# Patient Record
Sex: Female | Born: 1945
Health system: Southern US, Community
[De-identification: ages and names within clinical notes are randomized; demographics above are authoritative.]

## PROBLEM LIST (undated history)

## (undated) DIAGNOSIS — Z8489 Family history of other specified conditions: Secondary | ICD-10-CM

## (undated) DIAGNOSIS — H1851 Endothelial corneal dystrophy: Secondary | ICD-10-CM

## (undated) DIAGNOSIS — H18519 Endothelial corneal dystrophy, unspecified eye: Secondary | ICD-10-CM

## (undated) DIAGNOSIS — M1990 Unspecified osteoarthritis, unspecified site: Secondary | ICD-10-CM

## (undated) DIAGNOSIS — E785 Hyperlipidemia, unspecified: Secondary | ICD-10-CM

## (undated) DIAGNOSIS — E039 Hypothyroidism, unspecified: Secondary | ICD-10-CM

## (undated) DIAGNOSIS — R0600 Dyspnea, unspecified: Secondary | ICD-10-CM

## (undated) DIAGNOSIS — K219 Gastro-esophageal reflux disease without esophagitis: Secondary | ICD-10-CM

## (undated) DIAGNOSIS — J449 Chronic obstructive pulmonary disease, unspecified: Secondary | ICD-10-CM

## (undated) DIAGNOSIS — I10 Essential (primary) hypertension: Secondary | ICD-10-CM

## (undated) DIAGNOSIS — J189 Pneumonia, unspecified organism: Secondary | ICD-10-CM

## (undated) DIAGNOSIS — N189 Chronic kidney disease, unspecified: Secondary | ICD-10-CM

## (undated) DIAGNOSIS — Z9889 Other specified postprocedural states: Secondary | ICD-10-CM

## (undated) DIAGNOSIS — R112 Nausea with vomiting, unspecified: Secondary | ICD-10-CM

## (undated) HISTORY — PX: APPENDECTOMY: SHX54

## (undated) HISTORY — DX: Chronic kidney disease, unspecified: N18.9

## (undated) HISTORY — PX: EYE SURGERY: SHX253

## (undated) HISTORY — PX: ABDOMINAL HYSTERECTOMY: SHX81

## (undated) HISTORY — PX: FOOT SURGERY: SHX648

## (undated) HISTORY — PX: OTHER SURGICAL HISTORY: SHX169

## (undated) HISTORY — DX: Chronic obstructive pulmonary disease, unspecified: J44.9

## (undated) HISTORY — PX: CHOLECYSTECTOMY: SHX55

## (undated) HISTORY — DX: Essential (primary) hypertension: I10

## (undated) HISTORY — DX: Hyperlipidemia, unspecified: E78.5

## (undated) HISTORY — DX: Endothelial corneal dystrophy: H18.51

## (undated) HISTORY — DX: Endothelial corneal dystrophy, unspecified eye: H18.519

## (undated) HISTORY — PX: TONSILLECTOMY: SUR1361

## (undated) HISTORY — PX: CARDIAC CATHETERIZATION: SHX172

## (undated) HISTORY — PX: NASAL SINUS SURGERY: SHX719

---

## 2006-10-06 ENCOUNTER — Ambulatory Visit: Payer: Self-pay | Admitting: Family Medicine

## 2007-04-02 ENCOUNTER — Other Ambulatory Visit: Payer: Self-pay

## 2007-04-03 ENCOUNTER — Inpatient Hospital Stay: Payer: Self-pay | Admitting: Surgery

## 2008-10-15 ENCOUNTER — Ambulatory Visit: Payer: Self-pay

## 2009-07-09 ENCOUNTER — Ambulatory Visit: Payer: Self-pay | Admitting: Ophthalmology

## 2009-08-06 ENCOUNTER — Ambulatory Visit: Payer: Self-pay | Admitting: Family Medicine

## 2010-12-22 ENCOUNTER — Ambulatory Visit: Payer: Self-pay | Admitting: Family Medicine

## 2011-11-03 ENCOUNTER — Ambulatory Visit: Payer: Self-pay | Admitting: Unknown Physician Specialty

## 2011-11-08 LAB — PATHOLOGY REPORT

## 2012-07-05 ENCOUNTER — Ambulatory Visit: Payer: Self-pay | Admitting: Ophthalmology

## 2012-11-02 DIAGNOSIS — J301 Allergic rhinitis due to pollen: Secondary | ICD-10-CM | POA: Diagnosis not present

## 2012-11-16 DIAGNOSIS — R7989 Other specified abnormal findings of blood chemistry: Secondary | ICD-10-CM | POA: Diagnosis not present

## 2013-03-13 DIAGNOSIS — I10 Essential (primary) hypertension: Secondary | ICD-10-CM | POA: Diagnosis not present

## 2013-03-13 DIAGNOSIS — E7800 Pure hypercholesterolemia, unspecified: Secondary | ICD-10-CM | POA: Diagnosis not present

## 2013-03-13 DIAGNOSIS — M810 Age-related osteoporosis without current pathological fracture: Secondary | ICD-10-CM | POA: Diagnosis not present

## 2013-03-13 DIAGNOSIS — J449 Chronic obstructive pulmonary disease, unspecified: Secondary | ICD-10-CM | POA: Diagnosis not present

## 2013-03-13 DIAGNOSIS — Z23 Encounter for immunization: Secondary | ICD-10-CM | POA: Diagnosis not present

## 2013-03-13 DIAGNOSIS — Z0000 Encounter for general adult medical examination without abnormal findings: Secondary | ICD-10-CM | POA: Diagnosis not present

## 2013-03-19 ENCOUNTER — Ambulatory Visit: Payer: Self-pay | Admitting: Family Medicine

## 2013-03-19 DIAGNOSIS — Z136 Encounter for screening for cardiovascular disorders: Secondary | ICD-10-CM | POA: Diagnosis not present

## 2013-04-25 DIAGNOSIS — E785 Hyperlipidemia, unspecified: Secondary | ICD-10-CM | POA: Diagnosis not present

## 2013-04-25 DIAGNOSIS — E7800 Pure hypercholesterolemia, unspecified: Secondary | ICD-10-CM | POA: Diagnosis not present

## 2013-04-25 DIAGNOSIS — I10 Essential (primary) hypertension: Secondary | ICD-10-CM | POA: Diagnosis not present

## 2013-04-26 DIAGNOSIS — I10 Essential (primary) hypertension: Secondary | ICD-10-CM | POA: Diagnosis not present

## 2013-05-17 DIAGNOSIS — N183 Chronic kidney disease, stage 3 unspecified: Secondary | ICD-10-CM | POA: Diagnosis not present

## 2013-06-04 DIAGNOSIS — E785 Hyperlipidemia, unspecified: Secondary | ICD-10-CM | POA: Diagnosis not present

## 2013-06-04 DIAGNOSIS — E7800 Pure hypercholesterolemia, unspecified: Secondary | ICD-10-CM | POA: Diagnosis not present

## 2013-06-04 DIAGNOSIS — N183 Chronic kidney disease, stage 3 unspecified: Secondary | ICD-10-CM | POA: Diagnosis not present

## 2013-06-04 DIAGNOSIS — I10 Essential (primary) hypertension: Secondary | ICD-10-CM | POA: Diagnosis not present

## 2013-06-28 DIAGNOSIS — I10 Essential (primary) hypertension: Secondary | ICD-10-CM | POA: Diagnosis not present

## 2013-07-04 DIAGNOSIS — J018 Other acute sinusitis: Secondary | ICD-10-CM | POA: Diagnosis not present

## 2013-07-04 DIAGNOSIS — J301 Allergic rhinitis due to pollen: Secondary | ICD-10-CM | POA: Diagnosis not present

## 2013-07-16 DIAGNOSIS — N390 Urinary tract infection, site not specified: Secondary | ICD-10-CM | POA: Diagnosis not present

## 2013-07-16 DIAGNOSIS — D1800 Hemangioma unspecified site: Secondary | ICD-10-CM | POA: Diagnosis not present

## 2013-07-16 DIAGNOSIS — L719 Rosacea, unspecified: Secondary | ICD-10-CM | POA: Diagnosis not present

## 2013-07-16 DIAGNOSIS — L819 Disorder of pigmentation, unspecified: Secondary | ICD-10-CM | POA: Diagnosis not present

## 2013-07-16 DIAGNOSIS — L919 Hypertrophic disorder of the skin, unspecified: Secondary | ICD-10-CM | POA: Diagnosis not present

## 2013-07-16 DIAGNOSIS — L821 Other seborrheic keratosis: Secondary | ICD-10-CM | POA: Diagnosis not present

## 2013-07-16 DIAGNOSIS — B351 Tinea unguium: Secondary | ICD-10-CM | POA: Diagnosis not present

## 2013-07-16 DIAGNOSIS — L909 Atrophic disorder of skin, unspecified: Secondary | ICD-10-CM | POA: Diagnosis not present

## 2013-07-16 DIAGNOSIS — D692 Other nonthrombocytopenic purpura: Secondary | ICD-10-CM | POA: Diagnosis not present

## 2013-08-29 DIAGNOSIS — N952 Postmenopausal atrophic vaginitis: Secondary | ICD-10-CM | POA: Diagnosis not present

## 2013-08-29 DIAGNOSIS — Z01419 Encounter for gynecological examination (general) (routine) without abnormal findings: Secondary | ICD-10-CM | POA: Diagnosis not present

## 2013-08-29 DIAGNOSIS — Z1211 Encounter for screening for malignant neoplasm of colon: Secondary | ICD-10-CM | POA: Diagnosis not present

## 2013-08-29 DIAGNOSIS — R87615 Unsatisfactory cytologic smear of cervix: Secondary | ICD-10-CM | POA: Diagnosis not present

## 2013-08-29 DIAGNOSIS — N951 Menopausal and female climacteric states: Secondary | ICD-10-CM | POA: Diagnosis not present

## 2013-09-03 DIAGNOSIS — Z1231 Encounter for screening mammogram for malignant neoplasm of breast: Secondary | ICD-10-CM | POA: Diagnosis not present

## 2013-09-13 DIAGNOSIS — N183 Chronic kidney disease, stage 3 unspecified: Secondary | ICD-10-CM | POA: Diagnosis not present

## 2013-09-13 DIAGNOSIS — I10 Essential (primary) hypertension: Secondary | ICD-10-CM | POA: Diagnosis not present

## 2013-10-16 DIAGNOSIS — I10 Essential (primary) hypertension: Secondary | ICD-10-CM | POA: Diagnosis not present

## 2013-11-08 DIAGNOSIS — J305 Allergic rhinitis due to food: Secondary | ICD-10-CM | POA: Diagnosis not present

## 2013-11-08 DIAGNOSIS — J301 Allergic rhinitis due to pollen: Secondary | ICD-10-CM | POA: Diagnosis not present

## 2014-01-02 DIAGNOSIS — N183 Chronic kidney disease, stage 3 unspecified: Secondary | ICD-10-CM | POA: Diagnosis not present

## 2014-01-02 DIAGNOSIS — I10 Essential (primary) hypertension: Secondary | ICD-10-CM | POA: Diagnosis not present

## 2014-01-17 DIAGNOSIS — R059 Cough, unspecified: Secondary | ICD-10-CM | POA: Diagnosis not present

## 2014-01-17 DIAGNOSIS — J305 Allergic rhinitis due to food: Secondary | ICD-10-CM | POA: Diagnosis not present

## 2014-01-17 DIAGNOSIS — R05 Cough: Secondary | ICD-10-CM | POA: Diagnosis not present

## 2014-01-17 DIAGNOSIS — J328 Other chronic sinusitis: Secondary | ICD-10-CM | POA: Diagnosis not present

## 2014-02-13 DIAGNOSIS — Z23 Encounter for immunization: Secondary | ICD-10-CM | POA: Diagnosis not present

## 2014-02-21 DIAGNOSIS — I10 Essential (primary) hypertension: Secondary | ICD-10-CM | POA: Diagnosis not present

## 2014-02-28 DIAGNOSIS — Z23 Encounter for immunization: Secondary | ICD-10-CM | POA: Diagnosis not present

## 2014-03-04 DIAGNOSIS — H26492 Other secondary cataract, left eye: Secondary | ICD-10-CM | POA: Diagnosis not present

## 2014-03-21 DIAGNOSIS — I10 Essential (primary) hypertension: Secondary | ICD-10-CM | POA: Diagnosis not present

## 2014-03-21 DIAGNOSIS — N183 Chronic kidney disease, stage 3 (moderate): Secondary | ICD-10-CM | POA: Diagnosis not present

## 2014-03-21 DIAGNOSIS — E039 Hypothyroidism, unspecified: Secondary | ICD-10-CM | POA: Diagnosis not present

## 2014-03-21 DIAGNOSIS — Z0000 Encounter for general adult medical examination without abnormal findings: Secondary | ICD-10-CM | POA: Diagnosis not present

## 2014-03-21 DIAGNOSIS — E785 Hyperlipidemia, unspecified: Secondary | ICD-10-CM | POA: Diagnosis not present

## 2014-03-25 DIAGNOSIS — E2839 Other primary ovarian failure: Secondary | ICD-10-CM | POA: Diagnosis not present

## 2014-03-25 DIAGNOSIS — M8588 Other specified disorders of bone density and structure, other site: Secondary | ICD-10-CM | POA: Diagnosis not present

## 2014-03-25 DIAGNOSIS — N958 Other specified menopausal and perimenopausal disorders: Secondary | ICD-10-CM | POA: Diagnosis not present

## 2014-03-25 DIAGNOSIS — Z13820 Encounter for screening for osteoporosis: Secondary | ICD-10-CM | POA: Diagnosis not present

## 2014-04-01 DIAGNOSIS — I10 Essential (primary) hypertension: Secondary | ICD-10-CM | POA: Diagnosis not present

## 2014-04-01 DIAGNOSIS — M549 Dorsalgia, unspecified: Secondary | ICD-10-CM | POA: Diagnosis not present

## 2014-04-09 DIAGNOSIS — J0101 Acute recurrent maxillary sinusitis: Secondary | ICD-10-CM | POA: Diagnosis not present

## 2014-04-24 DIAGNOSIS — E785 Hyperlipidemia, unspecified: Secondary | ICD-10-CM | POA: Diagnosis not present

## 2014-04-24 DIAGNOSIS — I10 Essential (primary) hypertension: Secondary | ICD-10-CM | POA: Diagnosis not present

## 2014-04-24 DIAGNOSIS — N183 Chronic kidney disease, stage 3 (moderate): Secondary | ICD-10-CM | POA: Diagnosis not present

## 2014-04-26 DIAGNOSIS — B379 Candidiasis, unspecified: Secondary | ICD-10-CM | POA: Diagnosis not present

## 2014-04-29 DIAGNOSIS — M545 Low back pain: Secondary | ICD-10-CM | POA: Diagnosis not present

## 2014-05-02 DIAGNOSIS — M1000 Idiopathic gout, unspecified site: Secondary | ICD-10-CM | POA: Diagnosis not present

## 2014-06-10 DIAGNOSIS — H26492 Other secondary cataract, left eye: Secondary | ICD-10-CM | POA: Diagnosis not present

## 2014-06-24 DIAGNOSIS — Z1211 Encounter for screening for malignant neoplasm of colon: Secondary | ICD-10-CM | POA: Diagnosis not present

## 2014-06-26 DIAGNOSIS — N183 Chronic kidney disease, stage 3 (moderate): Secondary | ICD-10-CM | POA: Diagnosis not present

## 2014-06-26 DIAGNOSIS — M545 Low back pain: Secondary | ICD-10-CM | POA: Diagnosis not present

## 2014-06-26 DIAGNOSIS — I10 Essential (primary) hypertension: Secondary | ICD-10-CM | POA: Diagnosis not present

## 2014-07-22 DIAGNOSIS — I789 Disease of capillaries, unspecified: Secondary | ICD-10-CM | POA: Diagnosis not present

## 2014-07-22 DIAGNOSIS — L219 Seborrheic dermatitis, unspecified: Secondary | ICD-10-CM | POA: Diagnosis not present

## 2014-07-22 DIAGNOSIS — L821 Other seborrheic keratosis: Secondary | ICD-10-CM | POA: Diagnosis not present

## 2014-07-22 DIAGNOSIS — L859 Epidermal thickening, unspecified: Secondary | ICD-10-CM | POA: Diagnosis not present

## 2014-07-22 DIAGNOSIS — L820 Inflamed seborrheic keratosis: Secondary | ICD-10-CM | POA: Diagnosis not present

## 2014-07-22 DIAGNOSIS — L718 Other rosacea: Secondary | ICD-10-CM | POA: Diagnosis not present

## 2014-07-22 DIAGNOSIS — Z1283 Encounter for screening for malignant neoplasm of skin: Secondary | ICD-10-CM | POA: Diagnosis not present

## 2014-07-22 DIAGNOSIS — D1800 Hemangioma unspecified site: Secondary | ICD-10-CM | POA: Diagnosis not present

## 2014-07-22 DIAGNOSIS — B351 Tinea unguium: Secondary | ICD-10-CM | POA: Diagnosis not present

## 2014-09-04 DIAGNOSIS — Z01419 Encounter for gynecological examination (general) (routine) without abnormal findings: Secondary | ICD-10-CM | POA: Diagnosis not present

## 2014-09-04 DIAGNOSIS — N951 Menopausal and female climacteric states: Secondary | ICD-10-CM | POA: Diagnosis not present

## 2014-09-04 DIAGNOSIS — M810 Age-related osteoporosis without current pathological fracture: Secondary | ICD-10-CM | POA: Diagnosis not present

## 2014-09-04 DIAGNOSIS — N952 Postmenopausal atrophic vaginitis: Secondary | ICD-10-CM | POA: Diagnosis not present

## 2014-09-06 ENCOUNTER — Other Ambulatory Visit: Payer: Self-pay | Admitting: Obstetrics and Gynecology

## 2014-09-06 DIAGNOSIS — J301 Allergic rhinitis due to pollen: Secondary | ICD-10-CM | POA: Diagnosis not present

## 2014-09-06 DIAGNOSIS — R490 Dysphonia: Secondary | ICD-10-CM | POA: Diagnosis not present

## 2014-09-06 DIAGNOSIS — J060 Acute laryngopharyngitis: Secondary | ICD-10-CM | POA: Diagnosis not present

## 2014-09-06 DIAGNOSIS — Z01419 Encounter for gynecological examination (general) (routine) without abnormal findings: Secondary | ICD-10-CM

## 2014-09-10 DIAGNOSIS — Z1231 Encounter for screening mammogram for malignant neoplasm of breast: Secondary | ICD-10-CM | POA: Diagnosis not present

## 2014-09-12 DIAGNOSIS — I10 Essential (primary) hypertension: Secondary | ICD-10-CM | POA: Diagnosis not present

## 2014-09-12 DIAGNOSIS — M545 Low back pain: Secondary | ICD-10-CM | POA: Diagnosis not present

## 2014-09-12 DIAGNOSIS — E039 Hypothyroidism, unspecified: Secondary | ICD-10-CM | POA: Diagnosis not present

## 2014-09-12 DIAGNOSIS — E785 Hyperlipidemia, unspecified: Secondary | ICD-10-CM | POA: Diagnosis not present

## 2014-09-12 DIAGNOSIS — N183 Chronic kidney disease, stage 3 (moderate): Secondary | ICD-10-CM | POA: Diagnosis not present

## 2014-10-09 DIAGNOSIS — M79672 Pain in left foot: Secondary | ICD-10-CM | POA: Diagnosis not present

## 2014-10-09 DIAGNOSIS — M216X2 Other acquired deformities of left foot: Secondary | ICD-10-CM | POA: Diagnosis not present

## 2014-10-09 DIAGNOSIS — M2012 Hallux valgus (acquired), left foot: Secondary | ICD-10-CM | POA: Diagnosis not present

## 2014-10-23 DIAGNOSIS — I10 Essential (primary) hypertension: Secondary | ICD-10-CM | POA: Diagnosis not present

## 2014-10-23 DIAGNOSIS — N183 Chronic kidney disease, stage 3 (moderate): Secondary | ICD-10-CM | POA: Diagnosis not present

## 2014-10-23 DIAGNOSIS — E780 Pure hypercholesterolemia: Secondary | ICD-10-CM | POA: Diagnosis not present

## 2014-11-05 ENCOUNTER — Telehealth: Payer: Self-pay | Admitting: Family Medicine

## 2014-11-05 DIAGNOSIS — N390 Urinary tract infection, site not specified: Secondary | ICD-10-CM

## 2014-11-05 MED ORDER — CIPROFLOXACIN HCL 250 MG PO TABS: 250.0000 mg | ORAL_TABLET | Freq: Two times a day (BID) | ORAL | Status: DC

## 2014-11-05 NOTE — Telephone Encounter (Signed)
rx sent in 

## 2014-11-05 NOTE — Telephone Encounter (Signed)
PT CALLED IN THINKS SHE HAS UTI AND WOULD LIKE A CALL BACK

## 2014-11-06 ENCOUNTER — Telehealth: Payer: Self-pay | Admitting: Family Medicine

## 2014-11-06 MED ORDER — FLUCONAZOLE 150 MG PO TABS: 150.0000 mg | ORAL_TABLET | Freq: Once | ORAL | Status: DC

## 2014-11-06 NOTE — Telephone Encounter (Signed)
Add on diflucan to rx

## 2014-11-06 NOTE — Telephone Encounter (Signed)
Pt called would like a call back from Dr. Jeananne Rama. Please call her @ [phone]. Thanks.

## 2014-11-19 ENCOUNTER — Telehealth: Payer: Self-pay

## 2014-11-19 NOTE — Telephone Encounter (Signed)
[facility] requesting Levothyroxine 61mcg

## 2014-11-20 MED ORDER — LEVOTHYROXINE SODIUM 75 MCG PO TABS: 75.0000 ug | ORAL_TABLET | Freq: Every day | ORAL | Status: DC

## 2014-11-26 DIAGNOSIS — N183 Chronic kidney disease, stage 3 unspecified: Secondary | ICD-10-CM | POA: Insufficient documentation

## 2014-11-26 DIAGNOSIS — I10 Essential (primary) hypertension: Secondary | ICD-10-CM | POA: Insufficient documentation

## 2014-11-26 DIAGNOSIS — H1851 Endothelial corneal dystrophy: Secondary | ICD-10-CM

## 2014-11-26 DIAGNOSIS — M545 Low back pain, unspecified: Secondary | ICD-10-CM | POA: Insufficient documentation

## 2014-11-26 DIAGNOSIS — E785 Hyperlipidemia, unspecified: Secondary | ICD-10-CM | POA: Insufficient documentation

## 2014-11-26 DIAGNOSIS — E039 Hypothyroidism, unspecified: Secondary | ICD-10-CM | POA: Insufficient documentation

## 2014-11-26 DIAGNOSIS — H18519 Endothelial corneal dystrophy, unspecified eye: Secondary | ICD-10-CM

## 2014-11-26 DIAGNOSIS — J449 Chronic obstructive pulmonary disease, unspecified: Secondary | ICD-10-CM | POA: Insufficient documentation

## 2014-11-26 DIAGNOSIS — I129 Hypertensive chronic kidney disease with stage 1 through stage 4 chronic kidney disease, or unspecified chronic kidney disease: Secondary | ICD-10-CM | POA: Insufficient documentation

## 2014-11-26 DIAGNOSIS — M810 Age-related osteoporosis without current pathological fracture: Secondary | ICD-10-CM | POA: Insufficient documentation

## 2014-11-27 ENCOUNTER — Encounter: Payer: Self-pay | Admitting: Unknown Physician Specialty

## 2014-11-27 ENCOUNTER — Ambulatory Visit (INDEPENDENT_AMBULATORY_CARE_PROVIDER_SITE_OTHER): Payer: Medicare Other | Admitting: Unknown Physician Specialty

## 2014-11-27 VITALS — BP 136/80 | HR 63 | Temp 98.2°F | Ht 64.3 in | Wt 153.6 lb

## 2014-11-27 DIAGNOSIS — M5430 Sciatica, unspecified side: Secondary | ICD-10-CM | POA: Insufficient documentation

## 2014-11-27 DIAGNOSIS — N390 Urinary tract infection, site not specified: Secondary | ICD-10-CM

## 2014-11-27 DIAGNOSIS — R300 Dysuria: Secondary | ICD-10-CM | POA: Diagnosis not present

## 2014-11-27 DIAGNOSIS — M65872 Other synovitis and tenosynovitis, left ankle and foot: Secondary | ICD-10-CM | POA: Diagnosis not present

## 2014-11-27 DIAGNOSIS — M2012 Hallux valgus (acquired), left foot: Secondary | ICD-10-CM | POA: Diagnosis not present

## 2014-11-27 DIAGNOSIS — M79672 Pain in left foot: Secondary | ICD-10-CM | POA: Diagnosis not present

## 2014-11-27 LAB — UA/M W/RFLX CULTURE, ROUTINE
Bilirubin, UA: NEGATIVE
Glucose, UA: NEGATIVE
Ketones, UA: NEGATIVE
Leukocytes, UA: NEGATIVE
Nitrite, UA: NEGATIVE
Protein, UA: NEGATIVE
RBC, UA: NEGATIVE
Specific Gravity, UA: 1.015 (ref 1.005–1.030)
Urobilinogen, Ur: 0.2 mg/dL (ref 0.2–1.0)
pH, UA: 6 (ref 5.0–7.5)

## 2014-11-27 MED ORDER — FLUCONAZOLE 150 MG PO TABS: 150.0000 mg | ORAL_TABLET | Freq: Once | ORAL | Status: DC

## 2014-11-27 NOTE — Progress Notes (Addendum)
BP 136/80 mmHg  Pulse 63  Temp(Src) 98.2 F (36.8 C)  Ht 5' 4.3" (1.633 m)  Wt 153 lb 9.6 oz (69.673 kg)  BMI 26.13 kg/m2  SpO2 98%  LMP  (LMP Unknown)   Subjective:    Patient ID: Julia Deleon, female    DOB: [birth year], 69 y.o.   MRN: [id number]  HPI: Dewanda C [place] is a 69 y.o. female  Chief Complaint  Patient presents with  . Urinary Tract Infection    pt states Dr. Jeananne Rama gave her something for a UTI a few weeks ago but pt states "I just don't feel right"    Pt was going to the beach and Dr. Jeananne Rama gave her 1 week of Cipro.  In addition, she was given Cefuroxime 250 mg twice a day for 10 days.    She states her urinary tract doesn't "seem to be right."  She feels pressure that comes and goes.  She states she has a short bladder stem and her bladder doesn't completely empty.  She wonders why she is having all these problems and wonders if she is drinking enough.    Relevant past medical, surgical, family and social history reviewed and updated as indicated. Interim medical history since our last visit reviewed. Allergies and medications reviewed and updated.  Review of Systems  Per HPI unless specifically indicated above     Objective:    BP 136/80 mmHg  Pulse 63  Temp(Src) 98.2 F (36.8 C)  Ht 5' 4.3" (1.633 m)  Wt 153 lb 9.6 oz (69.673 kg)  BMI 26.13 kg/m2  SpO2 98%  LMP  (LMP Unknown)  Wt Readings from Last 3 Encounters:  11/27/14 153 lb 9.6 oz (69.673 kg)  09/12/14 154 lb (69.854 kg)    Physical Exam  Constitutional: She is oriented to person, place, and time. She appears well-developed and well-nourished. No distress.  HENT:  Head: Normocephalic and atraumatic.  Eyes: Conjunctivae and lids are normal. Right eye exhibits no discharge. Left eye exhibits no discharge. No scleral icterus.  Cardiovascular: Normal rate and regular rhythm.   Pulmonary/Chest: Effort normal and breath sounds normal. No respiratory distress.  Abdominal: Soft. Normal appearance  and bowel sounds are normal. She exhibits no distension. There is no splenomegaly or hepatomegaly. There is no tenderness.  Genitourinary:  States she refuses a pelvic exam as it always gives her an infection.    Musculoskeletal: Normal range of motion.  Neurological: She is alert and oriented to person, place, and time.  Skin: Skin is intact. No rash noted. No pallor.  Psychiatric: She has a normal mood and affect. Her behavior is normal. Judgment and thought content normal.    Assessment & Plan:   Problem List Items Addressed This Visit    None    Visit Diagnoses    Urinary tract infection without hematuria, site unspecified    -  Primary    Relevant Medications    cefUROXime (CEFTIN) 250 MG tablet    Other Relevant Orders    UA/M w/rflx Culture, Routine (Completed)    Dysuria        Discussed with pt that she will drink more water.  If no improvment, refer to urology.  Consider pelvic US.  Refusing both at this time.          Follow up plan: Return in about 4 weeks (around 12/25/2014) for to recheck urine and assess symptoms.   Would like a rx for Diflucan and also wants  it to be known she still has sciatica but not requesting treatment at this time.  Problem list has been updated.

## 2014-11-27 NOTE — Addendum Note (Signed)
Addended by: Kathrine Haddock on: 11/27/2014 11:46 AM   Modules accepted: Orders, SmartSet

## 2014-12-25 ENCOUNTER — Encounter: Payer: Self-pay | Admitting: Unknown Physician Specialty

## 2014-12-25 ENCOUNTER — Ambulatory Visit (INDEPENDENT_AMBULATORY_CARE_PROVIDER_SITE_OTHER): Payer: Medicare Other | Admitting: Unknown Physician Specialty

## 2014-12-25 VITALS — BP 145/79 | HR 60 | Temp 97.6°F | Ht 63.7 in | Wt 153.0 lb

## 2014-12-25 DIAGNOSIS — R300 Dysuria: Secondary | ICD-10-CM

## 2014-12-25 DIAGNOSIS — M545 Low back pain: Secondary | ICD-10-CM

## 2014-12-25 DIAGNOSIS — N183 Chronic kidney disease, stage 3 unspecified: Secondary | ICD-10-CM

## 2014-12-25 DIAGNOSIS — I10 Essential (primary) hypertension: Secondary | ICD-10-CM | POA: Diagnosis not present

## 2014-12-25 LAB — MICROALBUMIN, URINE WAIVED
Creatinine, Urine Waived: 100 mg/dL (ref 10–300)
Microalb, Ur Waived: 10 mg/L (ref 0–19)
Microalb/Creat Ratio: <30 mg/g (ref <30)

## 2014-12-25 NOTE — Assessment & Plan Note (Signed)
Partially resolved but seems to be a chronic problem

## 2014-12-25 NOTE — Assessment & Plan Note (Signed)
Discussed yoga, increasing exercise, muscle relaxant at bedtime.

## 2014-12-25 NOTE — Progress Notes (Signed)
   BP 145/79 mmHg  Pulse 60  Temp(Src) 97.6 F (36.4 C)  Ht 5' 3.7" (1.618 m)  Wt 153 lb (69.4 kg)  BMI 26.51 kg/m2  SpO2 98%  LMP  (LMP Unknown)   Subjective:    Patient ID: Julia Deleon, female    DOB: [birth year], 69 y.o.   MRN: [id number]  HPI: Julia Deleon is a 69 y.o. female  Pt here with several concerns:  Frequent UTIs Saw her last  Visit and checking if urinary symptoms improved once she was off medications for her sinus infection.  Her urine was clean and she states that now she feels much better.    Back pain Chronic back pain that seems to be terrible by mid day.  Sees a Restaurant manager, fast food.  Tries to go to the gym twice a week.  Has been to massage therapy.  Brought in x-rays from chiropractor showing some DDD.    CKD Attributes this to inflammation and high blood pressure due to stress.  She wants a blood test to see if her kidney function is better.  Wonders if her supplements of Tumeric and B2 are safe.    Relevant past medical, surgical, family and social history reviewed and updated as indicated. Interim medical history since our last visit reviewed. Allergies and medications reviewed and updated.  Review of Systems  All other systems reviewed and are negative.   Per HPI unless specifically indicated above     Objective:    BP 145/79 mmHg  Pulse 60  Temp(Src) 97.6 F (36.4 C)  Ht 5' 3.7" (1.618 m)  Wt 153 lb (69.4 kg)  BMI 26.51 kg/m2  SpO2 98%  LMP  (LMP Unknown)  Wt Readings from Last 3 Encounters:  12/25/14 153 lb (69.4 kg)  11/27/14 153 lb 9.6 oz (69.673 kg)  09/12/14 154 lb (69.854 kg)    Physical Exam  Constitutional: She is oriented to person, place, and time. She appears well-developed and well-nourished. No distress.  HENT:  Head: Normocephalic and atraumatic.  Eyes: Conjunctivae and lids are normal. Right eye exhibits no discharge. Left eye exhibits no discharge. No scleral icterus.  Cardiovascular: Normal rate, regular rhythm and normal  heart sounds.   Pulmonary/Chest: Effort normal and breath sounds normal. No respiratory distress.  Abdominal: Normal appearance. There is no splenomegaly or hepatomegaly.  Musculoskeletal: Normal range of motion.  Neurological: She is alert and oriented to person, place, and time.  Skin: Skin is intact. No rash noted. No pallor.  Psychiatric: She has a normal mood and affect. Her behavior is normal. Judgment and thought content normal.       Assessment & Plan:   Problem List Items Addressed This Visit      Unprioritized   Hypertension - Primary   Relevant Orders   Comprehensive metabolic panel   Microalbumin, Urine Waived   CKD (chronic kidney disease), stage III   Relevant Orders   Comprehensive metabolic panel   Microalbumin, Urine Waived   Low back pain    Discussed yoga, increasing exercise, muscle relaxant at bedtime.        Dysuria    Partially resolved but seems to be a chronic problem          Follow up plan: No Follow-up on file.

## 2014-12-26 ENCOUNTER — Encounter: Payer: Self-pay | Admitting: Unknown Physician Specialty

## 2014-12-26 LAB — COMPREHENSIVE METABOLIC PANEL
ALT: 21 IU/L (ref 0–32)
AST: 16 IU/L (ref 0–40)
Albumin/Globulin Ratio: 2 (ref 1.1–2.5)
Albumin: 4.2 g/dL (ref 3.6–4.8)
Alkaline Phosphatase: 52 IU/L (ref 39–117)
BUN/Creatinine Ratio: 17 (ref 11–26)
BUN: 19 mg/dL (ref 8–27)
Bilirubin Total: 0.6 mg/dL (ref 0.0–1.2)
CO2: 25 mmol/L (ref 18–29)
Calcium: 9.2 mg/dL (ref 8.7–10.3)
Chloride: 99 mmol/L (ref 97–108)
Creatinine, Ser: 1.14 mg/dL — ABNORMAL HIGH (ref 0.57–1.00)
GFR calc Af Amer: 57 mL/min/{1.73_m2} — ABNORMAL LOW (ref >59)
GFR calc non Af Amer: 50 mL/min/{1.73_m2} — ABNORMAL LOW (ref >59)
Globulin, Total: 2.1 g/dL (ref 1.5–4.5)
Glucose: 75 mg/dL (ref 65–99)
Potassium: 4.2 mmol/L (ref 3.5–5.2)
Sodium: 139 mmol/L (ref 134–144)
Total Protein: 6.3 g/dL (ref 6.0–8.5)

## 2014-12-30 ENCOUNTER — Telehealth: Payer: Self-pay

## 2014-12-30 MED ORDER — CHLORZOXAZONE 500 MG PO TABS: 250.0000 mg | ORAL_TABLET | Freq: Two times a day (BID) | ORAL | Status: DC | PRN

## 2014-12-30 NOTE — Telephone Encounter (Signed)
Requesting Chlorzoxazone 500mg  Tab Take 1/2 - 1 Tab twic daily as needed

## 2015-01-01 DIAGNOSIS — M2012 Hallux valgus (acquired), left foot: Secondary | ICD-10-CM | POA: Diagnosis not present

## 2015-01-01 DIAGNOSIS — M65872 Other synovitis and tenosynovitis, left ankle and foot: Secondary | ICD-10-CM | POA: Diagnosis not present

## 2015-01-02 ENCOUNTER — Other Ambulatory Visit: Payer: Self-pay | Admitting: Family Medicine

## 2015-01-02 MED ORDER — CYCLOBENZAPRINE HCL 5 MG PO TABS: 5.0000 mg | ORAL_TABLET | Freq: Three times a day (TID) | ORAL | Status: DC | PRN

## 2015-02-06 ENCOUNTER — Telehealth: Payer: Self-pay | Admitting: Family Medicine

## 2015-02-06 ENCOUNTER — Other Ambulatory Visit: Payer: Self-pay | Admitting: Family Medicine

## 2015-02-06 MED ORDER — METOPROLOL SUCCINATE ER 50 MG PO TB24: 50.0000 mg | ORAL_TABLET | Freq: Every day | ORAL | Status: DC

## 2015-02-06 NOTE — Telephone Encounter (Signed)
Pt called stated she believes her BP is back up again. Would like a call back from [place]. Thanks.

## 2015-02-26 ENCOUNTER — Encounter: Payer: Self-pay | Admitting: Family Medicine

## 2015-02-26 ENCOUNTER — Ambulatory Visit (INDEPENDENT_AMBULATORY_CARE_PROVIDER_SITE_OTHER): Payer: Medicare Other | Admitting: Family Medicine

## 2015-02-26 VITALS — BP 105/62 | HR 53 | Temp 98.0°F | Ht 64.0 in | Wt 151.0 lb

## 2015-02-26 DIAGNOSIS — J3089 Other allergic rhinitis: Secondary | ICD-10-CM | POA: Insufficient documentation

## 2015-02-26 DIAGNOSIS — M7551 Bursitis of right shoulder: Secondary | ICD-10-CM | POA: Insufficient documentation

## 2015-02-26 DIAGNOSIS — I10 Essential (primary) hypertension: Secondary | ICD-10-CM | POA: Diagnosis not present
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
## 2015-02-26 NOTE — Assessment & Plan Note (Signed)
Discussed care and treatment of right shoulder avoiding nonsteroidal agents will do home physical therapy if unsuccessful will refer to physical therapy.

## 2015-02-26 NOTE — Assessment & Plan Note (Signed)
The current medical regimen is effective;  continue present plan and medications.  

## 2015-03-13 DIAGNOSIS — Z961 Presence of intraocular lens: Secondary | ICD-10-CM | POA: Diagnosis not present

## 2015-03-25 ENCOUNTER — Ambulatory Visit (INDEPENDENT_AMBULATORY_CARE_PROVIDER_SITE_OTHER): Payer: Medicare Other | Admitting: Family Medicine

## 2015-03-25 ENCOUNTER — Encounter: Payer: Self-pay | Admitting: Family Medicine

## 2015-03-25 VITALS — BP 136/63 | HR 53 | Temp 97.8°F | Ht 64.2 in | Wt 152.0 lb

## 2015-03-25 DIAGNOSIS — Z0000 Encounter for general adult medical examination without abnormal findings: Secondary | ICD-10-CM

## 2015-03-25 DIAGNOSIS — Z23 Encounter for immunization: Secondary | ICD-10-CM | POA: Diagnosis not present

## 2015-03-25 DIAGNOSIS — E038 Other specified hypothyroidism: Secondary | ICD-10-CM

## 2015-03-25 DIAGNOSIS — I10 Essential (primary) hypertension: Secondary | ICD-10-CM | POA: Diagnosis not present

## 2015-03-25 DIAGNOSIS — E785 Hyperlipidemia, unspecified: Secondary | ICD-10-CM

## 2015-03-25 LAB — URINALYSIS, ROUTINE W REFLEX MICROSCOPIC
Bilirubin, UA: NEGATIVE
Glucose, UA: NEGATIVE
Ketones, UA: NEGATIVE
Leukocytes, UA: NEGATIVE
Nitrite, UA: NEGATIVE
Protein, UA: NEGATIVE
RBC, UA: NEGATIVE
Specific Gravity, UA: 1.015 (ref 1.005–1.030)
Urobilinogen, Ur: 0.2 mg/dL (ref 0.2–1.0)
pH, UA: 6.5 (ref 5.0–7.5)

## 2015-03-25 MED ORDER — CYCLOBENZAPRINE HCL 5 MG PO TABS: 5.0000 mg | ORAL_TABLET | Freq: Three times a day (TID) | ORAL | Status: DC | PRN

## 2015-03-25 MED ORDER — LEVOTHYROXINE SODIUM 75 MCG PO TABS: 75.0000 ug | ORAL_TABLET | Freq: Every day | ORAL | Status: DC

## 2015-03-25 MED ORDER — LOSARTAN POTASSIUM 100 MG PO TABS: 100.0000 mg | ORAL_TABLET | Freq: Every day | ORAL | Status: DC

## 2015-03-25 MED ORDER — HYDROCHLOROTHIAZIDE 25 MG PO TABS: 25.0000 mg | ORAL_TABLET | Freq: Every day | ORAL | Status: DC

## 2015-03-25 MED ORDER — AMLODIPINE BESYLATE 5 MG PO TABS: 5.0000 mg | ORAL_TABLET | Freq: Every day | ORAL | Status: DC

## 2015-03-25 MED ORDER — OXYBUTYNIN CHLORIDE ER 15 MG PO TB24: 15.0000 mg | ORAL_TABLET | Freq: Every day | ORAL | Status: DC

## 2015-03-25 NOTE — Progress Notes (Signed)
BP 136/63 mmHg  Pulse 53  Temp(Src) 97.8 F (36.6 C)  Ht 5' 4.2" (1.631 m)  Wt 152 lb (68.947 kg)  BMI 25.92 kg/m2  SpO2 97%  LMP  (LMP Unknown)   Subjective:    Patient ID: Julia Deleon, female    DOB: 12-06-1945, 69 y.o.   MRN: [id number]  HPI: Julia Deleon is a 69 y.o. female  Chief Complaint  Patient presents with  . Annual Exam   patient recheck for hypertension blood pressure doing well but over these last week has developed marked fatigue with metoprolol Shoulder not a lot better does not wake at night Discussed last visit going to physical therapy patient not ready as still too busy. Patient's renal disease stable using supplements moderately.   Relevant past medical, surgical, family and social history reviewed and updated as indicated. Interim medical history since our last visit reviewed. Allergies and medications reviewed and updated.  Review of Systems  Constitutional: Negative.   HENT: Negative.   Eyes: Negative.   Respiratory: Negative.   Cardiovascular: Negative.   Gastrointestinal: Negative.   Endocrine: Negative.   Genitourinary: Negative.   Musculoskeletal: Negative.   Skin: Negative.   Allergic/Immunologic: Negative.   Neurological: Negative.   Hematological: Negative.   Psychiatric/Behavioral: Negative.     Per HPI unless specifically indicated above     Objective:    BP 136/63 mmHg  Pulse 53  Temp(Src) 97.8 F (36.6 C)  Ht 5' 4.2" (1.631 m)  Wt 152 lb (68.947 kg)  BMI 25.92 kg/m2  SpO2 97%  LMP  (LMP Unknown)  Wt Readings from Last 3 Encounters:  03/25/15 152 lb (68.947 kg)  02/26/15 151 lb (68.493 kg)  12/25/14 153 lb (69.4 kg)    Physical Exam  Constitutional: She is oriented to person, place, and time. She appears well-developed and well-nourished.  HENT:  Head: Normocephalic and atraumatic.  Right Ear: External ear normal.  Left Ear: External ear normal.  Nose: Nose normal.  Mouth/Throat: Oropharynx is clear and moist.   Eyes: Conjunctivae and EOM are normal. Pupils are equal, round, and reactive to light.  Neck: Normal range of motion. Neck supple. Carotid bruit is not present.  Cardiovascular: Normal rate, regular rhythm and normal heart sounds.   No murmur heard. Pulmonary/Chest: Effort normal and breath sounds normal. She exhibits no mass. Right breast exhibits no mass, no skin change and no tenderness. Left breast exhibits no mass, no skin change and no tenderness. Breasts are symmetrical.  Abdominal: Soft. Bowel sounds are normal. There is no hepatosplenomegaly.  Musculoskeletal: Normal range of motion.  Neurological: She is alert and oriented to person, place, and time.  Skin: No rash noted.  Psychiatric: She has a normal mood and affect. Her behavior is normal. Judgment and thought content normal.    Results for orders placed or performed in visit on 12/25/14  Comprehensive metabolic panel  Result Value Ref Range   Glucose 75 65 - 99 mg/dL   BUN 19 8 - 27 mg/dL   Creatinine, Ser 1.14 (H) 0.57 - 1.00 mg/dL   GFR calc non Af Amer 50 (L) >59 mL/min/1.73   GFR calc Af Amer 57 (L) >59 mL/min/1.73   BUN/Creatinine Ratio 17 11 - 26   Sodium 139 134 - 144 mmol/L   Potassium 4.2 3.5 - 5.2 mmol/L   Chloride 99 97 - 108 mmol/L   CO2 25 18 - 29 mmol/L   Calcium 9.2 8.7 - 10.3 mg/dL  Total Protein 6.3 6.0 - 8.5 g/dL   Albumin 4.2 3.6 - 4.8 g/dL   Globulin, Total 2.1 1.5 - 4.5 g/dL   Albumin/Globulin Ratio 2.0 1.1 - 2.5   Bilirubin Total 0.6 0.0 - 1.2 mg/dL   Alkaline Phosphatase 52 39 - 117 IU/L   AST 16 0 - 40 IU/L   ALT 21 0 - 32 IU/L  Microalbumin, Urine Waived  Result Value Ref Range   Microalb, Ur Waived 10 0 - 19 mg/L   Creatinine, Urine Waived 100 10 - 300 mg/dL   Microalb/Creat Ratio <30 <30 mg/g      Assessment & Plan:   Problem List Items Addressed This Visit      Cardiovascular and Mediastinum   Hypertension    Discuss hypertension care and treatment With side effects to  metoprolol will discontinue Start hydrochlorothiazide Recheck one month with BMP to assess potassium status and blood pressure      Relevant Medications   amLODipine (NORVASC) 5 MG tablet   losartan (COZAAR) 100 MG tablet   hydrochlorothiazide (HYDRODIURIL) 25 MG tablet   Other Relevant Orders   Comprehensive metabolic panel   Lipid panel   CBC with Differential/Platelet   Urinalysis, Routine w reflex microscopic (not at [facility])   TSH     Endocrine   Hypothyroidism    The current medical regimen is effective;  continue present plan and medications.       Relevant Medications   levothyroxine (SYNTHROID, LEVOTHROID) 75 MCG tablet   Other Relevant Orders   Comprehensive metabolic panel   Lipid panel   CBC with Differential/Platelet   Urinalysis, Routine w reflex microscopic (not at [facility])   TSH     Other   Hyperlipidemia    The current medical regimen is effective;  continue present plan and medications.       Relevant Medications   amLODipine (NORVASC) 5 MG tablet   losartan (COZAAR) 100 MG tablet   hydrochlorothiazide (HYDRODIURIL) 25 MG tablet   Other Relevant Orders   Comprehensive metabolic panel   Lipid panel   CBC with Differential/Platelet   Urinalysis, Routine w reflex microscopic (not at [facility])   TSH    Other Visit Diagnoses    Immunization due    -  Primary    Relevant Orders    Flu Vaccine QUAD 36+ mos PF IM (Fluarix & Fluzone Quad PF) (Completed)    PE (physical exam), annual            Follow up plan: Return in about 4 weeks (around 04/22/2015) for Blood pressure check and BMP.

## 2015-03-25 NOTE — Assessment & Plan Note (Signed)
Discuss hypertension care and treatment With side effects to metoprolol will discontinue Start hydrochlorothiazide Recheck one month with BMP to assess potassium status and blood pressure

## 2015-03-25 NOTE — Assessment & Plan Note (Signed)
The current medical regimen is effective;  continue present plan and medications.  

## 2015-03-26 LAB — LIPID PANEL
Chol/HDL Ratio: 5 ratio units — ABNORMAL HIGH (ref 0.0–4.4)
Cholesterol, Total: 198 mg/dL (ref 100–199)
HDL: 40 mg/dL (ref >39)
LDL Calculated: 137 mg/dL — ABNORMAL HIGH (ref 0–99)
Triglycerides: 107 mg/dL (ref 0–149)
VLDL Cholesterol Cal: 21 mg/dL (ref 5–40)

## 2015-03-26 LAB — CBC WITH DIFFERENTIAL/PLATELET
Basophils Absolute: 0 10*3/uL (ref 0.0–0.2)
Basos: 1 %
EOS (ABSOLUTE): 0.1 10*3/uL (ref 0.0–0.4)
Eos: 2 %
Hematocrit: 39.7 % (ref 34.0–46.6)
Hemoglobin: 13 g/dL (ref 11.1–15.9)
Immature Grans (Abs): 0 10*3/uL (ref 0.0–0.1)
Immature Granulocytes: 1 %
Lymphocytes Absolute: 2.6 10*3/uL (ref 0.7–3.1)
Lymphs: 37 %
MCH: 29.9 pg (ref 26.6–33.0)
MCHC: 32.7 g/dL (ref 31.5–35.7)
MCV: 91 fL (ref 79–97)
Monocytes Absolute: 0.6 10*3/uL (ref 0.1–0.9)
Monocytes: 9 %
Neutrophils Absolute: 3.6 10*3/uL (ref 1.4–7.0)
Neutrophils: 50 %
Platelets: 183 10*3/uL (ref 150–379)
RBC: 4.35 x10E6/uL (ref 3.77–5.28)
RDW: 14.2 % (ref 12.3–15.4)
WBC: 7 10*3/uL (ref 3.4–10.8)

## 2015-03-26 LAB — COMPREHENSIVE METABOLIC PANEL
ALT: 23 IU/L (ref 0–32)
AST: 22 IU/L (ref 0–40)
Albumin/Globulin Ratio: 2 (ref 1.1–2.5)
Albumin: 4.2 g/dL (ref 3.6–4.8)
Alkaline Phosphatase: 51 IU/L (ref 39–117)
BUN/Creatinine Ratio: 16 (ref 11–26)
BUN: 17 mg/dL (ref 8–27)
Bilirubin Total: 0.7 mg/dL (ref 0.0–1.2)
CO2: 25 mmol/L (ref 18–29)
Calcium: 9.6 mg/dL (ref 8.7–10.3)
Chloride: 98 mmol/L (ref 97–106)
Creatinine, Ser: 1.04 mg/dL — ABNORMAL HIGH (ref 0.57–1.00)
GFR calc Af Amer: 64 mL/min/{1.73_m2} (ref >59)
GFR calc non Af Amer: 56 mL/min/{1.73_m2} — ABNORMAL LOW (ref >59)
Globulin, Total: 2.1 g/dL (ref 1.5–4.5)
Glucose: 98 mg/dL (ref 65–99)
Potassium: 4.2 mmol/L (ref 3.5–5.2)
Sodium: 141 mmol/L (ref 136–144)
Total Protein: 6.3 g/dL (ref 6.0–8.5)

## 2015-03-26 LAB — TSH: TSH: 3.1 u[IU]/mL (ref 0.450–4.500)

## 2015-03-31 DIAGNOSIS — M722 Plantar fascial fibromatosis: Secondary | ICD-10-CM | POA: Diagnosis not present

## 2015-04-07 ENCOUNTER — Ambulatory Visit (INDEPENDENT_AMBULATORY_CARE_PROVIDER_SITE_OTHER): Payer: Medicare Other | Admitting: Family Medicine

## 2015-04-07 ENCOUNTER — Encounter: Payer: Self-pay | Admitting: Family Medicine

## 2015-04-07 VITALS — BP 134/75 | HR 74 | Temp 97.8°F | Ht 63.3 in | Wt 154.0 lb

## 2015-04-07 DIAGNOSIS — I10 Essential (primary) hypertension: Secondary | ICD-10-CM | POA: Diagnosis not present

## 2015-04-07 DIAGNOSIS — B351 Tinea unguium: Secondary | ICD-10-CM | POA: Diagnosis not present

## 2015-04-07 NOTE — Assessment & Plan Note (Signed)
Discuss toenail care and treatment patient will also discuss with podiatry

## 2015-04-07 NOTE — Assessment & Plan Note (Signed)
The current medical regimen is effective;  continue present plan and medications.  

## 2015-04-07 NOTE — Progress Notes (Signed)
BP 134/75 mmHg  Pulse 74  Temp(Src) 97.8 F (36.6 C)  Ht 5' 3.3" (1.608 m)  Wt 154 lb (69.854 kg)  BMI 27.02 kg/m2  SpO2 99%  LMP  (LMP Unknown)   Subjective:    Patient ID: Julia Deleon, female    DOB: 02/21/1946, 69 y.o.   MRN: [id number]  HPI: Julia Deleon is a 69 y.o. female  Chief Complaint  Patient presents with  . Toe Pain    right great toe   patient with onychomycosis changes both great toenails with marked pain discomfort medial aspect midway down her great toenail Patient has orthotics and is done a lot of walking over the weekend and holiday's. Patient's had some inflammation in this area and slight redness which has resolved.  Hypertension has been good patient was unable to take hydrochlorothiazide secondary to constipation issues Patient stopped the hydrochlorothiazide her blood pressures remained good constipation eventually resolved.  Relevant past medical, surgical, family and social history reviewed and updated as indicated. Interim medical history since our last visit reviewed. Allergies and medications reviewed and updated.  Review of Systems  Constitutional: Negative.   Respiratory: Negative.   Cardiovascular: Negative.     Per HPI unless specifically indicated above     Objective:    BP 134/75 mmHg  Pulse 74  Temp(Src) 97.8 F (36.6 C)  Ht 5' 3.3" (1.608 m)  Wt 154 lb (69.854 kg)  BMI 27.02 kg/m2  SpO2 99%  LMP  (LMP Unknown)  Wt Readings from Last 3 Encounters:  04/07/15 154 lb (69.854 kg)  03/25/15 152 lb (68.947 kg)  02/26/15 151 lb (68.493 kg)    Physical Exam  Constitutional: She is oriented to person, place, and time. She appears well-developed and well-nourished. No distress.  HENT:  Head: Normocephalic and atraumatic.  Right Ear: Hearing normal.  Left Ear: Hearing normal.  Nose: Nose normal.  Eyes: Conjunctivae and lids are normal. Right eye exhibits no discharge. Left eye exhibits no discharge. No scleral icterus.   Cardiovascular: Normal rate, regular rhythm and normal heart sounds.   Pulmonary/Chest: Effort normal and breath sounds normal. No respiratory distress.  Musculoskeletal: Normal range of motion.  Neurological: She is alert and oriented to person, place, and time.  Skin: Skin is intact. No rash noted.  Toenail with mild sawtooth changes in area of redness and discomfort  Psychiatric: She has a normal mood and affect. Her speech is normal and behavior is normal. Judgment and thought content normal. Cognition and memory are normal.    Results for orders placed or performed in visit on 03/25/15  Comprehensive metabolic panel  Result Value Ref Range   Glucose 98 65 - 99 mg/dL   BUN 17 8 - 27 mg/dL   Creatinine, Ser 1.04 (H) 0.57 - 1.00 mg/dL   GFR calc non Af Amer 56 (L) >59 mL/min/1.73   GFR calc Af Amer 64 >59 mL/min/1.73   BUN/Creatinine Ratio 16 11 - 26   Sodium 141 136 - 144 mmol/L   Potassium 4.2 3.5 - 5.2 mmol/L   Chloride 98 97 - 106 mmol/L   CO2 25 18 - 29 mmol/L   Calcium 9.6 8.7 - 10.3 mg/dL   Total Protein 6.3 6.0 - 8.5 g/dL   Albumin 4.2 3.6 - 4.8 g/dL   Globulin, Total 2.1 1.5 - 4.5 g/dL   Albumin/Globulin Ratio 2.0 1.1 - 2.5   Bilirubin Total 0.7 0.0 - 1.2 mg/dL   Alkaline Phosphatase 51 39 -  117 IU/L   AST 22 0 - 40 IU/L   ALT 23 0 - 32 IU/L  Lipid panel  Result Value Ref Range   Cholesterol, Total 198 100 - 199 mg/dL   Triglycerides 107 0 - 149 mg/dL   HDL 40 >39 mg/dL   VLDL Cholesterol Cal 21 5 - 40 mg/dL   LDL Calculated 137 (H) 0 - 99 mg/dL   Chol/HDL Ratio 5.0 (H) 0.0 - 4.4 ratio units  CBC with Differential/Platelet  Result Value Ref Range   WBC 7.0 3.4 - 10.8 x10E3/uL   RBC 4.35 3.77 - 5.28 x10E6/uL   Hemoglobin 13.0 11.1 - 15.9 g/dL   Hematocrit 39.7 34.0 - 46.6 %   MCV 91 79 - 97 fL   MCH 29.9 26.6 - 33.0 pg   MCHC 32.7 31.5 - 35.7 g/dL   RDW 14.2 12.3 - 15.4 %   Platelets 183 150 - 379 x10E3/uL   Neutrophils 50 %   Lymphs 37 %   Monocytes 9  %   Eos 2 %   Basos 1 %   Neutrophils Absolute 3.6 1.4 - 7.0 x10E3/uL   Lymphocytes Absolute 2.6 0.7 - 3.1 x10E3/uL   Monocytes Absolute 0.6 0.1 - 0.9 x10E3/uL   EOS (ABSOLUTE) 0.1 0.0 - 0.4 x10E3/uL   Basophils Absolute 0.0 0.0 - 0.2 x10E3/uL   Immature Granulocytes 1 %   Immature Grans (Abs) 0.0 0.0 - 0.1 x10E3/uL  Urinalysis, Routine w reflex microscopic (not at [facility])  Result Value Ref Range   Specific Gravity, UA 1.015 1.005 - 1.030   pH, UA 6.5 5.0 - 7.5   Color, UA Yellow Yellow   Appearance Ur Clear Clear   Leukocytes, UA Negative Negative   Protein, UA Negative Negative/Trace   Glucose, UA Negative Negative   Ketones, UA Negative Negative   RBC, UA Negative Negative   Bilirubin, UA Negative Negative   Urobilinogen, Ur 0.2 0.2 - 1.0 mg/dL   Nitrite, UA Negative Negative  TSH  Result Value Ref Range   TSH 3.100 0.450 - 4.500 uIU/mL      Assessment & Plan:   Problem List Items Addressed This Visit      Cardiovascular and Mediastinum   Hypertension    The current medical regimen is effective;  continue present plan and medications.         Musculoskeletal and Integument   Onychomycosis due to dermatophyte - Primary    Discuss toenail care and treatment patient will also discuss with podiatry          Follow up plan: Return for As scheduled.

## 2015-04-14 DIAGNOSIS — E7800 Pure hypercholesterolemia, unspecified: Secondary | ICD-10-CM | POA: Diagnosis not present

## 2015-04-14 DIAGNOSIS — I10 Essential (primary) hypertension: Secondary | ICD-10-CM | POA: Diagnosis not present

## 2015-04-14 DIAGNOSIS — N183 Chronic kidney disease, stage 3 (moderate): Secondary | ICD-10-CM | POA: Diagnosis not present

## 2015-04-23 DIAGNOSIS — M659 Synovitis and tenosynovitis, unspecified: Secondary | ICD-10-CM | POA: Diagnosis not present

## 2015-04-23 DIAGNOSIS — M216X2 Other acquired deformities of left foot: Secondary | ICD-10-CM | POA: Diagnosis not present

## 2015-06-09 ENCOUNTER — Other Ambulatory Visit: Payer: Self-pay | Admitting: Family Medicine

## 2015-06-09 ENCOUNTER — Other Ambulatory Visit: Payer: Self-pay

## 2015-06-09 ENCOUNTER — Other Ambulatory Visit: Payer: Medicare Other

## 2015-06-09 ENCOUNTER — Telehealth: Payer: Self-pay | Admitting: Family Medicine

## 2015-06-09 DIAGNOSIS — N390 Urinary tract infection, site not specified: Secondary | ICD-10-CM | POA: Diagnosis not present

## 2015-06-09 DIAGNOSIS — R300 Dysuria: Secondary | ICD-10-CM

## 2015-06-09 NOTE — Telephone Encounter (Signed)
Pt states she will need Diflucan with the antibiotic if UTI is positive.

## 2015-06-10 LAB — URINALYSIS, ROUTINE W REFLEX MICROSCOPIC
Bilirubin, UA: NEGATIVE
Glucose, UA: NEGATIVE
Ketones, UA: NEGATIVE
Nitrite, UA: POSITIVE — AB
Protein, UA: NEGATIVE
Specific Gravity, UA: 1.01 (ref 1.005–1.030)
Urobilinogen, Ur: 0.2 mg/dL (ref 0.2–1.0)
pH, UA: 6 (ref 5.0–7.5)

## 2015-06-10 LAB — MICROSCOPIC EXAMINATION

## 2015-06-11 LAB — URINE CULTURE

## 2015-06-11 LAB — PLEASE NOTE

## 2015-06-12 ENCOUNTER — Encounter: Payer: Self-pay | Admitting: Family Medicine

## 2015-06-12 ENCOUNTER — Ambulatory Visit (INDEPENDENT_AMBULATORY_CARE_PROVIDER_SITE_OTHER): Payer: Medicare Other | Admitting: Family Medicine

## 2015-06-12 VITALS — BP 122/64 | HR 66 | Temp 97.5°F | Ht 63.8 in | Wt 153.0 lb

## 2015-06-12 DIAGNOSIS — N390 Urinary tract infection, site not specified: Secondary | ICD-10-CM

## 2015-06-12 DIAGNOSIS — E785 Hyperlipidemia, unspecified: Secondary | ICD-10-CM

## 2015-06-12 DIAGNOSIS — I10 Essential (primary) hypertension: Secondary | ICD-10-CM | POA: Diagnosis not present

## 2015-06-12 DIAGNOSIS — E038 Other specified hypothyroidism: Secondary | ICD-10-CM | POA: Diagnosis not present

## 2015-06-12 DIAGNOSIS — G5790 Unspecified mononeuropathy of unspecified lower limb: Secondary | ICD-10-CM

## 2015-06-12 DIAGNOSIS — N183 Chronic kidney disease, stage 3 (moderate): Secondary | ICD-10-CM | POA: Diagnosis not present

## 2015-06-12 DIAGNOSIS — H1851 Endothelial corneal dystrophy: Secondary | ICD-10-CM | POA: Diagnosis not present

## 2015-06-12 MED ORDER — CIPROFLOXACIN HCL 250 MG PO TABS: 250.0000 mg | ORAL_TABLET | Freq: Two times a day (BID) | ORAL | Status: DC

## 2015-06-12 MED ORDER — FLUCONAZOLE 150 MG PO TABS: 150.0000 mg | ORAL_TABLET | Freq: Once | ORAL | Status: DC

## 2015-06-12 NOTE — Assessment & Plan Note (Signed)
Discuss again with patient recurrent UTI care and treatment prevention fluids Cipro prophylactically after sexual relations Discuss use of Diflucan If not successful refer to urology

## 2015-06-12 NOTE — Assessment & Plan Note (Signed)
The current medical regimen is effective;  continue present plan and medications.  

## 2015-06-12 NOTE — Addendum Note (Signed)
Addended by: Wynn Maudlin on: 06/12/2015 04:33 PM   Modules accepted: Orders

## 2015-06-12 NOTE — Progress Notes (Signed)
BP 122/64 mmHg  Pulse 66  Temp(Src) 97.5 F (36.4 C)  Ht 5' 3.8" (1.621 m)  Wt 153 lb (69.4 kg)  BMI 26.41 kg/m2  SpO2 99%  LMP  (LMP Unknown)   Subjective:    Patient ID: Julia Deleon, female    DOB: December 24, 1945, 70 y.o.   MRN: [id number]  HPI: Julia Deleon is a 70 y.o. female  Chief Complaint  Patient presents with  . bloodwork  . URI  . Urinary Tract Infection    patient with recurrent urinary  Tract infection especially after sexual relations Reviewed care and treatment culture and sensitivity shows sensitive to Cipro which patient has responded nicely to  Patient needs Diflucn   Patient also concerned about some genetic deficiency a sister has the results and high folic acid levels. Her folic acid levels drawn  Reviewed diagnoses and other ways to have blood work rawn and paid for by insurance. There are no options and ABN was printed Relevant past medical, surgical, family and social history reviewed and updated as indicated. Interim medical history since our last visit reviewed. Allergies and medications reviewed and updated.  Review of Systems  Constitutional: Negative.   Respiratory: Negative.   Cardiovascular: Negative.     Per HPI unless specifically indicated above     Objective:    BP 122/64 mmHg  Pulse 66  Temp(Src) 97.5 F (36.4 C)  Ht 5' 3.8" (1.621 m)  Wt 153 lb (69.4 kg)  BMI 26.41 kg/m2  SpO2 99%  LMP  (LMP Unknown)  Wt Readings from Last 3 Encounters:  06/12/15 153 lb (69.4 kg)  04/07/15 154 lb (69.854 kg)  03/25/15 152 lb (68.947 kg)    Physical Exam  Constitutional: She is oriented to person, place, and time. She appears well-developed and well-nourished. No distress.  HENT:  Head: Normocephalic and atraumatic.  Right Ear: Hearing normal.  Left Ear: Hearing normal.  Nose: Nose normal.  Eyes: Conjunctivae and lids are normal. Right eye exhibits no discharge. Left eye exhibits no discharge. No scleral icterus.  Cardiovascular: Normal  rate, regular rhythm and normal heart sounds.   Pulmonary/Chest: Effort normal and breath sounds normal. No respiratory distress.  Musculoskeletal: Normal range of motion.  Neurological: She is alert and oriented to person, place, and time.  Skin: Skin is intact. No rash noted.  Psychiatric: She has a normal mood and affect. Her speech is normal and behavior is normal. Judgment and thought content normal. Cognition and memory are normal.    Results for orders placed or performed in visit on 06/09/15  Urine culture  Result Value Ref Range   Urine Culture, Routine Final report (A)    Urine Culture result 1 Escherichia coli (A)    RESULT 2 Lactobacillus species    ANTIMICROBIAL SUSCEPTIBILITY Comment   Please Note  Result Value Ref Range   Please note Comment       Assessment & Plan:   Problem List Items Addressed This Visit      Cardiovascular and Mediastinum   Essential hypertension - Primary    The current medical regimen is effective;  continue present plan and medications.         Endocrine   Hypothyroidism    The current medical regimen is effective;  continue present plan and medications.         Genitourinary   UTI (lower urinary tract infection)      Discuss again with patient recurrent UTI care and treatment  prevention fluids Cipro prophylactically after sexual relations Discuss use of Diflucan If not successful refer to urology       Relevant Medications   fluconazole (DIFLUCAN) 150 MG tablet     Other   Hyperlipidemia    The current medical regimen is effective;  continue present plan and medications.        Other Visit Diagnoses    Neuropathy of foot, unspecified laterality            Follow up plan: Return for As scheduled.

## 2015-06-13 ENCOUNTER — Telehealth: Payer: Self-pay | Admitting: Family Medicine

## 2015-06-13 LAB — FOLATE: Folate: >20 ng/mL (ref >3.0)

## 2015-06-13 NOTE — Telephone Encounter (Signed)
Pt called and stated that the message that wasn't listed was chlorzoxazone 500 mg.

## 2015-06-16 ENCOUNTER — Encounter: Payer: Self-pay | Admitting: Family Medicine

## 2015-06-16 NOTE — Telephone Encounter (Signed)
Med added

## 2015-07-14 ENCOUNTER — Telehealth: Payer: Self-pay

## 2015-07-14 DIAGNOSIS — R5382 Chronic fatigue, unspecified: Secondary | ICD-10-CM

## 2015-07-14 MED ORDER — SULFAMETHOXAZOLE-TRIMETHOPRIM 800-160 MG PO TABS: 1.0000 | ORAL_TABLET | Freq: Once | ORAL | Status: DC

## 2015-07-14 NOTE — Telephone Encounter (Signed)
Patient requesting another ABX than Cipro, it "tears her system up"

## 2015-07-14 NOTE — Telephone Encounter (Signed)
Phone call Discussed with patient continued ongoing fatigue because of ongoing symptoms will check TSH and T4, free T3 Patient unable to tolerate Cipro for suppression after sexual activity causes of GI upset will change to trimethoprim sulfamethoxazole

## 2015-07-17 ENCOUNTER — Other Ambulatory Visit: Payer: Medicare Other

## 2015-07-17 DIAGNOSIS — R5382 Chronic fatigue, unspecified: Secondary | ICD-10-CM

## 2015-07-18 LAB — TSH: TSH: 4.85 u[IU]/mL — ABNORMAL HIGH (ref 0.450–4.500)

## 2015-07-18 LAB — T3, FREE: T3, Free: 2.8 pg/mL (ref 2.0–4.4)

## 2015-07-18 LAB — T4: T4, Total: 8 ug/dL (ref 4.5–12.0)

## 2015-07-21 ENCOUNTER — Telehealth: Payer: Self-pay | Admitting: Family Medicine

## 2015-07-21 DIAGNOSIS — R7989 Other specified abnormal findings of blood chemistry: Secondary | ICD-10-CM

## 2015-07-21 NOTE — Telephone Encounter (Signed)
-----   Message from Wynn Maudlin, [place] sent at 07/21/2015  5:07 PM EDT ----- labs

## 2015-07-21 NOTE — Telephone Encounter (Signed)
-----   Message from Wynn Maudlin, [place] sent at 07/21/2015  5:07 PM EDT ----- labs

## 2015-07-21 NOTE — Telephone Encounter (Signed)
Phone call Discussed with patient slight elevation TSH patient with fatigue will recheck TSH 2 months

## 2015-07-28 DIAGNOSIS — D1800 Hemangioma unspecified site: Secondary | ICD-10-CM | POA: Diagnosis not present

## 2015-07-28 DIAGNOSIS — L918 Other hypertrophic disorders of the skin: Secondary | ICD-10-CM | POA: Diagnosis not present

## 2015-07-28 DIAGNOSIS — L814 Other melanin hyperpigmentation: Secondary | ICD-10-CM | POA: Diagnosis not present

## 2015-07-28 DIAGNOSIS — Z1283 Encounter for screening for malignant neoplasm of skin: Secondary | ICD-10-CM | POA: Diagnosis not present

## 2015-07-28 DIAGNOSIS — J0101 Acute recurrent maxillary sinusitis: Secondary | ICD-10-CM | POA: Diagnosis not present

## 2015-07-28 DIAGNOSIS — L718 Other rosacea: Secondary | ICD-10-CM | POA: Diagnosis not present

## 2015-07-28 DIAGNOSIS — B078 Other viral warts: Secondary | ICD-10-CM | POA: Diagnosis not present

## 2015-07-28 DIAGNOSIS — L821 Other seborrheic keratosis: Secondary | ICD-10-CM | POA: Diagnosis not present

## 2015-07-28 DIAGNOSIS — B351 Tinea unguium: Secondary | ICD-10-CM | POA: Diagnosis not present

## 2015-09-10 ENCOUNTER — Ambulatory Visit (INDEPENDENT_AMBULATORY_CARE_PROVIDER_SITE_OTHER): Payer: Medicare Other | Admitting: Obstetrics and Gynecology

## 2015-09-10 ENCOUNTER — Telehealth: Payer: Self-pay | Admitting: Family Medicine

## 2015-09-10 ENCOUNTER — Encounter: Payer: Self-pay | Admitting: Obstetrics and Gynecology

## 2015-09-10 VITALS — BP 125/72 | HR 65 | Ht 64.0 in | Wt 155.4 lb

## 2015-09-10 DIAGNOSIS — N951 Menopausal and female climacteric states: Secondary | ICD-10-CM | POA: Diagnosis not present

## 2015-09-10 DIAGNOSIS — Z01419 Encounter for gynecological examination (general) (routine) without abnormal findings: Secondary | ICD-10-CM | POA: Diagnosis not present

## 2015-09-10 DIAGNOSIS — N952 Postmenopausal atrophic vaginitis: Secondary | ICD-10-CM | POA: Diagnosis not present

## 2015-09-10 DIAGNOSIS — Z1211 Encounter for screening for malignant neoplasm of colon: Secondary | ICD-10-CM

## 2015-09-10 DIAGNOSIS — Z124 Encounter for screening for malignant neoplasm of cervix: Secondary | ICD-10-CM

## 2015-09-10 DIAGNOSIS — Z780 Asymptomatic menopausal state: Secondary | ICD-10-CM | POA: Diagnosis not present

## 2015-09-10 DIAGNOSIS — Z1239 Encounter for other screening for malignant neoplasm of breast: Secondary | ICD-10-CM

## 2015-09-10 NOTE — Patient Instructions (Signed)
1. Pap smear is done. 2. Mammogram is ordered. 3. Stool guaiac cards are given. 4. Continue with healthy eating and exercise. 5. Continue with bioidentical progesterone cream every morning and evening as directed 6. Continue with vaginal estrogen cream twice a week 7. Continue with calcium and vitamin D supplementation 8. Return in 1 year for physical

## 2015-09-10 NOTE — Telephone Encounter (Signed)
Pt called and would like to know if she did a stool sample when she came in for cpe.

## 2015-09-10 NOTE — Telephone Encounter (Signed)
No we did not, you can call and let her know

## 2015-09-10 NOTE — Progress Notes (Signed)
Patient ID: Julia Deleon, female   DOB: 1945/08/06, 70 y.o.   MRN: [id number] ANNUAL PREVENTATIVE CARE GYN  ENCOUNTER NOTE  Subjective:       Julia Deleon is a 70 y.o. No obstetric history on file. female here for a routine annual gynecologic exam.  Current complaints: 1.  Check hormone levels-Patient stopped using hormone therapy except for progesterone. She is having early morning Flashes. She is using minimal progesterone because when using the standard dose she notes extreme increase in appetite. Review of patient's salivary hormone levels reveals low serum progesterone, normal DHEA slightly elevated testosterone. Estradiol level is borderline elevated.  Significant psychosocial issues include settling In laws estate which has compromised lifestyle.   Gynecologic History No LMP recorded (lmp unknown). Patient is postmenopausal. Contraception: post menopausal status Last Pap: 2015 neg. Results were: normal Last mammogram: 09/2014. Results were: normal  Obstetric History OB History  No data available    Past Medical History  Diagnosis Date  . COPD (chronic obstructive pulmonary disease) ([place])   . Fuch's endothelial dystrophy   . Hyperlipidemia   . Hypertension     Past Surgical History  Procedure Laterality Date  . Eye surgery    . Cholecystectomy    . Appendectomy    . Foot surgery Right   . Nasal sinus surgery    . Laser vein surgery    . Tonsillectomy      Current Outpatient Prescriptions on File Prior to Visit  Medication Sig Dispense Refill  . amLODipine (NORVASC) 5 MG tablet Take 1 tablet (5 mg total) by mouth daily. 90 tablet 4  . Azelastine-Fluticasone (DYMISTA) 137-50 MCG/ACT SUSP Place 1 spray into the nose 2 (two) times daily.    . chlorzoxazone (PARAFON) 500 MG tablet Take 500 mg by mouth 4 (four) times daily as needed for muscle spasms.    . fexofenadine-pseudoephedrine (ALLEGRA-D) 60-120 MG per tablet Take 1 tablet by mouth 2 (two) times daily.    Marland Kitchen  levothyroxine (SYNTHROID, LEVOTHROID) 75 MCG tablet Take 1 tablet (75 mcg total) by mouth daily. 90 tablet 4  . losartan (COZAAR) 100 MG tablet Take 1 tablet (100 mg total) by mouth daily. 90 tablet 4  . omega-3 acid ethyl esters (LOVAZA) 1 G capsule Take 2 g by mouth 2 (two) times daily.    Marland Kitchen oxybutynin (DITROPAN XL) 15 MG 24 hr tablet Take 1 tablet (15 mg total) by mouth at bedtime. 30 tablet 1   No current facility-administered medications on file prior to visit.    Allergies  Allergen Reactions  . Amoxicillin-Pot Clavulanate Nausea Only  . Levofloxacin Hives  . Simvastatin Itching    Social History   Social History  . Marital Status: Married    Spouse Name: N/A  . Number of Children: N/A  . Years of Education: N/A   Occupational History  . Not on file.   Social History Main Topics  . Smoking status: Never Smoker   . Smokeless tobacco: Never Used  . Alcohol Use: No  . Drug Use: No  . Sexual Activity: Yes    Birth Control/ Protection: Post-menopausal   Other Topics Concern  . Not on file   Social History Narrative    Family History  Problem Relation Age of Onset  . Cancer Mother     lung  . CAD Mother   . Stroke Father   . Cancer Sister     breast  . Varicose Veins Son   . Heart  disease Maternal Grandmother   . Cancer Paternal Grandmother     throat  . Emphysema Paternal Grandfather   . Fibromyalgia Sister     The following portions of the patient's history were reviewed and updated as appropriate: allergies, current medications, past family history, past medical history, past social history, past surgical history and problem list.  Review of Systems ROS Review of Systems - General ROS: negative for - chills, fatigue, fever, hot flashes, night sweats, weight gain or weight loss Psychological ROS: negative for - anxiety, decreased libido, depression, mood swings, physical abuse or sexual abuse Ophthalmic ROS: negative for - blurry vision, eye pain or  loss of vision ENT ROS: negative for - headaches, hearing change, visual changes or vocal changes Allergy and Immunology ROS: negative for - hives, itchy/watery eyes or seasonal allergies Hematological and Lymphatic ROS: negative for - bleeding problems, bruising, swollen lymph nodes or weight loss Endocrine ROS: negative for - galactorrhea, hair pattern changes, hot flashes, malaise/lethargy, mood swings, palpitations, polydipsia/polyuria, skin changes, temperature intolerance or unexpected weight changes Breast ROS: negative for - new or changing breast lumps or nipple discharge Respiratory ROS: negative for - cough or shortness of breath Cardiovascular ROS: negative for - chest pain, irregular heartbeat, palpitations or shortness of breath Gastrointestinal ROS: no abdominal pain, change in bowel habits, or black or bloody stools Genito-Urinary ROS: no dysuria, trouble voiding, or hematuria Musculoskeletal ROS: negative for - joint pain or joint stiffness Neurological ROS: negative for - bowel and bladder control changes Dermatological ROS: negative for rash and skin lesion changes   Objective:   BP 125/72 mmHg  Pulse 65  Ht 5\' 4"  (1.626 m)  Wt 155 lb 6.4 oz (70.489 kg)  BMI 26.66 kg/m2  LMP  (LMP Unknown) CONSTITUTIONAL: Well-developed, well-nourished female in no acute distress.  PSYCHIATRIC: Normal mood and affect. Normal behavior. Normal judgment and thought content. [place]: Alert and oriented to person, place, and time. Normal muscle tone coordination. No cranial nerve deficit noted. HENT:  Normocephalic, atraumatic, External right and left ear normal. Oropharynx is clear and moist EYES: Conjunctivae and EOM are normal.  No scleral icterus.  NECK: Normal range of motion, supple, no masses.  Normal thyroid.  SKIN: Skin is warm and dry. No rash noted. Not diaphoretic. No erythema. No pallor. CARDIOVASCULAR: Normal heart rate noted, regular rhythm, no murmur. RESPIRATORY: Clear  to auscultation bilaterally. Effort and breath sounds normal, no problems with respiration noted. BREASTS: Symmetric in size. No masses, skin changes, nipple drainage, or lymphadenopathy. ABDOMEN: Soft, normal bowel sounds, no distention noted.  No tenderness, rebound or guarding.  BLADDER: Normal PELVIC:  External Genitalia: Normal  BUS: Urethral caruncle  Vagina: Moderate atrophic changes  Cervix: Normal; no lesions  Uterus: Normal; midplane, normal size and shape, mobile, nontender  Adnexa: Normal  RV: Decreased sphincter tone, External Exam NormaI and No Rectal Masses  MUSCULOSKELETAL: Normal range of motion. No tenderness.  No cyanosis, clubbing, or edema.  2+ distal pulses. LYMPHATIC: No Axillary, Supraclavicular, or Inguinal Adenopathy.    Assessment:   Annual gynecologic examination 70 y.o. Contraception: post menopausal status [id number] Menopausal state, symptomatic, on bioidentical hormone therapy  Plan:  Pap: Pap, Reflex if ASCUS Mammogram: Ordered Stool Guaiac Testing:  Ordered Labs: thru pcp Routine preventative health maintenance measures emphasized: Exercise/Diet/Weight control, Tobacco Warnings and Alcohol/Substance use risks Recommend bioidentical hormone therapy as follows: Progesterone cream topically twice a day Estradiol vaginal cream twice a week The bioidentical hormone therapy may help with hot flashes,  vaginal dryness, and possible minimizing UTIs associated with sex with the potential beneficial impact on the urothelium or so. Return to [place], [place]  Brayton Mars, MD  Note: This dictation was prepared with Dragon dictation along with smaller phrase technology. Any transcriptional errors that result from this process are unintentional.

## 2015-09-11 ENCOUNTER — Telehealth: Payer: Self-pay | Admitting: Family Medicine

## 2015-09-11 NOTE — Telephone Encounter (Signed)
Phone call Patient's leg hip still hurting describes IT band syndrome will refer to physical therapy to further evaluate.

## 2015-09-11 NOTE — Telephone Encounter (Signed)
Pt would like MAC to give her a call back regarding her back pain.

## 2015-09-13 LAB — PAP IG W/ RFLX HPV ASCU: PAP Smear Comment: 0

## 2015-09-16 DIAGNOSIS — Z1231 Encounter for screening mammogram for malignant neoplasm of breast: Secondary | ICD-10-CM | POA: Diagnosis not present

## 2015-09-23 DIAGNOSIS — M7632 Iliotibial band syndrome, left leg: Secondary | ICD-10-CM | POA: Diagnosis not present

## 2015-09-23 DIAGNOSIS — M25652 Stiffness of left hip, not elsewhere classified: Secondary | ICD-10-CM | POA: Diagnosis not present

## 2015-09-23 DIAGNOSIS — M25552 Pain in left hip: Secondary | ICD-10-CM | POA: Diagnosis not present

## 2015-09-23 DIAGNOSIS — M461 Sacroiliitis, not elsewhere classified: Secondary | ICD-10-CM | POA: Diagnosis not present

## 2015-09-23 DIAGNOSIS — M545 Low back pain: Secondary | ICD-10-CM | POA: Diagnosis not present

## 2015-09-25 DIAGNOSIS — M25552 Pain in left hip: Secondary | ICD-10-CM | POA: Diagnosis not present

## 2015-09-25 DIAGNOSIS — M7632 Iliotibial band syndrome, left leg: Secondary | ICD-10-CM | POA: Diagnosis not present

## 2015-09-25 DIAGNOSIS — M25652 Stiffness of left hip, not elsewhere classified: Secondary | ICD-10-CM | POA: Diagnosis not present

## 2015-09-25 DIAGNOSIS — M461 Sacroiliitis, not elsewhere classified: Secondary | ICD-10-CM | POA: Diagnosis not present

## 2015-09-25 DIAGNOSIS — M545 Low back pain: Secondary | ICD-10-CM | POA: Diagnosis not present

## 2015-09-26 DIAGNOSIS — J0101 Acute recurrent maxillary sinusitis: Secondary | ICD-10-CM | POA: Diagnosis not present

## 2015-10-01 DIAGNOSIS — M25552 Pain in left hip: Secondary | ICD-10-CM | POA: Diagnosis not present

## 2015-10-01 DIAGNOSIS — M25652 Stiffness of left hip, not elsewhere classified: Secondary | ICD-10-CM | POA: Diagnosis not present

## 2015-10-01 DIAGNOSIS — M545 Low back pain: Secondary | ICD-10-CM | POA: Diagnosis not present

## 2015-10-01 DIAGNOSIS — M461 Sacroiliitis, not elsewhere classified: Secondary | ICD-10-CM | POA: Diagnosis not present

## 2015-10-01 DIAGNOSIS — M7632 Iliotibial band syndrome, left leg: Secondary | ICD-10-CM | POA: Diagnosis not present

## 2015-10-03 DIAGNOSIS — M461 Sacroiliitis, not elsewhere classified: Secondary | ICD-10-CM | POA: Diagnosis not present

## 2015-10-03 DIAGNOSIS — M25652 Stiffness of left hip, not elsewhere classified: Secondary | ICD-10-CM | POA: Diagnosis not present

## 2015-10-03 DIAGNOSIS — M25552 Pain in left hip: Secondary | ICD-10-CM | POA: Diagnosis not present

## 2015-10-03 DIAGNOSIS — M545 Low back pain: Secondary | ICD-10-CM | POA: Diagnosis not present

## 2015-10-03 DIAGNOSIS — M7632 Iliotibial band syndrome, left leg: Secondary | ICD-10-CM | POA: Diagnosis not present

## 2015-10-09 DIAGNOSIS — M461 Sacroiliitis, not elsewhere classified: Secondary | ICD-10-CM | POA: Diagnosis not present

## 2015-10-09 DIAGNOSIS — M545 Low back pain: Secondary | ICD-10-CM | POA: Diagnosis not present

## 2015-10-09 DIAGNOSIS — M25652 Stiffness of left hip, not elsewhere classified: Secondary | ICD-10-CM | POA: Diagnosis not present

## 2015-10-09 DIAGNOSIS — M25552 Pain in left hip: Secondary | ICD-10-CM | POA: Diagnosis not present

## 2015-10-09 DIAGNOSIS — M7632 Iliotibial band syndrome, left leg: Secondary | ICD-10-CM | POA: Diagnosis not present

## 2015-10-10 DIAGNOSIS — M7632 Iliotibial band syndrome, left leg: Secondary | ICD-10-CM | POA: Diagnosis not present

## 2015-10-10 DIAGNOSIS — M545 Low back pain: Secondary | ICD-10-CM | POA: Diagnosis not present

## 2015-10-10 DIAGNOSIS — M25652 Stiffness of left hip, not elsewhere classified: Secondary | ICD-10-CM | POA: Diagnosis not present

## 2015-10-10 DIAGNOSIS — M461 Sacroiliitis, not elsewhere classified: Secondary | ICD-10-CM | POA: Diagnosis not present

## 2015-10-10 DIAGNOSIS — M25552 Pain in left hip: Secondary | ICD-10-CM | POA: Diagnosis not present

## 2015-10-13 DIAGNOSIS — M25552 Pain in left hip: Secondary | ICD-10-CM | POA: Diagnosis not present

## 2015-10-13 DIAGNOSIS — M7632 Iliotibial band syndrome, left leg: Secondary | ICD-10-CM | POA: Diagnosis not present

## 2015-10-13 DIAGNOSIS — M25652 Stiffness of left hip, not elsewhere classified: Secondary | ICD-10-CM | POA: Diagnosis not present

## 2015-10-13 DIAGNOSIS — M461 Sacroiliitis, not elsewhere classified: Secondary | ICD-10-CM | POA: Diagnosis not present

## 2015-10-13 DIAGNOSIS — M545 Low back pain: Secondary | ICD-10-CM | POA: Diagnosis not present

## 2015-10-15 ENCOUNTER — Other Ambulatory Visit: Payer: Self-pay | Admitting: Family Medicine

## 2015-10-15 DIAGNOSIS — M7632 Iliotibial band syndrome, left leg: Secondary | ICD-10-CM | POA: Diagnosis not present

## 2015-10-15 DIAGNOSIS — M25552 Pain in left hip: Secondary | ICD-10-CM | POA: Diagnosis not present

## 2015-10-15 DIAGNOSIS — M461 Sacroiliitis, not elsewhere classified: Secondary | ICD-10-CM | POA: Diagnosis not present

## 2015-10-15 DIAGNOSIS — M25652 Stiffness of left hip, not elsewhere classified: Secondary | ICD-10-CM | POA: Diagnosis not present

## 2015-10-15 DIAGNOSIS — M545 Low back pain: Secondary | ICD-10-CM | POA: Diagnosis not present

## 2015-10-20 DIAGNOSIS — M25652 Stiffness of left hip, not elsewhere classified: Secondary | ICD-10-CM | POA: Diagnosis not present

## 2015-10-20 DIAGNOSIS — M25552 Pain in left hip: Secondary | ICD-10-CM | POA: Diagnosis not present

## 2015-10-20 DIAGNOSIS — M7632 Iliotibial band syndrome, left leg: Secondary | ICD-10-CM | POA: Diagnosis not present

## 2015-10-20 DIAGNOSIS — M545 Low back pain: Secondary | ICD-10-CM | POA: Diagnosis not present

## 2015-10-20 DIAGNOSIS — M461 Sacroiliitis, not elsewhere classified: Secondary | ICD-10-CM | POA: Diagnosis not present

## 2015-10-22 DIAGNOSIS — M25552 Pain in left hip: Secondary | ICD-10-CM | POA: Diagnosis not present

## 2015-10-22 DIAGNOSIS — M545 Low back pain: Secondary | ICD-10-CM | POA: Diagnosis not present

## 2015-10-22 DIAGNOSIS — M7632 Iliotibial band syndrome, left leg: Secondary | ICD-10-CM | POA: Diagnosis not present

## 2015-10-22 DIAGNOSIS — M461 Sacroiliitis, not elsewhere classified: Secondary | ICD-10-CM | POA: Diagnosis not present

## 2015-10-22 DIAGNOSIS — M25652 Stiffness of left hip, not elsewhere classified: Secondary | ICD-10-CM | POA: Diagnosis not present

## 2015-10-27 DIAGNOSIS — M25652 Stiffness of left hip, not elsewhere classified: Secondary | ICD-10-CM | POA: Diagnosis not present

## 2015-10-27 DIAGNOSIS — M545 Low back pain: Secondary | ICD-10-CM | POA: Diagnosis not present

## 2015-10-27 DIAGNOSIS — M25552 Pain in left hip: Secondary | ICD-10-CM | POA: Diagnosis not present

## 2015-10-27 DIAGNOSIS — M7632 Iliotibial band syndrome, left leg: Secondary | ICD-10-CM | POA: Diagnosis not present

## 2015-10-27 DIAGNOSIS — M461 Sacroiliitis, not elsewhere classified: Secondary | ICD-10-CM | POA: Diagnosis not present

## 2015-10-28 ENCOUNTER — Telehealth: Payer: Self-pay

## 2015-10-28 DIAGNOSIS — R600 Localized edema: Secondary | ICD-10-CM | POA: Diagnosis not present

## 2015-10-28 DIAGNOSIS — R601 Generalized edema: Secondary | ICD-10-CM | POA: Diagnosis not present

## 2015-10-28 DIAGNOSIS — I10 Essential (primary) hypertension: Secondary | ICD-10-CM | POA: Diagnosis not present

## 2015-10-28 DIAGNOSIS — N182 Chronic kidney disease, stage 2 (mild): Secondary | ICD-10-CM | POA: Diagnosis not present

## 2015-10-28 MED ORDER — NITROFURANTOIN MONOHYD MACRO 100 MG PO CAPS: 100.0000 mg | ORAL_CAPSULE | Freq: Two times a day (BID) | ORAL | Status: DC

## 2015-10-28 NOTE — Telephone Encounter (Signed)
Wants to discuss ABX for UTI's

## 2015-10-29 DIAGNOSIS — M25652 Stiffness of left hip, not elsewhere classified: Secondary | ICD-10-CM | POA: Diagnosis not present

## 2015-10-29 DIAGNOSIS — M461 Sacroiliitis, not elsewhere classified: Secondary | ICD-10-CM | POA: Diagnosis not present

## 2015-10-29 DIAGNOSIS — M7632 Iliotibial band syndrome, left leg: Secondary | ICD-10-CM | POA: Diagnosis not present

## 2015-10-29 DIAGNOSIS — M25552 Pain in left hip: Secondary | ICD-10-CM | POA: Diagnosis not present

## 2015-10-29 DIAGNOSIS — M545 Low back pain: Secondary | ICD-10-CM | POA: Diagnosis not present

## 2015-10-31 DIAGNOSIS — I788 Other diseases of capillaries: Secondary | ICD-10-CM | POA: Diagnosis not present

## 2015-10-31 DIAGNOSIS — L720 Epidermal cyst: Secondary | ICD-10-CM | POA: Diagnosis not present

## 2015-11-03 DIAGNOSIS — M461 Sacroiliitis, not elsewhere classified: Secondary | ICD-10-CM | POA: Diagnosis not present

## 2015-11-03 DIAGNOSIS — M25652 Stiffness of left hip, not elsewhere classified: Secondary | ICD-10-CM | POA: Diagnosis not present

## 2015-11-03 DIAGNOSIS — M545 Low back pain: Secondary | ICD-10-CM | POA: Diagnosis not present

## 2015-11-03 DIAGNOSIS — M7632 Iliotibial band syndrome, left leg: Secondary | ICD-10-CM | POA: Diagnosis not present

## 2015-11-03 DIAGNOSIS — M25552 Pain in left hip: Secondary | ICD-10-CM | POA: Diagnosis not present

## 2015-11-04 ENCOUNTER — Telehealth: Payer: Self-pay

## 2015-11-04 NOTE — Telephone Encounter (Signed)
Order signed and faxed.

## 2015-11-04 NOTE — Telephone Encounter (Signed)
Patient called inquiring about referral to PT Caren Griffins w/ [facility]) .  Patient states she texted Dr. Jeananne Rama on Friday 10/31/15 and he was kind enough to call her back.  They discussed her right shoulder pain and the fact that she is currently seeing PT for her hip and that she would love to be able to work on her shoulder pain.  He agreed and called Caren Griffins at [facility] to give verbal order.  Per her office visit on 02/26/2015 and again at her CPE on 03/25/2015 where they discussed her should pain and need for PT when she had time.    Can you order PT for patient's right shoulder to evaluate and treat for should impingement

## 2015-11-05 DIAGNOSIS — M545 Low back pain: Secondary | ICD-10-CM | POA: Diagnosis not present

## 2015-11-05 DIAGNOSIS — M25652 Stiffness of left hip, not elsewhere classified: Secondary | ICD-10-CM | POA: Diagnosis not present

## 2015-11-05 DIAGNOSIS — M7632 Iliotibial band syndrome, left leg: Secondary | ICD-10-CM | POA: Diagnosis not present

## 2015-11-05 DIAGNOSIS — M461 Sacroiliitis, not elsewhere classified: Secondary | ICD-10-CM | POA: Diagnosis not present

## 2015-11-05 DIAGNOSIS — M25552 Pain in left hip: Secondary | ICD-10-CM | POA: Diagnosis not present

## 2015-11-17 ENCOUNTER — Encounter: Payer: Self-pay | Admitting: Unknown Physician Specialty

## 2015-11-17 ENCOUNTER — Ambulatory Visit (INDEPENDENT_AMBULATORY_CARE_PROVIDER_SITE_OTHER): Payer: Medicare Other | Admitting: Unknown Physician Specialty

## 2015-11-17 VITALS — BP 152/70 | HR 73 | Temp 97.6°F | Ht 64.3 in | Wt 155.6 lb

## 2015-11-17 DIAGNOSIS — M25552 Pain in left hip: Secondary | ICD-10-CM | POA: Diagnosis not present

## 2015-11-17 DIAGNOSIS — M25511 Pain in right shoulder: Secondary | ICD-10-CM | POA: Diagnosis not present

## 2015-11-17 DIAGNOSIS — M545 Low back pain: Secondary | ICD-10-CM | POA: Diagnosis not present

## 2015-11-17 DIAGNOSIS — R300 Dysuria: Secondary | ICD-10-CM

## 2015-11-17 DIAGNOSIS — M7541 Impingement syndrome of right shoulder: Secondary | ICD-10-CM | POA: Diagnosis not present

## 2015-11-17 DIAGNOSIS — M461 Sacroiliitis, not elsewhere classified: Secondary | ICD-10-CM | POA: Diagnosis not present

## 2015-11-17 DIAGNOSIS — N3090 Cystitis, unspecified without hematuria: Secondary | ICD-10-CM | POA: Diagnosis not present

## 2015-11-17 DIAGNOSIS — M25652 Stiffness of left hip, not elsewhere classified: Secondary | ICD-10-CM | POA: Diagnosis not present

## 2015-11-17 DIAGNOSIS — M7632 Iliotibial band syndrome, left leg: Secondary | ICD-10-CM | POA: Diagnosis not present

## 2015-11-17 LAB — UA/M W/RFLX CULTURE, ROUTINE
Bilirubin, UA: NEGATIVE
Glucose, UA: NEGATIVE
Ketones, UA: NEGATIVE
Leukocytes, UA: NEGATIVE
Nitrite, UA: NEGATIVE
Protein, UA: NEGATIVE
RBC, UA: NEGATIVE
Specific Gravity, UA: 1.01 (ref 1.005–1.030)
Urobilinogen, Ur: 0.2 mg/dL (ref 0.2–1.0)
pH, UA: 7 (ref 5.0–7.5)

## 2015-11-17 MED ORDER — NITROFURANTOIN MONOHYD MACRO 100 MG PO CAPS: 100.0000 mg | ORAL_CAPSULE | Freq: Two times a day (BID) | ORAL | Status: DC

## 2015-11-17 MED ORDER — FLUCONAZOLE 150 MG PO TABS: 150.0000 mg | ORAL_TABLET | Freq: Once | ORAL | Status: DC

## 2015-11-17 NOTE — Progress Notes (Signed)
   BP 152/70 mmHg  Pulse 73  Temp(Src) 97.6 F (36.4 C)  Ht 5' 4.3" (1.633 m)  Wt 155 lb 9.6 oz (70.58 kg)  BMI 26.47 kg/m2  SpO2 98%  LMP  (LMP Unknown)   Subjective:    Patient ID: Julia Deleon, female    DOB: 1945/08/16, 70 y.o.   MRN: [id number]  HPI: Julia Deleon is a 70 y.o. female  Chief Complaint  Patient presents with  . Urinary Tract Infection    pt states she has had some burning with urination that started Friday    UTI Pt started Macrobid on Friday due to having the above symptoms.  She is better today.  She is using post coital Macrobid and now taking it once a day.    She feels her BP is up because she was rushing.  She does not check her BP at home but OK with Dr. Jabier Mutton.    Relevant past medical, surgical, family and social history reviewed and updated as indicated. Interim medical history since our last visit reviewed. Allergies and medications reviewed and updated.  Review of Systems  Per HPI unless specifically indicated above     Objective:    BP 152/70 mmHg  Pulse 73  Temp(Src) 97.6 F (36.4 C)  Ht 5' 4.3" (1.633 m)  Wt 155 lb 9.6 oz (70.58 kg)  BMI 26.47 kg/m2  SpO2 98%  LMP  (LMP Unknown)  Wt Readings from Last 3 Encounters:  11/17/15 155 lb 9.6 oz (70.58 kg)  09/10/15 155 lb 6.4 oz (70.489 kg)  06/12/15 153 lb (69.4 kg)    Physical Exam  Constitutional: She is oriented to person, place, and time. She appears well-developed and well-nourished. No distress.  HENT:  Head: Normocephalic and atraumatic.  Eyes: Conjunctivae and lids are normal. Right eye exhibits no discharge. Left eye exhibits no discharge. No scleral icterus.  Cardiovascular: Normal rate.   Pulmonary/Chest: Effort normal.  Abdominal: Normal appearance. There is no splenomegaly or hepatomegaly.  Musculoskeletal: Normal range of motion.  Neurological: She is alert and oriented to person, place, and time.  Skin: Skin is intact. No rash noted. No pallor.  Psychiatric: She has  a normal mood and affect. Her behavior is normal. Judgment and thought content normal.   Urine is negative     Assessment & Plan:   Problem List Items Addressed This Visit    None    Visit Diagnoses    Burning with urination    -  Primary    Relevant Orders    UA/M w/rflx Culture, Routine    Cystitis        Urine negative but using Macrobid.          Follow up plan: No Follow-up on file.

## 2015-11-19 DIAGNOSIS — M7632 Iliotibial band syndrome, left leg: Secondary | ICD-10-CM | POA: Diagnosis not present

## 2015-11-19 DIAGNOSIS — M25552 Pain in left hip: Secondary | ICD-10-CM | POA: Diagnosis not present

## 2015-11-19 DIAGNOSIS — M25652 Stiffness of left hip, not elsewhere classified: Secondary | ICD-10-CM | POA: Diagnosis not present

## 2015-11-19 DIAGNOSIS — M545 Low back pain: Secondary | ICD-10-CM | POA: Diagnosis not present

## 2015-11-19 DIAGNOSIS — M7541 Impingement syndrome of right shoulder: Secondary | ICD-10-CM | POA: Diagnosis not present

## 2015-11-19 DIAGNOSIS — M461 Sacroiliitis, not elsewhere classified: Secondary | ICD-10-CM | POA: Diagnosis not present

## 2015-11-25 DIAGNOSIS — M461 Sacroiliitis, not elsewhere classified: Secondary | ICD-10-CM | POA: Diagnosis not present

## 2015-11-25 DIAGNOSIS — M25552 Pain in left hip: Secondary | ICD-10-CM | POA: Diagnosis not present

## 2015-11-25 DIAGNOSIS — M7541 Impingement syndrome of right shoulder: Secondary | ICD-10-CM | POA: Diagnosis not present

## 2015-11-25 DIAGNOSIS — M7632 Iliotibial band syndrome, left leg: Secondary | ICD-10-CM | POA: Diagnosis not present

## 2015-11-25 DIAGNOSIS — M545 Low back pain: Secondary | ICD-10-CM | POA: Diagnosis not present

## 2015-11-25 DIAGNOSIS — M25652 Stiffness of left hip, not elsewhere classified: Secondary | ICD-10-CM | POA: Diagnosis not present

## 2015-11-27 DIAGNOSIS — M461 Sacroiliitis, not elsewhere classified: Secondary | ICD-10-CM | POA: Diagnosis not present

## 2015-11-27 DIAGNOSIS — M25552 Pain in left hip: Secondary | ICD-10-CM | POA: Diagnosis not present

## 2015-11-27 DIAGNOSIS — M25652 Stiffness of left hip, not elsewhere classified: Secondary | ICD-10-CM | POA: Diagnosis not present

## 2015-11-27 DIAGNOSIS — M545 Low back pain: Secondary | ICD-10-CM | POA: Diagnosis not present

## 2015-11-27 DIAGNOSIS — M7541 Impingement syndrome of right shoulder: Secondary | ICD-10-CM | POA: Diagnosis not present

## 2015-11-27 DIAGNOSIS — M7632 Iliotibial band syndrome, left leg: Secondary | ICD-10-CM | POA: Diagnosis not present

## 2015-11-28 ENCOUNTER — Telehealth: Payer: Self-pay | Admitting: Unknown Physician Specialty

## 2015-11-28 NOTE — Telephone Encounter (Signed)
Called and scheduled patient an appointment with Malachy Mood for 12/01/15.

## 2015-11-28 NOTE — Telephone Encounter (Signed)
Unfortunately, I would need to see her to refer and document.  Or, if Dr. Jeananne Rama has seen her for the problem he might be able to refer.

## 2015-11-28 NOTE — Telephone Encounter (Signed)
Pt states her physical therapist suggested she see ortho for her knee.  Pt would like to talk to [place] about this.  Please call pt.

## 2015-11-28 NOTE — Telephone Encounter (Signed)
Routing to Cheryl

## 2015-12-01 ENCOUNTER — Encounter: Payer: Self-pay | Admitting: Unknown Physician Specialty

## 2015-12-01 ENCOUNTER — Ambulatory Visit (INDEPENDENT_AMBULATORY_CARE_PROVIDER_SITE_OTHER): Payer: Medicare Other | Admitting: Unknown Physician Specialty

## 2015-12-01 VITALS — BP 130/68 | HR 67 | Temp 97.5°F | Ht 64.3 in | Wt 156.0 lb

## 2015-12-01 DIAGNOSIS — M25562 Pain in left knee: Secondary | ICD-10-CM

## 2015-12-01 DIAGNOSIS — M25552 Pain in left hip: Secondary | ICD-10-CM | POA: Diagnosis not present

## 2015-12-01 DIAGNOSIS — M461 Sacroiliitis, not elsewhere classified: Secondary | ICD-10-CM | POA: Diagnosis not present

## 2015-12-01 DIAGNOSIS — M7541 Impingement syndrome of right shoulder: Secondary | ICD-10-CM | POA: Diagnosis not present

## 2015-12-01 DIAGNOSIS — M545 Low back pain: Secondary | ICD-10-CM | POA: Diagnosis not present

## 2015-12-01 DIAGNOSIS — M25652 Stiffness of left hip, not elsewhere classified: Secondary | ICD-10-CM | POA: Diagnosis not present

## 2015-12-01 DIAGNOSIS — M7632 Iliotibial band syndrome, left leg: Secondary | ICD-10-CM | POA: Diagnosis not present

## 2015-12-01 NOTE — Progress Notes (Signed)
BP 130/68   Pulse 67   Temp 97.5 F (36.4 C)   Ht 5' 4.3" (1.633 m)   Wt 156 lb (70.8 kg)   LMP  (LMP Unknown)   SpO2 100%   BMI 26.53 kg/m    Subjective:    Patient ID: Julia Deleon, female    DOB: 05/02/1946, 70 y.o.   MRN: [id number]  HPI: Julia Deleon is a 70 y.o. female  Chief Complaint  Patient presents with  . Knee Pain    left, wants referral to ortho  . Hip Pain    left   Pt is here for left knee and hip pain.  She has been having pain in left leg for about 1 year.  She is having left lateral pain.  She was told that it is probably her IT band.  It is keeping her awake.  She is doing exercises given by PT.  The PT feels that she needs an assessment by Orthopedics as she is not doing as well as she should be.  She is taking no medications.  Exercise helps, sleeping at night make it worse.  She takes an Advil in the afternoon but not at night.    Relevant past medical, surgical, family and social history reviewed and updated as indicated. Interim medical history since our last visit reviewed. Allergies and medications reviewed and updated.  Review of Systems  Per HPI unless specifically indicated above     Objective:    BP 130/68   Pulse 67   Temp 97.5 F (36.4 C)   Ht 5' 4.3" (1.633 m)   Wt 156 lb (70.8 kg)   LMP  (LMP Unknown)   SpO2 100%   BMI 26.53 kg/m   Wt Readings from Last 3 Encounters:  12/01/15 156 lb (70.8 kg)  11/17/15 155 lb 9.6 oz (70.6 kg)  09/10/15 155 lb 6.4 oz (70.5 kg)    Physical Exam  Constitutional: She is oriented to person, place, and time. She appears well-developed and well-nourished. No distress.  HENT:  Head: Normocephalic and atraumatic.  Eyes: Conjunctivae and lids are normal. Right eye exhibits no discharge. Left eye exhibits no discharge. No scleral icterus.  Cardiovascular: Normal rate.   Pulmonary/Chest: Effort normal.  Abdominal: Normal appearance. There is no splenomegaly or hepatomegaly.  Musculoskeletal:   Left hip: She exhibits decreased range of motion. She exhibits normal strength, no tenderness and no bony tenderness.  Neurological: She is alert and oriented to person, place, and time.  Skin: Skin is intact. No rash noted. No pallor.  Psychiatric: She has a normal mood and affect. Her behavior is normal. Judgment and thought content normal.    Results for orders placed or performed in visit on 11/17/15  UA/M w/rflx Culture, Routine  Result Value Ref Range   Specific Gravity, UA 1.010 1.005 - 1.030   pH, UA 7.0 5.0 - 7.5   Color, UA Yellow Yellow   Appearance Ur Clear Clear   Leukocytes, UA Negative Negative   Protein, UA Negative Negative/Trace   Glucose, UA Negative Negative   Ketones, UA Negative Negative   RBC, UA Negative Negative   Bilirubin, UA Negative Negative   Urobilinogen, Ur 0.2 0.2 - 1.0 mg/dL   Nitrite, UA Negative Negative      Assessment & Plan:   Problem List Items Addressed This Visit    None    Visit Diagnoses    Left knee pain    -  Primary  Failed PT   Relevant Orders   Ambulatory referral to Orthopedics       Follow up plan: Return if symptoms worsen or fail to improve.

## 2015-12-04 ENCOUNTER — Ambulatory Visit (INDEPENDENT_AMBULATORY_CARE_PROVIDER_SITE_OTHER): Payer: Medicare Other | Admitting: Family Medicine

## 2015-12-04 ENCOUNTER — Telehealth: Payer: Self-pay | Admitting: Family Medicine

## 2015-12-04 ENCOUNTER — Encounter: Payer: Self-pay | Admitting: Family Medicine

## 2015-12-04 VITALS — BP 136/71 | HR 68 | Temp 97.7°F | Wt 155.0 lb

## 2015-12-04 DIAGNOSIS — M25652 Stiffness of left hip, not elsewhere classified: Secondary | ICD-10-CM | POA: Diagnosis not present

## 2015-12-04 DIAGNOSIS — M7541 Impingement syndrome of right shoulder: Secondary | ICD-10-CM | POA: Diagnosis not present

## 2015-12-04 DIAGNOSIS — M25552 Pain in left hip: Secondary | ICD-10-CM | POA: Diagnosis not present

## 2015-12-04 DIAGNOSIS — N898 Other specified noninflammatory disorders of vagina: Secondary | ICD-10-CM

## 2015-12-04 DIAGNOSIS — M7632 Iliotibial band syndrome, left leg: Secondary | ICD-10-CM | POA: Diagnosis not present

## 2015-12-04 DIAGNOSIS — M461 Sacroiliitis, not elsewhere classified: Secondary | ICD-10-CM | POA: Diagnosis not present

## 2015-12-04 DIAGNOSIS — M545 Low back pain: Secondary | ICD-10-CM | POA: Diagnosis not present

## 2015-12-04 MED ORDER — CLOBETASOL PROPIONATE 0.05 % EX OINT: TOPICAL_OINTMENT | Freq: Every day | CUTANEOUS | Status: DC

## 2015-12-04 NOTE — Telephone Encounter (Signed)
Pt called stated she would like a call back from [place] regarding a little tiny blister in her vaginal area. Stated she has never had that before in her life. Would like to know what to do for it. Pt informed she would likely needed to be seen. Thanks.

## 2015-12-04 NOTE — Patient Instructions (Signed)
Follow up in 2 weeks for recheck

## 2015-12-04 NOTE — Telephone Encounter (Signed)
Patient to see RL at 3:30 today

## 2015-12-04 NOTE — Progress Notes (Signed)
BP 136/71   Pulse 68   Temp 97.7 F (36.5 C)   Wt 155 lb (70.3 kg)   LMP  (LMP Unknown)   SpO2 100%   BMI 26.36 kg/m    Subjective:    Patient ID: Julia Deleon, female    DOB: 04-10-1946, 70 y.o.   MRN: [id number]  HPI: Julia Deleon is a 70 y.o. female  Chief Complaint  Patient presents with  . Other    bump in the vaginal area, noticed Tues or Wed. She does wear compression hose. Not painful, but feels irritated. Some irritation with urination. No itching or burning.   Patient presents with a sore place on her left labia that she first noticed a day or so ago. Denies any itching or burning, abnormal discharge, or urinary symptoms. Wears full set of compression hose daily, so frequently gets yeast infections and irritation during hot months. Has not been using any creams since she first noticed this spot. Tries to control yeast issues with a pH balanced diet.   Relevant past medical, surgical, family and social history reviewed and updated as indicated. Interim medical history since our last visit reviewed. Allergies and medications reviewed and updated.  Review of Systems  Constitutional: Negative for chills and fever.  Respiratory: Negative.   Cardiovascular: Negative.   Genitourinary: Positive for genital sores and vaginal pain. Negative for dysuria, frequency, hematuria, pelvic pain and urgency.  Musculoskeletal: Negative.   Neurological: Negative.   Psychiatric/Behavioral: Negative.     Per HPI unless specifically indicated above     Objective:    BP 136/71   Pulse 68   Temp 97.7 F (36.5 C)   Wt 155 lb (70.3 kg)   LMP  (LMP Unknown)   SpO2 100%   BMI 26.36 kg/m   Wt Readings from Last 3 Encounters:  12/04/15 155 lb (70.3 kg)  12/01/15 156 lb (70.8 kg)  11/17/15 155 lb 9.6 oz (70.6 kg)    Physical Exam  Constitutional: She is oriented to person, place, and time. She appears well-developed and well-nourished. No distress.  HENT:  Head: Atraumatic.  Eyes:  Conjunctivae are normal. No scleral icterus.  Neck: Normal range of motion. Neck supple.  Cardiovascular: Normal rate and normal heart sounds.   Pulmonary/Chest: No respiratory distress.  Genitourinary: Vaginal discharge (thin white discharge present) found.  Genitourinary Comments: Small, well demarcated white lesion with mild surrounding erythema on left internal labial fold  Musculoskeletal: Normal range of motion.  Neurological: She is alert and oriented to person, place, and time.  Skin: Skin is warm and dry.  Psychiatric: She has a normal mood and affect. Her behavior is normal.  Nursing note and vitals reviewed.   Results for orders placed or performed in visit on 11/17/15  UA/M w/rflx Culture, Routine  Result Value Ref Range   Specific Gravity, UA 1.010 1.005 - 1.030   pH, UA 7.0 5.0 - 7.5   Color, UA Yellow Yellow   Appearance Ur Clear Clear   Leukocytes, UA Negative Negative   Protein, UA Negative Negative/Trace   Glucose, UA Negative Negative   Ketones, UA Negative Negative   RBC, UA Negative Negative   Bilirubin, UA Negative Negative   Urobilinogen, Ur 0.2 0.2 - 1.0 mg/dL   Nitrite, UA Negative Negative      Assessment & Plan:   Problem List Items Addressed This Visit    None    Visit Diagnoses    Vaginal lesion    -  Primary   Relevant Medications   clobetasol ointment (TEMOVATE) 0.05 % (Start on 12/04/2015 10:00 PM)   Other Relevant Orders   WET PREP FOR TRICH, YEAST, CLUE     Wet prep negative, area looks inflammatory rather than infectious. Will do a trial of clobetasol ointment to affected area once daily for 2 weeks, then recheck area for progress.    Follow up plan: Return in about 2 weeks (around 12/18/2015) for Re-check.

## 2015-12-05 LAB — WET PREP FOR TRICH, YEAST, CLUE
Clue Cell Exam: NEGATIVE
Trichomonas Exam: NEGATIVE
Yeast Exam: NEGATIVE

## 2015-12-08 DIAGNOSIS — M25552 Pain in left hip: Secondary | ICD-10-CM | POA: Diagnosis not present

## 2015-12-08 DIAGNOSIS — M461 Sacroiliitis, not elsewhere classified: Secondary | ICD-10-CM | POA: Diagnosis not present

## 2015-12-08 DIAGNOSIS — M545 Low back pain: Secondary | ICD-10-CM | POA: Diagnosis not present

## 2015-12-08 DIAGNOSIS — M7632 Iliotibial band syndrome, left leg: Secondary | ICD-10-CM | POA: Diagnosis not present

## 2015-12-08 DIAGNOSIS — M7541 Impingement syndrome of right shoulder: Secondary | ICD-10-CM | POA: Diagnosis not present

## 2015-12-08 DIAGNOSIS — M25652 Stiffness of left hip, not elsewhere classified: Secondary | ICD-10-CM | POA: Diagnosis not present

## 2015-12-09 ENCOUNTER — Telehealth: Payer: Self-pay | Admitting: Family Medicine

## 2015-12-09 NOTE — Telephone Encounter (Signed)
Pt called stated she is having a burning sensation with the ointment that was prescribed to her. Please call pt back as soon as possible. Thanks.

## 2015-12-10 DIAGNOSIS — M25652 Stiffness of left hip, not elsewhere classified: Secondary | ICD-10-CM | POA: Diagnosis not present

## 2015-12-10 DIAGNOSIS — M7541 Impingement syndrome of right shoulder: Secondary | ICD-10-CM | POA: Diagnosis not present

## 2015-12-10 DIAGNOSIS — M545 Low back pain: Secondary | ICD-10-CM | POA: Diagnosis not present

## 2015-12-10 DIAGNOSIS — M25552 Pain in left hip: Secondary | ICD-10-CM | POA: Diagnosis not present

## 2015-12-10 DIAGNOSIS — M7632 Iliotibial band syndrome, left leg: Secondary | ICD-10-CM | POA: Diagnosis not present

## 2015-12-10 DIAGNOSIS — M25511 Pain in right shoulder: Secondary | ICD-10-CM | POA: Diagnosis not present

## 2015-12-10 DIAGNOSIS — M461 Sacroiliitis, not elsewhere classified: Secondary | ICD-10-CM | POA: Diagnosis not present

## 2015-12-10 NOTE — Telephone Encounter (Signed)
Routing to provider  

## 2015-12-10 NOTE — Telephone Encounter (Signed)
Patient states it's doing much better. She thinks it might have been a blood blister, it seemed like it "popped" and it bleed some. Advised patient to d/c the ointment for now per Apolonio Schneiders and call us back and let us know if it comes back or worsens. Patient understood and agreed.

## 2015-12-18 ENCOUNTER — Encounter: Payer: Self-pay | Admitting: Family Medicine

## 2015-12-18 ENCOUNTER — Ambulatory Visit (INDEPENDENT_AMBULATORY_CARE_PROVIDER_SITE_OTHER): Payer: Medicare Other | Admitting: Family Medicine

## 2015-12-18 VITALS — BP 130/71 | HR 64 | Temp 98.7°F | Wt 155.0 lb

## 2015-12-18 DIAGNOSIS — N898 Other specified noninflammatory disorders of vagina: Secondary | ICD-10-CM | POA: Diagnosis not present

## 2015-12-18 NOTE — Patient Instructions (Signed)
Follow up as needed

## 2015-12-18 NOTE — Progress Notes (Signed)
   BP 130/71   Pulse 64   Temp 98.7 F (37.1 C)   Wt 155 lb (70.3 kg)   LMP  (LMP Unknown)   SpO2 99%   BMI 26.36 kg/m    Subjective:    Patient ID: Julia Deleon, female    DOB: 1945/06/15, 70 y.o.   MRN: [id number]  HPI: Julia Deleon is a 70 y.o. female  Chief Complaint  Patient presents with  . Follow-up    recheck of vaginal lesion, she states it's much better. She thinks it was a blood blister that popped. She has stopped the cream due to side effects.    Patient presents for 2 week f/u of vaginal lesion. Used the steroid cream for about a week but d/c'd it after that due to irritation. States the lesion has now gone away and she is not having any further symptoms.   Relevant past medical, surgical, family and social history reviewed and updated as indicated. Interim medical history since our last visit reviewed. Allergies and medications reviewed and updated.  Review of Systems  Constitutional: Negative.   Respiratory: Negative.   Cardiovascular: Negative.   Gastrointestinal: Negative.   Genitourinary: Negative.   Psychiatric/Behavioral: Negative.     Per HPI unless specifically indicated above     Objective:    BP 130/71   Pulse 64   Temp 98.7 F (37.1 C)   Wt 155 lb (70.3 kg)   LMP  (LMP Unknown)   SpO2 99%   BMI 26.36 kg/m   Wt Readings from Last 3 Encounters:  12/18/15 155 lb (70.3 kg)  12/04/15 155 lb (70.3 kg)  12/01/15 156 lb (70.8 kg)    Physical Exam  Constitutional: She is oriented to person, place, and time. She appears well-developed and well-nourished.  HENT:  Head: Atraumatic.  Eyes: Conjunctivae are normal. No scleral icterus.  Cardiovascular: Normal rate.   Pulmonary/Chest: Effort normal. No respiratory distress.  Genitourinary: Vagina normal.  Musculoskeletal: Normal range of motion.  Neurological: She is alert and oriented to person, place, and time.  Skin: Skin is warm and dry.  Psychiatric: She has a normal mood and affect. Her  behavior is normal.  Nursing note and vitals reviewed.       Assessment & Plan:   Problem List Items Addressed This Visit    None    Visit Diagnoses    Vaginal lesion    -  Primary   Resolved. Patient to follow up if any evidence of recurrence in the future.        Follow up plan: Return if symptoms worsen or fail to improve.

## 2015-12-25 ENCOUNTER — Other Ambulatory Visit: Payer: Self-pay

## 2015-12-25 MED ORDER — ESTRADIOL 0.1 MG/GM VA CREA: 0.5000 | TOPICAL_CREAM | VAGINAL | 12 refills | Status: DC

## 2016-01-23 DIAGNOSIS — M7632 Iliotibial band syndrome, left leg: Secondary | ICD-10-CM | POA: Diagnosis not present

## 2016-01-23 DIAGNOSIS — M1712 Unilateral primary osteoarthritis, left knee: Secondary | ICD-10-CM | POA: Diagnosis not present

## 2016-01-23 DIAGNOSIS — M5440 Lumbago with sciatica, unspecified side: Secondary | ICD-10-CM | POA: Diagnosis not present

## 2016-01-23 DIAGNOSIS — M25562 Pain in left knee: Secondary | ICD-10-CM | POA: Diagnosis not present

## 2016-01-23 DIAGNOSIS — M7501 Adhesive capsulitis of right shoulder: Secondary | ICD-10-CM | POA: Diagnosis not present

## 2016-02-04 DIAGNOSIS — M545 Low back pain: Secondary | ICD-10-CM | POA: Diagnosis not present

## 2016-02-04 DIAGNOSIS — M25611 Stiffness of right shoulder, not elsewhere classified: Secondary | ICD-10-CM | POA: Diagnosis not present

## 2016-02-04 DIAGNOSIS — M4807 Spinal stenosis, lumbosacral region: Secondary | ICD-10-CM | POA: Diagnosis not present

## 2016-02-04 DIAGNOSIS — M25511 Pain in right shoulder: Secondary | ICD-10-CM | POA: Diagnosis not present

## 2016-02-04 DIAGNOSIS — M7501 Adhesive capsulitis of right shoulder: Secondary | ICD-10-CM | POA: Diagnosis not present

## 2016-02-11 ENCOUNTER — Ambulatory Visit (INDEPENDENT_AMBULATORY_CARE_PROVIDER_SITE_OTHER): Payer: Medicare Other | Admitting: Obstetrics and Gynecology

## 2016-02-11 VITALS — BP 122/64 | HR 68 | Ht 64.0 in | Wt 154.5 lb

## 2016-02-11 DIAGNOSIS — N762 Acute vulvitis: Secondary | ICD-10-CM

## 2016-02-11 DIAGNOSIS — M25611 Stiffness of right shoulder, not elsewhere classified: Secondary | ICD-10-CM | POA: Diagnosis not present

## 2016-02-11 DIAGNOSIS — M4807 Spinal stenosis, lumbosacral region: Secondary | ICD-10-CM | POA: Diagnosis not present

## 2016-02-11 DIAGNOSIS — M25511 Pain in right shoulder: Secondary | ICD-10-CM | POA: Diagnosis not present

## 2016-02-11 DIAGNOSIS — M545 Low back pain: Secondary | ICD-10-CM | POA: Diagnosis not present

## 2016-02-11 DIAGNOSIS — M7501 Adhesive capsulitis of right shoulder: Secondary | ICD-10-CM | POA: Diagnosis not present

## 2016-02-11 MED ORDER — NYSTATIN-TRIAMCINOLONE 100000-0.1 UNIT/GM-% EX OINT: 1.0000 "application " | TOPICAL_OINTMENT | Freq: Two times a day (BID) | CUTANEOUS | 0 refills | Status: DC

## 2016-02-11 NOTE — Progress Notes (Signed)
GYN ENCOUNTER NOTE  Subjective:       Julia Deleon is a 70 y.o. No obstetric history on file. female is here for gynecologic evaluation of the following issues:  1. Vulvovaginitis  December the patient has noted problems with vulvar irritation without significant vaginal discharge. She is using Estrace cream intravaginal for vaginal atrophy. She recently saw her primary care be a steroid cream to be applied topically externally without any definitive diagnosis. Patient does wear surgical support hose and she notes that it has been very difficult the summer with temperatures exceeding 22F.Marland Kitchen     Gynecologic History No LMP recorded (lmp unknown). Patient is postmenopausal.  Obstetric History OB History  No data available    Past Medical History:  Diagnosis Date  . COPD (chronic obstructive pulmonary disease) ([place])   . Fuch's endothelial dystrophy   . Hyperlipidemia   . Hypertension     Past Surgical History:  Procedure Laterality Date  . APPENDECTOMY    . CHOLECYSTECTOMY    . EYE SURGERY    . FOOT SURGERY Right   . laser vein surgery    . NASAL SINUS SURGERY    . TONSILLECTOMY      Current Outpatient Prescriptions on File Prior to Visit  Medication Sig Dispense Refill  . amLODipine (NORVASC) 5 MG tablet Take 1 tablet (5 mg total) by mouth daily. 90 tablet 4  . azelastine (ASTELIN) 0.1 % nasal spray     . Azelastine-Fluticasone (DYMISTA) 137-50 MCG/ACT SUSP Place 1 spray into the nose 2 (two) times daily.    . chlorzoxazone (PARAFON) 500 MG tablet Take 500 mg by mouth 4 (four) times daily as needed for muscle spasms.    Marland Kitchen estradiol (ESTRACE VAGINAL) 0.1 MG/GM vaginal cream Place 0.5 Applicatorfuls vaginally 3 (three) times a week. 42.5 g 12  . fexofenadine-pseudoephedrine (ALLEGRA-D) 60-120 MG per tablet Take 1 tablet by mouth daily.     Marland Kitchen levothyroxine (SYNTHROID, LEVOTHROID) 75 MCG tablet Take 1 tablet (75 mcg total) by mouth daily. 90 tablet 4  . losartan (COZAAR) 100 MG  tablet Take 1 tablet (100 mg total) by mouth daily. 90 tablet 4  . omega-3 acid ethyl esters (LOVAZA) 1 g capsule TAKE 4 CAPSULES BY MOUTH DAILY 120 capsule 12  . oxybutynin (DITROPAN XL) 15 MG 24 hr tablet Take 1 tablet (15 mg total) by mouth at bedtime. 30 tablet 1  . PROGESTERONE MICRONIZED PO Apply 4 % topically.     No current facility-administered medications on file prior to visit.     Allergies  Allergen Reactions  . Amoxicillin-Pot Clavulanate Nausea Only  . Levofloxacin Hives  . Simvastatin Itching    Social History   Social History  . Marital status: Married    Spouse name: N/A  . Number of children: N/A  . Years of education: N/A   Occupational History  . Not on file.   Social History Main Topics  . Smoking status: Never Smoker  . Smokeless tobacco: Never Used  . Alcohol use No  . Drug use: No  . Sexual activity: Yes    Birth control/ protection: Post-menopausal   Other Topics Concern  . Not on file   Social History Narrative  . No narrative on file    Family History  Problem Relation Age of Onset  . Cancer Mother     lung  . CAD Mother   . Stroke Father   . Cancer Sister     breast  .  Varicose Veins Son   . Heart disease Maternal Grandmother   . Cancer Paternal Grandmother     throat  . Emphysema Paternal Grandfather   . Fibromyalgia Sister     The following portions of the patient's history were reviewed and updated as appropriate: allergies, current medications, past family history, past medical history, past social history, past surgical history and problem list.  Review of Systems Review of Systems -Per history of present illness  Objective:   BP 122/64   Pulse 68   Ht 5\' 4"  (1.626 m)   Wt 154 lb 8 oz (70.1 kg)   LMP  (LMP Unknown)   BMI 26.52 kg/m  CONSTITUTIONAL: Well-developed, well-nourished female in no acute distress.  HENT:  Normocephalic, atraumatic.  NECK: Not examined SKIN: Skin is warm and dry. No rash noted. Not  diaphoretic. No erythema. No pallor. [place]: Alert and oriented to person, place, and time. PSYCHIATRIC: Normal mood and affect. Normal behavior. Normal judgment and thought content. CARDIOVASCULAR:Not Examined RESPIRATORY: Not Examined BREASTS: Not Examined ABDOMEN: Soft, non distended; Non tender.  No Organomegaly. PELVIC:  External Genitalia: Vulvar hyperemia; chronic inflammatory changes of posterior fourchette and perianal region; no satellite lesions; no ulcerations  BUS: Small urethral caruncle   Vagina: Minimal white secretions and vaginal vault; mild atrophy  Cervix: Normal  Uterus: Not examined  Adnexa: Not examined  RV: Mild symmetric external hyperemic skin change  Bladder: Nontender MUSCULOSKELETAL: Normal range of motion. No tenderness.  No cyanosis, clubbing, or edema.  PROCEDURE: Wet prep KOH-no hyphae Normal saline-few white blood cells; normal epithelial cells; no clue cells; no Trichomonas   Assessment:   1. Acute vulvitis; may be related to hot weather and surgical support hose use; no obvious abnormalities on wet prep  2. Weight gain; multiple questions regarding HRT addressed     Plan:   1. Wet prep-negative 2. Nu Swab 3. Nystatin/triamcinolone cream twice a day for 14 days 4. Return in 2 weeks for follow-up; is still symptomatic, we'll consider vulvar biopsy  A total of 25 minutes were spent face-to-face with the patient during this encounter and over half of that time involved counseling and coordination of care.

## 2016-02-11 NOTE — Addendum Note (Signed)
Addended by: Elouise Munroe on: 02/11/2016 12:15 PM   Modules accepted: Orders

## 2016-02-11 NOTE — Patient Instructions (Signed)
1. Return in 2 weeks for follow-up 2. Use nystatin/triamcinolone cream twice a day for 2 weeks applied topically to the vulva

## 2016-02-13 DIAGNOSIS — M4807 Spinal stenosis, lumbosacral region: Secondary | ICD-10-CM | POA: Diagnosis not present

## 2016-02-13 DIAGNOSIS — M25611 Stiffness of right shoulder, not elsewhere classified: Secondary | ICD-10-CM | POA: Diagnosis not present

## 2016-02-13 DIAGNOSIS — M545 Low back pain: Secondary | ICD-10-CM | POA: Diagnosis not present

## 2016-02-13 DIAGNOSIS — M25511 Pain in right shoulder: Secondary | ICD-10-CM | POA: Diagnosis not present

## 2016-02-13 DIAGNOSIS — M7501 Adhesive capsulitis of right shoulder: Secondary | ICD-10-CM | POA: Diagnosis not present

## 2016-02-16 DIAGNOSIS — M545 Low back pain: Secondary | ICD-10-CM | POA: Diagnosis not present

## 2016-02-16 DIAGNOSIS — M7501 Adhesive capsulitis of right shoulder: Secondary | ICD-10-CM | POA: Diagnosis not present

## 2016-02-16 DIAGNOSIS — M4807 Spinal stenosis, lumbosacral region: Secondary | ICD-10-CM | POA: Diagnosis not present

## 2016-02-16 DIAGNOSIS — M25611 Stiffness of right shoulder, not elsewhere classified: Secondary | ICD-10-CM | POA: Diagnosis not present

## 2016-02-16 DIAGNOSIS — M25511 Pain in right shoulder: Secondary | ICD-10-CM | POA: Diagnosis not present

## 2016-02-18 LAB — NUSWAB BV AND CANDIDA, NAA
Candida albicans, NAA: NEGATIVE
Candida glabrata, NAA: NEGATIVE

## 2016-02-19 ENCOUNTER — Telehealth: Payer: Self-pay | Admitting: Obstetrics and Gynecology

## 2016-02-19 DIAGNOSIS — M25511 Pain in right shoulder: Secondary | ICD-10-CM | POA: Diagnosis not present

## 2016-02-19 DIAGNOSIS — M7501 Adhesive capsulitis of right shoulder: Secondary | ICD-10-CM | POA: Diagnosis not present

## 2016-02-19 DIAGNOSIS — M4807 Spinal stenosis, lumbosacral region: Secondary | ICD-10-CM | POA: Diagnosis not present

## 2016-02-19 DIAGNOSIS — M25611 Stiffness of right shoulder, not elsewhere classified: Secondary | ICD-10-CM | POA: Diagnosis not present

## 2016-02-19 DIAGNOSIS — M545 Low back pain: Secondary | ICD-10-CM | POA: Diagnosis not present

## 2016-02-19 NOTE — Telephone Encounter (Signed)
Returning your call. °

## 2016-02-20 NOTE — Telephone Encounter (Signed)
Pt aware of neg nuswab.

## 2016-03-02 ENCOUNTER — Ambulatory Visit (INDEPENDENT_AMBULATORY_CARE_PROVIDER_SITE_OTHER): Payer: Medicare Other | Admitting: Obstetrics and Gynecology

## 2016-03-02 ENCOUNTER — Encounter: Payer: Self-pay | Admitting: Obstetrics and Gynecology

## 2016-03-02 VITALS — BP 153/66 | HR 73 | Ht 64.0 in | Wt 155.3 lb

## 2016-03-02 DIAGNOSIS — N762 Acute vulvitis: Secondary | ICD-10-CM | POA: Diagnosis not present

## 2016-03-02 DIAGNOSIS — M545 Low back pain: Secondary | ICD-10-CM | POA: Diagnosis not present

## 2016-03-02 DIAGNOSIS — M25511 Pain in right shoulder: Secondary | ICD-10-CM | POA: Diagnosis not present

## 2016-03-02 DIAGNOSIS — M4807 Spinal stenosis, lumbosacral region: Secondary | ICD-10-CM | POA: Diagnosis not present

## 2016-03-02 DIAGNOSIS — M7501 Adhesive capsulitis of right shoulder: Secondary | ICD-10-CM | POA: Diagnosis not present

## 2016-03-02 DIAGNOSIS — M25611 Stiffness of right shoulder, not elsewhere classified: Secondary | ICD-10-CM | POA: Diagnosis not present

## 2016-03-02 NOTE — Patient Instructions (Signed)
1. Return as scheduled or for any new gynecologic issues

## 2016-03-02 NOTE — Progress Notes (Signed)
Chief complaint: 1. Follow-up on vaginitis  Patient presents for two-week follow-up after acute treatment for vulvitis. Patient uses nystatin/triamcinolone cream topically to the vulva with resolution of symptoms over the past 2 weeks. Previous evaluation was notable for a new swab which came back negative for yeast.  Past medical history, past surgical history, problem list, medications, and allergies are reviewed  OBJECTIVE: BP (!) 153/66   Pulse 73   Ht 5\' 4"  (1.626 m)   Wt 155 lb 4.8 oz (70.4 kg)   LMP  (LMP Unknown)   BMI 26.66 kg/m  Pleasant well-appearing female in no acute distress Pelvic exam: External genitalia-normal without evidence of inflammation or epithelial skin breakdown or rash BUS-all urethral caruncle Vagina-mild atrophic changes  ASSESSMENT: 1. Vulvitis, resolved 2. Nu swab negative for monilia and BV  PLAN: 1. Continue with healthy diet 2. Use surgical support hose with care to minimize vulvar inflammation 3. Return as needed for any gynecologic issues  A total of 15 minutes were spent face-to-face with the patient during this encounter and over half of that time dealt with counseling and coordination of care.  Brayton Mars, MD  Note: This dictation was prepared with Dragon dictation along with smaller phrase technology. Any transcriptional errors that result from this process are unintentional.

## 2016-03-18 DIAGNOSIS — H1851 Endothelial corneal dystrophy: Secondary | ICD-10-CM | POA: Diagnosis not present

## 2016-03-25 ENCOUNTER — Ambulatory Visit (INDEPENDENT_AMBULATORY_CARE_PROVIDER_SITE_OTHER): Payer: Medicare Other | Admitting: Family Medicine

## 2016-03-25 ENCOUNTER — Encounter: Payer: Self-pay | Admitting: Family Medicine

## 2016-03-25 VITALS — BP 105/56 | HR 69 | Temp 97.6°F | Ht 64.2 in | Wt 153.2 lb

## 2016-03-25 DIAGNOSIS — E038 Other specified hypothyroidism: Secondary | ICD-10-CM

## 2016-03-25 DIAGNOSIS — R946 Abnormal results of thyroid function studies: Secondary | ICD-10-CM | POA: Diagnosis not present

## 2016-03-25 DIAGNOSIS — N183 Chronic kidney disease, stage 3 unspecified: Secondary | ICD-10-CM

## 2016-03-25 DIAGNOSIS — I10 Essential (primary) hypertension: Secondary | ICD-10-CM

## 2016-03-25 DIAGNOSIS — Z0000 Encounter for general adult medical examination without abnormal findings: Secondary | ICD-10-CM

## 2016-03-25 DIAGNOSIS — Z23 Encounter for immunization: Secondary | ICD-10-CM

## 2016-03-25 DIAGNOSIS — E785 Hyperlipidemia, unspecified: Secondary | ICD-10-CM

## 2016-03-25 DIAGNOSIS — Z1159 Encounter for screening for other viral diseases: Secondary | ICD-10-CM

## 2016-03-25 DIAGNOSIS — M5430 Sciatica, unspecified side: Secondary | ICD-10-CM | POA: Diagnosis not present

## 2016-03-25 DIAGNOSIS — R7989 Other specified abnormal findings of blood chemistry: Secondary | ICD-10-CM

## 2016-03-25 DIAGNOSIS — J449 Chronic obstructive pulmonary disease, unspecified: Secondary | ICD-10-CM

## 2016-03-25 LAB — UA/M W/RFLX CULTURE, ROUTINE
Bilirubin, UA: NEGATIVE
Glucose, UA: NEGATIVE
Ketones, UA: NEGATIVE
Leukocytes, UA: NEGATIVE
Nitrite, UA: NEGATIVE
Protein, UA: NEGATIVE
RBC, UA: NEGATIVE
Specific Gravity, UA: 1.015 (ref 1.005–1.030)
Urobilinogen, Ur: 0.2 mg/dL (ref 0.2–1.0)
pH, UA: 7 (ref 5.0–7.5)

## 2016-03-25 MED ORDER — LEVOTHYROXINE SODIUM 75 MCG PO TABS: 75.0000 ug | ORAL_TABLET | Freq: Every day | ORAL | 4 refills | Status: DC

## 2016-03-25 MED ORDER — LOSARTAN POTASSIUM 100 MG PO TABS: 100.0000 mg | ORAL_TABLET | Freq: Every day | ORAL | 4 refills | Status: DC

## 2016-03-25 MED ORDER — AMLODIPINE BESYLATE 5 MG PO TABS: 5.0000 mg | ORAL_TABLET | Freq: Every day | ORAL | 4 refills | Status: DC

## 2016-03-25 NOTE — Patient Instructions (Signed)

## 2016-03-25 NOTE — Assessment & Plan Note (Signed)
The current medical regimen is effective;  continue present plan and medications.  

## 2016-03-25 NOTE — Progress Notes (Signed)
 BP (!) 105/56 (BP Location: Left Arm, Patient Position: Sitting, Cuff Size: Normal)   Pulse 69   Temp 97.6 F (36.4 C)   Ht 5' 4.2" (1.631 m)   Wt 153 lb 3.2 oz (69.5 kg)   LMP  (LMP Unknown)   SpO2 98%   BMI 26.13 kg/m    Subjective:    Patient ID: Julia Deleon, female    DOB: 08/29/1945, 70 y.o.   MRN: [id number]  HPI: Julia Deleon is a 70 y.o. female  Chief Complaint  Patient presents with  . Medicare Wellness  AWV metrics met  Patient follow-up doing well with medications no issues concerns taking medicines faithfully without problems. Still doing some supplements following up with nephrology without problems. Orthopedics for knee pains felt to be more sciatica related and all in all though doing okay. Had breast exam and GYN with Dr. Defrancesco. Relevant past medical, surgical, family and social history reviewed and updated as indicated. Interim medical history since our last visit reviewed. Allergies and medications reviewed and updated.  Review of Systems  Constitutional: Negative.   HENT: Negative.   Eyes: Negative.   Respiratory: Negative.   Cardiovascular: Negative.   Gastrointestinal: Negative.   Endocrine: Negative.   Genitourinary: Negative.   Musculoskeletal: Negative.   Skin: Negative.   Allergic/Immunologic: Negative.   Neurological: Negative.   Hematological: Negative.   Psychiatric/Behavioral: Negative.     Per HPI unless specifically indicated above     Objective:    BP (!) 105/56 (BP Location: Left Arm, Patient Position: Sitting, Cuff Size: Normal)   Pulse 69   Temp 97.6 F (36.4 C)   Ht 5' 4.2" (1.631 m)   Wt 153 lb 3.2 oz (69.5 kg)   LMP  (LMP Unknown)   SpO2 98%   BMI 26.13 kg/m   Wt Readings from Last 3 Encounters:  03/25/16 153 lb 3.2 oz (69.5 kg)  03/02/16 155 lb 4.8 oz (70.4 kg)  02/11/16 154 lb 8 oz (70.1 kg)    Physical Exam  Constitutional: She is oriented to person, place, and time. She appears well-developed and  well-nourished.  HENT:  Head: Normocephalic and atraumatic.  Right Ear: External ear normal.  Left Ear: External ear normal.  Nose: Nose normal.  Mouth/Throat: Oropharynx is clear and moist.  Eyes: Conjunctivae and EOM are normal. Pupils are equal, round, and reactive to light.  Neck: Normal range of motion. Neck supple. Carotid bruit is not present.  Cardiovascular: Normal rate, regular rhythm and normal heart sounds.   No murmur heard. Pulmonary/Chest: Effort normal and breath sounds normal. She exhibits no mass. Right breast exhibits no mass, no skin change and no tenderness. Left breast exhibits no mass, no skin change and no tenderness. Breasts are symmetrical.  Abdominal: Soft. Bowel sounds are normal. There is no hepatosplenomegaly.  Musculoskeletal: Normal range of motion.  Neurological: She is alert and oriented to person, place, and time.  Skin: No rash noted.  Psychiatric: She has a normal mood and affect. Her behavior is normal. Judgment and thought content normal.    Results for orders placed or performed in visit on 02/11/16  NuSwab BV and Candida, NAA  Result Value Ref Range   Atopobium vaginae Low - 0 Score   BVAB 2 Low - 0 Score   Megasphaera 1 Low - 0 Score   Candida albicans, NAA Negative Negative   Candida glabrata, NAA Negative Negative      Assessment & Plan:     Problem List Items Addressed This Visit      Cardiovascular and Mediastinum   Essential hypertension - Primary    The current medical regimen is effective;  continue present plan and medications.       Relevant Medications   amLODipine (NORVASC) 5 MG tablet   losartan (COZAAR) 100 MG tablet   Other Relevant Orders   Comprehensive metabolic panel     Respiratory   COPD (chronic obstructive pulmonary disease) ([place])    The current medical regimen is effective;  continue present plan and medications.       Relevant Medications   triamcinolone (NASACORT ALLERGY 24HR) 55 MCG/ACT AERO nasal  inhaler     Endocrine   Hypothyroidism    The current medical regimen is effective;  continue present plan and medications.       Relevant Medications   levothyroxine (SYNTHROID, LEVOTHROID) 75 MCG tablet   Other Relevant Orders   Comprehensive metabolic panel     Nervous and Auditory   Sciatica    The current medical regimen is effective;  continue present plan and medications.         Genitourinary   CKD (chronic kidney disease), stage III    The current medical regimen is effective;  continue present plan and medications.       Relevant Orders   CBC with Differential/Platelet   Comprehensive metabolic panel   UA/M w/rflx Culture, Routine     Other   Hyperlipidemia    The current medical regimen is effective;  continue present plan and medications.       Relevant Medications   amLODipine (NORVASC) 5 MG tablet   losartan (COZAAR) 100 MG tablet   Other Relevant Orders   Comprehensive metabolic panel   Lipid panel    Other Visit Diagnoses    Need for influenza vaccination       Relevant Orders   Flu vaccine HIGH DOSE PF   Elevated TSH       PE (physical exam), annual           Follow up plan: Return for BMP,  Lipids, ALT, AST.

## 2016-03-26 LAB — COMPREHENSIVE METABOLIC PANEL
ALT: 21 IU/L (ref 0–32)
AST: 21 IU/L (ref 0–40)
Albumin/Globulin Ratio: 1.8 (ref 1.2–2.2)
Albumin: 4.2 g/dL (ref 3.6–4.8)
Alkaline Phosphatase: 52 IU/L (ref 39–117)
BUN/Creatinine Ratio: 25 (ref 12–28)
BUN: 27 mg/dL (ref 8–27)
Bilirubin Total: 0.8 mg/dL (ref 0.0–1.2)
CO2: 26 mmol/L (ref 18–29)
Calcium: 9.3 mg/dL (ref 8.7–10.3)
Chloride: 99 mmol/L (ref 96–106)
Creatinine, Ser: 1.1 mg/dL — ABNORMAL HIGH (ref 0.57–1.00)
GFR calc Af Amer: 60 mL/min/{1.73_m2} (ref >59)
GFR calc non Af Amer: 52 mL/min/{1.73_m2} — ABNORMAL LOW (ref >59)
Globulin, Total: 2.4 g/dL (ref 1.5–4.5)
Glucose: 85 mg/dL (ref 65–99)
Potassium: 4.3 mmol/L (ref 3.5–5.2)
Sodium: 138 mmol/L (ref 134–144)
Total Protein: 6.6 g/dL (ref 6.0–8.5)

## 2016-03-26 LAB — CBC WITH DIFFERENTIAL/PLATELET
Basophils Absolute: 0 10*3/uL (ref 0.0–0.2)
Basos: 0 %
EOS (ABSOLUTE): 0.2 10*3/uL (ref 0.0–0.4)
Eos: 3 %
Hematocrit: 38.3 % (ref 34.0–46.6)
Hemoglobin: 12.7 g/dL (ref 11.1–15.9)
Immature Grans (Abs): 0 10*3/uL (ref 0.0–0.1)
Immature Granulocytes: 1 %
Lymphocytes Absolute: 2.3 10*3/uL (ref 0.7–3.1)
Lymphs: 33 %
MCH: 30 pg (ref 26.6–33.0)
MCHC: 33.2 g/dL (ref 31.5–35.7)
MCV: 91 fL (ref 79–97)
Monocytes Absolute: 0.6 10*3/uL (ref 0.1–0.9)
Monocytes: 8 %
Neutrophils Absolute: 4 10*3/uL (ref 1.4–7.0)
Neutrophils: 55 %
Platelets: 216 10*3/uL (ref 150–379)
RBC: 4.23 x10E6/uL (ref 3.77–5.28)
RDW: 13.6 % (ref 12.3–15.4)
WBC: 7.1 10*3/uL (ref 3.4–10.8)

## 2016-03-26 LAB — LIPID PANEL
Chol/HDL Ratio: 4.9 ratio units — ABNORMAL HIGH (ref 0.0–4.4)
Cholesterol, Total: 204 mg/dL — ABNORMAL HIGH (ref 100–199)
HDL: 42 mg/dL (ref >39)
LDL Calculated: 146 mg/dL — ABNORMAL HIGH (ref 0–99)
Triglycerides: 79 mg/dL (ref 0–149)
VLDL Cholesterol Cal: 16 mg/dL (ref 5–40)

## 2016-03-26 LAB — TSH: TSH: 3.24 u[IU]/mL (ref 0.450–4.500)

## 2016-03-26 LAB — HEPATITIS C ANTIBODY: Hep C Virus Ab: <0.1 s/co ratio (ref 0.0–0.9)

## 2016-03-29 ENCOUNTER — Encounter: Payer: Self-pay | Admitting: Family Medicine

## 2016-04-22 ENCOUNTER — Ambulatory Visit (INDEPENDENT_AMBULATORY_CARE_PROVIDER_SITE_OTHER): Payer: Medicare Other | Admitting: Family Medicine

## 2016-04-22 ENCOUNTER — Encounter: Payer: Self-pay | Admitting: Family Medicine

## 2016-04-22 VITALS — BP 156/66 | HR 72 | Temp 97.4°F | Wt 158.0 lb

## 2016-04-22 DIAGNOSIS — N390 Urinary tract infection, site not specified: Secondary | ICD-10-CM | POA: Diagnosis not present

## 2016-04-22 DIAGNOSIS — R309 Painful micturition, unspecified: Secondary | ICD-10-CM | POA: Diagnosis not present

## 2016-04-22 MED ORDER — SULFAMETHOXAZOLE-TRIMETHOPRIM 800-160 MG PO TABS: 1.0000 | ORAL_TABLET | Freq: Two times a day (BID) | ORAL | 0 refills | Status: DC

## 2016-04-22 NOTE — Patient Instructions (Signed)
Follow up as needed

## 2016-04-22 NOTE — Progress Notes (Signed)
   BP (!) 156/66 (BP Location: Left Arm, Patient Position: Sitting, Cuff Size: Normal)   Pulse 72   Temp 97.4 F (36.3 Deleon)   Wt 158 lb (71.7 kg)   LMP  (LMP Unknown)   SpO2 97%   BMI 26.95 kg/m    Subjective:    Patient ID: Julia Deleon Batch, female    DOB: 11-22-1945, 70 y.o.   MRN: [id number]  HPI: Julia Deleon is a 70 y.o. female  Chief Complaint  Patient presents with  . Urinary Tract Infection    pt states she has been having back pain and pain with urination. States symptoms started Tuesday.    Traveled over the past weekend and now since Tuesday having generalized low back pain and dysuria. Denies fever, chills, N/V, hematuria. Trying to drink lots of water, but has not taken anything OTC, but does take a cranberry chew and probiotic daily. States her system is very sensitive to changes in water or holding her urine so she thinks her recent road trip caused these symptoms to come on.   Relevant past medical, surgical, family and social history reviewed and updated as indicated. Interim medical history since our last visit reviewed. Allergies and medications reviewed and updated.  Review of Systems  Constitutional: Negative.   HENT: Negative.   Respiratory: Negative.   Gastrointestinal: Negative.   Endocrine: Negative.   Genitourinary: Positive for dysuria.  Musculoskeletal: Positive for back pain.  Skin: Negative.   Neurological: Negative.   Psychiatric/Behavioral: Negative.     Per HPI unless specifically indicated above     Objective:    BP (!) 156/66 (BP Location: Left Arm, Patient Position: Sitting, Cuff Size: Normal)   Pulse 72   Temp 97.4 F (36.3 Deleon)   Wt 158 lb (71.7 kg)   LMP  (LMP Unknown)   SpO2 97%   BMI 26.95 kg/m   Wt Readings from Last 3 Encounters:  04/22/16 158 lb (71.7 kg)  03/25/16 153 lb 3.2 oz (69.5 kg)  03/02/16 155 lb 4.8 oz (70.4 kg)    Physical Exam  Constitutional: She is oriented to person, place, and time. She appears well-developed  and well-nourished.  HENT:  Head: Atraumatic.  Eyes: Conjunctivae are normal. Pupils are equal, round, and reactive to light. No scleral icterus.  Neck: Normal range of motion. Neck supple.  Cardiovascular: Normal rate.   Pulmonary/Chest: Effort normal and breath sounds normal. No respiratory distress.  Abdominal: Soft. Bowel sounds are normal. There is no tenderness.  Musculoskeletal: Normal range of motion.  No CVA tenderness  Neurological: She is alert and oriented to person, place, and time.  Skin: Skin is warm and dry.  Psychiatric: She has a normal mood and affect. Her behavior is normal.  Nursing note and vitals reviewed.     Assessment & Plan:   Problem List Items Addressed This Visit    None    Visit Diagnoses    Acute lower UTI    -  Primary   Trace leuks on U/A, await culture. Will treat with bactrim. Continue probiotic and cranberry regimen as well as staying well hydrated. F/u if no improvement   Relevant Medications   sulfamethoxazole-trimethoprim (BACTRIM DS,SEPTRA DS) 800-160 MG tablet   Other Relevant Orders   UA/M w/rflx Culture, Routine       Follow up plan: Return if symptoms worsen or fail to improve.

## 2016-04-23 ENCOUNTER — Ambulatory Visit: Payer: PRIVATE HEALTH INSURANCE | Admitting: Family Medicine

## 2016-04-26 ENCOUNTER — Telehealth: Payer: Self-pay | Admitting: Family Medicine

## 2016-04-26 LAB — UA/M W/RFLX CULTURE, ROUTINE
Bilirubin, UA: NEGATIVE
Glucose, UA: NEGATIVE
Ketones, UA: NEGATIVE
Nitrite, UA: NEGATIVE
Protein, UA: NEGATIVE
RBC, UA: NEGATIVE
Specific Gravity, UA: 1.01 (ref 1.005–1.030)
Urobilinogen, Ur: 0.2 mg/dL (ref 0.2–1.0)
pH, UA: 7 (ref 5.0–7.5)

## 2016-04-26 LAB — MICROSCOPIC EXAMINATION: RBC, UA: NONE SEEN /hpf (ref 0–?)

## 2016-04-26 LAB — URINE CULTURE, REFLEX

## 2016-04-26 NOTE — Telephone Encounter (Signed)
Patient notified of results. Feeling better. Will let us know if symptoms return and drop off a repeat U/A.

## 2016-04-26 NOTE — Telephone Encounter (Signed)
Please call pt and let her know that her urine culture grew E. Coli and it shows that it is susceptible to the bactrim we put her on. If she is still symptomatic, she can drop by a repeat sample to test. Thanks

## 2016-06-03 ENCOUNTER — Telehealth: Payer: Self-pay | Admitting: Family Medicine

## 2016-06-03 DIAGNOSIS — R600 Localized edema: Secondary | ICD-10-CM | POA: Diagnosis not present

## 2016-06-03 DIAGNOSIS — R601 Generalized edema: Secondary | ICD-10-CM | POA: Diagnosis not present

## 2016-06-03 DIAGNOSIS — I10 Essential (primary) hypertension: Secondary | ICD-10-CM | POA: Diagnosis not present

## 2016-06-03 DIAGNOSIS — N182 Chronic kidney disease, stage 2 (mild): Secondary | ICD-10-CM | POA: Diagnosis not present

## 2016-06-03 NOTE — Telephone Encounter (Signed)
Julia Deleon this is a patient of Dr Rance Muir but she asked that I have you give her a call sometime tomorrow. She did not leave any details.  Thanks   [phone]

## 2016-06-25 ENCOUNTER — Emergency Department
Admission: EM | Admit: 2016-06-25 | Discharge: 2016-06-25 | Disposition: A | Discharge status: Home / Self Care | Payer: Medicare Other | Attending: Student in an Organized Health Care Education/Training Program | Admitting: Student in an Organized Health Care Education/Training Program

## 2016-06-25 ENCOUNTER — Encounter: Payer: Self-pay | Admitting: Emergency Medicine

## 2016-06-25 DIAGNOSIS — Y999 Unspecified external cause status: Secondary | ICD-10-CM | POA: Insufficient documentation

## 2016-06-25 DIAGNOSIS — S6992XA Unspecified injury of left wrist, hand and finger(s), initial encounter: Secondary | ICD-10-CM | POA: Diagnosis present

## 2016-06-25 DIAGNOSIS — Y939 Activity, unspecified: Secondary | ICD-10-CM | POA: Insufficient documentation

## 2016-06-25 DIAGNOSIS — S61002A Unspecified open wound of left thumb without damage to nail, initial encounter: Secondary | ICD-10-CM

## 2016-06-25 DIAGNOSIS — S61102A Unspecified open wound of left thumb with damage to nail, initial encounter: Secondary | ICD-10-CM | POA: Diagnosis not present

## 2016-06-25 DIAGNOSIS — Y929 Unspecified place or not applicable: Secondary | ICD-10-CM | POA: Insufficient documentation

## 2016-06-25 DIAGNOSIS — W268XXA Contact with other sharp object(s), not elsewhere classified, initial encounter: Secondary | ICD-10-CM | POA: Insufficient documentation

## 2016-06-25 DIAGNOSIS — I129 Hypertensive chronic kidney disease with stage 1 through stage 4 chronic kidney disease, or unspecified chronic kidney disease: Secondary | ICD-10-CM | POA: Diagnosis not present

## 2016-06-25 DIAGNOSIS — J449 Chronic obstructive pulmonary disease, unspecified: Secondary | ICD-10-CM | POA: Insufficient documentation

## 2016-06-25 DIAGNOSIS — Z79899 Other long term (current) drug therapy: Secondary | ICD-10-CM | POA: Insufficient documentation

## 2016-06-25 DIAGNOSIS — N183 Chronic kidney disease, stage 3 (moderate): Secondary | ICD-10-CM | POA: Diagnosis not present

## 2016-06-25 DIAGNOSIS — E039 Hypothyroidism, unspecified: Secondary | ICD-10-CM | POA: Insufficient documentation

## 2016-06-25 MED ORDER — OXYCODONE HCL 5 MG PO TABS
5.0000 mg | ORAL_TABLET | Freq: Once | ORAL | Status: AC
  Administered 2016-06-25: 5 mg via ORAL
  Filled 2016-06-25: qty 1

## 2016-06-25 MED ORDER — LIDOCAINE-EPINEPHRINE-TETRACAINE (LET) SOLUTION
3.0000 mL | Freq: Once | NASAL | Status: AC
  Administered 2016-06-25: 3 mL via TOPICAL
  Filled 2016-06-25: qty 3

## 2016-06-25 MED ORDER — OXYCODONE-ACETAMINOPHEN 5-325 MG PO TABS: 1.0000 | ORAL_TABLET | Freq: Four times a day (QID) | ORAL | 0 refills | Status: DC | PRN

## 2016-06-25 NOTE — Discharge Instructions (Signed)
Wear the splint until a firm scab has formed and the pain has gone away. Follow up with Dr. Jeananne Rama in 2 weeks for a recheck, or sooner for any signs or concern for infection. Return to the ER for any symptoms of concern if you are unable to schedule an appointment.

## 2016-06-25 NOTE — ED Notes (Signed)
Reviewed d/c instructions, follow-up care, wound care, prescription, and signs of infection with pt. Pt verbalized understanding

## 2016-06-25 NOTE — ED Provider Notes (Signed)
[facility] Emergency Department Provider Note  ____________________________________________  Time seen: Approximately 5:56 PM  I have reviewed the triage vital signs and the nursing notes.   HISTORY  Chief Complaint Laceration   HPI Julia Deleon is a 71 y.o. female who presents to the emergency department for evaluation of left thumb injury. While using a paper slicer, she sliced the tip of her left thumb off. Tetanus is up to date.   Past Medical History:  Diagnosis Date  . COPD (chronic obstructive pulmonary disease) ([place])   . Fuch's endothelial dystrophy   . Hyperlipidemia     Patient Active Problem List   Diagnosis Date Noted  . Vaginal atrophy 09/10/2015  . Perimenopausal vasomotor symptoms 09/10/2015  . Menopause 09/10/2015  . UTI (lower urinary tract infection) 06/12/2015  . Onychomycosis due to dermatophyte 04/07/2015  . Bursitis of right shoulder 02/26/2015  . Environmental and seasonal allergies 02/26/2015  . Sciatica 11/27/2014  . Osteoporosis 11/26/2014  . COPD (chronic obstructive pulmonary disease) ([place]) 11/26/2014  . Fuch's endothelial dystrophy 11/26/2014  . Essential hypertension 11/26/2014  . Hyperlipidemia 11/26/2014  . CKD (chronic kidney disease), stage III 11/26/2014  . Hypothyroidism 11/26/2014  . Low back pain 11/26/2014    Past Surgical History:  Procedure Laterality Date  . APPENDECTOMY    . CHOLECYSTECTOMY    . EYE SURGERY    . FOOT SURGERY Right   . laser vein surgery    . NASAL SINUS SURGERY    . TONSILLECTOMY      Prior to Admission medications   Medication Sig Start Date End Date Taking? Authorizing Provider  amLODipine (NORVASC) 5 MG tablet Take 1 tablet (5 mg total) by mouth daily. 03/25/16   Guadalupe Maple, MD  Azelastine-Fluticasone (DYMISTA) 137-50 MCG/ACT SUSP Place 1 spray into the nose 2 (two) times daily.    Historical Provider, MD  chlorzoxazone (PARAFON) 500 MG tablet Take 500 mg by mouth 4  (four) times daily as needed for muscle spasms.    Historical Provider, MD  estradiol (ESTRACE VAGINAL) 0.1 MG/GM vaginal cream Place 0.5 Applicatorfuls vaginally 3 (three) times a week. 12/26/15   Alanda Slim Defrancesco, MD  fexofenadine-pseudoephedrine (ALLEGRA-D) 60-120 MG per tablet Take 1 tablet by mouth daily.     Historical Provider, MD  levothyroxine (SYNTHROID, LEVOTHROID) 75 MCG tablet Take 1 tablet (75 mcg total) by mouth daily. 03/25/16   Guadalupe Maple, MD  losartan (COZAAR) 100 MG tablet Take 1 tablet (100 mg total) by mouth daily. 03/25/16   Guadalupe Maple, MD  nystatin-triamcinolone ointment (MYCOLOG) Apply 1 application topically 2 (two) times daily. Patient not taking: Reported on 04/22/2016 02/11/16   Alanda Slim Defrancesco, MD  omega-3 acid ethyl esters (LOVAZA) 1 g capsule TAKE 4 CAPSULES BY MOUTH DAILY 10/15/15   Guadalupe Maple, MD  oxyCODONE-acetaminophen (ROXICET) 5-325 MG tablet Take 1 tablet by mouth every 6 (six) hours as needed. 06/25/16 06/25/17  Victorino Dike, FNP  PROGESTERONE MICRONIZED PO Apply 4 % topically.    Historical Provider, MD  sulfamethoxazole-trimethoprim (BACTRIM DS,SEPTRA DS) 800-160 MG tablet Take 1 tablet by mouth 2 (two) times daily. 04/22/16   Volney American, PA-C  triamcinolone (NASACORT ALLERGY 24HR) 55 MCG/ACT AERO nasal inhaler Place 2 sprays into the nose daily.    Historical Provider, MD    Allergies Amoxicillin-pot clavulanate; Levofloxacin; and Simvastatin  Family History  Problem Relation Age of Onset  . Cancer Mother     lung  .  CAD Mother   . Stroke Father   . Cancer Sister     breast  . Varicose Veins Son   . Heart disease Maternal Grandmother   . Cancer Paternal Grandmother     throat  . Emphysema Paternal Grandfather   . Fibromyalgia Sister     Social History Social History  Substance Use Topics  . Smoking status: Never Smoker  . Smokeless tobacco: Never Used  . Alcohol use No    Review of  Systems  Constitutional: Negative for fever/chills Respiratory: Negative for shortness of breath. Musculoskeletal: Positive for pain. Skin: Positive for skin avulsion Neurological: Negative for headaches, focal weakness or numbness. ____________________________________________   PHYSICAL EXAM:  VITAL SIGNS: ED Triage Vitals  Enc Vitals Group     BP 06/25/16 1707 (!) 110/59     Pulse Rate 06/25/16 1707 69     Resp 06/25/16 1707 18     Temp 06/25/16 1707 97.8 F (36.6 C)     Temp Source 06/25/16 1707 Oral     SpO2 06/25/16 1707 100 %     Weight 06/25/16 1714 158 lb (71.7 kg)     Height --      Head Circumference --      Peak Flow --      Pain Score 06/25/16 1711 10     Pain Loc --      Pain Edu? --      Excl. in [place]? --      Constitutional: Alert and oriented. Well appearing and in no acute distress. Eyes: Conjunctivae are normal. EOMI. Nose: No congestion/rhinnorhea. Mouth/Throat: Mucous membranes are moist.   Neck: No stridor. Cardiovascular: Good peripheral circulation. Respiratory: Normal respiratory effort.  No retractions. Musculoskeletal: FROM throughout, specifically the left thumb. Neurologic:  Normal speech and language. No gross focal neurologic deficits are appreciated. Skin:  Deep diagonal avulsion of the left thumb that extends through the nail. No bone exposure. Actively bleeding on exam.  ____________________________________________   LABS (all labs ordered are listed, but only abnormal results are displayed)  Labs Reviewed - No data to display ____________________________________________  EKG   ____________________________________________  RADIOLOGY  Not indicated. ____________________________________________   PROCEDURES  Procedure(s) performed:   Tourniquet applied above the MCP of the left thumb. Blood exanguinated from the tip of the thumb, creating a dry wound bed. Dermabond was applied and allowed to dry. Tourniquet removed and  thumb was again pink and warm with good sensation and movement. No bleeding returned after monitoring for about 20 minutes. Bulky dressing applied under a aluminum foam splint.  ____________________________________________   INITIAL IMPRESSION / ASSESSMENT AND PLAN / ED COURSE     Pertinent labs & imaging results that were available during my care of the patient were reviewed by me and considered in my medical decision making (see chart for details).  71 year old female presenting to the emergency department for evaluation and treatment of thumb avulsion injury. No active bleeding through the bulky bandage prior to discharge. Wound care was given. Patient is to follow-up with her primary care provider for wound check. She was advised to return to the emergency department for any symptom of concern if she is unable to see her primary care provider. ____________________________________________   FINAL CLINICAL IMPRESSION(S) / ED DIAGNOSES  Final diagnoses:  Avulsion of skin of thumb, left, initial encounter    Discharge Medication List as of 06/25/2016  7:08 PM    START taking these medications   Details  oxyCODONE-acetaminophen (ROXICET)  5-325 MG tablet Take 1 tablet by mouth every 6 (six) hours as needed., Starting Fri 06/25/2016, Until Sat 06/25/2017, Print        Note:  This document was prepared using Dragon voice recognition software and may include unintentional dictation errors.    Victorino Dike, FNP 06/29/16 2022    Merlyn Lot, MD 06/30/16 [phone]

## 2016-06-25 NOTE — ED Triage Notes (Signed)
Pt sliced the tip of her left thumb off with a paper cutter prior to arrival.  Pt could not find the tip of her thumb that she accidentally cut off.

## 2016-07-07 ENCOUNTER — Ambulatory Visit (INDEPENDENT_AMBULATORY_CARE_PROVIDER_SITE_OTHER): Payer: Medicare Other | Admitting: Family Medicine

## 2016-07-07 ENCOUNTER — Encounter: Payer: Self-pay | Admitting: Family Medicine

## 2016-07-07 VITALS — BP 165/71 | HR 62 | Temp 98.5°F | Wt 155.0 lb

## 2016-07-07 DIAGNOSIS — S61112D Laceration without foreign body of left thumb with damage to nail, subsequent encounter: Secondary | ICD-10-CM | POA: Diagnosis not present

## 2016-07-07 NOTE — Progress Notes (Signed)
   BP (!) 165/71   Pulse 62   Temp 98.5 F (36.9 C)   Wt 155 lb (70.3 kg)   LMP  (LMP Unknown)   SpO2 100%   BMI 26.44 kg/m    Subjective:    Patient ID: Julia Deleon, female    DOB: [birth year], 71 y.o.   MRN: [id number]  HPI: Julia Deleon is a 71 y.o. female  Chief Complaint  Patient presents with  . Finger Avulsion    recheck from ED, it has improved.   Patient presents for ER follow up after cutting fingertip of left thumb Friday. Taking advil prn with fairly good pain control, only took the percocet a couple of times over the weekend but does not like the sedation from it. Wound care has been gentle soap and water, and dressing with vasoline and keeping it covered with gauze. Denies fever, chills, streaking, thick discharge.   Relevant past medical, surgical, family and social history reviewed and updated as indicated. Interim medical history since our last visit reviewed. Allergies and medications reviewed and updated.  Review of Systems  Constitutional: Negative.   HENT: Negative.   Respiratory: Negative.   Cardiovascular: Negative.   Gastrointestinal: Negative.   Genitourinary: Negative.   Musculoskeletal: Negative.   Skin: Positive for wound.  Neurological: Negative.   Psychiatric/Behavioral: Negative.     Per HPI unless specifically indicated above     Objective:    BP (!) 165/71   Pulse 62   Temp 98.5 F (36.9 C)   Wt 155 lb (70.3 kg)   LMP  (LMP Unknown)   SpO2 100%   BMI 26.44 kg/m   Wt Readings from Last 3 Encounters:  07/07/16 155 lb (70.3 kg)  06/25/16 158 lb (71.7 kg)  04/22/16 158 lb (71.7 kg)    Physical Exam  Constitutional: She is oriented to person, place, and time. She appears well-developed and well-nourished. No distress.  HENT:  Head: Atraumatic.  Eyes: Conjunctivae are normal. Pupils are equal, round, and reactive to light.  Neck: Normal range of motion. Neck supple.  Cardiovascular: Normal rate and normal heart sounds.     Pulmonary/Chest: Effort normal and breath sounds normal. No respiratory distress.  Musculoskeletal: Normal range of motion.  Neurological: She is alert and oriented to person, place, and time.  Skin: Skin is warm and dry.  Left thumb wound healing well, no evidence of infection  Psychiatric: She has a normal mood and affect. Her behavior is normal.  Nursing note and vitals reviewed.     Assessment & Plan:   Problem List Items Addressed This Visit    None    Visit Diagnoses    Laceration of left thumb without foreign body with damage to nail, subsequent encounter    -  Primary   Healing well, wound care discussed. Wound was dressed and nonstick gauze pads given for home care. Follow up precautions given       Follow up plan: Return if symptoms worsen or fail to improve.

## 2016-07-07 NOTE — Patient Instructions (Signed)
Follow up as needed

## 2016-08-03 DIAGNOSIS — L820 Inflamed seborrheic keratosis: Secondary | ICD-10-CM | POA: Diagnosis not present

## 2016-08-03 DIAGNOSIS — D485 Neoplasm of uncertain behavior of skin: Secondary | ICD-10-CM | POA: Diagnosis not present

## 2016-08-03 DIAGNOSIS — L718 Other rosacea: Secondary | ICD-10-CM | POA: Diagnosis not present

## 2016-08-03 DIAGNOSIS — Z1283 Encounter for screening for malignant neoplasm of skin: Secondary | ICD-10-CM | POA: Diagnosis not present

## 2016-08-03 DIAGNOSIS — I8393 Asymptomatic varicose veins of bilateral lower extremities: Secondary | ICD-10-CM | POA: Diagnosis not present

## 2016-08-03 DIAGNOSIS — D1800 Hemangioma unspecified site: Secondary | ICD-10-CM | POA: Diagnosis not present

## 2016-08-03 DIAGNOSIS — L918 Other hypertrophic disorders of the skin: Secondary | ICD-10-CM | POA: Diagnosis not present

## 2016-08-03 DIAGNOSIS — L821 Other seborrheic keratosis: Secondary | ICD-10-CM | POA: Diagnosis not present

## 2016-09-09 NOTE — Progress Notes (Signed)
Patient ID: Julia Deleon, female   DOB: [birth year], 71 y.o.   MRN: [id number] ANNUAL PREVENTATIVE CARE GYN  ENCOUNTER NOTE  Subjective:       Julia Deleon is a 71 y.o. G2P2 female here for a routine annual gynecologic exam.  Current complaints: 1.  Rash on bottom;Resolving with treatment-nystatin/triamcinolone. 2. Hot flashes-intermittent and tolerable; not interested in other therapies at this time for management of vasomotor symptoms.    Gynecologic History No LMP recorded (lmp unknown). Patient is postmenopausal. Contraception: post menopausal status Last Pap: 2017 neg. Results were: normal Last mammogram: 09/2014. Results were: normal  Obstetric History OB History  No data available    Past Medical History:  Diagnosis Date  . COPD (chronic obstructive pulmonary disease) ([place])   . Fuch's endothelial dystrophy   . Hyperlipidemia     Past Surgical History:  Procedure Laterality Date  . APPENDECTOMY    . CHOLECYSTECTOMY    . EYE SURGERY    . FOOT SURGERY Right   . laser vein surgery    . NASAL SINUS SURGERY    . TONSILLECTOMY      Current Outpatient Prescriptions on File Prior to Visit  Medication Sig Dispense Refill  . amLODipine (NORVASC) 5 MG tablet Take 1 tablet (5 mg total) by mouth daily. 90 tablet 4  . Azelastine-Fluticasone (DYMISTA) 137-50 MCG/ACT SUSP Place 1 spray into the nose 2 (two) times daily.    . chlorzoxazone (PARAFON) 500 MG tablet Take 500 mg by mouth 4 (four) times daily as needed for muscle spasms.    Marland Kitchen estradiol (ESTRACE VAGINAL) 0.1 MG/GM vaginal cream Place 0.5 Applicatorfuls vaginally 3 (three) times a week. 42.5 g 12  . fexofenadine-pseudoephedrine (ALLEGRA-D) 60-120 MG per tablet Take 1 tablet by mouth daily.     Marland Kitchen levothyroxine (SYNTHROID, LEVOTHROID) 75 MCG tablet Take 1 tablet (75 mcg total) by mouth daily. 90 tablet 4  . losartan (COZAAR) 100 MG tablet Take 1 tablet (100 mg total) by mouth daily. 90 tablet 4  . nystatin-triamcinolone ointment  (MYCOLOG) Apply 1 application topically 2 (two) times daily. 30 g 0  . omega-3 acid ethyl esters (LOVAZA) 1 g capsule TAKE 4 CAPSULES BY MOUTH DAILY 120 capsule 12  . PROGESTERONE MICRONIZED PO Apply 4 % topically.    . triamcinolone (NASACORT ALLERGY 24HR) 55 MCG/ACT AERO nasal inhaler Place 2 sprays into the nose daily.     No current facility-administered medications on file prior to visit.     Allergies  Allergen Reactions  . Amoxicillin-Pot Clavulanate Nausea Only  . Levofloxacin Hives  . Simvastatin Itching    Social History   Social History  . Marital status: Married    Spouse name: N/A  . Number of children: N/A  . Years of education: N/A   Occupational History  . Not on file.   Social History Main Topics  . Smoking status: Never Smoker  . Smokeless tobacco: Never Used  . Alcohol use No  . Drug use: No  . Sexual activity: Yes    Birth control/ protection: Post-menopausal   Other Topics Concern  . Not on file   Social History Narrative  . No narrative on file    Family History  Problem Relation Age of Onset  . Cancer Mother     lung  . CAD Mother   . Stroke Father   . Cancer Sister     breast  . Varicose Veins Son   . Heart disease Maternal Grandmother   .  Cancer Paternal Grandmother     throat  . Emphysema Paternal Grandfather   . Fibromyalgia Sister     The following portions of the patient's history were reviewed and updated as appropriate: allergies, current medications, past family history, past medical history, past social history, past surgical history and problem list.  Review of Systems Review of Systems  Constitutional: Positive for fever. Negative for chills, diaphoresis, malaise/fatigue and weight loss.       Hot flashes, weight gain  HENT: Negative.   Eyes: Negative.   Respiratory: Negative.   Cardiovascular: Positive for leg swelling. Negative for chest pain, palpitations, orthopnea and claudication.  Gastrointestinal: Positive  for constipation. Negative for abdominal pain, blood in stool, diarrhea, heartburn, melena, nausea and vomiting.  Genitourinary: Negative.   Musculoskeletal: Negative.   Skin: Positive for rash. Negative for itching.  Neurological: Negative.  Negative for weakness.     Objective:   BP 132/72   Pulse 81   Ht 5\' 4"  (1.626 m)   Wt 154 lb 3.2 oz (69.9 kg)   LMP  (LMP Unknown)   BMI 26.47 kg/m  CONSTITUTIONAL: Well-developed, well-nourished female in no acute distress.  PSYCHIATRIC: Normal mood and affect. Normal behavior. Normal judgment and thought content. [place]: Alert and oriented to person, place, and time. Normal muscle tone coordination. No cranial nerve deficit noted. HENT:  Normocephalic, atraumatic, External right and left ear normal. Oropharynx is clear and moist EYES: Conjunctivae and EOM are normal.  No scleral icterus.  NECK: Normal range of motion, supple, no masses.  Normal thyroid.  SKIN: Skin is warm and dry. Not diaphoretic. No erythema. No pallor. Mild rash on buttocks, resolving CARDIOVASCULAR: Normal heart rate noted, regular rhythm, no murmur. RESPIRATORY: Clear to auscultation bilaterally. Effort and breath sounds normal, no problems with respiration noted. BREASTS: Symmetric in size. No masses, skin changes, nipple drainage, or lymphadenopathy. ABDOMEN: Soft, normal bowel sounds, no distention noted.  No tenderness, rebound or guarding.  BLADDER: Normal PELVIC:  External Genitalia: Normal  BUS: Urethral caruncle  Vagina: Moderate atrophic changes, non-friable polyp on left vaginal side wall   Cervix: Normal; no lesions; No cervical motion tenderness  Uterus: Normal; midplane, normal size and shape, mobile, nontender  Adnexa: Normal; nonpalpable and nontender  RV: Decreased sphincter tone, External Exam NormaI and No Rectal Masses  MUSCULOSKELETAL: Normal range of motion. No tenderness.  No cyanosis, clubbing, or edema.  2+ distal pulses. LYMPHATIC: No  Axillary, Supraclavicular, or Inguinal Adenopathy.    Assessment:   Annual gynecologic examination 71 y.o. Contraception: post menopausal status [id number] Menopausal state, symptomatic, on bioidentical hormone therapy; Does not desire other intervention at this time  Plan:  Pap: not needed Mammogram: Ordered Stool Guaiac Testing:  Ordered Labs: thru pcp Routine preventative health maintenance measures emphasized: Exercise/Diet/Weight control, Tobacco Warnings and Alcohol/Substance use risks Recommend bioidentical hormone therapy as follows (to be continued): Progesterone cream topically twice a day Estradiol vaginal cream twice a week The bioidentical hormone therapy may help with hot flashes, vaginal dryness, and possible minimizing UTIs associated with sex with the potential beneficial impact on the urothelium. Discuss non-hormonal therapies for vasomotor symptoms such as Aldomet or Clonidine patch. Return to [place], [place]  Brayton Mars, MD   Note: This dictation was prepared with Dragon dictation along with smaller phrase technology. Any transcriptional errors that result from this process are unintentional.

## 2016-09-15 ENCOUNTER — Encounter: Payer: Self-pay | Admitting: Obstetrics and Gynecology

## 2016-09-15 ENCOUNTER — Ambulatory Visit (INDEPENDENT_AMBULATORY_CARE_PROVIDER_SITE_OTHER): Payer: Medicare Other | Admitting: Obstetrics and Gynecology

## 2016-09-15 VITALS — BP 132/72 | HR 81 | Ht 64.0 in | Wt 154.2 lb

## 2016-09-15 DIAGNOSIS — N952 Postmenopausal atrophic vaginitis: Secondary | ICD-10-CM | POA: Diagnosis not present

## 2016-09-15 DIAGNOSIS — N842 Polyp of vagina: Secondary | ICD-10-CM

## 2016-09-15 DIAGNOSIS — Z1231 Encounter for screening mammogram for malignant neoplasm of breast: Secondary | ICD-10-CM | POA: Diagnosis not present

## 2016-09-15 DIAGNOSIS — Z01419 Encounter for gynecological examination (general) (routine) without abnormal findings: Secondary | ICD-10-CM

## 2016-09-15 DIAGNOSIS — Z1211 Encounter for screening for malignant neoplasm of colon: Secondary | ICD-10-CM

## 2016-09-15 DIAGNOSIS — Z780 Asymptomatic menopausal state: Secondary | ICD-10-CM | POA: Diagnosis not present

## 2016-09-15 DIAGNOSIS — Z1239 Encounter for other screening for malignant neoplasm of breast: Secondary | ICD-10-CM

## 2016-09-15 NOTE — Patient Instructions (Signed)
1. No Pap smear is done 2. Mammogram is ordered 3. Stool guaiac cards are given for colon cancer screening 4. Screening labs are to be done through primary care 5. Recommend consideration of clonidine patch for vasomotor symptom management (blood pressure medication) 6. Continue with bioidentical hormone therapy 7. Continue with calcium and vitamin D supplementation 8. Continue with healthy eating and exercise. 9. Return in 1 year for annual exam   Health Maintenance for Postmenopausal Women Menopause is a normal process in which your reproductive ability comes to an end. This process happens gradually over a span of months to years, usually between the ages of 86 and 53. Menopause is complete when you have missed 12 consecutive menstrual periods. It is important to talk with your health care provider about some of the most common conditions that affect postmenopausal women, such as heart disease, cancer, and bone loss (osteoporosis). Adopting a healthy lifestyle and getting preventive care can help to promote your health and wellness. Those actions can also lower your chances of developing some of these common conditions. What should I know about menopause? During menopause, you may experience a number of symptoms, such as:  Moderate-to-severe hot flashes.  Night sweats.  Decrease in sex drive.  Mood swings.  Headaches.  Tiredness.  Irritability.  Memory problems.  Insomnia. Choosing to treat or not to treat menopausal changes is an individual decision that you make with your health care provider. What should I know about hormone replacement therapy and supplements? Hormone therapy products are effective for treating symptoms that are associated with menopause, such as hot flashes and night sweats. Hormone replacement carries certain risks, especially as you become older. If you are thinking about using estrogen or estrogen with progestin treatments, discuss the benefits and  risks with your health care provider. What should I know about heart disease and stroke? Heart disease, heart attack, and stroke become more likely as you age. This may be due, in part, to the hormonal changes that your body experiences during menopause. These can affect how your body processes dietary fats, triglycerides, and cholesterol. Heart attack and stroke are both medical emergencies. There are many things that you can do to help prevent heart disease and stroke:  Have your blood pressure checked at least every 1-2 years. High blood pressure causes heart disease and increases the risk of stroke.  If you are 19-73 years old, ask your health care provider if you should take aspirin to prevent a heart attack or a stroke.  Do not use any tobacco products, including cigarettes, chewing tobacco, or electronic cigarettes. If you need help quitting, ask your health care provider.  It is important to eat a healthy diet and maintain a healthy weight.  Be sure to include plenty of vegetables, fruits, low-fat dairy products, and lean protein.  Avoid eating foods that are high in solid fats, added sugars, or salt (sodium).  Get regular exercise. This is one of the most important things that you can do for your health.  Try to exercise for at least 150 minutes each week. The type of exercise that you do should increase your heart rate and make you sweat. This is known as moderate-intensity exercise.  Try to do strengthening exercises at least twice each week. Do these in addition to the moderate-intensity exercise.  Know your numbers.Ask your health care provider to check your cholesterol and your blood glucose. Continue to have your blood tested as directed by your health care provider. What should  I know about cancer screening? There are several types of cancer. Take the following steps to reduce your risk and to catch any cancer development as early as possible. Breast Cancer  Practice  breast self-awareness.  This means understanding how your breasts normally appear and feel.  It also means doing regular breast self-exams. Let your health care provider know about any changes, no matter how small.  If you are 40 or older, have a clinician do a breast exam (clinical breast exam or CBE) every year. Depending on your age, family history, and medical history, it may be recommended that you also have a yearly breast X-ray (mammogram).  If you have a family history of breast cancer, talk with your health care provider about genetic screening.  If you are at high risk for breast cancer, talk with your health care provider about having an MRI and a mammogram every year.  Breast cancer (BRCA) gene test is recommended for women who have family members with BRCA-related cancers. Results of the assessment will determine the need for genetic counseling and BRCA1 and for BRCA2 testing. BRCA-related cancers include these types:  Breast. This occurs in males or females.  Ovarian.  Tubal. This may also be called fallopian tube cancer.  Cancer of the abdominal or pelvic lining (peritoneal cancer).  Prostate.  Pancreatic. Cervical, Uterine, and Ovarian Cancer  Your health care provider may recommend that you be screened regularly for cancer of the pelvic organs. These include your ovaries, uterus, and vagina. This screening involves a pelvic exam, which includes checking for microscopic changes to the surface of your cervix (Pap test).  For women ages 21-65, health care providers may recommend a pelvic exam and a Pap test every three years. For women ages 64-65, they may recommend the Pap test and pelvic exam, combined with testing for human papilloma virus (HPV), every five years. Some types of HPV increase your risk of cervical cancer. Testing for HPV may also be done on women of any age who have unclear Pap test results.  Other health care providers may not recommend any screening for  nonpregnant women who are considered low risk for pelvic cancer and have no symptoms. Ask your health care provider if a screening pelvic exam is right for you.  If you have had past treatment for cervical cancer or a condition that could lead to cancer, you need Pap tests and screening for cancer for at least 20 years after your treatment. If Pap tests have been discontinued for you, your risk factors (such as having a new sexual partner) need to be reassessed to determine if you should start having screenings again. Some women have medical problems that increase the chance of getting cervical cancer. In these cases, your health care provider may recommend that you have screening and Pap tests more often.  If you have a family history of uterine cancer or ovarian cancer, talk with your health care provider about genetic screening.  If you have vaginal bleeding after reaching menopause, tell your health care provider.  There are currently no reliable tests available to screen for ovarian cancer. Lung Cancer  Lung cancer screening is recommended for adults 21-6 years old who are at high risk for lung cancer because of a history of smoking. A yearly low-dose CT scan of the lungs is recommended if you:  Currently smoke.  Have a history of at least 30 pack-years of smoking and you currently smoke or have quit within the past 15 years.  A pack-year is smoking an average of one pack of cigarettes per day for one year. Yearly screening should:  Continue until it has been 15 years since you quit.  Stop if you develop a health problem that would prevent you from having lung cancer treatment. Colorectal Cancer  This type of cancer can be detected and can often be prevented.  Routine colorectal cancer screening usually begins at age 18 and continues through age 38.  If you have risk factors for colon cancer, your health care provider may recommend that you be screened at an earlier age.  If you have  a family history of colorectal cancer, talk with your health care provider about genetic screening.  Your health care provider may also recommend using home test kits to check for hidden blood in your stool.  A small camera at the end of a tube can be used to examine your colon directly (sigmoidoscopy or colonoscopy). This is done to check for the earliest forms of colorectal cancer.  Direct examination of the colon should be repeated every 5-10 years until age 31. However, if early forms of precancerous polyps or small growths are found or if you have a family history or genetic risk for colorectal cancer, you may need to be screened more often. Skin Cancer  Check your skin from head to toe regularly.  Monitor any moles. Be sure to tell your health care provider:  About any new moles or changes in moles, especially if there is a change in a mole's shape or color.  If you have a mole that is larger than the size of a pencil eraser.  If any of your family members has a history of skin cancer, especially at a young age, talk with your health care provider about genetic screening.  Always use sunscreen. Apply sunscreen liberally and repeatedly throughout the day.  Whenever you are outside, protect yourself by wearing long sleeves, pants, a wide-brimmed hat, and sunglasses. What should I know about osteoporosis? Osteoporosis is a condition in which bone destruction happens more quickly than new bone creation. After menopause, you may be at an increased risk for osteoporosis. To help prevent osteoporosis or the bone fractures that can happen because of osteoporosis, the following is recommended:  If you are 56-58 years old, get at least 1,000 mg of calcium and at least 600 mg of vitamin D per day.  If you are older than age 52 but younger than age 58, get at least 1,200 mg of calcium and at least 600 mg of vitamin D per day.  If you are older than age 85, get at least 1,200 mg of calcium and  at least 800 mg of vitamin D per day. Smoking and excessive alcohol intake increase the risk of osteoporosis. Eat foods that are rich in calcium and vitamin D, and do weight-bearing exercises several times each week as directed by your health care provider. What should I know about how menopause affects my mental health? Depression may occur at any age, but it is more common as you become older. Common symptoms of depression include:  Low or sad mood.  Changes in sleep patterns.  Changes in appetite or eating patterns.  Feeling an overall lack of motivation or enjoyment of activities that you previously enjoyed.  Frequent crying spells. Talk with your health care provider if you think that you are experiencing depression. What should I know about immunizations? It is important that you get and maintain your immunizations. These  include:  Tetanus, diphtheria, and pertussis (Tdap) booster vaccine.  Influenza every year before the flu season begins.  Pneumonia vaccine.  Shingles vaccine. Your health care provider may also recommend other immunizations. This information is not intended to replace advice given to you by your health care provider. Make sure you discuss any questions you have with your health care provider. Document Released: 06/18/2005 Document Revised: 11/14/2015 Document Reviewed: 01/28/2015 Elsevier Interactive Patient Education  2017 [employer].

## 2016-09-22 ENCOUNTER — Ambulatory Visit
Admission: RE | Admit: 2016-09-22 | Discharge: 2016-09-22 | Disposition: A | Discharge status: Home / Self Care | Payer: Medicare Other | Source: Ambulatory Visit | Attending: Obstetrics and Gynecology | Admitting: Obstetrics and Gynecology

## 2016-09-22 ENCOUNTER — Ambulatory Visit: Payer: Medicare Other | Admitting: Family Medicine

## 2016-09-22 DIAGNOSIS — Z1231 Encounter for screening mammogram for malignant neoplasm of breast: Secondary | ICD-10-CM | POA: Diagnosis not present

## 2016-09-22 DIAGNOSIS — Z1239 Encounter for other screening for malignant neoplasm of breast: Secondary | ICD-10-CM

## 2016-09-30 ENCOUNTER — Ambulatory Visit (INDEPENDENT_AMBULATORY_CARE_PROVIDER_SITE_OTHER): Payer: Medicare Other | Admitting: Family Medicine

## 2016-09-30 ENCOUNTER — Telehealth: Payer: Self-pay

## 2016-09-30 ENCOUNTER — Encounter: Payer: Self-pay | Admitting: Family Medicine

## 2016-09-30 VITALS — BP 145/75 | HR 63 | Wt 155.2 lb

## 2016-09-30 DIAGNOSIS — I10 Essential (primary) hypertension: Secondary | ICD-10-CM

## 2016-09-30 DIAGNOSIS — E785 Hyperlipidemia, unspecified: Secondary | ICD-10-CM

## 2016-09-30 LAB — LP+ALT+AST PICCOLO, WAIVED
ALT (SGPT) Piccolo, Waived: 24 U/L (ref 10–47)
AST (SGOT) Piccolo, Waived: 25 U/L (ref 11–38)
Chol/HDL Ratio Piccolo,Waive: 4.9 mg/dL
Cholesterol Piccolo, Waived: 211 mg/dL — ABNORMAL HIGH (ref <200)
HDL Chol Piccolo, Waived: 43 mg/dL — ABNORMAL LOW (ref >59)
LDL Chol Calc Piccolo Waived: 153 mg/dL — ABNORMAL HIGH (ref <100)
Triglycerides Piccolo,Waived: 76 mg/dL (ref <150)
VLDL Chol Calc Piccolo,Waive: 15 mg/dL (ref <30)

## 2016-09-30 MED ORDER — COLESEVELAM HCL 625 MG PO TABS: 1875.0000 mg | ORAL_TABLET | Freq: Two times a day (BID) | ORAL | 6 refills | Status: DC

## 2016-09-30 MED ORDER — CHLORZOXAZONE 500 MG PO TABS: 500.0000 mg | ORAL_TABLET | Freq: Four times a day (QID) | ORAL | 2 refills | Status: DC | PRN

## 2016-09-30 NOTE — Assessment & Plan Note (Signed)
Discussed with patient elevated cholesterol and worsening. Discuss use of statins patient very adamant about not using statin medications. Discussed alternatives and will use WelChol

## 2016-09-30 NOTE — Telephone Encounter (Signed)
P.A. Velta Addison for Chlorzoxazone 500 mg via [email or web address]

## 2016-09-30 NOTE — Progress Notes (Signed)
BP (!) 145/75   Pulse 63   Wt 155 lb 3.2 oz (70.4 kg)   LMP  (LMP Unknown)   SpO2 99%   BMI 26.64 kg/m    Subjective:    Patient ID: Julia Deleon, female    DOB: 10/16/1945, 71 y.o.   MRN: [id number]  HPI: Julia Deleon is a 71 y.o. female  Chief Complaint  Patient presents with  . Follow-up  . Hypertension  Follow-up hypertension blood pressure elevated here with continued quite coated hypertension. On review though patient not checking blood pressure at home and has stressful lifestyle concern blood pressure may be elevated at home with stress. Takes medication without side effects or problems. 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 Reviewed lifting process and pointing process with patient. Discussed use of Advil and Aleve which patient's already taking. Doing well with cholesterol medication no problems. Relevant past medical, surgical, family and social history reviewed and updated as indicated. Interim medical history since our last visit reviewed. Allergies and medications reviewed and updated.  Review of Systems  Constitutional: Negative.   Respiratory: Negative.   Cardiovascular: Negative.     Per HPI unless specifically indicated above     Objective:    BP (!) 145/75   Pulse 63   Wt 155 lb 3.2 oz (70.4 kg)   LMP  (LMP Unknown)   SpO2 99%   BMI 26.64 kg/m   Wt Readings from Last 3 Encounters:  09/30/16 155 lb 3.2 oz (70.4 kg)  09/15/16 154 lb 3.2 oz (69.9 kg)  07/07/16 155 lb (70.3 kg)    Physical Exam  Constitutional: She is oriented to person, place, and time. She appears well-developed and well-nourished.  HENT:  Head: Normocephalic and atraumatic.  Eyes: Conjunctivae and EOM are normal.  Neck: Normal range of motion.  Cardiovascular:  Normal rate, regular rhythm and normal heart sounds.   Pulmonary/Chest: Effort normal and breath sounds normal.  Musculoskeletal: Normal range of motion.  Neurological: She is alert and oriented to person, place, and time.  Skin: No erythema.  Psychiatric: She has a normal mood and affect. Her behavior is normal. Judgment and thought content normal.    Results for orders placed or performed in visit on 04/22/16  Microscopic Examination  Result Value Ref Range   WBC, UA 0-5 0 - 5 /hpf   RBC, UA None seen 0 - 2 /hpf   Epithelial Cells (non renal) 0-10 0 - 10 /hpf   Bacteria, UA Moderate (A) None seen/Few  UA/M w/rflx Culture, Routine  Result Value Ref Range   Specific Gravity, UA 1.010 1.005 - 1.030   pH, UA 7.0 5.0 - 7.5   Color, UA Yellow Yellow   Appearance Ur Clear Clear   Leukocytes, UA Trace (A) Negative   Protein, UA Negative Negative/Trace   Glucose, UA Negative Negative   Ketones, UA Negative Negative   RBC, UA Negative Negative   Bilirubin, UA Negative Negative   Urobilinogen, Ur 0.2 0.2 - 1.0 mg/dL   Nitrite, UA Negative Negative   Microscopic Examination See below:    Urinalysis Reflex Comment   Urine Culture, Routine  Result Value Ref Range   Urine Culture, Routine Final report (A)    Urine Culture result 1 Escherichia coli (A)  ANTIMICROBIAL SUSCEPTIBILITY Comment       Assessment & Plan:   Problem List Items Addressed This Visit      Cardiovascular and Mediastinum   Essential hypertension - Primary   Relevant Medications   colesevelam (WELCHOL) 625 MG tablet   Other Relevant Orders   LP+ALT+AST Piccolo, Waived   Basic metabolic panel     Other   Hyperlipidemia    Discussed with patient elevated cholesterol and worsening. Discuss use of statins patient very adamant about not using statin medications. Discussed alternatives and will use WelChol      Relevant Medications   colesevelam (WELCHOL) 625 MG tablet   Other Relevant Orders   LP+ALT+AST  Piccolo, Waived   Basic metabolic panel       Follow up plan: Return in about 6 months (around 04/02/2017) for Physical Exam.  Also recheck 1-2 months for lipid panel OV

## 2016-10-01 LAB — BASIC METABOLIC PANEL
BUN/Creatinine Ratio: 24 (ref 12–28)
BUN: 28 mg/dL — ABNORMAL HIGH (ref 8–27)
CO2: 24 mmol/L (ref 18–29)
Calcium: 9.3 mg/dL (ref 8.7–10.3)
Chloride: 102 mmol/L (ref 96–106)
Creatinine, Ser: 1.19 mg/dL — ABNORMAL HIGH (ref 0.57–1.00)
GFR calc Af Amer: 54 mL/min/{1.73_m2} — ABNORMAL LOW (ref >59)
GFR calc non Af Amer: 47 mL/min/{1.73_m2} — ABNORMAL LOW (ref >59)
Glucose: 88 mg/dL (ref 65–99)
Potassium: 4.6 mmol/L (ref 3.5–5.2)
Sodium: 140 mmol/L (ref 134–144)

## 2016-10-05 ENCOUNTER — Telehealth: Payer: Self-pay | Admitting: Family Medicine

## 2016-10-05 DIAGNOSIS — N183 Chronic kidney disease, stage 3 unspecified: Secondary | ICD-10-CM

## 2016-10-05 NOTE — Telephone Encounter (Signed)
Phone call Discussed with patient developing renal failure again. Patient is back on her supplements. Reviewed previously renal toxicity with her supplements stop the supplements and her renal toxicity stopped.  Patient really wants to take supplements discussed Will take only one she feels she's got to have and will recheck renal function 1 month with BMP.

## 2016-10-11 ENCOUNTER — Encounter: Payer: Self-pay | Admitting: Unknown Physician Specialty

## 2016-10-11 ENCOUNTER — Ambulatory Visit (INDEPENDENT_AMBULATORY_CARE_PROVIDER_SITE_OTHER): Payer: Medicare Other | Admitting: Unknown Physician Specialty

## 2016-10-11 VITALS — BP 135/77 | HR 67 | Temp 98.5°F | Wt 152.4 lb

## 2016-10-11 DIAGNOSIS — R109 Unspecified abdominal pain: Secondary | ICD-10-CM | POA: Diagnosis not present

## 2016-10-11 DIAGNOSIS — R110 Nausea: Secondary | ICD-10-CM

## 2016-10-11 MED ORDER — ONDANSETRON 4 MG PO TBDP: 4.0000 mg | ORAL_TABLET | Freq: Three times a day (TID) | ORAL | 0 refills | Status: DC | PRN

## 2016-10-11 NOTE — Progress Notes (Signed)
BP 135/77   Pulse 67   Temp 98.5 F (36.9 C)   Wt 152 lb 6.4 oz (69.1 kg)   LMP  (LMP Unknown)   SpO2 98%   BMI 26.16 kg/m    Subjective:    Patient ID: Julia Deleon, female    DOB: [birth year], 71 y.o.   MRN: [id number]  +   HPI: Julia Deleon is a 71 y.o. female  Chief Complaint  Patient presents with  . Nausea    pt states she started having stomach pain Friday night and has been feeling nauseous since then. States she was started on welchol recently and wonders if it could be from that   Stomach pain Stomach pain for 3 days with nausea the day after starting Welchol.  No vomiting, fever, or diarrhea.  She stopped the Welchol the following day and still has nausea but her stomach pain is better.    Past Surgical History:  Procedure Laterality Date  . APPENDECTOMY    . CHOLECYSTECTOMY    . EYE SURGERY    . FOOT SURGERY Right   . laser vein surgery    . NASAL SINUS SURGERY    . TONSILLECTOMY       Relevant past medical, surgical, family and social history reviewed and updated as indicated. Interim medical history since our last visit reviewed. Allergies and medications reviewed and updated.  Review of Systems  Per HPI unless specifically indicated above     Objective:    BP 135/77   Pulse 67   Temp 98.5 F (36.9 C)   Wt 152 lb 6.4 oz (69.1 kg)   LMP  (LMP Unknown)   SpO2 98%   BMI 26.16 kg/m   Wt Readings from Last 3 Encounters:  10/11/16 152 lb 6.4 oz (69.1 kg)  09/30/16 155 lb 3.2 oz (70.4 kg)  09/15/16 154 lb 3.2 oz (69.9 kg)    Physical Exam  Constitutional: She is oriented to person, place, and time. She appears well-developed and well-nourished. No distress.  HENT:  Head: Normocephalic and atraumatic.  Eyes: Conjunctivae and lids are normal. Right eye exhibits no discharge. Left eye exhibits no discharge. No scleral icterus.  Neck: Normal range of motion. Neck supple. No JVD present. Carotid bruit is not present.  Cardiovascular: Normal rate,  regular rhythm and normal heart sounds.   Pulmonary/Chest: Effort normal and breath sounds normal.  Abdominal: Soft. Normal appearance. She exhibits no distension and no mass. There is no splenomegaly or hepatomegaly. There is no tenderness. There is no rebound and no guarding.  Musculoskeletal: Normal range of motion.  Neurological: She is alert and oriented to person, place, and time.  Skin: Skin is warm, dry and intact. No rash noted. No pallor.  Psychiatric: She has a normal mood and affect. Her behavior is normal. Judgment and thought content normal.    Results for orders placed or performed in visit on 09/30/16  LP+ALT+AST Piccolo, [employer]  Result Value Ref Range   ALT (SGPT) Piccolo, Waived 24 10 - 47 U/L   AST (SGOT) Piccolo, Waived 25 11 - 38 U/L   Cholesterol Piccolo, Waived 211 (H) <200 mg/dL   HDL Chol Piccolo, Waived 43 (L) >59 mg/dL   Triglycerides Piccolo,Waived 76 <150 mg/dL   Chol/HDL Ratio Piccolo,Waive 4.9 mg/dL   LDL Chol Calc Piccolo Waived 153 (H) <100 mg/dL   VLDL Chol Calc Piccolo,Waive 15 <30 mg/dL  Basic metabolic panel  Result Value Ref Range  Glucose 88 65 - 99 mg/dL   BUN 28 (H) 8 - 27 mg/dL   Creatinine, Ser 1.19 (H) 0.57 - 1.00 mg/dL   GFR calc non Af Amer 47 (L) >59 mL/min/1.73   GFR calc Af Amer 54 (L) >59 mL/min/1.73   BUN/Creatinine Ratio 24 12 - 28   Sodium 140 134 - 144 mmol/L   Potassium 4.6 3.5 - 5.2 mmol/L   Chloride 102 96 - 106 mmol/L   CO2 24 18 - 29 mmol/L   Calcium 9.3 8.7 - 10.3 mg/dL      Assessment & Plan:   Problem List Items Addressed This Visit    None    Visit Diagnoses    Stomach pain    -  Primary   Suspect a side effect to welchol.  She is better.  Graduated diet and Zofran for nausea   Relevant Orders   UA/M w/rflx Culture, Routine   Nausea       Start zofran 4mg  q 6 hours prn       Follow up plan: Return if symptoms worsen or fail to improve.

## 2016-10-11 NOTE — Addendum Note (Signed)
Addended by: Moss Mc on: 10/11/2016 10:18 AM   Modules accepted: Orders

## 2016-10-15 ENCOUNTER — Other Ambulatory Visit: Payer: Self-pay | Admitting: Family Medicine

## 2016-11-04 ENCOUNTER — Other Ambulatory Visit: Payer: Medicare Other

## 2016-11-04 DIAGNOSIS — R399 Unspecified symptoms and signs involving the genitourinary system: Secondary | ICD-10-CM | POA: Diagnosis not present

## 2016-11-04 DIAGNOSIS — N19 Unspecified kidney failure: Secondary | ICD-10-CM

## 2016-11-04 LAB — URINALYSIS, ROUTINE W REFLEX MICROSCOPIC
Bilirubin, UA: NEGATIVE
Glucose, UA: NEGATIVE
Ketones, UA: NEGATIVE
Leukocytes, UA: NEGATIVE
Nitrite, UA: NEGATIVE
Protein, UA: NEGATIVE
RBC, UA: NEGATIVE
Specific Gravity, UA: 1.015 (ref 1.005–1.030)
Urobilinogen, Ur: 0.2 mg/dL (ref 0.2–1.0)
pH, UA: 7 (ref 5.0–7.5)

## 2016-11-05 LAB — BASIC METABOLIC PANEL
BUN/Creatinine Ratio: 17 (ref 12–28)
BUN: 20 mg/dL (ref 8–27)
CO2: 23 mmol/L (ref 20–29)
Calcium: 9.4 mg/dL (ref 8.7–10.3)
Chloride: 100 mmol/L (ref 96–106)
Creatinine, Ser: 1.15 mg/dL — ABNORMAL HIGH (ref 0.57–1.00)
GFR calc Af Amer: 56 mL/min/{1.73_m2} — ABNORMAL LOW (ref >59)
GFR calc non Af Amer: 49 mL/min/{1.73_m2} — ABNORMAL LOW (ref >59)
Glucose: 95 mg/dL (ref 65–99)
Potassium: 4.3 mmol/L (ref 3.5–5.2)
Sodium: 139 mmol/L (ref 134–144)

## 2016-11-08 ENCOUNTER — Encounter: Payer: Self-pay | Admitting: Family Medicine

## 2016-11-17 ENCOUNTER — Telehealth: Payer: Self-pay | Admitting: Family Medicine

## 2016-11-17 NOTE — Telephone Encounter (Signed)
Patient would like for Dr Jeananne Rama to call her back to go over the labs she had done. She said she received them but really needed to speak to him to discuss.  She is hoping he can call her this afternoon before he leaves.  Thanks  [phone]

## 2016-11-24 ENCOUNTER — Telehealth: Payer: Self-pay | Admitting: Family Medicine

## 2016-11-24 NOTE — Telephone Encounter (Signed)
Pt was rescheduled for the 2nd.

## 2016-11-24 NOTE — Telephone Encounter (Signed)
Patient canceled appointment for 11/29/2016. Patient would like to know if she needs to come in still for an appointment soon and more information on why she may need to.  Please Advise.  Thank you

## 2016-11-24 NOTE — Telephone Encounter (Signed)
Also recheck 1-2 months for lipid panel OV

## 2016-11-29 ENCOUNTER — Ambulatory Visit: Payer: Medicare Other | Admitting: Family Medicine

## 2016-12-02 DIAGNOSIS — R600 Localized edema: Secondary | ICD-10-CM | POA: Diagnosis not present

## 2016-12-02 DIAGNOSIS — I10 Essential (primary) hypertension: Secondary | ICD-10-CM | POA: Diagnosis not present

## 2016-12-02 DIAGNOSIS — R601 Generalized edema: Secondary | ICD-10-CM | POA: Diagnosis not present

## 2016-12-02 DIAGNOSIS — N183 Chronic kidney disease, stage 3 (moderate): Secondary | ICD-10-CM | POA: Diagnosis not present

## 2016-12-02 DIAGNOSIS — N182 Chronic kidney disease, stage 2 (mild): Secondary | ICD-10-CM | POA: Diagnosis not present

## 2016-12-09 ENCOUNTER — Encounter: Payer: Self-pay | Admitting: Family Medicine

## 2016-12-09 ENCOUNTER — Telehealth: Payer: Self-pay

## 2016-12-09 ENCOUNTER — Ambulatory Visit (INDEPENDENT_AMBULATORY_CARE_PROVIDER_SITE_OTHER): Payer: Medicare Other | Admitting: Family Medicine

## 2016-12-09 VITALS — BP 128/70 | HR 62 | Wt 154.0 lb

## 2016-12-09 DIAGNOSIS — M1990 Unspecified osteoarthritis, unspecified site: Secondary | ICD-10-CM

## 2016-12-09 DIAGNOSIS — E785 Hyperlipidemia, unspecified: Secondary | ICD-10-CM

## 2016-12-09 DIAGNOSIS — Z23 Encounter for immunization: Secondary | ICD-10-CM | POA: Diagnosis not present

## 2016-12-09 DIAGNOSIS — I10 Essential (primary) hypertension: Secondary | ICD-10-CM | POA: Diagnosis not present

## 2016-12-09 MED ORDER — CICLOPIROX 8 % EX SOLN: Freq: Every day | CUTANEOUS | 12 refills | Status: DC

## 2016-12-09 NOTE — Addendum Note (Signed)
Addended by: Golden Pop A on: 12/09/2016 12:00 PM   Modules accepted: Orders

## 2016-12-09 NOTE — Assessment & Plan Note (Signed)
The current medical regimen is effective;  continue present plan and medications.  

## 2016-12-09 NOTE — Assessment & Plan Note (Signed)
Patient with generalized arthralgias been treated with polypharmacy 20+ supplements. With these being toxic to her kidneys these of been stopped and arthralgias have recurred. Will refer to rheumatology to further evaluate arthralgias to look for other potential causes. Our working diagnosis for arthralgias in the past is been stress.

## 2016-12-09 NOTE — Telephone Encounter (Signed)
Pt states that she forgot to get an RX for a topical cream for foot fungus. Please advise.

## 2016-12-09 NOTE — Assessment & Plan Note (Signed)
Discuss hypercholesterol and multiple drug intolerance will treat with diet exercise nutrition for now not use medications.

## 2016-12-09 NOTE — Progress Notes (Signed)
BP 128/70   Pulse 62   Wt 154 lb (69.9 kg)   LMP  (LMP Unknown)   SpO2 98%   BMI 26.43 kg/m    Subjective:    Patient ID: Julia Deleon, female    DOB: Apr 22, 1946, 71 y.o.   MRN: [id number]  HPI: Julia Deleon is a 71 y.o. female  Chief Complaint  Patient presents with  . Follow-up  . Hyperlipidemia  . Hypertension   Patient unable to take WelChol due to nausea and generalized malaise. Has stopped that and doing better off medications and on. Patient's chronic kidney disease has gotten worse again with back on supplements just saw nephrology last week patient taking 20+ supplements for inflammation and generalized achiness and joint aches. These aches and achiness are  back now that she is off her supplements. Blood pressure doing well. Review of medications taking chlorzoxazone occasionally.   Relevant past medical, surgical, family and social history reviewed and updated as indicated. Interim medical history since our last visit reviewed. Allergies and medications reviewed and updated.  Review of Systems  Constitutional: Negative.   Respiratory: Negative.   Cardiovascular: Negative.     Per HPI unless specifically indicated above     Objective:    BP 128/70   Pulse 62   Wt 154 lb (69.9 kg)   LMP  (LMP Unknown)   SpO2 98%   BMI 26.43 kg/m   Wt Readings from Last 3 Encounters:  12/09/16 154 lb (69.9 kg)  10/11/16 152 lb 6.4 oz (69.1 kg)  09/30/16 155 lb 3.2 oz (70.4 kg)    Physical Exam  Constitutional: She is oriented to person, place, and time. She appears well-developed and well-nourished.  HENT:  Head: Normocephalic and atraumatic.  Eyes: Conjunctivae and EOM are normal.  Neck: Normal range of motion.  Cardiovascular: Normal rate, regular rhythm and normal heart sounds.   Pulmonary/Chest: Effort normal and breath sounds normal.  Musculoskeletal: Normal range of motion.  Neurological: She is alert and oriented to person, place, and time.  Skin: No  erythema.  Psychiatric: She has a normal mood and affect. Her behavior is normal. Judgment and thought content normal.    Results for orders placed or performed in visit on 00/86/76  Basic metabolic panel  Result Value Ref Range   Glucose 95 65 - 99 mg/dL   BUN 20 8 - 27 mg/dL   Creatinine, Ser 1.15 (H) 0.57 - 1.00 mg/dL   GFR calc non Af Amer 49 (L) >59 mL/min/1.73   GFR calc Af Amer 56 (L) >59 mL/min/1.73   BUN/Creatinine Ratio 17 12 - 28   Sodium 139 134 - 144 mmol/L   Potassium 4.3 3.5 - 5.2 mmol/L   Chloride 100 96 - 106 mmol/L   CO2 23 20 - 29 mmol/L   Calcium 9.4 8.7 - 10.3 mg/dL  Urinalysis, Routine w reflex microscopic  Result Value Ref Range   Specific Gravity, UA 1.015 1.005 - 1.030   pH, UA 7.0 5.0 - 7.5   Color, UA Yellow Yellow   Appearance Ur Clear Clear   Leukocytes, UA Negative Negative   Protein, UA Negative Negative/Trace   Glucose, UA Negative Negative   Ketones, UA Negative Negative   RBC, UA Negative Negative   Bilirubin, UA Negative Negative   Urobilinogen, Ur 0.2 0.2 - 1.0 mg/dL   Nitrite, UA Negative Negative      Assessment & Plan:   Problem List Items Addressed This Visit  Cardiovascular and Mediastinum   Essential hypertension - Primary    The current medical regimen is effective;  continue present plan and medications.       Relevant Orders   LP+ALT+AST Piccolo, Waived     Musculoskeletal and Integument   Arthritis    Patient with generalized arthralgias been treated with polypharmacy 20+ supplements. With these being toxic to her kidneys these of been stopped and arthralgias have recurred. Will refer to rheumatology to further evaluate arthralgias to look for other potential causes. Our working diagnosis for arthralgias in the past is been stress.      Relevant Orders   Ambulatory referral to Rheumatology     Other   Hyperlipidemia    Discuss hypercholesterol and multiple drug intolerance will treat with diet exercise  nutrition for now not use medications.      Relevant Orders   LP+ALT+AST Piccolo, [place]    Other Visit Diagnoses    Need for pneumococcal vaccination       Relevant Orders   Pneumococcal polysaccharide vaccine 23-valent greater than or equal to 2yo subcutaneous/IM (Completed)       Follow up plan: Return in about 3 months (around 03/11/2017) for Physical Exam.

## 2016-12-13 LAB — LP+ALT+AST PICCOLO, WAIVED
ALT (SGPT) Piccolo, Waived: 18 U/L (ref 10–47)
AST (SGOT) Piccolo, Waived: 28 U/L (ref 11–38)
Chol/HDL Ratio Piccolo,Waive: 4.8 mg/dL
Cholesterol Piccolo, Waived: 206 mg/dL — ABNORMAL HIGH (ref <200)
HDL Chol Piccolo, Waived: 43 mg/dL — ABNORMAL LOW (ref >59)
LDL Chol Calc Piccolo Waived: 146 mg/dL — ABNORMAL HIGH (ref <100)
Triglycerides Piccolo,Waived: 84 mg/dL (ref <150)
VLDL Chol Calc Piccolo,Waive: 17 mg/dL (ref <30)

## 2016-12-30 DIAGNOSIS — G8929 Other chronic pain: Secondary | ICD-10-CM | POA: Diagnosis not present

## 2016-12-30 DIAGNOSIS — M79642 Pain in left hand: Secondary | ICD-10-CM | POA: Diagnosis not present

## 2016-12-30 DIAGNOSIS — M150 Primary generalized (osteo)arthritis: Secondary | ICD-10-CM | POA: Diagnosis not present

## 2016-12-30 DIAGNOSIS — M545 Low back pain: Secondary | ICD-10-CM | POA: Diagnosis not present

## 2016-12-30 DIAGNOSIS — N179 Acute kidney failure, unspecified: Secondary | ICD-10-CM | POA: Diagnosis not present

## 2016-12-30 DIAGNOSIS — N183 Chronic kidney disease, stage 3 (moderate): Secondary | ICD-10-CM | POA: Diagnosis not present

## 2016-12-30 DIAGNOSIS — M79672 Pain in left foot: Secondary | ICD-10-CM | POA: Diagnosis not present

## 2016-12-30 DIAGNOSIS — M79671 Pain in right foot: Secondary | ICD-10-CM | POA: Diagnosis not present

## 2016-12-30 DIAGNOSIS — N049 Nephrotic syndrome with unspecified morphologic changes: Secondary | ICD-10-CM | POA: Diagnosis not present

## 2016-12-30 DIAGNOSIS — M79641 Pain in right hand: Secondary | ICD-10-CM | POA: Diagnosis not present

## 2017-01-04 DIAGNOSIS — I83893 Varicose veins of bilateral lower extremities with other complications: Secondary | ICD-10-CM | POA: Insufficient documentation

## 2017-02-02 ENCOUNTER — Telehealth: Payer: Self-pay | Admitting: Obstetrics and Gynecology

## 2017-02-02 DIAGNOSIS — N183 Chronic kidney disease, stage 3 (moderate): Secondary | ICD-10-CM | POA: Diagnosis not present

## 2017-02-02 DIAGNOSIS — I10 Essential (primary) hypertension: Secondary | ICD-10-CM | POA: Diagnosis not present

## 2017-02-02 DIAGNOSIS — R600 Localized edema: Secondary | ICD-10-CM | POA: Diagnosis not present

## 2017-02-02 DIAGNOSIS — Z23 Encounter for immunization: Secondary | ICD-10-CM | POA: Diagnosis not present

## 2017-02-02 DIAGNOSIS — E785 Hyperlipidemia, unspecified: Secondary | ICD-10-CM | POA: Diagnosis not present

## 2017-02-02 DIAGNOSIS — E7800 Pure hypercholesterolemia, unspecified: Secondary | ICD-10-CM | POA: Diagnosis not present

## 2017-02-02 NOTE — Telephone Encounter (Signed)
Patient needs refill on the vaginal cream - she uses [facility] for this since it is compounded - she doen't remember the name of the cream

## 2017-02-02 NOTE — Telephone Encounter (Signed)
Med phoned into [facility](Teresa). Pt aware.

## 2017-03-17 DIAGNOSIS — I83893 Varicose veins of bilateral lower extremities with other complications: Secondary | ICD-10-CM | POA: Diagnosis not present

## 2017-03-28 ENCOUNTER — Ambulatory Visit (INDEPENDENT_AMBULATORY_CARE_PROVIDER_SITE_OTHER): Payer: Medicare Other | Admitting: Family Medicine

## 2017-03-28 ENCOUNTER — Encounter: Payer: Self-pay | Admitting: Family Medicine

## 2017-03-28 ENCOUNTER — Ambulatory Visit (INDEPENDENT_AMBULATORY_CARE_PROVIDER_SITE_OTHER): Payer: Medicare Other

## 2017-03-28 VITALS — BP 132/68 | HR 74 | Ht 64.57 in | Wt 155.0 lb

## 2017-03-28 VITALS — BP 132/68 | HR 74 | Temp 97.5°F | Ht 64.0 in | Wt 155.0 lb

## 2017-03-28 DIAGNOSIS — J3089 Other allergic rhinitis: Secondary | ICD-10-CM

## 2017-03-28 DIAGNOSIS — Z0000 Encounter for general adult medical examination without abnormal findings: Secondary | ICD-10-CM

## 2017-03-28 DIAGNOSIS — Z7189 Other specified counseling: Secondary | ICD-10-CM | POA: Diagnosis not present

## 2017-03-28 DIAGNOSIS — I10 Essential (primary) hypertension: Secondary | ICD-10-CM

## 2017-03-28 DIAGNOSIS — M545 Low back pain: Secondary | ICD-10-CM | POA: Diagnosis not present

## 2017-03-28 DIAGNOSIS — N183 Chronic kidney disease, stage 3 unspecified: Secondary | ICD-10-CM

## 2017-03-28 DIAGNOSIS — E038 Other specified hypothyroidism: Secondary | ICD-10-CM

## 2017-03-28 DIAGNOSIS — G8929 Other chronic pain: Secondary | ICD-10-CM

## 2017-03-28 DIAGNOSIS — E785 Hyperlipidemia, unspecified: Secondary | ICD-10-CM | POA: Diagnosis not present

## 2017-03-28 MED ORDER — AMLODIPINE BESYLATE 5 MG PO TABS: 5.0000 mg | ORAL_TABLET | Freq: Every day | ORAL | 4 refills | Status: DC

## 2017-03-28 MED ORDER — TRIAMCINOLONE ACETONIDE 55 MCG/ACT NA AERO: 2.0000 | INHALATION_SPRAY | Freq: Every day | NASAL | 12 refills | Status: DC

## 2017-03-28 MED ORDER — CHLORZOXAZONE 500 MG PO TABS: 500.0000 mg | ORAL_TABLET | Freq: Four times a day (QID) | ORAL | 2 refills | Status: DC | PRN

## 2017-03-28 MED ORDER — CICLOPIROX 8 % EX SOLN: Freq: Every day | CUTANEOUS | 12 refills | Status: DC

## 2017-03-28 MED ORDER — AZELASTINE-FLUTICASONE 137-50 MCG/ACT NA SUSP: 1.0000 | Freq: Two times a day (BID) | NASAL | 12 refills | Status: DC

## 2017-03-28 MED ORDER — OMEGA-3-ACID ETHYL ESTERS 1 G PO CAPS: 4.0000 | ORAL_CAPSULE | Freq: Every day | ORAL | 12 refills | Status: DC

## 2017-03-28 MED ORDER — LOSARTAN POTASSIUM 100 MG PO TABS: 100.0000 mg | ORAL_TABLET | Freq: Every day | ORAL | 4 refills | Status: DC

## 2017-03-28 MED ORDER — LEVOTHYROXINE SODIUM 75 MCG PO TABS: 75.0000 ug | ORAL_TABLET | Freq: Every day | ORAL | 4 refills | Status: DC

## 2017-03-28 NOTE — Patient Instructions (Signed)
[name] , Thank you for taking time to come for your Medicare Wellness Visit. I appreciate your ongoing commitment to your health goals. Please review the following plan we discussed and let me know if I can assist you in the future.   Screening recommendations/referrals: Colonoscopy: completed 11/10/2011 Mammogram: completed 09/22/2016 Bone Density: completed 03/25/2014 Recommended yearly ophthalmology/optometry visit for glaucoma screening and checkup Recommended yearly dental visit for hygiene and checkup  Vaccinations: Influenza vaccine: completed 02/06/2017 Pneumococcal vaccine: up to date Tdap vaccine: up to date Shingles vaccine: up to date  Advanced directives: Advance directive discussed with you today. I have provided a copy for you to complete at home and have notarized. Once this is complete please bring a copy in to our office so we can scan it into your chart.  Conditions/risks identified: recommend drinking at least 5-6 glasses of water a day   Next appointment: Follow up in one year for your annual wellness exam.     Preventive Care 65 Years and Older, Female Preventive care refers to lifestyle choices and visits with your health care provider that can promote health and wellness. What does preventive care include?  A yearly physical exam. This is also called an annual well check.  Dental exams once or twice a year.  Routine eye exams. Ask your health care provider how often you should have your eyes checked.  Personal lifestyle choices, including:  Daily care of your teeth and gums.  Regular physical activity.  Eating a healthy diet.  Avoiding tobacco and drug use.  Limiting alcohol use.  Practicing safe sex.  Taking low-dose aspirin every day.  Taking vitamin and mineral supplements as recommended by your health care provider. What happens during an annual well check? The services and screenings done by your health care provider during your annual  well check will depend on your age, overall health, lifestyle risk factors, and family history of disease. Counseling  Your health care provider may ask you questions about your:  Alcohol use.  Tobacco use.  Drug use.  Emotional well-being.  Home and relationship well-being.  Sexual activity.  Eating habits.  History of falls.  Memory and ability to understand (cognition).  Work and work Statistician.  Reproductive health. Screening  You may have the following tests or measurements:  Height, weight, and BMI.  Blood pressure.  Lipid and cholesterol levels. These may be checked every 5 years, or more frequently if you are over 90 years old.  Skin check.  Lung cancer screening. You may have this screening every year starting at age 83 if you have a 30-pack-year history of smoking and currently smoke or have quit within the past 15 years.  Fecal occult blood test (FOBT) of the stool. You may have this test every year starting at age 14.  Flexible sigmoidoscopy or colonoscopy. You may have a sigmoidoscopy every 5 years or a colonoscopy every 10 years starting at age 66.  Hepatitis C blood test.  Hepatitis B blood test.  Sexually transmitted disease (STD) testing.  Diabetes screening. This is done by checking your blood sugar (glucose) after you have not eaten for a while (fasting). You may have this done every 1-3 years.  Bone density scan. This is done to screen for osteoporosis. You may have this done starting at age 43.  Mammogram. This may be done every 1-2 years. Talk to your health care provider about how often you should have regular mammograms. Talk with your health care provider about  your test results, treatment options, and if necessary, the need for more tests. Vaccines  Your health care provider may recommend certain vaccines, such as:  Influenza vaccine. This is recommended every year.  Tetanus, diphtheria, and acellular pertussis (Tdap, Td) vaccine.  You may need a Td booster every 10 years.  Zoster vaccine. You may need this after age 66.  Pneumococcal 13-valent conjugate (PCV13) vaccine. One dose is recommended after age 52.  Pneumococcal polysaccharide (PPSV23) vaccine. One dose is recommended after age 85. Talk to your health care provider about which screenings and vaccines you need and how often you need them. This information is not intended to replace advice given to you by your health care provider. Make sure you discuss any questions you have with your health care provider. Document Released: 05/23/2015 Document Revised: 01/14/2016 Document Reviewed: 02/25/2015 Elsevier Interactive Patient Education  2017 [employer] Prevention in the Home Falls can cause injuries. They can happen to people of all ages. There are many things you can do to make your home safe and to help prevent falls. What can I do on the outside of my home?  Regularly fix the edges of walkways and driveways and fix any cracks.  Remove anything that might make you trip as you walk through a door, such as a raised step or threshold.  Trim any bushes or trees on the path to your home.  Use bright outdoor lighting.  Clear any walking paths of anything that might make someone trip, such as rocks or tools.  Regularly check to see if handrails are loose or broken. Make sure that both sides of any steps have handrails.  Any raised decks and porches should have guardrails on the edges.  Have any leaves, snow, or ice cleared regularly.  Use sand or salt on walking paths during winter.  Clean up any spills in your garage right away. This includes oil or grease spills. What can I do in the bathroom?  Use night lights.  Install grab bars by the toilet and in the tub and shower. Do not use towel bars as grab bars.  Use non-skid mats or decals in the tub or shower.  If you need to sit down in the shower, use a plastic, non-slip stool.  Keep the  floor dry. Clean up any water that spills on the floor as soon as it happens.  Remove soap buildup in the tub or shower regularly.  Attach bath mats securely with double-sided non-slip rug tape.  Do not have throw rugs and other things on the floor that can make you trip. What can I do in the bedroom?  Use night lights.  Make sure that you have a light by your bed that is easy to reach.  Do not use any sheets or blankets that are too big for your bed. They should not hang down onto the floor.  Have a firm chair that has side arms. You can use this for support while you get dressed.  Do not have throw rugs and other things on the floor that can make you trip. What can I do in the kitchen?  Clean up any spills right away.  Avoid walking on wet floors.  Keep items that you use a lot in easy-to-reach places.  If you need to reach something above you, use a strong step stool that has a grab bar.  Keep electrical cords out of the way.  Do not use floor polish or wax  that makes floors slippery. If you must use wax, use non-skid floor wax.  Do not have throw rugs and other things on the floor that can make you trip. What can I do with my stairs?  Do not leave any items on the stairs.  Make sure that there are handrails on both sides of the stairs and use them. Fix handrails that are broken or loose. Make sure that handrails are as long as the stairways.  Check any carpeting to make sure that it is firmly attached to the stairs. Fix any carpet that is loose or worn.  Avoid having throw rugs at the top or bottom of the stairs. If you do have throw rugs, attach them to the floor with carpet tape.  Make sure that you have a light switch at the top of the stairs and the bottom of the stairs. If you do not have them, ask someone to add them for you. What else can I do to help prevent falls?  Wear shoes that:  Do not have high heels.  Have rubber bottoms.  Are comfortable and fit  you well.  Are closed at the toe. Do not wear sandals.  If you use a stepladder:  Make sure that it is fully opened. Do not climb a closed stepladder.  Make sure that both sides of the stepladder are locked into place.  Ask someone to hold it for you, if possible.  Clearly mark and make sure that you can see:  Any grab bars or handrails.  First and last steps.  Where the edge of each step is.  Use tools that help you move around (mobility aids) if they are needed. These include:  Canes.  Walkers.  Scooters.  Crutches.  Turn on the lights when you go into a dark area. Replace any light bulbs as soon as they burn out.  Set up your furniture so you have a clear path. Avoid moving your furniture around.  If any of your floors are uneven, fix them.  If there are any pets around you, be aware of where they are.  Review your medicines with your doctor. Some medicines can make you feel dizzy. This can increase your chance of falling. Ask your doctor what other things that you can do to help prevent falls. This information is not intended to replace advice given to you by your health care provider. Make sure you discuss any questions you have with your health care provider. Document Released: 02/20/2009 Document Revised: 10/02/2015 Document Reviewed: 05/31/2014 Elsevier Interactive Patient Education  2017 [employer].

## 2017-03-28 NOTE — Assessment & Plan Note (Signed)
The current medical regimen is effective;  continue present plan and medications.  

## 2017-03-28 NOTE — Progress Notes (Addendum)
BP 132/68   Pulse 74   Ht 5' 4.57" (1.64 m)   Wt 155 lb (70.3 kg)   LMP  (LMP Unknown)   SpO2 97%   BMI 26.14 kg/m    Subjective:    Patient ID: Julia Deleon, female    DOB: [birth year], 71 y.o.   MRN: [id number]  HPI: Julia Deleon is a 71 y.o. female  Chief Complaint  Patient presents with  . Annual Exam  Patient all in all doing well still a lot of stress close in up estate issues down [place] in [place]. Maybe will all be wrapped up this winter. Patient has been traveling to [facility] on a regular basis for 30+ years. Other medical  Problems also stable allergies seem to be stable hypertension hypercholesterolalso stable. Reviewed patient with no real cardiovascular signs or symptoms but is concerned with her ongoing marked fatigue. Interested in coronary calcium scoring which we will order.  Relevant past medical, surgical, family and social history reviewed and updated as indicated. Interim medical history since our last visit reviewed. Allergies and medications reviewed and updated.  Review of Systems  Constitutional: Negative.   HENT: Negative.   Eyes: Negative.   Respiratory: Negative.   Cardiovascular: Negative.   Gastrointestinal: Negative.   Endocrine: Negative.   Genitourinary: Negative.   Musculoskeletal: Negative.   Skin: Negative.   Allergic/Immunologic: Negative.   Neurological: Negative.   Hematological: Negative.   Psychiatric/Behavioral: Negative.     Per HPI unless specifically indicated above     Objective:    BP 132/68   Pulse 74   Ht 5' 4.57" (1.64 m)   Wt 155 lb (70.3 kg)   LMP  (LMP Unknown)   SpO2 97%   BMI 26.14 kg/m   Wt Readings from Last 3 Encounters:  03/28/17 155 lb (70.3 kg)  03/28/17 155 lb (70.3 kg)  12/09/16 154 lb (69.9 kg)    Physical Exam  Constitutional: She is oriented to person, place, and time. She appears well-developed and well-nourished.  HENT:  Head: Normocephalic and atraumatic.  Right Ear:  External ear normal.  Left Ear: External ear normal.  Nose: Nose normal.  Mouth/Throat: Oropharynx is clear and moist.  Eyes: Conjunctivae and EOM are normal. Pupils are equal, round, and reactive to light.  Neck: Normal range of motion. Neck supple. Carotid bruit is not present.  Cardiovascular: Normal rate, regular rhythm and normal heart sounds.  No murmur heard. Pulmonary/Chest: Effort normal and breath sounds normal.  Abdominal: Soft. Bowel sounds are normal. There is no hepatosplenomegaly.  Musculoskeletal: Normal range of motion.  Neurological: She is alert and oriented to person, place, and time.  Skin: No rash noted.  Psychiatric: She has a normal mood and affect. Her behavior is normal. Judgment and thought content normal.    Results for orders placed or performed in visit on 12/09/16  LP+ALT+AST Piccolo, [employer]  Result Value Ref Range   ALT (SGPT) Piccolo, Waived 18 10 - 47 U/L   AST (SGOT) Piccolo, Waived 28 11 - 38 U/L   Cholesterol Piccolo, Waived 206 (H) <200 mg/dL   HDL Chol Piccolo, Waived 43 (L) >59 mg/dL   Triglycerides Piccolo,Waived 84 <150 mg/dL   Chol/HDL Ratio Piccolo,Waive 4.8 mg/dL   LDL Chol Calc Piccolo Waived 146 (H) <100 mg/dL   VLDL Chol Calc Piccolo,Waive 17 <30 mg/dL      Assessment & Plan:   Problem List Items Addressed This Visit  Cardiovascular and Mediastinum   Essential hypertension    The current medical regimen is effective;  continue present plan and medications.       Relevant Medications   amLODipine (NORVASC) 5 MG tablet   losartan (COZAAR) 100 MG tablet   omega-3 acid ethyl esters (LOVAZA) 1 g capsule   Other Relevant Orders   Comprehensive metabolic panel   Lipid panel   CBC with Differential/Platelet   CT HEART W/O CONTRAST MEDIA - QUANT EVAL CORONARY CALCIUM     Endocrine   Hypothyroidism    The current medical regimen is effective;  continue present plan and medications.       Relevant Medications    levothyroxine (SYNTHROID, LEVOTHROID) 75 MCG tablet   Other Relevant Orders   TSH     Genitourinary   CKD (chronic kidney disease), stage III (HCC)    The current medical regimen is effective;  continue present plan and medications.       Relevant Orders   Comprehensive metabolic panel   Lipid panel   CBC with Differential/Platelet   Magnesium   CT HEART W/O CONTRAST MEDIA - QUANT EVAL CORONARY CALCIUM     Other   Hyperlipidemia    The current medical regimen is effective;  continue present plan and medications.       Relevant Medications   amLODipine (NORVASC) 5 MG tablet   losartan (COZAAR) 100 MG tablet   omega-3 acid ethyl esters (LOVAZA) 1 g capsule   Other Relevant Orders   Lipid panel   CT HEART W/O CONTRAST MEDIA - QUANT EVAL CORONARY CALCIUM   Low back pain    Ongoing  Chronic back pain with limitations due to intense work schedule and travel.      Relevant Medications   chlorzoxazone (PARAFON) 500 MG tablet   Environmental and seasonal allergies    The current medical regimen is effective;  continue present plan and medications.       Relevant Medications   Azelastine-Fluticasone (DYMISTA) 137-50 MCG/ACT SUSP   triamcinolone (NASACORT ALLERGY 24HR) 55 MCG/ACT AERO nasal inhaler   Advanced care planning/counseling discussion    A voluntary discussion about advance care planning including the explanation and discussion of advance directives was extensively discussed  with the patient.  Explanation about the health care proxy and Living will was reviewed and packet with forms with explanation of how to fill them out was given.         Other Visit Diagnoses    PE (physical exam), annual    -  Primary       Follow up plan: Return for BMP,  Lipids, ALT, AST.

## 2017-03-28 NOTE — Assessment & Plan Note (Signed)
A voluntary discussion about advance care planning including the explanation and discussion of advance directives was extensively discussed  with the patient.  Explanation about the health care proxy and Living will was reviewed and packet with forms with explanation of how to fill them out was given.    

## 2017-03-28 NOTE — Progress Notes (Signed)
Subjective:   Julia Deleon Strength is a 71 y.o. female who presents for Medicare Annual (Subsequent) preventive examination.  Review of Systems:   Cardiac Risk Factors include: dyslipidemia;hypertension;advanced age (>63men, >23 women)     Objective:     Vitals: BP 132/68 (BP Location: Left Arm, Patient Position: Sitting)   Pulse 74   Temp (!) 97.5 F (36.4 Deleon) (Oral)   Ht 5\' 4"  (1.626 m)   Wt 155 lb (70.3 kg)   LMP  (LMP Unknown)   BMI 26.61 kg/m   Body mass index is 26.61 kg/m.   Tobacco Social History   Tobacco Use  Smoking Status Never Smoker  Smokeless Tobacco Never Used     Counseling given: Not Answered   Past Medical History:  Diagnosis Date  . COPD (chronic obstructive pulmonary disease) ([place])   . Fuch's endothelial dystrophy   . Hyperlipidemia    Past Surgical History:  Procedure Laterality Date  . APPENDECTOMY    . CHOLECYSTECTOMY    . EYE SURGERY    . FOOT SURGERY Right   . laser vein surgery    . NASAL SINUS SURGERY    . TONSILLECTOMY     Family History  Problem Relation Age of Onset  . Cancer Mother        lung  . CAD Mother   . Stroke Father   . Cancer Sister        breast  . Breast cancer Sister 74  . Varicose Veins Son   . Heart disease Maternal Grandmother   . Cancer Paternal Grandmother        throat  . Emphysema Paternal Grandfather   . Fibromyalgia Sister   . Breast cancer Paternal Aunt    Social History   Substance and Sexual Activity  Sexual Activity Yes  . Birth control/protection: Post-menopausal    Outpatient Encounter Medications as of 03/28/2017  Medication Sig  . amLODipine (NORVASC) 5 MG tablet Take 1 tablet (5 mg total) daily by mouth.  . Azelastine-Fluticasone (DYMISTA) 137-50 MCG/ACT SUSP Place 1 spray 2 (two) times daily into the nose.  . chlorzoxazone (PARAFON) 500 MG tablet Take 1 tablet (500 mg total) 4 (four) times daily as needed by mouth for muscle spasms.  . ciclopirox (PENLAC) 8 % solution Apply at  bedtime topically. Apply  nail &surrounding skin. Apply daily over previous coat. After7 days, may remove with alcohol and repeat  . estradiol (ESTRACE VAGINAL) 0.1 MG/GM vaginal cream Place 0.5 Applicatorfuls vaginally 3 (three) times a week.  . fexofenadine-pseudoephedrine (ALLEGRA-D) 60-120 MG per tablet Take 1 tablet by mouth daily.   Marland Kitchen levothyroxine (SYNTHROID, LEVOTHROID) 75 MCG tablet Take 1 tablet (75 mcg total) daily by mouth.  . losartan (COZAAR) 100 MG tablet Take 1 tablet (100 mg total) daily by mouth.  . omega-3 acid ethyl esters (LOVAZA) 1 g capsule Take 4 capsules (4 g total) daily by mouth.  Marland Kitchen PROGESTERONE MICRONIZED PO Apply 4 % topically.  . triamcinolone (NASACORT ALLERGY 24HR) 55 MCG/ACT AERO nasal inhaler Place 2 sprays daily into the nose.  . [DISCONTINUED] amLODipine (NORVASC) 5 MG tablet Take 1 tablet (5 mg total) by mouth daily.  . [DISCONTINUED] Azelastine-Fluticasone (DYMISTA) 137-50 MCG/ACT SUSP Place 1 spray into the nose 2 (two) times daily.  . [DISCONTINUED] chlorzoxazone (PARAFON) 500 MG tablet Take 1 tablet (500 mg total) by mouth 4 (four) times daily as needed for muscle spasms.  . [DISCONTINUED] ciclopirox (PENLAC) 8 % solution Apply topically at bedtime.  Apply  nail &surrounding skin. Apply daily over previous coat. After7 days, may remove with alcohol and repeat  . [DISCONTINUED] levothyroxine (SYNTHROID, LEVOTHROID) 75 MCG tablet Take 1 tablet (75 mcg total) by mouth daily.  . [DISCONTINUED] losartan (COZAAR) 100 MG tablet Take 1 tablet (100 mg total) by mouth daily.  . [DISCONTINUED] omega-3 acid ethyl esters (LOVAZA) 1 g capsule TAKE 4 CAPSULES DAILY  . [DISCONTINUED] triamcinolone (NASACORT ALLERGY 24HR) 55 MCG/ACT AERO nasal inhaler Place 2 sprays into the nose daily.   No facility-administered encounter medications on file as of 03/28/2017.     Activities of Daily Living In your present state of health, do you have any difficulty performing the  following activities: 03/28/2017  Hearing? N  Vision? N  Difficulty concentrating or making decisions? N  Walking or climbing stairs? N  Dressing or bathing? N  Doing errands, shopping? N  Preparing Food and eating ? N  Using the Toilet? N  In the past six months, have you accidently leaked urine? N  Do you have problems with loss of bowel control? N  Managing your Medications? N  Managing your Finances? N  Housekeeping or managing your Housekeeping? N  Some recent data might be hidden    Patient Care Team: Guadalupe Maple, MD as PCP - General (Family Medicine)    Assessment:     Exercise Activities and Dietary recommendations Current Exercise Habits: Home exercise routine, Type of exercise: strength training/weights;walking, Time (Minutes): 60, Frequency (Times/Week): 2, Weekly Exercise (Minutes/Week): 120, Intensity: Mild, Exercise limited by: None identified  Goals    . DIET - INCREASE WATER INTAKE     Recommend drinking at least 5-6 glasses of water a day       Fall Risk Fall Risk  03/28/2017 03/28/2017 12/09/2016 09/30/2016 03/25/2016  Falls in the past year? No No No No No   Depression Screen PHQ 2/9 Scores 03/28/2017 03/28/2017 03/28/2017 12/09/2016  PHQ - 2 Score 1 1 1  0  PHQ- 9 Score 3 2 - -     Cognitive Function     6CIT Screen 03/28/2017  What Year? 0 points  What month? 0 points  What time? 0 points  Count back from 20 0 points  Months in reverse 0 points  Repeat phrase 0 points  Total Score 0    Immunization History  Administered Date(s) Administered  . Influenza, High Dose Seasonal PF 03/25/2016  . Influenza,inj,Quad PF,6+ Mos 03/25/2015  . Pneumococcal Conjugate-13 02/13/2014  . Pneumococcal Polysaccharide-23 12/09/2016  . Tdap 07/21/2010  . Zoster 05/07/2009   Screening Tests Health Maintenance  Topic Date Due  . MAMMOGRAM  09/23/2018  . TETANUS/TDAP  07/20/2020  . COLONOSCOPY  11/09/2021  . INFLUENZA VACCINE  Completed  . DEXA SCAN   Completed  . Hepatitis Deleon Screening  Completed  . PNA vac Low Risk Adult  Completed      Plan:     I have personally reviewed and addressed the Medicare Annual Wellness questionnaire and have noted the following in the patient's chart:  A. Medical and social history B. Use of alcohol, tobacco or illicit drugs  Deleon. Current medications and supplements D. Functional ability and status E.  Nutritional status F.  Physical activity G. Advance directives H. List of other physicians I.  Hospitalizations, surgeries, and ER visits in previous 12 months J.  [place] such as hearing and vision if needed, cognitive and depression L. Referrals and appointments   In addition, I have  reviewed and discussed with patient certain preventive protocols, quality metrics, and best practice recommendations. A written personalized care plan for preventive services as well as general preventive health recommendations were provided to patient.   Signed,  Tyler Aas, LPN Nurse Health Advisor   Nurse Notes: none

## 2017-03-28 NOTE — Assessment & Plan Note (Signed)
Ongoing  Chronic back pain with limitations due to intense work schedule and travel.

## 2017-03-29 ENCOUNTER — Encounter: Payer: Self-pay | Admitting: Family Medicine

## 2017-03-29 ENCOUNTER — Telehealth: Payer: Self-pay | Admitting: Family Medicine

## 2017-03-29 DIAGNOSIS — E038 Other specified hypothyroidism: Secondary | ICD-10-CM

## 2017-03-29 LAB — CBC WITH DIFFERENTIAL/PLATELET
Basophils Absolute: 0.1 10*3/uL (ref 0.0–0.2)
Basos: 1 %
EOS (ABSOLUTE): 0.2 10*3/uL (ref 0.0–0.4)
Eos: 3 %
Hematocrit: 38.3 % (ref 34.0–46.6)
Hemoglobin: 13.1 g/dL (ref 11.1–15.9)
Immature Grans (Abs): 0.1 10*3/uL (ref 0.0–0.1)
Immature Granulocytes: 1 %
Lymphocytes Absolute: 2.4 10*3/uL (ref 0.7–3.1)
Lymphs: 33 %
MCH: 30.8 pg (ref 26.6–33.0)
MCHC: 34.2 g/dL (ref 31.5–35.7)
MCV: 90 fL (ref 79–97)
Monocytes Absolute: 0.4 10*3/uL (ref 0.1–0.9)
Monocytes: 6 %
Neutrophils Absolute: 4.1 10*3/uL (ref 1.4–7.0)
Neutrophils: 56 %
Platelets: 210 10*3/uL (ref 150–379)
RBC: 4.25 x10E6/uL (ref 3.77–5.28)
RDW: 13.5 % (ref 12.3–15.4)
WBC: 7.2 10*3/uL (ref 3.4–10.8)

## 2017-03-29 LAB — COMPREHENSIVE METABOLIC PANEL
ALT: 16 IU/L (ref 0–32)
AST: 18 IU/L (ref 0–40)
Albumin/Globulin Ratio: 2 (ref 1.2–2.2)
Albumin: 4.3 g/dL (ref 3.6–4.8)
Alkaline Phosphatase: 51 IU/L (ref 39–117)
BUN/Creatinine Ratio: 18 (ref 12–28)
BUN: 19 mg/dL (ref 8–27)
Bilirubin Total: 0.6 mg/dL (ref 0.0–1.2)
CO2: 24 mmol/L (ref 20–29)
Calcium: 9.4 mg/dL (ref 8.7–10.3)
Chloride: 101 mmol/L (ref 96–106)
Creatinine, Ser: 1.03 mg/dL — ABNORMAL HIGH (ref 0.57–1.00)
GFR calc Af Amer: 64 mL/min/{1.73_m2} (ref >59)
GFR calc non Af Amer: 56 mL/min/{1.73_m2} — ABNORMAL LOW (ref >59)
Globulin, Total: 2.1 g/dL (ref 1.5–4.5)
Glucose: 84 mg/dL (ref 65–99)
Potassium: 4.5 mmol/L (ref 3.5–5.2)
Sodium: 140 mmol/L (ref 134–144)
Total Protein: 6.4 g/dL (ref 6.0–8.5)

## 2017-03-29 LAB — MAGNESIUM: Magnesium: 2.4 mg/dL — ABNORMAL HIGH (ref 1.6–2.3)

## 2017-03-29 LAB — LIPID PANEL
Chol/HDL Ratio: 4.9 ratio — ABNORMAL HIGH (ref 0.0–4.4)
Cholesterol, Total: 190 mg/dL (ref 100–199)
HDL: 39 mg/dL — ABNORMAL LOW (ref >39)
LDL Calculated: 133 mg/dL — ABNORMAL HIGH (ref 0–99)
Triglycerides: 92 mg/dL (ref 0–149)
VLDL Cholesterol Cal: 18 mg/dL (ref 5–40)

## 2017-03-29 LAB — TSH: TSH: 4.83 u[IU]/mL — ABNORMAL HIGH (ref 0.450–4.500)

## 2017-03-29 MED ORDER — LEVOTHYROXINE SODIUM 88 MCG PO TABS: 88.0000 ug | ORAL_TABLET | Freq: Every day | ORAL | 3 refills | Status: DC

## 2017-03-29 NOTE — Telephone Encounter (Signed)
Phone call Discussed with patient TSH slightly elevated we'll go ahead and increase from 75 g of levothyroxine to 88 g recheck TSH in 2 months

## 2017-03-30 DIAGNOSIS — H43813 Vitreous degeneration, bilateral: Secondary | ICD-10-CM | POA: Diagnosis not present

## 2017-04-20 DIAGNOSIS — I83893 Varicose veins of bilateral lower extremities with other complications: Secondary | ICD-10-CM | POA: Diagnosis not present

## 2017-04-25 ENCOUNTER — Other Ambulatory Visit: Payer: Self-pay | Admitting: Family Medicine

## 2017-04-25 DIAGNOSIS — I10 Essential (primary) hypertension: Secondary | ICD-10-CM

## 2017-04-26 DIAGNOSIS — I10 Essential (primary) hypertension: Secondary | ICD-10-CM | POA: Diagnosis not present

## 2017-04-26 DIAGNOSIS — E785 Hyperlipidemia, unspecified: Secondary | ICD-10-CM | POA: Diagnosis not present

## 2017-04-26 DIAGNOSIS — N183 Chronic kidney disease, stage 3 (moderate): Secondary | ICD-10-CM | POA: Diagnosis not present

## 2017-06-06 ENCOUNTER — Ambulatory Visit (INDEPENDENT_AMBULATORY_CARE_PROVIDER_SITE_OTHER): Payer: Medicare Other | Admitting: Family Medicine

## 2017-06-06 ENCOUNTER — Encounter: Payer: Self-pay | Admitting: Family Medicine

## 2017-06-06 VITALS — BP 122/67 | HR 75 | Temp 98.4°F | Wt 157.0 lb

## 2017-06-06 DIAGNOSIS — J019 Acute sinusitis, unspecified: Secondary | ICD-10-CM | POA: Diagnosis not present

## 2017-06-06 MED ORDER — CEFUROXIME AXETIL 250 MG PO TABS
250.0000 mg | ORAL_TABLET | Freq: Two times a day (BID) | ORAL | 0 refills | Status: DC
Start: 2017-06-06 — End: 2017-09-09

## 2017-06-06 MED ORDER — FLUCONAZOLE 150 MG PO TABS: 150.0000 mg | ORAL_TABLET | Freq: Once | ORAL | 2 refills | Status: AC

## 2017-06-06 NOTE — Progress Notes (Signed)
BP 122/67   Pulse 75   Temp 98.4 F (36.9 C) (Oral)   Wt 157 lb (71.2 kg)   LMP  (LMP Unknown)   SpO2 98%   BMI 26.95 kg/m    Subjective:    Patient ID: Julia Deleon, female    DOB: [birth year], 72 y.o.   MRN: [id number]  HPI: Julia Deleon is a 72 y.o. female  Chief Complaint  Patient presents with  . Headache  . Facial Pain  Patient with needs of splitting sinus headache sloshing type sensation of pressure in her head is been ongoing for 3 days L Mucinex and other over-the-counter medications for help. Mucinex has helped some. Patient with systemic symptoms of fever chills generalized achiness the seem to come on gradually over the last several days and over this weekend.  Has some mild cough  Relevant past medical, surgical, family and social history reviewed and updated as indicated. Interim medical history since our last visit reviewed. Allergies and medications reviewed and updated.  Review of Systems  Constitutional: Positive for chills, diaphoresis and fatigue.  HENT: Positive for congestion, rhinorrhea, sinus pressure, sinus pain, sneezing and sore throat.   Respiratory: Negative.   Cardiovascular: Negative.     Per HPI unless specifically indicated above     Objective:    BP 122/67   Pulse 75   Temp 98.4 F (36.9 C) (Oral)   Wt 157 lb (71.2 kg)   LMP  (LMP Unknown)   SpO2 98%   BMI 26.95 kg/m   Wt Readings from Last 3 Encounters:  06/06/17 157 lb (71.2 kg)  03/28/17 155 lb (70.3 kg)  03/28/17 155 lb (70.3 kg)    Physical Exam  Constitutional: She is oriented to person, place, and time. She appears well-developed and well-nourished.  HENT:  Head: Normocephalic and atraumatic.  Right Ear: External ear normal.  Left Ear: External ear normal.  Mouth/Throat: Oropharyngeal exudate present.  Eyes: Conjunctivae and EOM are normal.  Neck: Normal range of motion.  Cardiovascular: Normal rate, regular rhythm and normal heart sounds.  Pulmonary/Chest: Effort  normal and breath sounds normal.  Musculoskeletal: Normal range of motion.  Neurological: She is alert and oriented to person, place, and time.  Skin: No erythema.  Psychiatric: She has a normal mood and affect. Her behavior is normal. Judgment and thought content normal.    Results for orders placed or performed in visit on 03/28/17  Comprehensive metabolic panel  Result Value Ref Range   Glucose 84 65 - 99 mg/dL   BUN 19 8 - 27 mg/dL   Creatinine, Ser 1.03 (H) 0.57 - 1.00 mg/dL   GFR calc non Af Amer 56 (L) >59 mL/min/1.73   GFR calc Af Amer 64 >59 mL/min/1.73   BUN/Creatinine Ratio 18 12 - 28   Sodium 140 134 - 144 mmol/L   Potassium 4.5 3.5 - 5.2 mmol/L   Chloride 101 96 - 106 mmol/L   CO2 24 20 - 29 mmol/L   Calcium 9.4 8.7 - 10.3 mg/dL   Total Protein 6.4 6.0 - 8.5 g/dL   Albumin 4.3 3.6 - 4.8 g/dL   Globulin, Total 2.1 1.5 - 4.5 g/dL   Albumin/Globulin Ratio 2.0 1.2 - 2.2   Bilirubin Total 0.6 0.0 - 1.2 mg/dL   Alkaline Phosphatase 51 39 - 117 IU/L   AST 18 0 - 40 IU/L   ALT 16 0 - 32 IU/L  Lipid panel  Result Value Ref Range  Cholesterol, Total 190 100 - 199 mg/dL   Triglycerides 92 0 - 149 mg/dL   HDL 39 (L) >39 mg/dL   VLDL Cholesterol Cal 18 5 - 40 mg/dL   LDL Calculated 133 (H) 0 - 99 mg/dL   Chol/HDL Ratio 4.9 (H) 0.0 - 4.4 ratio  CBC with Differential/Platelet  Result Value Ref Range   WBC 7.2 3.4 - 10.8 x10E3/uL   RBC 4.25 3.77 - 5.28 x10E6/uL   Hemoglobin 13.1 11.1 - 15.9 g/dL   Hematocrit 38.3 34.0 - 46.6 %   MCV 90 79 - 97 fL   MCH 30.8 26.6 - 33.0 pg   MCHC 34.2 31.5 - 35.7 g/dL   RDW 13.5 12.3 - 15.4 %   Platelets 210 150 - 379 x10E3/uL   Neutrophils 56 Not Estab. %   Lymphs 33 Not Estab. %   Monocytes 6 Not Estab. %   Eos 3 Not Estab. %   Basos 1 Not Estab. %   Neutrophils Absolute 4.1 1.4 - 7.0 x10E3/uL   Lymphocytes Absolute 2.4 0.7 - 3.1 x10E3/uL   Monocytes Absolute 0.4 0.1 - 0.9 x10E3/uL   EOS (ABSOLUTE) 0.2 0.0 - 0.4 x10E3/uL    Basophils Absolute 0.1 0.0 - 0.2 x10E3/uL   Immature Granulocytes 1 Not Estab. %   Immature Grans (Abs) 0.1 0.0 - 0.1 x10E3/uL  TSH  Result Value Ref Range   TSH 4.830 (H) 0.450 - 4.500 uIU/mL  Magnesium  Result Value Ref Range   Magnesium 2.4 (H) 1.6 - 2.3 mg/dL      Assessment & Plan:   Problem List Items Addressed This Visit    None    Visit Diagnoses    Acute sinusitis, recurrence not specified, unspecified location    -  Primary   Relevant Medications   cefUROXime (CEFTIN) 250 MG tablet   fluconazole (DIFLUCAN) 150 MG tablet    Discussed sinusitis care and treatment use of antibiotics use of over-the-counter medications continue Mucinex Mucinex D, mention nasal rinse which patient is not going to use.  Follow up plan: Return for As scheduled.

## 2017-06-09 ENCOUNTER — Telehealth: Payer: Self-pay | Admitting: Family Medicine

## 2017-06-09 NOTE — Telephone Encounter (Signed)
Copied from [place] [id number]. Topic: General - Other >> Jun 09, 2017 10:53 AM Patrice Paradise wrote: Reason for CRM: Patient would like for Dr. Jeananne Rama to give her a call @  [phone], she would not elaborate on the reason for the call. >> Jun 09, 2017  4:45 PM Amada Kingfisher, CMA wrote: Patient was transferred to provider for telephone conversation.

## 2017-06-22 ENCOUNTER — Telehealth: Payer: Self-pay

## 2017-06-22 NOTE — Telephone Encounter (Signed)
Message relayed to patient. Verbalized understanding and denied questions.   

## 2017-06-22 NOTE — Telephone Encounter (Signed)
Patient's [employer] Was denied by insurance. Stated that patient does not have a medical necessity for drug. Please advise.

## 2017-06-22 NOTE — Telephone Encounter (Signed)
Call pt Buy over-the-counter fish oil

## 2017-07-05 ENCOUNTER — Encounter: Payer: Self-pay | Admitting: Family Medicine

## 2017-08-08 DIAGNOSIS — L814 Other melanin hyperpigmentation: Secondary | ICD-10-CM | POA: Diagnosis not present

## 2017-08-08 DIAGNOSIS — L821 Other seborrheic keratosis: Secondary | ICD-10-CM | POA: Diagnosis not present

## 2017-08-08 DIAGNOSIS — I781 Nevus, non-neoplastic: Secondary | ICD-10-CM | POA: Diagnosis not present

## 2017-08-08 DIAGNOSIS — L718 Other rosacea: Secondary | ICD-10-CM | POA: Diagnosis not present

## 2017-08-08 DIAGNOSIS — Z1283 Encounter for screening for malignant neoplasm of skin: Secondary | ICD-10-CM | POA: Diagnosis not present

## 2017-08-08 DIAGNOSIS — I8393 Asymptomatic varicose veins of bilateral lower extremities: Secondary | ICD-10-CM | POA: Diagnosis not present

## 2017-08-08 DIAGNOSIS — L918 Other hypertrophic disorders of the skin: Secondary | ICD-10-CM | POA: Diagnosis not present

## 2017-08-08 DIAGNOSIS — B351 Tinea unguium: Secondary | ICD-10-CM | POA: Diagnosis not present

## 2017-08-08 DIAGNOSIS — D1800 Hemangioma unspecified site: Secondary | ICD-10-CM | POA: Diagnosis not present

## 2017-08-08 DIAGNOSIS — B078 Other viral warts: Secondary | ICD-10-CM | POA: Diagnosis not present

## 2017-08-08 DIAGNOSIS — L729 Follicular cyst of the skin and subcutaneous tissue, unspecified: Secondary | ICD-10-CM | POA: Diagnosis not present

## 2017-08-23 DIAGNOSIS — T84213A Breakdown (mechanical) of internal fixation device of bones of foot and toes, initial encounter: Secondary | ICD-10-CM | POA: Diagnosis not present

## 2017-08-23 DIAGNOSIS — M79671 Pain in right foot: Secondary | ICD-10-CM | POA: Diagnosis not present

## 2017-08-23 DIAGNOSIS — B351 Tinea unguium: Secondary | ICD-10-CM | POA: Diagnosis not present

## 2017-08-24 ENCOUNTER — Telehealth: Payer: Self-pay | Admitting: Obstetrics and Gynecology

## 2017-08-24 NOTE — Telephone Encounter (Signed)
Progesterone and estradiol phoned in to [facility].  Spoke with randy.

## 2017-08-24 NOTE — Telephone Encounter (Signed)
The patients pharmacy [facility]) called and stated that the patients medication needs to be sent to them in [place] because ([facility]) is now closed. The two medications disclosed were estradiol (ESTRACE VAGINAL) 0.1 MG/GM vaginal cream and PROGESTERONE MICRONIZED PO, No other information was disclosed. Please advise.

## 2017-09-01 DIAGNOSIS — T84213A Breakdown (mechanical) of internal fixation device of bones of foot and toes, initial encounter: Secondary | ICD-10-CM | POA: Diagnosis not present

## 2017-09-01 DIAGNOSIS — M79671 Pain in right foot: Secondary | ICD-10-CM | POA: Diagnosis not present

## 2017-09-09 ENCOUNTER — Encounter: Payer: Self-pay | Admitting: Family Medicine

## 2017-09-09 ENCOUNTER — Ambulatory Visit (INDEPENDENT_AMBULATORY_CARE_PROVIDER_SITE_OTHER): Payer: Medicare Other | Admitting: Family Medicine

## 2017-09-09 VITALS — BP 131/67 | HR 60 | Temp 97.4°F | Ht 64.0 in | Wt 160.1 lb

## 2017-09-09 DIAGNOSIS — B370 Candidal stomatitis: Secondary | ICD-10-CM

## 2017-09-09 MED ORDER — NYSTATIN 100000 UNIT/ML MT SUSP: 5.0000 mL | Freq: Four times a day (QID) | OROMUCOSAL | 0 refills | Status: DC

## 2017-09-09 MED ORDER — FLUCONAZOLE 150 MG PO TABS: ORAL_TABLET | ORAL | 0 refills | Status: DC

## 2017-09-09 MED ORDER — FLUCONAZOLE 150 MG PO TABS: 150.0000 mg | ORAL_TABLET | Freq: Once | ORAL | 2 refills | Status: DC

## 2017-09-09 NOTE — Patient Instructions (Signed)
Follow up as scheduled.  

## 2017-09-09 NOTE — Progress Notes (Signed)
BP 131/67   Pulse 60   Temp (!) 97.4 F (36.3 C) (Oral)   Ht 5\' 4"  (1.626 m)   Wt 160 lb 1.6 oz (72.6 kg)   LMP  (LMP Unknown)   SpO2 98%   BMI 27.48 kg/m    Subjective:    Patient ID: Julia Deleon, female    DOB: [birth year], 72 y.o.   MRN: [id number]  HPI: Julia Deleon is a 72 y.o. female  Chief Complaint  Patient presents with  . Oral Swelling    pt states she has had an on and off coated thraot since finishing ceftin in January. States she usually has to take 3 diflucan with antibiotics but only took 1 after the antibiotic so she thinks it may be yeast in her throat   Pt here today with coated, rough feeling tongue and throat intermittently since taking ceftin for a sinus infection several months ago. Also having vaginal discharge and itching since. Took one diflucan soon after onset, but states it usually takes at least 3 doses for this to clear up for her. Denies dysphagia, fevers, anorexia.   Relevant past medical, surgical, family and social history reviewed and updated as indicated. Interim medical history since our last visit reviewed. Allergies and medications reviewed and updated.  Review of Systems  Per HPI unless specifically indicated above     Objective:    BP 131/67   Pulse 60   Temp (!) 97.4 F (36.3 C) (Oral)   Ht 5\' 4"  (1.626 m)   Wt 160 lb 1.6 oz (72.6 kg)   LMP  (LMP Unknown)   SpO2 98%   BMI 27.48 kg/m   Wt Readings from Last 3 Encounters:  09/09/17 160 lb 1.6 oz (72.6 kg)  06/06/17 157 lb (71.2 kg)  03/28/17 155 lb (70.3 kg)    Physical Exam  Constitutional: She is oriented to person, place, and time. She appears well-developed and well-nourished. No distress.  HENT:  Head: Atraumatic.  Right Ear: External ear normal.  Left Ear: External ear normal.  Nose: Nose normal.  Patches of white coating on oropharynx and tongue, mildly erythematous  Eyes: Pupils are equal, round, and reactive to light. Conjunctivae and EOM are normal.  Neck:  Normal range of motion. Neck supple.  Cardiovascular: Normal rate and regular rhythm.  Pulmonary/Chest: Effort normal and breath sounds normal.  Musculoskeletal: Normal range of motion.  Neurological: She is alert and oriented to person, place, and time.  Skin: Skin is warm and dry.  Psychiatric: She has a normal mood and affect. Her behavior is normal.  Nursing note and vitals reviewed.   Results for orders placed or performed in visit on 03/28/17  Comprehensive metabolic panel  Result Value Ref Range   Glucose 84 65 - 99 mg/dL   BUN 19 8 - 27 mg/dL   Creatinine, Ser 1.03 (H) 0.57 - 1.00 mg/dL   GFR calc non Af Amer 56 (L) >59 mL/min/1.73   GFR calc Af Amer 64 >59 mL/min/1.73   BUN/Creatinine Ratio 18 12 - 28   Sodium 140 134 - 144 mmol/L   Potassium 4.5 3.5 - 5.2 mmol/L   Chloride 101 96 - 106 mmol/L   CO2 24 20 - 29 mmol/L   Calcium 9.4 8.7 - 10.3 mg/dL   Total Protein 6.4 6.0 - 8.5 g/dL   Albumin 4.3 3.6 - 4.8 g/dL   Globulin, Total 2.1 1.5 - 4.5 g/dL   Albumin/Globulin Ratio 2.0 1.2 -  2.2   Bilirubin Total 0.6 0.0 - 1.2 mg/dL   Alkaline Phosphatase 51 39 - 117 IU/L   AST 18 0 - 40 IU/L   ALT 16 0 - 32 IU/L  Lipid panel  Result Value Ref Range   Cholesterol, Total 190 100 - 199 mg/dL   Triglycerides 92 0 - 149 mg/dL   HDL 39 (L) >39 mg/dL   VLDL Cholesterol Cal 18 5 - 40 mg/dL   LDL Calculated 133 (H) 0 - 99 mg/dL   Chol/HDL Ratio 4.9 (H) 0.0 - 4.4 ratio  CBC with Differential/Platelet  Result Value Ref Range   WBC 7.2 3.4 - 10.8 x10E3/uL   RBC 4.25 3.77 - 5.28 x10E6/uL   Hemoglobin 13.1 11.1 - 15.9 g/dL   Hematocrit 38.3 34.0 - 46.6 %   MCV 90 79 - 97 fL   MCH 30.8 26.6 - 33.0 pg   MCHC 34.2 31.5 - 35.7 g/dL   RDW 13.5 12.3 - 15.4 %   Platelets 210 150 - 379 x10E3/uL   Neutrophils 56 Not Estab. %   Lymphs 33 Not Estab. %   Monocytes 6 Not Estab. %   Eos 3 Not Estab. %   Basos 1 Not Estab. %   Neutrophils Absolute 4.1 1.4 - 7.0 x10E3/uL   Lymphocytes  Absolute 2.4 0.7 - 3.1 x10E3/uL   Monocytes Absolute 0.4 0.1 - 0.9 x10E3/uL   EOS (ABSOLUTE) 0.2 0.0 - 0.4 x10E3/uL   Basophils Absolute 0.1 0.0 - 0.2 x10E3/uL   Immature Granulocytes 1 Not Estab. %   Immature Grans (Abs) 0.1 0.0 - 0.1 x10E3/uL  TSH  Result Value Ref Range   TSH 4.830 (H) 0.450 - 4.500 uIU/mL  Magnesium  Result Value Ref Range   Magnesium 2.4 (H) 1.6 - 2.3 mg/dL      Assessment & Plan:   Problem List Items Addressed This Visit    None    Visit Diagnoses    Oral thrush    -  Primary   Will send diflucan tablets and nystatin mouthwash. Probiotics recommended. F/u if no improvement   Relevant Medications   nystatin (MYCOSTATIN) 100000 UNIT/ML suspension   fluconazole (DIFLUCAN) 150 MG tablet       Follow up plan: Return for as scheduled.

## 2017-09-12 ENCOUNTER — Telehealth: Payer: Self-pay | Admitting: Family Medicine

## 2017-09-12 MED ORDER — CLOTRIMAZOLE 1 % VA CREA: 1.0000 | TOPICAL_CREAM | Freq: Every day | VAGINAL | 0 refills | Status: DC

## 2017-09-12 NOTE — Telephone Encounter (Signed)
Copied from [place] [id number]. Topic: Quick Communication - See Telephone Encounter >> Sep 12, 2017  9:25 AM Vernona Rieger wrote: CRM for notification. See Telephone encounter for: 09/12/17.  Patient said she was in on Friday 5/3 for a yeast infection. She said that Apolonio Schneiders did not give her a vaginal cream. She is wanting to know if Dr Jeananne Rama would give her one Patient is out of town. She would like it to be sent to the [facility] @ [phone] (at [facility])

## 2017-09-19 NOTE — Progress Notes (Signed)
Patient ID: Julia Deleon, female   DOB: 18-Feb-1946, 72 y.o.   MRN: [id number] ANNUAL PREVENTATIVE CARE GYN  ENCOUNTER NOTE  Subjective:       Julia Deleon is a 72 y.o. G2P2 female here for a routine annual gynecologic exam.  Current complaints: 1. None  Julia Deleon is doing bioidentical compounded hormone therapy; vasomotor symptoms are minimal, most commonly bothersome in the spring.  She is wearing surgical support hose for lower extremity varicosities and this does contribute to the exacerbation of vasomotor symptoms which are reasonably tolerated.  She is not interested in any change in HRT at this time. Bowel function and bladder are stable.  No major new interval health issues have been identified.    Gynecologic History No LMP recorded (lmp unknown). Patient is postmenopausal. Contraception: post menopausal status Last Pap: 2017 neg. Results were: normal Last mammogram: 09/2016 birad 1 Results were: normal  Obstetric History OB History  No data available    Past Medical History:  Diagnosis Date  . COPD (chronic obstructive pulmonary disease) ([place])   . Fuch's endothelial dystrophy   . Hyperlipidemia     Past Surgical History:  Procedure Laterality Date  . APPENDECTOMY    . CHOLECYSTECTOMY    . EYE SURGERY    . FOOT SURGERY Right   . laser vein surgery    . NASAL SINUS SURGERY    . TONSILLECTOMY      Current Outpatient Medications on File Prior to Visit  Medication Sig Dispense Refill  . amLODipine (NORVASC) 5 MG tablet Take 1 tablet (5 mg total) daily by mouth. 90 tablet 4  . Azelastine-Fluticasone (DYMISTA) 137-50 MCG/ACT SUSP Place 1 spray 2 (two) times daily into the nose. 1 Bottle 12  . chlorzoxazone (PARAFON) 500 MG tablet Take 1 tablet (500 mg total) 4 (four) times daily as needed by mouth for muscle spasms. 30 tablet 2  . ciclopirox (PENLAC) 8 % solution Apply at bedtime topically. Apply  nail &surrounding skin. Apply daily over previous coat. After7 days, may remove  with alcohol and repeat (Patient not taking: Reported on 09/09/2017) 6.6 mL 12  . clotrimazole (GYNE-LOTRIMIN) 1 % vaginal cream Place 1 Applicatorful vaginally at bedtime. 45 g 0  . estradiol (ESTRACE VAGINAL) 0.1 MG/GM vaginal cream Place 0.5 Applicatorfuls vaginally 3 (three) times a week. 42.5 g 12  . fexofenadine-pseudoephedrine (ALLEGRA-D) 60-120 MG per tablet Take 1 tablet by mouth daily.     . fluconazole (DIFLUCAN) 150 MG tablet Take 1 tablet every 4 days 3 tablet 0  . levothyroxine (SYNTHROID, LEVOTHROID) 88 MCG tablet Take 1 tablet (88 mcg total) by mouth daily. 90 tablet 3  . losartan (COZAAR) 100 MG tablet Take 1 tablet (100 mg total) daily by mouth. 90 tablet 4  . nystatin (MYCOSTATIN) 100000 UNIT/ML suspension Take 5 mLs (500,000 Units total) by mouth 4 (four) times daily. 60 mL 0  . omega-3 acid ethyl esters (LOVAZA) 1 g capsule Take 4 capsules (4 g total) daily by mouth. 120 capsule 12  . PROGESTERONE MICRONIZED PO Apply 4 % topically.    . triamcinolone (NASACORT ALLERGY 24HR) 55 MCG/ACT AERO nasal inhaler Place 2 sprays daily into the nose. 1 Inhaler 12   No current facility-administered medications on file prior to visit.     Allergies  Allergen Reactions  . Amoxicillin-Pot Clavulanate Nausea Only  . Levofloxacin Hives  . Simvastatin Itching  . Welchol [Colesevelam Hcl] Nausea Only    Social History   Socioeconomic History  .  Marital status: Married    Spouse name: Not on file  . Number of children: Not on file  . Years of education: 4  . Highest education level: Bachelor's degree (e.g., BA, AB, BS)  Occupational History  . Not on file  Social Needs  . Financial resource strain: Not hard at all  . Food insecurity:    Worry: Never true    Inability: Never true  . Transportation needs:    Medical: No    Non-medical: No  Tobacco Use  . Smoking status: Never Smoker  . Smokeless tobacco: Never Used  Substance and Sexual Activity  . Alcohol use: No     Alcohol/week: 0.0 oz  . Drug use: No  . Sexual activity: Yes    Birth control/protection: Post-menopausal  Lifestyle  . Physical activity:    Days per week: 2 days    Minutes per session: 60 min  . Stress: Only a little  Relationships  . Social connections:    Talks on phone: More than three times a week    Gets together: Once a week    Attends religious service: More than 4 times per year    Active member of club or organization: No    Attends meetings of clubs or organizations: Never    Relationship status: Married  . Intimate partner violence:    Fear of current or ex partner: No    Emotionally abused: No    Physically abused: No    Forced sexual activity: No  Other Topics Concern  . Not on file  Social History Narrative  . Not on file    Family History  Problem Relation Age of Onset  . Cancer Mother        lung  . CAD Mother   . Stroke Father   . Cancer Sister        breast  . Breast cancer Sister 23  . Varicose Veins Son   . Heart disease Maternal Grandmother   . Cancer Paternal Grandmother        throat  . Emphysema Paternal Grandfather   . Fibromyalgia Sister   . Breast cancer Paternal Aunt     The following portions of the patient's history were reviewed and updated as appropriate: allergies, current medications, past family history, past medical history, past social history, past surgical history and problem list.  Review of Systems Review of Systems  Constitutional:       Occasional hot flashes and night sweats  HENT: Negative.   Respiratory: Negative.   Cardiovascular: Negative.   Gastrointestinal: Negative.   Genitourinary: Negative.   Musculoskeletal: Negative.   Skin: Negative.   Neurological: Negative.   Endo/Heme/Allergies: Negative.   Psychiatric/Behavioral: Negative.       Objective:   BP 131/67   Pulse 65   Ht 5\' 4"  (1.626 m)   Wt 155 lb 6.4 oz (70.5 kg)   LMP  (LMP Unknown)   BMI 26.67 kg/m  CONSTITUTIONAL: Well-developed,  well-nourished female in no acute distress.  PSYCHIATRIC: Normal mood and affect. Normal behavior. Normal judgment and thought content. [place]: Alert and oriented to person, place, and time. Normal muscle tone coordination. No cranial nerve deficit noted. HENT:  Normocephalic, atraumatic EYES: Conjunctivae and EOM are normal.  No scleral icterus.  NECK: Normal range of motion, supple, no masses.  Normal thyroid.  SKIN: Skin is warm and dry. Not diaphoretic. No erythema. No pallor.  CARDIOVASCULAR: Normal heart rate noted, regular rhythm, no murmur. RESPIRATORY:  Clear to auscultation bilaterally. Effort and breath sounds normal, no problems with respiration noted. BREASTS: Symmetric in size. No masses, skin changes, nipple drainage, or lymphadenopathy. ABDOMEN: Soft, normal bowel sounds, no distention noted.  No tenderness, rebound or guarding.  BLADDER: Normal PELVIC:  External Genitalia: Normal  BUS: Urethral caruncle 7 mm, nonfriable  Vagina: Moderate atrophic changes, non-friable polyp on left vaginal side wall, unchanged  Cervix: Normal; no lesions; No cervical motion tenderness  Uterus: Normal; midplane, normal size and shape, mobile, nontender  Adnexa: Normal; nonpalpable and nontender  RV: Decreased sphincter tone, External Exam NormaI and No Rectal Masses  MUSCULOSKELETAL: Normal range of motion. No tenderness.  No cyanosis, clubbing, or edema.  2+ distal pulses. LYMPHATIC: No Axillary, Supraclavicular, or Inguinal Adenopathy.    Assessment:   Annual gynecologic examination 72 y.o. Contraception: post menopausal status [id number] Menopausal state, minimally symptomatic, on bioidentical hormone therapy; Does not desire other intervention at this time.  Plan:  Pap: not needed Mammogram: Ordered Stool Guaiac Testing:  Ordered Labs: thru pcp Routine preventative health maintenance measures emphasized: Exercise/Diet/Weight control, Tobacco Warnings and Alcohol/Substance use  risks Recommend bioidentical hormone therapy as follows (to be continued): Progesterone cream topically twice a day Estradiol vaginal cream twice a week Return to [place], CMA  Brayton Mars, MD  Note: This dictation was prepared with Dragon dictation along with smaller phrase technology. Any transcriptional errors that result from this process are unintentional.

## 2017-09-20 DIAGNOSIS — I10 Essential (primary) hypertension: Secondary | ICD-10-CM | POA: Diagnosis not present

## 2017-09-20 DIAGNOSIS — R600 Localized edema: Secondary | ICD-10-CM | POA: Diagnosis not present

## 2017-09-20 DIAGNOSIS — N183 Chronic kidney disease, stage 3 (moderate): Secondary | ICD-10-CM | POA: Diagnosis not present

## 2017-09-21 ENCOUNTER — Ambulatory Visit (INDEPENDENT_AMBULATORY_CARE_PROVIDER_SITE_OTHER): Payer: Medicare Other | Admitting: Obstetrics and Gynecology

## 2017-09-21 ENCOUNTER — Encounter: Payer: Self-pay | Admitting: Obstetrics and Gynecology

## 2017-09-21 VITALS — BP 131/67 | HR 65 | Ht 64.0 in | Wt 155.4 lb

## 2017-09-21 DIAGNOSIS — Z780 Asymptomatic menopausal state: Secondary | ICD-10-CM

## 2017-09-21 DIAGNOSIS — Z01419 Encounter for gynecological examination (general) (routine) without abnormal findings: Secondary | ICD-10-CM | POA: Diagnosis not present

## 2017-09-21 DIAGNOSIS — Z1231 Encounter for screening mammogram for malignant neoplasm of breast: Secondary | ICD-10-CM | POA: Diagnosis not present

## 2017-09-21 DIAGNOSIS — N952 Postmenopausal atrophic vaginitis: Secondary | ICD-10-CM

## 2017-09-21 DIAGNOSIS — Z1211 Encounter for screening for malignant neoplasm of colon: Secondary | ICD-10-CM | POA: Diagnosis not present

## 2017-09-21 DIAGNOSIS — Z1239 Encounter for other screening for malignant neoplasm of breast: Secondary | ICD-10-CM

## 2017-09-21 MED ORDER — ESTRADIOL 0.1 MG/GM VA CREA: TOPICAL_CREAM | VAGINAL | 12 refills | Status: DC

## 2017-09-21 MED ORDER — PROGESTERONE MICRONIZED POWD: 6 refills | Status: DC

## 2017-09-21 NOTE — Patient Instructions (Signed)
Health Maintenance for Postmenopausal Women Menopause is a normal process in which your reproductive ability comes to an end. This process happens gradually over a span of months to years, usually between the ages of 22 and 9. Menopause is complete when you have missed 12 consecutive menstrual periods. It is important to talk with your health care provider about some of the most common conditions that affect postmenopausal women, such as heart disease, cancer, and bone loss (osteoporosis). Adopting a healthy lifestyle and getting preventive care can help to promote your health and wellness. Those actions can also lower your chances of developing some of these common conditions. What should I know about menopause? During menopause, you may experience a number of symptoms, such as:  Moderate-to-severe hot flashes.  Night sweats.  Decrease in sex drive.  Mood swings.  Headaches.  Tiredness.  Irritability.  Memory problems.  Insomnia.  Choosing to treat or not to treat menopausal changes is an individual decision that you make with your health care provider. What should I know about hormone replacement therapy and supplements? Hormone therapy products are effective for treating symptoms that are associated with menopause, such as hot flashes and night sweats. Hormone replacement carries certain risks, especially as you become older. If you are thinking about using estrogen or estrogen with progestin treatments, discuss the benefits and risks with your health care provider. What should I know about heart disease and stroke? Heart disease, heart attack, and stroke become more likely as you age. This may be due, in part, to the hormonal changes that your body experiences during menopause. These can affect how your body processes dietary fats, triglycerides, and cholesterol. Heart attack and stroke are both medical emergencies. There are many things that you can do to help prevent heart disease  and stroke:  Have your blood pressure checked at least every 1-2 years. High blood pressure causes heart disease and increases the risk of stroke.  If you are 53-22 years old, ask your health care provider if you should take aspirin to prevent a heart attack or a stroke.  Do not use any tobacco products, including cigarettes, chewing tobacco, or electronic cigarettes. If you need help quitting, ask your health care provider.  It is important to eat a healthy diet and maintain a healthy weight. ? Be sure to include plenty of vegetables, fruits, low-fat dairy products, and lean protein. ? Avoid eating foods that are high in solid fats, added sugars, or salt (sodium).  Get regular exercise. This is one of the most important things that you can do for your health. ? Try to exercise for at least 150 minutes each week. The type of exercise that you do should increase your heart rate and make you sweat. This is known as moderate-intensity exercise. ? Try to do strengthening exercises at least twice each week. Do these in addition to the moderate-intensity exercise.  Know your numbers.Ask your health care provider to check your cholesterol and your blood glucose. Continue to have your blood tested as directed by your health care provider.  What should I know about cancer screening? There are several types of cancer. Take the following steps to reduce your risk and to catch any cancer development as early as possible. Breast Cancer  Practice breast self-awareness. ? This means understanding how your breasts normally appear and feel. ? It also means doing regular breast self-exams. Let your health care provider know about any changes, no matter how small.  If you are 40  or older, have a clinician do a breast exam (clinical breast exam or CBE) every year. Depending on your age, family history, and medical history, it may be recommended that you also have a yearly breast X-ray (mammogram).  If you  have a family history of breast cancer, talk with your health care provider about genetic screening.  If you are at high risk for breast cancer, talk with your health care provider about having an MRI and a mammogram every year.  Breast cancer (BRCA) gene test is recommended for women who have family members with BRCA-related cancers. Results of the assessment will determine the need for genetic counseling and BRCA1 and for BRCA2 testing. BRCA-related cancers include these types: ? Breast. This occurs in males or females. ? Ovarian. ? Tubal. This may also be called fallopian tube cancer. ? Cancer of the abdominal or pelvic lining (peritoneal cancer). ? Prostate. ? Pancreatic.  Cervical, Uterine, and Ovarian Cancer Your health care provider may recommend that you be screened regularly for cancer of the pelvic organs. These include your ovaries, uterus, and vagina. This screening involves a pelvic exam, which includes checking for microscopic changes to the surface of your cervix (Pap test).  For women ages 21-65, health care providers may recommend a pelvic exam and a Pap test every three years. For women ages 79-65, they may recommend the Pap test and pelvic exam, combined with testing for human papilloma virus (HPV), every five years. Some types of HPV increase your risk of cervical cancer. Testing for HPV may also be done on women of any age who have unclear Pap test results.  Other health care providers may not recommend any screening for nonpregnant women who are considered low risk for pelvic cancer and have no symptoms. Ask your health care provider if a screening pelvic exam is right for you.  If you have had past treatment for cervical cancer or a condition that could lead to cancer, you need Pap tests and screening for cancer for at least 20 years after your treatment. If Pap tests have been discontinued for you, your risk factors (such as having a new sexual partner) need to be  reassessed to determine if you should start having screenings again. Some women have medical problems that increase the chance of getting cervical cancer. In these cases, your health care provider may recommend that you have screening and Pap tests more often.  If you have a family history of uterine cancer or ovarian cancer, talk with your health care provider about genetic screening.  If you have vaginal bleeding after reaching menopause, tell your health care provider.  There are currently no reliable tests available to screen for ovarian cancer.  Lung Cancer Lung cancer screening is recommended for adults 69-62 years old who are at high risk for lung cancer because of a history of smoking. A yearly low-dose CT scan of the lungs is recommended if you:  Currently smoke.  Have a history of at least 30 pack-years of smoking and you currently smoke or have quit within the past 15 years. A pack-year is smoking an average of one pack of cigarettes per day for one year.  Yearly screening should:  Continue until it has been 15 years since you quit.  Stop if you develop a health problem that would prevent you from having lung cancer treatment.  Colorectal Cancer  This type of cancer can be detected and can often be prevented.  Routine colorectal cancer screening usually begins at  age 42 and continues through age 45.  If you have risk factors for colon cancer, your health care provider may recommend that you be screened at an earlier age.  If you have a family history of colorectal cancer, talk with your health care provider about genetic screening.  Your health care provider may also recommend using home test kits to check for hidden blood in your stool.  A small camera at the end of a tube can be used to examine your colon directly (sigmoidoscopy or colonoscopy). This is done to check for the earliest forms of colorectal cancer.  Direct examination of the colon should be repeated every  5-10 years until age 71. However, if early forms of precancerous polyps or small growths are found or if you have a family history or genetic risk for colorectal cancer, you may need to be screened more often.  Skin Cancer  Check your skin from head to toe regularly.  Monitor any moles. Be sure to tell your health care provider: ? About any new moles or changes in moles, especially if there is a change in a mole's shape or color. ? If you have a mole that is larger than the size of a pencil eraser.  If any of your family members has a history of skin cancer, especially at a young age, talk with your health care provider about genetic screening.  Always use sunscreen. Apply sunscreen liberally and repeatedly throughout the day.  Whenever you are outside, protect yourself by wearing long sleeves, pants, a wide-brimmed hat, and sunglasses.  What should I know about osteoporosis? Osteoporosis is a condition in which bone destruction happens more quickly than new bone creation. After menopause, you may be at an increased risk for osteoporosis. To help prevent osteoporosis or the bone fractures that can happen because of osteoporosis, the following is recommended:  If you are 46-71 years old, get at least 1,000 mg of calcium and at least 600 mg of vitamin D per day.  If you are older than age 55 but younger than age 65, get at least 1,200 mg of calcium and at least 600 mg of vitamin D per day.  If you are older than age 54, get at least 1,200 mg of calcium and at least 800 mg of vitamin D per day.  Smoking and excessive alcohol intake increase the risk of osteoporosis. Eat foods that are rich in calcium and vitamin D, and do weight-bearing exercises several times each week as directed by your health care provider. What should I know about how menopause affects my mental health? Depression may occur at any age, but it is more common as you become older. Common symptoms of depression  include:  Low or sad mood.  Changes in sleep patterns.  Changes in appetite or eating patterns.  Feeling an overall lack of motivation or enjoyment of activities that you previously enjoyed.  Frequent crying spells.  Talk with your health care provider if you think that you are experiencing depression. What should I know about immunizations? It is important that you get and maintain your immunizations. These include:  Tetanus, diphtheria, and pertussis (Tdap) booster vaccine.  Influenza every year before the flu season begins.  Pneumonia vaccine.  Shingles vaccine.  Your health care provider may also recommend other immunizations. This information is not intended to replace advice given to you by your health care provider. Make sure you discuss any questions you have with your health care provider. Document Released: 06/18/2005  Document Revised: 11/14/2015 Document Reviewed: 01/28/2015 Elsevier Interactive Patient Education  2018 [employer] Inc[employer]  

## 2017-09-22 DIAGNOSIS — E785 Hyperlipidemia, unspecified: Secondary | ICD-10-CM | POA: Diagnosis not present

## 2017-09-22 DIAGNOSIS — N183 Chronic kidney disease, stage 3 (moderate): Secondary | ICD-10-CM | POA: Diagnosis not present

## 2017-09-22 DIAGNOSIS — I10 Essential (primary) hypertension: Secondary | ICD-10-CM | POA: Diagnosis not present

## 2017-09-23 DIAGNOSIS — J0101 Acute recurrent maxillary sinusitis: Secondary | ICD-10-CM | POA: Diagnosis not present

## 2017-09-23 DIAGNOSIS — J301 Allergic rhinitis due to pollen: Secondary | ICD-10-CM | POA: Diagnosis not present

## 2017-09-26 ENCOUNTER — Ambulatory Visit: Payer: Medicare Other | Admitting: Family Medicine

## 2017-09-27 ENCOUNTER — Ambulatory Visit
Admission: RE | Admit: 2017-09-27 | Discharge: 2017-09-27 | Disposition: A | Discharge status: Home / Self Care | Payer: Medicare Other | Source: Ambulatory Visit | Attending: Obstetrics and Gynecology | Admitting: Obstetrics and Gynecology

## 2017-09-27 DIAGNOSIS — Z1239 Encounter for other screening for malignant neoplasm of breast: Secondary | ICD-10-CM

## 2017-09-27 DIAGNOSIS — Z1231 Encounter for screening mammogram for malignant neoplasm of breast: Secondary | ICD-10-CM | POA: Diagnosis not present

## 2017-10-17 ENCOUNTER — Ambulatory Visit (INDEPENDENT_AMBULATORY_CARE_PROVIDER_SITE_OTHER): Payer: Medicare Other | Admitting: Family Medicine

## 2017-10-17 ENCOUNTER — Encounter: Payer: Self-pay | Admitting: Family Medicine

## 2017-10-17 VITALS — BP 132/70 | HR 71 | Ht 65.0 in | Wt 155.0 lb

## 2017-10-17 DIAGNOSIS — I10 Essential (primary) hypertension: Secondary | ICD-10-CM | POA: Diagnosis not present

## 2017-10-17 DIAGNOSIS — R0602 Shortness of breath: Secondary | ICD-10-CM | POA: Diagnosis not present

## 2017-10-17 DIAGNOSIS — E782 Mixed hyperlipidemia: Secondary | ICD-10-CM | POA: Diagnosis not present

## 2017-10-17 DIAGNOSIS — E038 Other specified hypothyroidism: Secondary | ICD-10-CM | POA: Diagnosis not present

## 2017-10-17 LAB — LP+ALT+AST PICCOLO, WAIVED
ALT (SGPT) Piccolo, Waived: 26 U/L (ref 10–47)
AST (SGOT) Piccolo, Waived: 25 U/L (ref 11–38)
Chol/HDL Ratio Piccolo,Waive: 4.9 mg/dL
Cholesterol Piccolo, Waived: 213 mg/dL — ABNORMAL HIGH (ref <200)
HDL Chol Piccolo, Waived: 44 mg/dL — ABNORMAL LOW (ref >59)
LDL Chol Calc Piccolo Waived: 147 mg/dL — ABNORMAL HIGH (ref <100)
Triglycerides Piccolo,Waived: 110 mg/dL (ref <150)
VLDL Chol Calc Piccolo,Waive: 22 mg/dL (ref <30)

## 2017-10-17 MED ORDER — CHLORZOXAZONE 500 MG PO TABS: 500.0000 mg | ORAL_TABLET | Freq: Four times a day (QID) | ORAL | 2 refills | Status: DC | PRN

## 2017-10-17 NOTE — Assessment & Plan Note (Signed)
The current medical regimen is effective;  continue present plan and medications.  

## 2017-10-17 NOTE — Assessment & Plan Note (Signed)
Because of shortness of breath with exertion will refer to cardiology to further evaluate

## 2017-10-17 NOTE — Progress Notes (Signed)
BP 132/70 (BP Location: Left Arm)   Pulse 71   Ht 5\' 5"  (1.651 m)   Wt 155 lb (70.3 kg)   LMP  (LMP Unknown)   SpO2 98%   BMI 25.79 kg/m    Subjective:    Patient ID: Julia Deleon, female    DOB: 06-30-1945, 72 y.o.   MRN: [id number]  HPI: Julia Deleon is a 72 y.o. female  Chief Complaint  Patient presents with  . Follow-up  . Hypertension  Patient with multiple concerns.  Blood pressure which is doing good on recheck. Patient is tried to start walking and has noticed his chest heaviness shortness of breath exertion.  Unable to walk and keep up with her husband anymore. This is been ongoing and worsening over this last year.  Reviewed patient's chronic stress over the last 6 years with fatigue. Discussed depression and sleep apnea which patient denies both will at least observe for sleep apnea by her husband. Good reports from nephrology.  Relevant past medical, surgical, family and social history reviewed and updated as indicated. Interim medical history since our last visit reviewed. Allergies and medications reviewed and updated.  Review of Systems  Constitutional: Negative.   Respiratory: Negative.   Cardiovascular: Negative.     Per HPI unless specifically indicated above     Objective:    BP 132/70 (BP Location: Left Arm)   Pulse 71   Ht 5\' 5"  (1.651 m)   Wt 155 lb (70.3 kg)   LMP  (LMP Unknown)   SpO2 98%   BMI 25.79 kg/m   Wt Readings from Last 3 Encounters:  10/17/17 155 lb (70.3 kg)  09/21/17 155 lb 6.4 oz (70.5 kg)  09/09/17 160 lb 1.6 oz (72.6 kg)    Physical Exam  Constitutional: She is oriented to person, place, and time. She appears well-developed and well-nourished.  HENT:  Head: Normocephalic and atraumatic.  Eyes: Conjunctivae and EOM are normal.  Neck: Normal range of motion.  Cardiovascular: Normal rate, regular rhythm and normal heart sounds.  Pulmonary/Chest: Effort normal and breath sounds normal.  Musculoskeletal: Normal range of  motion.  Neurological: She is alert and oriented to person, place, and time.  Skin: No erythema.  Psychiatric: She has a normal mood and affect. Her behavior is normal. Judgment and thought content normal.    Results for orders placed or performed in visit on 03/28/17  Comprehensive metabolic panel  Result Value Ref Range   Glucose 84 65 - 99 mg/dL   BUN 19 8 - 27 mg/dL   Creatinine, Ser 1.03 (H) 0.57 - 1.00 mg/dL   GFR calc non Af Amer 56 (L) >59 mL/min/1.73   GFR calc Af Amer 64 >59 mL/min/1.73   BUN/Creatinine Ratio 18 12 - 28   Sodium 140 134 - 144 mmol/L   Potassium 4.5 3.5 - 5.2 mmol/L   Chloride 101 96 - 106 mmol/L   CO2 24 20 - 29 mmol/L   Calcium 9.4 8.7 - 10.3 mg/dL   Total Protein 6.4 6.0 - 8.5 g/dL   Albumin 4.3 3.6 - 4.8 g/dL   Globulin, Total 2.1 1.5 - 4.5 g/dL   Albumin/Globulin Ratio 2.0 1.2 - 2.2   Bilirubin Total 0.6 0.0 - 1.2 mg/dL   Alkaline Phosphatase 51 39 - 117 IU/L   AST 18 0 - 40 IU/L   ALT 16 0 - 32 IU/L  Lipid panel  Result Value Ref Range   Cholesterol, Total 190 100 -  199 mg/dL   Triglycerides 92 0 - 149 mg/dL   HDL 39 (L) >39 mg/dL   VLDL Cholesterol Cal 18 5 - 40 mg/dL   LDL Calculated 133 (H) 0 - 99 mg/dL   Chol/HDL Ratio 4.9 (H) 0.0 - 4.4 ratio  CBC with Differential/Platelet  Result Value Ref Range   WBC 7.2 3.4 - 10.8 x10E3/uL   RBC 4.25 3.77 - 5.28 x10E6/uL   Hemoglobin 13.1 11.1 - 15.9 g/dL   Hematocrit 38.3 34.0 - 46.6 %   MCV 90 79 - 97 fL   MCH 30.8 26.6 - 33.0 pg   MCHC 34.2 31.5 - 35.7 g/dL   RDW 13.5 12.3 - 15.4 %   Platelets 210 150 - 379 x10E3/uL   Neutrophils 56 Not Estab. %   Lymphs 33 Not Estab. %   Monocytes 6 Not Estab. %   Eos 3 Not Estab. %   Basos 1 Not Estab. %   Neutrophils Absolute 4.1 1.4 - 7.0 x10E3/uL   Lymphocytes Absolute 2.4 0.7 - 3.1 x10E3/uL   Monocytes Absolute 0.4 0.1 - 0.9 x10E3/uL   EOS (ABSOLUTE) 0.2 0.0 - 0.4 x10E3/uL   Basophils Absolute 0.1 0.0 - 0.2 x10E3/uL   Immature Granulocytes 1 Not  Estab. %   Immature Grans (Abs) 0.1 0.0 - 0.1 x10E3/uL  TSH  Result Value Ref Range   TSH 4.830 (H) 0.450 - 4.500 uIU/mL  Magnesium  Result Value Ref Range   Magnesium 2.4 (H) 1.6 - 2.3 mg/dL      Assessment & Plan:   Problem List Items Addressed This Visit      Cardiovascular and Mediastinum   Essential (primary) hypertension - Primary    The current medical regimen is effective;  continue present plan and medications.         Endocrine   Hypothyroidism   Relevant Orders   TSH     Other   Familial multiple lipoprotein-type hyperlipidemia   Short of breath on exertion    Because of shortness of breath with exertion will refer to cardiology to further evaluate      Relevant Orders   EKG 12-Lead (Completed)   Ambulatory referral to Cardiology   CBC with Differential/Platelet   TSH       Follow up plan: Return in about 6 months (around 04/18/2018) for Physical Exam.

## 2017-10-18 ENCOUNTER — Encounter: Payer: Self-pay | Admitting: Family Medicine

## 2017-10-18 ENCOUNTER — Telehealth: Payer: Self-pay

## 2017-10-18 LAB — CBC WITH DIFFERENTIAL/PLATELET
Basophils Absolute: 0 10*3/uL (ref 0.0–0.2)
Basos: 1 %
EOS (ABSOLUTE): 0.2 10*3/uL (ref 0.0–0.4)
Eos: 2 %
Hematocrit: 38.6 % (ref 34.0–46.6)
Hemoglobin: 13.2 g/dL (ref 11.1–15.9)
Immature Grans (Abs): 0 10*3/uL (ref 0.0–0.1)
Immature Granulocytes: 1 %
Lymphocytes Absolute: 2.1 10*3/uL (ref 0.7–3.1)
Lymphs: 32 %
MCH: 30.5 pg (ref 26.6–33.0)
MCHC: 34.2 g/dL (ref 31.5–35.7)
MCV: 89 fL (ref 79–97)
Monocytes Absolute: 0.4 10*3/uL (ref 0.1–0.9)
Monocytes: 6 %
Neutrophils Absolute: 3.8 10*3/uL (ref 1.4–7.0)
Neutrophils: 58 %
Platelets: 188 10*3/uL (ref 150–450)
RBC: 4.33 x10E6/uL (ref 3.77–5.28)
RDW: 14 % (ref 12.3–15.4)
WBC: 6.6 10*3/uL (ref 3.4–10.8)

## 2017-10-18 LAB — TSH: TSH: 3.21 u[IU]/mL (ref 0.450–4.500)

## 2017-10-20 DIAGNOSIS — I83893 Varicose veins of bilateral lower extremities with other complications: Secondary | ICD-10-CM | POA: Diagnosis not present

## 2017-10-25 DIAGNOSIS — H43813 Vitreous degeneration, bilateral: Secondary | ICD-10-CM | POA: Diagnosis not present

## 2017-10-25 NOTE — Telephone Encounter (Signed)
Phone call Discussed with patient labs are normal Patient still has not heard from cardiology referral will make sure cardiology referral happens.

## 2017-11-01 DIAGNOSIS — J439 Emphysema, unspecified: Secondary | ICD-10-CM | POA: Diagnosis not present

## 2017-11-01 DIAGNOSIS — E039 Hypothyroidism, unspecified: Secondary | ICD-10-CM | POA: Diagnosis not present

## 2017-11-01 DIAGNOSIS — I83893 Varicose veins of bilateral lower extremities with other complications: Secondary | ICD-10-CM | POA: Diagnosis not present

## 2017-11-01 DIAGNOSIS — R0602 Shortness of breath: Secondary | ICD-10-CM | POA: Diagnosis not present

## 2017-11-01 DIAGNOSIS — R600 Localized edema: Secondary | ICD-10-CM | POA: Diagnosis not present

## 2017-11-01 DIAGNOSIS — R0609 Other forms of dyspnea: Secondary | ICD-10-CM | POA: Diagnosis not present

## 2017-11-01 DIAGNOSIS — I10 Essential (primary) hypertension: Secondary | ICD-10-CM | POA: Diagnosis not present

## 2017-11-01 DIAGNOSIS — N183 Chronic kidney disease, stage 3 (moderate): Secondary | ICD-10-CM | POA: Diagnosis not present

## 2017-11-01 DIAGNOSIS — E782 Mixed hyperlipidemia: Secondary | ICD-10-CM | POA: Diagnosis not present

## 2017-11-01 DIAGNOSIS — I208 Other forms of angina pectoris: Secondary | ICD-10-CM | POA: Diagnosis not present

## 2017-11-04 DIAGNOSIS — J449 Chronic obstructive pulmonary disease, unspecified: Secondary | ICD-10-CM | POA: Diagnosis not present

## 2017-11-04 DIAGNOSIS — R0609 Other forms of dyspnea: Secondary | ICD-10-CM | POA: Diagnosis not present

## 2017-11-04 DIAGNOSIS — J439 Emphysema, unspecified: Secondary | ICD-10-CM | POA: Diagnosis not present

## 2017-11-04 DIAGNOSIS — R0600 Dyspnea, unspecified: Secondary | ICD-10-CM | POA: Diagnosis not present

## 2017-11-14 DIAGNOSIS — R0609 Other forms of dyspnea: Secondary | ICD-10-CM | POA: Diagnosis not present

## 2017-11-14 DIAGNOSIS — I208 Other forms of angina pectoris: Secondary | ICD-10-CM | POA: Diagnosis not present

## 2017-11-14 DIAGNOSIS — R0602 Shortness of breath: Secondary | ICD-10-CM | POA: Diagnosis not present

## 2017-11-15 DIAGNOSIS — I208 Other forms of angina pectoris: Secondary | ICD-10-CM | POA: Diagnosis not present

## 2017-11-15 DIAGNOSIS — R0602 Shortness of breath: Secondary | ICD-10-CM | POA: Diagnosis not present

## 2017-11-15 DIAGNOSIS — R0609 Other forms of dyspnea: Secondary | ICD-10-CM | POA: Diagnosis not present

## 2017-11-17 ENCOUNTER — Other Ambulatory Visit: Payer: Self-pay | Admitting: Family Medicine

## 2017-11-17 DIAGNOSIS — E785 Hyperlipidemia, unspecified: Secondary | ICD-10-CM

## 2017-11-18 DIAGNOSIS — R0602 Shortness of breath: Secondary | ICD-10-CM | POA: Diagnosis not present

## 2017-11-18 DIAGNOSIS — I10 Essential (primary) hypertension: Secondary | ICD-10-CM | POA: Diagnosis not present

## 2017-11-18 DIAGNOSIS — I208 Other forms of angina pectoris: Secondary | ICD-10-CM | POA: Diagnosis not present

## 2017-11-18 DIAGNOSIS — R600 Localized edema: Secondary | ICD-10-CM | POA: Diagnosis not present

## 2017-11-18 DIAGNOSIS — E039 Hypothyroidism, unspecified: Secondary | ICD-10-CM | POA: Diagnosis not present

## 2017-11-18 DIAGNOSIS — J439 Emphysema, unspecified: Secondary | ICD-10-CM | POA: Diagnosis not present

## 2017-11-18 DIAGNOSIS — I83893 Varicose veins of bilateral lower extremities with other complications: Secondary | ICD-10-CM | POA: Diagnosis not present

## 2017-11-18 DIAGNOSIS — N183 Chronic kidney disease, stage 3 (moderate): Secondary | ICD-10-CM | POA: Diagnosis not present

## 2017-11-18 DIAGNOSIS — E782 Mixed hyperlipidemia: Secondary | ICD-10-CM | POA: Diagnosis not present

## 2017-11-23 ENCOUNTER — Ambulatory Visit (INDEPENDENT_AMBULATORY_CARE_PROVIDER_SITE_OTHER): Payer: Medicare Other | Admitting: Family Medicine

## 2017-11-23 ENCOUNTER — Encounter: Payer: Self-pay | Admitting: Family Medicine

## 2017-11-23 DIAGNOSIS — R0602 Shortness of breath: Secondary | ICD-10-CM

## 2017-11-23 NOTE — Assessment & Plan Note (Signed)
Reviewed notes and further discussion patient remains very concerned about possibility of coronary artery disease and further evaluation.  Patient has decided to go ahead with cardiac catheterization as scheduled.  Reviewed possible outcomes with patient.

## 2017-11-23 NOTE — Progress Notes (Signed)
BP (!) 153/71   Pulse 71   Wt 155 lb (70.3 kg)   LMP  (LMP Unknown)   SpO2 98%   BMI 25.79 kg/m    Subjective:    Patient ID: Julia Deleon, female    DOB: Mar 27, 1946, 72 y.o.   MRN: [id number]  HPI: Julia Deleon is a 72 y.o. female  Chief Complaint  Patient presents with  . Follow-up  Patient accompanied by her husband, who assists with history Patient question review of cardiology tests and notes along with pulmonary reviewing notes.  Cardiologist ultimately has recommended cardiac catheterization to review and answer patient's questions about shortness of breath.  Patient's had essentially negative pulmonary and cardiac work-up but still has profound shortness of breath symptoms. History complicated by a great deal of stress going on patient's life currently. Reviewed cardiology notes pulmonary notes along with cardiology tests.  Relevant past medical, surgical, family and social history reviewed and updated as indicated. Interim medical history since our last visit reviewed. Allergies and medications reviewed and updated.  Review of Systems  Constitutional: Negative.   Respiratory: Negative.   Cardiovascular: Negative.     Per HPI unless specifically indicated above     Objective:    BP (!) 153/71   Pulse 71   Wt 155 lb (70.3 kg)   LMP  (LMP Unknown)   SpO2 98%   BMI 25.79 kg/m   Wt Readings from Last 3 Encounters:  11/23/17 155 lb (70.3 kg)  10/17/17 155 lb (70.3 kg)  09/21/17 155 lb 6.4 oz (70.5 kg)    Physical Exam  Constitutional: She is oriented to person, place, and time. She appears well-developed and well-nourished.  HENT:  Head: Normocephalic and atraumatic.  Eyes: Conjunctivae and EOM are normal.  Neck: Normal range of motion.  Pulmonary/Chest: Effort normal.  Musculoskeletal: Normal range of motion.  Neurological: She is alert and oriented to person, place, and time.  Skin: No erythema.  Psychiatric: She has a normal mood and affect. Her  behavior is normal. Judgment and thought content normal.    Results for orders placed or performed in visit on 10/17/17  LP+ALT+AST Piccolo, [employer]  Result Value Ref Range   ALT (SGPT) Piccolo, Waived 26 10 - 47 U/L   AST (SGOT) Piccolo, Waived 25 11 - 38 U/L   Cholesterol Piccolo, Waived 213 (H) <200 mg/dL   HDL Chol Piccolo, Waived 44 (L) >59 mg/dL   Triglycerides Piccolo,Waived 110 <150 mg/dL   Chol/HDL Ratio Piccolo,Waive 4.9 mg/dL   LDL Chol Calc Piccolo Waived 147 (H) <100 mg/dL   VLDL Chol Calc Piccolo,Waive 22 <30 mg/dL  CBC with Differential/Platelet  Result Value Ref Range   WBC 6.6 3.4 - 10.8 x10E3/uL   RBC 4.33 3.77 - 5.28 x10E6/uL   Hemoglobin 13.2 11.1 - 15.9 g/dL   Hematocrit 38.6 34.0 - 46.6 %   MCV 89 79 - 97 fL   MCH 30.5 26.6 - 33.0 pg   MCHC 34.2 31.5 - 35.7 g/dL   RDW 14.0 12.3 - 15.4 %   Platelets 188 150 - 450 x10E3/uL   Neutrophils 58 Not Estab. %   Lymphs 32 Not Estab. %   Monocytes 6 Not Estab. %   Eos 2 Not Estab. %   Basos 1 Not Estab. %   Neutrophils Absolute 3.8 1.4 - 7.0 x10E3/uL   Lymphocytes Absolute 2.1 0.7 - 3.1 x10E3/uL   Monocytes Absolute 0.4 0.1 - 0.9 x10E3/uL   EOS (ABSOLUTE)  0.2 0.0 - 0.4 x10E3/uL   Basophils Absolute 0.0 0.0 - 0.2 x10E3/uL   Immature Granulocytes 1 Not Estab. %   Immature Grans (Abs) 0.0 0.0 - 0.1 x10E3/uL  TSH  Result Value Ref Range   TSH 3.210 0.450 - 4.500 uIU/mL      Assessment & Plan:   Problem List Items Addressed This Visit      Other   Short of breath on exertion    Reviewed notes and further discussion patient remains very concerned about possibility of coronary artery disease and further evaluation.  Patient has decided to go ahead with cardiac catheterization as scheduled.  Reviewed possible outcomes with patient.          Follow up plan: Return if symptoms worsen or fail to improve.

## 2017-12-06 ENCOUNTER — Ambulatory Visit
Admission: RE | Admit: 2017-12-06 | Discharge: 2017-12-06 | Disposition: A | Discharge status: Home / Self Care | Payer: Medicare Other | Source: Ambulatory Visit | Attending: Internal Medicine | Admitting: Internal Medicine

## 2017-12-06 ENCOUNTER — Encounter: Payer: Self-pay | Admitting: *Deleted

## 2017-12-06 ENCOUNTER — Encounter
Admission: RE | Disposition: A | Discharge status: Home / Self Care | Payer: Self-pay | Source: Ambulatory Visit | Attending: Internal Medicine

## 2017-12-06 DIAGNOSIS — Z880 Allergy status to penicillin: Secondary | ICD-10-CM | POA: Insufficient documentation

## 2017-12-06 DIAGNOSIS — Z8349 Family history of other endocrine, nutritional and metabolic diseases: Secondary | ICD-10-CM | POA: Insufficient documentation

## 2017-12-06 DIAGNOSIS — I129 Hypertensive chronic kidney disease with stage 1 through stage 4 chronic kidney disease, or unspecified chronic kidney disease: Secondary | ICD-10-CM | POA: Insufficient documentation

## 2017-12-06 DIAGNOSIS — Z841 Family history of disorders of kidney and ureter: Secondary | ICD-10-CM | POA: Insufficient documentation

## 2017-12-06 DIAGNOSIS — Z8249 Family history of ischemic heart disease and other diseases of the circulatory system: Secondary | ICD-10-CM | POA: Diagnosis not present

## 2017-12-06 DIAGNOSIS — Z823 Family history of stroke: Secondary | ICD-10-CM | POA: Insufficient documentation

## 2017-12-06 DIAGNOSIS — Z8261 Family history of arthritis: Secondary | ICD-10-CM | POA: Insufficient documentation

## 2017-12-06 DIAGNOSIS — E039 Hypothyroidism, unspecified: Secondary | ICD-10-CM | POA: Insufficient documentation

## 2017-12-06 DIAGNOSIS — Z9049 Acquired absence of other specified parts of digestive tract: Secondary | ICD-10-CM | POA: Insufficient documentation

## 2017-12-06 DIAGNOSIS — J439 Emphysema, unspecified: Secondary | ICD-10-CM | POA: Diagnosis not present

## 2017-12-06 DIAGNOSIS — R0602 Shortness of breath: Secondary | ICD-10-CM | POA: Diagnosis not present

## 2017-12-06 DIAGNOSIS — Z79890 Hormone replacement therapy (postmenopausal): Secondary | ICD-10-CM | POA: Diagnosis not present

## 2017-12-06 DIAGNOSIS — Z79899 Other long term (current) drug therapy: Secondary | ICD-10-CM | POA: Insufficient documentation

## 2017-12-06 DIAGNOSIS — M1990 Unspecified osteoarthritis, unspecified site: Secondary | ICD-10-CM | POA: Diagnosis not present

## 2017-12-06 DIAGNOSIS — Z881 Allergy status to other antibiotic agents status: Secondary | ICD-10-CM | POA: Diagnosis not present

## 2017-12-06 DIAGNOSIS — E785 Hyperlipidemia, unspecified: Secondary | ICD-10-CM | POA: Insufficient documentation

## 2017-12-06 DIAGNOSIS — R600 Localized edema: Secondary | ICD-10-CM | POA: Diagnosis not present

## 2017-12-06 DIAGNOSIS — Z888 Allergy status to other drugs, medicaments and biological substances status: Secondary | ICD-10-CM | POA: Diagnosis not present

## 2017-12-06 DIAGNOSIS — Z9849 Cataract extraction status, unspecified eye: Secondary | ICD-10-CM | POA: Insufficient documentation

## 2017-12-06 DIAGNOSIS — Z9889 Other specified postprocedural states: Secondary | ICD-10-CM | POA: Diagnosis not present

## 2017-12-06 DIAGNOSIS — I25119 Atherosclerotic heart disease of native coronary artery with unspecified angina pectoris: Secondary | ICD-10-CM | POA: Diagnosis not present

## 2017-12-06 DIAGNOSIS — I8393 Asymptomatic varicose veins of bilateral lower extremities: Secondary | ICD-10-CM | POA: Diagnosis not present

## 2017-12-06 DIAGNOSIS — N183 Chronic kidney disease, stage 3 (moderate): Secondary | ICD-10-CM | POA: Insufficient documentation

## 2017-12-06 HISTORY — PX: RIGHT/LEFT HEART CATH AND CORONARY ANGIOGRAPHY: CATH118266

## 2017-12-06 SURGERY — RIGHT/LEFT HEART CATH AND CORONARY ANGIOGRAPHY
Anesthesia: Moderate Sedation

## 2017-12-06 MED ORDER — SODIUM CHLORIDE 0.9% FLUSH: 3.0000 mL | INTRAVENOUS | Status: DC | PRN

## 2017-12-06 MED ORDER — FENTANYL CITRATE (PF) 100 MCG/2ML IJ SOLN
INTRAMUSCULAR | Status: AC
  Filled 2017-12-06: qty 2

## 2017-12-06 MED ORDER — MIDAZOLAM HCL 2 MG/2ML IJ SOLN
INTRAMUSCULAR | Status: DC | PRN
  Administered 2017-12-06: 1 mg via INTRAVENOUS

## 2017-12-06 MED ORDER — SODIUM CHLORIDE 0.9 % WEIGHT BASED INFUSION
3.0000 mL/kg/h | INTRAVENOUS | Status: AC
  Administered 2017-12-06: 3 mL/kg/h via INTRAVENOUS

## 2017-12-06 MED ORDER — SODIUM CHLORIDE 0.9% FLUSH: 3.0000 mL | Freq: Two times a day (BID) | INTRAVENOUS | Status: DC

## 2017-12-06 MED ORDER — FENTANYL CITRATE (PF) 100 MCG/2ML IJ SOLN
INTRAMUSCULAR | Status: DC | PRN
  Administered 2017-12-06: 25 ug via INTRAVENOUS

## 2017-12-06 MED ORDER — SODIUM CHLORIDE 0.9 % WEIGHT BASED INFUSION: 1.0000 mL/kg/h | INTRAVENOUS | Status: DC

## 2017-12-06 MED ORDER — ACETAMINOPHEN 325 MG PO TABS: 650.0000 mg | ORAL_TABLET | ORAL | Status: DC | PRN

## 2017-12-06 MED ORDER — ASPIRIN 81 MG PO CHEW
CHEWABLE_TABLET | ORAL | Status: AC
  Filled 2017-12-06: qty 1

## 2017-12-06 MED ORDER — ASPIRIN 81 MG PO CHEW
81.0000 mg | CHEWABLE_TABLET | ORAL | Status: AC
  Administered 2017-12-06: 81 mg via ORAL

## 2017-12-06 MED ORDER — MIDAZOLAM HCL 2 MG/2ML IJ SOLN
INTRAMUSCULAR | Status: AC
  Filled 2017-12-06: qty 2

## 2017-12-06 MED ORDER — ONDANSETRON HCL 4 MG/2ML IJ SOLN: 4.0000 mg | Freq: Four times a day (QID) | INTRAMUSCULAR | Status: DC | PRN

## 2017-12-06 MED ORDER — LIDOCAINE HCL (PF) 1 % IJ SOLN
INTRAMUSCULAR | Status: AC
  Filled 2017-12-06: qty 30

## 2017-12-06 MED ORDER — SODIUM CHLORIDE 0.9 % IV SOLN: 250.0000 mL | INTRAVENOUS | Status: DC | PRN

## 2017-12-06 SURGICAL SUPPLY — 12 items
CATH INFINITI 5FR ANG PIGTAIL (CATHETERS) ×3 IMPLANT
CATH INFINITI 5FR JL4 (CATHETERS) ×3 IMPLANT
CATH INFINITI JR4 5F (CATHETERS) ×3 IMPLANT
CATH SWANZ 7F THERMO (CATHETERS) ×3 IMPLANT
KIT MANI 3VAL PERCEP (MISCELLANEOUS) ×3 IMPLANT
KIT RIGHT HEART (MISCELLANEOUS) ×3 IMPLANT
NEEDLE PERC 18GX7CM (NEEDLE) ×3 IMPLANT
PACK CARDIAC CATH (CUSTOM PROCEDURE TRAY) ×3 IMPLANT
SHEATH AVANTI 5FR X 11CM (SHEATH) ×3 IMPLANT
SHEATH AVANTI 7FRX11 (SHEATH) ×3 IMPLANT
WIRE EMERALD 3MM-J .035X260CM (WIRE) ×3 IMPLANT
WIRE GUIDERIGHT .035X150 (WIRE) ×3 IMPLANT

## 2017-12-06 NOTE — Discharge Instructions (Signed)
° ° ° ° ° ° ° °  Per Dr Clayborn Bigness,  pease leave your compression leggings off x 24 hours to prevent straining/pressure on your surgical site.   Dr Clayborn Bigness will plan to discuss options based on your procedure results at your follow up appointment.

## 2017-12-07 ENCOUNTER — Encounter: Payer: Self-pay | Admitting: Internal Medicine

## 2017-12-12 DIAGNOSIS — E782 Mixed hyperlipidemia: Secondary | ICD-10-CM | POA: Diagnosis not present

## 2017-12-12 DIAGNOSIS — N183 Chronic kidney disease, stage 3 (moderate): Secondary | ICD-10-CM | POA: Diagnosis not present

## 2017-12-12 DIAGNOSIS — R600 Localized edema: Secondary | ICD-10-CM | POA: Diagnosis not present

## 2017-12-12 DIAGNOSIS — I83893 Varicose veins of bilateral lower extremities with other complications: Secondary | ICD-10-CM | POA: Diagnosis not present

## 2017-12-12 DIAGNOSIS — R0602 Shortness of breath: Secondary | ICD-10-CM | POA: Diagnosis not present

## 2017-12-12 DIAGNOSIS — I10 Essential (primary) hypertension: Secondary | ICD-10-CM | POA: Diagnosis not present

## 2017-12-12 DIAGNOSIS — J439 Emphysema, unspecified: Secondary | ICD-10-CM | POA: Diagnosis not present

## 2017-12-12 DIAGNOSIS — E039 Hypothyroidism, unspecified: Secondary | ICD-10-CM | POA: Diagnosis not present

## 2017-12-12 DIAGNOSIS — I208 Other forms of angina pectoris: Secondary | ICD-10-CM | POA: Diagnosis not present

## 2017-12-12 DIAGNOSIS — Z9889 Other specified postprocedural states: Secondary | ICD-10-CM | POA: Diagnosis not present

## 2017-12-12 DIAGNOSIS — R0609 Other forms of dyspnea: Secondary | ICD-10-CM | POA: Diagnosis not present

## 2017-12-15 ENCOUNTER — Other Ambulatory Visit: Payer: Self-pay | Admitting: Family Medicine

## 2017-12-15 MED ORDER — ROSUVASTATIN CALCIUM 5 MG PO TABS: 5.0000 mg | ORAL_TABLET | Freq: Every day | ORAL | 1 refills | Status: DC

## 2017-12-15 NOTE — Progress Notes (Signed)
[place]

## 2017-12-19 DIAGNOSIS — H35371 Puckering of macula, right eye: Secondary | ICD-10-CM | POA: Diagnosis not present

## 2017-12-19 DIAGNOSIS — H439 Unspecified disorder of vitreous body: Secondary | ICD-10-CM | POA: Diagnosis not present

## 2017-12-28 DIAGNOSIS — J449 Chronic obstructive pulmonary disease, unspecified: Secondary | ICD-10-CM | POA: Diagnosis not present

## 2017-12-28 DIAGNOSIS — M810 Age-related osteoporosis without current pathological fracture: Secondary | ICD-10-CM | POA: Diagnosis not present

## 2017-12-28 DIAGNOSIS — E039 Hypothyroidism, unspecified: Secondary | ICD-10-CM | POA: Diagnosis not present

## 2018-01-03 DIAGNOSIS — E039 Hypothyroidism, unspecified: Secondary | ICD-10-CM | POA: Diagnosis not present

## 2018-01-03 DIAGNOSIS — M810 Age-related osteoporosis without current pathological fracture: Secondary | ICD-10-CM | POA: Diagnosis not present

## 2018-01-11 DIAGNOSIS — M8588 Other specified disorders of bone density and structure, other site: Secondary | ICD-10-CM | POA: Diagnosis not present

## 2018-01-18 ENCOUNTER — Ambulatory Visit: Payer: Self-pay | Admitting: Family Medicine

## 2018-01-18 NOTE — Telephone Encounter (Signed)
Possible drug interaction- patient has been having some SOB- she believes she has interactions between her BP medications and her thyroid medication. Patient has been on these medications for years- she has had changes in her thyroid dosage over time- but the BP medications have been stable. Patient states she had the pharmacist print out the side effects of her medications and fatigue and SOB are listed. Patient states that her dosage of synthroid is 50 mcg now and she has had a decrease in the SOB she has. It is not as severe.  Patient wants to cut her dosage of amlodipine in half. Advised patient against that at this time as she has not been checking her BP and the symptoms she is having could very well be from elevated BP. Patient agrees to check her BP at various times and bring her readings in- she has requested an appointment with C Wicker. Appointment scheduled- 30 minutes given due to patient's concerns.  Reason for Disposition . Caller has NON-URGENT medication question about med that PCP prescribed and triager unable to answer question  Answer Assessment - Initial Assessment Questions 1. SYMPTOMS: "Do you have any symptoms?"     Chronic SOB 2. SEVERITY: If symptoms are present, ask "Are they mild, moderate or severe?"     Symptoms are better with decrease in dosage of thyroid medication  Protocols used: MEDICATION QUESTION CALL-A-AH

## 2018-01-20 ENCOUNTER — Encounter: Payer: Self-pay | Admitting: Unknown Physician Specialty

## 2018-01-20 ENCOUNTER — Ambulatory Visit (INDEPENDENT_AMBULATORY_CARE_PROVIDER_SITE_OTHER): Payer: Medicare Other | Admitting: Unknown Physician Specialty

## 2018-01-20 DIAGNOSIS — R5382 Chronic fatigue, unspecified: Secondary | ICD-10-CM

## 2018-01-20 DIAGNOSIS — E7849 Other hyperlipidemia: Secondary | ICD-10-CM

## 2018-01-20 DIAGNOSIS — I10 Essential (primary) hypertension: Secondary | ICD-10-CM | POA: Diagnosis not present

## 2018-01-20 DIAGNOSIS — R5383 Other fatigue: Secondary | ICD-10-CM | POA: Insufficient documentation

## 2018-01-20 NOTE — Progress Notes (Signed)
BP 134/74 (BP Location: Left Arm, Cuff Size: Normal)   Pulse 71   Temp (!) 97.4 F (36.3 C) (Oral)   Ht 5\' 4"  (1.626 m)   Wt 157 lb 6.4 oz (71.4 kg)   LMP  (LMP Unknown)   SpO2 98%   BMI 27.02 kg/m    Subjective:    Patient ID: Julia Deleon, female    DOB: 1946-01-04, 72 y.o.   MRN: [id number]  HPI: Julia Deleon is a 72 y.o. female  Chief Complaint  Patient presents with  . Medication Problem    see telephone encounter in chart, pt states she thinks her BP medication or thyroid medication is causing a little SOB   SOB Pt states she is struggling with SOB and extreme fatigue.  She suspected it was due to stress but notes it is getting worse, and particularly since February.  She had a heart cath, lungs have been checked out, and now with an endocrinologist.  States that one day she did not take her medications and felt so much better that day.  She worries that her Levothyroxine, Amlodipine, or Losartan is causing her symptoms.    She stopped her thyroid medication at the beginning of the month.  Sister has had adrenal fatigue and had mercury poisoning.  Her Endocrinologist has restarted her.  She wonders if her BP meds can be decreased.    Stopped Crestor  Relevant past medical, surgical, family and social history reviewed and updated as indicated. Interim medical history since our last visit reviewed. Allergies and medications reviewed and updated.  Review of Systems  Per HPI unless specifically indicated above     Objective:    BP 134/74 (BP Location: Left Arm, Cuff Size: Normal)   Pulse 71   Temp (!) 97.4 F (36.3 C) (Oral)   Ht 5\' 4"  (1.626 m)   Wt 157 lb 6.4 oz (71.4 kg)   LMP  (LMP Unknown)   SpO2 98%   BMI 27.02 kg/m   Wt Readings from Last 3 Encounters:  01/20/18 157 lb 6.4 oz (71.4 kg)  12/06/17 155 lb (70.3 kg)  11/23/17 155 lb (70.3 kg)    Physical Exam  Constitutional: She is oriented to person, place, and time. She appears well-developed and  well-nourished. No distress.  HENT:  Head: Normocephalic and atraumatic.  Eyes: Conjunctivae and lids are normal. Right eye exhibits no discharge. Left eye exhibits no discharge. No scleral icterus.  Neck: Normal range of motion. Neck supple. No JVD present. Carotid bruit is not present.  Cardiovascular: Normal rate, regular rhythm and normal heart sounds.  Pulmonary/Chest: Effort normal and breath sounds normal.  Abdominal: Normal appearance. There is no splenomegaly or hepatomegaly.  Musculoskeletal: Normal range of motion.  Neurological: She is alert and oriented to person, place, and time.  Skin: Skin is warm, dry and intact. No rash noted. No pallor.  Psychiatric: She has a normal mood and affect. Her behavior is normal. Judgment and thought content normal.    Results for orders placed or performed in visit on 10/17/17  LP+ALT+AST Piccolo, [employer]  Result Value Ref Range   ALT (SGPT) Piccolo, Waived 26 10 - 47 U/L   AST (SGOT) Piccolo, Waived 25 11 - 38 U/L   Cholesterol Piccolo, Waived 213 (H) <200 mg/dL   HDL Chol Piccolo, Waived 44 (L) >59 mg/dL   Triglycerides Piccolo,Waived 110 <150 mg/dL   Chol/HDL Ratio Piccolo,Waive 4.9 mg/dL   LDL Chol Calc [employer]  147 (H) <100 mg/dL   VLDL Chol Calc Piccolo,Waive 22 <30 mg/dL  CBC with Differential/Platelet  Result Value Ref Range   WBC 6.6 3.4 - 10.8 x10E3/uL   RBC 4.33 3.77 - 5.28 x10E6/uL   Hemoglobin 13.2 11.1 - 15.9 g/dL   Hematocrit 38.6 34.0 - 46.6 %   MCV 89 79 - 97 fL   MCH 30.5 26.6 - 33.0 pg   MCHC 34.2 31.5 - 35.7 g/dL   RDW 14.0 12.3 - 15.4 %   Platelets 188 150 - 450 x10E3/uL   Neutrophils 58 Not Estab. %   Lymphs 32 Not Estab. %   Monocytes 6 Not Estab. %   Eos 2 Not Estab. %   Basos 1 Not Estab. %   Neutrophils Absolute 3.8 1.4 - 7.0 x10E3/uL   Lymphocytes Absolute 2.1 0.7 - 3.1 x10E3/uL   Monocytes Absolute 0.4 0.1 - 0.9 x10E3/uL   EOS (ABSOLUTE) 0.2 0.0 - 0.4 x10E3/uL   Basophils Absolute 0.0 0.0 -  0.2 x10E3/uL   Immature Granulocytes 1 Not Estab. %   Immature Grans (Abs) 0.0 0.0 - 0.1 x10E3/uL  TSH  Result Value Ref Range   TSH 3.210 0.450 - 4.500 uIU/mL      Assessment & Plan:   Problem List Items Addressed This Visit      Unprioritized   Essential (primary) hypertension    Will monitor BP and restart if it is greater than 140/90      Familial multiple lipoprotein-type hyperlipidemia    Statin intolerant.  Not taking Crestor.  Will DC.  Taking multiple supplements.  No evidence for fish oil benefits and will DC.  Other polypharmacy and recommended stopping Ca and MVI.  Start B12 supplement      Fatigue    Persistent.  Wonders if her medication is a problem.  Discussed trial of stopping Amlodipine.  If BP is above 140/90 consistently will restart.  Continue Losartan for now.           Pt with questions about adrenal fatigue.  Will talk to Endocrinology and talk to sister who has it.    Follow up plan: F/U with Dr. Jeananne Rama mid-October

## 2018-01-20 NOTE — Assessment & Plan Note (Signed)
Persistent.  Wonders if her medication is a problem.  Discussed trial of stopping Amlodipine.  If BP is above 140/90 consistently will restart.  Continue Losartan for now.

## 2018-01-20 NOTE — Assessment & Plan Note (Signed)
Will monitor BP and restart if it is greater than 140/90

## 2018-01-20 NOTE — Assessment & Plan Note (Addendum)
Statin intolerant.  Not taking Crestor.  Will DC.  Taking multiple supplements.  No evidence for fish oil benefits and will DC.  Other polypharmacy and recommended stopping Ca and MVI.  Start B12 supplement

## 2018-01-30 ENCOUNTER — Telehealth: Payer: Self-pay | Admitting: Unknown Physician Specialty

## 2018-01-30 NOTE — Telephone Encounter (Signed)
R/e salivary Cortisol testing she dropped off.  High results.  Recommended a Cortisol Stim test through her Endocrinologist.

## 2018-02-05 NOTE — Telephone Encounter (Signed)
Call pt 

## 2018-02-06 NOTE — Telephone Encounter (Signed)
Patient was transferred to provider for telephone conversation.   

## 2018-02-15 DIAGNOSIS — R5382 Chronic fatigue, unspecified: Secondary | ICD-10-CM | POA: Diagnosis not present

## 2018-02-15 DIAGNOSIS — N951 Menopausal and female climacteric states: Secondary | ICD-10-CM | POA: Diagnosis not present

## 2018-02-15 DIAGNOSIS — E039 Hypothyroidism, unspecified: Secondary | ICD-10-CM | POA: Diagnosis not present

## 2018-02-15 DIAGNOSIS — I10 Essential (primary) hypertension: Secondary | ICD-10-CM | POA: Diagnosis not present

## 2018-02-15 DIAGNOSIS — R0602 Shortness of breath: Secondary | ICD-10-CM | POA: Diagnosis not present

## 2018-02-15 DIAGNOSIS — Z6826 Body mass index (BMI) 26.0-26.9, adult: Secondary | ICD-10-CM | POA: Diagnosis not present

## 2018-02-15 DIAGNOSIS — J309 Allergic rhinitis, unspecified: Secondary | ICD-10-CM | POA: Diagnosis not present

## 2018-02-23 ENCOUNTER — Encounter: Payer: Self-pay | Admitting: Family Medicine

## 2018-02-23 ENCOUNTER — Ambulatory Visit (INDEPENDENT_AMBULATORY_CARE_PROVIDER_SITE_OTHER): Payer: Medicare Other | Admitting: Family Medicine

## 2018-02-23 ENCOUNTER — Encounter

## 2018-02-23 DIAGNOSIS — I2583 Coronary atherosclerosis due to lipid rich plaque: Secondary | ICD-10-CM

## 2018-02-23 DIAGNOSIS — I10 Essential (primary) hypertension: Secondary | ICD-10-CM | POA: Diagnosis not present

## 2018-02-23 DIAGNOSIS — E038 Other specified hypothyroidism: Secondary | ICD-10-CM

## 2018-02-23 DIAGNOSIS — I2510 Atherosclerotic heart disease of native coronary artery without angina pectoris: Secondary | ICD-10-CM | POA: Diagnosis not present

## 2018-02-23 DIAGNOSIS — E785 Hyperlipidemia, unspecified: Secondary | ICD-10-CM | POA: Diagnosis not present

## 2018-02-23 DIAGNOSIS — J449 Chronic obstructive pulmonary disease, unspecified: Secondary | ICD-10-CM

## 2018-02-23 DIAGNOSIS — R5382 Chronic fatigue, unspecified: Secondary | ICD-10-CM | POA: Diagnosis not present

## 2018-02-23 NOTE — Assessment & Plan Note (Signed)
Discussed extensively fatigue with patient and possibility of depression with replacement therapy of medications such as Prozac.  Patient reluctant to consider other medications at this point.

## 2018-02-23 NOTE — Assessment & Plan Note (Signed)
No symptoms patient currently refusing statins discussed alternative treatments patient will consider.

## 2018-02-23 NOTE — Assessment & Plan Note (Signed)
No inhalers and stable.  Pulmonary is not recommending any treatment at this time.

## 2018-02-23 NOTE — Progress Notes (Signed)
BP (!) 156/73 (BP Location: Left Arm, Patient Position: Sitting, Cuff Size: Normal)   Pulse 75   Temp (!) 97.2 F (36.2 C)   Wt 156 lb (70.8 kg)   LMP  (LMP Unknown)   SpO2 100%   BMI 26.78 kg/m    Subjective:    Patient ID: Julia Deleon, female    DOB: 04-14-1946, 72 y.o.   MRN: [id number]  HPI: Julia Deleon is a 72 y.o. female  Patient follow-up fatigue. Patient with continued fatigue is had extensive work-ups both through cardiology which is negative but does show 50% blockage of coronary artery. Patient also has seen endocrinology and has been reassured about taking her thyroid medications.  And has a follow-up appointment in a few weeks. Patient still bothered by tremendous fatigue and is concerned about possibility of drug interactions causing this fatigue. Patient still under a great deal of stress with multiple family issues going on but has resolved some of her stress by buying out her sister is in the estate of her parents.  Relevant past medical, surgical, family and social history reviewed and updated as indicated. Interim medical history since our last visit reviewed. Allergies and medications reviewed and updated.  Review of Systems  Constitutional: Negative.   Respiratory: Negative.   Cardiovascular: Negative.     Per HPI unless specifically indicated above     Objective:    BP (!) 156/73 (BP Location: Left Arm, Patient Position: Sitting, Cuff Size: Normal)   Pulse 75   Temp (!) 97.2 F (36.2 C)   Wt 156 lb (70.8 kg)   LMP  (LMP Unknown)   SpO2 100%   BMI 26.78 kg/m   Wt Readings from Last 3 Encounters:  02/23/18 156 lb (70.8 kg)  01/20/18 157 lb 6.4 oz (71.4 kg)  12/06/17 155 lb (70.3 kg)    Physical Exam  Constitutional: She is oriented to person, place, and time. She appears well-developed and well-nourished.  HENT:  Head: Normocephalic and atraumatic.  Eyes: Conjunctivae and EOM are normal.  Neck: Normal range of motion.  Cardiovascular:  Normal rate, regular rhythm and normal heart sounds.  Pulmonary/Chest: Effort normal and breath sounds normal.  Musculoskeletal: Normal range of motion.  Neurological: She is alert and oriented to person, place, and time.  Skin: No erythema.  Psychiatric: She has a normal mood and affect. Her behavior is normal. Judgment and thought content normal.    Results for orders placed or performed in visit on 10/17/17  LP+ALT+AST Piccolo, [employer]  Result Value Ref Range   ALT (SGPT) Piccolo, Waived 26 10 - 47 U/L   AST (SGOT) Piccolo, Waived 25 11 - 38 U/L   Cholesterol Piccolo, Waived 213 (H) <200 mg/dL   HDL Chol Piccolo, Waived 44 (L) >59 mg/dL   Triglycerides Piccolo,Waived 110 <150 mg/dL   Chol/HDL Ratio Piccolo,Waive 4.9 mg/dL   LDL Chol Calc Piccolo Waived 147 (H) <100 mg/dL   VLDL Chol Calc Piccolo,Waive 22 <30 mg/dL  CBC with Differential/Platelet  Result Value Ref Range   WBC 6.6 3.4 - 10.8 x10E3/uL   RBC 4.33 3.77 - 5.28 x10E6/uL   Hemoglobin 13.2 11.1 - 15.9 g/dL   Hematocrit 38.6 34.0 - 46.6 %   MCV 89 79 - 97 fL   MCH 30.5 26.6 - 33.0 pg   MCHC 34.2 31.5 - 35.7 g/dL   RDW 14.0 12.3 - 15.4 %   Platelets 188 150 - 450 x10E3/uL   Neutrophils 58 Not  Estab. %   Lymphs 32 Not Estab. %   Monocytes 6 Not Estab. %   Eos 2 Not Estab. %   Basos 1 Not Estab. %   Neutrophils Absolute 3.8 1.4 - 7.0 x10E3/uL   Lymphocytes Absolute 2.1 0.7 - 3.1 x10E3/uL   Monocytes Absolute 0.4 0.1 - 0.9 x10E3/uL   EOS (ABSOLUTE) 0.2 0.0 - 0.4 x10E3/uL   Basophils Absolute 0.0 0.0 - 0.2 x10E3/uL   Immature Granulocytes 1 Not Estab. %   Immature Grans (Abs) 0.0 0.0 - 0.1 x10E3/uL  TSH  Result Value Ref Range   TSH 3.210 0.450 - 4.500 uIU/mL      Assessment & Plan:   Problem List Items Addressed This Visit      Cardiovascular and Mediastinum   Essential (primary) hypertension    Discussed need for better control patient partially convinced that amlodipine is 1 of her culprit medications is  causing fatigue patient's been off amlodipine for 2 to 3 weeks has not really noticed that much difference has not done a rechallenge discuss re-challenging with amlodipine and observing symptoms.      CAD (coronary artery disease)    No symptoms patient currently refusing statins discussed alternative treatments patient will consider.        Respiratory   COPD (chronic obstructive pulmonary disease) (HCC)    No inhalers and stable.  Pulmonary is not recommending any treatment at this time.        Endocrine   Hypothyroidism    Is back on levothyroxine 50 mcg has follow-up appointment with endocrinology in a few weeks.        Other   Hyperlipidemia    Discussed elevated cholesterol with LDL of 147 back this summer and coronary artery disease and progression.  Discussed need to get LDL less than 70.  Discussed use of statins which patient is very adamant about not using even at micro doses.  Discussed use of niacin as was mentioned by cardiologist patient very reluctant.      Fatigue    Discussed extensively fatigue with patient and possibility of depression with replacement therapy of medications such as Prozac.  Patient reluctant to consider other medications at this point.          Follow up plan: Return in about 4 weeks (around 03/23/2018) for Physical Exam.

## 2018-02-23 NOTE — Assessment & Plan Note (Signed)
Is back on levothyroxine 50 mcg has follow-up appointment with endocrinology in a few weeks.

## 2018-02-23 NOTE — Assessment & Plan Note (Signed)
Discussed need for better control patient partially convinced that amlodipine is 1 of her culprit medications is causing fatigue patient's been off amlodipine for 2 to 3 weeks has not really noticed that much difference has not done a rechallenge discuss re-challenging with amlodipine and observing symptoms.

## 2018-02-23 NOTE — Assessment & Plan Note (Signed)
Discussed elevated cholesterol with LDL of 147 back this summer and coronary artery disease and progression.  Discussed need to get LDL less than 70.  Discussed use of statins which patient is very adamant about not using even at micro doses.  Discussed use of niacin as was mentioned by cardiologist patient very reluctant.

## 2018-03-09 DIAGNOSIS — M8589 Other specified disorders of bone density and structure, multiple sites: Secondary | ICD-10-CM | POA: Diagnosis not present

## 2018-03-09 DIAGNOSIS — E039 Hypothyroidism, unspecified: Secondary | ICD-10-CM | POA: Diagnosis not present

## 2018-03-09 DIAGNOSIS — R5383 Other fatigue: Secondary | ICD-10-CM | POA: Diagnosis not present

## 2018-03-10 DIAGNOSIS — E039 Hypothyroidism, unspecified: Secondary | ICD-10-CM | POA: Diagnosis not present

## 2018-03-10 DIAGNOSIS — R5383 Other fatigue: Secondary | ICD-10-CM | POA: Diagnosis not present

## 2018-03-13 DIAGNOSIS — I10 Essential (primary) hypertension: Secondary | ICD-10-CM | POA: Diagnosis not present

## 2018-03-13 DIAGNOSIS — N183 Chronic kidney disease, stage 3 (moderate): Secondary | ICD-10-CM | POA: Diagnosis not present

## 2018-03-13 DIAGNOSIS — R600 Localized edema: Secondary | ICD-10-CM | POA: Diagnosis not present

## 2018-03-13 LAB — BASIC METABOLIC PANEL
BUN: 26 — AB (ref 4–21)
Creatinine: 1.1 (ref 0.5–1.1)
Glucose: 89
Potassium: 4.5 (ref 3.4–5.3)
Sodium: 137 (ref 137–147)

## 2018-03-13 LAB — CBC AND DIFFERENTIAL
HCT: 41 (ref 36–46)
Hemoglobin: 13.8 (ref 12.0–16.0)
Platelets: 224 (ref 150–399)
WBC: 6.8

## 2018-03-13 LAB — HEPATIC FUNCTION PANEL
ALT: 19 (ref 7–35)
AST: 17 (ref 13–35)
Alkaline Phosphatase: 50 (ref 25–125)
Bilirubin, Total: 0.6

## 2018-03-29 DIAGNOSIS — J302 Other seasonal allergic rhinitis: Secondary | ICD-10-CM | POA: Diagnosis not present

## 2018-03-29 DIAGNOSIS — R519 Headache, unspecified: Secondary | ICD-10-CM | POA: Insufficient documentation

## 2018-03-29 DIAGNOSIS — G4489 Other headache syndrome: Secondary | ICD-10-CM | POA: Insufficient documentation

## 2018-03-30 ENCOUNTER — Ambulatory Visit (INDEPENDENT_AMBULATORY_CARE_PROVIDER_SITE_OTHER): Payer: Medicare Other | Admitting: Family Medicine

## 2018-03-30 ENCOUNTER — Encounter: Payer: Self-pay | Admitting: Family Medicine

## 2018-03-30 ENCOUNTER — Ambulatory Visit (INDEPENDENT_AMBULATORY_CARE_PROVIDER_SITE_OTHER): Payer: Medicare Other

## 2018-03-30 VITALS — BP 146/70 | HR 60 | Temp 96.7°F | Ht 64.0 in | Wt 158.0 lb

## 2018-03-30 VITALS — BP 146/76 | HR 60 | Temp 96.7°F | Ht 63.78 in | Wt 158.1 lb

## 2018-03-30 DIAGNOSIS — Z7189 Other specified counseling: Secondary | ICD-10-CM | POA: Diagnosis not present

## 2018-03-30 DIAGNOSIS — E038 Other specified hypothyroidism: Secondary | ICD-10-CM | POA: Diagnosis not present

## 2018-03-30 DIAGNOSIS — Z0000 Encounter for general adult medical examination without abnormal findings: Secondary | ICD-10-CM | POA: Diagnosis not present

## 2018-03-30 DIAGNOSIS — E785 Hyperlipidemia, unspecified: Secondary | ICD-10-CM | POA: Diagnosis not present

## 2018-03-30 DIAGNOSIS — I83893 Varicose veins of bilateral lower extremities with other complications: Secondary | ICD-10-CM | POA: Diagnosis not present

## 2018-03-30 DIAGNOSIS — R0602 Shortness of breath: Secondary | ICD-10-CM | POA: Diagnosis not present

## 2018-03-30 DIAGNOSIS — I10 Essential (primary) hypertension: Secondary | ICD-10-CM

## 2018-03-30 DIAGNOSIS — I2510 Atherosclerotic heart disease of native coronary artery without angina pectoris: Secondary | ICD-10-CM | POA: Diagnosis not present

## 2018-03-30 DIAGNOSIS — I2583 Coronary atherosclerosis due to lipid rich plaque: Secondary | ICD-10-CM

## 2018-03-30 MED ORDER — ZOSTER VAC RECOMB ADJUVANTED 50 MCG/0.5ML IM SUSR: 0.5000 mL | Freq: Once | INTRAMUSCULAR | 1 refills | Status: AC

## 2018-03-30 NOTE — Assessment & Plan Note (Signed)
Continues to use compression

## 2018-03-30 NOTE — Assessment & Plan Note (Signed)
A voluntary discussion about advanced care planning including explanation and discussion of advanced directives was extentively discussed with the patient.  Explained about the healthcare proxy and living will was reviewed and packet with forms with expiration of how to fill them out was given.  Time spent: Encounter 16+ min individuals present: Patient 

## 2018-03-30 NOTE — Assessment & Plan Note (Signed)
Discuss hypertension blood pressure good at other doctor's offices will observe for now

## 2018-03-30 NOTE — Progress Notes (Addendum)
Subjective:   Julia Deleon is a 72 y.o. female who presents for Medicare Annual (Subsequent) preventive examination.     Objective:     Vitals: BP (!) 146/70 (BP Location: Left Arm, Patient Position: Sitting)   Pulse 60   Temp (!) 96.7 F (35.9 C) (Oral)   Ht 5\' 4"  (1.626 m)   Wt 158 lb (71.7 kg)   LMP  (LMP Unknown)   BMI 27.12 kg/m   Body mass index is 27.12 kg/m.  Advanced Directives 03/30/2018 12/06/2017 03/28/2017  Does Patient Have a Medical Advance Directive? No No Yes  Does patient want to make changes to medical advance directive? - - Yes (MAU/Ambulatory/Procedural Areas - Information given)  Would patient like information on creating a medical advance directive? Yes (MAU/Ambulatory/Procedural Areas - Information given) No - Patient declined -    Tobacco Social History   Tobacco Use  Smoking Status Never Smoker  Smokeless Tobacco Never Used     Counseling given: Not Answered   Clinical Intake:  Pre-visit preparation completed: No  Pain : No/denies pain     Diabetes: No  How often do you need to have someone help you when you read instructions, pamphlets, or other written materials from your doctor or pharmacy?: 1 - Never What is the last grade level you completed in school?: Bachelors  Interpreter Needed?: No  Information entered by :: Tyson Dense, RN  Past Medical History:  Diagnosis Date  . COPD (chronic obstructive pulmonary disease) ([place])   . Fuch's endothelial dystrophy   . Hyperlipidemia    Past Surgical History:  Procedure Laterality Date  . APPENDECTOMY    . CHOLECYSTECTOMY    . EYE SURGERY    . FOOT SURGERY Right    pin removed left  . laser vein surgery    . NASAL SINUS SURGERY    . RIGHT/LEFT HEART CATH AND CORONARY ANGIOGRAPHY N/A 12/06/2017   Procedure: RIGHT/LEFT HEART CATH AND CORONARY ANGIOGRAPHY;  Surgeon: Yolonda Kida, MD;  Location: [facility];  Service: Cardiovascular;  Laterality: N/A;  .  TONSILLECTOMY     Family History  Problem Relation Age of Onset  . Cancer Mother        lung  . CAD Mother   . Stroke Father   . Cancer Sister        breast  . Breast cancer Sister 47  . Varicose Veins Son   . Heart disease Maternal Grandmother   . Cancer Paternal Grandmother        throat  . Emphysema Paternal Grandfather   . Fibromyalgia Sister   . Breast cancer Paternal Aunt    Social History   Socioeconomic History  . Marital status: Married    Spouse name: Not on file  . Number of children: Not on file  . Years of education: 4  . Highest education level: Bachelor's degree (e.g., BA, AB, BS)  Occupational History  . Not on file  Social Needs  . Financial resource strain: Not hard at all  . Food insecurity:    Worry: Never true    Inability: Never true  . Transportation needs:    Medical: No    Non-medical: No  Tobacco Use  . Smoking status: Never Smoker  . Smokeless tobacco: Never Used  Substance and Sexual Activity  . Alcohol use: No    Alcohol/week: 0.0 standard drinks  . Drug use: No  . Sexual activity: Yes    Birth control/protection: Post-menopausal  Lifestyle  . Physical activity:    Days per week: 2 days    Minutes per session: 60 min  . Stress: Only a little  Relationships  . Social connections:    Talks on phone: More than three times a week    Gets together: Once a week    Attends religious service: More than 4 times per year    Active member of club or organization: No    Attends meetings of clubs or organizations: Never    Relationship status: Married  Other Topics Concern  . Not on file  Social History Narrative  . Not on file    Outpatient Encounter Medications as of 03/30/2018  Medication Sig  . acetaminophen (TYLENOL) 650 MG CR tablet Take 650 mg by mouth 2 (two) times daily.  . Azelastine-Fluticasone (DYMISTA) 137-50 MCG/ACT SUSP Place 1 spray 2 (two) times daily into the nose. (Patient taking differently: Place 1 spray into the  nose daily. )  . Calcium-Vitamin D-Vitamin K (VIACTIV PO) Take 1 tablet by mouth 2 (two) times daily.  . chlorzoxazone (PARAFON) 500 MG tablet Take 1 tablet (500 mg total) by mouth 4 (four) times daily as needed for muscle spasms.  . Cholecalciferol (VITAMIN D3) 2000 units TABS Take 2,000 Units by mouth daily.  . clotrimazole (GYNE-LOTRIMIN) 1 % vaginal cream Place 1 Applicatorful vaginally at bedtime.  Marland Kitchen estradiol (ESTRACE VAGINAL) 0.1 MG/GM vaginal cream 0.5 twice weekly --COMPOUND PLS (Patient taking differently: Place 1 Applicatorful vaginally 3 (three) times a week. 0.5 twice weekly --COMPOUND PLS)  . Glucosamine-Chondroitin (OSTEO BI-FLEX REGULAR STRENGTH PO) Take 1 tablet by mouth 2 (two) times daily.  Marland Kitchen ibuprofen (ADVIL,MOTRIN) 200 MG tablet Take 200 mg by mouth every 6 (six) hours as needed.  Marland Kitchen levothyroxine (SYNTHROID, LEVOTHROID) 50 MCG tablet Take 75 mcg by mouth daily.   Marland Kitchen losartan (COZAAR) 100 MG tablet Take 1 tablet (100 mg total) daily by mouth.  . Progesterone Micronized (PROGESTERONE, BULK,) POWD 4% compound- topically  . triamcinolone (NASACORT ALLERGY 24HR) 55 MCG/ACT AERO nasal inhaler Place 2 sprays daily into the nose. (Patient taking differently: Place 2 sprays into the nose daily as needed. )  . vitamin E 400 UNIT capsule Take 400 Units by mouth daily.  Marland Kitchen Zoster Vaccine Adjuvanted [facility]) injection Inject 0.5 mLs into the muscle once for 1 dose.  . [DISCONTINUED] Zoster Vaccine Adjuvanted [facility]) injection Inject 0.5 mLs into the muscle once.   No facility-administered encounter medications on file as of 03/30/2018.     Activities of Daily Living In your present state of health, do you have any difficulty performing the following activities: 03/30/2018 03/30/2018  Hearing? N N  Vision? N N  Difficulty concentrating or making decisions? N N  Walking or climbing stairs? N Y  Dressing or bathing? N N  Doing errands, shopping? N N  Preparing Food and eating ? N -   Using the Toilet? N -  In the past six months, have you accidently leaked urine? Y -  Do you have problems with loss of bowel control? N -  Managing your Medications? N -  Managing your Finances? N -  Housekeeping or managing your Housekeeping? N -  Some recent data might be hidden    Patient Care Team: Guadalupe Maple, MD as PCP - General (Family Medicine)    Assessment:   This is a routine wellness examination for Mauriana.  Exercise Activities and Dietary recommendations Current Exercise Habits: Home exercise routine, Type of  exercise: strength training/weights, Time (Minutes): 60, Frequency (Times/Week): 2, Weekly Exercise (Minutes/Week): 120, Intensity: Mild, Exercise limited by: None identified  Goals    . DIET - INCREASE WATER INTAKE     Recommend drinking at least 5-6 glasses of water a day        Fall Risk Fall Risk  03/30/2018 11/23/2017 10/17/2017 06/06/2017 03/28/2017  Falls in the past year? 1 Yes Yes No No  Number falls in past yr: 1 1 1  - -  Injury with Fall? 0 No No - -  Follow up - - Falls evaluation completed - -   Is the patient's home free of loose throw rugs in walkways, pet beds, electrical cords, etc?   yes      Grab bars in the bathroom? yes      Handrails on the stairs?   yes      Adequate lighting?   yes  Depression Screen PHQ 2/9 Scores 03/30/2018 06/06/2017 03/28/2017 03/28/2017  PHQ - 2 Score 0 1 1 1   PHQ- 9 Score - - 3 2     Cognitive Function     6CIT Screen 03/30/2018 03/28/2017  What Year? 0 points 0 points  What month? 0 points 0 points  What time? 0 points 0 points  Count back from 20 0 points 0 points  Months in reverse 0 points 0 points  Repeat phrase 0 points 0 points  Total Score 0 0    Immunization History  Administered Date(s) Administered  . Influenza, High Dose Seasonal PF 03/25/2016  . Influenza,inj,Quad PF,6+ Mos 03/25/2015  . Pneumococcal Conjugate-13 02/13/2014  . Pneumococcal Polysaccharide-23 12/09/2016  . Tdap  07/21/2010  . Zoster 05/07/2009    Qualifies for Shingles Vaccine?due, ordered to pharmacy  Screening Tests Health Maintenance  Topic Date Due  . INFLUENZA VACCINE  04/13/2018 (Originally 12/08/2017)  . MAMMOGRAM  09/28/2019  . TETANUS/TDAP  07/20/2020  . COLONOSCOPY  11/09/2021  . DEXA SCAN  Completed  . Hepatitis C Screening  Completed  . PNA vac Low Risk Adult  Completed    Cancer Screenings: Lung: Low Dose CT Chest recommended if Age 53-80 years, 30 pack-year currently smoking OR have quit w/in 15years. Patient does not qualify. Breast:  Up to date on Mammogram? Yes   Up to date of Bone Density/Dexa? Yes Colorectal: up to date  Additional Screenings:  Hepatitis C Screening: declined Flu shot due: wants to wait until after thanksgiving    Plan:    I have personally reviewed and addressed the Medicare Annual Wellness questionnaire and have noted the following in the patient's chart:  A. Medical and social history B. Use of alcohol, tobacco or illicit drugs  C. Current medications and supplements D. Functional ability and status E.  Nutritional status F.  Physical activity G. Advance directives H. List of other physicians I.  Hospitalizations, surgeries, and ER visits in previous 12 months J.  [place] to include hearing, vision, cognitive, depression L. Referrals and appointments - none  In addition, I have reviewed and discussed with patient certain preventive protocols, quality metrics, and best practice recommendations. A written personalized care plan for preventive services as well as general preventive health recommendations were provided to patient.  See attached scanned questionnaire for additional information.   Signed,   Tyson Dense, RN Nurse Health Advisor  Patient Concerns: None

## 2018-03-30 NOTE — Progress Notes (Addendum)
BP (!) 146/76 (BP Location: Left Arm, Patient Position: Sitting, Cuff Size: Normal)   Pulse 60   Temp (!) 96.7 F (35.9 C)   Ht 5' 3.78" (1.62 m)   Wt 158 lb 2 oz (71.7 kg)   LMP  (LMP Unknown)   SpO2 100%   BMI 27.33 kg/m    Subjective:    Patient ID: Julia Deleon, female    DOB: 1946-01-25, 72 y.o.   MRN: [id number]  HPI: Julia Deleon is a 72 y.o. female  Chief Complaint  Patient presents with  . Annual Exam  Patient all in all still has chronic fatigue type symptoms but remains under a great deal of stress closing up her dad's estate. Patient's been to endocrinology and allergy this last month and reports were reviewed. Endocrinology increased thyroid medication as patient was undermedicated which the patient has done. Allergy stopped the D products changed antihistamines which the patient has done. Patient is also been to endocrinology with good blood pressure readings and renal function with slight decline with GFR down to 51. Labs reviewed and all labs have been done except for lipid panel. We will do selenium because of patient's ongoing fatigue symptoms. Reviewed nephrology notes and talks about 1 cause of fatigue would be deconditioning and discuss exercise programs.   Relevant past medical, surgical, family and social history reviewed and updated as indicated. Interim medical history since our last visit reviewed. Allergies and medications reviewed and updated.  Review of Systems  Constitutional: Negative.   HENT: Negative.   Eyes: Negative.   Respiratory: Positive for shortness of breath.   Cardiovascular: Negative.   Gastrointestinal: Negative.   Endocrine: Negative.   Genitourinary: Negative.   Musculoskeletal: Negative.   Skin: Negative.   Allergic/Immunologic: Negative.   Neurological: Negative.   Hematological: Negative.   Psychiatric/Behavioral: Negative.     Per HPI unless specifically indicated above     Objective:    BP (!) 146/76 (BP  Location: Left Arm, Patient Position: Sitting, Cuff Size: Normal)   Pulse 60   Temp (!) 96.7 F (35.9 C)   Ht 5' 3.78" (1.62 m)   Wt 158 lb 2 oz (71.7 kg)   LMP  (LMP Unknown)   SpO2 100%   BMI 27.33 kg/m   Wt Readings from Last 3 Encounters:  03/30/18 158 lb (71.7 kg)  03/30/18 158 lb 2 oz (71.7 kg)  02/23/18 156 lb (70.8 kg)    Physical Exam  Constitutional: She is oriented to person, place, and time. She appears well-developed and well-nourished.  HENT:  Head: Normocephalic and atraumatic.  Right Ear: External ear normal.  Left Ear: External ear normal.  Nose: Nose normal.  Mouth/Throat: Oropharynx is clear and moist.  Eyes: Pupils are equal, round, and reactive to light. Conjunctivae and EOM are normal.  Neck: Normal range of motion. Neck supple. Carotid bruit is not present.  Cardiovascular: Normal rate, regular rhythm and normal heart sounds.  No murmur heard. Pulmonary/Chest: Effort normal and breath sounds normal.  Abdominal: Soft. Bowel sounds are normal. There is no hepatosplenomegaly.  Musculoskeletal: Normal range of motion.  Neurological: She is alert and oriented to person, place, and time.  Skin: No rash noted.  Psychiatric: She has a normal mood and affect. Her behavior is normal. Judgment and thought content normal.    Results for orders placed or performed in visit on 03/29/18  CBC and differential  Result Value Ref Range   Hemoglobin 13.8 12.0 - 16.0  HCT 41 36 - 46   Platelets 224 150 - 399   WBC 6.8   Basic metabolic panel  Result Value Ref Range   Glucose 89    BUN 26 (A) 4 - 21   Creatinine 1.1 0.5 - 1.1   Potassium 4.5 3.4 - 5.3   Sodium 137 137 - 147  Hepatic function panel  Result Value Ref Range   Alkaline Phosphatase 50 25 - 125   ALT 19 7 - 35   AST 17 13 - 35   Bilirubin, Total 0.6       Assessment & Plan:   Problem List Items Addressed This Visit      Cardiovascular and Mediastinum   Essential (primary) hypertension     Discuss hypertension blood pressure good at other doctor's offices will observe for now      Varicose veins of both lower extremities with complications    Continues to use compression        Endocrine   Hypothyroidism - Primary    Just had dosage change.  Checking thyroid through endocrinology      Relevant Orders   Selenium serum     Other   Hyperlipidemia    The current medical regimen is effective;  continue present plan and medications.       Relevant Orders   Lipid panel   Advanced care planning/counseling discussion    A voluntary discussion about advanced care planning including explanation and discussion of advanced directives was extentively discussed with the patient.  Explained about the healthcare proxy and living will was reviewed and packet with forms with expiration of how to fill them out was given.  Time spent: Encounter 16+ min individuals present: Patient      Short of breath on exertion    Discussed shortness of breath and fatigue and discussed walking with patient as other doctors have also.          Follow up plan: Return in about 6 months (around 09/28/2018).

## 2018-03-30 NOTE — Assessment & Plan Note (Signed)
The current medical regimen is effective;  continue present plan and medications.  

## 2018-03-30 NOTE — Patient Instructions (Addendum)
Julia Deleon , Thank you for taking time to come for your Medicare Wellness Visit. I appreciate your ongoing commitment to your health goals. Please review the following plan we discussed and let me know if I can assist you in the future.   Screening recommendations/referrals: Colonoscopy up to date, due 11/09/2021 Mammogram up to date, due 09/28/2019 Bone Density up to date Recommended yearly ophthalmology/optometry visit for glaucoma screening and checkup Recommended yearly dental visit for hygiene and checkup  Vaccinations: Influenza vaccine due, please get as soon as you can Pneumococcal vaccine up to date, completed Tdap vaccine up to date, due 07/20/2020 Shingles vaccine due, ordered to pharmacy    Advanced directives: Please bring [place] a copy once this is completed  Conditions/risks identified: none  Next appointment: Medicare Wellness 04/02/2019 @ 9am   Preventive Care 11 Years and Older, Female Preventive care refers to lifestyle choices and visits with your health care provider that can promote health and wellness. What does preventive care include?  A yearly physical exam. This is also called an annual well check.  Dental exams once or twice a year.  Routine eye exams. Ask your health care provider how often you should have your eyes checked.  Personal lifestyle choices, including:  Daily care of your teeth and gums.  Regular physical activity.  Eating a healthy diet.  Avoiding tobacco and drug use.  Limiting alcohol use.  Practicing safe sex.  Taking low-dose aspirin every day.  Taking vitamin and mineral supplements as recommended by your health care provider. What happens during an annual well check? The services and screenings done by your health care provider during your annual well check will depend on your age, overall health, lifestyle risk factors, and family history of disease. Counseling  Your health care provider may ask you questions about  your:  Alcohol use.  Tobacco use.  Drug use.  Emotional well-being.  Home and relationship well-being.  Sexual activity.  Eating habits.  History of falls.  Memory and ability to understand (cognition).  Work and work Statistician.  Reproductive health. Screening  You may have the following tests or measurements:  Height, weight, and BMI.  Blood pressure.  Lipid and cholesterol levels. These may be checked every 5 years, or more frequently if you are over 84 years old.  Skin check.  Lung cancer screening. You may have this screening every year starting at age 40 if you have a 30-pack-year history of smoking and currently smoke or have quit within the past 15 years.  Fecal occult blood test (FOBT) of the stool. You may have this test every year starting at age 96.  Flexible sigmoidoscopy or colonoscopy. You may have a sigmoidoscopy every 5 years or a colonoscopy every 10 years starting at age 57.  Hepatitis C blood test.  Hepatitis B blood test.  Sexually transmitted disease (STD) testing.  Diabetes screening. This is done by checking your blood sugar (glucose) after you have not eaten for a while (fasting). You may have this done every 1-3 years.  Bone density scan. This is done to screen for osteoporosis. You may have this done starting at age 67.  Mammogram. This may be done every 1-2 years. Talk to your health care provider about how often you should have regular mammograms. Talk with your health care provider about your test results, treatment options, and if necessary, the need for more tests. Vaccines  Your health care provider may recommend certain vaccines, such as:  Influenza vaccine. This  is recommended every year.  Tetanus, diphtheria, and acellular pertussis (Tdap, Td) vaccine. You may need a Td booster every 10 years.  Zoster vaccine. You may need this after age 79.  Pneumococcal 13-valent conjugate (PCV13) vaccine. One dose is recommended  after age 27.  Pneumococcal polysaccharide (PPSV23) vaccine. One dose is recommended after age 67. Talk to your health care provider about which screenings and vaccines you need and how often you need them. This information is not intended to replace advice given to you by your health care provider. Make sure you discuss any questions you have with your health care provider. Document Released: 05/23/2015 Document Revised: 01/14/2016 Document Reviewed: 02/25/2015 Elsevier Interactive Patient Education  2017 [place] Prevention in the Home Falls can cause injuries. They can happen to people of all ages. There are many things you can do to make your home safe and to help prevent falls. What can I do on the outside of my home?  Regularly fix the edges of walkways and driveways and fix any cracks.  Remove anything that might make you trip as you walk through a door, such as a raised step or threshold.  Trim any bushes or trees on the path to your home.  Use bright outdoor lighting.  Clear any walking paths of anything that might make someone trip, such as rocks or tools.  Regularly check to see if handrails are loose or broken. Make sure that both sides of any steps have handrails.  Any raised decks and porches should have guardrails on the edges.  Have any leaves, snow, or ice cleared regularly.  Use sand or salt on walking paths during winter.  Clean up any spills in your garage right away. This includes oil or grease spills. What can I do in the bathroom?  Use night lights.  Install grab bars by the toilet and in the tub and shower. Do not use towel bars as grab bars.  Use non-skid mats or decals in the tub or shower.  If you need to sit down in the shower, use a plastic, non-slip stool.  Keep the floor dry. Clean up any water that spills on the floor as soon as it happens.  Remove soap buildup in the tub or shower regularly.  Attach bath mats securely with  double-sided non-slip rug tape.  Do not have throw rugs and other things on the floor that can make you trip. What can I do in the bedroom?  Use night lights.  Make sure that you have a light by your bed that is easy to reach.  Do not use any sheets or blankets that are too big for your bed. They should not hang down onto the floor.  Have a firm chair that has side arms. You can use this for support while you get dressed.  Do not have throw rugs and other things on the floor that can make you trip. What can I do in the kitchen?  Clean up any spills right away.  Avoid walking on wet floors.  Keep items that you use a lot in easy-to-reach places.  If you need to reach something above you, use a strong step stool that has a grab bar.  Keep electrical cords out of the way.  Do not use floor polish or wax that makes floors slippery. If you must use wax, use non-skid floor wax.  Do not have throw rugs and other things on the floor that can make you  trip. What can I do with my stairs?  Do not leave any items on the stairs.  Make sure that there are handrails on both sides of the stairs and use them. Fix handrails that are broken or loose. Make sure that handrails are as long as the stairways.  Check any carpeting to make sure that it is firmly attached to the stairs. Fix any carpet that is loose or worn.  Avoid having throw rugs at the top or bottom of the stairs. If you do have throw rugs, attach them to the floor with carpet tape.  Make sure that you have a light switch at the top of the stairs and the bottom of the stairs. If you do not have them, ask someone to add them for you. What else can I do to help prevent falls?  Wear shoes that:  Do not have high heels.  Have rubber bottoms.  Are comfortable and fit you well.  Are closed at the toe. Do not wear sandals.  If you use a stepladder:  Make sure that it is fully opened. Do not climb a closed stepladder.  Make  sure that both sides of the stepladder are locked into place.  Ask someone to hold it for you, if possible.  Clearly mark and make sure that you can see:  Any grab bars or handrails.  First and last steps.  Where the edge of each step is.  Use tools that help you move around (mobility aids) if they are needed. These include:  Canes.  Walkers.  Scooters.  Crutches.  Turn on the lights when you go into a dark area. Replace any light bulbs as soon as they burn out.  Set up your furniture so you have a clear path. Avoid moving your furniture around.  If any of your floors are uneven, fix them.  If there are any pets around you, be aware of where they are.  Review your medicines with your doctor. Some medicines can make you feel dizzy. This can increase your chance of falling. Ask your doctor what other things that you can do to help prevent falls. This information is not intended to replace advice given to you by your health care provider. Make sure you discuss any questions you have with your health care provider. Document Released: 02/20/2009 Document Revised: 10/02/2015 Document Reviewed: 05/31/2014 Elsevier Interactive Patient Education  2017 [employer].

## 2018-03-30 NOTE — Assessment & Plan Note (Signed)
Just had dosage change.  Checking thyroid through endocrinology

## 2018-03-30 NOTE — Assessment & Plan Note (Signed)
Discussed shortness of breath and fatigue and discussed walking with patient as other doctors have also.

## 2018-03-31 LAB — LIPID PANEL
Chol/HDL Ratio: 5.2 ratio — ABNORMAL HIGH (ref 0.0–4.4)
Cholesterol, Total: 201 mg/dL — ABNORMAL HIGH (ref 100–199)
HDL: 39 mg/dL — ABNORMAL LOW (ref >39)
LDL Calculated: 133 mg/dL — ABNORMAL HIGH (ref 0–99)
Triglycerides: 144 mg/dL (ref 0–149)
VLDL Cholesterol Cal: 29 mg/dL (ref 5–40)

## 2018-03-31 LAB — SELENIUM SERUM: Selenium, S/P: 151 ug/L (ref 91–198)

## 2018-04-03 ENCOUNTER — Encounter: Payer: Self-pay | Admitting: Family Medicine

## 2018-04-03 DIAGNOSIS — H35372 Puckering of macula, left eye: Secondary | ICD-10-CM | POA: Diagnosis not present

## 2018-04-17 ENCOUNTER — Telehealth: Payer: Self-pay | Admitting: Obstetrics and Gynecology

## 2018-04-17 DIAGNOSIS — E039 Hypothyroidism, unspecified: Secondary | ICD-10-CM | POA: Diagnosis not present

## 2018-04-17 NOTE — Telephone Encounter (Signed)
The patient called and stated that she would like to speak with her nurse in regards to her needing two prescriptions renewed prior to Dr. Tennis Must leaving. Please advise.

## 2018-04-17 NOTE — Telephone Encounter (Signed)
I spoke with patient and she has enough medication to last until May 2020. Patient doe snot know who she is going to go to at this point. She will call back after the first of the year to schedule.

## 2018-04-18 DIAGNOSIS — L600 Ingrowing nail: Secondary | ICD-10-CM | POA: Diagnosis not present

## 2018-04-19 DIAGNOSIS — E039 Hypothyroidism, unspecified: Secondary | ICD-10-CM | POA: Diagnosis not present

## 2018-04-19 DIAGNOSIS — R0602 Shortness of breath: Secondary | ICD-10-CM | POA: Diagnosis not present

## 2018-04-19 DIAGNOSIS — J309 Allergic rhinitis, unspecified: Secondary | ICD-10-CM | POA: Diagnosis not present

## 2018-04-26 DIAGNOSIS — I10 Essential (primary) hypertension: Secondary | ICD-10-CM | POA: Diagnosis not present

## 2018-04-26 DIAGNOSIS — E039 Hypothyroidism, unspecified: Secondary | ICD-10-CM | POA: Diagnosis not present

## 2018-05-22 DIAGNOSIS — L600 Ingrowing nail: Secondary | ICD-10-CM | POA: Diagnosis not present

## 2018-05-22 DIAGNOSIS — B351 Tinea unguium: Secondary | ICD-10-CM | POA: Diagnosis not present

## 2018-05-22 DIAGNOSIS — L602 Onychogryphosis: Secondary | ICD-10-CM | POA: Diagnosis not present

## 2018-05-23 DIAGNOSIS — R5382 Chronic fatigue, unspecified: Secondary | ICD-10-CM | POA: Diagnosis not present

## 2018-05-30 DIAGNOSIS — R0602 Shortness of breath: Secondary | ICD-10-CM | POA: Diagnosis not present

## 2018-05-30 DIAGNOSIS — Z13220 Encounter for screening for lipoid disorders: Secondary | ICD-10-CM | POA: Diagnosis not present

## 2018-05-30 DIAGNOSIS — I10 Essential (primary) hypertension: Secondary | ICD-10-CM | POA: Diagnosis not present

## 2018-05-30 DIAGNOSIS — J309 Allergic rhinitis, unspecified: Secondary | ICD-10-CM | POA: Diagnosis not present

## 2018-06-02 DIAGNOSIS — Z23 Encounter for immunization: Secondary | ICD-10-CM | POA: Diagnosis not present

## 2018-06-05 ENCOUNTER — Other Ambulatory Visit: Payer: Medicare Other

## 2018-06-05 ENCOUNTER — Other Ambulatory Visit: Payer: Self-pay | Admitting: Family Medicine

## 2018-06-05 DIAGNOSIS — R5383 Other fatigue: Secondary | ICD-10-CM | POA: Diagnosis not present

## 2018-06-05 NOTE — Progress Notes (Signed)
Phone call Discussed with patient marked fatigue. Patient concerned about thyroid is taking her thyroid medications every day. Reviewed recent labs thyroid done last month and was normal. Will check CBC CMP and sedimentation rate. Reviewed patient not taking cholesterol medication with 50% blockage of 1 coronary artery again encouraged strongly to take cholesterol medications. Patient also having what sounds like spinal stenosis symptoms working with chiropractor. Further evaluate pending labs.

## 2018-06-06 ENCOUNTER — Other Ambulatory Visit: Payer: Self-pay | Admitting: Family Medicine

## 2018-06-06 DIAGNOSIS — I2510 Atherosclerotic heart disease of native coronary artery without angina pectoris: Secondary | ICD-10-CM

## 2018-06-06 DIAGNOSIS — I2583 Coronary atherosclerosis due to lipid rich plaque: Principal | ICD-10-CM

## 2018-06-06 LAB — COMPREHENSIVE METABOLIC PANEL
ALT: 20 IU/L (ref 0–32)
AST: 14 IU/L (ref 0–40)
Albumin/Globulin Ratio: 2 (ref 1.2–2.2)
Albumin: 4.4 g/dL (ref 3.8–4.8)
Alkaline Phosphatase: 54 IU/L (ref 39–117)
BUN/Creatinine Ratio: 22 (ref 12–28)
BUN: 24 mg/dL (ref 8–27)
Bilirubin Total: 0.2 mg/dL (ref 0.0–1.2)
CO2: 22 mmol/L (ref 20–29)
Calcium: 9.6 mg/dL (ref 8.7–10.3)
Chloride: 102 mmol/L (ref 96–106)
Creatinine, Ser: 1.11 mg/dL — ABNORMAL HIGH (ref 0.57–1.00)
GFR calc Af Amer: 58 mL/min/{1.73_m2} — ABNORMAL LOW (ref >59)
GFR calc non Af Amer: 50 mL/min/{1.73_m2} — ABNORMAL LOW (ref >59)
Globulin, Total: 2.2 g/dL (ref 1.5–4.5)
Glucose: 88 mg/dL (ref 65–99)
Potassium: 4.3 mmol/L (ref 3.5–5.2)
Sodium: 138 mmol/L (ref 134–144)
Total Protein: 6.6 g/dL (ref 6.0–8.5)

## 2018-06-06 LAB — CBC WITH DIFFERENTIAL/PLATELET
Basophils Absolute: 0 10*3/uL (ref 0.0–0.2)
Basos: 1 %
EOS (ABSOLUTE): 0.2 10*3/uL (ref 0.0–0.4)
Eos: 2 %
Hematocrit: 38.1 % (ref 34.0–46.6)
Hemoglobin: 13 g/dL (ref 11.1–15.9)
Immature Grans (Abs): 0.1 10*3/uL (ref 0.0–0.1)
Immature Granulocytes: 1 %
Lymphocytes Absolute: 2.2 10*3/uL (ref 0.7–3.1)
Lymphs: 29 %
MCH: 31.5 pg (ref 26.6–33.0)
MCHC: 34.1 g/dL (ref 31.5–35.7)
MCV: 92 fL (ref 79–97)
Monocytes Absolute: 0.6 10*3/uL (ref 0.1–0.9)
Monocytes: 7 %
Neutrophils Absolute: 4.7 10*3/uL (ref 1.4–7.0)
Neutrophils: 60 %
Platelets: 214 10*3/uL (ref 150–450)
RBC: 4.13 x10E6/uL (ref 3.77–5.28)
RDW: 12.5 % (ref 11.7–15.4)
WBC: 7.7 10*3/uL (ref 3.4–10.8)

## 2018-06-06 LAB — SEDIMENTATION RATE: Sed Rate: 6 mm/hr (ref 0–40)

## 2018-06-06 NOTE — Progress Notes (Signed)
Phone call Discussed with patient absolutely feels terrible. Reviewed labs with normal CBC and CMP and normal sed rate. Patient has not taken any cholesterol agents as recommended for her known 50% lesion in her coronary artery. Will refer back to cardiology before other further work-up.

## 2018-06-08 ENCOUNTER — Other Ambulatory Visit: Payer: Self-pay | Admitting: Family Medicine

## 2018-06-08 DIAGNOSIS — E039 Hypothyroidism, unspecified: Secondary | ICD-10-CM | POA: Diagnosis not present

## 2018-06-08 DIAGNOSIS — Z9889 Other specified postprocedural states: Secondary | ICD-10-CM | POA: Diagnosis not present

## 2018-06-08 DIAGNOSIS — E782 Mixed hyperlipidemia: Secondary | ICD-10-CM | POA: Diagnosis not present

## 2018-06-08 DIAGNOSIS — I10 Essential (primary) hypertension: Secondary | ICD-10-CM | POA: Diagnosis not present

## 2018-06-08 DIAGNOSIS — R600 Localized edema: Secondary | ICD-10-CM | POA: Diagnosis not present

## 2018-06-08 DIAGNOSIS — N183 Chronic kidney disease, stage 3 (moderate): Secondary | ICD-10-CM | POA: Diagnosis not present

## 2018-06-08 DIAGNOSIS — R0602 Shortness of breath: Secondary | ICD-10-CM | POA: Diagnosis not present

## 2018-06-08 DIAGNOSIS — R0609 Other forms of dyspnea: Secondary | ICD-10-CM | POA: Diagnosis not present

## 2018-06-08 DIAGNOSIS — R5383 Other fatigue: Secondary | ICD-10-CM | POA: Diagnosis not present

## 2018-06-08 DIAGNOSIS — I83893 Varicose veins of bilateral lower extremities with other complications: Secondary | ICD-10-CM | POA: Diagnosis not present

## 2018-06-08 DIAGNOSIS — I208 Other forms of angina pectoris: Secondary | ICD-10-CM | POA: Diagnosis not present

## 2018-06-08 DIAGNOSIS — J439 Emphysema, unspecified: Secondary | ICD-10-CM | POA: Diagnosis not present

## 2018-06-09 ENCOUNTER — Other Ambulatory Visit: Payer: Self-pay | Admitting: Family Medicine

## 2018-06-09 DIAGNOSIS — I10 Essential (primary) hypertension: Secondary | ICD-10-CM

## 2018-06-26 DIAGNOSIS — N183 Chronic kidney disease, stage 3 (moderate): Secondary | ICD-10-CM | POA: Diagnosis not present

## 2018-06-26 DIAGNOSIS — R600 Localized edema: Secondary | ICD-10-CM | POA: Diagnosis not present

## 2018-06-26 DIAGNOSIS — I10 Essential (primary) hypertension: Secondary | ICD-10-CM | POA: Diagnosis not present

## 2018-06-26 DIAGNOSIS — J329 Chronic sinusitis, unspecified: Secondary | ICD-10-CM | POA: Diagnosis not present

## 2018-06-26 DIAGNOSIS — J019 Acute sinusitis, unspecified: Secondary | ICD-10-CM | POA: Diagnosis not present

## 2018-06-28 DIAGNOSIS — J309 Allergic rhinitis, unspecified: Secondary | ICD-10-CM | POA: Diagnosis not present

## 2018-07-04 DIAGNOSIS — J449 Chronic obstructive pulmonary disease, unspecified: Secondary | ICD-10-CM | POA: Diagnosis not present

## 2018-07-04 DIAGNOSIS — J310 Chronic rhinitis: Secondary | ICD-10-CM | POA: Diagnosis not present

## 2018-07-19 DIAGNOSIS — R638 Other symptoms and signs concerning food and fluid intake: Secondary | ICD-10-CM | POA: Diagnosis not present

## 2018-07-19 DIAGNOSIS — R0602 Shortness of breath: Secondary | ICD-10-CM | POA: Diagnosis not present

## 2018-07-19 DIAGNOSIS — J309 Allergic rhinitis, unspecified: Secondary | ICD-10-CM | POA: Diagnosis not present

## 2018-08-31 ENCOUNTER — Encounter: Payer: Self-pay | Admitting: Family Medicine

## 2018-08-31 ENCOUNTER — Ambulatory Visit (INDEPENDENT_AMBULATORY_CARE_PROVIDER_SITE_OTHER): Payer: Medicare Other | Admitting: Family Medicine

## 2018-08-31 ENCOUNTER — Other Ambulatory Visit: Payer: Self-pay

## 2018-08-31 DIAGNOSIS — I10 Essential (primary) hypertension: Secondary | ICD-10-CM | POA: Diagnosis not present

## 2018-08-31 DIAGNOSIS — I2510 Atherosclerotic heart disease of native coronary artery without angina pectoris: Secondary | ICD-10-CM

## 2018-08-31 DIAGNOSIS — J3089 Other allergic rhinitis: Secondary | ICD-10-CM

## 2018-08-31 DIAGNOSIS — G8929 Other chronic pain: Secondary | ICD-10-CM | POA: Diagnosis not present

## 2018-08-31 DIAGNOSIS — I2583 Coronary atherosclerosis due to lipid rich plaque: Secondary | ICD-10-CM | POA: Diagnosis not present

## 2018-08-31 DIAGNOSIS — I700 Atherosclerosis of aorta: Secondary | ICD-10-CM | POA: Diagnosis not present

## 2018-08-31 DIAGNOSIS — M5442 Lumbago with sciatica, left side: Secondary | ICD-10-CM | POA: Diagnosis not present

## 2018-08-31 MED ORDER — LOSARTAN POTASSIUM 100 MG PO TABS: 100.0000 mg | ORAL_TABLET | Freq: Every day | ORAL | 2 refills | Status: DC

## 2018-08-31 NOTE — Progress Notes (Signed)
LMP  (LMP Unknown)    Subjective:    Patient ID: Julia Deleon, female    DOB: 09/22/1945, 73 y.o.   MRN: [id number]  HPI: Julia Deleon is a 73 y.o. female  Med check Telemedicine using audio/video telecommunications for a synchronous communication visit. Today's visit due to COVID-19 isolation precautions I connected with and verified that I am speaking with the correct person using two identifiers.   I discussed the limitations, risks, security and privacy concerns of performing an evaluation and management service by telecommunication and the availability of in person appointments. I also discussed with the patient that there may be a patient responsible charge related to this service. The patient expressed understanding and agreed to proceed. The patient's location is home. I am at home.   Relevant past medical, surgical, family and social history reviewed and updated as indicated. Interim medical history since our last visit reviewed. Allergies and medications reviewed and updated.  Review of Systems  Constitutional: Negative.   Respiratory: Negative.   Cardiovascular: Negative.     Per HPI unless specifically indicated above     Objective:    LMP  (LMP Unknown)   Wt Readings from Last 3 Encounters:  03/30/18 158 lb (71.7 kg)  03/30/18 158 lb 2 oz (71.7 kg)  02/23/18 156 lb (70.8 kg)    Physical Exam  Results for orders placed or performed in visit on 06/05/18  Sed Rate (ESR)  Result Value Ref Range   Sed Rate 6 0 - 40 mm/hr  Comprehensive metabolic panel  Result Value Ref Range   Glucose 88 65 - 99 mg/dL   BUN 24 8 - 27 mg/dL   Creatinine, Ser 1.11 (H) 0.57 - 1.00 mg/dL   GFR calc non Af Amer 50 (L) >59 mL/min/1.73   GFR calc Af Amer 58 (L) >59 mL/min/1.73   BUN/Creatinine Ratio 22 12 - 28   Sodium 138 134 - 144 mmol/L   Potassium 4.3 3.5 - 5.2 mmol/L   Chloride 102 96 - 106 mmol/L   CO2 22 20 - 29 mmol/L   Calcium 9.6 8.7 - 10.3 mg/dL   Total Protein 6.6  6.0 - 8.5 g/dL   Albumin 4.4 3.8 - 4.8 g/dL   Globulin, Total 2.2 1.5 - 4.5 g/dL   Albumin/Globulin Ratio 2.0 1.2 - 2.2   Bilirubin Total 0.2 0.0 - 1.2 mg/dL   Alkaline Phosphatase 54 39 - 117 IU/L   AST 14 0 - 40 IU/L   ALT 20 0 - 32 IU/L  CBC with Differential/Platelet  Result Value Ref Range   WBC 7.7 3.4 - 10.8 x10E3/uL   RBC 4.13 3.77 - 5.28 x10E6/uL   Hemoglobin 13.0 11.1 - 15.9 g/dL   Hematocrit 38.1 34.0 - 46.6 %   MCV 92 79 - 97 fL   MCH 31.5 26.6 - 33.0 pg   MCHC 34.1 31.5 - 35.7 g/dL   RDW 12.5 11.7 - 15.4 %   Platelets 214 150 - 450 x10E3/uL   Neutrophils 60 Not Estab. %   Lymphs 29 Not Estab. %   Monocytes 7 Not Estab. %   Eos 2 Not Estab. %   Basos 1 Not Estab. %   Neutrophils Absolute 4.7 1.4 - 7.0 x10E3/uL   Lymphocytes Absolute 2.2 0.7 - 3.1 x10E3/uL   Monocytes Absolute 0.6 0.1 - 0.9 x10E3/uL   EOS (ABSOLUTE) 0.2 0.0 - 0.4 x10E3/uL   Basophils Absolute 0.0 0.0 - 0.2 x10E3/uL  Immature Granulocytes 1 Not Estab. %   Immature Grans (Abs) 0.1 0.0 - 0.1 x10E3/uL      Assessment & Plan:   Problem List Items Addressed This Visit      Cardiovascular and Mediastinum   Aortic atherosclerosis (HCC)    stable      Relevant Medications   losartan (COZAAR) 100 MG tablet     Respiratory   Other allergic rhinitis    Discussed with patient allergy testing which is pending at [facility]        Other   Low back pain    Spinal stenosis sx and worsening leg radiation sx.      Relevant Orders   Ambulatory referral to Neurosurgery    Other Visit Diagnoses    Essential hypertension       Relevant Medications   losartan (COZAAR) 100 MG tablet      I discussed the assessment and treatment plan with the patient. The patient was provided an opportunity to ask questions and all were answered. The patient agreed with the plan and demonstrated an understanding of the instructions.   The patient was advised to call back or seek an in-person  evaluation if the symptoms worsen or if the condition fails to improve as anticipated.   I provided 21+ minutes of time during this encounter. Follow up plan: Return in about 3 months (around 11/30/2018).

## 2018-08-31 NOTE — Assessment & Plan Note (Signed)
Discussed with patient allergy testing which is pending at [facility] nose [facility]

## 2018-08-31 NOTE — Assessment & Plan Note (Signed)
stable °

## 2018-08-31 NOTE — Assessment & Plan Note (Signed)
Spinal stenosis sx and worsening leg radiation sx.

## 2018-09-07 ENCOUNTER — Other Ambulatory Visit: Payer: Self-pay | Admitting: Family Medicine

## 2018-09-07 DIAGNOSIS — M4808 Spinal stenosis, sacral and sacrococcygeal region: Secondary | ICD-10-CM

## 2018-09-07 MED ORDER — CHLORZOXAZONE 500 MG PO TABS: 500.0000 mg | ORAL_TABLET | Freq: Four times a day (QID) | ORAL | 2 refills | Status: DC | PRN

## 2018-09-07 NOTE — Progress Notes (Signed)
Discussed with patient worsening radicular symptoms and spinal stenosis symptoms. Neurosurgery wants MRI done prior to appointment.  We will go ahead and attempt to schedule and do battle with insurance company.

## 2018-09-12 ENCOUNTER — Ambulatory Visit
Admission: RE | Admit: 2018-09-12 | Discharge: 2018-09-12 | Disposition: A | Discharge status: Home / Self Care | Payer: Medicare Other | Source: Ambulatory Visit | Attending: Family Medicine | Admitting: Family Medicine

## 2018-09-12 ENCOUNTER — Other Ambulatory Visit: Payer: Self-pay

## 2018-09-12 DIAGNOSIS — M545 Low back pain: Secondary | ICD-10-CM | POA: Diagnosis not present

## 2018-09-12 DIAGNOSIS — M4808 Spinal stenosis, sacral and sacrococcygeal region: Secondary | ICD-10-CM | POA: Insufficient documentation

## 2018-09-15 DIAGNOSIS — J301 Allergic rhinitis due to pollen: Secondary | ICD-10-CM | POA: Diagnosis not present

## 2018-09-19 DIAGNOSIS — M5416 Radiculopathy, lumbar region: Secondary | ICD-10-CM | POA: Diagnosis not present

## 2018-09-19 DIAGNOSIS — M545 Low back pain: Secondary | ICD-10-CM | POA: Diagnosis not present

## 2018-09-19 DIAGNOSIS — M48062 Spinal stenosis, lumbar region with neurogenic claudication: Secondary | ICD-10-CM | POA: Diagnosis not present

## 2018-09-19 DIAGNOSIS — M4316 Spondylolisthesis, lumbar region: Secondary | ICD-10-CM | POA: Diagnosis not present

## 2018-09-20 ENCOUNTER — Other Ambulatory Visit: Payer: Self-pay | Admitting: Neurosurgery

## 2018-09-21 NOTE — Pre-Procedure Instructions (Signed)
Julia Deleon  09/21/2018      [facility] [place], [place] [address] Phone: [phone] Fax: [phone]  [place], [address] [address] Phone: [phone] Fax: [phone]  [place], [place] [address] [address] Phone: [phone] Fax: [phone] [place], [place] [address] Phone: [phone] Fax: [phone]    Your procedure is scheduled on May 19  Report to [facility] Entrance A at 5:30 A.M.  Call this number if you have problems the morning of surgery:  [phone]   Remember:  Do not eat or drink after midnight.      Take these medicines the morning of surgery with A SIP OF WATER :              Tylenol if needed              Nasal spray             Fexofenadine (allegra)             Levothyroxine (synthroid)             Montelukast (singular)            7 days prior to surgery STOP taking any Aspirin (unless otherwise instructed by your surgeon), Aleve, Naproxen, Ibuprofen, Motrin, Advil, Goody's, BC's, all herbal medications, fish oil, and all vitamins.    Do not wear jewelry, make-up or nail polish.  Do not wear lotions, powders, or perfumes, or deodorant.  Do not shave 48 hours prior to surgery.  Men may shave face and neck.  Do not bring valuables to the hospital.  [facility] is not responsible for any belongings or valuables.  Contacts, dentures or bridgework may not be worn into surgery.  Leave your suitcase in the car.  After surgery it may be brought to your room.  For patients admitted to the hospital, discharge time will be determined by your treatment team.  Patients discharged the day of surgery will not be allowed to drive home.    Special instructions:  [facility]- Preparing For Surgery  Before  surgery, you can play an important role. Because skin is not sterile, your skin needs to be as free of germs as possible. You can reduce the number of germs on your skin by washing with CHG (chlorahexidine gluconate) Soap before surgery.  CHG is an antiseptic cleaner which kills germs and bonds with the skin to continue killing germs even after washing.    Oral Hygiene is also important to reduce your risk of infection.  Remember - BRUSH YOUR TEETH THE MORNING OF SURGERY WITH YOUR REGULAR TOOTHPASTE  Please do not use if you have an allergy to CHG or antibacterial soaps. If your skin becomes reddened/irritated stop using the CHG.  Do not shave (including legs and underarms) for at least 48 hours prior to first CHG shower. It is OK to shave your face.  Please follow these instructions carefully.   1. Shower the NIGHT BEFORE SURGERY and the MORNING OF SURGERY with CHG.   2. If you chose to wash your hair, wash your hair first as usual with your normal shampoo.  3. After you shampoo, rinse your hair and body thoroughly  to remove the shampoo.  4. Use CHG as you would any other liquid soap. You can apply CHG directly to the skin and wash gently with a scrungie or a clean washcloth.   5. Apply the CHG Soap to your body ONLY FROM THE NECK DOWN.  Do not use on open wounds or open sores. Avoid contact with your eyes, ears, mouth and genitals (private parts). Wash Face and genitals (private parts)  with your normal soap.  6. Wash thoroughly, paying special attention to the area where your surgery will be performed.  7. Thoroughly rinse your body with warm water from the neck down.  8. DO NOT shower/wash with your normal soap after using and rinsing off the CHG Soap.  9. Pat yourself dry with a CLEAN TOWEL.  10. Wear CLEAN PAJAMAS to bed the night before surgery, wear comfortable clothes the morning of surgery  11. Place CLEAN SHEETS on your bed the night of your first shower and DO NOT SLEEP WITH  PETS.    Day of Surgery:  Do not apply any deodorants/lotions.  Please wear clean clothes to the hospital/surgery center.   Remember to brush your teeth WITH YOUR REGULAR TOOTHPASTE.    Please read over the following fact sheets that you were given. Coughing and Deep Breathing and Surgical Site Infection Prevention

## 2018-09-22 ENCOUNTER — Encounter (HOSPITAL_COMMUNITY)
Admission: RE | Admit: 2018-09-22 | Discharge: 2018-09-22 | Disposition: A | Discharge status: Home / Self Care | Payer: Medicare Other | Source: Ambulatory Visit | Attending: Neurosurgery | Admitting: Neurosurgery

## 2018-09-22 ENCOUNTER — Encounter (HOSPITAL_COMMUNITY): Payer: Self-pay

## 2018-09-22 ENCOUNTER — Other Ambulatory Visit: Payer: Self-pay

## 2018-09-22 ENCOUNTER — Other Ambulatory Visit (HOSPITAL_COMMUNITY)
Admission: RE | Admit: 2018-09-22 | Discharge: 2018-09-22 | Disposition: A | Discharge status: Home / Self Care | Payer: Medicare Other | Source: Ambulatory Visit | Attending: Neurosurgery | Admitting: Neurosurgery

## 2018-09-22 DIAGNOSIS — Z1159 Encounter for screening for other viral diseases: Secondary | ICD-10-CM | POA: Diagnosis not present

## 2018-09-22 DIAGNOSIS — Z01812 Encounter for preprocedural laboratory examination: Secondary | ICD-10-CM | POA: Diagnosis not present

## 2018-09-22 HISTORY — DX: Nausea with vomiting, unspecified: R11.2

## 2018-09-22 HISTORY — DX: Hypothyroidism, unspecified: E03.9

## 2018-09-22 HISTORY — DX: Other specified postprocedural states: Z98.890

## 2018-09-22 HISTORY — DX: Unspecified osteoarthritis, unspecified site: M19.90

## 2018-09-22 HISTORY — DX: Family history of other specified conditions: Z84.89

## 2018-09-22 LAB — CBC
HCT: 40.5 % (ref 36.0–46.0)
Hemoglobin: 13 g/dL (ref 12.0–15.0)
MCH: 31 pg (ref 26.0–34.0)
MCHC: 32.1 g/dL (ref 30.0–36.0)
MCV: 96.7 fL (ref 80.0–100.0)
Platelets: 187 10*3/uL (ref 150–400)
RBC: 4.19 MIL/uL (ref 3.87–5.11)
RDW: 13.2 % (ref 11.5–15.5)
WBC: 7 10*3/uL (ref 4.0–10.5)
nRBC: 0 % (ref 0.0–0.2)

## 2018-09-22 LAB — BASIC METABOLIC PANEL
Anion gap: 8 (ref 5–15)
BUN: 20 mg/dL (ref 8–23)
CO2: 25 mmol/L (ref 22–32)
Calcium: 9 mg/dL (ref 8.9–10.3)
Chloride: 105 mmol/L (ref 98–111)
Creatinine, Ser: 1.28 mg/dL — ABNORMAL HIGH (ref 0.44–1.00)
GFR calc Af Amer: 49 mL/min — ABNORMAL LOW (ref >60)
GFR calc non Af Amer: 42 mL/min — ABNORMAL LOW (ref >60)
Glucose, Bld: 152 mg/dL — ABNORMAL HIGH (ref 70–99)
Potassium: 4.1 mmol/L (ref 3.5–5.1)
Sodium: 138 mmol/L (ref 135–145)

## 2018-09-22 LAB — TYPE AND SCREEN
ABO/RH(D): O POS
Antibody Screen: NEGATIVE

## 2018-09-22 LAB — ABO/RH: ABO/RH(D): O POS

## 2018-09-22 LAB — SURGICAL PCR SCREEN
MRSA, PCR: NEGATIVE
Staphylococcus aureus: NEGATIVE

## 2018-09-22 NOTE — Progress Notes (Signed)
PCP - Dr. Golden Pop Cardiologist - Dr. Clayborn Bigness Pulm: Dr. Raul Del Endocrinology: Dr. Pasty Arch and Dr. Gabriel Carina   Chest x-ray - N/A EKG - 10/17/17 Stress Test - 11/16/17 ECHO - 11/16/17 Cardiac Cath - 12/06/17  Sleep Study - denies  Blood Thinner Instructions:N/A Aspirin Instructions:N/A  Anesthesia review: Yes; EKG review and heart history.   Pt confirmed that she has an appointment to be tested for CoVID-19 at the drive-thru screening today after PAT appointment.   Patient denies shortness of breath, fever, cough and chest pain at PAT appointment   Patient verbalized understanding of instructions that were given to them at the PAT appointment. Patient was also instructed that they will need to review over the PAT instructions again at home before surgery.    Coronavirus Screening  Have you experienced the following symptoms:  Cough yes/no: No Fever (>100.31F)  yes/no: No Runny nose yes/no: No Sore throat yes/no: No Difficulty breathing/shortness of breath  yes/no: No  Have you or a family member traveled in the last 14 days and where? yes/no: No   If the patient indicates "YES" to the above questions, their PAT will be rescheduled to limit the exposure to others and, the surgeon will be notified. THE PATIENT WILL NEED TO BE ASYMPTOMATIC FOR 14 DAYS.   If the patient is not experiencing any of these symptoms, the PAT nurse will instruct them to NOT bring anyone with them to their appointment since they may have these symptoms or traveled as well.   Please remind your patients and families that hospital visitation restrictions are in effect and the importance of the restrictions.

## 2018-09-23 LAB — NOVEL CORONAVIRUS, NAA (HOSP ORDER, SEND-OUT TO REF LAB; TAT 18-24 HRS): SARS-CoV-2, NAA: NOT DETECTED

## 2018-09-25 ENCOUNTER — Other Ambulatory Visit: Payer: Self-pay | Admitting: Neurosurgery

## 2018-09-25 NOTE — Anesthesia Preprocedure Evaluation (Addendum)
Anesthesia Evaluation  Patient identified by MRN, date of birth, ID band Patient awake    Reviewed: Allergy & Precautions, NPO status , Patient's Chart, lab work & pertinent test results  Airway Mallampati: II  TM Distance: >3 FB Neck ROM: Full    Dental  (+) Teeth Intact, Dental Advisory Given   Pulmonary    breath sounds clear to auscultation       Cardiovascular  Rhythm:Regular Rate:Normal     Neuro/Psych    GI/Hepatic   Endo/Other    Renal/GU      Musculoskeletal   Abdominal   Peds  Hematology   Anesthesia Other Findings   Reproductive/Obstetrics                             Anesthesia Physical Anesthesia Plan  ASA: II  Anesthesia Plan: General   Post-op Pain Management:    Induction: Intravenous  PONV Risk Score and Plan: Ondansetron and Dexamethasone  Airway Management Planned: Oral ETT  Additional Equipment:   Intra-op Plan:   Post-operative Plan: Extubation in OR  Informed Consent: I have reviewed the patients History and Physical, chart, labs and discussed the procedure including the risks, benefits and alternatives for the proposed anesthesia with the patient or authorized representative who has indicated his/her understanding and acceptance.     Dental advisory given  Plan Discussed with: CRNA and Anesthesiologist  Anesthesia Plan Comments: (Pt underwent cardiovascular eval Jun 2019 for fatigue and dyspnea. She had a nuclear stress showing no ischemia, echo with normal LVEF and no significant valvular abnormalities, and R/LHC showing moderate CAD but no intervention recommended. Continues to follow with cardiology at [facility]. Also follows with pulmonology at [facility] for mild-mod COPD, uses albuterol PRN.   -Cath 12/06/2017 Right heart cath appeared to be relatively normal with normal pressures Left heart cath with normal left ventricular function of 55% Left coronary  artery with moderate disease in mid to proximal LAD as well as involving the first diagonal with moderate disease not amenable to intervention probably nonischemic Right coronary artery also essentially free of disease Recommendation conservative cardiac therapy no intervention indicated. No indication for antiplatelet therapy at this time  -TTE 11/16/17 (care everywhere): NORMAL LEFT VENTRICULAR SYSTOLIC FUNCTION NORMAL RIGHT VENTRICULAR SYSTOLIC FUNCTION MILD VALVULAR REGURGITATION NO VALVULAR STENOSIS MILD AR, TR, PR TRIVIAL MR EF 55-60%  -Nuclear stress 11/16/17 (care everywhere): Normal myocardial perfusion scan no evidence of stress-induced  myocardial-ischemia ejection fraction of 69% conclusion negative scan)      Anesthesia Quick Evaluation

## 2018-09-26 ENCOUNTER — Inpatient Hospital Stay (HOSPITAL_COMMUNITY): Payer: Medicare Other

## 2018-09-26 ENCOUNTER — Encounter (HOSPITAL_COMMUNITY)
Admission: RE | Disposition: A | Discharge status: Home / Self Care | Payer: Self-pay | Source: Home / Self Care | Attending: Neurosurgery

## 2018-09-26 ENCOUNTER — Inpatient Hospital Stay (HOSPITAL_COMMUNITY): Payer: Medicare Other | Admitting: Vascular Surgery

## 2018-09-26 ENCOUNTER — Other Ambulatory Visit: Payer: Self-pay

## 2018-09-26 ENCOUNTER — Observation Stay (HOSPITAL_COMMUNITY)
Admission: RE | Admit: 2018-09-26 | Discharge: 2018-09-27 | Disposition: A | Discharge status: Home / Self Care | Payer: Medicare Other | Attending: Neurosurgery | Admitting: Neurosurgery

## 2018-09-26 ENCOUNTER — Encounter: Payer: Medicare Other | Admitting: Obstetrics and Gynecology

## 2018-09-26 ENCOUNTER — Ambulatory Visit: Payer: Self-pay | Admitting: Obstetrics & Gynecology

## 2018-09-26 ENCOUNTER — Inpatient Hospital Stay (HOSPITAL_COMMUNITY): Payer: Medicare Other | Admitting: Certified Registered Nurse Anesthetist

## 2018-09-26 ENCOUNTER — Encounter (HOSPITAL_COMMUNITY): Payer: Self-pay

## 2018-09-26 DIAGNOSIS — Z79899 Other long term (current) drug therapy: Secondary | ICD-10-CM | POA: Insufficient documentation

## 2018-09-26 DIAGNOSIS — Z791 Long term (current) use of non-steroidal anti-inflammatories (NSAID): Secondary | ICD-10-CM | POA: Insufficient documentation

## 2018-09-26 DIAGNOSIS — Z79890 Hormone replacement therapy (postmenopausal): Secondary | ICD-10-CM | POA: Insufficient documentation

## 2018-09-26 DIAGNOSIS — I10 Essential (primary) hypertension: Secondary | ICD-10-CM | POA: Diagnosis not present

## 2018-09-26 DIAGNOSIS — M5416 Radiculopathy, lumbar region: Secondary | ICD-10-CM | POA: Insufficient documentation

## 2018-09-26 DIAGNOSIS — Z981 Arthrodesis status: Secondary | ICD-10-CM | POA: Diagnosis not present

## 2018-09-26 DIAGNOSIS — M4316 Spondylolisthesis, lumbar region: Secondary | ICD-10-CM | POA: Diagnosis not present

## 2018-09-26 DIAGNOSIS — Z419 Encounter for procedure for purposes other than remedying health state, unspecified: Secondary | ICD-10-CM

## 2018-09-26 DIAGNOSIS — J449 Chronic obstructive pulmonary disease, unspecified: Secondary | ICD-10-CM | POA: Diagnosis not present

## 2018-09-26 DIAGNOSIS — E039 Hypothyroidism, unspecified: Secondary | ICD-10-CM | POA: Insufficient documentation

## 2018-09-26 DIAGNOSIS — M48062 Spinal stenosis, lumbar region with neurogenic claudication: Secondary | ICD-10-CM | POA: Insufficient documentation

## 2018-09-26 DIAGNOSIS — M549 Dorsalgia, unspecified: Secondary | ICD-10-CM | POA: Diagnosis not present

## 2018-09-26 DIAGNOSIS — M5136 Other intervertebral disc degeneration, lumbar region: Secondary | ICD-10-CM | POA: Diagnosis not present

## 2018-09-26 HISTORY — PX: BACK SURGERY: SHX140

## 2018-09-26 SURGERY — POSTERIOR LUMBAR FUSION 1 LEVEL
Anesthesia: General

## 2018-09-26 MED ORDER — LACTATED RINGERS IV SOLN
INTRAVENOUS | Status: DC
  Administered 2018-09-26 (×2): via INTRAVENOUS

## 2018-09-26 MED ORDER — BACITRACIN ZINC 500 UNIT/GM EX OINT
TOPICAL_OINTMENT | CUTANEOUS | Status: AC
  Filled 2018-09-26: qty 28.35

## 2018-09-26 MED ORDER — SODIUM CHLORIDE 0.9% FLUSH
3.0000 mL | Freq: Two times a day (BID) | INTRAVENOUS | Status: DC
  Administered 2018-09-26: 20:00:00 3 mL via INTRAVENOUS

## 2018-09-26 MED ORDER — SUGAMMADEX SODIUM 200 MG/2ML IV SOLN
INTRAVENOUS | Status: DC | PRN
  Administered 2018-09-26: 144.4 mg via INTRAVENOUS

## 2018-09-26 MED ORDER — LEVOTHYROXINE SODIUM 75 MCG PO TABS
75.0000 ug | ORAL_TABLET | Freq: Every day | ORAL | Status: DC
  Administered 2018-09-27: 75 ug via ORAL
  Filled 2018-09-26: qty 1

## 2018-09-26 MED ORDER — DEXAMETHASONE SODIUM PHOSPHATE 10 MG/ML IJ SOLN
INTRAMUSCULAR | Status: DC | PRN
  Administered 2018-09-26: 5 mg via INTRAVENOUS

## 2018-09-26 MED ORDER — LOSARTAN POTASSIUM 50 MG PO TABS
100.0000 mg | ORAL_TABLET | Freq: Every day | ORAL | Status: DC
  Administered 2018-09-27: 09:00:00 100 mg via ORAL
  Filled 2018-09-26: qty 2

## 2018-09-26 MED ORDER — ROCURONIUM BROMIDE 50 MG/5ML IV SOSY
PREFILLED_SYRINGE | INTRAVENOUS | Status: DC | PRN
  Administered 2018-09-26: 20 mg via INTRAVENOUS
  Administered 2018-09-26: 10 mg via INTRAVENOUS
  Administered 2018-09-26: 40 mg via INTRAVENOUS

## 2018-09-26 MED ORDER — OXYCODONE HCL 5 MG PO TABS
10.0000 mg | ORAL_TABLET | ORAL | Status: DC | PRN
  Administered 2018-09-26 – 2018-09-27 (×3): 10 mg via ORAL
  Filled 2018-09-26 (×3): qty 2

## 2018-09-26 MED ORDER — VANCOMYCIN HCL 1 G IV SOLR
INTRAVENOUS | Status: DC | PRN
  Administered 2018-09-26: 1000 mg via TOPICAL

## 2018-09-26 MED ORDER — AZELASTINE HCL 0.1 % NA SOLN
1.0000 | Freq: Every day | NASAL | Status: DC
  Administered 2018-09-27: 09:00:00 1 via NASAL
  Filled 2018-09-26: qty 30

## 2018-09-26 MED ORDER — LORATADINE 10 MG PO TABS
10.0000 mg | ORAL_TABLET | Freq: Every day | ORAL | Status: DC
  Administered 2018-09-27: 09:00:00 10 mg via ORAL
  Filled 2018-09-26: qty 1

## 2018-09-26 MED ORDER — FENTANYL CITRATE (PF) 100 MCG/2ML IJ SOLN
INTRAMUSCULAR | Status: AC
  Filled 2018-09-26: qty 2

## 2018-09-26 MED ORDER — FENTANYL CITRATE (PF) 250 MCG/5ML IJ SOLN
INTRAMUSCULAR | Status: AC
  Filled 2018-09-26: qty 5

## 2018-09-26 MED ORDER — ONDANSETRON HCL 4 MG/2ML IJ SOLN
INTRAMUSCULAR | Status: DC | PRN
  Administered 2018-09-26: 4 mg via INTRAVENOUS

## 2018-09-26 MED ORDER — BUPIVACAINE LIPOSOME 1.3 % IJ SUSP
INTRAMUSCULAR | Status: DC | PRN
  Administered 2018-09-26: 20 mL

## 2018-09-26 MED ORDER — VANCOMYCIN HCL IN DEXTROSE 1-5 GM/200ML-% IV SOLN
INTRAVENOUS | Status: AC
  Administered 2018-09-26: 07:00:00 1000 mg via INTRAVENOUS
  Filled 2018-09-26: qty 200

## 2018-09-26 MED ORDER — ONDANSETRON HCL 4 MG PO TABS: 4.0000 mg | ORAL_TABLET | Freq: Four times a day (QID) | ORAL | Status: DC | PRN

## 2018-09-26 MED ORDER — SODIUM CHLORIDE 0.9 % IV SOLN
INTRAVENOUS | Status: DC | PRN
  Administered 2018-09-26: 25 ug/min via INTRAVENOUS

## 2018-09-26 MED ORDER — MENTHOL 3 MG MT LOZG: 1.0000 | LOZENGE | OROMUCOSAL | Status: DC | PRN

## 2018-09-26 MED ORDER — SUCCINYLCHOLINE CHLORIDE 200 MG/10ML IV SOSY
PREFILLED_SYRINGE | INTRAVENOUS | Status: DC | PRN
  Administered 2018-09-26: 120 mg via INTRAVENOUS

## 2018-09-26 MED ORDER — MIDAZOLAM HCL 2 MG/2ML IJ SOLN
INTRAMUSCULAR | Status: AC
  Filled 2018-09-26: qty 2

## 2018-09-26 MED ORDER — PHENYLEPHRINE HCL (PRESSORS) 10 MG/ML IV SOLN
INTRAVENOUS | Status: DC | PRN
  Administered 2018-09-26: 80 ug via INTRAVENOUS
  Administered 2018-09-26: 40 ug via INTRAVENOUS

## 2018-09-26 MED ORDER — PROPOFOL 10 MG/ML IV BOLUS
INTRAVENOUS | Status: DC | PRN
  Administered 2018-09-26: 140 mg via INTRAVENOUS

## 2018-09-26 MED ORDER — BUPIVACAINE LIPOSOME 1.3 % IJ SUSP
20.0000 mL | INTRAMUSCULAR | Status: DC
  Filled 2018-09-26: qty 20

## 2018-09-26 MED ORDER — PHENOL 1.4 % MT LIQD: 1.0000 | OROMUCOSAL | Status: DC | PRN

## 2018-09-26 MED ORDER — SCOPOLAMINE 1 MG/3DAYS TD PT72
MEDICATED_PATCH | TRANSDERMAL | Status: DC | PRN
  Administered 2018-09-26: 1 via TRANSDERMAL

## 2018-09-26 MED ORDER — OXYCODONE HCL 5 MG PO TABS
5.0000 mg | ORAL_TABLET | ORAL | Status: DC | PRN
  Administered 2018-09-26 – 2018-09-27 (×2): 5 mg via ORAL
  Filled 2018-09-26: qty 1

## 2018-09-26 MED ORDER — COQ-10 100 MG PO CAPS: 100.0000 mg | ORAL_CAPSULE | Freq: Every day | ORAL | Status: DC

## 2018-09-26 MED ORDER — EPHEDRINE SULFATE 50 MG/ML IJ SOLN
INTRAMUSCULAR | Status: DC | PRN
  Administered 2018-09-26 (×2): 5 mg via INTRAVENOUS
  Administered 2018-09-26: 10 mg via INTRAVENOUS
  Administered 2018-09-26: 5 mg via INTRAVENOUS
  Administered 2018-09-26: 10 mg via INTRAVENOUS

## 2018-09-26 MED ORDER — CHLORHEXIDINE GLUCONATE CLOTH 2 % EX PADS: 6.0000 | MEDICATED_PAD | Freq: Once | CUTANEOUS | Status: DC

## 2018-09-26 MED ORDER — DOCUSATE SODIUM 100 MG PO CAPS
100.0000 mg | ORAL_CAPSULE | Freq: Two times a day (BID) | ORAL | Status: DC
  Administered 2018-09-26 – 2018-09-27 (×2): 100 mg via ORAL
  Filled 2018-09-26 (×2): qty 1

## 2018-09-26 MED ORDER — SODIUM CHLORIDE 0.9 % IV SOLN: 250.0000 mL | INTRAVENOUS | Status: DC

## 2018-09-26 MED ORDER — OXYCODONE HCL 5 MG PO TABS
ORAL_TABLET | ORAL | Status: AC
  Filled 2018-09-26: qty 2

## 2018-09-26 MED ORDER — VANCOMYCIN HCL 1000 MG IV SOLR
INTRAVENOUS | Status: AC
  Filled 2018-09-26: qty 1000

## 2018-09-26 MED ORDER — ZOLPIDEM TARTRATE 5 MG PO TABS: 5.0000 mg | ORAL_TABLET | Freq: Every evening | ORAL | Status: DC | PRN

## 2018-09-26 MED ORDER — THROMBIN 5000 UNITS EX SOLR
CUTANEOUS | Status: AC
  Filled 2018-09-26: qty 5000

## 2018-09-26 MED ORDER — LIDOCAINE 2% (20 MG/ML) 5 ML SYRINGE
INTRAMUSCULAR | Status: DC | PRN
  Administered 2018-09-26: 60 mg via INTRAVENOUS

## 2018-09-26 MED ORDER — ACETAMINOPHEN 500 MG PO TABS
1000.0000 mg | ORAL_TABLET | Freq: Four times a day (QID) | ORAL | Status: DC
  Administered 2018-09-26 – 2018-09-27 (×3): 1000 mg via ORAL
  Filled 2018-09-26 (×4): qty 2

## 2018-09-26 MED ORDER — BISACODYL 10 MG RE SUPP: 10.0000 mg | Freq: Every day | RECTAL | Status: DC | PRN

## 2018-09-26 MED ORDER — ONDANSETRON HCL 4 MG/2ML IJ SOLN: 4.0000 mg | Freq: Once | INTRAMUSCULAR | Status: DC | PRN

## 2018-09-26 MED ORDER — TRIAMCINOLONE ACETONIDE 55 MCG/ACT NA AERO
1.0000 | INHALATION_SPRAY | Freq: Every day | NASAL | Status: DC
  Administered 2018-09-27: 1 via NASAL
  Filled 2018-09-26: qty 10.8

## 2018-09-26 MED ORDER — CEFAZOLIN SODIUM-DEXTROSE 2-4 GM/100ML-% IV SOLN
2.0000 g | Freq: Three times a day (TID) | INTRAVENOUS | Status: AC
  Administered 2018-09-26 (×2): 2 g via INTRAVENOUS
  Filled 2018-09-26 (×2): qty 100

## 2018-09-26 MED ORDER — FENTANYL CITRATE (PF) 250 MCG/5ML IJ SOLN
INTRAMUSCULAR | Status: DC | PRN
  Administered 2018-09-26 (×3): 50 ug via INTRAVENOUS

## 2018-09-26 MED ORDER — BUPIVACAINE-EPINEPHRINE (PF) 0.5% -1:200000 IJ SOLN
INTRAMUSCULAR | Status: DC | PRN
  Administered 2018-09-26: 10 mL via PERINEURAL

## 2018-09-26 MED ORDER — 0.9 % SODIUM CHLORIDE (POUR BTL) OPTIME
TOPICAL | Status: DC | PRN
  Administered 2018-09-26: 1000 mL

## 2018-09-26 MED ORDER — CYCLOBENZAPRINE HCL 10 MG PO TABS
10.0000 mg | ORAL_TABLET | Freq: Three times a day (TID) | ORAL | Status: DC | PRN
  Administered 2018-09-26: 11:00:00 10 mg via ORAL
  Filled 2018-09-26: qty 1

## 2018-09-26 MED ORDER — THROMBIN 5000 UNITS EX SOLR
OROMUCOSAL | Status: DC | PRN
  Administered 2018-09-26: 08:00:00 via TOPICAL

## 2018-09-26 MED ORDER — CYCLOBENZAPRINE HCL 10 MG PO TABS
ORAL_TABLET | ORAL | Status: AC
  Filled 2018-09-26: qty 1

## 2018-09-26 MED ORDER — OXYCODONE HCL 5 MG/5ML PO SOLN: 5.0000 mg | Freq: Once | ORAL | Status: AC | PRN

## 2018-09-26 MED ORDER — MONTELUKAST SODIUM 10 MG PO TABS
10.0000 mg | ORAL_TABLET | Freq: Every day | ORAL | Status: DC
  Administered 2018-09-27: 09:00:00 10 mg via ORAL
  Filled 2018-09-26: qty 1

## 2018-09-26 MED ORDER — VANCOMYCIN HCL IN DEXTROSE 1-5 GM/200ML-% IV SOLN
1000.0000 mg | INTRAVENOUS | Status: AC
  Administered 2018-09-26: 07:00:00 1000 mg via INTRAVENOUS

## 2018-09-26 MED ORDER — SODIUM CHLORIDE 0.9 % IV SOLN
INTRAVENOUS | Status: DC | PRN
  Administered 2018-09-26: 08:00:00

## 2018-09-26 MED ORDER — SODIUM CHLORIDE 0.9% FLUSH: 3.0000 mL | INTRAVENOUS | Status: DC | PRN

## 2018-09-26 MED ORDER — FENTANYL CITRATE (PF) 100 MCG/2ML IJ SOLN
25.0000 ug | INTRAMUSCULAR | Status: DC | PRN
  Administered 2018-09-26 (×3): 50 ug via INTRAVENOUS

## 2018-09-26 MED ORDER — ONDANSETRON HCL 4 MG/2ML IJ SOLN: 4.0000 mg | Freq: Four times a day (QID) | INTRAMUSCULAR | Status: DC | PRN

## 2018-09-26 MED ORDER — MORPHINE SULFATE (PF) 4 MG/ML IV SOLN
4.0000 mg | INTRAVENOUS | Status: DC | PRN
  Administered 2018-09-26 (×2): 4 mg via INTRAVENOUS
  Filled 2018-09-26: qty 1

## 2018-09-26 MED ORDER — VITAMIN D 25 MCG (1000 UNIT) PO TABS
2000.0000 [IU] | ORAL_TABLET | Freq: Two times a day (BID) | ORAL | Status: DC
  Administered 2018-09-26 – 2018-09-27 (×2): 2000 [IU] via ORAL
  Filled 2018-09-26 (×2): qty 2

## 2018-09-26 MED ORDER — BUPIVACAINE-EPINEPHRINE (PF) 0.5% -1:200000 IJ SOLN
INTRAMUSCULAR | Status: AC
  Filled 2018-09-26: qty 30

## 2018-09-26 MED ORDER — ACETAMINOPHEN 325 MG PO TABS: 650.0000 mg | ORAL_TABLET | ORAL | Status: DC | PRN

## 2018-09-26 MED ORDER — OXYCODONE HCL 5 MG PO TABS
5.0000 mg | ORAL_TABLET | Freq: Once | ORAL | Status: AC | PRN
  Administered 2018-09-26: 11:00:00 5 mg via ORAL

## 2018-09-26 MED ORDER — PROPOFOL 10 MG/ML IV BOLUS
INTRAVENOUS | Status: AC
  Filled 2018-09-26: qty 40

## 2018-09-26 MED ORDER — BACITRACIN ZINC 500 UNIT/GM EX OINT
TOPICAL_OINTMENT | CUTANEOUS | Status: DC | PRN
  Administered 2018-09-26: 1 via TOPICAL

## 2018-09-26 MED ORDER — ACETAMINOPHEN 650 MG RE SUPP: 650.0000 mg | RECTAL | Status: DC | PRN

## 2018-09-26 SURGICAL SUPPLY — 64 items
BAG DECANTER FOR FLEXI CONT (MISCELLANEOUS) ×3 IMPLANT
BENZOIN TINCTURE PRP APPL 2/3 (GAUZE/BANDAGES/DRESSINGS) ×3 IMPLANT
BLADE CLIPPER SURG (BLADE) IMPLANT
BUR MATCHSTICK NEURO 3.0 LAGG (BURR) ×3 IMPLANT
BUR PRECISION FLUTE 6.0 (BURR) ×3 IMPLANT
CAGE ALTERA 10X31X9-13 15D (Cage) ×1 IMPLANT
CAGE ALTERA 9-13-15-31MM (Cage) ×1 IMPLANT
CANISTER SUCT 3000ML PPV (MISCELLANEOUS) ×3 IMPLANT
CAP REVERE LOCKING (Cap) ×8 IMPLANT
CARTRIDGE OIL MAESTRO DRILL (MISCELLANEOUS) ×1 IMPLANT
CLOSURE STERI-STRIP 1/4X4 (GAUZE/BANDAGES/DRESSINGS) ×2 IMPLANT
CLOSURE WOUND 1/2 X4 (GAUZE/BANDAGES/DRESSINGS) ×1
CONT SPEC 4OZ CLIKSEAL STRL BL (MISCELLANEOUS) ×3 IMPLANT
COVER BACK TABLE 60X90IN (DRAPES) ×3 IMPLANT
COVER WAND RF STERILE (DRAPES) ×3 IMPLANT
DECANTER SPIKE VIAL GLASS SM (MISCELLANEOUS) ×3 IMPLANT
DIFFUSER DRILL AIR PNEUMATIC (MISCELLANEOUS) ×3 IMPLANT
DRAPE C-ARM 42X72 X-RAY (DRAPES) ×6 IMPLANT
DRAPE HALF SHEET 40X57 (DRAPES) ×3 IMPLANT
DRAPE LAPAROTOMY 100X72X124 (DRAPES) ×3 IMPLANT
DRAPE SURG 17X23 STRL (DRAPES) ×12 IMPLANT
DRSG OPSITE POSTOP 4X6 (GAUZE/BANDAGES/DRESSINGS) ×2 IMPLANT
ELECT BLADE 4.0 EZ CLEAN MEGAD (MISCELLANEOUS) ×3
ELECT REM PT RETURN 9FT ADLT (ELECTROSURGICAL) ×3
ELECTRODE BLDE 4.0 EZ CLN MEGD (MISCELLANEOUS) ×1 IMPLANT
ELECTRODE REM PT RTRN 9FT ADLT (ELECTROSURGICAL) ×1 IMPLANT
EVACUATOR 1/8 PVC DRAIN (DRAIN) IMPLANT
GAUZE 4X4 16PLY RFD (DISPOSABLE) ×3 IMPLANT
GAUZE SPONGE 4X4 12PLY STRL (GAUZE/BANDAGES/DRESSINGS) ×3 IMPLANT
GLOVE BIO SURGEON STRL SZ8 (GLOVE) ×6 IMPLANT
GLOVE BIO SURGEON STRL SZ8.5 (GLOVE) ×6 IMPLANT
GLOVE EXAM NITRILE XL STR (GLOVE) IMPLANT
GOWN STRL REUS W/ TWL LRG LVL3 (GOWN DISPOSABLE) IMPLANT
GOWN STRL REUS W/ TWL XL LVL3 (GOWN DISPOSABLE) ×2 IMPLANT
GOWN STRL REUS W/TWL 2XL LVL3 (GOWN DISPOSABLE) IMPLANT
GOWN STRL REUS W/TWL LRG LVL3 (GOWN DISPOSABLE)
GOWN STRL REUS W/TWL XL LVL3 (GOWN DISPOSABLE) ×4
HEMOSTAT POWDER KIT SURGIFOAM (HEMOSTASIS) ×3 IMPLANT
KIT BASIN OR (CUSTOM PROCEDURE TRAY) ×3 IMPLANT
KIT TURNOVER KIT B (KITS) ×3 IMPLANT
MILL MEDIUM DISP (BLADE) ×3 IMPLANT
NDL HYPO 21X1.5 SAFETY (NEEDLE) IMPLANT
NEEDLE HYPO 21X1.5 SAFETY (NEEDLE) IMPLANT
NEEDLE HYPO 22GX1.5 SAFETY (NEEDLE) ×3 IMPLANT
NS IRRIG 1000ML POUR BTL (IV SOLUTION) ×3 IMPLANT
OIL CARTRIDGE MAESTRO DRILL (MISCELLANEOUS) ×3
PACK LAMINECTOMY NEURO (CUSTOM PROCEDURE TRAY) ×3 IMPLANT
PAD ARMBOARD 7.5X6 YLW CONV (MISCELLANEOUS) ×9 IMPLANT
PATTIES SURGICAL .5 X1 (DISPOSABLE) IMPLANT
PUTTY DBM 5CC CALC GRAN ×4 IMPLANT
ROD STRAIGHT 6.35X40MM (Rod) ×4 IMPLANT
SCREW REVERE 6.5X50MM (Screw) ×8 IMPLANT
SPONGE LAP 4X18 RFD (DISPOSABLE) IMPLANT
SPONGE NEURO XRAY DETECT 1X3 (DISPOSABLE) IMPLANT
SPONGE SURGIFOAM ABS GEL 100 (HEMOSTASIS) IMPLANT
STRIP CLOSURE SKIN 1/2X4 (GAUZE/BANDAGES/DRESSINGS) ×2 IMPLANT
SUT VIC AB 1 CT1 18XBRD ANBCTR (SUTURE) ×2 IMPLANT
SUT VIC AB 1 CT1 8-18 (SUTURE) ×2
SUT VIC AB 2-0 CP2 18 (SUTURE) ×6 IMPLANT
SYR 20CC LL (SYRINGE) IMPLANT
TOWEL GREEN STERILE (TOWEL DISPOSABLE) ×3 IMPLANT
TOWEL GREEN STERILE FF (TOWEL DISPOSABLE) ×3 IMPLANT
TRAY FOLEY MTR SLVR 16FR STAT (SET/KITS/TRAYS/PACK) ×3 IMPLANT
WATER STERILE IRR 1000ML POUR (IV SOLUTION) ×3 IMPLANT

## 2018-09-26 NOTE — Op Note (Signed)
Brief history: The patient is a 73 year old white female who is complained of back and bilateral buttock and leg pain consistent with neurogenic claudication.  She has failed medical management.  She was worked up with lumbar x-rays and lumbar MRI which demonstrated an L4-5 spondylolisthesis and spinal stenosis.  I discussed the various treatment options with the patient.  She has decided to proceed with surgery after weighing the risks, benefits and alternatives.  Preoperative diagnosis: L4-5 spondylolisthesis, degenerative disc disease, spinal stenosis compressing both the L4 and the L5 nerve roots; lumbago; lumbar radiculopathy; neurogenic claudication  Postoperative diagnosis: The same  Procedure: Bilateral L4-5 laminotomy/foraminotomies/medial facetectomy to decompress the bilateral L4 and L5 nerve roots(the work required to do this was in addition to the work required to do the posterior lumbar interbody fusion because of the patient's spinal stenosis, facet arthropathy. Etc. requiring a wide decompression of the nerve roots.);  L4-5 transforaminal lumbar interbody fusion with local morselized autograft bone and Zimmer DBM; insertion of interbody prosthesis at L4-5 (globus peek expandable interbody prosthesis); posterior nonsegmental instrumentation from L4 to L5 with globus titanium pedicle screws and rods; posterior lateral arthrodesis at L4-5 with local morselized autograft bone and Zimmer DBM.  Surgeon: Dr. Earle Gell  Asst.: Arnetha Massy nurse practitioner  Anesthesia: Gen. endotracheal  Estimated blood loss: 150 cc  Drains: None  Complications: None  Description of procedure: The patient was brought to the operating room by the anesthesia team. General endotracheal anesthesia was induced. The patient was turned to the prone position on the Wilson frame. The patient's lumbosacral region was then prepared with Betadine scrub and Betadine solution. Sterile drapes were applied.  I  then injected the area to be incised with Marcaine with epinephrine solution. I then used the scalpel to make a linear midline incision over the L4-5  interspace. I then used electrocautery to perform a bilateral subperiosteal dissection exposing the spinous process and lamina of L4 and L5. We then obtained intraoperative radiograph to confirm our location. We then inserted the Verstrac retractor to provide exposure.  I began the decompression by using the high speed drill to perform laminotomies at L4-5 bilaterally. We then used the Kerrison punches to widen the laminotomy and removed the ligamentum flavum at L4-5 bilaterally. We used the Kerrison punches to remove the medial facets at L4-5 bilaterally. We performed wide foraminotomies about the bilateral L4 and L5 nerve roots completing the decompression.  We now turned our attention to the posterior lumbar interbody fusion. I used a scalpel to incise the intervertebral disc at L4-5 bilaterally. I then performed a partial intervertebral discectomy at L4-5 bilaterally using the pituitary forceps. We prepared the vertebral endplates at L4 and L5 for the fusion by removing the soft tissues with the curettes. We then used the trial spacers to pick the appropriate sized interbody prosthesis. We prefilled his prosthesis with a combination of local morselized autograft bone that we obtained during the decompression as well as Zimmer DBM. We inserted the prefilled prosthesis into the interspace at L4-5 from the left, we then turned and expanded the prosthesis. There was a good snug fit of the prosthesis in the interspace. We then filled and the remainder of the intervertebral disc space with local morselized autograft bone and Zimmer DBM. This completed the posterior lumbar interbody arthrodesis.  We now turned attention to the instrumentation. Under fluoroscopic guidance we cannulated the bilateral L4 and L5 pedicles with the bone probe. We then removed the bone  probe. We then tapped  the pedicle with a 5.5 millimeter tap. We then removed the tap. We probed inside the tapped pedicle with a ball probe to rule out cortical breaches. We then inserted a 6.5 x 50 millimeter pedicle screw into the L4 and L5 pedicles bilaterally under fluoroscopic guidance. We then palpated along the medial aspect of the pedicles to rule out cortical breaches. There were none. The nerve roots were not injured. We then connected the unilateral pedicle screws with a lordotic rod. We compressed the construct and secured the rod in place with the caps. We then tightened the caps appropriately. This completed the instrumentation from L4-5 bilaterally.  We now turned our attention to the posterior lateral arthrodesis at L4-5 bilaterally. We used the high-speed drill to decorticate the remainder of the facets, pars, transverse process at L4-5 bilaterally. We then applied a combination of local morselized autograft bone and Zimmer DBM over these decorticated posterior lateral structures. This completed the posterior lateral arthrodesis.  We then obtained hemostasis using bipolar electrocautery. We irrigated the wound out with bacitracin solution. We inspected the thecal sac and nerve roots and noted they were well decompressed. We then removed the retractor. We placed vancomycin powder in the wound.  We injected Exparel . We reapproximated patient's thoracolumbar fascia with interrupted #1 Vicryl suture. We reapproximated patient's subcutaneous tissue with interrupted 2-0 Vicryl suture. The reapproximated patient's skin with Steri-Strips and benzoin. The wound was then coated with bacitracin ointment. A sterile dressing was applied. The drapes were removed. The patient was subsequently returned to the supine position where they were extubated by the anesthesia team. He was then transported to the post anesthesia care unit in stable condition. All sponge instrument and needle counts were reportedly  correct at the end of this case.

## 2018-09-26 NOTE — Progress Notes (Signed)
Orthopedic Tech Progress Note Patient Details:  Julia Deleon 08-Aug-1945 [id number] Patient was a pre fit Patient ID: Julia Deleon, female   DOB: [birth year], 73 y.o.   MRN: [id number]   [name] 09/26/2018, 4:12 PM

## 2018-09-26 NOTE — H&P (Signed)
Subjective: The patient is a 73 year old white female who has complained of back and left great and right leg pain consistent with neurogenic claudication.  She has failed medical management.  She was worked up with a lumbar MRI and lumbar x-rays which demonstrated a L4-5 spinal listhesis, facet arthropathy and spinal stenosis.  I discussed the various treatment options with her.  She has decided to proceed with surgery.  Past Medical History:  Diagnosis Date  . Arthritis   . COPD (chronic obstructive pulmonary disease) ([place])   . Family history of adverse reaction to anesthesia    father was slow to wake up  . Fuch's endothelial dystrophy   . Hyperlipidemia   . Hypothyroidism   . PONV (postoperative nausea and vomiting)    slow to wake up and PONV    Past Surgical History:  Procedure Laterality Date  . APPENDECTOMY    . CARDIAC CATHETERIZATION    . CHOLECYSTECTOMY    . EYE SURGERY    . FOOT SURGERY Right    pin removed left  . laser vein surgery    . NASAL SINUS SURGERY    . RIGHT/LEFT HEART CATH AND CORONARY ANGIOGRAPHY N/A 12/06/2017   Procedure: RIGHT/LEFT HEART CATH AND CORONARY ANGIOGRAPHY;  Surgeon: Yolonda Kida, MD;  Location: [facility];  Service: Cardiovascular;  Laterality: N/A;  . TONSILLECTOMY      Allergies  Allergen Reactions  . Levofloxacin Hives  . Methylisothiazolinone Other (See Comments)    Positive on allergy test  . Thimerosal Other (See Comments)    Positive on allergy test  . Amoxicillin-Pot Clavulanate Nausea Only    Did it involve swelling of the face/tongue/throat, SOB, or low BP? No Did it involve sudden or severe rash/hives, skin peeling, or any reaction on the inside of your mouth or nose? No Did you need to seek medical attention at a hospital or doctor's office? Unknown When did it last happen?unknown If all above answers are "NO", may proceed with cephalosporin use.   . Colesevelam Hcl Nausea Only    WELCHOL  . Latex  Rash    Family history of latex allergy  . Simvastatin Itching    Social History   Tobacco Use  . Smoking status: Never Smoker  . Smokeless tobacco: Never Used  Substance Use Topics  . Alcohol use: No    Alcohol/week: 0.0 standard drinks    Family History  Problem Relation Age of Onset  . Cancer Mother        lung  . CAD Mother   . Stroke Father   . Cancer Sister        breast  . Breast cancer Sister 52  . Varicose Veins Son   . Heart disease Maternal Grandmother   . Cancer Paternal Grandmother        throat  . Emphysema Paternal Grandfather   . Fibromyalgia Sister   . Breast cancer Paternal Aunt    Prior to Admission medications   Medication Sig Start Date End Date Taking? Authorizing Provider  acetaminophen (TYLENOL) 650 MG CR tablet Take 650-1,300 mg by mouth See admin instructions. Take 1300 mg in the morning, 650 mg at lunch, and 650 in the evening   Yes [provider]  Alpha-Lipoic Acid 100 MG TABS Take 100 mg by mouth daily with lunch.   Yes [provider]  azelastine (ASTELIN) 0.1 % nasal spray Place 1 spray into both nostrils daily. Use in each nostril as directed  Yes [provider]  chlorzoxazone (PARAFON) 500 MG tablet Take 1 tablet (500 mg total) by mouth 4 (four) times daily as needed for muscle spasms. Patient taking differently: Take 500 mg by mouth at bedtime as needed for muscle spasms.  09/07/18  Yes Crissman, Jeannette How, MD  Cholecalciferol (VITAMIN D3) 2000 units TABS Take 2,000 Units by mouth 2 (two) times a day.    Yes [provider]  Coenzyme Q10 (COQ-10) 100 MG CAPS Take 100 mg by mouth daily.   Yes [provider]  CRANBERRY PO Take 1 capsule by mouth daily.   Yes [provider]  Cyanocobalamin (B-12-SL) 1000 MCG SUBL Place 500 mcg under the tongue 2 (two) times a day.   Yes [provider]  DHEA 10 MG CAPS Take 10 mg by mouth daily.   Yes [provider]  estradiol (ESTRACE  VAGINAL) 0.1 MG/GM vaginal cream 0.5 twice weekly --COMPOUND PLS Patient taking differently: Place 1 Applicatorful vaginally 3 (three) times a week. 0.5 twice weekly --COMPOUND PLS 09/21/17  Yes Defrancesco, Alanda Slim, MD  fexofenadine (ALLEGRA) 60 MG tablet Take 60 mg by mouth daily.   Yes [provider]  Glucosamine-Chondroitin (GLUCOSAMINE CHONDR COMPLEX PO) Take 1 tablet by mouth 2 (two) times a day.   Yes [provider]  ibuprofen (ADVIL,MOTRIN) 200 MG tablet Take 200 mg by mouth daily with lunch.    Yes [provider]  Levomefolate Glucosamine (METHYLFOLATE) 400 MCG CAPS Take 400 mcg by mouth daily with lunch.   Yes [provider]  levothyroxine (SYNTHROID) 75 MCG tablet Take 75 mcg by mouth daily before breakfast.   Yes [provider]  losartan (COZAAR) 100 MG tablet Take 1 tablet (100 mg total) by mouth daily. 08/31/18  Yes Crissman, Jeannette How, MD  Multiple Vitamin (MULTIVITAMIN WITH MINERALS) TABS tablet Take 1 tablet by mouth every evening.   Yes [provider]  Multiple Vitamins-Minerals (LUTEIN-ZEAXANTHIN PO) Take 1 tablet by mouth at bedtime.   Yes [provider]  Probiotic CAPS Take 1 capsule by mouth at bedtime.   Yes [provider]  Progesterone Micronized (PROGESTERONE, BULK,) POWD 4% compound- topically Patient taking differently: Apply 1 application topically See admin instructions. 4% compound- topically, apply topically daily except Sundays 09/21/17  Yes Defrancesco, Alanda Slim, MD  triamcinolone (NASACORT ALLERGY 24HR) 55 MCG/ACT AERO nasal inhaler Place 2 sprays daily into the nose. Patient taking differently: Place 1 spray into the nose daily as needed.  03/28/17  Yes Crissman, Jeannette How, MD  TURMERIC PO Take 1 tablet by mouth 2 (two) times a week.   Yes [provider]  montelukast (SINGULAIR) 10 MG tablet Take 10 mg by mouth daily.    [provider]     Review of Systems  Positive  ROS: As above  All other systems have been reviewed and were otherwise negative with the exception of those mentioned in the HPI and as above.  Objective: Vital signs in last 24 hours: Temp:  [97.9 F (36.6 C)] 97.9 F (36.6 C) (05/19 0600) Pulse Rate:  [60] 60 (05/19 0600) Resp:  [18] 18 (05/19 0600) BP: (152)/(52) 152/52 (05/19 0600) SpO2:  [97 %] 97 % (05/19 0600) Weight:  [72.2 kg] 72.2 kg (05/19 0618) Estimated body mass index is 27.34 kg/m as calculated from the following:   Height as of this encounter: 5\' 4"  (1.626 m).   Weight as of this encounter: 72.2 kg.   Physical exam:  General: An alert and pleasant 73 year old white female in no apparent distress  HEENT: Normocephalic, atraumatic, extraocular muscles intact  Neck: Unremarkable  Thorax: Symmetric  Abdomen: Soft  Extremities: Unremarkable  Neurologic exam the patient is alert and oriented x3.  Her strength is grossly normal in her lower extremities.   Data Review Lab Results  Component Value Date   WBC 7.0 09/22/2018   HGB 13.0 09/22/2018   HCT 40.5 09/22/2018   MCV 96.7 09/22/2018   PLT 187 09/22/2018   Lab Results  Component Value Date   NA 138 09/22/2018   K 4.1 09/22/2018   CL 105 09/22/2018   CO2 25 09/22/2018   BUN 20 09/22/2018   CREATININE 1.28 (H) 09/22/2018   GLUCOSE 152 (H) 09/22/2018   No results found for: INR, PROTIME  Assessment/Plan: L4-5 spondylolisthesis, spinal stenosis, facet arthropathy, lumbago, lumbar radiculopathy, neurogenic claudication: I have discussed the situation with the patient.  I have reviewed her imaging studies with her and pointed out the abnormalities.  We have discussed the various treatment options including surgery.  I have described the surgical treatment option of an L4-5 decompression, instrumentation, and fusion.  I have shown her surgical models.  I have given her a surgical pamphlet.  We have discussed the risks, benefits, alternatives, expected  postoperative course, and likelihood of achieving our goals with surgery.  I have answered all her questions.  She has decided to proceed with surgery.   [name] 09/26/2018 7:20 AM

## 2018-09-26 NOTE — Progress Notes (Signed)
Has been on the phone constantly since last dose of pain meds

## 2018-09-26 NOTE — Transfer of Care (Signed)
Immediate Anesthesia Transfer of Care Note  Patient: Julia Deleon  Procedure(s) Performed: POSTERIOR LUMBAR INTERBODY FUSION LUMBAR FOUR- LUMBAR FIVE, POSTERIOR INSTRUMENTATION (N/A )  Patient Location: PACU  Anesthesia Type:General  Level of Consciousness: awake, alert , patient cooperative and responds to stimulation  Airway & Oxygen Therapy: Patient Spontanous Breathing and Patient connected to face mask oxygen  Post-op Assessment: Report given to RN, Post -op Vital signs reviewed and stable and Patient moving all extremities X 4  Post vital signs: Reviewed and stable  Last Vitals:  Vitals Value Taken Time  BP    Temp    Pulse 72 09/26/2018 10:43 AM  Resp 13 09/26/2018 10:43 AM  SpO2 99 % 09/26/2018 10:43 AM  Vitals shown include unvalidated device data.  Last Pain:  Vitals:   09/26/18 0618  TempSrc:   PainSc: 3       Patients Stated Pain Goal: 3 (92/49/32 4199)  Complications: No apparent anesthesia complications

## 2018-09-26 NOTE — Anesthesia Postprocedure Evaluation (Signed)
Anesthesia Post Note  Patient: Danecia C Choy  Procedure(s) Performed: POSTERIOR LUMBAR INTERBODY FUSION LUMBAR FOUR- LUMBAR FIVE, POSTERIOR INSTRUMENTATION (N/A )     Patient location during evaluation: PACU Anesthesia Type: General Level of consciousness: awake and alert Pain management: pain level controlled Vital Signs Assessment: post-procedure vital signs reviewed and stable Respiratory status: spontaneous breathing, nonlabored ventilation, respiratory function stable and patient connected to nasal cannula oxygen Cardiovascular status: blood pressure returned to baseline and stable Postop Assessment: no apparent nausea or vomiting Anesthetic complications: no    Last Vitals:  Vitals:   09/26/18 1300 09/26/18 1500  BP:  (!) 141/50  Pulse: 72 72  Resp: 18   Temp:  36.5 C  SpO2: 98% 99%    Last Pain:  Vitals:   09/26/18 1500  TempSrc: Oral  PainSc:                  Nollan Muldrow COKER

## 2018-09-26 NOTE — Anesthesia Procedure Notes (Signed)
Procedure Name: Intubation Date/Time: 09/26/2018 7:36 AM Performed by: Glynda Jaeger, CRNA Pre-anesthesia Checklist: Patient identified, Patient being monitored, Timeout performed, Emergency Drugs available and Suction available Patient Re-evaluated:Patient Re-evaluated prior to induction Oxygen Delivery Method: Circle System Utilized Preoxygenation: Pre-oxygenation with 100% oxygen Induction Type: IV induction Laryngoscope Size: Mac and 3 Grade View: Grade I Tube type: Oral Nasal Tubes: Right Tube size: 7.0 mm Number of attempts: 1 Airway Equipment and Method: Stylet Placement Confirmation: ETT inserted through vocal cords under direct vision,  positive ETCO2 and breath sounds checked- equal and bilateral Secured at: 21 cm Tube secured with: Tape Dental Injury: Teeth and Oropharynx as per pre-operative assessment  Comments: RSI

## 2018-09-26 NOTE — Progress Notes (Signed)
PACU : Ate 955 of meal /walked 450 feet with standby assist and tolerated well/ placed in bed in room 16, given po pain meds and call bell within reach. Currently OFF all monitors x pulse ox.Rheumatoid Arthritis sat97 / hr 80 / drsg cdi

## 2018-09-26 NOTE — Progress Notes (Signed)
Subjective: The patient is alert and pleasant.  She looks well.  Objective: Vital signs in last 24 hours: Temp:  [97.9 F (36.6 C)] 97.9 F (36.6 C) (05/19 0600) Pulse Rate:  [60] 60 (05/19 0600) Resp:  [18] 18 (05/19 0600) BP: (152)/(52) 152/52 (05/19 0600) SpO2:  [97 %] 97 % (05/19 0600) Weight:  [72.2 kg] 72.2 kg (05/19 0618) Estimated body mass index is 27.34 kg/m as calculated from the following:   Height as of this encounter: 5\' 4"  (1.626 m).   Weight as of this encounter: 72.2 kg.   Intake/Output from previous day: No intake/output data recorded. Intake/Output this shift: Total I/O In: 800 [I.V.:800] Out: 325 [Urine:300; Blood:25]  Physical exam the patient is alert and pleasant.  She is moving her lower extremities well.  Lab Results: No results for input(s): WBC, HGB, HCT, PLT in the last 72 hours. BMET No results for input(s): NA, K, CL, CO2, GLUCOSE, BUN, CREATININE, CALCIUM in the last 72 hours.  Studies/Results: No results found.  Assessment/Plan: The patient is doing well.  I spoke with her husband.  LOS: 0 days     [name] 09/26/2018, 10:49 AM

## 2018-09-27 ENCOUNTER — Ambulatory Visit: Payer: Self-pay | Admitting: Obstetrics & Gynecology

## 2018-09-27 ENCOUNTER — Encounter: Payer: Medicare Other | Admitting: Obstetrics and Gynecology

## 2018-09-27 DIAGNOSIS — J449 Chronic obstructive pulmonary disease, unspecified: Secondary | ICD-10-CM | POA: Diagnosis not present

## 2018-09-27 DIAGNOSIS — M5416 Radiculopathy, lumbar region: Secondary | ICD-10-CM | POA: Diagnosis not present

## 2018-09-27 DIAGNOSIS — M48062 Spinal stenosis, lumbar region with neurogenic claudication: Secondary | ICD-10-CM | POA: Diagnosis not present

## 2018-09-27 DIAGNOSIS — M4316 Spondylolisthesis, lumbar region: Secondary | ICD-10-CM | POA: Diagnosis not present

## 2018-09-27 DIAGNOSIS — E039 Hypothyroidism, unspecified: Secondary | ICD-10-CM | POA: Diagnosis not present

## 2018-09-27 DIAGNOSIS — Z79899 Other long term (current) drug therapy: Secondary | ICD-10-CM | POA: Diagnosis not present

## 2018-09-27 LAB — CBC
HCT: 32.3 % — ABNORMAL LOW (ref 36.0–46.0)
Hemoglobin: 10.6 g/dL — ABNORMAL LOW (ref 12.0–15.0)
MCH: 31 pg (ref 26.0–34.0)
MCHC: 32.8 g/dL (ref 30.0–36.0)
MCV: 94.4 fL (ref 80.0–100.0)
Platelets: 169 K/uL (ref 150–400)
RBC: 3.42 MIL/uL — ABNORMAL LOW (ref 3.87–5.11)
RDW: 13.5 % (ref 11.5–15.5)
WBC: 9 K/uL (ref 4.0–10.5)
nRBC: 0 % (ref 0.0–0.2)

## 2018-09-27 LAB — BASIC METABOLIC PANEL
Anion gap: 9 (ref 5–15)
BUN: 27 mg/dL — ABNORMAL HIGH (ref 8–23)
CO2: 23 mmol/L (ref 22–32)
Calcium: 8.6 mg/dL — ABNORMAL LOW (ref 8.9–10.3)
Chloride: 106 mmol/L (ref 98–111)
Creatinine, Ser: 1.23 mg/dL — ABNORMAL HIGH (ref 0.44–1.00)
GFR calc Af Amer: 51 mL/min — ABNORMAL LOW (ref >60)
GFR calc non Af Amer: 44 mL/min — ABNORMAL LOW (ref >60)
Glucose, Bld: 123 mg/dL — ABNORMAL HIGH (ref 70–99)
Potassium: 4.2 mmol/L (ref 3.5–5.1)
Sodium: 138 mmol/L (ref 135–145)

## 2018-09-27 MED ORDER — DOCUSATE SODIUM 100 MG PO CAPS: 100.0000 mg | ORAL_CAPSULE | Freq: Two times a day (BID) | ORAL | 0 refills | Status: DC

## 2018-09-27 MED ORDER — OXYCODONE HCL 5 MG PO TABS: 5.0000 mg | ORAL_TABLET | ORAL | 0 refills | Status: DC | PRN

## 2018-09-27 NOTE — Care Management CC44 (Signed)
Condition Code 44 Documentation Completed  Patient Details  Name: Julia Deleon MRN: [id number] Date of Birth: 1945/06/26   Condition Code 44 given:  Yes Patient signature on Condition Code 44 notice:  Yes Documentation of 2 MD's agreement:  Yes Code 44 added to claim:  Yes    Pollie Friar, RN 09/27/2018, 12:08 PM

## 2018-09-27 NOTE — Evaluation (Signed)
Physical Therapy Evaluation Patient Details Name: Julia Deleon MRN: [id number] DOB: 01-31-1946 Today's Date: 09/27/2018   History of Present Illness  Patient is a 73 y/o female s/p PLIF L4-5 on 09/26/2018. PMH significant of COPD, hyperlipidemia, hypothyroidism.  Clinical Impression  Patient admitted s/p above listed procedure. Patient requiring Min guard for all mobility with cueing for back precautions and log rolling with good tolerance. Ambulating in hallway and up/down 10 steps with mild instability - required use of RW for stability. Will follow acutely to maximize safe and independent functional mobility prior to return home.     Follow Up Recommendations Supervision for mobility/OOB;No PT follow up    Equipment Recommendations  Rolling walker with 5" wheels    Recommendations for Other Services       Precautions / Restrictions Precautions Precautions: Back Precaution Comments: reviewed back precautions; handout given Required Braces or Orthoses: Spinal Brace Spinal Brace: Lumbar corset;Applied in sitting position Restrictions Weight Bearing Restrictions: No      Mobility  Bed Mobility Overal bed mobility: Needs Assistance Bed Mobility: Rolling;Sidelying to Sit Rolling: Min guard Sidelying to sit: Min guard       General bed mobility comments: cueing on log rolling technique to maintain back precautions  Transfers Overall transfer level: Needs assistance Equipment used: Rolling walker (2 wheeled) Transfers: Sit to/from Stand Sit to Stand: Min guard         General transfer comment: for safety; cueing for hand placement  Ambulation/Gait Ambulation/Gait assistance: Min guard Gait Distance (Feet): 300 Feet Assistive device: Rolling walker (2 wheeled) Gait Pattern/deviations: Step-through pattern;Decreased stride length Gait velocity: decreased   General Gait Details: required use of RW for stability; reports mild back pain with mobility  Stairs Stairs:  Yes Stairs assistance: Min guard Stair Management: One rail Right;Step to pattern;Forwards Number of Stairs: 10    Wheelchair Mobility    Modified Rankin (Stroke Patients Only)       Balance Overall balance assessment: Needs assistance Sitting-balance support: No upper extremity supported;Feet supported Sitting balance-Leahy Scale: Fair     Standing balance support: Bilateral upper extremity supported;During functional activity Standing balance-Leahy Scale: Fair                               Pertinent Vitals/Pain Pain Assessment: Faces Faces Pain Scale: Hurts little more Pain Location: back Pain Descriptors / Indicators: Aching;Discomfort;Guarding Pain Intervention(s): Limited activity within patient's tolerance;Monitored during session;Repositioned    Home Living Family/patient expects to be discharged to:: Private residence Living Arrangements: Spouse/significant other Available Help at Discharge: Family;Available 24 hours/day Type of Home: House Home Access: Stairs to enter   [employer] of Steps: 1 Home Layout: Two level Home Equipment: Cane - single point      Prior Function Level of Independence: Independent               Hand Dominance        Extremity/Trunk Assessment   Upper Extremity Assessment Upper Extremity Assessment: Defer to OT evaluation    Lower Extremity Assessment Lower Extremity Assessment: Generalized weakness    Cervical / Trunk Assessment Cervical / Trunk Assessment: Normal  Communication   Communication: No difficulties  Cognition Arousal/Alertness: Awake/alert Behavior During Therapy: WFL for tasks assessed/performed Overall Cognitive Status: Within Functional Limits for tasks assessed  General Comments      Exercises     Assessment/Plan    PT Assessment Patient needs continued PT services  PT Problem List Decreased  strength;Decreased activity tolerance;Decreased balance;Decreased mobility;Decreased knowledge of use of DME;Decreased safety awareness       PT Treatment Interventions DME instruction;Gait training;Functional mobility training;Stair training;Therapeutic activities;Therapeutic exercise;Balance training;Patient/family education    PT Goals (Current goals can be found in the Care Plan section)  Acute Rehab PT Goals Patient Stated Goal: return home soon PT Goal Formulation: With patient Time For Goal Achievement: 10/11/18 Potential to Achieve Goals: Good    Frequency Min 5X/week   Barriers to discharge        Co-evaluation               AM-PAC PT "6 Clicks" Mobility  Outcome Measure Help needed turning from your back to your side while in a flat bed without using bedrails?: A Little Help needed moving from lying on your back to sitting on the side of a flat bed without using bedrails?: A Little Help needed moving to and from a bed to a chair (including a wheelchair)?: A Little Help needed standing up from a chair using your arms (e.g., wheelchair or bedside chair)?: A Little Help needed to walk in hospital room?: A Little Help needed climbing 3-5 steps with a railing? : A Little 6 Click Score: 18    End of Session Equipment Utilized During Treatment: Gait belt;Back brace Activity Tolerance: Patient tolerated treatment well Patient left: in chair;with call bell/phone within reach Nurse Communication: Mobility status PT Visit Diagnosis: Unsteadiness on feet (R26.81);Other abnormalities of gait and mobility (R26.89);Muscle weakness (generalized) (M62.81)    Time: 7078-6754 PT Time Calculation (min) (ACUTE ONLY): 32 min   Charges:   PT Evaluation $PT Eval Moderate Complexity: 1 Mod PT Treatments $Gait Training: 8-22 mins        Lanney Gins, PT, DPT Supplemental Physical Therapist 09/27/18 9:44 AM Pager: [phone] Office: [phone]

## 2018-09-27 NOTE — Discharge Summary (Signed)
Physician Discharge Summary  Patient ID: Julia Deleon MRN: [id number] DOB/AGE: 05-14-1945 73 y.o.  Admit date: 09/26/2018 Discharge date: 09/27/2018  Admission Diagnoses: Lumbar spondylolisthesis, lumbar spinal stenosis, lumbar facet arthropathy, lumbago, lumbar radiculopathy, neurogenic location  Discharge Diagnoses: The same Active Problems:   Spondylolisthesis, lumbar region   Discharged Condition: good  Hospital Course: I performed an L4-5 decompression, instrumentation and fusion on patient on 09/26/2018.  The surgery went well.  The patient's postoperative course was unremarkable.  On postoperative day #1 she requested discharge to home.  She was given written and oral discharge instructions.  All her questions were answered.  Consults: Physical therapy, occupational therapy, care management Significant Diagnostic Studies: None Treatments: L4-5 decompression, instrumentation and fusion. Discharge Exam: Blood pressure (!) 105/46, pulse 65, temperature 98.9 F (37.2 C), temperature source Oral, resp. rate 14, height 5\' 4"  (1.626 m), weight 72.2 kg, SpO2 96 %. The patient is alert and pleasant.  She looks well.  She is moving her lower extremities well.  Disposition: Home  Discharge Instructions    Call MD for:  difficulty breathing, headache or visual disturbances   Complete by:  As directed    Call MD for:  extreme fatigue   Complete by:  As directed    Call MD for:  hives   Complete by:  As directed    Call MD for:  persistant dizziness or light-headedness   Complete by:  As directed    Call MD for:  persistant nausea and vomiting   Complete by:  As directed    Call MD for:  redness, tenderness, or signs of infection (pain, swelling, redness, odor or green/yellow discharge around incision site)   Complete by:  As directed    Call MD for:  severe uncontrolled pain   Complete by:  As directed    Call MD for:  temperature >100.4   Complete by:  As directed    Diet - low  sodium heart healthy   Complete by:  As directed    Discharge instructions   Complete by:  As directed    Call [phone] for a followup appointment. Take a stool softener while you are using pain medications.   Driving Restrictions   Complete by:  As directed    Do not drive for 2 weeks.   Increase activity slowly   Complete by:  As directed    Lifting restrictions   Complete by:  As directed    Do not lift more than 5 pounds. No excessive bending or twisting.   May shower / Bathe   Complete by:  As directed    Remove the dressing for 3 days after surgery.  You may shower, but leave the incision alone.   Remove dressing in 48 hours   Complete by:  As directed    Your stitches are under the scan and will dissolve by themselves. The Steri-Strips will fall off after you take a few showers. Do not rub back or pick at the wound, Leave the wound alone.     Allergies as of 09/27/2018      Reactions   Levofloxacin Hives   Methylisothiazolinone Other (See Comments)   Positive on allergy test   Thimerosal Other (See Comments)   Positive on allergy test   Amoxicillin-pot Clavulanate Nausea Only   Did it involve swelling of the face/tongue/throat, SOB, or low BP? No Did it involve sudden or severe rash/hives, skin peeling, or any reaction on the inside of your  mouth or nose? No Did you need to seek medical attention at a hospital or doctor's office? Unknown When did it last happen?unknown If all above answers are "NO", may proceed with cephalosporin use.   Colesevelam Hcl Nausea Only   WELCHOL   Latex Rash   Family history of latex allergy   Simvastatin Itching      Medication List    STOP taking these medications   ibuprofen 200 MG tablet Commonly known as:  ADVIL     TAKE these medications   acetaminophen 650 MG CR tablet Commonly known as:  TYLENOL Take 650-1,300 mg by mouth See admin instructions. Take 1300 mg in the morning, 650 mg at lunch, and 650 in the  evening   Alpha-Lipoic Acid 100 MG Tabs Take 100 mg by mouth daily with lunch.   azelastine 0.1 % nasal spray Commonly known as:  ASTELIN Place 1 spray into both nostrils daily. Use in each nostril as directed   B-12-SL 1000 MCG Subl Generic drug:  Cyanocobalamin Place 500 mcg under the tongue 2 (two) times a day.   chlorzoxazone 500 MG tablet Commonly known as:  PARAFON Take 1 tablet (500 mg total) by mouth 4 (four) times daily as needed for muscle spasms. What changed:  when to take this   CoQ-10 100 MG Caps Take 100 mg by mouth daily.   CRANBERRY PO Take 1 capsule by mouth daily.   DHEA 10 MG Caps Take 10 mg by mouth daily.   docusate sodium 100 MG capsule Commonly known as:  COLACE Take 1 capsule (100 mg total) by mouth 2 (two) times daily.   estradiol 0.1 MG/GM vaginal cream Commonly known as:  ESTRACE VAGINAL 0.5 twice weekly --COMPOUND PLS What changed:    how much to take  how to take this  when to take this   fexofenadine 60 MG tablet Commonly known as:  ALLEGRA Take 60 mg by mouth daily.   GLUCOSAMINE CHONDR COMPLEX PO Take 1 tablet by mouth 2 (two) times a day.   levothyroxine 75 MCG tablet Commonly known as:  SYNTHROID Take 75 mcg by mouth daily before breakfast.   losartan 100 MG tablet Commonly known as:  COZAAR Take 1 tablet (100 mg total) by mouth daily.   LUTEIN-ZEAXANTHIN PO Take 1 tablet by mouth at bedtime.   MethylFolate 400 MCG Caps Generic drug:  Levomefolate Glucosamine Take 400 mcg by mouth daily with lunch.   montelukast 10 MG tablet Commonly known as:  SINGULAIR Take 10 mg by mouth daily.   multivitamin with minerals Tabs tablet Take 1 tablet by mouth every evening.   oxyCODONE 5 MG immediate release tablet Commonly known as:  Oxy IR/ROXICODONE Take 1 tablet (5 mg total) by mouth every 4 (four) hours as needed for moderate pain ((score 4 to 6)).   Probiotic Caps Take 1 capsule by mouth at bedtime.   progesterone  (bulk) Powd 4% compound- topically What changed:    how much to take  how to take this  when to take this  additional instructions   triamcinolone 55 MCG/ACT Aero nasal inhaler Commonly known as:  Nasacort Allergy 24HR Place 2 sprays daily into the nose. What changed:    how much to take  when to take this  reasons to take this   TURMERIC PO Take 1 tablet by mouth 2 (two) times a week.   Vitamin D3 50 MCG (2000 UT) Tabs Take 2,000 Units by mouth 2 (two) times a  day.        Signed: [name] 09/27/2018, 9:52 AM

## 2018-09-27 NOTE — TOC Initial Note (Signed)
Transition of Care [facility]) - Initial/Assessment Note    Patient Details  Name: Julia Deleon MRN: [id number] Date of Birth: Jun 06, 1945  [facility]) CM/SW Contact:    Pollie Friar, RN Phone Number: 09/27/2018, 12:25 PM  Clinical Narrative:                   Expected Discharge Plan: Home/Self Care Barriers to Discharge: Barriers Resolved   Patient Goals and CMS Choice     Choice offered to / list presented to : Patient  Expected Discharge Plan and Services Expected Discharge Plan: Home/Self Care   Discharge Planning Services: CM Consult Post Acute Care Choice: Durable Medical Equipment   Expected Discharge Date: 09/27/18               DME Arranged: Gilford Rile rolling DME Agency: [employer] Date DME Agency Contacted: 09/27/18 Time DME Agency Contacted: 89 Representative spoke with at DME Agency: [place]            Prior Living Arrangements/Services   Lives with:: Spouse Patient language and need for interpreter reviewed:: No Do you feel safe going back to the place where you live?: Yes      Need for Family Participation in Patient Care: Yes (Comment) Care giver support system in place?: Yes (comment)(Spouse able to provide supervision)   Criminal Activity/Legal Involvement Pertinent to Current Situation/Hospitalization: No - Comment as needed  Activities of Daily Living Home Assistive Devices/Equipment: None ADL Screening (condition at time of admission) Patient's cognitive ability adequate to safely complete daily activities?: Yes Is the patient deaf or have difficulty hearing?: No Does the patient have difficulty seeing, even when wearing glasses/contacts?: No Does the patient have difficulty concentrating, remembering, or making decisions?: No Patient able to express need for assistance with ADLs?: Yes Does the patient have difficulty dressing or bathing?: No Independently performs ADLs?: Yes (appropriate for developmental age) Does the patient have  difficulty walking or climbing stairs?: No Weakness of Legs: Both Weakness of Arms/Hands: None  Permission Sought/Granted                  Emotional Assessment Appearance:: Appears stated age Attitude/Demeanor/Rapport: Engaged Affect (typically observed): Accepting, Appropriate, Pleasant Orientation: : Oriented to Self, Oriented to Place, Oriented to  Time, Oriented to Situation   Psych Involvement: No (comment)  Admission diagnosis:  SPONDYLOLISTHESIS, LUMBAR REGION Patient Active Problem List   Diagnosis Date Noted  . Spondylolisthesis, lumbar region 09/26/2018  . Aortic atherosclerosis ([place]) 08/31/2018  . CAD (coronary artery disease) 02/23/2018  . Fatigue 01/20/2018  . Short of breath on exertion 10/17/2017  . Advanced care planning/counseling discussion 03/28/2017  . Varicose veins of both lower extremities with complications 43/32/9518  . Arthritis 12/09/2016  . Vaginal atrophy 09/10/2015  . Perimenopausal vasomotor symptoms 09/10/2015  . Menopause 09/10/2015  . Onychomycosis due to dermatophyte 04/07/2015  . Bursitis of right shoulder 02/26/2015  . Environmental and seasonal allergies 02/26/2015  . Other allergic rhinitis 02/26/2015  . Dysuria 12/25/2014  . Sciatica 11/27/2014  . Osteoporosis 11/26/2014  . COPD (chronic obstructive pulmonary disease) ([place]) 11/26/2014  . Fuchs' corneal dystrophy 11/26/2014  . Essential (primary) hypertension 11/26/2014  . Hyperlipidemia 11/26/2014  . Chronic kidney disease, stage III (moderate) ([place]) 11/26/2014  . Hypothyroidism 11/26/2014  . Low back pain 11/26/2014  . Endothelial corneal dystrophy 11/26/2014   PCP:  Guadalupe Maple, MD Pharmacy:   [facility], [place]  [place] [address] Phone: [phone] Fax: [facility] [facility], [place] [address] [place] [address] Phone: [phone] Fax:  [place], [place] [address] [place] [address] Phone: [phone] Fax: [phone]  [place], [place] - [address] [place] [address] Phone: [phone] Fax: [phone]     Social Determinants of Health (SDOH) Interventions    Readmission Risk Interventions No flowsheet data found.

## 2018-09-27 NOTE — TOC Transition Note (Signed)
Transition of Care [facility]) - CM/SW Discharge Note   Patient Details  Name: Julia Deleon MRN: [id number] Date of Birth: March 13, 1946  [facility]) CM/SW Contact:  Pollie Friar, RN Phone Number: 09/27/2018, 12:25 PM   Clinical Narrative:    Pt discharging home. Pt has transportation. Denies issues with home meds. Has hospital f/u.    Final next level of care: Home/Self Care Barriers to Discharge: Barriers Resolved   Patient Goals and CMS Choice     Choice offered to / list presented to : Patient  Discharge Placement                       Discharge Plan and Services   Discharge Planning Services: CM Consult Post Acute Care Choice: Durable Medical Equipment          DME Arranged: [place] rolling DME Agency: [employer] Date DME Agency Contacted: 09/27/18 Time DME Agency Contacted: 72 Representative spoke with at DME Agency: [place] ([place]) Interventions     Readmission Risk Interventions No flowsheet data found.

## 2018-09-27 NOTE — Care Management Obs Status (Signed)
[place] NOTIFICATION   Patient Details  Name: Marcia C Pryce MRN: [id number] Date of Birth: 1945/09/03   Medicare Observation Status Notification Given:  Yes    Pollie Friar, RN 09/27/2018, 12:08 PM

## 2018-09-27 NOTE — Progress Notes (Signed)
Occupational Therapy Treatment Patient Details Name: Julia Deleon MRN: [id number] DOB: Nov 21, 1945 Today's Date: 09/27/2018    History of present illness Patient is a 73 y/o female s/p PLIF L4-5 on 09/26/2018. PMH significant of COPD, hyperlipidemia, hypothyroidism.   OT comments  Returned to see for tub transfer training.  Educated on reverse step with min guard assist over threshold, cueing for technique and provided written instructions for home. Requires setup for UB ADLs and min assist for LB ADLs due to increased fatigue and back pain.  Agreeable to have support at dc with transfers and mobility.  Will follow.    Follow Up Recommendations  No OT follow up;Supervision - Intermittent    Equipment Recommendations  None recommended by OT(plans to purchase toilet riser)    Recommendations for Other Services      Precautions / Restrictions Precautions Precautions: Back Precaution Booklet Issued: Yes (comment) Precaution Comments: reviewed back precautions; handout given; able to recall with independence, but cueing to adhere to functionally Required Braces or Orthoses: Spinal Brace Spinal Brace: Lumbar corset;Applied in sitting position Restrictions Weight Bearing Restrictions: No       Mobility Bed Mobility Overal bed mobility: Needs Assistance Bed Mobility: Rolling;Sidelying to Sit Rolling: Min guard Sidelying to sit: Min guard       General bed mobility comments: OOB in recliner upon entry  Transfers Overall transfer level: Needs assistance Equipment used: Rolling walker (2 wheeled) Transfers: Sit to/from Stand Sit to Stand: Min guard         General transfer comment: for safety; cueing for hand placement    Balance Overall balance assessment: Needs assistance Sitting-balance support: No upper extremity supported;Feet supported Sitting balance-Leahy Scale: Fair     Standing balance support: Bilateral upper extremity supported;During functional  activity Standing balance-Leahy Scale: Fair                             ADL either performed or assessed with clinical judgement   ADL Overall ADL's : Needs assistance/impaired     Grooming: Standing;Min guard   Upper Body Bathing: Set up;Sitting   Lower Body Bathing: Sit to/from stand;Min guard Lower Body Bathing Details (indicate cue type and reason): min guard in standing, figure 4 technique to wash (simulated) B feet Upper Body Dressing : Set up;Sitting   Lower Body Dressing: Minimal assistance;Sit to/from stand Lower Body Dressing Details (indicate cue type and reason): fatigued, min assist to don socks; able to don shoes and skirt with increased time; min guard in standing  Toilet Transfer: Min guard;Ambulation;RW Toilet Transfer Details (indicate cue type and reason): simulated to recliner  Toileting- Clothing Manipulation and Hygiene: Min guard;Sit to/from stand   Tub/ Shower Transfer: Min guard;Tub transfer;Ambulation;Shower Technical sales engineer Details (indicate cue type and reason): simulated in room, reviewed reverse step into tub using RW (discussed back precautions and reasoning to step over threshold before sitting down); reports she will have support at dc to complete Functional mobility during ADLs: Min guard;Rolling walker General ADL Comments: limited by pain, balance and precaution adherance     Vision       Perception     Praxis      Cognition Arousal/Alertness: Awake/alert Behavior During Therapy: WFL for tasks assessed/performed Overall Cognitive Status: Within Functional Limits for tasks assessed  Exercises     Shoulder Instructions       General Comments      Pertinent Vitals/ Pain       Pain Assessment: Faces Faces Pain Scale: Hurts little more Pain Location: back Pain Descriptors / Indicators: Aching;Discomfort;Guarding Pain Intervention(s):  Monitored during session;Repositioned  Home Living Family/patient expects to be discharged to:: Private residence Living Arrangements: Spouse/significant other Available Help at Discharge: Family;Available 24 hours/day Type of Home: House Home Access: Stairs to enter [employer] of Steps: 1   Home Layout: Two level Alternate Level Stairs-Number of Steps: 14 Alternate Level Stairs-Rails: Right Bathroom Shower/Tub: Teacher, early years/pre: Standard     Home Equipment: Cane - single point;Shower seat          Prior Functioning/Environment Level of Independence: Independent            Frequency  Min 2X/week        Progress Toward Goals  OT Goals(current goals can now be found in the care plan section)  Progress towards OT goals: Progressing toward goals  Acute Rehab OT Goals Patient Stated Goal: return home soon OT Goal Formulation: With patient Time For Goal Achievement: 10/11/18 Potential to Achieve Goals: Good ADL Goals Pt Will Perform Lower Body Bathing: with modified independence;sit to/from stand Pt Will Perform Lower Body Dressing: with modified independence;sit to/from stand Pt Will Transfer to Toilet: with modified independence;ambulating;regular height toilet Pt Will Perform Tub/Shower Transfer: Tub transfer;with modified independence;ambulating;shower seat;rolling walker  Plan Discharge plan remains appropriate;Frequency remains appropriate    Co-evaluation                 AM-PAC OT "6 Clicks" Daily Activity     Outcome Measure   Help from another person eating meals?: None Help from another person taking care of personal grooming?: None Help from another person toileting, which includes using toliet, bedpan, or urinal?: None Help from another person bathing (including washing, rinsing, drying)?: None Help from another person to put on and taking off regular upper body clothing?: None Help from another person to put on  and taking off regular lower body clothing?: A Little 6 Click Score: 23    End of Session Equipment Utilized During Treatment: Rolling walker;Back brace  OT Visit Diagnosis: Other abnormalities of gait and mobility (R26.89);Pain Pain - part of body: (back, incisional)   Activity Tolerance Patient tolerated treatment well   Patient Left in chair;with call bell/phone within reach   Nurse Communication Mobility status        Time: 1040-1106 OT Time Calculation (min): 26 min  Charges: OT General Charges $OT Visit: 1 Visit OT Evaluation $OT Eval Low Complexity: 1 Low OT Treatments $Self Care/Home Management : 23-37 mins  Delight Stare, OT Acute Rehabilitation Services Pager [phone] Office [phone]    Delight Stare 09/27/2018, 11:12 AM

## 2018-09-27 NOTE — Evaluation (Signed)
Occupational Therapy Evaluation Patient Details Name: Julia Deleon MRN: [id number] DOB: [birth year] Today's Date: 09/27/2018    History of Present Illness Patient is a 73 y/o female s/p PLIF L4-5 on 09/26/2018. PMH significant of COPD, hyperlipidemia, hypothyroidism.   Clinical Impression   PTA pt independent. Admitted for above and limited by problem list below, including impaired balance, back pain, decreased activity tolerance and precautions.  Educated on back precautions, brace mgmt and wear schedule, ADL compensatory techniques, DME, mobility, recommendations and safety.  Patient currently requires min guard for LB ADLs (using figure 4 technique seated- reports wearing compression stockings at home, which her spouse will assist with), UB ADLs with setup assist, toilet transfers with min guard to min assist (with plans to get toilet riser), and grooming with min guard standing at sink.  Good recall of back precautions, but requires min cueing to adhere to functionally. Patient will benefit from continued OT services while admitted to maximize independence and safety with ADLs but anticipate no further needs after dc.     Follow Up Recommendations  No OT follow up;Supervision - Intermittent    Equipment Recommendations  None recommended by OT(plan to purchase toilet riser)    Recommendations for Other Services       Precautions / Restrictions Precautions Precautions: Back Precaution Booklet Issued: Yes (comment) Precaution Comments: reviewed back precautions; handout given; able to recall with independence, but cueing to adhere to functionally Required Braces or Orthoses: Spinal Brace Spinal Brace: Lumbar corset;Applied in sitting position Restrictions Weight Bearing Restrictions: No      Mobility Bed Mobility Overal bed mobility: Needs Assistance Bed Mobility: Rolling;Sidelying to Sit Rolling: Min guard Sidelying to sit: Min guard       General bed mobility comments: OOB in  recliner upon entry  Transfers Overall transfer level: Needs assistance Equipment used: Rolling walker (2 wheeled) Transfers: Sit to/from Stand Sit to Stand: Min guard;Min assist         General transfer comment: for safety; cueing for hand placement; min assist from standard commode without UE support    Balance Overall balance assessment: Needs assistance Sitting-balance support: No upper extremity supported;Feet supported Sitting balance-Leahy Scale: Fair     Standing balance support: Bilateral upper extremity supported;During functional activity Standing balance-Leahy Scale: Fair                             ADL either performed or assessed with clinical judgement   ADL Overall ADL's : Needs assistance/impaired     Grooming: Standing;Min guard   Upper Body Bathing: Set up;Sitting   Lower Body Bathing: Sit to/from stand;Min guard   Upper Body Dressing : Supervision/safety;Set up;Sitting   Lower Body Dressing: Min guard;Sit to/from stand Lower Body Dressing Details (indicate cue type and reason): able to complete figure 4 technique with increased time, don/doffs socks with supervision; sit <>stand min guard Toilet Transfer: Minimal assistance;Ambulation;RW;Regular Toilet;Grab bars Toilet Transfer Details (indicate cue type and reason): pt requires min assist to ascend from standard commode without UE support, min guard using rail Toileting- Clothing Manipulation and Hygiene: Min guard;Sit to/from stand       Functional mobility during ADLs: Min guard;Rolling walker General ADL Comments: limited by pain, balance, back precautions; educated on compensatory techniques for ADLs; reports she will get spouse to assist with compression stockings at [place]  Pertinent Vitals/Pain Pain Assessment: Faces Faces Pain Scale: Hurts little more Pain Location: back Pain Descriptors / Indicators:  Aching;Discomfort;Guarding Pain Intervention(s): Monitored during session;Premedicated before session;Repositioned     Hand Dominance     Extremity/Trunk Assessment Upper Extremity Assessment Upper Extremity Assessment: Overall WFL for tasks assessed   Lower Extremity Assessment Lower Extremity Assessment: Defer to PT evaluation   Cervical / Trunk Assessment Cervical / Trunk Assessment: Normal   Communication Communication Communication: No difficulties   Cognition Arousal/Alertness: Awake/alert Behavior During Therapy: WFL for tasks assessed/performed Overall Cognitive Status: Within Functional Limits for tasks assessed                                     General Comments       Exercises     Shoulder Instructions      Home Living Family/patient expects to be discharged to:: Private residence Living Arrangements: Spouse/significant other Available Help at Discharge: Family;Available 24 hours/day Type of Home: House Home Access: Stairs to enter [employer] of Steps: 1   Home Layout: Two level Alternate Level Stairs-Number of Steps: 14 Alternate Level Stairs-Rails: Right Bathroom Shower/Tub: Teacher, early years/pre: Standard     Home Equipment: Cane - single point;Shower seat          Prior Functioning/Environment Level of Independence: Independent                 OT Problem List: Decreased strength;Decreased activity tolerance;Impaired balance (sitting and/or standing);Decreased safety awareness;Decreased knowledge of use of DME or AE;Decreased knowledge of precautions;Pain      OT Treatment/Interventions: Self-care/ADL training;Energy conservation;DME and/or AE instruction;Therapeutic activities;Balance training;Patient/family education    OT Goals(Current goals can be found in the care plan section) Acute Rehab OT Goals Patient Stated Goal: return home soon OT Goal Formulation: With patient Time For Goal  Achievement: 10/11/18 Potential to Achieve Goals: Good  OT Frequency: Min 2X/week   Barriers to D/C:            Co-evaluation              AM-PAC OT "6 Clicks" Daily Activity     Outcome Measure Help from another person eating meals?: None Help from another person taking care of personal grooming?: None Help from another person toileting, which includes using toliet, bedpan, or urinal?: None Help from another person bathing (including washing, rinsing, drying)?: None Help from another person to put on and taking off regular upper body clothing?: None Help from another person to put on and taking off regular lower body clothing?: A Little 6 Click Score: 23   End of Session Equipment Utilized During Treatment: Rolling walker;Back brace Nurse Communication: Mobility status  Activity Tolerance: Patient tolerated treatment well Patient left: in chair;with call bell/phone within reach  OT Visit Diagnosis: Other abnormalities of gait and mobility (R26.89);Pain Pain - part of body: (back, incisional)                Time: 8657-8469 OT Time Calculation (min): 22 min Charges:  OT General Charges $OT Visit: 1 Visit OT Evaluation $OT Eval Low Complexity: [place], OT Acute Rehabilitation Services Pager [phone] Office [phone]    Delight Stare 09/27/2018, 10:35 AM

## 2018-09-28 MED FILL — Heparin Sodium (Porcine) Inj 1000 Unit/ML: INTRAMUSCULAR | Qty: 30 | Status: AC

## 2018-09-28 MED FILL — Sodium Chloride IV Soln 0.9%: INTRAVENOUS | Qty: 1000 | Status: AC

## 2018-09-30 ENCOUNTER — Encounter (HOSPITAL_COMMUNITY): Payer: Self-pay | Admitting: Emergency Medicine

## 2018-09-30 ENCOUNTER — Inpatient Hospital Stay (HOSPITAL_COMMUNITY)
Admission: EM | Admit: 2018-09-30 | Discharge: 2018-10-03 | DRG: 700 | Disposition: A | Discharge status: Home / Self Care | Payer: Medicare Other | Attending: Neurosurgery | Admitting: Neurosurgery

## 2018-09-30 ENCOUNTER — Other Ambulatory Visit: Payer: Self-pay

## 2018-09-30 DIAGNOSIS — E039 Hypothyroidism, unspecified: Secondary | ICD-10-CM | POA: Diagnosis not present

## 2018-09-30 DIAGNOSIS — J449 Chronic obstructive pulmonary disease, unspecified: Secondary | ICD-10-CM | POA: Diagnosis not present

## 2018-09-30 DIAGNOSIS — E785 Hyperlipidemia, unspecified: Secondary | ICD-10-CM | POA: Diagnosis present

## 2018-09-30 DIAGNOSIS — G8918 Other acute postprocedural pain: Secondary | ICD-10-CM | POA: Diagnosis not present

## 2018-09-30 DIAGNOSIS — N9989 Other postprocedural complications and disorders of genitourinary system: Principal | ICD-10-CM | POA: Diagnosis present

## 2018-09-30 DIAGNOSIS — Z87440 Personal history of urinary (tract) infections: Secondary | ICD-10-CM

## 2018-09-30 DIAGNOSIS — M545 Low back pain, unspecified: Secondary | ICD-10-CM

## 2018-09-30 DIAGNOSIS — Z79890 Hormone replacement therapy (postmenopausal): Secondary | ICD-10-CM

## 2018-09-30 DIAGNOSIS — R339 Retention of urine, unspecified: Secondary | ICD-10-CM | POA: Diagnosis not present

## 2018-09-30 DIAGNOSIS — Z1159 Encounter for screening for other viral diseases: Secondary | ICD-10-CM

## 2018-09-30 DIAGNOSIS — R338 Other retention of urine: Secondary | ICD-10-CM | POA: Diagnosis not present

## 2018-09-30 DIAGNOSIS — I2510 Atherosclerotic heart disease of native coronary artery without angina pectoris: Secondary | ICD-10-CM | POA: Diagnosis present

## 2018-09-30 DIAGNOSIS — Z79899 Other long term (current) drug therapy: Secondary | ICD-10-CM

## 2018-09-30 DIAGNOSIS — Z03818 Encounter for observation for suspected exposure to other biological agents ruled out: Secondary | ICD-10-CM | POA: Diagnosis not present

## 2018-09-30 LAB — SARS CORONAVIRUS 2 BY RT PCR (HOSPITAL ORDER, PERFORMED IN ~~LOC~~ HOSPITAL LAB): SARS Coronavirus 2: NEGATIVE

## 2018-09-30 LAB — URINALYSIS, ROUTINE W REFLEX MICROSCOPIC
Bilirubin Urine: NEGATIVE
Glucose, UA: 50 mg/dL — AB
Hgb urine dipstick: NEGATIVE
Ketones, ur: NEGATIVE mg/dL
Leukocytes,Ua: NEGATIVE
Nitrite: NEGATIVE
Protein, ur: NEGATIVE mg/dL
Specific Gravity, Urine: 1.02 (ref 1.005–1.030)
pH: 5 (ref 5.0–8.0)

## 2018-09-30 LAB — CBC WITH DIFFERENTIAL/PLATELET
Abs Immature Granulocytes: 0.12 10*3/uL — ABNORMAL HIGH (ref 0.00–0.07)
Basophils Absolute: 0 10*3/uL (ref 0.0–0.1)
Basophils Relative: 0 %
Eosinophils Absolute: 0.2 10*3/uL (ref 0.0–0.5)
Eosinophils Relative: 2 %
HCT: 34.4 % — ABNORMAL LOW (ref 36.0–46.0)
Hemoglobin: 11.1 g/dL — ABNORMAL LOW (ref 12.0–15.0)
Immature Granulocytes: 2 %
Lymphocytes Relative: 18 %
Lymphs Abs: 1.3 10*3/uL (ref 0.7–4.0)
MCH: 30.8 pg (ref 26.0–34.0)
MCHC: 32.3 g/dL (ref 30.0–36.0)
MCV: 95.6 fL (ref 80.0–100.0)
Monocytes Absolute: 0.6 10*3/uL (ref 0.1–1.0)
Monocytes Relative: 8 %
Neutro Abs: 5.1 10*3/uL (ref 1.7–7.7)
Neutrophils Relative %: 70 %
Platelets: 187 10*3/uL (ref 150–400)
RBC: 3.6 MIL/uL — ABNORMAL LOW (ref 3.87–5.11)
RDW: 13.1 % (ref 11.5–15.5)
WBC: 7.2 10*3/uL (ref 4.0–10.5)
nRBC: 0 % (ref 0.0–0.2)

## 2018-09-30 LAB — BASIC METABOLIC PANEL
Anion gap: 9 (ref 5–15)
BUN: 17 mg/dL (ref 8–23)
CO2: 25 mmol/L (ref 22–32)
Calcium: 8.9 mg/dL (ref 8.9–10.3)
Chloride: 101 mmol/L (ref 98–111)
Creatinine, Ser: 0.93 mg/dL (ref 0.44–1.00)
GFR calc Af Amer: >60 mL/min (ref >60)
GFR calc non Af Amer: >60 mL/min (ref >60)
Glucose, Bld: 150 mg/dL — ABNORMAL HIGH (ref 70–99)
Potassium: 3.6 mmol/L (ref 3.5–5.1)
Sodium: 135 mmol/L (ref 135–145)

## 2018-09-30 MED ORDER — RISAQUAD PO CAPS
1.0000 | ORAL_CAPSULE | Freq: Every day | ORAL | Status: DC
  Administered 2018-09-30 – 2018-10-02 (×3): 1 via ORAL
  Filled 2018-09-30 (×6): qty 1

## 2018-09-30 MED ORDER — SODIUM CHLORIDE 0.9 % IV SOLN: 250.0000 mL | INTRAVENOUS | Status: DC

## 2018-09-30 MED ORDER — ADULT MULTIVITAMIN W/MINERALS CH
1.0000 | ORAL_TABLET | Freq: Every evening | ORAL | Status: DC
  Administered 2018-09-30 – 2018-10-02 (×3): 1 via ORAL
  Filled 2018-09-30 (×3): qty 1

## 2018-09-30 MED ORDER — POLYETHYLENE GLYCOL 3350 17 G PO PACK
17.0000 g | PACK | Freq: Every day | ORAL | Status: DC | PRN
  Administered 2018-10-02: 22:00:00 17 g via ORAL
  Filled 2018-09-30: qty 1

## 2018-09-30 MED ORDER — ACETAMINOPHEN 325 MG PO TABS: 650.0000 mg | ORAL_TABLET | ORAL | Status: DC | PRN

## 2018-09-30 MED ORDER — SODIUM CHLORIDE 0.9% FLUSH: 3.0000 mL | Freq: Two times a day (BID) | INTRAVENOUS | Status: DC

## 2018-09-30 MED ORDER — ONDANSETRON HCL 4 MG/2ML IJ SOLN: 4.0000 mg | Freq: Four times a day (QID) | INTRAMUSCULAR | Status: DC | PRN

## 2018-09-30 MED ORDER — HYDROMORPHONE HCL 1 MG/ML IJ SOLN
0.5000 mg | Freq: Once | INTRAMUSCULAR | Status: AC
  Administered 2018-09-30: 10:00:00 0.5 mg via INTRAVENOUS
  Filled 2018-09-30: qty 1

## 2018-09-30 MED ORDER — DIAZEPAM 5 MG PO TABS
5.0000 mg | ORAL_TABLET | Freq: Four times a day (QID) | ORAL | Status: DC | PRN
  Administered 2018-09-30 – 2018-10-02 (×6): 10 mg via ORAL
  Filled 2018-09-30 (×6): qty 2

## 2018-09-30 MED ORDER — ACETAMINOPHEN 650 MG RE SUPP: 650.0000 mg | RECTAL | Status: DC | PRN

## 2018-09-30 MED ORDER — SODIUM CHLORIDE 0.9% FLUSH: 3.0000 mL | INTRAVENOUS | Status: DC | PRN

## 2018-09-30 MED ORDER — HYDROMORPHONE HCL 1 MG/ML IJ SOLN
0.5000 mg | INTRAMUSCULAR | Status: DC | PRN
  Administered 2018-09-30 – 2018-10-01 (×3): 0.5 mg via INTRAVENOUS
  Filled 2018-09-30 (×4): qty 1

## 2018-09-30 MED ORDER — VITAMIN D 25 MCG (1000 UNIT) PO TABS
2000.0000 [IU] | ORAL_TABLET | Freq: Every day | ORAL | Status: DC
  Administered 2018-09-30 – 2018-10-03 (×4): 2000 [IU] via ORAL
  Filled 2018-09-30 (×4): qty 2

## 2018-09-30 MED ORDER — VITAMIN B-12 1000 MCG PO TABS
500.0000 ug | ORAL_TABLET | Freq: Two times a day (BID) | ORAL | Status: DC
  Administered 2018-09-30 – 2018-10-03 (×6): 500 ug via ORAL
  Filled 2018-09-30 (×8): qty 1

## 2018-09-30 MED ORDER — LOSARTAN POTASSIUM 50 MG PO TABS
100.0000 mg | ORAL_TABLET | Freq: Every day | ORAL | Status: DC
  Administered 2018-09-30 – 2018-10-03 (×4): 100 mg via ORAL
  Filled 2018-09-30 (×4): qty 2

## 2018-09-30 MED ORDER — ONDANSETRON HCL 4 MG PO TABS: 4.0000 mg | ORAL_TABLET | Freq: Four times a day (QID) | ORAL | Status: DC | PRN

## 2018-09-30 MED ORDER — AZELASTINE HCL 0.1 % NA SOLN
1.0000 | Freq: Every day | NASAL | Status: DC
  Administered 2018-10-01 – 2018-10-03 (×3): 1 via NASAL
  Filled 2018-09-30: qty 30

## 2018-09-30 MED ORDER — OXYCODONE HCL 5 MG PO TABS
5.0000 mg | ORAL_TABLET | ORAL | Status: DC | PRN
  Administered 2018-09-30: 5 mg via ORAL
  Administered 2018-09-30 – 2018-10-03 (×11): 10 mg via ORAL
  Filled 2018-09-30 (×4): qty 2
  Filled 2018-09-30: qty 1
  Filled 2018-09-30 (×7): qty 2

## 2018-09-30 MED ORDER — FLEET ENEMA 7-19 GM/118ML RE ENEM: 1.0000 | ENEMA | Freq: Once | RECTAL | Status: DC | PRN

## 2018-09-30 MED ORDER — BISACODYL 10 MG RE SUPP: 10.0000 mg | Freq: Every day | RECTAL | Status: DC | PRN

## 2018-09-30 MED ORDER — LORATADINE 10 MG PO TABS
10.0000 mg | ORAL_TABLET | Freq: Every day | ORAL | Status: DC
  Administered 2018-09-30 – 2018-10-01 (×2): 10 mg via ORAL
  Filled 2018-09-30 (×4): qty 1

## 2018-09-30 MED ORDER — LEVOTHYROXINE SODIUM 75 MCG PO TABS
75.0000 ug | ORAL_TABLET | Freq: Every day | ORAL | Status: DC
  Administered 2018-10-01 – 2018-10-03 (×3): 75 ug via ORAL
  Filled 2018-09-30 (×3): qty 1

## 2018-09-30 MED ORDER — LEVOMEFOLATE GLUCOSAMINE 400 MCG PO CAPS: 400.0000 ug | ORAL_CAPSULE | Freq: Every day | ORAL | Status: DC

## 2018-09-30 NOTE — ED Triage Notes (Addendum)
Pt in with c/o low back pain - had recent L4, L5 and S1 fusion on 5/19. Arrives with Aspen low back brace, denies any numbness/tingling in LE's but having some trouble emptying bladder post-surgery. Surgery done by Dr. Arnoldo Morale

## 2018-09-30 NOTE — H&P (Addendum)
Julia Deleon is an 73 y.o. female.   Chief Complaint: Urinary Retention HPI: The patient is a 73 year old white female with a history of back and bilateral buttock and leg pain consistent with neurogenic claudication.  She failed medical management. She was worked up with lumbar x-rays and lumbar MRI which demonstrated an L4-5 spondylolisthesis and spinal stenosis. She underwent an L4-5 decompression, instrumentation, and fusion on 09/26/2018 by Dr. Arnoldo Morale. Her post op course was unremarkable and she was discharged home. Her back pain became increasingly worse and on 09/29/2018, she noticed she was unable to fully empty her bladder. She presented to the ED the morning of 09/30/2018, where over a liter of urine was drained from her bladder once an indwelling Foley catheter was placed. She reports a history of urinary tract infections and bladder dysfunction following surgery. She reports her back pain is still present, but much improved since the indwelling catheter was placed.  Past Medical History:  Diagnosis Date  . Arthritis   . COPD (chronic obstructive pulmonary disease) ([place])   . Family history of adverse reaction to anesthesia    father was slow to wake up  . Fuch's endothelial dystrophy   . Hyperlipidemia   . Hypothyroidism   . PONV (postoperative nausea and vomiting)    slow to wake up and PONV    Past Surgical History:  Procedure Laterality Date  . APPENDECTOMY    . BACK SURGERY  09/26/2018   L4-5 PLIF by Dr. Arnoldo Morale  . CARDIAC CATHETERIZATION    . CHOLECYSTECTOMY    . EYE SURGERY    . FOOT SURGERY Right    pin removed left  . laser vein surgery    . NASAL SINUS SURGERY    . RIGHT/LEFT HEART CATH AND CORONARY ANGIOGRAPHY N/A 12/06/2017   Procedure: RIGHT/LEFT HEART CATH AND CORONARY ANGIOGRAPHY;  Surgeon: Yolonda Kida, MD;  Location: [facility];  Service: Cardiovascular;  Laterality: N/A;  . TONSILLECTOMY      Family History  Problem Relation Age of Onset  .  Cancer Mother        lung  . CAD Mother   . Stroke Father   . Cancer Sister        breast  . Breast cancer Sister 36  . Varicose Veins Son   . Heart disease Maternal Grandmother   . Cancer Paternal Grandmother        throat  . Emphysema Paternal Grandfather   . Fibromyalgia Sister   . Breast cancer Paternal Aunt    Social History:  reports that she has never smoked. She has never used smokeless tobacco. She reports that she does not drink alcohol or use drugs.  Allergies:  Allergies  Allergen Reactions  . Levofloxacin Hives  . Methylisothiazolinone Other (See Comments)    Positive on allergy test  . Thimerosal Other (See Comments)    Positive on allergy test  . Amoxicillin-Pot Clavulanate Nausea Only    Did it involve swelling of the face/tongue/throat, SOB, or low BP? No Did it involve sudden or severe rash/hives, skin peeling, or any reaction on the inside of your mouth or nose? No Did you need to seek medical attention at a hospital or doctor's office? Unknown When did it last happen?unknown If all above answers are "NO", may proceed with cephalosporin use.   . Colesevelam Hcl Nausea Only    WELCHOL  . Latex Rash    Family history of latex allergy  . Simvastatin Itching    (  Not in a hospital admission)   Results for orders placed or performed during the hospital encounter of 09/30/18 (from the past 48 hour(s))  Urinalysis, Routine w reflex microscopic     Status: Abnormal   Collection Time: 09/30/18  9:27 AM  Result Value Ref Range   Color, Urine YELLOW YELLOW   APPearance HAZY (A) CLEAR   Specific Gravity, Urine 1.020 1.005 - 1.030   pH 5.0 5.0 - 8.0   Glucose, UA 50 (A) NEGATIVE mg/dL   Hgb urine dipstick NEGATIVE NEGATIVE   Bilirubin Urine NEGATIVE NEGATIVE   Ketones, ur NEGATIVE NEGATIVE mg/dL   Protein, ur NEGATIVE NEGATIVE mg/dL   Nitrite NEGATIVE NEGATIVE   Leukocytes,Ua NEGATIVE NEGATIVE    Comment: Performed at [facility] [address] [place], [place] [address]  CBC with Differential     Status: Abnormal   Collection Time: 09/30/18  9:36 AM  Result Value Ref Range   WBC 7.2 4.0 - 10.5 K/uL   RBC 3.60 (L) 3.87 - 5.11 MIL/uL   Hemoglobin 11.1 (L) 12.0 - 15.0 g/dL   HCT 34.4 (L) 36.0 - 46.0 %   MCV 95.6 80.0 - 100.0 fL   MCH 30.8 26.0 - 34.0 pg   MCHC 32.3 30.0 - 36.0 g/dL   RDW 13.1 11.5 - 15.5 %   Platelets 187 150 - 400 K/uL   nRBC 0.0 0.0 - 0.2 %   Neutrophils Relative % 70 %   Neutro Abs 5.1 1.7 - 7.7 K/uL   Lymphocytes Relative 18 %   Lymphs Abs 1.3 0.7 - 4.0 K/uL   Monocytes Relative 8 %   Monocytes Absolute 0.6 0.1 - 1.0 K/uL   Eosinophils Relative 2 %   Eosinophils Absolute 0.2 0.0 - 0.5 K/uL   Basophils Relative 0 %   Basophils Absolute 0.0 0.0 - 0.1 K/uL   Immature Granulocytes 2 %   Abs Immature Granulocytes 0.12 (H) 0.00 - 0.07 K/uL    Comment: Performed at [facility] [address] [place], [place] [address]  Basic metabolic panel     Status: Abnormal   Collection Time: 09/30/18  9:36 AM  Result Value Ref Range   Sodium 135 135 - 145 mmol/L   Potassium 3.6 3.5 - 5.1 mmol/L   Chloride 101 98 - 111 mmol/L   CO2 25 22 - 32 mmol/L   Glucose, Bld 150 (H) 70 - 99 mg/dL   BUN 17 8 - 23 mg/dL   Creatinine, Ser 0.93 0.44 - 1.00 mg/dL   Calcium 8.9 8.9 - 10.3 mg/dL   GFR calc non Af Amer >60 >60 mL/min   GFR calc Af Amer >60 >60 mL/min   Anion gap 9 5 - 15    Comment: Performed at [facility] [address] [place], [place] [address]   No results found.  Review of Systems  Constitutional: Negative for chills, diaphoresis, fever, malaise/fatigue and weight loss.  HENT: Negative.   Eyes: Negative.   Respiratory: Negative.   Cardiovascular: Negative.   Gastrointestinal: Negative.   Genitourinary: Positive for flank pain and frequency.  Musculoskeletal: Positive for back pain. Negative for falls, joint pain, myalgias and neck pain.  Skin: Negative for itching and rash.   Neurological: Negative.   Endo/Heme/Allergies: Negative.   Psychiatric/Behavioral: Negative.     Blood pressure (!) 163/61, pulse 81, temperature 98 F (36.7 C), resp. rate 16, weight 72.3 kg, SpO2 100 %. Physical Exam  Constitutional:  She is oriented to person, place, and time. She appears well-developed and well-nourished.  HENT:  Head: Normocephalic and atraumatic.  Eyes: Pupils are equal, round, and reactive to light. Conjunctivae are normal.  Neck: Normal range of motion. Neck supple.  Cardiovascular: Normal rate and regular rhythm.  Respiratory: Effort normal and breath sounds normal.  GI: Soft. Bowel sounds are normal.  Musculoskeletal: Normal range of motion.        General: Tenderness present.     Comments: Low back pain  Neurological: She is alert and oriented to person, place, and time.  Skin: Skin is warm and dry.     Psychiatric: She has a normal mood and affect. Her behavior is normal. Thought content normal.     Assessment/Plan Patient is four days s/p L4-5 decompression, instrumentation, and fusion by Dr. Arnoldo Morale. She developed increasing low back pain that was not responsive to pain medication and muscle relaxers. She noted she was unable to fully empty her bladder and was advised to go to the ED. ED placed indwelling catheter and over a liter of urine was returned. Patient feels her pain is improving since the catheter was placed.   -She is agreeable to being admitted for observation for urinary retention and pain management. -UA negative -We will trial the removal of the foley catheter tomorrow morning.  Patricia Nettle, NP 09/30/2018, 10:56 AM

## 2018-09-30 NOTE — ED Notes (Signed)
Pts post void bladder scan volume is >968ml

## 2018-09-30 NOTE — ED Provider Notes (Addendum)
[facility] EMERGENCY DEPARTMENT Provider Note   CSN: [id number] Arrival date & time: 09/30/18  0109    History   Chief Complaint Chief Complaint  Patient presents with  . Back Pain  . Recent Surgery    HPI Julia Deleon is a 73 y.o. female presenting for evaluation of back pain and difficulty urinating.  Patient states she had surgery 4 days ago with Dr. Arnoldo Morale with [facility].  She had an L4-L5 fusion.  Immediately after the surgery, she was able to urinate without difficulty.  She was feeling well upon discharge the following day.  Since then, she has had increasing difficulty urinating.  She states she feels like me she needs to urinate, but is unable to do so.  She is able to squeeze a little bit out, denying dysuria or blood in her urine.  Additionally, patient has had increasing issues with back pain.  She is called her neurosurgeon multiple times, they changed her muscle relaxer and increased her pain medication without improvement of symptoms.  Additionally, patient has not had a bowel movement in 4 days.  Patient denies fevers, chills, sore throat, cough, chest pain, shortness breath, nausea, vomiting, anterior abdominal pain.  Patient states she is history of frequent UTIs, especially following Foley catheter due to a short bladder scan.  Additionally, patient states that since giving birth, she has had difficulty completely emptying her bladder. Back pain is constant, worse with palpation and movement.  She denies numbness or tingling.  She denies loss of bowel bladder control.  Additional history obtained per chart review, patient with a history of arthritis, COPD, hyperlipidemia, hypothyroidism, cad.     HPI  Past Medical History:  Diagnosis Date  . Arthritis   . COPD (chronic obstructive pulmonary disease) ([place])   . Family history of adverse reaction to anesthesia    father was slow to wake up  . Fuch's endothelial dystrophy   .  Hyperlipidemia   . Hypothyroidism   . PONV (postoperative nausea and vomiting)    slow to wake up and PONV    Patient Active Problem List   Diagnosis Date Noted  . Spondylolisthesis, lumbar region 09/26/2018  . Aortic atherosclerosis ([place]) 08/31/2018  . CAD (coronary artery disease) 02/23/2018  . Fatigue 01/20/2018  . Short of breath on exertion 10/17/2017  . Advanced care planning/counseling discussion 03/28/2017  . Varicose veins of both lower extremities with complications 32/35/5732  . Arthritis 12/09/2016  . Vaginal atrophy 09/10/2015  . Perimenopausal vasomotor symptoms 09/10/2015  . Menopause 09/10/2015  . Onychomycosis due to dermatophyte 04/07/2015  . Bursitis of right shoulder 02/26/2015  . Environmental and seasonal allergies 02/26/2015  . Other allergic rhinitis 02/26/2015  . Dysuria 12/25/2014  . Sciatica 11/27/2014  . Osteoporosis 11/26/2014  . COPD (chronic obstructive pulmonary disease) ([place]) 11/26/2014  . Fuchs' corneal dystrophy 11/26/2014  . Essential (primary) hypertension 11/26/2014  . Hyperlipidemia 11/26/2014  . Chronic kidney disease, stage III (moderate) ([place]) 11/26/2014  . Hypothyroidism 11/26/2014  . Low back pain 11/26/2014  . Endothelial corneal dystrophy 11/26/2014    Past Surgical History:  Procedure Laterality Date  . APPENDECTOMY    . BACK SURGERY  09/26/2018   L4-5 PLIF by Dr. Arnoldo Morale  . CARDIAC CATHETERIZATION    . CHOLECYSTECTOMY    . EYE SURGERY    . FOOT SURGERY Right    pin removed left  . laser vein surgery    . NASAL SINUS SURGERY    .  RIGHT/LEFT HEART CATH AND CORONARY ANGIOGRAPHY N/A 12/06/2017   Procedure: RIGHT/LEFT HEART CATH AND CORONARY ANGIOGRAPHY;  Surgeon: Yolonda Kida, MD;  Location: [facility];  Service: Cardiovascular;  Laterality: N/A;  . TONSILLECTOMY       OB History   No obstetric history on file.      Home Medications    Prior to Admission medications   Medication Sig Start Date End  Date Taking? Authorizing Provider  acetaminophen (TYLENOL) 650 MG CR tablet Take 650-1,300 mg by mouth every 8 (eight) hours as needed for pain.    Yes [provider]  Alpha-Lipoic Acid 100 MG TABS Take 100 mg by mouth daily with lunch.   Yes [provider]  azelastine (ASTELIN) 0.1 % nasal spray Place 1 spray into both nostrils daily. Use in each nostril as directed   Yes [provider]  Cholecalciferol (VITAMIN D3) 2000 units TABS Take 2,000 Units by mouth daily.    Yes [provider]  Coenzyme Q10 (COQ-10) 100 MG CAPS Take 100 mg by mouth daily.   Yes [provider]  CRANBERRY PO Take 1 capsule by mouth daily.   Yes [provider]  Cyanocobalamin (B-12-SL) 1000 MCG SUBL Place 500 mcg under the tongue 2 (two) times a day.   Yes [provider]  DHEA 10 MG CAPS Take 10 mg by mouth daily.   Yes [provider]  diazepam (VALIUM) 5 MG tablet Take 5 mg by mouth every 6 (six) hours as needed. 09/29/18  Yes [provider]  docusate sodium (COLACE) 100 MG capsule Take 1 capsule (100 mg total) by mouth 2 (two) times daily. 09/27/18  Yes Newman Pies, MD  estradiol (ESTRACE VAGINAL) 0.1 MG/GM vaginal cream 0.5 twice weekly --COMPOUND PLS Patient taking differently: Place 1 Applicatorful vaginally 3 (three) times a week. 0.5 twice weekly --COMPOUND PLS 09/21/17  Yes Defrancesco, Alanda Slim, MD  fexofenadine (ALLEGRA) 60 MG tablet Take 60 mg by mouth daily.   Yes [provider]  Glucosamine-Chondroitin (GLUCOSAMINE CHONDR COMPLEX PO) Take 1 tablet by mouth 2 (two) times a day.   Yes [provider]  Levomefolate Glucosamine (METHYLFOLATE) 400 MCG CAPS Take 400 mcg by mouth daily with lunch.   Yes [provider]  levothyroxine (SYNTHROID) 75 MCG tablet Take 75 mcg by mouth daily before breakfast.   Yes [provider]  losartan (COZAAR) 100 MG tablet Take 1 tablet (100 mg total) by  mouth daily. 08/31/18  Yes Crissman, Jeannette How, MD  Multiple Vitamin (MULTIVITAMIN WITH MINERALS) TABS tablet Take 1 tablet by mouth every evening.   Yes [provider]  Multiple Vitamins-Minerals (LUTEIN-ZEAXANTHIN PO) Take 1 tablet by mouth at bedtime.   Yes [provider]  oxyCODONE (OXY IR/ROXICODONE) 5 MG immediate release tablet Take 1 tablet (5 mg total) by mouth every 4 (four) hours as needed for moderate pain ((score 4 to 6)). 09/27/18  Yes Newman Pies, MD  Probiotic CAPS Take 1 capsule by mouth at bedtime.   Yes [provider]  Progesterone Micronized (PROGESTERONE, BULK,) POWD 4% compound- topically Patient taking differently: Apply 1 application topically See admin instructions. 4% compound- topically, apply topically daily except Sundays 09/21/17  Yes Defrancesco, Alanda Slim, MD  triamcinolone (NASACORT ALLERGY 24HR) 55 MCG/ACT AERO nasal inhaler Place 2 sprays daily into the nose. Patient taking differently: Place 1 spray into the nose daily as needed.  03/28/17  Yes Guadalupe Maple, MD  TURMERIC PO  Take 1 tablet by mouth 2 (two) times a week.   Yes [provider]  chlorzoxazone (PARAFON) 500 MG tablet Take 1 tablet (500 mg total) by mouth 4 (four) times daily as needed for muscle spasms. Patient not taking: Reported on 09/30/2018 09/07/18   Guadalupe Maple, MD    Family History Family History  Problem Relation Age of Onset  . Cancer Mother        lung  . CAD Mother   . Stroke Father   . Cancer Sister        breast  . Breast cancer Sister 78  . Varicose Veins Son   . Heart disease Maternal Grandmother   . Cancer Paternal Grandmother        throat  . Emphysema Paternal Grandfather   . Fibromyalgia Sister   . Breast cancer Paternal Aunt     Social History Social History   Tobacco Use  . Smoking status: Never Smoker  . Smokeless tobacco: Never Used  Substance Use Topics  . Alcohol use: No    Alcohol/week: 0.0 standard drinks  .  Drug use: No     Allergies   Levofloxacin; Methylisothiazolinone; Thimerosal; Amoxicillin-pot clavulanate; Colesevelam hcl; Latex; and Simvastatin   Review of Systems Review of Systems  Genitourinary: Positive for difficulty urinating.  Musculoskeletal: Positive for back pain.  All other systems reviewed and are negative.    Physical Exam Updated Vital Signs BP (!) 163/61 (BP Location: Right Arm)   Pulse 81   Temp 98 F (36.7 C)   Resp 16   Wt 72.3 kg   LMP  (LMP Unknown)   SpO2 100%   BMI 27.36 kg/m   Physical Exam Vitals signs and nursing note reviewed. Exam conducted with a chaperone present.  Constitutional:      General: She is not in acute distress.    Appearance: She is well-developed.     Comments: Elderly female who appears uncomfortable due to pain, but nontoxic in appearance.  HENT:     Head: Normocephalic and atraumatic.  Eyes:     Conjunctiva/sclera: Conjunctivae normal.     Pupils: Pupils are equal, round, and reactive to light.  Neck:     Musculoskeletal: Normal range of motion and neck supple.  Cardiovascular:     Rate and Rhythm: Normal rate and regular rhythm.     Pulses: Normal pulses.  Pulmonary:     Effort: Pulmonary effort is normal. No respiratory distress.     Breath sounds: Normal breath sounds. No wheezing.  Abdominal:     General: There is no distension.     Palpations: Abdomen is soft. There is no mass.     Tenderness: There is abdominal tenderness. There is no guarding or rebound.     Comments: Mild discomfort with tenderness palpation of the suprapubic abdomen/bladder.  No rigidity, guarding, distention.  Abdomen soft.  Genitourinary:    Rectum: External hemorrhoid present. Normal anal tone.     Comments: Normal anal tone. Musculoskeletal: Normal range of motion.     Comments: Patient able to stand for transfer.  Strength intact of lower extremities bilaterally.  Sensation intact bilaterally.  Good pedal pulses.  No saddle  paresthesias. Incision site noted over midline spine with minimal blood seen through the dressing. No surrounding erythema or warmth. No ttp of the low back musculature.   Skin:    General: Skin is warm and dry.     Capillary Refill: Capillary refill takes less than 2  seconds.  Neurological:     Mental Status: She is alert and oriented to person, place, and time.      ED Treatments / Results  Labs (all labs ordered are listed, but only abnormal results are displayed) Labs Reviewed  CBC WITH DIFFERENTIAL/PLATELET - Abnormal; Notable for the following components:      Result Value   RBC 3.60 (*)    Hemoglobin 11.1 (*)    HCT 34.4 (*)    Abs Immature Granulocytes 0.12 (*)    All other components within normal limits  BASIC METABOLIC PANEL - Abnormal; Notable for the following components:   Glucose, Bld 150 (*)    All other components within normal limits  URINALYSIS, ROUTINE W REFLEX MICROSCOPIC - Abnormal; Notable for the following components:   APPearance HAZY (*)    Glucose, UA 50 (*)    All other components within normal limits  URINE CULTURE  SARS CORONAVIRUS 2 (HOSPITAL ORDER, [facility])    EKG None  Radiology No results found.  Procedures Procedures (including critical care time)  Medications Ordered in ED Medications  HYDROmorphone (DILAUDID) injection 0.5 mg (0.5 mg Intravenous Given 09/30/18 0936)     Initial Impression / Assessment and Plan / ED Course  I have reviewed the triage vital signs and the nursing notes.  Pertinent labs & imaging results that were available during my care of the patient were reviewed by me and considered in my medical decision making (see chart for details).        Patient presenting for evaluation of urinary retention and back pain.  Physical exam shows patient appears uncomfortable, but nontoxic in appearance.  She is afebrile not tachycardic.  Examine the incision shows normal blood in the  dressing, but no significant signs of infection.  Tenderness palpation.  Contusion/blood pooling in the chest over the incision site, but sensation intact.  Normal rectal tone.  No saddle paresthesias.  Doubt spinal cord compression, neuropathy, cauda equina syndrome.  Will obtain labs, urine urine, postvoid bladder scan, and treat pain. Case discussed with attending, Dr. Jeanell Sparrow agrees to plan.   Postvoid bladder scan shows greater than 999 mL.  Foley placed.  Will consult with neurosurgery.  Labs reassuring, no leukocytosis.  Creatinine stable.  UA without infection.  On reassessment, patient reports pain is improved with pain medication and Foley.  Discussed with Arnetha Massy from [place] neuro[facility], who will evaluate the patient.  Per Gayla Medicus, NP, patient to be admitted to neurosurgery for observation.  Covid test ordered  Final Clinical Impressions(s) / ED Diagnoses   Final diagnoses:  Urinary retention  Post-op pain  Acute midline low back pain without sciatica    ED Discharge Orders    None       Franchot Heidelberg, PA-C 09/30/18 [place], Kendahl Bumgardner, PA-C 09/30/18 1122    Pattricia Boss, MD 10/05/18 [phone]

## 2018-10-01 LAB — URINE CULTURE: Culture: <10000 — AB

## 2018-10-01 MED ORDER — TAMSULOSIN HCL 0.4 MG PO CAPS: 0.4000 mg | ORAL_CAPSULE | Freq: Every day | ORAL | 0 refills | Status: DC

## 2018-10-01 MED ORDER — TAMSULOSIN HCL 0.4 MG PO CAPS
0.4000 mg | ORAL_CAPSULE | Freq: Every day | ORAL | Status: DC
  Administered 2018-10-01 – 2018-10-03 (×3): 0.4 mg via ORAL
  Filled 2018-10-01 (×3): qty 1

## 2018-10-01 NOTE — Progress Notes (Signed)
   10/01/18 1626  Urine Characteristics  Bladder Scan Volume (mL) 575 mL    Patient still has not voided. Nurse has gotten patient up to bathroom with no success. Bladder scan showed 575cc. Upon assessment patient denied any pelvic pressure or discomfort. She requests to not have an in-an-out cath at the moment. She would like to wait and more time to try and urinate.

## 2018-10-01 NOTE — Care Management Obs Status (Signed)
[facility] NOTIFICATION   Patient Details  Name: Julia Deleon MRN: [id number] Date of Birth: 20-Aug-1945   Medicare Observation Status Notification Given:  Yes    Oretha Milch, LCSW 10/01/2018, 9:12 AM

## 2018-10-01 NOTE — Progress Notes (Addendum)
Neurosurgery Service Progress Note  Subjective: No acute events overnight, foley d/c'd this morning, has not voided yet   Objective: Vitals:   09/30/18 2300 10/01/18 0400 10/01/18 0756 10/01/18 0806  BP:  (!) 124/49 123/64 (!) 104/55  Pulse:  64  67  Resp:   18 20  Temp:  97.7 F (36.5 C) 98.2 F (36.8 C) 98.2 F (36.8 C)  TempSrc:  Oral Oral Oral  SpO2:  100% 98% 99%  Weight: 72.3 kg     Height: 5' 4.02" (1.626 m)      Temp (24hrs), Avg:98.1 F (36.7 C), Min:97.7 F (36.5 C), Max:98.5 F (36.9 C)  CBC Latest Ref Rng & Units 09/30/2018 09/27/2018 09/22/2018  WBC 4.0 - 10.5 K/uL 7.2 9.0 7.0  Hemoglobin 12.0 - 15.0 g/dL 11.1(L) 10.6(L) 13.0  Hematocrit 36.0 - 46.0 % 34.4(L) 32.3(L) 40.5  Platelets 150 - 400 K/uL 187 169 187   BMP Latest Ref Rng & Units 09/30/2018 09/27/2018 09/22/2018  Glucose 70 - 99 mg/dL 150(H) 123(H) 152(H)  BUN 8 - 23 mg/dL 17 27(H) 20  Creatinine 0.44 - 1.00 mg/dL 0.93 1.23(H) 1.28(H)  BUN/Creat Ratio 12 - 28 - - -  Sodium 135 - 145 mmol/L 135 138 138  Potassium 3.5 - 5.1 mmol/L 3.6 4.2 4.1  Chloride 98 - 111 mmol/L 101 106 105  CO2 22 - 32 mmol/L 25 23 25   Calcium 8.9 - 10.3 mg/dL 8.9 8.6(L) 9.0    Intake/Output Summary (Last 24 hours) at 10/01/2018 1610 Last data filed at 10/01/2018 9604 Gross per 24 hour  Intake -  Output 3300 ml  Net -3300 ml    Current Facility-Administered Medications:  .  acetaminophen (TYLENOL) tablet 650 mg, 650 mg, Oral, Q4H PRN **OR** acetaminophen (TYLENOL) suppository 650 mg, 650 mg, Rectal, Q4H PRN, Bergman, Meghan D, NP .  acidophilus (RISAQUAD) capsule 1 capsule, 1 capsule, Oral, QHS, Bergman, Meghan D, NP, 1 capsule at 09/30/18 2131 .  azelastine (ASTELIN) 0.1 % nasal spray 1 spray, 1 spray, Each Nare, Daily, Bergman, Meghan D, NP .  bisacodyl (DULCOLAX) suppository 10 mg, 10 mg, Rectal, Daily PRN, Reinaldo Meeker, Meghan D, NP .  cholecalciferol (VITAMIN D3) tablet 2,000 Units, 2,000 Units, Oral, Daily, Bergman, Meghan D,  NP, 2,000 Units at 09/30/18 1442 .  diazepam (VALIUM) tablet 5-10 mg, 5-10 mg, Oral, Q6H PRN, Bergman, Meghan D, NP, 10 mg at 10/01/18 0600 .  HYDROmorphone (DILAUDID) injection 0.5 mg, 0.5 mg, Intravenous, Q2H PRN, Bergman, Meghan D, NP, 0.5 mg at 09/30/18 1814 .  levothyroxine (SYNTHROID) tablet 75 mcg, 75 mcg, Oral, QAC breakfast, Bergman, Meghan D, NP, 75 mcg at 10/01/18 0559 .  loratadine (CLARITIN) tablet 10 mg, 10 mg, Oral, Daily, Bergman, Meghan D, NP, 10 mg at 09/30/18 1443 .  losartan (COZAAR) tablet 100 mg, 100 mg, Oral, Daily, Bergman, Meghan D, NP, 100 mg at 09/30/18 1443 .  multivitamin with minerals tablet 1 tablet, 1 tablet, Oral, QPM, Bergman, Meghan D, NP, 1 tablet at 09/30/18 1806 .  ondansetron (ZOFRAN) tablet 4 mg, 4 mg, Oral, Q6H PRN **OR** ondansetron (ZOFRAN) injection 4 mg, 4 mg, Intravenous, Q6H PRN, Bergman, Meghan D, NP .  oxyCODONE (Oxy IR/ROXICODONE) immediate release tablet 5-10 mg, 5-10 mg, Oral, Q4H PRN, Bergman, Meghan D, NP, 10 mg at 10/01/18 0600 .  polyethylene glycol (MIRALAX / GLYCOLAX) packet 17 g, 17 g, Oral, Daily PRN, Bergman, Meghan D, NP .  sodium phosphate (FLEET) 7-19 GM/118ML enema 1 enema, 1 enema, Rectal, Once  PRN, Viona Gilmore D, NP .  vitamin B-12 (CYANOCOBALAMIN) tablet 500 mcg, 500 mcg, Oral, BID, Bergman, Meghan D, NP, 500 mcg at 09/30/18 2131   Physical Exam: Awake/alert, FCx4, MAEx4 with symmetric strength  Assessment & Plan: 73 y.o. woman s/p L4-5 decompression and instrumented fusion, admitted for post-op urinary retention.  -foley d/c'd, will observe to see if she's able to urinate on her own -started tamsulosin -if able to void on her own and pain is well controlled, can be discharged this afternoon, otherwise will need to remain in house overnight and re-evaluate in the morning -will check PVR after voiding before discharge  Judith Part  10/01/18 9:26 AM

## 2018-10-01 NOTE — Discharge Summary (Addendum)
Discharge Summary  Date of Admission: 09/30/2018  Date of Discharge: 10/01/18  Attending Physician: Newman Pies, MD  Hospital Course: Patient presented with post-operative urinary retention after a prior lumbar decompression and fusion earlier this week. A foley was placed with over a liter of output. She was admitted and started on tamsulosin with improved pain controlled. A urinalysis showed no evidence of UTI. Her foley was discontinued and she was able to void spontaneously. Her hospital course was otherwise uncomplicated and the patient was discharged home on 10/01/2018. She will follow up in clinic with Dr. Arnoldo Morale in 3 weeks.  Neurologic exam at discharge:  MAEx4 symmetrically with intact sensation  Discharge diagnosis: Post-operative urinary retention  Judith Part, MD 10/01/18 9:32 AM

## 2018-10-01 NOTE — Progress Notes (Signed)
   10/01/18 1255  Urine Characteristics  Bladder Scan Volume (mL) 459 mL    It had been over 6hrs since foley was D/C and patient did not void. Patient did not complain of any discomfort but upon bladder scan, 459cc was being retained. Nurse notified MD who instructed to keep monitoring and not in-and-out cath until patient complains of discomfort. Will continue to monitor.

## 2018-10-01 NOTE — Discharge Instructions (Signed)
Discharge Instructions  No restriction in activities, slowly increase your activity back to normal.   If your incision becomes itchy or causes discomfort, apply some antibiotic ointment like bacitracin or neosporin.   Okay to shower on the day of discharge. Be gentle when cleaning your incision. Use regular soap and water. If that is uncomfortable, try using baby shampoo. Do not submerge the wound under water for 2 weeks after surgery.  Follow up with Dr. Arnoldo Morale in 2-3 weeks after discharge. If you do not already have a discharge appointment, please call his office at [phone] to schedule a follow up appointment. If you have any concerns or questions, please call the office and let us know.

## 2018-10-02 DIAGNOSIS — I2510 Atherosclerotic heart disease of native coronary artery without angina pectoris: Secondary | ICD-10-CM | POA: Diagnosis present

## 2018-10-02 DIAGNOSIS — E039 Hypothyroidism, unspecified: Secondary | ICD-10-CM | POA: Diagnosis present

## 2018-10-02 DIAGNOSIS — Z79890 Hormone replacement therapy (postmenopausal): Secondary | ICD-10-CM | POA: Diagnosis not present

## 2018-10-02 DIAGNOSIS — E785 Hyperlipidemia, unspecified: Secondary | ICD-10-CM | POA: Diagnosis present

## 2018-10-02 DIAGNOSIS — R339 Retention of urine, unspecified: Secondary | ICD-10-CM | POA: Diagnosis not present

## 2018-10-02 DIAGNOSIS — Z1159 Encounter for screening for other viral diseases: Secondary | ICD-10-CM | POA: Diagnosis not present

## 2018-10-02 DIAGNOSIS — N9989 Other postprocedural complications and disorders of genitourinary system: Secondary | ICD-10-CM | POA: Diagnosis present

## 2018-10-02 DIAGNOSIS — Z87440 Personal history of urinary (tract) infections: Secondary | ICD-10-CM | POA: Diagnosis not present

## 2018-10-02 DIAGNOSIS — Z79899 Other long term (current) drug therapy: Secondary | ICD-10-CM | POA: Diagnosis not present

## 2018-10-02 DIAGNOSIS — J449 Chronic obstructive pulmonary disease, unspecified: Secondary | ICD-10-CM | POA: Diagnosis present

## 2018-10-02 DIAGNOSIS — R338 Other retention of urine: Secondary | ICD-10-CM | POA: Diagnosis present

## 2018-10-02 NOTE — Progress Notes (Signed)
Neurosurgery Service Progress Note  Subjective: No acute events overnight, in & out x1 overnight for bladder scan of ~750 and unable to void, has not urinated this morning yet  Objective: Vitals:   10/01/18 2305 10/02/18 0329 10/02/18 0802 10/02/18 1232  BP: (!) 123/49 (!) 127/52 (!) 119/52 (!) 145/72  Pulse: 72 87 68 75  Resp: 16 16 20 20   Temp: 98.2 F (36.8 C) 98.4 F (36.9 C) 98.1 F (36.7 C) 97.6 F (36.4 C)  TempSrc: Oral Oral Oral Oral  SpO2: 97% 95% 98% 100%  Weight:      Height:       Temp (24hrs), Avg:98.1 F (36.7 C), Min:97.6 F (36.4 C), Max:98.4 F (36.9 C)  CBC Latest Ref Rng & Units 09/30/2018 09/27/2018 09/22/2018  WBC 4.0 - 10.5 K/uL 7.2 9.0 7.0  Hemoglobin 12.0 - 15.0 g/dL 11.1(L) 10.6(L) 13.0  Hematocrit 36.0 - 46.0 % 34.4(L) 32.3(L) 40.5  Platelets 150 - 400 K/uL 187 169 187   BMP Latest Ref Rng & Units 09/30/2018 09/27/2018 09/22/2018  Glucose 70 - 99 mg/dL 150(H) 123(H) 152(H)  BUN 8 - 23 mg/dL 17 27(H) 20  Creatinine 0.44 - 1.00 mg/dL 0.93 1.23(H) 1.28(H)  BUN/Creat Ratio 12 - 28 - - -  Sodium 135 - 145 mmol/L 135 138 138  Potassium 3.5 - 5.1 mmol/L 3.6 4.2 4.1  Chloride 98 - 111 mmol/L 101 106 105  CO2 22 - 32 mmol/L 25 23 25   Calcium 8.9 - 10.3 mg/dL 8.9 8.6(L) 9.0    Intake/Output Summary (Last 24 hours) at 10/02/2018 1412 Last data filed at 10/02/2018 1000 Gross per 24 hour  Intake 840 ml  Output 2501 ml  Net -1661 ml    Current Facility-Administered Medications:  .  acetaminophen (TYLENOL) tablet 650 mg, 650 mg, Oral, Q4H PRN **OR** acetaminophen (TYLENOL) suppository 650 mg, 650 mg, Rectal, Q4H PRN, Bergman, Meghan D, NP .  acidophilus (RISAQUAD) capsule 1 capsule, 1 capsule, Oral, QHS, Bergman, Meghan D, NP, 1 capsule at 10/01/18 2110 .  azelastine (ASTELIN) 0.1 % nasal spray 1 spray, 1 spray, Each Nare, Daily, Bergman, Meghan D, NP, 1 spray at 10/02/18 0908 .  bisacodyl (DULCOLAX) suppository 10 mg, 10 mg, Rectal, Daily PRN, Reinaldo Meeker,  Meghan D, NP .  cholecalciferol (VITAMIN D3) tablet 2,000 Units, 2,000 Units, Oral, Daily, Bergman, Meghan D, NP, 2,000 Units at 10/02/18 0908 .  diazepam (VALIUM) tablet 5-10 mg, 5-10 mg, Oral, Q6H PRN, Reinaldo Meeker, Meghan D, NP, 10 mg at 10/02/18 1331 .  HYDROmorphone (DILAUDID) injection 0.5 mg, 0.5 mg, Intravenous, Q2H PRN, Bergman, Meghan D, NP, 0.5 mg at 10/01/18 2240 .  levothyroxine (SYNTHROID) tablet 75 mcg, 75 mcg, Oral, QAC breakfast, Bergman, Meghan D, NP, 75 mcg at 10/02/18 0601 .  loratadine (CLARITIN) tablet 10 mg, 10 mg, Oral, Daily, Bergman, Meghan D, NP, 10 mg at 10/01/18 0936 .  losartan (COZAAR) tablet 100 mg, 100 mg, Oral, Daily, Bergman, Meghan D, NP, 100 mg at 10/02/18 0908 .  multivitamin with minerals tablet 1 tablet, 1 tablet, Oral, QPM, Bergman, Meghan D, NP, 1 tablet at 10/01/18 1816 .  ondansetron (ZOFRAN) tablet 4 mg, 4 mg, Oral, Q6H PRN **OR** ondansetron (ZOFRAN) injection 4 mg, 4 mg, Intravenous, Q6H PRN, Bergman, Meghan D, NP .  oxyCODONE (Oxy IR/ROXICODONE) immediate release tablet 5-10 mg, 5-10 mg, Oral, Q4H PRN, Bergman, Meghan D, NP, 10 mg at 10/02/18 1331 .  polyethylene glycol (MIRALAX / GLYCOLAX) packet 17 g, 17 g, Oral, Daily PRN,  Viona Gilmore D, NP .  sodium phosphate (FLEET) 7-19 GM/118ML enema 1 enema, 1 enema, Rectal, Once PRN, Bergman, Meghan D, NP .  tamsulosin (FLOMAX) capsule 0.4 mg, 0.4 mg, Oral, Daily, [place] A, MD, 0.4 mg at 10/02/18 0908 .  vitamin B-12 (CYANOCOBALAMIN) tablet 500 mcg, 500 mcg, Oral, BID, Bergman, Meghan D, NP, 500 mcg at 10/02/18 0908   Physical Exam: Awake/alert, FCx4, MAEx4 with symmetric strength  Assessment & Plan: 73 y.o. woman s/p L4-5 decompression and instrumented fusion, admitted for post-op urinary retention.  -continue tamsulosin, will likely have to stay overnight until she can void on her own  Judith Part  10/02/18 2:12 PM

## 2018-10-02 NOTE — Plan of Care (Signed)

## 2018-10-02 NOTE — Progress Notes (Signed)
1945: attempted to void on toliet. No results. Bladder scan 938. I/o cath 850.  [name]

## 2018-10-03 NOTE — Progress Notes (Signed)
Pt with d.c order. Discharge instructions reviewed with patient and all questions answered. RN switched foley bag to foley leg bag. Instructed pt on how to empty and care for catheter. Answered all questions. Pt escorted out via wheelchair with all belongings.

## 2018-10-03 NOTE — Progress Notes (Signed)
4:30 am: unable void. Foley catheter inserted as per order. 1250cc clear yellow urine collected. Client resting quietly in bed.  [name]

## 2018-10-03 NOTE — Progress Notes (Signed)
Subjective:    The patient is alert and pleasant.  She is in no apparent distress.  She wants to go home.  She tells me she has had chronic bladder issues  Including urinary retention and recurrent urinary tract infections.   She sees Dr. Mare Ferrari, Urology in [place] for this.  Objective: Vital signs in last 24 hours: Temp:  [97.6 F (36.4 C)-98.2 F (36.8 C)] 97.7 F (36.5 C) (05/26 0731) Pulse Rate:  [66-91] 76 (05/26 0731) Resp:  [20] 20 (05/26 0731) BP: (106-145)/(43-78) 121/55 (05/26 0731) SpO2:  [96 %-100 %] 99 % (05/26 0731) Estimated body mass index is 27.35 kg/m as calculated from the following:   Height as of this encounter: 5' 4.02" (1.626 m).   Weight as of this encounter: 72.3 kg.   Intake/Output from previous day: 05/25 0701 - 05/26 0700 In: 480 [P.O.:480] Out: 3003 [Urine:3000; Stool:3] Intake/Output this shift: No intake/output data recorded.  Physical exam  The patient is alert and pleasant.  Her lower extremity strength is normal.  There is no perineal numbness.  Lab Results: Recent Labs   09/30/18 0936 WBC 7.2 HGB 11.1* HCT 34.4* PLT 187  BMET Recent Labs   09/30/18 0936 NA 135 K 3.6 CL 101 CO2 25 GLUCOSE 150* BUN 17 CREATININE 0.93 CALCIUM 8.9   Studies/Results: No results found.  Assessment/Plan:   Urinary retention: The patient's urinalysis is negative.  She has had chronic bladder issues as above.  We will plan to discharge her later on today with a urinary catheter /leg bag and have her follow-up with Dr. Rogers Blocker as an outpatient.  LOS: 1 day     [name] 10/03/2018, 8:04 AM

## 2018-10-03 NOTE — Discharge Summary (Signed)
Physician Discharge Summary  Patient ID: Julia Deleon MRN: [id number] DOB/AGE: 14-Apr-1946 73 y.o.  Admit date: 09/30/2018 Discharge date: 10/03/2018  Admission Diagnoses:Urinary retention  Discharge Diagnoses:  The same Active Problems:   Postoperative urinary retention   Discharged Condition: good  Hospital Course:  I performed a L4-5 decompression, instrumentation and fusion on the patient on 09/26/2018.  The patient was subsequently discharged.  She was readmitted with urinary retention.   A urinalysis and urine culture were negative. She was in and out catheterized several times and eventually a Foley catheter was reinserted.  She has a history of chronic bladder problems and sees Dr. Eliberto Ivory in [place] for this.  On 10/03/2018 she requested discharge to home.  She was instructed on Foley catheter care and arrangements were made for her to follow-up with Dr. Eliberto Ivory on 10/09/2018.  She was given written and oral discharge instructions.  All her questions were answered.  Consults:  None a Significant Diagnostic Studies:  Urinalysis and cultures Treatments:  Foley catheter Discharge Exam: Blood pressure (!) 121/55, pulse 76, temperature 97.7 F (36.5 C), temperature source Oral, resp. rate 20, height 5' 4.02" (1.626 m), weight 72.3 kg, SpO2 99 %.  the patient is alert and pleasant.  Her wound is healing well.  Her lower extremity strength is normal.  There is no perineal numbness.  Disposition: Home with Foley catheter.  She is to follow-up with her urologist, Dr. Eliberto Ivory , on 10/09/2018.  Discharge Instructions    Call MD for:  difficulty breathing, headache or visual disturbances   Complete by:  As directed    Call MD for:  extreme fatigue   Complete by:  As directed    Call MD for:  hives   Complete by:  As directed    Call MD for:  persistant dizziness or light-headedness   Complete by:  As directed    Call MD for:  persistant nausea and vomiting   Complete by:  As directed     Call MD for:  redness, tenderness, or signs of infection (pain, swelling, redness, odor or green/yellow discharge around incision site)   Complete by:  As directed    Call MD for:  severe uncontrolled pain   Complete by:  As directed    Call MD for:  temperature >100.4   Complete by:  As directed    Diet - low sodium heart healthy   Complete by:  As directed    Discharge instructions   Complete by:  As directed    Call [phone] for a followup appointment. Take a stool softener while you are using pain medications.Follow-up with your urologist, Dr. Rogers Blocker on 10/09/2018   Driving Restrictions   Complete by:  As directed    Do not drive for 2 weeks.   Increase activity slowly   Complete by:  As directed    Lifting restrictions   Complete by:  As directed    Do not lift more than 5 pounds. No excessive bending or twisting.   May shower / Bathe   Complete by:  As directed    Remove the dressing for 3 days after surgery.  You may shower, but leave the incision alone.   No dressing needed   Complete by:  As directed      Allergies as of 10/03/2018      Reactions   Levofloxacin Hives   Methylisothiazolinone Other (See Comments)   Positive on allergy test   Thimerosal Other (See Comments)  Positive on allergy test   Amoxicillin-pot Clavulanate Nausea Only   Did it involve swelling of the face/tongue/throat, SOB, or low BP? No Did it involve sudden or severe rash/hives, skin peeling, or any reaction on the inside of your mouth or nose? No Did you need to seek medical attention at a hospital or doctor's office? Unknown When did it last happen?unknown If all above answers are "NO", may proceed with cephalosporin use.   Colesevelam Hcl Nausea Only   WELCHOL   Latex Rash   Family history of latex allergy   Simvastatin Itching      Medication List    TAKE these medications   acetaminophen 650 MG CR tablet Commonly known as:  TYLENOL Take 650-1,300 mg by mouth every 8  (eight) hours as needed for pain.   Alpha-Lipoic Acid 100 MG Tabs Take 100 mg by mouth daily with lunch.   azelastine 0.1 % nasal spray Commonly known as:  ASTELIN Place 1 spray into both nostrils daily. Use in each nostril as directed   B-12-SL 1000 MCG Subl Generic drug:  Cyanocobalamin Place 500 mcg under the tongue 2 (two) times a day.   chlorzoxazone 500 MG tablet Commonly known as:  PARAFON Take 1 tablet (500 mg total) by mouth 4 (four) times daily as needed for muscle spasms.   CoQ-10 100 MG Caps Take 100 mg by mouth daily.   CRANBERRY PO Take 1 capsule by mouth daily.   DHEA 10 MG Caps Take 10 mg by mouth daily.   diazepam 5 MG tablet Commonly known as:  VALIUM Take 5 mg by mouth every 6 (six) hours as needed.   docusate sodium 100 MG capsule Commonly known as:  COLACE Take 1 capsule (100 mg total) by mouth 2 (two) times daily.   estradiol 0.1 MG/GM vaginal cream Commonly known as:  ESTRACE VAGINAL 0.5 twice weekly --COMPOUND PLS What changed:    how much to take  how to take this  when to take this   fexofenadine 60 MG tablet Commonly known as:  ALLEGRA Take 60 mg by mouth daily.   GLUCOSAMINE CHONDR COMPLEX PO Take 1 tablet by mouth 2 (two) times a day.   levothyroxine 75 MCG tablet Commonly known as:  SYNTHROID Take 75 mcg by mouth daily before breakfast.   losartan 100 MG tablet Commonly known as:  COZAAR Take 1 tablet (100 mg total) by mouth daily.   LUTEIN-ZEAXANTHIN PO Take 1 tablet by mouth at bedtime.   MethylFolate 400 MCG Caps Generic drug:  Levomefolate Glucosamine Take 400 mcg by mouth daily with lunch.   multivitamin with minerals Tabs tablet Take 1 tablet by mouth every evening.   oxyCODONE 5 MG immediate release tablet Commonly known as:  Oxy IR/ROXICODONE Take 1 tablet (5 mg total) by mouth every 4 (four) hours as needed for moderate pain ((score 4 to 6)).   Probiotic Caps Take 1 capsule by mouth at bedtime.    progesterone (bulk) Powd 4% compound- topically What changed:    how much to take  how to take this  when to take this  additional instructions   tamsulosin 0.4 MG Caps capsule Commonly known as:  FLOMAX Take 1 capsule (0.4 mg total) by mouth daily.   triamcinolone 55 MCG/ACT Aero nasal inhaler Commonly known as:  Nasacort Allergy 24HR Place 2 sprays daily into the nose. What changed:    how much to take  when to take this  reasons to take this  TURMERIC PO Take 1 tablet by mouth 2 (two) times a week.   Vitamin D3 50 MCG (2000 UT) Tabs Take 2,000 Units by mouth daily.        Signed: [name] 10/03/2018, 9:01 AM

## 2018-10-09 DIAGNOSIS — R338 Other retention of urine: Secondary | ICD-10-CM | POA: Diagnosis not present

## 2018-10-09 DIAGNOSIS — R3914 Feeling of incomplete bladder emptying: Secondary | ICD-10-CM | POA: Diagnosis not present

## 2018-10-10 DIAGNOSIS — R300 Dysuria: Secondary | ICD-10-CM | POA: Diagnosis not present

## 2018-10-10 DIAGNOSIS — N390 Urinary tract infection, site not specified: Secondary | ICD-10-CM | POA: Diagnosis not present

## 2018-10-11 ENCOUNTER — Ambulatory Visit (INDEPENDENT_AMBULATORY_CARE_PROVIDER_SITE_OTHER): Payer: Medicare Other | Admitting: Family Medicine

## 2018-10-11 ENCOUNTER — Other Ambulatory Visit: Payer: Self-pay

## 2018-10-11 ENCOUNTER — Encounter: Payer: Self-pay | Admitting: Family Medicine

## 2018-10-11 DIAGNOSIS — N183 Chronic kidney disease, stage 3 unspecified: Secondary | ICD-10-CM

## 2018-10-11 DIAGNOSIS — I83893 Varicose veins of bilateral lower extremities with other complications: Secondary | ICD-10-CM

## 2018-10-11 DIAGNOSIS — I2583 Coronary atherosclerosis due to lipid rich plaque: Secondary | ICD-10-CM | POA: Diagnosis not present

## 2018-10-11 DIAGNOSIS — I2510 Atherosclerotic heart disease of native coronary artery without angina pectoris: Secondary | ICD-10-CM | POA: Diagnosis not present

## 2018-10-11 MED ORDER — LEVOTHYROXINE SODIUM 75 MCG PO TABS: 75.0000 ug | ORAL_TABLET | Freq: Every day | ORAL | 3 refills | Status: DC

## 2018-10-11 NOTE — Progress Notes (Signed)
LMP  (LMP Unknown)    Subjective:    Patient ID: Julia Deleon, female    DOB: [birth year], 73 y.o.   MRN: [id number]  HPI: Julia Deleon is a 73 y.o. female  F/u surgery  Telemedicine using audio/video telecommunications for a synchronous communication visit. Today's visit due to COVID-19 isolation precautions I connected with and verified that I am speaking with the correct person using two identifiers.   I discussed the limitations, risks, security and privacy concerns of performing an evaluation and management service by telecommunication and the availability of in person appointments. I also discussed with the patient that there may be a patient responsible charge related to this service. The patient expressed understanding and agreed to proceed. The patient's location is home. I am at home.  Reviewed with patient medications and care.  Patient is followed up with urology and has culture pending is on Septra for now. Patient's other problem is leg edema as she is not been able to wear compression hose.  This is because she has had a catheter. Transition of Care Hospital Follow up.   Hospital/Facility:[facility] D/C Physician: Arnoldo Morale D/C Date: 10-03-18  Records Requested: na Records Received: na Records Reviewed: done  Diagnoses on Discharge: UTI  Date of interactive Contact within 48 hours of discharge: done Contact was through: phone  Date of 7 day or 14 day face-to-face visit:    10-11-18  Outpatient Encounter Medications as of 10/11/2018  Medication Sig  . acetaminophen (TYLENOL) 650 MG CR tablet Take 650-1,300 mg by mouth every 8 (eight) hours as needed for pain.   . Alpha-Lipoic Acid 100 MG TABS Take 100 mg by mouth daily with lunch.  Marland Kitchen azelastine (ASTELIN) 0.1 % nasal spray Place 1 spray into both nostrils daily. Use in each nostril as directed  . chlorzoxazone (PARAFON) 500 MG tablet Take 1 tablet (500 mg total) by mouth 4 (four) times daily as needed for muscle spasms.  (Patient not taking: Reported on 09/30/2018)  . Cholecalciferol (VITAMIN D3) 2000 units TABS Take 2,000 Units by mouth daily.   . Coenzyme Q10 (COQ-10) 100 MG CAPS Take 100 mg by mouth daily.  Marland Kitchen CRANBERRY PO Take 1 capsule by mouth daily.  . Cyanocobalamin (B-12-SL) 1000 MCG SUBL Place 500 mcg under the tongue 2 (two) times a day.  Marland Kitchen DHEA 10 MG CAPS Take 10 mg by mouth daily.  . diazepam (VALIUM) 5 MG tablet Take 5 mg by mouth every 6 (six) hours as needed.  . docusate sodium (COLACE) 100 MG capsule Take 1 capsule (100 mg total) by mouth 2 (two) times daily.  Marland Kitchen estradiol (ESTRACE VAGINAL) 0.1 MG/GM vaginal cream 0.5 twice weekly --COMPOUND PLS (Patient taking differently: Place 1 Applicatorful vaginally 3 (three) times a week. 0.5 twice weekly --COMPOUND PLS)  . fexofenadine (ALLEGRA) 60 MG tablet Take 60 mg by mouth daily.  . Glucosamine-Chondroitin (GLUCOSAMINE CHONDR COMPLEX PO) Take 1 tablet by mouth 2 (two) times a day.  . Levomefolate Glucosamine (METHYLFOLATE) 400 MCG CAPS Take 400 mcg by mouth daily with lunch.  . levothyroxine (SYNTHROID) 75 MCG tablet Take 1 tablet (75 mcg total) by mouth daily before breakfast.  . losartan (COZAAR) 100 MG tablet Take 1 tablet (100 mg total) by mouth daily.  . Multiple Vitamin (MULTIVITAMIN WITH MINERALS) TABS tablet Take 1 tablet by mouth every evening.  . Multiple Vitamins-Minerals (LUTEIN-ZEAXANTHIN PO) Take 1 tablet by mouth at bedtime.  . Probiotic CAPS Take 1 capsule by mouth at  bedtime.  . Progesterone Micronized (PROGESTERONE, BULK,) POWD 4% compound- topically (Patient taking differently: Apply 1 application topically See admin instructions. 4% compound- topically, apply topically daily except Sundays)  . tamsulosin (FLOMAX) 0.4 MG CAPS capsule Take 1 capsule (0.4 mg total) by mouth daily.  Marland Kitchen triamcinolone (NASACORT ALLERGY 24HR) 55 MCG/ACT AERO nasal inhaler Place 2 sprays daily into the nose. (Patient taking differently: Place 1 spray into the  nose daily as needed. )  . TURMERIC PO Take 1 tablet by mouth 2 (two) times a week.  . [DISCONTINUED] levothyroxine (SYNTHROID) 75 MCG tablet Take 75 mcg by mouth daily before breakfast.  . [DISCONTINUED] oxyCODONE (OXY IR/ROXICODONE) 5 MG immediate release tablet Take 1 tablet (5 mg total) by mouth every 4 (four) hours as needed for moderate pain ((score 4 to 6)).   No facility-administered encounter medications on file as of 10/11/2018.     Diagnostic Tests Reviewed/Disposition: done  Consults:na  Discharge Instructionsreviewed  Disease/illness Education:done  Home Health/Community Services Discussions/Referrals:na  Establishment or re-establishment of referral orders for community resources:na  Discussion with other health care providers: na  Assessment and Support of treatment regimen adherence:na  Appointments Coordinated with: na  Education for self-management, independent living, and ADLs: na Relevant past medical, surgical, family and social history reviewed and updated as indicated. Interim medical history since our last visit reviewed. Allergies and medications reviewed and updated.  Review of Systems  Constitutional: Negative.   Respiratory: Negative.   Cardiovascular: Negative.     Per HPI unless specifically indicated above     Objective:    LMP  (LMP Unknown)   Wt Readings from Last 3 Encounters:  09/30/18 159 lb 6.3 oz (72.3 kg)  09/26/18 159 lb 4 oz (72.2 kg)  09/22/18 159 lb 4 oz (72.2 kg)    Physical Exam  Results for orders placed or performed during the hospital encounter of 09/30/18  Urine culture  Result Value Ref Range   Specimen Description URINE, CLEAN CATCH    Special Requests NONE    Culture (A)     <10,000 COLONIES/mL INSIGNIFICANT GROWTH Performed at [facility], [place] [address] [place], [place] [address]    Report Status 10/01/2018 FINAL   SARS Coronavirus 2 (CEPHEID - Performed in [place] hospital lab), [facility] Order   Result Value Ref Range   SARS Coronavirus 2 NEGATIVE NEGATIVE  CBC with Differential  Result Value Ref Range   WBC 7.2 4.0 - 10.5 K/uL   RBC 3.60 (L) 3.87 - 5.11 MIL/uL   Hemoglobin 11.1 (L) 12.0 - 15.0 g/dL   HCT 34.4 (L) 36.0 - 46.0 %   MCV 95.6 80.0 - 100.0 fL   MCH 30.8 26.0 - 34.0 pg   MCHC 32.3 30.0 - 36.0 g/dL   RDW 13.1 11.5 - 15.5 %   Platelets 187 150 - 400 K/uL   nRBC 0.0 0.0 - 0.2 %   Neutrophils Relative % 70 %   Neutro Abs 5.1 1.7 - 7.7 K/uL   Lymphocytes Relative 18 %   Lymphs Abs 1.3 0.7 - 4.0 K/uL   Monocytes Relative 8 %   Monocytes Absolute 0.6 0.1 - 1.0 K/uL   Eosinophils Relative 2 %   Eosinophils Absolute 0.2 0.0 - 0.5 K/uL   Basophils Relative 0 %   Basophils Absolute 0.0 0.0 - 0.1 K/uL   Immature Granulocytes 2 %   Abs Immature Granulocytes 0.12 (H) 0.00 - 0.07 K/uL  Basic metabolic panel  Result Value  Ref Range   Sodium 135 135 - 145 mmol/L   Potassium 3.6 3.5 - 5.1 mmol/L   Chloride 101 98 - 111 mmol/L   CO2 25 22 - 32 mmol/L   Glucose, Bld 150 (H) 70 - 99 mg/dL   BUN 17 8 - 23 mg/dL   Creatinine, Ser 0.93 0.44 - 1.00 mg/dL   Calcium 8.9 8.9 - 10.3 mg/dL   GFR calc non Af Amer >60 >60 mL/min   GFR calc Af Amer >60 >60 mL/min   Anion gap 9 5 - 15  Urinalysis, Routine w reflex microscopic  Result Value Ref Range   Color, Urine YELLOW YELLOW   APPearance HAZY (A) CLEAR   Specific Gravity, Urine 1.020 1.005 - 1.030   pH 5.0 5.0 - 8.0   Glucose, UA 50 (A) NEGATIVE mg/dL   Hgb urine dipstick NEGATIVE NEGATIVE   Bilirubin Urine NEGATIVE NEGATIVE   Ketones, ur NEGATIVE NEGATIVE mg/dL   Protein, ur NEGATIVE NEGATIVE mg/dL   Nitrite NEGATIVE NEGATIVE   Leukocytes,Ua NEGATIVE NEGATIVE      Assessment & Plan:   Problem List Items Addressed This Visit      Cardiovascular and Mediastinum   Varicose veins of both lower extremities with complications    Discussed compression hose care and treatment elevation and use of her hose.  If problems with  continued fluid will consider Lasix        Genitourinary   Chronic kidney disease, stage III (moderate) (HCC)    Discussed and reviewed polypharmacy and try to limit during this stage of healing           I discussed the assessment and treatment plan with the patient. The patient was provided an opportunity to ask questions and all were answered. The patient agreed with the plan and demonstrated an understanding of the instructions.   The patient was advised to call back or seek an in-person evaluation if the symptoms worsen or if the condition fails to improve as anticipated.   I provided 21+ minutes of time during this encounter. Follow up plan: Return in about 2 weeks (around 10/25/2018).

## 2018-10-11 NOTE — Assessment & Plan Note (Signed)
Discussed and reviewed polypharmacy and try to limit during this stage of healing

## 2018-10-11 NOTE — Assessment & Plan Note (Signed)
Discussed compression hose care and treatment elevation and use of her hose.  If problems with continued fluid will consider Lasix

## 2018-10-12 ENCOUNTER — Ambulatory Visit: Payer: Medicare Other | Admitting: Family Medicine

## 2018-10-16 DIAGNOSIS — R350 Frequency of micturition: Secondary | ICD-10-CM | POA: Diagnosis not present

## 2018-10-16 DIAGNOSIS — R338 Other retention of urine: Secondary | ICD-10-CM | POA: Diagnosis not present

## 2018-10-16 DIAGNOSIS — N390 Urinary tract infection, site not specified: Secondary | ICD-10-CM | POA: Diagnosis not present

## 2018-10-17 DIAGNOSIS — R350 Frequency of micturition: Secondary | ICD-10-CM | POA: Diagnosis not present

## 2018-10-17 DIAGNOSIS — N390 Urinary tract infection, site not specified: Secondary | ICD-10-CM | POA: Diagnosis not present

## 2018-10-17 DIAGNOSIS — R338 Other retention of urine: Secondary | ICD-10-CM | POA: Diagnosis not present

## 2018-10-17 DIAGNOSIS — R3914 Feeling of incomplete bladder emptying: Secondary | ICD-10-CM | POA: Diagnosis not present

## 2018-10-18 DIAGNOSIS — R338 Other retention of urine: Secondary | ICD-10-CM | POA: Diagnosis not present

## 2018-10-18 DIAGNOSIS — R300 Dysuria: Secondary | ICD-10-CM | POA: Diagnosis not present

## 2018-10-20 DIAGNOSIS — M545 Low back pain: Secondary | ICD-10-CM | POA: Diagnosis not present

## 2018-10-20 DIAGNOSIS — M4316 Spondylolisthesis, lumbar region: Secondary | ICD-10-CM | POA: Diagnosis not present

## 2018-10-23 DIAGNOSIS — R600 Localized edema: Secondary | ICD-10-CM | POA: Diagnosis not present

## 2018-10-23 DIAGNOSIS — N183 Chronic kidney disease, stage 3 (moderate): Secondary | ICD-10-CM | POA: Diagnosis not present

## 2018-10-23 DIAGNOSIS — I10 Essential (primary) hypertension: Secondary | ICD-10-CM | POA: Diagnosis not present

## 2018-10-26 DIAGNOSIS — I83893 Varicose veins of bilateral lower extremities with other complications: Secondary | ICD-10-CM | POA: Diagnosis not present

## 2018-10-30 DIAGNOSIS — H43813 Vitreous degeneration, bilateral: Secondary | ICD-10-CM | POA: Diagnosis not present

## 2018-10-31 DIAGNOSIS — L578 Other skin changes due to chronic exposure to nonionizing radiation: Secondary | ICD-10-CM | POA: Diagnosis not present

## 2018-10-31 DIAGNOSIS — L718 Other rosacea: Secondary | ICD-10-CM | POA: Diagnosis not present

## 2018-10-31 DIAGNOSIS — L821 Other seborrheic keratosis: Secondary | ICD-10-CM | POA: Diagnosis not present

## 2018-10-31 DIAGNOSIS — Z1283 Encounter for screening for malignant neoplasm of skin: Secondary | ICD-10-CM | POA: Diagnosis not present

## 2018-10-31 DIAGNOSIS — L812 Freckles: Secondary | ICD-10-CM | POA: Diagnosis not present

## 2018-11-01 DIAGNOSIS — R3914 Feeling of incomplete bladder emptying: Secondary | ICD-10-CM | POA: Diagnosis not present

## 2018-11-01 DIAGNOSIS — R338 Other retention of urine: Secondary | ICD-10-CM | POA: Diagnosis not present

## 2018-11-06 ENCOUNTER — Other Ambulatory Visit: Payer: Self-pay

## 2018-11-06 ENCOUNTER — Ambulatory Visit (INDEPENDENT_AMBULATORY_CARE_PROVIDER_SITE_OTHER): Payer: Medicare Other | Admitting: Family Medicine

## 2018-11-06 ENCOUNTER — Encounter: Payer: Self-pay | Admitting: Family Medicine

## 2018-11-06 DIAGNOSIS — I2510 Atherosclerotic heart disease of native coronary artery without angina pectoris: Secondary | ICD-10-CM | POA: Diagnosis not present

## 2018-11-06 DIAGNOSIS — M5442 Lumbago with sciatica, left side: Secondary | ICD-10-CM | POA: Diagnosis not present

## 2018-11-06 DIAGNOSIS — I2583 Coronary atherosclerosis due to lipid rich plaque: Secondary | ICD-10-CM

## 2018-11-06 DIAGNOSIS — G8929 Other chronic pain: Secondary | ICD-10-CM

## 2018-11-06 DIAGNOSIS — N3000 Acute cystitis without hematuria: Secondary | ICD-10-CM | POA: Diagnosis not present

## 2018-11-06 MED ORDER — NITROFURANTOIN MONOHYD MACRO 100 MG PO CAPS: 100.0000 mg | ORAL_CAPSULE | Freq: Two times a day (BID) | ORAL | 0 refills | Status: DC

## 2018-11-06 NOTE — Assessment & Plan Note (Signed)
S/p surgery to back and has also PT scheduled for bladder.

## 2018-11-06 NOTE — Progress Notes (Signed)
LMP  (LMP Unknown)    Subjective:    Patient ID: Julia Deleon, female    DOB: 07-11-1945, 73 y.o.   MRN: [id number]  HPI: Julia Deleon is a 73 y.o. female  Med check Recurrent UTI  Has self catheter 3 times a day.  Also has mult muscle leg spasms. Recommended PT Also has recommended bladder PT.   Relevant past medical, surgical, family and social history reviewed and updated as indicated. Interim medical history since our last visit reviewed. Allergies and medications reviewed and updated.  Review of Systems  Constitutional: Negative.   Respiratory: Negative.   Cardiovascular: Negative.     Per HPI unless specifically indicated above     Objective:    LMP  (LMP Unknown)   Wt Readings from Last 3 Encounters:  09/30/18 159 lb 6.3 oz (72.3 kg)  09/26/18 159 lb 4 oz (72.2 kg)  09/22/18 159 lb 4 oz (72.2 kg)    Physical Exam  Results for orders placed or performed during the hospital encounter of 09/30/18  Urine culture   Specimen: Urine, Clean Catch  Result Value Ref Range   Specimen Description URINE, CLEAN CATCH    Special Requests NONE    Culture (A)     <10,000 COLONIES/mL INSIGNIFICANT GROWTH Performed at [facility], [place] [address] [place], [place] [address]    Report Status 10/01/2018 FINAL   SARS Coronavirus 2 (CEPHEID - Performed in [place] hospital lab), [facility] Order   Specimen: Nasopharyngeal Swab  Result Value Ref Range   SARS Coronavirus 2 NEGATIVE NEGATIVE  CBC with Differential  Result Value Ref Range   WBC 7.2 4.0 - 10.5 K/uL   RBC 3.60 (L) 3.87 - 5.11 MIL/uL   Hemoglobin 11.1 (L) 12.0 - 15.0 g/dL   HCT 34.4 (L) 36.0 - 46.0 %   MCV 95.6 80.0 - 100.0 fL   MCH 30.8 26.0 - 34.0 pg   MCHC 32.3 30.0 - 36.0 g/dL   RDW 13.1 11.5 - 15.5 %   Platelets 187 150 - 400 K/uL   nRBC 0.0 0.0 - 0.2 %   Neutrophils Relative % 70 %   Neutro Abs 5.1 1.7 - 7.7 K/uL   Lymphocytes Relative 18 %   Lymphs Abs 1.3 0.7 - 4.0 K/uL   Monocytes Relative 8  %   Monocytes Absolute 0.6 0.1 - 1.0 K/uL   Eosinophils Relative 2 %   Eosinophils Absolute 0.2 0.0 - 0.5 K/uL   Basophils Relative 0 %   Basophils Absolute 0.0 0.0 - 0.1 K/uL   Immature Granulocytes 2 %   Abs Immature Granulocytes 0.12 (H) 0.00 - 0.07 K/uL  Basic metabolic panel  Result Value Ref Range   Sodium 135 135 - 145 mmol/L   Potassium 3.6 3.5 - 5.1 mmol/L   Chloride 101 98 - 111 mmol/L   CO2 25 22 - 32 mmol/L   Glucose, Bld 150 (H) 70 - 99 mg/dL   BUN 17 8 - 23 mg/dL   Creatinine, Ser 0.93 0.44 - 1.00 mg/dL   Calcium 8.9 8.9 - 10.3 mg/dL   GFR calc non Af Amer >60 >60 mL/min   GFR calc Af Amer >60 >60 mL/min   Anion gap 9 5 - 15  Urinalysis, Routine w reflex microscopic  Result Value Ref Range   Color, Urine YELLOW YELLOW   APPearance HAZY (A) CLEAR   Specific Gravity, Urine 1.020 1.005 - 1.030   pH 5.0 5.0 -  8.0   Glucose, UA 50 (A) NEGATIVE mg/dL   Hgb urine dipstick NEGATIVE NEGATIVE   Bilirubin Urine NEGATIVE NEGATIVE   Ketones, ur NEGATIVE NEGATIVE mg/dL   Protein, ur NEGATIVE NEGATIVE mg/dL   Nitrite NEGATIVE NEGATIVE   Leukocytes,Ua NEGATIVE NEGATIVE      Assessment & Plan:   Problem List Items Addressed This Visit      Other   Low back pain    S/p surgery to back and has also PT scheduled for bladder.       Other Visit Diagnoses    Acute cystitis without hematuria    -  Primary     Telemedicine using audio/video telecommunications for a synchronous communication visit. Today's visit due to COVID-19 isolation precautions I connected with and verified that I am speaking with the correct person using two identifiers.   I discussed the limitations, risks, security and privacy concerns of performing an evaluation and management service by telecommunication and the availability of in person appointments. I also discussed with the patient that there may be a patient responsible charge related to this service. The patient expressed understanding and  agreed to proceed. The patient's location is I am at home.   I discussed the assessment and treatment plan with the patient. The patient was provided an opportunity to ask questions and all were answered. The patient agreed with the plan and demonstrated an understanding of the instructions.   The patient was advised to call back or seek an in-person evaluation if the symptoms worsen or if the condition fails to improve as anticipated.   I provided 21+ minutes of time during this encounter.  Follow up plan: Return in about 4 weeks (around 12/04/2018).

## 2018-11-13 ENCOUNTER — Encounter: Payer: Self-pay | Admitting: Obstetrics & Gynecology

## 2018-11-13 ENCOUNTER — Other Ambulatory Visit (HOSPITAL_COMMUNITY)
Admission: RE | Admit: 2018-11-13 | Discharge: 2018-11-13 | Disposition: A | Discharge status: Home / Self Care | Payer: Medicare Other | Source: Ambulatory Visit | Attending: Obstetrics & Gynecology | Admitting: Obstetrics & Gynecology

## 2018-11-13 ENCOUNTER — Ambulatory Visit (INDEPENDENT_AMBULATORY_CARE_PROVIDER_SITE_OTHER): Payer: Medicare Other | Admitting: Obstetrics & Gynecology

## 2018-11-13 ENCOUNTER — Other Ambulatory Visit: Payer: Self-pay

## 2018-11-13 VITALS — BP 130/80 | Ht 64.0 in | Wt 160.0 lb

## 2018-11-13 DIAGNOSIS — Z1231 Encounter for screening mammogram for malignant neoplasm of breast: Secondary | ICD-10-CM

## 2018-11-13 DIAGNOSIS — Z124 Encounter for screening for malignant neoplasm of cervix: Secondary | ICD-10-CM | POA: Diagnosis not present

## 2018-11-13 DIAGNOSIS — Z01419 Encounter for gynecological examination (general) (routine) without abnormal findings: Secondary | ICD-10-CM

## 2018-11-13 DIAGNOSIS — N952 Postmenopausal atrophic vaginitis: Secondary | ICD-10-CM

## 2018-11-13 DIAGNOSIS — N951 Menopausal and female climacteric states: Secondary | ICD-10-CM

## 2018-11-13 MED ORDER — ESTRADIOL 0.1 MG/GM VA CREA: 1.0000 | TOPICAL_CREAM | VAGINAL | 11 refills | Status: DC

## 2018-11-13 MED ORDER — PROGESTERONE MICRONIZED POWD: 1.0000 "application " | 11 refills | Status: DC

## 2018-11-13 NOTE — Patient Instructions (Signed)
Atrophic Vaginitis  Atrophic vaginitis is a condition in which the tissues that line the vagina become dry and thin. This condition is most common in women who have stopped having regular menstrual periods (are in menopause). This usually starts when a woman is 48-71 years old. That is the time when a woman's estrogen levels begin to drop (decrease). Estrogen is a female hormone. It helps to keep the tissues of the vagina moist. It stimulates the vagina to produce a clear fluid that lubricates the vagina for sexual intercourse. This fluid also protects the vagina from infection. Lack of estrogen can cause the lining of the vagina to get thinner and dryer. The vagina may also shrink in size. It may become less elastic. Atrophic vaginitis tends to get worse over time as a woman's estrogen level drops. What are the causes? This condition is caused by the normal drop in estrogen that happens around the time of menopause. What increases the risk? Certain conditions or situations may lower a woman's estrogen level, leading to a higher risk for atrophic vaginitis. You are more likely to develop this condition if:  You are taking medicines that block estrogen.  You have had your ovaries removed.  You are being treated for cancer with X-ray (radiation) or medicines (chemotherapy).  You have given birth or are breastfeeding.  You are older than age 74.  You smoke. What are the signs or symptoms? Symptoms of this condition include:  Pain, soreness, or bleeding during sexual intercourse (dyspareunia).  Vaginal burning, irritation, or itching.  Pain or bleeding when a speculum is used in a vaginal exam (pelvic exam).  Having burning pain when passing urine.  Vaginal discharge that is brown or yellow. In some cases, there are no symptoms. How is this diagnosed? This condition is diagnosed by taking a medical history and doing a physical exam. This will include a pelvic exam that checks the  vaginal tissues. Though rare, you may also have other tests, including:  A urine test.  A test that checks the acid balance in your vagina (acid balance test). How is this treated? Treatment for this condition depends on how severe your symptoms are. Treatment may include:  Using an over-the-counter vaginal lubricant before sex.  Using a long-acting vaginal moisturizer. - REPLENS 2-3 times weekly  Using low-dose vaginal estrogen for moderate to severe symptoms that do not respond to other treatments. Options include creams, tablets, and inserts (vaginal rings). Before you use a vaginal estrogen, tell your health care provider if you have a history of: ? Breast cancer. ? Endometrial cancer. ? Blood clots. If you are not sexually active and your symptoms are very mild, you may not need treatment. Follow these instructions at home: Medicines  Take over-the-counter and prescription medicines only as told by your health care provider. Do not use herbal or alternative medicines unless your health care provider says that you can.  Use over-the-counter creams, lubricants, or moisturizers for dryness only as directed by your health care provider. General instructions  If your atrophic vaginitis is caused by menopause, discuss all of your menopause symptoms and treatment options with your health care provider.  Do not douche.  Do not use products that can make your vagina dry. These include: ? Scented feminine sprays. ? Scented tampons. ? Scented soaps.  Vaginal intercourse can help to improve blood flow and elasticity of vaginal tissue. If it hurts to have sex, try using a lubricant or moisturizer just before having intercourse. Contact a  health care provider if:  Your discharge looks different than normal.  Your vagina has an unusual smell.  You have new symptoms.  Your symptoms do not improve with treatment.  Your symptoms get worse. Summary  Atrophic vaginitis is a condition  in which the tissues that line the vagina become dry and thin. It is most common in women who have stopped having regular menstrual periods (are in menopause).  Treatment options include using vaginal lubricants and low-dose vaginal estrogen.  Contact a health care provider if your vagina has an unusual smell, or if your symptoms get worse or do not improve after treatment. This information is not intended to replace advice given to you by your health care provider. Make sure you discuss any questions you have with your health care provider. Document Released: 09/10/2014 Document Revised: 04/08/2017 Document Reviewed: 01/20/2017 Elsevier Patient Education  2020 [employer].

## 2018-11-13 NOTE — Progress Notes (Signed)
HPI:      [name] is a 73 y.o. [id number] who LMP was in the past, she presents today for her annual examination.  The patient has no complaints today. The patient is not currently sexually active, as just had back surgery and husband had surgery as well; although in past she has been w some dyspareunia related to dryness. Herlast pap: approximate date 2017 and was normal and last mammogram: approximate date 2019 and was normal.  The patient does perform self breast exams.  There is notable family history of breast or ovarian cancer in her family. The patient is taking bioidentical hormone replacement therapy, for many years, still notes hot flashes when tries to come off.  Patient denies post-menopausal vaginal bleeding.   The patient has regular exercise: yes. The patient denies current symptoms of depression.    Recent back surgery, recovering well. Sees PT.    Has had worsening urinary retention since surgery.  Sees Dr Julia Deleon.  Intermittent self cathaterization.  GYN Hx: Last Colonoscopy:5 years ago. Normal.  Last DEXA: never ago.    PMHx: Past Medical History:  Diagnosis Date   Arthritis    COPD (chronic obstructive pulmonary disease) ([place])    Family history of adverse reaction to anesthesia    father was slow to wake up   Fuch's endothelial dystrophy    Hyperlipidemia    Hypothyroidism    PONV (postoperative nausea and vomiting)    slow to wake up and PONV   Past Surgical History:  Procedure Laterality Date   APPENDECTOMY     BACK SURGERY  09/26/2018   L4-5 PLIF by Dr. Arnoldo Deleon   CARDIAC CATHETERIZATION     CHOLECYSTECTOMY     EYE SURGERY     FOOT SURGERY Right    pin removed left   laser vein surgery     NASAL SINUS SURGERY     RIGHT/LEFT HEART CATH AND CORONARY ANGIOGRAPHY N/A 12/06/2017   Procedure: RIGHT/LEFT HEART CATH AND CORONARY ANGIOGRAPHY;  Surgeon: Julia Kida, MD;  Location: [facility];  Service: Cardiovascular;  Laterality:  N/A;   TONSILLECTOMY     Family History  Problem Relation Age of Onset   Cancer Mother        lung   CAD Mother    Stroke Father    Cancer Sister        breast   Breast cancer Sister 89   Varicose Veins Son    Heart disease Maternal Grandmother    Cancer Paternal Grandmother        throat   Emphysema Paternal Grandfather    Fibromyalgia Sister    Breast cancer Paternal Aunt    Social History   Tobacco Use   Smoking status: Never Smoker   Smokeless tobacco: Never Used  Substance Use Topics   Alcohol use: No    Alcohol/week: 0.0 standard drinks   Drug use: No    Current Outpatient Medications:    acetaminophen (TYLENOL) 650 MG CR tablet, Take 650-1,300 mg by mouth every 8 (eight) hours as needed for pain. , Disp: , Rfl:    Alpha-Lipoic Acid 100 MG TABS, Take 100 mg by mouth daily with lunch., Disp: , Rfl:    azelastine (ASTELIN) 0.1 % nasal spray, Place 1 spray into both nostrils daily. Use in each nostril as directed, Disp: , Rfl:    chlorzoxazone (PARAFON) 500 MG tablet, Take 1 tablet (500 mg total) by mouth 4 (four) times daily  as needed for muscle spasms., Disp: 30 tablet, Rfl: 2   Cholecalciferol (VITAMIN D3) 2000 units TABS, Take 2,000 Units by mouth daily. , Disp: , Rfl:    Coenzyme Q10 (COQ-10) 100 MG CAPS, Take 100 mg by mouth daily., Disp: , Rfl:    CRANBERRY PO, Take 1 capsule by mouth daily., Disp: , Rfl:    Cyanocobalamin (B-12-SL) 1000 MCG SUBL, Place 500 mcg under the tongue 2 (two) times a day., Disp: , Rfl:    DHEA 10 MG CAPS, Take 10 mg by mouth daily., Disp: , Rfl:    diazepam (VALIUM) 5 MG tablet, Take 5 mg by mouth every 6 (six) hours as needed., Disp: , Rfl:    docusate sodium (COLACE) 100 MG capsule, Take 1 capsule (100 mg total) by mouth 2 (two) times daily., Disp: 60 capsule, Rfl: 0   estradiol (ESTRACE VAGINAL) 0.1 MG/GM vaginal cream, Place 1 Applicatorful vaginally 3 (three) times a week. 0.5 twice weekly --COMPOUND PLS,  Disp: 42.5 g, Rfl: 11   fexofenadine (ALLEGRA) 60 MG tablet, Take 60 mg by mouth daily., Disp: , Rfl:    Glucosamine-Chondroitin (GLUCOSAMINE CHONDR COMPLEX PO), Take 1 tablet by mouth 2 (two) times a day., Disp: , Rfl:    Levomefolate Glucosamine (METHYLFOLATE) 400 MCG CAPS, Take 400 mcg by mouth daily with lunch., Disp: , Rfl:    levothyroxine (SYNTHROID) 75 MCG tablet, Take 1 tablet (75 mcg total) by mouth daily before breakfast., Disp: 90 tablet, Rfl: 3   losartan (COZAAR) 100 MG tablet, Take 1 tablet (100 mg total) by mouth daily., Disp: 90 tablet, Rfl: 2   Multiple Vitamin (MULTIVITAMIN WITH MINERALS) TABS tablet, Take 1 tablet by mouth every evening., Disp: , Rfl:    Multiple Vitamins-Minerals (LUTEIN-ZEAXANTHIN PO), Take 1 tablet by mouth at bedtime., Disp: , Rfl:    nitrofurantoin, macrocrystal-monohydrate, (MACROBID) 100 MG capsule, Take 1 capsule (100 mg total) by mouth 2 (two) times daily., Disp: 20 capsule, Rfl: 0   Probiotic CAPS, Take 1 capsule by mouth at bedtime., Disp: , Rfl:    Progesterone Micronized (PROGESTERONE, BULK,) POWD, Apply 1 application topically See admin instructions. 4% compound- topically, Disp: 100 g, Rfl: 11   tamsulosin (FLOMAX) 0.4 MG CAPS capsule, Take 1 capsule (0.4 mg total) by mouth daily., Disp: 30 capsule, Rfl: 0   triamcinolone (NASACORT ALLERGY 24HR) 55 MCG/ACT AERO nasal inhaler, Place 2 sprays daily into the nose. (Patient taking differently: Place 1 spray into the nose daily as needed. ), Disp: 1 Inhaler, Rfl: 12   TURMERIC PO, Take 1 tablet by mouth 2 (two) times a week., Disp: , Rfl:  Allergies: Levofloxacin, Methylisothiazolinone, Thimerosal, Amoxicillin-pot clavulanate, Colesevelam hcl, Latex, and Simvastatin  Review of Systems  Constitutional: Negative for chills, fever and malaise/fatigue.  HENT: Negative for congestion, sinus pain and sore throat.   Eyes: Negative for blurred vision and pain.  Respiratory: Negative for cough  and wheezing.   Cardiovascular: Negative for chest pain and leg swelling.  Gastrointestinal: Negative for abdominal pain, constipation, diarrhea, heartburn, nausea and vomiting.  Genitourinary: Positive for frequency. Negative for dysuria, hematuria and urgency.  Musculoskeletal: Negative for back pain, joint pain, myalgias and neck pain.  Skin: Negative for itching and rash.  Neurological: Negative for dizziness, tremors and weakness.  Endo/Heme/Allergies: Does not bruise/bleed easily.  Psychiatric/Behavioral: Negative for depression. The patient is not nervous/anxious and does not have insomnia.     Objective: BP 130/80    Ht 5\' 4"  (1.626 m)  Wt 160 lb (72.6 kg)    LMP  (LMP Unknown)    BMI 27.46 kg/m   Filed Weights   11/13/18 0852  Weight: 160 lb (72.6 kg)   Body mass index is 27.46 kg/m. Physical Exam Constitutional:      General: She is not in acute distress.    Appearance: She is well-developed.  Genitourinary:     Pelvic exam was performed with patient supine.     Vagina, uterus and rectum normal.     No lesions in the vagina.     No vaginal bleeding.     No cervical motion tenderness, friability, lesion or polyp.     Uterus is mobile.     Uterus is not enlarged.     No uterine mass detected.    Uterus is midaxial.     No right or left adnexal mass present.     Right adnexa not tender.     Left adnexa not tender.  HENT:     Head: Normocephalic and atraumatic. No laceration.     Right Ear: Hearing normal.     Left Ear: Hearing normal.     Mouth/Throat:     Pharynx: Uvula midline.  Eyes:     Pupils: Pupils are equal, round, and reactive to light.  Neck:     Musculoskeletal: Normal range of motion and neck supple.     Thyroid: No thyromegaly.  Cardiovascular:     Rate and Rhythm: Normal rate and regular rhythm.     Heart sounds: No murmur. No friction rub. No gallop.   Pulmonary:     Effort: Pulmonary effort is normal. No respiratory distress.     Breath  sounds: Normal breath sounds. No wheezing.  Chest:     Breasts:        Right: No mass, skin change or tenderness.        Left: No mass, skin change or tenderness.  Abdominal:     General: Bowel sounds are normal. There is no distension.     Palpations: Abdomen is soft.     Tenderness: There is no abdominal tenderness. There is no rebound.  Musculoskeletal: Normal range of motion.  Neurological:     Mental Status: She is alert and oriented to person, place, and time.     Cranial Nerves: No cranial nerve deficit.  Skin:    General: Skin is warm and dry.  Psychiatric:        Judgment: Judgment normal.  Vitals signs reviewed.     Assessment: Annual Exam 1. Women's annual routine gynecological examination   2. Screening for cervical cancer   3. Menopausal hot flushes   4. Vaginal atrophy   5. Encounter for mammogram to establish baseline mammogram     Plan:            1.  Cervical Screening-  Pap smear done today  2. Breast screening- Exam annually and mammogram scheduled  3. Colonoscopy every 10 years, Hemoccult testing after age 26  4. Labs managed by PCP  5. Counseling for hormonal therapy: no change in therapy today Refills at [facility] for estradiol, progesterone              6. FRAX - FRAX score for assessing the 10 year probability for fracture calculated and discussed today.  Based on age and score today, DEXA is not currently scheduled.  7. Vaginal Atrophy Options discussed.  Replens.     F/U  Return in about 1  year (around 11/13/2019) for Annual.  Barnett Applebaum, MD, [facility], [facility] 11/13/2018  9:47 AM

## 2018-11-14 ENCOUNTER — Ambulatory Visit: Payer: Medicare Other | Attending: Urology

## 2018-11-14 ENCOUNTER — Other Ambulatory Visit: Payer: Self-pay

## 2018-11-14 DIAGNOSIS — R293 Abnormal posture: Secondary | ICD-10-CM | POA: Insufficient documentation

## 2018-11-14 DIAGNOSIS — M62838 Other muscle spasm: Secondary | ICD-10-CM | POA: Insufficient documentation

## 2018-11-14 DIAGNOSIS — J301 Allergic rhinitis due to pollen: Secondary | ICD-10-CM | POA: Diagnosis not present

## 2018-11-14 NOTE — Therapy (Signed)
[place] MAIN [facility] SERVICES [address] [place], [place], [address] Phone: [phone]   Fax:  [phone]  Physical Therapy Evaluation  The patient has been informed of current processes in place at Outpatient Rehab to protect patients from Covid-19 exposure including social distancing, schedule modifications, and new cleaning procedures. After discussing their particular risk with a therapist based on the patient's personal risk factors, the patient has decided to proceed with in-person therapy.   Patient Details  Name: Julia Deleon MRN: [id number] Date of Birth: 08/09/1945 Referring Provider (PT): Dr. Maryan Puls   Encounter Date: 11/14/2018  PT End of Session - 11/15/18 0813    Visit Number  1    Number of Visits  10    Date for PT Re-Evaluation  01/23/19    Authorization Type  Medicare    Authorization Time Period  through    PT Start Time  1638    PT Stop Time  1536    PT Time Calculation (min)  60 min    Activity Tolerance  Patient tolerated treatment well;No increased pain    Behavior During Therapy  WFL for tasks assessed/performed       Past Medical History:  Diagnosis Date  . Arthritis   . COPD (chronic obstructive pulmonary disease) ([place])   . Family history of adverse reaction to anesthesia    father was slow to wake up  . Fuch's endothelial dystrophy   . Hyperlipidemia   . Hypothyroidism   . PONV (postoperative nausea and vomiting)    slow to wake up and PONV    Past Surgical History:  Procedure Laterality Date  . APPENDECTOMY    . BACK SURGERY  09/26/2018   L4-5 PLIF by Dr. Arnoldo Morale  . CARDIAC CATHETERIZATION    . CHOLECYSTECTOMY    . EYE SURGERY    . FOOT SURGERY Right    pin removed left  . laser vein surgery    . NASAL SINUS SURGERY    . RIGHT/LEFT HEART CATH AND CORONARY ANGIOGRAPHY N/A 12/06/2017   Procedure: RIGHT/LEFT HEART CATH AND CORONARY ANGIOGRAPHY;  Surgeon: Yolonda Kida, MD;  Location:  [place] [facility];  Service: Cardiovascular;  Laterality: N/A;  . TONSILLECTOMY      There were no vitals filed for this visit.    Pelvic Floor Physical Therapy Evaluation and Assessment  SCREENING  Falls in last 6 mo: no  Patient's communication preference:   Red Flags:  Have you had any night sweats? no Unexplained weight loss? no Saddle anesthesia? no Unexplained changes in bowel or bladder habits? no  SUBJECTIVE  Patient reports: Thinks that SOB is due to allergies more than COPD. Had L4-5-S1 fusion due to spondylolisthesis. Was having pain at night that woke her, is now still having radiating pain but not at night, worse with long sitting. Was going to the gym regularly before her back "went out". Has been having a UTI almost constantly since she has had to catheterize    Precautions:  Vaginal atrophy, sciatica, osteoporosis   Social/Family/Vocational History:   retired  Recent Procedures/Tests/Findings:  Had L4-5-S1 fusion, x-ray taken to confirm improvement following.  Obstetrical History: 2 Vaginal deliveries, "thinks her tailbone was "cracked" along with everything else"  Gynecological History: Vaginal atrophy  Urinary History: Has had some retention since first delivery, much worse since surgery.   Gastrointestinal History: Able to have regular BM's without straining  Sexual activity/pain: Yes, attributed it to vaginal dryness, started  in her 64's gave up on intercourse ~ 2 years ago  Location of pain: low back and BLE Current pain:  2/10  Max pain:  5/10 Least pain:  0/10 Nature of pain: varies from achy to sharp  Patient Goals: Improve bladder function, decrease back pain and increase strength and balance.   OBJECTIVE  Posture/Observations:  Sitting: Posterior pelvic tilt Standing: R shoulder low, L foot ER, posterior pelvic tilt, L knee bent> R, weight on toes. L facing sacrum and pelvis R anterior/ L posterior  rotation.  Palpation/Segmental Motion/Joint Play:  Special tests:   Leg-length: (pt. Reports her leg-length is equal)  Scoliosis: L thoracic, R lumbar curve.   Range of Motion/Flexibilty:  Spine: B SB: WNL but pain/stretch on L side. L Rot: discomfort on L with rotation. ~ 1 foot from floor with forward bend, tightness, no pain. Hips:   Strength/MMT: Deferred  LE MMT  LE MMT Left Right  Hip flex:  (L2) /5 /5  Hip ext: /5 /5  Hip abd: /5 /5  Hip add: /5 /5  Hip IR /5 /5  Hip ER /5 /5     Abdominal:  Palpation: TTP to B Iliacus and Psoas Diastasis: not assessed  Pelvic Floor External Exam: Deferred until deemed necessary due to Covid-19 precautions. Introitus Appears:  Skin integrity:  Palpation: Cough: Prolapse visible?: Scar mobility:  Internal Vaginal Exam: Strength (PERF):  Symmetry: Palpation: Prolapse:   Internal Rectal Exam: Strength (PERF): Symmetry: Palpation: Prolapse:   Gait Analysis: L knee remains flexed through gait cycle, excessive posterior pelvic tilt, decreased hip EXT B.   Pelvic Floor Outcome Measures: not assessed  INTERVENTIONS THIS SESSION: Self-care: Educated on the structure and function of the pelvic floor in relation to their symptoms as well as the POC, and initial HEP in order to set patient expectations and understanding from which we will build on in the future sessions. Educated on the importance of learning diaphragmatic breathing to allow for future session success, given handout and video resources to practice.   Total time: 60 min.      [facility] PT Assessment - 11/15/18 0001      Assessment   Medical Diagnosis  Urinary Retention    Referring Provider (PT)  Dr. Maryan Puls      Balance Screen   Has the patient fallen in the past 6 months  No      [place] residence    Living Arrangements  Spouse/significant other      Prior Function   Level of Independence  Independent                 Objective measurements completed on examination: See above findings.                PT Short Term Goals - 11/15/18 0932      PT SHORT TERM GOAL #1   Title  Patient will demonstrate improved pelvic alignment and balance of musculature surrounding the pelvis to facilitate decreased PFM spasms and decrease pelvic pain.    Baseline  Pt. demonstrates severe posterior pelvic tilt, L thoracic/R lumbar scoliosis, and R anterior/L posterior pelvic obliquity.    Time  5    Period  Weeks    Status  New    Target Date  12/20/18      PT SHORT TERM GOAL #2   Title  Patient will demonstrate a coordinated contraction, relaxation, and bulge of the pelvic floor muscles  to demonstrate functional recruitment and motion and allow for bladder emptying.    Baseline  Pt. is unable to fully empty bladder and describes pain with catheterization, demonstrating high tone of PFM.    Time  5    Period  Weeks    Status  New    Target Date  12/20/18      PT SHORT TERM GOAL #3   Title  Patient will demonstrate improved sitting and standing posture to demonstrate learning and decrease stress on the pelvic floor with functional activity.    Baseline  Pt. demonstrates severe posterior pelvic tilt, L thoracic/R lumbar scoliosis, and R anterior/L posterior pelvic obliquity.    Time  5    Period  Weeks    Status  New    Target Date  12/20/18        PT Long Term Goals - 11/15/18 0852      PT LONG TERM GOAL #1   Title  Patient will describe pain no greater than 3/10 during Sitting for 2 hours to demonstrate improved functional ability.    Baseline  Max 5/10, worst with prolonged sitting.    Time  10    Period  Weeks    Status  New    Target Date  01/23/19      PT LONG TERM GOAL #2   Title  Patient will score less than or equal to 20% on the Female NIH-CPSI to demonstrate a reduction in pain, urinary symptoms, and an improved quality of life.    Baseline  To be given at 2nd  appt.    Time  10    Period  Weeks    Status  New    Target Date  01/23/19      PT LONG TERM GOAL #3   Title  Pt. Will be able to cath without pain and will be cleared by physiscian to d/c self-cath due to decreased residual volume with bladder emptying.    Baseline  required to cath 3 times per day, painful each time.    Time  10    Period  Weeks    Status  New    Target Date  01/24/19             Plan - 11/15/18 0817    Clinical Impression Statement  Pt. is a 73 y/o female who presents today with cheif c/o urinary retention and LBP that radiates to BLE. Her PMH is significant for Scoliosis, Osteoporosis, recent L4-5-S1 fusion due to spondylolisthesis, Shortness of Breath, and OA. Her clinical exam revealed significant Ranterior/L posterior rotation of innominates with L facing sacrum and pelvis and decreased spinal flexion ROM as well as spasms surrounding and within the pelvis. She will benefit from skilled pelvic PT to addressnoted defecits and to continue to assess for and address other potential causes of urinary retention and pain/weakness.    Personal Factors and Comorbidities  Age;Comorbidity 3+    Comorbidities  Osteoporosis, scoliosis, Arthritis, COPD, recent lumbosacral fusion for spondylolisthesis.    Examination-Activity Limitations  Toileting;Sit;Stand;Bend;Lift;Carry    Examination-Participation Restrictions  Interpersonal Relationship;Yard Work;Cleaning;Laundry    Stability/Clinical Decision Making  Unstable/Unpredictable    Clinical Decision Making  High    Rehab Potential  Good    PT Frequency  1x / week    PT Duration  Other (comment)   10 weeks   PT Treatment/Interventions  ADLs/Self Care Home Management;Aquatic Therapy;Moist Heat;Electrical Stimulation;Traction;Therapeutic activities;Functional mobility training;Stair training;Gait training;Therapeutic exercise;Balance training;Neuromuscular re-education;Patient/family education;Manual  techniques;Dry  needling;Passive range of motion;Scar mobilization;Taping    PT Next Visit Plan  Perform TP release to muscles surrounding pelvis/ TPDN? mobs to sacrum, MET correction etc.    PT Home Exercise Plan  diaphragmatic breathing    Consulted and Agree with Plan of Care  Patient       Patient will benefit from skilled therapeutic intervention in order to improve the following deficits and impairments:  Abnormal gait, Decreased balance, Increased muscle spasms, Decreased range of motion, Decreased scar mobility, Improper body mechanics, Decreased coordination, Decreased strength, Increased fascial restricitons, Impaired flexibility, Postural dysfunction, Pain  Visit Diagnosis: 1. Abnormal posture   2. Other muscle spasm        Problem List Patient Active Problem List   Diagnosis Date Noted  . Postoperative urinary retention 09/30/2018  . Spondylolisthesis, lumbar region 09/26/2018  . Aortic atherosclerosis ([place]) 08/31/2018  . CAD (coronary artery disease) 02/23/2018  . Fatigue 01/20/2018  . Short of breath on exertion 10/17/2017  . Advanced care planning/counseling discussion 03/28/2017  . Varicose veins of both lower extremities with complications 03/26/3566  . Arthritis 12/09/2016  . Vaginal atrophy 09/10/2015  . Menopausal hot flushes 09/10/2015  . Menopause 09/10/2015  . Onychomycosis due to dermatophyte 04/07/2015  . Bursitis of right shoulder 02/26/2015  . Environmental and seasonal allergies 02/26/2015  . Other allergic rhinitis 02/26/2015  . Dysuria 12/25/2014  . Sciatica 11/27/2014  . Osteoporosis 11/26/2014  . COPD (chronic obstructive pulmonary disease) ([place]) 11/26/2014  . Fuchs' corneal dystrophy 11/26/2014  . Essential (primary) hypertension 11/26/2014  . Hyperlipidemia 11/26/2014  . Chronic kidney disease, stage III (moderate) ([place]) 11/26/2014  . Hypothyroidism 11/26/2014  . Low back pain 11/26/2014  . Endothelial corneal dystrophy 11/26/2014   Willa Rough  DPT, ATC Willa Rough 11/15/2018, 8:57 AM  [place] MAIN [facility] SERVICES [address] [place], [place], [address] Phone: [phone]   Fax:  [phone]  Name: Julia Deleon MRN: [id number] Date of Birth: 11-Mar-1946

## 2018-11-14 NOTE — Patient Instructions (Signed)
Stabilization: Diaphragmatic Breathing    Lie with knees bent, feet flat. Place one hand on stomach, other on chest. Breathe deeply through nose, lifting belly hand without any motion of hand on chest.

## 2018-11-15 DIAGNOSIS — N952 Postmenopausal atrophic vaginitis: Secondary | ICD-10-CM | POA: Diagnosis not present

## 2018-11-15 DIAGNOSIS — R300 Dysuria: Secondary | ICD-10-CM | POA: Diagnosis not present

## 2018-11-15 DIAGNOSIS — N3021 Other chronic cystitis with hematuria: Secondary | ICD-10-CM | POA: Diagnosis not present

## 2018-11-15 DIAGNOSIS — R338 Other retention of urine: Secondary | ICD-10-CM | POA: Diagnosis not present

## 2018-11-17 DIAGNOSIS — R300 Dysuria: Secondary | ICD-10-CM | POA: Diagnosis not present

## 2018-11-17 DIAGNOSIS — J301 Allergic rhinitis due to pollen: Secondary | ICD-10-CM | POA: Diagnosis not present

## 2018-11-17 DIAGNOSIS — R338 Other retention of urine: Secondary | ICD-10-CM | POA: Diagnosis not present

## 2018-11-17 LAB — CYTOLOGY - PAP: Diagnosis: UNDETERMINED — AB

## 2018-11-20 ENCOUNTER — Encounter: Payer: Self-pay | Admitting: Obstetrics & Gynecology

## 2018-11-20 DIAGNOSIS — J301 Allergic rhinitis due to pollen: Secondary | ICD-10-CM | POA: Diagnosis not present

## 2018-11-20 NOTE — Progress Notes (Signed)
PAP- ASCUS No prior h/o abnormal PAP Plan repeat next year Letter sent  Barnett Applebaum, MD, [facility], [facility] 11/20/2018  7:36 AM

## 2018-11-21 ENCOUNTER — Other Ambulatory Visit: Payer: Self-pay

## 2018-11-21 ENCOUNTER — Ambulatory Visit: Payer: Medicare Other

## 2018-11-21 DIAGNOSIS — M62838 Other muscle spasm: Secondary | ICD-10-CM | POA: Diagnosis not present

## 2018-11-21 DIAGNOSIS — R293 Abnormal posture: Secondary | ICD-10-CM

## 2018-11-21 NOTE — Therapy (Signed)
[place] MAIN [facility] SERVICES [address] [place], [place], [address] Phone: [phone]   Fax:  [phone]  Physical Therapy Treatment  The patient has been informed of current processes in place at Outpatient Rehab to protect patients from Covid-19 exposure including social distancing, schedule modifications, and new cleaning procedures. After discussing their particular risk with a therapist based on the patient's personal risk factors, the patient has decided to proceed with in-person therapy.   Patient Details  Name: Julia Deleon MRN: [id number] Date of Birth: 03/29/1946 Referring Provider (PT): Dr. Maryan Puls   Encounter Date: 11/21/2018  PT End of Session - 11/22/18 1018    Visit Number  2    Number of Visits  10    Date for PT Re-Evaluation  01/23/19    Authorization Type  Medicare    Authorization Time Period  through    Authorization - Visit Number  2    Authorization - Number of Visits  10    PT Start Time  6294    PT Stop Time  1630    PT Time Calculation (min)  60 min    Activity Tolerance  Patient tolerated treatment well;No increased pain    Behavior During Therapy  WFL for tasks assessed/performed       Past Medical History:  Diagnosis Date  . Arthritis   . COPD (chronic obstructive pulmonary disease) ([place])   . Family history of adverse reaction to anesthesia    father was slow to wake up  . Fuch's endothelial dystrophy   . Hyperlipidemia   . Hypothyroidism   . PONV (postoperative nausea and vomiting)    slow to wake up and PONV    Past Surgical History:  Procedure Laterality Date  . APPENDECTOMY    . BACK SURGERY  09/26/2018   L4-5 PLIF by Dr. Arnoldo Morale  . CARDIAC CATHETERIZATION    . CHOLECYSTECTOMY    . EYE SURGERY    . FOOT SURGERY Right    pin removed left  . laser vein surgery    . NASAL SINUS SURGERY    . RIGHT/LEFT HEART CATH AND CORONARY ANGIOGRAPHY N/A 12/06/2017   Procedure: RIGHT/LEFT HEART  CATH AND CORONARY ANGIOGRAPHY;  Surgeon: Yolonda Kida, MD;  Location: [place] [facility];  Service: Cardiovascular;  Laterality: N/A;  . TONSILLECTOMY      There were no vitals filed for this visit.    Pelvic Floor Physical Therapy Treatment Note  SCREENING  Changes in medications, allergies, or medical history?: just re-started allergy shots.  SUBJECTIVE  Patient reports: She has not had any change, has been trying to do breathing but not sure if she is doing it right.  Precautions:  Vaginal atrophy, sciatica, osteoporosis   Pain update:  Average pain over prior week is 9/10, Pt. Has a hard time with pain scale, prefers not to dwell on the number.  Patient Goals: Improve bladder function, decrease back pain and increase strength and balance.   OBJECTIVE  Changes in: Posture/Observations:  Swayback posture/hyperkyphotic with posterior pelvic tilt.  Range of Motion/Flexibilty:  Unable to achieve fully upright posture due to tightness of hip-flexors pre-treatment. Able to attain post-treatment with less "tightness".   Minimal ROM with B SB, greater ROM with R>L (more restriction in L side)  Palpation: TTP to L QL, B Psoas and Iliacus.  Gait Analysis: Forward leaning, short strides, posterior pelvic tilt.  INTERVENTIONS THIS SESSION: Manual: Performed TP release and STM to  L QL, B Psoas and Iliacus to decrease spasm and pain and allow for improved balance of musculature for improved function and decreased symptoms. Dry-needle: Performed TPDN with a .30x30m needle as described below with standard approach to decrease spasm and pain and allow for improved balance of musculature for improved function and decreased symptoms. Therex: Educated on and preformed QL stretch and hip-flexor stretch at EOB To maintain and improve muscle length and allow for improved balance of musculature for long-term symptom relief. NM re-ed: educated on walking and standing with  improved posture (will need review) to help prevent recurrence of spasm and pain.  Total time: 60 min.                            PT Short Term Goals - 11/15/18 02947     PT SHORT TERM GOAL #1   Title  Patient will demonstrate improved pelvic alignment and balance of musculature surrounding the pelvis to facilitate decreased PFM spasms and decrease pelvic pain.    Baseline  Pt. demonstrates severe posterior pelvic tilt, L thoracic/R lumbar scoliosis, and R anterior/L posterior pelvic obliquity.    Time  5    Period  Weeks    Status  New    Target Date  12/20/18      PT SHORT TERM GOAL #2   Title  Patient will demonstrate a coordinated contraction, relaxation, and bulge of the pelvic floor muscles to demonstrate functional recruitment and motion and allow for bladder emptying.    Baseline  Pt. is unable to fully empty bladder and describes pain with catheterization, demonstrating high tone of PFM.    Time  5    Period  Weeks    Status  New    Target Date  12/20/18      PT SHORT TERM GOAL #3   Title  Patient will demonstrate improved sitting and standing posture to demonstrate learning and decrease stress on the pelvic floor with functional activity.    Baseline  Pt. demonstrates severe posterior pelvic tilt, L thoracic/R lumbar scoliosis, and R anterior/L posterior pelvic obliquity.    Time  5    Period  Weeks    Status  New    Target Date  12/20/18        PT Long Term Goals - 11/22/18 1012      PT LONG TERM GOAL #1   Title  Patient will describe pain no greater than 3/10 during Sitting for 2 hours to demonstrate improved functional ability.    Baseline  Max 5/10, worst with prolonged sitting.    Time  10    Period  Weeks    Status  On-going    Target Date  01/23/19      PT LONG TERM GOAL #2   Title  Patient will score less than or equal to 20% on the Female NIH-CPSI to demonstrate a reduction in pain, urinary symptoms, and an improved quality of  life.    Baseline  To be given at 2nd appt.    Time  10    Period  Weeks    Status  On-going    Target Date  01/23/19      PT LONG TERM GOAL #3   Title  Pt. Will be able to cath without pain and will be cleared by physiscian to d/c self-cath due to decreased residual volume with bladder emptying.    Baseline  required to  cath 3 times per day, painful each time.    Time  10    Period  Weeks    Status  On-going    Target Date  01/23/19      PT LONG TERM GOAL #4   Title  Patient will score less than or equal to 40% on the Female NIH-CPSI and 30% on the [facility] to demonstrate a reduction in pain, urinary symptoms, and an improved quality of life.    Baseline  Female NIH-CPSI: 38/43 (88%) , PDI: 45/75 (60%)    Time  10    Period  Weeks    Status  New    Target Date  01/23/19            Plan - 11/22/18 1018    Clinical Impression Statement  Pt.    Personal Factors and Comorbidities  Age;Comorbidity 3+    Comorbidities  Osteoporosis, scoliosis, Arthritis, COPD, recent lumbosacral fusion for spondylolisthesis.    Examination-Activity Limitations  Toileting;Sit;Stand;Bend;Lift;Carry    Examination-Participation Restrictions  Interpersonal Relationship;Yard Work;Cleaning;Laundry    Stability/Clinical Decision Making  Unstable/Unpredictable    Rehab Potential  Good    PT Frequency  1x / week    PT Duration  Other (comment)   10 weeks   PT Treatment/Interventions  ADLs/Self Care Home Management;Aquatic Therapy;Moist Heat;Electrical Stimulation;Traction;Therapeutic activities;Functional mobility training;Stair training;Gait training;Therapeutic exercise;Balance training;Neuromuscular re-education;Patient/family education;Manual techniques;Dry needling;Passive range of motion;Scar mobilization;Taping    PT Next Visit Plan  TPDN? to R Psoas, teach pelvic tilts, thoraCIC MOBILITY TOWEL? mobs to sacrum, MET correction etc.    PT Home Exercise Plan  diaphragmatic breathing, HIP-FLEXOR STRETCH,  SIDE-STRETCH.    Consulted and Agree with Plan of Care  Patient       Patient will benefit from skilled therapeutic intervention in order to improve the following deficits and impairments:  Abnormal gait, Decreased balance, Increased muscle spasms, Decreased range of motion, Decreased scar mobility, Improper body mechanics, Decreased coordination, Decreased strength, Increased fascial restricitons, Impaired flexibility, Postural dysfunction, Pain  Visit Diagnosis: 1. Abnormal posture   2. Other muscle spasm        Problem List Patient Active Problem List   Diagnosis Date Noted  . Postoperative urinary retention 09/30/2018  . Spondylolisthesis, lumbar region 09/26/2018  . Aortic atherosclerosis ([place]) 08/31/2018  . CAD (coronary artery disease) 02/23/2018  . Fatigue 01/20/2018  . Short of breath on exertion 10/17/2017  . Advanced care planning/counseling discussion 03/28/2017  . Varicose veins of both lower extremities with complications 15/17/6160  . Arthritis 12/09/2016  . Vaginal atrophy 09/10/2015  . Menopausal hot flushes 09/10/2015  . Menopause 09/10/2015  . Onychomycosis due to dermatophyte 04/07/2015  . Bursitis of right shoulder 02/26/2015  . Environmental and seasonal allergies 02/26/2015  . Other allergic rhinitis 02/26/2015  . Dysuria 12/25/2014  . Sciatica 11/27/2014  . Osteoporosis 11/26/2014  . COPD (chronic obstructive pulmonary disease) ([place]) 11/26/2014  . Fuchs' corneal dystrophy 11/26/2014  . Essential (primary) hypertension 11/26/2014  . Hyperlipidemia 11/26/2014  . Chronic kidney disease, stage III (moderate) ([place]) 11/26/2014  . Hypothyroidism 11/26/2014  . Low back pain 11/26/2014  . Endothelial corneal dystrophy 11/26/2014   Willa Rough DPT, ATC Willa Rough 11/22/2018, 10:31 AM  [place] MAIN [facility] SERVICES [address] [place], [place], [address] Phone: [phone]   Fax:  [phone]  Name:  Julia Deleon MRN: [id number] Date of Birth: 1945-08-17

## 2018-11-21 NOTE — Patient Instructions (Addendum)
  Your "usual posture" looks like the sway back picture above, we want it to look more like the first picture labeled "normal" Where the earlobe, tip of the shoulder, hip, and knee are all in a straight line.  Make sure that your knees are un-locked but not bent.  Draw up as if an imaginary string were tied to the crown of your head stretching you up tall. Gently pull shoulders back and down without "pushing" chest out. Pull chin in slightly as if you were a turtle pulling into your shell until ears line up with tips of shoulders.    Sit your bottom at the edge of the bed, pull your knee into your chest and keep it close as you lie back, feel the stretch down the front of the leg that is down. Hold for 5 deep breaths, repease 2-3 times for each side 1-2 times per day. You may use a towel to help hold the knee to your chest if needed.   You can do this on the ground, on a chair, or standing with a hand on a counter.  Hold for 30 seconds (5 deep breaths) and repeat 2-3 times on each side once a day

## 2018-11-23 DIAGNOSIS — J301 Allergic rhinitis due to pollen: Secondary | ICD-10-CM | POA: Diagnosis not present

## 2018-11-27 DIAGNOSIS — J301 Allergic rhinitis due to pollen: Secondary | ICD-10-CM | POA: Diagnosis not present

## 2018-11-28 ENCOUNTER — Encounter: Payer: Self-pay | Admitting: Obstetrics & Gynecology

## 2018-11-28 ENCOUNTER — Ambulatory Visit: Payer: Medicare Other

## 2018-11-28 ENCOUNTER — Other Ambulatory Visit: Payer: Self-pay

## 2018-11-28 ENCOUNTER — Ambulatory Visit
Admission: RE | Admit: 2018-11-28 | Discharge: 2018-11-28 | Disposition: A | Discharge status: Home / Self Care | Payer: Medicare Other | Source: Ambulatory Visit | Attending: Obstetrics & Gynecology | Admitting: Obstetrics & Gynecology

## 2018-11-28 DIAGNOSIS — R293 Abnormal posture: Secondary | ICD-10-CM | POA: Diagnosis not present

## 2018-11-28 DIAGNOSIS — Z1231 Encounter for screening mammogram for malignant neoplasm of breast: Secondary | ICD-10-CM | POA: Diagnosis not present

## 2018-11-28 DIAGNOSIS — M62838 Other muscle spasm: Secondary | ICD-10-CM

## 2018-11-28 NOTE — Patient Instructions (Signed)
   Do 2 sets of 15 tilts per day. Breathe in when you tilt forward (A) and out when you tuck under (B).  

## 2018-11-28 NOTE — Therapy (Signed)
Physical Therapy Treatment  The patient has been informed of current processes in place at [facility] to protect patients from Covid-19 exposure including social distancing, schedule modifications, and new cleaning procedures. After discussing their particular risk with a therapist based on the patient's personal risk factors, the patient has decided to proceed with in-person therapy.   Patient Details  Name: Julia Deleon MRN: [id number] Date of Birth: 1945/08/14 Referring Provider (PT): Dr. Maryan Puls   Encounter Date: 11/28/2018  PT End of Session - 11/28/18 1654    Visit Number  3    Number of Visits  10    Date for PT Re-Evaluation  01/23/19    Authorization Type  Medicare    Authorization Time Period  through    Authorization - Visit Number  3    Authorization - Number of Visits  10    PT Start Time  1607    PT Stop Time  1630    PT Time Calculation (min)  60 min    Activity Tolerance  Patient tolerated treatment well;No increased pain    Behavior During Therapy  WFL for tasks assessed/performed       Past Medical History:  Diagnosis Date  . Arthritis   . COPD (chronic obstructive pulmonary disease) ([place])   . Family history of adverse reaction to anesthesia    father was slow to wake up  . Fuch's endothelial dystrophy   . Hyperlipidemia   . Hypothyroidism   . PONV (postoperative nausea and vomiting)    slow to wake up and PONV    Past Surgical History:  Procedure Laterality Date  . APPENDECTOMY    . BACK SURGERY  09/26/2018   L4-5 PLIF by Dr. Arnoldo Morale  . CARDIAC CATHETERIZATION    . CHOLECYSTECTOMY    . EYE SURGERY    . FOOT SURGERY Right    pin removed left  . laser vein surgery    . NASAL SINUS SURGERY    . RIGHT/LEFT HEART CATH AND CORONARY ANGIOGRAPHY N/A 12/06/2017   Procedure: RIGHT/LEFT HEART CATH AND CORONARY ANGIOGRAPHY;  Surgeon: Yolonda Kida, MD;  Location: [facility];  Service: Cardiovascular;  Laterality: N/A;  .  TONSILLECTOMY      There were no vitals filed for this visit.    Pelvic Floor Physical Therapy Treatment Note  SCREENING  Changes in medications, allergies, or medical history?: none SUBJECTIVE  Patient reports: She felt better for a little bit but feels like she needs to come to PT more frequently because she lost what she had gained by the time she came back 1 week later.  Precautions:  Vaginal atrophy, sciatica, osteoporosis   Pain update: Average pain of 8/10  Patient Goals: Improve bladder function, decrease back pain and increase strength and balance.   OBJECTIVE  Changes in: Posture/Observations:  Swayback posture/hyperkyphotic with posterior pelvic tilt.  Palpation: TTP to B Glute Min. Iliacus, R lumbar multifidus ~ L2-3 and L lumbar paraspinals   Gait Analysis: Forward leaning, short strides, posterior pelvic tilt.  INTERVENTIONS THIS SESSION: Manual: Performed TP release and STM to  L lumbar paraspinals and B Glute min. to decrease spasm and pain and allow for improved balance of musculature for improved function and decreased symptoms. Dry-needle: Performed TPDN with a .30x9m needle as described below with standard approach to decrease spasm and pain and allow for improved balance of musculature for improved function and decreased symptoms. Therex: Educated on and practiced seated pelvic tiltsTo maintain  and improve muscle length and allow for improved balance of musculature for long-term symptom relief.   Total time: 60 min.                     Trigger Point Dry Needling - 11/28/18 0001    Consent Given?  Yes    Education Handout Provided  No    Muscles Treated Back/Hip  Gluteus minimus;Erector spinae;Lumbar multifidi    Erector spinae Response  Twitch response elicited;Palpable increased muscle length    Lumbar multifidi Response  Twitch response elicited;Palpable increased muscle length    Iliopsoas Response  Twitch response  elicited;Palpable increased muscle length             PT Short Term Goals - 11/15/18 0837      PT SHORT TERM GOAL #1   Title  Patient will demonstrate improved pelvic alignment and balance of musculature surrounding the pelvis to facilitate decreased PFM spasms and decrease pelvic pain.    Baseline  Pt. demonstrates severe posterior pelvic tilt, L thoracic/R lumbar scoliosis, and R anterior/L posterior pelvic obliquity.    Time  5    Period  Weeks    Status  New    Target Date  12/20/18      PT SHORT TERM GOAL #2   Title  Patient will demonstrate a coordinated contraction, relaxation, and bulge of the pelvic floor muscles to demonstrate functional recruitment and motion and allow for bladder emptying.    Baseline  Pt. is unable to fully empty bladder and describes pain with catheterization, demonstrating high tone of PFM.    Time  5    Period  Weeks    Status  New    Target Date  12/20/18      PT SHORT TERM GOAL #3   Title  Patient will demonstrate improved sitting and standing posture to demonstrate learning and decrease stress on the pelvic floor with functional activity.    Baseline  Pt. demonstrates severe posterior pelvic tilt, L thoracic/R lumbar scoliosis, and R anterior/L posterior pelvic obliquity.    Time  5    Period  Weeks    Status  New    Target Date  12/20/18        PT Long Term Goals - 11/22/18 1012      PT LONG TERM GOAL #1   Title  Patient will describe pain no greater than 3/10 during Sitting for 2 hours to demonstrate improved functional ability.    Baseline  Max 5/10, worst with prolonged sitting.    Time  10    Period  Weeks    Status  On-going    Target Date  01/23/19      PT LONG TERM GOAL #2   Title  Patient will score less than or equal to 20% on the Female NIH-CPSI to demonstrate a reduction in pain, urinary symptoms, and an improved quality of life.    Baseline  To be given at 2nd appt.    Time  10    Period  Weeks    Status  On-going     Target Date  01/23/19      PT LONG TERM GOAL #3   Title  Pt. Will be able to cath without pain and will be cleared by physiscian to d/c self-cath due to decreased residual volume with bladder emptying.    Baseline  required to cath 3 times per day, painful each time.    Time  10    Period  Weeks    Status  On-going    Target Date  01/23/19      PT LONG TERM GOAL #4   Title  Patient will score less than or equal to 40% on the Female NIH-CPSI and 30% on the [employer] to demonstrate a reduction in pain, urinary symptoms, and an improved quality of life.    Baseline  Female NIH-CPSI: 38/43 (88%) , PDI: 45/75 (60%)    Time  10    Period  Weeks    Status  New    Target Date  01/23/19            Plan - 11/28/18 1653    Clinical Impression Statement  Pt. Responded well to all interventions today, demonstrating decreased TTP and spasm through B low back and posterior hip as well as understanding and correct performance of all education and exercises provided today. Due to the complicated nature of her condition, Pt. Demonstrated return of hip-flexor spasms that had reduced at prior visit. She will benefit from an increased frequency of treatment to build upon improvements made in each session and not allow for regression to slow progress over time     Personal Factors and Comorbidities  Age;Comorbidity 3+    Comorbidities  Osteoporosis, scoliosis, Arthritis, COPD, recent lumbosacral fusion for spondylolisthesis.    Examination-Activity Limitations  Toileting;Sit;Stand;Bend;Lift;Carry    Examination-Participation Restrictions  Interpersonal Relationship;Yard Work;Cleaning;Laundry    Stability/Clinical Decision Making  Unstable/Unpredictable    Rehab Potential  Good    PT Frequency  2x / week    PT Duration  Other (comment)   10 weeks   PT Treatment/Interventions  ADLs/Self Care Home Management;Aquatic Therapy;Moist Heat;Electrical Stimulation;Traction;Therapeutic activities;Functional  mobility training;Stair training;Gait training;Therapeutic exercise;Balance training;Neuromuscular re-education;Patient/family education;Manual techniques;Dry needling;Passive range of motion;Scar mobilization;Taping    PT Next Visit Plan  TPDN to R Psoas and iliacus, review pelvic tilts, thoraCIC MOBILITY TOWEL? mobs to sacrum, MET correction etc.    PT Home Exercise Plan  diaphragmatic breathing, HIP-FLEXOR STRETCH, SIDE-STRETCH, pelvic tilts in seated.    Consulted and Agree with Plan of Care  Patient       Patient will benefit from skilled therapeutic intervention in order to improve the following deficits and impairments:  Abnormal gait, Decreased balance, Increased muscle spasms, Decreased range of motion, Decreased scar mobility, Improper body mechanics, Decreased coordination, Decreased strength, Increased fascial restricitons, Impaired flexibility, Postural dysfunction, Pain  Visit Diagnosis: 1. Abnormal posture   2. Other muscle spasm        Problem List Patient Active Problem List   Diagnosis Date Noted  . Postoperative urinary retention 09/30/2018  . Spondylolisthesis, lumbar region 09/26/2018  . Aortic atherosclerosis ([place]) 08/31/2018  . CAD (coronary artery disease) 02/23/2018  . Fatigue 01/20/2018  . Short of breath on exertion 10/17/2017  . Advanced care planning/counseling discussion 03/28/2017  . Varicose veins of both lower extremities with complications 50/53/9767  . Arthritis 12/09/2016  . Vaginal atrophy 09/10/2015  . Menopausal hot flushes 09/10/2015  . Menopause 09/10/2015  . Onychomycosis due to dermatophyte 04/07/2015  . Bursitis of right shoulder 02/26/2015  . Environmental and seasonal allergies 02/26/2015  . Other allergic rhinitis 02/26/2015  . Dysuria 12/25/2014  . Sciatica 11/27/2014  . Osteoporosis 11/26/2014  . COPD (chronic obstructive pulmonary disease) ([place]) 11/26/2014  . Fuchs' corneal dystrophy 11/26/2014  . Essential (primary)  hypertension 11/26/2014  . Hyperlipidemia 11/26/2014  . Chronic kidney disease, stage III (moderate) ([place]) 11/26/2014  . Hypothyroidism  11/26/2014  . Low back pain 11/26/2014  . Endothelial corneal dystrophy 11/26/2014   Willa Rough DPT, ATC Willa Rough 11/28/2018, 4:55 PM  [place] [facility] [address] [place], [place], [address] Phone: [phone]   Fax:  [phone]  Name: Adelina C Pontillo MRN: [id number] Date of Birth: 1945-12-10

## 2018-11-29 DIAGNOSIS — J439 Emphysema, unspecified: Secondary | ICD-10-CM | POA: Diagnosis not present

## 2018-11-29 DIAGNOSIS — R0600 Dyspnea, unspecified: Secondary | ICD-10-CM | POA: Diagnosis not present

## 2018-11-29 DIAGNOSIS — R600 Localized edema: Secondary | ICD-10-CM | POA: Diagnosis not present

## 2018-11-29 DIAGNOSIS — R5383 Other fatigue: Secondary | ICD-10-CM | POA: Diagnosis not present

## 2018-11-29 DIAGNOSIS — N183 Chronic kidney disease, stage 3 (moderate): Secondary | ICD-10-CM | POA: Diagnosis not present

## 2018-11-29 DIAGNOSIS — E039 Hypothyroidism, unspecified: Secondary | ICD-10-CM | POA: Diagnosis not present

## 2018-11-29 DIAGNOSIS — I208 Other forms of angina pectoris: Secondary | ICD-10-CM | POA: Diagnosis not present

## 2018-11-29 DIAGNOSIS — R0602 Shortness of breath: Secondary | ICD-10-CM | POA: Diagnosis not present

## 2018-11-29 DIAGNOSIS — E782 Mixed hyperlipidemia: Secondary | ICD-10-CM | POA: Diagnosis not present

## 2018-11-29 DIAGNOSIS — Z9889 Other specified postprocedural states: Secondary | ICD-10-CM | POA: Diagnosis not present

## 2018-11-29 DIAGNOSIS — I83893 Varicose veins of bilateral lower extremities with other complications: Secondary | ICD-10-CM | POA: Diagnosis not present

## 2018-11-29 DIAGNOSIS — I10 Essential (primary) hypertension: Secondary | ICD-10-CM | POA: Diagnosis not present

## 2018-11-30 DIAGNOSIS — J301 Allergic rhinitis due to pollen: Secondary | ICD-10-CM | POA: Diagnosis not present

## 2018-12-01 DIAGNOSIS — J301 Allergic rhinitis due to pollen: Secondary | ICD-10-CM | POA: Diagnosis not present

## 2018-12-04 DIAGNOSIS — N390 Urinary tract infection, site not specified: Secondary | ICD-10-CM | POA: Diagnosis not present

## 2018-12-04 DIAGNOSIS — J301 Allergic rhinitis due to pollen: Secondary | ICD-10-CM | POA: Diagnosis not present

## 2018-12-04 DIAGNOSIS — R309 Painful micturition, unspecified: Secondary | ICD-10-CM | POA: Diagnosis not present

## 2018-12-04 DIAGNOSIS — N952 Postmenopausal atrophic vaginitis: Secondary | ICD-10-CM | POA: Diagnosis not present

## 2018-12-04 DIAGNOSIS — R3914 Feeling of incomplete bladder emptying: Secondary | ICD-10-CM | POA: Diagnosis not present

## 2018-12-04 DIAGNOSIS — R300 Dysuria: Secondary | ICD-10-CM | POA: Diagnosis not present

## 2018-12-05 ENCOUNTER — Other Ambulatory Visit: Payer: Self-pay

## 2018-12-05 ENCOUNTER — Ambulatory Visit: Payer: Medicare Other

## 2018-12-05 DIAGNOSIS — R293 Abnormal posture: Secondary | ICD-10-CM

## 2018-12-05 DIAGNOSIS — M62838 Other muscle spasm: Secondary | ICD-10-CM | POA: Diagnosis not present

## 2018-12-05 NOTE — Patient Instructions (Signed)
Self External Trigger Point Relief    1) Wash your hands and prop yourself up in a way where you can easily reach the vagina. You may wish to have a small hand-held mirror near by.  2) Use the 2 middle fingers to put gentle pressure on the three external pelvic floor muscles and hold pressure and take deep breaths as you allow the tension to release and discomfort to dissipate   3) Repeat the process for any trigger points you find spending between 3-10 minutes on this every 1-2 days until you do not find any more trigger points or you are told otherwise by your therapist.   Self Posterior Fourchette Stretching/Mobilization    1) Wash your hands and prop your body up so you can easily reach the vagina, bring hand-held mirror if desired.  2) Apply lubricant to the thumb and vaginal opening  3) Place thumb ~ 1/2 an inch into the vagina with the pad of the thumb pointed down and apply gentle pressure to the posterior fourchette.  4) Gently sweep the thumb side to side and in/out while maintaining pressure down toward the anus. Make sure the pressure is not so great that your muscles tighten up and guard, just enough to create slight discomfort.  Do this for ~ 3 min. Per night to decrease tightness and tenderness at the vaginal opening.   Your Vagina is Not Cussing! One of the most fascinating things Ive learned as a pelvic floor physical therapist is that women really have a variety of ways that they wash their crotch. Should that be crotches? Can you make that plural? If not, why not? Tell me that. But, cleaning the crotch - its important. We clean our face, our armpits and our feet. The crotch has got a lot going on so it should be cleaned too, right? Women clean themselves differently, but thats not necessarily okay. There are some basic facts that are important to know when it comes to keeping your machine well-oiled and running, regardless of whether shes a [place] or a  2015 [employer]; cuz either way shes a beauty, right? So what is the right way for a woman to clean her vulvovaginal area in order to ensure cleanliness, odor reduction and avoidance of infection? Lets start with what I hear from patients: 1. I usually douche because thats what my mother did. 2. I use a lavender scented soap all over my body and I get a wash rag and scrub my vulva. 3. I spread my labias and get soap on them and then I put soap inside my vagina. Im very clean. 4. Im careful, so I go front to back with the soap. I start at the vulva and soap it real good, then I reach over to my anus and get that soapy. 5. I use a loofa on my vulva and then after I shower I spray a little perfume down there. You never know whats going to happen that day. Friends, Romans, Countrywomen - lend me your ear! All these people are WRONG! (and thats probably one reason why theyre seeing me in the first place) If you want my advice, Im going to be succinct, clear and direct. You can wash your vulvovaginal area any way youd like as long as you are in the shower, eliminate all soap and let warm water run over the area and only use your hands. Just call me the Lorene Dy of the vaginaor is that weird?  Heres what I want you to do: 1. Wash your hair. 2. Wash your body with soap. 3. Rinse everything off. 4. Let warm water rinse over your labias. Yes, you can spread your labias. 5. Let warm water rinse over your anus. 6. Get out of the shower.** 7. Gently and lovingly pat the vulva dry and put on white, cotton underwear. **You can wash your hands before getting out. So why am I so restricting? Heres why: 1. The vagina is self-cleansing. There is no need to douche or soap inside the vagina. Its already got a good bacteria called lactobacilli that has several important functions. Lactobacilli eats up bad bacteria that can cause infection, it keeps the vagina acidic in order to reduce  the likelihood of infections and its even postulated that lactobacilli can prompt the immune system. This helps reduce odor, infection and keeps the natural flora healthy. Oh, and get this - estrogen helps to feed lactobacilli. So if youre low on estrogen, it makes sense that you might be prone to more infections. Please, just dont use soap on the vulva or in the vagina. Trust me, your vagina is not cussing. (Ironically enough, the inside of the mouth is made up of the same durable tissue as the inside of the vagina.) 2. The vulva wasnt meant to be scrubbed - its not a potato. The vulva is sensitive like your fingertips, the skin around your eyes and your lips. Its meant to detect fine detail (for pleasure), so being forceful with it is going to make it more sensitive in a negative way - hypersensitive (for pain). Scrubbing can remove a fine layer of the vulvovaginal tissue which can create an anti-histamine response - much like scrubbing your arm would make your arm red. This creates an inflammatory cascade of events. Many people will heal from this quite quickly and may not notice any discomfort, but others may start to notice some irritation after some time. This is when you might start noticing sensitivity to things that never bothered you before like tight clothes, colorful underwear, lubricants or laundry detergent. 3. Scented items (or items with chemicals) like perfume (on the vulva), soap, bubble bath or even flavored or hot/cold/tingly/prickly/naughty sexual lubricant/condoms should be avoided as well because they could irritate the opening of the vagina (the vulvar vestibule) or the vagina itself. The vulvar vestibule is made of up different tissue than the vagina (but the same tissue as the urethra and bladder), so its possible that using chemical products here can cause pain and the symptoms of a urinary tract infection (UTI). 4. The vagina needs to breathe. Wearing tight, conforming clothing  all the time or daily pantyliners can be suffocating to your vulvovaginal area and irritating to the skin. Give it a break sometimes and wear looser clothing and or no underwear at all (like at night). 5. If you have a sensitive vulva or are prone to a lot of symptoms of infections, consider wearing white, cotton underwear instead of the fancy stuff. Over time, its possible to develop new allergies and unfortunately, some women develop allergies to synthetic materials and dyes in their underwear. This also means its best to wash your underwear with a detergent that is made for sensitive skin and is free of chemicals. ** Note - we will expand this area in the near future (with Saras blessings) to include other options for under wear or safe liners. Stay tuned!  And get this: Discharge doesnt mean you are dirty. Discharge  is natural and comes from a variety of places, most of which are not the vagina itself. What you see on your underwear is a mix of oil and gland secretions from the vulva and its also secretions from the uterus and the fallopian tubes. Discharge changes during different parts of the menstrual cycle because it serves different purposes. For example, when you are ovulating, the discharge is a different consistency so that sperm can pass through it more easily. Its all normal and healthy. However, if it starts to change colors or smell really funky - this indicates a possible issue with an area that is potentially apart from the vagina. Soaping and scrubbing to high Gillie Manners is not going to fix this - you really need to get checked out by a doctor in this situation. Taking care of the vulvovaginal tissue is easier than we want it to be. Less is more. So much more. Good, simple vulvovaginal hygiene means better flora (not fauna), reduced odor, less itching and less discomfort. So cheers to you and your polite vagina. That little number was raised right! -Bing Neighbors. Sauder PT, DPT

## 2018-12-05 NOTE — Therapy (Signed)
[place] MAIN [facility] SERVICES [address] [place], [place], [address] Phone: [phone]   Fax:  [phone]  Physical Therapy Treatment  The patient has been informed of current processes in place at Outpatient Rehab to protect patients from Covid-19 exposure including social distancing, schedule modifications, and new cleaning procedures. After discussing their particular risk with a therapist based on the patient's personal risk factors, the patient has decided to proceed with in-person therapy.   Patient Details  Name: Julia Deleon MRN: [id number] Date of Birth: [birth year] Referring Provider (PT): Dr. Maryan Puls   Encounter Date: 12/05/2018  PT End of Session - 12/06/18 1121    Visit Number  4    Number of Visits  23    Date for PT Re-Evaluation  02/09/19    Authorization Type  Medicare    Authorization Time Period  through    Authorization - Visit Number  1    Authorization - Number of Visits  20    PT Start Time  0867    PT Stop Time  6195    PT Time Calculation (min)  78 min    Activity Tolerance  Patient tolerated treatment well;No increased pain    Behavior During Therapy  WFL for tasks assessed/performed       Past Medical History:  Diagnosis Date  . Arthritis   . COPD (chronic obstructive pulmonary disease) ([place])   . Family history of adverse reaction to anesthesia    father was slow to wake up  . Fuch's endothelial dystrophy   . Hyperlipidemia   . Hypothyroidism   . PONV (postoperative nausea and vomiting)    slow to wake up and PONV    Past Surgical History:  Procedure Laterality Date  . APPENDECTOMY    . BACK SURGERY  09/26/2018   L4-5 PLIF by Dr. Arnoldo Morale  . CARDIAC CATHETERIZATION    . CHOLECYSTECTOMY    . EYE SURGERY    . FOOT SURGERY Right    pin removed left  . laser vein surgery    . NASAL SINUS SURGERY    . RIGHT/LEFT HEART CATH AND CORONARY ANGIOGRAPHY N/A 12/06/2017   Procedure: RIGHT/LEFT HEART  CATH AND CORONARY ANGIOGRAPHY;  Surgeon: Yolonda Kida, MD;  Location: [place] [facility];  Service: Cardiovascular;  Laterality: N/A;  . TONSILLECTOMY      There were no vitals filed for this visit.      Pelvic Floor Physical Therapy Treatment Note  SCREENING  Changes in medications, allergies, or medical history?:  On another antibiotic for UTI   SUBJECTIVE  Patient reports: Felt better for ~ 2 days and is back "where she was" now  Precautions:  Vaginal atrophy, sciatica, osteoporosis  Pain update:  Location of pain: back Current pain:  8/10  Max pain:  8/10 Least pain:  5/10   Patient Goals: Improve bladder function, decrease back pain and increase strength and balance.   OBJECTIVE  Changes in: Posture/Observations:  Decreased but present forward lean, hyperkyphosis, and posterior pelvic tilt  Pelvic Floor External Exam: Introitus Appears: gaping Skin integrity: erythema, dry Palpation: TTP through B bulbo Cough: parodoxiacal Prolapse visible?: cervix at ~ 1/2 cm above introitus. Scar mobility: not assessed  Internal Vaginal Exam: Strength (PERF): 2/5, 3 sec, 1 time Symmetry: TTP through R>L except for coccygeus which is worse on L Palpation: TTP to all PFM internally  Prolapse: cervix at ~ 1/2 cm above introitus.  Abdominal:  Decreased TA  activation.   Palpation: TTP to B adductors  INTERVENTIONS THIS SESSION: Manual: Assessed PFM and  performed TP release to L OI and Coccygeus, externally to Bulbo on R to decrease spasm and pain and allow for improved balance of musculature for improved function and decreased symptoms.. Self-care: Educated on Hygeine to decrease PH imbalance and redness for decreased vulvar irritation, vaginal suppositories to improve vaginal moisture and tissue health, self posterior fourchette massag and, external TP release to decrease spasm and pain and allow for improved balance of musculature for improved function  and decreased symptoms and using coconut oil/Vit. E oil externally to improve vulvar tissue health and decrease irritation when using self-catheter.  Total time: 78 min.                          PT Short Term Goals - 11/15/18 1884      PT SHORT TERM GOAL #1   Title  Patient will demonstrate improved pelvic alignment and balance of musculature surrounding the pelvis to facilitate decreased PFM spasms and decrease pelvic pain.    Baseline  Pt. demonstrates severe posterior pelvic tilt, L thoracic/R lumbar scoliosis, and R anterior/L posterior pelvic obliquity.    Time  5    Period  Weeks    Status  New    Target Date  12/20/18      PT SHORT TERM GOAL #2   Title  Patient will demonstrate a coordinated contraction, relaxation, and bulge of the pelvic floor muscles to demonstrate functional recruitment and motion and allow for bladder emptying.    Baseline  Pt. is unable to fully empty bladder and describes pain with catheterization, demonstrating high tone of PFM.    Time  5    Period  Weeks    Status  New    Target Date  12/20/18      PT SHORT TERM GOAL #3   Title  Patient will demonstrate improved sitting and standing posture to demonstrate learning and decrease stress on the pelvic floor with functional activity.    Baseline  Pt. demonstrates severe posterior pelvic tilt, L thoracic/R lumbar scoliosis, and R anterior/L posterior pelvic obliquity.    Time  5    Period  Weeks    Status  New    Target Date  12/20/18        PT Long Term Goals - 11/28/18 1709      PT LONG TERM GOAL #1   Title  Patient will describe pain no greater than 3/10 during Sitting for 2 hours to demonstrate improved functional ability.    Baseline  Max 5/10, worst with prolonged sitting.    Time  10    Period  Weeks    Status  On-going    Target Date  02/09/19      PT LONG TERM GOAL #2   Title  Patient will score less than or equal to 20% on the Female NIH-CPSI to demonstrate a  reduction in pain, urinary symptoms, and an improved quality of life.    Baseline  To be given at 2nd appt.    Time  10    Period  Weeks    Status  On-going    Target Date  02/09/19      PT LONG TERM GOAL #3   Title  Pt. Will be able to cath without pain and will be cleared by physiscian to d/c self-cath due to decreased residual volume with bladder  emptying.    Baseline  required to cath 3 times per day, painful each time.    Time  10    Period  Weeks    Status  On-going    Target Date  02/09/19      PT LONG TERM GOAL #4   Title  Patient will score less than or equal to 40% on the Female NIH-CPSI and 30% on the [facility] to demonstrate a reduction in pain, urinary symptoms, and an improved quality of life.    Baseline  Female NIH-CPSI: 38/43 (88%) , PDI: 45/75 (60%)    Time  10    Period  Weeks    Status  New    Target Date  02/09/19            Plan - 12/06/18 1123    Clinical Impression Statement  Pt. Responded well to all interventions today, though she states that the improvement from last visit only lasted ~ 2-3 days. Following treatment she is demonstrating some decreased PFM spasms as well as understanding and correct performance of all education and exercises provided today. She will continue to benefit from skilled physical therapy to work toward remaining goals and maximize function as well as decrease likelihood of symptom increase or recurrence.     Personal Factors and Comorbidities  Age;Comorbidity 3+    Comorbidities  Osteoporosis, scoliosis, Arthritis, COPD, recent lumbosacral fusion for spondylolisthesis.    Examination-Activity Limitations  Toileting;Sit;Stand;Bend;Lift;Carry    Examination-Participation Restrictions  Interpersonal Relationship;Yard Work;Cleaning;Laundry    Stability/Clinical Decision Making  Unstable/Unpredictable    Rehab Potential  Good    PT Frequency  2x / week    PT Duration  Other (comment)   10 weeks   PT Treatment/Interventions   ADLs/Self Care Home Management;Aquatic Therapy;Moist Heat;Electrical Stimulation;Traction;Therapeutic activities;Functional mobility training;Stair training;Gait training;Therapeutic exercise;Balance training;Neuromuscular re-education;Patient/family education;Manual techniques;Dry needling;Passive range of motion;Scar mobilization;Taping    PT Next Visit Plan  TPDN? to R Psoas and iliacus, review pelvic tilts, thoraCIC MOBILITY TOWEL? mobs to sacrum, MET correction etc.    PT Home Exercise Plan  diaphragmatic breathing, HIP-FLEXOR STRETCH, SIDE-STRETCH, pelvic tilts in seated.    Consulted and Agree with Plan of Care  Patient       Patient will benefit from skilled therapeutic intervention in order to improve the following deficits and impairments:  Abnormal gait, Decreased balance, Increased muscle spasms, Decreased range of motion, Decreased scar mobility, Improper body mechanics, Decreased coordination, Decreased strength, Increased fascial restricitons, Impaired flexibility, Postural dysfunction, Pain  Visit Diagnosis: 1. Abnormal posture   2. Other muscle spasm        Problem List Patient Active Problem List   Diagnosis Date Noted  . Chronic UTI 12/06/2018  . Status post lumbar surgery 12/06/2018  . Spondylolisthesis, lumbar region 09/26/2018  . Aortic atherosclerosis ([place]) 08/31/2018  . CAD (coronary artery disease) 02/23/2018  . Fatigue 01/20/2018  . Short of breath on exertion 10/17/2017  . Advanced care planning/counseling discussion 03/28/2017  . Varicose veins of both lower extremities with complications 14/43/1540  . Arthritis 12/09/2016  . Vaginal atrophy 09/10/2015  . Menopausal hot flushes 09/10/2015  . Menopause 09/10/2015  . Onychomycosis due to dermatophyte 04/07/2015  . Bursitis of right shoulder 02/26/2015  . Environmental and seasonal allergies 02/26/2015  . Other allergic rhinitis 02/26/2015  . Osteoporosis 11/26/2014  . COPD (chronic obstructive pulmonary  disease) ([place]) 11/26/2014  . Fuchs' corneal dystrophy 11/26/2014  . Essential (primary) hypertension 11/26/2014  . Hyperlipidemia 11/26/2014  . Chronic  kidney disease, stage III (moderate) ([place]) 11/26/2014  . Hypothyroidism 11/26/2014  . Low back pain 11/26/2014  . Endothelial corneal dystrophy 11/26/2014   Willa Rough DPT, ATC Willa Rough 12/06/2018, 11:25 AM  [place] MAIN [facility] SERVICES [address] [place], [place], [address] Phone: [phone]   Fax:  [phone]  Name: Julia Deleon MRN: [id number] Date of Birth: 12-30-1945

## 2018-12-06 ENCOUNTER — Other Ambulatory Visit: Payer: Self-pay

## 2018-12-06 ENCOUNTER — Ambulatory Visit (INDEPENDENT_AMBULATORY_CARE_PROVIDER_SITE_OTHER): Payer: Medicare Other | Admitting: Family Medicine

## 2018-12-06 ENCOUNTER — Encounter: Payer: Self-pay | Admitting: Family Medicine

## 2018-12-06 DIAGNOSIS — I2583 Coronary atherosclerosis due to lipid rich plaque: Secondary | ICD-10-CM | POA: Diagnosis not present

## 2018-12-06 DIAGNOSIS — Z9889 Other specified postprocedural states: Secondary | ICD-10-CM | POA: Insufficient documentation

## 2018-12-06 DIAGNOSIS — I2510 Atherosclerotic heart disease of native coronary artery without angina pectoris: Secondary | ICD-10-CM

## 2018-12-06 DIAGNOSIS — N390 Urinary tract infection, site not specified: Secondary | ICD-10-CM

## 2018-12-06 NOTE — Assessment & Plan Note (Signed)
Discussed care and tx by urology. Encouraged to use methenamine as rx by urology. Discussed use.

## 2018-12-06 NOTE — Progress Notes (Signed)
   LMP  (LMP Unknown)    Subjective:    Patient ID: Julia Deleon, female    DOB: Feb 17, 1946, 73 y.o.   MRN: [id number]  HPI: Julia Deleon is a 73 y.o. female  Med check Discussed with patient all in all doing well except for chronic UTIs urology has prescribed methenamine which was discussed in detail with patient and encouraged her to start taking this medication.  She has been instructed to take vitamin C along with this medication after her Cipro is finished. Back pain from recovery from surgery is doing okay is taking Parafon at nighttime which seems to help with self catheterizations. Is going to physical therapy for urinary bladder and back pain strengthening exercises which is seeming to help this started twice a week. Patient with multiple other medical problems which were touched on and reviewed with stable blood pressure heart disease kidney disease.  Relevant past medical, surgical, family and social history reviewed and updated as indicated. Interim medical history since our last visit reviewed. Allergies and medications reviewed and updated.  Review of Systems  Constitutional: Negative.   Respiratory: Negative.   Cardiovascular: Negative.     Per HPI unless specifically indicated above     Objective:    LMP  (LMP Unknown)   Wt Readings from Last 3 Encounters:  11/13/18 160 lb (72.6 kg)  09/30/18 159 lb 6.3 oz (72.3 kg)  09/26/18 159 lb 4 oz (72.2 kg)    Physical Exam  Results for orders placed or performed in visit on 11/13/18  Cytology - PAP  Result Value Ref Range   Adequacy (A)     Satisfactory for evaluation  endocervical/transformation zone component PRESENT.   Diagnosis (A)     ATYPICAL SQUAMOUS CELLS OF UNDETERMINED SIGNIFICANCE (ASC-US).   Material Submitted CervicoVaginal Pap [ThinPrep Imaged] (A)       Assessment & Plan:   Problem List Items Addressed This Visit      Genitourinary   Chronic UTI    Discussed care and tx by urology. Encouraged to  use methenamine as rx by urology. Discussed use.         Other   Status post lumbar surgery    Doing better with PT        Telemedicine using audio/video telecommunications for a synchronous communication visit. Today's visit due to COVID-19 isolation precautions I connected with and verified that I am speaking with the correct person using two identifiers.   I discussed the limitations, risks, security and privacy concerns of performing an evaluation and management service by telecommunication and the availability of in person appointments. I also discussed with the patient that there may be a patient responsible charge related to this service. The patient expressed understanding and agreed to proceed. The patient's location is home. I am at home.   I discussed the assessment and treatment plan with the patient. The patient was provided an opportunity to ask questions and all were answered. The patient agreed with the plan and demonstrated an understanding of the instructions.   The patient was advised to call back or seek an in-person evaluation if the symptoms worsen or if the condition fails to improve as anticipated.   I provided 21+ minutes of time during this encounter.  Follow up plan: Return in about 4 weeks (around 01/03/2019).

## 2018-12-06 NOTE — Assessment & Plan Note (Signed)
Doing better with PT

## 2018-12-07 DIAGNOSIS — J301 Allergic rhinitis due to pollen: Secondary | ICD-10-CM | POA: Diagnosis not present

## 2018-12-08 ENCOUNTER — Other Ambulatory Visit: Payer: Self-pay

## 2018-12-08 ENCOUNTER — Ambulatory Visit: Payer: Medicare Other

## 2018-12-08 DIAGNOSIS — R293 Abnormal posture: Secondary | ICD-10-CM

## 2018-12-08 DIAGNOSIS — M62838 Other muscle spasm: Secondary | ICD-10-CM | POA: Diagnosis not present

## 2018-12-08 NOTE — Therapy (Signed)
[place] MAIN [facility] SERVICES [address] [place], [place], [address] Phone: [phone]   Fax:  [phone]  Physical Therapy Treatment  The patient has been informed of current processes in place at Outpatient Rehab to protect patients from Covid-19 exposure including social distancing, schedule modifications, and new cleaning procedures. After discussing their particular risk with a therapist based on the patient's personal risk factors, the patient has decided to proceed with in-person therapy.   Patient Details  Name: Julia Deleon MRN: [id number] Date of Birth: 1945/11/09 Referring Provider (PT): Dr. Maryan Puls   Encounter Date: 12/08/2018  PT End of Session - 12/08/18 1133    Visit Number  5    Number of Visits  23    Date for PT Re-Evaluation  02/09/19    Authorization Type  Medicare    Authorization Time Period  through    Authorization - Visit Number  2    Authorization - Number of Visits  20    PT Start Time  0900    PT Stop Time  1015    PT Time Calculation (min)  75 min    Activity Tolerance  Patient tolerated treatment well;No increased pain    Behavior During Therapy  WFL for tasks assessed/performed       Past Medical History:  Diagnosis Date  . Arthritis   . COPD (chronic obstructive pulmonary disease) ([place])   . Family history of adverse reaction to anesthesia    father was slow to wake up  . Fuch's endothelial dystrophy   . Hyperlipidemia   . Hypothyroidism   . PONV (postoperative nausea and vomiting)    slow to wake up and PONV    Past Surgical History:  Procedure Laterality Date  . APPENDECTOMY    . BACK SURGERY  09/26/2018   L4-5 PLIF by Dr. Arnoldo Morale  . CARDIAC CATHETERIZATION    . CHOLECYSTECTOMY    . EYE SURGERY    . FOOT SURGERY Right    pin removed left  . laser vein surgery    . NASAL SINUS SURGERY    . RIGHT/LEFT HEART CATH AND CORONARY ANGIOGRAPHY N/A 12/06/2017   Procedure: RIGHT/LEFT HEART  CATH AND CORONARY ANGIOGRAPHY;  Surgeon: Yolonda Kida, MD;  Location: [place] [facility];  Service: Cardiovascular;  Laterality: N/A;  . TONSILLECTOMY      There were no vitals filed for this visit.      Pelvic Floor Physical Therapy Treatment Note  SCREENING  Changes in medications, allergies, or medical history?:  none  SUBJECTIVE  Patient reports: Had significant back pain yesterday, must have slept funny, is feeling better today.  Precautions:  Vaginal atrophy, sciatica, osteoporosis  Pain update:  Location of pain: back Current pain:  6/10  Max pain:  9/10 Least pain:  5/10   Patient Goals: Improve bladder function, decrease back pain and increase strength and balance.   OBJECTIVE  Changes in: Posture/Observations:  Decreased but present forward lean, hyperkyphosis, and posterior pelvic tilt  Abdominal:  Decreased TA activation/coordination. Best able to achieve TA in quad/mod-quad position and with laugh. Used cues for pursed lips or loud exhale with little success. Pt. paradoxical with laugh/cough.   Palpation: TTP to L Iliacus, Psoas, Glute Min. And Lumbar multifidus.  INTERVENTIONS THIS SESSION: Manual: Performed TP release to L Iliacus, Psoas, Glute Min. And Lumbar multifidus to decrease spasm and pain and allow for improved balance of musculature for improved function and decreased symptoms.Marland Kitchen  Therex: Educated on and practiced diaphragmatic breathing and TA in mod-quad to improve coordination and strength of the deep-core musculature. Pt. Requires maximal cues and repetition.   Total time: 78 min.                Trigger Point Dry Needling - 12/08/18 0001    Consent Given?  Yes    Education Handout Provided  No    Muscles Treated Back/Hip  Gluteus minimus;Iliacus;Lumbar multifidi;Iliopsoas    Gluteus Minimus Response  Twitch response elicited;Palpable increased muscle length    Iliacus Response  Twitch response elicited     Lumbar multifidi Response  Twitch response elicited;Palpable increased muscle length    Iliopsoas Response  Twitch response elicited;Palpable increased muscle length             PT Short Term Goals - 11/15/18 6203      PT SHORT TERM GOAL #1   Title  Patient will demonstrate improved pelvic alignment and balance of musculature surrounding the pelvis to facilitate decreased PFM spasms and decrease pelvic pain.    Baseline  Pt. demonstrates severe posterior pelvic tilt, L thoracic/R lumbar scoliosis, and R anterior/L posterior pelvic obliquity.    Time  5    Period  Weeks    Status  New    Target Date  12/20/18      PT SHORT TERM GOAL #2   Title  Patient will demonstrate a coordinated contraction, relaxation, and bulge of the pelvic floor muscles to demonstrate functional recruitment and motion and allow for bladder emptying.    Baseline  Pt. is unable to fully empty bladder and describes pain with catheterization, demonstrating high tone of PFM.    Time  5    Period  Weeks    Status  New    Target Date  12/20/18      PT SHORT TERM GOAL #3   Title  Patient will demonstrate improved sitting and standing posture to demonstrate learning and decrease stress on the pelvic floor with functional activity.    Baseline  Pt. demonstrates severe posterior pelvic tilt, L thoracic/R lumbar scoliosis, and R anterior/L posterior pelvic obliquity.    Time  5    Period  Weeks    Status  New    Target Date  12/20/18        PT Long Term Goals - 11/28/18 1709      PT LONG TERM GOAL #1   Title  Patient will describe pain no greater than 3/10 during Sitting for 2 hours to demonstrate improved functional ability.    Baseline  Max 5/10, worst with prolonged sitting.    Time  10    Period  Weeks    Status  On-going    Target Date  02/09/19      PT LONG TERM GOAL #2   Title  Patient will score less than or equal to 20% on the Female NIH-CPSI to demonstrate a reduction in pain, urinary symptoms,  and an improved quality of life.    Baseline  To be given at 2nd appt.    Time  10    Period  Weeks    Status  On-going    Target Date  02/09/19      PT LONG TERM GOAL #3   Title  Pt. Will be able to cath without pain and will be cleared by physiscian to d/c self-cath due to decreased residual volume with bladder emptying.    Baseline  required  to cath 3 times per day, painful each time.    Time  10    Period  Weeks    Status  On-going    Target Date  02/09/19      PT LONG TERM GOAL #4   Title  Patient will score less than or equal to 40% on the Female NIH-CPSI and 30% on the [facility] to demonstrate a reduction in pain, urinary symptoms, and an improved quality of life.    Baseline  Female NIH-CPSI: 38/43 (88%) , PDI: 45/75 (60%)    Time  10    Period  Weeks    Status  New    Target Date  02/09/19            Plan - 12/08/18 1134    Clinical Impression Statement  Pt. Responded well to all interventions today, demonstrating improved ability to recruit and coordinate TA with breathing, decreased spasms, as well as understanding and correct performance of all education and exercises provided today. They will continue to benefit from skilled physical therapy to work toward remaining goals and maximize function as well as decrease likelihood of symptom increase or recurrence.     Personal Factors and Comorbidities  Age;Comorbidity 3+    Comorbidities  Osteoporosis, scoliosis, Arthritis, COPD, recent lumbosacral fusion for spondylolisthesis.    Examination-Activity Limitations  Toileting;Sit;Stand;Bend;Lift;Carry    Examination-Participation Restrictions  Interpersonal Relationship;Yard Work;Cleaning;Laundry    Stability/Clinical Decision Making  Unstable/Unpredictable    Rehab Potential  Good    PT Frequency  2x / week    PT Duration  Other (comment)   10 weeks   PT Treatment/Interventions  ADLs/Self Care Home Management;Aquatic Therapy;Moist Heat;Electrical  Stimulation;Traction;Therapeutic activities;Functional mobility training;Stair training;Gait training;Therapeutic exercise;Balance training;Neuromuscular re-education;Patient/family education;Manual techniques;Dry needling;Passive range of motion;Scar mobilization;Taping    PT Next Visit Plan  TPDN? to R Psoas and iliacus, review pelvic tilts, thoracic mobility/towel? mobs to sacrum, MET correction etc.    PT Home Exercise Plan  diaphragmatic breathing, hip-flexor stretch, side-stretch, pelvic tilts in seated, TA in mod-quad, diaphragmatic breathing.    Consulted and Agree with Plan of Care  Patient       Patient will benefit from skilled therapeutic intervention in order to improve the following deficits and impairments:  Abnormal gait, Decreased balance, Increased muscle spasms, Decreased range of motion, Decreased scar mobility, Improper body mechanics, Decreased coordination, Decreased strength, Increased fascial restricitons, Impaired flexibility, Postural dysfunction, Pain  Visit Diagnosis: 1. Abnormal posture   2. Other muscle spasm        Problem List Patient Active Problem List   Diagnosis Date Noted  . Chronic UTI 12/06/2018  . Status post lumbar surgery 12/06/2018  . Spondylolisthesis, lumbar region 09/26/2018  . Aortic atherosclerosis ([place]) 08/31/2018  . CAD (coronary artery disease) 02/23/2018  . Fatigue 01/20/2018  . Short of breath on exertion 10/17/2017  . Advanced care planning/counseling discussion 03/28/2017  . Varicose veins of both lower extremities with complications 44/05/270  . Arthritis 12/09/2016  . Vaginal atrophy 09/10/2015  . Menopausal hot flushes 09/10/2015  . Menopause 09/10/2015  . Onychomycosis due to dermatophyte 04/07/2015  . Bursitis of right shoulder 02/26/2015  . Environmental and seasonal allergies 02/26/2015  . Other allergic rhinitis 02/26/2015  . Osteoporosis 11/26/2014  . COPD (chronic obstructive pulmonary disease) ([place]) 11/26/2014   . Fuchs' corneal dystrophy 11/26/2014  . Essential (primary) hypertension 11/26/2014  . Hyperlipidemia 11/26/2014  . Chronic kidney disease, stage III (moderate) ([place]) 11/26/2014  . Hypothyroidism 11/26/2014  . Low  back pain 11/26/2014  . Endothelial corneal dystrophy 11/26/2014   Willa Rough DPT, ATC Willa Rough 12/08/2018, 11:41 AM  [place] MAIN [facility] SERVICES [address] [place], [place], [address] Phone: [phone]   Fax:  [phone]  Name: Julia Deleon MRN: [id number] Date of Birth: 05/09/1946

## 2018-12-08 NOTE — Patient Instructions (Signed)
Stabilization: Diaphragmatic Breathing    Lie with knees bent, feet flat. Place one hand on stomach, other on chest. Breathe deeply through nose, lifting belly hand without any motion of hand on chest. Place a heavy book on your abdomen to practice.   Do this for at least 5 min at night before going to sleep and as much as you can remember throughout the day in sitting or standing.     Breathe in, let belly relax down toward the floor and then breathe out HARD, pulling the lower belly in toward the backbone.   Repeat this _15x2__ times _1-3__ times per day

## 2018-12-11 DIAGNOSIS — J301 Allergic rhinitis due to pollen: Secondary | ICD-10-CM | POA: Diagnosis not present

## 2018-12-12 ENCOUNTER — Ambulatory Visit: Payer: Medicare Other | Attending: Urology

## 2018-12-12 ENCOUNTER — Other Ambulatory Visit: Payer: Self-pay

## 2018-12-12 DIAGNOSIS — M62838 Other muscle spasm: Secondary | ICD-10-CM | POA: Insufficient documentation

## 2018-12-12 DIAGNOSIS — R293 Abnormal posture: Secondary | ICD-10-CM | POA: Diagnosis not present

## 2018-12-12 NOTE — Therapy (Signed)
[place] MAIN [facility] SERVICES [address] [place], [place], [address] Phone: [phone]   Fax:  [phone]  Physical Therapy Treatment  The patient has been informed of current processes in place at Outpatient Rehab to protect patients from Covid-19 exposure including social distancing, schedule modifications, and new cleaning procedures. After discussing their particular risk with a therapist based on the patient's personal risk factors, the patient has decided to proceed with in-person therapy.   Patient Details  Name: Julia Deleon MRN: [id number] Date of Birth: 1945-05-12 Referring Provider (PT): Dr. Maryan Puls   Encounter Date: 12/12/2018  PT End of Session - 12/12/18 1700    Visit Number  6    Number of Visits  23    Date for PT Re-Evaluation  02/09/19    Authorization Type  Medicare    Authorization Time Period  through    Authorization - Visit Number  3    Authorization - Number of Visits  20    PT Start Time  8828    PT Stop Time  0034    PT Time Calculation (min)  60 min    Activity Tolerance  Patient tolerated treatment well;No increased pain    Behavior During Therapy  WFL for tasks assessed/performed       Past Medical History:  Diagnosis Date  . Arthritis   . COPD (chronic obstructive pulmonary disease) ([place])   . Family history of adverse reaction to anesthesia    father was slow to wake up  . Fuch's endothelial dystrophy   . Hyperlipidemia   . Hypothyroidism   . PONV (postoperative nausea and vomiting)    slow to wake up and PONV    Past Surgical History:  Procedure Laterality Date  . APPENDECTOMY    . BACK SURGERY  09/26/2018   L4-5 PLIF by Dr. Arnoldo Morale  . CARDIAC CATHETERIZATION    . CHOLECYSTECTOMY    . EYE SURGERY    . FOOT SURGERY Right    pin removed left  . laser vein surgery    . NASAL SINUS SURGERY    . RIGHT/LEFT HEART CATH AND CORONARY ANGIOGRAPHY N/A 12/06/2017   Procedure: RIGHT/LEFT HEART  CATH AND CORONARY ANGIOGRAPHY;  Surgeon: Yolonda Kida, MD;  Location: [facility] [facility];  Service: Cardiovascular;  Laterality: N/A;  . TONSILLECTOMY      There were no vitals filed for this visit.     Pelvic Floor Physical Therapy Treatment Note  SCREENING  Changes in medications, allergies, or medical history?:  none  SUBJECTIVE  Patient reports: Had a little bit more energy yesterday but is feeling more painful again today. Has been having low energy for ~ 1.5 years now and cannot figure out why she does not have energy. Is feeling stressed about the health issues she has.   Has been able to empty a little bit at lunchtime before catheterizing which she could not do before.  Precautions:  Vaginal atrophy, sciatica, osteoporosis  Pain update:  Location of pain: back Current pain:  8/10  Max pain:  9/10 Least pain:  5/10  *following treatment pain was 3/10   Patient Goals: Improve bladder function, decrease back pain and increase strength and balance.   OBJECTIVE  Changes in: Posture/Observations:  Decreased but present forward lean, hyperkyphosis, and posterior pelvic tilt  Pelvic Floor:  No TTP to R bulbo, mild to B STP and L IC, gone following treatment.  Internally TTP through all muscles  on R. Attained decreased spasms through all major areas with no TTP to R anterior PR following treatment.   Palpation: TTP to L Glute Min. and OI  INTERVENTIONS THIS SESSION: Manual: Performed TP release to all muscles internally on the R and through STP B and L IC externally of the PFM and to the L glute min and OI to decrease spasm and pain and allow for improved balance of musculature for improved function and decreased symptoms. Educated on how to perform self TP release using a tennis ball to continue to decrease spasm and pain and allow for improved balance of musculature for improved function and decreased symptoms. Performed up-slip correction to LLE to  improve alignment and decrease pressure on nerve roots for decreased pain. Therex: Educated on and practiced side-stretch To maintain and improve muscle length and allow for improved balance of musculature for long-term symptom relief.   Total time: 60 min.                            PT Short Term Goals - 11/15/18 3536      PT SHORT TERM GOAL #1   Title  Patient will demonstrate improved pelvic alignment and balance of musculature surrounding the pelvis to facilitate decreased PFM spasms and decrease pelvic pain.    Baseline  Pt. demonstrates severe posterior pelvic tilt, L thoracic/R lumbar scoliosis, and R anterior/L posterior pelvic obliquity.    Time  5    Period  Weeks    Status  New    Target Date  12/20/18      PT SHORT TERM GOAL #2   Title  Patient will demonstrate a coordinated contraction, relaxation, and bulge of the pelvic floor muscles to demonstrate functional recruitment and motion and allow for bladder emptying.    Baseline  Pt. is unable to fully empty bladder and describes pain with catheterization, demonstrating high tone of PFM.    Time  5    Period  Weeks    Status  New    Target Date  12/20/18      PT SHORT TERM GOAL #3   Title  Patient will demonstrate improved sitting and standing posture to demonstrate learning and decrease stress on the pelvic floor with functional activity.    Baseline  Pt. demonstrates severe posterior pelvic tilt, L thoracic/R lumbar scoliosis, and R anterior/L posterior pelvic obliquity.    Time  5    Period  Weeks    Status  New    Target Date  12/20/18        PT Long Term Goals - 11/28/18 1709      PT LONG TERM GOAL #1   Title  Patient will describe pain no greater than 3/10 during Sitting for 2 hours to demonstrate improved functional ability.    Baseline  Max 5/10, worst with prolonged sitting.    Time  10    Period  Weeks    Status  On-going    Target Date  02/09/19      PT LONG TERM GOAL #2    Title  Patient will score less than or equal to 20% on the Female NIH-CPSI to demonstrate a reduction in pain, urinary symptoms, and an improved quality of life.    Baseline  To be given at 2nd appt.    Time  10    Period  Weeks    Status  On-going    Target Date  02/09/19      PT LONG TERM GOAL #3   Title  Pt. Will be able to cath without pain and will be cleared by physiscian to d/c self-cath due to decreased residual volume with bladder emptying.    Baseline  required to cath 3 times per day, painful each time.    Time  10    Period  Weeks    Status  On-going    Target Date  02/09/19      PT LONG TERM GOAL #4   Title  Patient will score less than or equal to 40% on the Female NIH-CPSI and 30% on the [facility] to demonstrate a reduction in pain, urinary symptoms, and an improved quality of life.    Baseline  Female NIH-CPSI: 38/43 (88%) , PDI: 45/75 (60%)    Time  10    Period  Weeks    Status  New    Target Date  02/09/19            Plan - 12/12/18 1701    Clinical Impression Statement  Pt. Responded well to all interventions today, demonstrating decreased spasm and pain reduction from 8/10 to 3/10 as well as understanding and correct performance of all education and exercises provided today. They will continue to benefit from skilled physical therapy to work toward remaining goals and maximize function as well as decrease likelihood of symptom increase or recurrence.    Personal Factors and Comorbidities  Age;Comorbidity 3+    Comorbidities  Osteoporosis, scoliosis, Arthritis, COPD, recent lumbosacral fusion for spondylolisthesis.    Examination-Activity Limitations  Toileting;Sit;Stand;Bend;Lift;Carry    Examination-Participation Restrictions  Interpersonal Relationship;Yard Work;Cleaning;Laundry    Stability/Clinical Decision Making  Unstable/Unpredictable    Rehab Potential  Good    PT Frequency  2x / week    PT Duration  Other (comment)   10 weeks   PT  Treatment/Interventions  ADLs/Self Care Home Management;Aquatic Therapy;Moist Heat;Electrical Stimulation;Traction;Therapeutic activities;Functional mobility training;Stair training;Gait training;Therapeutic exercise;Balance training;Neuromuscular re-education;Patient/family education;Manual techniques;Dry needling;Passive range of motion;Scar mobilization;Taping    PT Next Visit Plan  TPDN? to R Psoas and iliacus, review pelvic tilts, thoracic mobility/towel? mobs to sacrum, MET correction etc.    PT Home Exercise Plan  diaphragmatic breathing, hip-flexor stretch, side-stretch, pelvic tilts in seated, TA in mod-quad, diaphragmatic breathing.    Consulted and Agree with Plan of Care  Patient       Patient will benefit from skilled therapeutic intervention in order to improve the following deficits and impairments:  Abnormal gait, Decreased balance, Increased muscle spasms, Decreased range of motion, Decreased scar mobility, Improper body mechanics, Decreased coordination, Decreased strength, Increased fascial restricitons, Impaired flexibility, Postural dysfunction, Pain  Visit Diagnosis: 1. Abnormal posture   2. Other muscle spasm        Problem List Patient Active Problem List   Diagnosis Date Noted  . Chronic UTI 12/06/2018  . Status post lumbar surgery 12/06/2018  . Spondylolisthesis, lumbar region 09/26/2018  . Aortic atherosclerosis ([place]) 08/31/2018  . CAD (coronary artery disease) 02/23/2018  . Fatigue 01/20/2018  . Short of breath on exertion 10/17/2017  . Advanced care planning/counseling discussion 03/28/2017  . Varicose veins of both lower extremities with complications 12/21/4816  . Arthritis 12/09/2016  . Vaginal atrophy 09/10/2015  . Menopausal hot flushes 09/10/2015  . Menopause 09/10/2015  . Onychomycosis due to dermatophyte 04/07/2015  . Bursitis of right shoulder 02/26/2015  . Environmental and seasonal allergies 02/26/2015  . Other allergic rhinitis 02/26/2015   . Osteoporosis  11/26/2014  . COPD (chronic obstructive pulmonary disease) ([place]) 11/26/2014  . Fuchs' corneal dystrophy 11/26/2014  . Essential (primary) hypertension 11/26/2014  . Hyperlipidemia 11/26/2014  . Chronic kidney disease, stage III (moderate) ([place]) 11/26/2014  . Hypothyroidism 11/26/2014  . Low back pain 11/26/2014  . Endothelial corneal dystrophy 11/26/2014   Willa Rough DPT, ATC Willa Rough 12/12/2018, 5:08 PM  [place] MAIN [facility] SERVICES [address] [place], [place], [address] Phone: [phone]   Fax:  [phone]  Name: Julia Deleon MRN: [id number] Date of Birth: Oct 28, 1945

## 2018-12-12 NOTE — Patient Instructions (Signed)
    Sit up with a tall spine and cross one leg over the other knee. Hinge from the hip and lean until you can feel a stretch through your bottom hold for 5 deep breaths and then switch sides. Repeat 2-3 times on each side.      Put enough pressure on the tender spot that it is uncomfortable and take deep belly breaths until the pain is reduced by ~ 50% or more.

## 2018-12-14 ENCOUNTER — Ambulatory Visit: Payer: Medicare Other

## 2018-12-14 ENCOUNTER — Other Ambulatory Visit: Payer: Self-pay

## 2018-12-14 DIAGNOSIS — R293 Abnormal posture: Secondary | ICD-10-CM | POA: Diagnosis not present

## 2018-12-14 DIAGNOSIS — M62838 Other muscle spasm: Secondary | ICD-10-CM

## 2018-12-14 DIAGNOSIS — J301 Allergic rhinitis due to pollen: Secondary | ICD-10-CM | POA: Diagnosis not present

## 2018-12-14 NOTE — Therapy (Signed)
[place] MAIN [facility] SERVICES [address] [place], [place], [address] Phone: [phone]   Fax:  [phone]  Physical Therapy Treatment  The patient has been informed of current processes in place at Outpatient Rehab to protect patients from Covid-19 exposure including social distancing, schedule modifications, and new cleaning procedures. After discussing their particular risk with a therapist based on the patient's personal risk factors, the patient has decided to proceed with in-person therapy.   Patient Details  Name: Julia Deleon MRN: [id number] Date of Birth: 1946/05/06 Referring Provider (PT): Dr. Maryan Puls   Encounter Date: 12/14/2018  PT End of Session - 12/14/18 1512    Visit Number  7    Number of Visits  23    Date for PT Re-Evaluation  02/09/19    Authorization Type  Medicare    Authorization Time Period  through    Authorization - Visit Number  4    Authorization - Number of Visits  20    PT Start Time  4665    PT Stop Time  1505    PT Time Calculation (min)  60 min    Activity Tolerance  Patient tolerated treatment well;No increased pain    Behavior During Therapy  WFL for tasks assessed/performed       Past Medical History:  Diagnosis Date  . Arthritis   . COPD (chronic obstructive pulmonary disease) ([place])   . Family history of adverse reaction to anesthesia    father was slow to wake up  . Fuch's endothelial dystrophy   . Hyperlipidemia   . Hypothyroidism   . PONV (postoperative nausea and vomiting)    slow to wake up and PONV    Past Surgical History:  Procedure Laterality Date  . APPENDECTOMY    . BACK SURGERY  09/26/2018   L4-5 PLIF by Dr. Arnoldo Morale  . CARDIAC CATHETERIZATION    . CHOLECYSTECTOMY    . EYE SURGERY    . FOOT SURGERY Right    pin removed left  . laser vein surgery    . NASAL SINUS SURGERY    . RIGHT/LEFT HEART CATH AND CORONARY ANGIOGRAPHY N/A 12/06/2017   Procedure: RIGHT/LEFT HEART  CATH AND CORONARY ANGIOGRAPHY;  Surgeon: Yolonda Kida, MD;  Location: [place] [facility];  Service: Cardiovascular;  Laterality: N/A;  . TONSILLECTOMY      There were no vitals filed for this visit.    Pelvic Floor Physical Therapy Treatment Note  SCREENING  Changes in medications, allergies, or medical history?: none  SUBJECTIVE  Patient reports: Doing fair, about the same.  Precautions:  Vaginal atrophy, sciatica, osteoporosis  Pain update:  Location of pain: back, LLE Current pain:  6/10  Max pain:  9/10 Least pain:  5/10     Patient Goals: Improve bladder function, decrease back pain and increase strength and balance.   OBJECTIVE  Changes in: Posture/Observations:  Decreased but present forward lean, hyperkyphosis, and posterior pelvic tilt   Palpation: TTP to L Glute Min. and OI  INTERVENTIONS THIS SESSION: Manual: Performed TP release to B adductors to decrease spasm and pain and allow for improved balance of musculature for improved function and decreased symptoms.  Therex: Educated on and practiced supine adductor stretch To maintain and improve muscle length and allow for improved balance of musculature for long-term symptom relief.   Total time: 60 min.  Trigger Point Dry Needling - 12/14/18 0001    Consent Given?  Yes    Education Handout Provided  No    Muscles Treated Lower Quadrant  Adductor longus/brevis/magnus    Adductor Response  Twitch response elicited;Palpable increased muscle length             PT Short Term Goals - 11/15/18 0837      PT SHORT TERM GOAL #1   Title  Patient will demonstrate improved pelvic alignment and balance of musculature surrounding the pelvis to facilitate decreased PFM spasms and decrease pelvic pain.    Baseline  Pt. demonstrates severe posterior pelvic tilt, L thoracic/R lumbar scoliosis, and R anterior/L posterior pelvic obliquity.    Time  5    Period   Weeks    Status  New    Target Date  12/20/18      PT SHORT TERM GOAL #2   Title  Patient will demonstrate a coordinated contraction, relaxation, and bulge of the pelvic floor muscles to demonstrate functional recruitment and motion and allow for bladder emptying.    Baseline  Pt. is unable to fully empty bladder and describes pain with catheterization, demonstrating high tone of PFM.    Time  5    Period  Weeks    Status  New    Target Date  12/20/18      PT SHORT TERM GOAL #3   Title  Patient will demonstrate improved sitting and standing posture to demonstrate learning and decrease stress on the pelvic floor with functional activity.    Baseline  Pt. demonstrates severe posterior pelvic tilt, L thoracic/R lumbar scoliosis, and R anterior/L posterior pelvic obliquity.    Time  5    Period  Weeks    Status  New    Target Date  12/20/18        PT Long Term Goals - 11/28/18 1709      PT LONG TERM GOAL #1   Title  Patient will describe pain no greater than 3/10 during Sitting for 2 hours to demonstrate improved functional ability.    Baseline  Max 5/10, worst with prolonged sitting.    Time  10    Period  Weeks    Status  On-going    Target Date  02/09/19      PT LONG TERM GOAL #2   Title  Patient will score less than or equal to 20% on the Female NIH-CPSI to demonstrate a reduction in pain, urinary symptoms, and an improved quality of life.    Baseline  To be given at 2nd appt.    Time  10    Period  Weeks    Status  On-going    Target Date  02/09/19      PT LONG TERM GOAL #3   Title  Pt. Will be able to cath without pain and will be cleared by physiscian to d/c self-cath due to decreased residual volume with bladder emptying.    Baseline  required to cath 3 times per day, painful each time.    Time  10    Period  Weeks    Status  On-going    Target Date  02/09/19      PT LONG TERM GOAL #4   Title  Patient will score less than or equal to 40% on the Female NIH-CPSI  and 30% on the [facility] to demonstrate a reduction in pain, urinary symptoms, and an improved quality of life.  Baseline  Female NIH-CPSI: 38/43 (88%) , PDI: 45/75 (60%)    Time  10    Period  Weeks    Status  New    Target Date  02/09/19            Plan - 12/14/18 1512    Clinical Impression Statement  Pt. Responded well to all interventions today, demonstrating improved hip ABD/ER ROM and decreased spasms as well as understanding and correct performance of all education and exercises provided today. They will continue to benefit from skilled physical therapy to work toward remaining goals and maximize function as well as decrease likelihood of symptom increase or recurrence.    Personal Factors and Comorbidities  Age;Comorbidity 3+    Comorbidities  Osteoporosis, scoliosis, Arthritis, COPD, recent lumbosacral fusion for spondylolisthesis.    Examination-Activity Limitations  Toileting;Sit;Stand;Bend;Lift;Carry    Examination-Participation Restrictions  Interpersonal Relationship;Yard Work;Cleaning;Laundry    Stability/Clinical Decision Making  Unstable/Unpredictable    Rehab Potential  Good    PT Frequency  2x / week    PT Duration  Other (comment)   10 weeks   PT Treatment/Interventions  ADLs/Self Care Home Management;Aquatic Therapy;Moist Heat;Electrical Stimulation;Traction;Therapeutic activities;Functional mobility training;Stair training;Gait training;Therapeutic exercise;Balance training;Neuromuscular re-education;Patient/family education;Manual techniques;Dry needling;Passive range of motion;Scar mobilization;Taping    PT Next Visit Plan  TPDN? Internal TP release on L, review pelvic tilts, thoracic mobility/towel? mobs to sacrum, MET correction etc.    PT Home Exercise Plan  diaphragmatic breathing, hip-flexor stretch, side-stretch, pelvic tilts in seated, TA in mod-quad, diaphragmatic breathing, spine butterfly.    Consulted and Agree with Plan of Care  Patient       Patient  will benefit from skilled therapeutic intervention in order to improve the following deficits and impairments:  Abnormal gait, Decreased balance, Increased muscle spasms, Decreased range of motion, Decreased scar mobility, Improper body mechanics, Decreased coordination, Decreased strength, Increased fascial restricitons, Impaired flexibility, Postural dysfunction, Pain  Visit Diagnosis: 1. Abnormal posture   2. Other muscle spasm        Problem List Patient Active Problem List   Diagnosis Date Noted  . Chronic UTI 12/06/2018  . Status post lumbar surgery 12/06/2018  . Spondylolisthesis, lumbar region 09/26/2018  . Aortic atherosclerosis ([place]) 08/31/2018  . CAD (coronary artery disease) 02/23/2018  . Fatigue 01/20/2018  . Short of breath on exertion 10/17/2017  . Advanced care planning/counseling discussion 03/28/2017  . Varicose veins of both lower extremities with complications 08/81/1031  . Arthritis 12/09/2016  . Vaginal atrophy 09/10/2015  . Menopausal hot flushes 09/10/2015  . Menopause 09/10/2015  . Onychomycosis due to dermatophyte 04/07/2015  . Bursitis of right shoulder 02/26/2015  . Environmental and seasonal allergies 02/26/2015  . Other allergic rhinitis 02/26/2015  . Osteoporosis 11/26/2014  . COPD (chronic obstructive pulmonary disease) ([place]) 11/26/2014  . Fuchs' corneal dystrophy 11/26/2014  . Essential (primary) hypertension 11/26/2014  . Hyperlipidemia 11/26/2014  . Chronic kidney disease, stage III (moderate) ([place]) 11/26/2014  . Hypothyroidism 11/26/2014  . Low back pain 11/26/2014  . Endothelial corneal dystrophy 11/26/2014   Willa Rough DPT, ATC Willa Rough 12/14/2018, 3:23 PM  [place] MAIN [facility] SERVICES [address] [place], [place], [address] Phone: [phone]   Fax:  [phone]  Name: Brielyn C Porath MRN: [id number] Date of Birth: 22-Jul-1945

## 2018-12-14 NOTE — Patient Instructions (Signed)
  Let knees drop to the sides, with feet together. Take 5 deep belly breaths and then repeat 2 more times. Do this 1-3 times per day.  *If your muscles are not relaxing, try propping the outer hip with a pillow to allow for a more gentle stretch,

## 2018-12-18 DIAGNOSIS — J301 Allergic rhinitis due to pollen: Secondary | ICD-10-CM | POA: Diagnosis not present

## 2018-12-19 ENCOUNTER — Other Ambulatory Visit: Payer: Self-pay

## 2018-12-19 ENCOUNTER — Ambulatory Visit: Payer: Medicare Other

## 2018-12-19 DIAGNOSIS — M62838 Other muscle spasm: Secondary | ICD-10-CM | POA: Diagnosis not present

## 2018-12-19 DIAGNOSIS — R293 Abnormal posture: Secondary | ICD-10-CM | POA: Diagnosis not present

## 2018-12-19 NOTE — Patient Instructions (Addendum)
   Let the top arm rest on your side, and slide along the torso as you rotate (different from pictured). Breathe in as you come forward, out as you open up, leading with the shoulder (NOT NECK!!). Do 2x15 on each side.  *let top knee rest on bed, slide arm forward and back on your tummy but feel the rotation through your spine.    Use this position instead of the seated piriformis stretch (figure 4). Hold for 5 deep breaths, repeat 2-3 times on each side, 1-3 times per day.  Self Posterior Fourchette Stretching/Mobilization    1) Wash your hands and prop your body up so you can easily reach the vagina, bring hand-held mirror if desired.  2) Apply lubricant to the thumb and vaginal opening  3) Place thumb ~ 1/2 an inch into the vagina with the pad of the thumb pointed down and apply gentle pressure to the posterior fourchette.  4) Gently sweep the thumb side to side and in/out while maintaining pressure down toward the anus. Make sure the pressure is not so great that your muscles tighten up and guard, just enough to create slight discomfort.  Do this for ~ 3 min. Per night to decrease tightness and tenderness at the vaginal opening.

## 2018-12-19 NOTE — Therapy (Signed)
[place] MAIN [facility] SERVICES [address] [place], [place], [address] Phone: [phone]   Fax:  [phone]  Physical Therapy Treatment  The patient has been informed of current processes in place at Outpatient Rehab to protect patients from Covid-19 exposure including social distancing, schedule modifications, and new cleaning procedures. After discussing their particular risk with a therapist based on the patient's personal risk factors, the patient has decided to proceed with in-person therapy.   Patient Details  Name: Julia Deleon MRN: [id number] Date of Birth: [birth year] Referring Provider (PT): Dr. Maryan Puls   Encounter Date: 12/19/2018  PT End of Session - 12/19/18 1644    Visit Number  8    Number of Visits  23    Date for PT Re-Evaluation  02/09/19    Authorization Type  Medicare    Authorization Time Period  through    Authorization - Visit Number  5    Authorization - Number of Visits  20    PT Start Time  1950    PT Stop Time  1630    PT Time Calculation (min)  55 min    Activity Tolerance  Patient tolerated treatment well;No increased pain    Behavior During Therapy  WFL for tasks assessed/performed       Past Medical History:  Diagnosis Date  . Arthritis   . COPD (chronic obstructive pulmonary disease) ([place])   . Family history of adverse reaction to anesthesia    father was slow to wake up  . Fuch's endothelial dystrophy   . Hyperlipidemia   . Hypothyroidism   . PONV (postoperative nausea and vomiting)    slow to wake up and PONV    Past Surgical History:  Procedure Laterality Date  . APPENDECTOMY    . BACK SURGERY  09/26/2018   L4-5 PLIF by Dr. Arnoldo Morale  . CARDIAC CATHETERIZATION    . CHOLECYSTECTOMY    . EYE SURGERY    . FOOT SURGERY Right    pin removed left  . laser vein surgery    . NASAL SINUS SURGERY    . RIGHT/LEFT HEART CATH AND CORONARY ANGIOGRAPHY N/A 12/06/2017   Procedure: RIGHT/LEFT HEART  CATH AND CORONARY ANGIOGRAPHY;  Surgeon: Yolonda Kida, MD;  Location: [facility];  Service: Cardiovascular;  Laterality: N/A;  . TONSILLECTOMY      There were no vitals filed for this visit.    Pelvic Floor Physical Therapy Treatment Note  SCREENING  Changes in medications, allergies, or medical history?: none  SUBJECTIVE  Patient reports: She only is measuring on Tues, Wed, Thurs. Still having trouble at lunch time. Urinates 2-3 times in the morning with decreasing amounts each time. Believes the muscle relaxers are helping with this a lot but she is not able to use them throughout the day.  Precautions:  Vaginal atrophy, sciatica, osteoporosis  Pain update:  Location of pain: back, LLE Current pain:  7/10  Max pain:  9/10 Least pain:  3/10     Patient Goals: Improve bladder function, decrease back pain and increase strength and balance.   OBJECTIVE  Changes in: Posture/Observations:  Decreased but present forward lean, hyperkyphosis, and posterior pelvic tilt  ROM: Decreased thoracic rotation B, greater restriction to R.  Pelvic floor: TTP to L anterior and posterior PR/PC, OI, and at posterior fourchette.  INTERVENTIONS THIS SESSION: Therex: Modified piriformis stretch To maintain and improve muscle length and allow for improved balance of musculature for  long-term symptom relief. Gave bow-and-arrow to improve mobility of joint and surrounding connective tissue and decrease pressure on nerve roots for improved conductivity and function of down-stream tissues.  Performed TP release internally to L anterior PR, OI, posterior PR/PC, and posterior fourchette and educated on how to perform self posterior fourchette massage/release to decrease spasm and pain and allow for improved balance of musculature for improved function and decreased symptoms.  Total time: 55 min.                           PT Short Term Goals - 11/15/18  4239      PT SHORT TERM GOAL #1   Title  Patient will demonstrate improved pelvic alignment and balance of musculature surrounding the pelvis to facilitate decreased PFM spasms and decrease pelvic pain.    Baseline  Pt. demonstrates severe posterior pelvic tilt, L thoracic/R lumbar scoliosis, and R anterior/L posterior pelvic obliquity.    Time  5    Period  Weeks    Status  New    Target Date  12/20/18      PT SHORT TERM GOAL #2   Title  Patient will demonstrate a coordinated contraction, relaxation, and bulge of the pelvic floor muscles to demonstrate functional recruitment and motion and allow for bladder emptying.    Baseline  Pt. is unable to fully empty bladder and describes pain with catheterization, demonstrating high tone of PFM.    Time  5    Period  Weeks    Status  New    Target Date  12/20/18      PT SHORT TERM GOAL #3   Title  Patient will demonstrate improved sitting and standing posture to demonstrate learning and decrease stress on the pelvic floor with functional activity.    Baseline  Pt. demonstrates severe posterior pelvic tilt, L thoracic/R lumbar scoliosis, and R anterior/L posterior pelvic obliquity.    Time  5    Period  Weeks    Status  New    Target Date  12/20/18        PT Long Term Goals - 11/28/18 1709      PT LONG TERM GOAL #1   Title  Patient will describe pain no greater than 3/10 during Sitting for 2 hours to demonstrate improved functional ability.    Baseline  Max 5/10, worst with prolonged sitting.    Time  10    Period  Weeks    Status  On-going    Target Date  02/09/19      PT LONG TERM GOAL #2   Title  Patient will score less than or equal to 20% on the Female NIH-CPSI to demonstrate a reduction in pain, urinary symptoms, and an improved quality of life.    Baseline  To be given at 2nd appt.    Time  10    Period  Weeks    Status  On-going    Target Date  02/09/19      PT LONG TERM GOAL #3   Title  Pt. Will be able to cath  without pain and will be cleared by physiscian to d/c self-cath due to decreased residual volume with bladder emptying.    Baseline  required to cath 3 times per day, painful each time.    Time  10    Period  Weeks    Status  On-going    Target Date  02/09/19  PT LONG TERM GOAL #4   Title  Patient will score less than or equal to 40% on the Female NIH-CPSI and 30% on the [facility] to demonstrate a reduction in pain, urinary symptoms, and an improved quality of life.    Baseline  Female NIH-CPSI: 38/43 (88%) , PDI: 45/75 (60%)    Time  10    Period  Weeks    Status  New    Target Date  02/09/19            Plan - 12/20/18 1147    Clinical Impression Statement  Pt. Responded well to all interventions today, demonstrating improved ROM, decreased spasm and tenderness, as well as understanding and correct performance of all education and exercises provided today. They will continue to benefit from skilled physical therapy to work toward remaining goals and maximize function as well as decrease likelihood of symptom increase or recurrence.     Personal Factors and Comorbidities  Age;Comorbidity 3+    Comorbidities  Osteoporosis, scoliosis, Arthritis, COPD, recent lumbosacral fusion for spondylolisthesis.    Examination-Activity Limitations  Toileting;Sit;Stand;Bend;Lift;Carry    Examination-Participation Restrictions  Interpersonal Relationship;Yard Work;Cleaning;Laundry    Stability/Clinical Decision Making  Unstable/Unpredictable    Rehab Potential  Good    PT Frequency  2x / week    PT Duration  Other (comment)   10 weeks   PT Treatment/Interventions  ADLs/Self Care Home Management;Aquatic Therapy;Moist Heat;Electrical Stimulation;Traction;Therapeutic activities;Functional mobility training;Stair training;Gait training;Therapeutic exercise;Balance training;Neuromuscular re-education;Patient/family education;Manual techniques;Dry needling;Passive range of motion;Scar mobilization;Taping     PT Next Visit Plan  TPDN?, review pelvic tilts and TA in mod-quad, thoracic mobility/towel? mobs to sacrum, MET correction etc.  Internal TP release at L coccygeus.    PT Home Exercise Plan  diaphragmatic breathing, hip-flexor stretch, side-stretch, pelvic tilts in seated, TA in mod-quad, diaphragmatic breathing, supine butterfly, bow-and-arrow, posterior fourchette massage.    Consulted and Agree with Plan of Care  Patient       Patient will benefit from skilled therapeutic intervention in order to improve the following deficits and impairments:  Abnormal gait, Decreased balance, Increased muscle spasms, Decreased range of motion, Decreased scar mobility, Improper body mechanics, Decreased coordination, Decreased strength, Increased fascial restricitons, Impaired flexibility, Postural dysfunction, Pain  Visit Diagnosis: 1. Abnormal posture   2. Other muscle spasm        Problem List Patient Active Problem List   Diagnosis Date Noted  . Chronic UTI 12/06/2018  . Status post lumbar surgery 12/06/2018  . Spondylolisthesis, lumbar region 09/26/2018  . Aortic atherosclerosis ([place]) 08/31/2018  . CAD (coronary artery disease) 02/23/2018  . Fatigue 01/20/2018  . Short of breath on exertion 10/17/2017  . Advanced care planning/counseling discussion 03/28/2017  . Varicose veins of both lower extremities with complications 88/03/314  . Arthritis 12/09/2016  . Vaginal atrophy 09/10/2015  . Menopausal hot flushes 09/10/2015  . Menopause 09/10/2015  . Onychomycosis due to dermatophyte 04/07/2015  . Bursitis of right shoulder 02/26/2015  . Environmental and seasonal allergies 02/26/2015  . Other allergic rhinitis 02/26/2015  . Osteoporosis 11/26/2014  . COPD (chronic obstructive pulmonary disease) ([place]) 11/26/2014  . Fuchs' corneal dystrophy 11/26/2014  . Essential (primary) hypertension 11/26/2014  . Hyperlipidemia 11/26/2014  . Chronic kidney disease, stage III (moderate) ([place])  11/26/2014  . Hypothyroidism 11/26/2014  . Low back pain 11/26/2014  . Endothelial corneal dystrophy 11/26/2014   Willa Rough DPT, ATC Willa Rough 12/20/2018, 11:56 AM  [place] [facility]  [place], [place], [address] Phone: [phone]   Fax:  [phone]  Name: Julia Deleon MRN: [id number] Date of Birth: [birth year]

## 2018-12-21 DIAGNOSIS — J301 Allergic rhinitis due to pollen: Secondary | ICD-10-CM | POA: Diagnosis not present

## 2018-12-22 ENCOUNTER — Ambulatory Visit: Payer: Medicare Other

## 2018-12-22 ENCOUNTER — Other Ambulatory Visit: Payer: Self-pay

## 2018-12-22 DIAGNOSIS — R293 Abnormal posture: Secondary | ICD-10-CM

## 2018-12-22 DIAGNOSIS — M62838 Other muscle spasm: Secondary | ICD-10-CM

## 2018-12-22 NOTE — Therapy (Signed)
[place] MAIN [facility] SERVICES [address] [place], [place], [address] Phone: [phone]   Fax:  [phone]  Physical Therapy Treatment  The patient has been informed of current processes in place at Outpatient Rehab to protect patients from Covid-19 exposure including social distancing, schedule modifications, and new cleaning procedures. After discussing their particular risk with a therapist based on the patient's personal risk factors, the patient has decided to proceed with in-person therapy.   Patient Details  Name: Julia Deleon MRN: [id number] Date of Birth: 1946-04-27 Referring Provider (PT): Dr. Maryan Puls   Encounter Date: 12/22/2018  PT End of Session - 12/22/18 1149    Visit Number  9    Number of Visits  23    Date for PT Re-Evaluation  02/09/19    Authorization Type  Medicare    Authorization Time Period  through    Authorization - Visit Number  6    Authorization - Number of Visits  20    PT Start Time  0827    PT Stop Time  0904    PT Time Calculation (min)  37 min    Activity Tolerance  Patient tolerated treatment well;No increased pain    Behavior During Therapy  WFL for tasks assessed/performed       Past Medical History:  Diagnosis Date  . Arthritis   . COPD (chronic obstructive pulmonary disease) ([place])   . Family history of adverse reaction to anesthesia    father was slow to wake up  . Fuch's endothelial dystrophy   . Hyperlipidemia   . Hypothyroidism   . PONV (postoperative nausea and vomiting)    slow to wake up and PONV    Past Surgical History:  Procedure Laterality Date  . APPENDECTOMY    . BACK SURGERY  09/26/2018   L4-5 PLIF by Dr. Arnoldo Morale  . CARDIAC CATHETERIZATION    . CHOLECYSTECTOMY    . EYE SURGERY    . FOOT SURGERY Right    pin removed left  . laser vein surgery    . NASAL SINUS SURGERY    . RIGHT/LEFT HEART CATH AND CORONARY ANGIOGRAPHY N/A 12/06/2017   Procedure: RIGHT/LEFT HEART  CATH AND CORONARY ANGIOGRAPHY;  Surgeon: Yolonda Kida, MD;  Location: [facility];  Service: Cardiovascular;  Laterality: N/A;  . TONSILLECTOMY      There were no vitals filed for this visit.     Pelvic Floor Physical Therapy Treatment Note  SCREENING  Changes in medications, allergies, or medical history?: none  SUBJECTIVE  Patient reports: She had a really good day on Wednesday, pain as low as a 3/10 all day until she went for a walk in the afternoon.  Precautions:  Vaginal atrophy, sciatica, osteoporosis  Pain update:  Location of pain: back, LLE Current pain:  7/10  Max pain:  9/10 Least pain:  3/10     Patient Goals: Improve bladder function, decrease back pain and increase strength and balance.   OBJECTIVE  Changes in:  Mobility/ROM: Decreased sacral mobility and pain on R>L sacral border and more near apex than base.  Has maintained R rot. ROM gained at last session but not gained.   INTERVENTIONS THIS SESSION: Therex: Reviewed bow-and-arrow rotation mobs and applied overpressure with deep diaphragmatic breathing to allow for improved relaxation and ROM for decreased pressure on nerve roots and improved neural transmission. Manual: Performed grade 3-4 PA mobs through B sacral borders to decrease spasm and pain  and allow for improved balance of musculature for improved function and decreased symptoms.   Total time: 37 min.                              PT Short Term Goals - 11/15/18 4166      PT SHORT TERM GOAL #1   Title  Patient will demonstrate improved pelvic alignment and balance of musculature surrounding the pelvis to facilitate decreased PFM spasms and decrease pelvic pain.    Baseline  Pt. demonstrates severe posterior pelvic tilt, L thoracic/R lumbar scoliosis, and R anterior/L posterior pelvic obliquity.    Time  5    Period  Weeks    Status  New    Target Date  12/20/18      PT SHORT TERM GOAL  #2   Title  Patient will demonstrate a coordinated contraction, relaxation, and bulge of the pelvic floor muscles to demonstrate functional recruitment and motion and allow for bladder emptying.    Baseline  Pt. is unable to fully empty bladder and describes pain with catheterization, demonstrating high tone of PFM.    Time  5    Period  Weeks    Status  New    Target Date  12/20/18      PT SHORT TERM GOAL #3   Title  Patient will demonstrate improved sitting and standing posture to demonstrate learning and decrease stress on the pelvic floor with functional activity.    Baseline  Pt. demonstrates severe posterior pelvic tilt, L thoracic/R lumbar scoliosis, and R anterior/L posterior pelvic obliquity.    Time  5    Period  Weeks    Status  New    Target Date  12/20/18        PT Long Term Goals - 11/28/18 1709      PT LONG TERM GOAL #1   Title  Patient will describe pain no greater than 3/10 during Sitting for 2 hours to demonstrate improved functional ability.    Baseline  Max 5/10, worst with prolonged sitting.    Time  10    Period  Weeks    Status  On-going    Target Date  02/09/19      PT LONG TERM GOAL #2   Title  Patient will score less than or equal to 20% on the Female NIH-CPSI to demonstrate a reduction in pain, urinary symptoms, and an improved quality of life.    Baseline  To be given at 2nd appt.    Time  10    Period  Weeks    Status  On-going    Target Date  02/09/19      PT LONG TERM GOAL #3   Title  Pt. Will be able to cath without pain and will be cleared by physiscian to d/c self-cath due to decreased residual volume with bladder emptying.    Baseline  required to cath 3 times per day, painful each time.    Time  10    Period  Weeks    Status  On-going    Target Date  02/09/19      PT LONG TERM GOAL #4   Title  Patient will score less than or equal to 40% on the Female NIH-CPSI and 30% on the [facility] to demonstrate a reduction in pain, urinary symptoms, and  an improved quality of life.    Baseline  Female NIH-CPSI: 38/43 (88%) ,  [place]: 45/75 (60%)    Time  10    Period  Weeks    Status  New    Target Date  02/09/19            Plan - 12/22/18 1150    Clinical Impression Statement  Pt. Was late for her appointment today but reports having a really good day following last treatment session. Responded well to all interventions today, demonstrating improved sacral mobility and thoracic rotation ROM as well as understanding and correct performance of all education and exercises provided today. They will continue to benefit from skilled physical therapy to work toward remaining goals and maximize function as well as decrease likelihood of symptom increase or recurrence.     Personal Factors and Comorbidities  Age;Comorbidity 3+    Comorbidities  Osteoporosis, scoliosis, Arthritis, COPD, recent lumbosacral fusion for spondylolisthesis.    Examination-Activity Limitations  Toileting;Sit;Stand;Bend;Lift;Carry    Examination-Participation Restrictions  Interpersonal Relationship;Yard Work;Cleaning;Laundry    Stability/Clinical Decision Making  Unstable/Unpredictable    Rehab Potential  Good    PT Frequency  2x / week    PT Duration  Other (comment)   10 weeks   PT Treatment/Interventions  ADLs/Self Care Home Management;Aquatic Therapy;Moist Heat;Electrical Stimulation;Traction;Therapeutic activities;Functional mobility training;Stair training;Gait training;Therapeutic exercise;Balance training;Neuromuscular re-education;Patient/family education;Manual techniques;Dry needling;Passive range of motion;Scar mobilization;Taping    PT Next Visit Plan  TPDN?, review pelvic tilts and TA in mod-quad, thoracic mobility/towel? mobs to sacrum, MET correction etc.  Internal TP release at L coccygeus. Educate on where to buy internal tool    PT Home Exercise Plan  diaphragmatic breathing, hip-flexor stretch, side-stretch, pelvic tilts in seated, TA in mod-quad,  diaphragmatic breathing, supine butterfly, bow-and-arrow, posterior fourchette massage.    Consulted and Agree with Plan of Care  Patient       Patient will benefit from skilled therapeutic intervention in order to improve the following deficits and impairments:  Abnormal gait, Decreased balance, Increased muscle spasms, Decreased range of motion, Decreased scar mobility, Improper body mechanics, Decreased coordination, Decreased strength, Increased fascial restricitons, Impaired flexibility, Postural dysfunction, Pain  Visit Diagnosis: 1. Abnormal posture   2. Other muscle spasm        Problem List Patient Active Problem List   Diagnosis Date Noted  . Chronic UTI 12/06/2018  . Status post lumbar surgery 12/06/2018  . Spondylolisthesis, lumbar region 09/26/2018  . Aortic atherosclerosis ([place]) 08/31/2018  . CAD (coronary artery disease) 02/23/2018  . Fatigue 01/20/2018  . Short of breath on exertion 10/17/2017  . Advanced care planning/counseling discussion 03/28/2017  . Varicose veins of both lower extremities with complications 42/35/3614  . Arthritis 12/09/2016  . Vaginal atrophy 09/10/2015  . Menopausal hot flushes 09/10/2015  . Menopause 09/10/2015  . Onychomycosis due to dermatophyte 04/07/2015  . Bursitis of right shoulder 02/26/2015  . Environmental and seasonal allergies 02/26/2015  . Other allergic rhinitis 02/26/2015  . Osteoporosis 11/26/2014  . COPD (chronic obstructive pulmonary disease) ([place]) 11/26/2014  . Fuchs' corneal dystrophy 11/26/2014  . Essential (primary) hypertension 11/26/2014  . Hyperlipidemia 11/26/2014  . Chronic kidney disease, stage III (moderate) ([place]) 11/26/2014  . Hypothyroidism 11/26/2014  . Low back pain 11/26/2014  . Endothelial corneal dystrophy 11/26/2014   Willa Rough DPT, ATC Willa Rough 12/22/2018, 11:56 AM  [place] MAIN [facility] SERVICES [address] [place], [place],  [address] Phone: [phone]   Fax:  [phone]  Name: Cherylanne C Mcfarlane MRN: [id number] Date of Birth: [birth year]

## 2018-12-26 ENCOUNTER — Ambulatory Visit: Payer: Medicare Other

## 2018-12-26 ENCOUNTER — Other Ambulatory Visit: Payer: Self-pay

## 2018-12-26 DIAGNOSIS — M62838 Other muscle spasm: Secondary | ICD-10-CM

## 2018-12-26 DIAGNOSIS — R293 Abnormal posture: Secondary | ICD-10-CM

## 2018-12-26 NOTE — Therapy (Signed)
[place] MAIN [facility] SERVICES [address] [place], [place], [address] Phone: [phone]   Fax:  [phone]  Physical Therapy Treatment  The patient has been informed of current processes in place at Outpatient Rehab to protect patients from Covid-19 exposure including social distancing, schedule modifications, and new cleaning procedures. After discussing their particular risk with a therapist based on the patient's personal risk factors, the patient has decided to proceed with in-person therapy.  Patient Details  Name: Julia Deleon MRN: [id number] Date of Birth: 05/31/1945 Referring Provider (PT): Dr. Maryan Puls   Encounter Date: 12/26/2018  PT End of Session - 12/26/18 1601    Visit Number  10    Number of Visits  23    Date for PT Re-Evaluation  02/09/19    Authorization Type  Medicare    Authorization Time Period  through    Authorization - Visit Number  7    Authorization - Number of Visits  20    PT Start Time  3762    PT Stop Time  1532    PT Time Calculation (min)  70 min    Activity Tolerance  Patient tolerated treatment well;No increased pain    Behavior During Therapy  WFL for tasks assessed/performed       Past Medical History:  Diagnosis Date  . Arthritis   . COPD (chronic obstructive pulmonary disease) ([place])   . Family history of adverse reaction to anesthesia    father was slow to wake up  . Fuch's endothelial dystrophy   . Hyperlipidemia   . Hypothyroidism   . PONV (postoperative nausea and vomiting)    slow to wake up and PONV    Past Surgical History:  Procedure Laterality Date  . APPENDECTOMY    . BACK SURGERY  09/26/2018   L4-5 PLIF by Dr. Arnoldo Morale  . CARDIAC CATHETERIZATION    . CHOLECYSTECTOMY    . EYE SURGERY    . FOOT SURGERY Right    pin removed left  . laser vein surgery    . NASAL SINUS SURGERY    . RIGHT/LEFT HEART CATH AND CORONARY ANGIOGRAPHY N/A 12/06/2017   Procedure: RIGHT/LEFT HEART  CATH AND CORONARY ANGIOGRAPHY;  Surgeon: Yolonda Kida, MD;  Location: [place] [facility];  Service: Cardiovascular;  Laterality: N/A;  . TONSILLECTOMY      There were no vitals filed for this visit.   Pelvic Floor Physical Therapy Treatment Note  SCREENING  Changes in medications, allergies, or medical history?: none  SUBJECTIVE  Patient reports: Is doing a little bit better. Went to the chiropractor on Friday. Felt better overall but picked up too many groceries   Precautions:  Vaginal atrophy, sciatica, osteoporosis  Pain update:  Location of pain: back, LLE Current pain:  6/10  Max pain:  9/10 Least pain:  3/10     Patient Goals: Improve bladder function, decrease back pain and increase strength and balance.   OBJECTIVE  Changes in:  Mobility/ROM: Pt. Has improved in R Rot. ROM but only maintained with L rot ROM.   Range of Motion/Flexibilty:  Spine: Hips:  Pelvic Floor TTP to all major areas B, with greatest TTP at B coccygeus. All treated muscles resolved but insufficient time to treat R anterior PR, OI, and posterior fourchette.   INTERVENTIONS THIS SESSION: Manual: Performed TP release internally to all PFM on L and Posterior PR and coccygeus on R to decrease spasm and pain and allow for  improved balance of musculature for improved function and decreased symptoms. Reviewed and practiced thoracic rotation and applied overpressure with rotation to improve tissue length and joint mobility to decrease scoliosis and pressure on nerve roots.  Self-care: educated on how to use and where to buy a tool for self internal release to continue to decrease pain and difficulty with urination.  Total time: 70 min.                             PT Short Term Goals - 11/15/18 2426      PT SHORT TERM GOAL #1   Title  Patient will demonstrate improved pelvic alignment and balance of musculature surrounding the pelvis to facilitate  decreased PFM spasms and decrease pelvic pain.    Baseline  Pt. demonstrates severe posterior pelvic tilt, L thoracic/R lumbar scoliosis, and R anterior/L posterior pelvic obliquity.    Time  5    Period  Weeks    Status  New    Target Date  12/20/18      PT SHORT TERM GOAL #2   Title  Patient will demonstrate a coordinated contraction, relaxation, and bulge of the pelvic floor muscles to demonstrate functional recruitment and motion and allow for bladder emptying.    Baseline  Pt. is unable to fully empty bladder and describes pain with catheterization, demonstrating high tone of PFM.    Time  5    Period  Weeks    Status  New    Target Date  12/20/18      PT SHORT TERM GOAL #3   Title  Patient will demonstrate improved sitting and standing posture to demonstrate learning and decrease stress on the pelvic floor with functional activity.    Baseline  Pt. demonstrates severe posterior pelvic tilt, L thoracic/R lumbar scoliosis, and R anterior/L posterior pelvic obliquity.    Time  5    Period  Weeks    Status  New    Target Date  12/20/18        PT Long Term Goals - 11/28/18 1709      PT LONG TERM GOAL #1   Title  Patient will describe pain no greater than 3/10 during Sitting for 2 hours to demonstrate improved functional ability.    Baseline  Max 5/10, worst with prolonged sitting.    Time  10    Period  Weeks    Status  On-going    Target Date  02/09/19      PT LONG TERM GOAL #2   Title  Patient will score less than or equal to 20% on the Female NIH-CPSI to demonstrate a reduction in pain, urinary symptoms, and an improved quality of life.    Baseline  To be given at 2nd appt.    Time  10    Period  Weeks    Status  On-going    Target Date  02/09/19      PT LONG TERM GOAL #3   Title  Pt. Will be able to cath without pain and will be cleared by physiscian to d/c self-cath due to decreased residual volume with bladder emptying.    Baseline  required to cath 3 times per  day, painful each time.    Time  10    Period  Weeks    Status  On-going    Target Date  02/09/19      PT LONG TERM GOAL #4  Title  Patient will score less than or equal to 40% on the Female NIH-CPSI and 30% on the [facility] to demonstrate a reduction in pain, urinary symptoms, and an improved quality of life.    Baseline  Female NIH-CPSI: 38/43 (88%) , PDI: 45/75 (60%)    Time  10    Period  Weeks    Status  New    Target Date  02/09/19            Plan - 12/26/18 1602    Clinical Impression Statement  Pt. Responded well to all interventions today, demonstrating improved thoracolumbar mobility and decreased spasms/improved body awareness as well as understanding and correct performance of all education and exercises provided today. They will continue to benefit from skilled physical therapy to work toward remaining goals and maximize function as well as decrease likelihood of symptom increase or recurrence.     Personal Factors and Comorbidities  Age;Comorbidity 3+    Comorbidities  Osteoporosis, scoliosis, Arthritis, COPD, recent lumbosacral fusion for spondylolisthesis.    Examination-Activity Limitations  Toileting;Sit;Stand;Bend;Lift;Carry    Examination-Participation Restrictions  Interpersonal Relationship;Yard Work;Cleaning;Laundry    Stability/Clinical Decision Making  Unstable/Unpredictable    Rehab Potential  Good    PT Frequency  2x / week    PT Duration  Other (comment)   10 weeks   PT Treatment/Interventions  ADLs/Self Care Home Management;Aquatic Therapy;Moist Heat;Electrical Stimulation;Traction;Therapeutic activities;Functional mobility training;Stair training;Gait training;Therapeutic exercise;Balance training;Neuromuscular re-education;Patient/family education;Manual techniques;Dry needling;Passive range of motion;Scar mobilization;Taping    PT Next Visit Plan  TPDN?, review pelvic tilts and TA in mod-quad, thoracic mobility/towel? mobs to sacrum, MET correction etc.   Internal TP release at R anterior PR, OI, posterior fourchette.    PT Home Exercise Plan  diaphragmatic breathing, hip-flexor stretch, side-stretch, pelvic tilts in seated, TA in mod-quad, diaphragmatic breathing, supine butterfly, bow-and-arrow, posterior fourchette massage.    Consulted and Agree with Plan of Care  Patient       Patient will benefit from skilled therapeutic intervention in order to improve the following deficits and impairments:  Abnormal gait, Decreased balance, Increased muscle spasms, Decreased range of motion, Decreased scar mobility, Improper body mechanics, Decreased coordination, Decreased strength, Increased fascial restricitons, Impaired flexibility, Postural dysfunction, Pain  Visit Diagnosis: 1. Abnormal posture   2. Other muscle spasm        Problem List Patient Active Problem List   Diagnosis Date Noted  . Chronic UTI 12/06/2018  . Status post lumbar surgery 12/06/2018  . Spondylolisthesis, lumbar region 09/26/2018  . Aortic atherosclerosis ([place]) 08/31/2018  . CAD (coronary artery disease) 02/23/2018  . Fatigue 01/20/2018  . Short of breath on exertion 10/17/2017  . Advanced care planning/counseling discussion 03/28/2017  . Varicose veins of both lower extremities with complications 59/93/5701  . Arthritis 12/09/2016  . Vaginal atrophy 09/10/2015  . Menopausal hot flushes 09/10/2015  . Menopause 09/10/2015  . Onychomycosis due to dermatophyte 04/07/2015  . Bursitis of right shoulder 02/26/2015  . Environmental and seasonal allergies 02/26/2015  . Other allergic rhinitis 02/26/2015  . Osteoporosis 11/26/2014  . COPD (chronic obstructive pulmonary disease) ([place]) 11/26/2014  . Fuchs' corneal dystrophy 11/26/2014  . Essential (primary) hypertension 11/26/2014  . Hyperlipidemia 11/26/2014  . Chronic kidney disease, stage III (moderate) ([place]) 11/26/2014  . Hypothyroidism 11/26/2014  . Low back pain 11/26/2014  . Endothelial corneal dystrophy  11/26/2014   Willa Rough DPT, ATC Willa Rough 12/26/2018, 4:05 PM  [place] MAIN REH[facility] SERVICES [phone]  [place], [place], [address] Phone: [phone]   Fax:  [phone]  Name: Julia Deleon MRN: [id number] Date of Birth: Jan 19, 1946

## 2018-12-26 NOTE — Patient Instructions (Signed)
Intimate Rose internal trigger point release tool [phone]  Dr. Kathline Magic Premium Prostate Massager   [phone]

## 2018-12-29 ENCOUNTER — Other Ambulatory Visit: Payer: Self-pay

## 2018-12-29 ENCOUNTER — Ambulatory Visit: Payer: Medicare Other

## 2018-12-29 DIAGNOSIS — R293 Abnormal posture: Secondary | ICD-10-CM | POA: Diagnosis not present

## 2018-12-29 DIAGNOSIS — M62838 Other muscle spasm: Secondary | ICD-10-CM | POA: Diagnosis not present

## 2018-12-29 NOTE — Patient Instructions (Signed)
Urge supression technique:  1) Take a deep breath to convince yourself that you are in control and calm the nervous system.  2) Do 5 "quick-flick" kegels (pelvic floor muscle contractions) and re-assess the urge. Repeat another set if urge is still present. 3) Once the urge has decreased, start walking calmly to the the bathroom. Stop and repeat steps 1 and 2 as many times as needed until you can successfully get to the toilet. 4) Only once seated, take a deep breath and allow the pelvic floor muscles to relax and allow for the urine to flow.    Do not be discouraged if you are not successful the first couple times, this is normal and it will take practice but remember that YOU are in control. Start by practicing this at home where you do not have to worry as much if there were to be an accident. Allowing yourself to get rushed or nervous puts the bladder back in control and will not allow the technique to work.    When walking: draw up tall through the crown of your head, gently tuck the pelvis under, keep shoulder blades pulled back and down gently. As you step, keep the knee soft and land on your heel, rolling through to your toe. Keep your feet ~ 2-3 inches apart so you are not walking on a tightrope.   Take short, slow walks with purpose.   Swing the leg like you are going to kick a soccer ball with the inside of the foot then out to the point where the motion stops to help keep the tailbone mobile. Do ~ 15 or just enough to mobilize but not so much that your pain starts to increase.

## 2018-12-29 NOTE — Therapy (Signed)
[place] MAIN [facility] SERVICES [address] [place], [place], [address] Phone: [phone]   Fax:  [phone]  Physical Therapy Treatment  The patient has been informed of current processes in place at Outpatient Rehab to protect patients from Covid-19 exposure including social distancing, schedule modifications, and new cleaning procedures. After discussing their particular risk with a therapist based on the patient's personal risk factors, the patient has decided to proceed with in-person therapy.   Patient Details  Name: Julia Deleon MRN: [id number] Date of Birth: [birth year] Referring Provider (PT): Dr. Maryan Puls   Encounter Date: 12/29/2018  PT End of Session - 01/01/19 0902    Visit Number  11    Number of Visits  23    Date for PT Re-Evaluation  02/09/19    Authorization Type  Medicare    Authorization Time Period  through    Authorization - Visit Number  8    Authorization - Number of Visits  20    PT Start Time  0900    PT Stop Time  1000    PT Time Calculation (min)  60 min    Activity Tolerance  Patient tolerated treatment well;No increased pain    Behavior During Therapy  WFL for tasks assessed/performed       Past Medical History:  Diagnosis Date  . Arthritis   . COPD (chronic obstructive pulmonary disease) ([place])   . Family history of adverse reaction to anesthesia    father was slow to wake up  . Fuch's endothelial dystrophy   . Hyperlipidemia   . Hypothyroidism   . PONV (postoperative nausea and vomiting)    slow to wake up and PONV    Past Surgical History:  Procedure Laterality Date  . APPENDECTOMY    . BACK SURGERY  09/26/2018   L4-5 PLIF by Dr. Arnoldo Morale  . CARDIAC CATHETERIZATION    . CHOLECYSTECTOMY    . EYE SURGERY    . FOOT SURGERY Right    pin removed left  . laser vein surgery    . NASAL SINUS SURGERY    . RIGHT/LEFT HEART CATH AND CORONARY ANGIOGRAPHY N/A 12/06/2017   Procedure: RIGHT/LEFT HEART  CATH AND CORONARY ANGIOGRAPHY;  Surgeon: Yolonda Kida, MD;  Location: [place] [facility];  Service: Cardiovascular;  Laterality: N/A;  . TONSILLECTOMY      There were no vitals filed for this visit.      Pelvic Floor Physical Therapy Treatment Note  SCREENING  Changes in medications, allergies, or medical history?: none  SUBJECTIVE  Patient reports: Was doing well on Tuesday, drinking more water. Wed. And Thurs. Were not as good, was active in the morning and not drinking water.   Precautions:  Vaginal atrophy, sciatica, osteoporosis  Pain update:  Location of pain: back, LLE Current pain:  8/10  Max pain:  8/10 Least pain:  3/10  ** Pain 6/10 following treatment  Patient Goals: Improve bladder function, decrease back pain and increase strength and balance.   OBJECTIVE  Changes in:  Mobility/ROM: Decreased mobility through B sacral borders  Palpation: TTP to L Piriformis    INTERVENTIONS THIS SESSION: Manual: Performed TP release to R piriformis and grade 3-4 mobs to B sacral borders to decrease spasm and pain and allow for improved balance of musculature for improved function and decreased symptoms and to improve mobility of joint and surrounding connective tissue and decrease pressure on nerve roots for improved conductivity and function of  down-stream tissues.  Self-care: Educated on use of squatty-potty to improve emptying of bowel and bladder and urge-suppression technique to decrease feeling of urgency with BM Gait-training: Educated Pt. On how to use improved posture and gait mechanics when walking to decrease likelihood of pain and spasm returning. Emphasized and cued for engaging deep core, wider BOS, heel-toe pattern.  Total time: 60 min.                            PT Short Term Goals - 11/15/18 5170      PT SHORT TERM GOAL #1   Title  Patient will demonstrate improved pelvic alignment and balance of musculature  surrounding the pelvis to facilitate decreased PFM spasms and decrease pelvic pain.    Baseline  Pt. demonstrates severe posterior pelvic tilt, L thoracic/R lumbar scoliosis, and R anterior/L posterior pelvic obliquity.    Time  5    Period  Weeks    Status  New    Target Date  12/20/18      PT SHORT TERM GOAL #2   Title  Patient will demonstrate a coordinated contraction, relaxation, and bulge of the pelvic floor muscles to demonstrate functional recruitment and motion and allow for bladder emptying.    Baseline  Pt. is unable to fully empty bladder and describes pain with catheterization, demonstrating high tone of PFM.    Time  5    Period  Weeks    Status  New    Target Date  12/20/18      PT SHORT TERM GOAL #3   Title  Patient will demonstrate improved sitting and standing posture to demonstrate learning and decrease stress on the pelvic floor with functional activity.    Baseline  Pt. demonstrates severe posterior pelvic tilt, L thoracic/R lumbar scoliosis, and R anterior/L posterior pelvic obliquity.    Time  5    Period  Weeks    Status  New    Target Date  12/20/18        PT Long Term Goals - 11/28/18 1709      PT LONG TERM GOAL #1   Title  Patient will describe pain no greater than 3/10 during Sitting for 2 hours to demonstrate improved functional ability.    Baseline  Max 5/10, worst with prolonged sitting.    Time  10    Period  Weeks    Status  On-going    Target Date  02/09/19      PT LONG TERM GOAL #2   Title  Patient will score less than or equal to 20% on the Female NIH-CPSI to demonstrate a reduction in pain, urinary symptoms, and an improved quality of life.    Baseline  To be given at 2nd appt.    Time  10    Period  Weeks    Status  On-going    Target Date  02/09/19      PT LONG TERM GOAL #3   Title  Pt. Will be able to cath without pain and will be cleared by physiscian to d/c self-cath due to decreased residual volume with bladder emptying.     Baseline  required to cath 3 times per day, painful each time.    Time  10    Period  Weeks    Status  On-going    Target Date  02/09/19      PT LONG TERM GOAL #4   Title  Patient will score less than or equal to 40% on the Female NIH-CPSI and 30% on the [facility] to demonstrate a reduction in pain, urinary symptoms, and an improved quality of life.    Baseline  Female NIH-CPSI: 38/43 (88%) , PDI: 45/75 (60%)    Time  10    Period  Weeks    Status  New    Target Date  02/09/19            Plan - 01/01/19 0903    Clinical Impression Statement  Pt. Responded well to all interventions today, demonstrating improved gait mechanics, decreased pain and improved sacral mobility as well as understanding and correct performance of all education and exercises provided today. They will continue to benefit from skilled physical therapy to work toward remaining goals and maximize function as well as decrease likelihood of symptom increase or recurrence.     Personal Factors and Comorbidities  Age;Comorbidity 3+    Comorbidities  Osteoporosis, scoliosis, Arthritis, COPD, recent lumbosacral fusion for spondylolisthesis.    Examination-Activity Limitations  Toileting;Sit;Stand;Bend;Lift;Carry    Examination-Participation Restrictions  Interpersonal Relationship;Yard Work;Cleaning;Laundry    Stability/Clinical Decision Making  Unstable/Unpredictable    Rehab Potential  Good    PT Frequency  2x / week    PT Duration  Other (comment)   10 weeks   PT Treatment/Interventions  ADLs/Self Care Home Management;Aquatic Therapy;Moist Heat;Electrical Stimulation;Traction;Therapeutic activities;Functional mobility training;Stair training;Gait training;Therapeutic exercise;Balance training;Neuromuscular re-education;Patient/family education;Manual techniques;Dry needling;Passive range of motion;Scar mobilization;Taping    PT Next Visit Plan  GIVE TOOL INFO!! TPDN?, review pelvic tilts and TA in mod-quad, thoracic  mobility/towel? mobs to sacrum, MET correction etc.  Internal TP release at R anterior PR, OI, posterior fourchette.    PT Home Exercise Plan  diaphragmatic breathing, hip-flexor stretch, side-stretch, pelvic tilts in seated, TA in mod-quad, diaphragmatic breathing, supine butterfly, bow-and-arrow, posterior fourchette massage, urge-suppression, squatty potty    Consulted and Agree with Plan of Care  Patient       Patient will benefit from skilled therapeutic intervention in order to improve the following deficits and impairments:  Abnormal gait, Decreased balance, Increased muscle spasms, Decreased range of motion, Decreased scar mobility, Improper body mechanics, Decreased coordination, Decreased strength, Increased fascial restricitons, Impaired flexibility, Postural dysfunction, Pain  Visit Diagnosis: Abnormal posture  Other muscle spasm     Problem List Patient Active Problem List   Diagnosis Date Noted  . Chronic UTI 12/06/2018  . Status post lumbar surgery 12/06/2018  . Spondylolisthesis, lumbar region 09/26/2018  . Aortic atherosclerosis ([place]) 08/31/2018  . CAD (coronary artery disease) 02/23/2018  . Fatigue 01/20/2018  . Short of breath on exertion 10/17/2017  . Advanced care planning/counseling discussion 03/28/2017  . Varicose veins of both lower extremities with complications 38/25/0539  . Arthritis 12/09/2016  . Vaginal atrophy 09/10/2015  . Menopausal hot flushes 09/10/2015  . Menopause 09/10/2015  . Onychomycosis due to dermatophyte 04/07/2015  . Bursitis of right shoulder 02/26/2015  . Environmental and seasonal allergies 02/26/2015  . Other allergic rhinitis 02/26/2015  . Osteoporosis 11/26/2014  . COPD (chronic obstructive pulmonary disease) ([place]) 11/26/2014  . Fuchs' corneal dystrophy 11/26/2014  . Essential (primary) hypertension 11/26/2014  . Hyperlipidemia 11/26/2014  . Chronic kidney disease, stage III (moderate) ([place]) 11/26/2014  . Hypothyroidism  11/26/2014  . Low back pain 11/26/2014  . Endothelial corneal dystrophy 11/26/2014   Willa Rough DPT, ATC Willa Rough 01/01/2019, 9:06 AM  [place] MAIN [facility] SERVICES [address]  [place], [place], [address] Phone: [phone]   Fax:  [phone]  Name: Julia Deleon MRN: [id number] Date of Birth: Feb 08, 1946

## 2019-01-01 ENCOUNTER — Other Ambulatory Visit: Payer: Self-pay | Admitting: Family Medicine

## 2019-01-01 DIAGNOSIS — J301 Allergic rhinitis due to pollen: Secondary | ICD-10-CM | POA: Diagnosis not present

## 2019-01-01 NOTE — Telephone Encounter (Signed)
Requested medication (s) are due for refill today: yes  Requested medication (s) are on the active medication list: yes  Last refill: 09/07/2018  Future visit scheduled: yes  Notes to clinic:  This refill cannot be delegated   Requested Prescriptions  Pending Prescriptions Disp Refills   chlorzoxazone (PARAFON) 500 MG tablet [Pharmacy Med Name: CHLORZOXAZONE 500 MG TAB] 30 tablet 2    Sig: TAKE ONE TABLET FOUR TIMES DAILY AS NEEDED FOR MUSCLE SPASM     Not Delegated - Analgesics:  Muscle Relaxants Failed - 01/01/2019 10:22 AM      Failed - This refill cannot be delegated      Passed - Valid encounter within last 6 months    Recent Outpatient Visits          3 weeks ago Chronic UTI   [facility] Guadalupe Maple, MD   1 month ago Acute cystitis without hematuria   [facility] [place], Jeannette How, MD   2 months ago Varicose veins of both lower extremities with complications   [place], Jeannette How, MD   4 months ago Aortic atherosclerosis [facility])   [facility] Cr[place], Jeannette How, MD   9 months ago Other specified hypothyroidism   [place], Jeannette How, MD      Future Appointments            In 3 months [place], [place]

## 2019-01-02 ENCOUNTER — Other Ambulatory Visit: Payer: Self-pay

## 2019-01-02 ENCOUNTER — Ambulatory Visit: Payer: Medicare Other

## 2019-01-02 DIAGNOSIS — R293 Abnormal posture: Secondary | ICD-10-CM

## 2019-01-02 DIAGNOSIS — M62838 Other muscle spasm: Secondary | ICD-10-CM

## 2019-01-02 NOTE — Patient Instructions (Addendum)
 **  Try to sleep on your Right side as much as possible to help keep the muscles on your Left side longer and decrease pain.     This is The QL muscle  To perform release on this muscle, start by getting into this position by bridging the hips up and then slowly lowering your back, then your butt down to lengthen the low back then put the ball under you where you feel the tender spot and roll to the same side slightly to add pressure as needed. Hold still and take deep breaths until the pain is at least 50% less or, ideally, just pressure.   This is your piriformis    To release this muscle start in this position with your ankle crossed over the opposite knee. Place the tennis ball under your buttock where the tender spot is and then slightly roll your weight to the same side to put just enough pressure that it is uncomfortable. Hold and take deep breaths until the pain is at least 50% less or, ideally ,just pressure.   Int[facility] Rose internal trigger point release tool [phone]  Dr. Kathline Magic Premium Prostate Massager   [phone]

## 2019-01-02 NOTE — Therapy (Signed)
[place] MAIN [facility] SERVICES [address] [place], [place], [address] Phone: [phone]   Fax:  [phone]  Physical Therapy Treatment  The patient has been informed of current processes in place at Outpatient Rehab to protect patients from Covid-19 exposure including social distancing, schedule modifications, and new cleaning procedures. After discussing their particular risk with a therapist based on the patient's personal risk factors, the patient has decided to proceed with in-person therapy.   Patient Details  Name: Julia Deleon MRN: [id number] Date of Birth: 1945/07/11 Referring Provider (PT): Dr. Maryan Puls   Encounter Date: 01/02/2019  PT End of Session - 01/02/19 1655    Visit Number  12    Number of Visits  23    Date for PT Re-Evaluation  02/09/19    Authorization Type  Medicare    Authorization Time Period  through    Authorization - Visit Number  9    Authorization - Number of Visits  20    PT Start Time  7654    PT Stop Time  1640    PT Time Calculation (min)  60 min    Activity Tolerance  Patient tolerated treatment well;No increased pain    Behavior During Therapy  WFL for tasks assessed/performed       Past Medical History:  Diagnosis Date  . Arthritis   . COPD (chronic obstructive pulmonary disease) ([place])   . Family history of adverse reaction to anesthesia    father was slow to wake up  . Fuch's endothelial dystrophy   . Hyperlipidemia   . Hypothyroidism   . PONV (postoperative nausea and vomiting)    slow to wake up and PONV    Past Surgical History:  Procedure Laterality Date  . APPENDECTOMY    . BACK SURGERY  09/26/2018   L4-5 PLIF by Dr. Arnoldo Morale  . CARDIAC CATHETERIZATION    . CHOLECYSTECTOMY    . EYE SURGERY    . FOOT SURGERY Right    pin removed left  . laser vein surgery    . NASAL SINUS SURGERY    . RIGHT/LEFT HEART CATH AND CORONARY ANGIOGRAPHY N/A 12/06/2017   Procedure: RIGHT/LEFT HEART  CATH AND CORONARY ANGIOGRAPHY;  Surgeon: Yolonda Kida, MD;  Location: [place] [facility];  Service: Cardiovascular;  Laterality: N/A;  . TONSILLECTOMY      There were no vitals filed for this visit.   Pelvic Floor Physical Therapy Treatment Note  SCREENING  Changes in medications, allergies, or medical history?: none  SUBJECTIVE  Patient reports: She is urinating more often, is seeing some urine output during lunch now, is drinking more water during that time. Decreased pain lasted because she did not go for a walk. When she went for a walk yesterday she went ~ 1/3 of the distance and noticed that she felt tired earlier. Is stepping back and building back up correctly. Felt ok after walk, only hurt worse upon waking today.  Sleeps on L side most often  Precautions:  Vaginal atrophy, sciatica, osteoporosis  Pain update:  Location of pain: back, LLE Current pain:  6/10  Max pain:  8/10 Least pain:  3/10  ** Pain 3/10 following treatment  Patient Goals: Improve bladder function, decrease back pain and increase strength and balance.   OBJECTIVE  Changes in:  Palpation: TTP to L QL and Piriformis.  Pelvic floor: TTP to to R anterior PR, OI, and coccygeus  INTERVENTIONS THIS SESSION: Manual: Performed  TP release to R anterior PR, OI, and coccygeus internally  to decrease spasm and pain and allow for improved balance of musculature for improved function. Performed scar mobilization through ~ 7 o'clock region of pereneum to decrease tension on nerve bundle and allow for improved PFM relaxation for improved bladder emptying. Theract: Educated on how to find self internal TP release tool and how to use it and expanded on doing self scar release as part of "thumb work" Educated on and practiced how to do self TP release with tennis ball to QL and piriformis for spasm reduction between session. Educated Pt. On sleeping on right side as much as possible to help prevent L QL  from shortening and causing increased pain.   Total time: 60 min.                              PT Short Term Goals - 11/15/18 5409      PT SHORT TERM GOAL #1   Title  Patient will demonstrate improved pelvic alignment and balance of musculature surrounding the pelvis to facilitate decreased PFM spasms and decrease pelvic pain.    Baseline  Pt. demonstrates severe posterior pelvic tilt, L thoracic/R lumbar scoliosis, and R anterior/L posterior pelvic obliquity.    Time  5    Period  Weeks    Status  New    Target Date  12/20/18      PT SHORT TERM GOAL #2   Title  Patient will demonstrate a coordinated contraction, relaxation, and bulge of the pelvic floor muscles to demonstrate functional recruitment and motion and allow for bladder emptying.    Baseline  Pt. is unable to fully empty bladder and describes pain with catheterization, demonstrating high tone of PFM.    Time  5    Period  Weeks    Status  New    Target Date  12/20/18      PT SHORT TERM GOAL #3   Title  Patient will demonstrate improved sitting and standing posture to demonstrate learning and decrease stress on the pelvic floor with functional activity.    Baseline  Pt. demonstrates severe posterior pelvic tilt, L thoracic/R lumbar scoliosis, and R anterior/L posterior pelvic obliquity.    Time  5    Period  Weeks    Status  New    Target Date  12/20/18        PT Long Term Goals - 11/28/18 1709      PT LONG TERM GOAL #1   Title  Patient will describe pain no greater than 3/10 during Sitting for 2 hours to demonstrate improved functional ability.    Baseline  Max 5/10, worst with prolonged sitting.    Time  10    Period  Weeks    Status  On-going    Target Date  02/09/19      PT LONG TERM GOAL #2   Title  Patient will score less than or equal to 20% on the Female NIH-CPSI to demonstrate a reduction in pain, urinary symptoms, and an improved quality of life.    Baseline  To be given  at 2nd appt.    Time  10    Period  Weeks    Status  On-going    Target Date  02/09/19      PT LONG TERM GOAL #3   Title  Pt. Will be able to cath without pain and will  be cleared by physiscian to d/c self-cath due to decreased residual volume with bladder emptying.    Baseline  required to cath 3 times per day, painful each time.    Time  10    Period  Weeks    Status  On-going    Target Date  02/09/19      PT LONG TERM GOAL #4   Title  Patient will score less than or equal to 40% on the Female NIH-CPSI and 30% on the [facility] to demonstrate a reduction in pain, urinary symptoms, and an improved quality of life.    Baseline  Female NIH-CPSI: 38/43 (88%) , PDI: 45/75 (60%)    Time  10    Period  Weeks    Status  New    Target Date  02/09/19            Plan - 01/02/19 1656    Clinical Impression Statement  Pt. Responded well to all interventions today, demonstrating improved scar mobility and decreased spasm internally as well as understanding and correct performance of all education and exercises provided today. They will continue to benefit from skilled physical therapy to work toward remaining goals and maximize function as well as decrease likelihood of symptom increase or recurrence.     Personal Factors and Comorbidities  Age;Comorbidity 3+    Comorbidities  Osteoporosis, scoliosis, Arthritis, COPD, recent lumbosacral fusion for spondylolisthesis.    Examination-Activity Limitations  Toileting;Sit;Stand;Bend;Lift;Carry    Examination-Participation Restrictions  Interpersonal Relationship;Yard Work;Cleaning;Laundry    Stability/Clinical Decision Making  Unstable/Unpredictable    Rehab Potential  Good    PT Frequency  2x / week    PT Duration  Other (comment)   10 weeks   PT Treatment/Interventions  ADLs/Self Care Home Management;Aquatic Therapy;Moist Heat;Electrical Stimulation;Traction;Therapeutic activities;Functional mobility training;Stair training;Gait  training;Therapeutic exercise;Balance training;Neuromuscular re-education;Patient/family education;Manual techniques;Dry needling;Passive range of motion;Scar mobilization;Taping    PT Next Visit Plan  TPDN?, review pelvic tilts and TA in mod-quad, thoracic mobility/towel? MET correction etc.  Internal TP release PRN, scar mobility at 7 o'clock.    PT Home Exercise Plan  diaphragmatic breathing, hip-flexor stretch, side-stretch, pelvic tilts in seated, TA in mod-quad, diaphragmatic breathing, supine butterfly, bow-and-arrow, posterior fourchette massage, urge-suppression, squatty potty    Consulted and Agree with Plan of Care  Patient       Patient will benefit from skilled therapeutic intervention in order to improve the following deficits and impairments:  Abnormal gait, Decreased balance, Increased muscle spasms, Decreased range of motion, Decreased scar mobility, Improper body mechanics, Decreased coordination, Decreased strength, Increased fascial restricitons, Impaired flexibility, Postural dysfunction, Pain  Visit Diagnosis: Abnormal posture  Other muscle spasm     Problem List Patient Active Problem List   Diagnosis Date Noted  . Chronic UTI 12/06/2018  . Status post lumbar surgery 12/06/2018  . Spondylolisthesis, lumbar region 09/26/2018  . Aortic atherosclerosis ([place]) 08/31/2018  . CAD (coronary artery disease) 02/23/2018  . Fatigue 01/20/2018  . Short of breath on exertion 10/17/2017  . Advanced care planning/counseling discussion 03/28/2017  . Varicose veins of both lower extremities with complications 17/49/4496  . Arthritis 12/09/2016  . Vaginal atrophy 09/10/2015  . Menopausal hot flushes 09/10/2015  . Menopause 09/10/2015  . Onychomycosis due to dermatophyte 04/07/2015  . Bursitis of right shoulder 02/26/2015  . Environmental and seasonal allergies 02/26/2015  . Other allergic rhinitis 02/26/2015  . Osteoporosis 11/26/2014  . COPD (chronic obstructive pulmonary  disease) ([place]) 11/26/2014  . Fuchs' corneal dystrophy 11/26/2014  .  Essential (primary) hypertension 11/26/2014  . Hyperlipidemia 11/26/2014  . Chronic kidney disease, stage III (moderate) ([place]) 11/26/2014  . Hypothyroidism 11/26/2014  . Low back pain 11/26/2014  . Endothelial corneal dystrophy 11/26/2014   Willa Rough DPT, ATC Willa Rough 01/02/2019, 4:59 PM  [place] MAIN [facility] SERVICES [address] [place], [place], [address] Phone: [phone]   Fax:  [phone]  Name: Julia Deleon MRN: [id number] Date of Birth: 03-25-1946

## 2019-01-04 ENCOUNTER — Other Ambulatory Visit: Payer: Self-pay

## 2019-01-04 ENCOUNTER — Ambulatory Visit: Payer: Medicare Other

## 2019-01-04 DIAGNOSIS — M62838 Other muscle spasm: Secondary | ICD-10-CM | POA: Diagnosis not present

## 2019-01-04 DIAGNOSIS — R293 Abnormal posture: Secondary | ICD-10-CM

## 2019-01-04 NOTE — Therapy (Signed)
[place] MAIN [facility] SERVICES [address] [place], [place], [address] Phone: [phone]   Fax:  [phone]  Physical Therapy Progress Note  Dates of reporting period  11/14/2018   to   01/04/2019   The patient has been informed of current processes in place at Outpatient Rehab to protect patients from Covid-19 exposure including social distancing, schedule modifications, and new cleaning procedures. After discussing their particular risk with a therapist based on the patient's personal risk factors, the patient has decided to proceed with in-person therapy.     Patient Details  Name: Julia Deleon [place] MRN: [id number] Date of Birth: 1945-09-10 Referring Provider (PT): Dr. Maryan Puls   Encounter Date: 01/04/2019  PT End of Session - 01/04/19 1455    Visit Number  13    Number of Visits  23    Date for PT Re-Evaluation  02/09/19    Authorization Type  Medicare    Authorization Time Period  through 02/09/19    Authorization - Visit Number  10    Authorization - Number of Visits  20    PT Start Time  1300    PT Stop Time  1410    PT Time Calculation (min)  70 min    Activity Tolerance  Patient tolerated treatment well;No increased pain    Behavior During Therapy  WFL for tasks assessed/performed       Past Medical History:  Diagnosis Date  . Arthritis   . COPD (chronic obstructive pulmonary disease) ([place])   . Family history of adverse reaction to anesthesia    father was slow to wake up  . Fuch's endothelial dystrophy   . Hyperlipidemia   . Hypothyroidism   . PONV (postoperative nausea and vomiting)    slow to wake up and PONV    Past Surgical History:  Procedure Laterality Date  . APPENDECTOMY    . BACK SURGERY  09/26/2018   L4-5 PLIF by Dr. Arnoldo Morale  . CARDIAC CATHETERIZATION    . CHOLECYSTECTOMY    . EYE SURGERY    . FOOT SURGERY Right    pin removed left  . laser vein surgery    . NASAL SINUS SURGERY    . RIGHT/LEFT HEART  CATH AND CORONARY ANGIOGRAPHY N/A 12/06/2017   Procedure: RIGHT/LEFT HEART CATH AND CORONARY ANGIOGRAPHY;  Surgeon: Yolonda Kida, MD;  Location: [place] [facility];  Service: Cardiovascular;  Laterality: N/A;  . TONSILLECTOMY      There were no vitals filed for this visit.    Pelvic Floor Physical Therapy Treatment and Progress Note  SCREENING  Changes in medications, allergies, or medical history?: none  SUBJECTIVE  Patient reports: She feels that things are slowly improving, she has had a little bit more urine output at lunch time before catheterizing. She does not walk to the point of increased pain any more but feels tired after walking. Her pain "was up" this morning when she got up and got moving this morning so she took an Advil. Slept on her R side for ~ 3/4 of the night before she rolled over the last 2 nights. Went and got the self internal release tool at [employer] and has tried with her thumb but is not finding the right spot.  Precautions:  Vaginal atrophy, sciatica, osteoporosis  Pain update:  Location of pain: back, LLE Current pain:  6/10  Max pain:  8/10 Least pain:  3/10  ** Pain 4-5/10 following treatment  Patient  Goals: Improve bladder function, decrease back pain and increase strength and balance.   OBJECTIVE  Changes in:  Observation/posture: Pt. Is walking with ~ 80% improvement in upright posture from forward lean that she presented with. Her L innominate is rotated forward slightly but to a lesser degree than previously observed.   ROM/Mobility: Pt. Demonstrates improved thoracolumbar rotation B by ~ 30%  L knee is ~ 10 degrees from full extension actively, 5 degrees with overpressure (painful).  R knee ~ 5 degrees from full extension.  Strength: Pt demonstrates difficulty recruiting L quads for terminal extension, contributing to abnormality in gait pattern and imbalance surrounding pelvis.  Pelvic Floor: Pt. Continues to  demonstrate some internal PFM spasms with less tenderness and shorter duration to release. Has scar tissue at 7 o'clock angle that recreates sensation she has had in bladder from when she first had any difficulty emptying following the delivery of her first child.  Palpation: TTP to L semitendinosus, semimembranosus, and vastus lateralis   INTERVENTIONS THIS SESSION: Manual: Performed TP release to L semitendinosus, semimembranosus, and vastus lateralis to decrease spasm and pain and allow for improved balance of musculature for improved function and decreased symptoms. Dry-needle: Performed TPDN with a .30x80m needle to L semitendinosus, semimembranosus, and vastus lateralis as described below to decrease spasm and pain and allow for improved balance of musculature for improved function and decreased symptoms. Therex: Educated on and practiced supine extension over towel-roll for gentle thoracic mobility into decreased kyphosis and L quad sets to allow for improved gait mechanics and decrease imbalance around pelvis to decrease pain and spasm and allow for bladder emptying without catheter.   Total time: 70 min.                      Trigger Point Dry Needling - 01/04/19 0001    Consent Given?  Yes    Education Handout Provided  No    Muscles Treated Lower Quadrant  Vastus lateralis;Hamstring    Dry Needling Comments  Left    Vastus lateralis Response  Twitch response elicited;Palpable increased muscle length    Hamstring Response  Twitch response elicited;Palpable increased muscle length             PT Short Term Goals - 01/04/19 1522      PT SHORT TERM GOAL #1   Title  Patient will demonstrate improved pelvic alignment and balance of musculature surrounding the pelvis to facilitate decreased PFM spasms and decrease pelvic pain.    Baseline  Pt. demonstrates severe posterior pelvic tilt, L thoracic/R lumbar scoliosis, and R anterior/L posterior pelvic  obliquity.    Time  5    Period  Weeks    Status  Partially Met    Target Date  12/20/18      PT SHORT TERM GOAL #2   Title  Patient will demonstrate a coordinated contraction, relaxation, and bulge of the pelvic floor muscles to demonstrate functional recruitment and motion and allow for bladder emptying.    Baseline  Pt. is unable to fully empty bladder and describes pain with catheterization, demonstrating high tone of PFM.    Time  5    Period  Weeks    Status  Achieved    Target Date  12/20/18      PT SHORT TERM GOAL #3   Title  Patient will demonstrate improved sitting and standing posture to demonstrate learning and decrease stress on the pelvic floor with functional activity.  Baseline  Pt. demonstrates severe posterior pelvic tilt, L thoracic/R lumbar scoliosis, and R anterior/L posterior pelvic obliquity.    Time  5    Period  Weeks    Status  Partially Met    Target Date  12/20/18        PT Long Term Goals - 01/04/19 1523      PT LONG TERM GOAL #1   Title  Patient will describe pain no greater than 3/10 during Sitting for 2 hours to demonstrate improved functional ability.    Baseline  Max 9/10, worst with prolonged sitting. (Pt. ignoring pain at initial eval)    Time  10    Period  Weeks    Status  On-going    Target Date  02/09/19      PT LONG TERM GOAL #2   Title  Pt. will report walking for 30 min. without an increase in pain >2/10 to allow for return to exercise and demonstrate improved function.    Baseline  walking ~ 10 min with pain ~ 6/10    Time  10    Period  Weeks    Status  On-going    Target Date  02/09/19      PT LONG TERM GOAL #3   Title  Pt. Will be able to cath without pain and will be cleared by physiscian to d/Deleon self-cath due to decreased residual volume with bladder emptying.    Baseline  required to cath 3 times per day, painful each time.    Time  10    Period  Weeks    Status  On-going    Target Date  02/09/19      PT LONG TERM  GOAL #4   Title  Patient will score less than or equal to 40% on the Female NIH-CPSI and 30% on the [facility] to demonstrate a reduction in pain, urinary symptoms, and an improved quality of life.    Baseline  Female NIH-CPSI: 38/43 (88%) , PDI: 45/75 (60%)    Time  10    Period  Weeks    Status  New    Target Date  02/09/19            Plan - 01/04/19 1517    Clinical Impression Statement  Pt. Is progressing slowly but surely and making strides toward her goals of decreased pain and improved ability to empty the bladder. She is now emptying more frequently and slightly larger amounts, though she continues to need to self catheterize 3 times per day. Her pain decreases with intervention but increases after sleeping or walking long distances due to complexity of muscular imbalance and scoliosis. Her posture is ~ 80% more upright than at initial eval and Pt. is taking more time to focus on it with AMB, leading to her being able to walk short distances for exercise without increased pain. Continue per POC.    Personal Factors and Comorbidities  Age;Comorbidity 3+    Comorbidities  Osteoporosis, scoliosis, Arthritis, COPD, recent lumbosacral fusion for spondylolisthesis.    Examination-Activity Limitations  Toileting;Sit;Stand;Bend;Lift;Carry    Examination-Participation Restrictions  Interpersonal Relationship;Yard Work;Cleaning;Laundry    Stability/Clinical Decision Making  Unstable/Unpredictable    Clinical Decision Making  High    Rehab Potential  Good    PT Frequency  2x / week    PT Duration  Other (comment)   10 weeks   PT Treatment/Interventions  ADLs/Self Care Home Management;Aquatic Therapy;Moist Heat;Electrical Stimulation;Traction;Therapeutic activities;Functional mobility training;Stair training;Gait training;Therapeutic exercise;Balance  training;Neuromuscular re-education;Patient/family education;Manual techniques;Dry needling;Passive range of motion;Scar mobilization;Taping    PT Next  Visit Plan  TPDN?, review pelvic tilts and TA in mod-quad, MET correction etc.  Internal TP release PRN, scar mobility at 7 o'clock.    PT Home Exercise Plan  diaphragmatic breathing, hip-flexor stretch, side-stretch, pelvic tilts in seated, TA in mod-quad, diaphragmatic breathing, supine butterfly, bow-and-arrow, posterior fourchette massage, urge-suppression, squatty potty    Consulted and Agree with Plan of Care  Patient       Patient will benefit from skilled therapeutic intervention in order to improve the following deficits and impairments:  Abnormal gait, Decreased balance, Increased muscle spasms, Decreased range of motion, Decreased scar mobility, Improper body mechanics, Decreased coordination, Decreased strength, Increased fascial restricitons, Impaired flexibility, Postural dysfunction, Pain  Visit Diagnosis: Abnormal posture  Other muscle spasm     Problem List Patient Active Problem List   Diagnosis Date Noted  . Chronic UTI 12/06/2018  . Status post lumbar surgery 12/06/2018  . Spondylolisthesis, lumbar region 09/26/2018  . Aortic atherosclerosis ([place]) 08/31/2018  . CAD (coronary artery disease) 02/23/2018  . Fatigue 01/20/2018  . Short of breath on exertion 10/17/2017  . Advanced care planning/counseling discussion 03/28/2017  . Varicose veins of both lower extremities with complications 44/81/8563  . Arthritis 12/09/2016  . Vaginal atrophy 09/10/2015  . Menopausal hot flushes 09/10/2015  . Menopause 09/10/2015  . Onychomycosis due to dermatophyte 04/07/2015  . Bursitis of right shoulder 02/26/2015  . Environmental and seasonal allergies 02/26/2015  . Other allergic rhinitis 02/26/2015  . Osteoporosis 11/26/2014  . COPD (chronic obstructive pulmonary disease) ([place]) 11/26/2014  . Fuchs' corneal dystrophy 11/26/2014  . Essential (primary) hypertension 11/26/2014  . Hyperlipidemia 11/26/2014  . Chronic kidney disease, stage III (moderate) ([place]) 11/26/2014  .  Hypothyroidism 11/26/2014  . Low back pain 11/26/2014  . Endothelial corneal dystrophy 11/26/2014   Willa Rough DPT, ATC Willa Rough 01/04/2019, 3:28 PM  [place] MAIN [facility] SERVICES [address] [place], [place], [address] Phone: [phone]   Fax:  [phone]  Name: Maranatha Deleon Slavick MRN: [id number] Date of Birth: 04-03-1946

## 2019-01-04 NOTE — Patient Instructions (Addendum)
Self Internal Trigger Point Relief    1) Wash your hands and prop yourself up in a way where you can easily reach the vagina. You may wish to have a small hand-held mirror near by.  2) lubricate the tool and vaginal opening using a hypoallergenic lubricant such as "slippery-stuff".   3) Slowly and gently insert the tool into the vagina using deep breaths to allow relaxation of the muscles around the tool.  4) Avoiding the "12 o-clock" region near the urethra, gently use the handle of the tool like a lever to press the angled tip of the tool onto the wall of the pelvic floor.   5) Move the tool to different areas of the pelvic floor and feel for areas that are tender called "trigger points". When you find one hold the tool still, applying just enough pressure to elicit mild discomfort and take deep belly breaths until the discomfort subsides or decreases by at least 50%.   6) Repeat the process for any trigger points you find spending between 3-10 minutes on this per night until you do not find any more trigger points or you are told otherwise by your therapist..   Press the knee into a towel and hold for 5 seconds. Release, repeat 10x2.   Make a towel roll and place it under your upper back (the part that curves out.) laying on a firm surface. Take deep breaths for ~ 1 min, then move it to a spot a little higher or lower and repeat in each spot (~3). If this becomes easy (no discomfort) start slowly moving arms straight up to the ears and back to the hips to change the pressure.

## 2019-01-08 ENCOUNTER — Other Ambulatory Visit: Payer: Self-pay | Admitting: Family Medicine

## 2019-01-08 NOTE — Telephone Encounter (Signed)
Routing to provider  

## 2019-01-08 NOTE — Telephone Encounter (Signed)
Requested medication (s) are due for refill today: yes  Requested medication (s) are on the active medication list:yes  Last refill:  11/06/2018  Future visit scheduled:no  Notes to clinic: DUE TO [place], WE WILL [place]. THANKS    Requested Prescriptions  Pending Prescriptions Disp Refills   levothyroxine (SYNTHROID) 75 MCG tablet [Pharmacy Med Name: LEVOTHYROXINE SODIUM 75 MCG TAB] 90 tablet 3    Sig: TAKE ONE TABLET EACH MORNING BEFORE BREAKFAST     Endocrinology:  Hypothyroid Agents Failed - 01/08/2019  3:27 PM      Failed - TSH needs to be rechecked within 3 months after an abnormal result. Refill until TSH is due.      Failed - TSH in normal range and within 360 days    TSH  Date Value Ref Range Status  10/17/2017 3.210 0.450 - 4.500 uIU/mL Final         Passed - Valid encounter within last 12 months    Recent Outpatient Visits          1 month ago Chronic UTI   [facility] [place], Jeannette How, MD   2 months ago Acute cystitis without hematuria   [facility] Guadalupe Maple, MD   2 months ago Varicose veins of both lower extremities with complications   [place], Jeannette How, MD   4 months ago Aortic atherosclerosis [facility])   [facility] [place], Jeannette How, MD   9 months ago Other specified hypothyroidism   [facility] [place], Jeannette How, MD      Future Appointments            In 2 months [place], [place]

## 2019-01-09 ENCOUNTER — Ambulatory Visit: Payer: Medicare Other | Attending: Urology

## 2019-01-09 DIAGNOSIS — M62838 Other muscle spasm: Secondary | ICD-10-CM | POA: Insufficient documentation

## 2019-01-09 DIAGNOSIS — R293 Abnormal posture: Secondary | ICD-10-CM | POA: Diagnosis not present

## 2019-01-09 NOTE — Patient Instructions (Signed)
CHEST: Doorway, Bilateral - Standing   Both arms if narrow door                         Standing in doorway, place hands and elbows on wall with elbows bent at shoulder height, shoulders down away from ears. Lean forward. Hold for_5_ breaths, repeat 2-3 times, 1-2 times per day.    Unilateral option with head turn Standing in doorway, place hands on wall with elbows bent at shoulder height. Lean forward. Feel stretch through the chest and neck.  Hold for_5_ breaths, repeat 2-3 times, 1-2 times per day.

## 2019-01-09 NOTE — Therapy (Signed)
[place] MAIN [facility] SERVICES [address] [place], [place], [address] Phone: [phone]   Fax:  [phone]  Physical Therapy Treatment  The patient has been informed of current processes in place at Outpatient Rehab to protect patients from Covid-19 exposure including social distancing, schedule modifications, and new cleaning procedures. After discussing their particular risk with a therapist based on the patient's personal risk factors, the patient has decided to proceed with in-person therapy.   Patient Details  Name: Julia Deleon MRN: [id number] Date of Birth: [birth year] Referring Provider (PT): Dr. Maryan Puls   Encounter Date: 01/09/2019  PT End of Session - 01/09/19 1656    Visit Number  14    Number of Visits  23    Date for PT Re-Evaluation  02/09/19    Authorization Type  Medicare    Authorization Time Period  through 02/09/19    Authorization - Visit Number  11    Authorization - Number of Visits  20    PT Start Time  3299    PT Stop Time  1630    PT Time Calculation (min)  60 min    Activity Tolerance  Patient tolerated treatment well;No increased pain    Behavior During Therapy  WFL for tasks assessed/performed       Past Medical History:  Diagnosis Date  . Arthritis   . COPD (chronic obstructive pulmonary disease) ([place])   . Family history of adverse reaction to anesthesia    father was slow to wake up  . Fuch's endothelial dystrophy   . Hyperlipidemia   . Hypothyroidism   . PONV (postoperative nausea and vomiting)    slow to wake up and PONV    Past Surgical History:  Procedure Laterality Date  . APPENDECTOMY    . BACK SURGERY  09/26/2018   L4-5 PLIF by Dr. Arnoldo Morale  . CARDIAC CATHETERIZATION    . CHOLECYSTECTOMY    . EYE SURGERY    . FOOT SURGERY Right    pin removed left  . laser vein surgery    . NASAL SINUS SURGERY    . RIGHT/LEFT HEART CATH AND CORONARY ANGIOGRAPHY N/A 12/06/2017   Procedure:  RIGHT/LEFT HEART CATH AND CORONARY ANGIOGRAPHY;  Surgeon: Yolonda Kida, MD;  Location: [place] [facility];  Service: Cardiovascular;  Laterality: N/A;  . TONSILLECTOMY      There were no vitals filed for this visit.     Pelvic Floor Physical Therapy Treatment and Progress Note  SCREENING  Changes in medications, allergies, or medical history?: none  SUBJECTIVE  Patient reports: She feels that whatever we did last time helped the pain in the L thigh/knee a lot and it stuck. She has upped her muscle relaxer to 1/2 a tablet and urinated better at lunch, still not as much as she gets in the early morning or at night.    Precautions:  Vaginal atrophy, sciatica, osteoporosis  Pain update:  Location of pain: back, LLE Current pain:  6/10  Max pain:  8/10 Least pain:  3/10  ** Pain 4-5/10 following treatment  Patient Goals: Improve bladder function, decrease back pain and increase strength and balance.   OBJECTIVE  Changes in:  Observation/posture: Pt. Is wincing less today than at previous sessions when standing from sitting.  ROM/Mobility: Pt. Is limited in rotation exercise by tightness in R shoulder (Hx. Of frozen shoulder).   Pelvic Floor: TTP to Anterior and posterior PR and coccygeus B  INTERVENTIONS  THIS SESSION: Manual: Performed TP release to L semitendinosus, semimembranosus, and vastus lateralis to decrease spasm and pain and allow for improved balance of musculature for improved function and decreased symptoms. Dry-needle: Performed TPDN with a .30x76m needle to L semitendinosus, semimembranosus, and vastus lateralis as described below to decrease spasm and pain and allow for improved balance of musculature for improved function and decreased symptoms. Therex: Educated on and practiced supine extension over towel-roll for gentle thoracic mobility into decreased kyphosis and L quad sets to allow for improved gait mechanics and decrease imbalance around  pelvis to decrease pain and spasm and allow for bladder emptying without catheter.   Total time: 60 min.                                PT Short Term Goals - 01/04/19 1522      PT SHORT TERM GOAL #1   Title  Patient will demonstrate improved pelvic alignment and balance of musculature surrounding the pelvis to facilitate decreased PFM spasms and decrease pelvic pain.    Baseline  Pt. demonstrates severe posterior pelvic tilt, L thoracic/R lumbar scoliosis, and R anterior/L posterior pelvic obliquity.    Time  5    Period  Weeks    Status  Partially Met    Target Date  12/20/18      PT SHORT TERM GOAL #2   Title  Patient will demonstrate a coordinated contraction, relaxation, and bulge of the pelvic floor muscles to demonstrate functional recruitment and motion and allow for bladder emptying.    Baseline  Pt. is unable to fully empty bladder and describes pain with catheterization, demonstrating high tone of PFM.    Time  5    Period  Weeks    Status  Achieved    Target Date  12/20/18      PT SHORT TERM GOAL #3   Title  Patient will demonstrate improved sitting and standing posture to demonstrate learning and decrease stress on the pelvic floor with functional activity.    Baseline  Pt. demonstrates severe posterior pelvic tilt, L thoracic/R lumbar scoliosis, and R anterior/L posterior pelvic obliquity.    Time  5    Period  Weeks    Status  Partially Met    Target Date  12/20/18        PT Long Term Goals - 01/04/19 1523      PT LONG TERM GOAL #1   Title  Patient will describe pain no greater than 3/10 during Sitting for 2 hours to demonstrate improved functional ability.    Baseline  Max 9/10, worst with prolonged sitting. (Pt. ignoring pain at initial eval)    Time  10    Period  Weeks    Status  On-going    Target Date  02/09/19      PT LONG TERM GOAL #2   Title  Pt. will report walking for 30 min. without an increase in pain >2/10 to allow  for return to exercise and demonstrate improved function.    Baseline  walking ~ 10 min with pain ~ 6/10    Time  10    Period  Weeks    Status  On-going    Target Date  02/09/19      PT LONG TERM GOAL #3   Title  Pt. Will be able to cath without pain and will be cleared by physiscian to d/c self-cath  due to decreased residual volume with bladder emptying.    Baseline  required to cath 3 times per day, painful each time.    Time  10    Period  Weeks    Status  On-going    Target Date  02/09/19      PT LONG TERM GOAL #4   Title  Patient will score less than or equal to 40% on the Female NIH-CPSI and 30% on the [facility] to demonstrate a reduction in pain, urinary symptoms, and an improved quality of life.    Baseline  Female NIH-CPSI: 38/43 (88%) , PDI: 45/75 (60%)    Time  10    Period  Weeks    Status  New    Target Date  02/09/19            Plan - 01/09/19 1656    Clinical Impression Statement  Pt. Responded well to all interventions today, demonstrating improved pain between sessions and decreased spasms within session as well as understanding and correct performance of all education and exercises provided today. They will continue to benefit from skilled physical therapy to work toward remaining goals and maximize function as well as decrease likelihood of symptom increase or recurrence.     Personal Factors and Comorbidities  Age;Comorbidity 3+    Comorbidities  Osteoporosis, scoliosis, Arthritis, COPD, recent lumbosacral fusion for spondylolisthesis.    Examination-Activity Limitations  Toileting;Sit;Stand;Bend;Lift;Carry    Examination-Participation Restrictions  Interpersonal Relationship;Yard Work;Cleaning;Laundry    Stability/Clinical Decision Making  Unstable/Unpredictable    Rehab Potential  Good    PT Frequency  2x / week    PT Duration  Other (comment)   10 weeks   PT Treatment/Interventions  ADLs/Self Care Home Management;Aquatic Therapy;Moist Heat;Electrical  Stimulation;Traction;Therapeutic activities;Functional mobility training;Stair training;Gait training;Therapeutic exercise;Balance training;Neuromuscular re-education;Patient/family education;Manual techniques;Dry needling;Passive range of motion;Scar mobilization;Taping    PT Next Visit Plan  TPDN to L hamstrings (prone near membranosis) hip flexor PRN, back, review pelvic tilts, teapot,  and TA in mod-quad, MET correction etc.  Internal TP release PRN, scar mobility at 7 o'clock.    PT Home Exercise Plan  diaphragmatic breathing, hip-flexor stretch, side-stretch, pelvic tilts in seated, TA in mod-quad, diaphragmatic breathing, supine butterfly, bow-and-arrow, posterior fourchette massage, urge-suppression, squatty potty, doorway chest-stretch    Consulted and Agree with Plan of Care  Patient       Patient will benefit from skilled therapeutic intervention in order to improve the following deficits and impairments:  Abnormal gait, Decreased balance, Increased muscle spasms, Decreased range of motion, Decreased scar mobility, Improper body mechanics, Decreased coordination, Decreased strength, Increased fascial restricitons, Impaired flexibility, Postural dysfunction, Pain  Visit Diagnosis: Abnormal posture  Other muscle spasm     Problem List Patient Active Problem List   Diagnosis Date Noted  . Chronic UTI 12/06/2018  . Status post lumbar surgery 12/06/2018  . Spondylolisthesis, lumbar region 09/26/2018  . Aortic atherosclerosis ([place]) 08/31/2018  . CAD (coronary artery disease) 02/23/2018  . Fatigue 01/20/2018  . Short of breath on exertion 10/17/2017  . Advanced care planning/counseling discussion 03/28/2017  . Varicose veins of both lower extremities with complications 97/35/3299  . Arthritis 12/09/2016  . Vaginal atrophy 09/10/2015  . Menopausal hot flushes 09/10/2015  . Menopause 09/10/2015  . Onychomycosis due to dermatophyte 04/07/2015  . Bursitis of right shoulder  02/26/2015  . Environmental and seasonal allergies 02/26/2015  . Other allergic rhinitis 02/26/2015  . Osteoporosis 11/26/2014  . COPD (chronic obstructive pulmonary disease) ([place]) 11/26/2014  .  Fuchs' corneal dystrophy 11/26/2014  . Essential (primary) hypertension 11/26/2014  . Hyperlipidemia 11/26/2014  . Chronic kidney disease, stage III (moderate) ([place]) 11/26/2014  . Hypothyroidism 11/26/2014  . Low back pain 11/26/2014  . Endothelial corneal dystrophy 11/26/2014   Willa Rough DPT, ATC Willa Rough 01/09/2019, 5:04 PM  [place] MAIN [facility] SERVICES [address] [place], [place], [address] Phone: [phone]   Fax:  [phone]  Name: Julia Deleon MRN: [id number] Date of Birth: 1945-07-25

## 2019-01-11 DIAGNOSIS — J301 Allergic rhinitis due to pollen: Secondary | ICD-10-CM | POA: Diagnosis not present

## 2019-01-12 ENCOUNTER — Ambulatory Visit: Payer: Medicare Other

## 2019-01-12 ENCOUNTER — Other Ambulatory Visit: Payer: Self-pay

## 2019-01-12 DIAGNOSIS — R293 Abnormal posture: Secondary | ICD-10-CM | POA: Diagnosis not present

## 2019-01-12 DIAGNOSIS — M62838 Other muscle spasm: Secondary | ICD-10-CM | POA: Diagnosis not present

## 2019-01-12 NOTE — Patient Instructions (Signed)
   Exhale and pull your chin in the pull the shoulder blades together and extend the upper back and try to lift the legs up, feeling the buttocks squeeze. Hold for ~5 seconds and then relax, repeat 12 times. (~ 1 min total)

## 2019-01-12 NOTE — Therapy (Signed)
[place] MAIN [facility] SERVICES [address] [place], [place], [address] Phone: [phone]   Fax:  [phone]  Physical Therapy Treatment  The patient has been informed of current processes in place at Outpatient Rehab to protect patients from Covid-19 exposure including social distancing, schedule modifications, and new cleaning procedures. After discussing their particular risk with a therapist based on the patient's personal risk factors, the patient has decided to proceed with in-person therapy.   Patient Details  Name: Julia Deleon MRN: [id number] Date of Birth: May 02, 1946 Referring Provider (PT): Dr. Maryan Puls   Encounter Date: 01/12/2019  PT End of Session - 01/12/19 1018    Visit Number  15    Number of Visits  23    Date for PT Re-Evaluation  02/09/19    Authorization Type  Medicare    Authorization Time Period  through 02/09/19    Authorization - Visit Number  12    Authorization - Number of Visits  20    PT Start Time  0908    PT Stop Time  0737    PT Time Calculation (min)  60 min    Activity Tolerance  Patient tolerated treatment well;No increased pain    Behavior During Therapy  WFL for tasks assessed/performed       Past Medical History:  Diagnosis Date  . Arthritis   . COPD (chronic obstructive pulmonary disease) ([place])   . Family history of adverse reaction to anesthesia    father was slow to wake up  . Fuch's endothelial dystrophy   . Hyperlipidemia   . Hypothyroidism   . PONV (postoperative nausea and vomiting)    slow to wake up and PONV    Past Surgical History:  Procedure Laterality Date  . APPENDECTOMY    . BACK SURGERY  09/26/2018   L4-5 PLIF by Dr. Arnoldo Morale  . CARDIAC CATHETERIZATION    . CHOLECYSTECTOMY    . EYE SURGERY    . FOOT SURGERY Right    pin removed left  . laser vein surgery    . NASAL SINUS SURGERY    . RIGHT/LEFT HEART CATH AND CORONARY ANGIOGRAPHY N/A 12/06/2017   Procedure:  RIGHT/LEFT HEART CATH AND CORONARY ANGIOGRAPHY;  Surgeon: Yolonda Kida, MD;  Location: [place] [facility];  Service: Cardiovascular;  Laterality: N/A;  . TONSILLECTOMY      There were no vitals filed for this visit.   Pelvic Floor Physical Therapy Treatment and Progress Note  SCREENING  Changes in medications, allergies, or medical history?: none  SUBJECTIVE  Patient reports: Trending toward lower overall pain. She only emptied 40m yesterday at 1:30 Still taking 1/2 muscle relaxer by 2:45 she was able to empty 2312m Precautions:  Vaginal atrophy, sciatica, osteoporosis  Pain update:  Location of pain: B hips lateral Current pain:  6/10  Max pain:  7/10 Least pain:  4/10   Patient Goals: Improve bladder function, decrease back pain and increase strength and balance.   OBJECTIVE  Changes in:  Observation/posture: Continued decreased knee TKE on L>R with tightness now in Biceps femoris.  ROM/Mobility: Pt. Is limited in rotation exercise by tightness in R shoulder (Hx. Of frozen shoulder).   Unable to lift thighs off of the table with super-man due to anterior tightness but getting appropriate glute recruitment with attempts.   INTERVENTIONS THIS SESSION: Manual: Performed TP release to  L semitendinosus, R Glute Med and min. to decrease spasm and pain and allow  for improved balance of musculature for improved function and decreased symptoms. Dry-needle: Performed TPDN with a .30x38m needle to L semitendinosus, R Glute Med and min. as described below to decrease spasm and pain and allow for improved balance of musculature for improved function and decreased symptoms. Therex: Educated on and practiced modified prone super-man's for gentle thoracic mobility and posterior chain strengthening into decreased kyphosis and improve posture.    Total time: 60 min.                    Trigger Point Dry Needling - 01/12/19 0001    Consent Given?   Yes    Education Handout Provided  No    Muscles Treated Lower Quadrant  Hamstring    Dry Needling Comments  semitendonosis     Hamstring Response  Twitch response elicited;Palpable increased muscle length             PT Short Term Goals - 01/04/19 1522      PT SHORT TERM GOAL #1   Title  Patient will demonstrate improved pelvic alignment and balance of musculature surrounding the pelvis to facilitate decreased PFM spasms and decrease pelvic pain.    Baseline  Pt. demonstrates severe posterior pelvic tilt, L thoracic/R lumbar scoliosis, and R anterior/L posterior pelvic obliquity.    Time  5    Period  Weeks    Status  Partially Met    Target Date  12/20/18      PT SHORT TERM GOAL #2   Title  Patient will demonstrate a coordinated contraction, relaxation, and bulge of the pelvic floor muscles to demonstrate functional recruitment and motion and allow for bladder emptying.    Baseline  Pt. is unable to fully empty bladder and describes pain with catheterization, demonstrating high tone of PFM.    Time  5    Period  Weeks    Status  Achieved    Target Date  12/20/18      PT SHORT TERM GOAL #3   Title  Patient will demonstrate improved sitting and standing posture to demonstrate learning and decrease stress on the pelvic floor with functional activity.    Baseline  Pt. demonstrates severe posterior pelvic tilt, L thoracic/R lumbar scoliosis, and R anterior/L posterior pelvic obliquity.    Time  5    Period  Weeks    Status  Partially Met    Target Date  12/20/18        PT Long Term Goals - 01/04/19 1523      PT LONG TERM GOAL #1   Title  Patient will describe pain no greater than 3/10 during Sitting for 2 hours to demonstrate improved functional ability.    Baseline  Max 9/10, worst with prolonged sitting. (Pt. ignoring pain at initial eval)    Time  10    Period  Weeks    Status  On-going    Target Date  02/09/19      PT LONG TERM GOAL #2   Title  Pt. will report  walking for 30 min. without an increase in pain >2/10 to allow for return to exercise and demonstrate improved function.    Baseline  walking ~ 10 min with pain ~ 6/10    Time  10    Period  Weeks    Status  On-going    Target Date  02/09/19      PT LONG TERM GOAL #3   Title  Pt. Will be able  to cath without pain and will be cleared by physiscian to d/c self-cath due to decreased residual volume with bladder emptying.    Baseline  required to cath 3 times per day, painful each time.    Time  10    Period  Weeks    Status  On-going    Target Date  02/09/19      PT LONG TERM GOAL #4   Title  Patient will score less than or equal to 40% on the Female NIH-CPSI and 30% on the [facility] to demonstrate a reduction in pain, urinary symptoms, and an improved quality of life.    Baseline  Female NIH-CPSI: 38/43 (88%) , PDI: 45/75 (60%)    Time  10    Period  Weeks    Status  New    Target Date  02/09/19            Plan - 01/12/19 1019    Clinical Impression Statement  Pt. Responded well to all interventions today, demonstrating decreased spasms and tenderness and increased L knee EXT with some restriction remaining, as well as understanding and correct performance of all education and exercises provided today. They will continue to benefit from skilled physical therapy to work toward remaining goals and maximize function as well as decrease likelihood of symptom increase or recurrence.     Personal Factors and Comorbidities  Age;Comorbidity 3+    Comorbidities  Osteoporosis, scoliosis, Arthritis, COPD, recent lumbosacral fusion for spondylolisthesis.    Examination-Activity Limitations  Toileting;Sit;Stand;Bend;Lift;Carry    Examination-Participation Restrictions  Interpersonal Relationship;Yard Work;Cleaning;Laundry    Stability/Clinical Decision Making  Unstable/Unpredictable    Rehab Potential  Good    PT Frequency  2x / week    PT Duration  Other (comment)   10 weeks   PT  Treatment/Interventions  ADLs/Self Care Home Management;Aquatic Therapy;Moist Heat;Electrical Stimulation;Traction;Therapeutic activities;Functional mobility training;Stair training;Gait training;Therapeutic exercise;Balance training;Neuromuscular re-education;Patient/family education;Manual techniques;Dry needling;Passive range of motion;Scar mobilization;Taping    PT Next Visit Plan  TPDN to L hamstrings (prone near biceps femoris) hip flexor PRN, back, review pelvic tilts, teapot,  and TA in mod-quad, MET correction etc.  Internal TP release to R PRN, scar mobility at 7 o'clock?.    PT Home Exercise Plan  diaphragmatic breathing, hip-flexor stretch, side-stretch, pelvic tilts in seated, TA in mod-quad, diaphragmatic breathing, supine butterfly, bow-and-arrow, posterior fourchette massage, urge-suppression, squatty potty, doorway chest-stretch    Consulted and Agree with Plan of Care  Patient       Patient will benefit from skilled therapeutic intervention in order to improve the following deficits and impairments:  Abnormal gait, Decreased balance, Increased muscle spasms, Decreased range of motion, Decreased scar mobility, Improper body mechanics, Decreased coordination, Decreased strength, Increased fascial restricitons, Impaired flexibility, Postural dysfunction, Pain  Visit Diagnosis: Abnormal posture  Other muscle spasm     Problem List Patient Active Problem List   Diagnosis Date Noted  . Chronic UTI 12/06/2018  . Status post lumbar surgery 12/06/2018  . Spondylolisthesis, lumbar region 09/26/2018  . Aortic atherosclerosis ([place]) 08/31/2018  . CAD (coronary artery disease) 02/23/2018  . Fatigue 01/20/2018  . Short of breath on exertion 10/17/2017  . Advanced care planning/counseling discussion 03/28/2017  . Varicose veins of both lower extremities with complications 09/62/8366  . Arthritis 12/09/2016  . Vaginal atrophy 09/10/2015  . Menopausal hot flushes 09/10/2015  .  Menopause 09/10/2015  . Onychomycosis due to dermatophyte 04/07/2015  . Bursitis of right shoulder 02/26/2015  . Environmental and seasonal allergies  02/26/2015  . Other allergic rhinitis 02/26/2015  . Osteoporosis 11/26/2014  . COPD (chronic obstructive pulmonary disease) ([place]) 11/26/2014  . Fuchs' corneal dystrophy 11/26/2014  . Essential (primary) hypertension 11/26/2014  . Hyperlipidemia 11/26/2014  . Chronic kidney disease, stage III (moderate) ([place]) 11/26/2014  . Hypothyroidism 11/26/2014  . Low back pain 11/26/2014  . Endothelial corneal dystrophy 11/26/2014   Willa Rough DPT, ATC Willa Rough 01/12/2019, 10:39 AM  [place] MAIN [facility] SERVICES [address] [place], [place], [address] Phone: [phone]   Fax:  [phone]  Name: Varetta C Ram MRN: [id number] Date of Birth: [birth year]

## 2019-01-16 ENCOUNTER — Ambulatory Visit: Payer: Medicare Other

## 2019-01-16 ENCOUNTER — Other Ambulatory Visit: Payer: Self-pay

## 2019-01-16 DIAGNOSIS — R293 Abnormal posture: Secondary | ICD-10-CM

## 2019-01-16 DIAGNOSIS — M62838 Other muscle spasm: Secondary | ICD-10-CM

## 2019-01-16 NOTE — Patient Instructions (Signed)
   Side of hip stretch:  Reclined twist for hips and side of the hips/ legs  Lay on your back, knees bent Scoot hips to the L , leave shoulders in place Drop knees to the R side resting onto pillows to keep leg at the same width of hips Pillow under R thigh to minimize too much strain   *hold for 5 deep breaths, repeat 2-3 times.     Right-side only keep the shoulder blade down away from the ear.  Lean your armpit toward the door frame, allowing the hand to slide up the wall and feel the stretch through the shoulder and upper back. Do 2x10

## 2019-01-16 NOTE — Therapy (Signed)
[place] [facility] SERVICES [address] [place], [place], [address] Phone: [phone]   Fax:  [phone]  Physical Therapy Treatment  The patient has been informed of current processes in place at Outpatient Rehab to protect patients from Covid-19 exposure including social distancing, schedule modifications, and new cleaning procedures. After discussing their particular risk with a therapist based on the patient's personal risk factors, the patient has decided to proceed with in-person therapy.   Patient Details  Name: Julia Deleon MRN: [id number] Date of Birth: 1946/02/15 Referring Provider (PT): Dr. Maryan Puls   Encounter Date: 01/16/2019  PT End of Session - 01/16/19 1540    Visit Number  16    Number of Visits  23    Date for PT Re-Evaluation  02/09/19    Authorization Type  Medicare    Authorization Time Period  through 02/09/19    Authorization - Visit Number  13    Authorization - Number of Visits  20    PT Start Time  1400    PT Stop Time  1520    PT Time Calculation (min)  80 min    Activity Tolerance  Patient tolerated treatment well;No increased pain    Behavior During Therapy  WFL for tasks assessed/performed       Past Medical History:  Diagnosis Date  . Arthritis   . COPD (chronic obstructive pulmonary disease) ([place])   . Family history of adverse reaction to anesthesia    father was slow to wake up  . Fuch's endothelial dystrophy   . Hyperlipidemia   . Hypothyroidism   . PONV (postoperative nausea and vomiting)    slow to wake up and PONV    Past Surgical History:  Procedure Laterality Date  . APPENDECTOMY    . BACK SURGERY  09/26/2018   L4-5 PLIF by Dr. Arnoldo Morale  . CARDIAC CATHETERIZATION    . CHOLECYSTECTOMY    . EYE SURGERY    . FOOT SURGERY Right    pin removed left  . laser vein surgery    . NASAL SINUS SURGERY    . RIGHT/LEFT HEART CATH AND CORONARY ANGIOGRAPHY N/A 12/06/2017   Procedure:  RIGHT/LEFT HEART CATH AND CORONARY ANGIOGRAPHY;  Surgeon: Yolonda Kida, MD;  Location: [facility];  Service: Cardiovascular;  Laterality: N/A;  . TONSILLECTOMY      There were no vitals filed for this visit.    Pelvic Floor Physical Therapy Treatment and Progress Note  SCREENING  Changes in medications, allergies, or medical history?: none  SUBJECTIVE  Patient reports: Pain has been 7-8 on average, rode in the car for 4 hours and went shopping on Sunday. No change in urinary symptoms. Was not able to go on Sunday or Monday but went 60cc today. Was outside spraying weeds this morning for the first time since her surgery.   Precautions:  Vaginal atrophy, sciatica, osteoporosis  Pain update:  Location of pain: B hips lateral Current pain:  6/10  Max pain:  8/10 Least pain:  4/10  **pain 3/10 following treatment  Patient Goals: Improve bladder function, decrease back pain and increase strength and balance.   OBJECTIVE  Changes in:  Observation/posture: Continued decreased knee TKE on L>R with tightness now in Biceps femoris.  ROM/Mobility: Pt. Continues to be limited in rotation exercise by tightness in R shoulder (Hx. Of frozen shoulder).   Palpation: TTP to R pectoralis major and minor, L QL, Vastus lateralis, and biceps  femoris.  Pelvic floor: TTP to L PR both anteriorly and posteriorly, OI and less to coccygeus. Decreased mobility and pain remains at 7 o'clock.  Decreased tenderness and improved scar length by at least 50% following treatment.   INTERVENTIONS THIS SESSION: Manual: Performed TP release to L QL and vastus lateralis externally and L PR both anteriorly and posteriorly, OI and coccygeus internally to decrease spasm and pain and allow for improved balance of musculature for improved function and decreased symptoms. Dry-needle: Performed TPDN with a .30x68m needle to L QL as described below to decrease spasm and pain and allow for  improved balance of musculature for improved function and decreased symptoms. Therex: Educated on and practiced standing shoulder ABD with thoracic lengthening and hook-lying biased spinal twist to improve spinal mobility and begin process of decreasing impact of scoliosis. Created a list of appropriate and inappropriate exercises for Pt. To work on strengthening with her pPhysiological scientistwithout causing harm.    Total time: 80 min.                            PT Short Term Goals - 01/04/19 1522      PT SHORT TERM GOAL #1   Title  Patient will demonstrate improved pelvic alignment and balance of musculature surrounding the pelvis to facilitate decreased PFM spasms and decrease pelvic pain.    Baseline  Pt. demonstrates severe posterior pelvic tilt, L thoracic/R lumbar scoliosis, and R anterior/L posterior pelvic obliquity.    Time  5    Period  Weeks    Status  Partially Met    Target Date  12/20/18      PT SHORT TERM GOAL #2   Title  Patient will demonstrate a coordinated contraction, relaxation, and bulge of the pelvic floor muscles to demonstrate functional recruitment and motion and allow for bladder emptying.    Baseline  Pt. is unable to fully empty bladder and describes pain with catheterization, demonstrating high tone of PFM.    Time  5    Period  Weeks    Status  Achieved    Target Date  12/20/18      PT SHORT TERM GOAL #3   Title  Patient will demonstrate improved sitting and standing posture to demonstrate learning and decrease stress on the pelvic floor with functional activity.    Baseline  Pt. demonstrates severe posterior pelvic tilt, L thoracic/R lumbar scoliosis, and R anterior/L posterior pelvic obliquity.    Time  5    Period  Weeks    Status  Partially Met    Target Date  12/20/18        PT Long Term Goals - 01/04/19 1523      PT LONG TERM GOAL #1   Title  Patient will describe pain no greater than 3/10 during Sitting for 2 hours to  demonstrate improved functional ability.    Baseline  Max 9/10, worst with prolonged sitting. (Pt. ignoring pain at initial eval)    Time  10    Period  Weeks    Status  On-going    Target Date  02/09/19      PT LONG TERM GOAL #2   Title  Pt. will report walking for 30 min. without an increase in pain >2/10 to allow for return to exercise and demonstrate improved function.    Baseline  walking ~ 10 min with pain ~ 6/10    Time  10    Period  Weeks    Status  On-going    Target Date  02/09/19      PT LONG TERM GOAL #3   Title  Pt. Will be able to cath without pain and will be cleared by physiscian to d/c self-cath due to decreased residual volume with bladder emptying.    Baseline  required to cath 3 times per day, painful each time.    Time  10    Period  Weeks    Status  On-going    Target Date  02/09/19      PT LONG TERM GOAL #4   Title  Patient will score less than or equal to 40% on the Female NIH-CPSI and 30% on the [facility] to demonstrate a reduction in pain, urinary symptoms, and an improved quality of life.    Baseline  Female NIH-CPSI: 38/43 (88%) , PDI: 45/75 (60%)    Time  10    Period  Weeks    Status  New    Target Date  02/09/19            Plan - 01/16/19 1541    Clinical Impression Statement  Pt. Responded well to all interventions today, demonstrating improved pain from 6/10 to 3/10 and decreased spasms as well as understanding and correct performance of all education and exercises provided today. They will continue to benefit from skilled physical therapy to work toward remaining goals and maximize function as well as decrease likelihood of symptom increase or recurrence.     Personal Factors and Comorbidities  Age;Comorbidity 3+    Comorbidities  Osteoporosis, scoliosis, Arthritis, COPD, recent lumbosacral fusion for spondylolisthesis.    Examination-Activity Limitations  Toileting;Sit;Stand;Bend;Lift;Carry    Examination-Participation Restrictions   Interpersonal Relationship;Yard Work;Cleaning;Laundry    Stability/Clinical Decision Making  Unstable/Unpredictable    Rehab Potential  Good    PT Frequency  2x / week    PT Duration  Other (comment)   10 weeks   PT Treatment/Interventions  ADLs/Self Care Home Management;Aquatic Therapy;Moist Heat;Electrical Stimulation;Traction;Therapeutic activities;Functional mobility training;Stair training;Gait training;Therapeutic exercise;Balance training;Neuromuscular re-education;Patient/family education;Manual techniques;Dry needling;Passive range of motion;Scar mobilization;Taping    PT Next Visit Plan  manual work around R shoulder TPDN to L hamstrings (prone near biceps femoris) hip flexor PRN, back, review pelvic tilts, teapot,  and TA in mod-quad, MET correction etc.  Internal TP release to R PRN, scar mobility at 7 o'clock?.    PT Home Exercise Plan  diaphragmatic breathing, hip-flexor stretch, side-stretch, pelvic tilts in seated, TA in mod-quad, diaphragmatic breathing, supine butterfly, bow-and-arrow, posterior fourchette massage, urge-suppression, squatty potty, doorway chest-stretch    Consulted and Agree with Plan of Care  Patient       Patient will benefit from skilled therapeutic intervention in order to improve the following deficits and impairments:  Abnormal gait, Decreased balance, Increased muscle spasms, Decreased range of motion, Decreased scar mobility, Improper body mechanics, Decreased coordination, Decreased strength, Increased fascial restricitons, Impaired flexibility, Postural dysfunction, Pain  Visit Diagnosis: Abnormal posture  Other muscle spasm     Problem List Patient Active Problem List   Diagnosis Date Noted  . Chronic UTI 12/06/2018  . Status post lumbar surgery 12/06/2018  . Spondylolisthesis, lumbar region 09/26/2018  . Aortic atherosclerosis ([place]) 08/31/2018  . CAD (coronary artery disease) 02/23/2018  . Fatigue 01/20/2018  . Short of breath on exertion  10/17/2017  . Advanced care planning/counseling discussion 03/28/2017  . Varicose veins of both lower extremities with complications 95/18/8416  .  Arthritis 12/09/2016  . Vaginal atrophy 09/10/2015  . Menopausal hot flushes 09/10/2015  . Menopause 09/10/2015  . Onychomycosis due to dermatophyte 04/07/2015  . Bursitis of right shoulder 02/26/2015  . Environmental and seasonal allergies 02/26/2015  . Other allergic rhinitis 02/26/2015  . Osteoporosis 11/26/2014  . COPD (chronic obstructive pulmonary disease) ([place]) 11/26/2014  . Fuchs' corneal dystrophy 11/26/2014  . Essential (primary) hypertension 11/26/2014  . Hyperlipidemia 11/26/2014  . Chronic kidney disease, stage III (moderate) ([place]) 11/26/2014  . Hypothyroidism 11/26/2014  . Low back pain 11/26/2014  . Endothelial corneal dystrophy 11/26/2014   Willa Rough DPT, ATC Willa Rough 01/16/2019, 4:15 PM  [place] MAIN [facility] SERVICES [address] [place], [place], [address] Phone: [phone]   Fax:  [phone]  Name: Julia Deleon MRN: [id number] Date of Birth: May 24, 1945

## 2019-01-17 DIAGNOSIS — N390 Urinary tract infection, site not specified: Secondary | ICD-10-CM | POA: Diagnosis not present

## 2019-01-17 DIAGNOSIS — R339 Retention of urine, unspecified: Secondary | ICD-10-CM | POA: Diagnosis not present

## 2019-01-18 ENCOUNTER — Other Ambulatory Visit: Payer: Self-pay

## 2019-01-18 ENCOUNTER — Ambulatory Visit: Payer: Medicare Other

## 2019-01-18 ENCOUNTER — Encounter: Payer: Medicare Other | Admitting: Physical Therapy

## 2019-01-18 DIAGNOSIS — R338 Other retention of urine: Secondary | ICD-10-CM | POA: Diagnosis not present

## 2019-01-18 DIAGNOSIS — M62838 Other muscle spasm: Secondary | ICD-10-CM | POA: Diagnosis not present

## 2019-01-18 DIAGNOSIS — R293 Abnormal posture: Secondary | ICD-10-CM

## 2019-01-18 DIAGNOSIS — N310 Uninhibited neuropathic bladder, not elsewhere classified: Secondary | ICD-10-CM | POA: Diagnosis not present

## 2019-01-18 DIAGNOSIS — J301 Allergic rhinitis due to pollen: Secondary | ICD-10-CM | POA: Diagnosis not present

## 2019-01-18 NOTE — Therapy (Signed)
[place] [facility] REHAB SERVICES [address] [place], [place], [address] Phone: [phone]   Fax:  [phone]  Physical Therapy Treatment  The patient has been informed of current processes in place at Outpatient Rehab to protect patients from Covid-19 exposure including social distancing, schedule modifications, and new cleaning procedures. After discussing their particular risk with a therapist based on the patient's personal risk factors, the patient has decided to proceed with in-person therapy.   Patient Details  Name: Julia Deleon MRN: [id number] Date of Birth: 11/28/1945 Referring Provider (PT): Dr. Michael Wolff   Encounter Date: 01/18/2019  PT End of Session - 01/18/19 1039    Visit Number  17    Number of Visits  23    Date for PT Re-Evaluation  02/09/19    Authorization Type  Medicare    Authorization Time Period  through 02/09/19    Authorization - Visit Number  14    Authorization - Number of Visits  20    PT Start Time  0905    PT Stop Time  1005    PT Time Calculation (min)  60 min    Activity Tolerance  Patient tolerated treatment well;No increased pain    Behavior During Therapy  WFL for tasks assessed/performed       Past Medical History:  Diagnosis Date  . Arthritis   . COPD (chronic obstructive pulmonary disease) ([facility])   . Family history of adverse reaction to anesthesia    father was slow to wake up  . Fuch's endothelial dystrophy   . Hyperlipidemia   . Hypothyroidism   . PONV (postoperative nausea and vomiting)    slow to wake up and PONV    Past Surgical History:  Procedure Laterality Date  . APPENDECTOMY    . BACK SURGERY  09/26/2018   L4-5 PLIF by Dr. Jenkins  . CARDIAC CATHETERIZATION    . CHOLECYSTECTOMY    . EYE SURGERY    . FOOT SURGERY Right    pin removed left  . laser vein surgery    . NASAL SINUS SURGERY    . RIGHT/LEFT HEART CATH AND CORONARY ANGIOGRAPHY N/A 12/06/2017   Procedure:  RIGHT/LEFT HEART CATH AND CORONARY ANGIOGRAPHY;  Surgeon: Callwood, Dwayne D, MD;  Location: [facility];  Service: Cardiovascular;  Laterality: N/A;  . TONSILLECTOMY      There were no vitals filed for this visit.    Pelvic Floor Physical Therapy Treatment and Progress Note  SCREENING  Changes in medications, allergies, or medical history?: none  SUBJECTIVE  Patient reports: Drove to [employer] and back and thinks that it may be why her L HS was tighter. Was not using her back brace. Is sore and tired this morning because it is earlier in the day.  Precautions:  Vaginal atrophy, sciatica, osteoporosis  Pain update:  Location of pain: B hips lateral Current pain:  7/10  Max pain:  8/10 Least pain:  4/10   Patient Goals: Improve bladder function, decrease back pain and increase strength and balance.   OBJECTIVE  Changes in:  Observation/posture: Posterior pelvic tilt.  ROM/Mobility: Following release to Pec. Maj. Pt. Able to feel twist lower in spine (R rot). Increased Sx. Into LLE with R rotation.  Palpation: TTP to L semitendinosus, Right Erector spinae and multifidus at ~ L3-L4 and ~L5 and R Pec. Major    INTERVENTIONS THIS SESSION: Manual: Performed TP release to L semitendinosus, Right Erector spinae at ~   L3-L4 and ~L5 and R Pec. Major to decrease spasm and pain and allow for improved balance of musculature for improved function and decreased symptoms. Performed MWM into R lumbo-thoracic rotation and grade 3-4 R to L spinal mobs at L2-3 to improve mobility of joint and surrounding connective tissue and decrease pressure on nerve roots for improved conductivity and function of down-stream tissues.  Dry-needle: Performed TPDN with a .30x63m needle to L QL as described below to decrease spasm and pain and allow for improved balance of musculature for improved function and decreased symptoms.     Total time: 60  min.                   Trigger Point Dry Needling - 01/18/19 0001    Consent Given?  Yes    Education Handout Provided  No    Muscles Treated Lower Quadrant  Hamstring    Muscles Treated Back/Hip  Erector spinae;Lumbar multifidi    Hamstring Response  Twitch response elicited;Palpable increased muscle length    Erector spinae Response  Twitch response elicited;Palpable increased muscle length    Lumbar multifidi Response  Twitch response elicited;Palpable increased muscle length             PT Short Term Goals - 01/04/19 1522      PT SHORT TERM GOAL #1   Title  Patient will demonstrate improved pelvic alignment and balance of musculature surrounding the pelvis to facilitate decreased PFM spasms and decrease pelvic pain.    Baseline  Pt. demonstrates severe posterior pelvic tilt, L thoracic/R lumbar scoliosis, and R anterior/L posterior pelvic obliquity.    Time  5    Period  Weeks    Status  Partially Met    Target Date  12/20/18      PT SHORT TERM GOAL #2   Title  Patient will demonstrate a coordinated contraction, relaxation, and bulge of the pelvic floor muscles to demonstrate functional recruitment and motion and allow for bladder emptying.    Baseline  Pt. is unable to fully empty bladder and describes pain with catheterization, demonstrating high tone of PFM.    Time  5    Period  Weeks    Status  Achieved    Target Date  12/20/18      PT SHORT TERM GOAL #3   Title  Patient will demonstrate improved sitting and standing posture to demonstrate learning and decrease stress on the pelvic floor with functional activity.    Baseline  Pt. demonstrates severe posterior pelvic tilt, L thoracic/R lumbar scoliosis, and R anterior/L posterior pelvic obliquity.    Time  5    Period  Weeks    Status  Partially Met    Target Date  12/20/18        PT Long Term Goals - 01/04/19 1523      PT LONG TERM GOAL #1   Title  Patient will describe pain no greater than  3/10 during Sitting for 2 hours to demonstrate improved functional ability.    Baseline  Max 9/10, worst with prolonged sitting. (Pt. ignoring pain at initial eval)    Time  10    Period  Weeks    Status  On-going    Target Date  02/09/19      PT LONG TERM GOAL #2   Title  Pt. will report walking for 30 min. without an increase in pain >2/10 to allow for return to exercise and demonstrate improved function.  Baseline  walking ~ 10 min with pain ~ 6/10    Time  10    Period  Weeks    Status  On-going    Target Date  02/09/19      PT LONG TERM GOAL #3   Title  Pt. Will be able to cath without pain and will be cleared by physiscian to d/c self-cath due to decreased residual volume with bladder emptying.    Baseline  required to cath 3 times per day, painful each time.    Time  10    Period  Weeks    Status  On-going    Target Date  02/09/19      PT LONG TERM GOAL #4   Title  Patient will score less than or equal to 40% on the Female NIH-CPSI and 30% on the PDI to demonstrate a reduction in pain, urinary symptoms, and an improved quality of life.    Baseline  Female NIH-CPSI: 38/43 (88%) , PDI: 45/75 (60%)    Time  10    Period  Weeks    Status  New    Target Date  02/09/19            Plan - 01/18/19 1039    Clinical Impression Statement  Pt. Responded well to all interventions today, demonstrating deceased spasm and improved TKE ROM on L as well as improved R shoulder ROM to allow for appropriate performance of bow-and-arrow exercise performance with appropriate impact as well as understanding and correct performance of all education and exercises provided today. They will continue to benefit from skilled physical therapy to work toward remaining goals and maximize function as well as decrease likelihood of symptom increase or recurrence.     Personal Factors and Comorbidities  Age;Comorbidity 3+    Comorbidities  Osteoporosis, scoliosis, Arthritis, COPD, recent lumbosacral  fusion for spondylolisthesis.    Examination-Activity Limitations  Toileting;Sit;Stand;Bend;Lift;Carry    Examination-Participation Restrictions  Interpersonal Relationship;Yard Work;Cleaning;Laundry    Stability/Clinical Decision Making  Unstable/Unpredictable    Rehab Potential  Good    PT Frequency  2x / week    PT Duration  Other (comment)   10 weeks   PT Treatment/Interventions  ADLs/Self Care Home Management;Aquatic Therapy;Moist Heat;Electrical Stimulation;Traction;Therapeutic activities;Functional mobility training;Stair training;Gait training;Therapeutic exercise;Balance training;Neuromuscular re-education;Patient/family education;Manual techniques;Dry needling;Passive range of motion;Scar mobilization;Taping    PT Next Visit Plan  manual work around R shoulder TPDN to L hamstrings (prone near biceps femoris) hip flexor PRN, back, review pelvic tilts, teapot,  and TA in mod-quad, MET correction etc.  Internal TP release to R PRN, scar mobility at 7 o'clock?.    PT Home Exercise Plan  diaphragmatic breathing, hip-flexor stretch, side-stretch, pelvic tilts in seated, TA in mod-quad, diaphragmatic breathing, supine butterfly, bow-and-arrow, posterior fourchette massage, urge-suppression, squatty potty, doorway chest-stretch, arm ABD slides, biased lumbar twist stretch    Consulted and Agree with Plan of Care  Patient       Patient will benefit from skilled therapeutic intervention in order to improve the following deficits and impairments:  Abnormal gait, Decreased balance, Increased muscle spasms, Decreased range of motion, Decreased scar mobility, Improper body mechanics, Decreased coordination, Decreased strength, Increased fascial restricitons, Impaired flexibility, Postural dysfunction, Pain  Visit Diagnosis: Abnormal posture  Other muscle spasm     Problem List Patient Active Problem List   Diagnosis Date Noted  . Chronic UTI 12/06/2018  . Status post lumbar surgery  12/06/2018  . Spondylolisthesis, lumbar region 09/26/2018  . Aortic   atherosclerosis ([place]) 08/31/2018  . CAD (coronary artery disease) 02/23/2018  . Fatigue 01/20/2018  . Short of breath on exertion 10/17/2017  . Advanced care planning/counseling discussion 03/28/2017  . Varicose veins of both lower extremities with complications 12/75/1700  . Arthritis 12/09/2016  . Vaginal atrophy 09/10/2015  . Menopausal hot flushes 09/10/2015  . Menopause 09/10/2015  . Onychomycosis due to dermatophyte 04/07/2015  . Bursitis of right shoulder 02/26/2015  . Environmental and seasonal allergies 02/26/2015  . Other allergic rhinitis 02/26/2015  . Osteoporosis 11/26/2014  . COPD (chronic obstructive pulmonary disease) ([place]) 11/26/2014  . Fuchs' corneal dystrophy 11/26/2014  . Essential (primary) hypertension 11/26/2014  . Hyperlipidemia 11/26/2014  . Chronic kidney disease, stage III (moderate) ([place]) 11/26/2014  . Hypothyroidism 11/26/2014  . Low back pain 11/26/2014  . Endothelial corneal dystrophy 11/26/2014   Willa Rough DPT, ATC Willa Rough 01/18/2019, 10:57 AM  [place] MAIN [facility] SERVICES [address] [place], [place], [address] Phone: [phone]   Fax:  [phone]  Name: Aevah C Fakhouri MRN: [id number] Date of Birth: Jun 12, 1945

## 2019-01-19 DIAGNOSIS — M4316 Spondylolisthesis, lumbar region: Secondary | ICD-10-CM | POA: Diagnosis not present

## 2019-01-23 ENCOUNTER — Other Ambulatory Visit: Payer: Self-pay

## 2019-01-23 ENCOUNTER — Ambulatory Visit: Payer: Medicare Other

## 2019-01-23 DIAGNOSIS — M62838 Other muscle spasm: Secondary | ICD-10-CM | POA: Diagnosis not present

## 2019-01-23 DIAGNOSIS — R293 Abnormal posture: Secondary | ICD-10-CM

## 2019-01-23 NOTE — Therapy (Signed)
[place] MAIN [facility] SERVICES [address] [place], [place], [address] Phone: [phone]   Fax:  [phone]  Physical Therapy Treatment  The patient has been informed of current processes in place at Outpatient Rehab to protect patients from Covid-19 exposure including social distancing, schedule modifications, and new cleaning procedures. After discussing their particular risk with a therapist based on the patient's personal risk factors, the patient has decided to proceed with in-person therapy.   Patient Details  Name: Julia Deleon MRN: [id number] Date of Birth: [birth year] Referring Provider (PT): Dr. Maryan Puls   Encounter Date: 01/23/2019  PT End of Session - 01/23/19 1636    Visit Number  18    Number of Visits  23    Date for PT Re-Evaluation  02/09/19    Authorization Type  Medicare    Authorization Time Period  through 02/09/19    Authorization - Visit Number  15    Authorization - Number of Visits  20    PT Start Time  0626    PT Stop Time  1640    PT Time Calculation (min)  70 min    Activity Tolerance  Patient tolerated treatment well;No increased pain    Behavior During Therapy  WFL for tasks assessed/performed       Past Medical History:  Diagnosis Date  . Arthritis   . COPD (chronic obstructive pulmonary disease) ([place])   . Family history of adverse reaction to anesthesia    father was slow to wake up  . Fuch's endothelial dystrophy   . Hyperlipidemia   . Hypothyroidism   . PONV (postoperative nausea and vomiting)    slow to wake up and PONV    Past Surgical History:  Procedure Laterality Date  . APPENDECTOMY    . BACK SURGERY  09/26/2018   L4-5 PLIF by Dr. Arnoldo Morale  . CARDIAC CATHETERIZATION    . CHOLECYSTECTOMY    . EYE SURGERY    . FOOT SURGERY Right    pin removed left  . laser vein surgery    . NASAL SINUS SURGERY    . RIGHT/LEFT HEART CATH AND CORONARY ANGIOGRAPHY N/A 12/06/2017   Procedure:  RIGHT/LEFT HEART CATH AND CORONARY ANGIOGRAPHY;  Surgeon: Yolonda Kida, MD;  Location: [facility];  Service: Cardiovascular;  Laterality: N/A;  . TONSILLECTOMY      There were no vitals filed for this visit.   Pelvic Floor Physical Therapy Treatment and Progress Note  SCREENING  Changes in medications, allergies, or medical history?: none  SUBJECTIVE  Patient reports: Is having only a little bit of urination pre-cath at lunch time still. Pain increased on Saturday while in the car for a long time.   Precautions:  Vaginal atrophy, sciatica, osteoporosis  Pain update:  Location of pain: B hips lateral Current pain:  6/10  Max pain:  8/10 Least pain:  4/10   Patient Goals: Improve bladder function, decrease back pain and increase strength and balance.   OBJECTIVE  Changes in:  Observation/posture: Posterior pelvic tilt.  ROM/Mobility: Following release to Pec. Maj. Pt. Able to feel twist lower in spine (R rot). Increased Sx. Into LLE with R rotation.  Palpation: TTP to L semitendinosus, Right Erector spinae and multifidus at ~ L3-L4 and ~L5 and R Pec. Major    INTERVENTIONS THIS SESSION: Manual: Performed TP release and STM to L Adductor magnus, Vastus intermedius, and ~ L5 multifidus as well as overpressure and mobilization into  L lumbar rotation with long stretch to improve mobility of joint and surrounding connective tissue and decrease pressure on nerve roots for improved conductivity, decreased pain, and improved function of down-stream tissues.  Performed Dry-needle: Performed TPDN with a .30x69m needle as described below to decrease spasm and pain and allow for improved balance of musculature for improved function and decreased symptoms.  Therex: Educated on and practiced SLR in [place]for L VMO activation/strengthening to imporve balance surrounding the L knee and hip for decreased pain and improved posture.     Total time: 60  min.                     Trigger Point Dry Needling - 01/23/19 0001    Consent Given?  Yes    Education Handout Provided  No    Muscles Treated Lower Quadrant  Adductor longus/brevis/magnus;Vastus medialis    Muscles Treated Back/Hip  Lumbar multifidi   L5   Adductor Response  Twitch response elicited;Palpable increased muscle length    Vastus medialis Response  Twitch response elicited;Palpable increased muscle length    Lumbar multifidi Response  Twitch response elicited;Palpable increased muscle length             PT Short Term Goals - 01/04/19 1522      PT SHORT TERM GOAL #1   Title  Patient will demonstrate improved pelvic alignment and balance of musculature surrounding the pelvis to facilitate decreased PFM spasms and decrease pelvic pain.    Baseline  Pt. demonstrates severe posterior pelvic tilt, L thoracic/R lumbar scoliosis, and R anterior/L posterior pelvic obliquity.    Time  5    Period  Weeks    Status  Partially Met    Target Date  12/20/18      PT SHORT TERM GOAL #2   Title  Patient will demonstrate a coordinated contraction, relaxation, and bulge of the pelvic floor muscles to demonstrate functional recruitment and motion and allow for bladder emptying.    Baseline  Pt. is unable to fully empty bladder and describes pain with catheterization, demonstrating high tone of PFM.    Time  5    Period  Weeks    Status  Achieved    Target Date  12/20/18      PT SHORT TERM GOAL #3   Title  Patient will demonstrate improved sitting and standing posture to demonstrate learning and decrease stress on the pelvic floor with functional activity.    Baseline  Pt. demonstrates severe posterior pelvic tilt, L thoracic/R lumbar scoliosis, and R anterior/L posterior pelvic obliquity.    Time  5    Period  Weeks    Status  Partially Met    Target Date  12/20/18        PT Long Term Goals - 01/04/19 1523      PT LONG TERM GOAL #1   Title  Patient will  describe pain no greater than 3/10 during Sitting for 2 hours to demonstrate improved functional ability.    Baseline  Max 9/10, worst with prolonged sitting. (Pt. ignoring pain at initial eval)    Time  10    Period  Weeks    Status  On-going    Target Date  02/09/19      PT LONG TERM GOAL #2   Title  Pt. will report walking for 30 min. without an increase in pain >2/10 to allow for return to exercise and demonstrate improved function.  Baseline  walking ~ 10 min with pain ~ 6/10    Time  10    Period  Weeks    Status  On-going    Target Date  02/09/19      PT LONG TERM GOAL #3   Title  Pt. Will be able to cath without pain and will be cleared by physiscian to d/c self-cath due to decreased residual volume with bladder emptying.    Baseline  required to cath 3 times per day, painful each time.    Time  10    Period  Weeks    Status  On-going    Target Date  02/09/19      PT LONG TERM GOAL #4   Title  Patient will score less than or equal to 40% on the Female NIH-CPSI and 30% on the [facility] to demonstrate a reduction in pain, urinary symptoms, and an improved quality of life.    Baseline  Female NIH-CPSI: 38/43 (88%) , PDI: 45/75 (60%)    Time  10    Period  Weeks    Status  New    Target Date  02/09/19            Plan - 01/23/19 1637    Clinical Impression Statement  Pt. Responded well to all interventions today, demonstrating improved lumbar mobility and VMO recruitment/contractility as well as understanding and correct performance of all education and exercises provided today. They will continue to benefit from skilled physical therapy to work toward remaining goals and maximize function as well as decrease likelihood of symptom increase or recurrence.     Personal Factors and Comorbidities  Age;Comorbidity 3+    Comorbidities  Osteoporosis, scoliosis, Arthritis, COPD, recent lumbosacral fusion for spondylolisthesis.    Examination-Activity Limitations   Toileting;Sit;Stand;Bend;Lift;Carry    Examination-Participation Restrictions  Interpersonal Relationship;Yard Work;Cleaning;Laundry    Stability/Clinical Decision Making  Unstable/Unpredictable    Rehab Potential  Good    PT Frequency  2x / week    PT Duration  Other (comment)   10 weeks   PT Treatment/Interventions  ADLs/Self Care Home Management;Aquatic Therapy;Moist Heat;Electrical Stimulation;Traction;Therapeutic activities;Functional mobility training;Stair training;Gait training;Therapeutic exercise;Balance training;Neuromuscular re-education;Patient/family education;Manual techniques;Dry needling;Passive range of motion;Scar mobilization;Taping    PT Next Visit Plan  Scoliosis management! manual work around R shoulder TPDN to L hamstrings (prone near biceps femoris) hip flexor PRN, back, review pelvic tilts, teapot,  and TA in mod-quad, MET correction etc.  Internal TP release to R PRN, scar mobility at 7 o'clock?.    PT Home Exercise Plan  diaphragmatic breathing, hip-flexor stretch, side-stretch, pelvic tilts in seated, TA in mod-quad, diaphragmatic breathing, supine butterfly, bow-and-arrow, posterior fourchette massage, urge-suppression, squatty potty, doorway chest-stretch, arm ABD slides, biased lumbar twist stretch    Consulted and Agree with Plan of Care  Patient       Patient will benefit from skilled therapeutic intervention in order to improve the following deficits and impairments:  Abnormal gait, Decreased balance, Increased muscle spasms, Decreased range of motion, Decreased scar mobility, Improper body mechanics, Decreased coordination, Decreased strength, Increased fascial restricitons, Impaired flexibility, Postural dysfunction, Pain  Visit Diagnosis: Abnormal posture  Other muscle spasm     Problem List Patient Active Problem List   Diagnosis Date Noted  . Chronic UTI 12/06/2018  . Status post lumbar surgery 12/06/2018  . Spondylolisthesis, lumbar region  09/26/2018  . Aortic atherosclerosis ([place]) 08/31/2018  . CAD (coronary artery disease) 02/23/2018  . Fatigue 01/20/2018  . Short of breath  on exertion 10/17/2017  . Advanced care planning/counseling discussion 03/28/2017  . Varicose veins of both lower extremities with complications 94/01/501  . Arthritis 12/09/2016  . Vaginal atrophy 09/10/2015  . Menopausal hot flushes 09/10/2015  . Menopause 09/10/2015  . Onychomycosis due to dermatophyte 04/07/2015  . Bursitis of right shoulder 02/26/2015  . Environmental and seasonal allergies 02/26/2015  . Other allergic rhinitis 02/26/2015  . Osteoporosis 11/26/2014  . COPD (chronic obstructive pulmonary disease) ([place]) 11/26/2014  . Fuchs' corneal dystrophy 11/26/2014  . Essential (primary) hypertension 11/26/2014  . Hyperlipidemia 11/26/2014  . Chronic kidney disease, stage III (moderate) ([place]) 11/26/2014  . Hypothyroidism 11/26/2014  . Low back pain 11/26/2014  . Endothelial corneal dystrophy 11/26/2014   Willa Rough DPT, ATC Willa Rough 01/23/2019, 5:06 PM  [place] MAIN [facility] SERVICES [address] [place], [place], [address] Phone: [phone]   Fax:  [phone]  Name: Brynnlee C Balderston MRN: [id number] Date of Birth: March 29, 1946

## 2019-01-23 NOTE — Patient Instructions (Signed)
  First, push the knee down into the towel, with the toe pointing out at an angle, then lift the leg, trying to keep it straight the whole time.  Do 2x15. 1 time per day.

## 2019-01-24 DIAGNOSIS — R339 Retention of urine, unspecified: Secondary | ICD-10-CM | POA: Diagnosis not present

## 2019-01-24 DIAGNOSIS — R3914 Feeling of incomplete bladder emptying: Secondary | ICD-10-CM | POA: Diagnosis not present

## 2019-01-24 DIAGNOSIS — Z87440 Personal history of urinary (tract) infections: Secondary | ICD-10-CM | POA: Diagnosis not present

## 2019-01-25 ENCOUNTER — Ambulatory Visit: Payer: Medicare Other

## 2019-01-25 ENCOUNTER — Other Ambulatory Visit: Payer: Self-pay | Admitting: Family Medicine

## 2019-01-25 ENCOUNTER — Other Ambulatory Visit: Payer: Self-pay

## 2019-01-25 DIAGNOSIS — M62838 Other muscle spasm: Secondary | ICD-10-CM

## 2019-01-25 DIAGNOSIS — J301 Allergic rhinitis due to pollen: Secondary | ICD-10-CM | POA: Diagnosis not present

## 2019-01-25 DIAGNOSIS — R293 Abnormal posture: Secondary | ICD-10-CM

## 2019-01-25 MED ORDER — CHLORZOXAZONE 500 MG PO TABS: 250.0000 mg | ORAL_TABLET | Freq: Three times a day (TID) | ORAL | 2 refills | Status: DC

## 2019-01-25 NOTE — Therapy (Signed)
[place] MAIN [facility] SERVICES [address] [place], [place], [address] Phone: [phone]   Fax:  [phone]  Physical Therapy Treatment  The patient has been informed of current processes in place at Outpatient Rehab to protect patients from Covid-19 exposure including social distancing, schedule modifications, and new cleaning procedures. After discussing their particular risk with a therapist based on the patient's personal risk factors, the patient has decided to proceed with in-person therapy.   Patient Details  Name: Julia Deleon MRN: [id number] Date of Birth: [birth year] Referring Provider (PT): Dr. Maryan Puls   Encounter Date: 01/25/2019  PT End of Session - 01/25/19 1404    Visit Number  19    Number of Visits  23    Date for PT Re-Evaluation  02/09/19    Authorization Type  Medicare    Authorization Time Period  through 02/09/19    Authorization - Visit Number  16    Authorization - Number of Visits  20    PT Start Time  1031    PT Stop Time  1406    PT Time Calculation (min)  60 min    Activity Tolerance  Patient tolerated treatment well;No increased pain    Behavior During Therapy  WFL for tasks assessed/performed       Past Medical History:  Diagnosis Date  . Arthritis   . COPD (chronic obstructive pulmonary disease) ([place])   . Family history of adverse reaction to anesthesia    father was slow to wake up  . Fuch's endothelial dystrophy   . Hyperlipidemia   . Hypothyroidism   . PONV (postoperative nausea and vomiting)    slow to wake up and PONV    Past Surgical History:  Procedure Laterality Date  . APPENDECTOMY    . BACK SURGERY  09/26/2018   L4-5 PLIF by Dr. Arnoldo Morale  . CARDIAC CATHETERIZATION    . CHOLECYSTECTOMY    . EYE SURGERY    . FOOT SURGERY Right    pin removed left  . laser vein surgery    . NASAL SINUS SURGERY    . RIGHT/LEFT HEART CATH AND CORONARY ANGIOGRAPHY N/A 12/06/2017   Procedure:  RIGHT/LEFT HEART CATH AND CORONARY ANGIOGRAPHY;  Surgeon: Yolonda Kida, MD;  Location: [facility];  Service: Cardiovascular;  Laterality: N/A;  . TONSILLECTOMY      There were no vitals filed for this visit.   Pelvic Floor Physical Therapy Treatment and Progress Note  SCREENING  Changes in medications, allergies, or medical history?: had another urine study at [facility].  SUBJECTIVE  Patient reports: Her pain is high this morning, the weather has made it worse. Had another urodynamics study and it was at 11, was not able to empty at all. Feels that this is partially due to being nervous about it being "important".   Precautions:  Vaginal atrophy, sciatica, osteoporosis  Pain update:  Location of pain: B hips lateral Current pain:  7/10  Max pain:  8/10 Least pain:  4/10  *7/10 following treatment.  Patient Goals: Improve bladder function, decrease back pain and increase strength and balance.   OBJECTIVE  Changes in:  Observation/posture: L1 vertebrae spinous process is prominent and L deviated >T12 and L2,L3.   ROM/Mobility: Decreased mobility through T-L junction  Palpation: TTP to R T12 erector spinae, L L1 multifidus and L 4   INTERVENTIONS THIS SESSION: Manual: Performed TP release and STM to R  T12 erector spinae, L  L1 multifidus and L 4 Erector spinae as well as R UPA and PA grade 3-4 mobs at T12 and L1 to improve mobility of joint and surrounding connective tissue and decrease pressure on nerve roots for improved conductivity and function of down-stream tissues.  Dry-needle: Performed TPDN with a .30x70m needle as described below to decrease spasm and pain and allow for improved balance of musculature for improved function and decreased symptoms.     Total time: 60 min.                     Trigger Point Dry Needling - 01/25/19 0001    Consent Given?  Yes    Education Handout Provided  No    Muscles Treated Back/Hip   Erector spinae;Lumbar multifidi    Dry Needling Comments  R Erector at ~T12, Multifidus at L1    Erector spinae Response  Twitch response elicited;Palpable increased muscle length    Lumbar multifidi Response  Twitch response elicited;Palpable increased muscle length             PT Short Term Goals - 01/04/19 1522      PT SHORT TERM GOAL #1   Title  Patient will demonstrate improved pelvic alignment and balance of musculature surrounding the pelvis to facilitate decreased PFM spasms and decrease pelvic pain.    Baseline  Pt. demonstrates severe posterior pelvic tilt, L thoracic/R lumbar scoliosis, and R anterior/L posterior pelvic obliquity.    Time  5    Period  Weeks    Status  Partially Met    Target Date  12/20/18      PT SHORT TERM GOAL #2   Title  Patient will demonstrate a coordinated contraction, relaxation, and bulge of the pelvic floor muscles to demonstrate functional recruitment and motion and allow for bladder emptying.    Baseline  Pt. is unable to fully empty bladder and describes pain with catheterization, demonstrating high tone of PFM.    Time  5    Period  Weeks    Status  Achieved    Target Date  12/20/18      PT SHORT TERM GOAL #3   Title  Patient will demonstrate improved sitting and standing posture to demonstrate learning and decrease stress on the pelvic floor with functional activity.    Baseline  Pt. demonstrates severe posterior pelvic tilt, L thoracic/R lumbar scoliosis, and R anterior/L posterior pelvic obliquity.    Time  5    Period  Weeks    Status  Partially Met    Target Date  12/20/18        PT Long Term Goals - 01/04/19 1523      PT LONG TERM GOAL #1   Title  Patient will describe pain no greater than 3/10 during Sitting for 2 hours to demonstrate improved functional ability.    Baseline  Max 9/10, worst with prolonged sitting. (Pt. ignoring pain at initial eval)    Time  10    Period  Weeks    Status  On-going    Target Date   02/09/19      PT LONG TERM GOAL #2   Title  Pt. will report walking for 30 min. without an increase in pain >2/10 to allow for return to exercise and demonstrate improved function.    Baseline  walking ~ 10 min with pain ~ 6/10    Time  10    Period  Weeks    Status  On-going    Target Date  02/09/19      PT LONG TERM GOAL #3   Title  Pt. Will be able to cath without pain and will be cleared by physiscian to d/c self-cath due to decreased residual volume with bladder emptying.    Baseline  required to cath 3 times per day, painful each time.    Time  10    Period  Weeks    Status  On-going    Target Date  02/09/19      PT LONG TERM GOAL #4   Title  Patient will score less than or equal to 40% on the Female NIH-CPSI and 30% on the [facility] to demonstrate a reduction in pain, urinary symptoms, and an improved quality of life.    Baseline  Female NIH-CPSI: 38/43 (88%) , PDI: 45/75 (60%)    Time  10    Period  Weeks    Status  New    Target Date  02/09/19            Plan - 01/25/19 1406    Clinical Impression Statement  Pt. Responded well to all interventions today, demonstrating improved spinal alignment and decreased spasm as well as understanding and correct performance of all education and exercises provided today. They will continue to benefit from skilled physical therapy to work toward remaining goals and maximize function as well as decrease likelihood of symptom increase or recurrence.     Personal Factors and Comorbidities  Age;Comorbidity 3+    Comorbidities  Osteoporosis, scoliosis, Arthritis, COPD, recent lumbosacral fusion for spondylolisthesis.    Examination-Activity Limitations  Toileting;Sit;Stand;Bend;Lift;Carry    Examination-Participation Restrictions  Interpersonal Relationship;Yard Work;Cleaning;Laundry    Stability/Clinical Decision Making  Unstable/Unpredictable    Rehab Potential  Good    PT Frequency  2x / week    PT Duration  Other (comment)   10 weeks    PT Treatment/Interventions  ADLs/Self Care Home Management;Aquatic Therapy;Moist Heat;Electrical Stimulation;Traction;Therapeutic activities;Functional mobility training;Stair training;Gait training;Therapeutic exercise;Balance training;Neuromuscular re-education;Patient/family education;Manual techniques;Dry needling;Passive range of motion;Scar mobilization;Taping    PT Next Visit Plan  Scoliosis management! (L1 seems to be key) manual work around R shoulder TPDN to L hamstrings (prone near biceps femoris) hip flexor PRN, back, review pelvic tilts, teapot,  and TA in mod-quad, MET correction etc.  Internal TP release to R PRN, scar mobility at 7 o'clock?.    PT Home Exercise Plan  diaphragmatic breathing, hip-flexor stretch, side-stretch, pelvic tilts in seated, TA in mod-quad, diaphragmatic breathing, supine butterfly, bow-and-arrow, posterior fourchette massage, urge-suppression, squatty potty, doorway chest-stretch, arm ABD slides, biased lumbar twist stretch    Consulted and Agree with Plan of Care  Patient       Patient will benefit from skilled therapeutic intervention in order to improve the following deficits and impairments:  Abnormal gait, Decreased balance, Increased muscle spasms, Decreased range of motion, Decreased scar mobility, Improper body mechanics, Decreased coordination, Decreased strength, Increased fascial restricitons, Impaired flexibility, Postural dysfunction, Pain  Visit Diagnosis: Abnormal posture  Other muscle spasm     Problem List Patient Active Problem List   Diagnosis Date Noted  . Chronic UTI 12/06/2018  . Status post lumbar surgery 12/06/2018  . Spondylolisthesis, lumbar region 09/26/2018  . Aortic atherosclerosis ([place]) 08/31/2018  . CAD (coronary artery disease) 02/23/2018  . Fatigue 01/20/2018  . Short of breath on exertion 10/17/2017  . Advanced care planning/counseling discussion 03/28/2017  . Varicose veins of both lower extremities with  complications 17/49/4496  .  Arthritis 12/09/2016  . Vaginal atrophy 09/10/2015  . Menopausal hot flushes 09/10/2015  . Menopause 09/10/2015  . Onychomycosis due to dermatophyte 04/07/2015  . Bursitis of right shoulder 02/26/2015  . Environmental and seasonal allergies 02/26/2015  . Other allergic rhinitis 02/26/2015  . Osteoporosis 11/26/2014  . COPD (chronic obstructive pulmonary disease) ([place]) 11/26/2014  . Fuchs' corneal dystrophy 11/26/2014  . Essential (primary) hypertension 11/26/2014  . Hyperlipidemia 11/26/2014  . Chronic kidney disease, stage III (moderate) ([place]) 11/26/2014  . Hypothyroidism 11/26/2014  . Low back pain 11/26/2014  . Endothelial corneal dystrophy 11/26/2014   Willa Rough DPT, ATC Willa Rough 01/25/2019, 2:31 PM  [place] MAIN [facility] SERVICES [address] [place], [place], [address] Phone: [phone]   Fax:  [phone]  Name: Holly C Orosz MRN: [id number] Date of Birth: 1946-03-16

## 2019-01-25 NOTE — Patient Instructions (Signed)
     Bring both knees up to your chest and then hold the one farthest from the edge of the table/bed and let the other relax toward the floor until stretch is felt through the front of the hip.   Hold-relax: Gently lift the knee of the down leg up ~ 1/2 and inch and hold for 5 seconds, then let it relax all the way for a second and repeat 4 more times to decrease resting tension in the muscle.   Stretch: Let the leg relax and feel a stretch across the front of the hip/thigh as you take belly breaths. Hold for __5__ deep belly breaths. Relax. Repeat __2-3__ times per side.    Do this __1-2__ times per day.  Focus on bow-and arrow rotations.   Use TENS unit like pictured (in your phone) at a level that creates a "strong tingle" but not pain or muscle contraction for 30 min at least 2 times per day. (you can use it more if you feel it is helping) Try emptying your bladder while you have it on and note if increased output.

## 2019-01-30 ENCOUNTER — Other Ambulatory Visit: Payer: Self-pay

## 2019-01-30 ENCOUNTER — Ambulatory Visit: Payer: Medicare Other

## 2019-01-30 DIAGNOSIS — R293 Abnormal posture: Secondary | ICD-10-CM | POA: Diagnosis not present

## 2019-01-30 DIAGNOSIS — M62838 Other muscle spasm: Secondary | ICD-10-CM

## 2019-01-30 NOTE — Therapy (Signed)
[place] MAIN [facility] SERVICES [address] [place], [place], [address] Phone: [phone]   Fax:  [phone]  Physical Therapy Treatment  The patient has been informed of current processes in place at Outpatient Rehab to protect patients from Covid-19 exposure including social distancing, schedule modifications, and new cleaning procedures. After discussing their particular risk with a therapist based on the patient's personal risk factors, the patient has decided to proceed with in-person therapy.   Patient Details  Name: Julia Deleon MRN: [id number] Date of Birth: 1945-12-17 Referring Provider (PT): Dr. Maryan Puls   Encounter Date: 01/30/2019  PT End of Session - 01/30/19 1610    Visit Number  20    Number of Visits  23    Date for PT Re-Evaluation  02/09/19    Authorization Type  Medicare    Authorization Time Period  through 02/09/19    Authorization - Visit Number  17    Authorization - Number of Visits  20    PT Start Time  4818    PT Stop Time  1640    PT Time Calculation (min)  60 min    Activity Tolerance  Patient tolerated treatment well;No increased pain    Behavior During Therapy  WFL for tasks assessed/performed       Past Medical History:  Diagnosis Date  . Arthritis   . COPD (chronic obstructive pulmonary disease) ([place])   . Family history of adverse reaction to anesthesia    father was slow to wake up  . Fuch's endothelial dystrophy   . Hyperlipidemia   . Hypothyroidism   . PONV (postoperative nausea and vomiting)    slow to wake up and PONV    Past Surgical History:  Procedure Laterality Date  . APPENDECTOMY    . BACK SURGERY  09/26/2018   L4-5 PLIF by Dr. Arnoldo Morale  . CARDIAC CATHETERIZATION    . CHOLECYSTECTOMY    . EYE SURGERY    . FOOT SURGERY Right    pin removed left  . laser vein surgery    . NASAL SINUS SURGERY    . RIGHT/LEFT HEART CATH AND CORONARY ANGIOGRAPHY N/A 12/06/2017   Procedure:  RIGHT/LEFT HEART CATH AND CORONARY ANGIOGRAPHY;  Surgeon: Yolonda Kida, MD;  Location: [place] [facility];  Service: Cardiovascular;  Laterality: N/A;  . TONSILLECTOMY      There were no vitals filed for this visit.    Pelvic Floor Physical Therapy Treatment and Progress Note  SCREENING  Changes in medications, allergies, or medical history?: none  SUBJECTIVE  Patient reports: Used TENS unit 2 times and both times it increased her pain and leg cramps to the point that she could not sleep. She now remembers that when she was getting therapy before that when the E-stim was put on the upper leg it also increased her pain but she is able to have it done to her back and hips without pain.  Precautions:  Vaginal atrophy, sciatica, osteoporosis  Pain update:  Location of pain: B hips lateral Current pain:  7/10  Max pain:  8/10 Least pain:  4/10  *7/10 following treatment with some pain into the knee that was not present before.  Patient Goals: Improve bladder function, decrease back pain and increase strength and balance.   OBJECTIVE  Changes in:  Observation/posture: L1 vertebrae spinous process is prominent and L deviated >T12 and L2,L3.   ROM/Mobility: Decreased mobility through T-L junction, L rotation through trunk  only ~ 10 deg, R~15 degrees (not progressing as expected with bow-and-arrow)  Palpation: TTP to  L L1, L2 multifidus L Semitendinosus     INTERVENTIONS THIS SESSION: Manual: Performed TP release and STM to  L L1, L2 multifidus L Semitendinosus to decrease spasm and pain and allow for improved balance of musculature for improved function and decreased symptoms and MWM at L1-2 in side-lying as well as L to R grade 3-4 mobs to improve mobility of joint and surrounding connective tissue and decrease pressure on nerve roots for improved conductivity and function of down-stream tissues.  Dry-needle: Performed TPDN with a .30x49m needle as described below  to decrease spasm and pain and allow for improved balance of musculature for improved function and decreased symptoms.  Therex: Educated on and practiced doing a long duration stretch following bow-and-arrow rotations to allow for more tissue length and ability to get to the joint from true mobilization.   Total time: 60 min.                          Trigger Point Dry Needling - 01/30/19 0001    Consent Given?  Yes    Education Handout Provided  No    Muscles Treated Lower Quadrant  Hamstring    Muscles Treated Back/Hip  Erector spinae;Lumbar multifidi    Dry Needling Comments  L Semitendonosis, L L1 and L2    Erector spinae Response  Twitch response elicited;Palpable increased muscle length    Lumbar multifidi Response  Twitch response elicited;Palpable increased muscle length             PT Short Term Goals - 01/04/19 1522      PT SHORT TERM GOAL #1   Title  Patient will demonstrate improved pelvic alignment and balance of musculature surrounding the pelvis to facilitate decreased PFM spasms and decrease pelvic pain.    Baseline  Pt. demonstrates severe posterior pelvic tilt, L thoracic/R lumbar scoliosis, and R anterior/L posterior pelvic obliquity.    Time  5    Period  Weeks    Status  Partially Met    Target Date  12/20/18      PT SHORT TERM GOAL #2   Title  Patient will demonstrate a coordinated contraction, relaxation, and bulge of the pelvic floor muscles to demonstrate functional recruitment and motion and allow for bladder emptying.    Baseline  Pt. is unable to fully empty bladder and describes pain with catheterization, demonstrating high tone of PFM.    Time  5    Period  Weeks    Status  Achieved    Target Date  12/20/18      PT SHORT TERM GOAL #3   Title  Patient will demonstrate improved sitting and standing posture to demonstrate learning and decrease stress on the pelvic floor with functional activity.    Baseline  Pt. demonstrates  severe posterior pelvic tilt, L thoracic/R lumbar scoliosis, and R anterior/L posterior pelvic obliquity.    Time  5    Period  Weeks    Status  Partially Met    Target Date  12/20/18        PT Long Term Goals - 01/04/19 1523      PT LONG TERM GOAL #1   Title  Patient will describe pain no greater than 3/10 during Sitting for 2 hours to demonstrate improved functional ability.    Baseline  Max 9/10, worst with prolonged sitting. (Pt.  ignoring pain at initial eval)    Time  10    Period  Weeks    Status  On-going    Target Date  02/09/19      PT LONG TERM GOAL #2   Title  Pt. will report walking for 30 min. without an increase in pain >2/10 to allow for return to exercise and demonstrate improved function.    Baseline  walking ~ 10 min with pain ~ 6/10    Time  10    Period  Weeks    Status  On-going    Target Date  02/09/19      PT LONG TERM GOAL #3   Title  Pt. Will be able to cath without pain and will be cleared by physiscian to d/c self-cath due to decreased residual volume with bladder emptying.    Baseline  required to cath 3 times per day, painful each time.    Time  10    Period  Weeks    Status  On-going    Target Date  02/09/19      PT LONG TERM GOAL #4   Title  Patient will score less than or equal to 40% on the Female NIH-CPSI and 30% on the [facility] to demonstrate a reduction in pain, urinary symptoms, and an improved quality of life.    Baseline  Female NIH-CPSI: 38/43 (88%) , PDI: 45/75 (60%)    Time  10    Period  Weeks    Status  New    Target Date  02/09/19            Plan - 01/30/19 1611    Clinical Impression Statement  Pt. Responded well to all interventions today, demonstrating slightly improved mobility and decreased spasms at L lumbo-thoracic junction as well as understanding and correct performance of all education and exercises provided today. They will continue to benefit from skilled physical therapy to work toward remaining goals and maximize  function as well as decrease likelihood of symptom increase or recurrence.     Personal Factors and Comorbidities  Age;Comorbidity 3+    Comorbidities  Osteoporosis, scoliosis, Arthritis, COPD, recent lumbosacral fusion for spondylolisthesis.    Examination-Activity Limitations  Toileting;Sit;Stand;Bend;Lift;Carry    Examination-Participation Restrictions  Interpersonal Relationship;Yard Work;Cleaning;Laundry    Stability/Clinical Decision Making  Unstable/Unpredictable    Rehab Potential  Good    PT Frequency  2x / week    PT Duration  Other (comment)   10 weeks   PT Treatment/Interventions  ADLs/Self Care Home Management;Aquatic Therapy;Moist Heat;Electrical Stimulation;Traction;Therapeutic activities;Functional mobility training;Stair training;Gait training;Therapeutic exercise;Balance training;Neuromuscular re-education;Patient/family education;Manual techniques;Dry needling;Passive range of motion;Scar mobilization;Taping    PT Next Visit Plan  Scoliosis management! (L1 seems to be key) manual work around R shoulder TPDN to L hamstrings (prone near biceps femoris) hip flexor PRN, back, review pelvic tilts, teapot,  and TA in mod-quad, MET correction etc.  Internal TP release to R PRN, scar mobility at 7 o'clock?.    PT Home Exercise Plan  diaphragmatic breathing, hip-flexor stretch, side-stretch, pelvic tilts in seated, TA in mod-quad, diaphragmatic breathing, supine butterfly, bow-and-arrow, posterior fourchette massage, urge-suppression, squatty potty, doorway chest-stretch, arm ABD slides, biased lumbar twist stretch    Consulted and Agree with Plan of Care  Patient       Patient will benefit from skilled therapeutic intervention in order to improve the following deficits and impairments:  Abnormal gait, Decreased balance, Increased muscle spasms, Decreased range of motion, Decreased scar mobility, Improper  body mechanics, Decreased coordination, Decreased strength, Increased fascial  restricitons, Impaired flexibility, Postural dysfunction, Pain  Visit Diagnosis: Abnormal posture  Other muscle spasm     Problem List Patient Active Problem List   Diagnosis Date Noted  . Chronic UTI 12/06/2018  . Status post lumbar surgery 12/06/2018  . Spondylolisthesis, lumbar region 09/26/2018  . Aortic atherosclerosis ([place]) 08/31/2018  . CAD (coronary artery disease) 02/23/2018  . Fatigue 01/20/2018  . Short of breath on exertion 10/17/2017  . Advanced care planning/counseling discussion 03/28/2017  . Varicose veins of both lower extremities with complications 97/06/6376  . Arthritis 12/09/2016  . Vaginal atrophy 09/10/2015  . Menopausal hot flushes 09/10/2015  . Menopause 09/10/2015  . Onychomycosis due to dermatophyte 04/07/2015  . Bursitis of right shoulder 02/26/2015  . Environmental and seasonal allergies 02/26/2015  . Other allergic rhinitis 02/26/2015  . Osteoporosis 11/26/2014  . COPD (chronic obstructive pulmonary disease) ([place]) 11/26/2014  . Fuchs' corneal dystrophy 11/26/2014  . Essential (primary) hypertension 11/26/2014  . Hyperlipidemia 11/26/2014  . Chronic kidney disease, stage III (moderate) ([place]) 11/26/2014  . Hypothyroidism 11/26/2014  . Low back pain 11/26/2014  . Endothelial corneal dystrophy 11/26/2014   Willa Rough DPT, ATC Willa Rough 01/30/2019, 5:03 PM  [place] MAIN [facility] SERVICES [address] [place], [place], [address] Phone: [phone]   Fax:  [phone]  Name: Julia Deleon MRN: [id number] Date of Birth: 04/03/1946

## 2019-01-30 NOTE — Patient Instructions (Signed)
   When you finish 15 rotations, hold in this rotated position and breathe for 5 deep breaths.

## 2019-02-01 DIAGNOSIS — J301 Allergic rhinitis due to pollen: Secondary | ICD-10-CM | POA: Diagnosis not present

## 2019-02-02 ENCOUNTER — Ambulatory Visit: Payer: Medicare Other

## 2019-02-02 ENCOUNTER — Other Ambulatory Visit: Payer: Self-pay

## 2019-02-02 DIAGNOSIS — R293 Abnormal posture: Secondary | ICD-10-CM

## 2019-02-02 DIAGNOSIS — M62838 Other muscle spasm: Secondary | ICD-10-CM | POA: Diagnosis not present

## 2019-02-02 NOTE — Patient Instructions (Signed)
   Talk to Dr. Gillis Santa (Pain Medicine)   I will write my note with recommendation, you can ask your GP to send a referral in for you if he deems appropriate.

## 2019-02-02 NOTE — Therapy (Addendum)
[place] MAIN [facility] SERVICES [address] [place], [place], [address] Phone: [phone]   Fax:  [phone]  Physical Therapy Treatment  The patient has been informed of current processes in place at Outpatient Rehab to protect patients from Covid-19 exposure including social distancing, schedule modifications, and new cleaning procedures. After discussing their particular risk with a therapist based on the patient's personal risk factors, the patient has decided to proceed with in-person therapy.   Patient Details  Name: Julia Deleon MRN: [id number] Date of Birth: [birth year] Referring Provider (PT): Dr. Maryan Puls   Encounter Date: 02/02/2019  PT End of Session - 02/05/19 0814    Visit Number  21    Number of Visits  23    Date for PT Re-Evaluation  02/09/19    Authorization Type  Medicare    Authorization Time Period  through 02/09/19    Authorization - Visit Number  18    Authorization - Number of Visits  20    PT Start Time  0900    PT Stop Time  1000    PT Time Calculation (min)  60 min    Activity Tolerance  Patient tolerated treatment well;No increased pain    Behavior During Therapy  WFL for tasks assessed/performed       Past Medical History:  Diagnosis Date  . Arthritis   . COPD (chronic obstructive pulmonary disease) ([place])   . Family history of adverse reaction to anesthesia    father was slow to wake up  . Fuch's endothelial dystrophy   . Hyperlipidemia   . Hypothyroidism   . PONV (postoperative nausea and vomiting)    slow to wake up and PONV    Past Surgical History:  Procedure Laterality Date  . APPENDECTOMY    . BACK SURGERY  09/26/2018   L4-5 PLIF by Dr. Arnoldo Morale  . CARDIAC CATHETERIZATION    . CHOLECYSTECTOMY    . EYE SURGERY    . FOOT SURGERY Right    pin removed left  . laser vein surgery    . NASAL SINUS SURGERY    . RIGHT/LEFT HEART CATH AND CORONARY ANGIOGRAPHY N/A 12/06/2017   Procedure:  RIGHT/LEFT HEART CATH AND CORONARY ANGIOGRAPHY;  Surgeon: Yolonda Kida, MD;  Location: [place] [facility];  Service: Cardiovascular;  Laterality: N/A;  . TONSILLECTOMY      There were no vitals filed for this visit.   Pelvic Floor Physical Therapy Treatment and Progress Note  SCREENING  Changes in medications, allergies, or medical history?: none  SUBJECTIVE  Patient reports: Spoke with her chiropractor and he suggested that she try acupuncture. She went and was able to void afterward. It helped her pain too. She remembers falling when she was in her 40's and fell on the L leg. Had a herniated disk in 2001 and did not have surgery until 2020. The L leg seemed to start bothering her in 2015-16.    Precautions:  Vaginal atrophy, sciatica, osteoporosis  Pain update:  Location of pain: B hips lateral Current pain:  6/10  Max pain:  7/10 Least pain:  4/10  *3/10 following treatment  Patient Goals: Improve bladder function, decrease back pain and increase strength and balance.   OBJECTIVE  Changes in:  Observation/posture: L posterior/R anterior rotation   Palpation: TTP to B deep hip flexors.   INTERVENTIONS THIS SESSION: Manual: Performed MET correction x1, MWM, and rotational mobs to B ilium in opposite directions to correct  L posterior/R anterior rotation and improve pelvic alignment for decreased imbalance of pressure on nerves and spasms causing pain. Self-care: Discussed limited progress and potential peripheral nerve injury in the LLE evidenced by her increased pain with application of TENS distal to the hip. Discussed Acupuncture and referral to a pain specialist to see what procedures she may qualify for.  Total time: 60 min.                             PT Short Term Goals - 01/04/19 1522      PT SHORT TERM GOAL #1   Title  Patient will demonstrate improved pelvic alignment and balance of musculature surrounding the pelvis  to facilitate decreased PFM spasms and decrease pelvic pain.    Baseline  Pt. demonstrates severe posterior pelvic tilt, L thoracic/R lumbar scoliosis, and R anterior/L posterior pelvic obliquity.    Time  5    Period  Weeks    Status  Partially Met    Target Date  12/20/18      PT SHORT TERM GOAL #2   Title  Patient will demonstrate a coordinated contraction, relaxation, and bulge of the pelvic floor muscles to demonstrate functional recruitment and motion and allow for bladder emptying.    Baseline  Pt. is unable to fully empty bladder and describes pain with catheterization, demonstrating high tone of PFM.    Time  5    Period  Weeks    Status  Achieved    Target Date  12/20/18      PT SHORT TERM GOAL #3   Title  Patient will demonstrate improved sitting and standing posture to demonstrate learning and decrease stress on the pelvic floor with functional activity.    Baseline  Pt. demonstrates severe posterior pelvic tilt, L thoracic/R lumbar scoliosis, and R anterior/L posterior pelvic obliquity.    Time  5    Period  Weeks    Status  Partially Met    Target Date  12/20/18        PT Long Term Goals - 01/04/19 1523      PT LONG TERM GOAL #1   Title  Patient will describe pain no greater than 3/10 during Sitting for 2 hours to demonstrate improved functional ability.    Baseline  Max 9/10, worst with prolonged sitting. (Pt. ignoring pain at initial eval)    Time  10    Period  Weeks    Status  On-going    Target Date  02/09/19      PT LONG TERM GOAL #2   Title  Pt. will report walking for 30 min. without an increase in pain >2/10 to allow for return to exercise and demonstrate improved function.    Baseline  walking ~ 10 min with pain ~ 6/10    Time  10    Period  Weeks    Status  On-going    Target Date  02/09/19      PT LONG TERM GOAL #3   Title  Pt. Will be able to cath without pain and will be cleared by physiscian to d/c self-cath due to decreased residual volume  with bladder emptying.    Baseline  required to cath 3 times per day, painful each time.    Time  10    Period  Weeks    Status  On-going    Target Date  02/09/19  PT LONG TERM GOAL #4   Title  Patient will score less than or equal to 40% on the Female NIH-CPSI and 30% on the [facility] to demonstrate a reduction in pain, urinary symptoms, and an improved quality of life.    Baseline  Female NIH-CPSI: 38/43 (88%) , PDI: 45/75 (60%)    Time  10    Period  Weeks    Status  New    Target Date  02/09/19            Plan - 02/05/19 0815    Clinical Impression Statement  Pt. Responded well to all interventions today, demonstrating improved Pain from 7/10 to 3/10 following re-alignment of the pelvis as well as understanding and correct performance of all education and exercises provided today. She has made some progress to date but continues to have increased pain return when she goes to sleep. Pt. And PT agree that she needs a multi-disciplinary approach to achieve maximal improvement. They will continue to benefit from skilled physical therapy to work toward remaining goals and maximize function as well as decrease likelihood of symptom increase or recurrence.     Personal Factors and Comorbidities  Age;Comorbidity 3+    Comorbidities  Osteoporosis, scoliosis, Arthritis, COPD, recent lumbosacral fusion for spondylolisthesis.    Examination-Activity Limitations  Toileting;Sit;Stand;Bend;Lift;Carry    Examination-Participation Restrictions  Interpersonal Relationship;Yard Work;Cleaning;Laundry    Stability/Clinical Decision Making  Unstable/Unpredictable    Rehab Potential  Good    PT Frequency  2x / week    PT Duration  Other (comment)   10 weeks   PT Treatment/Interventions  ADLs/Self Care Home Management;Aquatic Therapy;Moist Heat;Electrical Stimulation;Traction;Therapeutic activities;Functional mobility training;Stair training;Gait training;Therapeutic exercise;Balance  training;Neuromuscular re-education;Patient/family education;Manual techniques;Dry needling;Passive range of motion;Scar mobilization;Taping    PT Next Visit Plan  Re-try TPDN to L hamstrings (prone near biceps femoris) hip flexor PRN following pelvic alignment,  review pelvic tilts, teapot,  and TA in mod-quad, Internal TP release to R PRN, scar mobility at 7 o'clock?.    PT Home Exercise Plan  diaphragmatic breathing, hip-flexor stretch, side-stretch, pelvic tilts in seated, TA in mod-quad, diaphragmatic breathing, supine butterfly, bow-and-arrow, posterior fourchette massage, urge-suppression, squatty potty, doorway chest-stretch, arm ABD slides, biased lumbar twist stretch    Consulted and Agree with Plan of Care  Patient       Patient will benefit from skilled therapeutic intervention in order to improve the following deficits and impairments:  Abnormal gait, Decreased balance, Increased muscle spasms, Decreased range of motion, Decreased scar mobility, Improper body mechanics, Decreased coordination, Decreased strength, Increased fascial restricitons, Impaired flexibility, Postural dysfunction, Pain  Visit Diagnosis: Abnormal posture  Other muscle spasm     Problem List Patient Active Problem List   Diagnosis Date Noted  . Chronic UTI 12/06/2018  . Status post lumbar surgery 12/06/2018  . Spondylolisthesis, lumbar region 09/26/2018  . Aortic atherosclerosis ([place]) 08/31/2018  . CAD (coronary artery disease) 02/23/2018  . Fatigue 01/20/2018  . Short of breath on exertion 10/17/2017  . Advanced care planning/counseling discussion 03/28/2017  . Varicose veins of both lower extremities with complications 92/42/6834  . Arthritis 12/09/2016  . Vaginal atrophy 09/10/2015  . Menopausal hot flushes 09/10/2015  . Menopause 09/10/2015  . Onychomycosis due to dermatophyte 04/07/2015  . Bursitis of right shoulder 02/26/2015  . Environmental and seasonal allergies 02/26/2015  . Other  allergic rhinitis 02/26/2015  . Osteoporosis 11/26/2014  . COPD (chronic obstructive pulmonary disease) ([place]) 11/26/2014  . Fuchs' corneal dystrophy 11/26/2014  .  Essential (primary) hypertension 11/26/2014  . Hyperlipidemia 11/26/2014  . Chronic kidney disease, stage III (moderate) ([place]) 11/26/2014  . Hypothyroidism 11/26/2014  . Low back pain 11/26/2014  . Endothelial corneal dystrophy 11/26/2014   Willa Rough DPT, ATC Willa Rough 02/05/2019, 8:27 AM  [place] MAIN [facility] SERVICES [address] [place], [place], [address] Phone: [phone]   Fax:  [phone]  Name: Julia Deleon MRN: [id number] Date of Birth: [birth year]

## 2019-02-06 ENCOUNTER — Other Ambulatory Visit: Payer: Self-pay

## 2019-02-06 ENCOUNTER — Ambulatory Visit: Payer: Medicare Other

## 2019-02-06 DIAGNOSIS — M62838 Other muscle spasm: Secondary | ICD-10-CM

## 2019-02-06 DIAGNOSIS — R293 Abnormal posture: Secondary | ICD-10-CM | POA: Diagnosis not present

## 2019-02-06 NOTE — Therapy (Addendum)
[place] MAIN [facility] SERVICES [address] [place], [place], [address] Phone: [phone]   Fax:  [phone]  Physical Therapy Treatment  The patient has been informed of current processes in place at Outpatient Rehab to protect patients from Covid-19 exposure including social distancing, schedule modifications, and new cleaning procedures. After discussing their particular risk with a therapist based on the patient's personal risk factors, the patient has decided to proceed with in-person therapy.   Patient Details  Name: Julia Deleon MRN: [id number] Date of Birth: 1945/08/27 Referring Provider (PT): Dr. Maryan Puls   Encounter Date: 02/06/2019  PT End of Session - 02/08/19 1757    Visit Number  22    Number of Visits  23    Date for PT Re-Evaluation  02/09/19    Authorization Type  Medicare    Authorization Time Period  through 02/09/19    Authorization - Visit Number  19    Authorization - Number of Visits  20    PT Start Time  3354    PT Stop Time  1630    PT Time Calculation (min)  60 min    Activity Tolerance  Patient tolerated treatment well;No increased pain    Behavior During Therapy  WFL for tasks assessed/performed       Past Medical History:  Diagnosis Date  . Arthritis   . COPD (chronic obstructive pulmonary disease) ([place])   . Family history of adverse reaction to anesthesia    father was slow to wake up  . Fuch's endothelial dystrophy   . Hyperlipidemia   . Hypothyroidism   . PONV (postoperative nausea and vomiting)    slow to wake up and PONV    Past Surgical History:  Procedure Laterality Date  . APPENDECTOMY    . BACK SURGERY  09/26/2018   L4-5 PLIF by Dr. Arnoldo Morale  . CARDIAC CATHETERIZATION    . CHOLECYSTECTOMY    . EYE SURGERY    . FOOT SURGERY Right    pin removed left  . laser vein surgery    . NASAL SINUS SURGERY    . RIGHT/LEFT HEART CATH AND CORONARY ANGIOGRAPHY N/A 12/06/2017   Procedure:  RIGHT/LEFT HEART CATH AND CORONARY ANGIOGRAPHY;  Surgeon: Yolonda Kida, MD;  Location: [place] [facility];  Service: Cardiovascular;  Laterality: N/A;  . TONSILLECTOMY      There were no vitals filed for this visit.     Pelvic Floor Physical Therapy Treatment and Progress Note  SCREENING  Changes in medications, allergies, or medical history?: none  SUBJECTIVE  Patient reports: Does not feel like she got as good of results from the most recent acupuncture as she did from the prior two sessions. Was very active yesterday (Monday). Pain was only down until she went back to bed after the last visit. Was not able to urinate at lunch time today, after acupuncture.  Precautions:  Vaginal atrophy, sciatica, osteoporosis  Pain update:  Location of pain: B hips lateral Current pain:  6/10  Max pain:  7/10 Least pain:  3/10  *Pt. Describes "feeling better" following treatment though her pain number reported was 6/10  Patient Goals: Improve bladder function, decrease back pain and increase strength and balance.   OBJECTIVE  Changes in:  Observation/posture: L posterior/R anterior rotation   Palpation: TTP to B deep hip flexors.   INTERVENTIONS THIS SESSION: Manual: Performed MET correction x1, MWM, and rotational mobs to B ilium in opposite directions to  correct L posterior/R anterior rotation and improve pelvic alignment for decreased imbalance of pressure on nerves and spasms causing pain. Self-care: Discussed limited progress and potential peripheral nerve injury in the LLE evidenced by her increased pain with application of TENS distal to the hip. Discussed Acupuncture and referral to a pain specialist to see what procedures she may qualify for.  Total time: 60 min.                    Trigger Point Dry Needling - 02/08/19 0001    Consent Given?  Yes    Education Handout Provided  No    Muscles Treated Lower Quadrant  Vastus lateralis     Muscles Treated Back/Hip  Iliacus;Quadratus lumborum    Dry Needling Comments  and  L TFL              PT Short Term Goals - 01/04/19 1522      PT SHORT TERM GOAL #1   Title  Patient will demonstrate improved pelvic alignment and balance of musculature surrounding the pelvis to facilitate decreased PFM spasms and decrease pelvic pain.    Baseline  Pt. demonstrates severe posterior pelvic tilt, L thoracic/R lumbar scoliosis, and R anterior/L posterior pelvic obliquity.    Time  5    Period  Weeks    Status  Partially Met    Target Date  12/20/18      PT SHORT TERM GOAL #2   Title  Patient will demonstrate a coordinated contraction, relaxation, and bulge of the pelvic floor muscles to demonstrate functional recruitment and motion and allow for bladder emptying.    Baseline  Pt. is unable to fully empty bladder and describes pain with catheterization, demonstrating high tone of PFM.    Time  5    Period  Weeks    Status  Achieved    Target Date  12/20/18      PT SHORT TERM GOAL #3   Title  Patient will demonstrate improved sitting and standing posture to demonstrate learning and decrease stress on the pelvic floor with functional activity.    Baseline  Pt. demonstrates severe posterior pelvic tilt, L thoracic/R lumbar scoliosis, and R anterior/L posterior pelvic obliquity.    Time  5    Period  Weeks    Status  Partially Met    Target Date  12/20/18        PT Long Term Goals - 01/04/19 1523      PT LONG TERM GOAL #1   Title  Patient will describe pain no greater than 3/10 during Sitting for 2 hours to demonstrate improved functional ability.    Baseline  Max 9/10, worst with prolonged sitting. (Pt. ignoring pain at initial eval)    Time  10    Period  Weeks    Status  On-going    Target Date  02/09/19      PT LONG TERM GOAL #2   Title  Pt. will report walking for 30 min. without an increase in pain >2/10 to allow for return to exercise and demonstrate improved  function.    Baseline  walking ~ 10 min with pain ~ 6/10    Time  10    Period  Weeks    Status  On-going    Target Date  02/09/19      PT LONG TERM GOAL #3   Title  Pt. Will be able to cath without pain and will be cleared by  physiscian to d/c self-cath due to decreased residual volume with bladder emptying.    Baseline  required to cath 3 times per day, painful each time.    Time  10    Period  Weeks    Status  On-going    Target Date  02/09/19      PT LONG TERM GOAL #4   Title  Patient will score less than or equal to 40% on the Female NIH-CPSI and 30% on the [facility] to demonstrate a reduction in pain, urinary symptoms, and an improved quality of life.    Baseline  Female NIH-CPSI: 38/43 (88%) , PDI: 45/75 (60%)    Time  10    Period  Weeks    Status  New    Target Date  02/09/19            Plan - 02/08/19 1757    Clinical Impression Statement  Pt. Responded well to all interventions today, demonstrating decreased spasms and improved pelvic alignment as well as understanding and correct performance of all education and exercises provided today. They will continue to benefit from skilled physical therapy to work toward remaining goals and maximize function as well as decrease likelihood of symptom increase or recurrence.     Personal Factors and Comorbidities  Age;Comorbidity 3+    Comorbidities  Osteoporosis, scoliosis, Arthritis, COPD, recent lumbosacral fusion for spondylolisthesis.    Examination-Activity Limitations  Toileting;Sit;Stand;Bend;Lift;Carry    Examination-Participation Restrictions  Interpersonal Relationship;Yard Work;Cleaning;Laundry    Stability/Clinical Decision Making  Unstable/Unpredictable    Rehab Potential  Good    PT Frequency  2x / week    PT Duration  Other (comment)   10 weeks   PT Treatment/Interventions  ADLs/Self Care Home Management;Aquatic Therapy;Moist Heat;Electrical Stimulation;Traction;Therapeutic activities;Functional mobility  training;Stair training;Gait training;Therapeutic exercise;Balance training;Neuromuscular re-education;Patient/family education;Manual techniques;Dry needling;Passive range of motion;Scar mobilization;Taping    PT Next Visit Plan  Re-try TPDN to L hamstrings (prone near biceps femoris) hip flexor PRN following pelvic alignment,  review pelvic tilts, teapot,  and TA in mod-quad, Internal TP release to R PRN, scar mobility at 7 o'clock?.    PT Home Exercise Plan  diaphragmatic breathing, hip-flexor stretch, side-stretch, pelvic tilts in seated, TA in mod-quad, diaphragmatic breathing, supine butterfly, bow-and-arrow, posterior fourchette massage, urge-suppression, squatty potty, doorway chest-stretch, arm ABD slides, biased lumbar twist stretch    Consulted and Agree with Plan of Care  Patient       Patient will benefit from skilled therapeutic intervention in order to improve the following deficits and impairments:  Abnormal gait, Decreased balance, Increased muscle spasms, Decreased range of motion, Decreased scar mobility, Improper body mechanics, Decreased coordination, Decreased strength, Increased fascial restricitons, Impaired flexibility, Postural dysfunction, Pain  Visit Diagnosis: Abnormal posture  Other muscle spasm     Problem List Patient Active Problem List   Diagnosis Date Noted  . Chronic UTI 12/06/2018  . Status post lumbar surgery 12/06/2018  . Spondylolisthesis, lumbar region 09/26/2018  . Aortic atherosclerosis ([place]) 08/31/2018  . CAD (coronary artery disease) 02/23/2018  . Fatigue 01/20/2018  . Short of breath on exertion 10/17/2017  . Advanced care planning/counseling discussion 03/28/2017  . Varicose veins of both lower extremities with complications 38/46/6599  . Arthritis 12/09/2016  . Vaginal atrophy 09/10/2015  . Menopausal hot flushes 09/10/2015  . Menopause 09/10/2015  . Onychomycosis due to dermatophyte 04/07/2015  . Bursitis of right shoulder 02/26/2015   . Environmental and seasonal allergies 02/26/2015  . Other allergic rhinitis 02/26/2015  .  Osteoporosis 11/26/2014  . COPD (chronic obstructive pulmonary disease) ([place]) 11/26/2014  . Fuchs' corneal dystrophy 11/26/2014  . Essential (primary) hypertension 11/26/2014  . Hyperlipidemia 11/26/2014  . Chronic kidney disease, stage III (moderate) ([place]) 11/26/2014  . Hypothyroidism 11/26/2014  . Low back pain 11/26/2014  . Endothelial corneal dystrophy 11/26/2014   Julia Deleon DPT, ATC Julia Deleon 02/08/2019, 5:58 PM  [place] MAIN [facility] SERVICES [address] [place], [place], [address] Phone: [phone]   Fax:  [phone]  Name: Julia Deleon MRN: [id number] Date of Birth: 1945-10-19

## 2019-02-07 DIAGNOSIS — J439 Emphysema, unspecified: Secondary | ICD-10-CM | POA: Diagnosis not present

## 2019-02-07 DIAGNOSIS — J310 Chronic rhinitis: Secondary | ICD-10-CM | POA: Diagnosis not present

## 2019-02-08 ENCOUNTER — Ambulatory Visit: Payer: Medicare Other | Attending: Urology

## 2019-02-08 ENCOUNTER — Other Ambulatory Visit: Payer: Self-pay

## 2019-02-08 DIAGNOSIS — R293 Abnormal posture: Secondary | ICD-10-CM

## 2019-02-08 DIAGNOSIS — R2689 Other abnormalities of gait and mobility: Secondary | ICD-10-CM | POA: Insufficient documentation

## 2019-02-08 DIAGNOSIS — M62838 Other muscle spasm: Secondary | ICD-10-CM | POA: Insufficient documentation

## 2019-02-08 DIAGNOSIS — J301 Allergic rhinitis due to pollen: Secondary | ICD-10-CM | POA: Diagnosis not present

## 2019-02-08 NOTE — Patient Instructions (Addendum)
   1) with leg-lift, make sure you relax all the way before you start to press the knee down again and then lift while trying to keep the leg straight.   2) When walking think "core-tight, stand up right" and let your arms swing, only grab the hand-rails if you have to and take long steps.

## 2019-02-08 NOTE — Therapy (Signed)
[place] MAIN [facility] SERVICES [address] [place], [place], [address] Phone: [phone]   Fax:  [phone]  Physical Therapy Treatment  The patient has been informed of current processes in place at Outpatient Rehab to protect patients from Covid-19 exposure including social distancing, schedule modifications, and new cleaning procedures. After discussing their particular risk with a therapist based on the patient's personal risk factors, the patient has decided to proceed with in-person therapy.  Patient Details  Name: Julia Deleon MRN: [id number] Date of Birth: 04/28/1946 Referring Provider (PT): Dr. Maryan Puls   Encounter Date: 02/08/2019  PT End of Session - 02/08/19 1802    Visit Number  23    Number of Visits  23    Date for PT Re-Evaluation  02/09/19    Authorization Type  Medicare    Authorization Time Period  through 02/09/19    Authorization - Visit Number  20    Authorization - Number of Visits  20    PT Start Time  1640    PT Stop Time  1884    PT Time Calculation (min)  60 min    Activity Tolerance  Patient tolerated treatment well;No increased pain    Behavior During Therapy  WFL for tasks assessed/performed       Past Medical History:  Diagnosis Date  . Arthritis   . COPD (chronic obstructive pulmonary disease) ([place])   . Family history of adverse reaction to anesthesia    father was slow to wake up  . Fuch's endothelial dystrophy   . Hyperlipidemia   . Hypothyroidism   . PONV (postoperative nausea and vomiting)    slow to wake up and PONV    Past Surgical History:  Procedure Laterality Date  . APPENDECTOMY    . BACK SURGERY  09/26/2018   L4-5 PLIF by Dr. Arnoldo Morale  . CARDIAC CATHETERIZATION    . CHOLECYSTECTOMY    . EYE SURGERY    . FOOT SURGERY Right    pin removed left  . laser vein surgery    . NASAL SINUS SURGERY    . RIGHT/LEFT HEART CATH AND CORONARY ANGIOGRAPHY N/A 12/06/2017   Procedure: RIGHT/LEFT  HEART CATH AND CORONARY ANGIOGRAPHY;  Surgeon: Yolonda Kida, MD;  Location: [place] [facility];  Service: Cardiovascular;  Laterality: N/A;  . TONSILLECTOMY      There were no vitals filed for this visit.    Pelvic Floor Physical Therapy Treatment and Progress Note  SCREENING  Changes in medications, allergies, or medical history?: none  SUBJECTIVE  Patient reports: Pt went to acupuncturist today, and states she is feeling "a little better". Feels likes its a "slow go". Pt states she was able to go to the bathroom around noon today, which is new. Pt reports went to the gym M/W, walked on the treadmill (12-15 minutes) and light weights (machine and free weights).   Precautions:  Vaginal atrophy, sciatica, osteoporosis  Pain update:  Location of pain: B hips lateral Current pain:  5-6/10  Max pain:  7/10 Least pain:  3-4/10 Description: The B hip pain feels like "shooting pain down both legs", L>R.   *Following treatment Pt. Described less pain   Patient Goals: Improve bladder function, decrease back pain and increase strength and balance.   OBJECTIVE  Changes in:  Observation/posture: L posterior/R anterior rotation   Palpation: TTP to B deep hip flexors.   INTERVENTIONS THIS SESSION: Manual: Performed MET correction x1, MWM, and  rotational mobs to B ilium in opposite directions to correct L posterior/R anterior rotation and improve pelvic alignment for decreased imbalance of pressure on nerves and spasms causing pain. Self-care: Discussed limited progress and potential peripheral nerve injury in the LLE evidenced by her increased pain with application of TENS distal to the hip. Discussed Acupuncture and referral to a pain specialist to see what procedures she may qualify for.  Total time: 60 min.                            PT Short Term Goals - 02/09/19 1010      PT SHORT TERM GOAL #1   Title  Patient will demonstrate improved  pelvic alignment and balance of musculature surrounding the pelvis to facilitate decreased PFM spasms and decrease pelvic pain.    Baseline  Pt. demonstrates severe posterior pelvic tilt, L thoracic/R lumbar scoliosis, and R anterior/L posterior pelvic obliquity. Pt. has demonstrated improved pelvic alignment and posture but has not been able to sustain long-term due to continued lack of postural strength.    Time  5    Period  Weeks    Status  Partially Met    Target Date  12/20/18      PT SHORT TERM GOAL #2   Title  Patient will demonstrate a coordinated contraction, relaxation, and bulge of the pelvic floor muscles to demonstrate functional recruitment and motion and allow for bladder emptying.    Baseline  Pt. is unable to fully empty bladder and describes pain with catheterization, demonstrating high tone of PFM.    Time  5    Period  Weeks    Status  Achieved    Target Date  12/20/18      PT SHORT TERM GOAL #3   Title  Patient will demonstrate improved sitting and standing posture to demonstrate learning and decrease stress on the pelvic floor with functional activity.    Baseline  Pt. demonstrates severe posterior pelvic tilt, L thoracic/R lumbar scoliosis, and R anterior/L posterior pelvic obliquity. Pt is able to attain improved posture by ~ 85% but with fatigue reverts to poor posture.    Time  5    Period  Weeks    Status  Partially Met    Target Date  12/20/18        PT Long Term Goals - 02/09/19 0819      PT LONG TERM GOAL #1   Title  Patient will describe pain no greater than 3/10 during Sitting for 2 hours to demonstrate improved functional ability.    Baseline  Max 9/10, worst with prolonged sitting. (Pt. ignoring pain at initial eval)    Time  10    Period  Weeks    Status  On-going    Target Date  04/20/19      PT LONG TERM GOAL #2   Title  Pt. will report walking for 30 min. without an increase in pain >2/10 to allow for return to exercise and demonstrate  improved function.    Baseline  walking ~ 10 min with pain ~ 6/10    Time  10    Period  Weeks    Status  On-going    Target Date  04/20/19      PT LONG TERM GOAL #3   Title  Pt. Will be able to cath without pain and will be cleared by physiscian to d/c self-cath due to decreased residual  volume with bladder emptying.    Baseline  required to cath 3 times per day, painful each time.    Time  10    Period  Weeks    Status  On-going    Target Date  04/20/19      PT LONG TERM GOAL #4   Title  Patient will score less than or equal to 40% on the Female NIH-CPSI and 30% on the [facility] to demonstrate a reduction in pain, urinary symptoms, and an improved quality of life.    Baseline  Female NIH-CPSI: 38/43 (88%) , PDI: 45/75 (60%); PDI 44/75 (59%) and Female NIH-CPSI: 27/43 (64%) on 02-08-19    Time  10    Period  Weeks    Status  Partially Met    Target Date  04/20/19      PT LONG TERM GOAL #5   Title  Patient will demonstrate increased step length with reciprical arm swing, and demonstrate no scissoring steps.    Baseline  Pt currently demonstrated scissoring steppage on R LE, increased trunk rotation and deep core engagement with reciprical walking poles.    Time  10    Period  Weeks    Status  On-going    Target Date  04/20/19            Plan - 02/08/19 1803    Clinical Impression Statement  Pt. has made progress toward her goals but continues to have significant pain and dysfunction. Her pain is somewhat decreased from a high of 9/10 to 7/10 and her posture and walking mechanics are improving. She has been able to get back to regular exercise with guidance on what is and is not appropriate for her without increased pain and she is no longer having pain in the lower part of the left leg. She demonstrates difficulty assessing her pain and has decreased body awareness as she has used avoidance and dissociation to cope with her pain for many years. We will be moving away from a manual  approach to a more exercise based approach to improve core stability and decrease risk of fall and will also employ a more bio-psycho-social model to address the chronic nature of the patients pain and attempt to build an interdisciplinary team to address her defecits. She has begun acupuncture over the last week and this does seem to be improving her ability to empty her bladder at mid-day some. We will add a new goal to address the patient's new emphasis on fear of falling as a concern. She responded well today to gait training using principles of external cueing, visual cues, and tactile cueing, we will build on this success and continue with functional strengthening. We will continue for 10 more weeks at 2x per week.    Personal Factors and Comorbidities  Age;Comorbidity 3+    Comorbidities  Osteoporosis, scoliosis, Arthritis, COPD, recent lumbosacral fusion for spondylolisthesis.    Examination-Activity Limitations  Toileting;Sit;Stand;Bend;Lift;Carry    Examination-Participation Restrictions  Interpersonal Relationship;Yard Work;Cleaning;Laundry    Stability/Clinical Decision Making  Unstable/Unpredictable    Rehab Potential  Good    PT Frequency  2x / week    PT Duration  Other (comment)   10 weeks   PT Treatment/Interventions  ADLs/Self Care Home Management;Aquatic Therapy;Moist Heat;Electrical Stimulation;Traction;Therapeutic activities;Functional mobility training;Stair training;Gait training;Therapeutic exercise;Balance training;Neuromuscular re-education;Patient/family education;Manual techniques;Dry needling;Passive range of motion;Scar mobilization;Taping    PT Next Visit Plan  Re-try TPDN to L hamstrings (prone near biceps femoris) hip flexor PRN following pelvic alignment,  review pelvic tilts, teapot,  and TA in mod-quad, Internal TP release to R PRN, scar mobility at 7 o'clock?.    PT Home Exercise Plan  diaphragmatic breathing, hip-flexor stretch, side-stretch, pelvic tilts in seated,  TA in mod-quad, diaphragmatic breathing, supine butterfly, bow-and-arrow, posterior fourchette massage, urge-suppression, squatty potty, doorway chest-stretch, arm ABD slides, biased lumbar twist stretch    Consulted and Agree with Plan of Care  Patient       Patient will benefit from skilled therapeutic intervention in order to improve the following deficits and impairments:  Abnormal gait, Decreased balance, Increased muscle spasms, Decreased range of motion, Decreased scar mobility, Improper body mechanics, Decreased coordination, Decreased strength, Increased fascial restricitons, Impaired flexibility, Postural dysfunction, Pain  Visit Diagnosis: Abnormal posture  Other muscle spasm     Problem List Patient Active Problem List   Diagnosis Date Noted  . Chronic UTI 12/06/2018  . Status post lumbar surgery 12/06/2018  . Spondylolisthesis, lumbar region 09/26/2018  . Aortic atherosclerosis ([place]) 08/31/2018  . CAD (coronary artery disease) 02/23/2018  . Fatigue 01/20/2018  . Short of breath on exertion 10/17/2017  . Advanced care planning/counseling discussion 03/28/2017  . Varicose veins of both lower extremities with complications 45/85/9292  . Arthritis 12/09/2016  . Vaginal atrophy 09/10/2015  . Menopausal hot flushes 09/10/2015  . Menopause 09/10/2015  . Onychomycosis due to dermatophyte 04/07/2015  . Bursitis of right shoulder 02/26/2015  . Environmental and seasonal allergies 02/26/2015  . Other allergic rhinitis 02/26/2015  . Osteoporosis 11/26/2014  . COPD (chronic obstructive pulmonary disease) ([place]) 11/26/2014  . Fuchs' corneal dystrophy 11/26/2014  . Essential (primary) hypertension 11/26/2014  . Hyperlipidemia 11/26/2014  . Chronic kidney disease, stage III (moderate) ([place]) 11/26/2014  . Hypothyroidism 11/26/2014  . Low back pain 11/26/2014  . Endothelial corneal dystrophy 11/26/2014   Willa Rough DPT, ATC Willa Rough 02/09/2019, 10:17 AM  [place] MAIN [facility] SERVICES [address] [place], [place], [address] Phone: [phone]   Fax:  [phone]  Name: Tanaysha C Obenchain MRN: [id number] Date of Birth: 08/11/1945

## 2019-02-09 ENCOUNTER — Ambulatory Visit: Payer: Medicare Other

## 2019-02-13 ENCOUNTER — Other Ambulatory Visit: Payer: Self-pay

## 2019-02-13 ENCOUNTER — Ambulatory Visit: Payer: Medicare Other

## 2019-02-13 DIAGNOSIS — R293 Abnormal posture: Secondary | ICD-10-CM

## 2019-02-13 DIAGNOSIS — M62838 Other muscle spasm: Secondary | ICD-10-CM

## 2019-02-13 DIAGNOSIS — R2689 Other abnormalities of gait and mobility: Secondary | ICD-10-CM | POA: Diagnosis not present

## 2019-02-13 NOTE — Therapy (Signed)
[place] MAIN [facility] SERVICES [address] [place], [place], [address] Phone: [phone]   Fax:  [phone]  Physical Therapy Treatment The patient has been informed of current processes in place at Outpatient Rehab to protect patients from Covid-19 exposure including social distancing, schedule modifications, and new cleaning procedures. After discussing their particular risk with a therapist based on the patient's personal risk factors, the patient has decided to proceed with in-person therapy.  Patient Details  Name: Julia Deleon MRN: [id number] Date of Birth: 1945/05/19 Referring Provider (PT): Dr. Maryan Puls   Encounter Date: 02/13/2019  PT End of Session - 02/13/19 1450    Visit Number  24    Number of Visits  33    Date for PT Re-Evaluation  04/19/19    Authorization Type  Medicare    Authorization Time Period  through 04/19/19    Authorization - Visit Number  1    Authorization - Number of Visits  20    PT Start Time  1000    PT Stop Time  1100    PT Time Calculation (min)  60 min    Activity Tolerance  Patient tolerated treatment well;No increased pain    Behavior During Therapy  WFL for tasks assessed/performed       Past Medical History:  Diagnosis Date  . Arthritis   . COPD (chronic obstructive pulmonary disease) ([place])   . Family history of adverse reaction to anesthesia    father was slow to wake up  . Fuch's endothelial dystrophy   . Hyperlipidemia   . Hypothyroidism   . PONV (postoperative nausea and vomiting)    slow to wake up and PONV    Past Surgical History:  Procedure Laterality Date  . APPENDECTOMY    . BACK SURGERY  09/26/2018   L4-5 PLIF by Dr. Arnoldo Morale  . CARDIAC CATHETERIZATION    . CHOLECYSTECTOMY    . EYE SURGERY    . FOOT SURGERY Right    pin removed left  . laser vein surgery    . NASAL SINUS SURGERY    . RIGHT/LEFT HEART CATH AND CORONARY ANGIOGRAPHY N/A 12/06/2017   Procedure: RIGHT/LEFT  HEART CATH AND CORONARY ANGIOGRAPHY;  Surgeon: Yolonda Kida, MD;  Location: [place] [facility];  Service: Cardiovascular;  Laterality: N/A;  . TONSILLECTOMY      There were no vitals filed for this visit.    SUBJECTIVE  Patient reports: Pt went to acupuncturist yesterday, and has a bruise on the behind her R knee. Patient reports tried to hold onto the treadmill with only one hand, she alternated UE support (Monday). Patient reports personal trainer increased aggressiveness of workout yesterday. Patient reports that the PCP said to work on sitting and work Office manager, and to work on Production manager. Patient states "part of the problem is that 2 years prior to back surgery she felt her energy disappear" and "no one has been able to figure it out".   Pt education about pain management performed again this session, patient states PCP said to continue acupuncturist and that he would not send referral to Pain Management.    Precautions:  Vaginal atrophy, sciatica, osteoporosis  Pain update:  Location of pain: B hips lateral Current pain: 4/10  Max pain: 7-8/10 Least pain: 3-4/10 Description: The B hip pain feels like "shooting pain down both legs", L>R.   *Following treatment Pt. Described less pain   Patient Goals: Improve bladder function, decrease back  pain and increase strength and balance.   OBJECTIVE  Changes in:  Observation/posture: L posterior/R anterior rotation   Palpation: TTP to B deep hip flexors.  Objective measure:  Fall outcome measure: ABC Scale: 64%    INTERVENTIONS THIS SESSION: Self-care: Pt education performed on chronic pain and fatigue relationship- including Pain education video. Patient verbalized understanding, however shares she is not convinced she will ever not have pain. (10 minutes)  There Ex:  -Standing marches B x15  -Standing glute med B x10 with green tband  -Standing hip ABD B x10 without green tband   -Thomas test hip flexor stretch B x5 deep breaths  -Figure 4 stretch B x5 deep breaths  -Seated hamstring stretch B x5 deep breaths  -"Tummy time"-  2 minutes in modified cobra- with L LE having pillow under ankle.  Thereex performed to address glute med deficits and to support active functional movements. Patient required core and glute support to perform exercises- not appropriate for HEP at this time. (35 minutes)  Gait Training:  Backwards steps B x 10 with green tband  Backwards walking B x10 with green tband  Cues for thoracic rotation required. Verbal cues for larger steps. Gait training performed to address fear of falling and balance training- glute med and thoracic rotation engagement. Patient reported that she did not have increase in pain during session. (15 minutes)   Total time: 60 min.                           PT Short Term Goals - 02/13/19 1454      PT SHORT TERM GOAL #1   Title  Patient will demonstrate improved pelvic alignment and balance of musculature surrounding the pelvis to facilitate decreased PFM spasms and decrease pelvic pain.    Baseline  Pt. demonstrates severe posterior pelvic tilt, L thoracic/R lumbar scoliosis, and R anterior/L posterior pelvic obliquity. Pt. has demonstrated improved pelvic alignment and posture but has not been able to sustain long-term due to continued lack of postural strength.    Time  5    Period  Weeks    Status  Partially Met    Target Date  03/06/19      PT SHORT TERM GOAL #2   Title  Patient will demonstrate a coordinated contraction, relaxation, and bulge of the pelvic floor muscles to demonstrate functional recruitment and motion and allow for bladder emptying.    Baseline  Pt. is unable to fully empty bladder and describes pain with catheterization, demonstrating high tone of PFM.    Time  5    Period  Weeks    Status  Achieved    Target Date  03/06/19      PT SHORT TERM GOAL #3   Title   Patient will demonstrate improved sitting and standing posture to demonstrate learning and decrease stress on the pelvic floor with functional activity.    Baseline  Pt. demonstrates severe posterior pelvic tilt, L thoracic/R lumbar scoliosis, and R anterior/L posterior pelvic obliquity. Pt is able to attain improved posture by ~ 85% but with fatigue reverts to poor posture.    Time  5    Period  Weeks    Status  Partially Met    Target Date  03/06/19        PT Long Term Goals - 02/09/19 0819      PT LONG TERM GOAL #1   Title  Patient will describe pain  no greater than 3/10 during Sitting for 2 hours to demonstrate improved functional ability.    Baseline  Max 9/10, worst with prolonged sitting. (Pt. ignoring pain at initial eval)    Time  10    Period  Weeks    Status  On-going    Target Date  04/20/19      PT LONG TERM GOAL #2   Title  Pt. will report walking for 30 min. without an increase in pain >2/10 to allow for return to exercise and demonstrate improved function.    Baseline  walking ~ 10 min with pain ~ 6/10    Time  10    Period  Weeks    Status  On-going    Target Date  04/20/19      PT LONG TERM GOAL #3   Title  Pt. Will be able to cath without pain and will be cleared by physiscian to d/c self-cath due to decreased residual volume with bladder emptying.    Baseline  required to cath 3 times per day, painful each time.    Time  10    Period  Weeks    Status  On-going    Target Date  04/20/19      PT LONG TERM GOAL #4   Title  Patient will score less than or equal to 40% on the Female NIH-CPSI and 30% on the [facility] to demonstrate a reduction in pain, urinary symptoms, and an improved quality of life.    Baseline  Female NIH-CPSI: 38/43 (88%) , PDI: 45/75 (60%); PDI 44/75 (59%) and Female NIH-CPSI: 27/43 (64%) on 02-08-19    Time  10    Period  Weeks    Status  Partially Met    Target Date  04/20/19      PT LONG TERM GOAL #5   Title  Patient will demonstrate  increased step length with reciprical arm swing, and demonstrate no scissoring steps.    Baseline  Pt currently demonstrated scissoring steppage on R LE, increased trunk rotation and deep core engagement with reciprical walking poles.    Time  10    Period  Weeks    Status  On-going    Target Date  04/20/19            Plan - 02/13/19 1453    Clinical Impression Statement  Pt. Responded well to all interventions today, demonstrating improvement in glute med recruitment (improved task performance during tactile cues) during functional tasks as well as understanding and correct performance of all education and exercises provided today. Patient benefited from gross motor tasks, rather than fine motor or isolating movements. They will continue to benefit from skilled physical therapy to work toward remaining goals and maximize function as well as decrease likelihood of symptom increase or recurrence.     Personal Factors and Comorbidities  Age;Comorbidity 3+    Comorbidities  Osteoporosis, scoliosis, Arthritis, COPD, recent lumbosacral fusion for spondylolisthesis.    Examination-Activity Limitations  Toileting;Sit;Stand;Bend;Lift;Carry    Examination-Participation Restrictions  Interpersonal Relationship;Yard Work;Cleaning;Laundry    Stability/Clinical Decision Making  Unstable/Unpredictable    Rehab Potential  Good    PT Frequency  2x / week    PT Duration  Other (comment)   10 weeks   PT Treatment/Interventions  ADLs/Self Care Home Management;Aquatic Therapy;Moist Heat;Electrical Stimulation;Traction;Therapeutic activities;Functional mobility training;Stair training;Gait training;Therapeutic exercise;Balance training;Neuromuscular re-education;Patient/family education;Manual techniques;Dry needling;Passive range of motion;Scar mobilization;Taping    PT Next Visit Plan  Re-try TPDN to L hamstrings (prone  near biceps femoris) hip flexor PRN following pelvic alignment,  review pelvic tilts,  teapot,  and TA in mod-quad, Internal TP release to R PRN, scar mobility at 7 o'clock?.    PT Home Exercise Plan  diaphragmatic breathing, hip-flexor stretch, side-stretch, pelvic tilts in seated, TA in mod-quad, diaphragmatic breathing, supine butterfly, bow-and-arrow, posterior fourchette massage, urge-suppression, squatty potty, doorway chest-stretch, arm ABD slides, biased lumbar twist stretch. green tband backwards walking exercises, seated hasmtring stretch    Consulted and Agree with Plan of Care  Patient       Patient will benefit from skilled therapeutic intervention in order to improve the following deficits and impairments:  Abnormal gait, Decreased balance, Increased muscle spasms, Decreased range of motion, Decreased scar mobility, Improper body mechanics, Decreased coordination, Decreased strength, Increased fascial restricitons, Impaired flexibility, Postural dysfunction, Pain  Visit Diagnosis: Abnormal posture  Other muscle spasm     Problem List Patient Active Problem List   Diagnosis Date Noted  . Chronic UTI 12/06/2018  . Status post lumbar surgery 12/06/2018  . Spondylolisthesis, lumbar region 09/26/2018  . Aortic atherosclerosis ([place]) 08/31/2018  . CAD (coronary artery disease) 02/23/2018  . Fatigue 01/20/2018  . Short of breath on exertion 10/17/2017  . Advanced care planning/counseling discussion 03/28/2017  . Varicose veins of both lower extremities with complications 76/28/3151  . Arthritis 12/09/2016  . Vaginal atrophy 09/10/2015  . Menopausal hot flushes 09/10/2015  . Menopause 09/10/2015  . Onychomycosis due to dermatophyte 04/07/2015  . Bursitis of right shoulder 02/26/2015  . Environmental and seasonal allergies 02/26/2015  . Other allergic rhinitis 02/26/2015  . Osteoporosis 11/26/2014  . COPD (chronic obstructive pulmonary disease) ([place]) 11/26/2014  . Fuchs' corneal dystrophy 11/26/2014  . Essential (primary) hypertension 11/26/2014  .  Hyperlipidemia 11/26/2014  . Chronic kidney disease, stage III (moderate) ([place]) 11/26/2014  . Hypothyroidism 11/26/2014  . Low back pain 11/26/2014  . Endothelial corneal dystrophy 11/26/2014   Willa Rough DPT, ATC Willa Rough 02/13/2019, 3:01 PM  [place] MAIN [facility] SERVICES [address] [place], [place], [address] Phone: [phone]   Fax:  [phone]  Name: Salsabeel C Cush MRN: [id number] Date of Birth: 01-18-1946

## 2019-02-13 NOTE — Patient Instructions (Signed)
Diary info:  Activity: Pain average: Action: If you took an action what did you do to decrease the pain?  Wins: What DID you do that went WELL? Acknowledgments (if there are any): good, bad, or neutral   New:  1)  seated hamstring stretch Hold for 5 deep breaths, repeat 2-3 times on each side.  2) "walk backward with band" Place band around door knob in the direction the door closes. Take both bands and walk backwards moving hands and feet in the direction you are moving backwards. Take one step back with the left, then take the left step forward. Then repeat with the right. Repeat 10 times.   3) Prop up on elbows laying on stomach for about 5 min. (based on your body's response)   "A" day: Chest stretch (doorway), tea-pot (sliding arm up door and pulling armpit into the door), bow-and-arrow (rotations on your side), "use bands to walk backward", side-stretch (bring one arm over-head and leaning to the side).  "B" day: TA in Mod-quad (breathing out and pulling the tummy in) hip-flexor stretch (off the side of the bed), butterfly stretch (on your back with knees out to the side), biased lumbar twist stretch (shift hips to the side and twist. Pelvic tilts is sitting (arch low back and then relax to neutral), seated hamstring stretch (pictured above).  Daily: diaphragmatic breathing (lay, sit, or stand and take deep belly breaths), pain diary, lay on your stomach "propped up" ~ 5 min.

## 2019-02-15 DIAGNOSIS — J301 Allergic rhinitis due to pollen: Secondary | ICD-10-CM | POA: Diagnosis not present

## 2019-02-16 ENCOUNTER — Other Ambulatory Visit: Payer: Self-pay

## 2019-02-16 ENCOUNTER — Ambulatory Visit: Payer: Medicare Other

## 2019-02-16 DIAGNOSIS — M62838 Other muscle spasm: Secondary | ICD-10-CM | POA: Diagnosis not present

## 2019-02-16 DIAGNOSIS — R293 Abnormal posture: Secondary | ICD-10-CM | POA: Diagnosis not present

## 2019-02-16 DIAGNOSIS — R2689 Other abnormalities of gait and mobility: Secondary | ICD-10-CM | POA: Diagnosis not present

## 2019-02-16 NOTE — Therapy (Signed)
[place] MAIN [facility] SERVICES [address] [place], [place], [address] Phone: [phone]   Fax:  [phone]  Physical Therapy Treatment  The patient has been informed of current processes in place at Outpatient Rehab to protect patients from Covid-19 exposure including social distancing, schedule modifications, and new cleaning procedures. After discussing their particular risk with a therapist based on the patient's personal risk factors, the patient has decided to proceed with in-person therapy.   Patient Details  Name: Julia Deleon MRN: [id number] Date of Birth: [birth year] Referring Provider (PT): Dr. Maryan Puls   Encounter Date: 02/16/2019  PT End of Session - 02/16/19 1027    Visit Number  25    Number of Visits  33    Date for PT Re-Evaluation  04/19/19    Authorization Type  Medicare    Authorization Time Period  through 04/19/19    Authorization - Visit Number  2    Authorization - Number of Visits  20    PT Start Time  0910    PT Stop Time  1010    PT Time Calculation (min)  60 min    Activity Tolerance  Patient tolerated treatment well;No increased pain    Behavior During Therapy  WFL for tasks assessed/performed       Past Medical History:  Diagnosis Date  . Arthritis   . COPD (chronic obstructive pulmonary disease) ([place])   . Family history of adverse reaction to anesthesia    father was slow to wake up  . Fuch's endothelial dystrophy   . Hyperlipidemia   . Hypothyroidism   . PONV (postoperative nausea and vomiting)    slow to wake up and PONV    Past Surgical History:  Procedure Laterality Date  . APPENDECTOMY    . BACK SURGERY  09/26/2018   L4-5 PLIF by Dr. Arnoldo Morale  . CARDIAC CATHETERIZATION    . CHOLECYSTECTOMY    . EYE SURGERY    . FOOT SURGERY Right    pin removed left  . laser vein surgery    . NASAL SINUS SURGERY    . RIGHT/LEFT HEART CATH AND CORONARY ANGIOGRAPHY N/A 12/06/2017   Procedure:  RIGHT/LEFT HEART CATH AND CORONARY ANGIOGRAPHY;  Surgeon: Yolonda Kida, MD;  Location: [place] [facility];  Service: Cardiovascular;  Laterality: N/A;  . TONSILLECTOMY      There were no vitals filed for this visit.    SUBJECTIVE  Patient reports: Patient reports she started her pain diary. Patient reports she has a few questions on her HEP and pain levels altering. Patient shares she had L LE pain during walking- and pt education performed that the posture endurance is currently low. Patient's L LE is "sore sore sore" today. She is getting ~ 150 when catheterizing but last night was 225.   Precautions:  Vaginal atrophy, sciatica, osteoporosis  Pain update:  Location of pain: B hips lateral (L>R)  Current pain: 4/10  Max pain: 7-8/10 Least pain: 3-4/10 Description: The B hip pain feels like "shooting pain down both legs", L>R.   *Following treatment Pt. Described less pain- 3/10 on NPS   Patient Goals: Improve bladder function, decrease back pain and increase strength and balance.   OBJECTIVE  Changes in:  Observation/posture: L posterior/R anterior rotation, L knee nearly fully extended in seated position during neural flossing but continues to lack full Ext in standing and supine (hip-flexor spasm)   Palpation: TTP to B deep hip flexors.  TTP to L dorsiflexors  Objective measure:  Fall outcome measure: ABC Scale: 64% (from previous session)    INTERVENTIONS THIS SESSION: Manual: (32 minutes)  L gastroc/soleus TTP release, and L pectineus/brevis TTP release and MFR along femoral nerve to decrease spasm and pain and allow for improved balance of musculature for improved function and decreased symptoms.   There Ex: (20 minutes)  Hip flexor stretch 1 rep on R, 2 reps on L 10 deep breaths, followed by contract, relax lengthening in order to increase tissue length and decrease pain to L LLE.  Seated hamstring stretch 2 reps on R, 3 reps on L 10  deep breaths in order to improve hamstring tissue length and decrease pain in L LLE.  Seated knee extension (LAQ) for neural flossing B 1x10 Standing thoracic rotation with improved TA activation in order to increase thoracic mobility and lengthening spine.  SLS hip raises for glute Med activation and improved balance, cues for TA and low back extensor engagement.   Gait Training: (8 minutes)  Forward gait training with B walking sticks approx 500 feet to increase thoracic rotation and increase step length. Gait training performed to address fear of falling and balance training- glute med and thoracic rotation engagement. Patient reported that she did not have increase in pain during session.    Total time: 60 min. (9:10-10:10)                              PT Short Term Goals - 02/13/19 1454      PT SHORT TERM GOAL #1   Title  Patient will demonstrate improved pelvic alignment and balance of musculature surrounding the pelvis to facilitate decreased PFM spasms and decrease pelvic pain.    Baseline  Pt. demonstrates severe posterior pelvic tilt, L thoracic/R lumbar scoliosis, and R anterior/L posterior pelvic obliquity. Pt. has demonstrated improved pelvic alignment and posture but has not been able to sustain long-term due to continued lack of postural strength.    Time  5    Period  Weeks    Status  Partially Met    Target Date  03/06/19      PT SHORT TERM GOAL #2   Title  Patient will demonstrate a coordinated contraction, relaxation, and bulge of the pelvic floor muscles to demonstrate functional recruitment and motion and allow for bladder emptying.    Baseline  Pt. is unable to fully empty bladder and describes pain with catheterization, demonstrating high tone of PFM.    Time  5    Period  Weeks    Status  Achieved    Target Date  03/06/19      PT SHORT TERM GOAL #3   Title  Patient will demonstrate improved sitting and standing posture to demonstrate  learning and decrease stress on the pelvic floor with functional activity.    Baseline  Pt. demonstrates severe posterior pelvic tilt, L thoracic/R lumbar scoliosis, and R anterior/L posterior pelvic obliquity. Pt is able to attain improved posture by ~ 85% but with fatigue reverts to poor posture.    Time  5    Period  Weeks    Status  Partially Met    Target Date  03/06/19        PT Long Term Goals - 02/09/19 0819      PT LONG TERM GOAL #1   Title  Patient will describe pain no greater than 3/10 during Sitting for  2 hours to demonstrate improved functional ability.    Baseline  Max 9/10, worst with prolonged sitting. (Pt. ignoring pain at initial eval)    Time  10    Period  Weeks    Status  On-going    Target Date  04/20/19      PT LONG TERM GOAL #2   Title  Pt. will report walking for 30 min. without an increase in pain >2/10 to allow for return to exercise and demonstrate improved function.    Baseline  walking ~ 10 min with pain ~ 6/10    Time  10    Period  Weeks    Status  On-going    Target Date  04/20/19      PT LONG TERM GOAL #3   Title  Pt. Will be able to cath without pain and will be cleared by physiscian to d/c self-cath due to decreased residual volume with bladder emptying.    Baseline  required to cath 3 times per day, painful each time.    Time  10    Period  Weeks    Status  On-going    Target Date  04/20/19      PT LONG TERM GOAL #4   Title  Patient will score less than or equal to 40% on the Female NIH-CPSI and 30% on the [facility] to demonstrate a reduction in pain, urinary symptoms, and an improved quality of life.    Baseline  Female NIH-CPSI: 38/43 (88%) , PDI: 45/75 (60%); PDI 44/75 (59%) and Female NIH-CPSI: 27/43 (64%) on 02-08-19    Time  10    Period  Weeks    Status  Partially Met    Target Date  04/20/19      PT LONG TERM GOAL #5   Title  Patient will demonstrate increased step length with reciprical arm swing, and demonstrate no scissoring  steps.    Baseline  Pt currently demonstrated scissoring steppage on R LE, increased trunk rotation and deep core engagement with reciprical walking poles.    Time  10    Period  Weeks    Status  On-going    Target Date  04/20/19            Plan - 02/16/19 1027    Clinical Impression Statement  Pt. Responded well to all interventions today, demonstrating improved L knee extension and decreased pain in L LLE as well as understanding and correct performance of all education and exercises provided. today. They will continue to benefit from skilled physical therapy to work toward remaining goals and maximize function as well as decrease likelihood of symptom increase or recurrence.    Personal Factors and Comorbidities  Age;Comorbidity 3+    Comorbidities  Osteoporosis, scoliosis, Arthritis, COPD, recent lumbosacral fusion for spondylolisthesis.    Examination-Activity Limitations  Toileting;Sit;Stand;Bend;Lift;Carry    Examination-Participation Restrictions  Interpersonal Relationship;Yard Work;Cleaning;Laundry    Stability/Clinical Decision Making  Unstable/Unpredictable    Rehab Potential  Good    PT Frequency  2x / week    PT Duration  Other (comment)   10 weeks   PT Treatment/Interventions  ADLs/Self Care Home Management;Aquatic Therapy;Moist Heat;Electrical Stimulation;Traction;Therapeutic activities;Functional mobility training;Stair training;Gait training;Therapeutic exercise;Balance training;Neuromuscular re-education;Patient/family education;Manual techniques;Dry needling;Passive range of motion;Scar mobilization;Taping    PT Next Visit Plan  Review HEP with demonstration for A and B days; TPDN to L gastroc/soleus, adductors and illipsoas. Gait training with walking sticks going backwards.    PT Home Exercise Plan  diaphragmatic breathing, hip-flexor stretch, side-stretch, pelvic tilts in seated, TA in mod-quad, diaphragmatic breathing, supine butterfly, bow-and-arrow, posterior  fourchette massage, urge-suppression, squatty potty, doorway chest-stretch, arm ABD slides, biased lumbar twist stretch. Plan adjusted for 2 split days. Added green tband backwards walking exercises, seated hasmtring stretch    Consulted and Agree with Plan of Care  Patient       Patient will benefit from skilled therapeutic intervention in order to improve the following deficits and impairments:  Abnormal gait, Decreased balance, Increased muscle spasms, Decreased range of motion, Decreased scar mobility, Improper body mechanics, Decreased coordination, Decreased strength, Increased fascial restricitons, Impaired flexibility, Postural dysfunction, Pain  Visit Diagnosis: Abnormal posture  Other muscle spasm     Problem List Patient Active Problem List   Diagnosis Date Noted  . Chronic UTI 12/06/2018  . Status post lumbar surgery 12/06/2018  . Spondylolisthesis, lumbar region 09/26/2018  . Aortic atherosclerosis ([place]) 08/31/2018  . CAD (coronary artery disease) 02/23/2018  . Fatigue 01/20/2018  . Short of breath on exertion 10/17/2017  . Advanced care planning/counseling discussion 03/28/2017  . Varicose veins of both lower extremities with complications 44/31/5400  . Arthritis 12/09/2016  . Vaginal atrophy 09/10/2015  . Menopausal hot flushes 09/10/2015  . Menopause 09/10/2015  . Onychomycosis due to dermatophyte 04/07/2015  . Bursitis of right shoulder 02/26/2015  . Environmental and seasonal allergies 02/26/2015  . Other allergic rhinitis 02/26/2015  . Osteoporosis 11/26/2014  . COPD (chronic obstructive pulmonary disease) ([place]) 11/26/2014  . Fuchs' corneal dystrophy 11/26/2014  . Essential (primary) hypertension 11/26/2014  . Hyperlipidemia 11/26/2014  . Chronic kidney disease, stage III (moderate) ([place]) 11/26/2014  . Hypothyroidism 11/26/2014  . Low back pain 11/26/2014  . Endothelial corneal dystrophy 11/26/2014   Julia Deleon DPT, ATC Julia Deleon 02/16/2019,  10:32 AM  [place] MAIN [facility] SERVICES [address] [place], [place], [address] Phone: [phone]   Fax:  [phone]  Name: Osha C Cones MRN: [id number] Date of Birth: May 26, 1945

## 2019-02-16 NOTE — Patient Instructions (Signed)
  Bring the leg out to the side, feel the stretch in the inner thigh and hold for 10 breaths, 2 times for the right, 3 times for the Left. (lean to the same side as leg outstretched if you do not feel the stretch to get a deeper stretch)  Do this TODAY 10/9 2-3 times throughout the day (when you get out of the car or are feeling tight)

## 2019-02-20 ENCOUNTER — Other Ambulatory Visit: Payer: Self-pay

## 2019-02-20 ENCOUNTER — Ambulatory Visit: Payer: Medicare Other | Admitting: Physical Therapy

## 2019-02-20 DIAGNOSIS — R293 Abnormal posture: Secondary | ICD-10-CM | POA: Diagnosis not present

## 2019-02-20 DIAGNOSIS — M62838 Other muscle spasm: Secondary | ICD-10-CM | POA: Diagnosis not present

## 2019-02-20 DIAGNOSIS — R2689 Other abnormalities of gait and mobility: Secondary | ICD-10-CM | POA: Diagnosis not present

## 2019-02-20 NOTE — Patient Instructions (Addendum)
Wall lean for scoliosis   Forearm slide against wall as you stand perpendicular to wall, band under feet, feet hip width apart,  Opposite elbow by side, , imagine holding pencil under armpit as you lean toward wall, lowering forearm against the wall and opposite hand pulls band without letting elbow move away from side body  And slide back up , straightening body    Make sure the upper trapezius muscle does not hike up to ear as band is being pulled as you lean towards wall    band  10 x 2    Leaning _ only to open the _ flank area   ______  Standing at [employer] counter Modified side plank  Hand placed in line with shoulder, Lean towards counter Bent elbow towards ribs  Shoulders down  10 x 2 reps

## 2019-02-21 DIAGNOSIS — J301 Allergic rhinitis due to pollen: Secondary | ICD-10-CM | POA: Diagnosis not present

## 2019-02-21 NOTE — Therapy (Signed)
[place] MAIN [facility] SERVICES [address] [place], [place], [address] Phone: [phone]   Fax:  [phone]  Physical Therapy Treatment  Patient Details  Name: Julia Deleon MRN: [id number] Date of Birth: [birth year] Referring Provider (PT): Dr. Maryan Puls   Encounter Date: 02/20/2019  PT End of Session - 02/21/19 1212    Visit Number  27    Number of Visits  33    Date for PT Re-Evaluation  04/19/19    Authorization Type  Medicare    Authorization Time Period  through 04/19/19    Authorization - Visit Number  3    Authorization - Number of Visits  20    PT Start Time  1115    PT Stop Time  5208    PT Time Calculation (min)  45 min    Activity Tolerance  Patient tolerated treatment well;No increased pain    Behavior During Therapy  WFL for tasks assessed/performed       Past Medical History:  Diagnosis Date  . Arthritis   . COPD (chronic obstructive pulmonary disease) ([place])   . Family history of adverse reaction to anesthesia    father was slow to wake up  . Fuch's endothelial dystrophy   . Hyperlipidemia   . Hypothyroidism   . PONV (postoperative nausea and vomiting)    slow to wake up and PONV    Past Surgical History:  Procedure Laterality Date  . APPENDECTOMY    . BACK SURGERY  09/26/2018   L4-5 PLIF by Dr. Arnoldo Morale  . CARDIAC CATHETERIZATION    . CHOLECYSTECTOMY    . EYE SURGERY    . FOOT SURGERY Right    pin removed left  . laser vein surgery    . NASAL SINUS SURGERY    . RIGHT/LEFT HEART CATH AND CORONARY ANGIOGRAPHY N/A 12/06/2017   Procedure: RIGHT/LEFT HEART CATH AND CORONARY ANGIOGRAPHY;  Surgeon: Yolonda Kida, MD;  Location: [place] [facility];  Service: Cardiovascular;  Laterality: N/A;  . TONSILLECTOMY      There were no vitals filed for this visit.       Subjective Patient reports that following the previous session, she had little increase in pain during her long car ride later that day.  Patient had decreased symptoms until the return trip home at the end of the weekend. Patient shares that she was unable to integrate given stretches between car ride stops, however she states compliance with other HEP during a regular day. Patient states she was surprised by the decrease in symptoms following the previous session.  Objective ? Gait speed: 0.60ms at beginning of session ? Scoliotic treatment (there act 23 minutes) ? R side plank modified for decreased L thoracic scoliotic curve ? L side stretch against wall with red t-band for decreased L thoracic scoliotic curve ? Manual treatment (manual therapy 32 minutes) ? R scapula mobilizations for increased scapular retraction and depression for improved posture to decrease pain and improve symptoms. ? Trigger point release/MFR at R medial border in order to improve scapular retraction and depressions and improved coordination of musculature. ? Passive- hot pack in supine for decreased pain and improved pain management.   Assessment Patient demonstrated improvements in coordination of L thoracic lateral flexion, decreased shoulder flexion, and decreased muscle spasms surrounding R scapula and paraspinals levels T 3-8. Patient demonstrated understanding of exercises and benefit to performance in relationship to symptomology. Patient required tactile and verbal cues to decrease scoliotic  curve involvement in symptomology. Patient was re-educated on pain science relationship to POC, and the importance of keeping treatment below pain threshold.  Plan Patient will continue to benefit from skilled pelvic health PT to address listed impairment and specific goals. Next session: Review HEP- adjust as needed for compliance; assess trigger points in thoracic spine and facilitate scapulae movement for improved posture. Target thoracic rotation and posture with gait and balance.                    PT  Short Term Goals - 02/13/19 1454      PT SHORT TERM GOAL #1   Title  Patient will demonstrate improved pelvic alignment and balance of musculature surrounding the pelvis to facilitate decreased PFM spasms and decrease pelvic pain.    Baseline  Pt. demonstrates severe posterior pelvic tilt, L thoracic/R lumbar scoliosis, and R anterior/L posterior pelvic obliquity. Pt. has demonstrated improved pelvic alignment and posture but has not been able to sustain long-term due to continued lack of postural strength.    Time  5    Period  Weeks    Status  Partially Met    Target Date  03/06/19      PT SHORT TERM GOAL #2   Title  Patient will demonstrate a coordinated contraction, relaxation, and bulge of the pelvic floor muscles to demonstrate functional recruitment and motion and allow for bladder emptying.    Baseline  Pt. is unable to fully empty bladder and describes pain with catheterization, demonstrating high tone of PFM.    Time  5    Period  Weeks    Status  Achieved    Target Date  03/06/19      PT SHORT TERM GOAL #3   Title  Patient will demonstrate improved sitting and standing posture to demonstrate learning and decrease stress on the pelvic floor with functional activity.    Baseline  Pt. demonstrates severe posterior pelvic tilt, L thoracic/R lumbar scoliosis, and R anterior/L posterior pelvic obliquity. Pt is able to attain improved posture by ~ 85% but with fatigue reverts to poor posture.    Time  5    Period  Weeks    Status  Partially Met    Target Date  03/06/19        PT Long Term Goals - 02/09/19 0819      PT LONG TERM GOAL #1   Title  Patient will describe pain no greater than 3/10 during Sitting for 2 hours to demonstrate improved functional ability.    Baseline  Max 9/10, worst with prolonged sitting. (Pt. ignoring pain at initial eval)    Time  10    Period  Weeks    Status  On-going    Target Date  04/20/19      PT LONG TERM GOAL #2   Title  Pt. will report  walking for 30 min. without an increase in pain >2/10 to allow for return to exercise and demonstrate improved function.    Baseline  walking ~ 10 min with pain ~ 6/10    Time  10    Period  Weeks    Status  On-going    Target Date  04/20/19      PT LONG TERM GOAL #3   Title  Pt. Will be able to cath without pain and will be cleared by physiscian to d/c self-cath due to decreased residual volume with bladder emptying.    Baseline  required to  cath 3 times per day, painful each time.    Time  10    Period  Weeks    Status  On-going    Target Date  04/20/19      PT LONG TERM GOAL #4   Title  Patient will score less than or equal to 40% on the Female NIH-CPSI and 30% on the [employer] to demonstrate a reduction in pain, urinary symptoms, and an improved quality of life.    Baseline  Female NIH-CPSI: 38/43 (88%) , PDI: 45/75 (60%); PDI 44/75 (59%) and Female NIH-CPSI: 27/43 (64%) on 02-08-19    Time  10    Period  Weeks    Status  Partially Met    Target Date  04/20/19      PT LONG TERM GOAL #5   Title  Patient will demonstrate increased step length with reciprical arm swing, and demonstrate no scissoring steps.    Baseline  Pt currently demonstrated scissoring steppage on R LE, increased trunk rotation and deep core engagement with reciprical walking poles.    Time  10    Period  Weeks    Status  On-going    Target Date  04/20/19           Personal Factors and Comorbidities  Age;Comorbidity 3+    Comorbidities  Osteoporosis, scoliosis, Arthritis, COPD, recent lumbosacral fusion for spondylolisthesis.    Examination-Activity Limitations  Toileting;Sit;Stand;Bend;Lift;Carry    Examination-Participation Restrictions  Interpersonal Relationship;Yard Work;Cleaning;Laundry    Stability/Clinical Decision Making  Unstable/Unpredictable    Rehab Potential  Good    PT Frequency  2x / week    PT Duration  Other (comment)    PT Treatment/Interventions  ADLs/Self Care Home Management;Aquatic  Therapy;Moist Heat;Electrical Stimulation;Traction;Therapeutic activities;Functional mobility training;Stair training;Gait training;Therapeutic exercise;Balance training;Neuromuscular re-education;Patient/family education;Manual techniques;Dry needling;Passive range of motion;Scar mobilization;Taping    PT Next Visit Plan  Review HEP with demonstration for A and B days; TPDN to L gastroc/soleus, adductors and illipsoas. Gait training with walking sticks going backwards.    PT Home Exercise Plan  diaphragmatic breathing, hip-flexor stretch, side-stretch, pelvic tilts in seated, TA in mod-quad, diaphragmatic breathing, supine butterfly, bow-and-arrow, posterior fourchette massage, urge-suppression, squatty potty, doorway chest-stretch, arm ABD slides, biased lumbar twist stretch. Plan adjusted for 2 split days. Added green tband backwards walking exercises, seated hasmtring stretch    Consulted and Agree with Plan of Care  Patient       Patient will benefit from skilled therapeutic intervention in order to improve the following deficits and impairments:  Abnormal gait, Decreased balance, Increased muscle spasms, Decreased range of motion, Decreased scar mobility, Improper body mechanics, Decreased coordination, Decreased strength, Increased fascial restricitons, Impaired flexibility, Postural dysfunction, Pain  Visit Diagnosis: Abnormal posture  Other muscle spasm     Problem List Patient Active Problem List   Diagnosis Date Noted  . Chronic UTI 12/06/2018  . Status post lumbar surgery 12/06/2018  . Spondylolisthesis, lumbar region 09/26/2018  . Aortic atherosclerosis ([place]) 08/31/2018  . CAD (coronary artery disease) 02/23/2018  . Fatigue 01/20/2018  . Short of breath on exertion 10/17/2017  . Advanced care planning/counseling discussion 03/28/2017  . Varicose veins of both lower extremities with complications 32/35/5732  . Arthritis 12/09/2016  . Vaginal atrophy 09/10/2015  . Menopausal  hot flushes 09/10/2015  . Menopause 09/10/2015  . Onychomycosis due to dermatophyte 04/07/2015  . Bursitis of right shoulder 02/26/2015  . Environmental and seasonal allergies 02/26/2015  . Other allergic rhinitis 02/26/2015  . Osteoporosis 11/26/2014  .  COPD (chronic obstructive pulmonary disease) ([place]) 11/26/2014  . Fuchs' corneal dystrophy 11/26/2014  . Essential (primary) hypertension 11/26/2014  . Hyperlipidemia 11/26/2014  . Chronic kidney disease, stage III (moderate) ([place]) 11/26/2014  . Hypothyroidism 11/26/2014  . Low back pain 11/26/2014  . Endothelial corneal dystrophy 11/26/2014      Tomasita Morrow, SPT   Jerl Mina ,PT, DPT, E-RYT  02/21/2019, 12:15 PM  [place] MAIN [facility] SERVICES [address] [place], [place], [address] Phone: [phone]   Fax:  [phone]  Name: Julia Deleon MRN: [id number] Date of Birth: Sep 12, 1945

## 2019-02-22 DIAGNOSIS — J301 Allergic rhinitis due to pollen: Secondary | ICD-10-CM | POA: Diagnosis not present

## 2019-02-23 ENCOUNTER — Ambulatory Visit: Payer: Medicare Other | Admitting: Physical Therapy

## 2019-02-23 ENCOUNTER — Other Ambulatory Visit: Payer: Self-pay

## 2019-02-23 DIAGNOSIS — R2689 Other abnormalities of gait and mobility: Secondary | ICD-10-CM | POA: Diagnosis not present

## 2019-02-23 DIAGNOSIS — M62838 Other muscle spasm: Secondary | ICD-10-CM | POA: Diagnosis not present

## 2019-02-23 DIAGNOSIS — R293 Abnormal posture: Secondary | ICD-10-CM

## 2019-02-23 NOTE — Therapy (Cosign Needed)
[place] MAIN [facility] SERVICES [address] [place], [place], [address] Phone: [phone]   Fax:  [phone]  Physical Therapy Treatment Patient Details  Name: Julia Deleon MRN: [id number] Date of Birth: [birth year] Referring Provider (PT): Dr. Maryan Puls   Encounter Date: 02/23/2019  PT End of Session - 02/23/19 1053    Visit Number  27    Number of Visits  33    Date for PT Re-Evaluation  04/19/19    Authorization Type  Medicare    Authorization Time Period  through 04/19/19    Authorization - Visit Number  3    Authorization - Number of Visits  20    PT Start Time  0900    PT Stop Time  1000    PT Time Calculation (min)  60 min    Activity Tolerance  Patient tolerated treatment well;No increased pain    Behavior During Therapy  WFL for tasks assessed/performed       Past Medical History:  Diagnosis Date  . Arthritis   . COPD (chronic obstructive pulmonary disease) ([place])   . Family history of adverse reaction to anesthesia    father was slow to wake up  . Fuch's endothelial dystrophy   . Hyperlipidemia   . Hypothyroidism   . PONV (postoperative nausea and vomiting)    slow to wake up and PONV    Past Surgical History:  Procedure Laterality Date  . APPENDECTOMY    . BACK SURGERY  09/26/2018   L4-5 PLIF by Dr. Arnoldo Morale  . CARDIAC CATHETERIZATION    . CHOLECYSTECTOMY    . EYE SURGERY    . FOOT SURGERY Right    pin removed left  . laser vein surgery    . NASAL SINUS SURGERY    . RIGHT/LEFT HEART CATH AND CORONARY ANGIOGRAPHY N/A 12/06/2017   Procedure: RIGHT/LEFT HEART CATH AND CORONARY ANGIOGRAPHY;  Surgeon: Yolonda Kida, MD;  Location: [facility];  Service: Cardiovascular;  Laterality: N/A;  . TONSILLECTOMY      There were no vitals filed for this visit.  Subjective Assessment - 02/23/19 1043    Subjective  Patient reports having decreased symptoms this week and that her chiropractor even stated her  walking has been looking a lot better, improving even in the last few weeks. Patient reports she always feels better after coming to PT, but is nervous about when the pain will flare up again.    Limitations  Sitting;Walking;Standing    Currently in Pain?  Yes    Pain Orientation  Left;Right    Pain Descriptors / Indicators  Burning;Tightness    Pain Type  Chronic pain    Pain Radiating Towards  B hips, laterally towards the knee.    Pain Frequency  Constant         [facility] PT Assessment - 02/23/19 0916      Observation/Other Assessments   Observations  diaphragmatic excursion 1 cm ( measured at anterior 12th rib)  ( post Tx: 2 cm increased diaphragmatic excursion)       Coordination   Gross Motor Movements are Fluid and Coordinated  --   firing sequence, glut, hamstring, multifidis B    Fine Motor Movements are Fluid and Coordinated  --   limited diaphragm excursion/ excessiveabdominal expansion     Strength   Overall Strength Comments  hip flexion 4+/5B, knee flexion 5/5 B, knee flexion R 4-/5, L 5/5,  toe ext B 5/5 ,  DF 5/5 B,  standing PF with UE 10reps R 3/5, 7 reps L 2+/5,  R hip abd 2+/5, L hip 3/5   hip ext L 3/5, R 3/5   posterior sling: difficult getting into position     Palpation   Spinal mobility  limited intercostal expansion laterally.   B rotation with shooting pain to lateral knee and knee level ( post Tx: no pain)      Palpation comment  increased tightness L mm at L5/S and thoracic mm ( decreased post Tx)           Interventions Performed This Session:   NM Re-Edu  -- Patient performed open book exercise (MWM) for neuromuscular reeducation of thoracolumbar mobility, assisting with scapular retraction and depression, and assisting with increased intercostal/rib excursion. Education on diaphragmatic breathing, leading from ribs and how these abdominal pressures relate to current symptomology.   Manual Therapy  -- PA Grades II/III along thoracic and lumbar spine  to increased mobility, and increased coordination of thoracic rotation.  -- QL distraction and paraspinal distraction (STM) to centralized symptomology down B LE. Pain was reduce up to the hip (from low back to calf pain prior to session on L; and centralized to thigh on R).  --B intercostal mobility with diaphragmatic breathing, increased excursion post treatment.  -- External PFM assessment- increased tautness R > L, minimal contraction with exhalation, slight bulge with inhalation, decreased taught-ness, bilaterally following today's treatment.   ThereAct:  --Bed mobility to reduce increased abdominal pressure, that is causing increased bulging down onto PFM, performed and practiced log rolling and breathing coordination. Patient education on using heating pack when patient returns home to minimize risk of soreness after hands on work. Patient education on hip and knee alignment when side sleeping, and using small pillow between her knees to reduce symptoms.           [place] Adult PT Treatment/Exercise - 02/23/19 0001      Bed Mobility   Bed Mobility  Rolling Right;Rolling Left;Right Sidelying to Sit   Edu on log rolling this session; decreased abd pressure   Rolling Right  Supervision/verbal cueing    Rolling Left  Supervision/Verbal cueing    Right Sidelying to Sit  Supervision/Verbal cueing      Manual Therapy   Manual Therapy  Joint mobilization;Soft tissue mobilization    Manual therapy comments  Hypomobility at thoracic and lumbar spine (T3-12 and L1-5), rotation bilaterally increases symptomology down the legs. Hypomobility of intercostals and rib mobility. Increaed thoracic/lumbar mobility following treatment. 1cm excursion with inhalation pre session, 2 cm excursion at ribs following session. Decreased symptoms down B LE     Joint Mobilization  grade II/III PA mobility at T 3-12 in sidelying with MWM (open book); intercostal mobility MWM during diaphramatic breathing     Soft  tissue mobilization  B QL distraction and paraspinal distractions at approx L1-5 decreasing radiating symptoms, centralizing symptoms bilaterally, L > R.          PT Short Term Goals - 02/13/19 1454      PT SHORT TERM GOAL #1   Title  Patient will demonstrate improved pelvic alignment and balance of musculature surrounding the pelvis to facilitate decreased PFM spasms and decrease pelvic pain.    Baseline  Pt. demonstrates severe posterior pelvic tilt, L thoracic/R lumbar scoliosis, and R anterior/L posterior pelvic obliquity. Pt. has demonstrated improved pelvic alignment and posture but has not been able to sustain long-term due to continued lack  of postural strength.    Time  5    Period  Weeks    Status  Partially Met    Target Date  03/06/19      PT SHORT TERM GOAL #2   Title  Patient will demonstrate a coordinated contraction, relaxation, and bulge of the pelvic floor muscles to demonstrate functional recruitment and motion and allow for bladder emptying.    Baseline  Pt. is unable to fully empty bladder and describes pain with catheterization, demonstrating high tone of PFM.    Time  5    Period  Weeks    Status  Achieved    Target Date  03/06/19      PT SHORT TERM GOAL #3   Title  Patient will demonstrate improved sitting and standing posture to demonstrate learning and decrease stress on the pelvic floor with functional activity.    Baseline  Pt. demonstrates severe posterior pelvic tilt, L thoracic/R lumbar scoliosis, and R anterior/L posterior pelvic obliquity. Pt is able to attain improved posture by ~ 85% but with fatigue reverts to poor posture.    Time  5    Period  Weeks    Status  Partially Met    Target Date  03/06/19        PT Long Term Goals - 02/09/19 0819      PT LONG TERM GOAL #1   Title  Patient will describe pain no greater than 3/10 during Sitting for 2 hours to demonstrate improved functional ability.    Baseline  Max 9/10, worst with prolonged  sitting. (Pt. ignoring pain at initial eval)    Time  10    Period  Weeks    Status  On-going    Target Date  04/20/19      PT LONG TERM GOAL #2   Title  Pt. will report walking for 30 min. without an increase in pain >2/10 to allow for return to exercise and demonstrate improved function.    Baseline  walking ~ 10 min with pain ~ 6/10    Time  10    Period  Weeks    Status  On-going    Target Date  04/20/19      PT LONG TERM GOAL #3   Title  Pt. Will be able to cath without pain and will be cleared by physiscian to d/c self-cath due to decreased residual volume with bladder emptying.    Baseline  required to cath 3 times per day, painful each time.    Time  10    Period  Weeks    Status  On-going    Target Date  04/20/19      PT LONG TERM GOAL #4   Title  Patient will score less than or equal to 40% on the Female NIH-CPSI and 30% on the [facility] to demonstrate a reduction in pain, urinary symptoms, and an improved quality of life.    Baseline  Female NIH-CPSI: 38/43 (88%) , PDI: 45/75 (60%); PDI 44/75 (59%) and Female NIH-CPSI: 27/43 (64%) on 02-08-19    Time  10    Period  Weeks    Status  Partially Met    Target Date  04/20/19      PT LONG TERM GOAL #5   Title  Patient will demonstrate increased step length with reciprical arm swing, and demonstrate no scissoring steps.    Baseline  Pt currently demonstrated scissoring steppage on R LE, increased trunk rotation and deep core  engagement with reciprical walking poles.    Time  10    Period  Weeks    Status  On-going    Target Date  04/20/19            Plan - 02/23/19 1053    Clinical Impression Statement  Pt demonstrated decreased rib mobility at start of session, patient was educated on diaphragmatic breathing and the importance of mobilizing the ribs, in order to have PFM mobility (preparing for future PFM strengthening and coordination). Patient required verbal and tactile cues for coordination of breathing, that improved  throughout the session. Patient demonstrated a whole 1cm improvement in rib excursion following manual treatment this session.   Patient continues to have posterior pelvic tilt with lateral lean to the R, although lateral lean has improved, good carry over, in standing since Tuesday. Patient had increased elasticity in PFM (external) following treatment this session. Patient will continue to benefit from skilled PT to address posture and its relationship to her PFM dysfunction.    Personal Factors and Comorbidities  Age;Comorbidity 3+    Comorbidities  Osteoporosis, scoliosis, Arthritis, COPD, recent lumbosacral fusion for spondylolisthesis.    Examination-Activity Limitations  Toileting;Sit;Stand;Bend;Lift;Carry    Examination-Participation Restrictions  Interpersonal Relationship;Yard Work;Cleaning;Laundry    Stability/Clinical Decision Making  Unstable/Unpredictable    Rehab Potential  Good    PT Frequency  2x / week    PT Duration  Other (comment)    PT Treatment/Interventions  ADLs/Self Care Home Management;Aquatic Therapy;Moist Heat;Electrical Stimulation;Traction;Therapeutic activities;Functional mobility training;Stair training;Gait training;Therapeutic exercise;Balance training;Neuromuscular re-education;Patient/family education;Manual techniques;Dry needling;Passive range of motion;Scar mobilization;Taping    PT Next Visit Plan  Review HEP with demonstration for A and B days; TPDN to L gastroc/soleus, adductors and illipsoas. Gait training with walking sticks going backwards.    PT Home Exercise Plan  diaphragmatic breathing, hip-flexor stretch, side-stretch, pelvic tilts in seated, TA in mod-quad, diaphragmatic breathing, supine butterfly, bow-and-arrow, posterior fourchette massage, urge-suppression, squatty potty, doorway chest-stretch, arm ABD slides, biased lumbar twist stretch. Plan adjusted for 2 split days. Added green tband backwards walking exercises, seated hasmtring stretch     Consulted and Agree with Plan of Care  Patient       Patient will benefit from skilled therapeutic intervention in order to improve the following deficits and impairments:  Abnormal gait, Decreased balance, Increased muscle spasms, Decreased range of motion, Decreased scar mobility, Improper body mechanics, Decreased coordination, Decreased strength, Increased fascial restricitons, Impaired flexibility, Postural dysfunction, Pain  Visit Diagnosis: Abnormal posture  Other muscle spasm     Problem List Patient Active Problem List   Diagnosis Date Noted  . Chronic UTI 12/06/2018  . Status post lumbar surgery 12/06/2018  . Spondylolisthesis, lumbar region 09/26/2018  . Aortic atherosclerosis ([place]) 08/31/2018  . CAD (coronary artery disease) 02/23/2018  . Fatigue 01/20/2018  . Short of breath on exertion 10/17/2017  . Advanced care planning/counseling discussion 03/28/2017  . Varicose veins of both lower extremities with complications 94/85/4627  . Arthritis 12/09/2016  . Vaginal atrophy 09/10/2015  . Menopausal hot flushes 09/10/2015  . Menopause 09/10/2015  . Onychomycosis due to dermatophyte 04/07/2015  . Bursitis of right shoulder 02/26/2015  . Environmental and seasonal allergies 02/26/2015  . Other allergic rhinitis 02/26/2015  . Osteoporosis 11/26/2014  . COPD (chronic obstructive pulmonary disease) ([place]) 11/26/2014  . Fuchs' corneal dystrophy 11/26/2014  . Essential (primary) hypertension 11/26/2014  . Hyperlipidemia 11/26/2014  . Chronic kidney disease, stage III (moderate) ([place]) 11/26/2014  . Hypothyroidism 11/26/2014  .  Low back pain 11/26/2014  . Endothelial corneal dystrophy 11/26/2014    Barbarita Hutmacher , SPT 02/23/2019, 11:01 AM    Jerl Mina, PT, DPT, E-RYT    [place] MAIN [facility] SERVICES [address] [place], [place], [address] Phone: [phone]   Fax:  [phone]  Name: Julia Deleon MRN:  [id number] Date of Birth: 1946-04-01

## 2019-02-23 NOTE — Patient Instructions (Signed)
Remember when rolling, keep head and elbow on the bed, and small scootches.   Remember to exercises we gave you to stretch out that Right side from Tuesday.   Remember pillow or tied towel and keep it between your knees while you sleep. The knees and hip should be level with one another.   You're doing great. Keep it up!

## 2019-02-26 ENCOUNTER — Ambulatory Visit: Payer: Medicare Other | Admitting: Physical Therapy

## 2019-02-26 ENCOUNTER — Other Ambulatory Visit: Payer: Self-pay

## 2019-02-26 DIAGNOSIS — D631 Anemia in chronic kidney disease: Secondary | ICD-10-CM | POA: Diagnosis not present

## 2019-02-26 DIAGNOSIS — I10 Essential (primary) hypertension: Secondary | ICD-10-CM | POA: Diagnosis not present

## 2019-02-26 DIAGNOSIS — R2689 Other abnormalities of gait and mobility: Secondary | ICD-10-CM | POA: Diagnosis not present

## 2019-02-26 DIAGNOSIS — M62838 Other muscle spasm: Secondary | ICD-10-CM | POA: Diagnosis not present

## 2019-02-26 DIAGNOSIS — R293 Abnormal posture: Secondary | ICD-10-CM

## 2019-02-26 DIAGNOSIS — R600 Localized edema: Secondary | ICD-10-CM | POA: Diagnosis not present

## 2019-02-26 DIAGNOSIS — N1831 Chronic kidney disease, stage 3a: Secondary | ICD-10-CM | POA: Diagnosis not present

## 2019-02-26 NOTE — Therapy (Cosign Needed)
[place] MAIN [facility] SERVICES [address] [place], [place], [address] Phone: [phone]   Fax:  [phone]  Physical Therapy Treatment  Patient Details  Name: Julia Deleon MRN: [id number] Date of Birth: [birth year] Referring Provider (PT): Dr. Maryan Puls   Encounter Date: 02/26/2019  PT End of Session - 02/26/19 1303    Visit Number  28    Number of Visits  33    Date for PT Re-Evaluation  04/19/19    Authorization Type  Medicare    Authorization Time Period  through 04/19/19    Authorization - Visit Number  4    Authorization - Number of Visits  20    PT Start Time  1300    PT Stop Time  1400    PT Time Calculation (min)  60 min    Activity Tolerance  Patient tolerated treatment well;No increased pain    Behavior During Therapy  WFL for tasks assessed/performed       Past Medical History:  Diagnosis Date  . Arthritis   . COPD (chronic obstructive pulmonary disease) ([place])   . Family history of adverse reaction to anesthesia    father was slow to wake up  . Fuch's endothelial dystrophy   . Hyperlipidemia   . Hypothyroidism   . PONV (postoperative nausea and vomiting)    slow to wake up and PONV    Past Surgical History:  Procedure Laterality Date  . APPENDECTOMY    . BACK SURGERY  09/26/2018   L4-5 PLIF by Dr. Arnoldo Morale  . CARDIAC CATHETERIZATION    . CHOLECYSTECTOMY    . EYE SURGERY    . FOOT SURGERY Right    pin removed left  . laser vein surgery    . NASAL SINUS SURGERY    . RIGHT/LEFT HEART CATH AND CORONARY ANGIOGRAPHY N/A 12/06/2017   Procedure: RIGHT/LEFT HEART CATH AND CORONARY ANGIOGRAPHY;  Surgeon: Yolonda Kida, MD;  Location: [facility];  Service: Cardiovascular;  Laterality: N/A;  . TONSILLECTOMY      There were no vitals filed for this visit.  Subjective Assessment - 02/26/19 1303    Subjective  Patient went the gym and accupuncturist this morning. After Friday's session- just at the R  hip (points to greater trochanter)- pain reduction until Friday even. More than 10 minutes of walking, bilateral leg pain. Pt reports she did HEP to reduce pain flare up on Friday.    Limitations  Sitting;Walking;Standing         [facility] PT Assessment - 02/26/19 0001      Observation/Other Assessments   Scoliosis  L Lumbar convex, decreased in stance this sesion       Posture/Postural Control   Posture Comments  increased upright posture, increased scapular retraction and depression at baseline, improved neutral pelvis, stilll preference for posterior pelvic tilt       Palpation   SI assessment   hypomobility with nutation, increased mobility following treatment        Interventions Performed This Session:  Manual Treatment (32 minutes) - fibular head mobility (AP);  - decreased sacral movement in nutation- with hip extension and - ER and ABD at SIJ improved. - improved with sacral mobs MWM. - QL traction on L  - L hip ABD, fascial release along quad/IT line; IT/hamstring line.   Neuromuscular Re-ed (15 minutes)  - Deep Core Level 1- cues for increased rib excursion and decreased belly breathing. "soft breaths".  -  pelvic tilts in hook-lying, sitting and supine, improved proprioception. Improved anterior pelvic tilt in all 3 positions.   ThereAct: (12 minutes)  - edu on pain management- seated hamstring stretch with alternating DF/PF- L>R; piriformis seated figure 4 stretch L>R, pt performed - given as HEP>                  [facility] Adult PT Treatment/Exercise - 02/26/19 0001      Ambulation/Gait   Gait Comments  1.01 m/s gait speed post test; no R lateral lean; no forward shoulder flexion.       Manual Therapy   Manual Therapy  Joint mobilization;Soft tissue mobilization    Manual therapy comments  fibular head AP with DF/PF, decreased tenderness following treatment     Joint Mobilization  hypomobility with sacral nutation (L hip extension and ER/ABD); PA grades II/II  sacral mobs and superor glide, increased mobility post treatment. Increased pelvic propriorception post treatment     Soft tissue mobilization  L QL distraction; L lateral LE; L lateral lower leg soft tissue release with DF/PF                PT Short Term Goals - 02/13/19 1454      PT SHORT TERM GOAL #1   Title  Patient will demonstrate improved pelvic alignment and balance of musculature surrounding the pelvis to facilitate decreased PFM spasms and decrease pelvic pain.    Baseline  Pt. demonstrates severe posterior pelvic tilt, L thoracic/R lumbar scoliosis, and R anterior/L posterior pelvic obliquity. Pt. has demonstrated improved pelvic alignment and posture but has not been able to sustain long-term due to continued lack of postural strength.    Time  5    Period  Weeks    Status  Partially Met    Target Date  03/06/19      PT SHORT TERM GOAL #2   Title  Patient will demonstrate a coordinated contraction, relaxation, and bulge of the pelvic floor muscles to demonstrate functional recruitment and motion and allow for bladder emptying.    Baseline  Pt. is unable to fully empty bladder and describes pain with catheterization, demonstrating high tone of PFM.    Time  5    Period  Weeks    Status  Achieved    Target Date  03/06/19      PT SHORT TERM GOAL #3   Title  Patient will demonstrate improved sitting and standing posture to demonstrate learning and decrease stress on the pelvic floor with functional activity.    Baseline  Pt. demonstrates severe posterior pelvic tilt, L thoracic/R lumbar scoliosis, and R anterior/L posterior pelvic obliquity. Pt is able to attain improved posture by ~ 85% but with fatigue reverts to poor posture.    Time  5    Period  Weeks    Status  Partially Met    Target Date  03/06/19        PT Long Term Goals - 02/09/19 0819      PT LONG TERM GOAL #1   Title  Patient will describe pain no greater than 3/10 during Sitting for 2 hours to  demonstrate improved functional ability.    Baseline  Max 9/10, worst with prolonged sitting. (Pt. ignoring pain at initial eval)    Time  10    Period  Weeks    Status  On-going    Target Date  04/20/19      PT LONG TERM GOAL #2  Title  Pt. will report walking for 30 min. without an increase in pain >2/10 to allow for return to exercise and demonstrate improved function.    Baseline  walking ~ 10 min with pain ~ 6/10    Time  10    Period  Weeks    Status  On-going    Target Date  04/20/19      PT LONG TERM GOAL #3   Title  Pt. Will be able to cath without pain and will be cleared by physiscian to d/c self-cath due to decreased residual volume with bladder emptying.    Baseline  required to cath 3 times per day, painful each time.    Time  10    Period  Weeks    Status  On-going    Target Date  04/20/19      PT LONG TERM GOAL #4   Title  Patient will score less than or equal to 40% on the Female NIH-CPSI and 30% on the [facility] to demonstrate a reduction in pain, urinary symptoms, and an improved quality of life.    Baseline  Female NIH-CPSI: 38/43 (88%) , PDI: 45/75 (60%); PDI 44/75 (59%) and Female NIH-CPSI: 27/43 (64%) on 02-08-19    Time  10    Period  Weeks    Status  Partially Met    Target Date  04/20/19      PT LONG TERM GOAL #5   Title  Patient will demonstrate increased step length with reciprical arm swing, and demonstrate no scissoring steps.    Baseline  Pt currently demonstrated scissoring steppage on R LE, increased trunk rotation and deep core engagement with reciprical walking poles.    Time  10    Period  Weeks    Status  On-going    Target Date  04/20/19            Plan - 02/26/19 1340    Clinical Impression Statement  Clinical Impression: Patient arrived to PT with good carry over of no radicular symptoms in side-lying this session. Patient described she did pain management strategies after being in the car this weekend.   Patient had reduced L LE  tenderness following hip abduction manual treatment as well as MWM at fibular head and lateral compartment (extensor longus; digitorum longus; anterior tib).   Patient had improved mobility in sacrum with nutation, with improved proprioception of pelvis in hook-lying, sitting and standing.   Neuromuscular re-education of improved pelvic alignment in various positions, as well as diaphragmatic breathing, with emphasis on rib excursion. Patient was able to achieve improved awareness of body mechanics and performed deep core level 1 (given for HEP). This improvement allows the patient to have improved sequencing and coordination of PFM at future sessions.   Patient had good carry over for gait speed as well, improving from 0.9 EOS last session to 1.89ms at the end of this session. Patient demonstrated decreased gait abnormalities, decreased lateral lean to R, decreased posterior pelvic tilt, increased truncal rotation. This good carry over for improved gait speed could imply reduction of fall risk and improved coordination of gross musculature.    Patient will continue to benefit from skilled pelvic health PT to address patients goals, decrease chronic pain and improve patient's urinary incontinence.         Personal Factors and Comorbidities  Age;Comorbidity 3+    Comorbidities  Osteoporosis, scoliosis, Arthritis, COPD, recent lumbosacral fusion for spondylolisthesis.    Examination-Activity Limitations  Toileting;Sit;Stand;Bend;Lift;Carry  Examination-Participation Restrictions  Interpersonal Relationship;Yard Work;Cleaning;Laundry    Stability/Clinical Decision Making  Unstable/Unpredictable    Rehab Potential  Good    PT Frequency  2x / week    PT Duration  Other (comment)    PT Treatment/Interventions  ADLs/Self Care Home Management;Aquatic Therapy;Moist Heat;Electrical Stimulation;Traction;Therapeutic activities;Functional mobility training;Stair training;Gait training;Therapeutic  exercise;Balance training;Neuromuscular re-education;Patient/family education;Manual techniques;Dry needling;Passive range of motion;Scar mobilization;Taping    PT Next Visit Plan  Review HEP with demonstration for A and B days; TPDN to L gastroc/soleus, adductors and illipsoas. Gait training with walking sticks going backwards.    PT Home Exercise Plan  Deep core level 1; diaphragmatic breathing, hip-flexor stretch, side-stretch, pelvic tilts in seated, TA in mod-quad, diaphragmatic breathing, supine butterfly, bow-and-arrow, posterior fourchette massage, urge-suppression, squatty potty, doorway chest-stretch, arm ABD slides, biased lumbar twist stretch. Plan adjusted for 2 split days. Added green tband backwards walking exercises, seated hasmtring stretch    Consulted and Agree with Plan of Care  Patient       Patient will benefit from skilled therapeutic intervention in order to improve the following deficits and impairments:  Abnormal gait, Decreased balance, Increased muscle spasms, Decreased range of motion, Decreased scar mobility, Improper body mechanics, Decreased coordination, Decreased strength, Increased fascial restricitons, Impaired flexibility, Postural dysfunction, Pain  Visit Diagnosis: Abnormal posture  Other muscle spasm     Problem List Patient Active Problem List   Diagnosis Date Noted  . Chronic UTI 12/06/2018  . Status post lumbar surgery 12/06/2018  . Spondylolisthesis, lumbar region 09/26/2018  . Aortic atherosclerosis ([place]) 08/31/2018  . CAD (coronary artery disease) 02/23/2018  . Fatigue 01/20/2018  . Short of breath on exertion 10/17/2017  . Advanced care planning/counseling discussion 03/28/2017  . Varicose veins of both lower extremities with complications 43/83/7793  . Arthritis 12/09/2016  . Vaginal atrophy 09/10/2015  . Menopausal hot flushes 09/10/2015  . Menopause 09/10/2015  . Onychomycosis due to dermatophyte 04/07/2015  . Bursitis of right  shoulder 02/26/2015  . Environmental and seasonal allergies 02/26/2015  . Other allergic rhinitis 02/26/2015  . Osteoporosis 11/26/2014  . COPD (chronic obstructive pulmonary disease) ([place]) 11/26/2014  . Fuchs' corneal dystrophy 11/26/2014  . Essential (primary) hypertension 11/26/2014  . Hyperlipidemia 11/26/2014  . Chronic kidney disease, stage III (moderate) ([place]) 11/26/2014  . Hypothyroidism 11/26/2014  . Low back pain 11/26/2014  . Endothelial corneal dystrophy 11/26/2014    Tomasita Morrow, SPT  02/26/2019, 3:03 PM  [place] MAIN [facility] SERVICES [address] [place], [place], [address] Phone: [phone]   Fax:  [phone]  Name: Maryalice C Rude MRN: [id number] Date of Birth: 01-Nov-1945

## 2019-02-26 NOTE — Patient Instructions (Addendum)
Pelvic tilts in sitting or laying down with knees bent  With feet firmly planted on the ground, and sits bones firmly on the chair.  Remember: "softer breathing; less movement"   Cues from your hands- put one hand on your ribs and one hand on your belly. When you inhale ribs should lift out and as you exhale the belly muscles should gently draw up.   Hamstring Stretch in seated.  With feet firmly planted on the ground, and sits bones firmly on the chair, put left leg out straight, lean forward from your hips till you feel a stretch in the back of your legs.    Deep Core level 1   Figure 4 Stretch   Pelvic tilts in Standing.

## 2019-02-28 DIAGNOSIS — Z23 Encounter for immunization: Secondary | ICD-10-CM | POA: Diagnosis not present

## 2019-03-01 ENCOUNTER — Ambulatory Visit: Payer: Medicare Other

## 2019-03-01 ENCOUNTER — Other Ambulatory Visit: Payer: Self-pay

## 2019-03-01 DIAGNOSIS — M62838 Other muscle spasm: Secondary | ICD-10-CM | POA: Diagnosis not present

## 2019-03-01 DIAGNOSIS — J301 Allergic rhinitis due to pollen: Secondary | ICD-10-CM | POA: Diagnosis not present

## 2019-03-01 DIAGNOSIS — R2689 Other abnormalities of gait and mobility: Secondary | ICD-10-CM | POA: Diagnosis not present

## 2019-03-01 DIAGNOSIS — R293 Abnormal posture: Secondary | ICD-10-CM

## 2019-03-01 NOTE — Therapy (Signed)
[place] MAIN [facility] SERVICES [address] [place], [place], [address] Phone: [phone]   Fax:  [phone]  Physical Therapy Treatment The patient has been informed of current processes in place at Outpatient Rehab to protect patients from Covid-19 exposure including social distancing, schedule modifications, and new cleaning procedures. After discussing their particular risk with a therapist based on the patient's personal risk factors, the patient has decided to proceed with in-person therapy.  Patient Details  Name: Julia Deleon MRN: [id number] Date of Birth: 11/04/1945 Referring Provider (PT): Dr. Maryan Puls   Encounter Date: 03/01/2019  PT End of Session - 03/01/19 1645    Visit Number  29    Number of Visits  33    Date for PT Re-Evaluation  04/19/19    Authorization Type  Medicare    Authorization Time Period  through 04/19/19    Authorization - Visit Number  5    Authorization - Number of Visits  20    PT Start Time  6599    PT Stop Time  1630    PT Time Calculation (min)  60 min    Activity Tolerance  Patient tolerated treatment well;No increased pain    Behavior During Therapy  WFL for tasks assessed/performed       Past Medical History:  Diagnosis Date  . Arthritis   . COPD (chronic obstructive pulmonary disease) ([place])   . Family history of adverse reaction to anesthesia    father was slow to wake up  . Fuch's endothelial dystrophy   . Hyperlipidemia   . Hypothyroidism   . PONV (postoperative nausea and vomiting)    slow to wake up and PONV    Past Surgical History:  Procedure Laterality Date  . APPENDECTOMY    . BACK SURGERY  09/26/2018   L4-5 PLIF by Dr. Arnoldo Morale  . CARDIAC CATHETERIZATION    . CHOLECYSTECTOMY    . EYE SURGERY    . FOOT SURGERY Right    pin removed left  . laser vein surgery    . NASAL SINUS SURGERY    . RIGHT/LEFT HEART CATH AND CORONARY ANGIOGRAPHY N/A 12/06/2017   Procedure: RIGHT/LEFT  HEART CATH AND CORONARY ANGIOGRAPHY;  Surgeon: Yolonda Kida, MD;  Location: [facility];  Service: Cardiovascular;  Laterality: N/A;  . TONSILLECTOMY      There were no vitals filed for this visit.   SUBJECTIVE  Patient reports: Patient reports she was completely exhausted on Tuesday after having all her appointments, including PT on Monday. She went to the gym yesterday and Chiropractor. Patient says she felt a little better yesterday and she feels "tired" today. R hip pain is still present, but less severe.   Is having a hard time utilizing a pillow between her knees while she sleeps. And she woke up every 2 hours last night.   Patient shares her pain is the most aggravating to herself, and incontinence is the "least of her worries".   Precautions:  Vaginal atrophy, sciatica, osteoporosis  Pain update:  Location of pain: R hip pain (greater trochanter); Still has some L hip pain (less severe).  Current pain: 4/10  Max pain: 7-8/10 Least pain: 3-4/10 Description: B hip achy; shooting pain on L (had previously resolved)   3/10 on NPS with both after session   Patient Goals: Improve bladder function, decrease back pain and increase strength and balance.   OBJECTIVE  Changes in:  Observation/posture: Increased neutral pelvic  tilt in neutral; less posterior pelvic tilt/less sway back.   More hip ER on L vs R.   Palpation: TTP at B OI, piriformis, L glute med TTP at B pectineus    Objective measure:  NA    INTERVENTIONS THIS SESSION: Manual: (40  minutes)  Manual trigger point release at B pectineus, B OI in prone, B piriformis and L glute med to decrease spasm and pain and allow for improved balance of musculature for improved function and decreased symptoms. Patient did not have improved hip ER following manual treatment, however experienced a decrease in pain level.    There Ex: (20 minutes)  Hook-lying pelvic tilts with tactile  cues at sacrum and breathing sequencing (less chest breathing this session). Progressed to sitting pelvic tilts, with self tactile cues for correct positioning. With standing pelvic tilts, tactile feedback for wear BOS in foot assisted with coordination.  Patient education on HEP review and answering questions of HEP exercises. Patient reported concerns for decreased R hip ROM compared to L with figure 4 stretch and answered questions on deep core progressions.     Total time: 60 min. 3:30-4:30                                        PT Short Term Goals - 02/13/19 1454      PT SHORT TERM GOAL #1   Title  Patient will demonstrate improved pelvic alignment and balance of musculature surrounding the pelvis to facilitate decreased PFM spasms and decrease pelvic pain.    Baseline  Pt. demonstrates severe posterior pelvic tilt, L thoracic/R lumbar scoliosis, and R anterior/L posterior pelvic obliquity. Pt. has demonstrated improved pelvic alignment and posture but has not been able to sustain long-term due to continued lack of postural strength.    Time  5    Period  Weeks    Status  Partially Met    Target Date  03/06/19      PT SHORT TERM GOAL #2   Title  Patient will demonstrate a coordinated contraction, relaxation, and bulge of the pelvic floor muscles to demonstrate functional recruitment and motion and allow for bladder emptying.    Baseline  Pt. is unable to fully empty bladder and describes pain with catheterization, demonstrating high tone of PFM.    Time  5    Period  Weeks    Status  Achieved    Target Date  03/06/19      PT SHORT TERM GOAL #3   Title  Patient will demonstrate improved sitting and standing posture to demonstrate learning and decrease stress on the pelvic floor with functional activity.    Baseline  Pt. demonstrates severe posterior pelvic tilt, L thoracic/R lumbar scoliosis, and R anterior/L posterior pelvic obliquity. Pt is able  to attain improved posture by ~ 85% but with fatigue reverts to poor posture.    Time  5    Period  Weeks    Status  Partially Met    Target Date  03/06/19        PT Long Term Goals - 02/09/19 0819      PT LONG TERM GOAL #1   Title  Patient will describe pain no greater than 3/10 during Sitting for 2 hours to demonstrate improved functional ability.    Baseline  Max 9/10, worst with prolonged sitting. (Pt. ignoring pain at initial eval)  Time  10    Period  Weeks    Status  On-going    Target Date  04/20/19      PT LONG TERM GOAL #2   Title  Pt. will report walking for 30 min. without an increase in pain >2/10 to allow for return to exercise and demonstrate improved function.    Baseline  walking ~ 10 min with pain ~ 6/10    Time  10    Period  Weeks    Status  On-going    Target Date  04/20/19      PT LONG TERM GOAL #3   Title  Pt. Will be able to cath without pain and will be cleared by physiscian to d/c self-cath due to decreased residual volume with bladder emptying.    Baseline  required to cath 3 times per day, painful each time.    Time  10    Period  Weeks    Status  On-going    Target Date  04/20/19      PT LONG TERM GOAL #4   Title  Patient will score less than or equal to 40% on the Female NIH-CPSI and 30% on the [facility] to demonstrate a reduction in pain, urinary symptoms, and an improved quality of life.    Baseline  Female NIH-CPSI: 38/43 (88%) , PDI: 45/75 (60%); PDI 44/75 (59%) and Female NIH-CPSI: 27/43 (64%) on 02-08-19    Time  10    Period  Weeks    Status  Partially Met    Target Date  04/20/19      PT LONG TERM GOAL #5   Title  Patient will demonstrate increased step length with reciprical arm swing, and demonstrate no scissoring steps.    Baseline  Pt currently demonstrated scissoring steppage on R LE, increased trunk rotation and deep core engagement with reciprical walking poles.    Time  10    Period  Weeks    Status  On-going    Target Date   04/20/19            Plan - 03/01/19 1645    Clinical Impression Statement  Pt. Responded well to all interventions today, demonstrating decreased pain level and improved self correction of pelvic tilts in supine and standing, as well as understanding and correct performance of all education and exercises provided today.   They will continue to benefit from skilled physical therapy to work toward remaining goals and maximize function as well as decrease likelihood of symptom increase or recurrence.    Personal Factors and Comorbidities  Age;Comorbidity 3+    Comorbidities  Osteoporosis, scoliosis, Arthritis, COPD, recent lumbosacral fusion for spondylolisthesis.    Examination-Activity Limitations  Toileting;Sit;Stand;Bend;Lift;Carry    Examination-Participation Restrictions  Interpersonal Relationship;Yard Work;Cleaning;Laundry    Stability/Clinical Decision Making  Unstable/Unpredictable    Rehab Potential  Good    PT Frequency  2x / week    PT Duration  Other (comment)    PT Treatment/Interventions  ADLs/Self Care Home Management;Aquatic Therapy;Moist Heat;Electrical Stimulation;Traction;Therapeutic activities;Functional mobility training;Stair training;Gait training;Therapeutic exercise;Balance training;Neuromuscular re-education;Patient/family education;Manual techniques;Dry needling;Passive range of motion;Scar mobilization;Taping    PT Next Visit Plan  Review HEP with demonstration for A and B days; TPDN to L gastroc/soleus, adductors and illipsoas. Gait training with walking sticks going backwards; ** 10-22 improving hip ER (R>L)    PT Home Exercise Plan  Deep core level 1; diaphragmatic breathing, hip-flexor stretch, side-stretch, pelvic tilts in seated, TA in mod-quad, diaphragmatic breathing,  supine butterfly, bow-and-arrow, posterior fourchette massage, urge-suppression, squatty potty, doorway chest-stretch, arm ABD slides, biased lumbar twist stretch. Plan adjusted for 2 split  days. Added green tband backwards walking exercises, seated hasmtring stretch    Consulted and Agree with Plan of Care  Patient       Patient will benefit from skilled therapeutic intervention in order to improve the following deficits and impairments:  Abnormal gait, Decreased balance, Increased muscle spasms, Decreased range of motion, Decreased scar mobility, Improper body mechanics, Decreased coordination, Decreased strength, Increased fascial restricitons, Impaired flexibility, Postural dysfunction, Pain  Visit Diagnosis: Abnormal posture  Other muscle spasm     Problem List Patient Active Problem List   Diagnosis Date Noted  . Chronic UTI 12/06/2018  . Status post lumbar surgery 12/06/2018  . Spondylolisthesis, lumbar region 09/26/2018  . Aortic atherosclerosis ([place]) 08/31/2018  . CAD (coronary artery disease) 02/23/2018  . Fatigue 01/20/2018  . Short of breath on exertion 10/17/2017  . Advanced care planning/counseling discussion 03/28/2017  . Varicose veins of both lower extremities with complications 62/56/3893  . Arthritis 12/09/2016  . Vaginal atrophy 09/10/2015  . Menopausal hot flushes 09/10/2015  . Menopause 09/10/2015  . Onychomycosis due to dermatophyte 04/07/2015  . Bursitis of right shoulder 02/26/2015  . Environmental and seasonal allergies 02/26/2015  . Other allergic rhinitis 02/26/2015  . Osteoporosis 11/26/2014  . COPD (chronic obstructive pulmonary disease) ([place]) 11/26/2014  . Fuchs' corneal dystrophy 11/26/2014  . Essential (primary) hypertension 11/26/2014  . Hyperlipidemia 11/26/2014  . Chronic kidney disease, stage III (moderate) ([place]) 11/26/2014  . Hypothyroidism 11/26/2014  . Low back pain 11/26/2014  . Endothelial corneal dystrophy 11/26/2014    Riggin Cuttino, SPT  03/01/2019, 4:47 PM   This entire session was performed under direct supervision and direction of a licensed therapist/therapist assistant . I have personally read, edited and  approve of the note as written.  Willa Rough DPT, [place] MAIN [facility] SERVICES [address] [place], [place], [address] Phone: [phone]   Fax:  [phone]  Name: Julia Deleon MRN: [id number] Date of Birth: [birth year]

## 2019-03-06 ENCOUNTER — Ambulatory Visit: Payer: Medicare Other | Admitting: Physical Therapy

## 2019-03-06 ENCOUNTER — Other Ambulatory Visit: Payer: Self-pay | Admitting: Family Medicine

## 2019-03-06 ENCOUNTER — Other Ambulatory Visit: Payer: Self-pay

## 2019-03-06 DIAGNOSIS — R2689 Other abnormalities of gait and mobility: Secondary | ICD-10-CM | POA: Diagnosis not present

## 2019-03-06 DIAGNOSIS — M62838 Other muscle spasm: Secondary | ICD-10-CM

## 2019-03-06 DIAGNOSIS — R293 Abnormal posture: Secondary | ICD-10-CM | POA: Diagnosis not present

## 2019-03-06 DIAGNOSIS — I10 Essential (primary) hypertension: Secondary | ICD-10-CM

## 2019-03-06 NOTE — Patient Instructions (Signed)
Deep core level 1 and deep core level 2  2 x day

## 2019-03-07 NOTE — Therapy (Signed)
[place] MAIN [facility] SERVICES [address] [place], [place], [address] Phone: [phone]   Fax:  [phone]  Physical Therapy Treatment  Patient Details  Name: Julia Deleon MRN: [id number] Date of Birth: [birth year] Referring Provider (PT): Dr. Maryan Puls   Encounter Date: 03/06/2019  PT End of Session - 03/07/19 1201    Visit Number  30    Number of Visits  33    Date for PT Re-Evaluation  04/19/19    Authorization Type  Medicare    Authorization Time Period  through 04/19/19    Authorization - Visit Number  6    Authorization - Number of Visits  20    PT Start Time  1300    PT Stop Time  1400    PT Time Calculation (min)  60 min    Activity Tolerance  Patient tolerated treatment well;No increased pain    Behavior During Therapy  WFL for tasks assessed/performed       Past Medical History:  Diagnosis Date  . Arthritis   . COPD (chronic obstructive pulmonary disease) ([place])   . Family history of adverse reaction to anesthesia    father was slow to wake up  . Fuch's endothelial dystrophy   . Hyperlipidemia   . Hypothyroidism   . PONV (postoperative nausea and vomiting)    slow to wake up and PONV    Past Surgical History:  Procedure Laterality Date  . APPENDECTOMY    . BACK SURGERY  09/26/2018   L4-5 PLIF by Dr. Arnoldo Morale  . CARDIAC CATHETERIZATION    . CHOLECYSTECTOMY    . EYE SURGERY    . FOOT SURGERY Right    pin removed left  . laser vein surgery    . NASAL SINUS SURGERY    . RIGHT/LEFT HEART CATH AND CORONARY ANGIOGRAPHY N/A 12/06/2017   Procedure: RIGHT/LEFT HEART CATH AND CORONARY ANGIOGRAPHY;  Surgeon: Yolonda Kida, MD;  Location: [place];  Service: Cardiovascular;  Laterality: N/A;  . TONSILLECTOMY      There were no vitals filed for this visit.  Subjective Assessment - 03/06/19 1305    Subjective  The radiating pain mostly the L , sometimes the R  is better some days. Pt usually wakes up  with it every morning. It lasts until she gets up and move. Her urination is back to where it was prior to surgery. The acupuncture has really helped it.    Limitations  Sitting;Walking;Standing         [facility] PT Assessment - 03/07/19 1159      Observation/Other Assessments   Observations  posterior pelvic tilt       Coordination   Gross Motor Movements are Fluid and Coordinated  --   dyscoordinaiton of deep core, poor co-act of TrA      Palpation   Palpation comment  Increased tightness along R interspinals/ posterior/ lateral intercostals R , medial scap R  ( decreased post Tx)       Ambulation/Gait   Gait Comments  less rotation of spine, less side flexion                    [facility] Adult PT Treatment/Exercise - 03/07/19 1158      Neuro Re-ed    Neuro Re-ed Details   cued for deep core coordination  level 1 and 2        Manual Therapy   Manual therapy comments  STM/MWM/ rocking at posterior/ medial aspect of R scapula, posterior intercostals ribs T10-12 on R                PT Short Term Goals - 02/13/19 1454      PT SHORT TERM GOAL #1   Title  Patient will demonstrate improved pelvic alignment and balance of musculature surrounding the pelvis to facilitate decreased PFM spasms and decrease pelvic pain.    Baseline  Pt. demonstrates severe posterior pelvic tilt, L thoracic/R lumbar scoliosis, and R anterior/L posterior pelvic obliquity. Pt. has demonstrated improved pelvic alignment and posture but has not been able to sustain long-term due to continued lack of postural strength.    Time  5    Period  Weeks    Status  Partially Met    Target Date  03/06/19      PT SHORT TERM GOAL #2   Title  Patient will demonstrate a coordinated contraction, relaxation, and bulge of the pelvic floor muscles to demonstrate functional recruitment and motion and allow for bladder emptying.    Baseline  Pt. is unable to fully empty bladder and describes pain with  catheterization, demonstrating high tone of PFM.    Time  5    Period  Weeks    Status  Achieved    Target Date  03/06/19      PT SHORT TERM GOAL #3   Title  Patient will demonstrate improved sitting and standing posture to demonstrate learning and decrease stress on the pelvic floor with functional activity.    Baseline  Pt. demonstrates severe posterior pelvic tilt, L thoracic/R lumbar scoliosis, and R anterior/L posterior pelvic obliquity. Pt is able to attain improved posture by ~ 85% but with fatigue reverts to poor posture.    Time  5    Period  Weeks    Status  Partially Met    Target Date  03/06/19        PT Long Term Goals - 02/09/19 0819      PT LONG TERM GOAL #1   Title  Patient will describe pain no greater than 3/10 during Sitting for 2 hours to demonstrate improved functional ability.    Baseline  Max 9/10, worst with prolonged sitting. (Pt. ignoring pain at initial eval)    Time  10    Period  Weeks    Status  On-going    Target Date  04/20/19      PT LONG TERM GOAL #2   Title  Pt. will report walking for 30 min. without an increase in pain >2/10 to allow for return to exercise and demonstrate improved function.    Baseline  walking ~ 10 min with pain ~ 6/10    Time  10    Period  Weeks    Status  On-going    Target Date  04/20/19      PT LONG TERM GOAL #3   Title  Pt. Will be able to cath without pain and will be cleared by physiscian to d/c self-cath due to decreased residual volume with bladder emptying.    Baseline  required to cath 3 times per day, painful each time.    Time  10    Period  Weeks    Status  On-going    Target Date  04/20/19      PT LONG TERM GOAL #4   Title  Patient will score less than or equal to 40% on the Female NIH-CPSI and 30%  on the [facility] to demonstrate a reduction in pain, urinary symptoms, and an improved quality of life.    Baseline  Female NIH-CPSI: 38/43 (88%) , PDI: 45/75 (60%); PDI 44/75 (59%) and Female NIH-CPSI: 27/43  (64%) on 02-08-19    Time  10    Period  Weeks    Status  Partially Met    Target Date  04/20/19      PT LONG TERM GOAL #5   Title  Patient will demonstrate increased step length with reciprical arm swing, and demonstrate no scissoring steps.    Baseline  Pt currently demonstrated scissoring steppage on R LE, increased trunk rotation and deep core engagement with reciprical walking poles.    Time  10    Period  Weeks    Status  On-going    Target Date  04/20/19            Plan - 03/07/19 1201    Clinical Impression Statement  Addressed pt's rotational component to scoliosis to achieve more lengthening to R flank and shoulder abduction/ scapular retraction/ depression on R. Progressed to deep core coordination and strengthening to day which will help pt achieve more anterior tilt of pelvis and provide postural stability to spine to minimize bilateral radiating pain. Pt reported centralizing of radiating pain B post Tx. Pt continues to benefit from skilled PT.      Personal Factors and Comorbidities  Age;Comorbidity 3+    Comorbidities  Osteoporosis, scoliosis, Arthritis, COPD, recent lumbosacral fusion for spondylolisthesis.    Examination-Activity Limitations  Toileting;Sit;Stand;Bend;Lift;Carry    Examination-Participation Restrictions  Interpersonal Relationship;Yard Work;Cleaning;Laundry    Stability/Clinical Decision Making  Unstable/Unpredictable    Rehab Potential  Good    PT Frequency  2x / week    PT Duration  Other (comment)    PT Treatment/Interventions  ADLs/Self Care Home Management;Aquatic Therapy;Moist Heat;Electrical Stimulation;Traction;Therapeutic activities;Functional mobility training;Stair training;Gait training;Therapeutic exercise;Balance training;Neuromuscular re-education;Patient/family education;Manual techniques;Dry needling;Passive range of motion;Scar mobilization;Taping    PT Next Visit Plan  Review HEP with demonstration for A and B days; TPDN to L  gastroc/soleus, adductors and illipsoas. Gait training with walking sticks going backwards; ** 10-22 improving hip ER (R>L)    PT Home Exercise Plan  Deep core level 1; diaphragmatic breathing, hip-flexor stretch, side-stretch, pelvic tilts in seated, TA in mod-quad, diaphragmatic breathing, supine butterfly, bow-and-arrow, posterior fourchette massage, urge-suppression, squatty potty, doorway chest-stretch, arm ABD slides, biased lumbar twist stretch. Plan adjusted for 2 split days. Added green tband backwards walking exercises, seated hasmtring stretch    Consulted and Agree with Plan of Care  Patient       Patient will benefit from skilled therapeutic intervention in order to improve the following deficits and impairments:  Abnormal gait, Decreased balance, Increased muscle spasms, Decreased range of motion, Decreased scar mobility, Improper body mechanics, Decreased coordination, Decreased strength, Increased fascial restricitons, Impaired flexibility, Postural dysfunction, Pain  Visit Diagnosis: Abnormal posture  Other muscle spasm     Problem List Patient Active Problem List   Diagnosis Date Noted  . Chronic UTI 12/06/2018  . Status post lumbar surgery 12/06/2018  . Spondylolisthesis, lumbar region 09/26/2018  . Aortic atherosclerosis ([place]) 08/31/2018  . CAD (coronary artery disease) 02/23/2018  . Fatigue 01/20/2018  . Short of breath on exertion 10/17/2017  . Advanced care planning/counseling discussion 03/28/2017  . Varicose veins of both lower extremities with complications 57/32/2025  . Arthritis 12/09/2016  . Vaginal atrophy 09/10/2015  . Menopausal hot flushes 09/10/2015  . Menopause  09/10/2015  . Onychomycosis due to dermatophyte 04/07/2015  . Bursitis of right shoulder 02/26/2015  . Environmental and seasonal allergies 02/26/2015  . Other allergic rhinitis 02/26/2015  . Osteoporosis 11/26/2014  . COPD (chronic obstructive pulmonary disease) ([place]) 11/26/2014  .  Fuchs' corneal dystrophy 11/26/2014  . Essential (primary) hypertension 11/26/2014  . Hyperlipidemia 11/26/2014  . Chronic kidney disease, stage III (moderate) ([place]) 11/26/2014  . Hypothyroidism 11/26/2014  . Low back pain 11/26/2014  . Endothelial corneal dystrophy 11/26/2014    Jerl Mina ,PT, DPT, E-RYT  03/07/2019, 12:02 PM  [place] MAIN [facility] SERVICES [address] [place], [place], [address] Phone: [phone]   Fax:  [phone]  Name: Julia Deleon MRN: [id number] Date of Birth: Lanayah 26, 1947

## 2019-03-08 DIAGNOSIS — J301 Allergic rhinitis due to pollen: Secondary | ICD-10-CM | POA: Diagnosis not present

## 2019-03-09 ENCOUNTER — Ambulatory Visit: Payer: Medicare Other | Admitting: Physical Therapy

## 2019-03-09 ENCOUNTER — Other Ambulatory Visit: Payer: Self-pay

## 2019-03-09 DIAGNOSIS — M62838 Other muscle spasm: Secondary | ICD-10-CM | POA: Diagnosis not present

## 2019-03-09 DIAGNOSIS — R2689 Other abnormalities of gait and mobility: Secondary | ICD-10-CM | POA: Diagnosis not present

## 2019-03-09 DIAGNOSIS — R293 Abnormal posture: Secondary | ICD-10-CM | POA: Diagnosis not present

## 2019-03-09 NOTE — Therapy (Signed)
[place] MAIN [facility] SERVICES [address] [place], [place], [address] Phone: [phone]   Fax:  [phone]  Physical Therapy Treatment  Patient Details  Name: Julia Deleon MRN: [id number] Date of Birth: 1945/09/17 Referring Provider (PT): Dr. Maryan Puls   Encounter Date: 03/09/2019  PT End of Session - 03/09/19 1142    Visit Number  31    Number of Visits  33    Date for PT Re-Evaluation  04/19/19    Authorization Type  Medicare    Authorization Time Period  through 04/19/19    Authorization - Visit Number  7    Authorization - Number of Visits  20    PT Start Time  1005    PT Stop Time  1115    PT Time Calculation (min)  70 min    Activity Tolerance  Patient tolerated treatment well;No increased pain    Behavior During Therapy  WFL for tasks assessed/performed       Past Medical History:  Diagnosis Date  . Arthritis   . COPD (chronic obstructive pulmonary disease) ([place])   . Family history of adverse reaction to anesthesia    father was slow to wake up  . Fuch's endothelial dystrophy   . Hyperlipidemia   . Hypothyroidism   . PONV (postoperative nausea and vomiting)    slow to wake up and PONV    Past Surgical History:  Procedure Laterality Date  . APPENDECTOMY    . BACK SURGERY  09/26/2018   L4-5 PLIF by Dr. Arnoldo Morale  . CARDIAC CATHETERIZATION    . CHOLECYSTECTOMY    . EYE SURGERY    . FOOT SURGERY Right    pin removed left  . laser vein surgery    . NASAL SINUS SURGERY    . RIGHT/LEFT HEART CATH AND CORONARY ANGIOGRAPHY N/A 12/06/2017   Procedure: RIGHT/LEFT HEART CATH AND CORONARY ANGIOGRAPHY;  Surgeon: Yolonda Kida, MD;  Location: [facility];  Service: Cardiovascular;  Laterality: N/A;  . TONSILLECTOMY      There were no vitals filed for this visit.  Subjective Assessment - 03/09/19 1050    Subjective  Pt drove to [facility]  to visit her sister and did stretches during the drive and feels tired  today. Radiating leg pain still occurs upon waking but it is less in intensity.  Pt felt better after last session. Pt felt sore in the R ribs .         [facility] PT Assessment - 03/09/19 1149      Observation/Other Assessments   Observations  less R shoudler IR, less thoracic kyphosis  ( post Tx: better cervical alignment)       Palpation   Palpation comment  upper neck muscles, medial scap mm B.  abdomen softer and co-activation of TrA present with deep core level 2                  Pelvic Floor Special Questions - 03/09/19 1152    External Perineal Exam  through clothingL L obturator internus tight ( decreased post Tx with report of less radiating pain of LLE)         [facility] Adult PT Treatment/Exercise - 03/09/19 1142      Neuro Re-ed    Neuro Re-ed Details   reviewed deep core 2      Modalities   Modalities  Moist Heat      Moist Heat Therapy   Number  Minutes Moist Heat  10 Minutes    Moist Heat Location  --   neck/ upper back during HEP review      Manual Therapy   Manual therapy comments  STM/MWM upper neck muscles, medial scap mm B                PT Short Term Goals - 02/13/19 1454      PT SHORT TERM GOAL #1   Title  Patient will demonstrate improved pelvic alignment and balance of musculature surrounding the pelvis to facilitate decreased PFM spasms and decrease pelvic pain.    Baseline  Pt. demonstrates severe posterior pelvic tilt, L thoracic/R lumbar scoliosis, and R anterior/L posterior pelvic obliquity. Pt. has demonstrated improved pelvic alignment and posture but has not been able to sustain long-term due to continued lack of postural strength.    Time  5    Period  Weeks    Status  Partially Met    Target Date  03/06/19      PT SHORT TERM GOAL #2   Title  Patient will demonstrate a coordinated contraction, relaxation, and bulge of the pelvic floor muscles to demonstrate functional recruitment and motion and allow for bladder emptying.     Baseline  Pt. is unable to fully empty bladder and describes pain with catheterization, demonstrating high tone of PFM.    Time  5    Period  Weeks    Status  Achieved    Target Date  03/06/19      PT SHORT TERM GOAL #3   Title  Patient will demonstrate improved sitting and standing posture to demonstrate learning and decrease stress on the pelvic floor with functional activity.    Baseline  Pt. demonstrates severe posterior pelvic tilt, L thoracic/R lumbar scoliosis, and R anterior/L posterior pelvic obliquity. Pt is able to attain improved posture by ~ 85% but with fatigue reverts to poor posture.    Time  5    Period  Weeks    Status  Partially Met    Target Date  03/06/19        PT Long Term Goals - 02/09/19 0819      PT LONG TERM GOAL #1   Title  Patient will describe pain no greater than 3/10 during Sitting for 2 hours to demonstrate improved functional ability.    Baseline  Max 9/10, worst with prolonged sitting. (Pt. ignoring pain at initial eval)    Time  10    Period  Weeks    Status  On-going    Target Date  04/20/19      PT LONG TERM GOAL #2   Title  Pt. will report walking for 30 min. without an increase in pain >2/10 to allow for return to exercise and demonstrate improved function.    Baseline  walking ~ 10 min with pain ~ 6/10    Time  10    Period  Weeks    Status  On-going    Target Date  04/20/19      PT LONG TERM GOAL #3   Title  Pt. Will be able to cath without pain and will be cleared by physiscian to d/c self-cath due to decreased residual volume with bladder emptying.    Baseline  required to cath 3 times per day, painful each time.    Time  10    Period  Weeks    Status  On-going    Target Date  04/20/19  PT LONG TERM GOAL #4   Title  Patient will score less than or equal to 40% on the Female NIH-CPSI and 30% on the [facility] to demonstrate a reduction in pain, urinary symptoms, and an improved quality of life.    Baseline  Female NIH-CPSI: 38/43  (88%) , PDI: 45/75 (60%); PDI 44/75 (59%) and Female NIH-CPSI: 27/43 (64%) on 02-08-19    Time  10    Period  Weeks    Status  Partially Met    Target Date  04/20/19      PT LONG TERM GOAL #5   Title  Patient will demonstrate increased step length with reciprical arm swing, and demonstrate no scissoring steps.    Baseline  Pt currently demonstrated scissoring steppage on R LE, increased trunk rotation and deep core engagement with reciprical walking poles.    Time  10    Period  Weeks    Status  On-going    Target Date  04/20/19            Plan - 03/09/19 1151    Clinical Impression Statement  Pt showed less R trunk sideflexion today after last session's manual Tx that was focused on lengthening concave side of scoliotic curve. Pt reports her radiating pain down L LE has been less in intensity upon waking.  Focused on her upper cross syndrome to improve cervical alignment, increase cervical extension endurance mm, scapular depression/ retraction for a better stacked posture. Today's manual Tx which pt tolerated addressed the rotational component to her spine. Pt was able to demo shoulder abduction with increased ROM with better scapular mobility/ depression/ retraction B.   Obturator internus on L mm release and stretches decreased L radicular pain which allowed for pt to continue performing deep core level 2. Anticipate deep core level 1 and 2 will help promote postural stability. Plan to address L SIJ and pelvic girdle as pt continues to demo posterior position of pelvis. Pt continues to benefit from skilled PT.    Personal Factors and Comorbidities  Age;Comorbidity 3+    Comorbidities  Osteoporosis, scoliosis, Arthritis, COPD, recent lumbosacral fusion for spondylolisthesis.    Examination-Activity Limitations  Toileting;Sit;Stand;Bend;Lift;Carry    Examination-Participation Restrictions  Interpersonal Relationship;Yard Work;Cleaning;Laundry    Stability/Clinical Decision Making   Unstable/Unpredictable    Rehab Potential  Good    PT Frequency  2x / week    PT Duration  Other (comment)    PT Treatment/Interventions  ADLs/Self Care Home Management;Aquatic Therapy;Moist Heat;Electrical Stimulation;Traction;Therapeutic activities;Functional mobility training;Stair training;Gait training;Therapeutic exercise;Balance training;Neuromuscular re-education;Patient/family education;Manual techniques;Dry needling;Passive range of motion;Scar mobilization;Taping    PT Next Visit Plan  Review HEP with demonstration for A and B days; TPDN to L gastroc/soleus, adductors and illipsoas. Gait training with walking sticks going backwards; ** 10-22 improving hip ER (R>L)    PT Home Exercise Plan  Deep core level 1; diaphragmatic breathing, hip-flexor stretch, side-stretch, pelvic tilts in seated, TA in mod-quad, diaphragmatic breathing, supine butterfly, bow-and-arrow, posterior fourchette massage, urge-suppression, squatty potty, doorway chest-stretch, arm ABD slides, biased lumbar twist stretch. Plan adjusted for 2 split days. Added green tband backwards walking exercises, seated hasmtring stretch    Consulted and Agree with Plan of Care  Patient       Patient will benefit from skilled therapeutic intervention in order to improve the following deficits and impairments:  Abnormal gait, Decreased balance, Increased muscle spasms, Decreased range of motion, Decreased scar mobility, Improper body mechanics, Decreased coordination, Decreased strength, Increased fascial restricitons, Impaired  flexibility, Postural dysfunction, Pain  Visit Diagnosis: Abnormal posture  Other muscle spasm     Problem List Patient Active Problem List   Diagnosis Date Noted  . Chronic UTI 12/06/2018  . Status post lumbar surgery 12/06/2018  . Spondylolisthesis, lumbar region 09/26/2018  . Aortic atherosclerosis ([place]) 08/31/2018  . CAD (coronary artery disease) 02/23/2018  . Fatigue 01/20/2018  . Short of  breath on exertion 10/17/2017  . Advanced care planning/counseling discussion 03/28/2017  . Varicose veins of both lower extremities with complications 42/70/6237  . Arthritis 12/09/2016  . Vaginal atrophy 09/10/2015  . Menopausal hot flushes 09/10/2015  . Menopause 09/10/2015  . Onychomycosis due to dermatophyte 04/07/2015  . Bursitis of right shoulder 02/26/2015  . Environmental and seasonal allergies 02/26/2015  . Other allergic rhinitis 02/26/2015  . Osteoporosis 11/26/2014  . COPD (chronic obstructive pulmonary disease) ([place]) 11/26/2014  . Fuchs' corneal dystrophy 11/26/2014  . Essential (primary) hypertension 11/26/2014  . Hyperlipidemia 11/26/2014  . Chronic kidney disease, stage III (moderate) ([place]) 11/26/2014  . Hypothyroidism 11/26/2014  . Low back pain 11/26/2014  . Endothelial corneal dystrophy 11/26/2014    Jerl Mina ,PT, DPT, E-RYT  03/09/2019, 11:55 AM  [place] MAIN [facility] SERVICES [address] [place], [place], [address] Phone: [phone]   Fax:  [phone]  Name: Julia Deleon MRN: [id number] Date of Birth: 1945/06/25

## 2019-03-09 NOTE — Patient Instructions (Signed)
Wake up rountine  Without pillow, neck does not lift up _neck range of motion 6 direction Chin up and down Ear to shoulder Rotation 5 reps each   _angel wings/ bat wings   Inhale armsraise arms like making snow angel , keep arm dragging on the bed Exhale bend exhale, lower elbow down first, shoulders down  10reps    _ Stretch for pelvic floor   Knees bent  "v heels slide away and then back toward buttocks and then rock knee to slight ,  slide heel along at 11 o clock away from buttocks   10 reps    _____  Back stretch, hands on bed, pulling butt back, knees slightly bent   ____   During day,  chin tucks , shoulders down, arms by hips to sit upright and correct turtle head posture

## 2019-03-13 ENCOUNTER — Ambulatory Visit: Payer: Medicare Other | Attending: Urology | Admitting: Physical Therapy

## 2019-03-13 ENCOUNTER — Other Ambulatory Visit: Payer: Self-pay

## 2019-03-13 DIAGNOSIS — M2170 Unequal limb length (acquired), unspecified site: Secondary | ICD-10-CM | POA: Diagnosis not present

## 2019-03-13 DIAGNOSIS — R293 Abnormal posture: Secondary | ICD-10-CM

## 2019-03-13 DIAGNOSIS — M62838 Other muscle spasm: Secondary | ICD-10-CM | POA: Diagnosis not present

## 2019-03-13 DIAGNOSIS — R2689 Other abnormalities of gait and mobility: Secondary | ICD-10-CM | POA: Insufficient documentation

## 2019-03-13 NOTE — Patient Instructions (Addendum)
Pelvic Floor Stretch V slides are done with knee bend   Forward, back ,  bend then slide    ____  Pt can only walk on the treadmill on levelled ground for 10 min and then notices the radiating pain goes to the knee.   1.5  mph  0:00 -2:00  1.7 mph 2:01 -4:00 2.0 mph 4:01 -6:00  1.7 mph  6:01 -8:00 1.5  mph  8:01 -10:00   ___  Pelvic floor strengthening This week,  adding quick pelvic squeeze on exhale with  Deep core level 1 breathing  ( 10 reps)   No need to do squeezes with Deep core level 2 ( moving knee)  ____    To alleviate the L radiating pain Repeated movements of sidelean to the R  10 x 3 reps

## 2019-03-14 NOTE — Therapy (Addendum)
[place] MAIN [facility] SERVICES [address] [place], [place], [address] Phone: [phone]   Fax:  [phone]  Physical Therapy Treatment  Patient Details  Name: Julia Deleon MRN: [id number] Date of Birth: [birth year] Referring Provider (PT): Dr. Maryan Puls   Encounter Date: 03/13/2019  PT End of Session - 03/13/19 1313    Visit Number  32    Number of Visits  33    Date for PT Re-Evaluation  04/19/19    Authorization Type  Medicare    Authorization Time Period  through 04/19/19    Authorization - Visit Number  8    Authorization - Number of Visits  20    PT Start Time  9528    PT Stop Time  1400    PT Time Calculation (min)  58 min    Activity Tolerance  Patient tolerated treatment well;No increased pain    Behavior During Therapy  WFL for tasks assessed/performed       Past Medical History:  Diagnosis Date  . Arthritis   . COPD (chronic obstructive pulmonary disease) ([place])   . Family history of adverse reaction to anesthesia    father was slow to wake up  . Fuch's endothelial dystrophy   . Hyperlipidemia   . Hypothyroidism   . PONV (postoperative nausea and vomiting)    slow to wake up and PONV    Past Surgical History:  Procedure Laterality Date  . APPENDECTOMY    . BACK SURGERY  09/26/2018   L4-5 PLIF by Dr. Arnoldo Morale  . CARDIAC CATHETERIZATION    . CHOLECYSTECTOMY    . EYE SURGERY    . FOOT SURGERY Right    pin removed left  . laser vein surgery    . NASAL SINUS SURGERY    . RIGHT/LEFT HEART CATH AND CORONARY ANGIOGRAPHY N/A 12/06/2017   Procedure: RIGHT/LEFT HEART CATH AND CORONARY ANGIOGRAPHY;  Surgeon: Yolonda Kida, MD;  Location: [place] [facility];  Service: Cardiovascular;  Laterality: N/A;  . TONSILLECTOMY      There were no vitals filed for this visit.  Subjective Assessment - 03/13/19 1309    Subjective  Pt reports her radiating pain was better after last session but it returned after she went to  bed. Pt noticed when she was doing the laying on her back and bringing her leg out and she feels it gets tight.  Pt got acupuncture yesterday and did the same routine at the gym earlier. Pt woke up this morning with more pain in her back and the radiating pain down the L leg was at 8/10 instead 7/10.  The pain most of the time upon waking is at the level of the knee. Sometimes it does go below the knee. Pt can only walk on the treadmill on levelled ground for 10 min and then notices the radiating pain goes to the knee. She starts at 1.5 m -2.0 mph and after 1 min, she progresses  2.3 mph.         [facility] PT Assessment - 03/14/19 2043      Coordination   Fine Motor Movements are Fluid and Coordinated  --   pelvic tilt propioception no difficulty.     Sit to Stand   Comments  pain with standing, demo'd posterior tilt, increased lumbar lordosis/ posterior R rotation of thorax       Palpation   Spinal mobility   R paraspinal more posterior, R shoulder mroe IR  Ambulation/Gait   Gait Comments  more upright posture, less thoracic kyphosis, less R trunk lean, posterior tilt of pelvis                  Pelvic Floor Special Questions - 03/14/19 2009    External Palpation  noted increased obt int tightness L         [facility] Adult PT Treatment/Exercise - 03/14/19 2047      Therapeutic Activites    Other Therapeutic Activities  discussed proper warm and dool down with treadmill walking to minimize radiating pain/ tightness . Explained progression with HEP for scoliosis.        Neuro Re-ed    Neuro Re-ed Details   cued for proper lengthening of pelvic floor and coordination of deep core      Manual Therapy   Manual therapy comments  MWM at L pelvic floor ( obt int) , R QL  STM                PT Short Term Goals - 02/13/19 1454      PT SHORT TERM GOAL #1   Title  Patient will demonstrate improved pelvic alignment and balance of musculature surrounding the pelvis to  facilitate decreased PFM spasms and decrease pelvic pain.    Baseline  Pt. demonstrates severe posterior pelvic tilt, L thoracic/R lumbar scoliosis, and R anterior/L posterior pelvic obliquity. Pt. has demonstrated improved pelvic alignment and posture but has not been able to sustain long-term due to continued lack of postural strength.    Time  5    Period  Weeks    Status  Partially Met    Target Date  03/06/19      PT SHORT TERM GOAL #2   Title  Patient will demonstrate a coordinated contraction, relaxation, and bulge of the pelvic floor muscles to demonstrate functional recruitment and motion and allow for bladder emptying.    Baseline  Pt. is unable to fully empty bladder and describes pain with catheterization, demonstrating high tone of PFM.    Time  5    Period  Weeks    Status  Achieved    Target Date  03/06/19      PT SHORT TERM GOAL #3   Title  Patient will demonstrate improved sitting and standing posture to demonstrate learning and decrease stress on the pelvic floor with functional activity.    Baseline  Pt. demonstrates severe posterior pelvic tilt, L thoracic/R lumbar scoliosis, and R anterior/L posterior pelvic obliquity. Pt is able to attain improved posture by ~ 85% but with fatigue reverts to poor posture.    Time  5    Period  Weeks    Status  Partially Met    Target Date  03/06/19        PT Long Term Goals - 03/13/19 1400      PT LONG TERM GOAL #1   Title  Patient will describe pain no greater than 3/10 during Sitting for 2 hours to demonstrate improved functional ability.    Baseline  Max 9/10, worst with prolonged sitting. (Pt. ignoring pain at initial eval)    Time  10    Period  Weeks    Status  On-going      PT LONG TERM GOAL #2   Title  Pt. will report walking for 30 min. without an increase in pain >2/10 to allow for return to exercise and demonstrate improved function.    Baseline  walking ~  10 min with pain ~ 6/10    Time  10    Period  Weeks     Status  On-going      PT LONG TERM GOAL #3   Title  Pt. Will be able to cath without pain and will be cleared by physiscian to d/c self-cath due to decreased residual volume with bladder emptying.    Baseline  required to cath 3 times per day, painful each time.    Time  10    Period  Weeks    Status  Achieved      PT LONG TERM GOAL #4   Title  Patient will score less than or equal to 40% on the Female NIH-CPSI and 30% on the [facility] to demonstrate a reduction in pain, urinary symptoms, and an improved quality of life.    Baseline  Female NIH-CPSI: 38/43 (88%) , PDI: 45/75 (60%); PDI 44/75 (59%) and Female NIH-CPSI: 27/43 (64%) on 02-08-19    Time  10    Period  Weeks    Status  Partially Met      PT LONG TERM GOAL #5   Title  Patient will demonstrate increased step length with reciprical arm swing, and demonstrate no scissoring steps.    Baseline  Pt currently demonstrated scissoring steppage on R LE, increased trunk rotation and deep core engagement with reciprical walking poles.    Time  10    Period  Weeks    Status  Achieved      Additional Long Term Goals   Additional Long Term Goals  Yes      PT LONG TERM GOAL #6   Title  Pt will report no radiating pain down LLE after walking treadmill for 10 min in order to progress with aerobic exercises    Time  8    Period  Weeks    Status  New    Target Date  05/08/19            Plan - 03/13/19 1355    Clinical Impression Statement  Pt's back pain decreased from 8/10 to 7/10 after Tx today. Applied pelvic floor mm release to promote more pelvic floor coordination with deep core system. Manual Tx was applied independent from other interventions.  Applied repeated movements in R side flexion which decreased pain. L trunk rotation caused more radiating pain indicating side flexion movement relieved nerve impingement. Pt shows good carry over with less forward head, less thoracic kyphosis which will yield less spinal stenosis and minimize  disc herniation / spondylolisthesis conditions.     Worked on screening/ minimizing multiple factors to her radiating pain: _ Discussed with pt to ask acupuncturist if they changed their Tx because pt experienced increased pain this morning after receiving Tx yesterday. Pt plans to decrease her acupuncture visits because it has helped her to eliminate urine middle of the day and she no longer has problems with urination.   _Further inquired pt's treadmill routine as pt states she gets radiating pain after walking 10 min. Educated pt to modify treadmill walking routine with warm up at slower speed and not advance so quickly to faster speed so quickly in order to minimize radiating leg pain.   Plan to minimize worsening of pt's spinal deficits with more inquire on her fitness routine with fitness trainer, continue to progress with deep core/ thoracolumbar systems, and assess lower kinetic chain at next sessions.   MRI notes: L3-4:  Small broad-based disc bulge with a  small central subligamentous disc protrusion. Hypertrophy of the ligamentum flavum and facet joints create minimal spinal stenosis  L4-5: Moderate bilateral facet arthritis. 3 mm spondylolisthesis. Diffuse small broad-based bulge of the disc.  L5-S1: Severe bilateral facet arthritis. 2 mm spondylolisthesis. Minimal broad-based disc bulge slightly asymmetric into the left neural foramen. However, the L5 nerves appear to exit without impingement. No compression of the thecal sac.  Pt continues to benefit from skilled PT    Personal Factors and Comorbidities  Age;Comorbidity 3+    Comorbidities  Osteoporosis, scoliosis, Arthritis, COPD, recent lumbosacral fusion for spondylolisthesis.    Examination-Activity Limitations  Toileting;Sit;Stand;Bend;Lift;Carry    Examination-Participation Restrictions  Interpersonal Relationship;Yard Work;Cleaning;Laundry    Stability/Clinical Decision Making  Unstable/Unpredictable    Rehab Potential   Good    PT Frequency  2x / week    PT Duration  Other (comment)    PT Treatment/Interventions  ADLs/Self Care Home Management;Aquatic Therapy;Moist Heat;Electrical Stimulation;Traction;Therapeutic activities;Functional mobility training;Stair training;Gait training;Therapeutic exercise;Balance training;Neuromuscular re-education;Patient/family education;Manual techniques;Dry needling;Passive range of motion;Scar mobilization;Taping    PT Next Visit Plan  Review HEP with demonstration for A and B days; TPDN to L gastroc/soleus, adductors and illipsoas. Gait training with walking sticks going backwards; ** 10-22 improving hip ER (R>L)    PT Home Exercise Plan  Deep core level 1; diaphragmatic breathing, hip-flexor stretch, side-stretch, pelvic tilts in seated, TA in mod-quad, diaphragmatic breathing, supine butterfly, bow-and-arrow, posterior fourchette massage, urge-suppression, squatty potty, doorway chest-stretch, arm ABD slides, biased lumbar twist stretch. Plan adjusted for 2 split days. Added green tband backwards walking exercises, seated hasmtring stretch    Consulted and Agree with Plan of Care  Patient       Patient will benefit from skilled therapeutic intervention in order to improve the following deficits and impairments:  Abnormal gait, Decreased balance, Increased muscle spasms, Decreased range of motion, Decreased scar mobility, Improper body mechanics, Decreased coordination, Decreased strength, Increased fascial restricitons, Impaired flexibility, Postural dysfunction, Pain  Visit Diagnosis: Abnormal posture  Other muscle spasm     Problem List Patient Active Problem List   Diagnosis Date Noted  . Chronic UTI 12/06/2018  . Status post lumbar surgery 12/06/2018  . Spondylolisthesis, lumbar region 09/26/2018  . Aortic atherosclerosis ([place]) 08/31/2018  . CAD (coronary artery disease) 02/23/2018  . Fatigue 01/20/2018  . Short of breath on exertion 10/17/2017  . Advanced care  planning/counseling discussion 03/28/2017  . Varicose veins of both lower extremities with complications 91/66/0600  . Arthritis 12/09/2016  . Vaginal atrophy 09/10/2015  . Menopausal hot flushes 09/10/2015  . Menopause 09/10/2015  . Onychomycosis due to dermatophyte 04/07/2015  . Bursitis of right shoulder 02/26/2015  . Environmental and seasonal allergies 02/26/2015  . Other allergic rhinitis 02/26/2015  . Osteoporosis 11/26/2014  . COPD (chronic obstructive pulmonary disease) ([place]) 11/26/2014  . Fuchs' corneal dystrophy 11/26/2014  . Essential (primary) hypertension 11/26/2014  . Hyperlipidemia 11/26/2014  . Chronic kidney disease, stage III (moderate) ([place]) 11/26/2014  . Hypothyroidism 11/26/2014  . Low back pain 11/26/2014  . Endothelial corneal dystrophy 11/26/2014    Jerl Mina ,PT, DPT, E-RYT  03/14/2019, 8:57 PM  [place] MAIN [facility] SERVICES [address] [place], [place], [address] Phone: [phone]   Fax:  [phone]  Name: Raizy C Knowles MRN: [id number] Date of Birth: 03-Aug-1945

## 2019-03-15 DIAGNOSIS — J301 Allergic rhinitis due to pollen: Secondary | ICD-10-CM | POA: Diagnosis not present

## 2019-03-16 ENCOUNTER — Other Ambulatory Visit: Payer: Self-pay

## 2019-03-16 ENCOUNTER — Ambulatory Visit: Payer: Medicare Other | Admitting: Physical Therapy

## 2019-03-16 DIAGNOSIS — M62838 Other muscle spasm: Secondary | ICD-10-CM

## 2019-03-16 DIAGNOSIS — R293 Abnormal posture: Secondary | ICD-10-CM

## 2019-03-16 DIAGNOSIS — I10 Essential (primary) hypertension: Secondary | ICD-10-CM | POA: Diagnosis not present

## 2019-03-16 DIAGNOSIS — R2689 Other abnormalities of gait and mobility: Secondary | ICD-10-CM | POA: Diagnosis not present

## 2019-03-16 DIAGNOSIS — E039 Hypothyroidism, unspecified: Secondary | ICD-10-CM | POA: Diagnosis not present

## 2019-03-16 DIAGNOSIS — M2170 Unequal limb length (acquired), unspecified site: Secondary | ICD-10-CM | POA: Diagnosis not present

## 2019-03-16 NOTE — Therapy (Addendum)
[place] MAIN [facility] SERVICES [address] [place], [place], [address] Phone: [phone]   Fax:  [phone]  Physical Therapy Treatment  Patient Details  Name: Julia Deleon MRN: [id number] Date of Birth: 10-10-1945 Referring Provider (PT): Dr. Maryan Puls   Encounter Date: 03/16/2019  PT End of Session - 03/20/19 1128    Date for PT Re-Evaluation  04/19/19    Authorization Type  Medicare    Authorization Time Period  through 04/19/19    Authorization - Visit Number  9    Authorization - Number of Visits  20    Activity Tolerance  Patient tolerated treatment well;No increased pain    Behavior During Therapy  WFL for tasks assessed/performed       Past Medical History:  Diagnosis Date  . Arthritis   . COPD (chronic obstructive pulmonary disease) ([place])   . Family history of adverse reaction to anesthesia    father was slow to wake up  . Fuch's endothelial dystrophy   . Hyperlipidemia   . Hypothyroidism   . PONV (postoperative nausea and vomiting)    slow to wake up and PONV    Past Surgical History:  Procedure Laterality Date  . APPENDECTOMY    . BACK SURGERY  09/26/2018   L4-5 PLIF by Dr. Arnoldo Morale  . CARDIAC CATHETERIZATION    . CHOLECYSTECTOMY    . EYE SURGERY    . FOOT SURGERY Right    pin removed left  . laser vein surgery    . NASAL SINUS SURGERY    . RIGHT/LEFT HEART CATH AND CORONARY ANGIOGRAPHY N/A 12/06/2017   Procedure: RIGHT/LEFT HEART CATH AND CORONARY ANGIOGRAPHY;  Surgeon: Yolonda Kida, MD;  Location: [place] [facility];  Service: Cardiovascular;  Laterality: N/A;  . TONSILLECTOMY      There were no vitals filed for this visit.  Subjective Assessment - 03/20/19 1126    Subjective  Pt reports no increased pain after last session.  Pt drove 4 hours round trip. Pt did not have noticable sciatic on the way there but noticed it during the last hour of the return trip. Pt wore the back brace.  Pt feels  radaiting pain 6-7/10 today in LLE.         [facility] PT Assessment - 03/20/19 1127      Palpation   Palpation comment  IT band proximal. distal attachments and mm belly on L       Ambulation/Gait   Gait Comments  R trunk lean, less throacic kyphosis,                    [facility] Adult PT Treatment/Exercise - 03/20/19 1127      Neuro Re-ed    Neuro Re-ed Details   cued for proper lengthening of pelvic floor and coordination of deep core. modifications to car seat explanation and demonstration with cues for more thoracic extension and cervical retraction               PT Short Term Goals - 03/16/19 1146      PT SHORT TERM GOAL #1   Title  Patient will demonstrate improved pelvic alignment and balance of musculature surrounding the pelvis to facilitate decreased PFM spasms and decrease pelvic pain.    Baseline  Pt. demonstrates severe posterior pelvic tilt, L thoracic/R lumbar scoliosis, and R anterior/L posterior pelvic obliquity. Pt. has demonstrated improved pelvic alignment and posture but has not been able to sustain  long-term due to continued lack of postural strength.    Time  5    Period  Weeks    Status  Achieved    Target Date  03/06/19      PT SHORT TERM GOAL #2   Title  Patient will demonstrate a coordinated contraction, relaxation, and bulge of the pelvic floor muscles to demonstrate functional recruitment and motion and allow for bladder emptying.    Baseline  Pt. is unable to fully empty bladder and describes pain with catheterization, demonstrating high tone of PFM.    Time  5    Period  Weeks    Status  Achieved    Target Date  03/06/19      PT SHORT TERM GOAL #3   Title  Patient will demonstrate improved sitting and standing posture to demonstrate learning and decrease stress on the pelvic floor with functional activity.    Baseline  Pt. demonstrates severe posterior pelvic tilt, L thoracic/R lumbar scoliosis, and R anterior/L posterior pelvic  obliquity. Pt is able to attain improved posture in upper spine, less forward head, less thoracic kyphosis but posterior tilt is still present    Time  5    Period  Weeks    Status  Partially Met    Target Date  05/15/19        PT Long Term Goals - 03/13/19 1400      PT LONG TERM GOAL #1   Title  Patient will describe pain no greater than 3/10 during Sitting for 2 hours to demonstrate improved functional ability.    Baseline  Max 9/10, worst with prolonged sitting. (Pt. ignoring pain at initial eval)    Time  10    Period  Weeks    Status  On-going      PT LONG TERM GOAL #2   Title  Pt. will report walking for 30 min. without an increase in pain >2/10 to allow for return to exercise and demonstrate improved function.    Baseline  walking ~ 10 min with pain ~ 6/10    Time  10    Period  Weeks    Status  On-going      PT LONG TERM GOAL #3   Title  Pt. Will be able to cath without pain and will be cleared by physiscian to d/c self-cath due to decreased residual volume with bladder emptying.    Baseline  required to cath 3 times per day, painful each time.    Time  10    Period  Weeks    Status  Achieved      PT LONG TERM GOAL #4   Title  Patient will score less than or equal to 40% on the Female NIH-CPSI and 30% on the [facility] to demonstrate a reduction in pain, urinary symptoms, and an improved quality of life.    Baseline  Female NIH-CPSI: 38/43 (88%) , PDI: 45/75 (60%); PDI 44/75 (59%) and Female NIH-CPSI: 27/43 (64%) on 02-08-19    Time  10    Period  Weeks    Status  Partially Met      PT LONG TERM GOAL #5   Title  Patient will demonstrate increased step length with reciprical arm swing, and demonstrate no scissoring steps.    Baseline  Pt currently demonstrated scissoring steppage on R LE, increased trunk rotation and deep core engagement with reciprical walking poles.    Time  10    Period  Weeks  Status  Achieved      Additional Long Term Goals   Additional Long Term  Goals  Yes      PT LONG TERM GOAL #6   Title  Pt will report no radiating pain down LLE after walking treadmill for 10 min in order to progress with aerobic exercises    Time  8    Period  Weeks    Status  New    Target Date  05/08/19            Plan - 03/16/19 1128    Clinical Impression Statement  Pt showed good improvement with being able to drive a 4 hours round trip with only increased pain on the last 1hour on on her return  trip. Focused on decreasing L radiating pain to today which decreased from 6-7/10 to 2-3/10. Applied gentle manual techniques that pt tolerated better than trigger point release which can caused flinching pain over L IT band. Manual Tx was applied independently from other interventions. Plan to apply regional interdependent approach to address L lateral thigh radiating pain and assess feet at next session to minimize return of tightness on IT band. Pt continues to benefit from skilled PT.    Personal Factors and Comorbidities  Age;Comorbidity 3+    Comorbidities  Osteoporosis, scoliosis, Arthritis, COPD, recent lumbosacral fusion for spondylolisthesis.    Examination-Activity Limitations  Toileting;Sit;Stand;Bend;Lift;Carry    Examination-Participation Restrictions  Interpersonal Relationship;Yard Work;Cleaning;Laundry    Stability/Clinical Decision Making  Unstable/Unpredictable    Rehab Potential  Good    PT Frequency  2x / week    PT Duration  Other (comment)    PT Treatment/Interventions  ADLs/Self Care Home Management;Aquatic Therapy;Moist Heat;Electrical Stimulation;Traction;Therapeutic activities;Functional mobility training;Stair training;Gait training;Therapeutic exercise;Balance training;Neuromuscular re-education;Patient/family education;Manual techniques;Dry needling;Passive range of motion;Scar mobilization;Taping    PT Next Visit Plan  Review HEP with demonstration for A and B days; TPDN to L gastroc/soleus, adductors and illipsoas. Gait training  with walking sticks going backwards; ** 10-22 improving hip ER (R>L)    PT Home Exercise Plan  Deep core level 1; diaphragmatic breathing, hip-flexor stretch, side-stretch, pelvic tilts in seated, TA in mod-quad, diaphragmatic breathing, supine butterfly, bow-and-arrow, posterior fourchette massage, urge-suppression, squatty potty, doorway chest-stretch, arm ABD slides, biased lumbar twist stretch. Plan adjusted for 2 split days. Added green tband backwards walking exercises, seated hasmtring stretch    Consulted and Agree with Plan of Care  Patient       Patient will benefit from skilled therapeutic intervention in order to improve the following deficits and impairments:  Abnormal gait, Decreased balance, Increased muscle spasms, Decreased range of motion, Decreased scar mobility, Improper body mechanics, Decreased coordination, Decreased strength, Increased fascial restricitons, Impaired flexibility, Postural dysfunction, Pain  Visit Diagnosis: Abnormal posture  Other muscle spasm     Problem List Patient Active Problem List   Diagnosis Date Noted  . Chronic UTI 12/06/2018  . Status post lumbar surgery 12/06/2018  . Spondylolisthesis, lumbar region 09/26/2018  . Aortic atherosclerosis ([facility]) 08/31/2018  . CAD (coronary artery disease) 02/23/2018  . Fatigue 01/20/2018  . Short of breath on exertion 10/17/2017  . Advanced care planning/counseling discussion 03/28/2017  . Varicose veins of both lower extremities with complications 01/04/2017  . Arthritis 12/09/2016  . Vaginal atrophy 09/10/2015  . Menopausal hot flushes 09/10/2015  . Menopause 09/10/2015  . Onychomycosis due to dermatophyte 04/07/2015  . Bursitis of right shoulder 02/26/2015  . Environmental and seasonal allergies 02/26/2015  . Other allergic rhinitis 02/26/2015  .   Osteoporosis 11/26/2014  . COPD (chronic obstructive pulmonary disease) ([place]) 11/26/2014  . Fuchs' corneal dystrophy 11/26/2014  . Essential (primary)  hypertension 11/26/2014  . Hyperlipidemia 11/26/2014  . Chronic kidney disease, stage III (moderate) ([place]) 11/26/2014  . Hypothyroidism 11/26/2014  . Low back pain 11/26/2014  . Endothelial corneal dystrophy 11/26/2014    Jerl Mina ,PT, DPT, E-RYT  03/20/2019, 11:48 AM  [place] MAIN [facility] SERVICES [address] [place], [place], [address] Phone: [phone]   Fax:  [phone]  Name: Julia Deleon MRN: [id number] Date of Birth: 1946/03/24

## 2019-03-16 NOTE — Patient Instructions (Addendum)
Car seat modification  Continue with exercises

## 2019-03-19 ENCOUNTER — Ambulatory Visit: Payer: Medicare Other | Admitting: Physical Therapy

## 2019-03-19 ENCOUNTER — Other Ambulatory Visit: Payer: Self-pay

## 2019-03-19 DIAGNOSIS — R2689 Other abnormalities of gait and mobility: Secondary | ICD-10-CM | POA: Diagnosis not present

## 2019-03-19 DIAGNOSIS — M62838 Other muscle spasm: Secondary | ICD-10-CM

## 2019-03-19 DIAGNOSIS — R293 Abnormal posture: Secondary | ICD-10-CM | POA: Diagnosis not present

## 2019-03-19 DIAGNOSIS — M2170 Unequal limb length (acquired), unspecified site: Secondary | ICD-10-CM | POA: Diagnosis not present

## 2019-03-19 NOTE — Patient Instructions (Addendum)
Shoe lift in R shoe   Keep with treadmill walking 7 min

## 2019-03-20 ENCOUNTER — Ambulatory Visit: Payer: Medicare Other | Admitting: Physical Therapy

## 2019-03-20 NOTE — Therapy (Signed)
[place] MAIN [facility] SERVICES [address] [place], [place], [address] Phone: [phone]   Fax:  [phone]  Physical Therapy Treatment / Progress Note  reporting from 02/13/19 - 03/19/19   Patient Details  Name: Julia Deleon MRN: [id number] Date of Birth: May 18, 1945 Referring Provider (PT): Dr. Maryan Puls   Encounter Date: 03/19/2019  PT End of Session - 03/20/19 1138    Visit Number  [id number]    Date for PT Re-Evaluation  04/19/19    Authorization Type  Medicare    Authorization Time Period  through 04/19/19    Authorization - Visit Number  10    Authorization - Number of Visits  20    PT Start Time  1696    PT Stop Time  1109    PT Time Calculation (min)  46 min    Activity Tolerance  Patient tolerated treatment well;No increased pain    Behavior During Therapy  WFL for tasks assessed/performed       Past Medical History:  Diagnosis Date  . Arthritis   . COPD (chronic obstructive pulmonary disease) ([place])   . Family history of adverse reaction to anesthesia    father was slow to wake up  . Fuch's endothelial dystrophy   . Hyperlipidemia   . Hypothyroidism   . PONV (postoperative nausea and vomiting)    slow to wake up and PONV    Past Surgical History:  Procedure Laterality Date  . APPENDECTOMY    . BACK SURGERY  09/26/2018   L4-5 PLIF by Dr. Arnoldo Morale  . CARDIAC CATHETERIZATION    . CHOLECYSTECTOMY    . EYE SURGERY    . FOOT SURGERY Right    pin removed left  . laser vein surgery    . NASAL SINUS SURGERY    . RIGHT/LEFT HEART CATH AND CORONARY ANGIOGRAPHY N/A 12/06/2017   Procedure: RIGHT/LEFT HEART CATH AND CORONARY ANGIOGRAPHY;  Surgeon: Yolonda Kida, MD;  Location: [facility];  Service: Cardiovascular;  Laterality: N/A;  . TONSILLECTOMY      There were no vitals filed for this visit.  Subjective Assessment - 03/20/19 1145    Subjective  Pt reported after last session, she felt better, she slept better  and woke up feeling better less radiating pain to a 5-6/10 when typically it is 7-8/10. That next day, pt went to the gym, walked 7 min at the level as recommended, and there was tightness but no worse pain like it typically occurs when she pushes past 10 min. The next morning on Sunday, pt woke up with the same level of pain again.         [facility] PT Assessment - 03/20/19 1150      Observation/Other Assessments   Observations  L hallus valgus       Strength   Overall Strength Comments  DF/EV R 3+/5, L 4/5       Palpation   Spinal mobility  no more R paraspinal mm tightness     Palpation comment  tightness at L leg, tightness at IT band , hypomobility at tib/fib joint ( post Tx improved mobility)        Bed Mobility   Bed Mobility  --   cramping in L leg with rolling to sit        Gait Comments  less R trunk lean with shoe lift in R shoe,  decreased stance on R LE without shoe lift,  [place] [facility] PT Treatment/Exercise - 03/20/19 1150               Manual Therapy   Manual therapy comments  facsial release over ITBand L, AP/PA mob at tib/fib , mm release at lateral leg         Therapeutic Activities:  Shoe lift in R shoe , assessed gait and explained with anatomy pictures on relationship between L hallus valgus to tight IT band. Explained how deep core strength and scoliosis in spine and pelvic alignment have been addressed and plan to progress to lower kinetic chain to minimize L lateral thigh pain and to correct pelvic tilt.        PT Short Term Goals - 03/20/19 1146      PT SHORT TERM GOAL #1   Title  Patient will demonstrate improved pelvic alignment and balance of musculature surrounding the pelvis to facilitate decreased PFM spasms and decrease pelvic pain.    Baseline  Pt. demonstrates severe posterior pelvic tilt, L thoracic/R lumbar scoliosis, and R anterior/L posterior pelvic obliquity. Pt. has demonstrated improved pelvic alignment and  posture but has not been able to sustain long-term due to continued lack of postural strength.    Time  5    Period  Weeks    Status  Achieved    Target Date  03/06/19      PT SHORT TERM GOAL #2   Title  Patient will demonstrate a coordinated contraction, relaxation, and bulge of the pelvic floor muscles to demonstrate functional recruitment and motion and allow for bladder emptying.    Baseline  Pt. is unable to fully empty bladder and describes pain with catheterization, demonstrating high tone of PFM.    Time  5    Period  Weeks    Status  Achieved    Target Date  03/06/19      PT SHORT TERM GOAL #3   Title  Patient will demonstrate improved sitting and standing posture to demonstrate learning and decrease stress on the pelvic floor with functional activity.    Baseline  Pt. demonstrates severe posterior pelvic tilt, L thoracic/R lumbar scoliosis, and R anterior/L posterior pelvic obliquity. Pt is able to attain improved posture in upper spine, less forward head, less thoracic kyphosis but posterior tilt is still present    Time  5    Period  Weeks    Status  Partially Met    Target Date  05/15/19        PT Long Term Goals - 03/13/19 1400      PT LONG TERM GOAL #1   Title  Patient will describe pain no greater than 3/10 during Sitting for 2 hours to demonstrate improved functional ability.    Baseline  Max 9/10, worst with prolonged sitting. (Pt. ignoring pain at initial eval)    Time  10    Period  Weeks    Status  On-going      PT LONG TERM GOAL #2   Title  Pt. will report walking for 30 min. without an increase in pain >2/10 to allow for return to exercise and demonstrate improved function.    Baseline  walking ~ 10 min with pain ~ 6/10    Time  10    Period  Weeks    Status  On-going      PT LONG TERM GOAL #3   Title  Pt. Will be able to cath without pain and will be cleared by physiscian to  d/c self-cath due to decreased residual volume with bladder emptying.     Baseline  required to cath 3 times per day, painful each time.    Time  10    Period  Weeks    Status  Achieved      PT LONG TERM GOAL #4   Title  Patient will score less than or equal to 40% on the Female NIH-CPSI and 30% on the [facility] to demonstrate a reduction in pain, urinary symptoms, and an improved quality of life.    Baseline  Female NIH-CPSI: 38/43 (88%) , PDI: 45/75 (60%); PDI 44/75 (59%) and Female NIH-CPSI: 27/43 (64%) on 02-08-19    Time  10    Period  Weeks    Status  Partially Met      PT LONG TERM GOAL #5   Title  Patient will demonstrate increased step length with reciprical arm swing, and demonstrate no scissoring steps.    Baseline  Pt currently demonstrated scissoring steppage on R LE, increased trunk rotation and deep core engagement with reciprical walking poles.    Time  10    Period  Weeks    Status  Achieved      Additional Long Term Goals   Additional Long Term Goals  Yes      PT LONG TERM GOAL #6   Title  Pt will report no radiating pain down LLE after walking treadmill for 10 min in order to progress with aerobic exercises    Time  8    Period  Weeks    Status  New    Target Date  05/08/19            Plan - 03/20/19 1149    Clinical Impression Statement  Pt has achieved 2/3 STG, 2/6 LTG and is progressing well with remaining goals. Pt's improvements across the past 10 visits include: _ urination without delay during midday   _scoliosis-related improvements:    _Less R posterior trunk rotation with less tightness of R    paraspinals    _more upright posture with less forward head posture/        thoracic kyphosis with increased posterior mm strength     and deep core strength to maintain upright posture    _increased pelvic girdle and hip mobility    _pelvic floor coordination performed with proper       technique and no more abdominal mm strain  _L radiating pain improvements: _waking up for the first time one one morning less          radiating pain 5-6/10 from 7-8/10  _walking on treadmill at 7 min with less tightness at L     lateral thigh _Pain has centralized from heel to above knee  With the improvements above, regional interdependent approach can be applied to further assess and treat lower kinetic chain to make further progress on decreasing B ( L > R)  radiating pain given pt's Hx of plantar fasciitis, hallus valgus, and vascular laser surgery. Past 2 sessions addressed tight IT band mm on L with manual Tx which helped to decrease intensity of L radiating pain to knee. Today, applied further manual Tx on L IT band and leg. Manual Tx was applied independent of other interventions. Pt will need further Tx on L leg as pt reported cramping there after Tx during rolling to sitting and planning to address hallus valgus and weak DF/EV strength on L. Anticipate these areas will help minimize return  of IT band tightness on L. Due to pt's gait deviations ( decreased R stance phase, slight R trunk lean),   R shoe lift was provided and  pt reported feeling better with L radiating pain. This leg height correction will also help minimize IT band tightness. Addressing lower kinetic chain will facilitate less posterior tilt of pelvis for better pelvic function.  Plan to also address fitness exercises that pt performs with trainer and modify to ensure more well-rounded routine to minimize overuse of certain mm groups and ensure customization in order to minimize worsening of pt's scoliosis, to correct posterior tilt of pelvis, and lower kinetic chain deficits.   Pt continues to benefit from skilled PT to achieve remaining goals.       _   Personal Factors and Comorbidities  Age;Comorbidity 3+    Comorbidities  Osteoporosis, scoliosis, Arthritis, COPD, recent lumbosacral fusion for spondylolisthesis.    Examination-Activity Limitations  Toileting;Sit;Stand;Bend;Lift;Carry    Examination-Participation Restrictions  Interpersonal  Relationship;Yard Work;Cleaning;Laundry    Stability/Clinical Decision Making  Unstable/Unpredictable    Rehab Potential  Good    PT Frequency  2x / week    PT Duration  Other (comment)    PT Treatment/Interventions  ADLs/Self Care Home Management;Aquatic Therapy;Moist Heat;Electrical Stimulation;Traction;Therapeutic activities;Functional mobility training;Stair training;Gait training;Therapeutic exercise;Balance training;Neuromuscular re-education;Patient/family education;Manual techniques;Dry needling;Passive range of motion;Scar mobilization;Taping    PT Next Visit Plan  Review HEP with demonstration for A and B days; TPDN to L gastroc/soleus, adductors and illipsoas. Gait training with walking sticks going backwards; ** 10-22 improving hip ER (R>L)    PT Home Exercise Plan  Deep core level 1; diaphragmatic breathing, hip-flexor stretch, side-stretch, pelvic tilts in seated, TA in mod-quad, diaphragmatic breathing, supine butterfly, bow-and-arrow, posterior fourchette massage, urge-suppression, squatty potty, doorway chest-stretch, arm ABD slides, biased lumbar twist stretch. Plan adjusted for 2 split days. Added green tband backwards walking exercises, seated hasmtring stretch    Consulted and Agree with Plan of Care  Patient       Patient will benefit from skilled therapeutic intervention in order to improve the following deficits and impairments:  Abnormal gait, Decreased balance, Increased muscle spasms, Decreased range of motion, Decreased scar mobility, Improper body mechanics, Decreased coordination, Decreased strength, Increased fascial restricitons, Impaired flexibility, Postural dysfunction, Pain  Visit Diagnosis: Abnormal posture  Other muscle spasm     Problem List Patient Active Problem List   Diagnosis Date Noted  . Chronic UTI 12/06/2018  . Status post lumbar surgery 12/06/2018  . Spondylolisthesis, lumbar region 09/26/2018  . Aortic atherosclerosis ([place]) 08/31/2018  .  CAD (coronary artery disease) 02/23/2018  . Fatigue 01/20/2018  . Short of breath on exertion 10/17/2017  . Advanced care planning/counseling discussion 03/28/2017  . Varicose veins of both lower extremities with complications 88/50/2774  . Arthritis 12/09/2016  . Vaginal atrophy 09/10/2015  . Menopausal hot flushes 09/10/2015  . Menopause 09/10/2015  . Onychomycosis due to dermatophyte 04/07/2015  . Bursitis of right shoulder 02/26/2015  . Environmental and seasonal allergies 02/26/2015  . Other allergic rhinitis 02/26/2015  . Osteoporosis 11/26/2014  . COPD (chronic obstructive pulmonary disease) ([place]) 11/26/2014  . Fuchs' corneal dystrophy 11/26/2014  . Essential (primary) hypertension 11/26/2014  . Hyperlipidemia 11/26/2014  . Chronic kidney disease, stage III (moderate) ([place]) 11/26/2014  . Hypothyroidism 11/26/2014  . Low back pain 11/26/2014  . Endothelial corneal dystrophy 11/26/2014    Jerl Mina ,PT, DPT, E-RYT  03/20/2019, 12:01 PM  [place] MAIN [facility] SERVICES  [place], [place], [address] Phone: [phone]   Fax:  [phone]  Name: Julia Deleon MRN: [id number] Date of Birth: [birth year]

## 2019-03-22 DIAGNOSIS — J301 Allergic rhinitis due to pollen: Secondary | ICD-10-CM | POA: Diagnosis not present

## 2019-03-23 ENCOUNTER — Ambulatory Visit: Payer: Medicare Other | Admitting: Physical Therapy

## 2019-03-23 ENCOUNTER — Other Ambulatory Visit: Payer: Self-pay

## 2019-03-23 ENCOUNTER — Encounter: Payer: Medicare Other | Admitting: Physical Therapy

## 2019-03-23 DIAGNOSIS — M62838 Other muscle spasm: Secondary | ICD-10-CM

## 2019-03-23 DIAGNOSIS — R2689 Other abnormalities of gait and mobility: Secondary | ICD-10-CM | POA: Diagnosis not present

## 2019-03-23 DIAGNOSIS — M2170 Unequal limb length (acquired), unspecified site: Secondary | ICD-10-CM | POA: Diagnosis not present

## 2019-03-23 DIAGNOSIS — R293 Abnormal posture: Secondary | ICD-10-CM

## 2019-03-23 NOTE — Patient Instructions (Addendum)
Seated heel raises, one at a time, barefoot for 3 mins  Spread the ballmounds, and toes       " Pulling a lawnmover"  PUT R FOOT STRAIGHT FORWARD Band is under feet, hold band in L hand, across body by R pocket  Press downward into the ballmounds and heels of the feet, thigh muscle active, Keep knees and hips squared the front and do not move them throughout the activity,  LEAN FORWARD Exhale to pull band from R pocket,  across the face to the R pocket, rotating only your ribcage to L as if "drawing a sword"   DONT TURN HIP 10 reps x 2 x day

## 2019-03-23 NOTE — Therapy (Signed)
[place] MAIN [facility] SERVICES [address] [place], [place], [address] Phone: [phone]   Fax:  [phone]  Physical Therapy Treatment   Patient Details  Name: Julia Deleon MRN: [id number] Date of Birth: 09-25-1945 Referring Provider (PT): Dr. Maryan Puls   Encounter Date: 03/23/2019  PT End of Session - 03/23/19 1106    Visit Number  35    Date for PT Re-Evaluation  05/27/18    Authorization Type  Medicare    Authorization Time Period  through 04/19/19    Authorization - Visit Number  11   last progress note submitted 03/19/19   Authorization - Number of Visits  20    PT Start Time  0900    PT Stop Time  1000    PT Time Calculation (min)  60 min    Activity Tolerance  Patient tolerated treatment well;No increased pain    Behavior During Therapy  WFL for tasks assessed/performed       Past Medical History:  Diagnosis Date  . Arthritis   . COPD (chronic obstructive pulmonary disease) ([place])   . Family history of adverse reaction to anesthesia    father was slow to wake up  . Fuch's endothelial dystrophy   . Hyperlipidemia   . Hypothyroidism   . PONV (postoperative nausea and vomiting)    slow to wake up and PONV    Past Surgical History:  Procedure Laterality Date  . APPENDECTOMY    . BACK SURGERY  09/26/2018   L4-5 PLIF by Dr. Arnoldo Morale  . CARDIAC CATHETERIZATION    . CHOLECYSTECTOMY    . EYE SURGERY    . FOOT SURGERY Right    pin removed left  . laser vein surgery    . NASAL SINUS SURGERY    . RIGHT/LEFT HEART CATH AND CORONARY ANGIOGRAPHY N/A 12/06/2017   Procedure: RIGHT/LEFT HEART CATH AND CORONARY ANGIOGRAPHY;  Surgeon: Yolonda Kida, MD;  Location: [facility];  Service: Cardiovascular;  Laterality: N/A;  . TONSILLECTOMY      There were no vitals filed for this visit.  Subjective Assessment - 03/23/19 0904    Subjective  After last session, pt has had a week full of cramps. Pt has been wearing her  shoe lift and thinks it should come out. The cramping was on and off all day when she drove out of town.         [facility] PT Assessment - 03/23/19 0907      Observation/Other Assessments   Observations  no more thoracic kyphosis, less posterior tilt/ lumbar lordosis       Palpation   Palpation comment  tightness at B quad, distal attachment of R ITband, L lateral leg   Prone: R SIJ more posterior        Ambulation/Gait   Gait velocity  1.1 m/s ( post Tx with mroe L anterior rotation of pelvis)     Gait Comments  less R trunk lean, R pelvic rotation more anterior than L ,                      [facility] Adult PT Treatment/Exercise - 03/23/19 0907      Neuro Re-ed    Neuro Re-ed Details   cued for dissassociation of trunk/ BLE in PNF band exercise, tried reverse warrior twist by wall but pt expressed difficulty remembering it so deferred to PNF exericise. Cued for seated heel raises to strengtehning transverse  arches      Modalities   Modalities  --   5 over L quad/ leg     Manual Therapy   Manual therapy comments  STM/traction/ sustained stretch (IR)  along B quad, lateral tib/fib hypomobile, distal attachment of ITBAND R, L calf / externsor diggitorium/ peroneal longus/ brevis L    Prone: Pa mob Grade III on R SIJ                PT Short Term Goals - 03/23/19 1221      PT SHORT TERM GOAL #1   Title  Patient will demonstrate improved pelvic alignment and balance of musculature surrounding the pelvis to facilitate decreased PFM spasms and decrease pelvic pain.    Baseline  Pt. demonstrates severe posterior pelvic tilt, L thoracic/R lumbar scoliosis, and R anterior/L posterior pelvic obliquity. Pt. has demonstrated improved pelvic alignment and posture but has not been able to sustain long-term due to continued lack of postural strength.    Time  5    Period  Weeks    Status  Achieved    Target Date  03/06/19      PT SHORT TERM GOAL #2   Title  Patient will  demonstrate a coordinated contraction, relaxation, and bulge of the pelvic floor muscles to demonstrate functional recruitment and motion and allow for bladder emptying.    Baseline  Pt. is unable to fully empty bladder and describes pain with catheterization, demonstrating high tone of PFM.    Time  5    Period  Weeks    Status  Achieved    Target Date  03/06/19      PT SHORT TERM GOAL #3   Title  Patient will demonstrate improved sitting and standing posture to demonstrate learning and decrease stress on the pelvic floor with functional activity.    Baseline  Pt. demonstrates severe posterior pelvic tilt, L thoracic/R lumbar scoliosis, and R anterior/L posterior pelvic obliquity. Pt is able to attain improved posture in upper spine, less forward head, less thoracic kyphosis but posterior tilt is still present    Time  5    Period  Weeks    Status  Achieved    Target Date  05/15/19        PT Long Term Goals - 03/23/19 1012      PT LONG TERM GOAL #1   Title  Patient will describe pain no greater than 3/10 during Sitting for 2 hours to demonstrate improved functional ability.    Baseline  Max 9/10, worst with prolonged sitting. (Pt. ignoring pain at initial eval)    Time  10    Period  Weeks    Status  On-going      PT LONG TERM GOAL #2   Title  Pt. will report walking for 30 min. without an increase in pain >2/10 to allow for return to exercise and demonstrate improved function.    Baseline  walking ~ 10 min with pain ~ 6/10    Time  10    Period  Weeks    Status  Partially Met      PT LONG TERM GOAL #3   Title  Pt. Will be able to cath without pain and will be cleared by physiscian to d/c self-cath due to decreased residual volume with bladder emptying.    Baseline  required to cath 3 times per day, painful each time.    Time  10    Period  Weeks  Status  Achieved      PT LONG TERM GOAL #4   Title  Patient will score less than or equal to 40% on the Female NIH-CPSI and  30% on the [facility] to demonstrate a reduction in pain, urinary symptoms, and an improved quality of life.    Baseline  Female NIH-CPSI: 38/43 (88%) , PDI: 45/75 (60%); PDI 44/75 (59%) and Female NIH-CPSI: 27/43 (64%) on 02-08-19    Time  10    Period  Weeks    Status  Partially Met      PT LONG TERM GOAL #5   Title  Patient will demonstrate increased step length with reciprical arm swing, and demonstrate no scissoring steps.    Baseline  Pt currently demonstrated scissoring steppage on R LE, increased trunk rotation and deep core engagement with reciprical walking poles.    Time  10    Period  Weeks    Status  Achieved      PT LONG TERM GOAL #6   Title  Pt will report no radiating pain down LLE after walking treadmill for 10 min in order to progress with aerobic exercises    Time  8    Period  Weeks    Status  New      PT LONG TERM GOAL #7   Title  Pt will report walking up > 2 mornings in a row with pain level < 5-6/10 in order to demo maintanence of Tx benefits and more balance of mm tensions achieved with HEP, shoe lift to improve QOL    Baseline  7-8/10 level and benefits from Tx lasted only one morning.    Time  10    Period  Weeks    Status  Partially Met            Plan - 03/23/19 1126    Clinical Impression Statement  Pt has achieved all her STG today. Pt demo'd more neutral position of pelvis.  The past weeks' treatments focused on decreasing leg tightness.  Prior sessions's focused on helping pt to gain deep core strength and stronger R trunk muscles with less R trunk lean, and decreasing thoracic kyphosis.  Pt stands with more upright posture and anticipate with more lower kinetic chain treatments and proper strengthening, pt will be able to maintain more neutral alignment of pelvis which will also help with radiating pain and L lateral thigh tightness complaints. Shoe lift has been removed from [place] as it caused more cramping. Focused on correcting rotational component of  thoracolumbar scoliosis today with manual Tx on L SIJ, B qudas and legs. Pt demo'd increased anterior rotation of L hip in gait post Tx. Provided neuromuscular reeducation on disassociating pelvis from BLE and strengthening/ lengthening R flank.   Plan to continue with manual Tx to facilitate reciporcal movement of pelvis and trunk to minimize L ITband / BLE, R SIJ hypomobility.  Pt continues to benefit from skilled PT.      Personal Factors and Comorbidities  Age;Comorbidity 3+    Comorbidities  Osteoporosis, scoliosis, Arthritis, COPD, recent lumbosacral fusion for spondylolisthesis.    Examination-Activity Limitations  Toileting;Sit;Stand;Bend;Lift;Carry    Examination-Participation Restrictions  Interpersonal Relationship;Yard Work;Cleaning;Laundry    Stability/Clinical Decision Making  Unstable/Unpredictable    Rehab Potential  Good    PT Frequency  2x / week    PT Duration  Other (comment)    PT Treatment/Interventions  ADLs/Self Care Home Management;Aquatic Therapy;Moist Heat;Electrical Stimulation;Traction;Therapeutic activities;Functional mobility training;Stair training;Gait training;Therapeutic exercise;Balance training;Neuromuscular  re-education;Patient/family education;Manual techniques;Dry needling;Passive range of motion;Scar mobilization;Taping    PT Next Visit Plan  Review HEP with demonstration for A and B days; TPDN to L gastroc/soleus, adductors and illipsoas. Gait training with walking sticks going backwards; ** 10-22 improving hip ER (R>L)    PT Home Exercise Plan  Deep core level 1; diaphragmatic breathing, hip-flexor stretch, side-stretch, pelvic tilts in seated, TA in mod-quad, diaphragmatic breathing, supine butterfly, bow-and-arrow, posterior fourchette massage, urge-suppression, squatty potty, doorway chest-stretch, arm ABD slides, biased lumbar twist stretch. Plan adjusted for 2 split days. Added green tband backwards walking exercises, seated hasmtring stretch     Consulted and Agree with Plan of Care  Patient       Patient will benefit from skilled therapeutic intervention in order to improve the following deficits and impairments:  Abnormal gait, Decreased balance, Increased muscle spasms, Decreased range of motion, Decreased scar mobility, Improper body mechanics, Decreased coordination, Decreased strength, Increased fascial restricitons, Impaired flexibility, Postural dysfunction, Pain  Visit Diagnosis: Abnormal posture  Other muscle spasm     Problem List Patient Active Problem List   Diagnosis Date Noted  . Chronic UTI 12/06/2018  . Status post lumbar surgery 12/06/2018  . Spondylolisthesis, lumbar region 09/26/2018  . Aortic atherosclerosis ([place]) 08/31/2018  . CAD (coronary artery disease) 02/23/2018  . Fatigue 01/20/2018  . Short of breath on exertion 10/17/2017  . Advanced care planning/counseling discussion 03/28/2017  . Varicose veins of both lower extremities with complications 83/35/8251  . Arthritis 12/09/2016  . Vaginal atrophy 09/10/2015  . Menopausal hot flushes 09/10/2015  . Menopause 09/10/2015  . Onychomycosis due to dermatophyte 04/07/2015  . Bursitis of right shoulder 02/26/2015  . Environmental and seasonal allergies 02/26/2015  . Other allergic rhinitis 02/26/2015  . Osteoporosis 11/26/2014  . COPD (chronic obstructive pulmonary disease) ([place]) 11/26/2014  . Fuchs' corneal dystrophy 11/26/2014  . Essential (primary) hypertension 11/26/2014  . Hyperlipidemia 11/26/2014  . Chronic kidney disease, stage III (moderate) ([place]) 11/26/2014  . Hypothyroidism 11/26/2014  . Low back pain 11/26/2014  . Endothelial corneal dystrophy 11/26/2014    Jerl Mina ,PT, DPT, E-RYT  03/23/2019, 12:25 PM  [place] MAIN [facility] SERVICES [address] [place], [place], [address] Phone: [phone]   Fax:  [phone]  Name: Lashane C Longoria MRN: [id number] Date of Birth: 13-Dec-1945

## 2019-03-26 ENCOUNTER — Ambulatory Visit: Payer: Medicare Other | Admitting: Physical Therapy

## 2019-03-26 ENCOUNTER — Other Ambulatory Visit: Payer: Self-pay

## 2019-03-26 DIAGNOSIS — R293 Abnormal posture: Secondary | ICD-10-CM | POA: Diagnosis not present

## 2019-03-26 DIAGNOSIS — M62838 Other muscle spasm: Secondary | ICD-10-CM

## 2019-03-26 DIAGNOSIS — M2170 Unequal limb length (acquired), unspecified site: Secondary | ICD-10-CM | POA: Diagnosis not present

## 2019-03-26 DIAGNOSIS — R2689 Other abnormalities of gait and mobility: Secondary | ICD-10-CM | POA: Diagnosis not present

## 2019-03-26 NOTE — Patient Instructions (Addendum)
Discontinue the [employer]  Instead  Stand with your back turned to the door, in ski tracks, R foot back  Pull band over R shoulder Push into legs strongly, front knee slightly pointing L more across 2-3 rd toes , push into the L [place] of foot   10 reps   ____  Discontinue seated heel raises  Progress to standing   Single heel raises  10 reps  Hand on the wall    _________  Wear pedicure spacer 30 min to spread the toes without socks and shoes, reading resting seated heel raises 2 min with spacers

## 2019-03-27 NOTE — Therapy (Signed)
[place] MAIN [facility] SERVICES [address] [place], [place], [address] Phone: [phone]   Fax:  [phone]  Physical Therapy Treatment / Progress Note                                                                                                                              Reporting between 03/19/19 to 03/26/19  Patient Details  Name: Julia Deleon MRN: [id number] Date of Birth: 11/11/1945 Referring Provider (PT): Dr. Maryan Puls   Encounter Date: 03/26/2019  PT End of Session - 03/27/19 0834    Visit Number  4    Date for PT Re-Evaluation  06/05/2019     Authorization Type  Medicare    Authorization Time Period   06/05/2019    Authorization - Visit Number  1 last progress note submitted 03/26/19   Authorization - Number of Visits  20    PT Start Time  1000    PT Stop Time  1100    PT Time Calculation (min)  60 min    Activity Tolerance  Patient tolerated treatment well;No increased pain    Behavior During Therapy  WFL for tasks assessed/performed       Past Medical History:  Diagnosis Date  . Arthritis   . COPD (chronic obstructive pulmonary disease) ([place])   . Family history of adverse reaction to anesthesia    father was slow to wake up  . Fuch's endothelial dystrophy   . Hyperlipidemia   . Hypothyroidism   . PONV (postoperative nausea and vomiting)    slow to wake up and PONV    Past Surgical History:  Procedure Laterality Date  . APPENDECTOMY    . BACK SURGERY  09/26/2018   L4-5 PLIF by Dr. Arnoldo Morale  . CARDIAC CATHETERIZATION    . CHOLECYSTECTOMY    . EYE SURGERY    . FOOT SURGERY Right    pin removed left  . laser vein surgery    . NASAL SINUS SURGERY    . RIGHT/LEFT HEART CATH AND CORONARY ANGIOGRAPHY N/A 12/06/2017   Procedure: RIGHT/LEFT HEART CATH AND CORONARY ANGIOGRAPHY;  Surgeon: Yolonda Kida, MD;  Location: [place] [facility];  Service: Cardiovascular;  Laterality: N/A;  . TONSILLECTOMY       There were no vitals filed for this visit.  Subjective Assessment - 03/26/19 1020    Subjective  Pt felt exhausted after last session. Pt felt better upon waking the morning after with 6/10. And the following morning, pain was back at 7/10. This is the second time pt woke up feeling better after the sessions. Pt reports the tighness in ehr L thigh occuring with the wake from the parking lot to the hospital         [facility] PT Assessment - 03/27/19 0841      Strength   Overall Strength Comments  single UE support, PF B, L 7 reps  2/5, 10  reps R 3/5      Palpation   Palpation comment   adductor hallucis , tightness and tenderness between digit II-III, plantarfascia tight , midfoot limited in DF/EV    no more tightness along leg and IT band                  [facility] Adult PT Treatment/Exercise - 03/27/19 6203      Neuro Re-ed    Neuro Re-ed Details   cued for widening transverse arch B in seated exercise and SLS heel raises with UE support       Manual Therapy   Manual therapy comments  STM/MWM at adductor hallucis , tightness and tenderness between digit II-III, plantarfascia tight , midfoot limited in DF/EV                PT Short Term Goals - 03/23/19 1221      PT SHORT TERM GOAL #1   Title  Patient will demonstrate improved pelvic alignment and balance of musculature surrounding the pelvis to facilitate decreased PFM spasms and decrease pelvic pain.    Baseline  Pt. demonstrates severe posterior pelvic tilt, L thoracic/R lumbar scoliosis, and R anterior/L posterior pelvic obliquity. Pt. has demonstrated improved pelvic alignment and posture but has not been able to sustain long-term due to continued lack of postural strength.    Time  5    Period  Weeks    Status  Achieved    Target Date  03/06/19      PT SHORT TERM GOAL #2   Title  Patient will demonstrate a coordinated contraction, relaxation, and bulge of the pelvic floor muscles to demonstrate  functional recruitment and motion and allow for bladder emptying.    Baseline  Pt. is unable to fully empty bladder and describes pain with catheterization, demonstrating high tone of PFM.    Time  5    Period  Weeks    Status  Achieved    Target Date  03/06/19      PT SHORT TERM GOAL #3   Title  Patient will demonstrate improved sitting and standing posture to demonstrate learning and decrease stress on the pelvic floor with functional activity.    Baseline  Pt. demonstrates severe posterior pelvic tilt, L thoracic/R lumbar scoliosis, and R anterior/L posterior pelvic obliquity. Pt is able to attain improved posture in upper spine, less forward head, less thoracic kyphosis but posterior tilt is still present    Time  5    Period  Weeks    Status  Achieved    Target Date  05/15/19        PT Long Term Goals - 03/26/19 1023      PT LONG TERM GOAL #1   Title  Patient will be able sit for 1 hour and apply loosening HEP and minimize pain that occurs by 50% in order to continue with teaching on a zoom call  or driving long distance to visit sister    Baseline  sit for 1 hour and pain occurs at level 6/10    Time  10    Period  Weeks    Status  Revised      PT LONG TERM GOAL #2   Title  Pt will report walking from parking lot to Rehab waiting room with decreased tightness of L thigh  by 50% in order to walk in the community    Baseline  Pt will report walking from parking lot to Rehab waiting  room with  tightness of L thigh    Time  10    Period  Weeks    Status  Revised      PT LONG TERM GOAL #3   Title  Pt. Will be able to cath without pain and will be cleared by physiscian to d/c self-cath due to decreased residual volume with bladder emptying.    Baseline  required to cath 3 times per day, painful each time.    Time  10    Period  Weeks    Status  Achieved      PT LONG TERM GOAL #4   Title  Patient will score less than or equal to 40% on the Female NIH-CPSI and 30% on the [facility]  to demonstrate a reduction in pain, urinary symptoms, and an improved quality of life.    Baseline  Female NIH-CPSI: 38/43 (88%) , PDI: 45/75 (60%); PDI 44/75 (59%) and Female NIH-CPSI: 27/43 (64%) on 02-08-19 , Female NIH-CPSI: 17% on 03-26-19    Time  10    Period  Weeks    Status  Achieved      PT LONG TERM GOAL #5   Title  Patient will demonstrate increased step length with reciprical arm swing, and demonstrate no scissoring steps.    Baseline  Pt currently demonstrated scissoring steppage on R LE, increased trunk rotation and deep core engagement with reciprical walking poles.    Time  10    Period  Weeks    Status  Achieved      PT LONG TERM GOAL #6   Title  Pt will report no radiating pain down LLE after walking treadmill for 10 min in order to progress with aerobic exercises    Baseline  after 7 min    Time  8    Period  Weeks    Status  On-going    Target Date  06/04/19      PT LONG TERM GOAL #7   Title  Pt will report walking up > 2 mornings in a row with pain level < 5-6/10 in order to demo maintanence of Tx benefits and more balance of mm tensions achieved with HEP, shoe lift to improve QOL    Baseline  7-8/10 level and benefits from Tx lasted only one morning  (03-19-19 and 03-23-19)    Time  10    Period  Weeks    Status  Partially Met      PT LONG TERM GOAL #8   Title  Pt will be able to demo increased PF strength with unilateral UE support on wall from B 3/5 15 reps in order to achieve stronger push off in gait and walk longer distance    Baseline  B, L 7 reps  2/5, 10 reps R 3/5    Time  10    Period  Weeks    Status  New    Target Date  06/04/19            Plan - 03/26/19 1025    Clinical Impression Statement Pt achieved all STG. Pt has achieved 3/8 LTGs. 2 goals were revised to make them more functional, and 1 goal was added to address ankle/ feet weakness. Anticipate regional interdependent approach will continue to yield long term outcomes. Pt's urinary  issues have improved ( able to urinate midday) and currently working on her orthopedic issues which in turn, will further improve her urinary issues.   Pt is making a  milestone improvement with report of waking up with decreased L thigh pain out 2 mornings out of 7 days. These mornings occurred after each PT session which included manual Tx to release IT band and leg tightness. Today , pt showed no more tightness in these areas. Focused on pt's ankle/ feet which has had Hx of R foot surgery and L foot with digit II overlapping with digit I. Adductor hallucis mm ( transverse head) was tight and tender  And DF/EV was limited due to midfoot hypomobility. Manual Tx improved these areas. Pt showed weakness of plantarflexion strength and strengthening exercise was added to her HEP.   Pt  continues to show significant improvements with customized scoliosis HEP, deep core core strengthening, pt's upper body is better aligned with less R trunk lean, no more thoracic kyphosis, increased pelvic neutral position.  Anticipate focusing on lower kinetic chain will help pt have decreased pain when walking and achieve remaining goals. Plan to assess treadmill walking and modify fitness exercises she performs with fitness trainer at upcoming session.  Pt continues to benefit from skilled PT.        Personal Factors and Comorbidities  Age;Comorbidity 3+    Comorbidities  Osteoporosis, scoliosis, Arthritis, COPD, recent lumbosacral fusion for spondylolisthesis.    Examination-Activity Limitations  Toileting;Sit;Stand;Bend;Lift;Carry    Examination-Participation Restrictions  Interpersonal Relationship;Yard Work;Cleaning;Laundry    Stability/Clinical Decision Making  Unstable/Unpredictable    Rehab Potential  Good    PT Frequency  2x / week    PT Duration  Other (comment)    PT Treatment/Interventions  ADLs/Self Care Home Management;Aquatic Therapy;Moist Heat;Electrical Stimulation;Traction;Therapeutic  activities;Functional mobility training;Stair training;Gait training;Therapeutic exercise;Balance training;Neuromuscular re-education;Patient/family education;Manual techniques;Dry needling;Passive range of motion;Scar mobilization;Taping    PT Next Visit Plan  Review HEP with demonstration for A and B days; TPDN to L gastroc/soleus, adductors and illipsoas. Gait training with walking sticks going backwards; ** 10-22 improving hip ER (R>L)    PT Home Exercise Plan    Consulted and Agree with Plan of Care  Patient       Patient will benefit from skilled therapeutic intervention in order to improve the following deficits and impairments:  Abnormal gait, Decreased balance, Increased muscle spasms, Decreased range of motion, Decreased scar mobility, Improper body mechanics, Decreased coordination, Decreased strength, Increased fascial restricitons, Impaired flexibility, Postural dysfunction, Pain  Visit Diagnosis: Abnormal posture  Other muscle spasm     Problem List Patient Active Problem List   Diagnosis Date Noted  . Chronic UTI 12/06/2018  . Status post lumbar surgery 12/06/2018  . Spondylolisthesis, lumbar region 09/26/2018  . Aortic atherosclerosis ([place]) 08/31/2018  . CAD (coronary artery disease) 02/23/2018  . Fatigue 01/20/2018  . Short of breath on exertion 10/17/2017  . Advanced care planning/counseling discussion 03/28/2017  . Varicose veins of both lower extremities with complications 28/31/5176  . Arthritis 12/09/2016  . Vaginal atrophy 09/10/2015  . Menopausal hot flushes 09/10/2015  . Menopause 09/10/2015  . Onychomycosis due to dermatophyte 04/07/2015  . Bursitis of right shoulder 02/26/2015  . Environmental and seasonal allergies 02/26/2015  . Other allergic rhinitis 02/26/2015  . Osteoporosis 11/26/2014  . COPD (chronic obstructive pulmonary disease) ([place]) 11/26/2014  . Fuchs' corneal dystrophy 11/26/2014  . Essential (primary) hypertension 11/26/2014  .  Hyperlipidemia 11/26/2014  . Chronic kidney disease, stage III (moderate) ([place]) 11/26/2014  . Hypothyroidism 11/26/2014  . Low back pain 11/26/2014  . Endothelial corneal dystrophy 11/26/2014    Jerl Mina ,PT, DPT, E-RYT  03/27/2019, 9:01 AM  [place] MAIN [facility] SERVICES [address] [place], [place], [address] Phone: [phone]   Fax:  [phone]  Name: Julia Deleon MRN: [id number] Date of Birth: 09-Feb-1946

## 2019-03-28 ENCOUNTER — Other Ambulatory Visit: Payer: Self-pay

## 2019-03-28 ENCOUNTER — Ambulatory Visit: Payer: Medicare Other | Admitting: Physical Therapy

## 2019-03-28 DIAGNOSIS — R293 Abnormal posture: Secondary | ICD-10-CM

## 2019-03-28 DIAGNOSIS — M62838 Other muscle spasm: Secondary | ICD-10-CM

## 2019-03-28 DIAGNOSIS — M2170 Unequal limb length (acquired), unspecified site: Secondary | ICD-10-CM | POA: Diagnosis not present

## 2019-03-28 DIAGNOSIS — R2689 Other abnormalities of gait and mobility: Secondary | ICD-10-CM | POA: Diagnosis not present

## 2019-03-28 NOTE — Therapy (Addendum)
[place] MAIN [facility] SERVICES [address] [place], [place], [address] Phone: [phone]   Fax:  [phone]  Physical Therapy Treatment  Patient Details  Name: Julia Deleon MRN: [id number] Date of Birth: May 15, 1945 Referring Provider (PT): Dr. Maryan Puls   Encounter Date: 03/28/2019  PT End of Session - 03/28/19 1139    Visit Number  37    Number of Visits  34    Date for PT Re-Evaluation  05/15/19    Authorization Type  Medicare    Authorization Time Period  05/15/2019    Authorization - Visit Number  1   last progress note submitted 03/19/19   Authorization - Number of Visits  20    PT Start Time  1107    PT Stop Time  1200    PT Time Calculation (min)  53 min    Activity Tolerance  Patient tolerated treatment well;No increased pain    Behavior During Therapy  WFL for tasks assessed/performed       Past Medical History:  Diagnosis Date  . Arthritis   . COPD (chronic obstructive pulmonary disease) ([place])   . Family history of adverse reaction to anesthesia    father was slow to wake up  . Fuch's endothelial dystrophy   . Hyperlipidemia   . Hypothyroidism   . PONV (postoperative nausea and vomiting)    slow to wake up and PONV    Past Surgical History:  Procedure Laterality Date  . APPENDECTOMY    . BACK SURGERY  09/26/2018   L4-5 PLIF by Dr. Arnoldo Morale  . CARDIAC CATHETERIZATION    . CHOLECYSTECTOMY    . EYE SURGERY    . FOOT SURGERY Right    pin removed left  . laser vein surgery    . NASAL SINUS SURGERY    . RIGHT/LEFT HEART CATH AND CORONARY ANGIOGRAPHY N/A 12/06/2017   Procedure: RIGHT/LEFT HEART CATH AND CORONARY ANGIOGRAPHY;  Surgeon: Yolonda Kida, MD;  Location: [facility] [place] [facility];  Service: Cardiovascular;  Laterality: N/A;  . TONSILLECTOMY      There were no vitals filed for this visit.  Subjective Assessment - 03/28/19 1111    Subjective  Pt reported she woke up again feeling less tightness in L  thigh in the morning after last session. It did come back the next morning         [facility] PT Assessment - 03/28/19 1116      Observation/Other Assessments   Observations  stance: L knee with slight flexion       Strength   Overall Strength Comments  DF/EV OKC R 4/5, L 3/5        Palpation   SI assessment   R iliac crest higher standing, less posterior thoracic rotation     Palpation comment  adductor hallucis less tight .  L medial knee with tightness/ tenderness and slight flexion,. (post Tx: increased knee ext)        Bed Mobility   Rolling Right  --   no co/o leg cramping nor radiating pain to thigh      Ambulation/Gait   Gait Comments  L medial whip at ankle pre swing  ( pre Tx, ) no medial whip ( post Tx)                     [facility] Adult PT Treatment/Exercise - 03/28/19 1116      Neuro Re-ed    Neuro Re-ed  Details   excessive cues for feet/ knee propioception and metatarsal of 1st toe on L to activate .  Cued for increased knee / hip flexion in gait. Applied resistance at waist to co-activate deep core and lower kinetic chain.       Manual Therapy   Manual therapy comments  medial glide at tibial plateau to promote knee ext                 PT Short Term Goals - 03/23/19 1221      PT SHORT TERM GOAL #1   Title  Patient will demonstrate improved pelvic alignment and balance of musculature surrounding the pelvis to facilitate decreased PFM spasms and decrease pelvic pain.    Baseline  Pt. demonstrates severe posterior pelvic tilt, L thoracic/R lumbar scoliosis, and R anterior/L posterior pelvic obliquity. Pt. has demonstrated improved pelvic alignment and posture but has not been able to sustain long-term due to continued lack of postural strength.    Time  5    Period  Weeks    Status  Achieved    Target Date  03/06/19      PT SHORT TERM GOAL #2   Title  Patient will demonstrate a coordinated contraction, relaxation, and bulge of the pelvic floor  muscles to demonstrate functional recruitment and motion and allow for bladder emptying.    Baseline  Pt. is unable to fully empty bladder and describes pain with catheterization, demonstrating high tone of PFM.    Time  5    Period  Weeks    Status  Achieved    Target Date  03/06/19      PT SHORT TERM GOAL #3   Title  Patient will demonstrate improved sitting and standing posture to demonstrate learning and decrease stress on the pelvic floor with functional activity.    Baseline  Pt. demonstrates severe posterior pelvic tilt, L thoracic/R lumbar scoliosis, and R anterior/L posterior pelvic obliquity. 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 Will be able to cath without pain and will be cleared by physiscian to d/c self-cath due to decreased residual volume with bladder emptying.  Baseline  required to cath 3 times per day, painful each time.    Time  10    Period  Weeks    Status  Achieved      PT LONG TERM GOAL #4   Title  Patient will score less than or equal to 40% on the Female  NIH-CPSI and 30% on the [facility] to demonstrate a reduction in pain, urinary symptoms, and an improved quality of life.    Baseline  Female NIH-CPSI: 38/43 (88%) , PDI: 45/75 (60%); PDI 44/75 (59%) and Female NIH-CPSI: 27/43 (64%) on 02-08-19 , Female NIH-CPSI: 17% on 03-26-19    Time  10    Period  Weeks    Status  Achieved      PT LONG TERM GOAL #5   Title  Patient will demonstrate increased step length with reciprical arm swing, and demonstrate no scissoring steps.    Baseline  Pt currently demonstrated scissoring steppage on R LE, increased trunk rotation and deep core engagement with reciprical walking poles.    Time  10    Period  Weeks    Status  Achieved      PT LONG TERM GOAL #6   Title  Pt will report no radiating pain down LLE after walking treadmill for 10 min in order to progress with aerobic exercises    Baseline  after 7 min    Time  8    Period  Weeks    Status  On-going    Target Date  06/04/19      PT LONG TERM GOAL #7   Title  Pt will report walking up > 2 mornings in a row with pain level < 5-6/10 in order to demo maintanence of Tx benefits and more balance of mm tensions achieved with HEP, shoe lift to improve QOL    Baseline  7-8/10 level and benefits from Tx lasted only one morning  (03-19-19 and 03-23-19)    Time  10    Period  Weeks    Status  Partially Met      PT LONG TERM GOAL #8   Title  Pt will be able to demo increased PF strength with unilateral UE support on wall from B 3/5 15 reps in order to achieve stronger push off in gait and walk longer distance    Baseline  B, L 7 reps  2/5, 10 reps R 3/5    Time  10    Period  Weeks    Status  New    Target Date  06/04/19            Plan - 03/28/19 1159    Clinical Impression Statement Regional interdepedent approaches continue to have positive effect on pt's L radiating pain as pt reports decreased pain upon waking in the morning following each of the past 3 sessionds.   Pt showed improvement with log  rolling without c/o cramping nor tightness L thigh during today's session and also showed good carry over from last session with signficantly decreased tightness on L IT band,leg, and intrinsic foot muscles.    Today, pt requred excessive feet and knee propioception and manual Tx to minimize L knee flexion in standing.  Suspect pt's knee flexion in stance on L LE is due to scoliosis and poor weightbearing on 1st metatarsal in gait. Post Tx, medial whip on L ankle was no longer present. L iliac crest levelled with less knee flexion in stance after manual Tx focused on  promoting more knee extension.  Pt reported L thigh pain decreased to 5/10 pain while walking out of office.   Plan to continue strengthening feet muscles as suspecting pt's avoided weight bearing across transverse arch 2/2 overlaping 2nd-3rd toes. Also apply training with co-activating deep core and lower kinetic chain in gait.   Pt continues to benefit from skilled PT      Personal Factors and Comorbidities  Age;Comorbidity 3+    Comorbidities  Osteoporosis, scoliosis, Arthritis, COPD, recent lumbosacral fusion for spondylolisthesis.    Examination-Activity Limitations  Toileting;Sit;Stand;Bend;Lift;Carry    Examination-Participation Restrictions  Interpersonal Relationship;Yard Work;Cleaning;Laundry    Stability/Clinical Decision Making  Unstable/Unpredictable    Rehab Potential  Good    PT Frequency  2x / week    PT Duration  Other (comment)   20   PT Treatment/Interventions  ADLs/Self Care Home Management;Aquatic Therapy;Moist Heat;Electrical Stimulation;Traction;Therapeutic activities;Functional mobility training;Stair training;Gait training;Therapeutic exercise;Balance training;Neuromuscular re-education;Patient/family education;Manual techniques;Dry needling;Passive range of motion;Scar mobilization;Taping    Consulted and Agree with Plan of Care  Patient       Patient will benefit from skilled therapeutic intervention in  order to improve the following deficits and impairments:  Abnormal gait, Decreased balance, Increased muscle spasms, Decreased range of motion, Decreased scar mobility, Improper body mechanics, Decreased coordination, Decreased strength, Increased fascial restricitons, Impaired flexibility, Postural dysfunction, Pain  Visit Diagnosis: Abnormal posture  Other muscle spasm     Problem List Patient Active Problem List   Diagnosis Date Noted  . Chronic UTI 12/06/2018  . Status post lumbar surgery 12/06/2018  . Spondylolisthesis, lumbar region 09/26/2018  . Aortic atherosclerosis ([place]) 08/31/2018  . CAD (coronary artery disease) 02/23/2018  . Fatigue 01/20/2018  . Short of breath on exertion 10/17/2017  . Advanced care planning/counseling discussion 03/28/2017  . Varicose veins of both lower extremities with complications 04/59/9774  . Arthritis 12/09/2016  . Vaginal atrophy 09/10/2015  . Menopausal hot flushes 09/10/2015  . Menopause 09/10/2015  . Onychomycosis due to dermatophyte 04/07/2015  . Bursitis of right shoulder 02/26/2015  . Environmental and seasonal allergies 02/26/2015  . Other allergic rhinitis 02/26/2015  . Osteoporosis 11/26/2014  . COPD (chronic obstructive pulmonary disease) ([place]) 11/26/2014  . Fuchs' corneal dystrophy 11/26/2014  . Essential (primary) hypertension 11/26/2014  . Hyperlipidemia 11/26/2014  . Chronic kidney disease, stage III (moderate) ([place]) 11/26/2014  . Hypothyroidism 11/26/2014  . Low back pain 11/26/2014  . Endothelial corneal dystrophy 11/26/2014    Jerl Mina ,PT, DPT, E-RYT  03/28/2019, 12:02 PM  [place] MAIN [facility] SERVICES [address] [place], [place], [address] Phone: [phone]   Fax:  [phone]  Name: Anjali C Nangle MRN: [id number] Date of Birth: 09-17-1945

## 2019-03-28 NOTE — Patient Instructions (Addendum)
Ankle strengthening on L with band band wrapped around outer L side of foot Heel on the ground, foot up slanted  ballmound of L foot pressing onto band , R foot is placed on top of band hip width apart, with the ballmound ,  R hand holds the band Do not move the knees 30 reps swinging L pinky toe out to the L  X 2x day   ___  Stop  Deep Core level 2  Progress to this  Inhale, exhale, toes up, ballmounds down, slide foot forward and back   6 min

## 2019-03-29 DIAGNOSIS — R5383 Other fatigue: Secondary | ICD-10-CM | POA: Diagnosis not present

## 2019-03-29 DIAGNOSIS — J439 Emphysema, unspecified: Secondary | ICD-10-CM | POA: Diagnosis not present

## 2019-03-29 DIAGNOSIS — I10 Essential (primary) hypertension: Secondary | ICD-10-CM | POA: Diagnosis not present

## 2019-03-29 DIAGNOSIS — R079 Chest pain, unspecified: Secondary | ICD-10-CM | POA: Diagnosis not present

## 2019-03-29 DIAGNOSIS — E782 Mixed hyperlipidemia: Secondary | ICD-10-CM | POA: Diagnosis not present

## 2019-03-29 DIAGNOSIS — I208 Other forms of angina pectoris: Secondary | ICD-10-CM | POA: Diagnosis not present

## 2019-03-29 DIAGNOSIS — N183 Chronic kidney disease, stage 3 unspecified: Secondary | ICD-10-CM | POA: Diagnosis not present

## 2019-03-29 DIAGNOSIS — R600 Localized edema: Secondary | ICD-10-CM | POA: Diagnosis not present

## 2019-03-29 DIAGNOSIS — R0602 Shortness of breath: Secondary | ICD-10-CM | POA: Diagnosis not present

## 2019-03-29 DIAGNOSIS — I83893 Varicose veins of bilateral lower extremities with other complications: Secondary | ICD-10-CM | POA: Diagnosis not present

## 2019-03-29 DIAGNOSIS — J301 Allergic rhinitis due to pollen: Secondary | ICD-10-CM | POA: Diagnosis not present

## 2019-04-02 ENCOUNTER — Ambulatory Visit: Payer: PRIVATE HEALTH INSURANCE

## 2019-04-03 ENCOUNTER — Encounter: Payer: Self-pay | Admitting: Family Medicine

## 2019-04-03 ENCOUNTER — Ambulatory Visit (INDEPENDENT_AMBULATORY_CARE_PROVIDER_SITE_OTHER): Payer: Medicare Other | Admitting: Family Medicine

## 2019-04-03 ENCOUNTER — Other Ambulatory Visit: Payer: Self-pay

## 2019-04-03 VITALS — BP 129/72 | HR 53 | Temp 97.7°F | Ht 63.86 in | Wt 158.0 lb

## 2019-04-03 DIAGNOSIS — I700 Atherosclerosis of aorta: Secondary | ICD-10-CM

## 2019-04-03 DIAGNOSIS — Z0000 Encounter for general adult medical examination without abnormal findings: Secondary | ICD-10-CM | POA: Diagnosis not present

## 2019-04-03 DIAGNOSIS — J449 Chronic obstructive pulmonary disease, unspecified: Secondary | ICD-10-CM | POA: Diagnosis not present

## 2019-04-03 DIAGNOSIS — M810 Age-related osteoporosis without current pathological fracture: Secondary | ICD-10-CM

## 2019-04-03 DIAGNOSIS — E038 Other specified hypothyroidism: Secondary | ICD-10-CM | POA: Diagnosis not present

## 2019-04-03 DIAGNOSIS — N183 Chronic kidney disease, stage 3 unspecified: Secondary | ICD-10-CM

## 2019-04-03 DIAGNOSIS — I2583 Coronary atherosclerosis due to lipid rich plaque: Secondary | ICD-10-CM

## 2019-04-03 DIAGNOSIS — I2510 Atherosclerotic heart disease of native coronary artery without angina pectoris: Secondary | ICD-10-CM | POA: Diagnosis not present

## 2019-04-03 DIAGNOSIS — R8271 Bacteriuria: Secondary | ICD-10-CM | POA: Diagnosis not present

## 2019-04-03 DIAGNOSIS — I129 Hypertensive chronic kidney disease with stage 1 through stage 4 chronic kidney disease, or unspecified chronic kidney disease: Secondary | ICD-10-CM | POA: Diagnosis not present

## 2019-04-03 DIAGNOSIS — E785 Hyperlipidemia, unspecified: Secondary | ICD-10-CM

## 2019-04-03 NOTE — Assessment & Plan Note (Signed)
Checking labs today. Await results. Continue to monitor.  

## 2019-04-03 NOTE — Progress Notes (Signed)
BP 129/72   Pulse (!) 53   Temp 97.7 F (36.5 C)   Ht 5' 3.86" (1.622 m)   Wt 158 lb (71.7 kg)   LMP  (LMP Unknown)   SpO2 99%   BMI 27.24 kg/m    Subjective:    Patient ID: Julia Deleon, female    DOB: 08-29-1945, 73 y.o.   MRN: [id number]  HPI: Julia Deleon is a 73 y.o. female presenting on 04/03/2019 for comprehensive medical examination. Current medical complaints include:  HYPERTENSION / HYPERLIPIDEMIA Satisfied with current treatment? no Duration of hypertension: chronic BP monitoring frequency: not checking BP medication side effects: no Past BP meds: losartan Duration of hyperlipidemia: chronic Cholesterol medication side effects: no Cholesterol supplements: none Past cholesterol medications: none currently Medication compliance: excellent compliance Aspirin: yes Recent stressors: no Recurrent headaches: no Visual changes: no Palpitations: no Dyspnea: yes Chest pain: yes Lower extremity edema: no Dizzy/lightheaded: no  HYPOTHYROIDISM Thyroid control status:controlled Satisfied with current treatment? yes Medication side effects: no Medication compliance: excellent compliance Etiology of hypothyroidism:  Recent dose adjustment:no Fatigue: yes Cold intolerance: no Heat intolerance: no Weight gain: no Weight loss: no Constipation: no Diarrhea/loose stools: no Palpitations: no Lower extremity edema: no Anxiety/depressed mood: no  She currently lives with: husband Menopausal Symptoms: no  Functional Status Survey: Is the patient deaf or have difficulty hearing?: No Does the patient have difficulty seeing, even when wearing glasses/contacts?: No Does the patient have difficulty concentrating, remembering, or making decisions?: No Does the patient have difficulty walking or climbing stairs?: Yes Does the patient have difficulty dressing or bathing?: No Does the patient have difficulty doing errands alone such as visiting a doctor's office or  shopping?: No  Fall Risk  04/03/2019 03/30/2018 11/23/2017 10/17/2017 06/06/2017  Falls in the past year? 0 1 Yes Yes No  Number falls in past yr: 0 _0 -  Injury with Fall? 0 0 No No -  Follow up - - - Falls evaluation completed -    Depression Screen Depression screen [facility] 2/9 04/03/2019 03/30/2018 06/06/2017 03/28/2017 03/28/2017  Decreased Interest 0 0 0 0 0  Down, Depressed, Hopeless 0 0 _1 PHQ - 2 Score 0 0 _2 Altered sleeping 1 - - 1 1  Tired, decreased energy 2 - - 1 0  Change in appetite 0 - - 0 0  Feeling bad or failure about yourself  0 - - 0 0  Trouble concentrating 0 - - 0 0  Moving slowly or fidgety/restless 0 - - 0 0  Suicidal thoughts 0 - - 0 0  PHQ-9 Score 3 - - 3 2  Difficult doing work/chores Not difficult at all - - - -   Advanced Directives Does patient have a HCPOA?    no Does patient have a living will or MOST form?  no  Past Medical History:  Past Medical History:  Diagnosis Date  . Arthritis   . COPD (chronic obstructive pulmonary disease) ([place])   . Family history of adverse reaction to anesthesia    father was slow to wake up  . Fuch's endothelial dystrophy   . Hyperlipidemia   . Hypothyroidism   . PONV (postoperative nausea and vomiting)    slow to wake up and PONV    Surgical History:  Past Surgical History:  Procedure Laterality Date  . APPENDECTOMY    . BACK SURGERY  09/26/2018   L4-5 PLIF by Dr. Arnoldo Morale  .  CARDIAC CATHETERIZATION    . CHOLECYSTECTOMY    . EYE SURGERY    . FOOT SURGERY Right    pin removed left  . laser vein surgery    . NASAL SINUS SURGERY    . RIGHT/LEFT HEART CATH AND CORONARY ANGIOGRAPHY N/A 12/06/2017   Procedure: RIGHT/LEFT HEART CATH AND CORONARY ANGIOGRAPHY;  Surgeon: Yolonda Kida, MD;  Location: [facility];  Service: Cardiovascular;  Laterality: N/A;  . TONSILLECTOMY      Medications:  Current Outpatient Medications on File Prior to Visit  Medication Sig  . EPINEPHrine 0.3 mg/0.3  mL IJ SOAJ injection   . acetaminophen (TYLENOL) 650 MG CR tablet Take 650-1,300 mg by mouth every 8 (eight) hours as needed for pain.   . Alpha-Lipoic Acid 100 MG TABS Take 100 mg by mouth daily with lunch.  Marland Kitchen azelastine (ASTELIN) 0.1 % nasal spray Place 1 spray into both nostrils daily. Use in each nostril as directed  . chlorzoxazone (PARAFON) 500 MG tablet Take 0.5 tablets (250 mg total) by mouth 3 (three) times daily.  . Cholecalciferol (VITAMIN D3) 2000 units TABS Take 2,000 Units by mouth daily.   . Coenzyme Q10 (COQ-10) 100 MG CAPS Take 100 mg by mouth daily.  Marland Kitchen CRANBERRY PO Take 1 capsule by mouth daily.  . Cyanocobalamin (B-12-SL) 1000 MCG SUBL Place 500 mcg under the tongue 2 (two) times a day.  Marland Kitchen DHEA 10 MG CAPS Take 10 mg by mouth daily.  Marland Kitchen estradiol (ESTRACE VAGINAL) 0.1 MG/GM vaginal cream Place 1 Applicatorful vaginally 3 (three) times a week. 0.5 twice weekly --COMPOUND PLS  . fexofenadine (ALLEGRA) 60 MG tablet Take 60 mg by mouth daily.  . Glucosamine-Chondroitin (GLUCOSAMINE CHONDR COMPLEX PO) Take 1 tablet by mouth 2 (two) times a day.  . Levomefolate Glucosamine (METHYLFOLATE) 400 MCG CAPS Take 400 mcg by mouth daily with lunch.  . levothyroxine (SYNTHROID) 75 MCG tablet TAKE ONE TABLET EACH MORNING BEFORE BREAKFAST  . losartan (COZAAR) 100 MG tablet TAKE ONE TABLET BY MOUTH EVERY DAY  . Multiple Vitamin (MULTIVITAMIN WITH MINERALS) TABS tablet Take 1 tablet by mouth every evening.  . Multiple Vitamins-Minerals (LUTEIN-ZEAXANTHIN PO) Take 1 tablet by mouth at bedtime.  . Probiotic CAPS Take 1 capsule by mouth at bedtime.  . Progesterone Micronized (PROGESTERONE, BULK,) POWD Apply 1 application topically See admin instructions. 4% compound- topically  . triamcinolone (NASACORT ALLERGY 24HR) 55 MCG/ACT AERO nasal inhaler Place 2 sprays daily into the nose.  . TURMERIC PO Take 1 tablet by mouth 2 (two) times a week.   No current facility-administered medications on file  prior to visit.     Allergies:  Allergies  Allergen Reactions  . Colesevelam Nausea And Vomiting, Nausea Only and Other (See Comments)    [facility] WELCHOL  . Levofloxacin Hives  . Benazepril Other (See Comments)  . Methylisothiazolinone Other (See Comments)    Positive on allergy test  . Thimerosal Other (See Comments)    Positive on allergy test  . Amoxicillin-Pot Clavulanate Nausea Only    Did it involve swelling of the face/tongue/throat, SOB, or low BP? No Did it involve sudden or severe rash/hives, skin peeling, or any reaction on the inside of your mouth or nose? No Did you need to seek medical attention at a hospital or doctor's office? Unknown When did it last happen?unknown If all above answers are "NO", may proceed with cephalosporin use.   . Colesevelam Hcl Nausea Only  Primera  . Latex Rash    Family history of latex allergy  . Simvastatin Itching    Social History:  Social History   Socioeconomic History  . Marital status: Married    Spouse name: Not on file  . Number of children: Not on file  . Years of education: 4  . Highest education level: Bachelor's degree (e.g., BA, AB, BS)  Occupational History  . Not on file  Social Needs  . Financial resource strain: Not hard at all  . Food insecurity    Worry: Never true    Inability: Never true  . Transportation needs    Medical: No    Non-medical: No  Tobacco Use  . Smoking status: Never Smoker  . Smokeless tobacco: Never Used  Substance and Sexual Activity  . Alcohol use: No    Alcohol/week: 0.0 standard drinks  . Drug use: No  . Sexual activity: Yes    Birth control/protection: Post-menopausal  Lifestyle  . Physical activity    Days per week: 2 days    Minutes per session: 60 min  . Stress: Only a little  Relationships  . Social connections    Talks on phone: More than three times a week    Gets together: Once a week    Attends religious service: More than 4 times  per year    Active member of club or organization: No    Attends meetings of clubs or organizations: Never    Relationship status: Married  . Intimate partner violence    Fear of current or ex partner: No    Emotionally abused: No    Physically abused: No    Forced sexual activity: No  Other Topics Concern  . Not on file  Social History Narrative  . Not on file   Social History   Tobacco Use  Smoking Status Never Smoker  Smokeless Tobacco Never Used   Social History   Substance and Sexual Activity  Alcohol Use No  . Alcohol/week: 0.0 standard drinks    Family History:  Family History  Problem Relation Age of Onset  . Cancer Mother        lung  . CAD Mother   . Stroke Father   . Cancer Sister        breast  . Breast cancer Sister 72  . Varicose Veins Son   . Heart disease Maternal Grandmother   . Cancer Paternal Grandmother        throat  . Emphysema Paternal Grandfather   . Fibromyalgia Sister   . Brain cancer Sister   . Breast cancer Paternal Aunt     Past medical history, surgical history, medications, allergies, family history and social history reviewed with patient today and changes made to appropriate areas of the chart.   Review of Systems  Constitutional: Negative.   HENT: Negative.   Eyes: Negative.   Respiratory: Positive for shortness of breath. Negative for cough, hemoptysis, sputum production and wheezing.   Cardiovascular: Positive for chest pain. Negative for palpitations, orthopnea, claudication, leg swelling and PND.  Gastrointestinal: Negative.   Genitourinary: Negative.        Not having to self-cath any more   Musculoskeletal: Positive for back pain and myalgias. Negative for falls, joint pain and neck pain.  Skin: Negative.   Neurological: Negative.   Endo/Heme/Allergies: Positive for environmental allergies. Negative for polydipsia. Does not bruise/bleed easily.  Psychiatric/Behavioral: Negative.     All other ROS negative except  what  is listed above and in the HPI.      Objective:    BP 129/72   Pulse (!) 53   Temp 97.7 F (36.5 C)   Ht 5' 3.86" (1.622 m)   Wt 158 lb (71.7 kg)   LMP  (LMP Unknown)   SpO2 99%   BMI 27.24 kg/m   Wt Readings from Last 3 Encounters:  04/03/19 158 lb (71.7 kg)  11/13/18 160 lb (72.6 kg)  09/30/18 159 lb 6.3 oz (72.3 kg)    Physical Exam Vitals signs and nursing note reviewed.  Constitutional:      General: She is not in acute distress.    Appearance: Normal appearance. She is not ill-appearing, toxic-appearing or diaphoretic.  HENT:     Head: Normocephalic and atraumatic.     Right Ear: Tympanic membrane, ear canal and external ear normal. There is no impacted cerumen.     Left Ear: Tympanic membrane, ear canal and external ear normal. There is no impacted cerumen.     Nose: Nose normal. No congestion or rhinorrhea.     Mouth/Throat:     Mouth: Mucous membranes are moist.     Pharynx: Oropharynx is clear. No oropharyngeal exudate or posterior oropharyngeal erythema.  Eyes:     General: No scleral icterus.       Right eye: No discharge.        Left eye: No discharge.     Extraocular Movements: Extraocular movements intact.     Conjunctiva/sclera: Conjunctivae normal.     Pupils: Pupils are equal, round, and reactive to light.  Neck:     Musculoskeletal: Normal range of motion and neck supple. No neck rigidity or muscular tenderness.     Vascular: No carotid bruit.  Cardiovascular:     Rate and Rhythm: Normal rate and regular rhythm.     Pulses: Normal pulses.     Heart sounds: No murmur. No friction rub. No gallop.   Pulmonary:     Effort: Pulmonary effort is normal. No respiratory distress.     Breath sounds: Normal breath sounds. No stridor. No wheezing, rhonchi or rales.  Chest:     Chest wall: No tenderness.  Abdominal:     General: Abdomen is flat. Bowel sounds are normal. There is no distension.     Palpations: Abdomen is soft. There is no mass.      Tenderness: There is no abdominal tenderness. There is no right CVA tenderness, left CVA tenderness, guarding or rebound.     Hernia: No hernia is present.  Genitourinary:    Comments: Breast and pelvic exams deferred with shared decision making Musculoskeletal:        General: No swelling, tenderness, deformity or signs of injury.     Right lower leg: No edema.     Left lower leg: No edema.  Lymphadenopathy:     Cervical: No cervical adenopathy.  Skin:    General: Skin is warm and dry.     Capillary Refill: Capillary refill takes less than 2 seconds.     Coloration: Skin is not jaundiced or pale.     Findings: No bruising, erythema, lesion or rash.  Neurological:     General: No focal deficit present.     Mental Status: She is alert and oriented to person, place, and time. Mental status is at baseline.     Cranial Nerves: No cranial nerve deficit.     Sensory: No sensory deficit.     Motor: No  weakness.     Coordination: Coordination normal.     Gait: Gait normal.     Deep Tendon Reflexes: Reflexes normal.  Psychiatric:        Mood and Affect: Mood normal.        Behavior: Behavior normal.        Thought Content: Thought content normal.        Judgment: Judgment normal.     6CIT Screen 04/03/2019 03/30/2018 03/28/2017  What Year? 0 points 0 points 0 points  What month? 0 points 0 points 0 points  What time? 0 points 0 points 0 points  Count back from 20 0 points 0 points 0 points  Months in reverse 0 points 0 points 0 points  Repeat phrase 0 points 0 points 0 points  Total Score 0 0 0    Results for orders placed or performed in visit on 11/13/18  Cytology - PAP  Result Value Ref Range   Adequacy (A)     Satisfactory for evaluation  endocervical/transformation zone component PRESENT.   Diagnosis (A)     ATYPICAL SQUAMOUS CELLS OF UNDETERMINED SIGNIFICANCE (ASC-US).   Material Submitted CervicoVaginal Pap [ThinPrep Imaged] (A)       Assessment & Plan:   Problem  List Items Addressed This Visit      Cardiovascular and Mediastinum   CAD (coronary artery disease)    Continue to follow with cardiology. Will keep BP and cholesterol under good control. Continue to monitor.      Relevant Medications   EPINEPHrine 0.3 mg/0.3 mL IJ SOAJ injection   Other Relevant Orders   CBC with Differential OUT   Comp Met (CMET)   UA/M w/rflx Culture, Routine   Aortic atherosclerosis ([place])    Continue to follow with cardiology. Will keep BP and cholesterol under good control. Continue to monitor.      Relevant Medications   EPINEPHrine 0.3 mg/0.3 mL IJ SOAJ injection   Other Relevant Orders   CBC with Differential OUT   Comp Met (CMET)   UA/M w/rflx Culture, Routine     Respiratory   COPD (chronic obstructive pulmonary disease) (HCC)    Under good control on current regimen. Continue current regimen. Continue to monitor. Call with any concerns. Refills given.        Relevant Orders   CBC with Differential OUT   Comp Met (CMET)   UA/M w/rflx Culture, Routine     Endocrine   Hypothyroidism    Rechecking labs today. Await results. Call with any concerns. Continue to monitor.       Relevant Orders   CBC with Differential OUT   Comp Met (CMET)   TSH   UA/M w/rflx Culture, Routine     Musculoskeletal and Integument   Osteoporosis    Checking labs. Call with any concerns. Continue to monitor.       Relevant Orders   CBC with Differential OUT   Comp Met (CMET)   UA/M w/rflx Culture, Routine   Vit D  25 hydroxy (rtn osteoporosis monitoring)     Genitourinary   Benign hypertensive renal disease    Under good control on current regimen. Continue current regimen. Continue to monitor. Call with any concerns. Refills given. Labs drawn today.       Relevant Orders   CBC with Differential OUT   Comp Met (CMET)   Microalbumin, Urine Waived   UA/M w/rflx Culture, Routine   Chronic kidney disease, stage III (moderate) (HCC)  Checking labs today.  Await results. Continue to monitor.       Relevant Orders   CBC with Differential OUT   Comp Met (CMET)   Microalbumin, Urine Waived   UA/M w/rflx Culture, Routine     Other   Hyperlipidemia    Checking labs. Await results. Treat as needed.       Relevant Medications   EPINEPHrine 0.3 mg/0.3 mL IJ SOAJ injection   Other Relevant Orders   CBC with Differential OUT   Comp Met (CMET)   Lipid Panel w/o Chol/HDL Ratio OUT   UA/M w/rflx Culture, Routine    Other Visit Diagnoses    Medicare annual wellness visit, subsequent    -  Primary   Preventative care discussed today. Continue diet and exercise. Pap, mammo and colonoscopy up to date.        Preventative Services:  Health Risk Assessment and Personalized Prevention Plan: up to date Bone Mass Measurements: Up to date Breast Cancer Screening: Up to date CVD Screening: Done today Cervical Cancer Screening: Up to date Colon Cancer Screening: Up to date Depression Screening: Done today Diabetes Screening: Done today Glaucoma Screening: See your eye doctor Hepatitis B vaccine: N/A Hepatitis C screening: Up to date HIV Screening: Up to date Flu Vaccine: Up to date Lung cancer Screening: N/A Obesity Screening: Done today Pneumonia Vaccines (2): Up to date STI Screening: N/A  Follow up plan: Return in about 6 months (around 10/01/2019).   LABORATORY TESTING:  - Pap smear: not applicable  IMMUNIZATIONS:   - Tdap: Tetanus vaccination status reviewed: last tetanus booster within 10 years. - Influenza: Up to date - Pneumovax: Up to date - Prevnar: Up to date - Zostavax vaccine: Up to date  SCREENING: -Mammogram: Up to date  - Colonoscopy: Up to date  - Bone Density: Up to date   PATIENT COUNSELING:   Advised to take 1 mg of folate supplement per day if capable of pregnancy.   Sexuality: Discussed sexually transmitted diseases, partner selection, use of condoms, avoidance of unintended pregnancy  and contraceptive  alternatives.   Advised to avoid cigarette smoking.  I discussed with the patient that most people either abstain from alcohol or drink within safe limits (<=14/week and <=4 drinks/occasion for males, <=7/weeks and <= 3 drinks/occasion for females) and that the risk for alcohol disorders and other health effects rises proportionally with the number of drinks per week and how often a drinker exceeds daily limits.  Discussed cessation/primary prevention of drug use and availability of treatment for abuse.   Diet: Encouraged to adjust caloric intake to maintain  or achieve ideal body weight, to reduce intake of dietary saturated fat and total fat, to limit sodium intake by avoiding high sodium foods and not adding table salt, and to maintain adequate dietary potassium and calcium preferably from fresh fruits, vegetables, and low-fat dairy products.    stressed the importance of regular exercise  Injury prevention: Discussed safety belts, safety helmets, smoke detector, smoking near bedding or upholstery.   Dental health: Discussed importance of regular tooth brushing, flossing, and dental visits.    NEXT PREVENTATIVE PHYSICAL DUE IN 1 YEAR. Return in about 6 months (around 10/01/2019).

## 2019-04-03 NOTE — Patient Instructions (Addendum)
Preventative Services:  Health Risk Assessment and Personalized Prevention Plan: up to date Bone Mass Measurements: Up to date Breast Cancer Screening: Up to date CVD Screening: Done today Cervical Cancer Screening: Up to date Colon Cancer Screening: Up to date Depression Screening: Done today Diabetes Screening: Done today Glaucoma Screening: See your eye doctor Hepatitis B vaccine: N/A Hepatitis C screening: Up to date HIV Screening: Up to date Flu Vaccine: Up to date Lung cancer Screening: N/A Obesity Screening: Done today Pneumonia Vaccines (2): Up to date STI Screening: N/A   Health Maintenance After Age 91 After age 50, you are at a higher risk for certain long-term diseases and infections as well as injuries from falls. Falls are a major cause of broken bones and head injuries in people who are older than age 65. Getting regular preventive care can help to keep you healthy and well. Preventive care includes getting regular testing and making lifestyle changes as recommended by your health care provider. Talk with your health care provider about:  Which screenings and tests you should have. A screening is a test that checks for a disease when you have no symptoms.  A diet and exercise plan that is right for you. What should I know about screenings and tests to prevent falls? Screening and testing are the best ways to find a health problem early. Early diagnosis and treatment give you the best chance of managing medical conditions that are common after age 3. Certain conditions and lifestyle choices may make you more likely to have a fall. Your health care provider may recommend:  Regular vision checks. Poor vision and conditions such as cataracts can make you more likely to have a fall. If you wear glasses, make sure to get your prescription updated if your vision changes.  Medicine review. Work with your health care provider to regularly review all of the medicines you are  taking, including over-the-counter medicines. Ask your health care provider about any side effects that may make you more likely to have a fall. Tell your health care provider if any medicines that you take make you feel dizzy or sleepy.  Osteoporosis screening. Osteoporosis is a condition that causes the bones to get weaker. This can make the bones weak and cause them to break more easily.  Blood pressure screening. Blood pressure changes and medicines to control blood pressure can make you feel dizzy.  Strength and balance checks. Your health care provider may recommend certain tests to check your strength and balance while standing, walking, or changing positions.  Foot health exam. Foot pain and numbness, as well as not wearing proper footwear, can make you more likely to have a fall.  Depression screening. You may be more likely to have a fall if you have a fear of falling, feel emotionally low, or feel unable to do activities that you used to do.  Alcohol use screening. Using too much alcohol can affect your balance and may make you more likely to have a fall. What actions can I take to lower my risk of falls? General instructions  Talk with your health care provider about your risks for falling. Tell your health care provider if: ? You fall. Be sure to tell your health care provider about all falls, even ones that seem minor. ? You feel dizzy, sleepy, or off-balance.  Take over-the-counter and prescription medicines only as told by your health care provider. These include any supplements.  Eat a healthy diet and maintain a healthy weight.  A healthy diet includes low-fat dairy products, low-fat (lean) meats, and fiber from whole grains, beans, and lots of fruits and vegetables. Home safety  Remove any tripping hazards, such as rugs, cords, and clutter.  Install safety equipment such as grab bars in bathrooms and safety rails on stairs.  Keep rooms and walkways  well-lit. Activity   Follow a regular exercise program to stay fit. This will help you maintain your balance. Ask your health care provider what types of exercise are appropriate for you.  If you need a cane or walker, use it as recommended by your health care provider.  Wear supportive shoes that have nonskid soles. Lifestyle  Do not drink alcohol if your health care provider tells you not to drink.  If you drink alcohol, limit how much you have: ? 0-1 drink a day for women. ? 0-2 drinks a day for men.  Be aware of how much alcohol is in your drink. In the [place] one drink equals one typical bottle of beer (12 oz), one-half glass of wine (5 oz), or one shot of hard liquor (1 oz).  Do not use any products that contain nicotine or tobacco, such as cigarettes and e-cigarettes. If you need help quitting, ask your health care provider. Summary  Having a healthy lifestyle and getting preventive care can help to protect your health and wellness after age 90.  Screening and testing are the best way to find a health problem early and help you avoid having a fall. Early diagnosis and treatment give you the best chance for managing medical conditions that are more common for people who are older than age 40.  Falls are a major cause of broken bones and head injuries in people who are older than age 8. Take precautions to prevent a fall at home.  Work with your health care provider to learn what changes you can make to improve your health and wellness and to prevent falls. This information is not intended to replace advice given to you by your health care provider. Make sure you discuss any questions you have with your health care provider. Document Released: 03/09/2017 Document Revised: 08/17/2018 Document Reviewed: 03/09/2017 Elsevier Patient Education  2020 [employer].

## 2019-04-03 NOTE — Assessment & Plan Note (Signed)
Checking labs. Call with any concerns. Continue to monitor.

## 2019-04-03 NOTE — Assessment & Plan Note (Signed)
Continue to follow with cardiology. Will keep BP and cholesterol under good control. Continue to monitor.

## 2019-04-03 NOTE — Assessment & Plan Note (Signed)
Rechecking labs today. Await results. Call with any concerns. Continue to monitor.

## 2019-04-03 NOTE — Assessment & Plan Note (Signed)
Checking labs. Await results. Treat as needed.

## 2019-04-03 NOTE — Assessment & Plan Note (Signed)
Under good control on current regimen. Continue current regimen. Continue to monitor. Call with any concerns. Refills given. Labs drawn today.   

## 2019-04-03 NOTE — Assessment & Plan Note (Signed)
Under good control on current regimen. Continue current regimen. Continue to monitor. Call with any concerns. Refills given.   

## 2019-04-04 ENCOUNTER — Encounter: Payer: Self-pay | Admitting: Family Medicine

## 2019-04-04 LAB — COMPREHENSIVE METABOLIC PANEL
ALT: 18 IU/L (ref 0–32)
AST: 17 IU/L (ref 0–40)
Albumin/Globulin Ratio: 2 (ref 1.2–2.2)
Albumin: 4.5 g/dL (ref 3.7–4.7)
Alkaline Phosphatase: 55 IU/L (ref 39–117)
BUN/Creatinine Ratio: 21 (ref 12–28)
BUN: 23 mg/dL (ref 8–27)
Bilirubin Total: 0.6 mg/dL (ref 0.0–1.2)
CO2: 22 mmol/L (ref 20–29)
Calcium: 9.2 mg/dL (ref 8.7–10.3)
Chloride: 102 mmol/L (ref 96–106)
Creatinine, Ser: 1.07 mg/dL — ABNORMAL HIGH (ref 0.57–1.00)
GFR calc Af Amer: 60 mL/min/{1.73_m2} (ref >59)
GFR calc non Af Amer: 52 mL/min/{1.73_m2} — ABNORMAL LOW (ref >59)
Globulin, Total: 2.3 g/dL (ref 1.5–4.5)
Glucose: 88 mg/dL (ref 65–99)
Potassium: 4.5 mmol/L (ref 3.5–5.2)
Sodium: 140 mmol/L (ref 134–144)
Total Protein: 6.8 g/dL (ref 6.0–8.5)

## 2019-04-04 LAB — CBC WITH DIFFERENTIAL/PLATELET
Basophils Absolute: 0.1 10*3/uL (ref 0.0–0.2)
Basos: 1 %
EOS (ABSOLUTE): 0.2 10*3/uL (ref 0.0–0.4)
Eos: 3 %
Hematocrit: 41.4 % (ref 34.0–46.6)
Hemoglobin: 13.3 g/dL (ref 11.1–15.9)
Immature Grans (Abs): 0.1 10*3/uL (ref 0.0–0.1)
Immature Granulocytes: 1 %
Lymphocytes Absolute: 2 10*3/uL (ref 0.7–3.1)
Lymphs: 30 %
MCH: 30.9 pg (ref 26.6–33.0)
MCHC: 32.1 g/dL (ref 31.5–35.7)
MCV: 96 fL (ref 79–97)
Monocytes Absolute: 0.5 10*3/uL (ref 0.1–0.9)
Monocytes: 8 %
Neutrophils Absolute: 3.9 10*3/uL (ref 1.4–7.0)
Neutrophils: 57 %
Platelets: 209 10*3/uL (ref 150–450)
RBC: 4.31 x10E6/uL (ref 3.77–5.28)
RDW: 12.8 % (ref 11.7–15.4)
WBC: 6.8 10*3/uL (ref 3.4–10.8)

## 2019-04-04 LAB — LIPID PANEL W/O CHOL/HDL RATIO
Cholesterol, Total: 223 mg/dL — ABNORMAL HIGH (ref 100–199)
HDL: 41 mg/dL (ref >39)
LDL Chol Calc (NIH): 155 mg/dL — ABNORMAL HIGH (ref 0–99)
Triglycerides: 150 mg/dL — ABNORMAL HIGH (ref 0–149)
VLDL Cholesterol Cal: 27 mg/dL (ref 5–40)

## 2019-04-04 LAB — TSH: TSH: 3.26 u[IU]/mL (ref 0.450–4.500)

## 2019-04-04 LAB — VITAMIN D 25 HYDROXY (VIT D DEFICIENCY, FRACTURES): Vit D, 25-Hydroxy: 47.6 ng/mL (ref 30.0–100.0)

## 2019-04-07 LAB — MICROALBUMIN, URINE WAIVED
Creatinine, Urine Waived: 100 mg/dL (ref 10–300)
Microalb, Ur Waived: 10 mg/L (ref 0–19)
Microalb/Creat Ratio: <30 mg/g (ref <30)

## 2019-04-07 LAB — MICROSCOPIC EXAMINATION: RBC, Urine: NONE SEEN /hpf (ref 0–2)

## 2019-04-07 LAB — UA/M W/RFLX CULTURE, ROUTINE
Bilirubin, UA: NEGATIVE
Glucose, UA: NEGATIVE
Ketones, UA: NEGATIVE
Nitrite, UA: POSITIVE — AB
Protein,UA: NEGATIVE
RBC, UA: NEGATIVE
Specific Gravity, UA: 1.02 (ref 1.005–1.030)
Urobilinogen, Ur: 0.2 mg/dL (ref 0.2–1.0)
pH, UA: 5.5 (ref 5.0–7.5)

## 2019-04-07 LAB — URINE CULTURE, REFLEX

## 2019-04-09 ENCOUNTER — Other Ambulatory Visit: Payer: Self-pay

## 2019-04-09 ENCOUNTER — Ambulatory Visit: Payer: Medicare Other | Admitting: Physical Therapy

## 2019-04-09 ENCOUNTER — Telehealth: Payer: Self-pay | Admitting: Family Medicine

## 2019-04-09 DIAGNOSIS — M62838 Other muscle spasm: Secondary | ICD-10-CM

## 2019-04-09 DIAGNOSIS — R293 Abnormal posture: Secondary | ICD-10-CM

## 2019-04-09 DIAGNOSIS — M2170 Unequal limb length (acquired), unspecified site: Secondary | ICD-10-CM | POA: Diagnosis not present

## 2019-04-09 DIAGNOSIS — R2689 Other abnormalities of gait and mobility: Secondary | ICD-10-CM | POA: Diagnosis not present

## 2019-04-09 MED ORDER — NITROFURANTOIN MONOHYD MACRO 100 MG PO CAPS: 100.0000 mg | ORAL_CAPSULE | Freq: Two times a day (BID) | ORAL | 0 refills | Status: DC

## 2019-04-09 NOTE — Patient Instructions (Signed)
   Letter T, seeasaw on one leg with band band under L foot, wrap by big toe then, outer knee/ thigh, L hand pulling ( elbow by side)  Plant [place], toes spread, thigh out against band,, tracking knee in line with 2-3rd toe line Dipping forward, R foot lifts slight ( whole body like a see saw) off floor or  Press back foot against wall 10 x 2  Both

## 2019-04-09 NOTE — Therapy (Signed)
[place] MAIN [facility] SERVICES [address] [place], [place], [address] Phone: [phone]   Fax:  [phone]  Physical Therapy Treatment  Patient Details  Name: Julia Deleon MRN: [id number] Date of Birth: Mar 13, 1946 Referring Provider (PT): Dr. Maryan Puls   Encounter Date: 04/09/2019  PT End of Session - 04/09/19 1926    Visit Number  38    Number of Visits  56    Date for PT Re-Evaluation  05/15/19    Authorization Type  Medicare    Authorization Time Period  05/15/2019    Authorization - Visit Number  2   last progress note submitted 03/19/19   Authorization - Number of Visits  20    PT Start Time  9476    PT Stop Time  1712    PT Time Calculation (min)  62 min    Activity Tolerance  Patient tolerated treatment well;No increased pain    Behavior During Therapy  WFL for tasks assessed/performed       Past Medical History:  Diagnosis Date  . Arthritis   . COPD (chronic obstructive pulmonary disease) ([place])   . Family history of adverse reaction to anesthesia    father was slow to wake up  . Fuch's endothelial dystrophy   . Hyperlipidemia   . Hypothyroidism   . PONV (postoperative nausea and vomiting)    slow to wake up and PONV    Past Surgical History:  Procedure Laterality Date  . APPENDECTOMY    . BACK SURGERY  09/26/2018   L4-5 PLIF by Dr. Arnoldo Morale  . CARDIAC CATHETERIZATION    . CHOLECYSTECTOMY    . EYE SURGERY    . FOOT SURGERY Right    pin removed left  . laser vein surgery    . NASAL SINUS SURGERY    . RIGHT/LEFT HEART CATH AND CORONARY ANGIOGRAPHY N/A 12/06/2017   Procedure: RIGHT/LEFT HEART CATH AND CORONARY ANGIOGRAPHY;  Surgeon: Yolonda Kida, MD;  Location: [facility];  Service: Cardiovascular;  Laterality: N/A;  . TONSILLECTOMY      There were no vitals filed for this visit.  Subjective Assessment - 04/09/19 1626    Subjective  Pt reported she needs to schedule PT sessions on a different  day than her gym day. with trainer because she gets tired that night and gets leg cramps.         [facility] PT Assessment - 04/09/19 1621      Observation/Other Assessments   Observations  L ankle higher, 2/2 to L knee flexion, toe abduction improved compared past visit        Strength   Overall Strength Comments  L PF 7 reps, R 10 reps       Palpation   Spinal mobility  no more prominence of mm tightness on R QL, less posterior trunk rotation 2/2 scoliosis.     SI assessment   R iliac crest higher, L knee flexion     Palpation comment  no tightness along ITband, leg on L. Tightenss with radiating pain with palpation at obt int/ coccygeus L and at attachments of ischial tuberosity       Ambulation/Gait   Gait Comments  more L anterior rotation of pelvic girdle, L kneee flexion persists, less R trunk lean. Improved mechanics with use of hiking poles and shoe lift in L shoe. Gait assessments ,   90 ft pre Tx with report of tightness along L thigh not pain,  90 ft with shoe lift in L shoe, hiking poles, tightness present.                       [facility] Adult PT Treatment/Exercise - 04/09/19 1621      Therapeutic Activites    Other Therapeutic Activities  Gait assessments.         Neuro Re-ed    Neuro Re-ed Details   cued for new HEP to address L lower kinetich chain deficits with resistance band ( tactile and visual cue)    Provided body scan/ relaxation practice after manual Tx as pt reported feeling fatigued      Modalities   Modalities  Moist Heat      Moist Heat Therapy   Number Minutes Moist Heat  5 Minutes    Moist Heat Location  --   L glut      Manual Therapy   Manual therapy comments  STM/MWM at obt int./ coccygeus, ischial tuberosity attachments.   AP mob at medial L knee to promote knee ext                PT Short Term Goals - 03/23/19 1221      PT SHORT TERM GOAL #1   Title  Patient will demonstrate improved pelvic alignment and balance of  musculature surrounding the pelvis to facilitate decreased PFM spasms and decrease pelvic pain.    Baseline  Pt. demonstrates severe posterior pelvic tilt, L thoracic/R lumbar scoliosis, and R anterior/L posterior pelvic obliquity. Pt. has demonstrated improved pelvic alignment and posture but has not been able to sustain long-term due to continued lack of postural strength.    Time  5    Period  Weeks    Status  Achieved    Target Date  03/06/19      PT SHORT TERM GOAL #2   Title  Patient will demonstrate a coordinated contraction, relaxation, and bulge of the pelvic floor muscles to demonstrate functional recruitment and motion and allow for bladder emptying.    Baseline  Pt. is unable to fully empty bladder and describes pain with catheterization, demonstrating high tone of PFM.    Time  5    Period  Weeks    Status  Achieved    Target Date  03/06/19      PT SHORT TERM GOAL #3   Title  Patient will demonstrate improved sitting and standing posture to demonstrate learning and decrease stress on the pelvic floor with functional activity.    Baseline  Pt. demonstrates severe posterior pelvic tilt, L thoracic/R lumbar scoliosis, and R anterior/L posterior pelvic obliquity. 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 Will be able to cath without pain and will be cleared by physiscian to d/c self-cath due to decreased residual volume with bladder emptying.    Baseline  required to cath 3 times per day, painful each time.    Time  10    Period  Weeks    Status  Achieved      PT LONG TERM GOAL #4   Title  Patient will score less than or equal to 40% on the Female NIH-CPSI and 30% on the [facility] to demonstrate a reduction in pain, urinary symptoms, and an improved quality of life.    Baseline  Female NIH-CPSI: 38/43 (88%) , PDI: 45/75 (60%); PDI 44/75 (59%) and Female NIH-CPSI: 27/43 (64%) on 02-08-19 , Female NIH-CPSI: 17% on 03-26-19    Time  10    Period  Weeks    Status  Achieved      PT LONG TERM GOAL #5   Title  Patient will demonstrate increased step length with reciprical arm swing, and demonstrate no scissoring steps.    Baseline  Pt currently demonstrated scissoring steppage on R LE, increased trunk rotation and deep core engagement with reciprical walking poles.    Time  10    Period  Weeks    Status  Achieved      PT LONG TERM GOAL #6   Title  Pt will report no radiating pain down LLE after walking treadmill for 10 min in order to progress with aerobic exercises    Baseline  after 7 min    Time  8    Period  Weeks    Status  On-going    Target Date  06/04/19      PT LONG TERM GOAL #7   Title  Pt will report walking up > 2 mornings in a row with pain level < 5-6/10 in order to demo maintanence of Tx benefits and more balance of mm tensions achieved with HEP, shoe lift to improve QOL    Baseline  7-8/10 level and benefits from Tx lasted only one morning  (03-19-19 and 03-23-19)    Time  10    Period  Weeks    Status  Partially Met      PT LONG TERM GOAL #8   Title  Pt  will be able to demo increased PF strength with unilateral UE support on wall from B 3/5 15 reps in order to achieve stronger push off in gait and walk longer distance    Baseline  B, L 7 reps  2/5, 10 reps R 3/5    Time  10    Period  Weeks    Status  New    Target Date  06/04/19            Plan - 04/09/19 1927    Clinical Impression Statement  Pt continues to show more upright posture, less posterior tilt of pelvis. Rotational component to scoliosis is less prominent  which was last week's focus, indicating HEP and manual Tx have helped this issue. IT band and leg tightness on L is not present despite pt's report of tightness  and not pain. Focused on pelvic floor assessment today which showed significant tightness at obturator internus, coccygeus, and attachments at ischial tuberosity on L with report of concurrent sign of radiating pain from L lateral thigh to medial aspect of knee with palpation in this area.  Following today's Tx, pt showed decreased tightness in this area and less pain with palpation.  Also addressed medial rotation of femur, knee flexion on L with manual Tx. Pt's toe abduction is improving but plantaflexion strength has not improved yet.   Shoe lift was provided to L shoe today which eilicited more levelled iliac crest. Pt had no increased complaints and demo'd less R trunk lean and hip IR/ medial rotation of R knee.  Added propioception and L lower kinetic chain strengthening exercise to HEP to promote less medial rotation of L knee, decrease L posterior pelvic floor tightness, optimize proper glut and foot arch activation.    Plan to focus and assess hamstring to help increased knee extension. Continue with plantarflexion and intrinsic feet strengthening. Regional interdependent approach continues to yield effective results for pt's spinal deviations. Pt continues to benefit from skilled PT.     Personal Factors and Comorbidities  Age;Comorbidity 3+    Comorbidities   Osteoporosis, scoliosis, Arthritis, COPD, recent lumbosacral fusion for spondylolisthesis.    Examination-Activity Limitations  Toileting;Sit;Stand;Bend;Lift;Carry    Examination-Participation Restrictions  Interpersonal Relationship;Yard Work;Cleaning;Laundry    Stability/Clinical Decision Making  Unstable/Unpredictable    Rehab Potential  Good    PT Frequency  2x / week    PT Duration  Other (comment)   20   PT Treatment/Interventions  ADLs/Self Care Home Management;Aquatic Therapy;Moist Heat;Electrical Stimulation;Traction;Therapeutic activities;Functional mobility training;Stair training;Gait training;Therapeutic exercise;Balance training;Neuromuscular re-education;Patient/family education;Manual techniques;Dry needling;Passive range of motion;Scar mobilization;Taping    Consulted and Agree with Plan of Care  Patient       Patient will benefit from skilled therapeutic intervention in order to improve the following deficits and impairments:  Abnormal gait, Decreased balance, Increased muscle spasms, Decreased range of motion, Decreased scar mobility, Improper body mechanics, Decreased coordination, Decreased strength, Increased fascial restricitons, Impaired flexibility, Postural dysfunction, Pain  Visit Diagnosis: Abnormal posture  Other muscle spasm     Problem List Patient Active Problem List   Diagnosis Date Noted  . Chronic UTI 12/06/2018  . Status post lumbar surgery 12/06/2018  . Spondylolisthesis, lumbar region 09/26/2018  . Aortic atherosclerosis ([place]) 08/31/2018  . CAD (coronary artery disease) 02/23/2018  . Advanced care planning/counseling discussion 03/28/2017  . Varicose veins of both lower extremities with complications 85/92/9244  . Arthritis 12/09/2016  . Vaginal atrophy 09/10/2015  . Menopausal hot flushes 09/10/2015  . Onychomycosis due to dermatophyte 04/07/2015  . Bursitis of right shoulder 02/26/2015  . Environmental and seasonal allergies 02/26/2015  .  Other allergic rhinitis 02/26/2015  . Osteoporosis 11/26/2014  . COPD (chronic obstructive pulmonary disease) ([place]) 11/26/2014  . Fuchs' corneal dystrophy 11/26/2014  . Benign hypertensive renal disease 11/26/2014  . Hyperlipidemia 11/26/2014  . Chronic kidney disease, stage III (moderate) ([place]) 11/26/2014  . Hypothyroidism 11/26/2014  . Endothelial corneal dystrophy 11/26/2014    Jerl Mina ,PT, DPT, E-RYT  04/09/2019, 7:27 PM  [place] MAIN [facility] SERVICES [address] [place], [place], [address] Phone: [phone]   Fax:  [phone]  Name: Clemmie C  Mcallister MRN: [id number] Date of Birth: [birth year]

## 2019-04-09 NOTE — Telephone Encounter (Signed)
Please let her know that she had a UTI so I've sent a medicine to her pharmacy. Thanks!

## 2019-04-09 NOTE — Telephone Encounter (Signed)
Patient notified

## 2019-04-12 ENCOUNTER — Other Ambulatory Visit: Payer: Self-pay

## 2019-04-12 ENCOUNTER — Ambulatory Visit: Payer: Medicare Other | Attending: Urology | Admitting: Physical Therapy

## 2019-04-12 DIAGNOSIS — R293 Abnormal posture: Secondary | ICD-10-CM | POA: Diagnosis present

## 2019-04-12 DIAGNOSIS — R2689 Other abnormalities of gait and mobility: Secondary | ICD-10-CM | POA: Diagnosis not present

## 2019-04-12 DIAGNOSIS — M62838 Other muscle spasm: Secondary | ICD-10-CM | POA: Insufficient documentation

## 2019-04-12 DIAGNOSIS — J301 Allergic rhinitis due to pollen: Secondary | ICD-10-CM | POA: Diagnosis not present

## 2019-04-12 NOTE — Therapy (Signed)
[place] MAIN [facility] SERVICES [address] [place], [place], [address] Phone: [phone]   Fax:  [phone]  Physical Therapy Treatment  Patient Details  Name: Julia Deleon MRN: [id number] Date of Birth: Aug 10, 1945 Referring Provider (PT): Dr. Maryan Puls   Encounter Date: 04/12/2019  PT End of Session - 04/12/19 1503    Visit Number  39    Number of Visits  79    Date for PT Re-Evaluation  05/15/19    Authorization Type  Medicare    Authorization Time Period  05/15/2019    Authorization - Visit Number  3   last progress note submitted 03/19/19   Authorization - Number of Visits  20    PT Start Time  1937    PT Stop Time  1510    PT Time Calculation (min)  65 min    Activity Tolerance  Patient tolerated treatment well;No increased pain    Behavior During Therapy  WFL for tasks assessed/performed       Past Medical History:  Diagnosis Date  . Arthritis   . COPD (chronic obstructive pulmonary disease) ([place])   . Family history of adverse reaction to anesthesia    father was slow to wake up  . Fuch's endothelial dystrophy   . Hyperlipidemia   . Hypothyroidism   . PONV (postoperative nausea and vomiting)    slow to wake up and PONV    Past Surgical History:  Procedure Laterality Date  . APPENDECTOMY    . BACK SURGERY  09/26/2018   L4-5 PLIF by Dr. Arnoldo Morale  . CARDIAC CATHETERIZATION    . CHOLECYSTECTOMY    . EYE SURGERY    . FOOT SURGERY Right    pin removed left  . laser vein surgery    . NASAL SINUS SURGERY    . RIGHT/LEFT HEART CATH AND CORONARY ANGIOGRAPHY N/A 12/06/2017   Procedure: RIGHT/LEFT HEART CATH AND CORONARY ANGIOGRAPHY;  Surgeon: Yolonda Kida, MD;  Location: [place] [facility];  Service: Cardiovascular;  Laterality: N/A;  . TONSILLECTOMY      There were no vitals filed for this visit.  Subjective Assessment - 04/12/19 1410    Subjective  Pt reported she felt a little better after leaving clinic. Pt  rode in the car for 4 hours the day after and the following day after tha,t pt sat for 4 hours for CEU course.  Pt felt the shoe lift causing increased tightness. Pt woke up this morning and the pain was increased and could hardly walk. It travelled below her knee. 8-9/10. Pt went to the chiropractor and the Tx brought down her pain slightly   Limitations  Sitting;Walking;Standing         [facility] PT Assessment - 04/12/19 1413      Palpation   Spinal mobility  increased tightenss at L medial scapula, paraspinal L, medial knee L     SI assessment   iliac crest levelled in barefeet in standing                   [facility] Adult PT Treatment/Exercise - 04/12/19 1506      Neuro Re-ed    Neuro Re-ed Details   guided relaxation, removed shoe lift , reviewed Past HEP exercise       Manual Therapy   Manual therapy comments  gentle rocking at probem areas, STM / MWM at medial scap L  PT Short Term Goals - 03/23/19 1221      PT SHORT TERM GOAL #1   Title  Patient will demonstrate improved pelvic alignment and balance of musculature surrounding the pelvis to facilitate decreased PFM spasms and decrease pelvic pain.    Baseline  Pt. demonstrates severe posterior pelvic tilt, L thoracic/R lumbar scoliosis, and R anterior/L posterior pelvic obliquity. Pt. has demonstrated improved pelvic alignment and posture but has not been able to sustain long-term due to continued lack of postural strength.    Time  5    Period  Weeks    Status  Achieved    Target Date  03/06/19      PT SHORT TERM GOAL #2   Title  Patient will demonstrate a coordinated contraction, relaxation, and bulge of the pelvic floor muscles to demonstrate functional recruitment and motion and allow for bladder emptying.    Baseline  Pt. is unable to fully empty bladder and describes pain with catheterization, demonstrating high tone of PFM.    Time  5    Period  Weeks    Status  Achieved    Target Date   03/06/19      PT SHORT TERM GOAL #3   Title  Patient will demonstrate improved sitting and standing posture to demonstrate learning and decrease stress on the pelvic floor with functional activity.    Baseline  Pt. demonstrates severe posterior pelvic tilt, L thoracic/R lumbar scoliosis, and R anterior/L posterior pelvic obliquity. 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 Will be able to cath without pain and will be cleared by physiscian to d/c self-cath due to decreased residual volume with bladder emptying.    Baseline  required to cath 3 times per day, painful each time.    Time  10    Period  Weeks    Status  Achieved      PT LONG TERM GOAL #4   Title  Patient will score less than or equal to 40% on the Female NIH-CPSI and 30% on the [facility] to demonstrate a reduction in pain, urinary symptoms, and an improved quality of life.  Baseline  Female NIH-CPSI: 38/43 (88%) , PDI: 45/75 (60%); PDI 44/75 (59%) and Female NIH-CPSI: 27/43 (64%) on 02-08-19 , Female NIH-CPSI: 17% on 03-26-19    Time  10     Period  Weeks    Status  Achieved      PT LONG TERM GOAL #5   Title  Patient will demonstrate increased step length with reciprical arm swing, and demonstrate no scissoring steps.    Baseline  Pt currently demonstrated scissoring steppage on R LE, increased trunk rotation and deep core engagement with reciprical walking poles.    Time  10    Period  Weeks    Status  Achieved      PT LONG TERM GOAL #6   Title  Pt will report no radiating pain down LLE after walking treadmill for 10 min in order to progress with aerobic exercises    Baseline  after 7 min    Time  8    Period  Weeks    Status  On-going    Target Date  06/04/19      PT LONG TERM GOAL #7   Title  Pt will report walking up > 2 mornings in a row with pain level < 5-6/10 in order to demo maintanence of Tx benefits and more balance of mm tensions achieved with HEP, shoe lift to improve QOL    Baseline  7-8/10 level and benefits from Tx lasted only one morning  (03-19-19 and 03-23-19)    Time  10    Period  Weeks    Status  Partially Met      PT LONG TERM GOAL #8   Title  Pt will be able to demo increased PF strength with unilateral UE support on wall from B 3/5 15 reps in order to achieve stronger push off in gait and walk longer distance    Baseline  B, L 7 reps  2/5, 10 reps R 3/5    Time  10    Period  Weeks    Status  New    Target Date  06/04/19            Plan - 04/12/19 1505    Clinical Impression Statement  Pt demo'd good carry over with decreased obturator internus mm tightness on L side which showed last Tx session's positive outcome. Pt's iliac crest also showed equal height today whereas last session, the R iliac crest was higher. Pt's rotation component to scoliosis also showed improvement and not as prominent.  Removed L heel lift and recommended a thinner flat insole for pt to try.   Reviewed single deadlift exercise which pt required excessive tactile and verbal cues for pelvic propioception.    Manual Tx today was to decrease pain and decrease mm tightness at medial scapula L, paraspinal L, and medial knee L. IT band has not shown tightness for the past visits. R knee flexion is still present in standing.   Plan to continue treating lower kinetic chain. Pt benefits from skilled PT.     Personal Factors and Comorbidities  Age;Comorbidity 3+    Comorbidities  Osteoporosis, scoliosis, Arthritis, COPD, recent lumbosacral fusion for spondylolisthesis.    Examination-Activity Limitations  Toileting;Sit;Stand;Bend;Lift;Carry    Examination-Participation Restrictions  Interpersonal Relationship;Yard Work;Cleaning;Laundry    Stability/Clinical Decision Making  Unstable/Unpredictable    Rehab Potential  Good    PT Frequency  2x / week    PT Duration  Other (comment)   20   PT  Treatment/Interventions  ADLs/Self Care Home Management;Aquatic Therapy;Moist Heat;Electrical Stimulation;Traction;Therapeutic activities;Functional mobility training;Stair training;Gait training;Therapeutic exercise;Balance training;Neuromuscular re-education;Patient/family education;Manual techniques;Dry needling;Passive range of motion;Scar mobilization;Taping    Consulted and Agree with Plan of Care  Patient       Patient will benefit from skilled therapeutic intervention in order to improve the following deficits and impairments:  Abnormal gait, Decreased balance, Increased muscle spasms, Decreased range of motion, Decreased scar mobility, Improper body mechanics, Decreased coordination, Decreased strength, Increased fascial restricitons, Impaired flexibility, Postural dysfunction, Pain  Visit Diagnosis: Abnormal posture  Other muscle spasm     Problem List Patient Active Problem List   Diagnosis Date Noted  . Chronic UTI 12/06/2018  . Status post lumbar surgery 12/06/2018  . Spondylolisthesis, lumbar region 09/26/2018  . Aortic atherosclerosis ([place]) 08/31/2018  . CAD (coronary artery disease)  02/23/2018  . Advanced care planning/counseling discussion 03/28/2017  . Varicose veins of both lower extremities with complications 55/16/1443  . Arthritis 12/09/2016  . Vaginal atrophy 09/10/2015  . Menopausal hot flushes 09/10/2015  . Onychomycosis due to dermatophyte 04/07/2015  . Bursitis of right shoulder 02/26/2015  . Environmental and seasonal allergies 02/26/2015  . Other allergic rhinitis 02/26/2015  . Osteoporosis 11/26/2014  . COPD (chronic obstructive pulmonary disease) ([place]) 11/26/2014  . Fuchs' corneal dystrophy 11/26/2014  . Benign hypertensive renal disease 11/26/2014  . Hyperlipidemia 11/26/2014  . Chronic kidney disease, stage III (moderate) ([place]) 11/26/2014  . Hypothyroidism 11/26/2014  . Endothelial corneal dystrophy 11/26/2014    Jerl Mina ,PT, DPT, E-RYT  04/12/2019, 3:53 PM  [place] MAIN [facility] SERVICES [address] [place], [place], [address] Phone: [phone]   Fax:  [phone]  Name: Julia Deleon MRN: [id number] Date of Birth: 02-13-1946

## 2019-04-12 NOTE — Patient Instructions (Signed)
Remove the shoe lift and stop wearing

## 2019-04-16 ENCOUNTER — Ambulatory Visit: Payer: Medicare Other | Admitting: Physical Therapy

## 2019-04-16 ENCOUNTER — Other Ambulatory Visit: Payer: Self-pay

## 2019-04-16 DIAGNOSIS — R293 Abnormal posture: Secondary | ICD-10-CM

## 2019-04-16 DIAGNOSIS — M62838 Other muscle spasm: Secondary | ICD-10-CM

## 2019-04-16 NOTE — Patient Instructions (Signed)
Knee to chest stretch on both sides 5 reps    ___   Letter T, [place] on one leg with band Hand on doorframe Plant [place], toes spread, thigh out against band,, tracking knee in line with 2-3rd toe line  Dipping forward, tap [place] of R foot back   10 x 2  Both    _____   When you are doing leg lift with trainer:  dont lift foot as high because you want to maintain the front of the hips pointing forward, not twist in spine  Go slow   When lifting weights from seated position,  press feet into the ground ,use opp hand pressing down on chair  Exhale to lift,    Do not lift SIDE LATERAL as high, only 30 deg from hips

## 2019-04-16 NOTE — Therapy (Addendum)
[place] MAIN [facility] SERVICES [address] [place], [place], [address] Phone: [phone]   Fax:  [phone]  Physical Therapy Treatment  Patient Details  Name: Julia Deleon MRN: [id number] Date of Birth: 1946/02/29 Referring Provider (PT): Dr. Maryan Puls   Encounter Date: 04/16/2019  PT End of Session - 04/16/19 1611    Visit Number  40    Number of Visits  34    Date for PT Re-Evaluation  05/15/19    Authorization Type  Medicare    Authorization Time Period  05/15/2019    Authorization - Visit Number  4   last progress note submitted 03/19/19, next one #44   Authorization - Number of Visits  20    PT Start Time  1310    PT Stop Time  1410    PT Time Calculation (min)  60 min    Activity Tolerance  Patient tolerated treatment well;No increased pain    Behavior During Therapy  WFL for tasks assessed/performed       Past Medical History:  Diagnosis Date  . Arthritis   . COPD (chronic obstructive pulmonary disease) ([place])   . Family history of adverse reaction to anesthesia    father was slow to wake up  . Fuch's endothelial dystrophy   . Hyperlipidemia   . Hypothyroidism   . PONV (postoperative nausea and vomiting)    slow to wake up and PONV    Past Surgical History:  Procedure Laterality Date  . APPENDECTOMY    . BACK SURGERY  09/26/2018   L4-5 PLIF by Dr. Arnoldo Morale  . CARDIAC CATHETERIZATION    . CHOLECYSTECTOMY    . EYE SURGERY    . FOOT SURGERY Right    pin removed left  . laser vein surgery    . NASAL SINUS SURGERY    . RIGHT/LEFT HEART CATH AND CORONARY ANGIOGRAPHY N/A 12/06/2017   Procedure: RIGHT/LEFT HEART CATH AND CORONARY ANGIOGRAPHY;  Surgeon: Yolonda Kida, MD;  Location: [place] [facility];  Service: Cardiovascular;  Laterality: N/A;  . TONSILLECTOMY      There were no vitals filed for this visit.  Subjective Assessment - 04/16/19 1312    Subjective  Pt reported she felt better after last session  till she went to bed. The pain went down to an 8/10. The following day, the pain was a little less. Today the pain is came up upon waking and doing errands. She feels she can hardly walk on it. Pt had not done the single deadlift at all after the session on Thurs and  pt's  trainer has told her to stop doing the single leg deadlift because the leg was hurting her badly. On Sat she worked with her trainer and the next morning pain felt like it did the day before.    Limitations  Sitting;Walking;Standing         [facility] PT Assessment - 04/16/19 1650      Observation/Other Assessments   Observations  upright posture.       Other:   Other/ Comments  poor scapular retraction/depression in hip ext propped on forearms, lifting dumbbells in bicep curls with breathholding. shoulder abduction 5 lb weight seated with poor scapular retraction/ depression       PROM   Overall PROM Comments  L knee 30 deg flexion       Palpation   SI assessment   iliac crest levelled in barefeet in standing, supine, L ASIS /  malleoli higher ( post Tx: supine, ASIS / malleoli more levelled)     Palpation comment  tightness at medial ischial tuberosity L       Bed Mobility   Bed Mobility  --   head crunch                  [facility] Adult PT Treatment/Exercise - 04/16/19 1650      Therapeutic Activites    Other Therapeutic Activities  assessed pt performing the exercises she does with fitness trainer   provided explanation on coorect alignment     Neuro Re-ed    Neuro Re-ed Details   cued for alignment of knee, hips, feet in SLS, single leg deadlift, explained the drderole of tightness of IT band with poor alignment and technique        Manual Therapy   Manual therapy comments  STM/MWM at medial ischial tuberosity, distraction in hip abd/ER direction L , AP mob Grade II on  tib plateau to promote ext ,                 PT Short Term Goals - 03/23/19 1221      PT SHORT TERM GOAL #1   Title   Patient will demonstrate improved pelvic alignment and balance of musculature surrounding the pelvis to facilitate decreased PFM spasms and decrease pelvic pain.    Baseline  Pt. demonstrates severe posterior pelvic tilt, L thoracic/R lumbar scoliosis, and R anterior/L posterior pelvic obliquity. Pt. has demonstrated improved pelvic alignment and posture but has not been able to sustain long-term due to continued lack of postural strength.    Time  5    Period  Weeks    Status  Achieved    Target Date  03/06/19      PT SHORT TERM GOAL #2   Title  Patient will demonstrate a coordinated contraction, relaxation, and bulge of the pelvic floor muscles to demonstrate functional recruitment and motion and allow for bladder emptying.    Baseline  Pt. is unable to fully empty bladder and describes pain with catheterization, demonstrating high tone of PFM.    Time  5    Period  Weeks    Status  Achieved    Target Date  03/06/19      PT SHORT TERM GOAL #3   Title  Patient will demonstrate improved sitting and standing posture to demonstrate learning and decrease stress on the pelvic floor with functional activity.    Baseline  Pt. demonstrates severe posterior pelvic tilt, L thoracic/R lumbar scoliosis, and R anterior/L posterior pelvic obliquity. 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 Will be able to cath without pain and will be cleared by physiscian to d/c self-cath due to decreased residual volume with bladder emptying.    Baseline  required to cath 3 times per day, painful each time.    Time  10    Period  Weeks    Status  Achieved      PT LONG TERM GOAL #4   Title  Patient will score less than or equal to 40% on the Female NIH-CPSI and 30% on the [facility] to demonstrate a reduction in pain, urinary symptoms, and an improved quality of life.    Baseline  Female NIH-CPSI: 38/43 (88%) , PDI: 45/75 (60%); PDI 44/75 (59%) and Female NIH-CPSI: 27/43 (64%) on 02-08-19 , Female NIH-CPSI: 17% on 03-26-19    Time  10    Period  Weeks    Status  Achieved      PT LONG TERM GOAL #5   Title  Patient will demonstrate increased step length with reciprical arm swing, and demonstrate no scissoring steps.    Baseline  Pt currently demonstrated scissoring steppage on R LE, increased trunk rotation and deep core engagement with reciprical walking poles.    Time  10    Period  Weeks    Status  Achieved      PT LONG TERM GOAL #6   Title  Pt will report no radiating pain down LLE after walking treadmill for 10 min in order to progress with aerobic exercises    Baseline  after 7 min    Time  8    Period  Weeks    Status  On-going    Target Date  06/04/19      PT LONG TERM GOAL #7   Title  Pt will report walking up > 2 mornings in a row with pain level < 5-6/10 in order to demo maintanence of Tx benefits and more balance of mm tensions achieved with HEP, shoe lift to improve QOL    Baseline  7-8/10 level and benefits from Tx lasted only one morning  (03-19-19 and 03-23-19)    Time  10    Period  Weeks     Status  Partially Met      PT LONG TERM GOAL #8   Title  Pt will be able to demo increased PF strength with unilateral UE support on wall from B 3/5 15 reps in order to achieve stronger push off in gait and walk longer distance    Baseline  B, L 7 reps  2/5, 10 reps R 3/5    Time  10    Period  Weeks    Status  New    Target Date  06/04/19            Plan - 04/16/19 1621    Clinical Impression Statement Pt felt better after last session but the relief did not last beyond the morning. Pt showed equal iliac crest alignment in standing without  shoe lift but showed higher iliac crest and malleoli on L when in supine. Knee flexion on L in standing and supine is being addressed with manual Tx today. Poor technique in fitness exercises she performs with fitness trainer is also contributing to her c/o L IT band tightness and medial knee pain. Demonstrated better propioception and technique post Tx. Pt explained knee flexion was not present prior to sciatic issues that persisted for the past 5 years. Pt's toe abduction and intrinsic feet mm are improving and anticipate lower kinetic chain will help improve her L thigh/ knee issues.  Scoliosis/ trunk rotation are significantly less prominent.   Pt will not be going to her fitness training sessions this week which will help to eliminate variables to the effects of PT sessions. Plan to continue to apply manual Tx to address posterior leg mm and increase knee extension. Plan to educate and implement flexibility program at next session to yield longer lasting changes. Suspect that this is a missing component that will yield longer lasting effects because  pt did not have a proper flexibility program when she worked with fitness trainer   Pt continues to benefit from skilled PT.     Personal Factors and Comorbidities  Age;Comorbidity 3+    Comorbidities  Osteoporosis, scoliosis, Arthritis, COPD, recent lumbosacral fusion for spondylolisthesis.     Examination-Activity Limitations  Toileting;Sit;Stand;Bend;Lift;Carry    Examination-Participation Restrictions  Interpersonal Relationship;Yard Work;Cleaning;Laundry    Stability/Clinical Decision Making  Unstable/Unpredictable    Rehab Potential  Good    PT Frequency  2x / week    PT Duration  Other (comment)   20   PT Treatment/Interventions  ADLs/Self Care Home Management;Aquatic Therapy;Moist Heat;Electrical Stimulation;Traction;Therapeutic activities;Functional mobility training;Stair training;Gait training;Therapeutic exercise;Balance training;Neuromuscular re-education;Patient/family education;Manual techniques;Dry needling;Passive range of motion;Scar mobilization;Taping    Consulted and Agree with Plan of Care  Patient       Patient will benefit from skilled therapeutic intervention in order to improve the following deficits and impairments:  Abnormal gait, Decreased balance, Increased muscle spasms, Decreased range of motion, Decreased scar mobility, Improper body mechanics, Decreased coordination, Decreased strength, Increased fascial restricitons, Impaired flexibility, Postural dysfunction, Pain  Visit Diagnosis: Abnormal posture  Other muscle spasm     Problem List Patient Active Problem List   Diagnosis Date Noted  . Chronic UTI 12/06/2018  . Status post lumbar surgery 12/06/2018  . Spondylolisthesis, lumbar region 09/26/2018  . Aortic atherosclerosis ([place]) 08/31/2018  . CAD (coronary artery disease) 02/23/2018  . Advanced care planning/counseling discussion 03/28/2017  . Varicose veins of both lower extremities with complications 69/48/5462  . Arthritis 12/09/2016  . Vaginal atrophy 09/10/2015  . Menopausal hot flushes 09/10/2015  . Onychomycosis due to dermatophyte 04/07/2015  . Bursitis of right shoulder 02/26/2015  . Environmental and seasonal allergies 02/26/2015  . Other allergic rhinitis 02/26/2015  . Osteoporosis 11/26/2014  . COPD (chronic obstructive  pulmonary disease) ([place]) 11/26/2014  . Fuchs' corneal dystrophy 11/26/2014  . Benign hypertensive renal disease 11/26/2014  . Hyperlipidemia 11/26/2014  . Chronic kidney disease, stage III (moderate) ([place]) 11/26/2014  . Hypothyroidism 11/26/2014  . Endothelial corneal dystrophy 11/26/2014    Jerl Mina ,PT, DPT, E-RYT  04/16/2019, 5:01 PM  [place] MAIN [facility] SERVICES [address] [place], [place], [address] Phone: [phone]   Fax:  [phone]  Name: Daneesha C Lahey MRN: [id number] Date of Birth: 30-May-1945

## 2019-04-18 ENCOUNTER — Ambulatory Visit: Payer: Medicare Other | Admitting: Physical Therapy

## 2019-04-18 ENCOUNTER — Other Ambulatory Visit: Payer: Self-pay

## 2019-04-18 DIAGNOSIS — R293 Abnormal posture: Secondary | ICD-10-CM

## 2019-04-18 DIAGNOSIS — M62838 Other muscle spasm: Secondary | ICD-10-CM

## 2019-04-18 NOTE — Patient Instructions (Addendum)
Remove the teeter totter exercise   To loose your tailbone and strengthen your hip abductor muscles to decrease tightness on ITBAND     1) Frog stretch: laying on belly , shoulders and head down flat, knees bent, inhale do nothing, exhale let ankles fall apart  (30 rep)     2) Clam Shell 45 Degrees   Lying with hips and knees bent 45, one pillow between knees and ankles. Lift knee with exhale. Be sure pelvis does not roll backward. Do not arch back. Do 20 reps     Complimentary stretch: Figure-4    ___  Rest breaks between errands,. Let your muscles relax

## 2019-04-18 NOTE — Therapy (Signed)
[place] MAIN [facility] SERVICES [address] [place], [place], [address] Phone: [phone]   Fax:  [phone]  Physical Therapy Treatment  Patient Details  Name: Julia Deleon MRN: [id number] Date of Birth: 01-17-1946 Referring Provider (PT): Dr. Maryan Puls   Encounter Date: 04/18/2019  PT End of Session - 04/18/19 1314    Visit Number  41    Number of Visits  49    Date for PT Re-Evaluation  05/15/19    Authorization Type  Medicare    Authorization Time Period  05/15/2019    Authorization - Visit Number  5   last progress note submitted 03/19/19, next one #44   Authorization - Number of Visits  20    PT Start Time  1205    PT Stop Time  1310    PT Time Calculation (min)  65 min    Activity Tolerance  Patient tolerated treatment well;No increased pain    Behavior During Therapy  WFL for tasks assessed/performed       Past Medical History:  Diagnosis Date  . Arthritis   . COPD (chronic obstructive pulmonary disease) ([place])   . Family history of adverse reaction to anesthesia    father was slow to wake up  . Fuch's endothelial dystrophy   . Hyperlipidemia   . Hypothyroidism   . PONV (postoperative nausea and vomiting)    slow to wake up and PONV    Past Surgical History:  Procedure Laterality Date  . APPENDECTOMY    . BACK SURGERY  09/26/2018   L4-5 PLIF by Dr. Arnoldo Morale  . CARDIAC CATHETERIZATION    . CHOLECYSTECTOMY    . EYE SURGERY    . FOOT SURGERY Right    pin removed left  . laser vein surgery    . NASAL SINUS SURGERY    . RIGHT/LEFT HEART CATH AND CORONARY ANGIOGRAPHY N/A 12/06/2017   Procedure: RIGHT/LEFT HEART CATH AND CORONARY ANGIOGRAPHY;  Surgeon: Yolonda Kida, MD;  Location: [facility];  Service: Cardiovascular;  Laterality: N/A;  . TONSILLECTOMY      There were no vitals filed for this visit.  Subjective Assessment - 04/18/19 1212    Subjective  Pt reported she felt better after last session  with the pain going back above the knee. The pain went back down the knee the morning after and that is where it is today.  The [employer] exercise causes lots of pain and pt stopped doing them.    Limitations  Sitting;Walking;Standing         [facility] PT Assessment - 04/18/19 1218      AROM   Overall AROM Comments  hip ext standing: 12 deg L hip, 25 deg R ( pre Tx),  hip ext L 25 deg ( post Tx)       PROM   Overall PROM Comments         Palpation   SI assessment   iliac crest levelled in barefeet in standing, supine,     Palpation comment  tightness at glut med/minimus L , distal QL L, coccygeus/ sacrococcygeus ligament L        Bed Mobility   Rolling Right  --   no c/o cramping side thigh L, but pain lateral/medial knee               Pelvic Floor Special Questions - 04/18/19 0001    External Palpation  no tightness at obt int L  [facility] PT Treatment/Exercise - 04/18/19 1218      Modalities   Modalities  Moist Heat      Moist Heat Therapy   Number Minutes Moist Heat  5 Minutes    Moist Heat Location  --   sacrum      Manual Therapy   Manual therapy comments  STM/MWM  glut med/ minimus L , distal QL L, sacrococcygeus ligament/coccygeus L, superior glide of sacrum and coccyx                 PT Short Term Goals - 03/23/19 1221      PT SHORT TERM GOAL #1   Title  Patient will demonstrate improved pelvic alignment and balance of musculature surrounding the pelvis to facilitate decreased PFM spasms and decrease pelvic pain.    Baseline  Pt. demonstrates severe posterior pelvic tilt, L thoracic/R lumbar scoliosis, and R anterior/L posterior pelvic obliquity. Pt. has demonstrated improved pelvic alignment and posture but has not been able to sustain long-term due to continued lack of postural strength.    Time  5    Period  Weeks    Status  Achieved    Target Date  03/06/19      PT SHORT TERM GOAL #2   Title  Patient will demonstrate a  coordinated contraction, relaxation, and bulge of the pelvic floor muscles to demonstrate functional recruitment and motion and allow for bladder emptying.    Baseline  Pt. is unable to fully empty bladder and describes pain with catheterization, demonstrating high tone of PFM.    Time  5    Period  Weeks    Status  Achieved    Target Date  03/06/19      PT SHORT TERM GOAL #3   Title  Patient will demonstrate improved sitting and standing posture to demonstrate learning and decrease stress on the pelvic floor with functional activity.    Baseline  Pt. demonstrates severe posterior pelvic tilt, L thoracic/R lumbar scoliosis, and R anterior/L posterior pelvic obliquity. 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 Will be able to cath without pain and will  be cleared by physiscian to d/c self-cath due to decreased residual volume with bladder emptying.    Baseline  required to cath 3 times per day, painful each time.    Time  10    Period  Weeks    Status  Achieved      PT LONG TERM GOAL #4   Title   Patient will score less than or equal to 40% on the Female NIH-CPSI and 30% on the [facility] to demonstrate a reduction in pain, urinary symptoms, and an improved quality of life.    Baseline  Female NIH-CPSI: 38/43 (88%) , PDI: 45/75 (60%); PDI 44/75 (59%) and Female NIH-CPSI: 27/43 (64%) on 02-08-19 , Female NIH-CPSI: 17% on 03-26-19    Time  10    Period  Weeks    Status  Achieved      PT LONG TERM GOAL #5   Title  Patient will demonstrate increased step length with reciprical arm swing, and demonstrate no scissoring steps.    Baseline  Pt currently demonstrated scissoring steppage on R LE, increased trunk rotation and deep core engagement with reciprical walking poles.    Time  10    Period  Weeks    Status  Achieved      PT LONG TERM GOAL #6   Title  Pt will report no radiating pain down LLE after walking treadmill for 10 min in order to progress with aerobic exercises    Baseline  after 7 min    Time  8    Period  Weeks    Status  On-going    Target Date  06/04/19      PT LONG TERM GOAL #7   Title  Pt will report walking up > 2 mornings in a row with pain level < 5-6/10 in order to demo maintanence of Tx benefits and more balance of mm tensions achieved with HEP, shoe lift to improve QOL    Baseline  7-8/10 level and benefits from Tx lasted only one morning  (03-19-19 and 03-23-19)    Time  10    Period  Weeks    Status  Partially Met      PT LONG TERM GOAL #8   Title  Pt will be able to demo increased PF strength with unilateral UE support on wall from B 3/5 15 reps in order to achieve stronger push off in gait and walk longer distance    Baseline  B, L 7 reps  2/5, 10 reps R 3/5    Time  10    Period  Weeks    Status  New    Target Date  06/04/19            Plan - 04/18/19 1314    Clinical Impression Statement  Pt demonstrates good carry over with equal alignment of iliac crest and no obliquities in supine and standing across past 2 sessions along with no more tightness  long lateral thigh and leg and more toe abduction in B feet. Her gait has more reciprocal rotation of pelvis but still shows slight R trunk lean which is related to her scoliosis, limited L knee ext, past L foot issues.   Focused on increasing hip ext on L today which increased from 12 deg to 25 deg post Tx that worked on decreasing tightness at coccygeus and sacrococcygeus ligment, glut med/ mini L. Pt reported L radiating pain changed from below knee to above knee post Tx.  Added hip  abductior strengthening today B as pt demo'd weakness.   Plan to add multifidis strengthening to next session given pt's Hx of lumbar surgery and anticipate this will help stabilize pelvic girdle along with the strengtehning of hip abductors.   Pt continues to benefit from skilled PT.     Personal Factors and Comorbidities  Age;Comorbidity 3+    Comorbidities  Osteoporosis, scoliosis, Arthritis, COPD, recent lumbosacral fusion for spondylolisthesis.    Examination-Activity Limitations  Toileting;Sit;Stand;Bend;Lift;Carry    Examination-Participation Restrictions  Interpersonal Relationship;Yard Work;Cleaning;Laundry    Stability/Clinical Decision Making  Unstable/Unpredictable    Rehab Potential  Good    PT Frequency  2x / week    PT Duration  Other (comment)   20   PT Treatment/Interventions  ADLs/Self Care Home Management;Aquatic Therapy;Moist Heat;Electrical Stimulation;Traction;Therapeutic activities;Functional mobility training;Stair training;Gait training;Therapeutic exercise;Balance training;Neuromuscular re-education;Patient/family education;Manual techniques;Dry needling;Passive range of motion;Scar mobilization;Taping    Consulted and Agree with Plan of Care  Patient       Patient will benefit from skilled therapeutic intervention in order to improve the following deficits and impairments:  Abnormal gait, Decreased balance, Increased muscle spasms, Decreased range of motion, Decreased scar mobility,  Improper body mechanics, Decreased coordination, Decreased strength, Increased fascial restricitons, Impaired flexibility, Postural dysfunction, Pain  Visit Diagnosis: Abnormal posture  Other muscle spasm     Problem List Patient Active Problem List   Diagnosis Date Noted  . Chronic UTI 12/06/2018  . Status post lumbar surgery 12/06/2018  . Spondylolisthesis, lumbar region 09/26/2018  . Aortic atherosclerosis ([place]) 08/31/2018  . CAD (coronary artery disease) 02/23/2018  . Advanced care planning/counseling discussion 03/28/2017  . Varicose veins of both lower extremities with complications 60/63/0160  . Arthritis 12/09/2016  . Vaginal atrophy 09/10/2015  . Menopausal hot flushes 09/10/2015  . Onychomycosis due to dermatophyte 04/07/2015  . Bursitis of right shoulder 02/26/2015  . Environmental and seasonal allergies 02/26/2015  . Other allergic rhinitis 02/26/2015  . Osteoporosis 11/26/2014  . COPD (chronic obstructive pulmonary disease) ([place]) 11/26/2014  . Fuchs' corneal dystrophy 11/26/2014  . Benign hypertensive renal disease 11/26/2014  . Hyperlipidemia 11/26/2014  . Chronic kidney disease, stage III (moderate) ([place]) 11/26/2014  . Hypothyroidism 11/26/2014  . Endothelial corneal dystrophy 11/26/2014    Jerl Mina  ,PT, DPT, E-RYT  04/18/2019, 1:16 PM  [place] MAIN [facility] SERVICES [address] [place], [place], [address] Phone: [phone]   Fax:  [phone]  Name: Julia Deleon MRN: [id number] Date of Birth: 11/03/1945

## 2019-04-19 DIAGNOSIS — J301 Allergic rhinitis due to pollen: Secondary | ICD-10-CM | POA: Diagnosis not present

## 2019-04-23 ENCOUNTER — Other Ambulatory Visit: Payer: Self-pay

## 2019-04-23 ENCOUNTER — Ambulatory Visit (INDEPENDENT_AMBULATORY_CARE_PROVIDER_SITE_OTHER): Payer: Medicare Other | Admitting: Family Medicine

## 2019-04-23 ENCOUNTER — Telehealth: Payer: Self-pay

## 2019-04-23 ENCOUNTER — Ambulatory Visit: Payer: Medicare Other | Admitting: Physical Therapy

## 2019-04-23 ENCOUNTER — Encounter: Payer: Self-pay | Admitting: Family Medicine

## 2019-04-23 VITALS — BP 125/70 | HR 60 | Temp 98.1°F

## 2019-04-23 DIAGNOSIS — I2583 Coronary atherosclerosis due to lipid rich plaque: Secondary | ICD-10-CM

## 2019-04-23 DIAGNOSIS — I2510 Atherosclerotic heart disease of native coronary artery without angina pectoris: Secondary | ICD-10-CM

## 2019-04-23 DIAGNOSIS — M62838 Other muscle spasm: Secondary | ICD-10-CM

## 2019-04-23 DIAGNOSIS — R293 Abnormal posture: Secondary | ICD-10-CM

## 2019-04-23 DIAGNOSIS — R300 Dysuria: Secondary | ICD-10-CM

## 2019-04-23 DIAGNOSIS — N390 Urinary tract infection, site not specified: Secondary | ICD-10-CM

## 2019-04-23 MED ORDER — NITROFURANTOIN MONOHYD MACRO 100 MG PO CAPS: 100.0000 mg | ORAL_CAPSULE | Freq: Two times a day (BID) | ORAL | 0 refills | Status: DC

## 2019-04-23 NOTE — Patient Instructions (Addendum)
Stop clams and frog stretch   _______  Feet slides :  Points of contact at sitting bones  Four points of contact of foot, Heel up, ankle not twist out Lower heel Four points of contact of foot, Slide foot back  Repeated with other foot    ______ Standing with pedicure spacers,  Push through ballmounds, spread toes, to straighten L knee  20 reps

## 2019-04-23 NOTE — Telephone Encounter (Signed)
Patient was seen in office.  Copied from [place] [phone]. Topic: General - Inquiry >> Apr 23, 2019  9:12 AM Julia Deleon, NT wrote: Reason for CRM: Patient called in stating she would like a call back from Dr.Johnson to discuss the urine sample for possible UTI. Please advise.

## 2019-04-23 NOTE — Progress Notes (Signed)
BP 125/70   Pulse 60   Temp 98.1 F (36.7 C)   LMP  (LMP Unknown)   SpO2 98%    Subjective:    Patient ID: Julia Deleon, female    DOB: 04-Dec-1945, 73 y.o.   MRN: [id number]  HPI: Julia Deleon is a 73 y.o. female  Chief Complaint  Patient presents with  . Urinary Tract Infection   URINARY SYMPTOMS Duration: 1 day Dysuria: yes Urinary frequency: yes Urgency: yes Small volume voids: yes Symptom severity: moderate Urinary incontinence: no Foul odor: yes Hematuria: no Abdominal pain: no Back pain: yes Suprapubic pain/pressure: yes Flank pain: no Fever:  no Vomiting: no Relief with cranberry juice: N/A Relief with pyridium: N/A Status: worse Previous urinary tract infection: yes Recurrent urinary tract infection: no Vaginal discharge: no Treatments attempted: pyridium, cranberry and increasing fluids    Relevant past medical, surgical, family and social history reviewed and updated as indicated. Interim medical history since our last visit reviewed. Allergies and medications reviewed and updated.  Review of Systems  Constitutional: Negative.   Respiratory: Negative.   Cardiovascular: Negative.   Genitourinary: Positive for dysuria, frequency and urgency. Negative for decreased urine volume, difficulty urinating, dyspareunia, enuresis, flank pain, genital sores, hematuria, menstrual problem, pelvic pain, vaginal bleeding, vaginal discharge and vaginal pain.  Musculoskeletal: Negative.   Skin: Negative.   Neurological: Negative.   Psychiatric/Behavioral: Negative.     Per HPI unless specifically indicated above     Objective:    BP 125/70   Pulse 60   Temp 98.1 F (36.7 C)   LMP  (LMP Unknown)   SpO2 98%   Wt Readings from Last 3 Encounters:  04/03/19 158 lb (71.7 kg)  11/13/18 160 lb (72.6 kg)  09/30/18 159 lb 6.3 oz (72.3 kg)    Physical Exam Vitals and nursing note reviewed.  Constitutional:      General: She is not in acute distress.  Appearance: Normal appearance. She is not ill-appearing, toxic-appearing or diaphoretic.  HENT:     Head: Normocephalic and atraumatic.     Right Ear: External ear normal.     Left Ear: External ear normal.     Nose: Nose normal.     Mouth/Throat:     Mouth: Mucous membranes are moist.     Pharynx: Oropharynx is clear.  Eyes:     General: No scleral icterus.       Right eye: No discharge.        Left eye: No discharge.     Extraocular Movements: Extraocular movements intact.     Conjunctiva/sclera: Conjunctivae normal.     Pupils: Pupils are equal, round, and reactive to light.  Cardiovascular:     Rate and Rhythm: Normal rate and regular rhythm.     Pulses: Normal pulses.     Heart sounds: Normal heart sounds. No murmur. No friction rub. No gallop.   Pulmonary:     Effort: Pulmonary effort is normal. No respiratory distress.     Breath sounds: Normal breath sounds. No stridor. No wheezing, rhonchi or rales.  Chest:     Chest wall: No tenderness.  Musculoskeletal:        General: Normal range of motion.     Cervical back: Normal range of motion and neck supple.  Skin:    General: Skin is warm and dry.     Capillary Refill: Capillary refill takes less than 2 seconds.     Coloration: Skin is not jaundiced  or pale.     Findings: No bruising, erythema, lesion or rash.  Neurological:     General: No focal deficit present.     Mental Status: She is alert and oriented to person, place, and time. Mental status is at baseline.  Psychiatric:        Mood and Affect: Mood normal.        Behavior: Behavior normal.        Thought Content: Thought content normal.        Judgment: Judgment normal.     Results for orders placed or performed in visit on 04/03/19  Microscopic Examination   URINE  Result Value Ref Range   WBC, UA 6-10 (A) 0 - 5 /hpf   RBC None seen 0 - 2 /hpf   Epithelial Cells (non renal) 0-10 0 - 10 /hpf   Bacteria, UA Many (A) None seen/Few  Urine Culture, Reflex     URINE  Result Value Ref Range   Urine Culture, Routine Final report (A)    Organism ID, Bacteria Escherichia coli (A)    Antimicrobial Susceptibility Comment   CBC with Differential OUT  Result Value Ref Range   WBC 6.8 3.4 - 10.8 x10E3/uL   RBC 4.31 3.77 - 5.28 x10E6/uL   Hemoglobin 13.3 11.1 - 15.9 g/dL   Hematocrit 41.4 34.0 - 46.6 %   MCV 96 79 - 97 fL   MCH 30.9 26.6 - 33.0 pg   MCHC 32.1 31.5 - 35.7 g/dL   RDW 12.8 11.7 - 15.4 %   Platelets 209 150 - 450 x10E3/uL   Neutrophils 57 Not Estab. %   Lymphs 30 Not Estab. %   Monocytes 8 Not Estab. %   Eos 3 Not Estab. %   Basos 1 Not Estab. %   Neutrophils Absolute 3.9 1.4 - 7.0 x10E3/uL   Lymphocytes Absolute 2.0 0.7 - 3.1 x10E3/uL   Monocytes Absolute 0.5 0.1 - 0.9 x10E3/uL   EOS (ABSOLUTE) 0.2 0.0 - 0.4 x10E3/uL   Basophils Absolute 0.1 0.0 - 0.2 x10E3/uL   Immature Granulocytes 1 Not Estab. %   Immature Grans (Abs) 0.1 0.0 - 0.1 x10E3/uL  Comp Met (CMET)  Result Value Ref Range   Glucose 88 65 - 99 mg/dL   BUN 23 8 - 27 mg/dL   Creatinine, Ser 1.07 (H) 0.57 - 1.00 mg/dL   GFR calc non Af Amer 52 (L) >59 mL/min/1.73   GFR calc Af Amer 60 >59 mL/min/1.73   BUN/Creatinine Ratio 21 12 - 28   Sodium 140 134 - 144 mmol/L   Potassium 4.5 3.5 - 5.2 mmol/L   Chloride 102 96 - 106 mmol/L   CO2 22 20 - 29 mmol/L   Calcium 9.2 8.7 - 10.3 mg/dL   Total Protein 6.8 6.0 - 8.5 g/dL   Albumin 4.5 3.7 - 4.7 g/dL   Globulin, Total 2.3 1.5 - 4.5 g/dL   Albumin/Globulin Ratio 2.0 1.2 - 2.2   Bilirubin Total 0.6 0.0 - 1.2 mg/dL   Alkaline Phosphatase 55 39 - 117 IU/L   AST 17 0 - 40 IU/L   ALT 18 0 - 32 IU/L  Lipid Panel w/o Chol/HDL Ratio OUT  Result Value Ref Range   Cholesterol, Total 223 (H) 100 - 199 mg/dL   Triglycerides 150 (H) 0 - 149 mg/dL   HDL 41 >39 mg/dL   VLDL Cholesterol Cal 27 5 - 40 mg/dL   LDL Chol Calc (NIH) 155 (H) 0 -  99 mg/dL  Microalbumin, Urine Waived  Result Value Ref Range   Microalb, Ur Waived 10  0 - 19 mg/L   Creatinine, Urine Waived 100 10 - 300 mg/dL   Microalb/Creat Ratio <30 <30 mg/g  TSH  Result Value Ref Range   TSH 3.260 0.450 - 4.500 uIU/mL  UA/M w/rflx Culture, Routine   Specimen: Urine   URINE  Result Value Ref Range   Specific Gravity, UA 1.020 1.005 - 1.030   pH, UA 5.5 5.0 - 7.5   Color, UA Yellow Yellow   Appearance Ur Hazy (A) Clear   Leukocytes,UA Trace (A) Negative   Protein,UA Negative Negative/Trace   Glucose, UA Negative Negative   Ketones, UA Negative Negative   RBC, UA Negative Negative   Bilirubin, UA Negative Negative   Urobilinogen, Ur 0.2 0.2 - 1.0 mg/dL   Nitrite, UA Positive (A) Negative   Microscopic Examination See below:    Urinalysis Reflex Comment   Vit D  25 hydroxy (rtn osteoporosis monitoring)  Result Value Ref Range   Vit D, 25-Hydroxy 47.6 30.0 - 100.0 ng/mL      Assessment & Plan:   Problem List Items Addressed This Visit    None    Visit Diagnoses    Recurrent UTI    -  Primary   Will treat with nitrofurantoin. Await culture. Call with any concerns. Would like to go back to urology. Referral generated today.    Relevant Medications   nitrofurantoin, macrocrystal-monohydrate, (MACROBID) 100 MG capsule   Other Relevant Orders   Ambulatory referral to Urology   Dysuria       2+ leuks and nitrates   Relevant Orders   UA/M w/rflx Culture, Routine-H       Follow up plan: Return if symptoms worsen or fail to improve.

## 2019-04-23 NOTE — Therapy (Signed)
[place] MAIN [facility] SERVICES [address] [place], [place], [address] Phone: [phone]   Fax:  [phone]  Physical Therapy Treatment  Patient Details  Name: Julia Deleon MRN: [id number] Date of Birth: 01/24/1946 Referring Provider (PT): Dr. Maryan Puls   Encounter Date: 04/23/2019  PT End of Session - 04/23/19 1421    Visit Number  43    Number of Visits  61    Date for PT Re-Evaluation  05/15/19    Authorization Type  Medicare    Authorization Time Period  05/15/2019    Authorization - Visit Number  7   last progress note submitted 03/19/19, next one #44   Authorization - Number of Visits  20    PT Start Time  1300    PT Stop Time  1403    PT Time Calculation (min)  63 min    Activity Tolerance  Patient tolerated treatment well;No increased pain    Behavior During Therapy  WFL for tasks assessed/performed       Past Medical History:  Diagnosis Date  . Arthritis   . COPD (chronic obstructive pulmonary disease) ([place])   . Family history of adverse reaction to anesthesia    father was slow to wake up  . Fuch's endothelial dystrophy   . Hyperlipidemia   . Hypothyroidism   . PONV (postoperative nausea and vomiting)    slow to wake up and PONV    Past Surgical History:  Procedure Laterality Date  . APPENDECTOMY    . BACK SURGERY  09/26/2018   L4-5 PLIF by Dr. Arnoldo Morale  . CARDIAC CATHETERIZATION    . CHOLECYSTECTOMY    . EYE SURGERY    . FOOT SURGERY Right    pin removed left  . laser vein surgery    . NASAL SINUS SURGERY    . RIGHT/LEFT HEART CATH AND CORONARY ANGIOGRAPHY N/A 12/06/2017   Procedure: RIGHT/LEFT HEART CATH AND CORONARY ANGIOGRAPHY;  Surgeon: Yolonda Kida, MD;  Location: [facility];  Service: Cardiovascular;  Laterality: N/A;  . TONSILLECTOMY      There were no vitals filed for this visit.  Subjective Assessment - 04/23/19 1303    Subjective  Pt went to the chiropractic after leaving last  session and he adjusted her lateral meniscus and it felt better. The intense pain in her knee is now alot better and has been that way since last Wednesday.    Limitations  Sitting;Walking;Standing         [facility] PT Assessment - 04/23/19 1423      Observation/Other Assessments   Observations  able to ext knee R to 0 deg with cue to co-activate transverse arch L:       AROM   Overall AROM Comments  hip ext standing: 15 deg L , 20 deg R       Palpation   SI assessment   iliac crest on R higher than L     Palpation comment  increased tightness along adductor hallucis, plantar fascia, DAB/ PAD B, hypomobility cuneiform, navicular B       Ambulation/Gait   Gait Comments  more hip ext, more anterior rotation  on L pelvic girdle                    [facility] Adult PT Treatment/Exercise - 04/23/19 1423      Therapeutic Activites    Other Therapeutic Activities  reassessed gait, pelvic alignment, knee ext  with transverse arch co-activation      Neuro Re-ed    Neuro Re-ed Details   cued for less medial collapse, pronation, less genu valgus       Manual Therapy   Joint Mobilization  lateral glide at cuneiform,navicular      Soft tissue mobilization  STM/ MWM adductor hallucis, palnat fascia, DAB/ PAD B, hypomobility cuneiform B                PT Short Term Goals - 03/23/19 1221      PT SHORT TERM GOAL #1   Title  Patient will demonstrate improved pelvic alignment and balance of musculature surrounding the pelvis to facilitate decreased PFM spasms and decrease pelvic pain.    Baseline  Pt. demonstrates severe posterior pelvic tilt, L thoracic/R lumbar scoliosis, and R anterior/L posterior pelvic obliquity. Pt. has demonstrated improved pelvic alignment and posture but has not been able to sustain long-term due to continued lack of postural strength.    Time  5    Period  Weeks    Status  Achieved    Target Date  03/06/19      PT SHORT TERM GOAL #2   Title  Patient  will demonstrate a coordinated contraction, relaxation, and bulge of the pelvic floor muscles to demonstrate functional recruitment and motion and allow for bladder emptying.    Baseline  Pt. is unable to fully empty bladder and describes pain with catheterization, demonstrating high tone of PFM.    Time  5    Period  Weeks    Status  Achieved    Target Date  03/06/19      PT SHORT TERM GOAL #3   Title  Patient will demonstrate improved sitting and standing posture to demonstrate learning and decrease stress on the pelvic floor with functional activity.    Baseline  Pt. demonstrates severe posterior pelvic tilt, L thoracic/R lumbar scoliosis, and R anterior/L posterior pelvic obliquity. 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 Will be able to cath without pain and will be cleared by physiscian to d/c self-cath due to decreased residual volume with bladder  emptying.    Baseline  required to cath 3 times per day, painful each time.    Time  10    Period  Weeks    Status  Achieved      PT LONG  TERM GOAL #4   Title  Patient will score less than or equal to 40% on the Female NIH-CPSI and 30% on the [facility] to demonstrate a reduction in pain, urinary symptoms, and an improved quality of life.    Baseline  Female NIH-CPSI: 38/43 (88%) , PDI: 45/75 (60%); PDI 44/75 (59%) and Female NIH-CPSI: 27/43 (64%) on 02-08-19 , Female NIH-CPSI: 17% on 03-26-19    Time  10    Period  Weeks    Status  Achieved      PT LONG TERM GOAL #5   Title  Patient will demonstrate increased step length with reciprical arm swing, and demonstrate no scissoring steps.    Baseline  Pt currently demonstrated scissoring steppage on R LE, increased trunk rotation and deep core engagement with reciprical walking poles.    Time  10    Period  Weeks    Status  Achieved      PT LONG TERM GOAL #6   Title  Pt will report no radiating pain down LLE after walking treadmill for 10 min in order to progress with aerobic exercises    Baseline  after 7 min    Time  8    Period  Weeks    Status  On-going    Target Date  06/04/19      PT LONG TERM GOAL #7   Title  Pt will report walking up > 2 mornings in a row with pain level < 5-6/10 in order to demo maintanence of Tx benefits and more balance of mm tensions achieved with HEP, shoe lift to improve QOL    Baseline  7-8/10 level and benefits from Tx lasted only one morning  (03-19-19 and 03-23-19)    Time  10    Period  Weeks    Status  Partially Met      PT LONG TERM GOAL #8   Title  Pt will be able to demo increased PF strength with unilateral UE support on wall from B 3/5 15 reps in order to achieve stronger push off in gait and walk longer distance    Baseline  B, L 7 reps  2/5, 10 reps R 3/5    Time  10    Period  Weeks    Status  New    Target Date  06/04/19            Plan - 04/23/19 1401    Clinical Impression Statement Pt returned to session today with report of no more radiating pain below her knee and much knee pain relief with chiropractic Tx the afternoon  after last session.   Pt experienced no increased pain at knee nor lateral thigh after today's session. Pt showed good carry over with increased L hip extension from last session but it is not quite equal to R. This has facilitated more anterior rotation of pelvic girdle.  This limitation is related to pt's lower kinetic chain with digit II overlapping digit I. Toe abduction is improving gradually. Today, further manual Tx was applied to promote further abduction and neuromuscular re-education was applied to minimize medial collapse of longitudinal arch / genu valgus. Following manual Tx, pt demo'd increased ability to achieve knee ext with co-activation of transverse  arch which will help with pt's c/o tightness at IT band and lowered iliac crest crest on L. Plan to further strengthen instrinsic feet mm / plantar arch strength. Pt continues to benefit from skilled PT  Withheld clam shells and frog stretch due to pt' s c/o pain.    Personal Factors and Comorbidities  Age;Comorbidity 3+    Comorbidities  Osteoporosis, scoliosis, Arthritis, COPD, recent lumbosacral fusion for spondylolisthesis.    Examination-Activity Limitations  Toileting;Sit;Stand;Bend;Lift;Carry    Examination-Participation Restrictions  Interpersonal Relationship;Yard Work;Cleaning;Laundry    Stability/Clinical Decision Making  Unstable/Unpredictable    Rehab Potential  Good    PT Frequency  2x / week    PT Duration  Other (comment)   20   PT Treatment/Interventions  ADLs/Self Care Home Management;Aquatic Therapy;Moist Heat;Electrical Stimulation;Traction;Therapeutic activities;Functional mobility training;Stair training;Gait training;Therapeutic exercise;Balance training;Neuromuscular re-education;Patient/family education;Manual techniques;Dry needling;Passive range of motion;Scar mobilization;Taping    Consulted and Agree with Plan of Care  Patient       Patient will benefit from skilled therapeutic intervention in order to  improve the following deficits and impairments:  Abnormal gait, Decreased balance, Increased muscle spasms, Decreased range of motion, Decreased scar mobility, Improper body mechanics, Decreased coordination, Decreased strength, Increased fascial restricitons, Impaired flexibility, Postural dysfunction, Pain  Visit Diagnosis: Abnormal posture  Other muscle spasm     Problem List Patient Active Problem List   Diagnosis Date Noted  . Chronic UTI 12/06/2018  . Status post lumbar surgery 12/06/2018  . Spondylolisthesis, lumbar region 09/26/2018  . Aortic atherosclerosis ([place]) 08/31/2018  . CAD (coronary artery disease) 02/23/2018  . Advanced care planning/counseling discussion 03/28/2017  . Varicose veins of both lower extremities with complications 01/48/4039  . Arthritis 12/09/2016  . Vaginal atrophy 09/10/2015  . Menopausal hot flushes 09/10/2015  . Onychomycosis due to dermatophyte 04/07/2015  . Bursitis of right shoulder 02/26/2015  . Environmental and seasonal allergies 02/26/2015  . Other allergic rhinitis 02/26/2015  . Osteoporosis 11/26/2014  . COPD (chronic obstructive pulmonary disease) ([place]) 11/26/2014  . Fuchs' corneal dystrophy 11/26/2014  . Benign hypertensive renal disease 11/26/2014  . Hyperlipidemia 11/26/2014  . Chronic kidney disease, stage III (moderate) ([place]) 11/26/2014  . Hypothyroidism 11/26/2014  . Endothelial corneal dystrophy 11/26/2014    Jerl Mina ,PT, DPT, E-RYT  04/23/2019, 2:35 PM  [place] MAIN [facility] SERVICES [address] [place], [place], [address] Phone: [phone]   Fax:  [phone]  Name: Julia Deleon MRN: [id number] Date of Birth: 09-16-1945

## 2019-04-25 LAB — UA/M W/RFLX CULTURE, ROUTINE
Bilirubin, UA: NEGATIVE
Glucose, UA: NEGATIVE
Ketones, UA: NEGATIVE
Nitrite, UA: POSITIVE — AB
Protein,UA: NEGATIVE
RBC, UA: NEGATIVE
Specific Gravity, UA: 1.02 (ref 1.005–1.030)
Urobilinogen, Ur: 0.2 mg/dL (ref 0.2–1.0)
pH, UA: 5.5 (ref 5.0–7.5)

## 2019-04-25 LAB — MICROSCOPIC EXAMINATION
RBC, Urine: NONE SEEN /HPF (ref 0–2)
WBC, UA: >30 /HPF — AB (ref 0–5)

## 2019-04-25 LAB — URINE CULTURE, REFLEX

## 2019-04-26 ENCOUNTER — Other Ambulatory Visit: Payer: Self-pay

## 2019-04-26 ENCOUNTER — Ambulatory Visit: Payer: Medicare Other | Admitting: Physical Therapy

## 2019-04-26 DIAGNOSIS — R293 Abnormal posture: Secondary | ICD-10-CM

## 2019-04-26 DIAGNOSIS — M62838 Other muscle spasm: Secondary | ICD-10-CM

## 2019-04-27 NOTE — Therapy (Signed)
[place] MAIN [facility] SERVICES [address] [place], [place], [address] Phone: [phone]   Fax:  [phone]  Physical Therapy Treatment / Progress Note  Reporting from 03/19/19- 04/26/19    Patient Details  Name: Julia Deleon MRN: [id number] Date of Birth: 05-Apr-1946 Referring Provider (PT): Dr. Maryan Puls   Encounter Date: 04/26/2019  PT End of Session - 04/26/19 1306    Visit Number  44    Number of Visits  83    Date for PT Re-Evaluation 07/06/2019       Authorization Type  Medicare    Authorization Time Period  05/15/2019    Authorization - Visit Number  8   last progress note submitted 04/26/19, next one #54   Authorization - Number of Visits  requesting 20 ( 2 x week for 10 weeks)    PT Start Time  1300    PT Stop Time  1400    PT Time Calculation (min)  60 min    Activity Tolerance  Patient tolerated treatment well;No increased pain    Behavior During Therapy  WFL for tasks assessed/performed       Past Medical History:  Diagnosis Date  . Arthritis   . COPD (chronic obstructive pulmonary disease) ([place])   . Family history of adverse reaction to anesthesia    father was slow to wake up  . Fuch's endothelial dystrophy   . Hyperlipidemia   . Hypothyroidism   . PONV (postoperative nausea and vomiting)    slow to wake up and PONV    Past Surgical History:  Procedure Laterality Date  . APPENDECTOMY    . BACK SURGERY  09/26/2018   L4-5 PLIF by Dr. Arnoldo Morale  . CARDIAC CATHETERIZATION    . CHOLECYSTECTOMY    . EYE SURGERY    . FOOT SURGERY Right    pin removed left  . laser vein surgery    . NASAL SINUS SURGERY    . RIGHT/LEFT HEART CATH AND CORONARY ANGIOGRAPHY N/A 12/06/2017   Procedure: RIGHT/LEFT HEART CATH AND CORONARY ANGIOGRAPHY;  Surgeon: Yolonda Kida, MD;  Location: [facility];  Service: Cardiovascular;  Laterality: N/A;  . TONSILLECTOMY      There were no vitals filed for this  visit.  Subjective Assessment - 04/26/19 1312    Subjective  Pt reports no increased pain after last session. Pt felt exhausted.  Pt would like to review last exercise.    Limitations  Sitting;Walking;Standing         [facility] PT Assessment - 04/27/19 1233      Observation/Other Assessments   Observations  toe abduction between digit I-II more present       Posture/Postural Control   Posture Comments  standing, knee ext achieved in L with cues to co-activate more transverse arch es       Palpation   Palpation comment  tightness at peroneal longus/ brevis, semitendinosus L.  Adductor hallucis mm bulk increased B                    [facility] Adult PT Treatment/Exercise - 04/27/19 1133      Neuro Re-ed    Neuro Re-ed Details   cued for knee/ ankle to minimize pronation/ genu valgus B       Manual Therapy   Soft tissue mobilization  STM, MWM at peroneal longus/ brevis, semitendinosus L  PT Short Term Goals - 04/26/19 1307      PT SHORT TERM GOAL #1   Title  Patient will demonstrate improved pelvic alignment and balance of musculature surrounding the pelvis to facilitate decreased PFM spasms and decrease pelvic pain.    Baseline  Pt. demonstrates severe posterior pelvic tilt, L thoracic/R lumbar scoliosis, and R anterior/L posterior pelvic obliquity. Pt. has demonstrated improved pelvic alignment and posture but has not been able to sustain long-term due to continued lack of postural strength.    Time  5    Period  Weeks    Status  Achieved    Target Date  03/06/19      PT SHORT TERM GOAL #2   Title  Patient will demonstrate a coordinated contraction, relaxation, and bulge of the pelvic floor muscles to demonstrate functional recruitment and motion and allow for bladder emptying.    Baseline  Pt. is unable to fully empty bladder and describes pain with catheterization, demonstrating high tone of PFM.    Time  5    Period  Weeks    Status  Achieved     Target Date  03/06/19      PT SHORT TERM GOAL #3   Title  Patient will demonstrate improved sitting and standing posture to demonstrate learning and decrease stress on the pelvic floor with functional activity.    Baseline  Pt. demonstrates severe posterior pelvic tilt, L thoracic/R lumbar scoliosis, and R anterior/L posterior pelvic obliquity. 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 Will be able to cath without pain and will be cleared by physiscian to d/c self-cath due to decreased residual volume with bladder emptying.    Baseline  required to cath 3 times per day, painful each time.    Time  10    Period  Weeks    Status  Achieved      PT LONG TERM GOAL #4   Title  Patient will score less than or equal to 40% on the Female NIH-CPSI and 30% on the [facility] to demonstrate a reduction in pain, urinary symptoms, and an improved quality of life.  Baseline  Female NIH-CPSI: 38/43 (88%) , PDI: 45/75 (60%); PDI 44/75 (59%) and Female NIH-CPSI: 27/43 (64%) on 02-08-19 , Female NIH-CPSI: 17% on  03-26-19    Time  10    Period  Weeks    Status  Achieved      PT LONG TERM GOAL #5   Title  Patient will demonstrate increased step length with reciprical arm swing, and demonstrate no scissoring steps.    Baseline  Pt currently demonstrated scissoring steppage on R LE, increased trunk rotation and deep core engagement with reciprical walking poles.    Time  10    Period  Weeks    Status  Achieved      PT LONG TERM GOAL #6   Title  Pt will report no radiating pain down LLE after walking treadmill for 10 min in order to progress with aerobic exercises    Baseline  after 7 min    Time  8    Period  Weeks    Status  On-going      PT LONG TERM GOAL #7   Title  Pt will report walking up > 2 mornings in a row with pain level < 5-6/10 in order to demo maintanence of Tx benefits and more balance of mm tensions achieved with HEP, shoe lift to improve QOL    Baseline  7-8/10 level and benefits from Tx lasted only one morning  (03-19-19 and 03-23-19)    Time  10    Period  Weeks    Status  Partially Met      PT LONG TERM GOAL #8   Title  Pt will be able to demo increased PF strength with unilateral UE support on wall from B 3/5 15 reps in order to achieve stronger push off in gait and walk longer distance    Baseline  B, L 7 reps  2/5, 10 reps R 3/5  ( 12/17: 15 reps R , 13 reps L)    Time  10    Period  Weeks    Status  Partially Met            Plan - 04/26/19 1307    Clinical Impression Statement All STM goals have been achieved. 50% of LTG have been achieved and remaining goals are partially met.  Pt continues to have improved urination. Pt's L thigh pain remains primary focus. Scoliosis imbalances, forward head, thoracic kyphosis have all improved. Pt had a relapse with L hip/ thigh pain which was triggered by shoe lift that has since been removed as pt has equal length. Pt's remaining gait deviations ( limited hip ext, knee ext in L, slight R trunk lean) are now no longer  influenced by scoliosis curves which has been addressed with customized strengthening/ lengthening exercises. Her gait deviations and L thigh/ hip pain are influenced by her long standing L foot issues ( digit II overlapping digit I) leading to decreased weight bearing with flexed knee position/ limited hip ext as a consequence. The past session addressed increasing hip ext on L and today's session focused on minimizing hamstring/ peroneal longus/ brevis tightness which in combination with neuromuscular re-education. Pt was able to co-activate toe abduction/ transverse to gain more knee ext and pelvic anterior rotation in standing. Pt required excessive cues to minimize genu valgus and medial collapse of arches in seated position which will render pt more ready to progress to standing exercises to realign lower kinetic chain. Withheld deadlift SLS exercise last week  for this reason as pt reported increased pain because pt had not yet achieved knee ext and proper alignment of knee.  IT band is no longer tight, medial knee pain has improved significantly after her DC Tx earlier this week. Plantar/ transverse arches are getting stronger which will continue to help pt with walking with less pain and waking up with less pain. Plan to also modify fitness exercises with trainer.   Pt continues to require skilled PT to achieve remaining goals.     Personal Factors and Comorbidities  Age;Comorbidity 3+    Comorbidities  Osteoporosis, scoliosis, Arthritis, COPD, recent lumbosacral fusion for spondylolisthesis.    Examination-Activity Limitations  Toileting;Sit;Stand;Bend;Lift;Carry    Examination-Participation Restrictions  Interpersonal Relationship;Yard Work;Cleaning;Laundry    Stability/Clinical Decision Making  Unstable/Unpredictable    Rehab Potential  Good    PT Frequency  2x / week    PT Duration  Other (comment)   10   PT Treatment/Interventions  ADLs/Self Care Home Management;Aquatic Therapy;Moist  Heat;Electrical Stimulation;Traction;Therapeutic activities;Functional mobility training;Stair training;Gait training;Therapeutic exercise;Balance training;Neuromuscular re-education;Patient/family education;Manual techniques;Dry needling;Passive range of motion;Scar mobilization;Taping    Consulted and Agree with Plan of Care  Patient       Patient will benefit from skilled therapeutic intervention in order to improve the following deficits and impairments:  Abnormal gait, Decreased balance, Increased muscle spasms, Decreased range of motion, Decreased scar mobility, Improper body mechanics, Decreased coordination, Decreased strength, Increased fascial restricitons, Impaired flexibility, Postural dysfunction, Pain  Visit Diagnosis: Abnormal posture  Other muscle spasm     Problem List Patient Active Problem List   Diagnosis Date Noted  . Chronic UTI 12/06/2018  . Status post lumbar surgery 12/06/2018  . Spondylolisthesis, lumbar region 09/26/2018  . Aortic atherosclerosis ([place]) 08/31/2018  . CAD (coronary artery disease) 02/23/2018  . Advanced care planning/counseling discussion 03/28/2017  . Varicose veins of both lower extremities with complications 64/38/3779  . Arthritis 12/09/2016  . Vaginal atrophy 09/10/2015  . Menopausal hot flushes 09/10/2015  . Onychomycosis due to dermatophyte 04/07/2015  . Bursitis of right shoulder 02/26/2015  . Environmental and seasonal allergies 02/26/2015  . Other allergic rhinitis 02/26/2015  . Osteoporosis 11/26/2014  . COPD (chronic obstructive pulmonary disease) ([place]) 11/26/2014  . Fuchs' corneal dystrophy 11/26/2014  . Benign hypertensive renal disease 11/26/2014  . Hyperlipidemia 11/26/2014  . Chronic kidney disease, stage III (moderate) ([place]) 11/26/2014  . Hypothyroidism 11/26/2014  . Endothelial corneal dystrophy 11/26/2014    Jerl Mina ,PT, DPT, E-RYT  04/27/2019, 12:37 PM  [place]  MAIN [facility] SERVICES [address] [place], [place], [address] Phone: [phone]   Fax:  [phone]  Name: Earnestine C Bosket MRN: [id number] Date of Birth: [birth year]

## 2019-04-30 ENCOUNTER — Ambulatory Visit: Payer: Medicare Other | Admitting: Physical Therapy

## 2019-04-30 ENCOUNTER — Other Ambulatory Visit: Payer: Self-pay

## 2019-04-30 ENCOUNTER — Ambulatory Visit: Payer: Self-pay | Admitting: *Deleted

## 2019-04-30 DIAGNOSIS — M62838 Other muscle spasm: Secondary | ICD-10-CM

## 2019-04-30 DIAGNOSIS — R293 Abnormal posture: Secondary | ICD-10-CM

## 2019-04-30 NOTE — Patient Instructions (Signed)
Lunge position, R foot forward,  l back, bend knee, straighten knee,   heel up   10 reps  stretch

## 2019-04-30 NOTE — Telephone Encounter (Signed)
  I returned her call.    The recycling bin lid came down on her right middle finger this morning and now it has some blood under the fingernail and was bleeding around the cuticle but she got that stopped with a Band-Aid.  She can move and use it normally.    "I just wanted to call in and make sure there wasn't anything to be worried about".    I went over the home care advice with her.   She was agreeable to this and will call back if she needs to after the care advice given.   Reason for Disposition . Bruised fingernail  Answer Assessment - Initial Assessment Questions 1. MECHANISM: "How did the injury happen?"      The lid on the recycling bin slipped and hit it.    I'm moving it fine.  It started bleeding around the cuticle.   There's blood under the fingernail. 2. ONSET: "When did the injury happen?" (Minutes or hours ago)      This morning 3. LOCATION: "What part of the finger is injured?" "Is the nail damaged?"      Just around the fingernail.   It throbs. 4. APPEARANCE of the INJURY: "What does the injury look like?"      Blood under the fingernail. 5. SEVERITY: "Can you use the hand normally?"  "Can you bend your fingers into a ball and then fully open them?"     Yes 6. SIZE: For cuts, bruises, or swelling, ask: "How large is it?" (e.g., inches or centimeters;  entire finger)      bruised 7. PAIN: "Is there pain?" If so, ask: "How bad is the pain?"    (e.g., Scale 1-10; or mild, moderate, severe)     Throbs but I took something for it. 8. TETANUS: For any breaks in the skin, ask: "When was the last tetanus booster?"     Not asked 9. OTHER SYMPTOMS: "Do you have any other symptoms?"     No  10. PREGNANCY: "Is there any chance you are pregnant?" "When was your last menstrual period?"       N/A  Protocols used: FINGER INJURY-A-AH

## 2019-04-30 NOTE — Therapy (Addendum)
[place] MAIN [facility] SERVICES [address] [place], [place], [address] Phone: [phone]   Fax:  [phone]  Physical Therapy Treatment  Patient Details  Name: Julia Deleon MRN: [id number] Date of Birth: [birth year] Referring Provider (PT): Dr. Maryan Puls   Encounter Date: 04/30/2019  PT End of Session - 04/30/19 1313    Visit Number  45    Number of Visits  32    Date for PT Re-Evaluation  07/06/19    Authorization Type  Medicare    Authorization Time Period  05/15/2019    Authorization - Visit Number  9   last progress note submitted 04/26/19, next one #54   Authorization - Number of Visits  30   from last recert before Visit 57   PT Start Time  1305    PT Stop Time  1400    PT Time Calculation (min)  55 min    Activity Tolerance  Patient tolerated treatment well;No increased pain    Behavior During Therapy  WFL for tasks assessed/performed       Past Medical History:  Diagnosis Date  . Arthritis   . COPD (chronic obstructive pulmonary disease) ([place])   . Family history of adverse reaction to anesthesia    father was slow to wake up  . Fuch's endothelial dystrophy   . Hyperlipidemia   . Hypothyroidism   . PONV (postoperative nausea and vomiting)    slow to wake up and PONV    Past Surgical History:  Procedure Laterality Date  . APPENDECTOMY    . BACK SURGERY  09/26/2018   L4-5 PLIF by Dr. Arnoldo Morale  . CARDIAC CATHETERIZATION    . CHOLECYSTECTOMY    . EYE SURGERY    . FOOT SURGERY Right    pin removed left  . laser vein surgery    . NASAL SINUS SURGERY    . RIGHT/LEFT HEART CATH AND CORONARY ANGIOGRAPHY N/A 12/06/2017   Procedure: RIGHT/LEFT HEART CATH AND CORONARY ANGIOGRAPHY;  Surgeon: Yolonda Kida, MD;  Location: [place] [facility];  Service: Cardiovascular;  Laterality: N/A;  . TONSILLECTOMY      There were no vitals filed for this visit.  Subjective Assessment - 04/30/19 1313    Subjective  Pt felt  better with L thigh and knee pain after last session    Limitations  Sitting;Walking;Standing         [facility] PT Assessment - 04/30/19 1314      PROM   Overall PROM Comments  L knee 20 flexion in supine, R 10 flexion       Strength   Overall Strength Comments  L hip flex 3+/5, R 4-/5, knee ext/flex  L 3+/5, R 4-/5,      hip ext R 4+/5, L 3-/5, hip abd L 4-/5, R 3+/5      DF/EV 4-/5 B      Palpation   Palpation comment  tightness at interreosus ,  peroneal longus/ brevis L , hypomobile fib tib ( post Tx: improved)       Ambulation/Gait   Gait Comments  less trunk R lean, more reciporcal rotation of pelvic with hip ext L                    [facility] Adult PT Treatment/Exercise - 04/30/19 1314      Manual Therapy   Soft tissue mobilization  STM, MWM at peroneal longus/ brevis, interoesus. superior mob at [employer]  PT Short Term Goals - 04/26/19 1307      PT SHORT TERM GOAL #1   Title  Patient will demonstrate improved pelvic alignment and balance of musculature surrounding the pelvis to facilitate decreased PFM spasms and decrease pelvic pain.    Baseline  Pt. demonstrates severe posterior pelvic tilt, L thoracic/R lumbar scoliosis, and R anterior/L posterior pelvic obliquity. Pt. has demonstrated improved pelvic alignment and posture but has not been able to sustain long-term due to continued lack of postural strength.    Time  5    Period  Weeks    Status  Achieved    Target Date  03/06/19      PT SHORT TERM GOAL #2   Title  Patient will demonstrate a coordinated contraction, relaxation, and bulge of the pelvic floor muscles to demonstrate functional recruitment and motion and allow for bladder emptying.    Baseline  Pt. is unable to fully empty bladder and describes pain with catheterization, demonstrating high tone of PFM.    Time  5    Period  Weeks    Status  Achieved    Target Date  03/06/19      PT SHORT TERM GOAL #3   Title  Patient  will demonstrate improved sitting and standing posture to demonstrate learning and decrease stress on the pelvic floor with functional activity.    Baseline  Pt. demonstrates severe posterior pelvic tilt, L thoracic/R lumbar scoliosis, and R anterior/L posterior pelvic obliquity. 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 Will be able to cath without pain and will be cleared by physiscian to d/c self-cath due to decreased residual volume with bladder emptying.    Baseline  required to cath 3 times per day, painful each time.    Time  10    Period  Weeks    Status  Achieved      PT LONG TERM GOAL #4   Title  Patient will score less than or equal to 40% on the Female NIH-CPSI and 30% on the [facility] to demonstrate a reduction in pain, urinary symptoms, and an improved quality of life.  Baseline  Female NIH-CPSI: 38/43 (88%) , PDI: 45/75 (60%); PDI 44/75 (59%) and Female NIH-CPSI: 27/43 (64%) on 02-08-19 , Female NIH-CPSI: 17% on 03-26-19    Time  10    Period  Weeks    Status  Achieved      PT LONG  TERM GOAL #5   Title  Patient will demonstrate increased step length with reciprical arm swing, and demonstrate no scissoring steps.    Baseline  Pt currently demonstrated scissoring steppage on R LE, increased trunk rotation and deep core engagement with reciprical walking poles.    Time  10    Period  Weeks    Status  Achieved      PT LONG TERM GOAL #6   Title  Pt will report no radiating pain down LLE after walking treadmill for 10 min in order to progress with aerobic exercises    Baseline  after 7 min    Time  8    Period  Weeks    Status  On-going      PT LONG TERM GOAL #7   Title  Pt will report walking up > 2 mornings in a row with pain level < 5-6/10 in order to demo maintanence of Tx benefits and more balance of mm tensions achieved with HEP, shoe lift to improve QOL    Baseline  7-8/10 level and benefits from Tx lasted only one morning  (03-19-19 and 03-23-19)    Time  10    Period  Weeks    Status  Partially Met      PT LONG TERM GOAL #8   Title  Pt will be able to demo increased PF strength with unilateral UE support on wall from B 3/5 15 reps in order to achieve stronger push off in gait and walk longer distance    Baseline  B, L 7 reps  2/5, 10 reps R 3/5  ( 12/17: 15 reps R , 13 reps L)    Time  10    Period  Weeks    Status  Partially Met            Plan - 04/30/19 1446    Clinical Impression Statement  Pt demo'd increased knee ext from 30 deg flexn to 20 deg on L  since last session. Pt no longer showed tightness at ITBand, semitendinosus L. Focused on decreasing peronal longus//brevis, interreosus and mobilization of tib/fib to promote more DF and knee ext. Pt demo'd decreased tightness after Tx. Plan to modify fitness ex next session with recumbent biking and withhold treadmill walking because pt showed asymmetrical weakness of BLE.  Pt needs to increase R hip flexion and over LE strength before returning treadmill walking. Pt continues to benefit from skilled  PT.     Personal Factors and Comorbidities  Age;Comorbidity 3+    Comorbidities  Osteoporosis, scoliosis, Arthritis, COPD, recent lumbosacral fusion for spondylolisthesis.    Examination-Activity Limitations  Toileting;Sit;Stand;Bend;Lift;Carry    Examination-Participation Restrictions  Interpersonal Relationship;Yard Work;Cleaning;Laundry    Stability/Clinical Decision Making  Unstable/Unpredictable    Rehab Potential  Good    PT Frequency  2x / week    PT Duration  Other (comment)   20   PT Treatment/Interventions  ADLs/Self Care Home Management;Aquatic Therapy;Moist Heat;Electrical Stimulation;Traction;Therapeutic activities;Functional mobility training;Stair training;Gait training;Therapeutic exercise;Balance training;Neuromuscular re-education;Patient/family education;Manual techniques;Dry needling;Passive range of motion;Scar mobilization;Taping    Consulted and Agree with Plan of Care  Patient       Patient will benefit from  skilled therapeutic intervention in order to improve the following deficits and impairments:  Abnormal gait, Decreased balance, Increased muscle spasms, Decreased range of motion, Decreased scar mobility, Improper body mechanics, Decreased coordination, Decreased strength, Increased fascial restricitons, Impaired flexibility, Postural dysfunction, Pain  Visit Diagnosis: Abnormal posture  Other muscle spasm     Problem List Patient Active Problem List   Diagnosis Date Noted  . Chronic UTI 12/06/2018  . Status post lumbar surgery 12/06/2018  . Spondylolisthesis, lumbar region 09/26/2018  . Aortic atherosclerosis ([place]) 08/31/2018  . CAD (coronary artery disease) 02/23/2018  . Advanced care planning/counseling discussion 03/28/2017  . Varicose veins of both lower extremities with complications 01/56/1537  . Arthritis 12/09/2016  . Vaginal atrophy 09/10/2015  . Menopausal hot flushes 09/10/2015  . Onychomycosis due to dermatophyte 04/07/2015  . Bursitis  of right shoulder 02/26/2015  . Environmental and seasonal allergies 02/26/2015  . Other allergic rhinitis 02/26/2015  . Osteoporosis 11/26/2014  . COPD (chronic obstructive pulmonary disease) ([place]) 11/26/2014  . Fuchs' corneal dystrophy 11/26/2014  . Benign hypertensive renal disease 11/26/2014  . Hyperlipidemia 11/26/2014  . Chronic kidney disease, stage III (moderate) ([place]) 11/26/2014  . Hypothyroidism 11/26/2014  . Endothelial corneal dystrophy 11/26/2014    Jerl Mina ,PT, DPT, E-RYT  04/30/2019, 2:51 PM  [place] MAIN [facility] SERVICES [address] [place], [place], [address] Phone: [phone]   Fax:  [phone]  Name: Maribella C Liddy MRN: [id number] Date of Birth: [birth year]

## 2019-05-02 ENCOUNTER — Other Ambulatory Visit: Payer: Self-pay

## 2019-05-02 ENCOUNTER — Ambulatory Visit: Payer: Medicare Other | Admitting: Physical Therapy

## 2019-05-02 DIAGNOSIS — R293 Abnormal posture: Secondary | ICD-10-CM | POA: Diagnosis not present

## 2019-05-02 DIAGNOSIS — M62838 Other muscle spasm: Secondary | ICD-10-CM

## 2019-05-02 NOTE — Patient Instructions (Addendum)
Stretches : (Cuing provided for proper alignment)  Stretches for your legs: LAYING on Back Use upper arms and elbows for stability when pulling strap Opposite knee bent and foot firm in align with hip   Strap on [place]: _strap, L knee bent, R [place] against strap and spread toes, rolling foot 15 deg out and in across midline.  10 reps each side   _knee bends  10 reps  With knee pointing straight     10 reps with knee pointing out towards armpit     ___  Put socks and shoes with figure 4 position , body turned 45 deg on chair with bent knee/ leg on chair to promote more hip mobility  And decrease slumped posture to minimize risk for thoracic fracture

## 2019-05-02 NOTE — Therapy (Addendum)
[place] MAIN [facility] SERVICES [address] [place], [place], [address] Phone: [phone]   Fax:  [phone]  Physical Therapy Treatment  Patient Details  Name: Julia Deleon MRN: [id number] Date of Birth: Apr 01, 1946 Referring Provider (PT): Dr. Maryan Puls   Encounter Date: 05/02/2019  PT End of Session - 05/02/19 1020    Visit Number  50    Number of Visits  30    Date for PT Re-Evaluation  07/06/19    Authorization Type  Medicare    Authorization Time Period  05/15/2019    Authorization - Visit Number  10   last progress note submitted 04/26/19, next one #54   Authorization - Number of Visits  --   from last recert before Visit 44   PT Start Time  0903    PT Stop Time  1010    PT Time Calculation (min)  67 min    Activity Tolerance  Patient tolerated treatment well;No increased pain    Behavior During Therapy  WFL for tasks assessed/performed       Past Medical History:  Diagnosis Date  . Arthritis   . COPD (chronic obstructive pulmonary disease) ([place])   . Family history of adverse reaction to anesthesia    father was slow to wake up  . Fuch's endothelial dystrophy   . Hyperlipidemia   . Hypothyroidism   . PONV (postoperative nausea and vomiting)    slow to wake up and PONV    Past Surgical History:  Procedure Laterality Date  . APPENDECTOMY    . BACK SURGERY  09/26/2018   L4-5 PLIF by Dr. Arnoldo Morale  . CARDIAC CATHETERIZATION    . CHOLECYSTECTOMY    . EYE SURGERY    . FOOT SURGERY Right    pin removed left  . laser vein surgery    . NASAL SINUS SURGERY    . RIGHT/LEFT HEART CATH AND CORONARY ANGIOGRAPHY N/A 12/06/2017   Procedure: RIGHT/LEFT HEART CATH AND CORONARY ANGIOGRAPHY;  Surgeon: Yolonda Kida, MD;  Location: [place] [facility];  Service: Cardiovascular;  Laterality: N/A;  . TONSILLECTOMY      There were no vitals filed for this visit.  Subjective Assessment - 05/02/19 0912    Subjective  Pt report  she felt alot better after the session until 4am in bed, her whole leg hurt and she had to get up an walk.    Limitations  Sitting;Walking;Standing         [facility] PT Assessment - 05/02/19 1627      Palpation   SI assessment   equal ASIS     Palpation comment  increased tightness along extensor digitorium L / tib ant ( decreased post Tx)      Ambulation/Gait   Gait Comments  more weightbearing across metatarsals, toe extension in push off, knee extension on L in push off and stance                   [facility] Adult PT Treatment/Exercise - 05/02/19 1627      Exercises   Exercises  --   hamstring stretches/ hip mobility stretches w/ strap      Moist Heat Therapy   Number Minutes Moist Heat  5 Minutes    Moist Heat Location  --   leg      Manual Therapy   Soft tissue mobilization  STM, MWM at  tib ant, ext digitorium L  PT Short Term Goals - 04/26/19 1307      PT SHORT TERM GOAL #1   Title  Patient will demonstrate improved pelvic alignment and balance of musculature surrounding the pelvis to facilitate decreased PFM spasms and decrease pelvic pain.    Baseline  Pt. demonstrates severe posterior pelvic tilt, L thoracic/R lumbar scoliosis, and R anterior/L posterior pelvic obliquity. Pt. has demonstrated improved pelvic alignment and posture but has not been able to sustain long-term due to continued lack of postural strength.    Time  5    Period  Weeks    Status  Achieved    Target Date  03/06/19      PT SHORT TERM GOAL #2   Title  Patient will demonstrate a coordinated contraction, relaxation, and bulge of the pelvic floor muscles to demonstrate functional recruitment and motion and allow for bladder emptying.    Baseline  Pt. is unable to fully empty bladder and describes pain with catheterization, demonstrating high tone of PFM.    Time  5    Period  Weeks    Status  Achieved    Target Date  03/06/19      PT SHORT TERM GOAL #3   Title   Patient will demonstrate improved sitting and standing posture to demonstrate learning and decrease stress on the pelvic floor with functional activity.    Baseline  Pt. demonstrates severe posterior pelvic tilt, L thoracic/R lumbar scoliosis, and R anterior/L posterior pelvic obliquity. 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 Will be able to cath without pain and will be cleared by physiscian to d/c self-cath due to decreased residual volume with bladder emptying.    Baseline  required to cath 3 times per day, painful each time.    Time  10    Period  Weeks    Status  Achieved      PT LONG TERM GOAL #4   Title  Patient will score less than or equal to 40% on the Female NIH-CPSI and 30% on the [facility] to demonstrate a reduction in pain, urinary symptoms, and an improved quality of life.  Baseline  Female NIH-CPSI: 38/43 (88%) , PDI: 45/75 (60%); PDI 44/75 (59%) and Female NIH-CPSI: 27/43 (64%) on 02-08-19 , Female NIH-CPSI: 17% on 03-26-19    Time  10    Period  Weeks    Status  Achieved       PT LONG TERM GOAL #5   Title  Patient will demonstrate increased step length with reciprical arm swing, and demonstrate no scissoring steps.    Baseline  Pt currently demonstrated scissoring steppage on R LE, increased trunk rotation and deep core engagement with reciprical walking poles.    Time  10    Period  Weeks    Status  Achieved      PT LONG TERM GOAL #6   Title  Pt will report no radiating pain down LLE after walking treadmill for 10 min in order to progress with aerobic exercises    Baseline  after 7 min    Time  8    Period  Weeks    Status  On-going      PT LONG TERM GOAL #7   Title  Pt will report walking up > 2 mornings in a row with pain level < 5-6/10 in order to demo maintanence of Tx benefits and more balance of mm tensions achieved with HEP, shoe lift to improve QOL    Baseline  7-8/10 level and benefits from Tx lasted only one morning  (03-19-19 and 03-23-19)    Time  10    Period  Weeks    Status  Partially Met      PT LONG TERM GOAL #8   Title  Pt will be able to demo increased PF strength with unilateral UE support on wall from B 3/5 15 reps in order to achieve stronger push off in gait and walk longer distance    Baseline  B, L 7 reps  2/5, 10 reps R 3/5  ( 12/17: 15 reps R , 13 reps L)    Time  10    Period  Weeks    Status  Partially Met            Plan - 05/02/19 1020    Clinical Impression Statement Pt shows more weightbearing across metatarsals, L toe extension in push off, L knee extension in push off and stance today which shows continued improvements. Pt showed equal ASIS alignment which is also showing improvement as pt's knee extension is more present. Pt only required manual Tx to decrease anterior tibialis and extensor digitorium mm today. Plan to have pt replace treadmill walking with seated cycling with portable cycler for a period of time and then advance to treadmill walking as pt needs to regain bilateral strength. Asked pt to withhold  treadmill walking for now. Pt continues to benefit from skilled PT.     Personal Factors and Comorbidities  Age;Comorbidity 3+    Comorbidities  Osteoporosis, scoliosis, Arthritis, COPD, recent lumbosacral fusion for spondylolisthesis.    Examination-Activity Limitations  Toileting;Sit;Stand;Bend;Lift;Carry    Examination-Participation Restrictions  Interpersonal Relationship;Yard Work;Cleaning;Laundry    Stability/Clinical Decision Making  Unstable/Unpredictable    Rehab Potential  Good    PT Frequency  2x / week    PT Duration  Other (comment)   20   PT Treatment/Interventions  ADLs/Self Care Home Management;Aquatic Therapy;Moist Heat;Electrical Stimulation;Traction;Therapeutic activities;Functional mobility training;Stair training;Gait training;Therapeutic exercise;Balance training;Neuromuscular re-education;Patient/family education;Manual techniques;Dry needling;Passive range of motion;Scar mobilization;Taping    Consulted and Agree with Plan of Care  Patient  Patient will benefit from skilled therapeutic intervention in order to improve the following deficits and impairments:  Abnormal gait, Decreased balance, Increased muscle spasms, Decreased range of motion, Decreased scar mobility, Improper body mechanics, Decreased coordination, Decreased strength, Increased fascial restricitons, Impaired flexibility, Postural dysfunction, Pain  Visit Diagnosis: Other muscle spasm  Abnormal posture     Problem List Patient Active Problem List   Diagnosis Date Noted  . Chronic UTI 12/06/2018  . Status post lumbar surgery 12/06/2018  . Spondylolisthesis, lumbar region 09/26/2018  . Aortic atherosclerosis ([place]) 08/31/2018  . CAD (coronary artery disease) 02/23/2018  . Advanced care planning/counseling discussion 03/28/2017  . Varicose veins of both lower extremities with complications 80/08/4713  . Arthritis 12/09/2016  . Vaginal atrophy 09/10/2015  . Menopausal hot flushes 09/10/2015   . Onychomycosis due to dermatophyte 04/07/2015  . Bursitis of right shoulder 02/26/2015  . Environmental and seasonal allergies 02/26/2015  . Other allergic rhinitis 02/26/2015  . Osteoporosis 11/26/2014  . COPD (chronic obstructive pulmonary disease) ([place]) 11/26/2014  . Fuchs' corneal dystrophy 11/26/2014  . Benign hypertensive renal disease 11/26/2014  . Hyperlipidemia 11/26/2014  . Chronic kidney disease, stage III (moderate) ([place]) 11/26/2014  . Hypothyroidism 11/26/2014  . Endothelial corneal dystrophy 11/26/2014    Jerl Mina ,PT, DPT, E-RYT  05/02/2019, 4:33 PM  [place] MAIN [facility] SERVICES [address] [place], [place], [address] Phone: [phone]   Fax:  [phone]  Name: Julia Deleon MRN: [id number] Date of Birth: 13-Dec-1945

## 2019-05-03 ENCOUNTER — Telehealth: Payer: Self-pay

## 2019-05-03 NOTE — Telephone Encounter (Signed)
Patient notified that antibiotic will treat UTI. Copied from [place] [phone]. Topic: General - Other >> May 03, 2019 11:41 AM Yvette Rack wrote: Reason for CRM: Pt stated she has not heard back from anyone in regards to whether the antibiotic that she was prescribed is the correct antibiotic. Pt requests call back. Cb# [phone]

## 2019-05-07 ENCOUNTER — Ambulatory Visit (INDEPENDENT_AMBULATORY_CARE_PROVIDER_SITE_OTHER): Payer: Medicare Other | Admitting: Family Medicine

## 2019-05-07 ENCOUNTER — Encounter: Payer: Self-pay | Admitting: Family Medicine

## 2019-05-07 ENCOUNTER — Other Ambulatory Visit: Payer: Self-pay

## 2019-05-07 ENCOUNTER — Encounter: Payer: Medicare Other | Admitting: Physical Therapy

## 2019-05-07 VITALS — BP 131/66 | HR 63 | Temp 97.6°F | Ht 64.0 in | Wt 161.0 lb

## 2019-05-07 DIAGNOSIS — I2510 Atherosclerotic heart disease of native coronary artery without angina pectoris: Secondary | ICD-10-CM | POA: Diagnosis not present

## 2019-05-07 DIAGNOSIS — N390 Urinary tract infection, site not specified: Secondary | ICD-10-CM

## 2019-05-07 DIAGNOSIS — I2583 Coronary atherosclerosis due to lipid rich plaque: Secondary | ICD-10-CM | POA: Diagnosis not present

## 2019-05-07 DIAGNOSIS — K644 Residual hemorrhoidal skin tags: Secondary | ICD-10-CM

## 2019-05-07 LAB — UA/M W/RFLX CULTURE, ROUTINE
Bilirubin, UA: NEGATIVE
Glucose, UA: NEGATIVE
Ketones, UA: NEGATIVE
Leukocytes,UA: NEGATIVE
Nitrite, UA: NEGATIVE
Protein,UA: NEGATIVE
RBC, UA: NEGATIVE
Specific Gravity, UA: 1.01 (ref 1.005–1.030)
Urobilinogen, Ur: 0.2 mg/dL (ref 0.2–1.0)
pH, UA: 6 (ref 5.0–7.5)

## 2019-05-07 MED ORDER — NITROFURANTOIN MONOHYD MACRO 100 MG PO CAPS: 100.0000 mg | ORAL_CAPSULE | Freq: Two times a day (BID) | ORAL | 0 refills | Status: DC

## 2019-05-07 NOTE — Progress Notes (Signed)
BP 131/66   Pulse 63   Temp 97.6 F (36.4 C) (Oral)   Ht 5\' 4"  (1.626 m)   Wt 161 lb (73 kg)   LMP  (LMP Unknown)   SpO2 98%   BMI 27.64 kg/m    Subjective:    Patient ID: Julia Deleon, female    DOB: 03-19-1946, 73 y.o.   MRN: [id number]  HPI: Julia Deleon is a 73 y.o. female  Chief Complaint  Patient presents with  . Back Pain    symptoms started over the weekend  . Dysuria   Was seen for UTI about 2 weeks ago and treated with macrobid. Sxs started back up about 4 days ago. Found some macrobid leftover at home from a previous infection which she started taking to get her through the holiday weekend. Has taken about 4 days worth with good improvement. Since back surgery early summer has had issues with recurrent UTIs. Was having to self catheterize at home for several months following surgery. Awaiting est visit with Urology next month for further evaluation into this issue.   Feels part of her issue with recurrent UTIs is her external hemorrhoids. Has tried prep H, witch hazel, but nothing seems to be helping with this. Sometimes bleeding, typically not itching or hurting badly. Ready to see a specialist to have them removed.   Relevant past medical, surgical, family and social history reviewed and updated as indicated. Interim medical history since our last visit reviewed. Allergies and medications reviewed and updated.  Review of Systems  Per HPI unless specifically indicated above     Objective:    BP 131/66   Pulse 63   Temp 97.6 F (36.4 C) (Oral)   Ht 5\' 4"  (1.626 m)   Wt 161 lb (73 kg)   LMP  (LMP Unknown)   SpO2 98%   BMI 27.64 kg/m   Wt Readings from Last 3 Encounters:  05/07/19 161 lb (73 kg)  04/03/19 158 lb (71.7 kg)  11/13/18 160 lb (72.6 kg)    Physical Exam Vitals and nursing note reviewed.  Constitutional:      Appearance: Normal appearance. She is not ill-appearing.  HENT:     Head: Atraumatic.  Eyes:     Extraocular Movements: Extraocular  movements intact.     Conjunctiva/sclera: Conjunctivae normal.  Cardiovascular:     Rate and Rhythm: Normal rate and regular rhythm.     Heart sounds: Normal heart sounds.  Pulmonary:     Effort: Pulmonary effort is normal.     Breath sounds: Normal breath sounds.  Abdominal:     General: Bowel sounds are normal.     Palpations: Abdomen is soft.     Tenderness: There is no abdominal tenderness. There is no right CVA tenderness, left CVA tenderness or guarding.  Genitourinary:    Comments: Anorectal exam declined today Musculoskeletal:        General: Normal range of motion.     Cervical back: Normal range of motion and neck supple.  Skin:    General: Skin is warm and dry.  Neurological:     Mental Status: She is alert and oriented to person, place, and time.  Psychiatric:        Mood and Affect: Mood normal.        Thought Content: Thought content normal.        Judgment: Judgment normal.     Results for orders placed or performed in visit on 05/07/19  UA/M w/rflx Culture, Routine   Specimen: Urine   URINE  Result Value Ref Range   Specific Gravity, UA 1.010 1.005 - 1.030   pH, UA 6.0 5.0 - 7.5   Color, UA Yellow Yellow   Appearance Ur Clear Clear   Leukocytes,UA Negative Negative   Protein,UA Negative Negative/Trace   Glucose, UA Negative Negative   Ketones, UA Negative Negative   RBC, UA Negative Negative   Bilirubin, UA Negative Negative   Urobilinogen, Ur 0.2 0.2 - 1.0 mg/dL   Nitrite, UA Negative Negative      Assessment & Plan:   Problem List Items Addressed This Visit    None    Visit Diagnoses    Acute lower UTI    -  Primary   WIll continue macrobid, await cx results. To establish with local Urology next month for further management   Relevant Medications   nitrofurantoin, macrocrystal-monohydrate, (MACROBID) 100 MG capsule   Other Relevant Orders   UA/M w/rflx Culture, Routine (Completed)   External hemorrhoid       Pt will call with which Gen  Surgery provider she would like a referral to, requesting removal of hemorrhoids      25 minutes spent in direct care and counseling with patient.   Follow up plan: Return if symptoms worsen or fail to improve.

## 2019-05-08 ENCOUNTER — Ambulatory Visit: Payer: Medicare Other | Admitting: Physical Therapy

## 2019-05-08 DIAGNOSIS — R293 Abnormal posture: Secondary | ICD-10-CM | POA: Diagnosis not present

## 2019-05-08 DIAGNOSIS — M62838 Other muscle spasm: Secondary | ICD-10-CM

## 2019-05-08 NOTE — Patient Instructions (Signed)
Hands  behind back   1) Pressing dwn with the hands, shoulders squeeze back   Exhale  Push into floor  Straighten knees by pressing into ballmounds, toes spread and lifted  10 reps  X 2  2) Heels together,  pressing into ballmounds, toes spread and lifted  Exhale  Push into floor  Straighten knees by pressing into ballmounds, toes spread and lifted 10 reps  X 2

## 2019-05-08 NOTE — Therapy (Addendum)
[place] MAIN [facility] SERVICES [address] [place], [place], [address] Phone: [phone]   Fax:  [phone]  Physical Therapy Treatment  Patient Details  Name: Julia Deleon MRN: [id number] Date of Birth: 1946-01-11 Referring Provider (PT): Dr. Maryan Puls   Encounter Date: 05/08/2019  PT End of Session - 05/08/19 1559    Visit Number  70    Number of Visits  67    Date for PT Re-Evaluation  2/26//21    Authorization Type  Medicare    Authorization Time Period  05/15/2019    Authorization - Visit Number  --   last progress note submitted 04/26/19, next one #54   Authorization - Number of Visits  --   from last recert before Visit 44   PT Start Time  1503    PT Stop Time  1600    PT Time Calculation (min)  57 min    Activity Tolerance  Patient tolerated treatment well;No increased pain    Behavior During Therapy  WFL for tasks assessed/performed       Past Medical History:  Diagnosis Date  . Arthritis   . COPD (chronic obstructive pulmonary disease) ([place])   . Family history of adverse reaction to anesthesia    father was slow to wake up  . Fuch's endothelial dystrophy   . Hyperlipidemia   . Hypothyroidism   . PONV (postoperative nausea and vomiting)    slow to wake up and PONV    Past Surgical History:  Procedure Laterality Date  . APPENDECTOMY    . BACK SURGERY  09/26/2018   L4-5 PLIF by Dr. Arnoldo Morale  . CARDIAC CATHETERIZATION    . CHOLECYSTECTOMY    . EYE SURGERY    . FOOT SURGERY Right    pin removed left  . laser vein surgery    . NASAL SINUS SURGERY    . RIGHT/LEFT HEART CATH AND CORONARY ANGIOGRAPHY N/A 12/06/2017   Procedure: RIGHT/LEFT HEART CATH AND CORONARY ANGIOGRAPHY;  Surgeon: Yolonda Kida, MD;  Location: [place] [facility];  Service: Cardiovascular;  Laterality: N/A;  . TONSILLECTOMY      There were no vitals filed for this visit.  Subjective Assessment - 05/08/19 1634    Subjective  Pt reported  no radiating thigh tightness today after walking into the clinic but she feels tired    Limitations  Sitting;Walking;Standing         [facility] PT Assessment - 05/08/19 1602      Observation/Other Assessments   Observations  pre Tx: hip IR L, post Tx: less hip IR . Extension of knee continues be maintained       Strength   Overall Strength Comments  hip abd 3+/5 B       Palpation   SI assessment   equal ASIS  standing and supine    Palpation comment  tightness/ hypomobility L leg, hip abd/ER.  L upper SIJ and L5/S1 , glut med                    [facility] Adult PT Treatment/Exercise - 05/08/19 1600      Neuro Re-ed    Neuro Re-ed Details   modified clam shells as it caused tightness.  plantiigrade bridges with knees parallel , hip abd/ER with co-activation of transverse arches B, shoulder ext on chair. ( pt reproted no tightness)        Moist Heat Therapy   Number Minutes Moist Heat  5 Minutes    Moist Heat Location  --   sacrum/ guided relaxation     Manual Therapy   Soft tissue mobilization  long axis distraction on L leg, hip abd/ER.  jjostling at L upper SIJ and L5/S1 , glut med                PT Short Term Goals - 04/26/19 1307      PT SHORT TERM GOAL #1   Title  Patient will demonstrate improved pelvic alignment and balance of musculature surrounding the pelvis to facilitate decreased PFM spasms and decrease pelvic pain.    Baseline  Pt. demonstrates severe posterior pelvic tilt, L thoracic/R lumbar scoliosis, and R anterior/L posterior pelvic obliquity. Pt. has demonstrated improved pelvic alignment and posture but has not been able to sustain long-term due to continued lack of postural strength.    Time  5    Period  Weeks    Status  Achieved    Target Date  03/06/19      PT SHORT TERM GOAL #2   Title  Patient will demonstrate a coordinated contraction, relaxation, and bulge of the pelvic floor muscles to demonstrate functional recruitment and motion  and allow for bladder emptying.    Baseline  Pt. is unable to fully empty bladder and describes pain with catheterization, demonstrating high tone of PFM.    Time  5    Period  Weeks    Status  Achieved    Target Date  03/06/19      PT SHORT TERM GOAL #3   Title  Patient will demonstrate improved sitting and standing posture to demonstrate learning and decrease stress on the pelvic floor with functional activity.    Baseline  Pt. demonstrates severe posterior pelvic tilt, L thoracic/R lumbar scoliosis, and R anterior/L posterior pelvic obliquity. 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 Will be able to cath without pain and will be cleared by physiscian to d/c self-cath due to decreased residual volume with bladder emptying.    Baseline  required to cath 3 times per day, painful each time.    Time  10    Period  Weeks    Status  Achieved      PT LONG TERM GOAL #4   Title  Patient will score less than or equal to 40% on the Female NIH-CPSI and 30% on the [facility] to demonstrate a reduction in  pain, urinary symptoms, and an improved quality of life.    Baseline  Female NIH-CPSI: 38/43 (88%) , PDI: 45/75 (60%); PDI 44/75 (59%) and Female NIH-CPSI: 27/43 (64%) on 02-08-19 , Female NIH-CPSI: 17% on 03-26-19    Time  10    Period  Weeks    Status  Achieved      PT LONG TERM GOAL #5   Title  Patient will demonstrate increased step length with reciprical arm swing, and demonstrate no scissoring steps.    Baseline  Pt currently demonstrated scissoring steppage on R LE, increased trunk rotation and deep core engagement with reciprical walking poles.    Time  10    Period  Weeks    Status  Achieved      PT LONG TERM GOAL #6   Title  Pt will report no radiating pain down LLE after walking treadmill for 10 min in order to progress with aerobic exercises    Baseline  after 7 min    Time  8    Period  Weeks    Status  On-going      PT LONG TERM GOAL #7   Title  Pt will report walking up > 2 mornings in a row with pain level < 5-6/10 in order to demo maintanence of Tx benefits and more balance of mm tensions achieved with HEP, shoe lift to improve QOL    Baseline  7-8/10 level and benefits from Tx lasted only one morning  (03-19-19 and 03-23-19)    Time  10    Period  Weeks    Status  Partially Met      PT LONG TERM GOAL #8   Title  Pt will be able to demo increased PF strength with unilateral UE support on wall from B 3/5 15 reps in order to achieve stronger push off in gait and walk longer distance    Baseline  B, L 7 reps  2/5, 10 reps R 3/5  ( 12/17: 15 reps R , 13 reps L)    Time  10    Period  Weeks    Status  Partially Met            Plan - 05/08/19 1600    Clinical Impression Statement  Focused on increasing hip ER in knee ext today with more manual Tx. Pt demo'd more hip abd/ ER in standing with knee ext post Tx.  Pt required alterative hip abduction strengthening as clam shell caused more tightness at knee.  Plantigrade reversed position did not cause tightness and also  promoted hip extension and scapular stabilization.  Pt also tolerated legs propped at ~120 deg from trunk in relaxation training position which pt reported to be the first time she has been able to experience this position without pain, indicating improvement.   Plan to assess cycling as alternative to treadmill walking during next session. Pt continues to benefit from skilled PT.    Personal Factors and Comorbidities  Age;Comorbidity 3+    Comorbidities  Osteoporosis, scoliosis, Arthritis, COPD, recent lumbosacral fusion for spondylolisthesis.    Examination-Activity Limitations  Toileting;Sit;Stand;Bend;Lift;Carry    Examination-Participation Restrictions  Interpersonal Relationship;Yard Work;Cleaning;Laundry    Stability/Clinical  Decision Making  Unstable/Unpredictable    Rehab Potential  Good    PT Frequency  2x / week    PT Duration  Other (comment)   20   PT Treatment/Interventions  ADLs/Self Care Home Management;Aquatic Therapy;Moist Heat;Electrical Stimulation;Traction;Therapeutic activities;Functional mobility training;Stair training;Gait training;Therapeutic exercise;Balance training;Neuromuscular re-education;Patient/family education;Manual techniques;Dry needling;Passive range of motion;Scar mobilization;Taping    Consulted and Agree with Plan of Care  Patient       Patient will benefit from skilled therapeutic intervention in order to improve the following deficits and impairments:  Abnormal gait, Decreased balance, Increased muscle spasms, Decreased range of motion, Decreased scar mobility, Improper body mechanics, Decreased coordination, Decreased strength, Increased fascial restricitons, Impaired flexibility, Postural dysfunction, Pain  Visit Diagnosis: Abnormal posture  Other muscle spasm     Problem List Patient Active Problem List   Diagnosis Date Noted  . Chronic UTI 12/06/2018  . Status post lumbar surgery 12/06/2018  . Spondylolisthesis, lumbar region 09/26/2018   . Aortic atherosclerosis ([place]) 08/31/2018  . CAD (coronary artery disease) 02/23/2018  . Advanced care planning/counseling discussion 03/28/2017  . Varicose veins of both lower extremities with complications 57/97/2820  . Arthritis 12/09/2016  . Vaginal atrophy 09/10/2015  . Menopausal hot flushes 09/10/2015  . Onychomycosis due to dermatophyte 04/07/2015  . Bursitis of right shoulder 02/26/2015  . Environmental and seasonal allergies 02/26/2015  . Other allergic rhinitis 02/26/2015  . Osteoporosis 11/26/2014  . COPD (chronic obstructive pulmonary disease) ([place]) 11/26/2014  . Fuchs' corneal dystrophy 11/26/2014  . Benign hypertensive renal disease 11/26/2014  . Hyperlipidemia 11/26/2014  . Chronic kidney disease, stage III (moderate) ([place]) 11/26/2014  . Hypothyroidism 11/26/2014  . Endothelial corneal dystrophy 11/26/2014    Jerl Mina ,PT, DPT, E-RYT  05/08/2019, 4:34 PM  [place] MAIN [facility] SERVICES [address] [place], [place], [address] Phone: [phone]   Fax:  [phone]  Name: Aya C Passow MRN: [id number] Date of Birth: 1945-09-19

## 2019-05-10 ENCOUNTER — Other Ambulatory Visit: Payer: Self-pay

## 2019-05-10 ENCOUNTER — Ambulatory Visit: Payer: Medicare Other | Admitting: Physical Therapy

## 2019-05-10 DIAGNOSIS — M62838 Other muscle spasm: Secondary | ICD-10-CM

## 2019-05-10 DIAGNOSIS — R293 Abnormal posture: Secondary | ICD-10-CM

## 2019-05-10 NOTE — Therapy (Addendum)
[place] MAIN [facility] SERVICES [address] [place], [place], [address] Phone: [phone]   Fax:  [phone]  Physical Therapy Treatment  Patient Details  Name: Julia Deleon MRN: [id number] Date of Birth: Jun 05, 1945 Referring Provider (PT): Dr. Maryan Puls   Encounter Date: 05/10/2019  PT End of Session - 05/10/19 1507    Visit Number  48    Number of Visits  68    Date for PT Re-Evaluation  02/26//21    Authorization Type  Medicare    Authorization Time Period  05/15/2019    Authorization - Visit Number  --   last progress note submitted 04/26/19, next one #54   Authorization - Number of Visits  --   from last recert before Visit 44   PT Start Time  1300    PT Stop Time  1400    PT Time Calculation (min)  60 min    Activity Tolerance  Patient tolerated treatment well;No increased pain    Behavior During Therapy  WFL for tasks assessed/performed       Past Medical History:  Diagnosis Date  . Arthritis   . COPD (chronic obstructive pulmonary disease) ([place])   . Family history of adverse reaction to anesthesia    father was slow to wake up  . Fuch's endothelial dystrophy   . Hyperlipidemia   . Hypothyroidism   . PONV (postoperative nausea and vomiting)    slow to wake up and PONV    Past Surgical History:  Procedure Laterality Date  . APPENDECTOMY    . BACK SURGERY  09/26/2018   L4-5 PLIF by Dr. Arnoldo Morale  . CARDIAC CATHETERIZATION    . CHOLECYSTECTOMY    . EYE SURGERY    . FOOT SURGERY Right    pin removed left  . laser vein surgery    . NASAL SINUS SURGERY    . RIGHT/LEFT HEART CATH AND CORONARY ANGIOGRAPHY N/A 12/06/2017   Procedure: RIGHT/LEFT HEART CATH AND CORONARY ANGIOGRAPHY;  Surgeon: Yolonda Kida, MD;  Location: [place] [facility];  Service: Cardiovascular;  Laterality: N/A;  . TONSILLECTOMY      There were no vitals filed for this visit.  Subjective Assessment - 05/10/19 1327    Subjective  The  tightness is a little bit below the knee but it is not bad    Limitations  Sitting;Walking;Standing         [facility] PT Assessment - 05/10/19 1537      Observation/Other Assessments   Observations  lunge position with L knee ext difficulty with LOB despite cues for wider stance.        Palpation   SI assessment   equal ASIS standing and supine    Palpation comment  tightness at L QL       Ambulation/Gait   Gait Comments  anteror rotation of R pelvic girdle in gait. L hip anterior rotation limited, hip ext/ knee ext present but pelvic rotation limited compared to R due to scoliosis rotation                     [facility] Adult PT Treatment/Exercise - 05/10/19 1537      Therapeutic Activites    Other Therapeutic Activities  cued for more L hip ext, R trunk rotation in gait.       Neuro Re-ed    Neuro Re-ed Details   Cued for multifidis twist seated andPNF but pt had difficulty. Degressed to  standing swimmers/ seated marching with arm swings       Manual Therapy   Soft tissue mobilization  STM/MWM at L QL                PT Short Term Goals - 04/26/19 1307      PT SHORT TERM GOAL #1   Title  Patient will demonstrate improved pelvic alignment and balance of musculature surrounding the pelvis to facilitate decreased PFM spasms and decrease pelvic pain.    Baseline  Pt. demonstrates severe posterior pelvic tilt, L thoracic/R lumbar scoliosis, and R anterior/L posterior pelvic obliquity. Pt. has demonstrated improved pelvic alignment and posture but has not been able to sustain long-term due to continued lack of postural strength.    Time  5    Period  Weeks    Status  Achieved    Target Date  03/06/19      PT SHORT TERM GOAL #2   Title  Patient will demonstrate a coordinated contraction, relaxation, and bulge of the pelvic floor muscles to demonstrate functional recruitment and motion and allow for bladder emptying.    Baseline  Pt. is unable to fully empty bladder  and describes pain with catheterization, demonstrating high tone of PFM.    Time  5    Period  Weeks    Status  Achieved    Target Date  03/06/19      PT SHORT TERM GOAL #3   Title  Patient will demonstrate improved sitting and standing posture to demonstrate learning and decrease stress on the pelvic floor with functional activity.    Baseline  Pt. demonstrates severe posterior pelvic tilt, L thoracic/R lumbar scoliosis, and R anterior/L posterior pelvic obliquity. 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 Will be able to cath without pain and will be cleared by physiscian to d/c self-cath due to decreased residual volume with bladder emptying.    Baseline  required to cath 3 times per day, painful each time.    Time  10    Period  Weeks    Status  Achieved      PT LONG  TERM GOAL #4   Title  Patient will score less than or equal to 40% on the Female NIH-CPSI and 30% on the [facility] to demonstrate a reduction in pain, urinary symptoms, and an improved quality of life.    Baseline  Female  NIH-CPSI: 38/43 (88%) , PDI: 45/75 (60%); PDI 44/75 (59%) and Female NIH-CPSI: 27/43 (64%) on 02-08-19 , Female NIH-CPSI: 17% on 03-26-19    Time  10    Period  Weeks    Status  Achieved      PT LONG TERM GOAL #5   Title  Patient will demonstrate increased step length with reciprical arm swing, and demonstrate no scissoring steps.    Baseline  Pt currently demonstrated scissoring steppage on R LE, increased trunk rotation and deep core engagement with reciprical walking poles.    Time  10    Period  Weeks    Status  Achieved      PT LONG TERM GOAL #6   Title  Pt will report no radiating pain down LLE after walking treadmill for 10 min in order to progress with aerobic exercises    Baseline  after 7 min    Time  8    Period  Weeks    Status  On-going      PT LONG TERM GOAL #7   Title  Pt will report walking up > 2 mornings in a row with pain level < 5-6/10 in order to demo maintanence of Tx benefits and more balance of mm tensions achieved with HEP, shoe lift to improve QOL    Baseline  7-8/10 level and benefits from Tx lasted only one morning  (03-19-19 and 03-23-19)    Time  10    Period  Weeks    Status  Partially Met      PT LONG TERM GOAL #8   Title  Pt will be able to demo increased PF strength with unilateral UE support on wall from B 3/5 15 reps in order to achieve stronger push off in gait and walk longer distance    Baseline  B, L 7 reps  2/5, 10 reps R 3/5  ( 12/17: 15 reps R , 13 reps L)    Time  10    Period  Weeks    Status  Partially Met            Plan - 05/10/19 1507    Clinical Impression Statement  Pt continues to show increased stronger push off and minimal R trunk lean. L pelvic rotation anterior direction is lacking due to scoliotic rotational component.  Tried to advance pt to multifidis twist seated with band, lunge position for PNF to adddress rotational component but pt had difficulty or complained of LBP pain. Downgraded to seated marching/ standing  swimmers which pt had no complaints. These observations deem pt not ready for cycling.  Manual Tx addresed L QL tightness today while L ITband, leg pliability remains present. Knee ext has improved but will require further manual and other Tx. Pt continues to benefit from skilled PT.    Personal Factors and Comorbidities  Age;Comorbidity 3+    Comorbidities  Osteoporosis, scoliosis, Arthritis, COPD, recent lumbosacral fusion for spondylolisthesis.    Examination-Activity Limitations  Toileting;Sit;Stand;Bend;Lift;Carry    Examination-Participation Restrictions  Interpersonal Relationship;Yard Work;Cleaning;Laundry    Stability/Clinical Decision Making  Unstable/Unpredictable    Rehab Potential  Good    PT Frequency  2x / week    PT Duration  Other (  comment)   20   PT Treatment/Interventions  ADLs/Self Care Home Management;Aquatic Therapy;Moist Heat;Electrical Stimulation;Traction;Therapeutic activities;Functional mobility training;Stair training;Gait training;Therapeutic exercise;Balance training;Neuromuscular re-education;Patient/family education;Manual techniques;Dry needling;Passive range of motion;Scar mobilization;Taping    Consulted and Agree with Plan of Care  Patient       Patient will benefit from skilled therapeutic intervention in order to improve the following deficits and impairments:  Abnormal gait, Decreased balance, Increased muscle spasms, Decreased range of motion, Decreased scar mobility, Improper body mechanics, Decreased coordination, Decreased strength, Increased fascial restricitons, Impaired flexibility, Postural dysfunction, Pain  Visit Diagnosis: Abnormal posture  Other muscle spasm     Problem List Patient Active Problem List   Diagnosis Date Noted  . Chronic UTI 12/06/2018  . Status post lumbar surgery 12/06/2018  . Spondylolisthesis, lumbar region 09/26/2018  . Aortic atherosclerosis ([place]) 08/31/2018  . CAD (coronary artery disease) 02/23/2018  . Advanced  care planning/counseling discussion 03/28/2017  . Varicose veins of both lower extremities with complications 44/73/9584  . Arthritis 12/09/2016  . Vaginal atrophy 09/10/2015  . Menopausal hot flushes 09/10/2015  . Onychomycosis due to dermatophyte 04/07/2015  . Bursitis of right shoulder 02/26/2015  . Environmental and seasonal allergies 02/26/2015  . Other allergic rhinitis 02/26/2015  . Osteoporosis 11/26/2014  . COPD (chronic obstructive pulmonary disease) ([place]) 11/26/2014  . Fuchs' corneal dystrophy 11/26/2014  . Benign hypertensive renal disease 11/26/2014  . Hyperlipidemia 11/26/2014  . Chronic kidney disease, stage III (moderate) ([place]) 11/26/2014  . Hypothyroidism 11/26/2014  . Endothelial corneal dystrophy 11/26/2014    Jerl Mina ,PT, DPT, E-RYT  05/10/2019, 5:44 PM  [place] MAIN [facility] SERVICES [address] [place], [place], [address] Phone: [phone]   Fax:  [phone]  Name: Ranelle C Lanuza MRN: [id number] Date of Birth: 1945-06-17

## 2019-05-10 NOTE — Patient Instructions (Addendum)
Rotation component for scoliosis   _ seated R hand on L thigh, L hand by your side, past hip In hale, sit tall Exhale, turn navel to L, chest to L, then look over L shoulder   10 reps  ____  See videos   Seated marching with arm swings   65min  Standing step back with opp arm up  3 min

## 2019-05-15 ENCOUNTER — Other Ambulatory Visit: Payer: Self-pay

## 2019-05-15 ENCOUNTER — Ambulatory Visit: Payer: Medicare Other | Attending: Urology | Admitting: Physical Therapy

## 2019-05-15 DIAGNOSIS — M62838 Other muscle spasm: Secondary | ICD-10-CM | POA: Diagnosis not present

## 2019-05-15 DIAGNOSIS — R293 Abnormal posture: Secondary | ICD-10-CM | POA: Insufficient documentation

## 2019-05-15 NOTE — Patient Instructions (Addendum)
Discontinue standing swimmers   Seated marching: 3 min ballmounds press down and then heels  Equal weight down on both legs   Another 3 set   ___  Figure 4 stretch modified  On bench   body turned turned 45 deg 5 breaths leaning and then forward lean  5 breath   And in between gym exercises   Add to your gym routine:   5 times sit to stand  L knee out slight Once standing, more weight onto outer edge of foot and more weight into the whole leg into earth  ____   At home this week:  Retraining the brain  1) more pressure against the headboard on the L foot 70%  R 30%  ballmounds  Press shoulders and head down at the same time of exhale  10 reps  Rest   2) body scan  ( email)   10 min Soft sound alarm Gradual transitions

## 2019-05-16 NOTE — Therapy (Signed)
[place] MAIN [facility] SERVICES [address] [place], [place], [address] Phone: [phone]   Fax:  [phone]  Physical Therapy Treatment  Patient Details  Name: Julia Deleon MRN: [id number] Date of Birth: [birth year] Referring Provider (PT): Dr. Maryan Puls   Encounter Date: 05/15/2019  PT End of Session - 05/15/19 1235    Visit Number  44    Number of Visits  60    Date for PT Re-Evaluation  07/06/19   next progress note needs to be on Visit #54   Authorization Type  Medicare    Authorization Time Period  --    Authorization - Visit Number  --    Authorization - Number of Visits  --    PT Start Time  1100    PT Stop Time  1230    PT Time Calculation (min)  90 min    Activity Tolerance  Patient tolerated treatment well;No increased pain    Behavior During Therapy  WFL for tasks assessed/performed       Past Medical History:  Diagnosis Date  . Arthritis   . COPD (chronic obstructive pulmonary disease) ([place])   . Family history of adverse reaction to anesthesia    father was slow to wake up  . Fuch's endothelial dystrophy   . Hyperlipidemia   . Hypothyroidism   . PONV (postoperative nausea and vomiting)    slow to wake up and PONV    Past Surgical History:  Procedure Laterality Date  . APPENDECTOMY    . BACK SURGERY  09/26/2018   L4-5 PLIF by Dr. Arnoldo Morale  . CARDIAC CATHETERIZATION    . CHOLECYSTECTOMY    . EYE SURGERY    . FOOT SURGERY Right    pin removed left  . laser vein surgery    . NASAL SINUS SURGERY    . RIGHT/LEFT HEART CATH AND CORONARY ANGIOGRAPHY N/A 12/06/2017   Procedure: RIGHT/LEFT HEART CATH AND CORONARY ANGIOGRAPHY;  Surgeon: Yolonda Kida, MD;  Location: [facility];  Service: Cardiovascular;  Laterality: N/A;  . TONSILLECTOMY      There were no vitals filed for this visit.  Subjective Assessment - 05/15/19 1107    Subjective  Pt can only do the swimmers for only 1 min because it hurts  and it is tight all the way down to the L leg. But she can do the seated marches for 3 min.    Limitations  Sitting;Walking;Standing         [facility] PT Assessment - 05/16/19 1529      Other:   Other/ Comments  genu valgus on sit to stand and once standing, pt places more weight on RLE and less weight on L       AROM   Overall AROM Comments  knee ext in supine: L 15 deg       Strength : hip abduction L 3/5 , R 4+/5               [facility] Adult PT Treatment/Exercise - 05/16/19 1520      Therapeutic Activites    Other Therapeutic Activities  excessive cues for sit stand with propioception of knee alignment, and once standing more pressure into LLE       Neuro Re-ed    Neuro Re-ed Details   excessive cues for more L LE propioception in CKC supine exercise to translate into standing      Moist Heat Therapy   Number  Minutes Moist Heat  5 Minutes    Moist Heat Location  --   sacrum/ during new HEP instruction     Manual Therapy   Soft tissue mobilization  STM/ MWM at obt int                PT Short Term Goals - 04/26/19 1307      PT SHORT TERM GOAL #1   Title  Patient will demonstrate improved pelvic alignment and balance of musculature surrounding the pelvis to facilitate decreased PFM spasms and decrease pelvic pain.    Baseline  Pt. demonstrates severe posterior pelvic tilt, L thoracic/R lumbar scoliosis, and R anterior/L posterior pelvic obliquity. Pt. has demonstrated improved pelvic alignment and posture but has not been able to sustain long-term due to continued lack of postural strength.    Time  5    Period  Weeks    Status  Achieved    Target Date  03/06/19      PT SHORT TERM GOAL #2   Title  Patient will demonstrate a coordinated contraction, relaxation, and bulge of the pelvic floor muscles to demonstrate functional recruitment and motion and allow for bladder emptying.    Baseline  Pt. is unable to fully empty bladder and describes pain with  catheterization, demonstrating high tone of PFM.    Time  5    Period  Weeks    Status  Achieved    Target Date  03/06/19      PT SHORT TERM GOAL #3   Title  Patient will demonstrate improved sitting and standing posture to demonstrate learning and decrease stress on the pelvic floor with functional activity.    Baseline  Pt. demonstrates severe posterior pelvic tilt, L thoracic/R lumbar scoliosis, and R anterior/L posterior pelvic obliquity. 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 Will be able to cath without pain and will be cleared by physiscian to d/c self-cath due to decreased residual volume with bladder emptying.    Baseline  required to cath 3 times per day, painful each time.    Time  10    Period  Weeks  Status  Achieved      PT LONG TERM GOAL #4   Title  Patient will score less than or equal to 40% on the Female NIH-CPSI and 30% on the [facility] to demonstrate a reduction in pain, urinary symptoms, and an improved quality of life.    Baseline  Female NIH-CPSI: 38/43 (88%) , PDI:  45/75 (60%); PDI 44/75 (59%) and Female NIH-CPSI: 27/43 (64%) on 02-08-19 , Female NIH-CPSI: 17% on 03-26-19    Time  10    Period  Weeks    Status  Achieved      PT LONG TERM GOAL #5   Title  Patient will demonstrate increased step length with reciprical arm swing, and demonstrate no scissoring steps.    Baseline  Pt currently demonstrated scissoring steppage on R LE, increased trunk rotation and deep core engagement with reciprical walking poles.    Time  10    Period  Weeks    Status  Achieved      PT LONG TERM GOAL #6   Title  Pt will report no radiating pain down LLE after walking treadmill for 10 min in order to progress with aerobic exercises    Baseline  after 7 min    Time  8    Period  Weeks    Status  On-going      PT LONG TERM GOAL #7   Title  Pt will report walking up > 2 mornings in a row with pain level < 5-6/10 in order to demo maintanence of Tx benefits and more balance of mm tensions achieved with HEP, shoe lift to improve QOL    Baseline  7-8/10 level and benefits from Tx lasted only one morning  (03-19-19 and 03-23-19)    Time  10    Period  Weeks    Status  Partially Met      PT LONG TERM GOAL #8   Title  Pt will be able to demo increased PF strength with unilateral UE support on wall from B 3/5 15 reps in order to achieve stronger push off in gait and walk longer distance    Baseline  B, L 7 reps  2/5, 10 reps R 3/5  ( 12/17: 15 reps R , 13 reps L)    Time  10    Period  Weeks    Status  Partially Met            Plan - 05/16/19 1520    Clinical Impression Statement  Pt has not worked with the fitness trainer for the past 2 weeks.  This allowed for eliminating additional factors hindering progress. Pt was in the past performing some of her gym exercises with excessive genu valgus on LLE.  For the past visits, focus has been placed on increasing hip ext, knee ext, more transverse arch co-activation in foot to minimize L genu valgus. Today,  pt showed knee  ext in supine at 15 deg which is an improvement of 30 deg from past weeks. In combination with her past improvements:, more fat pads at ballmounds/ stronger transverse arch co-activation, and stronger push off in gait, knee extension AROM will facilitate less pain in gait and pt will be more ready to return to treadmill walking at a future time.    Today, pelvic floor tightness on L obturator internus required manual Tx. Following Tx, pt demo'd more hip external rotation/ abduction but pt required excessive propioception cues to place more weight onto LLE and  ways to minimize pronation and genu valgus in stance. Pt also required excessive cues for proper knee alignment in sit to stand.  L hip abduction was weaker than R.  Anticipate with more L hip strengthening, less genu valgus, more weight bearing in L feet will help minimize pt's deviations and her reports of tightness. Once these improvements are in place, pt will be ready to return to walking on treadmill. Today, implemented propioception training for more equal weight bearing on LLE given pt's past habits of not loading on the foot due to overlapping metatarsal II-III, which likely lead to pt's offloading and subsequent genu valgus, and poor alignment at the knee impacting L hip. Plus, pt also had scoliosis playing a role as well but scoliosis HEP and Tx have helped with more upright posture, less thoracic kyphosis, more pelvic / thorax reciporal rotation.   Pt reports no more radiation of pain across medial knee nor past knee the past 2 visits. Pt reports mostly  "tightness". Pt's mm tightness along IT band, hamstrings, leg also have remained absent for the past few sessions.  Began pain science education today and implemented body scan practice. Pt typically reports she has lots to go and to get done. Pt reported feeling more relaxed post relaxation training. While musculoskeletal alignments will continue be addressed, biopsychosocial approaches will  also be added to yield better outcomes.    Pt continues to benefit from skilled PT.      Personal Factors and Comorbidities  Age;Comorbidity 3+    Comorbidities  Osteoporosis, scoliosis, Arthritis, COPD, recent lumbosacral fusion for spondylolisthesis.    Examination-Activity Limitations  Toileting;Sit;Stand;Bend;Lift;Carry    Examination-Participation Restrictions  Interpersonal Relationship;Yard Work;Cleaning;Laundry    Stability/Clinical Decision Making  Unstable/Unpredictable    Rehab Potential  Good    PT Frequency  2x / week    PT Duration  Other (comment)   20   PT Treatment/Interventions  ADLs/Self Care Home Management;Aquatic Therapy;Moist Heat;Electrical Stimulation;Traction;Therapeutic activities;Functional mobility training;Stair training;Gait training;Therapeutic exercise;Balance training;Neuromuscular re-education;Patient/family education;Manual techniques;Dry needling;Passive range of motion;Scar mobilization;Taping    Consulted and Agree with Plan of Care  Patient       Patient will benefit from skilled therapeutic intervention in order to improve the following deficits and impairments:  Abnormal gait, Decreased balance, Increased muscle spasms, Decreased range of motion, Decreased scar mobility, Improper body mechanics, Decreased coordination, Decreased strength, Increased fascial restricitons, Impaired flexibility, Postural dysfunction, Pain  Visit Diagnosis: Abnormal posture  Other muscle spasm     Problem List Patient Active Problem List   Diagnosis Date Noted  . Chronic UTI 12/06/2018  . Status post lumbar surgery 12/06/2018  . Spondylolisthesis, lumbar region 09/26/2018  . Aortic atherosclerosis ([place]) 08/31/2018  . CAD (coronary artery disease) 02/23/2018  . Advanced care planning/counseling discussion 03/28/2017  . Varicose veins of both lower extremities with complications 93/57/0177  . Arthritis 12/09/2016  . Vaginal atrophy 09/10/2015  . Menopausal  hot flushes 09/10/2015  . Onychomycosis due to dermatophyte 04/07/2015  . Bursitis of right shoulder 02/26/2015  . Environmental and seasonal allergies 02/26/2015  . Other allergic rhinitis 02/26/2015  . Osteoporosis 11/26/2014  . COPD (chronic obstructive pulmonary disease) ([place]) 11/26/2014  . Fuchs' corneal dystrophy 11/26/2014  . Benign hypertensive renal disease 11/26/2014  . Hyperlipidemia 11/26/2014  . Chronic kidney disease, stage III (moderate) ([place]) 11/26/2014  . Hypothyroidism 11/26/2014  . Endothelial corneal dystrophy 11/26/2014    Jerl Mina 05/16/2019, 3:32 PM  [place] MAIN REHAB SERVICES [address]  [place], [place], [address] Phone: [phone]   Fax:  [phone]  Name: Julia Deleon MRN: [id number] Date of Birth: 23-Aug-1945

## 2019-05-17 ENCOUNTER — Encounter: Payer: Medicare Other | Admitting: Physical Therapy

## 2019-05-18 ENCOUNTER — Other Ambulatory Visit: Payer: Self-pay

## 2019-05-18 ENCOUNTER — Ambulatory Visit: Payer: Medicare Other | Attending: Urology | Admitting: Physical Therapy

## 2019-05-18 DIAGNOSIS — M25511 Pain in right shoulder: Secondary | ICD-10-CM | POA: Insufficient documentation

## 2019-05-18 DIAGNOSIS — R293 Abnormal posture: Secondary | ICD-10-CM

## 2019-05-18 DIAGNOSIS — M62838 Other muscle spasm: Secondary | ICD-10-CM | POA: Diagnosis not present

## 2019-05-18 DIAGNOSIS — G8929 Other chronic pain: Secondary | ICD-10-CM | POA: Insufficient documentation

## 2019-05-18 NOTE — Patient Instructions (Addendum)
Reviewed past exercises see your videos    Cycler with the proper set up with raised seat made from folded firm blanket  Do not lean back to the seat back  Press elbows back to back seat and press forearms down on armrests to maintain up right posture  Press into pinky side of foot and track L knee slightly out  __- 5 min cycler  rest with stretches  Hamstring  Quad, hip flexor   [employer]

## 2019-05-18 NOTE — Therapy (Addendum)
[place] [facility] MAIN REHAB SERVICES [address] [place], [place], [address] Phone: [phone]   Fax:  [phone]  Physical Therapy Treatment  Patient Details  Name: Julia Deleon MRN: [id number] Date of Birth: 01/03/1946 Referring Provider (PT): Dr. Michael Wolff   Encounter Date: 05/18/2019  PT End of Session - 05/20/19 1235    Date for PT Re-Evaluation  07/06/19   next progress note needs to be on Visit #54   Authorization Type  Medicare    PT Start Time  1000    PT Stop Time  1100    PT Time Calculation (min)  60 min    Activity Tolerance  Patient tolerated treatment well;No increased pain    Behavior During Therapy  WFL for tasks assessed/performed       Past Medical History:  Diagnosis Date  . Arthritis   . COPD (chronic obstructive pulmonary disease) ([facility])   . Family history of adverse reaction to anesthesia    father was slow to wake up  . Fuch's endothelial dystrophy   . Hyperlipidemia   . Hypothyroidism   . PONV (postoperative nausea and vomiting)    slow to wake up and PONV    Past Surgical History:  Procedure Laterality Date  . APPENDECTOMY    . BACK SURGERY  09/26/2018   L4-5 PLIF by Dr. Jenkins  . CARDIAC CATHETERIZATION    . CHOLECYSTECTOMY    . EYE SURGERY    . FOOT SURGERY Right    pin removed left  . laser vein surgery    . NASAL SINUS SURGERY    . RIGHT/LEFT HEART CATH AND CORONARY ANGIOGRAPHY N/A 12/06/2017   Procedure: RIGHT/LEFT HEART CATH AND CORONARY ANGIOGRAPHY;  Surgeon: Callwood, Dwayne D, MD;  Location: [facility] INVASIVE CV [facility];  Service: Cardiovascular;  Laterality: N/A;  . TONSILLECTOMY      There were no vitals filed for this visit.  Subjective Assessment - 05/20/19 1231    Subjective  Pt returned to the gym for the first time in 2 weeks and reported feeling the same level of tightness afterwards. Pt did not perform SLS / hip ext nor the treadmill    Limitations  Sitting;Walking;Standing          [facility] PT Assessment - 05/20/19 1232      Observation/Other Assessments   Observations  review past HEP, required moderate cues for form and technique       Other:   Other/ Comments  poor knee propioception while on recumbent bike, required cues of alignment for more hip abd ER to abduct knee                    [facility] Adult PT Treatment/Exercise - 05/20/19 1233      Therapeutic Activites    Other Therapeutic Activities  assessed pt on recumbent bike for 5min, no increased pain. cued for  more hip abd ER to abduct knee      Neuro Re-ed    Neuro Re-ed Details   excessive cues for alignment and technique in past HEP review                PT Short Term Goals - 04/26/19 1307      PT SHORT TERM GOAL #1   Title  Patient will demonstrate improved pelvic alignment and balance of musculature surrounding the pelvis to facilitate decreased PFM spasms and decrease pelvic pain.    Baseline  Pt. demonstrates severe posterior pelvic   tilt, L thoracic/R lumbar scoliosis, and R anterior/L posterior pelvic obliquity. Pt. has demonstrated improved pelvic alignment and posture but has not been able to sustain long-term due to continued lack of postural strength.    Time  5    Period  Weeks    Status  Achieved    Target Date  03/06/19      PT SHORT TERM GOAL #2   Title  Patient will demonstrate a coordinated contraction, relaxation, and bulge of the pelvic floor muscles to demonstrate functional recruitment and motion and allow for bladder emptying.    Baseline  Pt. is unable to fully empty bladder and describes pain with catheterization, demonstrating high tone of PFM.    Time  5    Period  Weeks    Status  Achieved    Target Date  03/06/19      PT SHORT TERM GOAL #3   Title  Patient will demonstrate improved sitting and standing posture to demonstrate learning and decrease stress on the pelvic floor with functional activity.    Baseline  Pt. demonstrates severe posterior  pelvic tilt, L thoracic/R lumbar scoliosis, and R anterior/L posterior pelvic obliquity. 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 Will be able to cath without pain and will be cleared by physiscian to d/c self-cath due to decreased residual volume with bladder emptying.    Baseline  required to cath 3 times per day, painful each time.    Time  10    Period  Weeks    Status  Achieved      PT LONG TERM GOAL #4   Title  Patient will score less than or equal to 40% on the Female NIH-CPSI and 30% on the PDI to demonstrate a reduction in pain, urinary symptoms, and an improved quality of life.    Baseline  Female NIH-CPSI: 38/43 (88%) , PDI: 45/75 (60%); PDI 44/75 (59%) and Female NIH-CPSI: 27/43 (64%) on 02-08-19 , Female NIH-CPSI: 17% on 03-26-19    Time  10    Period  Weeks    Status  Achieved      PT LONG   TERM GOAL #5   Title  Patient will demonstrate increased step length with reciprical arm swing, and demonstrate no scissoring steps.    Baseline  Pt currently demonstrated scissoring steppage  on R LE, increased trunk rotation and deep core engagement with reciprical walking poles.    Time  10    Period  Weeks    Status  Achieved      PT LONG TERM GOAL #6   Title  Pt will report no radiating pain down LLE after walking treadmill for 10 min in order to progress with aerobic exercises    Baseline  after 7 min    Time  8    Period  Weeks    Status  On-going      PT LONG TERM GOAL #7   Title  Pt will report walking up > 2 mornings in a row with pain level < 5-6/10 in order to demo maintanence of Tx benefits and more balance of mm tensions achieved with HEP, shoe lift to improve QOL    Baseline  7-8/10 level and benefits from Tx lasted only one morning  (03-19-19 and 03-23-19)    Time  10    Period  Weeks    Status  Partially Met      PT LONG TERM GOAL #8   Title  Pt will be able to demo increased PF strength with unilateral UE support on wall from B 3/5 15 reps in order to achieve stronger push off in gait and walk longer distance    Baseline  B, L 7 reps  2/5, 10 reps R 3/5  ( 12/17: 15 reps R , 13 reps L)    Time  10    Period  Weeks    Status  Partially Met            Plan - 05/20/19 1235    Clinical Impression Statement  Pt required cues for previous HEP to improve proper alignment and less genu valgus. Introduced more global mm stretches after assessing pt perform recumbent bike for 65mn. Pt also required cues for alignment knee and hip with more abduction and ER to minimize genu valgus which was associated with her c/o tightness. Recommended to buy portable cycler and educated pt on how to create proper seat height to minimize excessive hip flexion, and to use armrests to engage  co-activation of scapular stabilization muscles in CKC position. Pt continues to benefit from skilled PT     Personal Factors and Comorbidities  Age;Comorbidity 3+    Comorbidities  Osteoporosis, scoliosis, Arthritis, COPD, recent lumbosacral fusion for spondylolisthesis.     Examination-Activity Limitations  Toileting;Sit;Stand;Bend;Lift;Carry    Examination-Participation Restrictions  Interpersonal Relationship;Yard Work;Cleaning;Laundry    Stability/Clinical Decision Making  Unstable/Unpredictable    Rehab Potential  Good    PT Frequency  2x / week    PT Duration  Other (comment)   20   PT Treatment/Interventions  ADLs/Self Care Home Management;Aquatic Therapy;Moist Heat;Electrical Stimulation;Traction;Therapeutic activities;Functional mobility training;Stair training;Gait training;Therapeutic exercise;Balance training;Neuromuscular re-education;Patient/family education;Manual techniques;Dry needling;Passive range of motion;Scar mobilization;Taping    Consulted and Agree with Plan of Care  Patient       Patient will benefit from skilled therapeutic intervention in order to improve the following deficits and impairments:  Abnormal gait, Decreased balance, Increased muscle spasms, Decreased range of motion, Decreased scar mobility, Improper body mechanics, Decreased coordination, Decreased strength, Increased fascial restricitons, Impaired flexibility, Postural dysfunction, Pain  Visit Diagnosis: Abnormal posture  Other muscle spasm  Problem List Patient Active Problem List   Diagnosis Date Noted  . Chronic UTI 12/06/2018  . Status post lumbar surgery 12/06/2018  . Spondylolisthesis, lumbar region 09/26/2018  . Aortic atherosclerosis ([place]) 08/31/2018  . CAD (coronary artery disease) 02/23/2018  . Advanced care planning/counseling discussion 03/28/2017  . Varicose veins of both lower extremities with complications 21/97/5883  . Arthritis 12/09/2016  . Vaginal atrophy 09/10/2015  . Menopausal hot flushes 09/10/2015  . Onychomycosis due to dermatophyte 04/07/2015  . Bursitis of right shoulder 02/26/2015  . Environmental and seasonal allergies 02/26/2015  . Other allergic rhinitis 02/26/2015  . Osteoporosis 11/26/2014  . COPD (chronic obstructive  pulmonary disease) ([place]) 11/26/2014  . Fuchs' corneal dystrophy 11/26/2014  . Benign hypertensive renal disease 11/26/2014  . Hyperlipidemia 11/26/2014  . Chronic kidney disease, stage III (moderate) ([place]) 11/26/2014  . Hypothyroidism 11/26/2014  . Endothelial corneal dystrophy 11/26/2014    Jerl Mina ,PT, DPT, [id number]-RYT  05/20/2019, 12:38 PM  [place] MAIN [facility] SERVICES [address] [place], [place], [address] Phone: [phone]   Fax:  [phone]  Name: Julia Deleon MRN: [id number] Date of Birth: 1945-09-27

## 2019-05-22 ENCOUNTER — Other Ambulatory Visit: Payer: Self-pay

## 2019-05-22 ENCOUNTER — Ambulatory Visit: Payer: Medicare Other | Admitting: Physical Therapy

## 2019-05-22 DIAGNOSIS — R293 Abnormal posture: Secondary | ICD-10-CM

## 2019-05-22 DIAGNOSIS — M62838 Other muscle spasm: Secondary | ICD-10-CM | POA: Diagnosis not present

## 2019-05-22 DIAGNOSIS — G8929 Other chronic pain: Secondary | ICD-10-CM | POA: Diagnosis not present

## 2019-05-22 DIAGNOSIS — M25511 Pain in right shoulder: Secondary | ICD-10-CM | POA: Diagnosis not present

## 2019-05-22 NOTE — Patient Instructions (Signed)
Discontinue the bed board leg presses   Zig zag walking   Side step, feet hip width apart, pause, equal weight on both legs, toes lifted up to lift foot arch, spread ballmounds  Side step again and feet hip width apart, pause, equal weight on both legs, toes lifted up to lift foot arch, spread ballmounds   2 laps first  X 2 day

## 2019-05-23 NOTE — Therapy (Signed)
[place] MAIN [facility] SERVICES [address] [place], [place], [address] Phone: [phone]   Fax:  [phone]  Physical Therapy Treatment  Patient Details  Name: Julia Deleon MRN: [id number] Date of Birth: 23-Mar-1946 Referring Provider (PT): Dr. Maryan Puls   Encounter Date: 05/22/2019  PT End of Session - 05/23/19 1542    Visit Number  43    Date for PT Re-Evaluation  07/06/19   next progress note needs to be on Visit #54   Authorization Type  Medicare    PT Start Time  1000    PT Stop Time  1100    PT Time Calculation (min)  60 min    Activity Tolerance  Patient tolerated treatment well;No increased pain    Behavior During Therapy  WFL for tasks assessed/performed       Past Medical History:  Diagnosis Date  . Arthritis   . COPD (chronic obstructive pulmonary disease) ([place])   . Family history of adverse reaction to anesthesia    father was slow to wake up  . Fuch's endothelial dystrophy   . Hyperlipidemia   . Hypothyroidism   . PONV (postoperative nausea and vomiting)    slow to wake up and PONV    Past Surgical History:  Procedure Laterality Date  . APPENDECTOMY    . BACK SURGERY  09/26/2018   L4-5 PLIF by Dr. Arnoldo Morale  . CARDIAC CATHETERIZATION    . CHOLECYSTECTOMY    . EYE SURGERY    . FOOT SURGERY Right    pin removed left  . laser vein surgery    . NASAL SINUS SURGERY    . RIGHT/LEFT HEART CATH AND CORONARY ANGIOGRAPHY N/A 12/06/2017   Procedure: RIGHT/LEFT HEART CATH AND CORONARY ANGIOGRAPHY;  Surgeon: Yolonda Kida, MD;  Location: [facility];  Service: Cardiovascular;  Laterality: N/A;  . TONSILLECTOMY      There were no vitals filed for this visit.  Subjective Assessment - 05/22/19 1112    Subjective  Pt had leg cramps upon waking this morning and Sunday. Pt went to the gym yesterday and tried band exercises on Sat night. Pt stretched some with these activities. The cramping eases off with  walking. Pt experienced tightness that wraps from teh outside of the knee to the inside of the knee cap with walking from parking lot to clinic.    Limitations  Sitting;Walking;Standing         [facility] PT Assessment - 05/23/19 1537      Sit to Stand   Comments  no cue required to weight bear on L LEG, more extension of knee L      Other:   Other/ Comments  self-corrects with upright posture but with posterior COM, cued for more shoulders anterior to COM to minimize weightbearing into heels       Palpation   Palpation comment  tightness at L anterior pelvic floor mm, reproduction of L lateral thigh tightness with palpation of pubic tubercle mm insertion sites. Post Tx at panterior pelvic floor mm externally, tightness around medial knee decreased. lateral knee tightness remained.  Tightness at DAB/PAB on L foot ( improved post Tx)       Ambulation/Gait   Gait Comments  more reciporcal rotation of pelvis, more hip ext on L, push off, less trunk lean to R                    [facility] Adult PT Treatment/Exercise -  05/23/19 1537      Moist Heat Therapy   Number Minutes Moist Heat  5 Minutes    Moist Heat Location  Other (comment)   pelvic area     Manual Therapy   Manual therapy comments  external: STM/MWM pelvic floor L anterior mm at probelm areas noted in assessment     Soft tissue mobilization  STM/MWM, grade II PA/AP mobs at L foot rays, DAB/PAB mm to promote more toe abduction                PT Short Term Goals - 04/26/19 1307      PT SHORT TERM GOAL #1   Title  Patient will demonstrate improved pelvic alignment and balance of musculature surrounding the pelvis to facilitate decreased PFM spasms and decrease pelvic pain.    Baseline  Pt. demonstrates severe posterior pelvic tilt, L thoracic/R lumbar scoliosis, and R anterior/L posterior pelvic obliquity. Pt. has demonstrated improved pelvic alignment and posture but has not been able to sustain long-term due to  continued lack of postural strength.    Time  5    Period  Weeks    Status  Achieved    Target Date  03/06/19      PT SHORT TERM GOAL #2   Title  Patient will demonstrate a coordinated contraction, relaxation, and bulge of the pelvic floor muscles to demonstrate functional recruitment and motion and allow for bladder emptying.    Baseline  Pt. is unable to fully empty bladder and describes pain with catheterization, demonstrating high tone of PFM.    Time  5    Period  Weeks    Status  Achieved    Target Date  03/06/19      PT SHORT TERM GOAL #3   Title  Patient will demonstrate improved sitting and standing posture to demonstrate learning and decrease stress on the pelvic floor with functional activity.    Baseline  Pt. demonstrates severe posterior pelvic tilt, L thoracic/R lumbar scoliosis, and R anterior/L posterior pelvic obliquity. 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 Will be able to cath without pain and  will be cleared by physiscian to d/c self-cath due to decreased residual  volume with bladder emptying.    Baseline  required to cath 3 times per day, painful each time.    Time  10    Period  Weeks    Status  Achieved      PT LONG TERM GOAL #4   Title  Patient will score less than or equal to 40% on the Female NIH-CPSI and 30% on the [facility] to demonstrate a reduction in pain, urinary symptoms, and an improved quality of life.    Baseline  Female NIH-CPSI: 38/43 (88%) , PDI: 45/75 (60%); PDI 44/75 (59%) and Female NIH-CPSI: 27/43 (64%) on 02-08-19 , Female NIH-CPSI: 17% on 03-26-19    Time  10    Period  Weeks    Status  Achieved      PT LONG TERM GOAL #5   Title  Patient will demonstrate increased step length with reciprical arm swing, and demonstrate no scissoring steps.    Baseline  Pt currently demonstrated scissoring steppage on R LE, increased trunk rotation and deep core engagement with reciprical walking poles.    Time  10    Period  Weeks    Status  Achieved      PT LONG TERM GOAL #6   Title  Pt will report no radiating pain down LLE after walking treadmill for 10 min in order to progress with aerobic exercises    Baseline  after 7 min    Time  8    Period  Weeks    Status  On-going      PT LONG TERM GOAL #7   Title  Pt will report walking up > 2 mornings in a row with pain level < 5-6/10 in order to demo maintanence of Tx benefits and more balance of mm tensions achieved with HEP, shoe lift to improve QOL    Baseline  7-8/10 level and benefits from Tx lasted only one morning  (03-19-19 and 03-23-19)    Time  10    Period  Weeks    Status  Partially Met      PT LONG TERM GOAL #8   Title  Pt will be able to demo increased PF strength with unilateral UE support on wall from B 3/5 15 reps in order to achieve stronger push off in gait and walk longer distance    Baseline  B, L 7 reps  2/5, 10 reps R 3/5  ( 12/17: 15 reps R , 13 reps L)    Time  10    Period  Weeks    Status  Partially Met            Plan - 05/23/19 1541    Clinical  Impression Statement  Pt demonstrated good carry over with sit to stand with more equal weightbearing on BLE, more knee extension, and co-activation of L transverse arch of foot. Therefore, progressed to upright CKC training to strengthen hip abduction and reinforce weight bearing in BLE. Required cues for more anterior COM to optimize deep core/ transverse arch co-activation with toe abduction. Pt tolerated manual Tx with decreased pressure when requested when working on foot. Pt tolerated external Tx on anterior pelvic floor mm L . Reproduction of L lateral thigh tightness occurred with palpation at L anterior pelvic floor mm and decreased post Tx. Plan to further treat this area. Plan to add step ups/ down to build plantar arch to minimize medial collapse  of arches which will help minimize L anterior pelvic floor tightness/ genu valgus/ lateral thigh tightness. Pt is making progress as pt has not had any more radiating pain down past knee the past visits. Plan to upgrade R lumbar spine strengthening to minimize shortening of that side of the spine 2/2 scoliosis to further minimize R trunk lean to even greater degree which also will help minimize L lateral thigh tightness. Pt continues to benefit from skilled PT.   Personal Factors and Comorbidities  Age;Comorbidity 3+    Comorbidities  Osteoporosis, scoliosis, Arthritis, COPD, recent lumbosacral fusion for spondylolisthesis.    Examination-Activity Limitations  Toileting;Sit;Stand;Bend;Lift;Carry    Examination-Participation Restrictions  Interpersonal Relationship;Yard Work;Cleaning;Laundry    Stability/Clinical Decision Making  Unstable/Unpredictable    Rehab Potential  Good    PT Frequency  2x / week    PT Duration  Other (comment)   20   PT Treatment/Interventions  ADLs/Self Care Home Management;Aquatic Therapy;Moist Heat;Electrical Stimulation;Traction;Therapeutic activities;Functional mobility training;Stair training;Gait training;Therapeutic  exercise;Balance training;Neuromuscular re-education;Patient/family education;Manual techniques;Dry needling;Passive range of motion;Scar mobilization;Taping    Consulted and Agree with Plan of Care  Patient       Patient will benefit from skilled therapeutic intervention in order to improve the following deficits and impairments:  Abnormal gait, Decreased balance, Increased muscle spasms, Decreased range of motion, Decreased scar mobility, Improper body mechanics, Decreased coordination, Decreased strength, Increased fascial restricitons, Impaired flexibility, Postural dysfunction, Pain  Visit Diagnosis: Abnormal posture  Other muscle spasm     Problem List Patient Active Problem List   Diagnosis Date Noted  . Chronic UTI 12/06/2018  . Status post lumbar surgery 12/06/2018  . Spondylolisthesis, lumbar region 09/26/2018  . Aortic atherosclerosis ([place]) 08/31/2018  . CAD (coronary artery disease) 02/23/2018  . Advanced care planning/counseling discussion 03/28/2017  . Varicose veins of both lower extremities with complications 14/60/4799  . Arthritis 12/09/2016  . Vaginal atrophy 09/10/2015  . Menopausal hot flushes 09/10/2015  . Onychomycosis due to dermatophyte 04/07/2015  . Bursitis of right shoulder 02/26/2015  . Environmental and seasonal allergies 02/26/2015  . Other allergic rhinitis 02/26/2015  . Osteoporosis 11/26/2014  . COPD (chronic obstructive pulmonary disease) ([place]) 11/26/2014  . Fuchs' corneal dystrophy 11/26/2014  . Benign hypertensive renal disease 11/26/2014  . Hyperlipidemia 11/26/2014  . Chronic kidney disease, stage III (moderate) ([place]) 11/26/2014  . Hypothyroidism 11/26/2014  . Endothelial corneal dystrophy 11/26/2014    Jerl Mina  ,PT, DPT, E-RYT  05/23/2019, 3:42 PM  [place] MAIN [facility] SERVICES [address] [place], [place], [address] Phone: [phone]   Fax:  [phone]  Name: Julia C  Deleon MRN: [id number] Date of Birth: May 10, 1946

## 2019-05-24 ENCOUNTER — Encounter: Payer: Medicare Other | Admitting: Physical Therapy

## 2019-05-25 ENCOUNTER — Ambulatory Visit: Payer: Medicare Other | Admitting: Physical Therapy

## 2019-05-25 ENCOUNTER — Other Ambulatory Visit: Payer: Self-pay

## 2019-05-25 DIAGNOSIS — R293 Abnormal posture: Secondary | ICD-10-CM | POA: Diagnosis not present

## 2019-05-25 DIAGNOSIS — M62838 Other muscle spasm: Secondary | ICD-10-CM | POA: Diagnosis not present

## 2019-05-25 DIAGNOSIS — G8929 Other chronic pain: Secondary | ICD-10-CM | POA: Diagnosis not present

## 2019-05-25 DIAGNOSIS — M25511 Pain in right shoulder: Secondary | ICD-10-CM | POA: Diagnosis not present

## 2019-05-25 NOTE — Patient Instructions (Signed)
Open book ( handout)  With chin tuck   Bear stretch  mini squat, doorwayi in edge of R shoulder blade  Rock to the [employer]	[place]

## 2019-05-25 NOTE — Therapy (Signed)
[place] MAIN [facility] SERVICES [address] [place], [place], [address] Phone: [phone]   Fax:  [phone]  Physical Therapy Treatment  Patient Details  Name: Julia Deleon MRN: [id number] Date of Birth: 1945-06-04 Referring Provider (PT): Dr. Maryan Puls   Encounter Date: 05/25/2019  PT End of Session - 05/25/19 1204    Visit Number  53    Date for PT Re-Evaluation  07/06/19   next progress note needs to be on Visit #54   Authorization Type  Medicare    PT Start Time  1004    PT Stop Time  1105    PT Time Calculation (min)  61 min    Activity Tolerance  Patient tolerated treatment well;No increased pain    Behavior During Therapy  WFL for tasks assessed/performed       Past Medical History:  Diagnosis Date  . Arthritis   . COPD (chronic obstructive pulmonary disease) ([place])   . Family history of adverse reaction to anesthesia    father was slow to wake up  . Fuch's endothelial dystrophy   . Hyperlipidemia   . Hypothyroidism   . PONV (postoperative nausea and vomiting)    slow to wake up and PONV    Past Surgical History:  Procedure Laterality Date  . APPENDECTOMY    . BACK SURGERY  09/26/2018   L4-5 PLIF by Dr. Arnoldo Morale  . CARDIAC CATHETERIZATION    . CHOLECYSTECTOMY    . EYE SURGERY    . FOOT SURGERY Right    pin removed left  . laser vein surgery    . NASAL SINUS SURGERY    . RIGHT/LEFT HEART CATH AND CORONARY ANGIOGRAPHY N/A 12/06/2017   Procedure: RIGHT/LEFT HEART CATH AND CORONARY ANGIOGRAPHY;  Surgeon: Yolonda Kida, MD;  Location: [facility] [facility];  Service: Cardiovascular;  Laterality: N/A;  . TONSILLECTOMY      There were no vitals filed for this visit.  Subjective Assessment - 05/25/19 1206    Subjective  Pt felt tired after last session    Limitations  Sitting;Walking;Standing         [facility] PT Assessment - 05/25/19 1152      Assessment   Medical Diagnosis          Other:   Other/  Comments  seated PNF , poor metatarsal co-activaiton , scapular depression limited on R ( reported shoulder pain post 20 reps green band)       Palpation   Palpation comment  tightness medial scapula T3-12, upper trap/ scalene/ levator on R , pect minor/ serratus ant.  reproduction of tightness lateral thigh to medial knee when palpation at R medial scapula level of T3-5                    [facility] Adult PT Treatment/Exercise - 05/25/19 1151      Neuro Re-ed    Neuro Re-ed Details   cued for hip abd and 1st metatarsal coactivation with PNF seated exercise ( withhold from HEP due to shoulder pain)       Exercises   Exercises  --   stretches for R shoulder/ thoracic/ scapular region     Manual Therapy   Manual therapy comments  STM/MWM at medial scapula T3-12, upper trap/ scalene/ levator on R , pect minor/ serratus ant                PT Short Term Goals - 04/26/19 1307  PT SHORT TERM GOAL #1   Title  Patient will demonstrate improved pelvic alignment and balance of musculature surrounding the pelvis to facilitate decreased PFM spasms and decrease pelvic pain.    Baseline  Pt. demonstrates severe posterior pelvic tilt, L thoracic/R lumbar scoliosis, and R anterior/L posterior pelvic obliquity. Pt. has demonstrated improved pelvic alignment and posture but has not been able to sustain long-term due to continued lack of postural strength.    Time  5    Period  Weeks    Status  Achieved    Target Date  03/06/19      PT SHORT TERM GOAL #2   Title  Patient will demonstrate a coordinated contraction, relaxation, and bulge of the pelvic floor muscles to demonstrate functional recruitment and motion and allow for bladder emptying.    Baseline  Pt. is unable to fully empty bladder and describes pain with catheterization, demonstrating high tone of PFM.    Time  5    Period  Weeks    Status  Achieved    Target Date  03/06/19      PT SHORT TERM GOAL #3   Title  Patient  will demonstrate improved sitting and standing posture to demonstrate learning and decrease stress on the pelvic floor with functional activity.    Baseline  Pt. demonstrates severe posterior pelvic tilt, L thoracic/R lumbar scoliosis, and R anterior/L posterior pelvic obliquity. 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 Will be able to cath without pain and will be cleared by physiscian to d/c self-cath due to decreased residual volume with bladder emptying.    Baseline  required to cath 3 times per day, painful each time.    Time  10    Period  Weeks    Status  Achieved      PT LONG TERM GOAL #4   Title  Patient will score less than or equal to 40% on the Female NIH-CPSI and 30% on the [facility] to demonstrate a reduction in pain, urinary symptoms, and an improved quality of life.    Baseline  Female NIH-CPSI: 38/43 (88%) , PDI: 45/75 (60%);  [place] 44/75 (59%) and Female NIH-CPSI: 27/43 (64%) on 02-08-19 , Female NIH-CPSI: 17% on 03-26-19    Time  10    Period  Weeks    Status  Achieved      PT LONG  TERM GOAL #5   Title  Patient will demonstrate increased step length with reciprical arm swing, and demonstrate no scissoring steps.    Baseline  Pt currently demonstrated scissoring steppage on R LE, increased trunk rotation and deep core engagement with reciprical walking poles.    Time  10    Period  Weeks    Status  Achieved      PT LONG TERM GOAL #6   Title  Pt will report no radiating pain down LLE after walking treadmill for 10 min in order to progress with aerobic exercises    Baseline  after 7 min    Time  8    Period  Weeks    Status  On-going      PT LONG TERM GOAL #7   Title  Pt will report walking up > 2 mornings in a row with pain level < 5-6/10 in order to demo maintanence of Tx benefits and more balance of mm tensions achieved with HEP, shoe lift to improve QOL    Baseline  7-8/10 level and benefits from Tx lasted only one morning  (03-19-19 and 03-23-19)    Time  10    Period  Weeks    Status  Partially Met      PT LONG TERM GOAL #8   Title  Pt will be able to demo increased PF strength with unilateral UE support on wall from B 3/5 15 reps in order to achieve stronger push off in gait and walk longer distance    Baseline  B, L 7 reps  2/5, 10 reps R 3/5  ( 12/17: 15 reps R , 13 reps L)    Time  10    Period  Weeks    Status  Partially Met            Plan - 05/25/19 1205    Clinical Impression Statement  Attempted to progress pt to upgrade on strengthening exercise to length and strengthen R lumbar spine 2/2 concave curve with scoliosis with seated PNF pattern but pt reported R shoulder pain after 20 reps with green band. Assessment showed increased mm tightness at medial scapula T3-12, upper trap/ scalene/ levator on R , pect minor/ serratus ant limiting scapular depression/ retraction. Reproduction of tightness along L lateral thigh to medial knee occurred with palpation at medial scapular mm at level of T3-5. Today's session showed the need to continue thoracic  deviations at next session to increase scapular mobility before resuming seated PNF exercise. Pt continues to benefit from skilled PT.     Personal Factors and Comorbidities  Age;Comorbidity 3+    Comorbidities  Osteoporosis, scoliosis, Arthritis, COPD, recent lumbosacral fusion for spondylolisthesis.    Examination-Activity Limitations  Toileting;Sit;Stand;Bend;Lift;Carry    Examination-Participation Restrictions  Interpersonal Relationship;Yard Work;Cleaning;Laundry    Stability/Clinical Decision Making  Unstable/Unpredictable    Rehab Potential  Good    PT Frequency  2x / week    PT Duration  Other (comment)   20   PT Treatment/Interventions  ADLs/Self Care Home Management;Aquatic Therapy;Moist Heat;Electrical Stimulation;Traction;Therapeutic activities;Functional mobility training;Stair training;Gait training;Therapeutic exercise;Balance training;Neuromuscular re-education;Patient/family education;Manual techniques;Dry needling;Passive range of motion;Scar mobilization;Taping    Consulted and Agree with Plan of Care  Patient  Patient will benefit from skilled therapeutic intervention in order to improve the following deficits and impairments:  Abnormal gait, Decreased balance, Increased muscle spasms, Decreased range of motion, Decreased scar mobility, Improper body mechanics, Decreased coordination, Decreased strength, Increased fascial restricitons, Impaired flexibility, Postural dysfunction, Pain  Visit Diagnosis: Abnormal posture  Other muscle spasm     Problem List Patient Active Problem List   Diagnosis Date Noted  . Chronic UTI 12/06/2018  . Status post lumbar surgery 12/06/2018  . Spondylolisthesis, lumbar region 09/26/2018  . Aortic atherosclerosis ([place]) 08/31/2018  . CAD (coronary artery disease) 02/23/2018  . Advanced care planning/counseling discussion 03/28/2017  . Varicose veins of both lower extremities with complications 44/96/7591  . Arthritis 12/09/2016   . Vaginal atrophy 09/10/2015  . Menopausal hot flushes 09/10/2015  . Onychomycosis due to dermatophyte 04/07/2015  . Bursitis of right shoulder 02/26/2015  . Environmental and seasonal allergies 02/26/2015  . Other allergic rhinitis 02/26/2015  . Osteoporosis 11/26/2014  . COPD (chronic obstructive pulmonary disease) ([place]) 11/26/2014  . Fuchs' corneal dystrophy 11/26/2014  . Benign hypertensive renal disease 11/26/2014  . Hyperlipidemia 11/26/2014  . Chronic kidney disease, stage III (moderate) ([place]) 11/26/2014  . Hypothyroidism 11/26/2014  . Endothelial corneal dystrophy 11/26/2014    Jerl Mina ,PT, DPT, E-RYT  05/25/2019, 12:07 PM  [place] MAIN [facility] SERVICES [address] [place], [place], [address] Phone: [phone]   Fax:  [phone]  Name: Julia Deleon MRN: [id number] Date of Birth: December 20, 1945

## 2019-05-29 ENCOUNTER — Other Ambulatory Visit: Payer: Self-pay

## 2019-05-29 ENCOUNTER — Ambulatory Visit: Payer: Medicare Other | Admitting: Physical Therapy

## 2019-05-29 DIAGNOSIS — R293 Abnormal posture: Secondary | ICD-10-CM

## 2019-05-29 DIAGNOSIS — M62838 Other muscle spasm: Secondary | ICD-10-CM | POA: Diagnosis not present

## 2019-05-29 DIAGNOSIS — M25511 Pain in right shoulder: Secondary | ICD-10-CM | POA: Diagnosis not present

## 2019-05-29 DIAGNOSIS — G8929 Other chronic pain: Secondary | ICD-10-CM | POA: Diagnosis not present

## 2019-05-29 NOTE — Therapy (Signed)
[place] MAIN [facility] SERVICES [address] [place], [place], [address] Phone: [phone]   Fax:  [phone]  Physical Therapy Treatment  Patient Details  Name: Julia Deleon MRN: [id number] Date of Birth: 1946/03/01 Referring Provider (PT): Dr. Maryan Puls   Encounter Date: 05/29/2019  PT End of Session - 05/29/19 1226    Visit Number  [id number]    Date for PT Re-Evaluation  07/06/19   next progress note needs to be on Visit #54   Authorization Type  Medicare    PT Start Time  1110    PT Stop Time  1220    PT Time Calculation (min)  70 min    Activity Tolerance  Patient tolerated treatment well;No increased pain    Behavior During Therapy  WFL for tasks assessed/performed       Past Medical History:  Diagnosis Date  . Arthritis   . COPD (chronic obstructive pulmonary disease) ([place])   . Family history of adverse reaction to anesthesia    father was slow to wake up  . Fuch's endothelial dystrophy   . Hyperlipidemia   . Hypothyroidism   . PONV (postoperative nausea and vomiting)    slow to wake up and PONV    Past Surgical History:  Procedure Laterality Date  . APPENDECTOMY    . BACK SURGERY  09/26/2018   L4-5 PLIF by Dr. Arnoldo Morale  . CARDIAC CATHETERIZATION    . CHOLECYSTECTOMY    . EYE SURGERY    . FOOT SURGERY Right    pin removed left  . laser vein surgery    . NASAL SINUS SURGERY    . RIGHT/LEFT HEART CATH AND CORONARY ANGIOGRAPHY N/A 12/06/2017   Procedure: RIGHT/LEFT HEART CATH AND CORONARY ANGIOGRAPHY;  Surgeon: Yolonda Kida, MD;  Location: [place] [facility];  Service: Cardiovascular;  Laterality: N/A;  . TONSILLECTOMY      There were no vitals filed for this visit.  Subjective Assessment - 05/29/19 1112    Subjective  Pt had R partially frozen shoulder in 2017 and underwent PT. The shoulder soreness after performing the band exercise from last session.    Limitations  Sitting;Walking;Standing         [facility]  PT Assessment - 05/29/19 1114      Observation/Other Assessments   Observations   With applied manual block at thoracic spine to maintain stacked throax over pelvis, pt is unable to raise shoulders in abduction > 90 deg without compensating with forward hip thrust. Pt reported pain at > 90 deg .  ( Post Tx: without manual blocking, pt able to stack thorax/ pelvis more anterior COM on mid/forefoot: pt achieved L ~120 deg, ~110 deg R  shoulder abduction)        AROM   Overall AROM Comments  supine: R shoulder abdcutor limited to ~ 80 deg ( post Tx: 120 deg  B)         Palpation   Palpation comment  tightness, R teres minor, L interspinals, levator, R anterior intercostals, medial scap mm R                    [facility] Adult PT Treatment/Exercise - 05/29/19 1204      Neuro Re-ed    Neuro Re-ed Details   cued for guided relaxation, stacking throax over pelvis with shoulder movements       Moist Heat Therapy   Number Minutes Moist Heat  5 Minutes  Moist Heat Location  --   thoracic/cervical ( notbilled)      Manual Therapy   Manual therapy comments  MWM/STM R teres minor, L interspinals, levator, R anterior intercostals, medial scap mm R               PT Short Term Goals - 04/26/19 1307      PT SHORT TERM GOAL #1   Title  Patient will demonstrate improved pelvic alignment and balance of musculature surrounding the pelvis to facilitate decreased PFM spasms and decrease pelvic pain.    Baseline  Pt. demonstrates severe posterior pelvic tilt, L thoracic/R lumbar scoliosis, and R anterior/L posterior pelvic obliquity. Pt. has demonstrated improved pelvic alignment and posture but has not been able to sustain long-term due to continued lack of postural strength.    Time  5    Period  Weeks    Status  Achieved    Target Date  03/06/19      PT SHORT TERM GOAL #2   Title  Patient will demonstrate a coordinated contraction, relaxation, and bulge of the pelvic floor muscles  to demonstrate functional recruitment and motion and allow for bladder emptying.    Baseline  Pt. is unable to fully empty bladder and describes pain with catheterization, demonstrating high tone of PFM.    Time  5    Period  Weeks    Status  Achieved    Target Date  03/06/19      PT SHORT TERM GOAL #3   Title  Patient will demonstrate improved sitting and standing posture to demonstrate learning and decrease stress on the pelvic floor with functional activity.    Baseline  Pt. demonstrates severe posterior pelvic tilt, L thoracic/R lumbar scoliosis, and R anterior/L posterior pelvic obliquity. 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 Will be able to cath without pain and will be cleared by physiscian to d/c self-cath due to decreased residual volume with bladder emptying.    Baseline  required to cath 3 times per day, painful each time.    Time  10    Period  Weeks  Status  Achieved      PT LONG TERM GOAL #4   Title  Patient will score less than or equal to 40% on the Female NIH-CPSI and  30% on the [facility] to demonstrate a reduction in pain, urinary symptoms, and an improved quality of life.    Baseline  Female NIH-CPSI: 38/43 (88%) , PDI: 45/75 (60%); PDI 44/75 (59%) and Female NIH-CPSI: 27/43 (64%) on 02-08-19 , Female NIH-CPSI: 17% on 03-26-19    Time  10    Period  Weeks    Status  Achieved      PT LONG TERM GOAL #5   Title  Patient will demonstrate increased step length with reciprical arm swing, and demonstrate no scissoring steps.    Baseline  Pt currently demonstrated scissoring steppage on R LE, increased trunk rotation and deep core engagement with reciprical walking poles.    Time  10    Period  Weeks    Status  Achieved      PT LONG TERM GOAL #6   Title  Pt will report no radiating pain down LLE after walking treadmill for 10 min in order to progress with aerobic exercises    Baseline  after 7 min    Time  8    Period  Weeks    Status  On-going      PT LONG TERM GOAL #7   Title  Pt will report walking up > 2 mornings in a row with pain level < 5-6/10 in order to demo maintanence of Tx benefits and more balance of mm tensions achieved with HEP, shoe lift to improve QOL    Baseline  7-8/10 level and benefits from Tx lasted only one morning  (03-19-19 and 03-23-19)    Time  10    Period  Weeks    Status  Partially Met      PT LONG TERM GOAL #8   Title  Pt will be able to demo increased PF strength with unilateral UE support on wall from B 3/5 15 reps in order to achieve stronger push off in gait and walk longer distance    Baseline  B, L 7 reps  2/5, 10 reps R 3/5  ( 12/17: 15 reps R , 13 reps L)    Time  10    Period  Weeks    Status  Partially Met            Plan - 05/29/19 1226    Clinical Impression Statement  Still witholding PNF exercise to help lengthen R concave curve at lumbar spine and to minimize R posterior rotation of thorax 2/2 scoliosis. Further applied manual Tx to address poor scapular/ intercostal mobility which pt tolerated without  increased pain. After manual Tx, pt demo'd increased shoulder abduction on L ( ~90deg to ~120 deg), R ( ~90 deg to ~110 deg) without compensatory mechanism with posterior tilt of pelvis/ forward thrust of pelvis. Regional interdependent approach continues to help with pt's complex scoliotic curves. Pt's Hx of R frozen shoulder is related to pt's scoliosis. Plan to continue increasing scapular mobility first, then progress to scapular retraction/ depression strengthening before revisiting PNF exercise to minimize shoulder pain. Pt continues to benefits from skilled PT.   Personal Factors and Comorbidities  Age;Comorbidity 3+    Comorbidities  Osteoporosis, scoliosis, Arthritis, COPD, recent lumbosacral fusion for spondylolisthesis.    Examination-Activity Limitations  Toileting;Sit;Stand;Bend;Lift;Carry    Examination-Participation Restrictions  Interpersonal Relationship;Yard Work;Cleaning;Laundry    Stability/Clinical  Decision Making  Unstable/Unpredictable    Rehab Potential  Good    PT Frequency  2x / week    PT Duration  Other (comment)   20   PT Treatment/Interventions  ADLs/Self Care Home Management;Aquatic Therapy;Moist Heat;Electrical Stimulation;Traction;Therapeutic activities;Functional mobility training;Stair training;Gait training;Therapeutic exercise;Balance training;Neuromuscular re-education;Patient/family education;Manual techniques;Dry needling;Passive range of motion;Scar mobilization;Taping    Consulted and Agree with Plan of Care  Patient       Patient will benefit from skilled therapeutic intervention in order to improve the following deficits and impairments:  Abnormal gait, Decreased balance, Increased muscle spasms, Decreased range of motion, Decreased scar mobility, Improper body mechanics, Decreased coordination, Decreased strength, Increased fascial restricitons, Impaired flexibility, Postural dysfunction, Pain  Visit Diagnosis: Abnormal posture  Other muscle  spasm     Problem List Patient Active Problem List   Diagnosis Date Noted  . Chronic UTI 12/06/2018  . Status post lumbar surgery 12/06/2018  . Spondylolisthesis, lumbar region 09/26/2018  . Aortic atherosclerosis ([place]) 08/31/2018  . CAD (coronary artery disease) 02/23/2018  . Advanced care planning/counseling discussion 03/28/2017  . Varicose veins of both lower extremities with complications 14/78/2956  . Arthritis 12/09/2016  . Vaginal atrophy 09/10/2015  . Menopausal hot flushes 09/10/2015  . Onychomycosis due to dermatophyte 04/07/2015  . Bursitis of right shoulder 02/26/2015  . Environmental and seasonal allergies 02/26/2015  . Other allergic rhinitis 02/26/2015  . Osteoporosis 11/26/2014  . COPD (chronic obstructive pulmonary disease) ([place]) 11/26/2014  . Fuchs' corneal dystrophy 11/26/2014  . Benign hypertensive renal disease 11/26/2014  . Hyperlipidemia 11/26/2014  . Chronic kidney disease, stage III (moderate) ([place]) 11/26/2014  . Hypothyroidism 11/26/2014  . Endothelial corneal dystrophy 11/26/2014    Jerl Mina ,PT, DPT, [id number]-[id number]  05/29/2019, 12:27 PM  [place] MAIN [facility] SERVICES [address] [place], [place], [address] Phone: [phone]   Fax:  [phone]  Name: Julia Deleon MRN: [id number] Date of Birth: 1945-11-02

## 2019-05-29 NOTE — Patient Instructions (Signed)
Small angel wings, arms gliding on bed ( not lifting)  Move elbow up before the point of pain

## 2019-05-30 ENCOUNTER — Encounter: Payer: Self-pay | Admitting: Urology

## 2019-05-30 ENCOUNTER — Other Ambulatory Visit: Payer: Self-pay

## 2019-05-30 ENCOUNTER — Ambulatory Visit (INDEPENDENT_AMBULATORY_CARE_PROVIDER_SITE_OTHER): Payer: Medicare Other | Admitting: Urology

## 2019-05-30 VITALS — BP 186/72 | HR 80 | Ht 64.0 in | Wt 161.0 lb

## 2019-05-30 DIAGNOSIS — R339 Retention of urine, unspecified: Secondary | ICD-10-CM

## 2019-05-30 DIAGNOSIS — N390 Urinary tract infection, site not specified: Secondary | ICD-10-CM

## 2019-05-30 DIAGNOSIS — N952 Postmenopausal atrophic vaginitis: Secondary | ICD-10-CM | POA: Diagnosis not present

## 2019-05-30 LAB — BLADDER SCAN AMB NON-IMAGING

## 2019-05-30 NOTE — Progress Notes (Signed)
05/30/2019 9:38 AM   Julia Deleon 12/30/1945 [id number]  Referring provider: Guadalupe Maple, MD No address on file  Chief Complaint  Patient presents with  . Recurrent UTI    HPI: 74 year old female here to establish care for recurrent urinary tract infections.  She was previously followed by Dr. Yves Dill.  She has a long history of incomplete bladder emptying, urinary retention and recurrent urinary tract infections.  She reports that this all started after her first pregnancy and delivery.  After that, she thinks her bladder never really worked or emptied completely.  She has been followed by multiple urologist both remotely and in the recent past.  She has had at least 3 documented urinary tract infections over the last several months, primarily pansensitive E. coli.  Symptoms associated with these infections including dysuria urgency and frequency.  She been treated with multiple rounds of antibiotics by her primary care physician.  She does have personal history of urinary retention following laminectomy in 09/2018.  She required a Foley catheter postoperatively.  She did was readmitted for for this issue as well as urinary tract infection.   She was started on intermittent self cath following this episode 3 times per day.  She was keeping very meticulous logs which she brings with her today and was grafted by her husband.  She was voiding spontaneously with catheterized volumes ranging from about 50 cc to 175 cc.  She ultimately backed off to once per day and is no longer self cathing at this point.  Bladder scan today 286.  She does not feel like she can void for [place] today in the office.  She last voided several hours ago thus bladder scan is not a true postvoid residual.  She was prescribed topical compounded estrogen cream which she has been using intravaginally initially prescribed by Dr. Enzo Bi.  More recently, she saw Dr. Kenton Kingfisher and was started on a vaginal lubricant.  She  had some irritation of this and stopped using it with back to the estrogen cream.  She cannot remember exactly what it was will call us tomorrow with with the exact dose.  Most recent pelvic exam 11/2018 by her OB/GYN, Dr. Kenton Kingfisher.  Vaginal atrophy was appreciated.  No significant prolapse was appreciated on this exam.  She denies any constipation.  She eats plenty of fiber and will take a stool softener probiotic to keep yourself regular with soft well formed bowel movements daily.  She is concerned today about hemorrhoids.  She has had some significant irritation with these.  She is to the point where she might when I have these addressed surgically.  She wonders if this is contributing to her recurrent infections.  In terms of hygiene, she does not douche or use any other scented perfumes or creams.  She wipes front to back with regular toilet paper.  For her UTI regimen, she is taking daily probiotics and recently started a cranberry/d-mannose tablet in the morning time.  She can only tolerate 1 of these per day but not able to verbalize why.  In the evenings, she is chewing a cranberry gummy.  She thinks that she has had improvement in her symptoms and no infection since she started this regimen.  She was also previously on methenamine.  This also worked well however she reports that it made her body "too acidic".  She was having skin issues and the stopped immediately after she stopped taking this medication.  She does not want to resume  this.  She is also not interested in chronic suppressive antibiotics.  She does report today that she had improvement in her symptoms with acupuncture.  She is also been working with physical therapy for her back pain on a regular basis.  On chart review, the patient was also recently seen and evaluated at [facility] urology.  She volunteered that she was seen there for second opinion and was not able to be helped much.  She did not volunteer that she had urodynamics.   Review of this data indicates that her first sensation was 51 mL.  She had a strong desire to void at 299 mm with a total capacity of 365.  There is no evidence of detrusor overactivity.  She voided without interrupted flow pattern.  Her pelvic floor and external sphincter were partially relaxed.  Peak flow rate was 7 was per second with a markedly elevated PVR.  Her bladder was moderately trabeculated.  There were no overt diverticula appreciated.  Normal compliance was appreciated.  No stress incontinence was appreciated.  She voided by abdominal straining.    PMH: Past Medical History:  Diagnosis Date  . Arthritis   . COPD (chronic obstructive pulmonary disease) ([place])   . Family history of adverse reaction to anesthesia    father was slow to wake up  . Fuch's endothelial dystrophy   . Hyperlipidemia   . Hypothyroidism   . PONV (postoperative nausea and vomiting)    slow to wake up and PONV    Surgical History: Past Surgical History:  Procedure Laterality Date  . APPENDECTOMY    . BACK SURGERY  09/26/2018   L4-5 PLIF by Dr. Arnoldo Morale  . CARDIAC CATHETERIZATION    . CHOLECYSTECTOMY    . EYE SURGERY    . FOOT SURGERY Right    pin removed left  . laser vein surgery    . NASAL SINUS SURGERY    . RIGHT/LEFT HEART CATH AND CORONARY ANGIOGRAPHY N/A 12/06/2017   Procedure: RIGHT/LEFT HEART CATH AND CORONARY ANGIOGRAPHY;  Surgeon: Yolonda Kida, MD;  Location: [facility];  Service: Cardiovascular;  Laterality: N/A;  . TONSILLECTOMY      Home Medications:  Allergies as of 05/30/2019      Reactions   Colesevelam Nausea And Vomiting, Nausea Only, Other (See Comments)   WELCHOL [facility]   Levofloxacin Hives   Benazepril Other (See Comments)   Methylisothiazolinone Other (See Comments)   Positive on allergy test   Thimerosal Other (See Comments)   Positive on allergy test   Amoxicillin-pot Clavulanate Nausea Only   Did it involve swelling of the  face/tongue/throat, SOB, or low BP? No Did it involve sudden or severe rash/hives, skin peeling, or any reaction on the inside of your mouth or nose? No Did you need to seek medical attention at a hospital or doctor's office? Unknown When did it last happen?unknown If all above answers are "NO", may proceed with cephalosporin use.   Colesevelam Hcl Nausea Only   WELCHOL   Latex Rash   Family history of latex allergy   Simvastatin Itching      Medication List       Accurate as of May 30, 2019 11:59 PM. If you have any questions, ask your nurse or doctor.        STOP taking these medications   nitrofurantoin (macrocrystal-monohydrate) 100 MG capsule Commonly known as: Macrobid Stopped by: Hollice Espy, MD     TAKE these medications  acetaminophen 650 MG CR tablet Commonly known as: TYLENOL Take 650-1,300 mg by mouth every 8 (eight) hours as needed for pain.   Alpha-Lipoic Acid 100 MG Tabs Take 100 mg by mouth daily with lunch.   azelastine 0.1 % nasal spray Commonly known as: ASTELIN Place 1 spray into both nostrils daily. Use in each nostril as directed   B-12-SL 1000 MCG Subl Generic drug: Cyanocobalamin Place 500 mcg under the tongue 2 (two) times a day.   chlorzoxazone 500 MG tablet Commonly known as: PARAFON Take 0.5 tablets (250 mg total) by mouth 3 (three) times daily.   CoQ-10 100 MG Caps Take 100 mg by mouth daily.   CRANBERRY PO Take 1 capsule by mouth daily.   DHEA 10 MG Caps Take 10 mg by mouth daily.   EPINEPHrine 0.3 mg/0.3 mL Soaj injection Commonly known as: EPI-PEN   estradiol 0.1 MG/GM vaginal cream Commonly known as: ESTRACE VAGINAL Place 1 Applicatorful vaginally 3 (three) times a week. 0.5 twice weekly --COMPOUND PLS   fexofenadine 60 MG tablet Commonly known as: ALLEGRA Take 60 mg by mouth daily.   levothyroxine 75 MCG tablet Commonly known as: SYNTHROID TAKE ONE TABLET EACH MORNING BEFORE BREAKFAST   losartan 100  MG tablet Commonly known as: COZAAR TAKE ONE TABLET BY MOUTH EVERY DAY   LUTEIN-ZEAXANTHIN PO Take 1 tablet by mouth at bedtime.   multivitamin with minerals Tabs tablet Take 1 tablet by mouth every evening.   Probiotic Caps Take 1 capsule by mouth at bedtime.   progesterone (bulk) Powd Apply 1 application topically See admin instructions. 4% compound- topically   triamcinolone 55 MCG/ACT Aero nasal inhaler Commonly known as: Nasacort Allergy 24HR Place 2 sprays daily into the nose.   TURMERIC PO Take 1 tablet by mouth 2 (two) times a week.   Vitamin D3 50 MCG (2000 UT) Tabs Take 2,000 Units by mouth daily.       Allergies:  Allergies  Allergen Reactions  . Colesevelam Nausea And Vomiting, Nausea Only and Other (See Comments)    [facility] WELCHOL  . Levofloxacin Hives  . Benazepril Other (See Comments)  . Methylisothiazolinone Other (See Comments)    Positive on allergy test  . Thimerosal Other (See Comments)    Positive on allergy test  . Amoxicillin-Pot Clavulanate Nausea Only    Did it involve swelling of the face/tongue/throat, SOB, or low BP? No Did it involve sudden or severe rash/hives, skin peeling, or any reaction on the inside of your mouth or nose? No Did you need to seek medical attention at a hospital or doctor's office? Unknown When did it last happen?unknown If all above answers are "NO", may proceed with cephalosporin use.   . Colesevelam Hcl Nausea Only    WELCHOL  . Latex Rash    Family history of latex allergy  . Simvastatin Itching    Family History: Family History  Problem Relation Age of Onset  . Cancer Mother        lung  . CAD Mother   . Stroke Father   . Cancer Sister        breast  . Breast cancer Sister 36  . Varicose Veins Son   . Heart disease Maternal Grandmother   . Cancer Paternal Grandmother        throat  . Emphysema Paternal Grandfather   . Fibromyalgia Sister   . Brain cancer Sister   .  Breast cancer Paternal Aunt  Social History:  reports that she has never smoked. She has never used smokeless tobacco. She reports that she does not drink alcohol or use drugs.  ROS: UROLOGY Frequent Urination?: Yes Hard to postpone urination?: No Burning/pain with urination?: No Get up at night to urinate?: No Leakage of urine?: Yes Urine stream starts and stops?: No Trouble starting stream?: No Do you have to strain to urinate?: No Blood in urine?: No Urinary tract infection?: No Sexually transmitted disease?: No Injury to kidneys or bladder?: No Painful intercourse?: No Weak stream?: No Currently pregnant?: No Vaginal bleeding?: No Last menstrual period?: n  Gastrointestinal Nausea?: No Vomiting?: No Indigestion/heartburn?: No Diarrhea?: No Constipation?: No  Constitutional Fever: No Night sweats?: No Weight loss?: No Fatigue?: No  Skin Skin rash/lesions?: No Itching?: No  Eyes Blurred vision?: No Double vision?: No  Ears/Nose/Throat Sore throat?: No Sinus problems?: No  Hematologic/Lymphatic Swollen glands?: No Easy bruising?: No  Cardiovascular Leg swelling?: Yes Chest pain?: No  Respiratory Cough?: No Shortness of breath?: No  Endocrine Excessive thirst?: No  Musculoskeletal Back pain?: No Joint pain?: No  Neurological Headaches?: No Dizziness?: No  Psychologic Depression?: No Anxiety?: No  Physical Exam: BP (!) 186/72   Pulse 80   Ht 5\' 4"  (1.626 m)   Wt 161 lb (73 kg)   LMP  (LMP Unknown)   BMI 27.64 kg/m   Constitutional:  Alert and oriented, No acute distress. HEENT: [place] AT, moist mucus membranes.  Trachea midline, no masses. Cardiovascular: No clubbing, cyanosis, or edema. Respiratory: Normal respiratory effort, no increased work of breathing. Skin: No rashes, bruises or suspicious lesions. Neurologic: Grossly intact, no focal deficits, moving all 4 extremities. Psychiatric: Normal mood and affect.  Laboratory  Data: Lab Results  Component Value Date   WBC 6.8 04/03/2019   HGB 13.3 04/03/2019   HCT 41.4 04/03/2019   MCV 96 04/03/2019   PLT 209 04/03/2019    Lab Results  Component Value Date   CREATININE 1.07 (H) 04/03/2019    Assessment & Plan:    1. Recurrent UTI Recurrent urinary tract infections likely related to incomplete bladder emptying  The patient is adamantly against resuming clean intermittent catheterization.  She is currently being managed on cranberry tablets twice daily (discussed modification how she takes medication on empty stomach), daily probiotic and presumably intravaginal estrogen.  I would like her to call tomorrow with the name and dose of the vaginal cream that she is taking.  I have recommended that she increase use of this to 3 times per week and also use on her urethral meatus in addition to intravaginally which she is agreeable with.  She is no longer interested in taking methenamine and not interested in suppressive antibiotics.  Bowel hygiene and other hygiene issues seem appropriate.  Additional measures above and beyond this are relatively limited.  I recommended that she come in for same-day visits when she become symptomatic and we can treat these as they come.  Would only recommend treating symptomatic infections and not symptoms such as cloudy or foul-smelling urine.  I have offered pursuing upper tract imaging in the for renal ultrasound and/or KUB to rule out stones as concerning factors which she declined.  - Bladder Scan (Post Void Residual) in office  2. Incomplete bladder emptying Extensive history taking today as well as review of urodynamics and a voiding diary  It appears the patient is an abdominal voider and would benefit from either resumption of self cath or optimizing abdominal voiding  including timed and double voiding with Crede technique.  She was taught how to do this today.  In addition to above, work ask her to work with her  physical therapist on pelvic floor relaxation techniques in order to optimize bladder emptying.  She is agreeable this plan.  She also reports that she had some good benefit with acupuncture which I have encouraged her to continue if this is helpful.  3. Atrophic vaginitis Increase topical estrogen as above    Hollice Espy, MD  [facility] [address] [place], [place] [address] [phone]  I spent 60 min with this patient of which greater than 50% was spent in counseling and coordination of care with the patient.

## 2019-05-31 ENCOUNTER — Telehealth: Payer: Self-pay | Admitting: Urology

## 2019-05-31 ENCOUNTER — Encounter: Payer: Medicare Other | Admitting: Physical Therapy

## 2019-05-31 ENCOUNTER — Encounter: Payer: Self-pay | Admitting: Urology

## 2019-05-31 NOTE — Telephone Encounter (Signed)
Spoke with patient, did not need refill sent in, stated she had plenty will let us know when she needs it to be called in.

## 2019-05-31 NOTE — Telephone Encounter (Signed)
Pt called and wanted to let you know about her meds that she spoke to you about in office yesterday. 30 Estriol 1mg /gm 30 Progestrone 40 % cream Compounded at [facility] in [place].

## 2019-06-01 ENCOUNTER — Ambulatory Visit: Payer: Medicare Other | Admitting: Physical Therapy

## 2019-06-01 ENCOUNTER — Other Ambulatory Visit: Payer: Self-pay

## 2019-06-01 DIAGNOSIS — G8929 Other chronic pain: Secondary | ICD-10-CM

## 2019-06-01 DIAGNOSIS — M62838 Other muscle spasm: Secondary | ICD-10-CM

## 2019-06-01 DIAGNOSIS — R293 Abnormal posture: Secondary | ICD-10-CM | POA: Diagnosis not present

## 2019-06-01 DIAGNOSIS — M25511 Pain in right shoulder: Secondary | ICD-10-CM | POA: Diagnosis not present

## 2019-06-01 NOTE — Patient Instructions (Addendum)
Return to bed headboard L leg press for more stretching    __  Neck/ shoulder exercises: use rolled towel    Angel wings, palms up  10 reps  Head, shoulder. Elbow press 5 sec  Release, 5 reps    Standing,  Push into ground with legs  Hands back slightly,  Push into ground, not lean back but move shoulders slightly over toes, then raise arms, thumbs up up as a wall is behind you ( not like a hug)  10 reps

## 2019-06-01 NOTE — Therapy (Addendum)
[place] MAIN [facility] SERVICES [address] [place], [place], [address] Phone: [phone]   Fax:  [phone]  Physical Therapy Treatment / Progress Note reporting from 04/26/19  - 06/01/19   ( 10 visits)   Patient Details  Name: Julia Deleon MRN: [id number] Date of Birth: 1945-10-09 Referring Provider (PT): Dr. Maryan Puls   Encounter Date: 06/01/2019  PT End of Session - 06/01/19 1039    Visit Number  65    Date for PT Re-Evaluation  08/10/19   next progress note needs to be on Visit #64   Authorization Type  Medicare    PT Start Time  1000    PT Stop Time  1100    PT Time Calculation (min)  60 min    Activity Tolerance  Patient tolerated treatment well;No increased pain    Behavior During Therapy  WFL for tasks assessed/performed       Past Medical History:  Diagnosis Date  . Arthritis   . COPD (chronic obstructive pulmonary disease) ([place])   . Family history of adverse reaction to anesthesia    father was slow to wake up  . Fuch's endothelial dystrophy   . Hyperlipidemia   . Hypothyroidism   . PONV (postoperative nausea and vomiting)    slow to wake up and PONV    Past Surgical History:  Procedure Laterality Date  . APPENDECTOMY    . BACK SURGERY  09/26/2018   L4-5 PLIF by Dr. Arnoldo Morale  . CARDIAC CATHETERIZATION    . CHOLECYSTECTOMY    . EYE SURGERY    . FOOT SURGERY Right    pin removed left  . laser vein surgery    . NASAL SINUS SURGERY    . RIGHT/LEFT HEART CATH AND CORONARY ANGIOGRAPHY N/A 12/06/2017   Procedure: RIGHT/LEFT HEART CATH AND CORONARY ANGIOGRAPHY;  Surgeon: Yolonda Kida, MD;  Location: [place] [facility];  Service: Cardiovascular;  Laterality: N/A;  . TONSILLECTOMY      There were no vitals filed for this visit.  Subjective Assessment - 06/04/19 1756    Subjective  Pt had no increased pain with new exercises not after working with her fitness trainer this week    Limitations   Sitting;Walking;Standing         [facility] PT Assessment - 06/04/19 1756      Coordination   Gross Motor Movements are Fluid and Coordinated  --   upper cervical ext with rotation cues.      AROM   Overall AROM Comments  standing: stacked thorax over pelvis: ( still required manual cues to maintain otherwise pt shows compensation with hip thrust, shoulders posterior)  L ~130 deg, R ~120 deg before pain       Strength   Overall Strength Comments  hand on wall,  PF MMT 4/5 R 20 reps, L 18 reps       Palpation   Palpation comment  Increased tightness at SCM, levator, B  interspinals B upper thoracic, hypomobility at T3-5 R more deviated                    [facility] Adult PT Treatment/Exercise - 06/04/19 1756      Neuro Re-ed    Neuro Re-ed Details   cued for more co-activation on BLE, thoracic ext with shoulder abduction       Manual Therapy   Manual therapy comments  STM/MWM SCM, levator, B  interspinals B upper thoracic, hypomobility  at T3-5 R medial glide, inferior mob of clavicle B to promote more cervical retraction and less upper cervical ext                PT Long Term Goals - 06/01/19 1039      PT LONG TERM GOAL #1   Title  Patient will be able sit for 1 hour and apply loosening HEP and minimize pain that occurs by 50% 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 Will be able to cath without pain and will be cleared by physiscian to d/c  self-cath due to decreased residual volume with bladder emptying.    Baseline  required to cath 3 times per day, painful each time.    Time  10    Period  Weeks    Status  Achieved      PT LONG TERM GOAL #4   Title  Patient will score less than or equal to 40% on the Female NIH-CPSI and 30% on the [facility] to demonstrate a reduction in pain, urinary symptoms, and an improved quality of life.    Baseline  Female NIH-CPSI: 38/43 (88%) , PDI: 45/75 (60%); PDI 44/75 (59%) and Female NIH-CPSI: 27/43 (64%) on 02-08-19 , Female NIH-CPSI: 17% on 03-26-19    Time  10    Period  Weeks    Status  Achieved      PT LONG TERM GOAL #5   Title  Patient will demonstrate increased step length with reciprical arm swing, and demonstrate no scissoring steps.    Baseline  Pt currently demonstrated scissoring steppage on R LE, increased trunk rotation and deep core engagement with reciprical walking poles.    Time  10    Period  Weeks    Status  Achieved      Additional Long Term Goals   Additional Long Term Goals  Yes      PT LONG TERM GOAL #6   Title  Pt will report no radiating pain down LLE after walking treadmill for 10 min in order to progress with aerobic exercises    Baseline  after 7 min    Time  8    Period  Weeks    Status  On-going      PT LONG TERM GOAL #7   Title  Pt will report walking up > 2 mornings in a row with pain level < 5-6/10 in order to demo maintanence of Tx benefits and more balance of mm tensions achieved with HEP, shoe lift to improve QOL    Baseline  7-8/10 level and benefits from Tx lasted only one morning  (03-19-19 and 03-23-19)    Time  10  Period  Weeks    Status  Partially Met      PT LONG TERM GOAL #8   Title  Pt will be able to demo increased PF strength with unilateral UE support on wall from B 3/5 15 reps in order to achieve stronger push off in gait and walk longer distance    Baseline  B, L 7 reps  2/5, 10 reps R 3/5  ( 12/17: 15 reps R , 13 reps L, 06/01/19: R 20  reps, L 18 reps  )    Time  10    Period  Weeks    Status  Achieved      PT LONG TERM GOAL  #9   TITLE  Pt will demo shoulder abduction > 120 deg B with improved alignment of thorax over pelvis without manual cues in order to demo proper co-activation of B equal weight bearing in BLE, and decreased thoracic kyhposis/ decreased thoracic rotation 2/2 scoliosis in order to lift plates overhead cabinets/ decreased risk for    Time  10    Period  Weeks    Status  New    Target Date  08/13/19            Plan - 06/01/19 1039    Clinical Impression Statement  Pt has achieved 5/9 goals and is progressing well towards remaining goals regarding L lateral thigh tightness which impact her walking long distances/ treadmill.   Current improvements include: _increased L knee extension with more equal weight bearing on BLE/ feet  _decreased L gen valgus  _scoliosis mm imbalances and instrinic feet mm strength have improved _radiating L LE pain has resolved and pt reports more L lateral thigh tightness  Currently working to improve thoracic kyphosis / scapular mobility/ stabilization to improve R shoulder mobility ( Hx of frozen shoulder) . to perform PNF strengthening exercises without R shoulder pain. This exercise will help to minimize concavity of R lumbar scoliosis curve. Anticipate these improvements will help pt walk with less R trunk lean 2/2 scoliosis and thus increase her endurance and decrease L lateral thigh tightness.   Pt continues to benefit from skilled PT.     Personal Factors and Comorbidities  Age;Comorbidity 3+    Comorbidities  Osteoporosis, scoliosis, Arthritis, COPD, recent lumbosacral fusion for spondylolisthesis.    Examination-Activity Limitations  Toileting;Sit;Stand;Bend;Lift;Carry    Examination-Participation Restrictions  Interpersonal Relationship;Yard Work;Cleaning;Laundry    Stability/Clinical Decision Making  Unstable/Unpredictable    Rehab Potential  Good    PT  Frequency  2x / week    PT Duration  Other (comment)   20   PT Treatment/Interventions  ADLs/Self Care Home Management;Aquatic Therapy;Moist Heat;Electrical Stimulation;Traction;Therapeutic activities;Functional mobility training;Stair training;Gait training;Therapeutic exercise;Balance training;Neuromuscular re-education;Patient/family education;Manual techniques;Dry needling;Passive range of motion;Scar mobilization;Taping    Consulted and Agree with Plan of Care  Patient       Patient will benefit from skilled therapeutic intervention in order to improve the following deficits and impairments:  Abnormal gait, Decreased balance, Increased muscle spasms, Decreased range of motion, Decreased scar mobility, Improper body mechanics, Decreased coordination, Decreased strength, Increased fascial restricitons, Impaired flexibility, Postural dysfunction, Pain  Visit Diagnosis: Abnormal posture - Plan: PT plan of care cert/re-cert  Other muscle spasm - Plan: PT plan of care cert/re-cert  Chronic right shoulder pain     Problem List Patient Active Problem List   Diagnosis Date Noted  . Chronic UTI 12/06/2018  . Status post lumbar surgery 12/06/2018  . Spondylolisthesis, lumbar region 09/26/2018  .  Aortic atherosclerosis ([place]) 08/31/2018  . CAD (coronary artery disease) 02/23/2018  . Advanced care planning/counseling discussion 03/28/2017  . Varicose veins of both lower extremities with complications 39/07/90  . Arthritis 12/09/2016  . Vaginal atrophy 09/10/2015  . Menopausal hot flushes 09/10/2015  . Onychomycosis due to dermatophyte 04/07/2015  . Bursitis of right shoulder 02/26/2015  . Environmental and seasonal allergies 02/26/2015  . Other allergic rhinitis 02/26/2015  . Osteoporosis 11/26/2014  . COPD (chronic obstructive pulmonary disease) ([place]) 11/26/2014  . Fuchs' corneal dystrophy 11/26/2014  . Benign hypertensive renal disease 11/26/2014  . Hyperlipidemia 11/26/2014  .  Chronic kidney disease, stage III (moderate) ([place]) 11/26/2014  . Hypothyroidism 11/26/2014  . Endothelial corneal dystrophy 11/26/2014    Jerl Mina ,PT, DPT, E-RYT  06/05/2019, 12:13 PM  [place] MAIN [facility] SERVICES [address] [place], [place], [address] Phone: [phone]   Fax:  [phone]  Name: Emaley C Grealish MRN: [id number] Date of Birth: 1945-10-08

## 2019-06-03 DIAGNOSIS — Z23 Encounter for immunization: Secondary | ICD-10-CM | POA: Diagnosis not present

## 2019-06-04 NOTE — Addendum Note (Signed)
Addended by: Jerl Mina on: 06/04/2019 06:05 PM   Modules accepted: Orders

## 2019-06-05 ENCOUNTER — Other Ambulatory Visit: Payer: Self-pay

## 2019-06-05 ENCOUNTER — Ambulatory Visit: Payer: Medicare Other | Admitting: Physical Therapy

## 2019-06-05 DIAGNOSIS — M25511 Pain in right shoulder: Secondary | ICD-10-CM | POA: Diagnosis not present

## 2019-06-05 DIAGNOSIS — M62838 Other muscle spasm: Secondary | ICD-10-CM

## 2019-06-05 DIAGNOSIS — G8929 Other chronic pain: Secondary | ICD-10-CM

## 2019-06-05 DIAGNOSIS — R293 Abnormal posture: Secondary | ICD-10-CM | POA: Diagnosis not present

## 2019-06-05 NOTE — Patient Instructions (Signed)
Lay band down like a U     band under ballmounds  while laying on back w/ knees bent  "W" exercise  10 reps x 2 sets   Band is placed under feet, knees bent, feet are hip width apart Hold band with thumbs point out, keep upper arm and elbow touching the bed the whole time  - inhale and then exhale pull bands by bending elbows hands move in a "w"  (feel shoulder blades squeezing)    __    Do the head, shoulder, elbow press seated

## 2019-06-05 NOTE — Therapy (Signed)
[place] MAIN [facility] SERVICES [address] [place], [place], [address] Phone: [phone]   Fax:  [phone]  Physical Therapy Treatment  Patient Details  Name: Julia Deleon MRN: [id number] Date of Birth: 01/19/1946 Referring Provider (PT): Dr. Maryan Puls   Encounter Date: 06/05/2019  PT End of Session - 06/05/19 1219    Visit Number  44    Date for PT Re-Evaluation  08/10/19   next progress note needs to be on Visit #64   Authorization Type  Medicare    PT Start Time  1105    PT Stop Time  1215    PT Time Calculation (min)  70 min    Activity Tolerance  Patient tolerated treatment well;No increased pain    Behavior During Therapy  WFL for tasks assessed/performed       Past Medical History:  Diagnosis Date  . Arthritis   . COPD (chronic obstructive pulmonary disease) ([place])   . Family history of adverse reaction to anesthesia    father was slow to wake up  . Fuch's endothelial dystrophy   . Hyperlipidemia   . Hypothyroidism   . PONV (postoperative nausea and vomiting)    slow to wake up and PONV    Past Surgical History:  Procedure Laterality Date  . APPENDECTOMY    . BACK SURGERY  09/26/2018   L4-5 PLIF by Dr. Arnoldo Morale  . CARDIAC CATHETERIZATION    . CHOLECYSTECTOMY    . EYE SURGERY    . FOOT SURGERY Right    pin removed left  . laser vein surgery    . NASAL SINUS SURGERY    . RIGHT/LEFT HEART CATH AND CORONARY ANGIOGRAPHY N/A 12/06/2017   Procedure: RIGHT/LEFT HEART CATH AND CORONARY ANGIOGRAPHY;  Surgeon: Yolonda Kida, MD;  Location: [place] [facility];  Service: Cardiovascular;  Laterality: N/A;  . TONSILLECTOMY      There were no vitals filed for this visit.  Subjective Assessment - 06/05/19 1112    Subjective  Pt reported the walk from teh parking lot and then the detour to treatment room caused the ITband to tighten. The shoulder exercises are going fair.    Limitations  Sitting;Walking;Standing          [facility] PT Assessment - 06/05/19 1113      Observation/Other Assessments   Observations  height 63" without cues for thorac ext/ scap retraction,  64 1/4" with cues        AROM   Overall AROM Comments  standing without leaning back:  L shoulder abduction 115 deg, R 105 deg  ( post Tx: 110 deg R)        Palpation   Palpation comment  tightenss anterior intercostals 10-12th rib, interspinals R ( at T7-10 point tenderness, referred tightenss to L lateral thigh to knee  , teres minor R  with point tenderness ( post Tx : decreased tightness, no point tenderness/ referred tightenss to L lateral thigh to knee)                     [facility] Adult PT Treatment/Exercise - 06/05/19 1444      Neuro Re-ed    Neuro Re-ed Details   cued for thoracic ext, scap retraction/ depression in new HEP       Manual Therapy   Manual therapy comments  STM/ MWM at anterior intercostals 10-12th rib, interspinals R , teres minor R  PT Short Term Goals - 04/26/19 1307      PT SHORT TERM GOAL #1   Title  Patient will demonstrate improved pelvic alignment and balance of musculature surrounding the pelvis to facilitate decreased PFM spasms and decrease pelvic pain.    Baseline  Pt. demonstrates severe posterior pelvic tilt, L thoracic/R lumbar scoliosis, and R anterior/L posterior pelvic obliquity. Pt. has demonstrated improved pelvic alignment and posture but has not been able to sustain long-term due to continued lack of postural strength.    Time  5    Period  Weeks    Status  Achieved    Target Date  03/06/19      PT SHORT TERM GOAL #2   Title  Patient will demonstrate a coordinated contraction, relaxation, and bulge of the pelvic floor muscles to demonstrate functional recruitment and motion and allow for bladder emptying.    Baseline  Pt. is unable to fully empty bladder and describes pain with catheterization, demonstrating high tone of PFM.    Time  5    Period  Weeks     Status  Achieved    Target Date  03/06/19      PT SHORT TERM GOAL #3   Title  Patient will demonstrate improved sitting and standing posture to demonstrate learning and decrease stress on the pelvic floor with functional activity.    Baseline  Pt. demonstrates severe posterior pelvic tilt, L thoracic/R lumbar scoliosis, and R anterior/L posterior pelvic obliquity. 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 Will be able to cath without pain and will be cleared by physiscian to d/c self-cath due to decreased residual volume with bladder emptying.    Baseline  required to cath 3 times per day, painful each time.    Time  10    Period  Weeks    Status  Achieved      PT LONG TERM GOAL #4   Title  Patient will score less than or equal to 40% on the Female NIH-CPSI and 30% on the [facility] to demonstrate a reduction in pain, urinary symptoms, and an improved quality of life.  Baseline  Female NIH-CPSI: 38/43 (88%) , PDI: 45/75 (60%); PDI 44/75 (59%) and Female NIH-CPSI: 27/43 (64%) on 02-08-19 , Female  NIH-CPSI: 17% on 03-26-19    Time  10    Period  Weeks    Status  Achieved      PT LONG TERM GOAL #5   Title  Patient will demonstrate increased step length with reciprical arm swing, and demonstrate no scissoring steps.    Baseline  Pt currently demonstrated scissoring steppage on R LE, increased trunk rotation and deep core engagement with reciprical walking poles.    Time  10    Period  Weeks    Status  Achieved      Additional Long Term Goals   Additional Long Term Goals  Yes      PT LONG TERM GOAL #6   Title  Pt will report no radiating pain down LLE after walking treadmill for 10 min in order to progress with aerobic exercises    Baseline  after 7 min    Time  8    Period  Weeks    Status  On-going      PT LONG TERM GOAL #7   Title  Pt will report walking up > 2 mornings in a row with pain level < 5-6/10 in order to demo maintanence of Tx benefits and more balance of mm tensions achieved with HEP, shoe lift to improve QOL    Baseline  7-8/10 level and benefits from Tx lasted only one morning  (03-19-19 and 03-23-19)    Time  10    Period  Weeks    Status  Partially Met      PT LONG TERM GOAL #8   Title  Pt will be able to demo increased PF strength with unilateral UE support on wall from B 3/5 15 reps in order to achieve stronger push off in gait and walk longer distance    Baseline  B, L 7 reps  2/5, 10 reps R 3/5  ( 12/17: 15 reps R , 13 reps L, 06/01/19: R 20 reps, L 18 reps  )    Time  10    Period  Weeks    Status  Achieved      PT LONG TERM GOAL  #9   TITLE  Pt will demo shoulder abduction > 120 deg B with improved alignment of thorax over pelvis without manual cues in order to demo proper co-activation of B equal weight bearing in BLE, and decreased thoracic kyhposis/ decreased thoracic rotation 2/2 scoliosis in order to lift plates overhead cabinets/ decreased risk for    Time  10    Period  Weeks    Status  New      PT LONG TERM GOAL  #10   TITLE  Pt will demo  increased height from 63" to   64 1/4" and maintain 64 1/4" across 3 weeks ( once a week measurement)  in order to be ready to progress to PNF exercise with less shoulder pain and lengthen/ strengthen concave curve on R 2/2 scoliosis to minimize L lateral thigh tightness    Time  8    Period  Weeks    Status  New    Target Date  07/31/19            Plan - 06/05/19 1451    Clinical Impression Statement  Pt demo'd increased R shoulder abduction from 105 deg to 115 deg today after manual Tx.  Point tenderness at  T7-10 point interespinal, referred tightness to L lateral thigh to knee as suspected with contralateral fascial connection from upper to lower body 2/2 scoliosis. Post Tx, point tenderness and referred tightness to L thigh was absent, signifying improvement. Anticipate improvements in thoracic extension, scapular retraction/ depression, intercostal mm mobility will help pt be ready for PNF strengthening exercise with no shoulder pain and fuirther help minimze shortening of concave curve at L lumbar spine/ R trunk lean which in turn will help minimize L thigh / IT band tightness. Progressed with scapular thoracolumbar strengthening in gravity eliminated position. Pt continues to benefit from skilled PT.     Personal Factors and Comorbidities  Age;Comorbidity 3+    Comorbidities  Osteoporosis, scoliosis, Arthritis, COPD, recent lumbosacral fusion for spondylolisthesis.    Examination-Activity Limitations  Toileting;Sit;Stand;Bend;Lift;Carry    Examination-Participation Restrictions  Interpersonal Relationship;Yard Work;Cleaning;Laundry    Stability/Clinical Decision Making  Unstable/Unpredictable    Rehab Potential  Good    PT Frequency  2x / week    PT Duration  Other (comment)   20   PT Treatment/Interventions  ADLs/Self Care Home Management;Aquatic Therapy;Moist Heat;Electrical Stimulation;Traction;Therapeutic activities;Functional mobility training;Stair training;Gait  training;Therapeutic exercise;Balance training;Neuromuscular re-education;Patient/family education;Manual techniques;Dry needling;Passive range of motion;Scar mobilization;Taping    Consulted and Agree with Plan of Care  Patient       Patient will benefit from skilled therapeutic intervention in order to improve the following deficits and impairments:  Abnormal gait, Decreased balance, Increased muscle spasms, Decreased range of motion, Decreased scar mobility, Improper body mechanics, Decreased coordination, Decreased strength, Increased fascial restricitons, Impaired flexibility, Postural dysfunction, Pain  Visit Diagnosis: Abnormal posture  Other muscle spasm  Chronic right shoulder pain     Problem List Patient Active Problem List   Diagnosis Date Noted  . Chronic UTI 12/06/2018  . Status post lumbar surgery 12/06/2018  . Spondylolisthesis, lumbar region 09/26/2018  . Aortic atherosclerosis ([place]) 08/31/2018  . CAD (coronary artery disease) 02/23/2018  . Advanced care planning/counseling discussion 03/28/2017  . Varicose veins of both lower extremities with complications 29/79/8921  . Arthritis 12/09/2016  . Vaginal atrophy 09/10/2015  . Menopausal hot flushes 09/10/2015  . Onychomycosis due to dermatophyte 04/07/2015  . Bursitis of right shoulder 02/26/2015  . Environmental and seasonal allergies 02/26/2015  . Other allergic rhinitis 02/26/2015  . Osteoporosis 11/26/2014  . COPD (chronic obstructive pulmonary disease) ([place]) 11/26/2014  . Fuchs' corneal dystrophy 11/26/2014  . Benign hypertensive renal disease 11/26/2014  . Hyperlipidemia 11/26/2014  . Chronic kidney disease, stage III (moderate) ([place]) 11/26/2014  . Hypothyroidism 11/26/2014  . Endothelial corneal dystrophy 11/26/2014    Jerl Mina ,PT, DPT, E-RYT  06/05/2019, 2:51 PM  [place] MAIN [facility] SERVICES [address] [place], [place], [address] Phone:  [phone]   Fax:  [phone]  Name: Julia Deleon MRN: [id number] Date of Birth: 01-04-1946

## 2019-06-05 NOTE — Addendum Note (Signed)
Addended by: Jerl Mina on: 06/05/2019 12:15 PM   Modules accepted: Orders

## 2019-06-07 ENCOUNTER — Encounter: Payer: Medicare Other | Admitting: Physical Therapy

## 2019-06-08 ENCOUNTER — Other Ambulatory Visit: Payer: Self-pay

## 2019-06-08 ENCOUNTER — Ambulatory Visit: Payer: Medicare Other | Admitting: Physical Therapy

## 2019-06-08 DIAGNOSIS — M25511 Pain in right shoulder: Secondary | ICD-10-CM | POA: Diagnosis not present

## 2019-06-08 DIAGNOSIS — R293 Abnormal posture: Secondary | ICD-10-CM | POA: Diagnosis not present

## 2019-06-08 DIAGNOSIS — G8929 Other chronic pain: Secondary | ICD-10-CM

## 2019-06-08 DIAGNOSIS — M62838 Other muscle spasm: Secondary | ICD-10-CM | POA: Diagnosis not present

## 2019-06-08 NOTE — Therapy (Signed)
[place] MAIN [facility] SERVICES [address] [place], [place], [address] Phone: [phone]   Fax:  [phone]  Physical Therapy Treatment  Patient Details  Name: Julia Deleon MRN: [id number] Date of Birth: [birth year] Referring Provider (PT): Dr. Maryan Puls   Encounter Date: 06/08/2019  PT End of Session - 06/08/19 1132    Visit Number  [id number]    Date for PT Re-Evaluation  08/10/19   next progress note needs to be on Visit #64   Authorization Type  Medicare    PT Start Time  1004    PT Stop Time  1100    PT Time Calculation (min)  56 min    Activity Tolerance  Patient tolerated treatment well;No increased pain    Behavior During Therapy  WFL for tasks assessed/performed       Past Medical History:  Diagnosis Date  . Arthritis   . COPD (chronic obstructive pulmonary disease) ([place])   . Family history of adverse reaction to anesthesia    father was slow to wake up  . Fuch's endothelial dystrophy   . Hyperlipidemia   . Hypothyroidism   . PONV (postoperative nausea and vomiting)    slow to wake up and PONV    Past Surgical History:  Procedure Laterality Date  . APPENDECTOMY    . BACK SURGERY  09/26/2018   L4-5 PLIF by Dr. Arnoldo Morale  . CARDIAC CATHETERIZATION    . CHOLECYSTECTOMY    . EYE SURGERY    . FOOT SURGERY Right    pin removed left  . laser vein surgery    . NASAL SINUS SURGERY    . RIGHT/LEFT HEART CATH AND CORONARY ANGIOGRAPHY N/A 12/06/2017   Procedure: RIGHT/LEFT HEART CATH AND CORONARY ANGIOGRAPHY;  Surgeon: Yolonda Kida, MD;  Location: [place] [facility];  Service: Cardiovascular;  Laterality: N/A;  . TONSILLECTOMY      There were no vitals filed for this visit.  Subjective Assessment - 06/08/19 1014    Subjective  Pt felt okay after last session but felt sore in her R shoulder for 2 days.    Limitations  Sitting;Walking;Standing         [facility] PT Assessment - 06/08/19 1014      AROM   Overall AROM  Comments  standing without leaning back: B  125 deg       Palpation   Palpation comment  tightness at subscapularis L, T10-T12 deviated to L, interspinal L tightness ( post Tx: improved)        Ambulation/Gait   Gait Comments  more thoracic ext in gait . pre Tx: slight R trunk lean.  Post Tx: less R trunk lean, limited L thorax posterior rotation                     [facility] Adult PT Treatment/Exercise - 06/08/19 1014      Moist Heat Therapy   Number Minutes Moist Heat  5 Minutes    Moist Heat Location  Shoulder;Cervical      Manual Therapy   Manual therapy comments  STM/MWM subscapularis L, T10-T12 deviated to L, interspinal L               PT Short Term Goals - 06/08/19 1133      PT SHORT TERM GOAL #1   Title  Patient will demonstrate improved pelvic alignment and balance of musculature surrounding the pelvis to facilitate decreased PFM spasms and decrease pelvic  pain.    Baseline  Pt. demonstrates severe posterior pelvic tilt, L thoracic/R lumbar scoliosis, and R anterior/L posterior pelvic obliquity. Pt. has demonstrated improved pelvic alignment and posture but has not been able to sustain long-term due to continued lack of postural strength.    Time  5    Period  Weeks    Status  Achieved    Target Date  03/06/19      PT SHORT TERM GOAL #2   Title  Patient will demonstrate a coordinated contraction, relaxation, and bulge of the pelvic floor muscles to demonstrate functional recruitment and motion and allow for bladder emptying.    Baseline  Pt. is unable to fully empty bladder and describes pain with catheterization, demonstrating high tone of PFM.    Time  5    Period  Weeks    Status  Achieved    Target Date  03/06/19      PT SHORT TERM GOAL #3   Title  Patient will demonstrate improved sitting and standing posture to demonstrate learning and decrease stress on the pelvic floor with functional activity.    Baseline  Pt. demonstrates severe posterior  pelvic tilt, L thoracic/R lumbar scoliosis, and R anterior/L posterior pelvic obliquity. 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 Will be able to cath without pain and will be cleared by physiscian to d/c self-cath due to decreased residual volume with bladder emptying.    Baseline  required to cath 3 times per day, painful each time.    Time  10    Period  Weeks    Status  Achieved      PT LONG TERM GOAL #4   Title  Patient will score less than or equal to 40% on the Female NIH-CPSI and 30% on the [facility] to demonstrate a reduction in pain, urinary symptoms, and an improved quality of life.    Baseline  Female NIH-CPSI: 38/43 (88%) , PDI: 45/75 (60%); PDI 44/75 (59%) and Female NIH-CPSI: 27/43 (64%) on 02-08-19 , Female NIH-CPSI: 17% on 03-26-19    Time  10    Period  Weeks  Status  Achieved      PT LONG TERM GOAL #5   Title  Patient will demonstrate increased step length with reciprical arm swing, and demonstrate no scissoring steps.    Baseline  Pt currently demonstrated scissoring steppage  on R LE, increased trunk rotation and deep core engagement with reciprical walking poles.    Time  10    Period  Weeks    Status  Achieved      Additional Long Term Goals   Additional Long Term Goals  Yes      PT LONG TERM GOAL #6   Title  Pt will report no radiating pain down LLE after walking treadmill for 10 min in order to progress with aerobic exercises    Baseline  after 7 min    Time  8    Period  Weeks    Status  On-going      PT LONG TERM GOAL #7   Title  Pt will report walking up > 2 mornings in a row with pain level < 5-6/10 in order to demo maintanence of Tx benefits and more balance of mm tensions achieved with HEP, shoe lift to improve QOL    Baseline  7-8/10 level and benefits from Tx lasted only one morning  (03-19-19 and 03-23-19)    Time  10    Period  Weeks    Status  Partially Met      PT LONG TERM GOAL #8   Title  Pt will be able to demo increased PF strength with unilateral UE support on wall from B 3/5 15 reps in order to achieve stronger push off in gait and walk longer distance    Baseline  B, L 7 reps  2/5, 10 reps R 3/5  ( 12/17: 15 reps R , 13 reps L, 06/01/19: R 20 reps, L 18 reps  )    Time  10    Period  Weeks    Status  Achieved      PT LONG TERM GOAL  #9   TITLE  Pt will demo shoulder abduction > 120 deg B with improved alignment of thorax over pelvis without manual cues in order to demo proper co-activation of B equal weight bearing in BLE, and decreased thoracic kyhposis/ decreased thoracic rotation 2/2 scoliosis in order to lift plates overhead cabinets/ decreased risk for    Time  10    Period  Weeks    Status  New      PT LONG TERM GOAL  #10   TITLE  Pt will demo increased height from 63" to   64 1/4" and maintain 64 1/4" across 3 weeks ( once a week measurement)  in order to be ready to progress to PNF exercise with less shoulder pain and lengthen/ strengthen concave curve on R 2/2 scoliosis to minimize L lateral thigh tightness    Time  8     Period  Weeks    Status  New    Target Date  07/31/19            Plan - 06/08/19 1133    Clinical Impression Statement  Pt over the past 2 sessions, pt's shoulder abduction on R has increased over the past visits. Today pt reaching from 90 deg to 120 deg. Pt showed more thoracic extension. Noted pt has less R trunk lean in gait but there is a lack of L thorax rotation in posterior direction in with L preswing. Plan  to reintroduce PNF exercise to promote more L thoracic rotation in order to help her gait mechanics for less tightness on L thigh and to advance further to walk longer distances. Pt continues to benefit from skilled PT   Personal Factors and Comorbidities  Age;Comorbidity 3+    Comorbidities  Osteoporosis, scoliosis, Arthritis, COPD, recent lumbosacral fusion for spondylolisthesis.    Examination-Activity Limitations  Toileting;Sit;Stand;Bend;Lift;Carry    Examination-Participation Restrictions  Interpersonal Relationship;Yard Work;Cleaning;Laundry    Stability/Clinical Decision Making  Unstable/Unpredictable    Rehab Potential  Good    PT Frequency  2x / week    PT Duration  Other (comment)   20   PT Treatment/Interventions  ADLs/Self Care Home Management;Aquatic Therapy;Moist Heat;Electrical Stimulation;Traction;Therapeutic activities;Functional mobility training;Stair training;Gait training;Therapeutic exercise;Balance training;Neuromuscular re-education;Patient/family education;Manual techniques;Dry needling;Passive range of motion;Scar mobilization;Taping    Consulted and Agree with Plan of Care  Patient       Patient will benefit from skilled therapeutic intervention in order to improve the following deficits and impairments:  Abnormal gait, Decreased balance, Increased muscle spasms, Decreased range of motion, Decreased scar mobility, Improper body mechanics, Decreased coordination, Decreased strength, Increased fascial restricitons, Impaired flexibility, Postural  dysfunction, Pain  Visit Diagnosis: Abnormal posture  Other muscle spasm  Chronic right shoulder pain     Problem List Patient Active Problem List   Diagnosis Date Noted  . Chronic UTI 12/06/2018  . Status post lumbar surgery 12/06/2018  . Spondylolisthesis, lumbar region 09/26/2018  . Aortic atherosclerosis ([place]) 08/31/2018  . CAD (coronary artery disease) 02/23/2018  . Advanced care planning/counseling discussion 03/28/2017  . Varicose veins of both lower extremities with complications 34/37/3578  . Arthritis 12/09/2016  . Vaginal atrophy 09/10/2015  . Menopausal hot flushes 09/10/2015  . Onychomycosis due to dermatophyte 04/07/2015  . Bursitis of right shoulder 02/26/2015  . Environmental and seasonal allergies 02/26/2015  . Other allergic rhinitis 02/26/2015  . Osteoporosis 11/26/2014  . COPD (chronic obstructive pulmonary disease) ([place]) 11/26/2014  . Fuchs' corneal dystrophy 11/26/2014  . Benign hypertensive renal disease 11/26/2014  . Hyperlipidemia 11/26/2014  . Chronic kidney disease, stage III (moderate) ([place]) 11/26/2014  . Hypothyroidism 11/26/2014  . Endothelial corneal dystrophy 11/26/2014    Jerl Mina ,PT, DPT, E-RYT  06/08/2019, 1:33 PM  [place] MAIN [facility] SERVICES [address] [place], [place], [address] Phone: [phone]   Fax:  [phone]  Name: Julia Deleon MRN: [id number] Date of Birth: 10/31/1945

## 2019-06-12 ENCOUNTER — Other Ambulatory Visit: Payer: Self-pay

## 2019-06-12 ENCOUNTER — Ambulatory Visit: Payer: Medicare Other | Attending: Urology | Admitting: Physical Therapy

## 2019-06-12 DIAGNOSIS — M62838 Other muscle spasm: Secondary | ICD-10-CM | POA: Diagnosis not present

## 2019-06-12 DIAGNOSIS — R293 Abnormal posture: Secondary | ICD-10-CM | POA: Diagnosis not present

## 2019-06-12 DIAGNOSIS — M25511 Pain in right shoulder: Secondary | ICD-10-CM | POA: Insufficient documentation

## 2019-06-12 DIAGNOSIS — G8929 Other chronic pain: Secondary | ICD-10-CM | POA: Diagnosis not present

## 2019-06-12 DIAGNOSIS — R2689 Other abnormalities of gait and mobility: Secondary | ICD-10-CM | POA: Insufficient documentation

## 2019-06-12 NOTE — Patient Instructions (Signed)
Bedsheet

## 2019-06-13 NOTE — Therapy (Signed)
[place] MAIN [facility] SERVICES [address] [place], [place], [address] Phone: [phone]   Fax:  [phone]  Physical Therapy Treatment  Patient Details  Name: Julia Deleon MRN: [id number] Date of Birth: January 07, 1946 Referring Provider (PT): Dr. Maryan Puls   Encounter Date: 06/12/2019  PT End of Session - 06/13/19 1034    Visit Number  71    Date for PT Re-Evaluation  08/10/19   next progress note needs to be on Visit #64   Authorization Type  Medicare    PT Start Time  1104    PT Stop Time  7564    PT Time Calculation (min)  60 min    Activity Tolerance  Patient tolerated treatment well;No increased pain    Behavior During Therapy  WFL for tasks assessed/performed       Past Medical History:  Diagnosis Date  . Arthritis   . COPD (chronic obstructive pulmonary disease) ([place])   . Family history of adverse reaction to anesthesia    father was slow to wake up  . Fuch's endothelial dystrophy   . Hyperlipidemia   . Hypothyroidism   . PONV (postoperative nausea and vomiting)    slow to wake up and PONV    Past Surgical History:  Procedure Laterality Date  . APPENDECTOMY    . BACK SURGERY  09/26/2018   L4-5 PLIF by Dr. Arnoldo Morale  . CARDIAC CATHETERIZATION    . CHOLECYSTECTOMY    . EYE SURGERY    . FOOT SURGERY Right    pin removed left  . laser vein surgery    . NASAL SINUS SURGERY    . RIGHT/LEFT HEART CATH AND CORONARY ANGIOGRAPHY N/A 12/06/2017   Procedure: RIGHT/LEFT HEART CATH AND CORONARY ANGIOGRAPHY;  Surgeon: Yolonda Kida, MD;  Location: [place] [facility];  Service: Cardiovascular;  Laterality: N/A;  . TONSILLECTOMY      There were no vitals filed for this visit.  Subjective Assessment - 06/12/19 1115    Subjective  Shoulder felt sore but it went away after 2 days . Her neck was also stiff but that took 3 days to clear up.    Limitations  Sitting;Walking;Standing         [facility] PT Assessment - 06/12/19 1117       Other:   Other/ Comments  seated lat pull down without cue for cervical retraction,.but multifidis twist involved poor thoracic ext and therefore withheld this exercise       Posture/Postural Control   Posture Comments  17 cm from earlobe to wall ( forward head posture)       AROM   Overall AROM Comments  B 120 deg without compensations, with upright posture, holding 40 sec before fatigue.        Palpation   Spinal mobility  increased interspinals medial to scapular and intercostals medial to scapula R                    [facility] Adult PT Treatment/Exercise - 06/13/19 1033      Posture/Postural Control   Posture Comments  17 cm from earlobe to wall ( forward head posture)       Neuro Re-ed    Neuro Re-ed Details   cued for lat pull seated with more anterior lean, co-activationo f kinetic chain       Exercises   Exercises  Other Exercises    Other Exercises   see pt instructions  Moist Heat Therapy   Moist Heat Location  Shoulder;Cervical      Manual Therapy   Manual therapy comments  STM/MWM increased interspinals medial to scapular and intercostals medial to scapula R  to promote more scapular retraction on R and thoracic extensions                PT Short Term Goals - 06/08/19 1133      PT SHORT TERM GOAL #1   Title  Patient will demonstrate improved pelvic alignment and balance of musculature surrounding the pelvis to facilitate decreased PFM spasms and decrease pelvic pain.    Baseline  Pt. demonstrates severe posterior pelvic tilt, L thoracic/R lumbar scoliosis, and R anterior/L posterior pelvic obliquity. Pt. has demonstrated improved pelvic alignment and posture but has not been able to sustain long-term due to continued lack of postural strength.    Time  5    Period  Weeks    Status  Achieved    Target Date  03/06/19      PT SHORT TERM GOAL #2   Title  Patient will demonstrate a coordinated contraction, relaxation, and bulge of the  pelvic floor muscles to demonstrate functional recruitment and motion and allow for bladder emptying.    Baseline  Pt. is unable to fully empty bladder and describes pain with catheterization, demonstrating high tone of PFM.    Time  5    Period  Weeks    Status  Achieved    Target Date  03/06/19      PT SHORT TERM GOAL #3   Title  Patient will demonstrate improved sitting and standing posture to demonstrate learning and decrease stress on the pelvic floor with functional activity.    Baseline  Pt. demonstrates severe posterior pelvic tilt, L thoracic/R lumbar scoliosis, and R anterior/L posterior pelvic obliquity. 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 Will be able to cath without pain and will be cleared by physiscian to d/c self-cath due to decreased residual volume with bladder emptying.    Baseline  required to cath 3 times per day, painful each time.    Time  10    Period  Weeks    Status  Achieved      PT LONG TERM GOAL #4   Title  Patient will score less than or equal to 40% on  the Female NIH-CPSI and 30% on the [facility] to demonstrate a reduction in pain, urinary symptoms, and an improved quality of life.    Baseline  Female NIH-CPSI: 38/43 (88%) , PDI: 45/75 (60%); PDI 44/75 (59%) and Female NIH-CPSI: 27/43 (64%) on 02-08-19 , Female NIH-CPSI: 17% on 03-26-19    Time  10    Period  Weeks    Status  Achieved      PT LONG TERM GOAL #5   Title  Patient will demonstrate increased step length with reciprical arm swing, and demonstrate no scissoring steps.    Baseline  Pt currently demonstrated scissoring steppage on R LE, increased trunk rotation and deep core engagement with reciprical walking poles.    Time  10    Period  Weeks    Status  Achieved      PT LONG TERM GOAL #6   Title  Pt will report no radiating pain down LLE after walking treadmill for 10 min in order to progress with aerobic exercises    Baseline  after 7 min    Time  8    Period  Weeks    Status  On-going      PT LONG TERM GOAL #7   Title  Pt will report walking up > 2 mornings in a row with pain level < 5-6/10 in order to demo maintanence of Tx benefits and more balance of mm tensions achieved with HEP, shoe lift to improve QOL    Baseline  7-8/10 level and benefits from Tx lasted only one morning  (03-19-19 and 03-23-19)    Time  10    Period  Weeks    Status  Partially Met      PT LONG TERM GOAL #8   Title  Pt will be able to demo increased PF strength with unilateral UE support on wall from B 3/5 15 reps in order to achieve stronger push off in gait and walk longer distance    Baseline  B, L 7 reps  2/5, 10 reps R 3/5  ( 12/17: 15 reps R , 13 reps L, 06/01/19: R 20 reps, L 18 reps  )    Time  10    Period  Weeks    Status  Achieved      PT LONG TERM GOAL  #9   TITLE  Pt will demo shoulder abduction > 120 deg B with improved alignment of thorax over pelvis without manual cues in order to demo proper co-activation of B equal weight bearing in BLE, and decreased thoracic kyhposis/ decreased  thoracic rotation 2/2 scoliosis in order to lift plates overhead cabinets/ decreased risk for    Time  10    Period  Weeks    Status  Achieved      PT LONG TERM GOAL  #10   TITLE  Pt will demo increased height from 63" to   64 1/4" and maintain 64 1/4" across 3 weeks ( once a week measurement) and forward head posture improve ( decreased distance from wall to earlobe 17 cm from earlobe to wall to > 12 cm)  in order to be ready to progress to PNF exercise with less shoulder pain and lengthen/ strengthen concave curve on R 2/2 scoliosis to minimize  L lateral thigh tightness    Time  8    Period  Weeks    Status  Revised            Plan - 06/12/19 1120    Clinical Impression Statement  Pt is showing progress with more thoracic extension, scapular mobility with B 120 deg without compensations, with upright posture in addition to increased endurance with being able to holding this position 40 sec before fatigue. This is a significant improvement as it will help minimizie risk for thoracic fracture, relapse of shoulder injuries ( Hx of frozen shoulder ).   However, pt was not ready to return to PNF exercise yet due to shoulder pain.  Attempted with wall squat with scapular stabilization/cervical retraction but forward head position was a limiting factor and therefore, withheld this exercise as well. Manual Tx focused on increasing intercostal mobility on R thorax where concave curve exist 2/2 scoliosis. Also modified treatment to minimize neck achyness that had occurred after last session. Anticipate cervical endurance/ strengthening exercises can begin in antigravity positions at next session as thoracic mm imbalances improve. These type of exercises will help with aligning cervical/ thoracic spine above pelvis to help with thoracic rotation. Currently, pt has limited L shoulder/ thoracic rotation in gait which impacts her L lateral thigh tightness complaints). Pt continues to benefit from skilled  PT.     Personal Factors and Comorbidities  Age;Comorbidity 3+    Comorbidities  Osteoporosis, scoliosis, Arthritis, COPD, recent lumbosacral fusion for spondylolisthesis.    Examination-Activity Limitations  Toileting;Sit;Stand;Bend;Lift;Carry    Examination-Participation Restrictions  Interpersonal Relationship;Yard Work;Cleaning;Laundry    Stability/Clinical Decision Making  Unstable/Unpredictable    Rehab Potential  Good    PT Frequency  2x / week    PT Duration  Other (comment)   20   PT Treatment/Interventions  ADLs/Self Care Home Management;Aquatic Therapy;Moist Heat;Electrical Stimulation;Traction;Therapeutic activities;Functional mobility training;Stair training;Gait training;Therapeutic exercise;Balance training;Neuromuscular re-education;Patient/family education;Manual techniques;Dry needling;Passive range of motion;Scar mobilization;Taping    Consulted and Agree with Plan of Care  Patient       Patient will benefit from skilled therapeutic intervention in order to improve the following deficits and impairments:  Abnormal gait, Decreased balance, Increased muscle spasms, Decreased range of motion, Decreased scar mobility, Improper body mechanics, Decreased coordination, Decreased strength, Increased fascial restricitons, Impaired flexibility, Postural dysfunction, Pain  Visit Diagnosis: Abnormal posture  Other muscle spasm  Chronic right shoulder pain     Problem List Patient Active Problem List   Diagnosis Date Noted  . Chronic UTI 12/06/2018  . Status post lumbar surgery 12/06/2018  . Spondylolisthesis, lumbar region 09/26/2018  . Aortic atherosclerosis ([place]) 08/31/2018  . CAD (coronary artery disease) 02/23/2018  . Advanced care planning/counseling discussion 03/28/2017  . Varicose veins of both lower extremities with complications 92/03/9416  . Arthritis 12/09/2016  . Vaginal atrophy 09/10/2015  . Menopausal hot flushes 09/10/2015  . Onychomycosis due to  dermatophyte 04/07/2015  . Bursitis of right shoulder 02/26/2015  . Environmental and seasonal allergies 02/26/2015  . Other allergic rhinitis 02/26/2015  . Osteoporosis 11/26/2014  . COPD (chronic obstructive pulmonary disease) ([place]) 11/26/2014  . Fuchs' corneal dystrophy 11/26/2014  . Benign hypertensive renal disease 11/26/2014  . Hyperlipidemia 11/26/2014  . Chronic kidney disease, stage III (moderate) ([place]) 11/26/2014  . Hypothyroidism 11/26/2014  . Endothelial corneal dystrophy 11/26/2014    Jerl Mina ,PT, DPT, E-RYT  06/13/2019, 10:37 AM  [place] MAIN [facility] SERVICES [place],  [place], [address] Phone: [phone]   Fax:  [phone]  Name: Julia Deleon MRN: [id number] Date of Birth: Jan 13, 1946

## 2019-06-14 ENCOUNTER — Ambulatory Visit: Payer: Medicare Other | Admitting: Physical Therapy

## 2019-06-14 ENCOUNTER — Other Ambulatory Visit: Payer: Self-pay

## 2019-06-14 DIAGNOSIS — G8929 Other chronic pain: Secondary | ICD-10-CM

## 2019-06-14 DIAGNOSIS — M25511 Pain in right shoulder: Secondary | ICD-10-CM

## 2019-06-14 DIAGNOSIS — M62838 Other muscle spasm: Secondary | ICD-10-CM | POA: Diagnosis not present

## 2019-06-14 DIAGNOSIS — R2689 Other abnormalities of gait and mobility: Secondary | ICD-10-CM | POA: Diagnosis not present

## 2019-06-14 DIAGNOSIS — R293 Abnormal posture: Secondary | ICD-10-CM | POA: Diagnosis not present

## 2019-06-14 NOTE — Therapy (Signed)
[place] MAIN [facility] SERVICES [address] [place], [place], [address] Phone: [phone]   Fax:  [phone]  Physical Therapy Treatment  Patient Details  Name: Julia Deleon MRN: [id number] Date of Birth: [birth year] Referring Provider (PT): Dr. Maryan Puls   Encounter Date: 06/14/2019  PT End of Session - 06/14/19 1825    Visit Number  [id number]    Date for PT Re-Evaluation  08/10/19   next progress note needs to be on Visit #64   Authorization Type  Medicare    PT Start Time  1402    PT Stop Time  1500    PT Time Calculation (min)  58 min    Activity Tolerance  Patient tolerated treatment well;No increased pain    Behavior During Therapy  WFL for tasks assessed/performed       Past Medical History:  Diagnosis Date  . Arthritis   . COPD (chronic obstructive pulmonary disease) ([place])   . Family history of adverse reaction to anesthesia    father was slow to wake up  . Fuch's endothelial dystrophy   . Hyperlipidemia   . Hypothyroidism   . PONV (postoperative nausea and vomiting)    slow to wake up and PONV    Past Surgical History:  Procedure Laterality Date  . APPENDECTOMY    . BACK SURGERY  09/26/2018   L4-5 PLIF by Dr. Arnoldo Morale  . CARDIAC CATHETERIZATION    . CHOLECYSTECTOMY    . EYE SURGERY    . FOOT SURGERY Right    pin removed left  . laser vein surgery    . NASAL SINUS SURGERY    . RIGHT/LEFT HEART CATH AND CORONARY ANGIOGRAPHY N/A 12/06/2017   Procedure: RIGHT/LEFT HEART CATH AND CORONARY ANGIOGRAPHY;  Surgeon: Yolonda Kida, MD;  Location: [place] [facility];  Service: Cardiovascular;  Laterality: N/A;  . TONSILLECTOMY      There were no vitals filed for this visit.  Subjective Assessment - 06/14/19 1416    Subjective  Pt reports her L knee today is "killing her" walking in. Pt drove to [facility] yesterday and she thinks if anything triggered the L leg. Pt places her L foot on  the side sometimes and also switch to  placing her foot under the knee.  Her neck was sore after last session. Shoulder sore was "just a little"    Limitations  Sitting;Walking;Standing         [facility] PT Assessment - 06/14/19 1417      Sensation   Additional Comments  bilateral sensation at BLE        AROM   Overall AROM Comments  B 120 deg without compensations, with upright posture      Strength   Overall Strength Comments  hip abd R 3/5, L 4-/5, hip ext 4/5 B       Palpation   Palpation comment  increased tightness at R hamstring       Ambulation/Gait   Gait Comments  more L thorax posterior rotation with more arm swings. More reciporcal thoracic/ pelvis motion, increased hip extension L , more push off   ( without orthotic insoles)        Propped on elbows with hip abduction with no thoracic kyphosis but with R pelvic anterior rotation              [facility] Adult PT Treatment/Exercise - 06/14/19 1417      Therapeutic Activites    Other Therapeutic  Activities  assessed gait and shoulder mobility       Neuro Re-ed    Neuro Re-ed Details   cued for modified sideplank positions and alignment, clams        Manual Therapy   Manual therapy comments  STM/MWM at R hamstring/                PT Short Term Goals - 06/08/19 1133      PT SHORT TERM GOAL #1   Title  Patient will demonstrate improved pelvic alignment and balance of musculature surrounding the pelvis to facilitate decreased PFM spasms and decrease pelvic pain.    Baseline  Pt. demonstrates severe posterior pelvic tilt, L thoracic/R lumbar scoliosis, and R anterior/L posterior pelvic obliquity. Pt. has demonstrated improved pelvic alignment and posture but has not been able to sustain long-term due to continued lack of postural strength.    Time  5    Period  Weeks    Status  Achieved    Target Date  03/06/19      PT SHORT TERM GOAL #2   Title  Patient will demonstrate a coordinated contraction, relaxation, and bulge of the pelvic floor  muscles to demonstrate functional recruitment and motion and allow for bladder emptying.    Baseline  Pt. is unable to fully empty bladder and describes pain with catheterization, demonstrating high tone of PFM.    Time  5    Period  Weeks    Status  Achieved    Target Date  03/06/19      PT SHORT TERM GOAL #3   Title  Patient will demonstrate improved sitting and standing posture to demonstrate learning and decrease stress on the pelvic floor with functional activity.    Baseline  Pt. demonstrates severe posterior pelvic tilt, L thoracic/R lumbar scoliosis, and R anterior/L posterior pelvic obliquity. 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 Will be able to cath without pain and will be cleared by physiscian to d/c self-cath due to decreased residual volume with bladder emptying.    Baseline  required to cath 3 times per day, painful each time.  Time  10    Period  Weeks    Status  Achieved      PT LONG TERM GOAL #4   Title  Patient will score less than or equal to 40% on the Female  NIH-CPSI and 30% on the [facility] to demonstrate a reduction in pain, urinary symptoms, and an improved quality of life.    Baseline  Female NIH-CPSI: 38/43 (88%) , PDI: 45/75 (60%); PDI 44/75 (59%) and Female NIH-CPSI: 27/43 (64%) on 02-08-19 , Female NIH-CPSI: 17% on 03-26-19    Time  10    Period  Weeks    Status  Achieved      PT LONG TERM GOAL #5   Title  Patient will demonstrate increased step length with reciprical arm swing, and demonstrate no scissoring steps.    Baseline  Pt currently demonstrated scissoring steppage on R LE, increased trunk rotation and deep core engagement with reciprical walking poles.    Time  10    Period  Weeks    Status  Achieved      PT LONG TERM GOAL #6   Title  Pt will report no radiating pain down LLE after walking treadmill for 10 min in order to progress with aerobic exercises    Baseline  after 7 min    Time  8    Period  Weeks    Status  On-going      PT LONG TERM GOAL #7   Title  Pt will report walking up > 2 mornings in a row with pain level < 5-6/10 in order to demo maintanence of Tx benefits and more balance of mm tensions achieved with HEP, shoe lift to improve QOL    Baseline  7-8/10 level and benefits from Tx lasted only one morning  (03-19-19 and 03-23-19)    Time  10    Period  Weeks    Status  Partially Met      PT LONG TERM GOAL #8   Title  Pt will be able to demo increased PF strength with unilateral UE support on wall from B 3/5 15 reps in order to achieve stronger push off in gait and walk longer distance    Baseline  B, L 7 reps  2/5, 10 reps R 3/5  ( 12/17: 15 reps R , 13 reps L, 06/01/19: R 20 reps, L 18 reps  )    Time  10    Period  Weeks    Status  Achieved      PT LONG TERM GOAL  #9   TITLE  Pt will demo shoulder abduction > 120 deg B with improved alignment of thorax over pelvis without manual cues in order to demo proper co-activation of B equal weight bearing in BLE, and decreased thoracic kyhposis/ decreased thoracic  rotation 2/2 scoliosis in order to lift plates overhead cabinets/ decreased risk for    Time  10    Period  Weeks    Status  Achieved      PT LONG TERM GOAL  #10   TITLE  Pt will demo increased height from 63" to   64 1/4" and maintain 64 1/4" across 3 weeks ( once a week measurement) and forward head posture improve ( decreased distance from wall to earlobe 17 cm from earlobe to wall to > 12 cm)  in order to be ready to progress to PNF exercise with less shoulder pain and lengthen/ strengthen concave curve on R 2/2 scoliosis  to minimize L lateral thigh tightness    Time  8    Period  Weeks    Status  Revised            Plan - 06/14/19 1439    Clinical Impression Statement  Pt demonstrated today increased  hip abduction strength compared to Oct /16/20  R 2+/5 to 3-/5  L hip 3/5 to 4-/5   B hip ext 3/5 to 4/5   Initiated hip abduction on R today in sidelying and tried in CKC position but withheld.  Anticipate pt will have good outcome with equal strength of hip abductors. Pt demo'd more scapular stabilization when propped on elbows with hip abduction with no thoracic kyphosis but with R pelvic anterior rotation.   Reciporcal thorax/ pelvic movement has been restored as pt demonstrates less thoracic kyphosis./ scapular mobility. Attempted to guide pt to perform different modified side plank modifications but withheld them due to trunk rotation R which is the opposite of our goalPlan to select different exercise to address this weakness. Continue with more thoracolumbar strengthening and Plan to try multifidus twist seated position in a few weeks.   Pt reported increased pain with walking to clinic at L knee/ thigh but pt also reported she drove 1.5 hours yesterday. Discussed foot placement when driving and to try placing L foot under knee on floor board instead of on the side.  PT voiced understanding.  PT continues to benefit from skilled PT     Personal Factors and Comorbidities   Age;Comorbidity 3+    Comorbidities  Osteoporosis, scoliosis, Arthritis, COPD, recent lumbosacral fusion for spondylolisthesis.    Examination-Activity Limitations  Toileting;Sit;Stand;Bend;Lift;Carry    Examination-Participation Restrictions  Interpersonal Relationship;Yard Work;Cleaning;Laundry    Stability/Clinical Decision Making  Unstable/Unpredictable    Rehab Potential  Good    PT Frequency  2x / week    PT Duration  Other (comment)   20   PT Treatment/Interventions  ADLs/Self Care Home Management;Aquatic Therapy;Moist Heat;Electrical Stimulation;Traction;Therapeutic activities;Functional mobility training;Stair training;Gait training;Therapeutic exercise;Balance training;Neuromuscular re-education;Patient/family education;Manual techniques;Dry needling;Passive range of motion;Scar mobilization;Taping    Consulted and Agree with Plan of Care  Patient       Patient will benefit from skilled therapeutic intervention in order to improve the following deficits and impairments:  Abnormal gait, Decreased balance, Increased muscle spasms, Decreased range of motion, Decreased scar mobility, Improper body mechanics, Decreased coordination, Decreased strength, Increased fascial restricitons, Impaired flexibility, Postural dysfunction, Pain  Visit Diagnosis: Abnormal posture  Chronic right shoulder pain  Other muscle spasm     Problem List Patient Active Problem List   Diagnosis Date Noted  . Chronic UTI 12/06/2018  . Status post lumbar surgery 12/06/2018  . Spondylolisthesis, lumbar region 09/26/2018  . Aortic atherosclerosis ([place]) 08/31/2018  . CAD (coronary artery disease) 02/23/2018  . Advanced care planning/counseling discussion 03/28/2017  . Varicose veins of both lower extremities with complications 04/28/7587  . Arthritis 12/09/2016  . Vaginal atrophy 09/10/2015  . Menopausal hot flushes 09/10/2015  . Onychomycosis due to dermatophyte 04/07/2015  . Bursitis of right  shoulder 02/26/2015  . Environmental and seasonal allergies 02/26/2015  . Other allergic rhinitis 02/26/2015  . Osteoporosis 11/26/2014  . COPD (chronic obstructive pulmonary disease) ([place]) 11/26/2014  . Fuchs' corneal dystrophy 11/26/2014  . Benign hypertensive renal disease 11/26/2014  . Hyperlipidemia 11/26/2014  . Chronic kidney disease, stage III (moderate) ([place]) 11/26/2014  . Hypothyroidism 11/26/2014  . Endothelial corneal dystrophy 11/26/2014    Jerl Mina 06/14/2019, 6:26 PM  [place] MAIN [facility] SERVICES [address] [place], [place], [address] Phone: [phone]   Fax:  [phone]  Name: Julia Deleon MRN: [id number] Date of Birth: 1945-07-29

## 2019-06-14 NOTE — Patient Instructions (Addendum)
Discontinue the counter with the R arm   Just do    Clam Shell 45 Degrees   Lying with hips and knees bent 45, one pillow between knees and ankles. Lift knee with exhale. Be sure pelvis does not roll backward. Do not arch back. Do 30 times, R leg only .     ___  Remove orthotics this week

## 2019-06-15 ENCOUNTER — Encounter: Payer: Medicare Other | Admitting: Physical Therapy

## 2019-06-19 ENCOUNTER — Other Ambulatory Visit: Payer: Self-pay

## 2019-06-19 ENCOUNTER — Ambulatory Visit: Payer: Medicare Other | Admitting: Physical Therapy

## 2019-06-19 DIAGNOSIS — G8929 Other chronic pain: Secondary | ICD-10-CM | POA: Diagnosis not present

## 2019-06-19 DIAGNOSIS — M62838 Other muscle spasm: Secondary | ICD-10-CM | POA: Diagnosis not present

## 2019-06-19 DIAGNOSIS — M25511 Pain in right shoulder: Secondary | ICD-10-CM | POA: Diagnosis not present

## 2019-06-19 DIAGNOSIS — R293 Abnormal posture: Secondary | ICD-10-CM

## 2019-06-19 DIAGNOSIS — R2689 Other abnormalities of gait and mobility: Secondary | ICD-10-CM | POA: Diagnosis not present

## 2019-06-19 NOTE — Patient Instructions (Signed)
Mini squat/  PLEATS: BALLERINA   With hands on table, butt against edge     Heels together,  Knees bent pointed out like a "v" , navel ( center of mass) more forward  Heels together as you lift, pointed out like a "v"  KNEES ARE ALIGNED BEHIND THE TOES TO MINIMIZE STRAIN ON THE KNEES Lower heels down slow with your  navel ( center of mass) more forward to avoid dropping down fast and rocking more weight back onto heels   More weight towards L outer foot pinky side to lift arches   10 reps  ________  Standing with butt against edge of table   Push butt against table while pushing downward into both legs and feet    " Pulling a lawnmover"  L hand across body by R pocket  Press downward into the ballmounds and heels of the feet, thigh muscle active, Keep knees and hips squared the front and do not move them throughout the activity, Exhale to pull band from R pocket,  across the face to the R pocket, rotating only your ribcage to L as if "drawing a sword"    10 x 2 reps

## 2019-06-19 NOTE — Therapy (Signed)
[place] MAIN [facility] SERVICES [address] [place], [place], [address] Phone: [phone]   Fax:  [phone]  Physical Therapy Treatment   Patient Details  Name: Julia Deleon MRN: [id number] Date of Birth: [birth year] Referring Provider (PT): Dr. Maryan Puls   Encounter Date: 06/19/2019  PT End of Session - 06/19/19 1206    Visit Number  [id number]    Date for PT Re-Evaluation  08/10/19   next progress note needs to be on Visit #64   Authorization Type  Medicare    PT Start Time  1107    PT Stop Time  1220    PT Time Calculation (min)  73 min    Activity Tolerance  Patient tolerated treatment well;No increased pain    Behavior During Therapy  WFL for tasks assessed/performed       Past Medical History:  Diagnosis Date  . Arthritis   . COPD (chronic obstructive pulmonary disease) ([place])   . Family history of adverse reaction to anesthesia    father was slow to wake up  . Fuch's endothelial dystrophy   . Hyperlipidemia   . Hypothyroidism   . PONV (postoperative nausea and vomiting)    slow to wake up and PONV    Past Surgical History:  Procedure Laterality Date  . APPENDECTOMY    . BACK SURGERY  09/26/2018   L4-5 PLIF by Dr. Arnoldo Morale  . CARDIAC CATHETERIZATION    . CHOLECYSTECTOMY    . EYE SURGERY    . FOOT SURGERY Right    pin removed left  . laser vein surgery    . NASAL SINUS SURGERY    . RIGHT/LEFT HEART CATH AND CORONARY ANGIOGRAPHY N/A 12/06/2017   Procedure: RIGHT/LEFT HEART CATH AND CORONARY ANGIOGRAPHY;  Surgeon: Yolonda Kida, MD;  Location: [facility];  Service: Cardiovascular;  Laterality: N/A;  . TONSILLECTOMY      There were no vitals filed for this visit.  Subjective Assessment - 06/19/19 1108    Subjective  Pt went 4 days without her orthotics and her arches hurt some and the soles of the shoe felt less supportive.Pt had no increased L thigh tightness without the insoles. Pt notices R radiating thigh  pain to knee since removal of insoles.    Limitations  Sitting;Walking;Standing         [facility] PT Assessment - 06/20/19 0024      Observation/Other Assessments   Observations  lunge position with overhead movement with LOB       Coordination   Gross Motor Movements are Fluid and Coordinated  --   thoracic ext w/ shoulder abduction 120 deg     Other:   Other/ Comments   propped onforearms hip ext, scap retraction w.o cues, L2-3 concave    less thoracic kyphosis      Ambulation/Gait   Gait Comments  without insoles: R > l pelvic sway , less R shoulder lowering                     [facility] Adult PT Treatment/Exercise - 06/20/19 0024      Therapeutic Activites    Other Therapeutic Activities  discussed placing inserts back into shoes,       Neuro Re-ed    Neuro Re-ed Details   cued for spinal extension, propioception in new HEP                PT Short Term Goals - 06/08/19  [place] #1   Title  Patient will demonstrate improved pelvic alignment and balance of musculature surrounding the pelvis to facilitate decreased PFM spasms and decrease pelvic pain.    Baseline  Pt. demonstrates severe posterior pelvic tilt, L thoracic/R lumbar scoliosis, and R anterior/L posterior pelvic obliquity. Pt. has demonstrated improved pelvic alignment and posture but has not been able to sustain long-term due to continued lack of postural strength.    Time  5    Period  Weeks    Status  Achieved    Target Date  03/06/19      PT SHORT TERM GOAL #2   Title  Patient will demonstrate a coordinated contraction, relaxation, and bulge of the pelvic floor muscles to demonstrate functional recruitment and motion and allow for bladder emptying.    Baseline  Pt. is unable to fully empty bladder and describes pain with catheterization, demonstrating high tone of PFM.    Time  5    Period  Weeks    Status  Achieved    Target Date  03/06/19      PT SHORT TERM GOAL #3    Title  Patient will demonstrate improved sitting and standing posture to demonstrate learning and decrease stress on the pelvic floor with functional activity.    Baseline  Pt. demonstrates severe posterior pelvic tilt, L thoracic/R lumbar scoliosis, and R anterior/L posterior pelvic obliquity. 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 Will be able to cath without pain and will be cleared by physiscian to d/c self-cath due to decreased residual volume with bladder emptying.    Baseline  required to cath 3 times per day, painful each time.    Time  10    Period  Weeks    Status  Achieved      PT LONG TERM GOAL #4   Title  Patient will score less than or equal to 40% on the Female NIH-CPSI and 30% on the [facility] to demonstrate a reduction in pain, urinary symptoms, and an improved quality of life.    Baseline  Female NIH-CPSI:  38/43 (88%) , PDI: 45/75 (60%); PDI 44/75 (59%) and Female NIH-CPSI: 27/43 (64%) on 02-08-19 , Female NIH-CPSI: 17% on 03-26-19    Time  10    Period  Weeks    Status   Achieved      PT LONG TERM GOAL #5   Title  Patient will demonstrate increased step length with reciprical arm swing, and demonstrate no scissoring steps.    Baseline  Pt currently demonstrated scissoring steppage on R LE, increased trunk rotation and deep core engagement with reciprical walking poles.    Time  10    Period  Weeks    Status  Achieved      PT LONG TERM GOAL #6   Title  Pt will report no radiating pain down LLE after walking treadmill for 10 min in order to progress with aerobic exercises    Baseline  after 7 min    Time  8    Period  Weeks    Status  On-going      PT LONG TERM GOAL #7   Title  Pt will report walking up > 2 mornings in a row with pain level < 5-6/10 in order to demo maintanence of Tx benefits and more balance of mm tensions achieved with HEP, shoe lift to improve QOL    Baseline  7-8/10 level and benefits from Tx lasted only one morning  (03-19-19 and 03-23-19)    Time  10    Period  Weeks    Status  Partially Met      PT LONG TERM GOAL #8   Title  Pt will be able to demo increased PF strength with unilateral UE support on wall from B 3/5 15 reps in order to achieve stronger push off in gait and walk longer distance    Baseline  B, L 7 reps  2/5, 10 reps R 3/5  ( 12/17: 15 reps R , 13 reps L, 06/01/19: R 20 reps, L 18 reps  )    Time  10    Period  Weeks    Status  Achieved      PT LONG TERM GOAL  #9   TITLE  Pt will demo shoulder abduction > 120 deg B with improved alignment of thorax over pelvis without manual cues in order to demo proper co-activation of B equal weight bearing in BLE, and decreased thoracic kyhposis/ decreased thoracic rotation 2/2 scoliosis in order to lift plates overhead cabinets/ decreased risk for    Time  10    Period  Weeks    Status  Achieved      PT LONG TERM GOAL  #10   TITLE  Pt will demo increased height from 63" to   64 1/4" and maintain 64 1/4" across 3 weeks ( once a week measurement) and forward head posture  improve ( decreased distance from wall to earlobe 17 cm from earlobe to wall to > 12 cm)  in order to be ready to progress to PNF exercise with less shoulder pain and lengthen/ strengthen concave curve on R 2/2 scoliosis to minimize L lateral thigh tightness    Time  8    Period  Weeks    Status  Revised            Plan - 06/19/19 1207    Clinical Impression Statement Improvements: Pt reports she does still feel the pain down L thigh to the knee and sometimes lately down the R thigh (  more since taking the insole out) .  Pt has noticed she is not waking up in the morning with the pain as much by 25% less frequent. Pt is sleeping longer through the night with pain interrupting her only once throughout the night compared atleast 2 x night in the past. Pt reports she notices a milestone by how she can make the bed and the mattress corner does not slip out of her hand.    Pt achieved a milestone today with ability to utilize erector muscles in trunk/pelvic dissociation and standing modified pleat exercises  without resorting to thoracic kyphotic position. Today's standing positions were to continue increasing plantar arches and spinal extension with arm movements/ trunk rotation without shoulder pain.  Removal of orthotics the past few days caused R radiating thigh pain. Discussed placing them back into her shoes. Continued to incorporate guided relaxation practice into session. Explained pt is able to sleep longer less interruption of pain because thoracic kyphosis has decreased. Plan to continue to strengthen extensor mm.   Initiated CKC exercises to prep pt for functional activities in ways that strengthen thoracolumbar strengthening, promote more knee extension, more co-activation of transverse arch, strengthening plantar arches.   Areas to focus on:  The pain varies with activities ( loading laundry, driving long distances, sitting for long periods) . Plan to address body mechanics.  Pain  still occurs with walking and loaded activities but has not increased nor radiating past knee which is a positive sign.   Today,  pt's dynamic balance in lunge and overhead reaching was unsteady and will be further addressed at upcomings sessions for functional mobility and dynamic balance.    Pt benefits from skilled PT.     Personal Factors and Comorbidities  Age;Comorbidity 3+    Comorbidities  Osteoporosis, scoliosis, Arthritis, COPD, recent lumbosacral fusion for spondylolisthesis.    Examination-Activity Limitations  Toileting;Sit;Stand;Bend;Lift;Carry    Examination-Participation Restrictions  Interpersonal Relationship;Yard Work;Cleaning;Laundry    Stability/Clinical Decision Making  Unstable/Unpredictable    Rehab Potential  Good    PT Frequency  2x / week    PT Duration  Other (comment)   20   PT Treatment/Interventions  ADLs/Self Care Home Management;Aquatic Therapy;Moist Heat;Electrical Stimulation;Traction;Therapeutic activities;Functional mobility training;Stair training;Gait training;Therapeutic exercise;Balance training;Neuromuscular re-education;Patient/family education;Manual techniques;Dry needling;Passive range of motion;Scar mobilization;Taping    Consulted and Agree with Plan of Care  Patient       Patient will benefit from skilled therapeutic intervention in order to improve the following deficits and impairments:  Abnormal gait, Decreased balance, Increased muscle spasms, Decreased range of motion, Decreased scar mobility, Improper body mechanics, Decreased coordination, Decreased strength, Increased fascial restricitons, Impaired flexibility, Postural dysfunction, Pain  Visit Diagnosis: Abnormal posture  Chronic right shoulder pain  Other muscle spasm     Problem List Patient Active Problem List   Diagnosis Date Noted  . Chronic UTI 12/06/2018  . Status post lumbar surgery 12/06/2018  . Spondylolisthesis, lumbar region 09/26/2018  . Aortic  atherosclerosis ([place]) 08/31/2018  . CAD (coronary artery disease) 02/23/2018  . Advanced care planning/counseling discussion 03/28/2017  . Varicose veins of both lower extremities with complications 03/70/4888  . Arthritis 12/09/2016  . Vaginal atrophy 09/10/2015  . Menopausal hot flushes 09/10/2015  . Onychomycosis due to dermatophyte 04/07/2015  . Bursitis of right shoulder 02/26/2015  . Environmental and seasonal allergies 02/26/2015  . Other allergic rhinitis 02/26/2015  . Osteoporosis 11/26/2014  . COPD (chronic obstructive pulmonary disease) ([place]) 11/26/2014  . Fuchs' corneal dystrophy 11/26/2014  .  Benign hypertensive renal disease 11/26/2014  . Hyperlipidemia 11/26/2014  . Chronic kidney disease, stage III (moderate) ([place]) 11/26/2014  . Hypothyroidism 11/26/2014  . Endothelial corneal dystrophy 11/26/2014    Jerl Mina ,PT, DPT, E-RYT  06/20/2019, 12:28 AM  [place] MAIN [facility] SERVICES [address] [place], [place], [address] Phone: [phone]   Fax:  [phone]  Name: Julia Deleon MRN: [id number] Date of Birth: Aug 04, 1945

## 2019-06-21 ENCOUNTER — Other Ambulatory Visit: Payer: Self-pay

## 2019-06-21 NOTE — Telephone Encounter (Signed)
LOV: 05/07/2019 with Merrie Roof, PA-C Next OV: 10/02/2019 with Park Liter, DO

## 2019-06-22 ENCOUNTER — Encounter: Payer: Medicare Other | Admitting: Physical Therapy

## 2019-06-22 ENCOUNTER — Other Ambulatory Visit: Payer: Self-pay

## 2019-06-22 ENCOUNTER — Ambulatory Visit: Payer: Medicare Other | Admitting: Physical Therapy

## 2019-06-22 DIAGNOSIS — M62838 Other muscle spasm: Secondary | ICD-10-CM | POA: Diagnosis not present

## 2019-06-22 DIAGNOSIS — M25511 Pain in right shoulder: Secondary | ICD-10-CM | POA: Diagnosis not present

## 2019-06-22 DIAGNOSIS — G8929 Other chronic pain: Secondary | ICD-10-CM | POA: Diagnosis not present

## 2019-06-22 DIAGNOSIS — R2689 Other abnormalities of gait and mobility: Secondary | ICD-10-CM | POA: Diagnosis not present

## 2019-06-22 DIAGNOSIS — R293 Abnormal posture: Secondary | ICD-10-CM | POA: Diagnosis not present

## 2019-06-22 MED ORDER — CHLORZOXAZONE 500 MG PO TABS: 250.0000 mg | ORAL_TABLET | Freq: Three times a day (TID) | ORAL | 1 refills | Status: DC | PRN

## 2019-06-22 NOTE — Patient Instructions (Addendum)
Stretch for pelvic floor   V- slides  "v heels slide away and then back toward buttocks and then rock knee to slight ,  slide heel along at 11 o clock away from buttocks   10 reps  Only on the R

## 2019-06-22 NOTE — Therapy (Addendum)
[place] MAIN [facility] SERVICES [address] [place], [place], [address] Phone: [phone]   Fax:  [phone]  Physical Therapy Treatment  Patient Details  Name: Julia Deleon MRN: [id number] Date of Birth: [birth year] Referring Provider (PT): Dr. Maryan Puls   Encounter Date: 06/22/2019  PT End of Session - 06/22/19 1404    Visit Number  23    Date for PT Re-Evaluation  08/10/19   next progress note needs to be on Visit #64   Authorization Type  Medicare    PT Start Time  1287    PT Stop Time  1100    PT Time Calculation (min)  58 min    Activity Tolerance  Patient tolerated treatment well;No increased pain    Behavior During Therapy  WFL for tasks assessed/performed       Past Medical History:  Diagnosis Date  . Arthritis   . COPD (chronic obstructive pulmonary disease) ([place])   . Family history of adverse reaction to anesthesia    father was slow to wake up  . Fuch's endothelial dystrophy   . Hyperlipidemia   . Hypothyroidism   . PONV (postoperative nausea and vomiting)    slow to wake up and PONV    Past Surgical History:  Procedure Laterality Date  . APPENDECTOMY    . BACK SURGERY  09/26/2018   L4-5 PLIF by Dr. Arnoldo Morale  . CARDIAC CATHETERIZATION    . CHOLECYSTECTOMY    . EYE SURGERY    . FOOT SURGERY Right    pin removed left  . laser vein surgery    . NASAL SINUS SURGERY    . RIGHT/LEFT HEART CATH AND CORONARY ANGIOGRAPHY N/A 12/06/2017   Procedure: RIGHT/LEFT HEART CATH AND CORONARY ANGIOGRAPHY;  Surgeon: Yolonda Kida, MD;  Location: [place] [facility];  Service: Cardiovascular;  Laterality: N/A;  . TONSILLECTOMY      There were no vitals filed for this visit.  Subjective Assessment - 06/22/19 1012    Subjective  Pt placed her B orthotics insoles back in her shoes and she noticed her total back pain with radiating down B sides decreased. Pt had no problems with the new exercises. The L thigh tightness is not  occuring as much int the mornings and pt continues to sleep longer. The tightness is mosting with walking    Limitations  Sitting;Walking;Standing         [facility] PT Assessment - 06/22/19 1017      AROM   Overall AROM Comments  hip flex,ER/abd  R 67 cm to floor from lateral tib plateau, L 66 cm                 Pelvic Floor Special Questions - 06/22/19 1414    Pelvic Floor Internal Exam  pt consented without contraindications     Exam Type  Vaginal    Palpation  tightness and limited mobility at B 1-3 layers, bladder lowered position but behind pubic symphysis      Strength  Flicker  2/5        [facility] Adult PT Treatment/Exercise - 06/22/19 1413      Manual Therapy   Internal Pelvic Floor  STM/MWM and fascial glides at 6-12 o clock,  B obt int, tenderness at lateral wall of bladder B                PT Short Term Goals - 06/08/19 1133      PT SHORT  TERM GOAL #1   Title  Patient will demonstrate improved pelvic alignment and balance of musculature surrounding the pelvis to facilitate decreased PFM spasms and decrease pelvic pain.    Baseline  Pt. demonstrates severe posterior pelvic tilt, L thoracic/R lumbar scoliosis, and R anterior/L posterior pelvic obliquity. Pt. has demonstrated improved pelvic alignment and posture but has not been able to sustain long-term due to continued lack of postural strength.    Time  5    Period  Weeks    Status  Achieved    Target Date  03/06/19      PT SHORT TERM GOAL #2   Title  Patient will demonstrate a coordinated contraction, relaxation, and bulge of the pelvic floor muscles to demonstrate functional recruitment and motion and allow for bladder emptying.    Baseline  Pt. is unable to fully empty bladder and describes pain with catheterization, demonstrating high tone of PFM.    Time  5    Period  Weeks    Status  Achieved    Target Date  03/06/19      PT SHORT TERM GOAL #3   Title  Patient will demonstrate improved  sitting and standing posture to demonstrate learning and decrease stress on the pelvic floor with functional activity.    Baseline  Pt. demonstrates severe posterior pelvic tilt, L thoracic/R lumbar scoliosis, and R anterior/L posterior pelvic obliquity. 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 Will be able to cath without pain and will be cleared by physiscian to d/c self-cath due to decreased residual volume with bladder emptying.    Baseline  required to cath 3 times per day, painful each time.    Time  10    Period  Weeks    Status  Achieved      PT LONG TERM GOAL #4   Title  Patient will score less than or equal to 40% on the Female NIH-CPSI and 30% on the [facility] to demonstrate a reduction in pain, urinary symptoms, and an improved quality of life.    Baseline  Female NIH-CPSI: 38/43 (88%) , PDI: 45/75 (60%); PDI 44/75 (  59%) and Female NIH-CPSI: 27/43 (64%) on 02-08-19 , Female NIH-CPSI: 17% on 03-26-19    Time  10    Period  Weeks    Status  Achieved      PT LONG TERM GOAL #5   Title   Patient will demonstrate increased step length with reciprical arm swing, and demonstrate no scissoring steps.    Baseline  Pt currently demonstrated scissoring steppage on R LE, increased trunk rotation and deep core engagement with reciprical walking poles.    Time  10    Period  Weeks    Status  Achieved      PT LONG TERM GOAL #6   Title  Pt will report no radiating pain down LLE after walking treadmill for 10 min in order to progress with aerobic exercises    Baseline  after 7 min    Time  8    Period  Weeks    Status  On-going      PT LONG TERM GOAL #7   Title  Pt will report walking up > 2 mornings in a row with pain level < 5-6/10 in order to demo maintanence of Tx benefits and more balance of mm tensions achieved with HEP, shoe lift to improve QOL    Baseline  7-8/10 level and benefits from Tx lasted only one morning  (03-19-19 and 03-23-19)    Time  10    Period  Weeks    Status  Partially Met      PT LONG TERM GOAL #8   Title  Pt will be able to demo increased PF strength with unilateral UE support on wall from B 3/5 15 reps in order to achieve stronger push off in gait and walk longer distance    Baseline  B, L 7 reps  2/5, 10 reps R 3/5  ( 12/17: 15 reps R , 13 reps L, 06/01/19: R 20 reps, L 18 reps  )    Time  10    Period  Weeks    Status  Achieved      PT LONG TERM GOAL  #9   TITLE  Pt will demo shoulder abduction > 120 deg B with improved alignment of thorax over pelvis without manual cues in order to demo proper co-activation of B equal weight bearing in BLE, and decreased thoracic kyhposis/ decreased thoracic rotation 2/2 scoliosis in order to lift plates overhead cabinets/ decreased risk for    Time  10    Period  Weeks    Status  Achieved      PT LONG TERM GOAL  #10   TITLE  Pt will demo increased height from 63" to   64 1/4" and maintain 64 1/4" across 3 weeks ( once a week measurement) and forward head posture improve ( decreased distance from wall to earlobe 17  cm from earlobe to wall to > 12 cm)  in order to be ready to progress to PNF exercise with less shoulder pain and lengthen/ strengthen concave curve on R 2/2 scoliosis to minimize L lateral thigh tightness    Time  8    Period  Weeks    Status  Revised            Plan - 06/22/19 1416    Clinical Impression Statement  Pt no longer had R radiating LBP to knee after she put back her orthotic insoles. Plan to have pt continue wearing her insoles.   Assessed pelvic floor internally  today which showed limited mobility 1-3 layers and slightly lowered bladder position that was still within introitus. Pt showed difficulty with coordinating diaphragm, TrA, and pelvic floor.  Following Tx, pt demo'd improve fascial mobility but will need more internal manual Tx to make further improvements.   Pt continues to report less L lateral thigh tightness upon waking and she is able to sleep for longer hours in the night with less interruption of pain.   Pt continues to benefit from skilled PT.      Personal Factors and Comorbidities  Age;Comorbidity 3+    Comorbidities  Osteoporosis, scoliosis, Arthritis, COPD, recent lumbosacral fusion for spondylolisthesis.    Examination-Activity Limitations  Toileting;Sit;Stand;Bend;Lift;Carry    Examination-Participation Restrictions  Interpersonal Relationship;Yard Work;Cleaning;Laundry    Stability/Clinical Decision Making  Unstable/Unpredictable    Rehab Potential  Good    PT Frequency  2x / week    PT Duration  Other (comment)   20   PT Treatment/Interventions  ADLs/Self Care Home Management;Aquatic Therapy;Moist Heat;Electrical Stimulation;Traction;Therapeutic activities;Functional mobility training;Stair training;Gait training;Therapeutic exercise;Balance training;Neuromuscular re-education;Patient/family education;Manual techniques;Dry needling;Passive range of motion;Scar mobilization;Taping    Consulted and Agree with Plan of Care  Patient       Patient  will benefit from skilled therapeutic intervention in order to improve the following deficits and impairments:  Abnormal gait, Decreased balance, Increased muscle spasms, Decreased range of motion, Decreased scar mobility, Improper body mechanics, Decreased coordination, Decreased strength, Increased fascial restricitons, Impaired flexibility, Postural dysfunction, Pain  Visit Diagnosis: Abnormal posture  Chronic right shoulder pain  Other muscle spasm     Problem List Patient Active Problem List   Diagnosis Date Noted  . Chronic UTI 12/06/2018  . Status post lumbar surgery 12/06/2018  . Spondylolisthesis, lumbar region 09/26/2018  . Aortic atherosclerosis ([place]) 08/31/2018  . CAD (coronary artery disease) 02/23/2018  . Advanced care planning/counseling discussion 03/28/2017  . Varicose veins of both lower extremities with complications 50/27/7412  . Arthritis 12/09/2016  . Vaginal atrophy 09/10/2015  . Menopausal hot flushes 09/10/2015  . Onychomycosis due to dermatophyte 04/07/2015  . Bursitis of right shoulder 02/26/2015  . Environmental and seasonal allergies 02/26/2015  . Other allergic rhinitis 02/26/2015  . Osteoporosis 11/26/2014  . COPD (chronic obstructive pulmonary disease) ([place]) 11/26/2014  . Fuchs' corneal dystrophy 11/26/2014  . Benign hypertensive renal disease 11/26/2014  . Hyperlipidemia 11/26/2014  . Chronic kidney disease, stage III (moderate) ([place]) 11/26/2014  . Hypothyroidism 11/26/2014  . Endothelial corneal dystrophy 11/26/2014    Jerl Mina  ,PT, DPT, E-RYT  06/22/2019, 2:16 PM  [place] MAIN [facility] SERVICES [address] [place], [place], [address] Phone: [phone]   Fax:  [phone]  Name: Kerrilyn C Kinsella MRN: [id number] Date of Birth: 1945-05-31

## 2019-06-26 ENCOUNTER — Encounter: Payer: Medicare Other | Admitting: Physical Therapy

## 2019-06-27 ENCOUNTER — Other Ambulatory Visit: Payer: Self-pay

## 2019-06-27 ENCOUNTER — Ambulatory Visit: Payer: Medicare Other | Admitting: Physical Therapy

## 2019-06-27 DIAGNOSIS — R293 Abnormal posture: Secondary | ICD-10-CM

## 2019-06-27 DIAGNOSIS — M62838 Other muscle spasm: Secondary | ICD-10-CM

## 2019-06-27 DIAGNOSIS — R2689 Other abnormalities of gait and mobility: Secondary | ICD-10-CM | POA: Diagnosis not present

## 2019-06-27 DIAGNOSIS — G8929 Other chronic pain: Secondary | ICD-10-CM

## 2019-06-27 DIAGNOSIS — M25511 Pain in right shoulder: Secondary | ICD-10-CM | POA: Diagnosis not present

## 2019-06-27 NOTE — Therapy (Signed)
[place] MAIN [facility] SERVICES [address] [place], [place], [address] Phone: [phone]   Fax:  [phone]  Physical Therapy Treatment  Patient Details  Name: Julia Deleon MRN: [id number] Date of Birth: [birth year] Referring Provider (PT): Dr. Maryan Puls   Encounter Date: 06/27/2019  PT End of Session - 06/27/19 1221    Visit Number  65    Date for PT Re-Evaluation  08/10/19   next progress note needs to be on Visit #64   Authorization Type  Medicare    PT Start Time  1205    PT Stop Time  1300    PT Time Calculation (min)  55 min    Activity Tolerance  Patient tolerated treatment well;No increased pain    Behavior During Therapy  WFL for tasks assessed/performed       Past Medical History:  Diagnosis Date  . Arthritis   . COPD (chronic obstructive pulmonary disease) ([place])   . Family history of adverse reaction to anesthesia    father was slow to wake up  . Fuch's endothelial dystrophy   . Hyperlipidemia   . Hypothyroidism   . PONV (postoperative nausea and vomiting)    slow to wake up and PONV    Past Surgical History:  Procedure Laterality Date  . APPENDECTOMY    . BACK SURGERY  09/26/2018   L4-5 PLIF by Dr. Arnoldo Morale  . CARDIAC CATHETERIZATION    . CHOLECYSTECTOMY    . EYE SURGERY    . FOOT SURGERY Right    pin removed left  . laser vein surgery    . NASAL SINUS SURGERY    . RIGHT/LEFT HEART CATH AND CORONARY ANGIOGRAPHY N/A 12/06/2017   Procedure: RIGHT/LEFT HEART CATH AND CORONARY ANGIOGRAPHY;  Surgeon: Yolonda Kida, MD;  Location: [place] [facility];  Service: Cardiovascular;  Laterality: N/A;  . TONSILLECTOMY      There were no vitals filed for this visit.  Subjective Assessment - 06/27/19 1208    Limitations  Sitting;Walking;Standing         [facility] PT Assessment - 06/27/19 2247      Squat   Comments  anterior COM over knees. no medial collapse of arches. No genu valgus                     [facility] Adult PT Treatment/Exercise - 06/27/19 2247      Neuro Re-ed    Neuro Re-ed Details   pain science education with anatomy pictures, sensory retraining and mirrir therapy    excessive cues for mini squat               PT Short Term Goals - 06/08/19 1133      PT SHORT TERM GOAL #1   Title  Patient will demonstrate improved pelvic alignment and balance of musculature surrounding the pelvis to facilitate decreased PFM spasms and decrease pelvic pain.    Baseline  Pt. demonstrates severe posterior pelvic tilt, L thoracic/R lumbar scoliosis, and R anterior/L posterior pelvic obliquity. Pt. has demonstrated improved pelvic alignment and posture but has not been able to sustain long-term due to continued lack of postural strength.    Time  5    Period  Weeks    Status  Achieved    Target Date  03/06/19      PT SHORT TERM GOAL #2   Title  Patient will demonstrate a coordinated contraction, relaxation, and bulge of the pelvic floor  muscles to demonstrate functional recruitment and motion and allow for bladder emptying.    Baseline  Pt. is unable to fully empty bladder and describes pain with catheterization, demonstrating high tone of PFM.    Time  5    Period  Weeks    Status  Achieved    Target Date  03/06/19      PT SHORT TERM GOAL #3   Title  Patient will demonstrate improved sitting and standing posture to demonstrate learning and decrease stress on the pelvic floor with functional activity.    Baseline  Pt. demonstrates severe posterior pelvic tilt, L thoracic/R lumbar scoliosis, and R anterior/L posterior pelvic obliquity. 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 Will be able to cath without pain and will be cleared by physiscian to d/c self-cath due to decreased residual volume with bladder emptying.    Baseline  required to cath 3 times per day, painful each time.    Time  10    Period  Weeks    Status  Achieved      PT LONG TERM GOAL #4   Title  Patient will score less than or equal to 40% on the Female NIH-CPSI and 30% on the [facility] to demonstrate a reduction in pain, urinary symptoms, and an improved quality of life.    Baseline  Female NIH-CPSI: 38/43 (88%) , PDI: 45/75 (60%); PDI 44/75 (59%) and Female NIH-CPSI: 27/43 (64%) on 02-08-19 , Female NIH-CPSI: 17% on 03-26-19    Time  10    Period  Weeks    Status  Achieved      PT LONG TERM GOAL #5   Title  Patient will demonstrate increased step length with reciprical arm swing, and demonstrate no scissoring steps.    Baseline  Pt currently demonstrated scissoring steppage on R LE, increased trunk rotation and deep core engagement with reciprical walking poles.    Time  10    Period  Weeks    Status  Achieved      PT LONG TERM GOAL #6   Title  Pt will report no radiating pain down LLE  after walking treadmill for 10 min in order to progress with aerobic exercises    Baseline  after 7 min    Time  8    Period  Weeks    Status  On-going      PT LONG TERM GOAL #7   Title  Pt  will report walking up > 2 mornings in a row with pain level < 5-6/10 in order to demo maintanence of Tx benefits and more balance of mm tensions achieved with HEP, shoe lift to improve QOL    Baseline  7-8/10 level and benefits from Tx lasted only one morning  (03-19-19 and 03-23-19)    Time  10    Period  Weeks    Status  Partially Met      PT LONG TERM GOAL #8   Title  Pt will be able to demo increased PF strength with unilateral UE support on wall from B 3/5 15 reps in order to achieve stronger push off in gait and walk longer distance    Baseline  B, L 7 reps  2/5, 10 reps R 3/5  ( 12/17: 15 reps R , 13 reps L, 06/01/19: R 20 reps, L 18 reps  )    Time  10    Period  Weeks    Status  Achieved      PT LONG TERM GOAL  #9   TITLE  Pt will demo shoulder abduction > 120 deg B with improved alignment of thorax over pelvis without manual cues in order to demo proper co-activation of B equal weight bearing in BLE, and decreased thoracic kyhposis/ decreased thoracic rotation 2/2 scoliosis in order to lift plates overhead cabinets/ decreased risk for    Time  10    Period  Weeks    Status  Achieved      PT LONG TERM GOAL  #10   TITLE  Pt will demo increased height from 63" to   64 1/4" and maintain 64 1/4" across 3 weeks ( once a week measurement) and forward head posture improve ( decreased distance from wall to earlobe 17 cm from earlobe to wall to > 12 cm)  in order to be ready to progress to PNF exercise with less shoulder pain and lengthen/ strengthen concave curve on R 2/2 scoliosis to minimize L lateral thigh tightness    Time  8    Period  Weeks    Status  Revised            Plan - 06/27/19 2246    Clinical Impression Statement  Pt required excessive cues for squat . Pt did not demo medial collapse of arches/ genu valgus  during squat which is a positive indication of stronger plantar arches. Initiated pain science education , sensory retraining, and mirror therapy today and plan to  continue at next session. Pt continues to benefit from skilled PT   Personal Factors and Comorbidities  Age;Comorbidity 3+    Comorbidities  Osteoporosis, scoliosis, Arthritis, COPD, recent lumbosacral fusion for spondylolisthesis.    Examination-Activity Limitations  Toileting;Sit;Stand;Bend;Lift;Carry    Examination-Participation Restrictions  Interpersonal Relationship;Yard Work;Cleaning;Laundry    Stability/Clinical Decision Making  Unstable/Unpredictable    Rehab Potential  Good    PT Frequency  2x / week    PT Duration  Other (comment)   20   PT Treatment/Interventions  ADLs/Self Care Home Management;Aquatic Therapy;Moist Heat;Electrical Stimulation;Traction;Therapeutic activities;Functional mobility training;Stair training;Gait training;Therapeutic exercise;Balance training;Neuromuscular re-education;Patient/family education;Manual techniques;Dry needling;Passive range of motion;Scar mobilization;Taping  Consulted and Agree with Plan of Care  Patient       Patient will benefit from skilled therapeutic intervention in order to improve the following deficits and impairments:  Abnormal gait, Decreased balance, Increased muscle spasms, Decreased range of motion, Decreased scar mobility, Improper body mechanics, Decreased coordination, Decreased strength, Increased fascial restricitons, Impaired flexibility, Postural dysfunction, Pain  Visit Diagnosis: Abnormal posture  Chronic right shoulder pain  Other muscle spasm     Problem List Patient Active Problem List   Diagnosis Date Noted  . Chronic UTI 12/06/2018  . Status post lumbar surgery 12/06/2018  . Spondylolisthesis, lumbar region 09/26/2018  . Aortic atherosclerosis ([place]) 08/31/2018  . CAD (coronary artery disease) 02/23/2018  . Advanced care planning/counseling discussion 03/28/2017  . Varicose veins of both lower extremities with complications 02/28/1172  . Arthritis 12/09/2016  . Vaginal atrophy 09/10/2015  .  Menopausal hot flushes 09/10/2015  . Onychomycosis due to dermatophyte 04/07/2015  . Bursitis of right shoulder 02/26/2015  . Environmental and seasonal allergies 02/26/2015  . Other allergic rhinitis 02/26/2015  . Osteoporosis 11/26/2014  . COPD (chronic obstructive pulmonary disease) ([place]) 11/26/2014  . Fuchs' corneal dystrophy 11/26/2014  . Benign hypertensive renal disease 11/26/2014  . Hyperlipidemia 11/26/2014  . Chronic kidney disease, stage III (moderate) ([place]) 11/26/2014  . Hypothyroidism 11/26/2014  . Endothelial corneal dystrophy 11/26/2014    Jerl Mina ,PT, DPT, E-RYT  06/27/2019, 10:49 PM  [place] MAIN [facility] SERVICES [address] [place], [place], [address] Phone: [phone]   Fax:  [phone]  Name: Lezli C Aslin MRN: [id number] Date of Birth: April 14, 1946

## 2019-06-27 NOTE — Patient Instructions (Signed)
Minisquat: °Scoot buttocks back slight, hinge like you are looking at your reflection on a pond  °Knees behind toes,  °Inhale to "smell flowers" ° °Exhale on the rise "like rocket"  °Do not lock knees, have more weight across ballmounds of feet, toes relaxed  ° °10 reps x 3 x day  ° °-- ° °

## 2019-06-29 ENCOUNTER — Other Ambulatory Visit: Payer: Self-pay

## 2019-06-29 ENCOUNTER — Encounter: Payer: Medicare Other | Admitting: Physical Therapy

## 2019-06-29 ENCOUNTER — Ambulatory Visit: Payer: Medicare Other | Admitting: Physical Therapy

## 2019-06-29 DIAGNOSIS — G8929 Other chronic pain: Secondary | ICD-10-CM | POA: Diagnosis not present

## 2019-06-29 DIAGNOSIS — R293 Abnormal posture: Secondary | ICD-10-CM | POA: Diagnosis not present

## 2019-06-29 DIAGNOSIS — M62838 Other muscle spasm: Secondary | ICD-10-CM

## 2019-06-29 DIAGNOSIS — M25511 Pain in right shoulder: Secondary | ICD-10-CM | POA: Diagnosis not present

## 2019-06-29 DIAGNOSIS — R2689 Other abnormalities of gait and mobility: Secondary | ICD-10-CM | POA: Diagnosis not present

## 2019-06-29 NOTE — Therapy (Addendum)
[place] MAIN [facility] SERVICES [address] [place], [place], [address] Phone: [phone]   Fax:  [phone]  Physical Therapy Treatment  Patient Details  Name: Julia Deleon MRN: [id number] Date of Birth: [birth year] Referring Provider (PT): Dr. Maryan Puls   Encounter Date: 06/29/2019  PT End of Session - 06/29/19 1050    Visit Number  57    Date for PT Re-Evaluation  08/10/19   next progress note needs to be on Visit #64   Authorization Type  Medicare    PT Start Time  1000    PT Stop Time  1100    PT Time Calculation (min)  60 min    Activity Tolerance  Patient tolerated treatment well;No increased pain    Behavior During Therapy  WFL for tasks assessed/performed       Past Medical History:  Diagnosis Date  . Arthritis   . COPD (chronic obstructive pulmonary disease) ([place])   . Family history of adverse reaction to anesthesia    father was slow to wake up  . Fuch's endothelial dystrophy   . Hyperlipidemia   . Hypothyroidism   . PONV (postoperative nausea and vomiting)    slow to wake up and PONV    Past Surgical History:  Procedure Laterality Date  . APPENDECTOMY    . BACK SURGERY  09/26/2018   L4-5 PLIF by Dr. Arnoldo Morale  . CARDIAC CATHETERIZATION    . CHOLECYSTECTOMY    . EYE SURGERY    . FOOT SURGERY Right    pin removed left  . laser vein surgery    . NASAL SINUS SURGERY    . RIGHT/LEFT HEART CATH AND CORONARY ANGIOGRAPHY N/A 12/06/2017   Procedure: RIGHT/LEFT HEART CATH AND CORONARY ANGIOGRAPHY;  Surgeon: Yolonda Kida, MD;  Location: [place] [facility];  Service: Cardiovascular;  Laterality: N/A;  . TONSILLECTOMY      There were no vitals filed for this visit.  Subjective Assessment - 06/29/19 1047    Subjective  Pt reported her back hurt worse. Pt is not sure why, not sure if the weather changing. Pt is sensitive to air pressure changes.    Limitations  Sitting;Walking;Standing         [facility] PT  Assessment - 06/29/19 1052      Other:   Other/ Comments  preTx: bridge w/ B radiating pain, Post Tx: cued for proper knee alignment, no radiating pain, only cramping       Palpation   SI assessment   tightness at glut minimus/ medius on L ( decreased post Tx)                 Pelvic Floor Special Questions - 06/29/19 1049    Pelvic Floor Internal Exam  pt consented without contraindications     Exam Type  Vaginal    Palpation  increased mobility at B 1-3 layers, bladder and urethra were in a more cranial position compared to last week. increased tightenss at coccygeus/ obt int R > L     L radiating pain to ankle shortly after internal Tx   Strength  fair squeeze, definite lift    Strength # of reps  7    Strength # of seconds  1                  PT Short Term Goals - 06/08/19 1133      PT SHORT TERM GOAL #1   Title  Patient will demonstrate improved pelvic alignment and balance of musculature surrounding the pelvis to facilitate decreased PFM spasms and decrease pelvic pain.    Baseline  Pt. demonstrates severe posterior pelvic tilt, L thoracic/R lumbar scoliosis, and R anterior/L posterior pelvic obliquity. Pt. has demonstrated improved pelvic alignment and posture but has not been able to sustain long-term due to continued lack of postural strength.    Time  5    Period  Weeks    Status  Achieved    Target Date  03/06/19      PT SHORT TERM GOAL #2   Title  Patient will demonstrate a coordinated contraction, relaxation, and bulge of the pelvic floor muscles to demonstrate functional recruitment and motion and allow for bladder emptying.    Baseline  Pt. is unable to fully empty bladder and describes pain with catheterization, demonstrating high tone of PFM.    Time  5    Period  Weeks    Status  Achieved    Target Date  03/06/19      PT SHORT TERM GOAL #3   Title  Patient will demonstrate improved sitting and standing posture to demonstrate learning and  decrease stress on the pelvic floor with functional activity.    Baseline  Pt. demonstrates severe posterior pelvic tilt, L thoracic/R lumbar scoliosis, and R anterior/L posterior pelvic obliquity. 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 Will be able to cath without pain and will be cleared by physiscian to d/c self-cath due to decreased residual volume with bladder emptying.    Baseline  required to cath 3 times per day, painful each time.    Time  10    Period  Weeks    Status  Achieved      PT LONG TERM GOAL #4   Title  Patient will score less than or equal to 40% on the Female NIH-CPSI and 30% on the [facility] to demonstrate a reduction in pain, urinary symptoms, and an improved quality of life.    Baseline  Female NIH-CPSI: 38/43 (88%) , PDI: 45/75 (60%); PDI 44/75 (59%) and Female NIH-CPSI: 27/43 (64%) on  02-08-19 , Female NIH-CPSI: 17% on 03-26-19    Time  10    Period  Weeks    Status  Achieved      PT LONG TERM GOAL #5   Title  Patient will demonstrate increased step length with  reciprical arm swing, and demonstrate no scissoring steps.    Baseline  Pt currently demonstrated scissoring steppage on R LE, increased trunk rotation and deep core engagement with reciprical walking poles.    Time  10    Period  Weeks    Status  Achieved      PT LONG TERM GOAL #6   Title  Pt will report no radiating pain down LLE after walking treadmill for 10 min in order to progress with aerobic exercises    Baseline  after 7 min    Time  8    Period  Weeks    Status  On-going      PT LONG TERM GOAL #7   Title  Pt will report walking up > 2 mornings in a row with pain level < 5-6/10 in order to demo maintanence of Tx benefits and more balance of mm tensions achieved with HEP, shoe lift to improve QOL    Baseline  7-8/10 level and benefits from Tx lasted only one morning  (03-19-19 and 03-23-19)    Time  10    Period  Weeks    Status  Partially Met      PT LONG TERM GOAL #8   Title  Pt will be able to demo increased PF strength with unilateral UE support on wall from B 3/5 15 reps in order to achieve stronger push off in gait and walk longer distance    Baseline  B, L 7 reps  2/5, 10 reps R 3/5  ( 12/17: 15 reps R , 13 reps L, 06/01/19: R 20 reps, L 18 reps  )    Time  10    Period  Weeks    Status  Achieved      PT LONG TERM GOAL  #9   TITLE  Pt will demo shoulder abduction > 120 deg B with improved alignment of thorax over pelvis without manual cues in order to demo proper co-activation of B equal weight bearing in BLE, and decreased thoracic kyhposis/ decreased thoracic rotation 2/2 scoliosis in order to lift plates overhead cabinets/ decreased risk for    Time  10    Period  Weeks    Status  Achieved      PT LONG TERM GOAL  #10   TITLE  Pt will demo increased height from 63" to   64 1/4" and maintain 64 1/4" across 3 weeks ( once a week measurement) and forward head posture improve ( decreased distance from wall to earlobe 17 cm from earlobe to wall to > 12 cm)  in order to be  ready to progress to PNF exercise with less shoulder pain and lengthen/ strengthen concave curve on R 2/2 scoliosis to minimize L lateral thigh tightness    Time  8    Period  Weeks    Status  Revised            Plan - 06/29/19 1052    Clinical Impression Statement  Pt demo'd improved pelvic floor mobility today compared to last session and increased pelvic floor strength from 2/5 to 3/5 with proper sequential contraction all all 3 layers. Pt also demo'd a more cranial position of urethra  and bladder today. Pt required excessive cues for minisquat technique. Pt continues to benefit from skilled PT   Personal Factors and Comorbidities  Age;Comorbidity 3+    Comorbidities  Osteoporosis, scoliosis, Arthritis, COPD, recent lumbosacral fusion for spondylolisthesis.    Examination-Activity Limitations  Toileting;Sit;Stand;Bend;Lift;Carry    Examination-Participation Restrictions  Interpersonal Relationship;Yard Work;Cleaning;Laundry    Stability/Clinical Decision Making  Unstable/Unpredictable    Rehab Potential  Good    PT Frequency  2x / week    PT Duration  Other (comment)   20   PT Treatment/Interventions  ADLs/Self Care Home Management;Aquatic Therapy;Moist Heat;Electrical Stimulation;Traction;Therapeutic activities;Functional mobility training;Stair training;Gait training;Therapeutic exercise;Balance training;Neuromuscular re-education;Patient/family education;Manual techniques;Dry needling;Passive range of motion;Scar mobilization;Taping    Consulted and Agree with Plan of Care  Patient       Patient will benefit from skilled therapeutic intervention in order to improve the following deficits and impairments:  Abnormal gait, Decreased balance, Increased muscle spasms, Decreased range of motion, Decreased scar mobility, Improper body mechanics, Decreased coordination, Decreased strength, Increased fascial restricitons, Impaired flexibility, Postural dysfunction, Pain  Visit  Diagnosis: Abnormal posture  Chronic right shoulder pain  Other muscle spasm     Problem List Patient Active Problem List   Diagnosis Date Noted  . Chronic UTI 12/06/2018  . Status post lumbar surgery 12/06/2018  . Spondylolisthesis, lumbar region 09/26/2018  . Aortic atherosclerosis ([place]) 08/31/2018  . CAD (coronary artery disease) 02/23/2018  . Advanced care planning/counseling discussion 03/28/2017  . Varicose veins of both lower extremities with complications 03/26/3566  . Arthritis 12/09/2016  . Vaginal atrophy 09/10/2015  . Menopausal hot flushes 09/10/2015  . Onychomycosis due to dermatophyte 04/07/2015  . Bursitis of right shoulder 02/26/2015  . Environmental and seasonal allergies 02/26/2015  . Other allergic rhinitis 02/26/2015  . Osteoporosis 11/26/2014  . COPD (chronic obstructive pulmonary disease) ([place]) 11/26/2014  . Fuchs' corneal dystrophy 11/26/2014  . Benign hypertensive renal disease 11/26/2014  . Hyperlipidemia 11/26/2014  . Chronic kidney disease, stage III (moderate) ([place]) 11/26/2014  . Hypothyroidism 11/26/2014  . Endothelial corneal dystrophy 11/26/2014    Jerl Mina ,PT, DPT, E-RYT  06/29/2019, 11:03 AM  [place] MAIN [facility] SERVICES [address] [place], [place], [address] Phone: [phone]   Fax:  [phone]  Name: Julia Deleon MRN: [id number] Date of Birth: 1945-09-17

## 2019-06-29 NOTE — Patient Instructions (Addendum)
Minisquat: Scoot buttocks back slight, hinge like you are looking at your reflection on a pond  Knees behind toes,  Inhale to "smell flowers" or "picking up on a box"   Exhale on the rise "like rocket"  Do not lock knees, have more weight across ballmounds of feet, toes relaxed   10 reps x 3 x day   ____  Notice natural pelvic tilts with breathing    ____   Practice proper pelvic floor coordination  Inhale: expand pelvic floor muscles Exhale" "j" scoop, allow pelvic floor to close, lift first before belly sinks   ( not "draw abdominal muscle to spine" or strain with abdominal muscles")

## 2019-06-30 DIAGNOSIS — Z23 Encounter for immunization: Secondary | ICD-10-CM | POA: Diagnosis not present

## 2019-07-03 ENCOUNTER — Ambulatory Visit: Payer: Medicare Other | Admitting: Physical Therapy

## 2019-07-04 ENCOUNTER — Ambulatory Visit: Payer: Medicare Other | Admitting: Physical Therapy

## 2019-07-04 ENCOUNTER — Encounter: Payer: Medicare Other | Admitting: Physical Therapy

## 2019-07-04 ENCOUNTER — Other Ambulatory Visit: Payer: Self-pay

## 2019-07-04 DIAGNOSIS — G8929 Other chronic pain: Secondary | ICD-10-CM | POA: Diagnosis not present

## 2019-07-04 DIAGNOSIS — M25511 Pain in right shoulder: Secondary | ICD-10-CM | POA: Diagnosis not present

## 2019-07-04 DIAGNOSIS — R2689 Other abnormalities of gait and mobility: Secondary | ICD-10-CM | POA: Diagnosis not present

## 2019-07-04 DIAGNOSIS — R293 Abnormal posture: Secondary | ICD-10-CM

## 2019-07-04 DIAGNOSIS — M62838 Other muscle spasm: Secondary | ICD-10-CM | POA: Diagnosis not present

## 2019-07-04 NOTE — Patient Instructions (Addendum)
Lengthen Back rib by _ shoulder    Lie on   side , pillow between knees  Pull  arm overhead over mattress, grab the edge of mattress,pull it upward, drawing elbow away from ears  Breathing   Open book (handout)  Lying on  _ side , rotating  __ only this week    Open book ( chin tuck) on both sides     ____  Shoulder blade squeezes

## 2019-07-04 NOTE — Therapy (Signed)
[place] MAIN [facility] SERVICES [address] [place], [place], [address] Phone: [phone]   Fax:  [phone]  Physical Therapy Treatment  Patient Details  Name: Julia Deleon MRN: [id number] Date of Birth: [birth year] Referring Provider (PT): Dr. Maryan Puls   Encounter Date: 07/04/2019  PT End of Session - 07/04/19 1249    Visit Number  59    Date for PT Re-Evaluation  08/10/19   next progress note needs to be on Visit #64   Authorization Type  Medicare    PT Start Time  1100    PT Stop Time  1158    PT Time Calculation (min)  58 min    Activity Tolerance  Patient tolerated treatment well;No increased pain    Behavior During Therapy  WFL for tasks assessed/performed       Past Medical History:  Diagnosis Date  . Arthritis   . COPD (chronic obstructive pulmonary disease) ([place])   . Family history of adverse reaction to anesthesia    father was slow to wake up  . Fuch's endothelial dystrophy   . Hyperlipidemia   . Hypothyroidism   . PONV (postoperative nausea and vomiting)    slow to wake up and PONV    Past Surgical History:  Procedure Laterality Date  . APPENDECTOMY    . BACK SURGERY  09/26/2018   L4-5 PLIF by Dr. Arnoldo Morale  . CARDIAC CATHETERIZATION    . CHOLECYSTECTOMY    . EYE SURGERY    . FOOT SURGERY Right    pin removed left  . laser vein surgery    . NASAL SINUS SURGERY    . RIGHT/LEFT HEART CATH AND CORONARY ANGIOGRAPHY N/A 12/06/2017   Procedure: RIGHT/LEFT HEART CATH AND CORONARY ANGIOGRAPHY;  Surgeon: Yolonda Kida, MD;  Location: [place] [facility];  Service: Cardiovascular;  Laterality: N/A;  . TONSILLECTOMY      There were no vitals filed for this visit.  Subjective Assessment - 07/04/19 1213    Subjective  After the last session, pt reported more pain in her neck and then got a terrible headache for 4 days . Pt went to the chiropractor and it got better.  Pt got the vaccine two days ago and she felt  sick. Pt is feeling better today.   Overall, the L thigh tightness is less and not as painful with activities. Sitting for long periods, the tightness comes back again.    Limitations  Sitting;Walking;Standing         [facility] PT Assessment - 07/04/19 1246      Observation/Other Assessments   Observations  open book exercise: performed with cervical ext/ and reported neck tightness. Stopped exercise, assessed upper neck and provided Tx with occiptal release       Palpation   Spinal mobility  T3-5 intercostals L, paraspinal L, upper neck tensions. upper trap/ occiptal mm                    [facility] Adult PT Treatment/Exercise - 07/04/19 1245      Neuro Re-ed    Neuro Re-ed Details   cervical retraction in open book exercise       Moist Heat Therapy   Number Minutes Moist Heat  5 Minutes    Moist Heat Location  --   neck and shoulders     Manual Therapy   Manual therapy comments  STM/MWM at T3-5 intercostals L, paraspinal L, occipital release  PT Short Term Goals - 06/08/19 1133      PT SHORT TERM GOAL #1   Title  Patient will demonstrate improved pelvic alignment and balance of musculature surrounding the pelvis to facilitate decreased PFM spasms and decrease pelvic pain.    Baseline  Pt. demonstrates severe posterior pelvic tilt, L thoracic/R lumbar scoliosis, and R anterior/L posterior pelvic obliquity. Pt. has demonstrated improved pelvic alignment and posture but has not been able to sustain long-term due to continued lack of postural strength.    Time  5    Period  Weeks    Status  Achieved    Target Date  03/06/19      PT SHORT TERM GOAL #2   Title  Patient will demonstrate a coordinated contraction, relaxation, and bulge of the pelvic floor muscles to demonstrate functional recruitment and motion and allow for bladder emptying.    Baseline  Pt. is unable to fully empty bladder and describes pain with catheterization, demonstrating high  tone of PFM.    Time  5    Period  Weeks    Status  Achieved    Target Date  03/06/19      PT SHORT TERM GOAL #3   Title  Patient will demonstrate improved sitting and standing posture to demonstrate learning and decrease stress on the pelvic floor with functional activity.    Baseline  Pt. demonstrates severe posterior pelvic tilt, L thoracic/R lumbar scoliosis, and R anterior/L posterior pelvic obliquity. 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 Will be able to cath without pain and will be cleared by physiscian to d/c self-cath due to decreased residual volume with bladder emptying.    Baseline  required to cath 3 times per day, painful each time.    Time  10    Period  Weeks    Status  Achieved      PT LONG TERM GOAL #4   Title  Patient will score less than or equal to 40% on the Female NIH-CPSI and 30% on the [facility] to demonstrate a reduction in pain, urinary symptoms, and an improved quality of life.  Baseline  Female NIH-CPSI: 38/43 (88%) , PDI: 45/75 (60%); PDI 44/75 (59%) and Female  NIH-CPSI: 27/43 (64%) on 02-08-19 , Female NIH-CPSI: 17% on 03-26-19    Time  10    Period  Weeks    Status  Achieved      PT LONG TERM GOAL #5   Title  Patient will demonstrate increased step length with reciprical arm swing, and demonstrate no scissoring steps.    Baseline  Pt currently demonstrated scissoring steppage on R LE, increased trunk rotation and deep core engagement with reciprical walking poles.    Time  10    Period  Weeks    Status  Achieved      PT LONG TERM GOAL #6   Title  Pt will report no radiating pain down LLE after walking treadmill for 10 min in order to progress with aerobic exercises    Baseline  after 7 min    Time  8    Period  Weeks    Status  On-going      PT LONG TERM GOAL #7   Title  Pt will report walking up > 2 mornings in a row with pain level < 5-6/10 in order to demo maintanence of Tx benefits and more balance of mm tensions achieved with HEP, shoe lift to improve QOL    Baseline  7-8/10 level and benefits from Tx lasted only one morning  (03-19-19 and 03-23-19)    Time  10    Period  Weeks    Status  Partially Met      PT LONG TERM GOAL #8   Title  Pt will be able to demo increased PF strength with unilateral UE support on wall from B 3/5 15 reps in order to achieve stronger push off in gait and walk longer distance    Baseline  B, L 7 reps  2/5, 10 reps R 3/5  ( 12/17: 15 reps R , 13 reps L, 06/01/19: R 20 reps, L 18 reps  )    Time  10    Period  Weeks    Status  Achieved      PT LONG TERM GOAL  #9   TITLE  Pt will demo shoulder abduction > 120 deg B with improved alignment of thorax over pelvis without manual cues in order to demo proper co-activation of B equal weight bearing in BLE, and decreased thoracic kyhposis/ decreased thoracic rotation 2/2 scoliosis in order to lift plates overhead cabinets/ decreased risk for    Time  10    Period  Weeks    Status  Achieved      PT LONG TERM GOAL  #10   TITLE  Pt will demo increased height from  63" to   64 1/4" and maintain 64 1/4" across 3 weeks ( once a week measurement) and forward head posture improve ( decreased distance from wall to earlobe 17 cm from earlobe to wall to > 12 cm)  in order to be ready to progress to PNF exercise with less shoulder pain and lengthen/ strengthen concave curve on R 2/2 scoliosis to minimize L lateral thigh tightness    Time  8    Period  Weeks    Status  Revised            Plan - 07/04/19 1249    Clinical Impression Statement    Today, focused on L medial scapular area/ upper neck tensions with manual Tx which pt tolerated  without increased pain. Pt was able to perform open book exercise rotating L without c/o neck tightness post Tx.    Pt demo'd more reciporcal motion in thorax, arm swing, and pelvis in gait along with more scapular depression./ retraction which is an improvement and indicates scoliosis related deviations are corrected. Pt's shoulder AROM has increased for overhead reaching because thoracic kyphosis has significantly decreased. These improvements , pt will be ready for bilateral BUE exercises soon.   Pt also reports she continues to sleep deeper most nights without being bothered by her L thigh tightness and also the tightness is less with overall activites.   Pt continues to benefit from skilled PT.    Personal Factors and Comorbidities  Age;Comorbidity 3+    Comorbidities  Osteoporosis, scoliosis, Arthritis, COPD, recent lumbosacral fusion for spondylolisthesis.    Examination-Activity Limitations  Toileting;Sit;Stand;Bend;Lift;Carry    Examination-Participation Restrictions  Interpersonal Relationship;Yard Work;Cleaning;Laundry    Stability/Clinical Decision Making  Unstable/Unpredictable    Rehab Potential  Good    PT Frequency  2x / week    PT Duration  Other (comment)   20   PT Treatment/Interventions  ADLs/Self Care Home Management;Aquatic Therapy;Moist Heat;Electrical Stimulation;Traction;Therapeutic  activities;Functional mobility training;Stair training;Gait training;Therapeutic exercise;Balance training;Neuromuscular re-education;Patient/family education;Manual techniques;Dry needling;Passive range of motion;Scar mobilization;Taping    Consulted and Agree with Plan of Care  Patient       Patient will benefit from skilled therapeutic intervention in order to improve the following deficits and impairments:  Abnormal gait, Decreased balance, Increased muscle spasms, Decreased range of motion, Decreased scar mobility, Improper body mechanics, Decreased coordination, Decreased strength, Increased fascial restricitons, Impaired flexibility, Postural dysfunction, Pain  Visit Diagnosis: Abnormal posture  Chronic right shoulder pain  Other muscle spasm     Problem List Patient Active Problem List   Diagnosis Date Noted  . Chronic UTI 12/06/2018  . Status post lumbar surgery 12/06/2018  . Spondylolisthesis, lumbar region 09/26/2018  . Aortic atherosclerosis ([place]) 08/31/2018  . CAD (coronary artery disease) 02/23/2018  . Advanced care planning/counseling discussion 03/28/2017  . Varicose veins of both lower extremities with complications 56/15/3794  . Arthritis 12/09/2016  . Vaginal atrophy 09/10/2015  . Menopausal hot flushes 09/10/2015  . Onychomycosis due to dermatophyte 04/07/2015  . Bursitis of right shoulder 02/26/2015  . Environmental and seasonal allergies 02/26/2015  . Other allergic rhinitis 02/26/2015  . Osteoporosis 11/26/2014  . COPD (chronic obstructive pulmonary disease) ([place]) 11/26/2014  . Fuchs' corneal dystrophy 11/26/2014  . Benign hypertensive renal disease 11/26/2014  . Hyperlipidemia 11/26/2014  . Chronic kidney disease, stage III (moderate) ([place]) 11/26/2014  . Hypothyroidism 11/26/2014  . Endothelial corneal dystrophy 11/26/2014    Jerl Mina ,PT, DPT, E-RYT  07/04/2019, 12:50 PM  [place] MAIN [facility]  SERVICES [address] [place], [place], [address] Phone: [phone]   Fax:  [phone]  Name: Ettie C Poitra MRN: [id number] Date of Birth: [birth year]

## 2019-07-06 ENCOUNTER — Encounter: Payer: Medicare Other | Admitting: Physical Therapy

## 2019-07-06 ENCOUNTER — Other Ambulatory Visit: Payer: Self-pay

## 2019-07-06 ENCOUNTER — Ambulatory Visit: Payer: Medicare Other | Admitting: Physical Therapy

## 2019-07-06 DIAGNOSIS — R293 Abnormal posture: Secondary | ICD-10-CM | POA: Diagnosis not present

## 2019-07-06 DIAGNOSIS — M62838 Other muscle spasm: Secondary | ICD-10-CM | POA: Diagnosis not present

## 2019-07-06 DIAGNOSIS — M25511 Pain in right shoulder: Secondary | ICD-10-CM | POA: Diagnosis not present

## 2019-07-06 DIAGNOSIS — G8929 Other chronic pain: Secondary | ICD-10-CM

## 2019-07-06 DIAGNOSIS — R2689 Other abnormalities of gait and mobility: Secondary | ICD-10-CM | POA: Diagnosis not present

## 2019-07-06 NOTE — Therapy (Addendum)
[place] MAIN [facility] SERVICES [address] [place], [place], [address] Phone: [phone]   Fax:  [phone]  Physical Therapy Treatment / Progress Note reporting from 06/05/19 to 07/06/19   Patient Details  Name: Julia Deleon MRN: [id number] Date of Birth: 02-Jul-1945 Referring Provider (PT): Dr. Maryan Puls   Encounter Date: 07/06/2019  PT End of Session - 07/06/19 1011    Visit Number  [id number]    Date for PT Re-Evaluation  08/10/19   next progress note needs to be on Visit #64   Authorization Type  Medicare    PT Start Time  1011    PT Stop Time  1100    PT Time Calculation (min)  49 min    Activity Tolerance  Patient tolerated treatment well;No increased pain    Behavior During Therapy  WFL for tasks assessed/performed       Past Medical History:  Diagnosis Date  . Arthritis   . COPD (chronic obstructive pulmonary disease) ([place])   . Family history of adverse reaction to anesthesia    father was slow to wake up  . Fuch's endothelial dystrophy   . Hyperlipidemia   . Hypothyroidism   . PONV (postoperative nausea and vomiting)    slow to wake up and PONV    Past Surgical History:  Procedure Laterality Date  . APPENDECTOMY    . BACK SURGERY  09/26/2018   L4-5 PLIF by Dr. Arnoldo Morale  . CARDIAC CATHETERIZATION    . CHOLECYSTECTOMY    . EYE SURGERY    . FOOT SURGERY Right    pin removed left  . laser vein surgery    . NASAL SINUS SURGERY    . RIGHT/LEFT HEART CATH AND CORONARY ANGIOGRAPHY N/A 12/06/2017   Procedure: RIGHT/LEFT HEART CATH AND CORONARY ANGIOGRAPHY;  Surgeon: Yolonda Kida, MD;  Location: [facility];  Service: Cardiovascular;  Laterality: N/A;  . TONSILLECTOMY      There were no vitals filed for this visit.  Subjective Assessment - 07/06/19 1016    Subjective  Pt reports her arm is still sore from her vaccine. Pt rested from the side effects of there vaccine     Limitations  Sitting;Walking;Standing          [facility] PT Assessment - 07/06/19 1020      AROM   Overall AROM Comments   standing shoulder abduction:  120 deg  ( 05/29/19: R 80 deg)      PROM   Overall PROM Comments  supine -15 deg B knee ext   ( 12/ 7/20 : -30 deg R )        Walking endurance:   6 MWT : 747' without rest break. tightness reported. car <> clinic : 730' causes radiating pa in    Standing Height without shoes:  63 3/4 inches    Dynamic balance: lunge position for stretches post walking: required BUE support         PT Short Term Goals - 06/08/19 1133      PT SHORT TERM GOAL #1   Title  Patient will demonstrate improved pelvic alignment and balance of musculature surrounding the pelvis to facilitate decreased PFM spasms and decrease pelvic pain.    Baseline  Pt. demonstrates severe posterior pelvic tilt, L thoracic/R lumbar scoliosis, and R anterior/L posterior pelvic obliquity. Pt. has demonstrated improved pelvic alignment and posture but has not been able to sustain long-term due to continued lack of postural  strength.    Time  5    Period  Weeks    Status  Achieved    Target Date  03/06/19      PT SHORT TERM GOAL #2   Title  Patient will demonstrate a coordinated contraction, relaxation, and bulge of the pelvic floor muscles to demonstrate functional recruitment and motion and allow for bladder emptying.    Baseline  Pt. is unable to fully empty bladder and describes pain with catheterization, demonstrating high tone of PFM.    Time  5    Period  Weeks    Status  Achieved    Target Date  03/06/19      PT SHORT TERM GOAL #3   Title  Patient will demonstrate improved sitting and standing posture to demonstrate learning and decrease stress on the pelvic floor with functional activity.    Baseline  Pt. demonstrates severe posterior pelvic tilt, L thoracic/R lumbar scoliosis, and R anterior/L posterior pelvic obliquity. 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 Will be able to cath without pain and will be cleared by physiscian to d/c self-cath due to decreased residual volume with bladder emptying.    Baseline  required to cath 3 times per day, painful each time.    Time  10    Period  Weeks    Status  Achieved      PT LONG TERM GOAL #4   Title  Patient will score less than or equal to 40% on the Female NIH-CPSI and 30% on the [facility] to demonstrate a reduction in pain, urinary symptoms, and an improved quality of life.    Baseline  Female NIH-CPSI: 38/43 (88%) , PDI: 45/75 (60%); PDI 44/75 (59%) and Female NIH-CPSI: 27/43 (64%) on 02-08-19 , Female NIH-CPSI: 17% on 03-26-19    Time  10    Period  Weeks    Status  Achieved      PT LONG TERM GOAL #5   Title  Patient will demonstrate increased step length with reciprical arm swing, and demonstrate no scissoring steps.    Baseline  Pt currently demonstrated scissoring steppage on  R LE, increased trunk rotation and deep core engagement with reciprical walking poles.    Time  10    Period  Weeks    Status  Achieved       Additional Long Term Goals   Additional Long Term Goals  Yes      PT LONG TERM GOAL #6   Title  Pt will increase her distance on the 6MWT from 747 feet  to  > 900 feet  order to progress with aerobic exercises and walking longer distances in the community    Baseline  --    Time  8    Period  Weeks    Status  Revised    Target Date  09/14/19      PT LONG TERM GOAL #7   Title  Pt will report waking up > 2 mornings in a row with pain level < 5-6/10 in order to demo maintanence of Tx benefits and more balance of mm tensions achieved with HEP, shoe lift to improve QOL    Baseline  7-8/10 level and benefits from Tx lasted only one morning  (03-19-19 and 03-23-19)    Time  10    Period  Weeks    Status  Partially Met      PT LONG TERM GOAL #8   Title  Pt will be able to demo increased PF strength with unilateral UE support on wall from B 3/5 15 reps in order to achieve stronger push off in gait and walk longer distance    Baseline  B, L 7 reps  2/5, 10 reps R 3/5  ( 12/17: 15 reps R , 13 reps L, 06/01/19: R 20 reps, L 18 reps  )    Time  10    Period  Weeks    Status  Achieved      PT LONG TERM GOAL  #9   TITLE  Pt will demo shoulder abduction > 120 deg B with improved alignment of thorax over pelvis without manual cues in order to demo proper co-activation of B equal weight bearing in BLE, and decreased thoracic kyhposis/ decreased thoracic rotation 2/2 scoliosis in order to lift plates overhead cabinets/ decreased risk for    Time  10    Period  Weeks    Status  Achieved      PT LONG TERM GOAL  #10   TITLE  Pt will demo increased height from 63" to > 63 1/2 " and maintain 63 1/2" across 3 weeks ( once a week measurement) and forward head posture improve ( decreased distance from wall to earlobe 17 cm from earlobe to wall to > 12 cm)  in order to be ready to progress to PNF exercise with less shoulder pain and lengthen/ strengthen concave curve on R 2/2 scoliosis to minimize L lateral thigh  tightness  (07/06/19 : 63 3/4" )    Time  8    Period  Weeks    Status  Partially Met      PT LONG TERM GOAL  #11   TITLE  Pt will demo single UE support with lunge position exercises/ yoga poses  in order to demo improved balance and IND with flexibility routine    Baseline  BUE support    Time  10    Period  Weeks    Status  New    Target Date  09/14/19            Plan - 07/06/19  1011    Clinical Impression Statement  Pt has achieved 6/11 goals and is progressing well with remaining goals. Pt's scoliosis continues to be addressed. Milestone achievements across the past 10 visits include:  _ increased L knee ext from -30 deg to -15 deg in supine which has helped with increased hip ext/push off, and more reciprocal rotation of pelvis in gait  _increased upright posture, less thoracic kyphosis/ thoracolumbar scoliotic curve which has helped with reciprocal thorax/ pelvic rotation in gait  _increased R shoulder abduction from 80 deg to 120 deg which will enable pt to perform new HEP with UE with use of therabands   _intrinsic feet mm and toe abduction have improved. Overlapping digit II-III has been corrected and pt has learned to put more weight on L foot which has in turn helped with increasing knee ext and hip/ SIJ mobility.    Today, pt was assessed for walking endurance in 6MWT and distance from car<> clinic. Pt walked 747' which is close to the distance she walks from car to clinic. This distance is associated with tightness occuring at L thigh to knee. Plan to continue increasing walking endurance with assessment of orthopedic issues.   Areas of focus: increasing hip abduction strength, lengthening R lumbar area 2/2 concave curve and assess pelvic floor / progress with pelvic floor strengthening for postural stability. Plan to also add pain science education and implement consistent flexibility/ stretching routine, and also consider pt's Hx of venous surgery with more  consistent practice of leg elevation at home and the end of Tx sessions.   Pt benefits from skilled PT.        Personal Factors and Comorbidities  Age;Comorbidity 3+    Comorbidities  Osteoporosis, scoliosis, Arthritis, COPD, recent lumbosacral fusion for spondylolisthesis.    Examination-Activity Limitations  Toileting;Sit;Stand;Bend;Lift;Carry    Examination-Participation Restrictions  Interpersonal Relationship;Yard Work;Cleaning;Laundry    Stability/Clinical Decision Making  Unstable/Unpredictable    Rehab Potential  Good    PT Frequency  2x / week    PT Duration  Other (comment)   20   PT Treatment/Interventions  ADLs/Self Care Home Management;Aquatic Therapy;Moist Heat;Electrical Stimulation;Traction;Therapeutic activities;Functional mobility training;Stair training;Gait training;Therapeutic exercise;Balance training;Neuromuscular re-education;Patient/family education;Manual techniques;Dry needling;Passive range of motion;Scar mobilization;Taping    Consulted and Agree with Plan of Care  Patient       Patient will benefit from skilled therapeutic intervention in order to improve the following deficits and impairments:  Abnormal gait, Decreased balance, Increased muscle spasms, Decreased range of motion, Decreased scar mobility, Improper body mechanics, Decreased coordination, Decreased strength, Increased fascial restricitons, Impaired flexibility, Postural dysfunction, Pain  Visit Diagnosis: Abnormal posture  Chronic right shoulder pain  Other muscle spasm     Problem List Patient Active Problem List   Diagnosis Date Noted  . Chronic UTI 12/06/2018  . Status post lumbar surgery 12/06/2018  . Spondylolisthesis, lumbar region 09/26/2018  . Aortic atherosclerosis ([place]) 08/31/2018  . CAD (coronary artery disease) 02/23/2018  . Advanced care planning/counseling discussion 03/28/2017  . Varicose veins of both lower extremities with complications 28/31/5176  . Arthritis  12/09/2016  . Vaginal atrophy 09/10/2015  . Menopausal hot flushes 09/10/2015  . Onychomycosis due to dermatophyte 04/07/2015  . Bursitis of right shoulder 02/26/2015  . Environmental and seasonal allergies 02/26/2015  . Other allergic rhinitis 02/26/2015  . Osteoporosis 11/26/2014  . COPD (chronic obstructive pulmonary disease) ([place]) 11/26/2014  . Fuchs' corneal dystrophy 11/26/2014  . Benign hypertensive renal disease 11/26/2014  . Hyperlipidemia 11/26/2014  .  Chronic kidney disease, stage III (moderate) ([place]) 11/26/2014  . Hypothyroidism 11/26/2014  . Endothelial corneal dystrophy 11/26/2014    Jerl Mina ,PT, DPT, E-RYT  07/06/2019, 11:26 AM  [place] MAIN [facility] SERVICES [address] [place], [place], [address] Phone: [phone]   Fax:  [phone]  Name: Julia Deleon MRN: [id number] Date of Birth: 1946/04/13

## 2019-07-09 DIAGNOSIS — N1831 Chronic kidney disease, stage 3a: Secondary | ICD-10-CM | POA: Diagnosis not present

## 2019-07-09 DIAGNOSIS — I10 Essential (primary) hypertension: Secondary | ICD-10-CM | POA: Diagnosis not present

## 2019-07-09 DIAGNOSIS — E785 Hyperlipidemia, unspecified: Secondary | ICD-10-CM | POA: Diagnosis not present

## 2019-07-09 DIAGNOSIS — R600 Localized edema: Secondary | ICD-10-CM | POA: Diagnosis not present

## 2019-07-10 ENCOUNTER — Other Ambulatory Visit: Payer: Self-pay

## 2019-07-10 ENCOUNTER — Ambulatory Visit: Payer: Medicare Other | Attending: Urology | Admitting: Physical Therapy

## 2019-07-10 DIAGNOSIS — G8929 Other chronic pain: Secondary | ICD-10-CM | POA: Diagnosis not present

## 2019-07-10 DIAGNOSIS — M62838 Other muscle spasm: Secondary | ICD-10-CM

## 2019-07-10 DIAGNOSIS — M25511 Pain in right shoulder: Secondary | ICD-10-CM | POA: Diagnosis not present

## 2019-07-10 DIAGNOSIS — R293 Abnormal posture: Secondary | ICD-10-CM | POA: Diagnosis not present

## 2019-07-10 DIAGNOSIS — R2689 Other abnormalities of gait and mobility: Secondary | ICD-10-CM | POA: Insufficient documentation

## 2019-07-10 NOTE — Patient Instructions (Addendum)
Seated feet slides with toes lifted, ballmounds down  Heel back  Knee aligned with 3rd toes   ___ Elevate legs after being on your feet ( after errand)   Propping legs over pillows behidn knee, ankles support 5-10 min   body scan practice

## 2019-07-11 NOTE — Therapy (Signed)
[place] MAIN [facility] SERVICES [address] [place], [place], [address] Phone: [phone]   Fax:  [phone]  Physical Therapy Treatment  Patient Details  Name: Julia Deleon MRN: [id number] Date of Birth: 01/03/1946 Referring Provider (PT): Dr. Maryan Puls   Encounter Date: 07/10/2019  PT End of Session - 07/10/19 1057    Visit Number  14    Date for PT Re-Evaluation  09/11/19   Progress note to report #74 (last on #64 07/06/19)   Authorization Type  Medicare    PT Start Time  1003    PT Stop Time  1058    PT Time Calculation (min)  55 min    Activity Tolerance  Patient tolerated treatment well;No increased pain    Behavior During Therapy  WFL for tasks assessed/performed       Past Medical History:  Diagnosis Date  . Arthritis   . COPD (chronic obstructive pulmonary disease) ([place])   . Family history of adverse reaction to anesthesia    father was slow to wake up  . Fuch's endothelial dystrophy   . Hyperlipidemia   . Hypothyroidism   . PONV (postoperative nausea and vomiting)    slow to wake up and PONV    Past Surgical History:  Procedure Laterality Date  . APPENDECTOMY    . BACK SURGERY  09/26/2018   L4-5 PLIF by Dr. Arnoldo Morale  . CARDIAC CATHETERIZATION    . CHOLECYSTECTOMY    . EYE SURGERY    . FOOT SURGERY Right    pin removed left  . laser vein surgery    . NASAL SINUS SURGERY    . RIGHT/LEFT HEART CATH AND CORONARY ANGIOGRAPHY N/A 12/06/2017   Procedure: RIGHT/LEFT HEART CATH AND CORONARY ANGIOGRAPHY;  Surgeon: Yolonda Kida, MD;  Location: [place] CV [facility];  Service: Cardiovascular;  Laterality: N/A;  . TONSILLECTOMY      There were no vitals filed for this visit.  Subjective Assessment - 07/10/19 1012    Subjective  Pt reported the tightness along the L leg got better in the afternoon after her visit. "Overall, it is getting better but it is getting better."  Pt is interested in dry needling   Limitations   Sitting;Walking;Standing         [facility] PT Assessment - 07/10/19 1054      Step Up   Comments  BUE support , 5 reps before report of L thigh tightness       Step Down   Comments  BUE support poor eccentric control, propioception of feet, 5 reps before report of L thigh tightness         Palpation   Palpation comment  tightness at ant tib/ gastroc  ( decreased post Tx)                    [facility] Adult PT Treatment/Exercise - 07/10/19 1052      Therapeutic Activites    Other Therapeutic Activities  prop legs up , explained the circulation role for legs given Hx of venous issues      Neuro Re-ed    Neuro Re-ed Details   cued for propioception with step up and down CGA and BUE support on wall , and knee alignment in feet slides in seated position       Manual Therapy   Manual therapy comments  STM/MWM at ant tib/ gastroc, distraction at femorotibial joint, medial glide to promote hip ER.  PT Short Term Goals - 06/08/19 1133      PT SHORT TERM GOAL #1   Title  Patient will demonstrate improved pelvic alignment and balance of musculature surrounding the pelvis to facilitate decreased PFM spasms and decrease pelvic pain.    Baseline  Pt. demonstrates severe posterior pelvic tilt, L thoracic/R lumbar scoliosis, and R anterior/L posterior pelvic obliquity. Pt. has demonstrated improved pelvic alignment and posture but has not been able to sustain long-term due to continued lack of postural strength.    Time  5    Period  Weeks    Status  Achieved    Target Date  03/06/19      PT SHORT TERM GOAL #2   Title  Patient will demonstrate a coordinated contraction, relaxation, and bulge of the pelvic floor muscles to demonstrate functional recruitment and motion and allow for bladder emptying.    Baseline  Pt. is unable to fully empty bladder and describes pain with catheterization, demonstrating high tone of PFM.    Time  5    Period  Weeks    Status   Achieved    Target Date  03/06/19      PT SHORT TERM GOAL #3   Title  Patient will demonstrate improved sitting and standing posture to demonstrate learning and decrease stress on the pelvic floor with functional activity.    Baseline  Pt. demonstrates severe posterior pelvic tilt, L thoracic/R lumbar scoliosis, and R anterior/L posterior pelvic obliquity. 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 Will be able to cath without pain and will be cleared by physiscian to d/c self-cath due to decreased residual volume with bladder emptying.    Baseline  required to cath 3 times per day, painful each time.    Time  10    Period  Weeks    Status  Achieved      PT LONG TERM GOAL #4   Title  Patient will score less than or equal to 40% on the Female NIH-CPSI and 30% on the [facility] to demonstrate a reduction in pain, urinary symptoms, and an improved quality of life.  Baseline  Female NIH-CPSI: 38/43 (88%) , PDI: 45/75 (60%); PDI 44/75 (59%) and Female NIH-CPSI: 27/43 (64%) on 02-08-19 , Female NIH-CPSI:  17% on 03-26-19    Time  10    Period  Weeks    Status  Achieved      PT LONG TERM GOAL #5   Title  Patient will demonstrate increased step length with reciprical arm swing, and demonstrate no scissoring steps.    Baseline  Pt currently demonstrated scissoring steppage on R LE, increased trunk rotation and deep core engagement with reciprical walking poles.    Time  10    Period  Weeks    Status  Achieved      Additional Long Term Goals   Additional Long Term Goals  Yes      PT LONG TERM GOAL #6   Title  Pt will increase her distance on the 6MWT from 747 feet  to  > 900 feet  order to progress with aerobic exercises and walking longer distances in the community    Baseline  --    Time  8    Period  Weeks    Status  Revised    Target Date  09/14/19      PT LONG TERM GOAL #7   Title  Pt will report waking up > 2 mornings in a row with pain level < 5-6/10 in order to demo maintanence of Tx benefits and more balance of mm tensions achieved with HEP, shoe lift to improve QOL    Baseline  7-8/10 level and benefits from Tx lasted only one morning  (03-19-19 and 03-23-19)    Time  10    Period  Weeks    Status  Partially Met      PT LONG TERM GOAL #8   Title  Pt will be able to demo increased PF strength with unilateral UE support on wall from B 3/5 15 reps in order to achieve stronger push off in gait and walk longer distance    Baseline  B, L 7 reps  2/5, 10 reps R 3/5  ( 12/17: 15 reps R , 13 reps L, 06/01/19: R 20 reps, L 18 reps  )    Time  10    Period  Weeks    Status  Achieved      PT LONG TERM GOAL  #9   TITLE  Pt will demo shoulder abduction > 120 deg B with improved alignment of thorax over pelvis without manual cues in order to demo proper co-activation of B equal weight bearing in BLE, and decreased thoracic kyhposis/ decreased thoracic rotation 2/2 scoliosis in order to lift plates overhead cabinets/ decreased risk for    Time  10    Period  Weeks    Status  Achieved       PT LONG TERM GOAL  #10   TITLE  Pt will demo increased height from 63" to > 63 1/2 " and maintain 63 1/2" across 3 weeks ( once a week measurement) and forward head posture improve ( decreased distance from wall to earlobe 17 cm from earlobe to wall to > 12 cm)  in order to be ready to progress to PNF exercise with less shoulder pain and lengthen/ strengthen concave curve on R 2/2 scoliosis to minimize L lateral thigh tightness  (07/06/19 : 63 3/4" )    Time  8    Period  Weeks    Status  Partially Met  PT LONG TERM GOAL  #11   TITLE  Pt will demo single UE support with lunge position exercises/ yoga poses  in order to demo improved balance and IND with flexibility routine    Baseline  BUE support    Time  10    Period  Weeks    Status  New    Target Date  09/14/19            Plan - 07/10/19 1059    Clinical Impression Statement  Pt's L thigh tightness is improving from a MSK perspective and also from subjective report "Overall, it is getting better but it is getting better."    Addressed leg / knee L tightness today. Discussed adding dry needling to POC per pt's interest.  Discussed elevating legs after being on her feet 2/2 pt's previous venous issues. Plan to continue incorporating elevated leg position at the end of future session to promote circulate.   Pt continues to benefit from skilled PT.   Personal Factors and Comorbidities  Age;Comorbidity 3+    Comorbidities  Osteoporosis, scoliosis, Arthritis, COPD, recent lumbosacral fusion for spondylolisthesis.    Examination-Activity Limitations  Toileting;Sit;Stand;Bend;Lift;Carry    Examination-Participation Restrictions  Interpersonal Relationship;Yard Work;Cleaning;Laundry    Stability/Clinical Decision Making  Unstable/Unpredictable    Rehab Potential  Good    PT Frequency  2x / week    PT Duration  Other (comment)   20   PT Treatment/Interventions  ADLs/Self Care Home Management;Aquatic Therapy;Moist Heat;Electrical  Stimulation;Traction;Therapeutic activities;Functional mobility training;Stair training;Gait training;Therapeutic exercise;Balance training;Neuromuscular re-education;Patient/family education;Manual techniques;Dry needling;Passive range of motion;Scar mobilization;Taping    Consulted and Agree with Plan of Care  Patient       Patient will benefit from skilled therapeutic intervention in order to improve the following deficits and impairments:  Abnormal gait, Decreased balance, Increased muscle spasms, Decreased range of motion, Decreased scar mobility, Improper body mechanics, Decreased coordination, Decreased strength, Increased fascial restricitons, Impaired flexibility, Postural dysfunction, Pain  Visit Diagnosis: Abnormal posture  Chronic right shoulder pain  Other muscle spasm     Problem List Patient Active Problem List   Diagnosis Date Noted  . Chronic UTI 12/06/2018  . Status post lumbar surgery 12/06/2018  . Spondylolisthesis, lumbar region 09/26/2018  . Aortic atherosclerosis ([place]) 08/31/2018  . CAD (coronary artery disease) 02/23/2018  . Advanced care planning/counseling discussion 03/28/2017  . Varicose veins of both lower extremities with complications 05/03/8345  . Arthritis 12/09/2016  . Vaginal atrophy 09/10/2015  . Menopausal hot flushes 09/10/2015  . Onychomycosis due to dermatophyte 04/07/2015  . Bursitis of right shoulder 02/26/2015  . Environmental and seasonal allergies 02/26/2015  . Other allergic rhinitis 02/26/2015  . Osteoporosis 11/26/2014  . COPD (chronic obstructive pulmonary disease) ([place]) 11/26/2014  . Fuchs' corneal dystrophy 11/26/2014  . Benign hypertensive renal disease 11/26/2014  . Hyperlipidemia 11/26/2014  . Chronic kidney disease, stage III (moderate) ([place]) 11/26/2014  . Hypothyroidism 11/26/2014  . Endothelial corneal dystrophy 11/26/2014    Jerl Mina ,PT, DPT, E-RYT  07/11/2019, 2:30 PM  [place] MAIN [facility] SERVICES [address] [place], [place], [address] Phone: [phone]   Fax:  [phone]  Name: Julia Deleon MRN: [id number] Date of Birth: 12/22/1945

## 2019-07-13 ENCOUNTER — Ambulatory Visit: Payer: Medicare Other | Admitting: Physical Therapy

## 2019-07-13 ENCOUNTER — Other Ambulatory Visit: Payer: Self-pay

## 2019-07-13 DIAGNOSIS — M25511 Pain in right shoulder: Secondary | ICD-10-CM

## 2019-07-13 DIAGNOSIS — R2689 Other abnormalities of gait and mobility: Secondary | ICD-10-CM | POA: Diagnosis not present

## 2019-07-13 DIAGNOSIS — M62838 Other muscle spasm: Secondary | ICD-10-CM

## 2019-07-13 DIAGNOSIS — R293 Abnormal posture: Secondary | ICD-10-CM

## 2019-07-13 DIAGNOSIS — G8929 Other chronic pain: Secondary | ICD-10-CM | POA: Diagnosis not present

## 2019-07-13 NOTE — Therapy (Addendum)
[place] MAIN [facility] SERVICES [address] [place], [place], [address] Phone: [phone]   Fax:  [phone]  Physical Therapy Treatment  Patient Details  Name: Julia Deleon MRN: [id number] Date of Birth: 1945/05/12 Referring Provider (PT): Dr. Maryan Puls   Encounter Date: 07/13/2019  PT End of Session - 07/13/19 1212    Visit Number  61    Date for PT Re-Evaluation  09/11/19   Progress note to report #74 (last on #64 07/06/19)   Authorization Type  Medicare    PT Start Time  1010    PT Stop Time  1100    PT Time Calculation (min)  50 min    Activity Tolerance  Patient tolerated treatment well;No increased pain    Behavior During Therapy  WFL for tasks assessed/performed       Past Medical History:  Diagnosis Date  . Arthritis   . COPD (chronic obstructive pulmonary disease) ([place])   . Family history of adverse reaction to anesthesia    father was slow to wake up  . Fuch's endothelial dystrophy   . Hyperlipidemia   . Hypothyroidism   . PONV (postoperative nausea and vomiting)    slow to wake up and PONV    Past Surgical History:  Procedure Laterality Date  . APPENDECTOMY    . BACK SURGERY  09/26/2018   L4-5 PLIF by Dr. Arnoldo Morale  . CARDIAC CATHETERIZATION    . CHOLECYSTECTOMY    . EYE SURGERY    . FOOT SURGERY Right    pin removed left  . laser vein surgery    . NASAL SINUS SURGERY    . RIGHT/LEFT HEART CATH AND CORONARY ANGIOGRAPHY N/A 12/06/2017   Procedure: RIGHT/LEFT HEART CATH AND CORONARY ANGIOGRAPHY;  Surgeon: Yolonda Kida, MD;  Location: [place] [facility];  Service: Cardiovascular;  Laterality: N/A;  . TONSILLECTOMY      There were no vitals filed for this visit.  Subjective Assessment - 07/13/19 0913    Subjective  Pt reported the morning after last session, pt woke up in the middle of the night with tightness and pain down the thigh . Pt does not remember if it went down the thigh.    Limitations   Sitting;Walking;Standing         [facility] PT Assessment - 07/13/19 1000      Palpation   Spinal mobility  intercostals T3-7 R tight lacking lateral expansion in L sideflexion,. tightness           Tx:   Manual Tx STM/MWM to promote  intercostals T3-7 R mobility and lateral expansion   Neuro -Redu: cued for HEP              PT Short Term Goals - 06/08/19 1133      PT SHORT TERM GOAL #1   Title  Patient will demonstrate improved pelvic alignment and balance of musculature surrounding the pelvis to facilitate decreased PFM spasms and decrease pelvic pain.    Baseline  Pt. demonstrates severe posterior pelvic tilt, L thoracic/R lumbar scoliosis, and R anterior/L posterior pelvic obliquity. Pt. has demonstrated improved pelvic alignment and posture but has not been able to sustain long-term due to continued lack of postural strength.    Time  5    Period  Weeks    Status  Achieved    Target Date  03/06/19      PT SHORT TERM GOAL #2   Title  Patient will  demonstrate a coordinated contraction, relaxation, and bulge of the pelvic floor muscles to demonstrate functional recruitment and motion and allow for bladder emptying.    Baseline  Pt. is unable to fully empty bladder and describes pain with catheterization, demonstrating high tone of PFM.    Time  5    Period  Weeks    Status  Achieved    Target Date  03/06/19      PT SHORT TERM GOAL #3   Title  Patient will demonstrate improved sitting and standing posture to demonstrate learning and decrease stress on the pelvic floor with functional activity.    Baseline  Pt. demonstrates severe posterior pelvic tilt, L thoracic/R lumbar scoliosis, and R anterior/L posterior pelvic obliquity. 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 Will be able to cath without pain and will be cleared by physiscian to d/c self-cath due to decreased residual volume with bladder emptying.    Baseline  required to cath 3 times per day, painful each time.    Time  10    Period  Weeks    Status  Achieved      PT LONG TERM GOAL #4   Title  Patient will score less than or equal to 40% on the Female NIH-CPSI and 30% on the [facility] to demonstrate a reduction in pain, urinary symptoms, and an improved quality of life.    Baseline  Female NIH-CPSI: 38/43 (88%) , PDI: 45/75 (60%); PDI 44/75 (59%) and Female NIH-CPSI: 27/43 (64%) on 02-08-19 , Female NIH-CPSI: 17% on 03-26-19    Time  10    Period  Weeks    Status  Achieved      PT LONG TERM GOAL #5   Title  Patient will demonstrate increased step length with reciprical arm swing, and demonstrate no scissoring steps.    Baseline  Pt currently demonstrated scissoring steppage on R LE, increased trunk rotation and deep core engagement with reciprical walking poles.    Time  10    Period  Weeks    Status  Achieved      Additional Long Term Goals  Additional Long Term Goals  Yes      PT LONG TERM GOAL #6   Title  Pt will increase her distance on the 6MWT from 747 feet  to  > 900 feet  order to  progress with aerobic exercises and walking longer distances in the community    Baseline  --    Time  8    Period  Weeks    Status  Revised    Target Date  09/14/19      PT LONG TERM GOAL #7   Title  Pt will report waking up > 2 mornings in a row with pain level < 5-6/10 in order to demo maintanence of Tx benefits and more balance of mm tensions achieved with HEP, shoe lift to improve QOL    Baseline  7-8/10 level and benefits from Tx lasted only one morning  (03-19-19 and 03-23-19)    Time  10    Period  Weeks    Status  Partially Met      PT LONG TERM GOAL #8   Title  Pt will be able to demo increased PF strength with unilateral UE support on wall from B 3/5 15 reps in order to achieve stronger push off in gait and walk longer distance    Baseline  B, L 7 reps  2/5, 10 reps R 3/5  ( 12/17: 15 reps R , 13 reps L, 06/01/19: R 20 reps, L 18 reps  )    Time  10    Period  Weeks    Status  Achieved      PT LONG TERM GOAL  #9   TITLE  Pt will demo shoulder abduction > 120 deg B with improved alignment of thorax over pelvis without manual cues in order to demo proper co-activation of B equal weight bearing in BLE, and decreased thoracic kyhposis/ decreased thoracic rotation 2/2 scoliosis in order to lift plates overhead cabinets/ decreased risk for    Time  10    Period  Weeks    Status  Achieved      PT LONG TERM GOAL  #10   TITLE  Pt will demo increased height from 63" to > 63 1/2 " and maintain 63 1/2" across 3 weeks ( once a week measurement) and forward head posture improve ( decreased distance from wall to earlobe 17 cm from earlobe to wall to > 12 cm)  in order to be ready to progress to PNF exercise with less shoulder pain and lengthen/ strengthen concave curve on R 2/2 scoliosis to minimize L lateral thigh tightness  (07/06/19 : 63 3/4" )    Time  8    Period  Weeks    Status  Partially Met      PT LONG TERM GOAL  #11   TITLE  Pt will demo single UE support with lunge position  exercises/ yoga poses  in order to demo improved balance and IND with flexibility routine    Baseline  BUE support    Time  10    Period  Weeks    Status  New    Target Date  09/14/19            Plan - 07/13/19 1213    Clinical Impression Statement  Pt tolerated manual Tx without complaints and achieved increased R intercostal expansion laterally and thoracic extension. This improvement will have with less R trunk lean with gait 2/2 concavity of scoliotic curve at T-spine.  Pt required cues for proper technique for HEP. Pt continues to benefit from skilled PT.   Personal Factors and Comorbidities  Age;Comorbidity 3+    Comorbidities  Osteoporosis, scoliosis, Arthritis, COPD, recent lumbosacral fusion for spondylolisthesis.    Examination-Activity Limitations  Toileting;Sit;Stand;Bend;Lift;Carry    Examination-Participation Restrictions  Interpersonal Relationship;Yard Work;Cleaning;Laundry    Stability/Clinical Decision Making  Unstable/Unpredictable    Rehab Potential  Good    PT Frequency  2x / week    PT Duration  Other (comment)   20   PT Treatment/Interventions  ADLs/Self Care Home Management;Aquatic Therapy;Moist Heat;Electrical Stimulation;Traction;Therapeutic activities;Functional mobility training;Stair training;Gait training;Therapeutic exercise;Balance training;Neuromuscular re-education;Patient/family education;Manual techniques;Dry needling;Passive range of motion;Scar mobilization;Taping    Consulted and Agree with Plan of Care  Patient       Patient will benefit from skilled therapeutic intervention in order to improve the following deficits and impairments:  Abnormal gait, Decreased balance, Increased muscle spasms, Decreased range of motion, Decreased scar mobility, Improper body mechanics, Decreased coordination, Decreased strength, Increased fascial restricitons, Impaired flexibility, Postural dysfunction, Pain  Visit Diagnosis: Abnormal posture  Chronic right  shoulder pain  Other muscle spasm     Problem List Patient Active Problem List   Diagnosis Date Noted  . Chronic UTI 12/06/2018  . Status post lumbar surgery 12/06/2018  . Spondylolisthesis, lumbar region 09/26/2018  . Aortic atherosclerosis ([place]) 08/31/2018  . CAD (coronary artery disease) 02/23/2018  . Advanced care planning/counseling discussion 03/28/2017  . Varicose veins of both lower extremities with complications 31/51/7616  . Arthritis 12/09/2016  . Vaginal atrophy 09/10/2015  . Menopausal hot flushes 09/10/2015  . Onychomycosis due to dermatophyte 04/07/2015  . Bursitis of right shoulder 02/26/2015  . Environmental and seasonal allergies 02/26/2015  . Other allergic rhinitis 02/26/2015  . Osteoporosis 11/26/2014  . COPD (chronic obstructive pulmonary disease) ([place]) 11/26/2014  . Fuchs' corneal dystrophy 11/26/2014  . Benign hypertensive renal disease 11/26/2014  . Hyperlipidemia 11/26/2014  . Chronic kidney disease, stage III (moderate) ([place]) 11/26/2014  . Hypothyroidism 11/26/2014  . Endothelial corneal dystrophy 11/26/2014    Jerl Mina ,PT, DPT, E-RYT  07/13/2019, 12:14 PM  [place] MAIN [facility] SERVICES [address] [place], [place], [address] Phone: [phone]   Fax:  [phone]  Name: Julia Deleon MRN: [id number] Date of Birth: July 04, 1945

## 2019-07-13 NOTE — Patient Instructions (Addendum)
   kitchen counter stretches  Hands on kitchen counter,   Palms shoulder width apart  Minisquat postion Trunk is parallel to floor  A) Pull buttocks back to lengthen spine, knees bent  3 breaths   B) Bring R hand to the L, and stretch the R side trunk  3 breaths   Brings hands to center again      5 x day  ___   Stand perpendicular to the Door,  R side body to door side Hand at elbow level  Elbow by side, lean shoulders to L  20 reps   2 x day   ___   Hold off on the step ups

## 2019-07-16 ENCOUNTER — Encounter: Payer: Medicare Other | Admitting: Physical Therapy

## 2019-07-17 ENCOUNTER — Ambulatory Visit: Payer: Medicare Other | Admitting: Physical Therapy

## 2019-07-17 ENCOUNTER — Other Ambulatory Visit: Payer: Self-pay

## 2019-07-17 DIAGNOSIS — G8929 Other chronic pain: Secondary | ICD-10-CM | POA: Diagnosis not present

## 2019-07-17 DIAGNOSIS — R293 Abnormal posture: Secondary | ICD-10-CM

## 2019-07-17 DIAGNOSIS — M62838 Other muscle spasm: Secondary | ICD-10-CM | POA: Diagnosis not present

## 2019-07-17 DIAGNOSIS — R2689 Other abnormalities of gait and mobility: Secondary | ICD-10-CM | POA: Diagnosis not present

## 2019-07-17 DIAGNOSIS — M25511 Pain in right shoulder: Secondary | ICD-10-CM | POA: Diagnosis not present

## 2019-07-18 NOTE — Patient Instructions (Signed)
Isometric press with shoulder abduction at ~60 deg in mini squat position against wall  5 sec, 10 reps

## 2019-07-18 NOTE — Therapy (Addendum)
[place] MAIN [facility] SERVICES [address] [place], [place], [address] Phone: [phone]   Fax:  [phone]  Physical Therapy Treatment  Patient Details  Name: Julia Deleon MRN: [id number] Date of Birth: 1945/12/26 Referring Provider (PT): Dr. Maryan Puls   Encounter Date: 07/17/2019  PT End of Session - 07/18/19 1141    Visit Number  [id number]    Date for PT Re-Evaluation  09/11/19   Progress note to report #74 (last on #64 07/06/19)   Authorization Type  Medicare    PT Start Time  1110    PT Stop Time  1215    PT Time Calculation (min)  65 min    Activity Tolerance  Patient tolerated treatment well;No increased pain    Behavior During Therapy  WFL for tasks assessed/performed       Past Medical History:  Diagnosis Date  . Arthritis   . COPD (chronic obstructive pulmonary disease) ([place])   . Family history of adverse reaction to anesthesia    father was slow to wake up  . Fuch's endothelial dystrophy   . Hyperlipidemia   . Hypothyroidism   . PONV (postoperative nausea and vomiting)    slow to wake up and PONV    Past Surgical History:  Procedure Laterality Date  . APPENDECTOMY    . BACK SURGERY  09/26/2018   L4-5 PLIF by Dr. Arnoldo Morale  . CARDIAC CATHETERIZATION    . CHOLECYSTECTOMY    . EYE SURGERY    . FOOT SURGERY Right    pin removed left  . laser vein surgery    . NASAL SINUS SURGERY    . RIGHT/LEFT HEART CATH AND CORONARY ANGIOGRAPHY N/A 12/06/2017   Procedure: RIGHT/LEFT HEART CATH AND CORONARY ANGIOGRAPHY;  Surgeon: Yolonda Kida, MD;  Location: [facility];  Service: Cardiovascular;  Laterality: N/A;  . TONSILLECTOMY      There were no vitals filed for this visit.  Subjective Assessment - 07/18/19 1141    Subjective  Pt reported she noticed cramping in her legs yesterday and stated she doverdid it with activities yesterday. Pt did not stretch after the gym. Pt was cleaning up clutter with repeated bending  and then she went to a funeral home viewing    Limitations  Sitting;Walking;Standing         [facility] PT Assessment - 07/18/19 1146      AROM   Overall AROM Comments  posteriot tilt of pelvis to achieve 180 deg of shoulder abduction. wall squat position with shoulder abduction limited 90 deg to prevent compensation       Palpation   Palpation comment  tightness along long interspinals,  along R medial scap     2/2 scoliosis                  [facility] Adult PT Treatment/Exercise - 07/18/19 1143      Therapeutic Activites    Other Therapeutic Activities  discussed importance of stretching after gym activities, rest break to minimize tightness in body , discussed  elevating legs to improve venous circulation       Neuro Re-ed    Neuro Re-ed Details   cued for wall siometric press w/ cervical retraction, shoulder ext, elb ext, and scap retraction       Moist Heat Therapy   Number Minutes Moist Heat  10 Minutes    Moist Heat Location  Other (comment)   legs propped on stool, heat under  back, not billed     Manual Therapy   Manual therapy comments  STM/ MWM along interspinals along R medial scap                PT Short Term Goals - 06/08/19 1133      PT SHORT TERM GOAL #1   Title  Patient will demonstrate improved pelvic alignment and balance of musculature surrounding the pelvis to facilitate decreased PFM spasms and decrease pelvic pain.    Baseline  Pt. demonstrates severe posterior pelvic tilt, L thoracic/R lumbar scoliosis, and R anterior/L posterior pelvic obliquity. Pt. has demonstrated improved pelvic alignment and posture but has not been able to sustain long-term due to continued lack of postural strength.    Time  5    Period  Weeks    Status  Achieved    Target Date  03/06/19      PT SHORT TERM GOAL #2   Title  Patient will demonstrate a coordinated contraction, relaxation, and bulge of the pelvic floor muscles to demonstrate functional recruitment and  motion and allow for bladder emptying.    Baseline  Pt. is unable to fully empty bladder and describes pain with catheterization, demonstrating high tone of PFM.    Time  5    Period  Weeks    Status  Achieved    Target Date  03/06/19      PT SHORT TERM GOAL #3   Title  Patient will demonstrate improved sitting and standing posture to demonstrate learning and decrease stress on the pelvic floor with functional activity.    Baseline  Pt. demonstrates severe posterior pelvic tilt, L thoracic/R lumbar scoliosis, and R anterior/L posterior pelvic obliquity. 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 Will be able to cath without pain and will be cleared by physiscian to d/c self-cath due to decreased residual volume with bladder emptying.    Baseline  required to cath 3 times per day, painful each time.    Time  10    Period  Weeks    Status  Achieved      PT LONG TERM GOAL #  4   Title  Patient will score less than or equal to 40% on the Female NIH-CPSI and 30% on the [facility] to demonstrate a reduction  in pain, urinary symptoms, and an improved quality of life.    Baseline  Female NIH-CPSI: 38/43 (88%) , PDI: 45/75 (60%); PDI 44/75 (59%) and Female NIH-CPSI: 27/43 (64%) on 02-08-19 , Female NIH-CPSI: 17% on 03-26-19    Time  10    Period  Weeks    Status  Achieved      PT LONG TERM GOAL #5   Title  Patient will demonstrate increased step length with reciprical arm swing, and demonstrate no scissoring steps.    Baseline  Pt currently demonstrated scissoring steppage on R LE, increased trunk rotation and deep core engagement with reciprical walking poles.    Time  10    Period  Weeks    Status  Achieved      Additional Long Term Goals   Additional Long Term Goals  Yes      PT LONG TERM GOAL #6   Title  Pt will increase her distance on the 6MWT from 747 feet  to  > 900 feet  order to progress with aerobic exercises and walking longer distances in the community    Baseline  --    Time  8    Period  Weeks    Status  Revised    Target Date  09/14/19      PT LONG TERM GOAL #7   Title  Pt will report waking up > 2 mornings in a row with pain level < 5-6/10 in order to demo maintanence of Tx benefits and more balance of mm tensions achieved with HEP, shoe lift to improve QOL    Baseline  7-8/10 level and benefits from Tx lasted only one morning  (03-19-19 and 03-23-19)    Time  10    Period  Weeks    Status  Partially Met      PT LONG TERM GOAL #8   Title  Pt will be able to demo increased PF strength with unilateral UE support on wall from B 3/5 15 reps in order to achieve stronger push off in gait and walk longer distance    Baseline  B, L 7 reps  2/5, 10 reps R 3/5  ( 12/17: 15 reps R , 13 reps L, 06/01/19: R 20 reps, L 18 reps  )    Time  10    Period  Weeks    Status  Achieved      PT LONG TERM GOAL  #9   TITLE  Pt will demo shoulder abduction > 120 deg B with improved alignment of thorax over pelvis without manual cues in order to demo proper co-activation of B equal weight bearing  in BLE, and decreased thoracic kyhposis/ decreased thoracic rotation 2/2 scoliosis in order to lift plates overhead cabinets/ decreased risk for    Time  10    Period  Weeks    Status  Achieved      PT LONG TERM GOAL  #10   TITLE  Pt will demo increased height from 63" to > 63 1/2 " and maintain 63 1/2" across 3 weeks ( once a week measurement) and forward head posture improve ( decreased distance from wall to earlobe 17 cm from earlobe to wall to > 12 cm)  in order to be ready to progress to PNF exercise with less shoulder pain and  lengthen/ strengthen concave curve on R 2/2 scoliosis to minimize L lateral thigh tightness  (07/06/19 : 63 3/4" )    Time  8    Period  Weeks    Status  Partially Met      PT LONG TERM GOAL  #11   TITLE  Pt will demo single UE support with lunge position exercises/ yoga poses  in order to demo improved balance and IND with flexibility routine    Baseline  BUE support    Time  10    Period  Weeks    Status  New    Target Date  09/14/19            Plan - 07/18/19 1142    Clinical Impression Statement  Pt required more manual Tx along the R spine where concave curve is located. Post Tx, pt's interspinal mm tightness decreased. Anticipate this will help pt achieve shoulder abduction > 120 deg without compensation with posterior tilt of pelvis. Isometric strengthening by wall was added to promote more extension of spine. Added position with legs elevated at end of session. Educated pt the importance of stretching after working out at [employer] and ensuring flexibility with relaxation practices to not overdo her activities. Pt voiced understanding and compliance.   Pt continues to benefit from skilled PT.     Personal Factors and Comorbidities  Age;Comorbidity 3+    Comorbidities  Osteoporosis, scoliosis, Arthritis, COPD, recent lumbosacral fusion for spondylolisthesis.    Examination-Activity Limitations  Toileting;Sit;Stand;Bend;Lift;Carry     Examination-Participation Restrictions  Interpersonal Relationship;Yard Work;Cleaning;Laundry    Stability/Clinical Decision Making  Unstable/Unpredictable    Rehab Potential  Good    PT Frequency  2x / week    PT Duration  Other (comment)   20   PT Treatment/Interventions  ADLs/Self Care Home Management;Aquatic Therapy;Moist Heat;Electrical Stimulation;Traction;Therapeutic activities;Functional mobility training;Stair training;Gait training;Therapeutic exercise;Balance training;Neuromuscular re-education;Patient/family education;Manual techniques;Dry needling;Passive range of motion;Scar mobilization;Taping    Consulted and Agree with Plan of Care  Patient       Patient will benefit from skilled therapeutic intervention in order to improve the following deficits and impairments:  Abnormal gait, Decreased balance, Increased muscle spasms, Decreased range of motion, Decreased scar mobility, Improper body mechanics, Decreased coordination, Decreased strength, Increased fascial restricitons, Impaired flexibility, Postural dysfunction, Pain  Visit Diagnosis: No diagnosis found.     Problem List Patient Active Problem List   Diagnosis Date Noted  . Chronic UTI 12/06/2018  . Status post lumbar surgery 12/06/2018  . Spondylolisthesis, lumbar region 09/26/2018  . Aortic atherosclerosis ([place]) 08/31/2018  . CAD (coronary artery disease) 02/23/2018  . Advanced care planning/counseling discussion 03/28/2017  . Varicose veins of both lower extremities with complications 27/11/8673  . Arthritis 12/09/2016  . Vaginal atrophy 09/10/2015  . Menopausal hot flushes 09/10/2015  . Onychomycosis due to dermatophyte 04/07/2015  . Bursitis of right shoulder 02/26/2015  . Environmental and seasonal allergies 02/26/2015  . Other allergic rhinitis 02/26/2015  . Osteoporosis 11/26/2014  . COPD (chronic obstructive pulmonary disease) ([place]) 11/26/2014  . Fuchs' corneal dystrophy 11/26/2014  . Benign  hypertensive renal disease 11/26/2014  . Hyperlipidemia 11/26/2014  . Chronic kidney disease, stage III (moderate) ([place]) 11/26/2014  . Hypothyroidism 11/26/2014  . Endothelial corneal dystrophy 11/26/2014    Jerl Mina ,PT, DPT, E-RYT  07/18/2019, 9:13 PM  [place] MAIN [facility] SERVICES [address] [place], [place], [address] Phone: [phone]   Fax:  [phone]  Name: Julia Deleon MRN: [id number] Date of Birth: [birth year]

## 2019-07-19 ENCOUNTER — Ambulatory Visit: Payer: Medicare Other | Admitting: Physical Therapy

## 2019-07-19 ENCOUNTER — Other Ambulatory Visit: Payer: Self-pay

## 2019-07-19 DIAGNOSIS — M25511 Pain in right shoulder: Secondary | ICD-10-CM | POA: Diagnosis not present

## 2019-07-19 DIAGNOSIS — R293 Abnormal posture: Secondary | ICD-10-CM

## 2019-07-19 DIAGNOSIS — G8929 Other chronic pain: Secondary | ICD-10-CM

## 2019-07-19 DIAGNOSIS — M62838 Other muscle spasm: Secondary | ICD-10-CM | POA: Diagnosis not present

## 2019-07-19 DIAGNOSIS — R2689 Other abnormalities of gait and mobility: Secondary | ICD-10-CM | POA: Diagnosis not present

## 2019-07-19 NOTE — Patient Instructions (Signed)
Pat yourself on back/ reaching behind back with hand towel Lengthening around shoulder blade /ribs, stretching opposite pect /front shoulder muscles:   R hand pats back of your band, elbow pointed up to ceiling/ holding hand towel  Roll L shoulder back and down, grab bottom of hand towel , pull gently down  *Press back of L hand against back , inhale expand back, make sure to not over arch the low back,  Keep chin tucked, press head back as if wall is behind you   __ 

## 2019-07-20 ENCOUNTER — Encounter: Payer: Medicare Other | Admitting: Physical Therapy

## 2019-07-20 NOTE — Therapy (Signed)
[place] MAIN [facility] SERVICES [address] [place], [place], [address] Phone: [phone]   Fax:  [phone]  Physical Therapy Treatment  Patient Details  Name: Julia Deleon MRN: [id number] Date of Birth: [birth year] Referring Provider (PT): Dr. Maryan Puls   Encounter Date: 07/19/2019  PT End of Session - 07/20/19 1136    Visit Number  [id number]    Date for PT Re-Evaluation  09/11/19   Progress note to report #74 (last on #64 07/06/19)   Authorization Type  Medicare    PT Start Time  1110    PT Stop Time  1205    PT Time Calculation (min)  55 min    Activity Tolerance  Patient tolerated treatment well;No increased pain    Behavior During Therapy  WFL for tasks assessed/performed       Past Medical History:  Diagnosis Date  . Arthritis   . COPD (chronic obstructive pulmonary disease) ([place])   . Family history of adverse reaction to anesthesia    father was slow to wake up  . Fuch's endothelial dystrophy   . Hyperlipidemia   . Hypothyroidism   . PONV (postoperative nausea and vomiting)    slow to wake up and PONV    Past Surgical History:  Procedure Laterality Date  . APPENDECTOMY    . BACK SURGERY  09/26/2018   L4-5 PLIF by Dr. Arnoldo Morale  . CARDIAC CATHETERIZATION    . CHOLECYSTECTOMY    . EYE SURGERY    . FOOT SURGERY Right    pin removed left  . laser vein surgery    . NASAL SINUS SURGERY    . RIGHT/LEFT HEART CATH AND CORONARY ANGIOGRAPHY N/A 12/06/2017   Procedure: RIGHT/LEFT HEART CATH AND CORONARY ANGIOGRAPHY;  Surgeon: Yolonda Kida, MD;  Location: [place] [facility];  Service: Cardiovascular;  Laterality: N/A;  . TONSILLECTOMY      There were no vitals filed for this visit.  Subjective Assessment - 07/20/19 1138    Subjective  Pt reported she put heat on her back after yesterday's session. Pt reported she plans to go to the grocery store today after today's session.    Limitations  Sitting;Walking;Standing          [facility] PT Assessment - 07/20/19 1137      AROM         Palpation   Palpation comment  tightness along long interspinals,  along R medial scap     2/2 scoliosis  Hypomobility T10-L2                   [facility] Adult PT Treatment/Exercise - 07/20/19 1136      Therapeutic Activites    Other Therapeutic Activities  discussed going home to rest after today's session before going to the grocery store, explained the impact of upper R quadrant scoliosis related rotation which limits shoulder abduction / thoracic ext       Neuro Re-ed    Neuro Re-ed Details   cued for new stretch to promote length of posterior intercostals of R upper quadrant       Moist Heat Therapy   Moist Heat Location  Other (comment)   legs propped on stool, heat under back, not billed     Manual Therapy   Manual therapy comments  STM/ MWM along interspinals along R medial scap  Grade II-III mob at T10-L2 , modified position/ technique to minimizie radiation of pain B LE  PT Short Term Goals - 06/08/19 1133      PT SHORT TERM GOAL #1   Title  Patient will demonstrate improved pelvic alignment and balance of musculature surrounding the pelvis to facilitate decreased PFM spasms and decrease pelvic pain.    Baseline  Pt. demonstrates severe posterior pelvic tilt, L thoracic/R lumbar scoliosis, and R anterior/L posterior pelvic obliquity. Pt. has demonstrated improved pelvic alignment and posture but has not been able to sustain long-term due to continued lack of postural strength.    Time  5    Period  Weeks    Status  Achieved    Target Date  03/06/19      PT SHORT TERM GOAL #2   Title  Patient will demonstrate a coordinated contraction, relaxation, and bulge of the pelvic floor muscles to demonstrate functional recruitment and motion and allow for bladder emptying.    Baseline  Pt. is unable to fully empty bladder and describes pain with catheterization, demonstrating high  tone of PFM.    Time  5    Period  Weeks    Status  Achieved    Target Date  03/06/19      PT SHORT TERM GOAL #3   Title  Patient will demonstrate improved sitting and standing posture to demonstrate learning and decrease stress on the pelvic floor with functional activity.    Baseline  Pt. demonstrates severe posterior pelvic tilt, L thoracic/R lumbar scoliosis, and R anterior/L posterior pelvic obliquity. 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 Will be able to cath without pain and will be cleared by physiscian to d/c self-cath due to decreased residual volume with bladder emptying.    Baseline  required to cath 3 times per day, painful each time.    Time  10    Period  Weeks    Status  Achieved      PT LONG TERM GOAL #4   Title  Patient will score less than or equal to 40% on the Female NIH-CPSI and 30% on the [facility] to demonstrate a reduction in pain, urinary symptoms, and an improved quality of life.  Baseline  Female NIH-CPSI: 38/43 (88%) , PDI: 45/75 (60%); PDI 44/75 (59%) and Female  NIH-CPSI: 27/43 (64%) on 02-08-19 , Female NIH-CPSI: 17% on 03-26-19    Time  10    Period  Weeks    Status  Achieved      PT LONG TERM GOAL #5   Title  Patient will demonstrate increased step length with reciprical arm swing, and demonstrate no scissoring steps.    Baseline  Pt currently demonstrated scissoring steppage on R LE, increased trunk rotation and deep core engagement with reciprical walking poles.    Time  10    Period  Weeks    Status  Achieved      Additional Long Term Goals   Additional Long Term Goals  Yes      PT LONG TERM GOAL #6   Title  Pt will increase her distance on the 6MWT from 747 feet  to  > 900 feet  order to progress with aerobic exercises and walking longer distances in the community    Baseline  --    Time  8    Period  Weeks    Status  Revised    Target Date  09/14/19      PT LONG TERM GOAL #7   Title  Pt will report waking up > 2 mornings in a row with pain level < 5-6/10 in order to demo maintanence of Tx benefits and more balance of mm tensions achieved with HEP, shoe lift to improve QOL    Baseline  7-8/10 level and benefits from Tx lasted only one morning  (03-19-19 and 03-23-19)    Time  10    Period  Weeks    Status  Partially Met      PT LONG TERM GOAL #8   Title  Pt will be able to demo increased PF strength with unilateral UE support on wall from B 3/5 15 reps in order to achieve stronger push off in gait and walk longer distance    Baseline  B, L 7 reps  2/5, 10 reps R 3/5  ( 12/17: 15 reps R , 13 reps L, 06/01/19: R 20 reps, L 18 reps  )    Time  10    Period  Weeks    Status  Achieved      PT LONG TERM GOAL  #9   TITLE  Pt will demo shoulder abduction > 120 deg B with improved alignment of thorax over pelvis without manual cues in order to demo proper co-activation of B equal weight bearing in BLE, and decreased thoracic kyhposis/ decreased thoracic rotation 2/2 scoliosis in order to lift plates overhead cabinets/ decreased risk for     Time  10    Period  Weeks    Status  Achieved      PT LONG TERM GOAL  #10   TITLE  Pt will demo increased height from 63" to > 63 1/2 " and maintain 63 1/2" across 3 weeks ( once a week measurement) and forward head posture improve ( decreased distance from wall to earlobe 17 cm from earlobe to wall to > 12 cm)  in order to be ready to progress to PNF exercise with less shoulder pain and lengthen/ strengthen concave curve on R 2/2 scoliosis to minimize L lateral thigh tightness  (07/06/19 : 63 3/4" )    Time  8    Period  Weeks    Status  Partially Met  PT LONG TERM GOAL  #11   TITLE  Pt will demo single UE support with lunge position exercises/ yoga poses  in order to demo improved balance and IND with flexibility routine    Baseline  BUE support    Time  10    Period  Weeks    Status  New    Target Date  09/14/19            Plan - 07/20/19 1136    Clinical Impression Statement  Pt required manual Tx to lengthen intercostal space in R UE quadrant to promote thoracic extension, less R anterior rotation. Anticipate today's Tx will help pt achieve shoulder abduction > 120 deg without compensation, more thoracic extension and will help length R lumbar concave curve which in turn will help minimize radicular complaints. Pt continues to benefit from skilled PT   Personal Factors and Comorbidities  Age;Comorbidity 3+    Comorbidities  Osteoporosis, scoliosis, Arthritis, COPD, recent lumbosacral fusion for spondylolisthesis.    Examination-Activity Limitations  Toileting;Sit;Stand;Bend;Lift;Carry    Examination-Participation Restrictions  Interpersonal Relationship;Yard Work;Cleaning;Laundry    Stability/Clinical Decision Making  Unstable/Unpredictable    Rehab Potential  Good    PT Frequency  2x / week    PT Duration  Other (comment)   20   PT Treatment/Interventions  ADLs/Self Care Home Management;Aquatic Therapy;Moist Heat;Electrical Stimulation;Traction;Therapeutic  activities;Functional mobility training;Stair training;Gait training;Therapeutic exercise;Balance training;Neuromuscular re-education;Patient/family education;Manual techniques;Dry needling;Passive range of motion;Scar mobilization;Taping    Consulted and Agree with Plan of Care  Patient       Patient will benefit from skilled therapeutic intervention in order to improve the following deficits and impairments:  Abnormal gait, Decreased balance, Increased muscle spasms, Decreased range of motion, Decreased scar mobility, Improper body mechanics, Decreased coordination, Decreased strength, Increased fascial restricitons, Impaired flexibility, Postural dysfunction, Pain  Visit Diagnosis: Abnormal posture  Chronic right shoulder pain  Other muscle spasm     Problem List Patient Active Problem List   Diagnosis Date Noted  . Chronic UTI 12/06/2018  . Status post lumbar surgery 12/06/2018  . Spondylolisthesis, lumbar region 09/26/2018  . Aortic atherosclerosis ([place]) 08/31/2018  . CAD (coronary artery disease) 02/23/2018  . Advanced care planning/counseling discussion 03/28/2017  . Varicose veins of both lower extremities with complications 37/79/3968  . Arthritis 12/09/2016  . Vaginal atrophy 09/10/2015  . Menopausal hot flushes 09/10/2015  . Onychomycosis due to dermatophyte 04/07/2015  . Bursitis of right shoulder 02/26/2015  . Environmental and seasonal allergies 02/26/2015  . Other allergic rhinitis 02/26/2015  . Osteoporosis 11/26/2014  . COPD (chronic obstructive pulmonary disease) ([place]) 11/26/2014  . Fuchs' corneal dystrophy 11/26/2014  . Benign hypertensive renal disease 11/26/2014  . Hyperlipidemia 11/26/2014  . Chronic kidney disease, stage III (moderate) ([place]) 11/26/2014  . Hypothyroidism 11/26/2014  . Endothelial corneal dystrophy 11/26/2014    Jerl Mina ,PT, DPT, E-RYT  07/20/2019, 11:41 AM  [place] MAIN [facility]  SERVICES [address] [place], [place], [address] Phone: [phone]   Fax:  [phone]  Name: Julia Deleon MRN: [id number] Date of Birth: 1946/04/29

## 2019-07-23 ENCOUNTER — Other Ambulatory Visit: Payer: Self-pay

## 2019-07-23 ENCOUNTER — Ambulatory Visit: Payer: Medicare Other | Admitting: Physical Therapy

## 2019-07-23 DIAGNOSIS — G8929 Other chronic pain: Secondary | ICD-10-CM | POA: Diagnosis not present

## 2019-07-23 DIAGNOSIS — M25511 Pain in right shoulder: Secondary | ICD-10-CM | POA: Diagnosis not present

## 2019-07-23 DIAGNOSIS — R2689 Other abnormalities of gait and mobility: Secondary | ICD-10-CM | POA: Diagnosis not present

## 2019-07-23 DIAGNOSIS — R293 Abnormal posture: Secondary | ICD-10-CM | POA: Diagnosis not present

## 2019-07-23 DIAGNOSIS — M62838 Other muscle spasm: Secondary | ICD-10-CM | POA: Diagnosis not present

## 2019-07-23 NOTE — Patient Instructions (Signed)
Working the L midback curve   Bear stretch by door way  Mini squat position Roll over edge of doorway to release   __  Standing with L hand at shoulder height Push into doorway,  Pulsing 10 reps

## 2019-07-23 NOTE — Therapy (Addendum)
[place] MAIN [facility] SERVICES [address] [place], [place], [address] Phone: [phone]   Fax:  [phone]  Physical Therapy Treatment  Patient Details  Name: Julia Deleon MRN: [id number] Date of Birth: 1946/01/31 Referring Provider (PT): Dr. Maryan Puls   Encounter Date: 07/23/2019  PT End of Session - 07/23/19 1410    Visit Number  [id number]    Date for PT Re-Evaluation  09/11/19   Progress note to report #74 (last on #64 07/06/19)   Authorization Type  Medicare    PT Start Time  1110    PT Stop Time  1210    PT Time Calculation (min)  60 min    Activity Tolerance  Patient tolerated treatment well;No increased pain    Behavior During Therapy  WFL for tasks assessed/performed       Past Medical History:  Diagnosis Date  . Arthritis   . COPD (chronic obstructive pulmonary disease) ([place])   . Family history of adverse reaction to anesthesia    father was slow to wake up  . Fuch's endothelial dystrophy   . Hyperlipidemia   . Hypothyroidism   . PONV (postoperative nausea and vomiting)    slow to wake up and PONV    Past Surgical History:  Procedure Laterality Date  . APPENDECTOMY    . BACK SURGERY  09/26/2018   L4-5 PLIF by Dr. Arnoldo Morale  . CARDIAC CATHETERIZATION    . CHOLECYSTECTOMY    . EYE SURGERY    . FOOT SURGERY Right    pin removed left  . laser vein surgery    . NASAL SINUS SURGERY    . RIGHT/LEFT HEART CATH AND CORONARY ANGIOGRAPHY N/A 12/06/2017   Procedure: RIGHT/LEFT HEART CATH AND CORONARY ANGIOGRAPHY;  Surgeon: Yolonda Kida, MD;  Location: [facility];  Service: Cardiovascular;  Laterality: N/A;  . TONSILLECTOMY      There were no vitals filed for this visit.  Subjective Assessment - 07/23/19 1110    Subjective  Pt went to sleep one more hour later to account for the change in daylight savings time. Pt noticed her legs were not as tight upon waking.    Limitations  Sitting;Walking;Standing          [facility] PT Assessment - 07/23/19 1117      Observation/Other Assessments   Observations  sidelying with L trunk rotation causes radiating pain down L leg below knee. Position on back releiveed radiating pain.        Posture/Postural Control   Posture Comments  plantigrade position, propped on forearm, no concave curve on R, no posterior lumbar rotation.       AROM   Overall AROM Comments  55 deg shoulder abduction with back against the wall in wall squat position      Palpation   Spinal mobility  hypomobility at L T5-7 convex, interspinal mm tightness L , T10 rib intercostal space anteriolateral with tenderness/ tightness    palpation at interspinals T5-7 caused radiating pain                   [facility] Adult PT Treatment/Exercise - 07/23/19 1117      Manual Therapy   Manual therapy comments  STM/MWM L intercostal  T10 anterolateral, mm medial to L scapula                 PT Short Term Goals - 06/08/19 1133      PT SHORT TERM  GOAL #1   Title  Patient will demonstrate improved pelvic alignment and balance of musculature surrounding the pelvis to facilitate decreased PFM spasms and decrease pelvic pain.    Baseline  Pt. demonstrates severe posterior pelvic tilt, L thoracic/R lumbar scoliosis, and R anterior/L posterior pelvic obliquity. Pt. has demonstrated improved pelvic alignment and posture but has not been able to sustain long-term due to continued lack of postural strength.    Time  5    Period  Weeks    Status  Achieved    Target Date  03/06/19      PT SHORT TERM GOAL #2   Title  Patient will demonstrate a coordinated contraction, relaxation, and bulge of the pelvic floor muscles to demonstrate functional recruitment and motion and allow for bladder emptying.    Baseline  Pt. is unable to fully empty bladder and describes pain with catheterization, demonstrating high tone of PFM.    Time  5    Period  Weeks    Status  Achieved    Target Date   03/06/19      PT SHORT TERM GOAL #3   Title  Patient will demonstrate improved sitting and standing posture to demonstrate learning and decrease stress on the pelvic floor with functional activity.    Baseline  Pt. demonstrates severe posterior pelvic tilt, L thoracic/R lumbar scoliosis, and R anterior/L posterior pelvic obliquity. 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 Will be able to cath without pain and will be cleared by physiscian to d/c self-cath due to decreased residual volume with bladder emptying.    Baseline  required to cath 3 times per day, painful each time.    Time  10    Period  Weeks    Status  Achieved      PT LONG TERM GOAL #4   Title  Patient will score less than or equal to 40% on the Female NIH-CPSI and 30% on the [facility] to demonstrate a reduction in pain, urinary symptoms, and an improved quality of life.    Baseline  Female NIH-CPSI: 38/43 (88%) , PDI: 45/75 (60%); PDI 44/75 (59%)  and Female NIH-CPSI: 27/43 (64%) on 02-08-19 , Female NIH-CPSI: 17% on 03-26-19    Time  10     Period  Weeks    Status  Achieved      PT LONG TERM GOAL #5   Title  Patient will demonstrate increased step length with reciprical arm swing, and demonstrate no scissoring steps.    Baseline  Pt currently demonstrated scissoring steppage on R LE, increased trunk rotation and deep core engagement with reciprical walking poles.    Time  10    Period  Weeks    Status  Achieved      Additional Long Term Goals   Additional Long Term Goals  --      PT LONG TERM GOAL #6   Title  Pt will increase her distance on the 6MWT from 747 feet  to  > 900 feet  order to progress with aerobic exercises and walking longer distances in the community    Time  8    Period  Weeks    Status  Revised      PT LONG TERM GOAL #7   Title  Pt will report waking up > 2 mornings in a row with pain level < 5-6/10 in order to demo maintanence of Tx benefits and more balance of mm tensions achieved with HEP, shoe lift to improve QOL    Baseline  7-8/10 level and benefits from Tx lasted only one morning  (03-19-19 and 03-23-19)    Time  10    Period  Weeks    Status  Partially Met      PT LONG TERM GOAL #8   Title  Pt will be able to demo increased PF strength with unilateral UE support on wall from B 3/5 15 reps in order to achieve stronger push off in gait and walk longer distance    Baseline  B, L 7 reps  2/5, 10 reps R 3/5  ( 12/17: 15 reps R , 13 reps L, 06/01/19: R 20 reps, L 18 reps  )    Time  10    Period  Weeks    Status  Achieved      PT LONG TERM GOAL  #9   TITLE  Pt will demo shoulder abduction > 120 deg B with improved alignment of thorax over pelvis without manual cues in order to demo proper co-activation of B equal weight bearing in BLE, and decreased thoracic kyhposis/ decreased thoracic rotation 2/2 scoliosis in order to lift plates overhead cabinets/ decreased risk for    Time  10    Period  Weeks    Status  Achieved      PT LONG TERM GOAL  #10   TITLE  Pt will demo increased height from  63" to > 63 1/2 " and maintain 63 1/2" across 3 weeks ( once a week measurement) and forward head posture improve ( decreased distance from wall to earlobe 17 cm from earlobe to wall to > 12 cm)  in order to be ready to progress to PNF exercise with less shoulder pain and lengthen/ strengthen concave curve on R 2/2 scoliosis to minimize L lateral thigh tightness  (07/06/19 : 63 3/4" )    Time  8    Period  Weeks    Status  Partially Met      PT LONG TERM GOAL  #11   TITLE  Pt will demo single UE support with lunge position exercises/ yoga poses  in order to demo improved balance and IND with flexibility routine    Baseline  BUE support    Time  10    Period  Weeks    Status  New      PT LONG TERM GOAL  #12   TITLE  Pt will demo L trunk rotation in R sidelying with zero report of pain in order to progress with longer walking endurance    Time  10    Period  Weeks    Status  New    Target Date  10/01/19      PT LONG TERM GOAL  #13   TITLE  Pt will demo increased  shoulder abd R against wall in mini squat from 55 deg to > 75 deg in order to demo increased thoracic extension and intercostal lengthening for functional BUE mobility without compensations    Time  8    Period  Weeks    Status  New    Target Date  09/17/19            Plan - 07/23/19 1412    Clinical Impression Statement Pt demo'd less concavity on R lumbar area in plantigrade position which indicates manual Tx and customized HEP are helping address pt's scoliosis on this side. Today, manual Tx addressed L medial scapular area along with tight intercostal mm at T10 level where convex curve lies. Following Tx, pt demo'd increased thoracic extensions and improved scapular depression. Plan to continue minimizing pt's thoracic kyphosis and increasing length and mobility to area of concavity/ convexity. Reproduction of radiating pain occurred with palpation at this area. Decreased radiating pain intensity occurred post Tx at this  area. Anticipate pt will be able to demo L trunk rotation in R sidelying with less report of radiating pain after further manual and lengthening/ strengthening occurs at the T/L junction.  Pt continues to benefit from skilled PT.    Personal Factors and Comorbidities  Age;Comorbidity 3+    Comorbidities  Osteoporosis, scoliosis, Arthritis, COPD, recent lumbosacral fusion for spondylolisthesis.    Examination-Activity Limitations  Toileting;Sit;Stand;Bend;Lift;Carry    Examination-Participation Restrictions  Interpersonal Relationship;Yard Work;Cleaning;Laundry    Stability/Clinical Decision Making  Unstable/Unpredictable    Rehab Potential  Good    PT Frequency  2x / week    PT Duration  Other (comment)   20   PT Treatment/Interventions  ADLs/Self Care Home Management;Aquatic Therapy;Moist Heat;Electrical Stimulation;Traction;Therapeutic activities;Functional mobility training;Stair training;Gait training;Therapeutic exercise;Balance training;Neuromuscular re-education;Patient/family education;Manual techniques;Dry needling;Passive range of motion;Scar mobilization;Taping    Consulted and Agree with Plan of Care  Patient       Patient will benefit from skilled therapeutic intervention in order to improve the following deficits and impairments:  Abnormal gait, Decreased balance, Increased muscle spasms, Decreased range of motion, Decreased scar mobility, Improper body mechanics, Decreased coordination, Decreased strength, Increased fascial restricitons, Impaired flexibility, Postural dysfunction, Pain  Visit Diagnosis: Abnormal posture  Chronic right shoulder pain  Other muscle spasm     Problem List Patient Active Problem List   Diagnosis Date Noted  . Chronic UTI 12/06/2018  . Status post lumbar surgery 12/06/2018  . Spondylolisthesis, lumbar region 09/26/2018  . Aortic atherosclerosis ([place]) 08/31/2018  . CAD (coronary artery disease) 02/23/2018  . Advanced care  planning/counseling discussion 03/28/2017  . Varicose veins of both lower extremities with complications 67/89/3810  . Arthritis 12/09/2016  . Vaginal atrophy 09/10/2015  . Menopausal hot flushes 09/10/2015  . Onychomycosis due to dermatophyte 04/07/2015  . Bursitis of right  shoulder 02/26/2015  . Environmental and seasonal allergies 02/26/2015  . Other allergic rhinitis 02/26/2015  . Osteoporosis 11/26/2014  . COPD (chronic obstructive pulmonary disease) ([place]) 11/26/2014  . Fuchs' corneal dystrophy 11/26/2014  . Benign hypertensive renal disease 11/26/2014  . Hyperlipidemia 11/26/2014  . Chronic kidney disease, stage III (moderate) ([place]) 11/26/2014  . Hypothyroidism 11/26/2014  . Endothelial corneal dystrophy 11/26/2014    Jerl Mina ,PT, DPT, [id number]  07/23/2019, 3:08 PM  [place] MAIN [facility] SERVICES [address] [place], [place], [address] Phone: [phone]   Fax:  [phone]  Name: Julia Deleon MRN: [id number] Date of Birth: 11-26-1945

## 2019-07-24 ENCOUNTER — Encounter: Payer: Medicare Other | Admitting: Physical Therapy

## 2019-07-24 DIAGNOSIS — R030 Elevated blood-pressure reading, without diagnosis of hypertension: Secondary | ICD-10-CM | POA: Diagnosis not present

## 2019-07-24 DIAGNOSIS — M5432 Sciatica, left side: Secondary | ICD-10-CM | POA: Diagnosis not present

## 2019-07-24 DIAGNOSIS — M4316 Spondylolisthesis, lumbar region: Secondary | ICD-10-CM | POA: Diagnosis not present

## 2019-07-24 DIAGNOSIS — Z6825 Body mass index (BMI) 25.0-25.9, adult: Secondary | ICD-10-CM | POA: Insufficient documentation

## 2019-07-27 ENCOUNTER — Encounter: Payer: Medicare Other | Admitting: Physical Therapy

## 2019-07-27 ENCOUNTER — Other Ambulatory Visit: Payer: Self-pay

## 2019-07-27 ENCOUNTER — Ambulatory Visit: Payer: Medicare Other | Admitting: Physical Therapy

## 2019-07-27 DIAGNOSIS — M62838 Other muscle spasm: Secondary | ICD-10-CM

## 2019-07-27 DIAGNOSIS — G8929 Other chronic pain: Secondary | ICD-10-CM

## 2019-07-27 DIAGNOSIS — R2689 Other abnormalities of gait and mobility: Secondary | ICD-10-CM | POA: Diagnosis not present

## 2019-07-27 DIAGNOSIS — R293 Abnormal posture: Secondary | ICD-10-CM | POA: Diagnosis not present

## 2019-07-27 DIAGNOSIS — M25511 Pain in right shoulder: Secondary | ICD-10-CM | POA: Diagnosis not present

## 2019-07-27 NOTE — Therapy (Signed)
[place] MAIN [facility] SERVICES [address] [place], [place], [address] Phone: [phone]   Fax:  [phone]  Physical Therapy Treatment  Patient Details  Name: Julia Deleon MRN: [id number] Date of Birth: 24-Sep-1945 Referring Provider (PT): Dr. Maryan Puls   Encounter Date: 07/27/2019  PT End of Session - 07/27/19 1117    Visit Number  [id number]    Date for PT Re-Evaluation  09/11/19   Progress note to report #74 (last on #64 07/06/19)   Authorization Type  Medicare    PT Start Time  1005    PT Stop Time  1115    PT Time Calculation (min)  70 min    Activity Tolerance  Patient tolerated treatment well;No increased pain    Behavior During Therapy  WFL for tasks assessed/performed       Past Medical History:  Diagnosis Date  . Arthritis   . COPD (chronic obstructive pulmonary disease) ([place])   . Family history of adverse reaction to anesthesia    father was slow to wake up  . Fuch's endothelial dystrophy   . Hyperlipidemia   . Hypothyroidism   . PONV (postoperative nausea and vomiting)    slow to wake up and PONV    Past Surgical History:  Procedure Laterality Date  . APPENDECTOMY    . BACK SURGERY  09/26/2018   L4-5 PLIF by Dr. Arnoldo Morale  . CARDIAC CATHETERIZATION    . CHOLECYSTECTOMY    . EYE SURGERY    . FOOT SURGERY Right    pin removed left  . laser vein surgery    . NASAL SINUS SURGERY    . RIGHT/LEFT HEART CATH AND CORONARY ANGIOGRAPHY N/A 12/06/2017   Procedure: RIGHT/LEFT HEART CATH AND CORONARY ANGIOGRAPHY;  Surgeon: Yolonda Kida, MD;  Location: [facility];  Service: Cardiovascular;  Laterality: N/A;  . TONSILLECTOMY      There were no vitals filed for this visit.  Subjective Assessment - 07/27/19 1013    Subjective  Pt reports she saw her surgeon and she will get another MRI to assess nerve impingement. Surgeon explained the possibility of performing nerve ablation to relive pain.  Pt still wakes up with  nerve pain but she does have one morning with less pain out of every week the last couple of weeks.  Pt does not have the knee as much.    Limitations  Sitting;Walking;Standing         [facility] PT Assessment - 07/27/19 1130      Other:   Other/ Comments  clams with red band caused radiating pain in sidelying but not in hooklying      Posture/Postural Control   Posture Comments  walking with resistance ( lats), required cues for eccentric control of PF/ calves                    [facility] Adult PT Treatment/Exercise - 07/27/19 1130      Therapeutic Activites    Other Therapeutic Activities  explained the progression of new HEP, discussed pt's surgeon's  plans, explained posterior tilt of pelvis causing radiating pain and how to maintain pelvic neutral with new HEP       Neuro Re-ed    Neuro Re-ed Details   ceued for eccentric control of PF and anterior COM with walking using resistance bands , cued for less posterior tilt of pelvic in isometric exercise in corner  PT Short Term Goals - 06/08/19 1133      PT SHORT TERM GOAL #1   Title  Patient will demonstrate improved pelvic alignment and balance of musculature surrounding the pelvis to facilitate decreased PFM spasms and decrease pelvic pain.    Baseline  Pt. demonstrates severe posterior pelvic tilt, L thoracic/R lumbar scoliosis, and R anterior/L posterior pelvic obliquity. Pt. has demonstrated improved pelvic alignment and posture but has not been able to sustain long-term due to continued lack of postural strength.    Time  5    Period  Weeks    Status  Achieved    Target Date  03/06/19      PT SHORT TERM GOAL #2   Title  Patient will demonstrate a coordinated contraction, relaxation, and bulge of the pelvic floor muscles to demonstrate functional recruitment and motion and allow for bladder emptying.    Baseline  Pt. is unable to fully empty bladder and describes pain with catheterization,  demonstrating high tone of PFM.    Time  5    Period  Weeks    Status  Achieved    Target Date  03/06/19      PT SHORT TERM GOAL #3   Title  Patient will demonstrate improved sitting and standing posture to demonstrate learning and decrease stress on the pelvic floor with functional activity.    Baseline  Pt. demonstrates severe posterior pelvic tilt, L thoracic/R lumbar scoliosis, and R anterior/L posterior pelvic obliquity. 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 Will be able to cath without pain and will be cleared by physiscian to d/c self-cath due to decreased residual volume with bladder emptying.    Baseline  required to cath 3 times per day, painful each time.    Time  10    Period  Weeks    Status  Achieved      PT LONG TERM GOAL #4   Title  Patient will score less than or equal to 40% on the Female NIH-CPSI and 30% on the [facility] to demonstrate a reduction in pain, urinary symptoms, and an improved quality of life.  Baseline  Female NIH-CPSI: 38/43 (88%) , PDI: 45/75 (60%); PDI  44/75 (59%) and Female NIH-CPSI: 27/43 (64%) on 02-08-19 , Female NIH-CPSI: 17% on 03-26-19    Time  10    Period  Weeks    Status  Achieved      PT LONG TERM GOAL #5   Title  Patient will demonstrate increased step length with reciprical arm swing, and demonstrate no scissoring steps.    Baseline  Pt currently demonstrated scissoring steppage on R LE, increased trunk rotation and deep core engagement with reciprical walking poles.    Time  10    Period  Weeks    Status  Achieved      Additional Long Term Goals   Additional Long Term Goals  --      PT LONG TERM GOAL #6   Title  Pt will increase her distance on the 6MWT from 747 feet  to  > 900 feet  order to progress with aerobic exercises and walking longer distances in the community    Time  8    Period  Weeks    Status  Revised      PT LONG TERM GOAL #7   Title  Pt will report waking up > 2 mornings in a row with pain level < 5-6/10 in order to demo maintanence of Tx benefits and more balance of mm tensions achieved with HEP, shoe lift to improve QOL    Baseline  7-8/10 level and benefits from Tx lasted only one morning  (03-19-19 and 03-23-19)    Time  10    Period  Weeks    Status  Partially Met      PT LONG TERM GOAL #8   Title  Pt will be able to demo increased PF strength with unilateral UE support on wall from B 3/5 15 reps in order to achieve stronger push off in gait and walk longer distance    Baseline  B, L 7 reps  2/5, 10 reps R 3/5  ( 12/17: 15 reps R , 13 reps L, 06/01/19: R 20 reps, L 18 reps  )    Time  10    Period  Weeks    Status  Achieved      PT LONG TERM GOAL  #9   TITLE  Pt will demo shoulder abduction > 120 deg B with improved alignment of thorax over pelvis without manual cues in order to demo proper co-activation of B equal weight bearing in BLE, and decreased thoracic kyhposis/ decreased thoracic rotation 2/2 scoliosis in order to lift plates overhead cabinets/ decreased risk for    Time  10    Period   Weeks    Status  Achieved      PT LONG TERM GOAL  #10   TITLE  Pt will demo increased height from 63" to > 63 1/2 " and maintain 63 1/2" across 3 weeks ( once a week measurement) and forward head posture improve ( decreased distance from wall to earlobe 17 cm from earlobe to wall to > 12 cm)  in order to be ready to progress to PNF exercise with less shoulder pain and lengthen/ strengthen concave curve on R 2/2 scoliosis to minimize L lateral thigh tightness  (07/06/19 : 63 3/4" )    Time  8    Period  Weeks    Status  Partially Met      PT LONG TERM GOAL  #11   TITLE  Pt will demo single UE support with lunge position exercises/ yoga poses  in order to demo improved balance and IND with flexibility routine    Baseline  BUE support    Time  10    Period  Weeks    Status  New      PT LONG TERM GOAL  #12   TITLE  Pt will demo L trunk rotation in R sidelying with zero report of pain in order to progress with longer walking endurance    Time  10    Period  Weeks    Status  New    Target Date  10/01/19      PT LONG TERM GOAL  #13   TITLE  Pt will demo increased  shoulder abd R against wall in mini squat from 55 deg to > 75 deg in order to demo increased thoracic extension and intercostal lengthening for functional BUE mobility without compensations    Time  8    Period  Weeks    Status  New    Target Date  09/17/19            Plan - 07/27/19 1130    Clinical Impression Statement Pt is able to roll L /R on the plinth without c/o radiating pain and demo increased flexibility. Pt demo'd less R trunk lean in gait.    Pt reports she has experienced one day out 7 days where she woke up with less radiating pain in L thigh fr the past couple of weeks.   Today, replaced clams with resistance band in hooklying position because sidelying position caused tightness/ radiating pain.  Progressed pt from standing exercises with support under cabinet to standing in corner by wall to improve  propioception to have less posterior pelvic tilt and optimize more extension of trunk. Added resistance band walking forward and backward with latissimus and extensor mm  strengthening which caused tightness bilaterally but not intense radiating pain. Provided excessive cues for more anterior COM, more transverse arch, plantar arch co-activation , eccentric control of PF/ calves with back ward walking with resistance band.  Pt continues to benefit from skilled PT     Personal Factors and Comorbidities  Age;Comorbidity 3+    Comorbidities  Osteoporosis, scoliosis, Arthritis, COPD, recent lumbosacral fusion for spondylolisthesis.    Examination-Activity Limitations  Toileting;Sit;Stand;Bend;Lift;Carry    Examination-Participation Restrictions  Interpersonal Relationship;Yard Work;Cleaning;Laundry    Stability/Clinical Decision Making  Unstable/Unpredictable    Rehab Potential  Good    PT Frequency  2x / week    PT Duration  Other (comment)   20   PT Treatment/Interventions  ADLs/Self Care Home Management;Aquatic Therapy;Moist Heat;Electrical Stimulation;Traction;Therapeutic activities;Functional mobility training;Stair training;Gait training;Therapeutic exercise;Balance training;Neuromuscular re-education;Patient/family education;Manual techniques;Dry needling;Passive range of motion;Scar mobilization;Taping    Consulted and Agree with Plan of Care  Patient       Patient will benefit from skilled therapeutic intervention in order to improve the following deficits and impairments:  Abnormal gait, Decreased balance, Increased muscle spasms, Decreased range of motion, Decreased scar mobility, Improper body mechanics, Decreased coordination, Decreased strength, Increased fascial restricitons, Impaired flexibility, Postural dysfunction, Pain  Visit Diagnosis: Abnormal posture  Chronic right shoulder pain  Other muscle spasm     Problem List Patient Active Problem List   Diagnosis Date Noted   . Chronic UTI 12/06/2018  . Status post lumbar surgery 12/06/2018  . Spondylolisthesis, lumbar region 09/26/2018  . Aortic atherosclerosis ([place]) 08/31/2018  . CAD (coronary artery disease) 02/23/2018  .  Advanced care planning/counseling discussion 03/28/2017  . Varicose veins of both lower extremities with complications 73/42/8768  . Arthritis 12/09/2016  . Vaginal atrophy 09/10/2015  . Menopausal hot flushes 09/10/2015  . Onychomycosis due to dermatophyte 04/07/2015  . Bursitis of right shoulder 02/26/2015  . Environmental and seasonal allergies 02/26/2015  . Other allergic rhinitis 02/26/2015  . Osteoporosis 11/26/2014  . COPD (chronic obstructive pulmonary disease) ([place]) 11/26/2014  . Fuchs' corneal dystrophy 11/26/2014  . Benign hypertensive renal disease 11/26/2014  . Hyperlipidemia 11/26/2014  . Chronic kidney disease, stage III (moderate) ([place]) 11/26/2014  . Hypothyroidism 11/26/2014  . Endothelial corneal dystrophy 11/26/2014    Jerl Mina ,PT, DPT, E-RYT  07/27/2019, 11:34 AM  [place] MAIN [facility] SERVICES [address] [place], [place], [address] Phone: [phone]   Fax:  [phone]  Name: Disney C Dugal MRN: [id number] Date of Birth: 10-26-1945

## 2019-07-27 NOTE — Patient Instructions (Addendum)
Stop clam shells   Instead use red band at thighs above knee and perform deep core level 2 ( 6 min) with band Pay attention to stabilizing with feet  Less pelvic rocking   ____  Stop the exercise , hands on cabinet,   Instead,  Stand in the corner of a room, press elbow out against wall while pressing downward with the feet to straighten knees    ____     WALKING WITH RESISTANCE BLUE Band at waist connected to doorknob 20mins Stepping forward normal length steps, planting mid and forefoot down, center of mass ( navel) leans forward slightly as if you were walking uphill 3-4 steps till band feels taut ( MAKE SURE THE DOOR IS LOCKED AND WON'T OPEN)   Stepping backwards, lower heel slowly, carry trunk and hips back as you step

## 2019-07-30 NOTE — Therapy (Addendum)
[place] MAIN [facility] SERVICES [address] [place], [place], [address] Phone: [phone]   Fax:  [phone]  Physical Therapy Treatment  Patient Details  Name: Julia Deleon   Encounter Date: 07/27/2019    Past Medical History:  Diagnosis Date  . Arthritis   . COPD (chronic obstructive pulmonary disease) ([place])   . Family history of adverse reaction to anesthesia    father was slow to wake up  . Fuch's endothelial dystrophy   . Hyperlipidemia   . Hypothyroidism   . PONV (postoperative nausea and vomiting)    slow to wake up and PONV    Past Surgical History:  Procedure Laterality Date  . APPENDECTOMY    . BACK SURGERY  09/26/2018   L4-5 PLIF by Dr. Arnoldo Morale  . CARDIAC CATHETERIZATION    . CHOLECYSTECTOMY    . EYE SURGERY    . FOOT SURGERY Right    pin removed left  . laser vein surgery    . NASAL SINUS SURGERY    . RIGHT/LEFT HEART CATH AND CORONARY ANGIOGRAPHY N/A 12/06/2017   Procedure: RIGHT/LEFT HEART CATH AND CORONARY ANGIOGRAPHY;  Surgeon: Yolonda Kida, MD;  Location: [place] [facility];  Service: Cardiovascular;  Laterality: N/A;  . TONSILLECTOMY      There were no vitals filed for this visit.  Subjective Assessment - 07/30/19 0925    Subjective  Pt reports she saw her surgeon and she will get another MRI to assess nerve impingement. Surgeon explained the possibility of performing nerve ablation to relive pain.  Pt still wakes up with nerve pain but she does have one morning with less pain out of every week the last couple of weeks.  Pt does not have the knee as much.    Limitations  Sitting;Walking;Standing         [facility] PT Assessment - 07/30/19 6962      Other:   Other/ Comments  clams with red band caused radiating pain in sidelying but not in hooklying      Posture/Postural Control   Posture Comments  walking with resistance ( lats), required cues for eccentric control of PF/ calves           Tx:    neuromuscular re-edu:   Cued for proper technique in standing position in isometric exercise that promotes more scap retraction/ depression with knee ext      therapeutic activities:  Modified and downgraded exercises to minimize radiating pain Explained the progression of exercises, simplified and replaced other exercises to prmote more spinal extension/ scapular depression/ retraction with lower kinetic chain co-activation   Reinforced pt's improvements with ability to roll over without onset of radiating pain                PT Short Term Goals - 06/08/19 1133      PT SHORT TERM GOAL #1   Title  Patient will demonstrate improved pelvic alignment and balance of musculature surrounding the pelvis to facilitate decreased PFM spasms and decrease pelvic pain.    Baseline  Pt. demonstrates severe posterior pelvic tilt, L thoracic/R lumbar scoliosis, and R anterior/L posterior pelvic obliquity. Pt. has demonstrated improved pelvic alignment and posture but has not been able to sustain long-term due to continued lack of postural strength.    Time  5    Period  Weeks    Status  Achieved    Target Date  03/06/19      PT SHORT  TERM GOAL #2   Title  Patient will demonstrate a coordinated contraction, relaxation, and bulge of the pelvic floor muscles to demonstrate functional recruitment and motion and allow for bladder emptying.    Baseline  Pt. is unable to fully empty bladder and describes pain with catheterization, demonstrating high tone of PFM.    Time  5    Period  Weeks    Status  Achieved    Target Date  03/06/19      PT SHORT TERM GOAL #3   Title  Patient will demonstrate improved sitting and standing posture to demonstrate learning and decrease stress on the pelvic floor with functional activity.    Baseline  Pt. demonstrates severe posterior pelvic tilt, L thoracic/R lumbar scoliosis, and R anterior/L posterior pelvic obliquity. 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 Will be able to cath without pain and will be cleared by physiscian to d/c self-cath due to decreased residual volume with bladder emptying.    Baseline  required to cath 3 times per day, painful each time.    Time  10    Period  Weeks    Status  Achieved      PT LONG TERM GOAL #4   Title  Patient will score less than or equal to 40% on the Female NIH-CPSI and 30% on the [facility] to demonstrate a reduction in pain, urinary symptoms, and an improved quality of life.    Baseline  Female NIH-CPSI: 38/43 (88%) , PDI: 45/75 (60%); PDI 44/75 (59%) and Female NIH-CPSI: 27/43 (64%) on 02-08-19 , Female NIH-CPSI: 17% on 03-26-19    Time  10    Period  Weeks    Status  Achieved      PT LONG TERM GOAL #5   Title  Patient will demonstrate increased step length with reciprical arm swing, and demonstrate no scissoring steps.    Baseline  Pt currently demonstrated scissoring steppage on R LE, increased trunk rotation and deep core engagement with reciprical walking poles.    Time  10    Period  Weeks     Status  Achieved  Additional Long Term Goals   Additional Long Term Goals  --      PT LONG TERM GOAL #6   Title  Pt will increase her distance on the 6MWT from 747 feet  to  > 900 feet  order to progress with aerobic exercises and walking longer distances in the community    Time  8    Period  Weeks    Status  Revised      PT LONG TERM GOAL #7   Title  Pt will report waking up > 2 mornings in a row with pain level < 5-6/10 in order to demo maintanence of Tx benefits and more balance of mm tensions achieved with HEP, shoe lift to improve QOL    Baseline  7-8/10 level and benefits from Tx lasted only one morning  (03-19-19 and 03-23-19)    Time  10    Period  Weeks    Status  Partially Met      PT LONG TERM GOAL #8   Title  Pt will be able to demo increased PF strength with unilateral UE support on wall from B 3/5 15 reps in order to achieve stronger push off in gait and walk longer distance    Baseline  B, L 7 reps  2/5, 10 reps R 3/5  ( 12/17: 15 reps R , 13 reps L, 06/01/19: R 20 reps, L 18 reps  )    Time  10    Period  Weeks    Status  Achieved      PT LONG TERM GOAL  #9   TITLE  Pt will demo shoulder abduction > 120 deg B with improved alignment of thorax over pelvis without manual cues in order to demo proper co-activation of B equal weight bearing in BLE, and decreased thoracic kyhposis/ decreased thoracic rotation 2/2 scoliosis in order to lift plates overhead cabinets/ decreased risk for    Time  10    Period  Weeks    Status  Achieved      PT LONG TERM GOAL  #10   TITLE  Pt will demo increased height from 63" to > 63 1/2 " and maintain 63 1/2" across 3 weeks ( once a week measurement) and forward head posture improve ( decreased distance from wall to earlobe 17 cm from earlobe to wall to > 12 cm)  in order to be ready to progress to PNF exercise with less shoulder pain and lengthen/ strengthen concave curve on R 2/2 scoliosis to minimize L lateral thigh tightness  (07/06/19  : 63 3/4" )    Time  8    Period  Weeks    Status  Partially Met      PT LONG TERM GOAL  #11   TITLE  Pt will demo single UE support with lunge position exercises/ yoga poses  in order to demo improved balance and IND with flexibility routine    Baseline  BUE support    Time  10    Period  Weeks    Status  New      PT LONG TERM GOAL  #12   TITLE  Pt will demo L trunk rotation in R sidelying with zero report of pain in order to progress with longer walking endurance    Time  10    Period  Weeks    Status  New    Target Date  10/01/19      PT LONG TERM GOAL  #  13   TITLE  Pt will demo increased  shoulder abd R against wall in mini squat from 55 deg to > 75 deg in order to demo increased thoracic extension and intercostal lengthening for functional BUE mobility without compensations    Time  8    Period  Weeks    Status  New    Target Date  09/17/19            Plan - 07/30/19 0925    Clinical Impression Statement  Pt progressed to upright isometric exercises to continue promote scapular retraction/ depression/ spinal extensions with increased lower kinetic chain co-activation. Pt requried skilled PT to modify and replace past exercises to minimize radiating pain. Pt continues to benefit from skilled PT.     Personal Factors and Comorbidities  Age;Comorbidity 3+    Comorbidities  Osteoporosis, scoliosis, Arthritis, COPD, recent lumbosacral fusion for spondylolisthesis.    Examination-Activity Limitations  Toileting;Sit;Stand;Bend;Lift;Carry    Examination-Participation Restrictions  Interpersonal Relationship;Yard Work;Cleaning;Laundry    Stability/Clinical Decision Making  Unstable/Unpredictable    Rehab Potential  Good    PT Frequency  2x / week    PT Duration  Other (comment)   20   PT Treatment/Interventions  ADLs/Self Care Home Management;Aquatic Therapy;Moist Heat;Electrical Stimulation;Traction;Therapeutic activities;Functional mobility training;Stair training;Gait  training;Therapeutic exercise;Balance training;Neuromuscular re-education;Patient/family education;Manual techniques;Dry needling;Passive range of motion;Scar mobilization;Taping    Consulted and Agree with Plan of Care  Patient       Patient will benefit from skilled therapeutic intervention in order to improve the following deficits and impairments:  Abnormal gait, Decreased balance, Increased muscle spasms, Decreased range of motion, Decreased scar mobility, Improper body mechanics, Decreased coordination, Decreased strength, Increased fascial restricitons, Impaired flexibility, Postural dysfunction, Pain  Visit Diagnosis: Abnormal posture  Chronic right shoulder pain  Other muscle spasm     Problem List Patient Active Problem List   Diagnosis Date Noted  . Chronic UTI 12/06/2018  . Status post lumbar surgery 12/06/2018  . Spondylolisthesis, lumbar region 09/26/2018  . Aortic atherosclerosis ([place]) 08/31/2018  . CAD (coronary artery disease) 02/23/2018  . Advanced care planning/counseling discussion 03/28/2017  . Varicose veins of both lower extremities with complications 97/35/3299  . Arthritis 12/09/2016  . Vaginal atrophy 09/10/2015  . Menopausal hot flushes 09/10/2015  . Onychomycosis due to dermatophyte 04/07/2015  . Bursitis of right shoulder 02/26/2015  . Environmental and seasonal allergies 02/26/2015  . Other allergic rhinitis 02/26/2015  . Osteoporosis 11/26/2014  . COPD (chronic obstructive pulmonary disease) ([place]) 11/26/2014  . Fuchs' corneal dystrophy 11/26/2014  . Benign hypertensive renal disease 11/26/2014  . Hyperlipidemia 11/26/2014  . Chronic kidney disease, stage III (moderate) ([place]) 11/26/2014  . Hypothyroidism 11/26/2014  . Endothelial corneal dystrophy 11/26/2014    Jerl Mina ,PT, DPT, E-RYT  07/30/2019, 9:26 AM  [place] MAIN [facility] SERVICES [address] [place], [place], [address] Phone:  [phone]   Fax:  [phone]  Name: Julia Deleon MRN: [id number] Date of Birth: 1945/05/22

## 2019-07-31 ENCOUNTER — Other Ambulatory Visit: Payer: Self-pay

## 2019-07-31 ENCOUNTER — Ambulatory Visit: Payer: Medicare Other | Admitting: Physical Therapy

## 2019-07-31 DIAGNOSIS — M62838 Other muscle spasm: Secondary | ICD-10-CM

## 2019-07-31 DIAGNOSIS — G8929 Other chronic pain: Secondary | ICD-10-CM

## 2019-07-31 DIAGNOSIS — R293 Abnormal posture: Secondary | ICD-10-CM | POA: Diagnosis not present

## 2019-07-31 DIAGNOSIS — M25511 Pain in right shoulder: Secondary | ICD-10-CM | POA: Diagnosis not present

## 2019-07-31 DIAGNOSIS — R2689 Other abnormalities of gait and mobility: Secondary | ICD-10-CM | POA: Diagnosis not present

## 2019-07-31 NOTE — Therapy (Signed)
[place] MAIN [facility] SERVICES [address] [place], [place], [address] Phone: [phone]   Fax:  [phone]  Physical Therapy Treatment  Patient Details  Name: Julia Deleon MRN: [id number] Date of Birth: [birth year] Referring Provider (PT): Dr. Maryan Puls   Encounter Date: 07/31/2019  PT End of Session - 07/31/19 1727    Visit Number  [id number]    Date for PT Re-Evaluation  09/11/19   Progress note to report #74 (last on #64 07/06/19)   Authorization Type  Medicare    PT Start Time  1000    PT Stop Time  1100    PT Time Calculation (min)  60 min    Activity Tolerance  Patient tolerated treatment well;No increased pain    Behavior During Therapy  WFL for tasks assessed/performed       Past Medical History:  Diagnosis Date  . Arthritis   . COPD (chronic obstructive pulmonary disease) ([place])   . Family history of adverse reaction to anesthesia    father was slow to wake up  . Fuch's endothelial dystrophy   . Hyperlipidemia   . Hypothyroidism   . PONV (postoperative nausea and vomiting)    slow to wake up and PONV    Past Surgical History:  Procedure Laterality Date  . APPENDECTOMY    . BACK SURGERY  09/26/2018   L4-5 PLIF by Dr. Arnoldo Morale  . CARDIAC CATHETERIZATION    . CHOLECYSTECTOMY    . EYE SURGERY    . FOOT SURGERY Right    pin removed left  . laser vein surgery    . NASAL SINUS SURGERY    . RIGHT/LEFT HEART CATH AND CORONARY ANGIOGRAPHY N/A 12/06/2017   Procedure: RIGHT/LEFT HEART CATH AND CORONARY ANGIOGRAPHY;  Surgeon: Yolonda Kida, MD;  Location: [facility];  Service: Cardiovascular;  Laterality: N/A;  . TONSILLECTOMY      There were no vitals filed for this visit.  Subjective Assessment - 07/31/19 1007    Subjective  Pt reported she woke up feeling tight every morning. But today she feels more tight than usual, woke with a cramp in the L leg. Pt reported she did too much yesterday. After the gym, she did  not stretch.         [facility] PT Assessment - 07/31/19 1103      Lunges   Comments  less difficulty with balance.       AROM   Overall AROM Comments   70 deg shoulder abduction with back against the wall in wall squat position ( pre Tx) ,  70 deg post Tx       Palpation   Palpation comment  tight intrinsic L feet mm limited in toe abduction with rays                    [facility] Adult PT Treatment/Exercise - 07/31/19 1725      Therapeutic Activites    Other Therapeutic Activities  education on the importance of stretching after gym activities.  Guided stretches in seated position and standing lunges       Neuro Re-ed    Neuro Re-ed Details   cued for lunge position with isometric on R to lengthen R convex lumbar, more hip abd/ ER on front leg in lunge to minimize genu valgus on L, cued for more eccentric control of feet in gait  and toe abduction      Manual Therapy  Manual therapy comments  STM/MWM to promote toe abduction and between rays                PT Short Term Goals - 06/08/19 1133      PT SHORT TERM GOAL #1   Title  Patient will demonstrate improved pelvic alignment and balance of musculature surrounding the pelvis to facilitate decreased PFM spasms and decrease pelvic pain.    Baseline  Pt. demonstrates severe posterior pelvic tilt, L thoracic/R lumbar scoliosis, and R anterior/L posterior pelvic obliquity. Pt. has demonstrated improved pelvic alignment and posture but has not been able to sustain long-term due to continued lack of postural strength.    Time  5    Period  Weeks    Status  Achieved    Target Date  03/06/19      PT SHORT TERM GOAL #2   Title  Patient will demonstrate a coordinated contraction, relaxation, and bulge of the pelvic floor muscles to demonstrate functional recruitment and motion and allow for bladder emptying.    Baseline  Pt. is unable to fully empty bladder and describes pain with catheterization, demonstrating high tone  of PFM.    Time  5    Period  Weeks    Status  Achieved    Target Date  03/06/19      PT SHORT TERM GOAL #3   Title  Patient will demonstrate improved sitting and standing posture to demonstrate learning and decrease stress on the pelvic floor with functional activity.    Baseline  Pt. demonstrates severe posterior pelvic tilt, L thoracic/R lumbar scoliosis, and R anterior/L posterior pelvic obliquity. 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 Will be able to cath without pain and will be cleared by physiscian to d/c self-cath due to decreased residual volume with bladder emptying.    Baseline  required to cath 3 times per day, painful each time.    Time  10    Period  Weeks    Status  Achieved      PT LONG TERM GOAL #4   Title  Patient will score less than or  equal to 40% on the Female NIH-CPSI and 30% on the [facility] to demonstrate a reduction in pain, urinary symptoms, and an improved quality of life.    Baseline  Female NIH-CPSI: 38/43 (88%) , PDI: 45/75 (60%); PDI 44/75 (59%) and Female  NIH-CPSI: 27/43 (64%) on 02-08-19 , Female NIH-CPSI: 17% on 03-26-19    Time  10    Period  Weeks    Status  Achieved      PT LONG TERM GOAL #5   Title  Patient will demonstrate increased step length with reciprical arm swing, and demonstrate no scissoring steps.    Baseline  Pt currently demonstrated scissoring steppage on R LE, increased trunk rotation and deep core engagement with reciprical walking poles.    Time  10    Period  Weeks    Status  Achieved      Additional Long Term Goals   Additional Long Term Goals  --      PT LONG TERM GOAL #6   Title  Pt will increase her distance on the 6MWT from 747 feet  to  > 900 feet  order to progress with aerobic exercises and walking longer distances in the community    Time  8    Period  Weeks    Status  Revised      PT LONG TERM GOAL #7   Title  Pt will report waking up > 2 mornings in a row with pain level < 5-6/10 in order to demo maintanence of Tx benefits and more balance of mm tensions achieved with HEP, shoe lift to improve QOL    Baseline  7-8/10 level and benefits from Tx lasted only one morning  (03-19-19 and 03-23-19)    Time  10    Period  Weeks    Status  Partially Met      PT LONG TERM GOAL #8   Title  Pt will be able to demo increased PF strength with unilateral UE support on wall from B 3/5 15 reps in order to achieve stronger push off in gait and walk longer distance    Baseline  B, L 7 reps  2/5, 10 reps R 3/5  ( 12/17: 15 reps R , 13 reps L, 06/01/19: R 20 reps, L 18 reps  )    Time  10    Period  Weeks    Status  Achieved      PT LONG TERM GOAL  #9   TITLE  Pt will demo shoulder abduction > 120 deg B with improved alignment of thorax over pelvis without manual cues in order to demo proper co-activation of B equal weight bearing in BLE, and decreased thoracic kyhposis/ decreased thoracic rotation 2/2 scoliosis in order to lift plates overhead cabinets/ decreased risk for    Time  10    Period  Weeks    Status   Achieved      PT LONG TERM GOAL  #10   TITLE  Pt will demo increased height from 63" to > 63 1/2 " and maintain 63 1/2" across 3 weeks ( once a week measurement) and forward head posture improve ( decreased distance from wall to earlobe 17 cm from earlobe to wall to > 12 cm)  in order to be ready to progress to PNF exercise with less shoulder pain and lengthen/ strengthen concave curve on R 2/2 scoliosis to minimize L lateral thigh tightness  (07/06/19 : 63 3/4" )    Time  8    Period  Weeks    Status  Partially Met      PT LONG TERM GOAL  #11   TITLE  Pt will demo single UE support with lunge position exercises/ yoga poses  in order to demo improved balance and IND with flexibility routine    Baseline  BUE support    Time  10    Period  Weeks    Status  New      PT LONG TERM GOAL  #12   TITLE  Pt will demo L trunk rotation in R sidelying with zero report of pain in order to progress with longer walking endurance    Time  10    Period  Weeks    Status  New    Target Date  10/01/19      PT LONG TERM GOAL  #13   TITLE  Pt will demo increased  shoulder abd R against wall in mini squat from 55 deg to > 75 deg in order to demo increased thoracic extension and intercostal lengthening for functional BUE mobility without compensations    Time  8    Period  Weeks    Status  New    Target Date  09/17/19            Plan - 07/31/19 1727    Clinical Impression Statement  Pt demo'd increased shoulder abduction with thoracic extension against wall between last  week's session to post Tx today from 55 deg to 70 deg on R LE. Addressing posterior ribs 2/2 to scoliosis ( rotational component last week ) and increasing instrinsic L foot mobility ( today) helped pt to reinforce  L knee ext, more lower kinetic chain co-activation to stand taller and increase thoracic extension. Pt required reiteration of the importance of stretching after her gym workout to minimize tightness in her legs. Guided pt  through seated stretches. Plan to incorporate more seated yoga postures, instrinsic feet strengthening for better eccentric control of plantar arches. Provided gait training.  Pt continues to benefit from skilled PT to advance toward walking with less pain.    Personal Factors and Comorbidities  Age;Comorbidity 3+    Comorbidities  Osteoporosis, scoliosis, Arthritis, COPD, recent lumbosacral fusion for spondylolisthesis.    Examination-Activity Limitations  Toileting;Sit;Stand;Bend;Lift;Carry    Examination-Participation Restrictions  Interpersonal Relationship;Yard Work;Cleaning;Laundry    Stability/Clinical Decision Making  Unstable/Unpredictable    Rehab Potential  Good    PT Frequency  2x / week    PT Duration  Other (comment)   20   PT Treatment/Interventions  ADLs/Self Care Home Management;Aquatic Therapy;Moist Heat;Electrical Stimulation;Traction;Therapeutic activities;Functional mobility training;Stair training;Gait training;Therapeutic exercise;Balance training;Neuromuscular re-education;Patient/family education;Manual techniques;Dry needling;Passive range of motion;Scar mobilization;Taping    Consulted and Agree with Plan of Care  Patient       Patient will benefit from skilled therapeutic intervention in order to improve the following deficits and impairments:  Abnormal gait, Decreased balance, Increased muscle spasms, Decreased range of motion, Decreased scar mobility, Improper body mechanics, Decreased coordination, Decreased strength, Increased fascial restricitons, Impaired flexibility, Postural dysfunction, Pain  Visit Diagnosis: Abnormal posture  Chronic right shoulder pain  Other muscle spasm     Problem List Patient Active Problem List   Diagnosis Date Noted  . Chronic UTI 12/06/2018  . Status post lumbar surgery 12/06/2018  . Spondylolisthesis, lumbar region 09/26/2018  . Aortic atherosclerosis ([place]) 08/31/2018  . CAD (coronary artery disease) 02/23/2018  .  Advanced care planning/counseling discussion 03/28/2017  .  Varicose veins of both lower extremities with complications 00/16/4290  . Arthritis 12/09/2016  . Vaginal atrophy 09/10/2015  . Menopausal hot flushes 09/10/2015  . Onychomycosis due to dermatophyte 04/07/2015  . Bursitis of right shoulder 02/26/2015  . Environmental and seasonal allergies 02/26/2015  . Other allergic rhinitis 02/26/2015  . Osteoporosis 11/26/2014  . COPD (chronic obstructive pulmonary disease) ([place]) 11/26/2014  . Fuchs' corneal dystrophy 11/26/2014  . Benign hypertensive renal disease 11/26/2014  . Hyperlipidemia 11/26/2014  . Chronic kidney disease, stage III (moderate) ([place]) 11/26/2014  . Hypothyroidism 11/26/2014  . Endothelial corneal dystrophy 11/26/2014    Jerl Mina ,PT, DPT, E-RYT  07/31/2019, 5:33 PM  [place] MAIN [facility] SERVICES [address] [place], [place], [address] Phone: [phone]   Fax:  [phone]  Name: Julia Deleon MRN: [id number] Date of Birth: [birth year]

## 2019-07-31 NOTE — Patient Instructions (Signed)
Make time to stretch 10 mins and 5 min for legs elevated after gym  Every time    Please dont skip this   ____  Going to bed:  wiggle toes and toes spread   Upon walking,do same Sitting at end of the bed, feet on the floor Lift toes, spread 20 reps    Upon standing, do same    Practice  Lift knees, lower [place] ( not thumping the flat foot)  Pink panther walking with toes spread

## 2019-08-02 ENCOUNTER — Ambulatory Visit: Payer: Medicare Other | Admitting: Physical Therapy

## 2019-08-02 ENCOUNTER — Other Ambulatory Visit: Payer: Self-pay

## 2019-08-02 DIAGNOSIS — G8929 Other chronic pain: Secondary | ICD-10-CM | POA: Diagnosis not present

## 2019-08-02 DIAGNOSIS — R293 Abnormal posture: Secondary | ICD-10-CM

## 2019-08-02 DIAGNOSIS — M25511 Pain in right shoulder: Secondary | ICD-10-CM | POA: Diagnosis not present

## 2019-08-02 DIAGNOSIS — M62838 Other muscle spasm: Secondary | ICD-10-CM

## 2019-08-02 DIAGNOSIS — R2689 Other abnormalities of gait and mobility: Secondary | ICD-10-CM | POA: Diagnosis not present

## 2019-08-02 NOTE — Therapy (Signed)
[place] MAIN [facility] SERVICES [address] [place], [place], [address] Phone: [phone]   Fax:  [phone]  Physical Therapy Treatment  Patient Details  Name: Julia Deleon MRN: [id number] Date of Birth: Jan 19, 1946 Referring Provider (PT): Dr. Maryan Puls   Encounter Date: 08/02/2019  PT End of Session - 08/02/19 1637    Visit Number  [id number]    Date for PT Re-Evaluation  09/11/19   Progress note to report #74 (last on #64 07/06/19)   Authorization Type  Medicare    PT Start Time  5537    PT Stop Time  1555    PT Time Calculation (min)  50 min    Activity Tolerance  Patient tolerated treatment well;No increased pain    Behavior During Therapy  WFL for tasks assessed/performed       Past Medical History:  Diagnosis Date  . Arthritis   . COPD (chronic obstructive pulmonary disease) ([place])   . Family history of adverse reaction to anesthesia    father was slow to wake up  . Fuch's endothelial dystrophy   . Hyperlipidemia   . Hypothyroidism   . PONV (postoperative nausea and vomiting)    slow to wake up and PONV    Past Surgical History:  Procedure Laterality Date  . APPENDECTOMY    . BACK SURGERY  09/26/2018   L4-5 PLIF by Dr. Arnoldo Morale  . CARDIAC CATHETERIZATION    . CHOLECYSTECTOMY    . EYE SURGERY    . FOOT SURGERY Right    pin removed left  . laser vein surgery    . NASAL SINUS SURGERY    . RIGHT/LEFT HEART CATH AND CORONARY ANGIOGRAPHY N/A 12/06/2017   Procedure: RIGHT/LEFT HEART CATH AND CORONARY ANGIOGRAPHY;  Surgeon: Yolonda Kida, MD;  Location: [place] [facility];  Service: Cardiovascular;  Laterality: N/A;  . TONSILLECTOMY      There were no vitals filed for this visit.  Subjective Assessment - 08/02/19 1513    Subjective  Pt had no increased pain with last session's new exercises.         [facility] PT Assessment - 08/02/19 1525      Palpation   SI assessment   tightness glut med at iliac crest L,  interspinals L       Ambulation/Gait   Gait Comments  R sideflexion at lumbar spine with stance phase,  L sideflexion on L stance phase                 Pelvic Floor Special Questions - 08/02/19 1514    External Palpation  5 sec, 3 reps  before fatigue ( accessory use of ab mm)         [facility] Adult PT Treatment/Exercise - 08/02/19 1525      Therapeutic Activites    Other Therapeutic Activities  body scan in seated position, mirror therapy       Neuro Re-ed    Neuro Re-ed Details   cued for car stretches , mirror therapy 5 min       Manual Therapy   Manual therapy comments  Grade II mob / STM at L interspinals/ glut med L                 PT Short Term Goals - 06/08/19 1133      PT SHORT TERM GOAL #1   Title  Patient will demonstrate improved pelvic alignment and balance of musculature surrounding the pelvis  to facilitate decreased PFM spasms and decrease pelvic pain.    Baseline  Pt. demonstrates severe posterior pelvic tilt, L thoracic/R lumbar scoliosis, and R anterior/L posterior pelvic obliquity. Pt. has demonstrated improved pelvic alignment and posture but has not been able to sustain long-term due to continued lack of postural strength.    Time  5    Period  Weeks    Status  Achieved    Target Date  03/06/19      PT SHORT TERM GOAL #2   Title  Patient will demonstrate a coordinated contraction, relaxation, and bulge of the pelvic floor muscles to demonstrate functional recruitment and motion and allow for bladder emptying.    Baseline  Pt. is unable to fully empty bladder and describes pain with catheterization, demonstrating high tone of PFM.    Time  5    Period  Weeks    Status  Achieved    Target Date  03/06/19      PT SHORT TERM GOAL #3   Title  Patient will demonstrate improved sitting and standing posture to demonstrate learning and decrease stress on the pelvic floor with functional activity.    Baseline  Pt. demonstrates severe posterior  pelvic tilt, L thoracic/R lumbar scoliosis, and R anterior/L posterior pelvic obliquity. 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 Will be able to cath without pain and will be cleared by physiscian to d/c self-cath due to decreased residual volume with bladder emptying.    Baseline  required to cath 3 times per day, painful each time.    Time  10    Period  Weeks    Status  Achieved      PT LONG TERM GOAL #4   Title  Patient will score less than or equal to 40% on the Female NIH-CPSI and 30% on the [facility] to demonstrate a reduction in pain, urinary symptoms, and an improved quality of life.    Baseline  Female NIH-CPSI: 38/43 (88%) , PDI: 45/75 (60%); PDI 44/75 (59%) and Female NIH-CPSI: 27/43 (64%) on 02-08-19 , Female NIH-CPSI: 17% on 03-26-19    Time  10  Period  Weeks    Status  Achieved      PT LONG TERM GOAL #5   Title  Patient will demonstrate increased step length with reciprical arm swing, and demonstrate no scissoring steps.    Baseline  Pt currently demonstrated scissoring steppage  on R LE, increased trunk rotation and deep core engagement with reciprical walking poles.    Time  10    Period  Weeks    Status  Achieved      Additional Long Term Goals   Additional Long Term Goals  --      PT LONG TERM GOAL #6   Title  Pt will increase her distance on the 6MWT from 747 feet  to  > 900 feet  order to progress with aerobic exercises and walking longer distances in the community    Time  8    Period  Weeks    Status  Revised      PT LONG TERM GOAL #7   Title  Pt will report waking up > 2 mornings in a row with pain level < 5-6/10 in order to demo maintanence of Tx benefits and more balance of mm tensions achieved with HEP, shoe lift to improve QOL    Baseline  7-8/10 level and benefits from Tx lasted only one morning  (03-19-19 and 03-23-19)    Time  10    Period  Weeks    Status  Partially Met      PT LONG TERM GOAL #8   Title  Pt will be able to demo increased PF strength with unilateral UE support on wall from B 3/5 15 reps in order to achieve stronger push off in gait and walk longer distance    Baseline  B, L 7 reps  2/5, 10 reps R 3/5  ( 12/17: 15 reps R , 13 reps L, 06/01/19: R 20 reps, L 18 reps  )    Time  10    Period  Weeks    Status  Achieved      PT LONG TERM GOAL  #9   TITLE  Pt will demo shoulder abduction > 120 deg B with improved alignment of thorax over pelvis without manual cues in order to demo proper co-activation of B equal weight bearing in BLE, and decreased thoracic kyhposis/ decreased thoracic rotation 2/2 scoliosis in order to lift plates overhead cabinets/ decreased risk for    Time  10    Period  Weeks    Status  Achieved      PT LONG TERM GOAL  #10   TITLE  Pt will demo increased height from 63" to > 63 1/2 " and maintain 63 1/2" across 3 weeks ( once a week measurement) and forward head posture improve ( decreased distance from wall to earlobe 17 cm from earlobe to wall to > 12 cm)  in order to be ready to progress to PNF exercise with  less shoulder pain and lengthen/ strengthen concave curve on R 2/2 scoliosis to minimize L lateral thigh tightness  (07/06/19 : 63 3/4" )    Time  8    Period  Weeks    Status  Partially Met      PT LONG TERM GOAL  #11   TITLE  Pt will demo single UE support with lunge position exercises/ yoga poses  in order to demo improved balance and IND with flexibility routine    Baseline  BUE support  Time  10    Period  Weeks    Status  New      PT LONG TERM GOAL  #12   TITLE  Pt will demo L trunk rotation in R sidelying with zero report of pain in order to progress with longer walking endurance    Time  10    Period  Weeks    Status  New    Target Date  10/01/19      PT LONG TERM GOAL  #13   TITLE  Pt will demo increased  shoulder abd R against wall in mini squat from 55 deg to > 75 deg in order to demo increased thoracic extension and intercostal lengthening for functional BUE mobility without compensations    Time  8    Period  Weeks    Status  New    Target Date  09/17/19            Plan - 08/02/19 1638    Clinical Impression Statement  Pt demo'd improved thoracic extension and self-correction with upright posture. Pt demo'd proper pelvic floor contractions / coordination 5 sec, 3 reps, Grade 3/5 today with progression to endurance training.   Provided pain science  with mirror therapy and seated body scan techniques. Manual Tx was provided to minimize minor tightness along L4-5 and glut med by iliac crest L . Provided cues for better eccentric control with sit to stand. Pt continues to show slight upper lumbar R sideflexion and L sideflexion at lower lumbar in gait and plan to continue to addressing this area at next session. Plan to incorporate yoga posture/ stretching routine to compliment strengthening. Pt continues to benefit from skilled PT.    Personal Factors and Comorbidities  Age;Comorbidity 3+    Comorbidities  Osteoporosis, scoliosis, Arthritis, COPD, recent lumbosacral  fusion for spondylolisthesis.    Examination-Activity Limitations  Toileting;Sit;Stand;Bend;Lift;Carry    Examination-Participation Restrictions  Interpersonal Relationship;Yard Work;Cleaning;Laundry    Stability/Clinical Decision Making  Unstable/Unpredictable    Rehab Potential  Good    PT Frequency  2x / week    PT Duration  Other (comment)   20   PT Treatment/Interventions  ADLs/Self Care Home Management;Aquatic Therapy;Moist Heat;Electrical Stimulation;Traction;Therapeutic activities;Functional mobility training;Stair training;Gait training;Therapeutic exercise;Balance training;Neuromuscular re-education;Patient/family education;Manual techniques;Dry needling;Passive range of motion;Scar mobilization;Taping    Consulted and Agree with Plan of Care  Patient       Patient will benefit from skilled therapeutic intervention in order to improve the following deficits and impairments:  Abnormal gait, Decreased balance, Increased muscle spasms, Decreased range of motion, Decreased scar mobility, Improper body mechanics, Decreased coordination, Decreased strength, Increased fascial restricitons, Impaired flexibility, Postural dysfunction, Pain  Visit Diagnosis: Abnormal posture  Chronic right shoulder pain  Other muscle spasm     Problem List Patient Active Problem List   Diagnosis Date Noted  . Chronic UTI 12/06/2018  . Status post lumbar surgery 12/06/2018  . Spondylolisthesis, lumbar region 09/26/2018  . Aortic atherosclerosis ([place]) 08/31/2018  . CAD (coronary artery disease) 02/23/2018  . Advanced care planning/counseling discussion 03/28/2017  . Varicose veins of both lower extremities with complications 95/01/3266  . Arthritis 12/09/2016  . Vaginal atrophy 09/10/2015  . Menopausal hot flushes 09/10/2015  . Onychomycosis due to dermatophyte 04/07/2015  . Bursitis of right shoulder 02/26/2015  . Environmental and seasonal allergies 02/26/2015  . Other allergic rhinitis  02/26/2015  . Osteoporosis 11/26/2014  . COPD (chronic obstructive pulmonary disease) ([place]) 11/26/2014  . Fuchs' corneal dystrophy 11/26/2014  .  Benign hypertensive renal disease 11/26/2014  . Hyperlipidemia 11/26/2014  . Chronic kidney disease, stage III (moderate) ([place]) 11/26/2014  . Hypothyroidism 11/26/2014  . Endothelial corneal dystrophy 11/26/2014    Jerl Mina 08/02/2019, 5:00 PM  [place] MAIN [facility] SERVICES [place], [place], [address] Phone: [phone]   Fax:  [phone]  Name: Julia Deleon MRN: [id number] Date of Birth: 13-Aug-1945

## 2019-08-02 NOTE — Patient Instructions (Addendum)
Car stretches:   Lunges to stretch the hip crease  Figure 4    Hands on door ,   Palms shoulder width apart  Minisquat postion Trunk is parallel to floor  A) Pull buttocks back to lengthen spine, knees bent  3 breaths   B) Bring R hand to the L, and stretch the R side trunk  3 breaths   Brings hands to center again Do the same to the L side stretch by placing L hand on top of R     Do the same to other side   ___  Elbow circles going backwards   ___________________________________________  PELVIC FLOOR / KEGEL EXERCISES   Pelvic floor/ Kegel exercises are used to strengthen the muscles in the base of your pelvis that are responsible for supporting your pelvic organs and preventing urine/feces leakage. Based on your therapist's recommendations, they can be performed while standing, sitting, or lying down. Imagine pelvic floor area as a diamond with pelvic landmarks: top =pubic bone, bottom tip=tailbone, sides=sitting bones (ischial tuberosities).    Make yourself aware of this muscle group by using these cues while coordinating your breath:  Inhale, feel pelvic floor diamond area lower like hammock towards your feet and ribcage/belly expanding. Pause. Let the exhale naturally and feel your belly sink, abdominal muscles hugging in around you and you may notice the pelvic diamond draws upward towards your head forming a umbrella shape. Give a squeeze during the exhalation like you are stopping the flow of urine. If you are squeezing the buttock muscles, try to give 50% less effort.   Common Errors:  Breath holding: If you are holding your breath, you may be bearing down against your bladder instead of pulling it up. If you belly bulges up while you are squeezing, you are holding your breath. Be sure to breathe gently in and out while exercising. Counting out loud may help you avoid holding your breath.  Accessory muscle use: You should not see or feel other muscle  movement when performing pelvic floor exercises. When done properly, no one can tell that you are performing the exercises. Keep the buttocks, belly and inner thighs relaxed.  Overdoing it: Your muscles can fatigue and stop working for you if you over-exercise. You may actually leak more or feel soreness at the lower abdomen or rectum.  YOUR HOME EXERCISE PROGRAM  LONG HOLDS:   Position: on back or reclined in car seat ( do not lift head to sit up, instead make sure to use the handle to raise the car seat up and keep spine/head relaxed to not place load on pelvic floor/ abdominal muscle)     Inhale and then exhale. Then squeeze the muscle and count aloud for 5 seconds. Rest with three long breaths. (Be sure to let belly sink in with exhales and not push outward)  Perform 3 repetitions, 3 times/day                     DECREASE DOWNWARD PRESSURE ON  YOUR PELVIC FLOOR, ABDOMINAL, LOW BACK MUSCLES       PRESERVE YOUR PELVIC HEALTH LONG-TERM   ** SQUEEZE pelvic floor BEFORE YOUR SNEEZE, COUGH, LAUGH   ** EXHALE BEFORE YOU RISE AGAINST GRAVITY (lifting, sit to stand, from squat to stand)   ** LOG ROLL OUT OF BED INSTEAD OF CRUNCH/SIT-UP

## 2019-08-03 ENCOUNTER — Ambulatory Visit: Payer: Medicare Other | Admitting: Physical Therapy

## 2019-08-03 ENCOUNTER — Encounter: Payer: Medicare Other | Admitting: Physical Therapy

## 2019-08-07 ENCOUNTER — Ambulatory Visit: Payer: Medicare Other | Admitting: Physical Therapy

## 2019-08-07 ENCOUNTER — Other Ambulatory Visit: Payer: Self-pay

## 2019-08-07 DIAGNOSIS — M25511 Pain in right shoulder: Secondary | ICD-10-CM | POA: Diagnosis not present

## 2019-08-07 DIAGNOSIS — R293 Abnormal posture: Secondary | ICD-10-CM

## 2019-08-07 DIAGNOSIS — M62838 Other muscle spasm: Secondary | ICD-10-CM | POA: Diagnosis not present

## 2019-08-07 DIAGNOSIS — G8929 Other chronic pain: Secondary | ICD-10-CM | POA: Diagnosis not present

## 2019-08-07 DIAGNOSIS — R2689 Other abnormalities of gait and mobility: Secondary | ICD-10-CM | POA: Diagnosis not present

## 2019-08-07 NOTE — Patient Instructions (Signed)
Practice proper pelvic floor coordination  Inhale: expand pelvic floor muscles Exhale" "j" scoop, allow pelvic floor to close, lift first before belly sinks   ( not "draw abdominal muscle to spine" or strain with abdominal muscles")

## 2019-08-07 NOTE — Therapy (Signed)
[place] MAIN [facility] SERVICES [address] [place], [place], [address] Phone: [phone]   Fax:  [phone]  Physical Therapy Treatment  Patient Details  Name: Julia Deleon MRN: [id number] Date of Birth: 1946-02-10 Referring Provider (PT): Dr. Maryan Puls   Encounter Date: 08/07/2019  PT End of Session - 08/07/19 1012    Visit Number  31    Date for PT Re-Evaluation  09/11/19   Progress note to report #74 (last on #64 07/06/19)   Authorization Type  Medicare    PT Start Time  1003    PT Stop Time  1100    PT Time Calculation (min)  57 min    Activity Tolerance  Patient tolerated treatment well;No increased pain    Behavior During Therapy  WFL for tasks assessed/performed       Past Medical History:  Diagnosis Date  . Arthritis   . COPD (chronic obstructive pulmonary disease) ([place])   . Family history of adverse reaction to anesthesia    father was slow to wake up  . Fuch's endothelial dystrophy   . Hyperlipidemia   . Hypothyroidism   . PONV (postoperative nausea and vomiting)    slow to wake up and PONV    Past Surgical History:  Procedure Laterality Date  . APPENDECTOMY    . BACK SURGERY  09/26/2018   L4-5 PLIF by Dr. Arnoldo Morale  . CARDIAC CATHETERIZATION    . CHOLECYSTECTOMY    . EYE SURGERY    . FOOT SURGERY Right    pin removed left  . laser vein surgery    . NASAL SINUS SURGERY    . RIGHT/LEFT HEART CATH AND CORONARY ANGIOGRAPHY N/A 12/06/2017   Procedure: RIGHT/LEFT HEART CATH AND CORONARY ANGIOGRAPHY;  Surgeon: Yolonda Kida, MD;  Location: [place] [facility];  Service: Cardiovascular;  Laterality: N/A;  . TONSILLECTOMY      There were no vitals filed for this visit.  Subjective Assessment - 08/07/19 1017    Subjective  Pt drove close to 2 hrs and she felt her L leg tight but it did not stop her from her activities. Pt shared the story of her first L & D which was traumatic for her.  She had back labor  contractions and did not  have contractions in the front.  It caused injury to tailbone with use of forceps and pt had to sit on the pillow for month. Pt's urinary system also shut down for 3 days. Pt tried to block that memory. Pt had to lie flat in the back of her stationwagon because it was painful to sit.  The L & D went fine with her 2nd baby.         [facility] PT Assessment - 08/07/19 1016      AROM   Overall AROM Comments  88 deg  Rshoulder abduction with back against the wall in wall squat position , 95 deg L                 Pelvic Floor Special Questions - 08/07/19 1054    Pelvic Floor Internal Exam  pt consented without contraindications     Exam Type  Vaginal    Palpation  increased tightness at 7-5 o clock, ischiococcygeus B, L obt int/ piriformis    L radiating pain to ankle shortly after internal Tx   Strength  fair squeeze, definite lift   initially decreased contraction at R posterior mm  Strength # of reps  10    Strength # of seconds  1        [facility] Adult PT Treatment/Exercise - 08/07/19 1057      Therapeutic Activites    Other Therapeutic Activities  reassessed shoulder abduction by wall, discussed pelvic floor physiology as postural stability mm       Neuro Re-ed    Neuro Re-ed Details   cued for less ab overuse with pelvic floor coordination       Moist Heat Therapy   Number Minutes Moist Heat  5 Minutes    Moist Heat Location  --   perineum between layers/ sheets     Manual Therapy   Internal Pelvic Floor  fascial mobilization with guided cues for diaphragmatic / pelvic excursion                PT Short Term Goals - 06/08/19 1133      PT SHORT TERM GOAL #1   Title  Patient will demonstrate improved pelvic alignment and balance of musculature surrounding the pelvis to facilitate decreased PFM spasms and decrease pelvic pain.    Baseline  Pt. demonstrates severe posterior pelvic tilt, L thoracic/R lumbar scoliosis, and R anterior/L  posterior pelvic obliquity. Pt. has demonstrated improved pelvic alignment and posture but has not been able to sustain long-term due to continued lack of postural strength.    Time  5    Period  Weeks    Status  Achieved    Target Date  03/06/19      PT SHORT TERM GOAL #2   Title  Patient will demonstrate a coordinated contraction, relaxation, and bulge of the pelvic floor muscles to demonstrate functional recruitment and motion and allow for bladder emptying.    Baseline  Pt. is unable to fully empty bladder and describes pain with catheterization, demonstrating high tone of PFM.    Time  5    Period  Weeks    Status  Achieved    Target Date  03/06/19      PT SHORT TERM GOAL #3   Title  Patient will demonstrate improved sitting and standing posture to demonstrate learning and decrease stress on the pelvic floor with functional activity.    Baseline  Pt. demonstrates severe posterior pelvic tilt, L thoracic/R lumbar scoliosis, and R anterior/L posterior pelvic obliquity. 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 Will be able to cath without pain and will be cleared by physiscian to d/c self-cath due to decreased residual volume with bladder emptying.    Baseline  required to cath 3 times per day, painful each time.    Time  10    Period  Weeks    Status  Achieved      PT LONG TERM GOAL #4   Title  Patient will score less than or equal to 40% on the Female NIH-CPSI and 30% on the [facility] to demonstrate a reduction in pain, urinary symptoms, and an improved quality of life.    Baseline  Female NIH-CPSI: 38/43 (88%) , PDI: 45/75 (60%); PDI 44/75 (59%) and Female NIH-CPSI: 27/43 (64%) on 02-08-19 , Female NIH-CPSI: 17% on 03-26-19    Time  10    Period  Weeks    Status  Achieved      PT LONG TERM GOAL #5   Title  Patient will demonstrate increased step length with reciprical arm swing, and demonstrate no scissoring steps.    Baseline  Pt currently demonstrated scissoring steppage on R LE, increased trunk rotation and deep core engagement with reciprical walking poles.    Time  10    Period  Weeks    Status  Achieved      Additional Long Term Goals   Additional Long Term Goals  --      PT LONG TERM GOAL #6   Title  Pt will increase her distance on the 6MWT from 747 feet  to  > 900 feet  order to progress with aerobic exercises and walking longer distances in the community    Time  8    Period  Weeks    Status  Revised      PT LONG TERM GOAL #7   Title  Pt will report waking up > 2 mornings in a row with pain level < 5-6/10 in order to demo maintanence of Tx benefits and more balance of mm tensions achieved with HEP, shoe lift to improve QOL    Baseline  7-8/10 level and benefits from Tx lasted only one morning  (03-19-19 and 03-23-19)    Time  10    Period  Weeks    Status  Partially Met      PT LONG TERM GOAL #8   Title  Pt will be able to demo increased PF strength with unilateral UE support on wall from B 3/5 15 reps in order to achieve stronger push off in gait and walk  longer distance    Baseline  B, L 7 reps  2/5, 10 reps R 3/5  ( 12/17: 15 reps R , 13 reps L, 06/01/19: R 20 reps, L 18 reps  )    Time  10    Period  Weeks    Status  Achieved      PT LONG TERM GOAL  #9   TITLE  Pt will demo shoulder abduction > 120 deg B with improved alignment of thorax over pelvis without manual cues in order to demo proper co-activation of B equal weight bearing in BLE, and decreased thoracic kyhposis/ decreased thoracic rotation 2/2 scoliosis in order to lift plates overhead  cabinets/ decreased risk for    Time  10    Period  Weeks    Status  Achieved      PT LONG TERM GOAL  #10   TITLE  Pt will demo increased height from 63" to > 63 1/2 " and maintain 63 1/2" across 3 weeks ( once a week measurement) and forward head posture improve ( decreased distance from wall to earlobe 17 cm from earlobe to wall to > 12 cm)  in order to be ready to progress to PNF exercise with less shoulder pain and lengthen/ strengthen concave curve on R 2/2 scoliosis to minimize L lateral thigh tightness  (07/06/19 : 63 3/4" )    Time  8    Period  Weeks    Status  Partially Met      PT LONG TERM GOAL  #11   TITLE  Pt will demo single UE support with lunge position exercises/ yoga poses  in order to demo improved balance and IND with flexibility routine    Baseline  BUE support    Time  10    Period  Weeks    Status  New      PT LONG TERM GOAL  #12   TITLE  Pt will demo L trunk rotation in R sidelying with zero report of pain in order to progress with longer walking endurance    Time  10    Period  Weeks    Status  New    Target Date  10/01/19      PT LONG TERM GOAL  #13   TITLE  Pt will demo increased  shoulder abd R against wall in mini squat from 55 deg to > 75 deg in order to demo increased thoracic extension and intercostal lengthening for functional BUE mobility without compensations    Time  8    Period  Weeks    Status  New    Target Date  09/17/19            Plan -  08/07/19 1059    Clinical Impression Statement   Pt demo'd increased R shoulder abduction against the wall with 55 deg (las month) to 88 deg today which indicate improved spinal extension strength and less rotational deviations 2/2 scoliosis.   Focused on pelvic floor today with reassessment of mm tightness which decreased post Tx. Pt achieved more pelvic mm/ fascial mobility but required cues for less abdominal mm overuse. Although pt showed improved quick contractions, plan to have pt withhold from contraction training and to only focus on coordinated movement of pelvic floor with deep core mm due to pt's prior presentations of overall mm tightness. Plan to continue to add biopsychosocial approaches with pain science retraining as pt has had birth trauma with her 1st child  which involved shut down of her urinary system and injury to tailbone. Pt also had shut down of urinary system after her pelvic surgery last year. Anticipate pt will continue making progress with pelvic floor training to help with gait training.   Pt also changed urologist from Dr. Boneta Lucks to Dr. Erlene Quan. Plan to send future notes to both.   Pt continues to benefits from skilled PT.      Personal Factors and Comorbidities  Age;Comorbidity 3+    Comorbidities  Osteoporosis, scoliosis, Arthritis, COPD, recent lumbosacral fusion for spondylolisthesis.    Examination-Activity Limitations  Toileting;Sit;Stand;Bend;Lift;Carry    Examination-Participation Restrictions  Interpersonal Relationship;Yard Work;Cleaning;Laundry    Stability/Clinical Decision  Making  Unstable/Unpredictable    Rehab Potential  Good    PT Frequency  2x / week    PT Duration  Other (comment)   20   PT Treatment/Interventions  ADLs/Self Care Home Management;Aquatic Therapy;Moist Heat;Electrical Stimulation;Traction;Therapeutic activities;Functional mobility training;Stair training;Gait training;Therapeutic exercise;Balance training;Neuromuscular  re-education;Patient/family education;Manual techniques;Dry needling;Passive range of motion;Scar mobilization;Taping    Consulted and Agree with Plan of Care  Patient       Patient will benefit from skilled therapeutic intervention in order to improve the following deficits and impairments:  Abnormal gait, Decreased balance, Increased muscle spasms, Decreased range of motion, Decreased scar mobility, Improper body mechanics, Decreased coordination, Decreased strength, Increased fascial restricitons, Impaired flexibility, Postural dysfunction, Pain  Visit Diagnosis: Abnormal posture  Chronic right shoulder pain  Other muscle spasm     Problem List Patient Active Problem List   Diagnosis Date Noted  . Chronic UTI 12/06/2018  . Status post lumbar surgery 12/06/2018  . Spondylolisthesis, lumbar region 09/26/2018  . Aortic atherosclerosis ([place]) 08/31/2018  . CAD (coronary artery disease) 02/23/2018  . Advanced care planning/counseling discussion 03/28/2017  . Varicose veins of both lower extremities with complications 60/67/7034  . Arthritis 12/09/2016  . Vaginal atrophy 09/10/2015  . Menopausal hot flushes 09/10/2015  . Onychomycosis due to dermatophyte 04/07/2015  . Bursitis of right shoulder 02/26/2015  . Environmental and seasonal allergies 02/26/2015  . Other allergic rhinitis 02/26/2015  . Osteoporosis 11/26/2014  . COPD (chronic obstructive pulmonary disease) ([place]) 11/26/2014  . Fuchs' corneal dystrophy 11/26/2014  . Benign hypertensive renal disease 11/26/2014  . Hyperlipidemia 11/26/2014  . Chronic kidney disease, stage III (moderate) ([place]) 11/26/2014  . Hypothyroidism 11/26/2014  . Endothelial corneal dystrophy 11/26/2014    Jerl Mina ,PT, DPT, E-RYT  08/07/2019, 11:00 AM  [place] MAIN [facility] SERVICES [address] [place], [place], [address] Phone: [phone]   Fax:  [phone]  Name: Julia Deleon MRN:  [id number] Date of Birth: [birth year]

## 2019-08-08 ENCOUNTER — Other Ambulatory Visit: Payer: Self-pay | Admitting: Neurosurgery

## 2019-08-08 DIAGNOSIS — M5432 Sciatica, left side: Secondary | ICD-10-CM

## 2019-08-10 ENCOUNTER — Ambulatory Visit: Payer: Medicare Other | Attending: Urology | Admitting: Physical Therapy

## 2019-08-10 ENCOUNTER — Encounter: Payer: Medicare Other | Admitting: Physical Therapy

## 2019-08-10 ENCOUNTER — Other Ambulatory Visit: Payer: Self-pay

## 2019-08-10 DIAGNOSIS — M62838 Other muscle spasm: Secondary | ICD-10-CM | POA: Insufficient documentation

## 2019-08-10 DIAGNOSIS — R2689 Other abnormalities of gait and mobility: Secondary | ICD-10-CM | POA: Insufficient documentation

## 2019-08-10 DIAGNOSIS — G8929 Other chronic pain: Secondary | ICD-10-CM | POA: Insufficient documentation

## 2019-08-10 DIAGNOSIS — R293 Abnormal posture: Secondary | ICD-10-CM | POA: Diagnosis not present

## 2019-08-10 DIAGNOSIS — M25511 Pain in right shoulder: Secondary | ICD-10-CM | POA: Diagnosis not present

## 2019-08-10 NOTE — Therapy (Signed)
[place] MAIN [facility] SERVICES [address] [place], [place], [address] Phone: [phone]   Fax:  [phone]  Physical Therapy Treatment / Progress Note reporting from 07/06/19 to 08/10/19  Across 10 visits   Patient Details  Name: Julia Deleon MRN: [id number] Date of Birth: 09/08/1945 Referring Provider (PT): Dr. Maryan Puls   Encounter Date: 08/10/2019  PT End of Session - 08/10/19 1053    Visit Number  40    Date for PT Re-Evaluation  09/11/19   Progress note to report #74 (last on #64 07/06/19)   Authorization Type  Medicare    PT Start Time  1007    PT Stop Time  1100    PT Time Calculation (min)  53 min    Activity Tolerance  Patient tolerated treatment well;No increased pain    Behavior During Therapy  WFL for tasks assessed/performed       Past Medical History:  Diagnosis Date  . Arthritis   . COPD (chronic obstructive pulmonary disease) ([place])   . Family history of adverse reaction to anesthesia    father was slow to wake up  . Fuch's endothelial dystrophy   . Hyperlipidemia   . Hypothyroidism   . PONV (postoperative nausea and vomiting)    slow to wake up and PONV    Past Surgical History:  Procedure Laterality Date  . APPENDECTOMY    . BACK SURGERY  09/26/2018   L4-5 PLIF by Dr. Arnoldo Morale  . CARDIAC CATHETERIZATION    . CHOLECYSTECTOMY    . EYE SURGERY    . FOOT SURGERY Right    pin removed left  . laser vein surgery    . NASAL SINUS SURGERY    . RIGHT/LEFT HEART CATH AND CORONARY ANGIOGRAPHY N/A 12/06/2017   Procedure: RIGHT/LEFT HEART CATH AND CORONARY ANGIOGRAPHY;  Surgeon: Yolonda Kida, MD;  Location: [facility];  Service: Cardiovascular;  Laterality: N/A;  . TONSILLECTOMY      There were no vitals filed for this visit.  Subjective Assessment - 08/10/19 1307    Subjective  Pt reported she went to the gym yesterday. This morning she wokeup with the tightness in her L thigh         [facility] PT  Assessment - 08/10/19 1256      Other:   Other/ Comments  backward walking with increased hip ext       Posture/Postural Control   Posture Comments  64"       AROM   Overall AROM Comments  L thoracic rotation caused radiating pain to heel L,  centralized pain with traction of spine        Strength   Overall Strength Comments  hip abd L 3+/5       Palpation   Spinal mobility  tight mm along T5 intercostals R, R interspinals     Palpation comment  R ASIS more anterior, L rib 12 more anterior, R shoulder less IR in supine .                      [facility] Treatment/Exercise - 08/10/19 1256      Therapeutic Activites    Other Therapeutic Activities  reassessed goals, assessment in standing position on rotational curves       Neuro Re-ed    Neuro Re-ed Details   cued for more LLE weight bearing with dissociation of trunk/ pelvic with small rotations  Manual Therapy   Manual therapy comments  STM/ MWM along T5 intercostals R, R interspinals in standing                PT Short Term Goals - 06/08/19 1133      PT SHORT TERM GOAL #1   Title  Patient will demonstrate improved pelvic alignment and balance of musculature surrounding the pelvis to facilitate decreased PFM spasms and decrease pelvic pain.    Baseline  Pt. demonstrates severe posterior pelvic tilt, L thoracic/R lumbar scoliosis, and R anterior/L posterior pelvic obliquity. Pt. has demonstrated improved pelvic alignment and posture but has not been able to sustain long-term due to continued lack of postural strength.    Time  5    Period  Weeks    Status  Achieved    Target Date  03/06/19      PT SHORT TERM GOAL #2   Title  Patient will demonstrate a coordinated contraction, relaxation, and bulge of the pelvic floor muscles to demonstrate functional recruitment and motion and allow for bladder emptying.    Baseline  Pt. is unable to fully empty bladder and describes pain with catheterization,  demonstrating high tone of PFM.    Time  5    Period  Weeks    Status  Achieved    Target Date  03/06/19      PT SHORT TERM GOAL #3   Title  Patient will demonstrate improved sitting and standing posture to demonstrate learning and decrease stress on the pelvic floor with functional activity.    Baseline  Pt. demonstrates severe posterior pelvic tilt, L thoracic/R lumbar scoliosis, and R anterior/L posterior pelvic obliquity. 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 Will be able to cath without pain and will be cleared by physiscian to d/c self-cath due to decreased residual volume with bladder emptying.    Baseline  required to cath 3 times per day, painful each time.    Time  10    Period  Weeks    Status  Achieved      PT LONG TERM GOAL #4   Title  Patient will score less than or equal to 40% on the Female NIH-CPSI and 30% on the [facility] to demonstrate a reduction in pain, urinary symptoms, and an improved quality of life.    Baseline  Female NIH-CPSI: 38/43 (88%) , PDI: 45/75 (60%); PDI  44/75 (59%) and Female NIH-CPSI: 27/43 (64%) on 02-08-19 , Female NIH-CPSI: 17% on 03-26-19    Time  10    Period  Weeks    Status  Achieved      PT LONG TERM GOAL #5   Title  Patient will demonstrate increased step length with reciprical arm swing, and demonstrate no scissoring steps.    Baseline  Pt currently demonstrated scissoring steppage on R LE, increased trunk rotation and deep core engagement with reciprical walking poles.    Time  10    Period  Weeks    Status  Achieved      PT LONG TERM GOAL #6   Title  Pt will increase her distance on the 6MWT from 747 feet  to  > 900 feet  order to progress with aerobic exercises and walking longer distances in the community    Time  8    Period  Weeks    Status  On-going      PT LONG TERM GOAL #7   Title  Pt will report waking up > 2 mornings in a row with pain level < 5-6/10 in order to demo maintanence of Tx benefits and more balance of mm tensions achieved with HEP, shoe lift to improve QOL    Baseline  7-8/10 level and benefits from Tx lasted only one morning  (03-19-19 and 03-23-19)    Time  10    Period  Weeks    Status  Partially Met      PT LONG TERM GOAL #8   Title  Pt will be able to demo increased PF strength with unilateral UE support on wall from B 3/5 15 reps in order to achieve stronger push off in gait and walk longer distance    Baseline  B, L 7 reps  2/5, 10 reps R 3/5  ( 12/17: 15 reps R , 13 reps L, 06/01/19: R 20 reps, L 18 reps  )    Time  10    Period  Weeks    Status  Achieved      PT LONG TERM GOAL  #9   TITLE  Pt will demo shoulder abduction > 120 deg B with improved alignment of thorax over pelvis without manual cues in order to demo proper co-activation of B equal weight bearing in BLE, and decreased thoracic kyhposis/ decreased thoracic rotation 2/2 scoliosis in order to lift plates overhead cabinets/ decreased risk for    Time  10    Period  Weeks    Status  Achieved      PT LONG TERM GOAL  #10   TITLE   Pt will demo increased height from 63" to > 63 1/2 " and maintain 63 1/2" across 3 weeks ( once a week measurement) and forward head posture improve ( decreased distance from wall to earlobe 17 cm from earlobe to wall to > 12 cm)  in order to be ready to progress to PNF exercise with less shoulder pain and lengthen/ strengthen concave curve on R 2/2 scoliosis to minimize L lateral thigh tightness  (07/06/19 : 63 3/4",  08/10/19: 64"   )    Time  8    Period  Weeks    Status  Achieved      PT LONG TERM GOAL  #11   TITLE  Pt will demo single UE support with lunge position exercises/ yoga poses  in order to demo improved balance and IND with flexibility routine    Baseline  BUE support    Time  10    Period  Weeks    Status  On-going      PT LONG TERM GOAL  #12   TITLE  Pt will demo L trunk rotation in R sidelying with zero report of pain in order to progress with longer walking endurance    Time  10    Period  Weeks    Status  New      PT LONG TERM GOAL  #13   TITLE  Pt will demo increased  shoulder abd R against wall in mini squat from 55 deg to > 75 deg in order to demo increased thoracic extension and intercostal lengthening for functional BUE mobility without compensations.  ( 4/2: 88 deg R )    Time  8    Period  Weeks    Status  Achieved            Plan - 08/10/19 1256    Clinical Impression Statement  Pt has achieved 9/13 LTG and all her STG. Across the past 10 visits, pt has increased her height from 35  1/2" to 64" as her thoracic kyphosis has decreased significantly. Pt's thoracic rotational 2/2 to scoliosis is improving as pt's R shoulder is significantly less internally rotated with manual Tx. Pt's R shoulder abduction has increased from 55 deg to 88 deg ( measured against the wall to minimize compensation/ rotational component to scoliosis) .  R trunk lean has significantly improved in gait.   These improvements have helped pt to advance toward more standing exercises which  pt is able to tolerate more with longer periods and thus, will help her build endurance for walking longer distances.   Remaining issues to address involve the rotational component to her thoracolumbar curves 2/2 scoliosis. R ASIS and L thorax are more anterior which causes more closure of facets at L lumbar spine. Decreased anterior rotation of L sacrum ( 2/2  Fusion surgery) during gait  is leading to further more closure of facets. Assessment in standing showed that L thoracic rotation causes radiating pain down L heel but centralized when spine was positioned to promote traction of the spine.   Plan to address rotational components of thoracic/ lumbar and limitation of mobility of lumbopelvic joints in order to build longer endurance with walking with less tightness of L thigh and less occurrence of radiating pain.    Pt continues to benefit from skilled PT.       Personal Factors and Comorbidities  Age;Comorbidity 3+    Comorbidities  Osteoporosis, scoliosis, Arthritis, COPD, recent lumbosacral fusion for spondylolisthesis.    Examination-Activity Limitations  Toileting;Sit;Stand;Bend;Lift;Carry    Examination-Participation Restrictions  Interpersonal Relationship;Yard Work;Cleaning;Laundry    Stability/Clinical Decision Making  Unstable/Unpredictable    Rehab Potential  Good    PT Frequency  2x / week    PT Duration  Other (comment)   20   PT Treatment/Interventions  ADLs/Self Care Home Management;Aquatic Therapy;Moist Heat;Electrical Stimulation;Traction;Therapeutic activities;Functional mobility training;Stair training;Gait training;Therapeutic exercise;Balance training;Neuromuscular re-education;Patient/family education;Manual techniques;Dry needling;Passive range of motion;Scar mobilization;Taping    Consulted and Agree with Plan of Care  Patient       Patient will benefit  from skilled therapeutic intervention in order to improve the following deficits and impairments:  Abnormal  gait, Decreased balance, Increased muscle spasms, Decreased range of motion, Decreased scar mobility, Improper body mechanics, Decreased coordination, Decreased strength, Increased fascial restricitons, Impaired flexibility, Postural dysfunction, Pain  Visit Diagnosis: Abnormal posture  Chronic right shoulder pain  Other muscle spasm     Problem List Patient Active Problem List   Diagnosis Date Noted  . Chronic UTI 12/06/2018  . Status post lumbar surgery 12/06/2018  . Spondylolisthesis, lumbar region 09/26/2018  . Aortic atherosclerosis ([place]) 08/31/2018  . CAD (coronary artery disease) 02/23/2018  . Advanced care planning/counseling discussion 03/28/2017  . Varicose veins of both lower extremities with complications 09/62/8366  . Arthritis 12/09/2016  . Vaginal atrophy 09/10/2015  . Menopausal hot flushes 09/10/2015  . Onychomycosis due to dermatophyte 04/07/2015  . Bursitis of right shoulder 02/26/2015  . Environmental and seasonal allergies 02/26/2015  . Other allergic rhinitis 02/26/2015  . Osteoporosis 11/26/2014  . COPD (chronic obstructive pulmonary disease) ([place]) 11/26/2014  . Fuchs' corneal dystrophy 11/26/2014  . Benign hypertensive renal disease 11/26/2014  . Hyperlipidemia 11/26/2014  . Chronic kidney disease, stage III (moderate) ([place]) 11/26/2014  . Hypothyroidism 11/26/2014  . Endothelial corneal dystrophy 11/26/2014    Jerl Mina ,PT, DPT, E-RYT  08/10/2019, 1:07 PM  [place] MAIN [facility] SERVICES [address] [place], [place], [address] Phone: [phone]   Fax:  [phone]  Name: Julia Deleon MRN: [id number] Date of Birth: Sep 21, 1945

## 2019-08-14 ENCOUNTER — Ambulatory Visit: Payer: Medicare Other | Admitting: Physical Therapy

## 2019-08-14 ENCOUNTER — Other Ambulatory Visit: Payer: Self-pay

## 2019-08-14 DIAGNOSIS — G8929 Other chronic pain: Secondary | ICD-10-CM

## 2019-08-14 DIAGNOSIS — M25511 Pain in right shoulder: Secondary | ICD-10-CM

## 2019-08-14 DIAGNOSIS — R293 Abnormal posture: Secondary | ICD-10-CM | POA: Diagnosis not present

## 2019-08-14 DIAGNOSIS — M62838 Other muscle spasm: Secondary | ICD-10-CM | POA: Diagnosis not present

## 2019-08-14 DIAGNOSIS — R2689 Other abnormalities of gait and mobility: Secondary | ICD-10-CM | POA: Diagnosis not present

## 2019-08-14 NOTE — Patient Instructions (Signed)
Side stepping at door way with hands pushing against wall  3 min

## 2019-08-14 NOTE — Therapy (Signed)
[facility] MA[facility] REH[facility] SERVICES [address] [place], [place], [address] Phone: [phone]   Fax:  [phone]  Physical Therapy Treatment  Patient Details  Name: Julia Deleon MRN: [id number] Date of Birth: 06/24/1945 Referring Provider (PT): Dr. Michael Wolff   Encounter Date: 08/14/2019  PT End of Session - 08/14/19 1908    Visit Number  75    Date for PT Re-Evaluation  09/11/19   Progress note to report #84 (last on #74 08/10/19)   Authorization Type  Medicare    PT Start Time  1408    PT Stop Time  1505    PT Time Calculation (min)  57 min    Activity Tolerance  Patient tolerated treatment well;No increased pain    Behavior During Therapy  WFL for tasks assessed/performed       Past Medical History:  Diagnosis Date  . Arthritis   . COPD (chronic obstructive pulmonary disease) (HCC)   . Family history of adverse reaction to anesthesia    father was slow to wake up  . Fuch's endothelial dystrophy   . Hyperlipidemia   . Hypothyroidism   . PONV (postoperative nausea and vomiting)    slow to wake up and PONV    Past Surgical History:  Procedure Laterality Date  . APPENDECTOMY    . BACK SURGERY  09/26/2018   L4-5 PLIF by Dr. Jenkins  . CARDIAC CATHETERIZATION    . CHOLECYSTECTOMY    . EYE SURGERY    . FOOT SURGERY Right    pin removed left  . laser vein surgery    . NASAL SINUS SURGERY    . RIGHT/LEFT HEART CATH AND CORONARY ANGIOGRAPHY N/A 12/06/2017   Procedure: RIGHT/LEFT HEART CATH AND CORONARY ANGIOGRAPHY;  Surgeon: Callwood, Dwayne D, MD;  Location: [facility] INVASIVE CV LAB;  Service: Cardiovascular;  Laterality: N/A;  . TONSILLECTOMY      There were no vitals filed for this visit.  Subjective Assessment - 08/14/19 1514    Subjective  Pt reported she felt sore after last session but that that night she slept better and woke up feeling more rested and less tightness in her L thigh for the last time in a long time. The  tightness came back later that day. The following morning, pt woke up feeling the usual.  Pt walked 2.7 miles with granddtr shoppping and she felt exhausted last night and she could not find any comfortable position when sleeping.         [facility] PT Assessment - 08/14/19 1521      Observation/Other Assessments   Observations  pushing exercise with uper body activation, decreased lower kinetic chain activation      Strength   Overall Strength Comments  hip abd R 3-/5, L L 3+/5       Palpation   Spinal mobility  tight mm along  intercostals T5-7 L                    [facility] Adult PT Treatment/Exercise - 08/14/19 1821      Neuro Re-ed    Neuro Re-ed Details   cued for weightbearing into BLE  with pushing. cued for isometric wall-push up/ side stepping        Moist Heat Therapy   Number Minutes Moist Heat  5 Minutes    Moist Heat Location  Other (comment)   spine     Manual Therapy   Manual therapy comments  STM/MWM   at L T5-7                  PT Short Term Goals - 06/08/19 1133      PT SHORT TERM GOAL #1   Title  Patient will demonstrate improved pelvic alignment and balance of musculature surrounding the pelvis to facilitate decreased PFM spasms and decrease pelvic pain.    Baseline  Pt. demonstrates severe posterior pelvic tilt, L thoracic/R lumbar scoliosis, and R anterior/L posterior pelvic obliquity. Pt. has demonstrated improved pelvic alignment and posture but has not been able to sustain long-term due to continued lack of postural strength.    Time  5    Period  Weeks    Status  Achieved    Target Date  03/06/19      PT SHORT TERM GOAL #2   Title  Patient will demonstrate a coordinated contraction, relaxation, and bulge of the pelvic floor muscles to demonstrate functional recruitment and motion and allow for bladder emptying.    Baseline  Pt. is unable to fully empty bladder and describes pain with catheterization, demonstrating high tone of PFM.     Time  5    Period  Weeks    Status  Achieved    Target Date  03/06/19      PT SHORT TERM GOAL #3   Title  Patient will demonstrate improved sitting and standing posture to demonstrate learning and decrease stress on the pelvic floor with functional activity.    Baseline  Pt. demonstrates severe posterior pelvic tilt, L thoracic/R lumbar scoliosis, and R anterior/L posterior pelvic obliquity. 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 Will be able to cath without pain and will be cleared by physiscian to d/c self-cath due to decreased residual volume with bladder emptying.    Baseline  required to cath 3 times per day, painful each time.    Time  10    Period  Weeks    Status  Achieved      PT LONG TERM GOAL #4   Title  Patient will score less than or equal to 40% on the Female NIH-CPSI   and 30% on the PDI to demonstrate a reduction in pain, urinary symptoms, and an improved quality of life.    Baseline  Female NIH-CPSI: 38/43 (88%) , PDI: 45/75 (60%); PDI 44/75 (59%) and Female NIH-CPSI: 27/43  (64%) on 02-08-19 , Female NIH-CPSI: 17% on 03-26-19    Time  10    Period  Weeks    Status  Achieved      PT LONG TERM GOAL #5   Title  Patient will demonstrate increased step length with reciprical arm swing, and demonstrate no scissoring steps.    Baseline  Pt currently demonstrated scissoring steppage on R LE, increased trunk rotation and deep core engagement with reciprical walking poles.    Time  10    Period  Weeks    Status  Achieved      PT LONG TERM GOAL #6   Title  Pt will increase her distance on the 6MWT from 747 feet  to  > 900 feet  order to progress with aerobic exercises and walking longer distances in the community    Time  8    Period  Weeks    Status  On-going      PT LONG TERM GOAL #7   Title  Pt will report waking up > 2 mornings in a row with pain level < 5-6/10 in order to demo maintanence of Tx benefits and more balance of mm tensions achieved with HEP, shoe lift to improve QOL    Baseline  7-8/10 level and benefits from Tx lasted only one morning  (03-19-19 and 03-23-19)    Time  10    Period  Weeks    Status  Partially Met      PT LONG TERM GOAL #8   Title  Pt will be able to demo increased PF strength with unilateral UE support on wall from B 3/5 15 reps in order to achieve stronger push off in gait and walk longer distance    Baseline  B, L 7 reps  2/5, 10 reps R 3/5  ( 12/17: 15 reps R , 13 reps L, 06/01/19: R 20 reps, L 18 reps  )    Time  10    Period  Weeks    Status  Achieved      PT LONG TERM GOAL  #9   TITLE  Pt will demo shoulder abduction > 120 deg B with improved alignment of thorax over pelvis without manual cues in order to demo proper co-activation of B equal weight bearing in BLE, and decreased thoracic kyhposis/ decreased thoracic rotation 2/2 scoliosis in order to lift plates overhead cabinets/ decreased risk for    Time  10    Period  Weeks    Status  Achieved      PT LONG TERM GOAL  #10   TITLE  Pt will demo increased height from 63"  to > 63 1/2 " and maintain 63 1/2" across 3 weeks ( once a week measurement) and forward head posture improve ( decreased distance from wall to earlobe 17 cm from earlobe to wall to > 12 cm)  in order to be ready to progress to PNF exercise with less shoulder pain and lengthen/ strengthen concave curve on R 2/2 scoliosis to minimize L lateral thigh tightness  (07/06/19 : 63 3/4",  08/10/19: 64"   )    Time  8    Period  Weeks    Status  Achieved        PT LONG TERM GOAL  #11   TITLE  Pt will demo single UE support with lunge position exercises/ yoga poses  in order to demo improved balance and IND with flexibility routine    Baseline  BUE support    Time  10    Period  Weeks    Status  On-going      PT LONG TERM GOAL  #12   TITLE  Pt will demo L trunk rotation in R sidelying with zero report of pain in order to progress with longer walking endurance    Time  10    Period  Weeks    Status  New      PT LONG TERM GOAL  #13   TITLE  Pt will demo increased  shoulder abd R against wall in mini squat from 55 deg to > 75 deg in order to demo increased thoracic extension and intercostal lengthening for functional BUE mobility without compensations.  ( 4/2: 88 deg R )    Time  8    Period  Weeks    Status  Achieved            Plan - 08/14/19 1908    Clinical Impression Statement  Pt required manual Tx to minimize tightness along intercostal mm T5-7 L which pt tolerated without complaints. Introduced isometric push up by wall with side stepping exercise to continue promoting lower kinetic chain co-activation with lengthening of thoracic spine. Pt demo'd IND with cues. Pt performed pushing exercise with moderate cues. Pt continues to benefit from skilled PT   Personal Factors and Comorbidities  Age;Comorbidity 3+    Comorbidities  Osteoporosis, scoliosis, Arthritis, COPD, recent lumbosacral fusion for spondylolisthesis.    Examination-Activity Limitations  Toileting;Sit;Stand;Bend;Lift;Carry     Examination-Participation Restrictions  Interpersonal Relationship;Yard Work;Cleaning;Laundry    Stability/Clinical Decision Making  Unstable/Unpredictable    Rehab Potential  Good    PT Frequency  2x / week    PT Duration  Other (comment)   20   PT Treatment/Interventions  ADLs/Self Care Home Management;Aquatic Therapy;Moist Heat;Electrical Stimulation;Traction;Therapeutic activities;Functional mobility training;Stair training;Gait training;Therapeutic exercise;Balance training;Neuromuscular re-education;Patient/family education;Manual techniques;Dry needling;Passive range of motion;Scar mobilization;Taping    Consulted and Agree with Plan of Care  Patient       Patient will benefit from skilled therapeutic intervention in order to improve the following deficits and impairments:  Abnormal gait, Decreased balance, Increased muscle spasms, Decreased range of motion, Decreased scar mobility, Improper body mechanics, Decreased coordination, Decreased strength, Increased fascial restricitons, Impaired flexibility, Postural dysfunction, Pain  Visit Diagnosis: Abnormal posture  Chronic right shoulder pain  Other muscle spasm     Problem List Patient Active Problem List   Diagnosis Date Noted  . Chronic UTI 12/06/2018  . Status post lumbar surgery 12/06/2018  . Spondylolisthesis, lumbar region 09/26/2018  . Aortic atherosclerosis (HCC) 08/31/2018  . CAD (coronary artery disease) 02/23/2018  . Advanced care planning/counseling discussion 03/28/2017  . Varicose veins of both lower extremities with complications 01/04/2017  . Arthritis 12/09/2016  . Vaginal atrophy 09/10/2015  . Menopausal hot flushes 09/10/2015  . Onychomycosis due to dermatophyte 04/07/2015  . Bursitis of right shoulder 02/26/2015  . Environmental and seasonal allergies 02/26/2015  . Other allergic rhinitis 02/26/2015  . Osteoporosis 11/26/2014  . COPD (chronic obstructive pulmonary disease) (HCC) 11/26/2014  .  Fuchs' corneal dystrophy 11/26/2014  . Benign hypertensive renal disease 11/26/2014  . Hyperlipidemia 11/26/2014  . Chronic kidney disease, stage III (moderate) (HCC) 11/26/2014  . Hypothyroidism   11/26/2014  . Endothelial corneal dystrophy 11/26/2014    Jerl Mina ,PT, DPT, E-RYT  08/14/2019, 7:11 PM  [place] [facility] SERVICES [address] [place], [place], [address] Phone: [phone]   Fax:  [phone]  Name: Julia Deleon MRN: [id number] Date of Birth: [birth year]

## 2019-08-16 ENCOUNTER — Other Ambulatory Visit: Payer: Self-pay

## 2019-08-16 ENCOUNTER — Ambulatory Visit: Payer: Medicare Other | Admitting: Physical Therapy

## 2019-08-16 DIAGNOSIS — R293 Abnormal posture: Secondary | ICD-10-CM | POA: Diagnosis not present

## 2019-08-16 DIAGNOSIS — M25511 Pain in right shoulder: Secondary | ICD-10-CM | POA: Diagnosis not present

## 2019-08-16 DIAGNOSIS — R2689 Other abnormalities of gait and mobility: Secondary | ICD-10-CM | POA: Diagnosis not present

## 2019-08-16 DIAGNOSIS — M62838 Other muscle spasm: Secondary | ICD-10-CM

## 2019-08-16 DIAGNOSIS — G8929 Other chronic pain: Secondary | ICD-10-CM | POA: Diagnosis not present

## 2019-08-16 NOTE — Therapy (Signed)
[facility] RE[facility] SERVICES [address] [place], [place], [address] Phone: [phone]   Fax:  [phone]  Physical Therapy Treatment  Patient Details  Name: Julia Deleon MRN: [id number] Date of Birth: 05/22/1945 Referring Provider (PT): Dr. Michael Wolff   Encounter Date: 08/16/2019  PT End of Session - 08/16/19 2203    Visit Number  76    Date for PT Re-Evaluation  09/11/19   Progress note to report #84 (last on #74 08/10/19)   Authorization Type  Medicare    PT Start Time  1604    PT Stop Time  1700    PT Time Calculation (min)  56 min    Activity Tolerance  Patient tolerated treatment well;No increased pain    Behavior During Therapy  WFL for tasks assessed/performed       Past Medical History:  Diagnosis Date  . Arthritis   . COPD (chronic obstructive pulmonary disease) (HCC)   . Family history of adverse reaction to anesthesia    father was slow to wake up  . Fuch's endothelial dystrophy   . Hyperlipidemia   . Hypothyroidism   . PONV (postoperative nausea and vomiting)    slow to wake up and PONV    Past Surgical History:  Procedure Laterality Date  . APPENDECTOMY    . BACK SURGERY  09/26/2018   L4-5 PLIF by Dr. Jenkins  . CARDIAC CATHETERIZATION    . CHOLECYSTECTOMY    . EYE SURGERY    . FOOT SURGERY Right    pin removed left  . laser vein surgery    . NASAL SINUS SURGERY    . RIGHT/LEFT HEART CATH AND CORONARY ANGIOGRAPHY N/A 12/06/2017   Procedure: RIGHT/LEFT HEART CATH AND CORONARY ANGIOGRAPHY;  Surgeon: Callwood, Dwayne D, MD;  Location: [facility] INVASIVE CV LAB;  Service: Cardiovascular;  Laterality: N/A;  . TONSILLECTOMY      There were no vitals filed for this visit.  Subjective Assessment - 08/16/19 1610    Subjective  Pt reported no issues with new exercise         [facility] PT Assessment - 08/16/19 2151      Observation/Other Assessments   Observations  stretching in positions with thoracic kyphosis,  fast movements       Posture/Postural Control   Posture Comments  standing exercises trialed for 10 min ( stnading squats with lat pull down Blue band 20 reps, 30 reps of marching with bands for lat strengthening. no report of pain.       AROM   Overall AROM Comments  standing by wall / shoulder abduction 60 deg B                    [facility] Adult PT Treatment/Exercise - 08/16/19 2151      Therapeutic Activites    Other Therapeutic Activities  explained the principles of stretching, relaxed positions, thoracic extension positions, use of straps in supine position        Neuro Re-ed    Neuro Re-ed Details   cued for slower movements with stretches, more anterior COM with lat strengthening exercises , less hunched position                PT Short Term Goals - 06/08/19 1133      PT SHORT TERM GOAL #1   Title  Patient will demonstrate improved pelvic alignment and balance of musculature surrounding the pelvis to facilitate decreased PFM spasms and   decrease pelvic pain.    Baseline  Pt. demonstrates severe posterior pelvic tilt, L thoracic/R lumbar scoliosis, and R anterior/L posterior pelvic obliquity. Pt. has demonstrated improved pelvic alignment and posture but has not been able to sustain long-term due to continued lack of postural strength.    Time  5    Period  Weeks    Status  Achieved    Target Date  03/06/19      PT SHORT TERM GOAL #2   Title  Patient will demonstrate a coordinated contraction, relaxation, and bulge of the pelvic floor muscles to demonstrate functional recruitment and motion and allow for bladder emptying.    Baseline  Pt. is unable to fully empty bladder and describes pain with catheterization, demonstrating high tone of PFM.    Time  5    Period  Weeks    Status  Achieved    Target Date  03/06/19      PT SHORT TERM GOAL #3   Title  Patient will demonstrate improved sitting and standing posture to demonstrate learning and decrease stress  on the pelvic floor with functional activity.    Baseline  Pt. demonstrates severe posterior pelvic tilt, L thoracic/R lumbar scoliosis, and R anterior/L posterior pelvic obliquity. 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 Will be able to cath without pain and will be cleared by physiscian to d/c self-cath due to decreased residual volume with bladder emptying.    Baseline  required to cath 3 times per day, painful each time.    Time  10    Period  Weeks    Status  Achieved      PT LONG TERM GOAL #4   Title  Patient will score less than or equal to 40% on the Female NIH-CPSI and 30% on the [facility] to demonstrate a reduction in pain, urinary symptoms, and an improved quality of life.    Baseline  Female NIH-CPSI: 38/43 (88%) , PDI: 45/75 (60%); PDI 44/75 (59%) and Female NIH-CPSI: 27/43 (64%) on 02-08-19 , Female NIH-CPSI: 17% on 03-26-19    Time  10    Period  Weeks  Status  Achieved      PT LONG TERM GOAL #5   Title  Patient will demonstrate increased step length with reciprical arm swing, and  demonstrate no scissoring steps.    Baseline  Pt currently demonstrated scissoring steppage on R LE, increased trunk rotation and deep core engagement with reciprical walking poles.    Time  10    Period  Weeks    Status  Achieved      PT LONG TERM GOAL #6   Title  Pt will increase her distance on the 6MWT from 747 feet  to  > 900 feet  order to progress with aerobic exercises and walking longer distances in the community    Time  8    Period  Weeks    Status  On-going      PT LONG TERM GOAL #7   Title  Pt will report waking up > 2 mornings in a row with pain level < 5-6/10 in order to demo maintanence of Tx benefits and more balance of mm tensions achieved with HEP, shoe lift to improve QOL    Baseline  7-8/10 level and benefits from Tx lasted only one morning  (03-19-19 and 03-23-19)    Time  10    Period  Weeks    Status  Partially Met      PT LONG TERM GOAL #8   Title  Pt will be able to demo increased PF strength with unilateral UE support on wall from B 3/5 15 reps in order to achieve stronger push off in gait and walk longer distance    Baseline  B, L 7 reps  2/5, 10 reps R 3/5  ( 12/17: 15 reps R , 13 reps L, 06/01/19: R 20 reps, L 18 reps  )    Time  10    Period  Weeks    Status  Achieved      PT LONG TERM GOAL  #9   TITLE  Pt will demo shoulder abduction > 120 deg B with improved alignment of thorax over pelvis without manual cues in order to demo proper co-activation of B equal weight bearing in BLE, and decreased thoracic kyhposis/ decreased thoracic rotation 2/2 scoliosis in order to lift plates overhead cabinets/ decreased risk for    Time  10    Period  Weeks    Status  Achieved      PT LONG TERM GOAL  #10   TITLE  Pt will demo increased height from 63" to > 63 1/2 " and maintain 63 1/2" across 3 weeks ( once a week measurement) and forward head posture improve ( decreased distance from wall to earlobe 17 cm from earlobe to wall to > 12 cm)  in order to be ready to  progress to PNF exercise with less shoulder pain and lengthen/ strengthen concave curve on R 2/2 scoliosis to minimize L lateral thigh tightness  (07/06/19 : 63 3/4",  08/10/19: 64"   )    Time  8    Period  Weeks    Status  Achieved      PT LONG TERM GOAL  #11   TITLE  Pt will demo single UE support with lunge position exercises/ yoga poses  in order to demo improved balance and IND with flexibility routine    Baseline  BUE support    Time  10    Period  Weeks    Status  On-going  PT LONG TERM GOAL  #12   TITLE  Pt will demo L trunk rotation in R sidelying with zero report of pain in order to progress with longer walking endurance    Time  10    Period  Weeks    Status  New      PT LONG TERM GOAL  #13   TITLE  Pt will demo increased  shoulder abd R against wall in mini squat from 55 deg to > 75 deg in order to demo increased thoracic extension and intercostal lengthening for functional BUE mobility without compensations.  ( 4/2: 88 deg R )    Time  8    Period  Weeks    Status  Achieved            Plan - 08/16/19 2203    Clinical Impression Statement  Pt is tolerating standing exercises with longer durations without report of tightness/ pain. Continue to add spinal extension/ lengthening exercises with focus on latissmus and thoracolumbar strengthening exercises.  Focused on education about stretching principles and guided stretches with strap. Plan to continue to guide pt on stretches.  Pt continues to benefit from skilled PT   Personal Factors and Comorbidities  Age;Comorbidity 3+    Comorbidities  Osteoporosis, scoliosis, Arthritis, COPD, recent lumbosacral fusion for spondylolisthesis.    Examination-Activity Limitations  Toileting;Sit;Stand;Bend;Lift;Carry    Examination-Participation Restrictions  Interpersonal Relationship;Yard Work;Cleaning;Laundry    Stability/Clinical Decision Making  Unstable/Unpredictable    Rehab Potential  Good    PT Frequency  2x / week    PT  Duration  Other (comment)   20   PT Treatment/Interventions  ADLs/Self Care Home Management;Aquatic Therapy;Moist Heat;Electrical Stimulation;Traction;Therapeutic activities;Functional mobility training;Stair training;Gait training;Therapeutic exercise;Balance training;Neuromuscular re-education;Patient/family education;Manual techniques;Dry needling;Passive range of motion;Scar mobilization;Taping    Consulted and Agree with Plan of Care  Patient       Patient will benefit from skilled therapeutic intervention in order to improve the following deficits and impairments:  Abnormal gait, Decreased balance, Increased muscle spasms, Decreased range of motion, Decreased scar mobility, Improper body mechanics, Decreased coordination, Decreased strength, Increased fascial restricitons, Impaired flexibility, Postural dysfunction, Pain  Visit Diagnosis: Abnormal posture  Chronic right shoulder pain  Other muscle spasm     Problem List Patient Active Problem List   Diagnosis Date Noted  . Chronic UTI 12/06/2018  . Status post lumbar surgery 12/06/2018  . Spondylolisthesis, lumbar region 09/26/2018  . Aortic atherosclerosis ([place]) 08/31/2018  . CAD (coronary artery disease) 02/23/2018  . Advanced care planning/counseling discussion 03/28/2017  . Varicose veins of both lower extremities with complications 14/78/2956  . Arthritis 12/09/2016  . Vaginal atrophy 09/10/2015  . Menopausal hot flushes 09/10/2015  . Onychomycosis due to dermatophyte 04/07/2015  . Bursitis of right shoulder 02/26/2015  . Environmental and seasonal allergies 02/26/2015  . Other allergic rhinitis 02/26/2015  . Osteoporosis 11/26/2014  . COPD (chronic obstructive pulmonary disease) ([place]) 11/26/2014  . Fuchs' corneal dystrophy 11/26/2014  . Benign hypertensive renal disease 11/26/2014  . Hyperlipidemia 11/26/2014  . Chronic kidney disease, stage III (moderate) ([place]) 11/26/2014  . Hypothyroidism 11/26/2014  .  Endothelial corneal dystrophy 11/26/2014    Jerl Mina ,PT, DPT, E-RYT  08/16/2019, 10:08 PM  [place] [facility] SERVICES [address] [place], [place], [address] Phone: [phone]   Fax:  [phone]  Name: Julia Deleon MRN: [id number] Date of Birth: 01-04-1946

## 2019-08-16 NOTE — Patient Instructions (Signed)
Continue with wall push up side stepping  Will review stretches again next week

## 2019-08-21 ENCOUNTER — Other Ambulatory Visit: Payer: Self-pay | Admitting: Obstetrics & Gynecology

## 2019-08-21 ENCOUNTER — Other Ambulatory Visit: Payer: Self-pay | Admitting: Urology

## 2019-08-21 ENCOUNTER — Ambulatory Visit: Payer: Medicare Other | Admitting: Physical Therapy

## 2019-08-21 ENCOUNTER — Other Ambulatory Visit: Payer: Self-pay | Admitting: Neurosurgery

## 2019-08-21 ENCOUNTER — Other Ambulatory Visit: Payer: Self-pay

## 2019-08-21 ENCOUNTER — Ambulatory Visit
Admission: RE | Admit: 2019-08-21 | Discharge: 2019-08-21 | Disposition: A | Discharge status: Home / Self Care | Payer: Medicare Other | Source: Ambulatory Visit | Attending: Neurosurgery | Admitting: Neurosurgery

## 2019-08-21 DIAGNOSIS — R293 Abnormal posture: Secondary | ICD-10-CM

## 2019-08-21 DIAGNOSIS — N951 Menopausal and female climacteric states: Secondary | ICD-10-CM

## 2019-08-21 DIAGNOSIS — M25511 Pain in right shoulder: Secondary | ICD-10-CM

## 2019-08-21 DIAGNOSIS — G8929 Other chronic pain: Secondary | ICD-10-CM | POA: Diagnosis not present

## 2019-08-21 DIAGNOSIS — N952 Postmenopausal atrophic vaginitis: Secondary | ICD-10-CM

## 2019-08-21 DIAGNOSIS — M5432 Sciatica, left side: Secondary | ICD-10-CM

## 2019-08-21 DIAGNOSIS — M545 Low back pain: Secondary | ICD-10-CM | POA: Diagnosis not present

## 2019-08-21 DIAGNOSIS — R2689 Other abnormalities of gait and mobility: Secondary | ICD-10-CM | POA: Diagnosis not present

## 2019-08-21 DIAGNOSIS — M62838 Other muscle spasm: Secondary | ICD-10-CM | POA: Diagnosis not present

## 2019-08-21 MED ORDER — GADOBUTROL 1 MMOL/ML IV SOLN: 7.0000 mL | Freq: Once | INTRAVENOUS | Status: DC | PRN

## 2019-08-21 NOTE — Patient Instructions (Signed)
Tip the teapot  Pushing L leg into the ground    R sidelying,  Grab mattress, slight L rotation with L low back lengthened  10 reps

## 2019-08-21 NOTE — Therapy (Addendum)
[facility] [address] [place], [place], [address] Phone: [phone]   Fax:  [phone]  Physical Therapy Treatment  Patient Details  Name: Julia Deleon MRN: [id number] Date of Birth: 1946/03/07 Referring Provider (PT): Dr. Maryan Puls   Encounter Date: 08/21/2019  PT End of Session - 08/21/19 1038    Visit Number  [id number]    Date for PT Re-Evaluation  09/11/19   Progress note to report #84 (last on #74 08/10/19)   Authorization Type  Medicare    PT Start Time  6010    PT Stop Time  9323    PT Time Calculation (min)  61 min    Activity Tolerance  Patient tolerated treatment well;No increased pain    Behavior During Therapy  WFL for tasks assessed/performed       Past Medical History:  Diagnosis Date  . Arthritis   . COPD (chronic obstructive pulmonary disease) ([place])   . Family history of adverse reaction to anesthesia    father was slow to wake up  . Fuch's endothelial dystrophy   . Hyperlipidemia   . Hypothyroidism   . PONV (postoperative nausea and vomiting)    slow to wake up and PONV    Past Surgical History:  Procedure Laterality Date  . APPENDECTOMY    . BACK SURGERY  09/26/2018   L4-5 PLIF by Dr. Arnoldo Morale  . CARDIAC CATHETERIZATION    . CHOLECYSTECTOMY    . EYE SURGERY    . FOOT SURGERY Right    pin removed left  . laser vein surgery    . NASAL SINUS SURGERY    . RIGHT/LEFT HEART CATH AND CORONARY ANGIOGRAPHY N/A 12/06/2017   Procedure: RIGHT/LEFT HEART CATH AND CORONARY ANGIOGRAPHY;  Surgeon: Yolonda Kida, MD;  Location: [facility];  Service: Cardiovascular;  Laterality: N/A;  . TONSILLECTOMY      There were no vitals filed for this visit.  Subjective Assessment - 08/21/19 0943    Subjective  pt reported the stretches from last session did not decrease the tightness/ pain.         [facility] PT Assessment - 08/21/19 1039      Posture/Postural Control   Posture Comments  standing  against PT pushing isometric for 15 min without report of fatigue       AROM   Overall AROM Comments  standing: slight rotation L and R no report of radiating pain.  standing by wall, minisquat:  85 deg R, 90 deg L        Palpation   Spinal mobility  standing: L interspinal tightness at levels T10-12 L , paraspinals t-lumbar no longer tight compared to last week ( this is area of covex curve) . scapula levelled.                     [facility] PT Treatment/Exercise - 08/21/19 1042      Neuro Re-ed    Neuro Re-ed Details   cued for isolated thoracic rotation with length at lumbar,        Moist Heat Therapy   Number Minutes Moist Heat  5 Minutes    Moist Heat Location  --   L flank      Manual Therapy   Manual therapy comments  standing traction at T10-12, sidelying R, STM/MWM to increase intercostal space anterior/lateral/ posterior T10-11 and promote L rotation with distraction at QL  PT Short Term Goals - 06/08/19 1133      PT SHORT TERM GOAL #1   Title  Patient will demonstrate improved pelvic alignment and balance of musculature surrounding the pelvis to facilitate decreased PFM spasms and decrease pelvic pain.    Baseline  Pt. demonstrates severe posterior pelvic tilt, L thoracic/R lumbar scoliosis, and R anterior/L posterior pelvic obliquity. Pt. has demonstrated improved pelvic alignment and posture but has not been able to sustain long-term due to continued lack of postural strength.    Time  5    Period  Weeks    Status  Achieved    Target Date  03/06/19      PT SHORT TERM GOAL #2   Title  Patient will demonstrate a coordinated contraction, relaxation, and bulge of the pelvic floor muscles to demonstrate functional recruitment and motion and allow for bladder emptying.    Baseline  Pt. is unable to fully empty bladder and describes pain with catheterization, demonstrating high tone of PFM.    Time  5    Period  Weeks    Status  Achieved     Target Date  03/06/19      PT SHORT TERM GOAL #3   Title  Patient will demonstrate improved sitting and standing posture to demonstrate learning and decrease stress on the pelvic floor with functional activity.    Baseline  Pt. demonstrates severe posterior pelvic tilt, L thoracic/R lumbar scoliosis, and R anterior/L posterior pelvic obliquity. 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 Will be able to cath without pain and will be cleared by physiscian to d/c self-cath due to decreased residual volume with bladder emptying.    Baseline  required to cath 3 times per day, painful each time.    Time  10    Period  Weeks    Status  Achieved      PT LONG TERM GOAL #4   Title  Patient will score less than or equal to 40% on the Female NIH-CPSI and 30% on the [facility] to demonstrate a reduction in pain, urinary symptoms, and an improved quality of life.  Baseline  Female NIH-CPSI: 38/43 (88%) , PDI: 45/75 (60%); PDI 44/75 (59%) and Female NIH-CPSI: 27/43 (64%) on 02-08-19 , Female NIH-CPSI: 17% on  03-26-19    Time  10    Period  Weeks    Status  Achieved      PT LONG TERM GOAL #5   Title  Patient will demonstrate increased step length with reciprical arm swing, and demonstrate no scissoring steps.    Baseline  Pt currently demonstrated scissoring steppage on R LE, increased trunk rotation and deep core engagement with reciprical walking poles.    Time  10    Period  Weeks    Status  Achieved      PT LONG TERM GOAL #6   Title  Pt will increase her distance on the 6MWT from 747 feet  to  > 900 feet  order to progress with aerobic exercises and walking longer distances in the community    Time  8    Period  Weeks    Status  On-going      PT LONG TERM GOAL #7   Title  Pt will report waking up > 2 mornings in a row with pain level < 5-6/10 in order to demo maintanence of Tx benefits and more balance of mm tensions achieved with HEP, shoe lift to improve QOL    Baseline  7-8/10 level and benefits from Tx lasted only one morning  (03-19-19 and 03-23-19)    Time  10    Period  Weeks    Status  Partially Met      PT LONG TERM GOAL #8   Title  Pt will be able to demo increased PF strength with unilateral UE support on wall from B 3/5 15 reps in order to achieve stronger push off in gait and walk longer distance    Baseline  B, L 7 reps  2/5, 10 reps R 3/5  ( 12/17: 15 reps R , 13 reps L, 06/01/19: R 20 reps, L 18 reps  )    Time  10    Period  Weeks    Status  Achieved      PT LONG TERM GOAL  #9   TITLE  Pt will demo shoulder abduction > 120 deg B with improved alignment of thorax over pelvis without manual cues in order to demo proper co-activation of B equal weight bearing in BLE, and decreased thoracic kyhposis/ decreased thoracic rotation 2/2 scoliosis in order to lift plates overhead cabinets/ decreased risk for    Time  10    Period  Weeks    Status  Achieved      PT LONG TERM GOAL  #10   TITLE  Pt will demo increased height from 63" to > 63 1/2 " and maintain 63 1/2" across 3  weeks ( once a week measurement) and forward head posture improve ( decreased distance from wall to earlobe 17 cm from earlobe to wall to > 12 cm)  in order to be ready to progress to PNF exercise with less shoulder pain and lengthen/ strengthen concave curve on R 2/2 scoliosis to minimize L lateral thigh tightness  (07/06/19 : 63 3/4",  08/10/19: 64"   )    Time  8    Period  Weeks    Status  Achieved      PT LONG TERM GOAL  #11   TITLE  Pt will demo single UE support with lunge position exercises/ yoga poses  in order to demo improved balance and IND with flexibility routine    Baseline  BUE support    Time  10    Period  Weeks    Status  On-going      PT LONG TERM GOAL  #12   TITLE  Pt will demo L trunk rotation in R sidelying with zero report of pain in order to progress with longer walking endurance    Time  10    Period  Weeks    Status  New      PT LONG TERM GOAL  #13   TITLE  Pt will demo increased  shoulder abd R against wall in mini squat from 55 deg to > 75 deg in order to demo increased thoracic extension and intercostal lengthening for functional BUE mobility without compensations.  ( 4/2: 88 deg R )    Time  8    Period  Weeks    Status  Achieved            Plan - 08/21/19 1039    Clinical Impression Statement  Pt showed decrease L  paraspinal mm tightness but had remaining tightness at L T10-12 interspinal and intercostal mm where convex curve lies 2/2 scoliosis. Pt shows longer standing balance with more ability to withstand resistance before report of fatigue and needing to sit down ( ~15 min today). Provided traction at T/L junction in standing position and later in sidelying position. Manual Tx facilitated lengthening and space in intercostal space T10-12 for slight thoracic rotation with lengthened lumbar area. After manual Tx, pt was able to rotation L with dissassociation from trunk /pelvis and did not report radiating pain down L leg. This will help pt minimize  sciatic/ tightness of L thigh as facets of lumbar spine are not getting closed with rotation. Pt will be getting MRI this afternoon ordered by her spine surgeon.  Plan to modify POC based on her results and her plan to f/u with adjunct Tx for pain management with spinal surgeon.  Continue to treat orthopedic issues to minimize worsening of scoliosis/ thoracic kyphosis to increase upright posture, spinal extension, and walking endurance.  Pt continues to benefit from skilled PT.     Personal Factors and Comorbidities  Age;Comorbidity 3+    Comorbidities  Osteoporosis, scoliosis, Arthritis, COPD, recent lumbosacral fusion for spondylolisthesis.    Examination-Activity Limitations  Toileting;Sit;Stand;Bend;Lift;Carry    Examination-Participation Restrictions  Interpersonal Relationship;Yard Work;Cleaning;Laundry    Stability/Clinical Decision Making  Unstable/Unpredictable    Rehab Potential  Good    PT Frequency  2x / week    PT Duration  Other (comment)   20   PT Treatment/Interventions  ADLs/Self Care Home Management;Aquatic Therapy;Moist Heat;Electrical Stimulation;Traction;Therapeutic activities;Functional mobility training;Stair training;Gait training;Therapeutic exercise;Balance training;Neuromuscular re-education;Patient/family education;Manual techniques;Dry needling;Passive range of motion;Scar mobilization;Taping    Consulted and Agree with Plan of Care  Patient       Patient will benefit from skilled therapeutic intervention in order to improve the following deficits and impairments:  Abnormal gait, Decreased balance, Increased muscle spasms, Decreased range of motion, Decreased scar mobility, Improper body mechanics, Decreased coordination, Decreased strength, Increased fascial restricitons, Impaired flexibility, Postural dysfunction, Pain  Visit Diagnosis: Chronic right shoulder pain  Abnormal posture  Other muscle spasm     Problem List Patient Active Problem List   Diagnosis  Date Noted  . Chronic UTI 12/06/2018  . Status post lumbar surgery 12/06/2018  . Spondylolisthesis, lumbar region 09/26/2018  . Aortic atherosclerosis ([place]) 08/31/2018  .  CAD (coronary artery disease) 02/23/2018  . Advanced care planning/counseling discussion 03/28/2017  . Varicose veins of both lower extremities with complications 76/81/1572  . Arthritis 12/09/2016  . Vaginal atrophy 09/10/2015  . Menopausal hot flushes 09/10/2015  . Onychomycosis due to dermatophyte 04/07/2015  . Bursitis of right shoulder 02/26/2015  . Environmental and seasonal allergies 02/26/2015  . Other allergic rhinitis 02/26/2015  . Osteoporosis 11/26/2014  . COPD (chronic obstructive pulmonary disease) ([place]) 11/26/2014  . Fuchs' corneal dystrophy 11/26/2014  . Benign hypertensive renal disease 11/26/2014  . Hyperlipidemia 11/26/2014  . Chronic kidney disease, stage III (moderate) ([place]) 11/26/2014  . Hypothyroidism 11/26/2014  . Endothelial corneal dystrophy 11/26/2014    Jerl Mina ,PT, DPT, E-RYT  08/21/2019, 10:57 AM  [place] [facility] SERVICES [address] [place], [place], [address] Phone: [phone]   Fax:  [phone]  Name: Julia Deleon MRN: [id number] Date of Birth: 08-Sep-1945

## 2019-08-23 ENCOUNTER — Other Ambulatory Visit: Payer: Self-pay | Admitting: Obstetrics & Gynecology

## 2019-08-23 DIAGNOSIS — N951 Menopausal and female climacteric states: Secondary | ICD-10-CM

## 2019-08-23 DIAGNOSIS — N952 Postmenopausal atrophic vaginitis: Secondary | ICD-10-CM

## 2019-08-24 ENCOUNTER — Ambulatory Visit: Payer: Medicare Other | Admitting: Physical Therapy

## 2019-08-24 ENCOUNTER — Other Ambulatory Visit: Payer: Self-pay

## 2019-08-24 DIAGNOSIS — G8929 Other chronic pain: Secondary | ICD-10-CM

## 2019-08-24 DIAGNOSIS — M25511 Pain in right shoulder: Secondary | ICD-10-CM | POA: Diagnosis not present

## 2019-08-24 DIAGNOSIS — R2689 Other abnormalities of gait and mobility: Secondary | ICD-10-CM | POA: Diagnosis not present

## 2019-08-24 DIAGNOSIS — M62838 Other muscle spasm: Secondary | ICD-10-CM

## 2019-08-24 DIAGNOSIS — R293 Abnormal posture: Secondary | ICD-10-CM

## 2019-08-24 NOTE — Therapy (Signed)
[place] MAIN [facility] SERVICES [address] [place], [place], [address] Phone: [phone]   Fax:  [phone]  Physical Therapy Treatment  Patient Details  Name: Julia Deleon MRN: [id number] Date of Birth: May 27, 1945 Referring Provider (PT): Dr. Maryan Puls   Encounter Date: 08/24/2019  PT End of Session - 08/24/19 1000    Visit Number  [id number]    Date for PT Re-Evaluation  09/11/19   Progress note to report #84 (last on #74 08/10/19)   Authorization Type  Medicare    PT Start Time  0902    PT Stop Time  1000    PT Time Calculation (min)  58 min    Activity Tolerance  Patient tolerated treatment well;No increased pain    Behavior During Therapy  WFL for tasks assessed/performed       Past Medical History:  Diagnosis Date  . Arthritis   . COPD (chronic obstructive pulmonary disease) ([place])   . Family history of adverse reaction to anesthesia    father was slow to wake up  . Fuch's endothelial dystrophy   . Hyperlipidemia   . Hypothyroidism   . PONV (postoperative nausea and vomiting)    slow to wake up and PONV    Past Surgical History:  Procedure Laterality Date  . APPENDECTOMY    . BACK SURGERY  09/26/2018   L4-5 PLIF by Dr. Arnoldo Morale  . CARDIAC CATHETERIZATION    . CHOLECYSTECTOMY    . EYE SURGERY    . FOOT SURGERY Right    pin removed left  . laser vein surgery    . NASAL SINUS SURGERY    . RIGHT/LEFT HEART CATH AND CORONARY ANGIOGRAPHY N/A 12/06/2017   Procedure: RIGHT/LEFT HEART CATH AND CORONARY ANGIOGRAPHY;  Surgeon: Yolonda Kida, MD;  Location: [place] CV LAB;  Service: Cardiovascular;  Laterality: N/A;  . TONSILLECTOMY      There were no vitals filed for this visit.  Subjective Assessment - 08/24/19 0912    Subjective  Pt reported she woke up the same way as previous nights after last session. Pt thinks the time she slept well after one session was because her shoulder was worked on and she does not think it  has to do with the stretches         [facility] PT Assessment - 08/24/19 0954      Palpation   Spinal mobility  standing: shoulder abduction ~120 deg B without compensation with psterior tilt of pelvis     Palpation comment  intercostal 5-6 anterior /lateral/ posterior on L side, and facets of T10-12 L      Ambulation/Gait   Gait Comments  less trunk lean, more anterior/ posterior shoulder rotation, lower thorax shift to L                    [facility] PT Treatment/Exercise - 08/24/19 0954      Therapeutic Activites    Other Therapeutic Activities  assessments       Manual Tx: STM/ MWM at  intercostal 5-6 anterior /lateral/ posterior on L side, and facets of T10-12 L          PT Short Term Goals - 06/08/19 1133      PT SHORT TERM GOAL #1   Title  Patient will demonstrate improved pelvic alignment and balance of musculature surrounding the pelvis to facilitate decreased PFM spasms and decrease pelvic pain.    Baseline  Pt. demonstrates  severe posterior pelvic tilt, L thoracic/R lumbar scoliosis, and R anterior/L posterior pelvic obliquity. Pt. has demonstrated improved pelvic alignment and posture but has not been able to sustain long-term due to continued lack of postural strength.    Time  5    Period  Weeks    Status  Achieved    Target Date  03/06/19      PT SHORT TERM GOAL #2   Title  Patient will demonstrate a coordinated contraction, relaxation, and bulge of the pelvic floor muscles to demonstrate functional recruitment and motion and allow for bladder emptying.    Baseline  Pt. is unable to fully empty bladder and describes pain with catheterization, demonstrating high tone of PFM.    Time  5    Period  Weeks    Status  Achieved    Target Date  03/06/19      PT SHORT TERM GOAL #3   Title  Patient will demonstrate improved sitting and standing posture to demonstrate learning and decrease stress on the pelvic floor with functional activity.    Baseline   Pt. demonstrates severe posterior pelvic tilt, L thoracic/R lumbar scoliosis, and R anterior/L posterior pelvic obliquity. 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 Will be able to cath without pain and will be cleared by physiscian to d/c self-cath due to decreased residual volume with bladder emptying.    Baseline  required to cath 3 times per day, painful each time.    Time  10    Period  Weeks    Status  Achieved      PT LONG TERM GOAL #4   Title  Patient will score less than or equal to 40% on the Female NIH-CPSI and 30% on the [facility] to demonstrate a reduction in pain, urinary symptoms, and an improved quality of life.    Baseline  Female NIH-CPSI: 38/43 (88%) , PDI: 45/75 (60%); PDI 44/75 (59%) and Female NIH-CPSI: 27/43 (64%) on 02-08-19 , Female NIH-CPSI: 17% on 03-26-19    Time  10    Period  Weeks    Status  Achieved  PT LONG TERM GOAL #5   Title  Patient will demonstrate increased step length with reciprical arm swing, and demonstrate no scissoring steps.    Baseline  Pt currently  demonstrated scissoring steppage on R LE, increased trunk rotation and deep core engagement with reciprical walking poles.    Time  10    Period  Weeks    Status  Achieved      PT LONG TERM GOAL #6   Title  Pt will increase her distance on the 6MWT from 747 feet  to  > 900 feet  order to progress with aerobic exercises and walking longer distances in the community    Time  8    Period  Weeks    Status  On-going      PT LONG TERM GOAL #7   Title  Pt will report waking up > 2 mornings in a row with pain level < 5-6/10 in order to demo maintanence of Tx benefits and more balance of mm tensions achieved with HEP, shoe lift to improve QOL    Baseline  7-8/10 level and benefits from Tx lasted only one morning  (03-19-19 and 03-23-19)    Time  10    Period  Weeks    Status  Partially Met      PT LONG TERM GOAL #8   Title  Pt will be able to demo increased PF strength with unilateral UE support on wall from B 3/5 15 reps in order to achieve stronger push off in gait and walk longer distance    Baseline  B, L 7 reps  2/5, 10 reps R 3/5  ( 12/17: 15 reps R , 13 reps L, 06/01/19: R 20 reps, L 18 reps  )    Time  10    Period  Weeks    Status  Achieved      PT LONG TERM GOAL  #9   TITLE  Pt will demo shoulder abduction > 120 deg B with improved alignment of thorax over pelvis without manual cues in order to demo proper co-activation of B equal weight bearing in BLE, and decreased thoracic kyhposis/ decreased thoracic rotation 2/2 scoliosis in order to lift plates overhead cabinets/ decreased risk for    Time  10    Period  Weeks    Status  Achieved      PT LONG TERM GOAL  #10   TITLE  Pt will demo increased height from 63" to > 63 1/2 " and maintain 63 1/2" across 3 weeks ( once a week measurement) and forward head posture improve ( decreased distance from wall to earlobe 17 cm from earlobe to wall to > 12 cm)  in order to be ready to progress to PNF exercise with less shoulder pain and lengthen/  strengthen concave curve on R 2/2 scoliosis to minimize L lateral thigh tightness  (07/06/19 : 63 3/4",  08/10/19: 64"   )    Time  8    Period  Weeks    Status  Achieved      PT LONG TERM GOAL  #11   TITLE  Pt will demo single UE support with lunge position exercises/ yoga poses  in order to demo improved balance and IND with flexibility routine    Baseline  BUE support    Time  10    Period  Weeks    Status  On-going      PT LONG TERM GOAL  #12  TITLE  Pt will demo L trunk rotation in R sidelying with zero report of pain in order to progress with longer walking endurance    Time  10    Period  Weeks    Status  New      PT LONG TERM GOAL  #13   TITLE  Pt will demo increased  shoulder abd R against wall in mini squat from 55 deg to > 75 deg in order to demo increased thoracic extension and intercostal lengthening for functional BUE mobility without compensations.  ( 4/2: 88 deg R )    Time  8    Period  Weeks    Status  Achieved            Plan - 08/24/19 1000    Clinical Impression Statement  Pt demonstrates improved shoulder abduction ROM ( B 120 deg )  without compensatory strategy which indicates more spinal extension and less collapse in lumbar spine 2/2 scoliosis. Continued to address L intercostal mm tightness and rotation to adjust for rotational component to scoliosis. Plan to add QL / lumbar strengthening exercise to promote more medial shift of L thorax over pelvis. Pt continues to benefit from skilled PT    Personal Factors and Comorbidities  Age;Comorbidity 3+    Comorbidities  Osteoporosis, scoliosis, Arthritis, COPD, recent lumbosacral fusion for spondylolisthesis.    Examination-Activity Limitations  Toileting;Sit;Stand;Bend;Lift;Carry    Examination-Participation Restrictions  Interpersonal Relationship;Yard Work;Cleaning;Laundry    Stability/Clinical Decision Making  Unstable/Unpredictable    Rehab Potential  Good    PT Frequency  2x / week    PT Duration   Other (comment)   20   PT Treatment/Interventions  ADLs/Self Care Home Management;Aquatic Therapy;Moist Heat;Electrical Stimulation;Traction;Therapeutic activities;Functional mobility training;Stair training;Gait training;Therapeutic exercise;Balance training;Neuromuscular re-education;Patient/family education;Manual techniques;Dry needling;Passive range of motion;Scar mobilization;Taping    Consulted and Agree with Plan of Care  Patient       Patient will benefit from skilled therapeutic intervention in order to improve the following deficits and impairments:  Abnormal gait, Decreased balance, Increased muscle spasms, Decreased range of motion, Decreased scar mobility, Improper body mechanics, Decreased coordination, Decreased strength, Increased fascial restricitons, Impaired flexibility, Postural dysfunction, Pain  Visit Diagnosis: Abnormal posture  Chronic right shoulder pain  Other muscle spasm     Problem List Patient Active Problem List   Diagnosis Date Noted  . Chronic UTI 12/06/2018  . Status post lumbar surgery 12/06/2018  . Spondylolisthesis, lumbar region 09/26/2018  . Aortic atherosclerosis ([place]) 08/31/2018  . CAD (coronary artery disease) 02/23/2018  . Advanced care planning/counseling discussion 03/28/2017  . Varicose veins of both lower extremities with complications 79/06/4095  . Arthritis 12/09/2016  . Vaginal atrophy 09/10/2015  . Menopausal hot flushes 09/10/2015  . Onychomycosis due to dermatophyte 04/07/2015  . Bursitis of right shoulder 02/26/2015  . Environmental and seasonal allergies 02/26/2015  . Other allergic rhinitis 02/26/2015  . Osteoporosis 11/26/2014  . COPD (chronic obstructive pulmonary disease) ([place]) 11/26/2014  . Fuchs' corneal dystrophy 11/26/2014  . Benign hypertensive renal disease 11/26/2014  . Hyperlipidemia 11/26/2014  . Chronic kidney disease, stage III (moderate) ([place]) 11/26/2014  . Hypothyroidism 11/26/2014  . Endothelial  corneal dystrophy 11/26/2014    Jerl Mina ,PT, DPT, E-RYT  08/24/2019, 12:43 PM  [place] MAIN [facility] SERVICES [address] [place], [place], [address] Phone: [phone]   Fax:  [phone]  Name: Diora C Vanwagoner MRN: [id number] Date of Birth: Oct 04, 1945

## 2019-08-28 ENCOUNTER — Ambulatory Visit: Payer: Medicare Other | Admitting: Physical Therapy

## 2019-08-28 ENCOUNTER — Other Ambulatory Visit: Payer: Self-pay

## 2019-08-28 DIAGNOSIS — M62838 Other muscle spasm: Secondary | ICD-10-CM | POA: Diagnosis not present

## 2019-08-28 DIAGNOSIS — R293 Abnormal posture: Secondary | ICD-10-CM

## 2019-08-28 DIAGNOSIS — M25511 Pain in right shoulder: Secondary | ICD-10-CM | POA: Diagnosis not present

## 2019-08-28 DIAGNOSIS — G8929 Other chronic pain: Secondary | ICD-10-CM | POA: Diagnosis not present

## 2019-08-28 DIAGNOSIS — R2689 Other abnormalities of gait and mobility: Secondary | ICD-10-CM | POA: Diagnosis not present

## 2019-08-28 NOTE — Patient Instructions (Addendum)
Buy bookbag to wear instead your purse to minimize weighing down one side of your spine  __   Lengthening R ribs and strengthening L ribs by armpit   -seated,  Push into feet Strap under R butt, hold strap over head like a rainbow while L hand pushes downward into plinth and elbow points back towards back ribs to squeeze armpit , shoulders blade together   DO NOT LEAN AT LOW back to the L to avoid shooting down pain  The focus is at the level of your ribs by armpit   10 reps   ___  Arm swings with walking

## 2019-08-28 NOTE — Therapy (Signed)
[place] MAIN [facility] SERVICES [address] [place], [place], [address] Phone: [phone]   Fax:  [phone]  Physical Therapy Treatment  Patient Details  Name: Julia Deleon MRN: [id number] Date of Birth: Deya 21, 1947 Referring Provider (PT): Dr. Maryan Puls   Encounter Date: 08/28/2019  PT End of Session - 08/28/19 1123    Visit Number  [id number]    Date for PT Re-Evaluation  09/11/19   Progress note to report #84 (last on #74 08/10/19)   Authorization Type  Medicare    PT Start Time  1003    PT Stop Time  1123    PT Time Calculation (min)  80 min    Activity Tolerance  Patient tolerated treatment well;No increased pain    Behavior During Therapy  WFL for tasks assessed/performed       Past Medical History:  Diagnosis Date  . Arthritis   . COPD (chronic obstructive pulmonary disease) ([place])   . Family history of adverse reaction to anesthesia    father was slow to wake up  . Fuch's endothelial dystrophy   . Hyperlipidemia   . Hypothyroidism   . PONV (postoperative nausea and vomiting)    slow to wake up and PONV    Past Surgical History:  Procedure Laterality Date  . APPENDECTOMY    . BACK SURGERY  09/26/2018   L4-5 PLIF by Dr. Arnoldo Morale  . CARDIAC CATHETERIZATION    . CHOLECYSTECTOMY    . EYE SURGERY    . FOOT SURGERY Right    pin removed left  . laser vein surgery    . NASAL SINUS SURGERY    . RIGHT/LEFT HEART CATH AND CORONARY ANGIOGRAPHY N/A 12/06/2017   Procedure: RIGHT/LEFT HEART CATH AND CORONARY ANGIOGRAPHY;  Surgeon: Yolonda Kida, MD;  Location: [place] [facility];  Service: Cardiovascular;  Laterality: N/A;  . TONSILLECTOMY      There were no vitals filed for this visit.  Subjective Assessment - 08/28/19 1021    Subjective  Pt reported she had no increased tightness  from last week's exercises.         [facility] PT Assessment - 08/28/19 1022      Posture/Postural Control   Posture Comments  standing/ CKC L UE  by wall, R lateral shift exercise when done w/ less awareness of isolating thorax and more pelvic shift involved, pt reported R radiating pain which subsisaided quickly with R trunk stretch to open facet joints. Seated postural stability exercise with R shoulder abduction 120 no pain with CKC (holding belt to length R flank while L LE  on plinth to stabilize scapula and create R lateral shift of thorax 10 reps no pain.  Seated upright with manual Tx along rib at paraspinals L thoracic, caused referred pain to neck. Referred pain to neck was reproduced in supine at T10        Palpation   Spinal mobility  limited mobility at T10 along paraspinals L       Ambulation/Gait   Gait Comments  L lateral shift of thorax, slight R trunk lean from  thoracic not from lumbar , scissoring with her insoles.  more instabilty and wavering without her insoles.  Limited thoracic rotation and armswings .  manual support to lengthen intercostal 7-10, pt demo'd more arm sing without excessive rotation of lumbar spine                    [facility] Adult PT  Treatment/Exercise - 08/28/19 1022      Therapeutic Activites    Other Therapeutic Activities  explained the area of focus related to scoliosis and reason for radiating pain,  cued for arms swings and less lean at low back with gait.       Neuro Re-ed    Neuro Re-ed Details   cued for new postural stability exercises for more thoracic shift to lengthen R, strength L T7-10 region, cued for more arm swing, isolated rotation at throacic rings and less at lumbar to minimize radiating pain down thighs, cued for armswings/ thoracic ring rotations        Moist Heat Therapy   Number Minutes Moist Heat  10 Minutes    Moist Heat Location  --   shoulders     Manual Therapy   Manual therapy comments   lengthening at T10 intercostal L, manual faciliation at T7-10 with gait training                PT Short Term Goals - 06/08/19 1133      PT SHORT TERM GOAL  #1   Title  Patient will demonstrate improved pelvic alignment and balance of musculature surrounding the pelvis to facilitate decreased PFM spasms and decrease pelvic pain.    Baseline  Pt. demonstrates severe posterior pelvic tilt, L thoracic/R lumbar scoliosis, and R anterior/L posterior pelvic obliquity. Pt. has demonstrated improved pelvic alignment and posture but has not been able to sustain long-term due to continued lack of postural strength.    Time  5    Period  Weeks    Status  Achieved    Target Date  03/06/19      PT SHORT TERM GOAL #2   Title  Patient will demonstrate a coordinated contraction, relaxation, and bulge of the pelvic floor muscles to demonstrate functional recruitment and motion and allow for bladder emptying.    Baseline  Pt. is unable to fully empty bladder and describes pain with catheterization, demonstrating high tone of PFM.    Time  5    Period  Weeks    Status  Achieved    Target Date  03/06/19      PT SHORT TERM GOAL #3   Title  Patient will demonstrate improved sitting and standing posture to demonstrate learning and decrease stress on the pelvic floor with functional activity.    Baseline  Pt. demonstrates severe posterior pelvic tilt, L thoracic/R lumbar scoliosis, and R anterior/L posterior pelvic obliquity. 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 Will be able to cath without pain and will be cleared by physiscian to d/c self-cath due to decreased residual volume with bladder emptying.    Baseline  required to cath 3 times per day, painful each time.    Time  10    Period  Weeks    Status  Achieved      PT LONG TERM GOAL #4   Title  Patient will score less than or equal to 40% on the Female NIH-CPSI and 30% on the [facility] to demonstrate a reduction in pain, urinary symptoms, and an improved quality of life.    Baseline  Female NIH-CPSI: 38/43 (88%) , PDI: 45/75 (60%); PDI 44/75 (59%) and Female NIH-CPSI: 27/43 (64%) on 02-08-19 , Female NIH-CPSI: 17% on 03-26-19    Time  10    Period  Weeks    Status  Achieved      PT LONG TERM GOAL #5   Title  Patient will demonstrate increased step length with reciprical arm swing, and demonstrate no scissoring steps.    Baseline  Pt currently demonstrated scissoring steppage on R LE, increased trunk rotation and deep core engagement with reciprical walking poles.    Time  10    Period  Weeks    Status  Achieved      PT LONG TERM GOAL #6   Title  Pt will increase her distance on the 6MWT from 747 feet  to  > 900 feet  order to progress with aerobic exercises and walking longer distances in the community    Time  8    Period  Weeks    Status  On-going      PT LONG TERM GOAL #7   Title  Pt will report waking up > 2 mornings in a row with pain level < 5-6/10 in order to demo maintanence of Tx benefits and more balance of mm tensions achieved with HEP, shoe lift to improve QOL    Baseline  7-8/10 level and benefits from Tx lasted only one morning  (03-19-19 and 03-23-19)    Time  10    Period  Weeks     Status  Partially Met      PT LONG TERM GOAL #8   Title  Pt will be able to demo increased PF strength with unilateral UE support on wall from B 3/5 15 reps in order to achieve stronger push off in gait and walk longer distance    Baseline  B, L 7 reps  2/5, 10 reps R 3/5  ( 12/17: 15 reps R , 13 reps L, 06/01/19: R 20 reps, L 18 reps  )    Time  10    Period  Weeks    Status  Achieved      PT LONG TERM GOAL  #9   TITLE  Pt will demo shoulder abduction > 120 deg B with improved alignment of thorax over pelvis without manual cues in order to demo proper co-activation of B equal weight bearing in BLE, and decreased thoracic kyhposis/ decreased thoracic rotation 2/2 scoliosis  in order to lift plates overhead cabinets/ decreased risk for    Time  10    Period  Weeks    Status  Achieved      PT LONG TERM GOAL  #10   TITLE  Pt will demo increased height from 63" to > 63 1/2 " and maintain 63 1/2" across 3 weeks ( once a week measurement) and forward head posture improve ( decreased distance from wall to earlobe 17 cm from earlobe to wall to > 12 cm)  in order to be ready to progress to PNF exercise with less shoulder pain and lengthen/ strengthen concave curve on R 2/2 scoliosis to minimize L lateral thigh tightness  (07/06/19 : 63 3/4",  08/10/19: 64"   )    Time  8    Period  Weeks    Status  Achieved      PT LONG TERM GOAL  #11   TITLE  Pt will demo single UE support with lunge position exercises/ yoga poses  in order to demo improved balance and IND with flexibility routine    Baseline  BUE support    Time  10    Period  Weeks    Status  On-going      PT LONG TERM GOAL  #12   TITLE  Pt will demo L trunk rotation in R sidelying with zero report of pain in order to progress with longer walking endurance    Time  10    Period  Weeks    Status  New      PT LONG TERM GOAL  #13   TITLE  Pt will demo increased  shoulder abd R against wall in mini squat from 55 deg to > 75 deg in order to  demo increased thoracic extension and intercostal lengthening for functional BUE mobility without compensations.  ( 4/2: 88 deg R )    Time  8    Period  Weeks    Status  Achieved            Plan - 08/28/19 1125    Clinical Impression Statement  Pt was assessed today on gait pattern with and without her customized insoles which she has worn for many years. Although she demo'd scissoring minimally, it will be best for pt to keep wearing them because pt demo'd more instability when not wearing them.   Gait deviations currently now has improved with more pelvic mobility and more weigh bearing, ROM at ankle, knee, and hip extension which have been addressed over the past sessions. Pt will need more Tx to increase thoracic rotation, arm swing with dissociation patterns to not place excessive pressure onto lumbar segments 2/2 scoliosis. Manual Tx was applied to optimize intercostals 7-10 on L where L convex curve is present. Palpation along paraspinal mm at T10 caused referred neck pain. Tx helped to decrease this pain.   Pt was able to tolerate gait assessment and seated new HEP which demonstrates increased postural mm endurance. Pt was able to achieve shoulder abduction at 120 deg on R while stabilizing L scapula, creating R lateral shift of thorax with L UE closed kinetic chain for this new exercise. In the past when pt had significant thoracic kyphosis and rounded shoulders, pt was not able to demo shoulder abduction and overhead exercises.  Manual assistance was applied in gait to promote more lengthening of intercostals and thoracic rotation at thoracic levels and pt tolerated this assistance. PLan to continue addressing lengthening  of intercostal spaces and small thoracic rotations to account for scoliotic curves and to minimize shortening that occurs to R T/L junction. Anticipate that by creating more R lateral shift of thorax over pelvis, scoliotic curvatures will be minimized and the reciporcal  movement of thorax and pelvis will be more restored.  Pt continues to benefit from skilled PT     Personal Factors and Comorbidities  Age;Comorbidity 3+    Comorbidities  Osteoporosis, scoliosis, Arthritis, COPD, recent lumbosacral fusion for spondylolisthesis.    Examination-Activity Limitations  Toileting;Sit;Stand;Bend;Lift;Carry    Examination-Participation Restrictions  Interpersonal Relationship;Yard Work;Cleaning;Laundry    Stability/Clinical Decision Making  Unstable/Unpredictable    Rehab Potential  Good    PT Frequency  2x / week    PT Duration  Other (comment)   20   PT Treatment/Interventions  ADLs/Self Care Home Management;Aquatic Therapy;Moist Heat;Electrical Stimulation;Traction;Therapeutic activities;Functional mobility training;Stair training;Gait training;Therapeutic exercise;Balance training;Neuromuscular re-education;Patient/family education;Manual techniques;Dry needling;Passive range of motion;Scar mobilization;Taping    Consulted and Agree with Plan of Care  Patient       Patient will benefit from skilled therapeutic intervention in order to improve the following deficits and impairments:  Abnormal gait, Decreased balance, Increased muscle spasms, Decreased range of motion, Decreased scar mobility, Improper body mechanics, Decreased coordination, Decreased strength, Increased fascial restricitons, Impaired flexibility, Postural dysfunction, Pain  Visit Diagnosis: Abnormal posture  Chronic right shoulder pain  Other muscle spasm     Problem List Patient Active Problem List   Diagnosis Date Noted  . Chronic UTI 12/06/2018  . Status post lumbar surgery 12/06/2018  . Spondylolisthesis, lumbar region 09/26/2018  . Aortic atherosclerosis ([place]) 08/31/2018  . CAD (coronary artery disease) 02/23/2018  . Advanced care planning/counseling discussion 03/28/2017  . Varicose veins of both lower extremities with complications 14/64/3142  . Arthritis 12/09/2016  .  Vaginal atrophy 09/10/2015  . Menopausal hot flushes 09/10/2015  . Onychomycosis due to dermatophyte 04/07/2015  . Bursitis of right shoulder 02/26/2015  . Environmental and seasonal allergies 02/26/2015  . Other allergic rhinitis 02/26/2015  . Osteoporosis 11/26/2014  . COPD (chronic obstructive pulmonary disease) ([place]) 11/26/2014  . Fuchs' corneal dystrophy 11/26/2014  . Benign hypertensive renal disease 11/26/2014  . Hyperlipidemia 11/26/2014  . Chronic kidney disease, stage III (moderate) ([place]) 11/26/2014  . Hypothyroidism 11/26/2014  . Endothelial corneal dystrophy 11/26/2014    Jerl Mina ,PT, DPT, E-RYT  08/28/2019, 12:45 PM  [place] MAIN [facility] SERVICES [address] [place], [place], [address] Phone: [phone]   Fax:  [phone]  Name: Julia Deleon MRN: [id number] Date of Birth: Oct 31, 1945

## 2019-08-31 ENCOUNTER — Other Ambulatory Visit: Payer: Self-pay

## 2019-08-31 ENCOUNTER — Ambulatory Visit: Payer: Medicare Other | Admitting: Physical Therapy

## 2019-08-31 DIAGNOSIS — G8929 Other chronic pain: Secondary | ICD-10-CM | POA: Diagnosis not present

## 2019-08-31 DIAGNOSIS — R293 Abnormal posture: Secondary | ICD-10-CM | POA: Diagnosis not present

## 2019-08-31 DIAGNOSIS — M25511 Pain in right shoulder: Secondary | ICD-10-CM | POA: Diagnosis not present

## 2019-08-31 DIAGNOSIS — M62838 Other muscle spasm: Secondary | ICD-10-CM

## 2019-08-31 DIAGNOSIS — R2689 Other abnormalities of gait and mobility: Secondary | ICD-10-CM | POA: Diagnosis not present

## 2019-08-31 NOTE — Patient Instructions (Signed)
Press both feet into floor, Lean slightly forward Center of mass not back  To lengthen tall

## 2019-08-31 NOTE — Therapy (Signed)
[place] MAIN [facility] SERVICES [address] [place], [place], [address] Phone: [phone]   Fax:  [phone]  Physical Therapy Treatment  Patient Details  Name: Julia Deleon MRN: [id number] Date of Birth: 03/30/1946 Referring Provider (PT): Dr. Maryan Puls   Encounter Date: 08/31/2019    Past Medical History:  Diagnosis Date  . Arthritis   . COPD (chronic obstructive pulmonary disease) ([place])   . Family history of adverse reaction to anesthesia    father was slow to wake up  . Fuch's endothelial dystrophy   . Hyperlipidemia   . Hypothyroidism   . PONV (postoperative nausea and vomiting)    slow to wake up and PONV    Past Surgical History:  Procedure Laterality Date  . APPENDECTOMY    . BACK SURGERY  09/26/2018   L4-5 PLIF by Dr. Arnoldo Morale  . CARDIAC CATHETERIZATION    . CHOLECYSTECTOMY    . EYE SURGERY    . FOOT SURGERY Right    pin removed left  . laser vein surgery    . NASAL SINUS SURGERY    . RIGHT/LEFT HEART CATH AND CORONARY ANGIOGRAPHY N/A 12/06/2017   Procedure: RIGHT/LEFT HEART CATH AND CORONARY ANGIOGRAPHY;  Surgeon: Yolonda Kida, MD;  Location: [facility];  Service: Cardiovascular;  Laterality: N/A;  . TONSILLECTOMY      There were no vitals filed for this visit.  Subjective Assessment - 08/31/19 0909    Subjective  Pt reported no increased pain after last session. Pt is not wiling to wear a bookbag purse but will be willing to use a small and lighter purse.         [facility] PT Assessment - 08/31/19 0917      Posture/Postural Control   Posture Comments  poor weightbearing at lower kinetic chain in seated posture,       AROM   Overall AROM Comments  seated position w/ cues for more anteriior COM and lower kinetic chain activation: shoulder abduction L 75 deg, 110 deg  R post Tx.  wall sqaut by wall: 75 deg AROM cued for cerv retraction ( head not touching wall, L rotation anteriorly          Palpation   Spinal mobility  palpation at T10 intercostal reproduced radiating pain to L lateral knee ( T10 is the apex of convex curve in thoracic).  referred pain to neck with supine position/ cervical ext. Required cerv retraction to minimize neck pain                     [facility] Adult PT Treatment/Exercise - 08/31/19 5916      Neuro Re-ed    Neuro Re-ed Details   cued for exhalation with depression of ribs, cued for weight bearing in lower kientic chain and anterior COM with HEP , propioception training with COM       Manual Therapy   Manual therapy comments  lengthening at T4-5 B in supine with coordination exhalation                PT Short Term Goals - 06/08/19 1133      PT SHORT TERM GOAL #1   Title  Patient will demonstrate improved pelvic alignment and balance of musculature surrounding the pelvis to facilitate decreased PFM spasms and decrease pelvic pain.    Baseline  Pt. demonstrates severe posterior pelvic tilt, L thoracic/R lumbar scoliosis, and R anterior/L posterior pelvic obliquity. Pt. has  demonstrated improved pelvic alignment and posture but has not been able to sustain long-term due to continued lack of postural strength.    Time  5    Period  Weeks    Status  Achieved    Target Date  03/06/19      PT SHORT TERM GOAL #2   Title  Patient will demonstrate a coordinated contraction, relaxation, and bulge of the pelvic floor muscles to demonstrate functional recruitment and motion and allow for bladder emptying.    Baseline  Pt. is unable to fully empty bladder and describes pain with catheterization, demonstrating high tone of PFM.    Time  5    Period  Weeks    Status  Achieved    Target Date  03/06/19      PT SHORT TERM GOAL #3   Title  Patient will demonstrate improved sitting and standing posture to demonstrate learning and decrease stress on the pelvic floor with functional activity.    Baseline  Pt. demonstrates severe posterior pelvic  tilt, L thoracic/R lumbar scoliosis, and R anterior/L posterior pelvic obliquity. 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 Will be able to cath without pain and will be cleared by physiscian to d/c self-cath due to decreased residual volume with bladder emptying.    Baseline  required to cath 3 times per day, painful each time.    Time  10    Period  Weeks    Status  Achieved      PT LONG TERM GOAL #4   Title  Patient will score less than or equal to 40% on the Female NIH-CPSI and 30% on the [facility] to demonstrate a reduction in pain, urinary symptoms, and an improved quality of life.    Baseline  Female NIH-CPSI: 38/43 (88%) , PDI: 45/75 (60%); PDI 44/75 (59%) and Female NIH-CPSI: 27/43 (64%) on 02-08-19 , Female NIH-CPSI: 17% on 03-26-19    Time  10    Period  Weeks    Status  Achieved      PT LONG TERM GOAL #5   Title  Patient will demonstrate increased step length  with reciprical arm swing, and demonstrate no scissoring steps.    Baseline  Pt currently demonstrated scissoring steppage on R  LE, increased trunk rotation and deep core engagement with reciprical walking poles.    Time  10    Period  Weeks    Status  Achieved      PT LONG TERM GOAL #6   Title  Pt will increase her distance on the 6MWT from 747 feet  to  > 900 feet  order to progress with aerobic exercises and walking longer distances in the community    Time  8    Period  Weeks    Status  On-going      PT LONG TERM GOAL #7   Title  Pt will report waking up > 2 mornings in a row with pain level < 5-6/10 in order to demo maintanence of Tx benefits and more balance of mm tensions achieved with HEP, shoe lift to improve QOL    Baseline  7-8/10 level and benefits from Tx lasted only one morning  (03-19-19 and 03-23-19)    Time  10    Period  Weeks    Status  Partially Met      PT LONG TERM GOAL #8   Title  Pt will be able to demo increased PF strength with unilateral UE support on wall from B 3/5 15 reps in order to achieve stronger push off in gait and walk longer distance    Baseline  B, L 7 reps  2/5, 10 reps R 3/5  ( 12/17: 15 reps R , 13 reps L, 06/01/19: R 20 reps, L 18 reps  )    Time  10    Period  Weeks    Status  Achieved      PT LONG TERM GOAL  #9   TITLE  Pt will demo shoulder abduction > 120 deg B with improved alignment of thorax over pelvis without manual cues in order to demo proper co-activation of B equal weight bearing in BLE, and decreased thoracic kyhposis/ decreased thoracic rotation 2/2 scoliosis in order to lift plates overhead cabinets/ decreased risk for    Time  10    Period  Weeks    Status  Achieved      PT LONG TERM GOAL  #10   TITLE  Pt will demo increased height from 63" to > 63 1/2 " and maintain 63 1/2" across 3 weeks ( once a week measurement) and forward head posture improve ( decreased distance from wall to earlobe 17 cm from earlobe to wall to > 12 cm)  in order to be ready to progress to PNF exercise with less shoulder pain and lengthen/ strengthen concave curve on R 2/2  scoliosis to minimize L lateral thigh tightness  (07/06/19 : 63 3/4",  08/10/19: 64"   )    Time  8    Period  Weeks    Status  Achieved      PT LONG TERM GOAL  #11   TITLE  Pt will demo single UE support with lunge position exercises/ yoga poses  in order to demo improved balance and IND with flexibility routine    Baseline  BUE support    Time  10    Period  Weeks    Status  On-going      PT LONG TERM GOAL  #12   TITLE  Pt will demo L trunk rotation in R sidelying with zero report  of pain in order to progress with longer walking endurance    Time  10    Period  Weeks    Status  New      PT LONG TERM GOAL  #13   TITLE  Pt will demo increased  shoulder abd R against wall in mini squat from 55 deg to > 75 deg in order to demo increased thoracic extension and intercostal lengthening for functional BUE mobility without compensations.  ( 4/2: 88 deg R )    Time  8    Period  Weeks    Status  Achieved            Plan - 08/31/19 1505    Clinical Impression Statement  Pt    Personal Factors and Comorbidities  Age;Comorbidity 3+    Comorbidities  Osteoporosis, scoliosis, Arthritis, COPD, recent lumbosacral fusion for spondylolisthesis.    Examination-Activity Limitations  Toileting;Sit;Stand;Bend;Lift;Carry    Examination-Participation Restrictions  Interpersonal Relationship;Yard Work;Cleaning;Laundry    Stability/Clinical Decision Making  Unstable/Unpredictable    Rehab Potential  Good    PT Frequency  2x / week    PT Duration  Other (comment)   20   PT Treatment/Interventions  ADLs/Self Care Home Management;Aquatic Therapy;Moist Heat;Electrical Stimulation;Traction;Therapeutic activities;Functional mobility training;Stair training;Gait training;Therapeutic exercise;Balance training;Neuromuscular re-education;Patient/family education;Manual techniques;Dry needling;Passive range of motion;Scar mobilization;Taping    Consulted and Agree with Plan of Care  Patient       Patient  will benefit from skilled therapeutic intervention in order to improve the following deficits and impairments:  Abnormal gait, Decreased balance, Increased muscle spasms, Decreased range of motion, Decreased scar mobility, Improper body mechanics, Decreased coordination, Decreased strength, Increased fascial restricitons, Impaired flexibility, Postural dysfunction, Pain  Visit Diagnosis: Abnormal posture  Chronic right shoulder pain  Other muscle spasm     Problem List Patient Active Problem List   Diagnosis Date Noted  . Chronic UTI 12/06/2018  . Status post lumbar surgery 12/06/2018  . Spondylolisthesis, lumbar region 09/26/2018  . Aortic atherosclerosis ([place]) 08/31/2018  . CAD (coronary artery disease) 02/23/2018  . Advanced care planning/counseling discussion 03/28/2017  . Varicose veins of both lower extremities with complications 16/05/930  . Arthritis 12/09/2016  . Vaginal atrophy 09/10/2015  . Menopausal hot flushes 09/10/2015  . Onychomycosis due to dermatophyte 04/07/2015  . Bursitis of right shoulder 02/26/2015  . Environmental and seasonal allergies 02/26/2015  . Other allergic rhinitis 02/26/2015  . Osteoporosis 11/26/2014  . COPD (chronic obstructive pulmonary disease) ([place]) 11/26/2014  . Fuchs' corneal dystrophy 11/26/2014  . Benign hypertensive renal disease 11/26/2014  . Hyperlipidemia 11/26/2014  . Chronic kidney disease, stage III (moderate) ([place]) 11/26/2014  . Hypothyroidism 11/26/2014  . Endothelial corneal dystrophy 11/26/2014    Jerl Mina ,PT, DPT, E-RYT  08/31/2019, 3:07 PM  [place] MAIN [facility] SERVICES [address] [place], [place], [address] Phone: [phone]   Fax:  [phone]  Name: Julia Deleon MRN: [id number] Date of Birth: 1945/11/01

## 2019-09-03 ENCOUNTER — Ambulatory Visit: Payer: Medicare Other | Admitting: Physical Therapy

## 2019-09-03 ENCOUNTER — Other Ambulatory Visit: Payer: Self-pay

## 2019-09-03 DIAGNOSIS — R293 Abnormal posture: Secondary | ICD-10-CM

## 2019-09-03 DIAGNOSIS — M62838 Other muscle spasm: Secondary | ICD-10-CM

## 2019-09-03 DIAGNOSIS — G8929 Other chronic pain: Secondary | ICD-10-CM

## 2019-09-03 DIAGNOSIS — M25511 Pain in right shoulder: Secondary | ICD-10-CM | POA: Diagnosis not present

## 2019-09-03 DIAGNOSIS — R2689 Other abnormalities of gait and mobility: Secondary | ICD-10-CM | POA: Diagnosis not present

## 2019-09-03 NOTE — Therapy (Signed)
[place] MAIN [facility] SERVICES [address] [place], [place], [address] Phone: [phone]   Fax:  [phone]  Physical Therapy Treatment  Patient Details  Name: Julia Deleon MRN: [id number] Date of Birth: [birth year] Referring Provider (PT): Dr. Maryan Puls   Encounter Date: 09/03/2019  PT End of Session - 09/03/19 1058    Visit Number  [id number]    Date for PT Re-Evaluation  09/11/19   Progress note to report #84 (last on #74 08/10/19)   Authorization Type  Medicare    PT Start Time  1003    PT Stop Time  1058    PT Time Calculation (min)  55 min    Activity Tolerance  Patient tolerated treatment well;No increased pain    Behavior During Therapy  WFL for tasks assessed/performed       Past Medical History:  Diagnosis Date  . Arthritis   . COPD (chronic obstructive pulmonary disease) ([place])   . Family history of adverse reaction to anesthesia    father was slow to wake up  . Fuch's endothelial dystrophy   . Hyperlipidemia   . Hypothyroidism   . PONV (postoperative nausea and vomiting)    slow to wake up and PONV    Past Surgical History:  Procedure Laterality Date  . APPENDECTOMY    . BACK SURGERY  09/26/2018   L4-5 PLIF by Dr. Arnoldo Morale  . CARDIAC CATHETERIZATION    . CHOLECYSTECTOMY    . EYE SURGERY    . FOOT SURGERY Right    pin removed left  . laser vein surgery    . NASAL SINUS SURGERY    . RIGHT/LEFT HEART CATH AND CORONARY ANGIOGRAPHY N/A 12/06/2017   Procedure: RIGHT/LEFT HEART CATH AND CORONARY ANGIOGRAPHY;  Surgeon: Yolonda Kida, MD;  Location: [facility] [facility];  Service: Cardiovascular;  Laterality: N/A;  . TONSILLECTOMY      There were no vitals filed for this visit.  Subjective Assessment - 09/03/19 1008    Subjective  Pt reported she felt like the usual after last session.         [facility] PT Assessment - 09/03/19 1011      Posture/Postural Control   Posture Comments  16 cm from ear lobe to wall       AROM   Overall AROM Comments  seated position L shoulder abduction 115 deg B, wall squat with shoulder abduction 75 deg B        Palpation   Spinal mobility  tightness of L levator scapulae,  supraspinatus, interspinals, rhomboids, intercostals 3-7.                      [facility] Adult PT Treatment/Exercise - 09/03/19 1011      Therapeutic Activites    Other Therapeutic Activities  reassessments       Manual Therapy   Manual therapy comments  STM/MW   L levator scapulae,  supraspinatus, interspinals, rhomboids, intercostals 3-7.                  PT Short Term Goals - 06/08/19 1133      PT SHORT TERM GOAL #1   Title  Patient will demonstrate improved pelvic alignment and balance of musculature surrounding the pelvis to facilitate decreased PFM spasms and decrease pelvic pain.    Baseline  Pt. demonstrates severe posterior pelvic tilt, L thoracic/R lumbar scoliosis, and R anterior/L posterior pelvic obliquity. Pt. has demonstrated improved pelvic  alignment and posture but has not been able to sustain long-term due to continued lack of postural strength.    Time  5    Period  Weeks    Status  Achieved    Target Date  03/06/19      PT SHORT TERM GOAL #2   Title  Patient will demonstrate a coordinated contraction, relaxation, and bulge of the pelvic floor muscles to demonstrate functional recruitment and motion and allow for bladder emptying.    Baseline  Pt. is unable to fully empty bladder and describes pain with catheterization, demonstrating high tone of PFM.    Time  5    Period  Weeks    Status  Achieved    Target Date  03/06/19      PT SHORT TERM GOAL #3   Title  Patient will demonstrate improved sitting and standing posture to demonstrate learning and decrease stress on the pelvic floor with functional activity.    Baseline  Pt. demonstrates severe posterior pelvic tilt, L thoracic/R lumbar scoliosis, and R anterior/L posterior pelvic obliquity. 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 Will be able to cath without pain and will be cleared by physiscian to d/c self-cath due to decreased residual volume with bladder emptying.    Baseline  required to cath 3 times per day, painful each time.    Time  10    Period  Weeks    Status  Achieved      PT LONG TERM GOAL #4   Title  Patient will score less than or equal to 40% on the Female NIH-CPSI and 30% on the [facility] to demonstrate a reduction in pain, urinary symptoms, and an improved quality of life.    Baseline  Female NIH-CPSI: 38/43 (88%) , PDI: 45/75 (60%); PDI 44/75 (59%) and Female NIH-CPSI: 27/43 (64%) on 02-08-19 , Female NIH-CPSI: 17% on 03-26-19    Time  10    Period  Weeks    Status  Achieved      PT LONG TERM GOAL #5   Title  Patient will demonstrate increased step length with reciprical arm  swing, and demonstrate no scissoring steps.    Baseline  Pt currently demonstrated scissoring steppage on R LE, increased trunk rotation and deep core engagement with reciprical walking poles.    Time  10     Period  Weeks    Status  Achieved      Additional Long Term Goals   Additional Long Term Goals  Yes      PT LONG TERM GOAL #6   Title  Pt will increase her distance on the 6MWT from 747 feet  to  > 900 feet  order to progress with aerobic exercises and walking longer distances in the community    Time  8    Period  Weeks    Status  On-going      PT LONG TERM GOAL #7   Title  Pt will report waking up > 2 mornings in a row with pain level < 5-6/10 in order to demo maintanence of Tx benefits and more balance of mm tensions achieved with HEP, shoe lift to improve QOL    Baseline  7-8/10 level and benefits from Tx lasted only one morning  (03-19-19 and 03-23-19)    Time  10    Period  Weeks    Status  Partially Met      PT LONG TERM GOAL #8   Title  Pt will be able to demo increased PF strength with unilateral UE support on wall from B 3/5 15 reps in order to achieve stronger push off in gait and walk longer distance    Baseline  B, L 7 reps  2/5, 10 reps R 3/5  ( 12/17: 15 reps R , 13 reps L, 06/01/19: R 20 reps, L 18 reps  )    Time  10    Period  Weeks    Status  Achieved      PT LONG TERM GOAL  #9   TITLE  Pt will demo shoulder abduction > 120 deg B with improved alignment of thorax over pelvis without manual cues in order to demo proper co-activation of B equal weight bearing in BLE, and decreased thoracic kyhposis/ decreased thoracic rotation 2/2 scoliosis in order to lift plates overhead cabinets/ decreased risk for    Time  10    Period  Weeks    Status  Achieved      PT LONG TERM GOAL  #10   TITLE  Pt will demo increased height from 63" to > 63 1/2 " and maintain 63 1/2" across 3 weeks ( once a week measurement) and forward head posture improve ( decreased distance from wall to earlobe 17 cm from earlobe to wall to > 12 cm)  in order to be ready to progress to PNF exercise with less shoulder pain and lengthen/ strengthen concave curve on R 2/2 scoliosis to minimize L lateral  thigh tightness  (07/06/19 : 63 3/4",  08/10/19: 64"   )    Time  8    Period  Weeks    Status  Achieved      PT LONG TERM GOAL  #11   TITLE  Pt will demo single UE support with lunge position exercises/ yoga poses  in order to demo improved balance and IND with flexibility routine    Baseline  BUE support    Time  10    Period  Weeks    Status  On-going      PT LONG TERM GOAL  #12  TITLE  Pt will demo L trunk rotation in R sidelying with zero report of pain in order to progress with longer walking endurance    Time  10    Period  Weeks    Status  New      PT LONG TERM GOAL  #13   TITLE  Pt will demo increased  shoulder abd R against wall in mini squat from 55 deg to > 75 deg in order to demo increased thoracic extension and intercostal lengthening for functional BUE mobility without compensations.  ( 4/2: 88 deg R )    Time  8    Period  Weeks    Status  Achieved      PT LONG TERM GOAL  #14   TITLE  Pt will demo less forward head posture ( earlobe to wall <13  cm) in order to stand taller w/ less kyphosis and less deviations to spine to walk longer distances    Baseline  ( earlobe to wall 16 cm)    Time  10    Period  Weeks    Status  New    Target Date  11/12/19            Plan - 09/03/19 1058    Clinical Impression Statement L intercostals at T/ L junction and medial to scapular have improved from last week's manual Tx and HEP.  Today, working on interspinals at C/T junction.  Pt reported reproduction of pain around L outside area of knee with palpation at L levator mm. Manual Tx decreased the tightness of L levator scapulae,  supraspinatus, interspinals, rhomboids, intercostals 3-7 and pain was no longer reproduced at L lateral knee.   Pt required less cues for more anterior COM in seated position with shoulder abduction. Pt has been able to achieve 115 deg B this week in this position which is an improvement compared to last session. Pt has been compliant with not  wearing a heavy purse on one shoulder which will help with continued lengthening of her spine and intercostals 2/2 scoliosis.  Plan to continue addressing forward head posture and spinal extensions. Pt continues to benefit from skilled PT     Personal Factors and Comorbidities  Age;Comorbidity 3+    Comorbidities  Osteoporosis, scoliosis, Arthritis, COPD, recent lumbosacral fusion for spondylolisthesis.    Examination-Activity Limitations  Toileting;Sit;Stand;Bend;Lift;Carry    Examination-Participation Restrictions  Interpersonal Relationship;Yard Work;Cleaning;Laundry    Stability/Clinical Decision Making  Unstable/Unpredictable    Rehab Potential  Good    PT Frequency  2x / week    PT Duration  Other (comment)   20   PT Treatment/Interventions  ADLs/Self Care Home Management;Aquatic Therapy;Moist Heat;Electrical Stimulation;Traction;Therapeutic activities;Functional mobility training;Stair training;Gait training;Therapeutic exercise;Balance training;Neuromuscular re-education;Patient/family education;Manual techniques;Dry needling;Passive range of motion;Scar mobilization;Taping    Consulted and Agree with Plan of Care  Patient       Patient will benefit from skilled therapeutic intervention in order to improve the following deficits and impairments:  Abnormal gait, Decreased balance, Increased muscle spasms, Decreased range of motion, Decreased scar mobility, Improper body mechanics, Decreased coordination, Decreased strength, Increased fascial restricitons, Impaired flexibility, Postural dysfunction, Pain  Visit Diagnosis: Abnormal posture  Chronic right shoulder pain  Other muscle spasm     Problem List Patient Active Problem List   Diagnosis Date Noted  . Chronic UTI 12/06/2018  . Status post lumbar surgery 12/06/2018  . Spondylolisthesis, lumbar region 09/26/2018  . Aortic atherosclerosis ([place]) 08/31/2018  . CAD (coronary artery disease)  02/23/2018  . Advanced care  planning/counseling discussion 03/28/2017  . Varicose veins of both lower extremities with complications 34/91/7915  . Arthritis 12/09/2016  . Vaginal atrophy 09/10/2015  . Menopausal hot flushes 09/10/2015  . Onychomycosis due to dermatophyte 04/07/2015  . Bursitis of right shoulder 02/26/2015  . Environmental and seasonal allergies 02/26/2015  . Other allergic rhinitis 02/26/2015  . Osteoporosis 11/26/2014  . COPD (chronic obstructive pulmonary disease) ([place]) 11/26/2014  . Fuchs' corneal dystrophy 11/26/2014  . Benign hypertensive renal disease 11/26/2014  . Hyperlipidemia 11/26/2014  . Chronic kidney disease, stage III (moderate) ([place]) 11/26/2014  . Hypothyroidism 11/26/2014  . Endothelial corneal dystrophy 11/26/2014    Jerl Mina ,PT, DPT, E-RYT  09/03/2019, 4:54 PM  [place] MAIN [facility] SERVICES [address] [place], [place], [address] Phone: [phone]   Fax:  [phone]  Name: Ezrie C Hsu MRN: [id number] Date of Birth: 03-23-1946

## 2019-09-03 NOTE — Patient Instructions (Signed)
"  W"   On back: With rolled towel under neck ( rolled not to thick, just enough to keep head in line like you are against a wall)   Press shoulder away from ears   and elbow down and back into bed   5 sec holds  10 reps   X 3 x set during day  ___

## 2019-09-04 ENCOUNTER — Encounter: Payer: Medicare Other | Admitting: Physical Therapy

## 2019-09-05 ENCOUNTER — Other Ambulatory Visit: Payer: Self-pay

## 2019-09-05 ENCOUNTER — Ambulatory Visit: Payer: Medicare Other | Admitting: Physical Therapy

## 2019-09-05 DIAGNOSIS — M25511 Pain in right shoulder: Secondary | ICD-10-CM | POA: Diagnosis not present

## 2019-09-05 DIAGNOSIS — G8929 Other chronic pain: Secondary | ICD-10-CM

## 2019-09-05 DIAGNOSIS — R293 Abnormal posture: Secondary | ICD-10-CM

## 2019-09-05 DIAGNOSIS — M62838 Other muscle spasm: Secondary | ICD-10-CM | POA: Diagnosis not present

## 2019-09-05 DIAGNOSIS — J301 Allergic rhinitis due to pollen: Secondary | ICD-10-CM | POA: Diagnosis not present

## 2019-09-05 DIAGNOSIS — R2689 Other abnormalities of gait and mobility: Secondary | ICD-10-CM | POA: Diagnosis not present

## 2019-09-05 NOTE — Therapy (Signed)
[place] MAIN [facility] SERVICES [address] [place], [place], [address] Phone: [phone]   Fax:  [phone]  Physical Therapy Treatment  Patient Details  Name: Julia Deleon MRN: [id number] Date of Birth: 1945-07-17 Referring Provider (PT): Dr. Maryan Puls   Encounter Date: 09/05/2019  PT End of Session - 09/05/19 0852    Visit Number  [id number]    Date for PT Re-Evaluation  09/11/19   Progress note to report #84 (last on #74 08/10/19)   Authorization Type  Medicare    PT Start Time  0800    PT Stop Time  8381    PT Time Calculation (min)  57 min    Activity Tolerance  Patient tolerated treatment well;No increased pain    Behavior During Therapy  WFL for tasks assessed/performed       Past Medical History:  Diagnosis Date  . Arthritis   . COPD (chronic obstructive pulmonary disease) ([place])   . Family history of adverse reaction to anesthesia    father was slow to wake up  . Fuch's endothelial dystrophy   . Hyperlipidemia   . Hypothyroidism   . PONV (postoperative nausea and vomiting)    slow to wake up and PONV    Past Surgical History:  Procedure Laterality Date  . APPENDECTOMY    . BACK SURGERY  09/26/2018   L4-5 PLIF by Dr. Arnoldo Morale  . CARDIAC CATHETERIZATION    . CHOLECYSTECTOMY    . EYE SURGERY    . FOOT SURGERY Right    pin removed left  . laser vein surgery    . NASAL SINUS SURGERY    . RIGHT/LEFT HEART CATH AND CORONARY ANGIOGRAPHY N/A 12/06/2017   Procedure: RIGHT/LEFT HEART CATH AND CORONARY ANGIOGRAPHY;  Surgeon: Yolonda Kida, MD;  Location: [place] [facility];  Service: Cardiovascular;  Laterality: N/A;  . TONSILLECTOMY      There were no vitals filed for this visit.  Subjective Assessment - 09/05/19 0854    Subjective  Pt reported she felt sore after last session and put heat pack.         [facility] PT Assessment - 09/05/19 0858      Coordination   Gross Motor Movements are Fluid and Coordinated  --    poor segmental coordination/cerv retraction w/ cerv rotation     AROM   Overall AROM Comments  limited L rotation at midthoracic , with cue for cervical retraction pt achieved more thoracic L posterior rotation      Palpation   Spinal mobility  interspinals, intercostals T9-10, iliocostalis L tightness                    [facility] Adult PT Treatment/Exercise - 09/05/19 0859      Therapeutic Activites    Other Therapeutic Activities  explained tightness of L leg and anatomy of mm connecting to thoracic and cervical       Neuro Re-ed    Neuro Re-ed Details   excessive cues for coordination of trunk rotation segmental with cervical spine,  cervical retraction       Moist Heat Therapy   Number Minutes Moist Heat  5 Minutes    Moist Heat Location  --   L back (guided relaxation)      Manual Therapy   Manual therapy comments  STm/MWM  interspinals, intercostals T9-10, iliocostalis               PT Short Term  Goals - 06/08/19 1133      PT SHORT TERM GOAL #1   Title  Patient will demonstrate improved pelvic alignment and balance of musculature surrounding the pelvis to facilitate decreased PFM spasms and decrease pelvic pain.    Baseline  Pt. demonstrates severe posterior pelvic tilt, L thoracic/R lumbar scoliosis, and R anterior/L posterior pelvic obliquity. Pt. has demonstrated improved pelvic alignment and posture but has not been able to sustain long-term due to continued lack of postural strength.    Time  5    Period  Weeks    Status  Achieved    Target Date  03/06/19      PT SHORT TERM GOAL #2   Title  Patient will demonstrate a coordinated contraction, relaxation, and bulge of the pelvic floor muscles to demonstrate functional recruitment and motion and allow for bladder emptying.    Baseline  Pt. is unable to fully empty bladder and describes pain with catheterization, demonstrating high tone of PFM.    Time  5    Period  Weeks    Status  Achieved     Target Date  03/06/19      PT SHORT TERM GOAL #3   Title  Patient will demonstrate improved sitting and standing posture to demonstrate learning and decrease stress on the pelvic floor with functional activity.    Baseline  Pt. demonstrates severe posterior pelvic tilt, L thoracic/R lumbar scoliosis, and R anterior/L posterior pelvic obliquity. 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 Will be able to cath without pain and will be cleared by physiscian to d/c self-cath due to decreased residual volume with bladder emptying.    Baseline  required to cath 3 times per day, painful each time.    Time  10    Period  Weeks    Status  Achieved      PT LONG TERM GOAL #4   Title  Patient will score less than or equal to 40% on the Female NIH-CPSI and 30% on the [facility] to demonstrate a reduction in pain, urinary symptoms, and an improved quality of life.    Baseline  Female NIH-CPSI: 38/43 (88%) , PDI: 45/75 (60%); [place] 44/75 (59%) and Female NIH-CPSI: 27/43 (64%) on 02-08-19 , Female NIH-CPSI: 17% on 03-26-19     Time  10    Period  Weeks    Status  Achieved      PT LONG TERM GOAL #5   Title  Patient will demonstrate increased step length with reciprical arm swing, and demonstrate no scissoring steps.    Baseline  Pt currently demonstrated scissoring steppage on R LE, increased trunk rotation and deep core engagement with reciprical walking poles.    Time  10    Period  Weeks    Status  Achieved      Additional Long Term Goals   Additional Long Term Goals  Yes      PT LONG TERM GOAL #6   Title  Pt will increase her distance on the 6MWT from 747 feet  to  > 900 feet  order to progress with aerobic exercises and walking longer distances in the community    Time  8    Period  Weeks    Status  On-going      PT LONG TERM GOAL #7   Title  Pt will report waking up > 2 mornings in a row with pain level < 5-6/10 in order to demo maintanence of Tx benefits and more balance of mm tensions achieved with HEP, shoe lift to improve QOL    Baseline  7-8/10 level and benefits from Tx lasted only one morning  (03-19-19 and 03-23-19)    Time  10    Period  Weeks    Status  Partially Met      PT LONG TERM GOAL #8   Title  Pt will be able to demo increased PF strength with unilateral UE support on wall from B 3/5 15 reps in order to achieve stronger push off in gait and walk longer distance    Baseline  B, L 7 reps  2/5, 10 reps R 3/5  ( 12/17: 15 reps R , 13 reps L, 06/01/19: R 20 reps, L 18 reps  )    Time  10    Period  Weeks    Status  Achieved      PT LONG TERM GOAL  #9   TITLE  Pt will demo shoulder abduction > 120 deg B with improved alignment of thorax over pelvis without manual cues in order to demo proper co-activation of B equal weight bearing in BLE, and decreased thoracic kyhposis/ decreased thoracic rotation 2/2 scoliosis in order to lift plates overhead cabinets/ decreased risk for    Time  10    Period  Weeks    Status  Achieved      PT LONG TERM GOAL  #10   TITLE  Pt will demo increased  height from 63" to > 63 1/2 " and maintain 63 1/2" across 3 weeks ( once a week measurement) and forward head posture improve ( decreased distance from wall to earlobe 17 cm from earlobe to wall to > 12 cm)  in order to be ready to progress to PNF exercise with less shoulder pain and lengthen/ strengthen concave curve on R 2/2 scoliosis to minimize L lateral thigh tightness  (07/06/19 : 63 3/4",  08/10/19: 64"   )    Time  8    Period  Weeks    Status  Achieved      PT LONG TERM GOAL  #11  TITLE  Pt will demo single UE support with lunge position exercises/ yoga poses  in order to demo improved balance and IND with flexibility routine    Baseline  BUE support    Time  10    Period  Weeks    Status  On-going      PT LONG TERM GOAL  #12   TITLE  Pt will demo L trunk rotation in R sidelying with zero report of pain in order to progress with longer walking endurance    Time  10    Period  Weeks    Status  New      PT LONG TERM GOAL  #13   TITLE  Pt will demo increased  shoulder abd R against wall in mini squat from 55 deg to > 75 deg in order to demo increased thoracic extension and intercostal lengthening for functional BUE mobility without compensations.  ( 4/2: 88 deg R )    Time  8    Period  Weeks    Status  Achieved      PT LONG TERM GOAL  #14   TITLE  Pt will demo less forward head posture ( earlobe to wall <13  cm) in order to stand taller w/ less kyphosis and less deviations to spine to walk longer distances    Baseline  ( earlobe to wall 16 cm)    Time  10    Period  Weeks    Status  New    Target Date  11/12/19            Plan - 09/05/19 0853    Clinical Impression Statement  Continued to work on intercostal lengthening, L thoracic rotation to promote more spinal extension. Pt has referred tightness of L thigh with palpation at iliocostalis/ interspinal L which is likely linked to thoracic kyphosis and scoliosis. Pt demo'd limited L rotation at midthoracic , but with cue  for cervical retraction, pt achieved more thoracic L posterior rotation. Pt demo'd tendency to be in cervical extension with thoracic rotation and required excessive cues for segmental coordination of thoracic preceeding cervical rotation.  Anticipate that with more thoracic extension, cervical retraction training and manual Tx, pt will be able to achieve more shoulder abduction and L thoracic rotation for gait mechanics.   Pt continues to benefit from skilled PT.    Personal Factors and Comorbidities  Age;Comorbidity 3+    Comorbidities  Osteoporosis, scoliosis, Arthritis, COPD, recent lumbosacral fusion for spondylolisthesis.    Examination-Activity Limitations  Toileting;Sit;Stand;Bend;Lift;Carry    Examination-Participation Restrictions  Interpersonal Relationship;Yard Work;Cleaning;Laundry    Stability/Clinical Decision Making  Unstable/Unpredictable    Rehab Potential  Good    PT Frequency  2x / week    PT Duration  Other (comment)   20   PT Treatment/Interventions  ADLs/Self Care Home Management;Aquatic Therapy;Moist Heat;Electrical Stimulation;Traction;Therapeutic activities;Functional mobility training;Stair training;Gait training;Therapeutic exercise;Balance training;Neuromuscular re-education;Patient/family education;Manual techniques;Dry needling;Passive range of motion;Scar mobilization;Taping    Consulted and Agree with Plan of Care  Patient       Patient will benefit from skilled therapeutic intervention in order to improve the following deficits and impairments:  Abnormal gait, Decreased balance, Increased muscle spasms, Decreased range of motion, Decreased scar mobility, Improper body mechanics, Decreased coordination, Decreased strength, Increased fascial restricitons, Impaired flexibility, Postural dysfunction, Pain  Visit Diagnosis: Abnormal posture  Chronic right shoulder pain  Other muscle spasm     Problem List Patient Active Problem List   Diagnosis Date Noted  .  Chronic UTI 12/06/2018  . Status post lumbar surgery 12/06/2018  . Spondylolisthesis, lumbar region 09/26/2018  . Aortic atherosclerosis ([place]) 08/31/2018  . CAD (coronary artery disease) 02/23/2018  . Advanced care planning/counseling discussion 03/28/2017  . Varicose veins of both lower extremities with complications 72/76/1848  . Arthritis 12/09/2016  . Vaginal atrophy 09/10/2015  . Menopausal hot flushes 09/10/2015  . Onychomycosis due to dermatophyte 04/07/2015  . Bursitis of right shoulder 02/26/2015  . Environmental and seasonal allergies 02/26/2015  . Other allergic rhinitis 02/26/2015  . Osteoporosis 11/26/2014  . COPD (chronic obstructive pulmonary disease) ([place]) 11/26/2014  . Fuchs' corneal dystrophy 11/26/2014  . Benign hypertensive renal disease 11/26/2014  . Hyperlipidemia 11/26/2014  . Chronic kidney disease, stage III (moderate) ([place]) 11/26/2014  . Hypothyroidism 11/26/2014  . Endothelial corneal dystrophy 11/26/2014    Jerl Mina ,PT, DPT, E-RYT  09/05/2019, 11:48 AM  [place] MAIN [facility] SERVICES [address] [place], [place], [address] Phone: [phone]   Fax:  [phone]  Name: Julia Deleon MRN: [id number] Date of Birth: 1946-01-23

## 2019-09-05 NOTE — Patient Instructions (Signed)
[place] L ribs, increase L rotation and improving head alignment above spine   Open book exercise, do it slower Pillow between knees, and behind back  When lying on R side  Rotating L drag forearm across chest Tuck chin, turn L   ( do sequentially from navel, chest, then head turn  DO NOT turn head first, think spiral staircase)   15 reps  ___   Do the same at wall in mini squat position with towel folded behind your head and you have to press head against towel to keep it from falling  Rotate L trunk then head with chin tucked Feel edge of doorway jutting into muscles inside shoulder blade as you turn  15 reps

## 2019-09-06 DIAGNOSIS — M5432 Sciatica, left side: Secondary | ICD-10-CM | POA: Diagnosis not present

## 2019-09-07 ENCOUNTER — Other Ambulatory Visit: Payer: Self-pay | Admitting: Neurosurgery

## 2019-09-07 ENCOUNTER — Telehealth: Payer: Self-pay

## 2019-09-07 DIAGNOSIS — J310 Chronic rhinitis: Secondary | ICD-10-CM | POA: Diagnosis not present

## 2019-09-07 DIAGNOSIS — J439 Emphysema, unspecified: Secondary | ICD-10-CM | POA: Diagnosis not present

## 2019-09-07 DIAGNOSIS — R0600 Dyspnea, unspecified: Secondary | ICD-10-CM | POA: Diagnosis not present

## 2019-09-07 DIAGNOSIS — M5432 Sciatica, left side: Secondary | ICD-10-CM

## 2019-09-07 NOTE — Telephone Encounter (Signed)
Phone call to patient to verify medication list and allergies for myelogram procedure. Medications pt is currently taking are safe to continue to take. Advised pt if any new medications are started prior to procedure to call and make [place] aware. Pt also instructed to have a driver the day of the procedure, pre and post op instructions reviewed with pt. Pt verbalized understanding.

## 2019-09-10 ENCOUNTER — Ambulatory Visit: Payer: Medicare Other | Attending: Urology | Admitting: Physical Therapy

## 2019-09-10 ENCOUNTER — Other Ambulatory Visit: Payer: Self-pay

## 2019-09-10 DIAGNOSIS — G8929 Other chronic pain: Secondary | ICD-10-CM | POA: Diagnosis not present

## 2019-09-10 DIAGNOSIS — R293 Abnormal posture: Secondary | ICD-10-CM | POA: Diagnosis not present

## 2019-09-10 DIAGNOSIS — M25511 Pain in right shoulder: Secondary | ICD-10-CM | POA: Insufficient documentation

## 2019-09-10 DIAGNOSIS — M4125 Other idiopathic scoliosis, thoracolumbar region: Secondary | ICD-10-CM | POA: Diagnosis not present

## 2019-09-10 DIAGNOSIS — M62838 Other muscle spasm: Secondary | ICD-10-CM | POA: Diagnosis not present

## 2019-09-11 NOTE — Patient Instructions (Signed)
Tuck chin, shoulders down From R , down, to L then up   ( like looking at map in front of you:  [place], [place], [place], to [employer])   10 reps    ___  [employer] on your back   ___

## 2019-09-11 NOTE — Therapy (Signed)
[place] MAIN [facility] SERVICES [address] [place], [place], [address] Phone: [phone]   Fax:  [phone]  Physical Therapy Treatment  / Progress Note  Patient Details  Name: Julia Deleon MRN: [id number] Date of Birth: [birth year] Referring Provider (PT): Dr. Maryan Puls   Encounter Date: 09/10/2019  PT End of Session - 09/11/19 0810    Visit Number  [id number]    Date for PT Re-Evaluation  11/20/19   Progress note to report #93 (last on #83  5/3//21)   Authorization Type  Medicare    PT Start Time  1500    PT Stop Time  1558    PT Time Calculation (min)  58 min    Activity Tolerance  Patient tolerated treatment well;No increased pain    Behavior During Therapy  WFL for tasks assessed/performed       Past Medical History:  Diagnosis Date  . Arthritis   . COPD (chronic obstructive pulmonary disease) ([place])   . Family history of adverse reaction to anesthesia    father was slow to wake up  . Fuch's endothelial dystrophy   . Hyperlipidemia   . Hypothyroidism   . PONV (postoperative nausea and vomiting)    slow to wake up and PONV    Past Surgical History:  Procedure Laterality Date  . APPENDECTOMY    . BACK SURGERY  09/26/2018   L4-5 PLIF by Dr. Arnoldo Morale  . CARDIAC CATHETERIZATION    . CHOLECYSTECTOMY    . EYE SURGERY    . FOOT SURGERY Right    pin removed left  . laser vein surgery    . NASAL SINUS SURGERY    . RIGHT/LEFT HEART CATH AND CORONARY ANGIOGRAPHY N/A 12/06/2017   Procedure: RIGHT/LEFT HEART CATH AND CORONARY ANGIOGRAPHY;  Surgeon: Yolonda Kida, MD;  Location: [facility];  Service: Cardiovascular;  Laterality: N/A;  . TONSILLECTOMY      There were no vitals filed for this visit.  Subjective Assessment - 09/10/19 1510    Subjective  Pt report no changes to her tightness nor pain. She saw her surgeon and the MRI did not show what it was suppose to show. Pt would         [facility] PT Assessment - 09/11/19  0815      Coordination   Fine Motor Movements are Fluid and Coordinated  --   cervical movement w/ sideflexion/ ext  , required cues      AROM   Overall AROM Comments  seated position L shoulder abduction 125 deg , R 115 deg .       Palpation   Spinal mobility  palpation at T10 and T3  intercostal reproduced radiating pain to L lateral knee , tightness along iliocostalis L                    [facility] Adult PT Treatment/Exercise - 09/11/19 9774      Neuro Re-ed    Neuro Re-ed Details   cued for sequential rotation of thoracic then cervical rotation. Cued for cervical retraction/ rotation and not with sideflexion      Manual Therapy   Manual therapy comments  STM/MWM at T3, T10 intercostals , iliocostalis L                PT Short Term Goals - 06/08/19 1133      PT SHORT TERM GOAL #1   Title  Patient will demonstrate improved pelvic  alignment and balance of musculature surrounding the pelvis to facilitate decreased PFM spasms and decrease pelvic pain.    Baseline  Pt. demonstrates severe posterior pelvic tilt, L thoracic/R lumbar scoliosis, and R anterior/L posterior pelvic obliquity. Pt. has demonstrated improved pelvic alignment and posture but has not been able to sustain long-term due to continued lack of postural strength.    Time  5    Period  Weeks    Status  Achieved    Target Date  03/06/19      PT SHORT TERM GOAL #2   Title  Patient will demonstrate a coordinated contraction, relaxation, and bulge of the pelvic floor muscles to demonstrate functional recruitment and motion and allow for bladder emptying.    Baseline  Pt. is unable to fully empty bladder and describes pain with catheterization, demonstrating high tone of PFM.    Time  5    Period  Weeks    Status  Achieved    Target Date  03/06/19      PT SHORT TERM GOAL #3   Title  Patient will demonstrate improved sitting and standing posture to demonstrate learning and decrease stress on the pelvic  floor with functional activity.    Baseline  Pt. demonstrates severe posterior pelvic tilt, L thoracic/R lumbar scoliosis, and R anterior/L posterior pelvic obliquity. 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 Will be able to cath without pain and will be cleared by physiscian to d/c self-cath due to decreased residual volume with bladder emptying.    Baseline  required to cath 3 times per day, painful each time.    Time  10    Period  Weeks    Status  Achieved      PT LONG TERM GOAL #4   Title  Patient will score less than or equal to 40% on the Female NIH-CPSI and 30% on the [facility] to demonstrate a reduction in pain, urinary symptoms, and an improved quality of life.    Baseline  Female NIH-CPSI: 38/43 (88%) , PDI: 45/75 (60%); PDI 44/75 (59%) and Female NIH-CPSI: 27/43 (64%) on 02-08-19 , Female NIH-CPSI: 17%  on 03-26-19    Time  10    Period  Weeks    Status  Achieved      PT LONG TERM GOAL #5   Title  Patient will demonstrate increased step length with reciprical arm swing, and demonstrate  no scissoring steps.    Baseline  Pt currently demonstrated scissoring steppage on R LE, increased trunk rotation and deep core engagement with reciprical walking poles.    Time  10    Period  Weeks    Status  Achieved      PT LONG TERM GOAL #6   Title  Pt will increase her distance on the 6MWT from 747 feet  to  > 900 feet  order to progress with aerobic exercises and walking longer distances in the community    Time  8    Period  Weeks    Status  On-going      PT LONG TERM GOAL #7   Title  Pt will report waking up > 2 mornings in a row with pain level < 5-6/10 in order to demo maintanence of Tx benefits and more balance of mm tensions achieved with HEP, shoe lift to improve QOL    Baseline  7-8/10 level and benefits from Tx lasted only one morning  (03-19-19 and 03-23-19)    Time  10    Period  Weeks    Status  Partially Met      PT LONG TERM GOAL #8   Title  Pt will be able to demo increased PF strength with unilateral UE support on wall from B 3/5 15 reps in order to achieve stronger push off in gait and walk longer distance    Baseline  B, L 7 reps  2/5, 10 reps R 3/5  ( 12/17: 15 reps R , 13 reps L, 06/01/19: R 20 reps, L 18 reps  )    Time  10    Period  Weeks    Status  Achieved      PT LONG TERM GOAL  #9   TITLE  Pt will demo shoulder abduction > 120 deg B with improved alignment of thorax over pelvis without manual cues in order to demo proper co-activation of B equal weight bearing in BLE, and decreased thoracic kyhposis/ decreased thoracic rotation 2/2 scoliosis in order to lift plates overhead cabinets/ decreased risk for    Time  10    Period  Weeks    Status  Achieved      PT LONG TERM GOAL  #10   TITLE  Pt will demo increased height from 63" to > 63 1/2 " and maintain 63 1/2" across 3 weeks ( once a week measurement) and forward head posture improve ( decreased distance from wall to earlobe 17 cm from earlobe to wall to > 12 cm)  in order to be ready to progress to PNF  exercise with less shoulder pain and lengthen/ strengthen concave curve on R 2/2 scoliosis to minimize L lateral thigh tightness  (07/06/19 : 63 3/4",  08/10/19: 64"   )    Time  8    Period  Weeks    Status  Achieved      PT LONG TERM GOAL  #11   TITLE  Pt will demo single UE support with lunge position exercises/ yoga poses  in order to demo improved balance and IND with flexibility routine    Baseline  BUE support    Time  10    Period  Weeks    Status  On-going      PT LONG TERM GOAL  #12   TITLE  Pt will demo L trunk rotation in R sidelying with zero report of pain in order to progress with longer walking endurance    Time  10    Period  Weeks    Status  Achieved      PT LONG TERM GOAL  #13   TITLE  Pt will demo increased  shoulder abd R against wall in mini squat from 55 deg to > 75 deg in order to demo increased thoracic extension and intercostal lengthening for functional BUE mobility without compensations.  ( 4/2: 88 deg R )    Time  8    Period  Weeks    Status  Achieved      PT LONG TERM GOAL  #14   TITLE  Pt will demo less forward head posture ( earlobe to wall <13  cm) in order to stand taller w/ less kyphosis and less deviations to spine to walk longer distances    Baseline  ( earlobe to wall 16 cm)    Time  10    Period  Weeks    Status  On-going            Plan - 09/11/19 2952    Clinical Impression Statement Pt has achieved 9/14 goals and progressing towards remaining goals.   Pt has achieved and maintained more height from 61 " to 63" which indicates less thoracic kyphosis. Pt also has achieved and maintained increased shoulder abduction in standing and seated positions without compensatory strategies with posterior tilt of pelvis/ lumbar lordosis which also indicates a more lengthened spine, less thoracic kyhposis/ scoliosis curvature.  Pt continues to require manual Tx to continue increasing intercostal space 2/2 to scoliosis and thoracic kyphosis which will  continue to help with increasing height, minimizing risk for worsening of osteoporosis/ risks for Fx, worsening of scoliosis. Manual Tx  Has been applied to increasing intercostal spaces which addresses scoliotic curves and thoracic kyphosis and helping pt achieve more shoulder abduction and overhead exercise as well as better reciprocal rotation of thorax/ pelvis and spinal extension mm endurance in gait. Currently working on cervical retraction and scapular depression/ retraction to minimize forward head posture. Palpation along tight L iliocostalis mm refers to L thigh tightness to knee which verifies how her L convex curve from scoliosis is impacting her L thigh tightness. Pt continues to be compliant with scoliosis specific strengthening exercises which is helping with building endurance to back muscles for lengthening the mm related to concave curve. Anticipate as pt gains more endurance to intercostal and spinal extensor mm, pt's walking endurance will improve with less report of tightness of L thigh to knee.  Pt continues to benefit from skilled PT.      Personal Factors and Comorbidities  Age;Comorbidity 3+    Comorbidities  Osteoporosis, scoliosis, Arthritis, COPD, recent lumbosacral fusion for spondylolisthesis.    Examination-Activity Limitations  Toileting;Sit;Stand;Bend;Lift;Carry    Examination-Participation Restrictions  Interpersonal Relationship;Yard Work;Cleaning;Laundry    Stability/Clinical Decision Making  Unstable/Unpredictable    Rehab Potential  Good    PT Frequency  2x / week    PT Duration  Other (comment)   20   PT Treatment/Interventions  ADLs/Self Care Home Management;Aquatic Therapy;Moist Heat;Electrical Stimulation;Traction;Therapeutic activities;Functional mobility training;Stair training;Gait training;Therapeutic exercise;Balance training;Neuromuscular re-education;Patient/family education;Manual techniques;Dry needling;Passive range of motion;Scar mobilization;Taping     Consulted  and Agree with Plan of Care  Patient       Patient will benefit from skilled therapeutic intervention in order to improve the following deficits and impairments:  Abnormal gait, Decreased balance, Increased muscle spasms, Decreased range of motion, Decreased scar mobility, Improper body mechanics, Decreased coordination, Decreased strength, Increased fascial restricitons, Impaired flexibility, Postural dysfunction, Pain  Visit Diagnosis: Abnormal posture  Chronic right shoulder pain  Other muscle spasm     Problem List Patient Active Problem List   Diagnosis Date Noted  . Chronic UTI 12/06/2018  . Status post lumbar surgery 12/06/2018  . Spondylolisthesis, lumbar region 09/26/2018  . Aortic atherosclerosis ([place]) 08/31/2018  . CAD (coronary artery disease) 02/23/2018  . Advanced care planning/counseling discussion 03/28/2017  . Varicose veins of both lower extremities with complications 99/14/4458  . Arthritis 12/09/2016  . Vaginal atrophy 09/10/2015  . Menopausal hot flushes 09/10/2015  . Onychomycosis due to dermatophyte 04/07/2015  . Bursitis of right shoulder 02/26/2015  . Environmental and seasonal allergies 02/26/2015  . Other allergic rhinitis 02/26/2015  . Osteoporosis 11/26/2014  . COPD (chronic obstructive pulmonary disease) ([place]) 11/26/2014  . Fuchs' corneal dystrophy 11/26/2014  . Benign hypertensive renal disease 11/26/2014  . Hyperlipidemia 11/26/2014  . Chronic kidney disease, stage III (moderate) ([place]) 11/26/2014  . Hypothyroidism 11/26/2014  . Endothelial corneal dystrophy 11/26/2014    Jerl Mina ,PT, DPT, E-RYT   09/11/2019, 8:17 AM  [place] MAIN [facility] SERVICES [address] [place], [place], [address] Phone: [phone]   Fax:  [phone]  Name: Julia Deleon MRN: [id number] Date of Birth: 09-02-1945

## 2019-09-14 ENCOUNTER — Ambulatory Visit: Payer: Medicare Other | Admitting: Physical Therapy

## 2019-09-14 ENCOUNTER — Other Ambulatory Visit: Payer: Self-pay

## 2019-09-14 DIAGNOSIS — G8929 Other chronic pain: Secondary | ICD-10-CM | POA: Diagnosis not present

## 2019-09-14 DIAGNOSIS — M4125 Other idiopathic scoliosis, thoracolumbar region: Secondary | ICD-10-CM

## 2019-09-14 DIAGNOSIS — M25511 Pain in right shoulder: Secondary | ICD-10-CM | POA: Diagnosis not present

## 2019-09-14 DIAGNOSIS — R293 Abnormal posture: Secondary | ICD-10-CM | POA: Diagnosis not present

## 2019-09-14 DIAGNOSIS — M62838 Other muscle spasm: Secondary | ICD-10-CM | POA: Diagnosis not present

## 2019-09-14 DIAGNOSIS — J301 Allergic rhinitis due to pollen: Secondary | ICD-10-CM | POA: Diagnosis not present

## 2019-09-14 NOTE — Patient Instructions (Addendum)
propiopcetion training  In seated position  with center of mass over ballmounds, feet firm on earth, Keep head stacked over center of mass  do not lead with head   Do several stomping with feet and whole spine follows not the head  ___  When standing, do not over do with pulling shoulders and arms back because the low back will compress and cause tightness/ radiating pain to knee  Reposition with center of mass over ballmounds , shoulders over ballmounds of feet   ___   Use the blue band in the seated exercise but do not raise hand higher than ear height  5 sec holds at ear level  5 reps

## 2019-09-14 NOTE — Therapy (Addendum)
[place] MAIN [facility] SERVICES [address] [place], [place], [address] Phone: [phone]   Fax:  [phone]  Physical Therapy Treatment / Progress Note reporting from 08/10/19 to 09/14/19   Patient Details  Name: Julia Deleon MRN: [id number] Date of Birth: 12/28/1945 Referring Provider (PT): Dr. Maryan Puls   Encounter Date: 09/14/2019  PT End of Session - 09/14/19 1405    Visit Number  [id number]    Date for PT Re-Evaluation  11/23/19   Progress note to report #84 (last on #84  09/14/19)   Authorization Type  Medicare    PT Start Time  6387    PT Stop Time  1100    PT Time Calculation (min)  58 min    Activity Tolerance  Patient tolerated treatment well;No increased pain    Behavior During Therapy  WFL for tasks assessed/performed       Past Medical History:  Diagnosis Date  . Arthritis   . COPD (chronic obstructive pulmonary disease) ([place])   . Family history of adverse reaction to anesthesia    father was slow to wake up  . Fuch's endothelial dystrophy   . Hyperlipidemia   . Hypothyroidism   . PONV (postoperative nausea and vomiting)    slow to wake up and PONV    Past Surgical History:  Procedure Laterality Date  . APPENDECTOMY    . BACK SURGERY  09/26/2018   L4-5 PLIF by Dr. Arnoldo Morale  . CARDIAC CATHETERIZATION    . CHOLECYSTECTOMY    . EYE SURGERY    . FOOT SURGERY Right    pin removed left  . laser vein surgery    . NASAL SINUS SURGERY    . RIGHT/LEFT HEART CATH AND CORONARY ANGIOGRAPHY N/A 12/06/2017   Procedure: RIGHT/LEFT HEART CATH AND CORONARY ANGIOGRAPHY;  Surgeon: Yolonda Kida, MD;  Location: [facility];  Service: Cardiovascular;  Laterality: N/A;  . TONSILLECTOMY      There were no vitals filed for this visit.  Subjective Assessment - 09/14/19 0913    Subjective  Pt has been doing her exercises. Pt drove 4 hr commute and it wore her out         [facility] PT Assessment - 09/14/19 0920      Assessment    Medical Diagnosis         Observation/Other Assessments   Observations  64   1 /4 "       Posture/Postural Control   Posture Comments  decreased propioception with more anterior COM , weight bearing on metatarsals      AROM   Overall AROM Comments  seated shoulder abduction without compensations at thoracic spine: 130 deg B , by wall 80 deg     Strength   Overall Strength Comments  single UE on wall: PF MMT 3/5   : R  20 reps, L 16 reps                     [facility] Adult PT Treatment/Exercise - 09/14/19 0920      Therapeutic Activites    Other Therapeutic Activities  reassessment, of goals, and measurements      Neuro Re-ed    Neuro Re-ed Details   excessive cues for more anterior COM and weight bearing, postural stability training               PT Short Term Goals - 06/08/19 1133      PT  SHORT TERM GOAL #1   Title  Patient will demonstrate improved pelvic alignment and balance of musculature surrounding the pelvis to facilitate decreased PFM spasms and decrease pelvic pain.    Baseline  Pt. demonstrates severe posterior pelvic tilt, L thoracic/R lumbar scoliosis, and R anterior/L posterior pelvic obliquity. Pt. has demonstrated improved pelvic alignment and posture but has not been able to sustain long-term due to continued lack of postural strength.    Time  5    Period  Weeks    Status  Achieved    Target Date  03/06/19      PT SHORT TERM GOAL #2   Title  Patient will demonstrate a coordinated contraction, relaxation, and bulge of the pelvic floor muscles to demonstrate functional recruitment and motion and allow for bladder emptying.    Baseline  Pt. is unable to fully empty bladder and describes pain with catheterization, demonstrating high tone of PFM.    Time  5    Period  Weeks    Status  Achieved    Target Date  03/06/19      PT SHORT TERM GOAL #3   Title  Patient will demonstrate improved sitting and standing posture to demonstrate learning and  decrease stress on the pelvic floor with functional activity.    Baseline  Pt. demonstrates severe posterior pelvic tilt, L thoracic/R lumbar scoliosis, and R anterior/L posterior pelvic obliquity. 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 Will be able to cath without pain and will be cleared by physiscian to d/c self-cath due to decreased residual volume with bladder emptying.    Baseline  required to cath 3 times per day, painful each time.    Time  10    Period  Weeks    Status  Achieved      PT LONG TERM GOAL #4   Title  Patient will score less than or equal to 40% on the Female NIH-CPSI and 30% on the [facility] to demonstrate a reduction in pain, urinary symptoms, and an improved quality of life.    Baseline  Female NIH-CPSI: 38/43 (88%) , PDI: 45/75 (60%); [place]  44/75 (59%) and Female NIH-CPSI: 27/43 (64%) on 02-08-19 , Female NIH-CPSI: 17% on 03-26-19    Time  10    Period  Weeks    Status  Achieved      PT LONG TERM GOAL #5   Title  Patient will demonstrate increased step length with  reciprical arm swing, and demonstrate no scissoring steps.    Baseline  Pt currently demonstrated scissoring steppage on R LE, increased trunk rotation and deep core engagement with reciprical walking poles.    Time  10    Period  Weeks    Status  Achieved      PT LONG TERM GOAL #6   Title  Pt will increase her distance on the 6MWT from 747 feet  to  > 900 feet  order to progress with aerobic exercises and walking longer distances in the community    Time  8    Period  Weeks    Status  On-going      PT LONG TERM GOAL #7   Title  Pt will report waking up > 2 mornings in a row with pain level < 5-6/10 in order to demo maintanence of Tx benefits and more balance of mm tensions achieved with HEP, shoe lift to improve QOL    Baseline  7-8/10 level and benefits from Tx lasted only one morning  (03-19-19 and 03-23-19)    Time  10    Period  Weeks    Status  Partially Met      PT LONG TERM GOAL #8   Title  Pt will be able to demo increased PF strength with unilateral UE support on wall from B 3/5 15 reps in order to achieve stronger push off in gait and walk longer distance    Baseline  B, L 7 reps  2/5, 10 reps R 3/5  ( 12/17: 15 reps R , 13 reps L, 06/01/19: R 20 reps, L 18 reps  )    Time  10    Period  Weeks    Status  Achieved      PT LONG TERM GOAL  #9   TITLE  Pt will demo shoulder abduction > 120 deg B with improved alignment of thorax over pelvis without manual cues in order to demo proper co-activation of B equal weight bearing in BLE, and decreased thoracic kyhposis/ decreased thoracic rotation 2/2 scoliosis in order to lift plates overhead cabinets/ decreased risk for    Time  10    Period  Weeks    Status  Achieved      PT LONG TERM GOAL  #10   TITLE  Pt will demo increased height from 63" to > 63 1/2 " and maintain 63 1/2" across 3 weeks ( once a week measurement) and forward head posture improve ( decreased distance from wall to earlobe 17 cm from earlobe to wall to > 12 cm)  in  order to be ready to progress to PNF exercise with less shoulder pain and lengthen/ strengthen concave curve on R 2/2 scoliosis to minimize L lateral thigh tightness  07/06/19 : 63 3/4" 08/10/19: 64"    09/14/19: 64  1/4"      Time  8    Period  Weeks    Status  Achieved      PT LONG TERM GOAL  #11   TITLE  Pt will demo single UE support with lunge position exercises/ yoga poses  in order to demo improved  balance and IND with flexibility routine    Baseline  BUE support    Time  10    Period  Weeks    Status  On-going      PT LONG TERM GOAL  #12   TITLE  Pt will demo L trunk rotation in R sidelying with zero report of pain in order to progress with longer walking endurance    Time  10    Period  Weeks    Status  Achieved      PT LONG TERM GOAL  #13   TITLE  Pt will demo increased  shoulder abd R against wall in mini squat from 55 deg to > 75 deg in order to demo increased thoracic extension and intercostal lengthening for functional BUE mobility without compensations.  ( 4/2: 88 deg R )    Time  8    Period  Weeks    Status  Achieved      PT LONG TERM GOAL  #14   TITLE  Pt will demo less forward head posture ( earlobe to wall <13  cm) nd maintain height of 64  1/4" for 1 month in order to stand taller w/ less kyphosis and less deviations to spine to walk longer distances    Baseline  ( earlobe to wall 16 cm)  ( 64  1/14" on 09/14/19  )     Time  10    Period  Weeks    Status  On-going            Plan - 09/14/19 1405    Clinical Impression Statement  Pt has achieved 10/14 goals and progressing well towards remaining goals.  Pt's height has made an tremendous improvement with manual Tx focused on increasing intercostal space to minimize thoracic kyphosis and severe scoliosis. Pt's height has increased by  1  1/2 " .  07/06/19 : 63 3/4" 08/10/19: 64"    09/14/19: 64  1/4"     Pt's shoulder abduction has increased significantly from 70 deg to 150 deg without compensation and from 55  deg to 80 deg against wall.  This improvement contributes more thoracic extension/ less thoracic kyphosis and more cervical retraction and less forward head posture.   Rotational component of scoliosis has been minimized. Currently progressing to strengthening R upper thoracic concave curve and L lumbar concave cure to maintain more height with ambulation.    Provided today excessive cues for more propioception of center of mass to minimize slumped/ thoracic kyphosis and optimize spinal extension at thoracic spine without increased lumbar lordosis which contributes to her radicular Sx down L LE.   Pt continues to benefit from skilled PT to achieve remaining goals.  Pt remained very motivated and compliant to HEP    Personal Factors and Comorbidities  Age;Comorbidity 3+    Comorbidities  Osteoporosis, scoliosis, Arthritis, COPD, recent lumbosacral fusion for spondylolisthesis.    Examination-Activity Limitations  Toileting;Sit;Stand;Bend;Lift;Carry    Examination-Participation Restrictions  Interpersonal Relationship;Yard Work;Cleaning;Laundry    Stability/Clinical Decision Making  Unstable/Unpredictable    Rehab Potential  Good    PT Frequency  2x / week    PT Duration  Other (comment)   20   PT Treatment/Interventions  ADLs/Self Care Home Management;Aquatic Therapy;Moist Heat;Electrical Stimulation;Traction;Therapeutic activities;Functional mobility training;Stair training;Gait training;Therapeutic exercise;Balance training;Neuromuscular re-education;Patient/family education;Manual techniques;Dry needling;Passive range of motion;Scar mobilization;Taping    Consulted and Agree with Plan of Care  Patient       Patient will benefit from skilled  therapeutic intervention in order to improve the following deficits and impairments:  Abnormal gait, Decreased balance, Increased muscle spasms, Decreased range of motion, Decreased scar mobility, Improper body mechanics, Decreased coordination, Decreased  strength, Increased fascial restricitons, Impaired flexibility, Postural dysfunction, Pain  Visit Diagnosis: Abnormal posture  Chronic right shoulder pain  Other muscle spasm     Problem List Patient Active Problem List   Diagnosis Date Noted  . Chronic UTI 12/06/2018  . Status post lumbar surgery 12/06/2018  . Spondylolisthesis, lumbar region 09/26/2018  . Aortic atherosclerosis ([place]) 08/31/2018  . CAD (coronary artery disease) 02/23/2018  . Advanced care planning/counseling discussion 03/28/2017  . Varicose veins of both lower extremities with complications 12/52/4799  . Arthritis 12/09/2016  . Vaginal atrophy 09/10/2015  . Menopausal hot flushes 09/10/2015  . Onychomycosis due to dermatophyte 04/07/2015  . Bursitis of right shoulder 02/26/2015  . Environmental and seasonal allergies 02/26/2015  . Other allergic rhinitis 02/26/2015  . Osteoporosis 11/26/2014  . COPD (chronic obstructive pulmonary disease) ([place]) 11/26/2014  . Fuchs' corneal dystrophy 11/26/2014  . Benign hypertensive renal disease 11/26/2014  . Hyperlipidemia 11/26/2014  . Chronic kidney disease, stage III (moderate) ([place]) 11/26/2014  . Hypothyroidism 11/26/2014  . Endothelial corneal dystrophy 11/26/2014    Jerl Mina ,PT, DPT, E-RYT  09/14/2019, 2:10 PM  [place] MAIN [facility] SERVICES [address] [place], [place], [address] Phone: [phone]   Fax:  [phone]  Name: Julia Deleon MRN: [id number] Date of Birth: [birth year]

## 2019-09-17 ENCOUNTER — Other Ambulatory Visit: Payer: Self-pay

## 2019-09-17 ENCOUNTER — Ambulatory Visit: Payer: Medicare Other | Admitting: Physical Therapy

## 2019-09-17 DIAGNOSIS — R293 Abnormal posture: Secondary | ICD-10-CM

## 2019-09-17 DIAGNOSIS — M62838 Other muscle spasm: Secondary | ICD-10-CM

## 2019-09-17 DIAGNOSIS — M4125 Other idiopathic scoliosis, thoracolumbar region: Secondary | ICD-10-CM

## 2019-09-17 DIAGNOSIS — G8929 Other chronic pain: Secondary | ICD-10-CM

## 2019-09-17 DIAGNOSIS — M25511 Pain in right shoulder: Secondary | ICD-10-CM | POA: Diagnosis not present

## 2019-09-18 NOTE — Therapy (Addendum)
[place] MAIN [facility] SERVICES [address] [place], [place], [address] Phone: [phone]   Fax:  [phone]  Physical Therapy Treatment  Patient Details  Name: Julia Deleon MRN: [id number] Date of Birth: November 25, 1945 Referring Provider (PT): Dr. Maryan Puls   Encounter Date: 09/17/2019  PT End of Session - 09/17/19 1129    Visit Number  [id number]    Date for PT Re-Evaluation  11/23/19   Progress note to report #84 (last on #84  09/14/19)   Authorization Type  Medicare    PT Start Time  1100    PT Stop Time  1200    PT Time Calculation (min)  60 min    Activity Tolerance  Patient tolerated treatment well;No increased pain    Behavior During Therapy  WFL for tasks assessed/performed       Past Medical History:  Diagnosis Date  . Arthritis   . COPD (chronic obstructive pulmonary disease) ([place])   . Family history of adverse reaction to anesthesia    father was slow to wake up  . Fuch's endothelial dystrophy   . Hyperlipidemia   . Hypothyroidism   . PONV (postoperative nausea and vomiting)    slow to wake up and PONV    Past Surgical History:  Procedure Laterality Date  . APPENDECTOMY    . BACK SURGERY  09/26/2018   L4-5 PLIF by Dr. Arnoldo Morale  . CARDIAC CATHETERIZATION    . CHOLECYSTECTOMY    . EYE SURGERY    . FOOT SURGERY Right    pin removed left  . laser vein surgery    . NASAL SINUS SURGERY    . RIGHT/LEFT HEART CATH AND CORONARY ANGIOGRAPHY N/A 12/06/2017   Procedure: RIGHT/LEFT HEART CATH AND CORONARY ANGIOGRAPHY;  Surgeon: Yolonda Kida, MD;  Location: [facility];  Service: Cardiovascular;  Laterality: N/A;  . TONSILLECTOMY      There were no vitals filed for this visit.  Subjective Assessment - 09/17/19 1128    Subjective  Pt felt tired after last session. Pt has started treadmill alking for 2-3 minutes to loosen up at the gym.         [facility] PT Assessment - 09/18/19 0806      Posture/Postural Control    Posture Comments  no cues for correcting forward head       6 minute walk test results    Endurance additional comments  1000 ft with ski poles  ( compared to 747 ' without poles on 07/06/19) . reported radiating pain to knee about 50% of the way through 6 MWT) .  four point gait w/ poles with more trunk lean,  Two point gait pattern with hikin g poles, : pt demo'd more thoracic extension and less trunk lean                   [facility] Adult PT Treatment/Exercise - 09/18/19 0806      Therapeutic Activites    Other Therapeutic Activities  gait training with hiking poles for 6 MWT, and from clinic to car        Neuro Re-ed    Neuro Re-ed Details   cued for two point gait with hiking poles for isometric trunk stability on R, cued for anterior COM in seated position and weight bearing on feet , ant pelvic tilt                PT Short Term Goals - 06/08/19  [place] #1   Title  Patient will demonstrate improved pelvic alignment and balance of musculature surrounding the pelvis to facilitate decreased PFM spasms and decrease pelvic pain.    Baseline  Pt. demonstrates severe posterior pelvic tilt, L thoracic/R lumbar scoliosis, and R anterior/L posterior pelvic obliquity. Pt. has demonstrated improved pelvic alignment and posture but has not been able to sustain long-term due to continued lack of postural strength.    Time  5    Period  Weeks    Status  Achieved    Target Date  03/06/19      PT SHORT TERM GOAL #2   Title  Patient will demonstrate a coordinated contraction, relaxation, and bulge of the pelvic floor muscles to demonstrate functional recruitment and motion and allow for bladder emptying.    Baseline  Pt. is unable to fully empty bladder and describes pain with catheterization, demonstrating high tone of PFM.    Time  5    Period  Weeks    Status  Achieved    Target Date  03/06/19      PT SHORT TERM GOAL #3   Title  Patient will demonstrate  improved sitting and standing posture to demonstrate learning and decrease stress on the pelvic floor with functional activity.    Baseline  Pt. demonstrates severe posterior pelvic tilt, L thoracic/R lumbar scoliosis, and R anterior/L posterior pelvic obliquity. 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 Will be able to cath without pain and will be cleared by physiscian to d/c self-cath due to decreased residual volume with bladder emptying.    Baseline  required to cath 3 times per day, painful each time.    Time  10    Period  Weeks    Status  Achieved      PT LONG TERM GOAL #4   Title  Patient will score less than or equal to 40% on the Female NIH-CPSI and 30% on the [facility] to demonstrate a reduction in pain, urinary symptoms, and an improved quality of life.    Baseline  Female NIH-CPSI:  38/43 (88%) , PDI: 45/75 (60%); PDI 44/75 (59%) and Female NIH-CPSI: 27/43 (64%) on 02-08-19 , Female NIH-CPSI: 17% on 03-26-19    Time  10    Period  Weeks    Status  Achieved      PT LONG TERM GOAL #5    Title  Patient will demonstrate increased step length with reciprical arm swing, and demonstrate no scissoring steps.    Baseline  Pt currently demonstrated scissoring steppage on R LE, increased trunk rotation and deep core engagement with reciprical walking poles.    Time  10    Period  Weeks    Status  Achieved      PT LONG TERM GOAL #6   Title  Pt will increase her distance on the 6MWT from 747 feet  to  > 900 feet  order to progress with aerobic exercises and walking longer distances in the community    Time  8    Period  Weeks    Status  On-going      PT LONG TERM GOAL #7   Title  Pt will report waking up > 2 mornings in a row with pain level < 5-6/10 in order to demo maintanence of Tx benefits and more balance of mm tensions achieved with HEP, shoe lift to improve QOL    Baseline  7-8/10 level and benefits from Tx lasted only one morning  (03-19-19 and 03-23-19)    Time  10    Period  Weeks    Status  Partially Met      PT LONG TERM GOAL #8   Title  Pt will be able to demo increased PF strength with unilateral UE support on wall from B 3/5 15 reps in order to achieve stronger push off in gait and walk longer distance    Baseline  B, L 7 reps  2/5, 10 reps R 3/5  ( 12/17: 15 reps R , 13 reps L, 06/01/19: R 20 reps, L 18 reps  )    Time  10    Period  Weeks    Status  Achieved      PT LONG TERM GOAL  #9   TITLE  Pt will demo shoulder abduction > 120 deg B with improved alignment of thorax over pelvis without manual cues in order to demo proper co-activation of B equal weight bearing in BLE, and decreased thoracic kyhposis/ decreased thoracic rotation 2/2 scoliosis in order to lift plates overhead cabinets/ decreased risk for    Time  10    Period  Weeks    Status  Achieved      PT LONG TERM GOAL  #10   TITLE  Pt will demo increased height from 63" to > 63 1/2 " and maintain 63 1/2" across 3 weeks ( once a week measurement) and forward head posture improve ( decreased distance from  wall to earlobe 17 cm from earlobe to wall to > 12 cm)  in order to be ready to progress to PNF exercise with less shoulder pain and lengthen/ strengthen concave curve on R 2/2 scoliosis to minimize L lateral thigh tightness  (07/06/19 : 63 3/4",  08/10/19: 64"   )    Time  8    Period  Weeks    Status  Achieved      PT LONG TERM GOAL  #11   TITLE  Pt will demo single UE support with lunge position exercises/ yoga poses  in order to  demo improved balance and IND with flexibility routine    Baseline  BUE support    Time  10    Period  Weeks    Status  On-going      PT LONG TERM GOAL  #12   TITLE  Pt will demo L trunk rotation in R sidelying with zero report of pain in order to progress with longer walking endurance    Time  10    Period  Weeks    Status  Achieved      PT LONG TERM GOAL  #13   TITLE  Pt will demo increased  shoulder abd R against wall in mini squat from 55 deg to > 75 deg in order to demo increased thoracic extension and intercostal lengthening for functional BUE mobility without compensations.  ( 4/2: 88 deg R )    Time  8    Period  Weeks    Status  Achieved      PT LONG TERM GOAL  #14   TITLE  Pt will demo less forward head posture ( earlobe to wall <13  cm) nd maintain height of 64  1/4" for 1 month in order to stand taller w/ less kyphosis and less deviations to spine to walk longer distances    Baseline  ( earlobe to wall 16 cm)  ( 64  1/14" on 09/14/19  )     Time  10    Period  Weeks    Status  On-going            Plan - 09/18/19 0803    Clinical Impression Statement  Pt demo'd less R trunk lean 2/2 to scoliosis when using 2 point gait pattern with hiking poles. Pt was able to self correct when reverting back to 4 point gait. Pt was able to walk 1000 ft in 6 MWT. Although pt reported tightness to : knee halfway through this distance, anticipate these poles will help pt walk longer distances as pt walked 746 ft  from two months ago 07/06/19 without poles.  Pt  also practiced walking from clinic to car with poles and did not require cues. Pt required cues with review of last week's exercise for more anterior COM and weightbearing in BLE in seated position.  Educated pt to not perform excessive shoulder retraction to minimize increased lumbar lordosis which will help minimize radiculopathy.  Pt continues to benefit from skilled PT    Personal Factors and Comorbidities  Age;Comorbidity 3+    Comorbidities  Osteoporosis, scoliosis, Arthritis, COPD, recent lumbosacral fusion for spondylolisthesis.    Examination-Activity Limitations  Toileting;Sit;Stand;Bend;Lift;Carry    Examination-Participation Restrictions  Interpersonal Relationship;Yard Work;Cleaning;Laundry    Stability/Clinical Decision Making  Unstable/Unpredictable    Rehab Potential  Good    PT Frequency  2x / week    PT Duration  Other (comment)   20   PT Treatment/Interventions  ADLs/Self Care Home Management;Aquatic Therapy;Moist Heat;Electrical Stimulation;Traction;Therapeutic activities;Functional mobility training;Stair training;Gait training;Therapeutic exercise;Balance training;Neuromuscular re-education;Patient/family education;Manual techniques;Dry needling;Passive range of motion;Scar mobilization;Taping    Consulted and Agree with Plan of Care  Patient       Patient will benefit from skilled therapeutic intervention in order to improve the following deficits and impairments:  Abnormal gait, Decreased balance, Increased muscle spasms, Decreased range of motion, Decreased scar mobility, Improper body mechanics, Decreased coordination, Decreased strength, Increased fascial restricitons, Impaired flexibility, Postural dysfunction, Pain  Visit Diagnosis: Abnormal posture  Chronic right shoulder pain  Other muscle spasm  Other idiopathic scoliosis, thoracolumbar region     Problem List Patient Active Problem List   Diagnosis Date Noted  . Chronic UTI 12/06/2018  . Status post  lumbar surgery 12/06/2018  . Spondylolisthesis, lumbar region 09/26/2018  . Aortic atherosclerosis ([place]) 08/31/2018  . CAD (coronary artery disease) 02/23/2018  . Advanced care planning/counseling discussion 03/28/2017  . Varicose veins of both lower extremities with complications 30/94/0768  . Arthritis 12/09/2016  . Vaginal atrophy 09/10/2015  . Menopausal hot flushes 09/10/2015  . Onychomycosis due to dermatophyte 04/07/2015  . Bursitis of right shoulder 02/26/2015  . Environmental and seasonal allergies 02/26/2015  . Other allergic rhinitis 02/26/2015  . Osteoporosis 11/26/2014  . COPD (chronic obstructive pulmonary disease) ([place]) 11/26/2014  . Fuchs' corneal dystrophy 11/26/2014  . Benign hypertensive renal disease 11/26/2014  . Hyperlipidemia 11/26/2014  . Chronic kidney disease, stage III (moderate) ([place]) 11/26/2014  . Hypothyroidism 11/26/2014  . Endothelial corneal dystrophy 11/26/2014    Jerl Mina ,PT, DPT, E-RYT  09/18/2019, 8:10 AM  [place] MAIN [facility] SERVICES [address] [place], [place], [address] Phone: [phone]   Fax:  [phone]  Name: Julia Deleon MRN: [id number] Date of Birth: 07/16/1945

## 2019-09-18 NOTE — Patient Instructions (Signed)
Anterior tilt of pelvis, weight bearing in feet, in seated position   Walking with hiking poles, two point gait pattern

## 2019-09-19 DIAGNOSIS — J301 Allergic rhinitis due to pollen: Secondary | ICD-10-CM | POA: Diagnosis not present

## 2019-09-20 ENCOUNTER — Other Ambulatory Visit: Payer: Self-pay

## 2019-09-20 ENCOUNTER — Ambulatory Visit: Payer: Medicare Other | Admitting: Physical Therapy

## 2019-09-20 DIAGNOSIS — M62838 Other muscle spasm: Secondary | ICD-10-CM | POA: Diagnosis not present

## 2019-09-20 DIAGNOSIS — M4125 Other idiopathic scoliosis, thoracolumbar region: Secondary | ICD-10-CM | POA: Diagnosis not present

## 2019-09-20 DIAGNOSIS — G8929 Other chronic pain: Secondary | ICD-10-CM | POA: Diagnosis not present

## 2019-09-20 DIAGNOSIS — M25511 Pain in right shoulder: Secondary | ICD-10-CM

## 2019-09-20 DIAGNOSIS — R293 Abnormal posture: Secondary | ICD-10-CM | POA: Diagnosis not present

## 2019-09-20 NOTE — Patient Instructions (Signed)
Standing by wall shoulder abduction 30 deg  10 reps   Band under feet  ___  Tug a war to  R small pull Weight shift from L / R with slight tip the teapot motion   10 reps    ___

## 2019-09-20 NOTE — Therapy (Signed)
[place] MAIN [facility] SERVICES [address] [place], [place], [address] Phone: [phone]   Fax:  [phone]  Physical Therapy Treatment  Patient Details  Name: Julia Deleon MRN: [id number] Date of Birth: 1945/05/26 Referring Provider (PT): Dr. Maryan Puls   Encounter Date: 09/20/2019  PT End of Session - 09/20/19 1633    Visit Number  75    Date for PT Re-Evaluation  11/23/19   Progress note to report #84 (last on #84  09/14/19)   Authorization Type  Medicare    PT Start Time  8811    PT Stop Time  1500    PT Time Calculation (min)  43 min    Activity Tolerance  Patient tolerated treatment well;No increased pain    Behavior During Therapy  WFL for tasks assessed/performed       Past Medical History:  Diagnosis Date  . Arthritis   . COPD (chronic obstructive pulmonary disease) ([place])   . Family history of adverse reaction to anesthesia    father was slow to wake up  . Fuch's endothelial dystrophy   . Hyperlipidemia   . Hypothyroidism   . PONV (postoperative nausea and vomiting)    slow to wake up and PONV    Past Surgical History:  Procedure Laterality Date  . APPENDECTOMY    . BACK SURGERY  09/26/2018   L4-5 PLIF by Dr. Arnoldo Morale  . CARDIAC CATHETERIZATION    . CHOLECYSTECTOMY    . EYE SURGERY    . FOOT SURGERY Right    pin removed left  . laser vein surgery    . NASAL SINUS SURGERY    . RIGHT/LEFT HEART CATH AND CORONARY ANGIOGRAPHY N/A 12/06/2017   Procedure: RIGHT/LEFT HEART CATH AND CORONARY ANGIOGRAPHY;  Surgeon: Yolonda Kida, MD;  Location: [facility];  Service: Cardiovascular;  Laterality: N/A;  . TONSILLECTOMY      There were no vitals filed for this visit.  Subjective Assessment - 09/20/19 1627    Subjective  Pt hated using the hiking poles at last session         [facility] PT Assessment - 09/20/19 1629      Coordination   Fine Motor Movements are Fluid and Coordinated  --   no cues for  weightbearing into feet in standing      Palpation   Palpation comment  paraspinals, intercostals T10 L tightness                     [facility] Adult PT Treatment/Exercise - 09/20/19 1627      Manual Therapy   Manual therapy comments  paraspinals, intercostals T10 L STM                PT Short Term Goals - 06/08/19 1133      PT SHORT TERM GOAL #1   Title  Patient will demonstrate improved pelvic alignment and balance of musculature surrounding the pelvis to facilitate decreased PFM spasms and decrease pelvic pain.    Baseline  Pt. demonstrates severe posterior pelvic tilt, L thoracic/R lumbar scoliosis, and R anterior/L posterior pelvic obliquity. Pt. has demonstrated improved pelvic alignment and posture but has not been able to sustain long-term due to continued lack of postural strength.    Time  5    Period  Weeks    Status  Achieved    Target Date  03/06/19      PT SHORT TERM GOAL #2  Title  Patient will demonstrate a coordinated contraction, relaxation, and bulge of the pelvic floor muscles to demonstrate functional recruitment and motion and allow for bladder emptying.    Baseline  Pt. is unable to fully empty bladder and describes pain with catheterization, demonstrating high tone of PFM.    Time  5    Period  Weeks    Status  Achieved    Target Date  03/06/19      PT SHORT TERM GOAL #3   Title  Patient will demonstrate improved sitting and standing posture to demonstrate learning and decrease stress on the pelvic floor with functional activity.    Baseline  Pt. demonstrates severe posterior pelvic tilt, L thoracic/R lumbar scoliosis, and R anterior/L posterior pelvic obliquity. 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 Will be able to cath without pain and will be cleared by physiscian to d/c self-cath due to decreased residual volume with bladder emptying.    Baseline  required to cath 3 times per day, painful each time.    Time  10    Period  Weeks    Status  Achieved      PT LONG TERM GOAL #4   Title  Patient will score less than or equal to 40% on the Female NIH-CPSI and 30% on the [facility] to demonstrate a reduction in pain, urinary symptoms, and an improved quality of life.    Baseline  Female NIH-CPSI: 38/43 (88%) , PDI: 45/75 (60%); PDI 44/75 (59%) and Female NIH-CPSI: 27/43 (64%) on 02-08-19 , Female NIH-CPSI: 17% on 03-26-19    Time  10    Period  Weeks    Status  Achieved      PT LONG TERM GOAL #5   Title  Patient will demonstrate increased step length with reciprical arm swing, and demonstrate no scissoring steps.    Baseline  Pt currently demonstrated scissoring steppage on R LE, increased trunk rotation and deep core engagement with reciprical walking poles.    Time  10    Period  Weeks    Status  Achieved      PT LONG  TERM GOAL #6   Title  Pt will increase her distance on the 6MWT from 747 feet  to  > 900 feet  order to progress with aerobic exercises and walking longer distances in the community    Time  8    Period  Weeks     Status  On-going      PT LONG TERM GOAL #7   Title  Pt will report waking up > 2 mornings in a row with pain level < 5-6/10 in order to demo maintanence of Tx benefits and more balance of mm tensions achieved with HEP, shoe lift to improve QOL    Baseline  7-8/10 level and benefits from Tx lasted only one morning  (03-19-19 and 03-23-19)    Time  10    Period  Weeks    Status  Partially Met      PT LONG TERM GOAL #8   Title  Pt will be able to demo increased PF strength with unilateral UE support on wall from B 3/5 15 reps in order to achieve stronger push off in gait and walk longer distance    Baseline  B, L 7 reps  2/5, 10 reps R 3/5  ( 12/17: 15 reps R , 13 reps L, 06/01/19: R 20 reps, L 18 reps  )    Time  10    Period  Weeks    Status  Achieved      PT LONG TERM GOAL  #9   TITLE  Pt will demo shoulder abduction > 120 deg B with improved alignment of thorax over pelvis without manual cues in order to demo proper co-activation of B equal weight bearing in BLE, and decreased thoracic kyhposis/ decreased thoracic rotation 2/2 scoliosis in order to lift plates overhead cabinets/ decreased risk for    Time  10    Period  Weeks    Status  Achieved      PT LONG TERM GOAL  #10   TITLE  Pt will demo increased height from 63" to > 63 1/2 " and maintain 63 1/2" across 3 weeks ( once a week measurement) and forward head posture improve ( decreased distance from wall to earlobe 17 cm from earlobe to wall to > 12 cm)  in order to be ready to progress to PNF exercise with less shoulder pain and lengthen/ strengthen concave curve on R 2/2 scoliosis to minimize L lateral thigh tightness  (07/06/19 : 63 3/4",  08/10/19: 64"   )    Time  8    Period  Weeks    Status  Achieved      PT LONG TERM GOAL  #11   TITLE  Pt will demo single UE support with lunge position exercises/ yoga poses  in order to demo improved balance and IND with flexibility routine    Baseline  BUE support    Time  10    Period  Weeks     Status  On-going      PT LONG TERM GOAL  #12   TITLE  Pt will demo L trunk rotation in R sidelying with zero report of pain in order to progress with longer walking endurance    Time  10    Period  Weeks    Status  Achieved      PT LONG TERM GOAL  #13   TITLE  Pt will demo increased  shoulder abd R against wall in mini squat from 55  deg to > 75 deg in order to demo increased thoracic extension and intercostal lengthening for functional BUE mobility without compensations.  ( 4/2: 88 deg R )    Time  8    Period  Weeks    Status  Achieved      PT LONG TERM GOAL  #14   TITLE  Pt will demo less forward head posture ( earlobe to wall <13  cm) nd maintain height of 64  1/4" for 1 month in order to stand taller w/ less kyphosis and less deviations to spine to walk longer distances    Baseline  ( earlobe to wall 16 cm)  ( 64  1/14" on 09/14/19  )     Time  10    Period  Weeks    Status  On-going            Plan - 09/20/19 1633    Clinical Impression Statement  Pt did not require cues for weightbearing and spinal lengthening today. Pt's L intercostals, paraspinal area of tightness has diminished to a smaller area. Advanced pt to shoulder abduction 30 deg and pulling motion to R with resistance band to strengthen RTC, promote scapular retraction,  intercostal eccentric strengthening tand weight shifts in BLE to address concavity of R curve in pt's scoliosis. Plan to use hiking poles again at upcoming sessions for gait training. Pt continues to benefit from skilled PT.    Personal Factors and Comorbidities  Age;Comorbidity 3+    Comorbidities  Osteoporosis, scoliosis, Arthritis, COPD, recent lumbosacral fusion for spondylolisthesis.    Examination-Activity Limitations  Toileting;Sit;Stand;Bend;Lift;Carry    Examination-Participation Restrictions  Interpersonal Relationship;Yard Work;Cleaning;Laundry    Stability/Clinical Decision Making  Unstable/Unpredictable    Rehab Potential  Good     PT Frequency  2x / week    PT Duration  Other (comment)   20   PT Treatment/Interventions  ADLs/Self Care Home Management;Aquatic Therapy;Moist Heat;Electrical Stimulation;Traction;Therapeutic activities;Functional mobility training;Stair training;Gait training;Therapeutic exercise;Balance training;Neuromuscular re-education;Patient/family education;Manual techniques;Dry needling;Passive range of motion;Scar mobilization;Taping    Consulted and Agree with Plan of Care  Patient       Patient will benefit from skilled therapeutic intervention in order to improve the following deficits and impairments:  Abnormal gait, Decreased balance, Increased muscle spasms, Decreased range of motion, Decreased scar mobility, Improper body mechanics, Decreased coordination, Decreased strength, Increased fascial restricitons, Impaired flexibility, Postural dysfunction, Pain  Visit Diagnosis: Chronic right shoulder pain  Abnormal posture  Other muscle spasm  Other idiopathic scoliosis, thoracolumbar region     Problem List Patient Active Problem List   Diagnosis Date Noted  . Chronic UTI 12/06/2018  . Status post lumbar surgery 12/06/2018  . Spondylolisthesis, lumbar region 09/26/2018  . Aortic atherosclerosis ([place]) 08/31/2018  . CAD (coronary artery disease) 02/23/2018  . Advanced care planning/counseling discussion 03/28/2017  . Varicose veins of both lower extremities with complications 11/94/1740  . Arthritis 12/09/2016  . Vaginal atrophy 09/10/2015  . Menopausal hot flushes 09/10/2015  . Onychomycosis due to dermatophyte 04/07/2015  . Bursitis of right shoulder 02/26/2015  . Environmental and seasonal allergies 02/26/2015  . Other allergic rhinitis 02/26/2015  . Osteoporosis 11/26/2014  . COPD (chronic obstructive pulmonary disease) ([place]) 11/26/2014  . Fuchs' corneal dystrophy 11/26/2014  . Benign hypertensive renal disease 11/26/2014  . Hyperlipidemia 11/26/2014  . Chronic kidney  disease, stage III (moderate) ([place]) 11/26/2014  . Hypothyroidism 11/26/2014  . Endothelial corneal dystrophy 11/26/2014    Jerl Mina ,PT, DPT, E-RYT  09/20/2019, 4:43  PM  [place] MAIN [facility] SERVICES [address] [place], [place], [address] Phone: [phone]   Fax:  [phone]  Name: Lorra C Wardell MRN: [id number] Date of Birth: 08-03-1945

## 2019-09-21 ENCOUNTER — Ambulatory Visit
Admission: RE | Admit: 2019-09-21 | Discharge: 2019-09-21 | Disposition: A | Discharge status: Home / Self Care | Payer: Medicare Other | Source: Ambulatory Visit | Attending: Neurosurgery | Admitting: Neurosurgery

## 2019-09-21 ENCOUNTER — Other Ambulatory Visit: Payer: Medicare Other

## 2019-09-21 ENCOUNTER — Other Ambulatory Visit: Payer: Self-pay

## 2019-09-21 DIAGNOSIS — M5432 Sciatica, left side: Secondary | ICD-10-CM

## 2019-09-21 DIAGNOSIS — M5126 Other intervertebral disc displacement, lumbar region: Secondary | ICD-10-CM | POA: Diagnosis not present

## 2019-09-21 DIAGNOSIS — M4316 Spondylolisthesis, lumbar region: Secondary | ICD-10-CM | POA: Diagnosis not present

## 2019-09-21 MED ORDER — DIAZEPAM 5 MG PO TABS
5.0000 mg | ORAL_TABLET | Freq: Once | ORAL | Status: AC
  Administered 2019-09-21: 5 mg via ORAL

## 2019-09-21 MED ORDER — IOPAMIDOL (ISOVUE-M 200) INJECTION 41%
20.0000 mL | Freq: Once | INTRAMUSCULAR | Status: AC
  Administered 2019-09-21: 20 mL via INTRATHECAL

## 2019-09-21 NOTE — Discharge Instructions (Signed)

## 2019-09-25 DIAGNOSIS — I83893 Varicose veins of bilateral lower extremities with other complications: Secondary | ICD-10-CM | POA: Diagnosis not present

## 2019-09-25 DIAGNOSIS — I10 Essential (primary) hypertension: Secondary | ICD-10-CM | POA: Diagnosis not present

## 2019-09-25 DIAGNOSIS — Z6833 Body mass index (BMI) 33.0-33.9, adult: Secondary | ICD-10-CM | POA: Diagnosis not present

## 2019-09-25 DIAGNOSIS — E782 Mixed hyperlipidemia: Secondary | ICD-10-CM | POA: Diagnosis not present

## 2019-09-25 DIAGNOSIS — R5383 Other fatigue: Secondary | ICD-10-CM | POA: Diagnosis not present

## 2019-09-25 DIAGNOSIS — I208 Other forms of angina pectoris: Secondary | ICD-10-CM | POA: Diagnosis not present

## 2019-09-25 DIAGNOSIS — N183 Chronic kidney disease, stage 3 unspecified: Secondary | ICD-10-CM | POA: Diagnosis not present

## 2019-09-25 DIAGNOSIS — J439 Emphysema, unspecified: Secondary | ICD-10-CM | POA: Diagnosis not present

## 2019-09-25 DIAGNOSIS — R0602 Shortness of breath: Secondary | ICD-10-CM | POA: Diagnosis not present

## 2019-09-25 DIAGNOSIS — E6609 Other obesity due to excess calories: Secondary | ICD-10-CM | POA: Diagnosis not present

## 2019-09-25 DIAGNOSIS — R600 Localized edema: Secondary | ICD-10-CM | POA: Diagnosis not present

## 2019-09-26 DIAGNOSIS — Z6825 Body mass index (BMI) 25.0-25.9, adult: Secondary | ICD-10-CM | POA: Diagnosis not present

## 2019-09-26 DIAGNOSIS — M5432 Sciatica, left side: Secondary | ICD-10-CM | POA: Diagnosis not present

## 2019-09-26 DIAGNOSIS — R030 Elevated blood-pressure reading, without diagnosis of hypertension: Secondary | ICD-10-CM | POA: Diagnosis not present

## 2019-09-27 ENCOUNTER — Encounter: Payer: Self-pay | Admitting: Family Medicine

## 2019-09-27 ENCOUNTER — Other Ambulatory Visit: Payer: Self-pay

## 2019-09-27 ENCOUNTER — Ambulatory Visit (INDEPENDENT_AMBULATORY_CARE_PROVIDER_SITE_OTHER): Payer: Medicare Other | Admitting: Family Medicine

## 2019-09-27 VITALS — BP 135/66 | HR 62 | Temp 97.6°F | Ht 64.25 in | Wt 158.2 lb

## 2019-09-27 DIAGNOSIS — I129 Hypertensive chronic kidney disease with stage 1 through stage 4 chronic kidney disease, or unspecified chronic kidney disease: Secondary | ICD-10-CM

## 2019-09-27 DIAGNOSIS — J449 Chronic obstructive pulmonary disease, unspecified: Secondary | ICD-10-CM | POA: Diagnosis not present

## 2019-09-27 DIAGNOSIS — E038 Other specified hypothyroidism: Secondary | ICD-10-CM | POA: Diagnosis not present

## 2019-09-27 DIAGNOSIS — I10 Essential (primary) hypertension: Secondary | ICD-10-CM | POA: Diagnosis not present

## 2019-09-27 DIAGNOSIS — I700 Atherosclerosis of aorta: Secondary | ICD-10-CM | POA: Diagnosis not present

## 2019-09-27 DIAGNOSIS — K644 Residual hemorrhoidal skin tags: Secondary | ICD-10-CM

## 2019-09-27 DIAGNOSIS — N183 Chronic kidney disease, stage 3 unspecified: Secondary | ICD-10-CM | POA: Diagnosis not present

## 2019-09-27 DIAGNOSIS — E785 Hyperlipidemia, unspecified: Secondary | ICD-10-CM

## 2019-09-27 LAB — MICROALBUMIN, URINE WAIVED
Creatinine, Urine Waived: 100 mg/dL (ref 10–300)
Microalb, Ur Waived: 10 mg/L (ref 0–19)
Microalb/Creat Ratio: <30 mg/g (ref <30)

## 2019-09-27 MED ORDER — LOSARTAN POTASSIUM 100 MG PO TABS: 100.0000 mg | ORAL_TABLET | Freq: Every day | ORAL | 1 refills | Status: DC

## 2019-09-27 NOTE — Patient Instructions (Signed)
Nonsurgical Procedures for Hemorrhoids  Nonsurgical procedures can be used to treat hemorrhoids. Hemorrhoids are swollen veins that are inside the rectum (internal hemorrhoids) or around the anus (external hemorrhoids). They are caused by increased pressure in the anal area. This pressure may result from straining to have a bowel movement (constipation), diarrhea, pregnancy, obesity, anal sex, or sitting for long periods of time. Hemorrhoids can cause symptoms such as pain and bleeding. Various procedures may be done if diet changes, lifestyle changes, and other home treatments do not help your symptoms. Some of these procedures do not involve surgery. Tell a health care provider about:  Any allergies you have.  All medicines you are taking, including vitamins, herbs, eye drops, creams, and over-the-counter medicines.  Any problems you or family members have had with anesthetic medicines.  Any blood disorders you have.  Any surgeries you have had.  Any medical conditions you have.  Whether you are pregnant or may be pregnant. What are the risks? Generally, this is a safe procedure. However, problems may occur, including:  Infection.  Bleeding.  Pain. What happens before the procedure?  Ask your health care provider about: ? Changing or stopping your regular medicines. This is especially important if you are taking diabetes medicines or blood thinners. ? Taking medicines such as aspirin and ibuprofen. These medicines can thin your blood. Do not take these medicines unless your health care provider tells you to take them. ? Taking over-the-counter medicines, vitamins, herbs, and supplements.  Follow instructions from your health care provider about eating or drinking restrictions.  You may need to have a procedure to examine the inside of your colon with a scope (colonoscopy). Your health care provider may do this to make sure that there are no other causes for your bleeding or  pain. What happens during the procedure?   Your health care provider will clean your rectal area with a rinsing solution.  A lubricating jelly may be placed into your rectum. The jelly may contain a medicine to numb the area (local anesthetic).  Your health care provider will insert a short scope (anoscope) into your rectum to examine the hemorrhoids.  One of the following techniques will be used: ? Rubber band ligation. Your health care provider will place medical instruments through the scope to put rubber bands around the base of your hemorrhoids. The bands will cut off the blood supply to the hemorrhoids. The hemorrhoids will fall off after several days. ? Sclerotherapy. Your health care provider will inject medicine through the scope into your hemorrhoids. This will cause them to shrink and dry up. ? Infrared coagulation. Your health care provider will shine a type of light through the scope onto your hemorrhoids. This light will generate energy (infrared radiation). It will cause the hemorrhoids to scar and then fall off. Each of these procedures may vary among health care providers and hospitals. What happens after the procedure?  You will be monitored to make sure that you have no bleeding.  Return to your normal activities as told by your health care provider. Summary  Hemorrhoids are swollen veins that are inside the rectum (internal hemorrhoids) or around the anus (external hemorrhoids).  Nonsurgical procedures can be used to treat hemorrhoids.  Rubber band ligation, sclerotherapy, or infrared coagulation may be used if dietary and lifestyle changes do not cause your hemorrhoids to go away.  Before the procedure, ask your health care provider about changing or stopping your regular medicines. This information is not intended to  replace advice given to you by your health care provider. Make sure you discuss any questions you have with your health care provider. Document  Revised: 10/04/2018 Document Reviewed: 10/17/2017 Elsevier Patient Education  [place].

## 2019-09-27 NOTE — Progress Notes (Signed)
BP 135/66 (BP Location: Left Arm, Patient Position: Sitting, Cuff Size: Normal)   Pulse 62   Temp 97.6 F (36.4 C) (Oral)   Ht 5' 4.25" (1.632 m)   Wt 158 lb 3.2 oz (71.8 kg)   LMP  (LMP Unknown)   SpO2 100%   BMI 26.94 kg/m    Subjective:    Patient ID: Julia Deleon, female    DOB: [birth year], 74 y.o.   MRN: [id number]  HPI: Julia Deleon is a 74 y.o. female  Chief Complaint  Patient presents with  . Hyperlipidemia  . Hypertension  . COPD   Having pain in her L leg. Following with neurosurgery. Developed external hemorrhoids in 2013 when she had her colonoscopy. After her back surgery they have gotten worse. She is concerned that her hemorrhoids are causing some of her UTIs as she cannot keep her clean.   HYPERTENSION / HYPERLIPIDEMIA Satisfied with current treatment? yes Duration of hypertension: chronic BP monitoring frequency: not checking BP medication side effects: no Past BP meds: losartan Duration of hyperlipidemia: chronic Cholesterol medication side effects: not on anything Cholesterol supplements: fish oil Past cholesterol medications: none Medication compliance: excellent compliance Aspirin: no Recent stressors: no Recurrent headaches: no Visual changes: no Palpitations: no Dyspnea: no Chest pain: no Lower extremity edema: no Dizzy/lightheaded: no  HYPOTHYROIDISM Thyroid control status:controlled Satisfied with current treatment? yes Medication side effects: no Medication compliance: excellent compliance Recent dose adjustment:no Fatigue: no Cold intolerance: no Heat intolerance: no Weight gain: no Weight loss: no Constipation: no Diarrhea/loose stools: no Palpitations: no Lower extremity edema: no Anxiety/depressed mood: no  Relevant past medical, surgical, family and social history reviewed and updated as indicated. Interim medical history since our last visit reviewed. Allergies and medications reviewed and updated.  Review of Systems    Constitutional: Negative.   Respiratory: Negative.   Cardiovascular: Negative.   Musculoskeletal: Positive for back pain and myalgias. Negative for arthralgias, gait problem, joint swelling, neck pain and neck stiffness.  Psychiatric/Behavioral: Negative.     Per HPI unless specifically indicated above     Objective:    BP 135/66 (BP Location: Left Arm, Patient Position: Sitting, Cuff Size: Normal)   Pulse 62   Temp 97.6 F (36.4 C) (Oral)   Ht 5' 4.25" (1.632 m)   Wt 158 lb 3.2 oz (71.8 kg)   LMP  (LMP Unknown)   SpO2 100%   BMI 26.94 kg/m   Wt Readings from Last 3 Encounters:  09/27/19 158 lb 3.2 oz (71.8 kg)  05/30/19 161 lb (73 kg)  05/07/19 161 lb (73 kg)    Physical Exam Vitals and nursing note reviewed.  Constitutional:      General: She is not in acute distress.    Appearance: Normal appearance. She is not ill-appearing, toxic-appearing or diaphoretic.  HENT:     Head: Normocephalic and atraumatic.     Right Ear: External ear normal.     Left Ear: External ear normal.     Nose: Nose normal.     Mouth/Throat:     Mouth: Mucous membranes are moist.     Pharynx: Oropharynx is clear.  Eyes:     General: No scleral icterus.       Right eye: No discharge.        Left eye: No discharge.     Extraocular Movements: Extraocular movements intact.     Conjunctiva/sclera: Conjunctivae normal.     Pupils: Pupils are equal, round, and reactive to  light.  Cardiovascular:     Rate and Rhythm: Normal rate and regular rhythm.     Pulses: Normal pulses.     Heart sounds: Normal heart sounds. No murmur. No friction rub. No gallop.   Pulmonary:     Effort: Pulmonary effort is normal. No respiratory distress.     Breath sounds: Normal breath sounds. No stridor. No wheezing, rhonchi or rales.  Chest:     Chest wall: No tenderness.  Musculoskeletal:        General: Normal range of motion.     Cervical back: Normal range of motion and neck supple.  Skin:    General: Skin  is warm and dry.     Capillary Refill: Capillary refill takes less than 2 seconds.     Coloration: Skin is not jaundiced or pale.     Findings: No bruising, erythema, lesion or rash.  Neurological:     General: No focal deficit present.     Mental Status: She is alert and oriented to person, place, and time. Mental status is at baseline.  Psychiatric:        Mood and Affect: Mood normal.        Behavior: Behavior normal.        Thought Content: Thought content normal.        Judgment: Judgment normal.     Results for orders placed or performed in visit on 09/27/19  CBC with Differential/Platelet  Result Value Ref Range   WBC 7.0 3.4 - 10.8 x10E3/uL   RBC 4.28 3.77 - 5.28 x10E6/uL   Hemoglobin 13.2 11.1 - 15.9 g/dL   Hematocrit 40.5 34.0 - 46.6 %   MCV 95 79 - 97 fL   MCH 30.8 26.6 - 33.0 pg   MCHC 32.6 31.5 - 35.7 g/dL   RDW 12.2 11.7 - 15.4 %   Platelets 195 150 - 450 x10E3/uL   Neutrophils 56 Not Estab. %   Lymphs 32 Not Estab. %   Monocytes 8 Not Estab. %   Eos 2 Not Estab. %   Basos 1 Not Estab. %   Neutrophils Absolute 3.9 1.4 - 7.0 x10E3/uL   Lymphocytes Absolute 2.3 0.7 - 3.1 x10E3/uL   Monocytes Absolute 0.6 0.1 - 0.9 x10E3/uL   EOS (ABSOLUTE) 0.2 0.0 - 0.4 x10E3/uL   Basophils Absolute 0.1 0.0 - 0.2 x10E3/uL   Immature Granulocytes 1 Not Estab. %   Immature Grans (Abs) 0.1 0.0 - 0.1 x10E3/uL  Comprehensive metabolic panel  Result Value Ref Range   Glucose 65 65 - 99 mg/dL   BUN 25 8 - 27 mg/dL   Creatinine, Ser 1.14 (H) 0.57 - 1.00 mg/dL   GFR calc non Af Amer 48 (L) >59 mL/min/1.73   GFR calc Af Amer 56 (L) >59 mL/min/1.73   BUN/Creatinine Ratio 22 12 - 28   Sodium 139 134 - 144 mmol/L   Potassium 4.2 3.5 - 5.2 mmol/L   Chloride 104 96 - 106 mmol/L   CO2 23 20 - 29 mmol/L   Calcium 9.1 8.7 - 10.3 mg/dL   Total Protein 6.3 6.0 - 8.5 g/dL   Albumin 4.2 3.7 - 4.7 g/dL   Globulin, Total 2.1 1.5 - 4.5 g/dL   Albumin/Globulin Ratio 2.0 1.2 - 2.2   Bilirubin  Total 0.6 0.0 - 1.2 mg/dL   Alkaline Phosphatase 59 48 - 121 IU/L   AST 11 0 - 40 IU/L   ALT 15 0 - 32 IU/L  Microalbumin, Urine  Waived  Result Value Ref Range   Microalb, Ur Waived 10 0 - 19 mg/L   Creatinine, Urine Waived 100 10 - 300 mg/dL   Microalb/Creat Ratio <30 <30 mg/g  TSH  Result Value Ref Range   TSH 3.090 0.450 - 4.500 uIU/mL      Assessment & Plan:   Problem List Items Addressed This Visit      Cardiovascular and Mediastinum   Aortic atherosclerosis ([place])    Will keep BP and cholesterol under good control. Continue to monitor. Call with any concerns.       Relevant Medications   losartan (COZAAR) 100 MG tablet     Respiratory   COPD (chronic obstructive pulmonary disease) ([place])    Under good control on current regimen. Continue current regimen. Continue to monitor. Call with any concerns. Refills given. Labs drawn today.       Relevant Orders   CBC with Differential/Platelet (Completed)   Comprehensive metabolic panel (Completed)     Endocrine   Hypothyroidism - Primary    Rechecking levels today. Will await results and treat as needed.       Relevant Medications   levothyroxine (SYNTHROID) 75 MCG tablet   Other Relevant Orders   Comprehensive metabolic panel (Completed)   TSH (Completed)     Genitourinary   Benign hypertensive renal disease    Under good control on current regimen. Continue current regimen. Continue to monitor. Call with any concerns. Refills given. Labs drawn today.      Relevant Medications   losartan (COZAAR) 100 MG tablet   Other Relevant Orders   CBC with Differential/Platelet (Completed)   Comprehensive metabolic panel (Completed)   Microalbumin, Urine Waived (Completed)   Chronic kidney disease, stage III (moderate) ([place])    Rechecking labs today. Await results. Treat as needed.       Relevant Orders   CBC with Differential/Platelet (Completed)   Comprehensive metabolic panel (Completed)     Other    Hyperlipidemia    Under good control on current regimen. Continue current regimen. Continue to monitor. Call with any concerns. Refills given. Labs drawn today.      Relevant Medications   losartan (COZAAR) 100 MG tablet   Other Relevant Orders   Comprehensive metabolic panel (Completed)   Lipid Panel w/o Chol/HDL Ratio    Other Visit Diagnoses    External hemorrhoids       Discussed getting her in to discuss banding. She will hold at this time and let us know if she changes her mind.    Relevant Medications   losartan (COZAAR) 100 MG tablet   Essential hypertension       Relevant Medications   losartan (COZAAR) 100 MG tablet       Follow up plan: Return in about 6 months (around 03/29/2020) for wellness, follow up.

## 2019-09-28 LAB — CBC WITH DIFFERENTIAL/PLATELET
Basophils Absolute: 0.1 10*3/uL (ref 0.0–0.2)
Basos: 1 %
EOS (ABSOLUTE): 0.2 10*3/uL (ref 0.0–0.4)
Eos: 2 %
Hematocrit: 40.5 % (ref 34.0–46.6)
Hemoglobin: 13.2 g/dL (ref 11.1–15.9)
Immature Grans (Abs): 0.1 10*3/uL (ref 0.0–0.1)
Immature Granulocytes: 1 %
Lymphocytes Absolute: 2.3 10*3/uL (ref 0.7–3.1)
Lymphs: 32 %
MCH: 30.8 pg (ref 26.6–33.0)
MCHC: 32.6 g/dL (ref 31.5–35.7)
MCV: 95 fL (ref 79–97)
Monocytes Absolute: 0.6 10*3/uL (ref 0.1–0.9)
Monocytes: 8 %
Neutrophils Absolute: 3.9 10*3/uL (ref 1.4–7.0)
Neutrophils: 56 %
Platelets: 195 10*3/uL (ref 150–450)
RBC: 4.28 x10E6/uL (ref 3.77–5.28)
RDW: 12.2 % (ref 11.7–15.4)
WBC: 7 10*3/uL (ref 3.4–10.8)

## 2019-09-28 LAB — COMPREHENSIVE METABOLIC PANEL
ALT: 15 IU/L (ref 0–32)
AST: 11 IU/L (ref 0–40)
Albumin/Globulin Ratio: 2 (ref 1.2–2.2)
Albumin: 4.2 g/dL (ref 3.7–4.7)
Alkaline Phosphatase: 59 IU/L (ref 48–121)
BUN/Creatinine Ratio: 22 (ref 12–28)
BUN: 25 mg/dL (ref 8–27)
Bilirubin Total: 0.6 mg/dL (ref 0.0–1.2)
CO2: 23 mmol/L (ref 20–29)
Calcium: 9.1 mg/dL (ref 8.7–10.3)
Chloride: 104 mmol/L (ref 96–106)
Creatinine, Ser: 1.14 mg/dL — ABNORMAL HIGH (ref 0.57–1.00)
GFR calc Af Amer: 56 mL/min/{1.73_m2} — ABNORMAL LOW (ref >59)
GFR calc non Af Amer: 48 mL/min/{1.73_m2} — ABNORMAL LOW (ref >59)
Globulin, Total: 2.1 g/dL (ref 1.5–4.5)
Glucose: 65 mg/dL (ref 65–99)
Potassium: 4.2 mmol/L (ref 3.5–5.2)
Sodium: 139 mmol/L (ref 134–144)
Total Protein: 6.3 g/dL (ref 6.0–8.5)

## 2019-09-28 LAB — TSH: TSH: 3.09 u[IU]/mL (ref 0.450–4.500)

## 2019-09-30 MED ORDER — LEVOTHYROXINE SODIUM 75 MCG PO TABS: ORAL_TABLET | ORAL | 3 refills | Status: DC

## 2019-09-30 NOTE — Assessment & Plan Note (Signed)
Rechecking levels today. Will await results and treat as needed.

## 2019-09-30 NOTE — Assessment & Plan Note (Signed)
Under good control on current regimen. Continue current regimen. Continue to monitor. Call with any concerns. Refills given. Labs drawn today.   

## 2019-09-30 NOTE — Assessment & Plan Note (Signed)
Rechecking labs today. Await results. Treat as needed.  °

## 2019-09-30 NOTE — Assessment & Plan Note (Signed)
Will keep BP and cholesterol under good control. Continue to monitor. Call with any concerns.  

## 2019-10-02 ENCOUNTER — Other Ambulatory Visit: Payer: Self-pay

## 2019-10-02 ENCOUNTER — Ambulatory Visit: Payer: Medicare Other | Admitting: Physical Therapy

## 2019-10-02 ENCOUNTER — Ambulatory Visit: Payer: Medicare Other | Admitting: Family Medicine

## 2019-10-02 ENCOUNTER — Other Ambulatory Visit: Payer: Medicare Other

## 2019-10-02 DIAGNOSIS — G8929 Other chronic pain: Secondary | ICD-10-CM

## 2019-10-02 DIAGNOSIS — M4125 Other idiopathic scoliosis, thoracolumbar region: Secondary | ICD-10-CM

## 2019-10-02 DIAGNOSIS — R293 Abnormal posture: Secondary | ICD-10-CM

## 2019-10-02 DIAGNOSIS — E785 Hyperlipidemia, unspecified: Secondary | ICD-10-CM

## 2019-10-02 DIAGNOSIS — M62838 Other muscle spasm: Secondary | ICD-10-CM

## 2019-10-02 DIAGNOSIS — M25511 Pain in right shoulder: Secondary | ICD-10-CM

## 2019-10-02 NOTE — Therapy (Signed)
[place] MAIN [facility] SERVICES [address] [place], [place], [address] Phone: [phone]   Fax:  [phone]  Physical Therapy Treatment  Patient Details  Name: Julia Deleon MRN: [id number] Date of Birth: 29-Sep-1945 Referring Provider (PT): Dr. Maryan Puls   Encounter Date: 10/02/2019  PT End of Session - 10/02/19 1312    Visit Number  [id number]    Date for PT Re-Evaluation  11/23/19   Progress note to report #84 (last on #84  09/14/19)   Authorization Type  Medicare    PT Start Time  6269    PT Stop Time  1358    PT Time Calculation (min)  53 min    Activity Tolerance  Patient tolerated treatment well;No increased pain    Behavior During Therapy  WFL for tasks assessed/performed       Past Medical History:  Diagnosis Date  . Arthritis   . COPD (chronic obstructive pulmonary disease) ([place])   . Family history of adverse reaction to anesthesia    father was slow to wake up  . Fuch's endothelial dystrophy   . Hyperlipidemia   . Hypothyroidism   . PONV (postoperative nausea and vomiting)    slow to wake up and PONV    Past Surgical History:  Procedure Laterality Date  . APPENDECTOMY    . BACK SURGERY  09/26/2018   L4-5 PLIF by Dr. Arnoldo Morale  . CARDIAC CATHETERIZATION    . CHOLECYSTECTOMY    . EYE SURGERY    . FOOT SURGERY Right    pin removed left  . laser vein surgery    . NASAL SINUS SURGERY    . RIGHT/LEFT HEART CATH AND CORONARY ANGIOGRAPHY N/A 12/06/2017   Procedure: RIGHT/LEFT HEART CATH AND CORONARY ANGIOGRAPHY;  Surgeon: Yolonda Kida, MD;  Location: [facility];  Service: Cardiovascular;  Laterality: N/A;  . TONSILLECTOMY      There were no vitals filed for this visit.  Subjective Assessment - 10/02/19 1308    Subjective  Pt reported she saw Dr. Arnoldo Morale who informed her that there is inflammation in the spinal region and recommended cortisone shot. Pt is still deciding on whether to get these shots or not.          [facility] PT Assessment - 10/02/19 1313      Palpation   Spinal mobility  less rotational componet to scoliosis    Palpation comment   hypomobileT7-10 , subscapularis R      Ambulation/Gait   Pre-Gait Activities  16 laps w/ hiking pole384 ft s in 6 min  with report of "lilttle shooting pain to knee but not grossly"     Gait Comments  less trunk lean with hiking poles bilaterally versus 3 point pattern                      [facility] Adult PT Treatment/Exercise - 10/02/19 1313      Therapeutic Activites    Other Therapeutic Activities  gait training       Neuro Re-ed    Neuro Re-ed Details   cued for walking with hiking poles to promote less trunk lean       Moist Heat Therapy   Number Minutes Moist Heat  5 Minutes    Moist Heat Location  --   thoracic      Manual Therapy   Manual therapy comments  PA mob Grade III T7-10 , STM/MWM subscapularis R  PT Short Term Goals - 06/08/19 1133      PT SHORT TERM GOAL #1   Title  Patient will demonstrate improved pelvic alignment and balance of musculature surrounding the pelvis to facilitate decreased PFM spasms and decrease pelvic pain.    Baseline  Pt. demonstrates severe posterior pelvic tilt, L thoracic/R lumbar scoliosis, and R anterior/L posterior pelvic obliquity. Pt. has demonstrated improved pelvic alignment and posture but has not been able to sustain long-term due to continued lack of postural strength.    Time  5    Period  Weeks    Status  Achieved    Target Date  03/06/19      PT SHORT TERM GOAL #2   Title  Patient will demonstrate a coordinated contraction, relaxation, and bulge of the pelvic floor muscles to demonstrate functional recruitment and motion and allow for bladder emptying.    Baseline  Pt. is unable to fully empty bladder and describes pain with catheterization, demonstrating high tone of PFM.    Time  5    Period  Weeks    Status  Achieved    Target Date  03/06/19       PT SHORT TERM GOAL #3   Title  Patient will demonstrate improved sitting and standing posture to demonstrate learning and decrease stress on the pelvic floor with functional activity.    Baseline  Pt. demonstrates severe posterior pelvic tilt, L thoracic/R lumbar scoliosis, and R anterior/L posterior pelvic obliquity. 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 Will be able to cath without pain and will be cleared by physiscian to d/c self-cath due to decreased residual volume with bladder emptying.    Baseline  required to cath 3 times per day, painful each time.    Time  10    Period  Weeks    Status  Achieved      PT LONG TERM GOAL #4   Title  Patient will score less than or equal to 40% on the Female NIH-CPSI and 30% on the [facility] to demonstrate a reduction in pain, urinary symptoms, and an improved quality of life.  Baseline  Female NIH-CPSI: 38/43 (88%) , PDI: 45/75 (60%); PDI 44/75 (59%) and Female NIH-CPSI: 27/43 (64%) on 02-08-19 , Female NIH-CPSI: 17% on 03-26-19    Time  10    Period   Weeks    Status  Achieved      PT LONG TERM GOAL #5   Title  Patient will demonstrate increased step length with reciprical arm swing, and demonstrate no scissoring steps.    Baseline  Pt currently demonstrated scissoring steppage on R LE, increased trunk rotation and deep core engagement with reciprical walking poles.    Time  10    Period  Weeks    Status  Achieved      PT LONG TERM GOAL #6   Title  Pt will increase her distance on the 6MWT from 747 feet  to  > 900 feet  order to progress with aerobic exercises and walking longer distances in the community    Time  8    Period  Weeks    Status  On-going      PT LONG TERM GOAL #7   Title  Pt will report waking up > 2 mornings in a row with pain level < 5-6/10 in order to demo maintanence of Tx benefits and more balance of mm tensions achieved with HEP, shoe lift to improve QOL    Baseline  7-8/10 level and benefits from Tx lasted only one morning  (03-19-19 and 03-23-19)    Time  10    Period  Weeks    Status  Partially Met      PT LONG TERM GOAL #8   Title  Pt will be able to demo increased PF strength with unilateral UE support on wall from B 3/5 15 reps in order to achieve stronger push off in gait and walk longer distance    Baseline  B, L 7 reps  2/5, 10 reps R 3/5  ( 12/17: 15 reps R , 13 reps L, 06/01/19: R 20 reps, L 18 reps  )    Time  10    Period  Weeks    Status  Achieved      PT LONG TERM GOAL  #9   TITLE  Pt will demo shoulder abduction > 120 deg B with improved alignment of thorax over pelvis without manual cues in order to demo proper co-activation of B equal weight bearing in BLE, and decreased thoracic kyhposis/ decreased thoracic rotation 2/2 scoliosis in order to lift plates overhead cabinets/ decreased risk for    Time  10    Period  Weeks    Status  Achieved      PT LONG TERM GOAL  #10   TITLE  Pt will demo increased height from 63" to > 63 1/2 " and maintain 63 1/2" across 3 weeks ( once a week measurement)  and forward head posture improve ( decreased distance from wall to earlobe 17 cm from earlobe to wall to > 12 cm)  in order to be ready to progress to PNF exercise with less shoulder pain and lengthen/ strengthen concave curve on R 2/2 scoliosis to minimize L lateral thigh tightness  (07/06/19 : 63 3/4",  08/10/19: 64"   )    Time  8    Period  Weeks    Status  Achieved      PT LONG TERM GOAL  #11   TITLE  Pt will demo single UE support with lunge position exercises/ yoga poses  in order to demo improved balance and IND with flexibility routine    Baseline  BUE support    Time  10    Period  Weeks    Status  On-going      PT LONG TERM GOAL  #12   TITLE  Pt will demo L trunk rotation in R sidelying with zero report of pain in order to progress with longer walking endurance    Time  10    Period  Weeks    Status  Achieved      PT LONG TERM GOAL  #13   TITLE  Pt will demo increased  shoulder abd R against wall in mini squat from 55 deg to > 75 deg in order to demo increased thoracic extension and intercostal lengthening for functional BUE mobility without compensations.  ( 4/2: 88 deg R )    Time  8    Period  Weeks    Status  Achieved      PT LONG TERM GOAL  #14   TITLE  Pt will demo less forward head posture ( earlobe to wall <13  cm) nd maintain height of 64  1/4" for 1 month in order to stand taller w/ less kyphosis and less deviations to spine to walk longer distances    Baseline  ( earlobe to wall 16 cm)  ( 64  1/14" on 09/14/19  )     Time  10    Period  Weeks    Status  On-going            Plan - 10/02/19 1351    Clinical Impression Statement  Pt demo'd good improvements with past HEP and Tx as rotational component to scoliosis has decreased. Provided training with hiking poles to promote less trunk lean. Pt required tactile cues and adjustment of poles and switching to bilateral propulsion instead of 2 point gait. Pt reported less pain with the activitty compared to 2 point  gait from past sessions.  Pt required minor manual Tx to increase mobility at thoracic and R subscapularis. Pt continues to benefit from skilled PT    Personal Factors and Comorbidities  Age;Comorbidity 3+    Comorbidities  Osteoporosis, scoliosis, Arthritis, COPD, recent lumbosacral fusion for spondylolisthesis.    Examination-Activity Limitations  Toileting;Sit;Stand;Bend;Lift;Carry    Examination-Participation Restrictions  Interpersonal Relationship;Yard Work;Cleaning;Laundry    Stability/Clinical Decision Making  Unstable/Unpredictable    Rehab Potential  Good    PT Frequency  2x / week    PT Duration  Other (comment)   20   PT Treatment/Interventions  ADLs/Self Care Home Management;Aquatic Therapy;Moist Heat;Electrical Stimulation;Traction;Therapeutic activities;Functional mobility training;Stair training;Gait training;Therapeutic exercise;Balance training;Neuromuscular re-education;Patient/family education;Manual techniques;Dry needling;Passive range of motion;Scar mobilization;Taping    Consulted and Agree with Plan of Care  Patient       Patient will benefit from skilled therapeutic intervention in order to improve the following deficits and impairments:  Abnormal gait, Decreased balance, Increased muscle spasms, Decreased range of motion, Decreased scar mobility, Improper body mechanics, Decreased coordination, Decreased strength, Increased fascial restricitons, Impaired flexibility, Postural dysfunction, Pain  Visit Diagnosis: Chronic right shoulder pain  Abnormal posture  Other muscle spasm  Other idiopathic scoliosis, thoracolumbar region     Problem List Patient Active Problem List   Diagnosis Date Noted  . Chronic UTI 12/06/2018  . Status post lumbar surgery 12/06/2018  . Spondylolisthesis, lumbar region 09/26/2018  . Aortic atherosclerosis ([place]) 08/31/2018  . CAD (coronary artery disease) 02/23/2018  . Advanced  care planning/counseling discussion 03/28/2017  .  Varicose veins of both lower extremities with complications 02/63/7858  . Arthritis 12/09/2016  . Vaginal atrophy 09/10/2015  . Menopausal hot flushes 09/10/2015  . Onychomycosis due to dermatophyte 04/07/2015  . Bursitis of right shoulder 02/26/2015  . Environmental and seasonal allergies 02/26/2015  . Other allergic rhinitis 02/26/2015  . Osteoporosis 11/26/2014  . COPD (chronic obstructive pulmonary disease) ([place]) 11/26/2014  . Fuchs' corneal dystrophy 11/26/2014  . Benign hypertensive renal disease 11/26/2014  . Hyperlipidemia 11/26/2014  . Chronic kidney disease, stage III (moderate) ([place]) 11/26/2014  . Hypothyroidism 11/26/2014  . Endothelial corneal dystrophy 11/26/2014    Jerl Mina ,PT, DPT, E-RYT  10/02/2019, 1:58 PM  [place] MAIN [facility] SERVICES [address] [place], [place], [address] Phone: [phone]   Fax:  [phone]  Name: Estell C Chain MRN: [id number] Date of Birth: Mar 29, 1946

## 2019-10-03 LAB — LIPID PANEL W/O CHOL/HDL RATIO
Cholesterol, Total: 220 mg/dL — ABNORMAL HIGH (ref 100–199)
HDL: 37 mg/dL — ABNORMAL LOW (ref >39)
LDL Chol Calc (NIH): 158 mg/dL — ABNORMAL HIGH (ref 0–99)
Triglycerides: 139 mg/dL (ref 0–149)
VLDL Cholesterol Cal: 25 mg/dL (ref 5–40)

## 2019-10-04 ENCOUNTER — Other Ambulatory Visit: Payer: Self-pay

## 2019-10-04 ENCOUNTER — Ambulatory Visit: Payer: Medicare Other | Admitting: Physical Therapy

## 2019-10-04 DIAGNOSIS — M4125 Other idiopathic scoliosis, thoracolumbar region: Secondary | ICD-10-CM | POA: Diagnosis not present

## 2019-10-04 DIAGNOSIS — G8929 Other chronic pain: Secondary | ICD-10-CM

## 2019-10-04 DIAGNOSIS — M62838 Other muscle spasm: Secondary | ICD-10-CM

## 2019-10-04 DIAGNOSIS — R293 Abnormal posture: Secondary | ICD-10-CM | POA: Diagnosis not present

## 2019-10-04 DIAGNOSIS — M25511 Pain in right shoulder: Secondary | ICD-10-CM | POA: Diagnosis not present

## 2019-10-04 NOTE — Patient Instructions (Signed)
STRETCHES  _wall stretch Clock stretch with head turns :  stand perpendicular to the wall, L side to the wall Tilt head to wall  Place L palm at 7 o clock Chin tuck like you are looking into armpit Look at "[place] on giant map  Swivel head with chin tucked to look in upper corner of ceiling as if you are look at  [place] on giant map "   5 reps   Switch direction, R palm on wall at 5 o 'clock   Chin tuck like you are looking into armpit Look at "[place]  on giant map  Swivel head with chin tucked to look in upper corner of ceiling as if you are look at  [facility] on giant map "   5 reps    ___  Add calf, seated hip flexor stretch in addition  to figure 4 and hamstring

## 2019-10-04 NOTE — Therapy (Signed)
[place] MAIN [facility] SERVICES [address] [place], [place], [address] Phone: [phone]   Fax:  [phone]  Physical Therapy Treatment  Patient Details  Name: Julia Deleon MRN: [id number] Date of Birth: Sep 08, 1945 Referring Provider (PT): Dr. Maryan Puls   Encounter Date: 10/04/2019  PT End of Session - 10/04/19 1106    Visit Number  27    Date for PT Re-Evaluation  11/23/19   Progress note to report #84 (last on #84  09/14/19)   Authorization Type  Medicare    PT Start Time  7681    PT Stop Time  1105    PT Time Calculation (min)  63 min    Activity Tolerance  Patient tolerated treatment well;No increased pain    Behavior During Therapy  WFL for tasks assessed/performed       Past Medical History:  Diagnosis Date  . Arthritis   . COPD (chronic obstructive pulmonary disease) ([place])   . Family history of adverse reaction to anesthesia    father was slow to wake up  . Fuch's endothelial dystrophy   . Hyperlipidemia   . Hypothyroidism   . PONV (postoperative nausea and vomiting)    slow to wake up and PONV    Past Surgical History:  Procedure Laterality Date  . APPENDECTOMY    . BACK SURGERY  09/26/2018   L4-5 PLIF by Dr. Arnoldo Morale  . CARDIAC CATHETERIZATION    . CHOLECYSTECTOMY    . EYE SURGERY    . FOOT SURGERY Right    pin removed left  . laser vein surgery    . NASAL SINUS SURGERY    . RIGHT/LEFT HEART CATH AND CORONARY ANGIOGRAPHY N/A 12/06/2017   Procedure: RIGHT/LEFT HEART CATH AND CORONARY ANGIOGRAPHY;  Surgeon: Yolonda Kida, MD;  Location: [place] [facility];  Service: Cardiovascular;  Laterality: N/A;  . TONSILLECTOMY      There were no vitals filed for this visit.  Subjective Assessment - 10/04/19 1015    Subjective  Pt found it easier to walk with the poles in the new way than before. Pt reports "it felt progressively better"         [facility] PT Assessment - 10/04/19 1016      Posture/Postural Control    Posture Comments  less thoracic kyphosis, less rotational component       Ambulation/Gait   Ambulation Distance (Feet)  626 Feet   around hospital hallways with hiking poles    Gait Comments  more thoracic extension/ L trunk lean with one point gattern with poles                     [facility] Adult PT Treatment/Exercise - 10/04/19 1016      Therapeutic Activites    Other Therapeutic Activities  stretches with larger muscle groups after walking , explained pain science, importance of slowing down, stretches       Neuro Re-ed    Neuro Re-ed Details   cued for more push off in R foot, L knee extension with hiking poles , cued for calf and hip flexor , neck stretches        Exercises   Other Exercises   see pt instructions       Moist Heat Therapy   Number Minutes Moist Heat  5 Minutes    Moist Heat Location  --   spine      Manual Therapy   Manual therapy comments  medial moob L T10 ( convex curve ) and L interspinals STM/MWM                PT Short Term Goals - 06/08/19 1133      PT SHORT TERM GOAL #1   Title  Patient will demonstrate improved pelvic alignment and balance of musculature surrounding the pelvis to facilitate decreased PFM spasms and decrease pelvic pain.    Baseline  Pt. demonstrates severe posterior pelvic tilt, L thoracic/R lumbar scoliosis, and R anterior/L posterior pelvic obliquity. Pt. has demonstrated improved pelvic alignment and posture but has not been able to sustain long-term due to continued lack of postural strength.    Time  5    Period  Weeks    Status  Achieved    Target Date  03/06/19      PT SHORT TERM GOAL #2   Title  Patient will demonstrate a coordinated contraction, relaxation, and bulge of the pelvic floor muscles to demonstrate functional recruitment and motion and allow for bladder emptying.    Baseline  Pt. is unable to fully empty bladder and describes pain with catheterization, demonstrating high tone of PFM.     Time  5    Period  Weeks    Status  Achieved    Target Date  03/06/19      PT SHORT TERM GOAL #3   Title  Patient will demonstrate improved sitting and standing posture to demonstrate learning and decrease stress on the pelvic floor with functional activity.    Baseline  Pt. demonstrates severe posterior pelvic tilt, L thoracic/R lumbar scoliosis, and R anterior/L posterior pelvic obliquity. 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 Will be able to cath without pain and will be cleared by physiscian to d/c self-cath due to decreased residual volume with bladder emptying.    Baseline  required to cath 3 times per day, painful each time.    Time  10    Period  Weeks    Status  Achieved      PT LONG TERM GOAL #4   Title  Patient will score less than or  equal to 40% on the Female NIH-CPSI and 30% on the [facility] to demonstrate a reduction in pain, urinary symptoms, and an improved quality of life.    Baseline  Female NIH-CPSI: 38/43 (88%) , PDI: 45/75 (60%); PDI 44/75 (59%) and Female NIH-CPSI: 27/43  (64%) on 02-08-19 , Female NIH-CPSI: 17% on 03-26-19    Time  10    Period  Weeks    Status  Achieved      PT LONG TERM GOAL #5   Title  Patient will demonstrate increased step length with reciprical arm swing, and demonstrate no scissoring steps.    Baseline  Pt currently demonstrated scissoring steppage on R LE, increased trunk rotation and deep core engagement with reciprical walking poles.    Time  10    Period  Weeks    Status  Achieved      PT LONG TERM GOAL #6   Title  Pt will increase her distance on the 6MWT from 747 feet  to  > 900 feet  order to progress with aerobic exercises and walking longer distances in the community    Time  8    Period  Weeks    Status  On-going      PT LONG TERM GOAL #7   Title  Pt will report waking up > 2 mornings in a row with pain level < 5-6/10 in order to demo maintanence of Tx benefits and more balance of mm tensions achieved with HEP, shoe lift to improve QOL    Baseline  7-8/10 level and benefits from Tx lasted only one morning  (03-19-19 and 03-23-19)    Time  10    Period  Weeks    Status  Partially Met      PT LONG TERM GOAL #8   Title  Pt will be able to demo increased PF strength with unilateral UE support on wall from B 3/5 15 reps in order to achieve stronger push off in gait and walk longer distance    Baseline  B, L 7 reps  2/5, 10 reps R 3/5  ( 12/17: 15 reps R , 13 reps L, 06/01/19: R 20 reps, L 18 reps  )    Time  10    Period  Weeks    Status  Achieved      PT LONG TERM GOAL  #9   TITLE  Pt will demo shoulder abduction > 120 deg B with improved alignment of thorax over pelvis without manual cues in order to demo proper co-activation of B equal weight bearing in BLE, and decreased thoracic kyhposis/ decreased thoracic rotation 2/2 scoliosis in order to lift plates overhead cabinets/ decreased risk for    Time  10    Period  Weeks    Status  Achieved      PT LONG TERM GOAL  #10   TITLE  Pt will demo increased height from 63"  to > 63 1/2 " and maintain 63 1/2" across 3 weeks ( once a week measurement) and forward head posture improve ( decreased distance from wall to earlobe 17 cm from earlobe to wall to > 12 cm)  in order to be ready to progress to PNF exercise with less shoulder pain and lengthen/ strengthen concave curve on R 2/2 scoliosis to minimize L lateral thigh tightness  (07/06/19 : 63 3/4",  08/10/19: 64"   )    Time  8    Period  Weeks    Status  Achieved      PT LONG TERM GOAL  #11   TITLE  Pt will demo single UE support with lunge position exercises/ yoga poses  in order to demo improved balance and IND with flexibility routine    Baseline  BUE support    Time  10    Period  Weeks    Status  On-going      PT LONG TERM GOAL  #12   TITLE  Pt will demo L trunk rotation in R sidelying with zero report of pain in order to progress with longer walking endurance    Time  10    Period  Weeks    Status  Achieved      PT LONG TERM GOAL  #13   TITLE  Pt will demo increased  shoulder abd R against wall in mini squat from 55 deg to > 75 deg in order to demo increased thoracic extension and intercostal lengthening for functional BUE mobility without compensations.  ( 4/2: 88 deg R )    Time  8    Period  Weeks    Status  Achieved      PT LONG TERM GOAL  #14   TITLE  Pt will demo less forward head posture ( earlobe to wall <13  cm) nd maintain height of 64  1/4" for 1 month in order to stand taller w/ less kyphosis and less deviations to spine to walk longer distances    Baseline  ( earlobe to wall 16 cm)  ( 64  1/14" on 09/14/19  )     Time  10    Period  Weeks    Status  On-going            Plan - 10/04/19 1018    Clinical Impression Statement  Pt tried gait training with hiking poles using one point gait pattern with B pole propulsion today and reported she did not notice the tightness in her legs as she was focused on the technique.  Pt walked 626 ft in hospital hallways with hiking poles. Plan to  continue to train pt in community environ with stretches in upcoming sessions.  Pt required minor manual Tx at L thoracic convex curve today. Other areas of tightness in thoracic areas remain to be minimal as pt remains compliant with HEP.  Pt continues to benefit from skilled PT    Personal Factors and Comorbidities  Age;Comorbidity 3+    Comorbidities  Osteoporosis, scoliosis, Arthritis, COPD, recent lumbosacral fusion for spondylolisthesis.    Examination-Activity Limitations  Toileting;Sit;Stand;Bend;Lift;Carry    Examination-Participation Restrictions  Interpersonal Relationship;Yard Work;Cleaning;Laundry    Stability/Clinical Decision Making  Unstable/Unpredictable    Rehab Potential  Good    PT Frequency  2x / week    PT Duration  Other (comment)   20   PT Treatment/Interventions  ADLs/Self Care Home Management;Aquatic Therapy;Moist Heat;Electrical Stimulation;Traction;Therapeutic activities;Functional mobility training;Stair training;Gait training;Therapeutic exercise;Balance training;Neuromuscular re-education;Patient/family education;Manual techniques;Dry needling;Passive range of motion;Scar mobilization;Taping    Consulted and Agree with Plan of Care  Patient       Patient will benefit from skilled therapeutic intervention in order to improve the following deficits and impairments:  Abnormal gait, Decreased balance, Increased muscle spasms, Decreased range of motion, Decreased scar mobility, Improper body mechanics, Decreased coordination, Decreased strength, Increased fascial restricitons, Impaired flexibility, Postural dysfunction, Pain  Visit Diagnosis: Chronic right shoulder pain  Abnormal posture  Other muscle spasm  Other idiopathic scoliosis, thoracolumbar region  Problem List Patient Active Problem List   Diagnosis Date Noted  . Chronic UTI 12/06/2018  . Status post lumbar surgery 12/06/2018  . Spondylolisthesis, lumbar region 09/26/2018  . Aortic  atherosclerosis ([place]) 08/31/2018  . CAD (coronary artery disease) 02/23/2018  . Advanced care planning/counseling discussion 03/28/2017  . Varicose veins of both lower extremities with complications 01/26/8021  . Arthritis 12/09/2016  . Vaginal atrophy 09/10/2015  . Menopausal hot flushes 09/10/2015  . Onychomycosis due to dermatophyte 04/07/2015  . Bursitis of right shoulder 02/26/2015  . Environmental and seasonal allergies 02/26/2015  . Other allergic rhinitis 02/26/2015  . Osteoporosis 11/26/2014  . COPD (chronic obstructive pulmonary disease) ([place]) 11/26/2014  . Fuchs' corneal dystrophy 11/26/2014  . Benign hypertensive renal disease 11/26/2014  . Hyperlipidemia 11/26/2014  . Chronic kidney disease, stage III (moderate) ([place]) 11/26/2014  . Hypothyroidism 11/26/2014  . Endothelial corneal dystrophy 11/26/2014    Jerl Mina  ,PT, DPT, E-RYT  10/04/2019, 11:29 AM  [place] MAIN [facility] SERVICES [address] [place], [place], [address] Phone: [phone]   Fax:  [phone]  Name: Julia Deleon MRN: [id number] Date of Birth: 10/30/1945

## 2019-10-05 ENCOUNTER — Ambulatory Visit: Payer: Medicare Other | Admitting: Physical Therapy

## 2019-10-09 ENCOUNTER — Encounter: Payer: Medicare Other | Admitting: Physical Therapy

## 2019-10-12 ENCOUNTER — Ambulatory Visit: Payer: Medicare Other | Attending: Urology | Admitting: Physical Therapy

## 2019-10-12 ENCOUNTER — Other Ambulatory Visit: Payer: Self-pay

## 2019-10-12 DIAGNOSIS — R293 Abnormal posture: Secondary | ICD-10-CM | POA: Diagnosis present

## 2019-10-12 DIAGNOSIS — M25511 Pain in right shoulder: Secondary | ICD-10-CM | POA: Insufficient documentation

## 2019-10-12 DIAGNOSIS — G8929 Other chronic pain: Secondary | ICD-10-CM

## 2019-10-12 DIAGNOSIS — M62838 Other muscle spasm: Secondary | ICD-10-CM

## 2019-10-12 DIAGNOSIS — M4125 Other idiopathic scoliosis, thoracolumbar region: Secondary | ICD-10-CM | POA: Diagnosis present

## 2019-10-12 NOTE — Therapy (Signed)
[place] MAIN [facility] SERVICES [address] [place], [place], [address] Phone: [phone]   Fax:  [phone]  Physical Therapy Treatment  Patient Details  Name: Julia Deleon MRN: [id number] Date of Birth: 12/27/1945 Referring Provider (PT): Dr. Maryan Puls   Encounter Date: 10/12/2019  PT End of Session - 10/12/19 0949    Visit Number  14    Date for PT Re-Evaluation  11/23/19   Progress note to report #84 (last on #84  09/14/19)   Authorization Type  Medicare    PT Start Time  0904    PT Stop Time  1000    PT Time Calculation (min)  56 min    Activity Tolerance  Patient tolerated treatment well;No increased pain    Behavior During Therapy  WFL for tasks assessed/performed       Past Medical History:  Diagnosis Date  . Arthritis   . COPD (chronic obstructive pulmonary disease) ([place])   . Family history of adverse reaction to anesthesia    father was slow to wake up  . Fuch's endothelial dystrophy   . Hyperlipidemia   . Hypothyroidism   . PONV (postoperative nausea and vomiting)    slow to wake up and PONV    Past Surgical History:  Procedure Laterality Date  . APPENDECTOMY    . BACK SURGERY  09/26/2018   L4-5 PLIF by Dr. Arnoldo Morale  . CARDIAC CATHETERIZATION    . CHOLECYSTECTOMY    . EYE SURGERY    . FOOT SURGERY Right    pin removed left  . laser vein surgery    . NASAL SINUS SURGERY    . RIGHT/LEFT HEART CATH AND CORONARY ANGIOGRAPHY N/A 12/06/2017   Procedure: RIGHT/LEFT HEART CATH AND CORONARY ANGIOGRAPHY;  Surgeon: Yolonda Kida, MD;  Location: [facility];  Service: Cardiovascular;  Laterality: N/A;  . TONSILLECTOMY      There were no vitals filed for this visit.  Subjective Assessment - 10/12/19 0913    Subjective  Pt found it easier to walk with the poles in the new way than before. Pt reports "it felt progressively better"         [facility] PT Assessment - 10/12/19 0948      Ambulation/Gait   Ambulation Distance (Feet)  575 Feet   6 min    Assistive device  --   hiking poles    Gait Comments  cued for more thoracic ext, scapular retraction                     [facility] Adult PT Treatment/Exercise - 10/12/19 0946      Neuro Re-ed    Neuro Re-ed Details   cued for thoracic lateral R shift, downgraded to seated position because standing was too difficulty with compensatory movement with side flexion                PT Short Term Goals - 06/08/19 1133      PT SHORT TERM GOAL #1   Title  Patient will demonstrate improved pelvic alignment and balance of musculature surrounding the pelvis to facilitate decreased PFM spasms and decrease pelvic pain.    Baseline  Pt. demonstrates severe posterior pelvic tilt, L thoracic/R lumbar scoliosis, and R anterior/L posterior pelvic obliquity. Pt. has demonstrated improved pelvic alignment and posture but has not been able to sustain long-term due to continued lack of postural strength.    Time  5  Period  Weeks    Status  Achieved    Target Date  03/06/19      PT SHORT TERM GOAL #2   Title  Patient will demonstrate a coordinated contraction, relaxation, and bulge of the pelvic floor muscles to demonstrate functional recruitment and motion and allow for bladder emptying.    Baseline  Pt. is unable to fully empty bladder and describes pain with catheterization, demonstrating high tone of PFM.    Time  5    Period  Weeks    Status  Achieved    Target Date  03/06/19      PT SHORT TERM GOAL #3   Title  Patient will demonstrate improved sitting and standing posture to demonstrate learning and decrease stress on the pelvic floor with functional activity.    Baseline  Pt. demonstrates severe posterior pelvic tilt, L thoracic/R lumbar scoliosis, and R anterior/L posterior pelvic obliquity. 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 Will be able to cath without pain and will be cleared by physiscian to d/c self-cath due to decreased residual volume with bladder emptying.    Baseline  required to cath 3 times per day, painful each time.    Time  10    Period  Weeks    Status  Achieved      PT LONG TERM GOAL #4   Title  Patient will score less than or equal to 40% on the Female NIH-CPSI and 30% on the [facility] to demonstrate a reduction in pain, urinary symptoms, and an improved quality of life.    Baseline  Female NIH-CPSI: 38/43 (88%) , PDI: 45/75 (60%); PDI 44/75 (59%) and Female NIH-CPSI: 27/43 (64%) on 02-08-19 , Female NIH-CPSI: 17% on 03-26-19    Time  10    Period  Weeks    Status  Achieved      PT LONG TERM GOAL #5   Title  Patient will demonstrate increased step length with reciprical arm swing, and demonstrate no scissoring steps.    Baseline  Pt currently demonstrated scissoring steppage on R LE, increased trunk rotation and deep core engagement with  reciprical walking poles.    Time  10    Period  Weeks    Status  Achieved      PT LONG TERM GOAL #6   Title  Pt will increase her distance on the 6MWT from 747  feet  to  > 900 feet  order to progress with aerobic exercises and walking longer distances in the community    Time  8    Period  Weeks    Status  On-going      PT LONG TERM GOAL #7   Title  Pt will report waking up > 2 mornings in a row with pain level < 5-6/10 in order to demo maintanence of Tx benefits and more balance of mm tensions achieved with HEP, shoe lift to improve QOL    Baseline  7-8/10 level and benefits from Tx lasted only one morning  (03-19-19 and 03-23-19)    Time  10    Period  Weeks    Status  Partially Met      PT LONG TERM GOAL #8   Title  Pt will be able to demo increased PF strength with unilateral UE support on wall from B 3/5 15 reps in order to achieve stronger push off in gait and walk longer distance    Baseline  B, L 7 reps  2/5, 10 reps R 3/5  ( 12/17: 15 reps R , 13 reps L, 06/01/19: R 20 reps, L 18 reps  )    Time  10    Period  Weeks    Status  Achieved      PT LONG TERM GOAL  #9   TITLE  Pt will demo shoulder abduction > 120 deg B with improved alignment of thorax over pelvis without manual cues in order to demo proper co-activation of B equal weight bearing in BLE, and decreased thoracic kyhposis/ decreased thoracic rotation 2/2 scoliosis in order to lift plates overhead cabinets/ decreased risk for    Time  10    Period  Weeks    Status  Achieved      PT LONG TERM GOAL  #10   TITLE  Pt will demo increased height from 63" to > 63 1/2 " and maintain 63 1/2" across 3 weeks ( once a week measurement) and forward head posture improve ( decreased distance from wall to earlobe 17 cm from earlobe to wall to > 12 cm)  in order to be ready to progress to PNF exercise with less shoulder pain and lengthen/ strengthen concave curve on R 2/2 scoliosis to minimize L lateral thigh tightness  (07/06/19 : 63 3/4",  08/10/19: 64"   )    Time  8    Period  Weeks    Status  Achieved      PT LONG TERM GOAL  #11   TITLE  Pt will demo single UE support with lunge position  exercises/ yoga poses  in order to demo improved balance and IND with flexibility routine    Baseline  BUE support    Time  10    Period  Weeks    Status  On-going      PT LONG TERM GOAL  #12   TITLE  Pt will demo L trunk rotation in R sidelying with zero report of pain in order to progress with longer walking endurance    Time  10    Period  Weeks    Status  Achieved  PT LONG TERM GOAL  #13   TITLE  Pt will demo increased  shoulder abd R against wall in mini squat from 55 deg to > 75 deg in order to demo increased thoracic extension and intercostal lengthening for functional BUE mobility without compensations.  ( 4/2: 88 deg R )    Time  8    Period  Weeks    Status  Achieved      PT LONG TERM GOAL  #14   TITLE  Pt will demo less forward head posture ( earlobe to wall <13  cm) nd maintain height of 64  1/4" for 1 month in order to stand taller w/ less kyphosis and less deviations to spine to walk longer distances    Baseline  ( earlobe to wall 16 cm)  ( 64  1/14" on 09/14/19  )     Time  10    Period  Weeks    Status  On-going            Plan - 10/12/19 0949    Clinical Impression Statement  Continued with gait training with hiking poles to promote isometric strengthening of trunk mm, scapular retraction and throacic extensions to minimize R trunk lean. Pt ambulated 575 ft  and reported she did not pay any attention to her tightness and stated it was easier than last time. Pt required cues for more scapular retraction. Progressed pt to R trunk shift exercise but she requried a downgraded version due to poor propioception in standing position. Also provided cues for better spinal alignment in stretches. Pt is demo'ing more IND with stretches. Upcoming sessions will be focused on helping pt progress to trunk lateral shift in standing position without posterior tilt of pelvis and sideflexion as a compensatory mechanism. Pt continues to benefit from skilled PT    Personal Factors and  Comorbidities  Age;Comorbidity 3+    Comorbidities  Osteoporosis, scoliosis, Arthritis, COPD, recent lumbosacral fusion for spondylolisthesis.    Examination-Activity Limitations  Toileting;Sit;Stand;Bend;Lift;Carry    Examination-Participation Restrictions  Interpersonal Relationship;Yard Work;Cleaning;Laundry    Stability/Clinical Decision Making  Unstable/Unpredictable    Rehab Potential  Good    PT Frequency  2x / week    PT Duration  Other (comment)   20   PT Treatment/Interventions  ADLs/Self Care Home Management;Aquatic Therapy;Moist Heat;Electrical Stimulation;Traction;Therapeutic activities;Functional mobility training;Stair training;Gait training;Therapeutic exercise;Balance training;Neuromuscular re-education;Patient/family education;Manual techniques;Dry needling;Passive range of motion;Scar mobilization;Taping    Consulted and Agree with Plan of Care  Patient       Patient will benefit from skilled therapeutic intervention in order to improve the following deficits and impairments:  Abnormal gait, Decreased balance, Increased muscle spasms, Decreased range of motion, Decreased scar mobility, Improper body mechanics, Decreased coordination, Decreased strength, Increased fascial restricitons, Impaired flexibility, Postural dysfunction, Pain  Visit Diagnosis: Chronic right shoulder pain  Abnormal posture  Other muscle spasm  Other idiopathic scoliosis, thoracolumbar region     Problem List Patient Active Problem List   Diagnosis Date Noted  . Chronic UTI 12/06/2018  . Status post lumbar surgery 12/06/2018  . Spondylolisthesis, lumbar region 09/26/2018  . Aortic atherosclerosis ([place]) 08/31/2018  . CAD (coronary artery disease) 02/23/2018  . Advanced care planning/counseling discussion 03/28/2017  . Varicose veins of both lower extremities with complications 80/07/4915  . Arthritis 12/09/2016  . Vaginal atrophy 09/10/2015  . Menopausal hot flushes 09/10/2015  .  Onychomycosis due to dermatophyte 04/07/2015  . Bursitis of right shoulder 02/26/2015  . Environmental and seasonal allergies  02/26/2015  . Other allergic rhinitis 02/26/2015  . Osteoporosis 11/26/2014  . COPD (chronic obstructive pulmonary disease) ([place]) 11/26/2014  . Fuchs' corneal dystrophy 11/26/2014  . Benign hypertensive renal disease 11/26/2014  . Hyperlipidemia 11/26/2014  . Chronic kidney disease, stage III (moderate) ([place]) 11/26/2014  . Hypothyroidism 11/26/2014  . Endothelial corneal dystrophy 11/26/2014    Jerl Mina ,PT, DPT, E-RYT  10/12/2019, 10:00  AM  [place] MAIN [facility] SERVICES [address] [place], [place], [address] Phone: [phone]   Fax:  [phone]  Name: Julia Deleon MRN: [id number] Date of Birth: April 13, 1946

## 2019-10-12 NOTE — Patient Instructions (Signed)
Seated R lateral shift with hands pushing down on seat

## 2019-10-16 ENCOUNTER — Other Ambulatory Visit: Payer: Self-pay

## 2019-10-16 ENCOUNTER — Ambulatory Visit: Payer: Medicare Other | Admitting: Physical Therapy

## 2019-10-16 DIAGNOSIS — G8929 Other chronic pain: Secondary | ICD-10-CM

## 2019-10-16 DIAGNOSIS — R293 Abnormal posture: Secondary | ICD-10-CM

## 2019-10-16 DIAGNOSIS — M4125 Other idiopathic scoliosis, thoracolumbar region: Secondary | ICD-10-CM

## 2019-10-16 DIAGNOSIS — M62838 Other muscle spasm: Secondary | ICD-10-CM

## 2019-10-16 DIAGNOSIS — M25511 Pain in right shoulder: Secondary | ICD-10-CM | POA: Diagnosis not present

## 2019-10-16 NOTE — Patient Instructions (Addendum)
Sitting at the corner for the lunge position stretch Add quad ( front thigh) stretch use a towel on L ankle, Point knee out and then pull ankle towards buttocks   ___  Continue to make 10-15 min in the afternoon to rest

## 2019-10-17 NOTE — Therapy (Signed)
[place] MAIN [facility] SERVICES [address] [place], [place], [address] Phone: [phone]   Fax:  [phone]  Physical Therapy Treatment  Patient Details  Name: Julia Deleon MRN: [id number] Date of Birth: [birth year] Referring Provider (PT): Dr. Maryan Puls   Encounter Date: 10/16/2019  PT End of Session - 10/16/19 1121    Visit Number  [id number]    Date for PT Re-Evaluation  11/23/19   Progress note to report #84 (last on #84  09/14/19)   Authorization Type  Medicare    PT Start Time  1103    PT Stop Time  1200    PT Time Calculation (min)  57 min    Activity Tolerance  Patient tolerated treatment well;No increased pain    Behavior During Therapy  WFL for tasks assessed/performed       Past Medical History:  Diagnosis Date  . Arthritis   . COPD (chronic obstructive pulmonary disease) ([place])   . Family history of adverse reaction to anesthesia    father was slow to wake up  . Fuch's endothelial dystrophy   . Hyperlipidemia   . Hypothyroidism   . PONV (postoperative nausea and vomiting)    slow to wake up and PONV    Past Surgical History:  Procedure Laterality Date  . APPENDECTOMY    . BACK SURGERY  09/26/2018   L4-5 PLIF by Dr. Arnoldo Morale  . CARDIAC CATHETERIZATION    . CHOLECYSTECTOMY    . EYE SURGERY    . FOOT SURGERY Right    pin removed left  . laser vein surgery    . NASAL SINUS SURGERY    . RIGHT/LEFT HEART CATH AND CORONARY ANGIOGRAPHY N/A 12/06/2017   Procedure: RIGHT/LEFT HEART CATH AND CORONARY ANGIOGRAPHY;  Surgeon: Yolonda Kida, MD;  Location: [place] [facility];  Service: Cardiovascular;  Laterality: N/A;  . TONSILLECTOMY      There were no vitals filed for this visit.  Subjective Assessment - 10/16/19 1113    Subjective  Pt reported feeling tired after last session but she woke up feeling a little better than most mornings after PT. Pt has noticed she is no longer getting up in the middle of the night to use  the bathroom on most nights.  Pt noticed she noticed that her nerves healed after 9-10 months of surgery.         [facility] PT Assessment - 10/17/19 0943      Observation/Other Assessments   Observations  less rotational component to spine ,       Other:   Other/ Comments  seated qquad stretch with poor alignment of knee ( report of pain by L thigh and knee)       Ambulation/Gait   Ambulation Distance (Feet)  575 Feet   4:50    Assistive device  Other (Comment)   hiking poles    Gait Comments  less R trunk lean                     [facility] Adult PT Treatment/Exercise - 10/17/19 0936      Therapeutic Activites    Other Therapeutic Activities  cued for gait with hiking poles, reinforced rest and stretches        Neuro Re-ed    Neuro Re-ed Details   cued for quad stretch       Modalities   Modalities  Moist Heat      Moist Heat Therapy  Number Minutes Moist Heat  6 Minutes    Moist Heat Location  Other (comment)   back               PT Short Term Goals - 06/08/19 1133      PT SHORT TERM GOAL #1   Title  Patient will demonstrate improved pelvic alignment and balance of musculature surrounding the pelvis to facilitate decreased PFM spasms and decrease pelvic pain.    Baseline  Pt. demonstrates severe posterior pelvic tilt, L thoracic/R lumbar scoliosis, and R anterior/L posterior pelvic obliquity. Pt. has demonstrated improved pelvic alignment and posture but has not been able to sustain long-term due to continued lack of postural strength.    Time  5    Period  Weeks    Status  Achieved    Target Date  03/06/19      PT SHORT TERM GOAL #2   Title  Patient will demonstrate a coordinated contraction, relaxation, and bulge of the pelvic floor muscles to demonstrate functional recruitment and motion and allow for bladder emptying.    Baseline  Pt. is unable to fully empty bladder and describes pain with catheterization, demonstrating high tone of PFM.    Time   5    Period  Weeks    Status  Achieved    Target Date  03/06/19      PT SHORT TERM GOAL #3   Title  Patient will demonstrate improved sitting and standing posture to demonstrate learning and decrease stress on the pelvic floor with functional activity.    Baseline  Pt. demonstrates severe posterior pelvic tilt, L thoracic/R lumbar scoliosis, and R anterior/L posterior pelvic obliquity. 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 Will be able to cath without pain and will be cleared by physiscian to d/c self-cath due to decreased residual volume with bladder emptying.    Baseline  required to cath 3 times per day, painful each time.    Time  10    Period  Weeks    Status  Achieved      PT LONG TERM GOAL #4   Title  Patient will score less than or equal to 40% on the Female NIH-CPSI and 30% on the [facility] to demonstrate a reduction in pain, urinary symptoms, and an improved quality of life.    Baseline  Female NIH-CPSI: 38/43 (88%) , PDI: 45/75 (60%); PDI 44/75 (59%) and Female NIH-CPSI: 27/43 (64%)  on 02-08-19 , Female NIH-CPSI: 17% on 03-26-19    Time  10    Period  Weeks    Status  Achieved      PT LONG TERM GOAL #5   Title  Patient will demonstrate increased step length with reciprical arm swing, and demonstrate no scissoring steps.    Baseline  Pt currently demonstrated scissoring steppage on R LE, increased trunk rotation and deep core engagement with reciprical walking poles.    Time  10    Period  Weeks    Status  Achieved      PT LONG TERM GOAL #6   Title  Pt will increase her distance on the 6MWT from 747 feet  to  > 900 feet  order to progress with aerobic exercises and walking longer distances in the community    Time  8    Period  Weeks    Status  On-going      PT LONG TERM GOAL #7   Title  Pt will report waking up > 2 mornings in a row with pain level < 5-6/10 in order to demo maintanence of Tx benefits and more balance of mm tensions achieved with HEP, shoe lift to improve QOL    Baseline  7-8/10 level and benefits from Tx lasted only one morning  (03-19-19 and 03-23-19)    Time  10    Period  Weeks    Status  Partially Met      PT LONG TERM GOAL #8   Title  Pt will be able to demo increased PF strength with unilateral UE support on wall from B 3/5 15 reps in order to achieve stronger push off in gait and walk longer distance    Baseline  B, L 7 reps  2/5, 10 reps R 3/5  ( 12/17: 15 reps R , 13 reps L, 06/01/19: R 20 reps, L 18 reps  )    Time  10    Period  Weeks    Status  Achieved      PT LONG TERM GOAL  #9   TITLE  Pt will demo shoulder abduction > 120 deg B with improved alignment of thorax over pelvis without manual cues in order to demo proper co-activation of B equal weight bearing in BLE, and decreased thoracic kyhposis/ decreased thoracic rotation 2/2 scoliosis in order to lift plates overhead cabinets/ decreased risk for    Time  10    Period  Weeks    Status  Achieved      PT LONG TERM GOAL  #10   TITLE  Pt will demo increased height from 63" to >  63 1/2 " and maintain 63 1/2" across 3 weeks ( once a week measurement) and forward head posture improve ( decreased distance from wall to earlobe 17 cm from earlobe to wall to > 12 cm)  in order to be ready to progress to PNF exercise with less shoulder pain and lengthen/ strengthen concave curve on R 2/2 scoliosis to minimize L lateral thigh tightness  (07/06/19 : 63 3/4",  08/10/19: 64"   )    Time  8    Period  Weeks    Status  Achieved      PT LONG TERM GOAL  #11   TITLE  Pt will demo single UE support with lunge position exercises/ yoga poses  in order to demo improved balance and IND with flexibility routine    Baseline  BUE support    Time  10    Period  Weeks    Status  On-going      PT LONG TERM GOAL  #12   TITLE  Pt will demo L trunk rotation in R sidelying with zero report of pain in order to progress with longer walking endurance    Time  10    Period  Weeks    Status  Achieved      PT LONG TERM GOAL  #13   TITLE  Pt will demo increased  shoulder abd R against wall in mini squat from 55 deg to > 75 deg in order to demo increased thoracic extension and intercostal lengthening for functional BUE mobility without compensations.  ( 4/2: 88 deg R )    Time  8    Period  Weeks    Status  Achieved      PT LONG TERM GOAL  #14   TITLE  Pt will demo less forward head posture ( earlobe to wall <13  cm) nd maintain height of 64  1/4" for 1 month in order to stand taller w/ less kyphosis and less deviations to spine to walk longer distances    Baseline  ( earlobe to wall 16 cm)  ( 64  1/14" on 09/14/19  )     Time  10    Period  Weeks    Status  On-going            Plan - 10/16/19 1122    Clinical Impression Statement Pt is making progress with the past week's focus on gait training with hiking poles and review of stretches post walking.  Pt walked faster today by 1 min  with hiking poles and demo'd less R trunk lean across the 575 ft without report of increased pain .  Pt  reported feeling tired after last session but she woke up feeling a little better than most mornings after PT. Pt has noticed she is no longer getting up in the middle of the night to use the bathroom on most nights.    Pt noticed  that her nerves healed after 9-10 months of surgery.   Added quad stretch today but pt required cues to realign knees to minimize L thigh/ knee pain. Reinforced more rest in the afternoons and explained about activity pacing to minimize pain and tightness. Pt voiced understanding.  Pt continues to benefit from skilled PT     Personal Factors and Comorbidities  Age;Comorbidity 3+    Comorbidities  Osteoporosis, scoliosis, Arthritis, COPD, recent lumbosacral fusion for spondylolisthesis.    Examination-Activity Limitations  Toileting;Sit;Stand;Bend;Lift;Carry    Examination-Participation Restrictions  Interpersonal Relationship;Yard Work;Cleaning;Laundry    Stability/Clinical Decision Making  Unstable/Unpredictable    Rehab Potential  Good    PT Frequency  2x / week    PT Duration  Other (comment)   20   PT Treatment/Interventions  ADLs/Self Care Home Management;Aquatic Therapy;Moist Heat;Electrical Stimulation;Traction;Therapeutic activities;Functional mobility training;Stair training;Gait training;Therapeutic exercise;Balance training;Neuromuscular re-education;Patient/family education;Manual techniques;Dry needling;Passive range of motion;Scar mobilization;Taping    Consulted and Agree with Plan of Care  Patient       Patient will benefit from skilled therapeutic intervention  in order to improve the following deficits and impairments:  Abnormal gait, Decreased balance, Increased muscle spasms, Decreased range of motion, Decreased scar mobility, Improper body mechanics, Decreased coordination, Decreased strength, Increased fascial restricitons, Impaired flexibility, Postural dysfunction, Pain  Visit Diagnosis: Chronic right shoulder pain  Abnormal  posture  Other muscle spasm  Other idiopathic scoliosis, thoracolumbar region     Problem List Patient Active Problem List   Diagnosis Date Noted  . Chronic UTI 12/06/2018  . Status post lumbar surgery 12/06/2018  . Spondylolisthesis, lumbar region 09/26/2018  . Aortic atherosclerosis ([place]) 08/31/2018  . CAD (coronary artery disease) 02/23/2018  . Advanced care planning/counseling discussion 03/28/2017  . Varicose veins of both lower extremities with complications 87/19/9412  . Arthritis 12/09/2016  . Vaginal atrophy 09/10/2015  . Menopausal hot flushes 09/10/2015  . Onychomycosis due to dermatophyte 04/07/2015  . Bursitis of right shoulder 02/26/2015  . Environmental and seasonal allergies 02/26/2015  . Other allergic rhinitis 02/26/2015  . Osteoporosis 11/26/2014  . COPD (chronic obstructive pulmonary disease) ([place]) 11/26/2014  . Fuchs' corneal dystrophy 11/26/2014  . Benign hypertensive renal disease 11/26/2014  . Hyperlipidemia 11/26/2014  . Chronic kidney disease, stage III (moderate) ([place]) 11/26/2014  . Hypothyroidism 11/26/2014  . Endothelial corneal dystrophy 11/26/2014    Jerl Mina ,PT, DPT, E-RYT  10/17/2019, 9:46 AM  [place] MAIN [facility] SERVICES [address] [place], [place], [address] Phone: [phone]   Fax:  [phone]  Name: Julia Deleon MRN: [id number] Date of Birth: 1946/01/30

## 2019-10-23 ENCOUNTER — Other Ambulatory Visit: Payer: Self-pay

## 2019-10-23 ENCOUNTER — Ambulatory Visit: Payer: Medicare Other | Admitting: Physical Therapy

## 2019-10-23 DIAGNOSIS — M25511 Pain in right shoulder: Secondary | ICD-10-CM | POA: Diagnosis not present

## 2019-10-23 DIAGNOSIS — M62838 Other muscle spasm: Secondary | ICD-10-CM

## 2019-10-23 DIAGNOSIS — R293 Abnormal posture: Secondary | ICD-10-CM

## 2019-10-23 DIAGNOSIS — G8929 Other chronic pain: Secondary | ICD-10-CM

## 2019-10-23 DIAGNOSIS — M4125 Other idiopathic scoliosis, thoracolumbar region: Secondary | ICD-10-CM

## 2019-10-23 NOTE — Patient Instructions (Signed)
   Side of hip stretch:  Reclined twist for hips and side of the hips/ legs  Lay on your back, knees bend Scoot hips to the  L , leave shoulders in place Drop knees to the R side resting onto pillows to keep leg at the same width of hips Pillow under R thigh to minimize too much strain   __  Do this stretch before and after DEEP core 1-2  ( handout)

## 2019-10-23 NOTE — Therapy (Signed)
[place] MAIN [facility] SERVICES [address] [place], [place], [address] Phone: [phone]   Fax:  [phone]  Physical Therapy Treatment  Patient Details  Name: Julia Deleon MRN: [id number] Date of Birth: 26-Mar-1946 Referring Provider (PT): Dr. Maryan Puls   Encounter Date: 10/23/2019   PT End of Session - 10/23/19 0912    Visit Number [id number]    Date for PT Re-Evaluation 11/23/19   Progress note to report #84 (last on #84  09/14/19)   Authorization Type Medicare    PT Start Time 0908    PT Stop Time 1003    PT Time Calculation (min) 55 min    Activity Tolerance Patient tolerated treatment well;No increased pain    Behavior During Therapy WFL for tasks assessed/performed           Past Medical History:  Diagnosis Date  . Arthritis   . COPD (chronic obstructive pulmonary disease) ([place])   . Family history of adverse reaction to anesthesia    father was slow to wake up  . Fuch's endothelial dystrophy   . Hyperlipidemia   . Hypothyroidism   . PONV (postoperative nausea and vomiting)    slow to wake up and PONV    Past Surgical History:  Procedure Laterality Date  . APPENDECTOMY    . BACK SURGERY  09/26/2018   L4-5 PLIF by Dr. Arnoldo Morale  . CARDIAC CATHETERIZATION    . CHOLECYSTECTOMY    . EYE SURGERY    . FOOT SURGERY Right    pin removed left  . laser vein surgery    . NASAL SINUS SURGERY    . RIGHT/LEFT HEART CATH AND CORONARY ANGIOGRAPHY N/A 12/06/2017   Procedure: RIGHT/LEFT HEART CATH AND CORONARY ANGIOGRAPHY;  Surgeon: Yolonda Kida, MD;  Location: [place] [facility];  Service: Cardiovascular;  Laterality: N/A;  . TONSILLECTOMY      There were no vitals filed for this visit.   Subjective Assessment - 10/23/19 0909    Subjective Pt reported feeling very exhausted the day after last session and felt she had no energy.  She felt better the following day .  Pt reported she never emptied since her first childbirth which  included the use of forceps.  Pt reported she was not be able to void for 3 days after her delivery  and she was catherized. Pt has had UTIs ongoing  for 4 decades.                          Pelvic Floor Special Questions - 10/23/19 0958    Pelvic Floor Internal Exam pt consented without contraindications     Exam Type Vaginal    Palpation increased tightness  at L ATLA    L radiating pain to ankle shortly after internal Tx   Strength fair squeeze, definite lift   proper sequential movement             [facility] Adult PT Treatment/Exercise - 10/23/19 1007      Neuro Re-ed    Neuro Re-ed Details  cued for new stretch, deep core level 1-2, and pelvic floor movement, reviewed HEP stretch       Manual Therapy   Internal Pelvic Floor STM/MWM at R ATLA area                      PT Short Term Goals - 06/08/19 1133  PT SHORT TERM GOAL #1   Title Patient will demonstrate improved pelvic alignment and balance of musculature surrounding the pelvis to facilitate decreased PFM spasms and decrease pelvic pain.    Baseline Pt. demonstrates severe posterior pelvic tilt, L thoracic/R lumbar scoliosis, and R anterior/L posterior pelvic obliquity. Pt. has demonstrated improved pelvic alignment and posture but has not been able to sustain long-term due to continued lack of postural strength.    Time 5    Period Weeks    Status Achieved    Target Date 03/06/19      PT SHORT TERM GOAL #2   Title Patient will demonstrate a coordinated contraction, relaxation, and bulge of the pelvic floor muscles to demonstrate functional recruitment and motion and allow for bladder emptying.    Baseline Pt. is unable to fully empty bladder and describes pain with catheterization, demonstrating high tone of PFM.    Time 5    Period Weeks    Status Achieved    Target Date 03/06/19      PT SHORT TERM GOAL #3   Title Patient will demonstrate improved sitting and standing posture to  demonstrate learning and decrease stress on the pelvic floor with functional activity.    Baseline Pt. demonstrates severe posterior pelvic tilt, L thoracic/R lumbar scoliosis, and R anterior/L posterior pelvic obliquity. 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 Will be able to cath without pain and will be cleared by physiscian to d/c self-cath due to decreased residual volume with bladder emptying.    Baseline required to cath 3 times per day, painful each time.    Time 10    Period Weeks    Status Achieved      PT LONG TERM GOAL #4   Title Patient will score less than or equal to 40% on the Female NIH-CPSI and 30% on the [facility] to demonstrate a reduction in pain, urinary symptoms, and an improved quality of life.    Baseline Female NIH-CPSI: 38/43 (88%) , PDI: 45/75 (60%); PDI 44/75 (59%) and Female NIH-CPSI: 27/43 (64%) on 02-08-19 , Female NIH-CPSI: 17% on 03-26-19    Time 10    Period Weeks    Status  Achieved      PT LONG TERM GOAL #5   Title Patient will demonstrate increased step length with  reciprical arm swing, and demonstrate no scissoring steps.    Baseline Pt currently demonstrated scissoring steppage on R LE, increased trunk rotation and deep core engagement with reciprical walking poles.    Time 10    Period Weeks    Status Achieved      PT LONG TERM GOAL #6   Title Pt will increase her distance on the 6MWT from 747 feet  to  > 900 feet  order to progress with aerobic exercises and walking longer distances in the community    Time 8    Period Weeks    Status On-going      PT LONG TERM GOAL #7   Title Pt will report waking up > 2 mornings in a row with pain level < 5-6/10 in order to demo maintanence of Tx benefits and more balance of mm tensions achieved with HEP, shoe lift to improve QOL    Baseline 7-8/10 level and benefits from Tx lasted only one morning  (03-19-19 and 03-23-19)    Time 10    Period Weeks    Status Partially Met      PT LONG TERM GOAL #8   Title Pt will be able to demo increased PF strength with unilateral UE support on wall from B 3/5 15 reps in order to achieve stronger push off in gait and walk longer distance    Baseline B, L 7 reps  2/5, 10 reps R 3/5  ( 12/17: 15 reps R , 13 reps L, 06/01/19: R 20 reps, L 18 reps  )    Time 10    Period Weeks    Status Achieved      PT LONG TERM GOAL  #9   TITLE Pt will demo shoulder abduction > 120 deg B with improved alignment of thorax over pelvis without manual cues in order to demo proper co-activation of B equal weight bearing in BLE, and decreased thoracic kyhposis/ decreased thoracic rotation 2/2 scoliosis in order to lift plates overhead cabinets/ decreased risk for    Time 10    Period Weeks    Status Achieved      PT LONG TERM GOAL  #10   TITLE Pt will demo increased height from 63" to > 63 1/2 " and maintain 63 1/2" across 3 weeks ( once a week measurement) and forward head posture improve ( decreased distance from wall to earlobe 17 cm from earlobe to wall to > 12 cm)  in order to be ready to  progress to PNF exercise with less shoulder pain and lengthen/ strengthen concave curve on R 2/2 scoliosis to minimize L lateral thigh tightness  (07/06/19 : 63 3/4",  08/10/19: 64"   )    Time 8    Period Weeks    Status Achieved      PT LONG TERM GOAL  #11   TITLE Pt will demo single UE support with lunge position exercises/ yoga poses  in order to demo improved balance and IND with flexibility routine    Baseline BUE support    Time 10    Period Weeks    Status On-going      PT LONG TERM GOAL  #12   TITLE Pt will demo L trunk rotation in R sidelying with zero report of pain in order to progress with longer walking endurance  Time 10    Period Weeks    Status Achieved      PT LONG TERM GOAL  #13   TITLE Pt will demo increased  shoulder abd R against wall in mini squat from 55 deg to > 75 deg in order to demo increased thoracic extension and intercostal lengthening for functional BUE mobility without compensations.  ( 4/2: 88 deg R )    Time 8    Period Weeks    Status Achieved      PT LONG TERM GOAL  #14   TITLE Pt will demo less forward head posture ( earlobe to wall <13  cm) nd maintain height of 64  1/4" for 1 month in order to stand taller w/ less kyphosis and less deviations to spine to walk longer distances    Baseline ( earlobe to wall 16 cm)  ( 64  1/14" on 09/14/19  )     Time 10    Period Weeks    Status On-going                 Plan - 10/23/19 1008    Clinical Impression Statement Pt demo'd significantly decreased R trunk lean in gait which indicates improved posural stability. Pt agrees to get hiking poles to practice walking with which will continue to help pt progress towards her goals to walk on treadmill with less pain.   Pelvic floor reassessment was performed which was the first time since March. Pt demo'd fewer areas of tightness, localized at R ATLA area. Following Tx, pt demo'd increased mobility of pelvic floor, proper coordination of all 3 layers.  Still withholding kegel exercises due to pt's past overactive pelvic floor floor.  Explained the role of the pelvic floor and deep core for postural stability which will help with minimizing worsening of scoliosis and optimize longer endurance with walking with less spinal deviations. Pt continues to benefit from skilled PT     Personal Factors and Comorbidities Age;Comorbidity 3+    Comorbidities Osteoporosis, scoliosis, Arthritis, COPD, recent lumbosacral fusion for spondylolisthesis.    Examination-Activity Limitations Toileting;Sit;Stand;Bend;Lift;Carry    Examination-Participation Restrictions Interpersonal Relationship;Yard Work;Cleaning;Laundry    Stability/Clinical Decision Making Unstable/Unpredictable    Rehab Potential Good    PT Frequency 2x / week    PT Duration Other (comment)   20   PT Treatment/Interventions ADLs/Self Care Home Management;Aquatic Therapy;Moist Heat;Electrical Stimulation;Traction;Therapeutic activities;Functional mobility training;Stair training;Gait training;Therapeutic exercise;Balance training;Neuromuscular re-education;Patient/family education;Manual techniques;Dry needling;Passive range of motion;Scar mobilization;Taping    Consulted and Agree with Plan of Care Patient           Patient will benefit from skilled therapeutic intervention in order to improve the following deficits and impairments:  Abnormal gait, Decreased balance, Increased muscle spasms, Decreased range of motion, Decreased scar mobility, Improper body mechanics, Decreased coordination, Decreased strength, Increased fascial restricitons, Impaired flexibility, Postural dysfunction, Pain  Visit Diagnosis: Other idiopathic scoliosis, thoracolumbar region  Chronic right shoulder pain  Abnormal posture  Other muscle spasm     Problem List Patient Active Problem List   Diagnosis Date Noted  . Chronic UTI 12/06/2018  . Status post lumbar surgery 12/06/2018  . Spondylolisthesis,  lumbar region 09/26/2018  . Aortic atherosclerosis ([place]) 08/31/2018  . CAD (coronary artery disease) 02/23/2018  . Advanced care planning/counseling discussion 03/28/2017  . Varicose veins of both lower extremities with complications 16/02/9603  . Arthritis 12/09/2016  . Vaginal atrophy 09/10/2015  . Menopausal hot flushes 09/10/2015  . Onychomycosis due to dermatophyte  04/07/2015  . Bursitis of right shoulder 02/26/2015  . Environmental and seasonal allergies 02/26/2015  . Other allergic rhinitis 02/26/2015  . Osteoporosis 11/26/2014  . COPD (chronic obstructive pulmonary disease) ([place]) 11/26/2014  . Fuchs' corneal dystrophy 11/26/2014  . Benign hypertensive renal disease 11/26/2014  . Hyperlipidemia 11/26/2014  . Chronic kidney disease, stage III (moderate) ([place]) 11/26/2014  . Hypothyroidism 11/26/2014  . Endothelial corneal dystrophy 11/26/2014    Jerl Mina ,PT, DPT, E-RYT  10/23/2019, 1:15 PM  [place] MAIN [facility] SERVICES [address] [place], [place], [address] Phone: [phone]   Fax:  [phone]  Name: Julia Deleon MRN: [id number] Date of Birth: 15-Jul-1945

## 2019-10-26 ENCOUNTER — Other Ambulatory Visit: Payer: Self-pay

## 2019-10-26 ENCOUNTER — Ambulatory Visit: Payer: Medicare Other | Admitting: Physical Therapy

## 2019-10-26 DIAGNOSIS — M4125 Other idiopathic scoliosis, thoracolumbar region: Secondary | ICD-10-CM

## 2019-10-26 DIAGNOSIS — M25511 Pain in right shoulder: Secondary | ICD-10-CM | POA: Diagnosis not present

## 2019-10-26 DIAGNOSIS — R293 Abnormal posture: Secondary | ICD-10-CM

## 2019-10-26 DIAGNOSIS — G8929 Other chronic pain: Secondary | ICD-10-CM

## 2019-10-26 DIAGNOSIS — M62838 Other muscle spasm: Secondary | ICD-10-CM

## 2019-10-26 NOTE — Therapy (Signed)
[place] MAIN [facility] SERVICES [address] [place], [place], [address] Phone: [phone]   Fax:  [phone]  Physical Therapy Treatment  Patient Details  Name: Julia Deleon MRN: [id number] Date of Birth: 01/01/1946 Referring Provider (PT): Dr. Maryan Puls   Encounter Date: 10/26/2019   PT End of Session - 10/26/19 0911    Visit Number 41    Date for PT Re-Evaluation 11/23/19   Progress note to report #84 (last on #84  09/14/19)   Authorization Type Medicare    PT Start Time 0905    PT Stop Time 1005    PT Time Calculation (min) 60 min    Activity Tolerance Patient tolerated treatment well;No increased pain    Behavior During Therapy WFL for tasks assessed/performed           Past Medical History:  Diagnosis Date  . Arthritis   . COPD (chronic obstructive pulmonary disease) ([place])   . Family history of adverse reaction to anesthesia    father was slow to wake up  . Fuch's endothelial dystrophy   . Hyperlipidemia   . Hypothyroidism   . PONV (postoperative nausea and vomiting)    slow to wake up and PONV    Past Surgical History:  Procedure Laterality Date  . APPENDECTOMY    . BACK SURGERY  09/26/2018   L4-5 PLIF by Dr. Arnoldo Morale  . CARDIAC CATHETERIZATION    . CHOLECYSTECTOMY    . EYE SURGERY    . FOOT SURGERY Right    pin removed left  . laser vein surgery    . NASAL SINUS SURGERY    . RIGHT/LEFT HEART CATH AND CORONARY ANGIOGRAPHY N/A 12/06/2017   Procedure: RIGHT/LEFT HEART CATH AND CORONARY ANGIOGRAPHY;  Surgeon: Yolonda Kida, MD;  Location: [place] [facility];  Service: Cardiovascular;  Laterality: N/A;  . TONSILLECTOMY      There were no vitals filed for this visit.   Subjective Assessment - 10/26/19 0912    Subjective Pt reported she had a slight HA after the internal pelvic floor assessment and it was not as intense as the one she had the last time an internal pelvic floor assessment was performed back March  2021              [facility] PT Assessment - 10/26/19 1002      Coordination   Gross Motor Movements are Fluid and Coordinated --   short breathing, minimal pelvic floor movement     Ambulation/Gait   Ambulation Distance (Feet) 575 Feet   4:00    Assistive device Other (Comment)   hiking poles    Gait Comments longer stride, more scapular retraction                      Pelvic Floor Special Questions - 10/26/19 0954    Pelvic Floor Internal Exam pt consented without contraindications     Exam Type Vaginal    Palpation minor  tightness  at R ATLA     Strength fair squeeze, definite lift   proper sequential movement, but short breathing                      PT Short Term Goals - 06/08/19 1133      PT SHORT TERM GOAL #1   Title Patient will demonstrate improved pelvic alignment and balance of musculature surrounding the pelvis to facilitate decreased PFM spasms and decrease pelvic  pain.    Baseline Pt. demonstrates severe posterior pelvic tilt, L thoracic/R lumbar scoliosis, and R anterior/L posterior pelvic obliquity. Pt. has demonstrated improved pelvic alignment and posture but has not been able to sustain long-term due to continued lack of postural strength.    Time 5    Period Weeks    Status Achieved    Target Date 03/06/19      PT SHORT TERM GOAL #2   Title Patient will demonstrate a coordinated contraction, relaxation, and bulge of the pelvic floor muscles to demonstrate functional recruitment and motion and allow for bladder emptying.    Baseline Pt. is unable to fully empty bladder and describes pain with catheterization, demonstrating high tone of PFM.    Time 5    Period Weeks    Status Achieved    Target Date 03/06/19      PT SHORT TERM GOAL #3   Title Patient will demonstrate improved sitting and standing posture to demonstrate learning and decrease stress on the pelvic floor with functional activity.    Baseline Pt. demonstrates severe  posterior pelvic tilt, L thoracic/R lumbar scoliosis, and R anterior/L posterior pelvic obliquity. 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 Will be able to cath without pain and will be cleared by physiscian to d/c self-cath due to decreased residual volume with bladder emptying.    Baseline required to cath 3 times per day, painful each time.    Time 10    Period Weeks    Status Achieved      PT LONG TERM GOAL #4   Title Patient will score less than or equal to 40% on the Female NIH-CPSI and 30% on the [facility] to demonstrate a reduction in pain, urinary symptoms, and an improved quality of life.    Baseline Female NIH-CPSI: 38/43 (88%) , PDI: 45/75 (60%); PDI 44/75 (59%) and Female NIH-CPSI: 27/43 (64%) on 02-08-19 , Female NIH-CPSI: 17% on 03-26-19    Time 10    Period Weeks    Status Achieved      PT LONG TERM GOAL #5   Title Patient will demonstrate increased step length with reciprical arm swing, and demonstrate no scissoring steps.  Baseline Pt currently demonstrated scissoring steppage on R LE,  increased trunk rotation and deep core engagement with reciprical walking poles.    Time 10    Period Weeks    Status Achieved      PT LONG TERM GOAL #6   Title Pt will increase her distance on the 6MWT from 747 feet  to  > 900 feet  order to progress with aerobic exercises and walking longer distances in the community    Time 8    Period Weeks    Status On-going      PT LONG TERM GOAL #7   Title Pt will report waking up > 2 mornings in a row with pain level < 5-6/10 in order to demo maintanence of Tx benefits and more balance of mm tensions achieved with HEP, shoe lift to improve QOL    Baseline 7-8/10 level and benefits from Tx lasted only one morning  (03-19-19 and 03-23-19)    Time 10    Period Weeks    Status Partially Met      PT LONG TERM GOAL #8   Title Pt will be able to demo increased PF strength with unilateral UE support on wall from B 3/5 15 reps in order to achieve stronger push off in gait and walk longer distance    Baseline B, L 7 reps  2/5, 10 reps R 3/5  ( 12/17: 15 reps R , 13 reps L, 06/01/19: R 20 reps, L 18 reps  )    Time 10    Period Weeks    Status Achieved      PT LONG TERM GOAL  #9   TITLE Pt will demo shoulder abduction > 120 deg B with improved alignment of thorax over pelvis without manual cues in order to demo proper co-activation of B equal weight bearing in BLE, and decreased thoracic kyhposis/ decreased thoracic rotation 2/2 scoliosis in order to lift plates overhead cabinets/ decreased risk for    Time 10    Period Weeks    Status Achieved      PT LONG TERM GOAL  #10   TITLE Pt will demo increased height from 63" to > 63 1/2 " and maintain 63 1/2" across 3 weeks ( once a week measurement) and forward head posture improve ( decreased distance from wall to earlobe 17 cm from earlobe to wall to > 12 cm)  in order to be ready to progress to PNF exercise with less shoulder pain and lengthen/ strengthen concave curve on R 2/2 scoliosis to minimize L lateral  thigh tightness  (07/06/19 : 63 3/4",  08/10/19: 64"   )    Time 8    Period Weeks    Status Achieved      PT LONG TERM GOAL  #11   TITLE Pt will demo single UE support with lunge position exercises/ yoga poses  in order to demo improved balance and IND with flexibility routine    Baseline BUE support    Time 10    Period Weeks    Status On-going      PT LONG TERM GOAL  #12   TITLE Pt will demo L trunk rotation in R sidelying with zero report of pain in order to progress with longer walking endurance    Time 10    Period Weeks    Status Achieved      PT LONG TERM GOAL  #13   TITLE Pt will demo increased  shoulder abd R against wall in mini squat from 55 deg to > 75 deg in order to demo increased thoracic extension and intercostal lengthening for functional BUE mobility without compensations.  ( 4/2: 88 deg R )    Time 8    Period Weeks    Status Achieved      PT LONG TERM GOAL  #14   TITLE Pt will demo less forward head posture ( earlobe to wall <13  cm) nd maintain height of 64  1/4" for 1 month in order to stand taller w/ less kyphosis and less deviations to spine to walk longer distances    Baseline ( earlobe to wall 16 cm)  ( 64  1/14" on 09/14/19  )     Time 10    Period Weeks    Status On-going                 Plan - 10/26/19 1005    Clinical Impression Statement Pt demo'd improved pelvic floor mobility with excessive cues for lengthened breathign coordination with diaphragm to activate IAP. Pt demo'd longer strides with hiking pole walking and her time decreased from 4:50 minutes last week to 4:00 minutes this week walking 575 ft both times. Pt is more IND with post walking stretches with less cues. Pt continues to benefit from skilled PT    Personal Factors and Comorbidities Age;Comorbidity 3+    Comorbidities Osteoporosis, scoliosis, Arthritis, COPD, recent lumbosacral fusion for spondylolisthesis.    Examination-Activity Limitations Toileting;Sit;Stand;Bend;Lift;Carry     Examination-Participation Restrictions Interpersonal Relationship;Yard Work;Cleaning;Laundry    Stability/Clinical Decision Making Unstable/Unpredictable    Rehab Potential Good    PT Frequency 2x / week    PT Duration Other (comment)   20   PT Treatment/Interventions ADLs/Self Care Home Management;Aquatic Therapy;Moist Heat;Electrical Stimulation;Traction;Therapeutic activities;Functional mobility training;Stair training;Gait training;Therapeutic exercise;Balance training;Neuromuscular re-education;Patient/family education;Manual techniques;Dry needling;Passive range of motion;Scar mobilization;Taping    Consulted and Agree with Plan of Care Patient           Patient will benefit from skilled therapeutic intervention in order to improve the following deficits and impairments:  Abnormal gait, Decreased balance, Increased muscle spasms, Decreased range of motion, Decreased scar mobility, Improper body mechanics, Decreased coordination, Decreased strength, Increased fascial restricitons, Impaired flexibility, Postural dysfunction, Pain  Visit Diagnosis: Other idiopathic scoliosis, thoracolumbar region  Chronic right shoulder pain  Abnormal posture  Other muscle spasm     Problem List Patient Active Problem List   Diagnosis Date Noted  . Chronic UTI 12/06/2018  . Status post lumbar surgery 12/06/2018  . Spondylolisthesis, lumbar region 09/26/2018  . Aortic atherosclerosis ([place]) 08/31/2018  . CAD (coronary artery disease) 02/23/2018  . Advanced care planning/counseling discussion 03/28/2017  . Varicose veins of both lower extremities with complications 00/76/2263  . Arthritis 12/09/2016  . Vaginal atrophy 09/10/2015  . Menopausal hot flushes 09/10/2015  . Onychomycosis due to dermatophyte 04/07/2015  . Bursitis of right shoulder 02/26/2015  . Environmental and seasonal allergies 02/26/2015  . Other allergic rhinitis 02/26/2015  . Osteoporosis 11/26/2014  . COPD (chronic  obstructive pulmonary disease) ([place]) 11/26/2014  . Fuchs' corneal dystrophy 11/26/2014  . Benign hypertensive renal disease 11/26/2014  . Hyperlipidemia 11/26/2014  . Chronic kidney disease, stage III (moderate) ([place]) 11/26/2014  . Hypothyroidism 11/26/2014  . Endothelial corneal dystrophy 11/26/2014    Jerl Mina 10/26/2019, 11:57 AM  [place] MAIN [facility] SERVICES [address] [place], [place], [address] Phone: [phone]   Fax:  [phone]  Name: Julia Deleon MRN: [id number] Date of Birth: 1945-12-12

## 2019-10-26 NOTE — Patient Instructions (Signed)
Longer breathing for pelvic floor movements  1-2 pause, expansion at the ribs 2-1 pause, notice pelvic floor gentle lifting

## 2019-10-30 ENCOUNTER — Other Ambulatory Visit: Payer: Self-pay

## 2019-10-30 ENCOUNTER — Ambulatory Visit: Payer: Medicare Other | Admitting: Physical Therapy

## 2019-10-30 DIAGNOSIS — M4125 Other idiopathic scoliosis, thoracolumbar region: Secondary | ICD-10-CM

## 2019-10-30 DIAGNOSIS — R293 Abnormal posture: Secondary | ICD-10-CM

## 2019-10-30 DIAGNOSIS — M25511 Pain in right shoulder: Secondary | ICD-10-CM | POA: Diagnosis not present

## 2019-10-30 DIAGNOSIS — M62838 Other muscle spasm: Secondary | ICD-10-CM

## 2019-10-30 DIAGNOSIS — G8929 Other chronic pain: Secondary | ICD-10-CM

## 2019-10-30 NOTE — Patient Instructions (Addendum)
Make sure to have anterior tilt in stretches    Stretch on rainy days before heading out or running on errand run   ___   Side stepping / minsquat hallway 2 laps

## 2019-10-30 NOTE — Therapy (Signed)
[place] MAIN [facility] SERVICES [address] [place], [place], [address] Phone: [phone]   Fax:  [phone]  Physical Therapy Treatment  Patient Details  Name: Julia Deleon MRN: [id number] Date of Birth: 28-Apr-1946 Referring Provider (PT): Dr. Maryan Puls   Encounter Date: 10/30/2019   PT End of Session - 10/30/19 1050    Visit Number 12    Date for PT Re-Evaluation 11/23/19   Progress note to report #84 (last on #84  09/14/19)   Authorization Type Medicare    PT Start Time 1004    PT Stop Time 1104    PT Time Calculation (min) 60 min    Activity Tolerance Patient tolerated treatment well;No increased pain    Behavior During Therapy WFL for tasks assessed/performed           Past Medical History:  Diagnosis Date  . Arthritis   . COPD (chronic obstructive pulmonary disease) ([place])   . Family history of adverse reaction to anesthesia    father was slow to wake up  . Fuch's endothelial dystrophy   . Hyperlipidemia   . Hypothyroidism   . PONV (postoperative nausea and vomiting)    slow to wake up and PONV    Past Surgical History:  Procedure Laterality Date  . APPENDECTOMY    . BACK SURGERY  09/26/2018   L4-5 PLIF by Dr. Arnoldo Morale  . CARDIAC CATHETERIZATION    . CHOLECYSTECTOMY    . EYE SURGERY    . FOOT SURGERY Right    pin removed left  . laser vein surgery    . NASAL SINUS SURGERY    . RIGHT/LEFT HEART CATH AND CORONARY ANGIOGRAPHY N/A 12/06/2017   Procedure: RIGHT/LEFT HEART CATH AND CORONARY ANGIOGRAPHY;  Surgeon: Yolonda Kida, MD;  Location: [facility];  Service: Cardiovascular;  Laterality: N/A;  . TONSILLECTOMY      There were no vitals filed for this visit.   Subjective Assessment - 10/30/19 1009    Subjective Pt reported she felt 20% better after last session and she over did it the next day and then she took a nap the following day              [facility] PT Assessment - 10/30/19 1041       Strength   Overall Strength Comments hip abd B 3/5,                          [facility] Adult PT Treatment/Exercise - 10/30/19 1041      Ambulation/Gait   Ambulation Distance (Feet) 575 Feet   4:00, reported body aches from barometric pressure   Assistive device Other (Comment)   hiking poles    Gait Comments longer stride, more scapular retraction      Therapeutic Activites    Other Therapeutic Activities gait training       Neuro Re-ed    Neuro Re-ed Details  excessive cues for anterior tilt of pelvic in seated stretches and in mini squat rise,       Manual Therapy   Manual therapy comments PA mob FABDER L Grade II                     PT Short Term Goals - 06/08/19 1133      PT SHORT TERM GOAL #1   Title Patient will demonstrate improved pelvic alignment and balance of musculature surrounding the pelvis to  facilitate decreased PFM spasms and decrease pelvic pain.    Baseline Pt. demonstrates severe posterior pelvic tilt, L thoracic/R lumbar scoliosis, and R anterior/L posterior pelvic obliquity. Pt. has demonstrated improved pelvic alignment and posture but has not been able to sustain long-term due to continued lack of postural strength.    Time 5    Period Weeks    Status Achieved    Target Date 03/06/19      PT SHORT TERM GOAL #2   Title Patient will demonstrate a coordinated contraction, relaxation, and bulge of the pelvic floor muscles to demonstrate functional recruitment and motion and allow for bladder emptying.    Baseline Pt. is unable to fully empty bladder and describes pain with catheterization, demonstrating high tone of PFM.    Time 5    Period Weeks    Status Achieved    Target Date 03/06/19      PT SHORT TERM GOAL #3   Title Patient will demonstrate improved sitting and standing posture to demonstrate learning and decrease stress on the pelvic floor with functional activity.    Baseline Pt. demonstrates severe posterior pelvic tilt, L  thoracic/R lumbar scoliosis, and R anterior/L posterior pelvic obliquity. 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 Will be able to cath without pain and will be cleared by physiscian to d/c self-cath due to decreased residual volume with bladder emptying.    Baseline required to cath 3 times per day, painful each time.    Time 10    Period Weeks    Status Achieved      PT LONG TERM GOAL #4   Title Patient will score less than or equal to 40% on the Female NIH-CPSI and 30% on the [facility] to demonstrate a reduction in pain, urinary symptoms, and an improved quality of life.    Baseline Female NIH-CPSI: 38/43 (88%) , PDI: 45/75 (60%); PDI 44/75 (59%) and Female NIH-CPSI: 27/43 (64%) on 02-08-19 , Female NIH-CPSI: 17% on 03-26-19    Time 10    Period Weeks    Status Achieved      PT LONG TERM GOAL #5   Title Patient will demonstrate increased step length with reciprical  arm swing, and demonstrate no scissoring steps.    Baseline Pt currently demonstrated scissoring steppage on R LE, increased trunk rotation  and deep core engagement with reciprical walking poles.    Time 10    Period Weeks    Status Achieved      PT LONG TERM GOAL #6   Title Pt will increase her distance on the 6MWT from 747 feet  to  > 900 feet  order to progress with aerobic exercises and walking longer distances in the community    Time 8    Period Weeks    Status On-going      PT LONG TERM GOAL #7   Title Pt will report waking up > 2 mornings in a row with pain level < 5-6/10 in order to demo maintanence of Tx benefits and more balance of mm tensions achieved with HEP, shoe lift to improve QOL    Baseline 7-8/10 level and benefits from Tx lasted only one morning  (03-19-19 and 03-23-19)    Time 10    Period Weeks    Status Partially Met      PT LONG TERM GOAL #8   Title Pt will be able to demo increased PF strength with unilateral UE support on wall from B 3/5 15 reps in order to achieve stronger push off in gait and walk longer distance    Baseline B, L 7 reps  2/5, 10 reps R 3/5  ( 12/17: 15 reps R , 13 reps L, 06/01/19: R 20 reps, L 18 reps  )    Time 10    Period Weeks    Status Achieved      PT LONG TERM GOAL  #9   TITLE Pt will demo shoulder abduction > 120 deg B with improved alignment of thorax over pelvis without manual cues in order to demo proper co-activation of B equal weight bearing in BLE, and decreased thoracic kyhposis/ decreased thoracic rotation 2/2 scoliosis in order to lift plates overhead cabinets/ decreased risk for    Time 10    Period Weeks    Status Achieved      PT LONG TERM GOAL  #10   TITLE Pt will demo increased height from 63" to > 63 1/2 " and maintain 63 1/2" across 3 weeks ( once a week measurement) and forward head posture improve ( decreased distance from wall to earlobe 17 cm from earlobe to wall to > 12 cm)  in order to be ready to progress to PNF exercise with less shoulder pain and lengthen/ strengthen concave curve on R 2/2 scoliosis to minimize L lateral thigh tightness   (07/06/19 : 63 3/4",  08/10/19: 64"   )    Time 8    Period Weeks    Status Achieved      PT LONG TERM GOAL  #11   TITLE Pt will demo single UE support with lunge position exercises/ yoga poses  in order to demo improved balance and IND with flexibility routine    Baseline BUE support    Time 10    Period Weeks    Status On-going      PT LONG TERM GOAL  #12   TITLE Pt will demo L trunk rotation in R sidelying with zero report of pain in order to progress with longer walking endurance    Time 10    Period Weeks    Status Achieved      PT LONG TERM GOAL  #  13   TITLE Pt will demo increased  shoulder abd R against wall in mini squat from 55 deg to > 75 deg in order to demo increased thoracic extension and intercostal lengthening for functional BUE mobility without compensations.  ( 4/2: 88 deg R )    Time 8    Period Weeks    Status Achieved      PT LONG TERM GOAL  #14   TITLE Pt will demo less forward head posture ( earlobe to wall <13  cm) nd maintain height of 64  1/4" for 1 month in order to stand taller w/ less kyphosis and less deviations to spine to walk longer distances    Baseline ( earlobe to wall 16 cm)  ( 64  1/14" on 09/14/19  )     Time 10    Period Weeks    Status On-going                 Plan - 10/30/19 1133    Clinical Impression Statement Today,  pt was guided to stretch prior and after walking. Added mini squat and side stepping in to HEP and required excessive cues for anterior tilt of pelvis in seated stretches and minisquat. Pt walked the same distance in the same amount of time as last session ( 4" across 575 ft) with hiking poles. This is an improvement as pt came to session already feeling affected by the barometric pressure with aches in her body 2/2 arthritis. Today's session showed pt the importance of stretching especially on days when she is affected by her arthritis. Educated motion is lotion with intentional stretches. Pt continues to benefit from  skilled PT. Plan to return to pelvic floor assessment and Tx at next session as pt defer today.        Personal Factors and Comorbidities Age;Comorbidity 3+    Comorbidities Osteoporosis, scoliosis, Arthritis, COPD, recent lumbosacral fusion for spondylolisthesis.    Examination-Activity Limitations Toileting;Sit;Stand;Bend;Lift;Carry    Examination-Participation Restrictions Interpersonal Relationship;Yard Work;Cleaning;Laundry    Stability/Clinical Decision Making Unstable/Unpredictable    Rehab Potential Good    PT Frequency 2x / week    PT Duration Other (comment)   20   PT Treatment/Interventions ADLs/Self Care Home Management;Aquatic Therapy;Moist Heat;Electrical Stimulation;Traction;Therapeutic activities;Functional mobility training;Stair training;Gait training;Therapeutic exercise;Balance training;Neuromuscular re-education;Patient/family education;Manual techniques;Dry needling;Passive range of motion;Scar mobilization;Taping    Consulted and Agree with Plan of Care Patient           Patient will benefit from skilled therapeutic intervention in order to improve the following deficits and impairments:  Abnormal gait, Decreased balance, Increased muscle spasms, Decreased range of motion, Decreased scar mobility, Improper body mechanics, Decreased coordination, Decreased strength, Increased fascial restricitons, Impaired flexibility, Postural dysfunction, Pain  Visit Diagnosis: Other idiopathic scoliosis, thoracolumbar region  Chronic right shoulder pain  Abnormal posture  Other muscle spasm     Problem List Patient Active Problem List   Diagnosis Date Noted  . Chronic UTI 12/06/2018  . Status post lumbar surgery 12/06/2018  . Spondylolisthesis, lumbar region 09/26/2018  . Aortic atherosclerosis ([place]) 08/31/2018  . CAD (coronary artery disease) 02/23/2018  . Advanced care planning/counseling discussion 03/28/2017  . Varicose veins of both lower extremities with  complications 89/37/3428  . Arthritis 12/09/2016  . Vaginal atrophy 09/10/2015  . Menopausal hot flushes 09/10/2015  . Onychomycosis due to dermatophyte 04/07/2015  . Bursitis of right shoulder 02/26/2015  . Environmental and seasonal allergies 02/26/2015  . Other allergic rhinitis 02/26/2015  . Osteoporosis  11/26/2014  . COPD (chronic obstructive pulmonary disease) ([place]) 11/26/2014  . Fuchs' corneal dystrophy 11/26/2014  . Benign hypertensive renal disease 11/26/2014  . Hyperlipidemia 11/26/2014  . Chronic kidney disease, stage III (moderate) ([place]) 11/26/2014  . Hypothyroidism 11/26/2014  . Endothelial corneal dystrophy 11/26/2014    Jerl Mina ,PT, DPT, E-RYT  10/30/2019, 11:34 AM  [place] MAIN [facility] SERVICES [address] [place], [place], [address] Phone: [phone]   Fax:  [phone]  Name: Julia Deleon MRN: [id number] Date of Birth: [birth year]

## 2019-11-02 ENCOUNTER — Other Ambulatory Visit: Payer: Self-pay

## 2019-11-02 ENCOUNTER — Ambulatory Visit: Payer: Medicare Other | Admitting: Physical Therapy

## 2019-11-02 DIAGNOSIS — M4125 Other idiopathic scoliosis, thoracolumbar region: Secondary | ICD-10-CM

## 2019-11-02 DIAGNOSIS — M25511 Pain in right shoulder: Secondary | ICD-10-CM | POA: Diagnosis not present

## 2019-11-02 DIAGNOSIS — M62838 Other muscle spasm: Secondary | ICD-10-CM

## 2019-11-02 DIAGNOSIS — G8929 Other chronic pain: Secondary | ICD-10-CM

## 2019-11-02 DIAGNOSIS — R293 Abnormal posture: Secondary | ICD-10-CM

## 2019-11-02 NOTE — Therapy (Signed)
[place] MAIN [facility] SERVICES [address] [place], [place], [address] Phone: [phone]   Fax:  [phone]  Physical Therapy Treatment  Patient Details  Name: Julia Deleon MRN: [id number] Date of Birth: May 07, 1946 Referring Provider (PT): Dr. Maryan Puls   Encounter Date: 11/02/2019   PT End of Session - 11/02/19 0929    Visit Number 28    Date for PT Re-Evaluation 11/23/19   Progress note to report #84 (last on #84  09/14/19)   Authorization Type Medicare    PT Start Time 0905    PT Stop Time 1000    PT Time Calculation (min) 55 min    Activity Tolerance Patient tolerated treatment well;No increased pain    Behavior During Therapy WFL for tasks assessed/performed           Past Medical History:  Diagnosis Date  . Arthritis   . COPD (chronic obstructive pulmonary disease) ([place])   . Family history of adverse reaction to anesthesia    father was slow to wake up  . Fuch's endothelial dystrophy   . Hyperlipidemia   . Hypothyroidism   . PONV (postoperative nausea and vomiting)    slow to wake up and PONV    Past Surgical History:  Procedure Laterality Date  . APPENDECTOMY    . BACK SURGERY  09/26/2018   L4-5 PLIF by Dr. Arnoldo Morale  . CARDIAC CATHETERIZATION    . CHOLECYSTECTOMY    . EYE SURGERY    . FOOT SURGERY Right    pin removed left  . laser vein surgery    . NASAL SINUS SURGERY    . RIGHT/LEFT HEART CATH AND CORONARY ANGIOGRAPHY N/A 12/06/2017   Procedure: RIGHT/LEFT HEART CATH AND CORONARY ANGIOGRAPHY;  Surgeon: Yolonda Kida, MD;  Location: [place] [facility];  Service: Cardiovascular;  Laterality: N/A;  . TONSILLECTOMY      There were no vitals filed for this visit.   Subjective Assessment - 11/02/19 0933    Subjective Pt reported today, she woke up feeling less pain but not yesterday.              [facility] PT Assessment - 11/02/19 1002      Observation/Other Assessments   Observations less cues for  stretches. more ROM        Ambulation/Gait   Ambulation Distance (Feet) 575 Feet   4:08,    Assistive device Other (Comment)   hiking poles    Gait Comments more upright posture, more reciprocal movement in gait                          [facility] Adult PT Treatment/Exercise - 11/02/19 1002      Therapeutic Activites    Other Therapeutic Activities explained the importance of rest and napping to manage arthritis and her Sx       Neuro Re-ed    Neuro Re-ed Details  cued for stretching techniques for pre and post walking                      PT Short Term Goals - 06/08/19 1133      PT SHORT TERM GOAL #1   Title Patient will demonstrate improved pelvic alignment and balance of musculature surrounding the pelvis to facilitate decreased PFM spasms and decrease pelvic pain.    Baseline Pt. demonstrates severe posterior pelvic tilt, L thoracic/R lumbar scoliosis, and R anterior/L posterior  pelvic obliquity. Pt. has demonstrated improved pelvic alignment and posture but has not been able to sustain long-term due to continued lack of postural strength.    Time 5    Period Weeks    Status Achieved    Target Date 03/06/19      PT SHORT TERM GOAL #2   Title Patient will demonstrate a coordinated contraction, relaxation, and bulge of the pelvic floor muscles to demonstrate functional recruitment and motion and allow for bladder emptying.    Baseline Pt. is unable to fully empty bladder and describes pain with catheterization, demonstrating high tone of PFM.    Time 5    Period Weeks    Status Achieved    Target Date 03/06/19      PT SHORT TERM GOAL #3   Title Patient will demonstrate improved sitting and standing posture to demonstrate learning and decrease stress on the pelvic floor with functional activity.    Baseline Pt. demonstrates severe posterior pelvic tilt, L thoracic/R lumbar scoliosis, and R anterior/L posterior pelvic obliquity. 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 Will be able to cath without pain and will be cleared by physiscian to d/c self-cath due to decreased residual volume with bladder emptying.    Baseline required to cath 3 times per day, painful each time.    Time 10    Period Weeks    Status Achieved      PT LONG TERM GOAL #4   Title Patient will score less than or equal to 40% on the Female NIH-CPSI and 30% on the [facility] to demonstrate a reduction in pain, urinary symptoms, and an improved quality of life.    Baseline Female NIH-CPSI: 38/43 (88%) , PDI: 45/75 (60%); PDI 44/75 (59%) and Female NIH-CPSI: 27/43 (64%) on 02-08-19 , Female NIH-CPSI: 17% on 03-26-19    Time 10    Period Weeks    Status Achieved      PT LONG TERM GOAL #5   Title Patient will demonstrate increased step length with reciprical arm swing, and demonstrate no scissoring steps.    Baseline Pt currently demonstrated scissoring steppage on R LE, increased trunk rotation and deep core engagement  with reciprical walking poles.    Time 10    Period Weeks    Status Achieved       PT LONG TERM GOAL #6   Title Pt will increase her distance on the 6MWT from 747 feet  to  > 900 feet  order to progress with aerobic exercises and walking longer distances in the community    Time 8    Period Weeks    Status On-going      PT LONG TERM GOAL #7   Title Pt will report waking up > 2 mornings in a row with pain level < 5-6/10 in order to demo maintanence of Tx benefits and more balance of mm tensions achieved with HEP, shoe lift to improve QOL    Baseline 7-8/10 level and benefits from Tx lasted only one morning  (03-19-19 and 03-23-19)    Time 10    Period Weeks    Status Partially Met      PT LONG TERM GOAL #8   Title Pt will be able to demo increased PF strength with unilateral UE support on wall from B 3/5 15 reps in order to achieve stronger push off in gait and walk longer distance    Baseline B, L 7 reps  2/5, 10 reps R 3/5  ( 12/17: 15 reps R , 13 reps L, 06/01/19: R 20 reps, L 18 reps  )    Time 10    Period Weeks    Status Achieved      PT LONG TERM GOAL  #9   TITLE Pt will demo shoulder abduction > 120 deg B with improved alignment of thorax over pelvis without manual cues in order to demo proper co-activation of B equal weight bearing in BLE, and decreased thoracic kyhposis/ decreased thoracic rotation 2/2 scoliosis in order to lift plates overhead cabinets/ decreased risk for    Time 10    Period Weeks    Status Achieved      PT LONG TERM GOAL  #10   TITLE Pt will demo increased height from 63" to > 63 1/2 " and maintain 63 1/2" across 3 weeks ( once a week measurement) and forward head posture improve ( decreased distance from wall to earlobe 17 cm from earlobe to wall to > 12 cm)  in order to be ready to progress to PNF exercise with less shoulder pain and lengthen/ strengthen concave curve on R 2/2 scoliosis to minimize L lateral thigh tightness  (07/06/19 : 63 3/4",  08/10/19: 64"   )    Time 8    Period Weeks    Status Achieved      PT LONG TERM GOAL   #11   TITLE Pt will demo single UE support with lunge position exercises/ yoga poses  in order to demo improved balance and IND with flexibility routine    Baseline BUE support    Time 10    Period Weeks    Status On-going      PT LONG TERM GOAL  #12   TITLE Pt will demo L trunk rotation in R sidelying with zero report of pain in order to progress with longer walking endurance    Time 10    Period Weeks    Status Achieved      PT LONG TERM GOAL  #13   TITLE Pt will demo increased  shoulder abd R against wall in mini squat from 55 deg to > 75 deg in  order to demo increased thoracic extension and intercostal lengthening for functional BUE mobility without compensations.  ( 4/2: 88 deg R )    Time 8    Period Weeks    Status Achieved      PT LONG TERM GOAL  #14   TITLE Pt will demo less forward head posture ( earlobe to wall <13  cm) nd maintain height of 64  1/4" for 1 month in order to stand taller w/ less kyphosis and less deviations to spine to walk longer distances    Baseline ( earlobe to wall 16 cm)  ( 64  1/14" on 09/14/19  )     Time 10    Period Weeks    Status On-going                 Plan - 11/02/19 1244    Clinical Impression Statement Pt continues to maintain faster walking speed with hiking poles and customized HEP that has addressed the thoracic shift 2/2 scoliosis. Provided explanation to focus on consistency of rest/ napping to minimize tightness of body/ Sx, regularity of stretching to maintain mobility. Added cues for hamstring stretch to optimize knee ext. Pt continues to require cues for stretches. Plan to progress pt to walking with hiking poles double the distance next week with regular practice of stretching pre and post walking in order to progress pt towards goals. Pt continues to benefit from skilled PT    Personal Factors and Comorbidities Age;Comorbidity 3+    Comorbidities Osteoporosis, scoliosis, Arthritis, COPD, recent lumbosacral fusion for  spondylolisthesis.    Examination-Activity Limitations Toileting;Sit;Stand;Bend;Lift;Carry    Examination-Participation Restrictions Interpersonal Relationship;Yard Work;Cleaning;Laundry    Stability/Clinical Decision Making Unstable/Unpredictable    Rehab Potential Good    PT Frequency 2x / week    PT Duration Other (comment)   20   PT Treatment/Interventions ADLs/Self Care Home Management;Aquatic Therapy;Moist Heat;Electrical Stimulation;Traction;Therapeutic activities;Functional mobility training;Stair training;Gait training;Therapeutic exercise;Balance training;Neuromuscular re-education;Patient/family education;Manual techniques;Dry needling;Passive range of motion;Scar mobilization;Taping    Consulted and Agree with Plan of Care Patient           Patient will benefit from skilled therapeutic intervention in order to improve the following deficits and impairments:  Abnormal gait, Decreased balance, Increased muscle spasms, Decreased range of motion, Decreased scar mobility, Improper body mechanics, Decreased coordination, Decreased strength, Increased fascial restricitons, Impaired flexibility, Postural dysfunction, Pain  Visit Diagnosis: Other idiopathic scoliosis, thoracolumbar region  Chronic right shoulder pain  Abnormal posture  Other muscle spasm     Problem List Patient Active Problem List   Diagnosis Date Noted  . Chronic UTI 12/06/2018  . Status post lumbar surgery 12/06/2018  . Spondylolisthesis, lumbar region 09/26/2018  . Aortic atherosclerosis ([place]) 08/31/2018  . CAD (coronary artery disease) 02/23/2018  . Advanced care planning/counseling discussion 03/28/2017  . Varicose veins of both lower extremities with complications 74/25/9563  . Arthritis 12/09/2016  . Vaginal atrophy 09/10/2015  . Menopausal hot flushes 09/10/2015  . Onychomycosis due to dermatophyte 04/07/2015  . Bursitis of right shoulder 02/26/2015  . Environmental and seasonal allergies  02/26/2015  . Other allergic rhinitis 02/26/2015  . Osteoporosis 11/26/2014  . COPD (chronic obstructive pulmonary disease) ([place]) 11/26/2014  . Fuchs' corneal dystrophy 11/26/2014  . Benign hypertensive renal disease 11/26/2014  . Hyperlipidemia 11/26/2014  . Chronic kidney disease, stage III (moderate) ([place]) 11/26/2014  . Hypothyroidism 11/26/2014  . Endothelial corneal dystrophy 11/26/2014    Jerl Mina ,PT, DPT, E-RYT  11/02/2019, 12:47 PM  [facility]  [place] MAIN [facility] SERVICES [address] [place], [place], [address] Phone: [phone]   Fax:  [phone]  Name: Shaylynn C Zabriskie MRN: [id number] Date of Birth: November 09, 1945

## 2019-11-02 NOTE — Patient Instructions (Signed)
Work on 10-20 nap daily    Stretches daily  ( use long towel for hamstring stretch )

## 2019-11-05 ENCOUNTER — Ambulatory Visit: Payer: Medicare Other | Admitting: Physical Therapy

## 2019-11-05 ENCOUNTER — Other Ambulatory Visit: Payer: Self-pay

## 2019-11-05 DIAGNOSIS — G8929 Other chronic pain: Secondary | ICD-10-CM

## 2019-11-05 DIAGNOSIS — M4125 Other idiopathic scoliosis, thoracolumbar region: Secondary | ICD-10-CM

## 2019-11-05 DIAGNOSIS — M25511 Pain in right shoulder: Secondary | ICD-10-CM | POA: Diagnosis not present

## 2019-11-05 DIAGNOSIS — R293 Abnormal posture: Secondary | ICD-10-CM

## 2019-11-05 DIAGNOSIS — M62838 Other muscle spasm: Secondary | ICD-10-CM

## 2019-11-05 NOTE — Patient Instructions (Addendum)
Pre and post walking stretches: Maintain arch in your back , no slouching    - reach and push leg back - quad stretch with towel at the ankle, pulling knee back  - knee bent over the bed, body turned at 45 deg ( figure -4 stretch)  - hamstring with strap on [place], toes pointed up  -adductor stretch, thigh out , knee straight, leaning to that thigh     ____

## 2019-11-05 NOTE — Therapy (Signed)
[place] [facility] SERVICES [address] [place], [place], [address] Phone: [phone]   Fax:  [phone]  Physical Therapy Treatment  Patient Details  Name: Julia Deleon MRN: [id number] Date of Birth: 04-Jan-1946 Referring Provider (PT): Dr. Maryan Puls   Encounter Date: 11/05/2019   PT End of Session - 11/05/19 1146    Visit Number 95    Date for PT Re-Evaluation 11/23/19   Progress note to report #84 (last on #84  09/14/19)   Authorization Type Medicare    PT Start Time 1107    PT Stop Time 1205    PT Time Calculation (min) 58 min    Activity Tolerance Patient tolerated treatment well;No increased pain    Behavior During Therapy WFL for tasks assessed/performed           Past Medical History:  Diagnosis Date  . Arthritis   . COPD (chronic obstructive pulmonary disease) ([place])   . Family history of adverse reaction to anesthesia    father was slow to wake up  . Fuch's endothelial dystrophy   . Hyperlipidemia   . Hypothyroidism   . PONV (postoperative nausea and vomiting)    slow to wake up and PONV    Past Surgical History:  Procedure Laterality Date  . APPENDECTOMY    . BACK SURGERY  09/26/2018   L4-5 PLIF by Dr. Arnoldo Morale  . CARDIAC CATHETERIZATION    . CHOLECYSTECTOMY    . EYE SURGERY    . FOOT SURGERY Right    pin removed left  . laser vein surgery    . NASAL SINUS SURGERY    . RIGHT/LEFT HEART CATH AND CORONARY ANGIOGRAPHY N/A 12/06/2017   Procedure: RIGHT/LEFT HEART CATH AND CORONARY ANGIOGRAPHY;  Surgeon: Yolonda Kida, MD;  Location: [facility];  Service: Cardiovascular;  Laterality: N/A;  . TONSILLECTOMY      There were no vitals filed for this visit.   Subjective Assessment - 11/05/19 1110    Subjective Pt has returned to chiropractic Tx for decompression of the spine 1 x week for the past 2 weeks. Pt slept well last night             [facility] PT Assessment - 11/05/19 1147      Palpation    Palpation comment tightness at L upper quadrant of UE , medial scapula       Ambulation/Gait   Ambulation Distance (Feet) 968 Feet   6:47    Assistive device --    hiking poles    Gait Comments more upright posture, more thoracic extension with poles , more reciprocal movement in gait                          [facility] Adult PT Treatment/Exercise - 11/05/19 1147      Therapeutic Activites    Other Therapeutic Activities gait training with hiking poles       Neuro Re-ed    Neuro Re-ed Details  cued for adductor , hamstring stretch with stretch and reviewed stretches       Manual Therapy   Manual therapy comments STM/MWM at upper quadrant of UE L , distraction of BLE                     PT Short Term Goals - 06/08/19 1133      PT SHORT TERM GOAL #1   Title Patient will demonstrate  improved pelvic alignment and balance of musculature surrounding the pelvis to facilitate decreased PFM spasms and decrease pelvic pain.    Baseline Pt. demonstrates severe posterior pelvic tilt, L thoracic/R lumbar scoliosis, and R anterior/L posterior pelvic obliquity. Pt. has demonstrated improved pelvic alignment and posture but has not been able to sustain long-term due to continued lack of postural strength.    Time 5    Period Weeks    Status Achieved    Target Date 03/06/19      PT SHORT TERM GOAL #2   Title Patient will demonstrate a coordinated contraction, relaxation, and bulge of the pelvic floor muscles to demonstrate functional recruitment and motion and allow for bladder emptying.    Baseline Pt. is unable to fully empty bladder and describes pain with catheterization, demonstrating high tone of PFM.    Time 5    Period Weeks    Status Achieved    Target Date 03/06/19      PT SHORT TERM GOAL #3   Title Patient will demonstrate improved sitting and standing posture to demonstrate learning and decrease stress on the pelvic floor with functional activity.    Baseline  Pt. demonstrates severe posterior pelvic tilt, L thoracic/R lumbar scoliosis, and R anterior/L posterior pelvic obliquity. 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 Will be able to cath without pain and will be cleared by physiscian to d/c self-cath due to decreased residual volume with bladder emptying.    Baseline required to cath 3 times per day, painful each time.    Time 10    Period Weeks    Status Achieved      PT LONG TERM GOAL #4   Title Patient will score less than or equal to 40% on the Female NIH-CPSI and 30% on the [facility] to demonstrate a reduction in pain, urinary symptoms, and an improved quality of life.    Baseline Female NIH-CPSI: 38/43 (88%) , PDI: 45/75 (60%); PDI 44/75 (59%) and Female NIH-CPSI: 27/43 (64%) on 02-08-19 , Female NIH-CPSI: 17% on 03-26-19    Time 10    Period Weeks    Status Achieved      PT LONG TERM GOAL #5  Title Patient will demonstrate increased step length with reciprical arm swing, and demonstrate no scissoring steps.    Baseline Pt currently demonstrated  scissoring steppage on R LE, increased trunk rotation and deep core engagement with reciprical walking poles.    Time 10    Period Weeks    Status Achieved      PT LONG TERM GOAL #6   Title Pt will increase her distance on the 6MWT from 747 feet  to  > 900 feet  order to progress with aerobic exercises and walking longer distances in the community    Time 8    Period Weeks    Status On-going      PT LONG TERM GOAL #7   Title Pt will report waking up > 2 mornings in a row with pain level < 5-6/10 in order to demo maintanence of Tx benefits and more balance of mm tensions achieved with HEP, shoe lift to improve QOL    Baseline 7-8/10 level and benefits from Tx lasted only one morning  (03-19-19 and 03-23-19)    Time 10    Period Weeks    Status Partially Met      PT LONG TERM GOAL #8   Title Pt will be able to demo increased PF strength with unilateral UE support on wall from B 3/5 15 reps in order to achieve stronger push off in gait and walk longer distance    Baseline B, L 7 reps  2/5, 10 reps R 3/5  ( 12/17: 15 reps R , 13 reps L, 06/01/19: R 20 reps, L 18 reps  )    Time 10    Period Weeks    Status Achieved      PT LONG TERM GOAL  #9   TITLE Pt will demo shoulder abduction > 120 deg B with improved alignment of thorax over pelvis without manual cues in order to demo proper co-activation of B equal weight bearing in BLE, and decreased thoracic kyhposis/ decreased thoracic rotation 2/2 scoliosis in order to lift plates overhead cabinets/ decreased risk for    Time 10    Period Weeks    Status Achieved      PT LONG TERM GOAL  #10   TITLE Pt will demo increased height from 63" to > 63 1/2 " and maintain 63 1/2" across 3 weeks ( once a week measurement) and forward head posture improve ( decreased distance from wall to earlobe 17 cm from earlobe to wall to > 12 cm)  in order to be ready to progress to PNF exercise with less shoulder pain and lengthen/ strengthen concave curve on R 2/2  scoliosis to minimize L lateral thigh tightness  (07/06/19 : 63 3/4",  08/10/19: 64"   )    Time 8    Period Weeks    Status Achieved      PT LONG TERM GOAL  #11   TITLE Pt will demo single UE support with lunge position exercises/ yoga poses  in order to demo improved balance and IND with flexibility routine    Baseline BUE support    Time 10    Period Weeks    Status On-going      PT LONG TERM GOAL  #12   TITLE Pt will demo L trunk rotation in R sidelying with zero report of pain in order to progress with longer walking endurance    Time 10    Period Weeks    Status Achieved  PT LONG TERM GOAL  #13   TITLE Pt will demo increased  shoulder abd R against wall in mini squat from 55 deg to > 75 deg in order to demo increased thoracic extension and intercostal lengthening for functional BUE mobility without compensations.  ( 4/2: 88 deg R )    Time 8    Period Weeks    Status Achieved      PT LONG TERM GOAL  #14   TITLE Pt will demo less forward head posture ( earlobe to wall <13  cm) nd maintain height of 64  1/4" for 1 month in order to stand taller w/ less kyphosis and less deviations to spine to walk longer distances    Baseline ( earlobe to wall 16 cm)  ( 64  1/14" on 09/14/19  )     Time 10    Period Weeks    Status On-going                 Plan - 11/05/19 1148    Clinical Impression Statement Pt continues to gain IND with the pre and post walking stretches but still required some cues. Today, pt increased her walking distance from 575 ft to 968 ft. Pt completed this distance in 6:48 which signfies much improvement. Pt demo'd longer strides and more thoracic extensions with use of hiking poles and less trunk lean. Pt continues to benefit from skilled PT      Personal Factors and Comorbidities Age;Comorbidity 3+    Comorbidities Osteoporosis, scoliosis, Arthritis, COPD, recent lumbosacral fusion for spondylolisthesis.    Examination-Activity Limitations  Toileting;Sit;Stand;Bend;Lift;Carry    Examination-Participation Restrictions Interpersonal Relationship;Yard Work;Cleaning;Laundry    Stability/Clinical Decision Making Unstable/Unpredictable    Rehab Potential Good    PT Frequency 2x / week    PT Duration Other (comment)   20   PT Treatment/Interventions ADLs/Self Care Home Management;Aquatic Therapy;Moist Heat;Electrical Stimulation;Traction;Therapeutic activities;Functional mobility training;Stair training;Gait training;Therapeutic exercise;Balance training;Neuromuscular re-education;Patient/family education;Manual techniques;Dry needling;Passive range of motion;Scar mobilization;Taping    Consulted and Agree with Plan of Care Patient           Patient will benefit from skilled therapeutic intervention in order to improve the following deficits and impairments:  Abnormal gait, Decreased balance, Increased muscle spasms, Decreased range of motion, Decreased scar mobility, Improper body mechanics, Decreased coordination, Decreased strength, Increased fascial restricitons, Impaired flexibility, Postural dysfunction, Pain  Visit Diagnosis: Other idiopathic scoliosis, thoracolumbar region  Chronic right shoulder pain  Abnormal posture  Other muscle spasm     Problem List Patient Active Problem List   Diagnosis Date Noted  . Chronic UTI 12/06/2018  . Status post lumbar surgery 12/06/2018  . Spondylolisthesis, lumbar region 09/26/2018  . Aortic atherosclerosis ([place]) 08/31/2018  . CAD (coronary artery disease) 02/23/2018  . Advanced care planning/counseling discussion 03/28/2017  . Varicose veins of both lower extremities with complications 02/63/7858  . Arthritis 12/09/2016  . Vaginal atrophy 09/10/2015  . Menopausal hot flushes 09/10/2015  . Onychomycosis due to dermatophyte 04/07/2015  . Bursitis of right shoulder 02/26/2015  . Environmental and seasonal allergies 02/26/2015  . Other allergic rhinitis 02/26/2015  .  Osteoporosis 11/26/2014  . COPD (chronic obstructive pulmonary disease) ([place]) 11/26/2014  . Fuchs' corneal dystrophy 11/26/2014  . Benign hypertensive renal disease 11/26/2014  . Hyperlipidemia 11/26/2014  . Chronic kidney disease, stage III (moderate) ([place]) 11/26/2014  . Hypothyroidism 11/26/2014  . Endothelial corneal dystrophy 11/26/2014    Jerl Mina ,PT, DPT, E-RYT  11/05/2019, 12:40 PM  [facility]  [place] [facility] SERVICES [address] [place], [place], [address] Phone: [phone]   Fax:  [phone]  Name: Julia Deleon MRN: [id number] Date of Birth: March 25, 1946

## 2019-11-06 ENCOUNTER — Encounter: Payer: Medicare Other | Admitting: Physical Therapy

## 2019-11-08 ENCOUNTER — Ambulatory Visit: Payer: Medicare Other | Attending: Urology | Admitting: Physical Therapy

## 2019-11-08 ENCOUNTER — Other Ambulatory Visit: Payer: Self-pay

## 2019-11-08 ENCOUNTER — Encounter: Payer: Medicare Other | Admitting: Physical Therapy

## 2019-11-08 DIAGNOSIS — M62838 Other muscle spasm: Secondary | ICD-10-CM

## 2019-11-08 DIAGNOSIS — M25511 Pain in right shoulder: Secondary | ICD-10-CM | POA: Diagnosis not present

## 2019-11-08 DIAGNOSIS — M4125 Other idiopathic scoliosis, thoracolumbar region: Secondary | ICD-10-CM | POA: Insufficient documentation

## 2019-11-08 DIAGNOSIS — G8929 Other chronic pain: Secondary | ICD-10-CM | POA: Diagnosis not present

## 2019-11-08 DIAGNOSIS — R293 Abnormal posture: Secondary | ICD-10-CM | POA: Insufficient documentation

## 2019-11-08 NOTE — Therapy (Signed)
[place] MAIN [facility] SERVICES [address] [place], [place], [address] Phone: [phone]   Fax:  [phone]  Physical Therapy Treatment  Patient Details  Name: Julia Deleon MRN: [id number] Date of Birth: [birth year] Referring Provider (PT): Dr. Maryan Puls   Encounter Date: 11/08/2019   PT End of Session - 11/08/19 1230    Visit Number 64    Date for PT Re-Evaluation 11/23/19   Progress note to report #84 (last on #84  09/14/19)   Authorization Type Medicare    PT Start Time 1107    PT Stop Time 1205    PT Time Calculation (min) 58 min    Activity Tolerance Patient tolerated treatment well;No increased pain    Behavior During Therapy WFL for tasks assessed/performed           Past Medical History:  Diagnosis Date  . Arthritis   . COPD (chronic obstructive pulmonary disease) ([place])   . Family history of adverse reaction to anesthesia    father was slow to wake up  . Fuch's endothelial dystrophy   . Hyperlipidemia   . Hypothyroidism   . PONV (postoperative nausea and vomiting)    slow to wake up and PONV    Past Surgical History:  Procedure Laterality Date  . APPENDECTOMY    . BACK SURGERY  09/26/2018   L4-5 PLIF by Dr. Arnoldo Morale  . CARDIAC CATHETERIZATION    . CHOLECYSTECTOMY    . EYE SURGERY    . FOOT SURGERY Right    pin removed left  . laser vein surgery    . NASAL SINUS SURGERY    . RIGHT/LEFT HEART CATH AND CORONARY ANGIOGRAPHY N/A 12/06/2017   Procedure: RIGHT/LEFT HEART CATH AND CORONARY ANGIOGRAPHY;  Surgeon: Yolonda Kida, MD;  Location: [facility];  Service: Cardiovascular;  Laterality: N/A;  . TONSILLECTOMY      There were no vitals filed for this visit.   Subjective Assessment - 11/08/19 1115    Subjective Pt reported feeling tired after last session but woke up feeling okay. Pt saw her vein doctor and her veins are looking good.              [facility] PT Assessment - 11/08/19 1207       Palpation   Palpation comment peroneal longus L tightness at superior attachments       Ambulation/Gait   Gait Comments more reciporcal rotation of pelvis, less R trunk lean, wider BOS                           [facility] Adult PT Treatment/Exercise - 11/08/19 1207      Ambulation/Gait   Ambulation Distance (Feet) 1079 Feet    Assistive device --    hiking poles      Therapeutic Activites    Other Therapeutic Activities gait training with hiking poles       Neuro Re-ed    Neuro Re-ed Details  cued for HEP stretches       Manual Therapy   Manual therapy comments distraction at L LE, STM/MWM at peroneal longus L                     PT Short Term Goals - 06/08/19 1133      PT SHORT TERM GOAL #1   Title Patient will demonstrate improved pelvic alignment and balance of musculature surrounding the pelvis to facilitate  decreased PFM spasms and decrease pelvic pain.    Baseline Pt. demonstrates severe posterior pelvic tilt, L thoracic/R lumbar scoliosis, and R anterior/L posterior pelvic obliquity. Pt. has demonstrated improved pelvic alignment and posture but has not been able to sustain long-term due to continued lack of postural strength.    Time 5    Period Weeks    Status Achieved    Target Date 03/06/19      PT SHORT TERM GOAL #2   Title Patient will demonstrate a coordinated contraction, relaxation, and bulge of the pelvic floor muscles to demonstrate functional recruitment and motion and allow for bladder emptying.    Baseline Pt. is unable to fully empty bladder and describes pain with catheterization, demonstrating high tone of PFM.    Time 5    Period Weeks    Status Achieved    Target Date 03/06/19      PT SHORT TERM GOAL #3   Title Patient will demonstrate improved sitting and standing posture to demonstrate learning and decrease stress on the pelvic floor with functional activity.    Baseline Pt. demonstrates severe posterior pelvic tilt, L  thoracic/R lumbar scoliosis, and R anterior/L posterior pelvic obliquity. 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 Will be able to cath without pain and will be cleared by physiscian to d/c self-cath due to decreased residual volume with bladder emptying.    Baseline required to cath 3 times per day, painful each time.    Time 10    Period Weeks    Status Achieved      PT LONG TERM GOAL #4   Title Patient will score less than or equal to 40% on the Female NIH-CPSI and 30% on the [facility] to demonstrate a reduction in pain, urinary symptoms, and an improved quality of life.    Baseline Female NIH-CPSI: 38/43 (88%) , PDI: 45/75 (60%); PDI 44/75 (59%) and Female NIH-CPSI: 27/43 (64%) on 02-08-19 , Female NIH-CPSI: 17% on 03-26-19    Time 10    Period Weeks    Status Achieved      PT LONG TERM GOAL #5   Title Patient will demonstrate increased step length with reciprical arm  swing, and demonstrate no scissoring steps.    Baseline Pt currently demonstrated scissoring steppage on R LE, increased trunk rotation  and deep core engagement with reciprical walking poles.    Time 10    Period Weeks    Status Achieved      PT LONG TERM GOAL #6   Title Pt will increase her distance on the 6MWT from 747 feet  to  > 900 feet  order to progress with aerobic exercises and walking longer distances in the community    Time 8    Period Weeks    Status On-going      PT LONG TERM GOAL #7   Title Pt will report waking up > 2 mornings in a row with pain level < 5-6/10 in order to demo maintanence of Tx benefits and more balance of mm tensions achieved with HEP, shoe lift to improve QOL    Baseline 7-8/10 level and benefits from Tx lasted only one morning  (03-19-19 and 03-23-19)    Time 10    Period Weeks    Status Partially Met      PT LONG TERM GOAL #8   Title Pt will be able to demo increased PF strength with unilateral UE support on wall from B 3/5 15 reps in order to achieve stronger push off in gait and walk longer distance    Baseline B, L 7 reps  2/5, 10 reps R 3/5  ( 12/17: 15 reps R , 13 reps L, 06/01/19: R 20 reps, L 18 reps  )    Time 10    Period Weeks    Status Achieved      PT LONG TERM GOAL  #9   TITLE Pt will demo shoulder abduction > 120 deg B with improved alignment of thorax over pelvis without manual cues in order to demo proper co-activation of B equal weight bearing in BLE, and decreased thoracic kyhposis/ decreased thoracic rotation 2/2 scoliosis in order to lift plates overhead cabinets/ decreased risk for    Time 10    Period Weeks    Status Achieved      PT LONG TERM GOAL  #10   TITLE Pt will demo increased height from 63" to > 63 1/2 " and maintain 63 1/2" across 3 weeks ( once a week measurement) and forward head posture improve ( decreased distance from wall to earlobe 17 cm from earlobe to wall to > 12 cm)  in order to be ready to progress to PNF exercise with less shoulder pain and lengthen/ strengthen concave curve on R 2/2 scoliosis to minimize L lateral thigh tightness   (07/06/19 : 63 3/4",  08/10/19: 64"   )    Time 8    Period Weeks    Status Achieved      PT LONG TERM GOAL  #11   TITLE Pt will demo single UE support with lunge position exercises/ yoga poses  in order to demo improved balance and IND with flexibility routine    Baseline BUE support    Time 10    Period Weeks    Status On-going      PT LONG TERM GOAL  #12   TITLE Pt will demo L trunk rotation in R sidelying with zero report of pain in order to progress with longer walking endurance    Time 10    Period Weeks    Status Achieved      PT LONG TERM GOAL  #  13   TITLE Pt will demo increased  shoulder abd R against wall in mini squat from 55 deg to > 75 deg in order to demo increased thoracic extension and intercostal lengthening for functional BUE mobility without compensations.  ( 4/2: 88 deg R )    Time 8    Period Weeks    Status Achieved      PT LONG TERM GOAL  #14   TITLE Pt will demo less forward head posture ( earlobe to wall <13  cm) nd maintain height of 64  1/4" for 1 month in order to stand taller w/ less kyphosis and less deviations to spine to walk longer distances    Baseline ( earlobe to wall 16 cm)  ( 64  1/14" on 09/14/19  )     Time 10    Period Weeks    Status On-going                 Plan - 11/08/19 1230    Clinical Impression Statement Pt achieved ambulation with hiking poles across 1079 ft today within 7 minutes  without report of needing to stop and rest. This is an improvement from last session during which she was only able to walk 968 feet in 6:47. Educated pt to walk with hiking poles while at the beach as they will provide stability when walking on the sand. Encouraged pt to walk for 6 minutes regularly daily to increase endurance and to continue with stretches prior and after walking.  Pt is demonstrating more IND with stretches was less cues.  Pt continues to benefit from skilled PT    Personal Factors and Comorbidities Age;Comorbidity 3+     Comorbidities Osteoporosis, scoliosis, Arthritis, COPD, recent lumbosacral fusion for spondylolisthesis.    Examination-Activity Limitations Toileting;Sit;Stand;Bend;Lift;Carry    Examination-Participation Restrictions Interpersonal Relationship;Yard Work;Cleaning;Laundry    Stability/Clinical Decision Making Unstable/Unpredictable    Rehab Potential Good    PT Frequency 2x / week    PT Duration Other (comment)   20   PT Treatment/Interventions ADLs/Self Care Home Management;Aquatic Therapy;Moist Heat;Electrical Stimulation;Traction;Therapeutic activities;Functional mobility training;Stair training;Gait training;Therapeutic exercise;Balance training;Neuromuscular re-education;Patient/family education;Manual techniques;Dry needling;Passive range of motion;Scar mobilization;Taping    Consulted and Agree with Plan of Care Patient           Patient will benefit from skilled therapeutic intervention in order to improve the following deficits and impairments:  Abnormal gait, Decreased balance, Increased muscle spasms, Decreased range of motion, Decreased scar mobility, Improper body mechanics, Decreased coordination, Decreased strength, Increased fascial restricitons, Impaired flexibility, Postural dysfunction, Pain  Visit Diagnosis: No diagnosis found.     Problem List Patient Active Problem List   Diagnosis Date Noted  . Chronic UTI 12/06/2018  . Status post lumbar surgery 12/06/2018  . Spondylolisthesis, lumbar region 09/26/2018  . Aortic atherosclerosis ([place]) 08/31/2018  . CAD (coronary artery disease) 02/23/2018  . Advanced care planning/counseling discussion 03/28/2017  . Varicose veins of both lower extremities with complications 42/68/3419  . Arthritis 12/09/2016  . Vaginal atrophy 09/10/2015  . Menopausal hot flushes 09/10/2015  . Onychomycosis due to dermatophyte 04/07/2015  . Bursitis of right shoulder 02/26/2015  . Environmental and seasonal allergies 02/26/2015  . Other  allergic rhinitis 02/26/2015  . Osteoporosis 11/26/2014  . COPD (chronic obstructive pulmonary disease) ([place]) 11/26/2014  . Fuchs' corneal dystrophy 11/26/2014  . Benign hypertensive renal disease 11/26/2014  . Hyperlipidemia 11/26/2014  . Chronic kidney disease, stage III (moderate) ([place]) 11/26/2014  . Hypothyroidism 11/26/2014  .  Endothelial corneal dystrophy 11/26/2014    Jerl Mina ,PT, DPT, E-RYT  11/08/2019, 12:32 PM  [place] MAIN [facility] SERVICES [address] [place], [place], [address] Phone: [phone]   Fax:  [phone]  Name: Khaleelah C Schweikert MRN: [id number] Date of Birth: 02-09-1946

## 2019-11-09 ENCOUNTER — Encounter: Payer: Medicare Other | Admitting: Physical Therapy

## 2019-11-13 ENCOUNTER — Encounter: Payer: Medicare Other | Admitting: Physical Therapy

## 2019-11-15 ENCOUNTER — Ambulatory Visit: Payer: Medicare Other | Admitting: Obstetrics and Gynecology

## 2019-11-16 ENCOUNTER — Encounter: Payer: Medicare Other | Admitting: Physical Therapy

## 2019-11-16 DIAGNOSIS — Z803 Family history of malignant neoplasm of breast: Secondary | ICD-10-CM | POA: Insufficient documentation

## 2019-11-16 NOTE — Progress Notes (Signed)
Julia Roys, Julia Deleon   Chief Complaint  Patient presents with  . Gynecologic Exam    HPI:      Julia Deleon is a 74 y.o. [id number] whose LMP was No LMP recorded (lmp unknown). Patient is postmenopausal., presents today for repeat pap smear. S/p ASCUS pap last yr, no hx of abn. Repeat due this yr.  She is not sex active due to back surg last yr. Using vitamin E supp for vaginal dryness per pelvic PT. Replens is irritating to pt. Hasn't tried coconut oil.  Pt on bioidentical HRT. Take prog 6 nights wkly and estradiol twice wkly with sx relief of vasomotor sx. Gets Rx at [facility]. Has rebound sx if tries to wean off. Doesn't need RF currently.  Sister and pat grt aunt with breast cancer, doesn't qualify for cancer genetic tesitng. Last mammo normal 7/20. Pt current on colonoscopy. Due after 10 yrs, but pt doesn't remember year it was done, but less than 10 yrs ago.  Followed by PCP.   Past Medical History:  Diagnosis Date  . Arthritis   . COPD (chronic obstructive pulmonary disease) ([place])   . Family history of adverse reaction to anesthesia    father was slow to wake up  . Fuch's endothelial dystrophy   . Hyperlipidemia   . Hypothyroidism   . PONV (postoperative nausea and vomiting)    slow to wake up and PONV    Past Surgical History:  Procedure Laterality Date  . APPENDECTOMY    . BACK SURGERY  09/26/2018   L4-5 PLIF by Dr. Arnoldo Morale  . CARDIAC CATHETERIZATION    . CHOLECYSTECTOMY    . EYE SURGERY    . FOOT SURGERY Right    pin removed left  . laser vein surgery    . NASAL SINUS SURGERY    . RIGHT/LEFT HEART CATH AND CORONARY ANGIOGRAPHY N/A 12/06/2017   Procedure: RIGHT/LEFT HEART CATH AND CORONARY ANGIOGRAPHY;  Surgeon: Yolonda Kida, MD;  Location: [facility];  Service: Cardiovascular;  Laterality: N/A;  . TONSILLECTOMY      Family History  Problem Relation Age of Onset  . Cancer Mother        lung  . CAD Mother   . Stroke Father   .  Breast cancer Sister        16s  . Varicose Veins Son   . Heart disease Maternal Grandmother   . Cancer Paternal Grandmother        throat  . Emphysema Paternal Grandfather   . Fibromyalgia Sister   . Brain cancer Sister   . Breast cancer Other     Social History   Socioeconomic History  . Marital status: Married    Spouse name: Not on file  . Number of children: Not on file  . Years of education: 4  . Highest education level: Bachelor's degree (e.g., BA, AB, BS)  Occupational History  . Not on file  Tobacco Use  . Smoking status: Never Smoker  . Smokeless tobacco: Never Used  Vaping Use  . Vaping Use: Never used  Substance and Sexual Activity  . Alcohol use: No    Alcohol/week: 0.0 standard drinks  . Drug use: No  . Sexual activity: Not Currently    Birth control/protection: Post-menopausal  Other Topics Concern  . Not on file  Social History Narrative  . Not on file   Social Determinants of Health   Financial Resource Strain:   .  Difficulty of Paying Living Expenses:   Food Insecurity:   . Worried About Charity fundraiser in the Last Year:   . Arboriculturist in the Last Year:   Transportation Needs:   . Film/video editor (Medical):   Marland Kitchen Lack of Transportation (Non-Medical):   Physical Activity:   . Days of Exercise per Week:   . Minutes of Exercise per Session:   Stress:   . Feeling of Stress :   Social Connections:   . Frequency of Communication with Friends and Family:   . Frequency of Social Gatherings with Friends and Family:   . Attends Religious Services:   . Active Member of Clubs or Organizations:   . Attends Archivist Meetings:   Marland Kitchen Marital Status:   Intimate Partner Violence:   . Fear of Current or Ex-Partner:   . Emotionally Abused:   Marland Kitchen Physically Abused:   . Sexually Abused:     Outpatient Medications Prior to Visit  Medication Sig Dispense Refill  . acetaminophen (TYLENOL) 650 MG CR tablet Take 650-1,300 mg by mouth  every 8 (eight) hours as needed for pain.     . Alpha-Lipoic Acid 100 MG TABS Take 100 mg by mouth daily with lunch.    Marland Kitchen azelastine (ASTELIN) 0.1 % nasal spray Place 1 spray into both nostrils daily. Use in each nostril as directed    . chlorzoxazone (PARAFON) 500 MG tablet Take 0.5 tablets (250 mg total) by mouth 3 (three) times daily as needed for muscle spasms. 90 tablet 1  . Cholecalciferol (VITAMIN D3) 2000 units TABS Take 2,000 Units by mouth daily.     . Coenzyme Q10 (COQ-10) 100 MG CAPS Take 100 mg by mouth daily.    Marland Kitchen CRANBERRY PO Take 1 capsule by mouth daily.    . Cyanocobalamin (B-12-SL) 1000 MCG SUBL Place 500 mcg under the tongue 2 (two) times a day.    . D-MANNOSE PO Take by mouth.    Marland Kitchen DHEA 10 MG CAPS Take 15 mg by mouth daily.     Marland Kitchen EPINEPHrine 0.3 mg/0.3 mL IJ SOAJ injection     . Estriol POWD INSERT 0.5GM 2-3 TIMES WEEKLY AS NEEDED 50 g 0  . fexofenadine (ALLEGRA) 60 MG tablet Take 60 mg by mouth daily.    Marland Kitchen glucosamine-chondroitin 500-400 MG tablet Take 1 tablet by mouth 3 (three) times daily.    Marland Kitchen levothyroxine (SYNTHROID) 75 MCG tablet TAKE ONE TABLET EACH MORNING BEFORE BREAKFAST 90 tablet 3  . losartan (COZAAR) 100 MG tablet Take 1 tablet (100 mg total) by mouth daily. 90 tablet 1  . Multiple Vitamin (MULTIVITAMIN WITH MINERALS) TABS tablet Take 1 tablet by mouth every evening.    . Multiple Vitamins-Minerals (LUTEIN-ZEAXANTHIN PO) Take 1 tablet by mouth at bedtime.    . Probiotic CAPS Take 1 capsule by mouth at bedtime.    . Progesterone Micronized (PROGESTERONE, BULK,) POWD Apply 1 application topically See admin instructions. 4% compound- topically 100 g 11  . triamcinolone (NASACORT ALLERGY 24HR) 55 MCG/ACT AERO nasal inhaler Place 2 sprays daily into the nose. 1 Inhaler 12  . TURMERIC PO Take 1 tablet by mouth 2 (two) times a week.     No facility-administered medications prior to visit.      ROS:  Review of Systems  Constitutional: Negative for fever.    Gastrointestinal: Negative for blood in stool, constipation, diarrhea, nausea and vomiting.  Genitourinary: Negative for dyspareunia, dysuria, flank pain, frequency,  hematuria, urgency, vaginal bleeding, vaginal discharge and vaginal pain.  Musculoskeletal: Negative for back pain.  Skin: Negative for rash.   BREAST: No symptoms   OBJECTIVE:   Vitals:  BP 120/80   Ht 5\' 4"  (1.626 m)   Wt 160 lb (72.6 kg)   LMP  (LMP Unknown)   BMI 27.46 kg/m   Physical Exam Vitals reviewed.  Constitutional:      Appearance: She is well-developed.  Pulmonary:     Effort: Pulmonary effort is normal.  Genitourinary:    General: Normal vulva.     Pubic Area: No rash.      Labia:        Right: No rash, tenderness or lesion.        Left: No rash, tenderness or lesion.      Vagina: Normal. No vaginal discharge, erythema or tenderness.     Cervix: Normal.     Uterus: Normal. Not enlarged and not tender.      Adnexa: Right adnexa normal and left adnexa normal.       Right: No mass or tenderness.         Left: No mass or tenderness.    Musculoskeletal:        General: Normal range of motion.     Cervical back: Normal range of motion.  Skin:    General: Skin is warm and dry.  Neurological:     General: No focal deficit present.     Mental Status: She is alert and oriented to person, place, and time.  Psychiatric:        Mood and Affect: Mood normal.        Behavior: Behavior normal.        Thought Content: Thought content normal.        Judgment: Judgment normal.     Assessment/Plan: Atypical squamous cell changes of undetermined significance (ASCUS) on vaginal cytology - Plan: Cytology - PAP; Repeat pap today. Will call with results.   Cervical cancer screening - Plan: Cytology - PAP  Encounter for screening mammogram for malignant neoplasm of breast - Plan: MM 3D SCREEN BREAST BILATERAL; pt to sched mammo  Hormone replacement therapy (HRT)--will have pharm send Rx RF request prn.  Cont for now. F/u prn.   Vasomotor symptoms due to menopause--controlled with HRT    Return if symptoms worsen or fail to improve.  Matylda Fehring B. Eleanora Guinyard, PA-C 11/19/2019 12:10 PM

## 2019-11-19 ENCOUNTER — Other Ambulatory Visit (HOSPITAL_COMMUNITY)
Admission: RE | Admit: 2019-11-19 | Discharge: 2019-11-19 | Disposition: A | Discharge status: Home / Self Care | Payer: Medicare Other | Source: Ambulatory Visit | Attending: Obstetrics and Gynecology | Admitting: Obstetrics and Gynecology

## 2019-11-19 ENCOUNTER — Encounter: Payer: Self-pay | Admitting: Obstetrics and Gynecology

## 2019-11-19 ENCOUNTER — Ambulatory Visit (INDEPENDENT_AMBULATORY_CARE_PROVIDER_SITE_OTHER): Payer: Medicare Other | Admitting: Obstetrics and Gynecology

## 2019-11-19 ENCOUNTER — Other Ambulatory Visit: Payer: Self-pay

## 2019-11-19 VITALS — BP 120/80 | Ht 64.0 in | Wt 160.0 lb

## 2019-11-19 DIAGNOSIS — Z79890 Hormone replacement therapy (postmenopausal): Secondary | ICD-10-CM | POA: Diagnosis not present

## 2019-11-19 DIAGNOSIS — Z124 Encounter for screening for malignant neoplasm of cervix: Secondary | ICD-10-CM | POA: Insufficient documentation

## 2019-11-19 DIAGNOSIS — R87620 Atypical squamous cells of undetermined significance on cytologic smear of vagina (ASC-US): Secondary | ICD-10-CM

## 2019-11-19 DIAGNOSIS — Z1231 Encounter for screening mammogram for malignant neoplasm of breast: Secondary | ICD-10-CM

## 2019-11-19 DIAGNOSIS — N951 Menopausal and female climacteric states: Secondary | ICD-10-CM

## 2019-11-19 NOTE — Patient Instructions (Signed)
I value your feedback and entrusting us with your care. If you get a [facility] patient survey, I would appreciate you taking the time to let us know about your experience today. Thank you!  As of April 19, 2019, your lab results will be released to your MyChart immediately, before I even have a chance to see them. Please give me time to review them and contact you if there are any abnormalities. Thank you for your patience.  

## 2019-11-20 ENCOUNTER — Ambulatory Visit: Payer: Medicare Other | Admitting: Physical Therapy

## 2019-11-20 DIAGNOSIS — M62838 Other muscle spasm: Secondary | ICD-10-CM

## 2019-11-20 DIAGNOSIS — G8929 Other chronic pain: Secondary | ICD-10-CM

## 2019-11-20 DIAGNOSIS — R293 Abnormal posture: Secondary | ICD-10-CM | POA: Diagnosis not present

## 2019-11-20 DIAGNOSIS — M25511 Pain in right shoulder: Secondary | ICD-10-CM | POA: Diagnosis not present

## 2019-11-20 DIAGNOSIS — M4125 Other idiopathic scoliosis, thoracolumbar region: Secondary | ICD-10-CM

## 2019-11-20 LAB — CYTOLOGY - PAP: Diagnosis: NEGATIVE

## 2019-11-20 NOTE — Therapy (Signed)
[place] MAIN [facility] SERVICES [address] [place], [place], [address] Phone: [phone]   Fax:  [phone]  Physical Therapy Treatment /Progress Note reporting 09/14/19 to 11/20/19  Across 15 visits   Patient Details  Name: Julia Deleon MRN: [id number] Date of Birth: May 17, 1945 No data recorded  Encounter Date: 11/20/2019   PT End of Session - 11/20/19 1346    Visit Number 97    Date for PT Re-Evaluation 01/29/20   Progress note to report  (last on #97  11/20/19 )   Authorization Type Medicare    PT Start Time 1340    PT Stop Time 1435    PT Time Calculation (min) 55 min    Activity Tolerance Patient tolerated treatment well;No increased pain    Behavior During Therapy WFL for tasks assessed/performed           Past Medical History:  Diagnosis Date  . Arthritis   . COPD (chronic obstructive pulmonary disease) ([place])   . Family history of adverse reaction to anesthesia    father was slow to wake up  . Fuch's endothelial dystrophy   . Hyperlipidemia   . Hypothyroidism   . PONV (postoperative nausea and vomiting)    slow to wake up and PONV    Past Surgical History:  Procedure Laterality Date  . APPENDECTOMY    . BACK SURGERY  09/26/2018   L4-5 PLIF by Dr. Arnoldo Morale  . CARDIAC CATHETERIZATION    . CHOLECYSTECTOMY    . EYE SURGERY    . FOOT SURGERY Right    pin removed left  . laser vein surgery    . NASAL SINUS SURGERY    . RIGHT/LEFT HEART CATH AND CORONARY ANGIOGRAPHY N/A 12/06/2017   Procedure: RIGHT/LEFT HEART CATH AND CORONARY ANGIOGRAPHY;  Surgeon: Yolonda Kida, MD;  Location: [place] [facility];  Service: Cardiovascular;  Laterality: N/A;  . TONSILLECTOMY      There were no vitals filed for this visit.   Subjective Assessment - 11/20/19 1344    Subjective Pt reported she took hiking poles to the beach. Pt noticed she was able to walk down from the beach house tot he sand without them but needed them coming back up  because there was a slope              [facility] PT Assessment - 11/21/19 0852      Posture/Postural Control   Posture Comments earlobe to wall: 10.5 cm      Ambulation/Gait   Gait Comments longer strides, more throacic extension                      Pelvic Floor Special Questions - 11/21/19 0854    Pelvic Floor Internal Exam pt consented without contraindications     Exam Type Vaginal    Palpation less tightness     Strength fair squeeze, definite lift   proper sequential             [facility] Adult PT Treatment/Exercise - 11/21/19 0852      Ambulation/Gait   Ambulation Distance (Feet) 539.5 Feet   3:39 minutes, 2nd lap incomplete due to fire alarm    Assistive device --   hiking poles      Manual Therapy   Internal Pelvic Floor STM/MWM faciliation of pelvic floor                     PT Short  Term Goals - 06/08/19 1133      PT SHORT TERM GOAL #1   Title Patient will demonstrate improved pelvic alignment and balance of musculature surrounding the pelvis to facilitate decreased PFM spasms and decrease pelvic pain.    Baseline Pt. demonstrates severe posterior pelvic tilt, L thoracic/R lumbar scoliosis, and R anterior/L posterior pelvic obliquity. Pt. has demonstrated improved pelvic alignment and posture but has not been able to sustain long-term due to continued lack of postural strength.    Time 5    Period Weeks    Status Achieved    Target Date 03/06/19      PT SHORT TERM GOAL #2   Title Patient will demonstrate a coordinated contraction, relaxation, and bulge of the pelvic floor muscles to demonstrate functional recruitment and motion and allow for bladder emptying.    Baseline Pt. is unable to fully empty bladder and describes pain with catheterization, demonstrating high tone of PFM.    Time 5    Period Weeks    Status Achieved    Target Date 03/06/19      PT SHORT TERM GOAL #3   Title Patient will demonstrate improved sitting and standing  posture to demonstrate learning and decrease stress on the pelvic floor with functional activity.    Baseline Pt. demonstrates severe posterior pelvic tilt, L thoracic/R lumbar scoliosis, and R anterior/L posterior pelvic obliquity. 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 Will be able to cath without pain and will be cleared by physiscian to d/c self-cath due to decreased residual volume with bladder emptying.    Baseline required to cath 3 times per day, painful each time.    Time 10    Period Weeks    Status Achieved      PT LONG TERM GOAL #4   Title Patient will score less than or equal to 40% on the Female NIH-CPSI and 30% on the [facility] to demonstrate a reduction in pain, urinary symptoms, and an improved quality of life.    Baseline Female NIH-CPSI: 38/43 (88%) , PDI: 45/75 (60%); PDI 44/75 (59%) and Female NIH-CPSI: 27/43 (64%) on 02-08-19 , Female NIH-CPSI: 17% on  03-26-19    Time 10    Period Weeks    Status Achieved      PT LONG TERM GOAL #5   Title Patient will demonstrate  increased step length with reciprical arm swing, and demonstrate no scissoring steps.    Baseline Pt currently demonstrated scissoring steppage on R LE, increased trunk rotation and deep core engagement with reciprical walking poles.    Time 10    Period Weeks    Status Achieved      PT LONG TERM GOAL #6   Title Pt will increase her distance on the 6MWT from 747 feet  to  > 900 feet  order to progress with aerobic exercises and walking longer distances in the community  ( 5/10: 1000 ft)     Time 8    Period Weeks    Status Achieved      PT LONG TERM GOAL #7   Title Pt will report waking up > 2 mornings in a row with pain level < 5-6/10 in order to demo maintanence of Tx benefits and more balance of mm tensions achieved with HEP, shoe lift to improve QOL    Baseline 7-8/10 level and benefits from Tx lasted only one morning  (03-19-19 and 03-23-19)    Time 10    Period Weeks    Status Achieved      PT LONG TERM GOAL #8   Title Pt will be able to demo increased PF strength with unilateral UE support on wall from B 3/5 15 reps in order to achieve stronger push off in gait and walk longer distance    Baseline B, L 7 reps  2/5, 10 reps R 3/5  ( 12/17: 15 reps R , 13 reps L, 06/01/19: R 20 reps, L 18 reps  )    Time 10    Period Weeks    Status Achieved      PT LONG TERM GOAL  #9   TITLE Pt will demo shoulder abduction > 120 deg B with improved alignment of thorax over pelvis without manual cues in order to demo proper co-activation of B equal weight bearing in BLE, and decreased thoracic kyhposis/ decreased thoracic rotation 2/2 scoliosis in order to lift plates overhead cabinets/ decreased risk for    Time 10    Period Weeks    Status Achieved      PT LONG TERM GOAL  #10   TITLE Pt will demo increased height from 63" to > 63 1/2 " and maintain 63 1/2" across 3 weeks ( once a week measurement) and forward head posture improve ( decreased distance from wall to earlobe 17 cm from earlobe to  wall to > 12 cm)  in order to be ready to progress to PNF exercise with less shoulder pain and lengthen/ strengthen concave curve on R 2/2 scoliosis to minimize L lateral thigh tightness  (07/06/19 : 63 3/4",  08/10/19: 64"   )    Time 8    Period Weeks    Status Achieved      PT LONG TERM GOAL  #11   TITLE Pt will demo single UE support with lunge position exercises/ yoga poses  in order to demo improved balance and IND with flexibility routine    Baseline BUE support    Time 10    Period Weeks    Status On-going      PT LONG TERM GOAL  #12   TITLE Pt will demo L  trunk rotation in R sidelying with zero report of pain in order to progress with longer walking endurance    Time 10    Period Weeks    Status Achieved      PT LONG TERM GOAL  #13   TITLE Pt will demo increased  shoulder abd R against wall in mini squat from 55 deg to > 75 deg in order to demo increased thoracic extension and intercostal lengthening for functional BUE mobility without compensations.  ( 4/2: 88 deg R )    Time 8    Period Weeks    Status Achieved      PT LONG TERM GOAL  #14   TITLE Pt will demo less forward head posture ( earlobe to wall <13  cm) nd maintain height of 64  1/4" for 1 month in order to stand taller w/ less kyphosis and less deviations to spine to walk longer distances ( 11/20/19 : 10.5 cm)      Baseline ( earlobe to wall 16 cm)  ( 64  1/14" on 09/14/19  )     Time 10    Period Weeks    Status Achieved      PT LONG TERM GOAL  #15   TITLE Pt will demo proper coordination of pelvic floor and contraction 3sec, 3 rep in order to minimzie leakage.     Time 10    Status New                 Plan - 11/21/19 0850    Clinical Impression Statement Pt has achieved 13/15 goals.  Pt's postural has made significant improvement with significantly decreased scoliotic curves and thoracic kyphosis. Her milestone improvement over the past 15 visits has been ambulating with hiking poles which have helped  increase her endurance and promote less scoliosis related gait deviations.She has doubled her distance across 6 minutes with less report of L lateral thigh to knee pain. Pt has been IND with seated stretches that has helped increase her stride length and hip mobility. Pt is progressing well towards remaining goals which are focused on pelvic floor coordination / strengthening and balance.  Pt continues to benefit from skilled PT.    Personal Factors and Comorbidities Age;Comorbidity 3+    Comorbidities Osteoporosis, scoliosis, Arthritis, COPD, recent lumbosacral fusion for spondylolisthesis.    Examination-Activity Limitations Toileting;Sit;Stand;Bend;Lift;Carry    Examination-Participation Restrictions Interpersonal Relationship;Yard Work;Cleaning;Laundry    Stability/Clinical Decision Making Unstable/Unpredictable    Rehab Potential Good    PT Frequency 2x / week    PT Duration Other (comment)   20   PT Treatment/Interventions ADLs/Self Care Home Management;Aquatic Therapy;Moist Heat;Electrical Stimulation;Traction;Therapeutic activities;Functional mobility training;Stair training;Gait training;Therapeutic exercise;Balance training;Neuromuscular re-education;Patient/family education;Manual techniques;Dry needling;Passive range of motion;Scar mobilization;Taping    Consulted and Agree with Plan of Care Patient           Patient will benefit from skilled therapeutic intervention in order to improve the following deficits and impairments:  Abnormal gait, Decreased balance, Increased muscle spasms, Decreased range of motion, Decreased scar mobility, Improper body mechanics, Decreased coordination, Decreased strength, Increased fascial restricitons, Impaired flexibility, Postural dysfunction, Pain  Visit Diagnosis: Other idiopathic scoliosis, thoracolumbar region  Chronic right shoulder pain  Other muscle spasm  Abnormal posture     Problem List Patient Active Problem List   Diagnosis  Date Noted  . Family history of breast cancer 11/16/2019  . Chronic UTI 12/06/2018  . Status post lumbar surgery 12/06/2018  . Spondylolisthesis, lumbar region  09/26/2018  . Aortic atherosclerosis ([place]) 08/31/2018  . CAD (coronary artery disease) 02/23/2018  . Advanced care planning/counseling discussion 03/28/2017  . Varicose veins of both lower extremities with complications 61/47/0929  . Arthritis 12/09/2016  . Vaginal atrophy 09/10/2015  . Vasomotor symptoms due to menopause 09/10/2015  . Onychomycosis due to dermatophyte 04/07/2015  . Bursitis of right shoulder 02/26/2015  . Environmental and seasonal allergies 02/26/2015  . Other allergic rhinitis 02/26/2015  . Osteoporosis 11/26/2014  . COPD (chronic obstructive pulmonary disease) ([place]) 11/26/2014  . Fuchs' corneal dystrophy 11/26/2014  . Benign hypertensive renal disease 11/26/2014  . Hyperlipidemia 11/26/2014  . Chronic kidney disease, stage III (moderate) ([place]) 11/26/2014  . Hypothyroidism 11/26/2014  . Endothelial corneal dystrophy 11/26/2014    Jerl Mina ,PT, DPT, E-RYT  11/21/2019, 8:57 AM  [place] MAIN [facility] SERVICES [address] [place], [place], [address] Phone: [phone]   Fax:  [phone]  Name: Julia Deleon MRN: [id number] Date of Birth: 1945/12/21

## 2019-11-22 ENCOUNTER — Other Ambulatory Visit: Payer: Self-pay

## 2019-11-22 ENCOUNTER — Ambulatory Visit: Payer: Medicare Other | Admitting: Physical Therapy

## 2019-11-22 DIAGNOSIS — R293 Abnormal posture: Secondary | ICD-10-CM | POA: Diagnosis not present

## 2019-11-22 DIAGNOSIS — M25511 Pain in right shoulder: Secondary | ICD-10-CM

## 2019-11-22 DIAGNOSIS — M62838 Other muscle spasm: Secondary | ICD-10-CM

## 2019-11-22 DIAGNOSIS — G8929 Other chronic pain: Secondary | ICD-10-CM | POA: Diagnosis not present

## 2019-11-22 DIAGNOSIS — M4125 Other idiopathic scoliosis, thoracolumbar region: Secondary | ICD-10-CM | POA: Diagnosis not present

## 2019-11-22 NOTE — Therapy (Addendum)
[place] MAIN [facility] SERVICES [place], [place], [address] Phone: [phone]   Fax:  [phone]  Physical Therapy Treatment  Patient Details  Name: Julia Deleon MRN: [id number] Date of Birth: [birth year] No data recorded  Encounter Date: 11/22/2019   PT End of Session - 11/22/19 1135    Visit Number [id number]    Date for PT Re-Evaluation 01/29/20   Progress note to report  (last on #97  11/20/19 )   Authorization Type Medicare    PT Start Time 1103    PT Stop Time 1200    PT Time Calculation (min) 57 min    Activity Tolerance Patient tolerated treatment well;No increased pain    Behavior During Therapy WFL for tasks assessed/performed           Past Medical History:  Diagnosis Date  . Arthritis   . COPD (chronic obstructive pulmonary disease) ([place])   . Family history of adverse reaction to anesthesia    father was slow to wake up  . Fuch's endothelial dystrophy   . Hyperlipidemia   . Hypothyroidism   . PONV (postoperative nausea and vomiting)    slow to wake up and PONV    Past Surgical History:  Procedure Laterality Date  . APPENDECTOMY    . BACK SURGERY  09/26/2018   L4-5 PLIF by Dr. Arnoldo Morale  . CARDIAC CATHETERIZATION    . CHOLECYSTECTOMY    . EYE SURGERY    . FOOT SURGERY Right    pin removed left  . laser vein surgery    . NASAL SINUS SURGERY    . RIGHT/LEFT HEART CATH AND CORONARY ANGIOGRAPHY N/A 12/06/2017   Procedure: RIGHT/LEFT HEART CATH AND CORONARY ANGIOGRAPHY;  Surgeon: Yolonda Kida, MD;  Location: [place] [facility];  Service: Cardiovascular;  Laterality: N/A;  . TONSILLECTOMY      There were no vitals filed for this visit.   Subjective Assessment - 11/22/19 1109    Subjective Pt reported she took hiking poles to the beach. Pt noticed she was able to walk down from the beach house tot he sand without them but needed them coming back up because there was a slope                           Pelvic Floor Special Questions - 11/22/19 1136    Pelvic Floor Internal Exam pt consented without contraindications     Exam Type Vaginal    Palpation less tightness at R ATLA, scar restriction at 5 oclock 1-2nd layers and ischial rami      Strength fair squeeze, definite lift   proper sequential             [facility] Adult PT Treatment/Exercise - 11/22/19 1136      Ambulation/Gait   Ambulation Distance (Feet) 539.5 Feet   3:37min   Assistive device --   hiking pole     Neuro Re-ed    Neuro Re-ed Details  cued for HEP stretches       Moist Heat Therapy   Number Minutes Moist Heat 5 Minutes    Moist Heat Location --   L perineum      Manual Therapy   Internal Pelvic Floor STM/MWM to release scar at 5 clock 1-2nd layers and rami                     PT Short Term  Goals - 06/08/19 1133      PT SHORT TERM GOAL #1   Title Patient will demonstrate improved pelvic alignment and balance of musculature surrounding the pelvis to facilitate decreased PFM spasms and decrease pelvic pain.    Baseline Pt. demonstrates severe posterior pelvic tilt, L thoracic/R lumbar scoliosis, and R anterior/L posterior pelvic obliquity. Pt. has demonstrated improved pelvic alignment and posture but has not been able to sustain long-term due to continued lack of postural strength.    Time 5    Period Weeks    Status Achieved    Target Date 03/06/19      PT SHORT TERM GOAL #2   Title Patient will demonstrate a coordinated contraction, relaxation, and bulge of the pelvic floor muscles to demonstrate functional recruitment and motion and allow for bladder emptying.    Baseline Pt. is unable to fully empty bladder and describes pain with catheterization, demonstrating high tone of PFM.    Time 5    Period Weeks    Status Achieved    Target Date 03/06/19      PT SHORT TERM GOAL #3   Title Patient will demonstrate improved sitting and standing posture to demonstrate  learning and decrease stress on the pelvic floor with functional activity.    Baseline Pt. demonstrates severe posterior pelvic tilt, L thoracic/R lumbar scoliosis, and R anterior/L posterior pelvic obliquity. 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 Will be able to cath without pain and will be cleared by physiscian to d/c self-cath due to decreased residual volume with bladder emptying.    Baseline required to cath 3 times per day, painful each time.    Time 10    Period Weeks    Status Achieved      PT LONG TERM GOAL #4   Title Patient will score less than or equal to 40% on the Female NIH-CPSI and 30% on the [facility] to demonstrate a reduction in pain, urinary symptoms, and an improved quality of life.    Baseline Female NIH-CPSI: 38/43 (88%) , PDI: 45/75 (60%); PDI 44/75 (59%) and Female NIH-CPSI: 27/43 (64%) on 02-08-19 , Female NIH-CPSI: 17% on 03-26-19  Time 10    Period Weeks    Status Achieved      PT LONG TERM GOAL #5   Title Patient will demonstrate increased step length  with reciprical arm swing, and demonstrate no scissoring steps.    Baseline Pt currently demonstrated scissoring steppage on R LE, increased trunk rotation and deep core engagement with reciprical walking poles.    Time 10    Period Weeks    Status Achieved      PT LONG TERM GOAL #6   Title Pt will increase her distance on the 6MWT from 747 feet  to  > 900 feet  order to progress with aerobic exercises and walking longer distances in the community  ( 5/10: 1000 ft)     Time 8    Period Weeks    Status Achieved      PT LONG TERM GOAL #7   Title Pt will report waking up > 2 mornings in a row with pain level < 5-6/10 in order to demo maintanence of Tx benefits and more balance of mm tensions achieved with HEP, shoe lift to improve QOL    Baseline 7-8/10 level and benefits from Tx lasted only one morning  (03-19-19 and 03-23-19)    Time 10    Period Weeks    Status Achieved      PT LONG TERM GOAL #8   Title Pt will be able to demo increased PF strength with unilateral UE support on wall from B 3/5 15 reps in order to achieve stronger push off in gait and walk longer distance    Baseline B, L 7 reps  2/5, 10 reps R 3/5  ( 12/17: 15 reps R , 13 reps L, 06/01/19: R 20 reps, L 18 reps  )    Time 10    Period Weeks    Status Achieved      PT LONG TERM GOAL  #9   TITLE Pt will demo shoulder abduction > 120 deg B with improved alignment of thorax over pelvis without manual cues in order to demo proper co-activation of B equal weight bearing in BLE, and decreased thoracic kyhposis/ decreased thoracic rotation 2/2 scoliosis in order to lift plates overhead cabinets/ decreased risk for    Time 10    Period Weeks    Status Achieved      PT LONG TERM GOAL  #10   TITLE Pt will demo increased height from 63" to > 63 1/2 " and maintain 63 1/2" across 3 weeks ( once a week measurement) and forward head posture improve ( decreased distance from wall to earlobe 17 cm from earlobe to wall to > 12 cm)  in  order to be ready to progress to PNF exercise with less shoulder pain and lengthen/ strengthen concave curve on R 2/2 scoliosis to minimize L lateral thigh tightness  (07/06/19 : 63 3/4",  08/10/19: 64"   )    Time 8    Period Weeks    Status Achieved      PT LONG TERM GOAL  #11   TITLE Pt will demo single UE support with lunge position exercises/ yoga poses  in order to demo improved balance and IND with flexibility routine    Baseline BUE support    Time 10    Period Weeks    Status On-going      PT LONG TERM GOAL  #12   TITLE Pt will demo L trunk rotation in R  sidelying with zero report of pain in order to progress with longer walking endurance    Time 10    Period Weeks    Status Achieved      PT LONG TERM GOAL  #13   TITLE Pt will demo increased  shoulder abd R against wall in mini squat from 55 deg to > 75 deg in order to demo increased thoracic extension and intercostal lengthening for functional BUE mobility without compensations.  ( 4/2: 88 deg R )    Time 8    Period Weeks    Status Achieved      PT LONG TERM GOAL  #14   TITLE Pt will demo less forward head posture ( earlobe to wall <13  cm) nd maintain height of 64  1/4" for 1 month in order to stand taller w/ less kyphosis and less deviations to spine to walk longer distances ( 11/20/19 : 10.5 cm)      Baseline ( earlobe to wall 16 cm)  ( 64  1/14" on 09/14/19  )     Time 10    Period Weeks    Status Achieved      PT LONG TERM GOAL  #15   TITLE Pt will demo proper coordination of pelvic floor and contraction 3sec, 3 rep in order to minimzie leakage.     Time 10    Status New                 Plan - 11/22/19 1135    Clinical Impression Statement Pt still requires reminders to position and alignment in pre and post walking stretches. Pt demo'd increased L  perineal scar restrictions which decreased today. This may be linked to her L lateral thigh thigh pain. Pt continues to increase her walking speed and gait pattern  with hiking poles.  Pt continues to benefit from skilled PT.    Personal Factors and Comorbidities Age;Comorbidity 3+    Comorbidities Osteoporosis, scoliosis, Arthritis, COPD, recent lumbosacral fusion for spondylolisthesis.    Examination-Activity Limitations Toileting;Sit;Stand;Bend;Lift;Carry    Examination-Participation Restrictions Interpersonal Relationship;Yard Work;Cleaning;Laundry    Stability/Clinical Decision Making Unstable/Unpredictable    Rehab Potential Good    PT Frequency 2x / week    PT Duration Other (comment)   20   PT Treatment/Interventions ADLs/Self Care Home Management;Aquatic Therapy;Moist Heat;Electrical Stimulation;Traction;Therapeutic activities;Functional mobility training;Stair training;Gait training;Therapeutic exercise;Balance training;Neuromuscular re-education;Patient/family education;Manual techniques;Dry needling;Passive range of motion;Scar mobilization;Taping    Consulted and Agree with Plan of Care Patient           Patient will benefit from skilled therapeutic intervention in order to improve the following deficits and impairments:  Abnormal gait, Decreased balance, Increased muscle spasms, Decreased range of motion, Decreased scar mobility, Improper body mechanics, Decreased coordination, Decreased strength, Increased fascial restricitons, Impaired flexibility, Postural dysfunction, Pain  Visit Diagnosis: Other idiopathic scoliosis, thoracolumbar region  Chronic right shoulder pain  Other muscle spasm  Abnormal posture     Problem List Patient Active Problem List   Diagnosis Date Noted  . Family history of breast cancer 11/16/2019  . Chronic UTI 12/06/2018  . Status post lumbar surgery 12/06/2018  . Spondylolisthesis, lumbar region 09/26/2018  . Aortic atherosclerosis ([place]) 08/31/2018  . CAD (coronary artery disease) 02/23/2018  . Advanced care planning/counseling discussion 03/28/2017  . Varicose veins of both lower extremities with  complications 43/32/9518  . Arthritis 12/09/2016  . Vaginal atrophy 09/10/2015  . Vasomotor symptoms due to menopause 09/10/2015  . Onychomycosis due to  dermatophyte 04/07/2015  . Bursitis of right shoulder 02/26/2015  . Environmental and seasonal allergies 02/26/2015  . Other allergic rhinitis 02/26/2015  . Osteoporosis 11/26/2014  . COPD (chronic obstructive pulmonary disease) ([place]) 11/26/2014  . Fuchs' corneal dystrophy 11/26/2014  . Benign hypertensive renal disease 11/26/2014  . Hyperlipidemia 11/26/2014  . Chronic kidney disease, stage III (moderate) ([place]) 11/26/2014  . Hypothyroidism 11/26/2014  . Endothelial corneal dystrophy 11/26/2014    Jerl Mina 11/22/2019, 2:57 PM  [place] MAIN [facility] SERVICES [address] [place], [place], [address] Phone: [phone]   Fax:  [phone]  Name: Millianna C Olivarez MRN: [id number] Date of Birth: 1946-02-01

## 2019-11-22 NOTE — Patient Instructions (Signed)
R knee circles, holding thigh,  R knee bent, slides   To stretch perineal scar onL

## 2019-11-23 ENCOUNTER — Encounter: Payer: Medicare Other | Admitting: Physical Therapy

## 2019-11-26 DIAGNOSIS — I10 Essential (primary) hypertension: Secondary | ICD-10-CM | POA: Diagnosis not present

## 2019-11-26 DIAGNOSIS — E785 Hyperlipidemia, unspecified: Secondary | ICD-10-CM | POA: Diagnosis not present

## 2019-11-26 DIAGNOSIS — N1831 Chronic kidney disease, stage 3a: Secondary | ICD-10-CM | POA: Diagnosis not present

## 2019-11-26 DIAGNOSIS — R600 Localized edema: Secondary | ICD-10-CM | POA: Diagnosis not present

## 2019-11-26 DIAGNOSIS — R338 Other retention of urine: Secondary | ICD-10-CM | POA: Diagnosis not present

## 2019-11-27 ENCOUNTER — Ambulatory Visit: Payer: Medicare Other | Admitting: Physical Therapy

## 2019-11-27 ENCOUNTER — Other Ambulatory Visit: Payer: Self-pay

## 2019-11-27 DIAGNOSIS — M25511 Pain in right shoulder: Secondary | ICD-10-CM | POA: Diagnosis not present

## 2019-11-27 DIAGNOSIS — M62838 Other muscle spasm: Secondary | ICD-10-CM | POA: Diagnosis not present

## 2019-11-27 DIAGNOSIS — G8929 Other chronic pain: Secondary | ICD-10-CM | POA: Diagnosis not present

## 2019-11-27 DIAGNOSIS — R293 Abnormal posture: Secondary | ICD-10-CM

## 2019-11-27 DIAGNOSIS — M4125 Other idiopathic scoliosis, thoracolumbar region: Secondary | ICD-10-CM | POA: Diagnosis not present

## 2019-11-27 NOTE — Patient Instructions (Signed)
    Frog stretch: laying on belly folded sheet  under hips, knees bent, inhale do nothing, exhale let ankles fall apart   10 reps

## 2019-11-27 NOTE — Therapy (Signed)
[place] MAIN [facility] SERVICES [address] [place], [place], [address] Phone: [phone]   Fax:  [phone]  Physical Therapy Treatment  Patient Details  Name: Julia Deleon MRN: [id number] Date of Birth: March 13, 1946 No data recorded  Encounter Date: 11/27/2019   PT End of Session - 11/27/19 1048    Visit Number 61    Date for PT Re-Evaluation 01/29/20   Progress note to report  (last on #97  11/20/19 )   Authorization Type Medicare    PT Start Time 0907    PT Stop Time 1000    PT Time Calculation (min) 53 min    Activity Tolerance Patient tolerated treatment well;No increased pain    Behavior During Therapy WFL for tasks assessed/performed           Past Medical History:  Diagnosis Date  . Arthritis   . COPD (chronic obstructive pulmonary disease) ([place])   . Family history of adverse reaction to anesthesia    father was slow to wake up  . Fuch's endothelial dystrophy   . Hyperlipidemia   . Hypothyroidism   . PONV (postoperative nausea and vomiting)    slow to wake up and PONV    Past Surgical History:  Procedure Laterality Date  . APPENDECTOMY    . BACK SURGERY  09/26/2018   L4-5 PLIF by Dr. Arnoldo Morale  . CARDIAC CATHETERIZATION    . CHOLECYSTECTOMY    . EYE SURGERY    . FOOT SURGERY Right    pin removed left  . laser vein surgery    . NASAL SINUS SURGERY    . RIGHT/LEFT HEART CATH AND CORONARY ANGIOGRAPHY N/A 12/06/2017   Procedure: RIGHT/LEFT HEART CATH AND CORONARY ANGIOGRAPHY;  Surgeon: Yolonda Kida, MD;  Location: [facility];  Service: Cardiovascular;  Laterality: N/A;  . TONSILLECTOMY      There were no vitals filed for this visit.   Subjective Assessment - 11/27/19 0914    Subjective Pt reported she took a nap yesterday after feeling affected by the high pressure system coming through. Pt slept for 30-45 min and felt better. Pt went on a 9 hour drive and managed with her leg pain.                           Pelvic Floor Special Questions - 11/27/19 0949    Pelvic Floor Internal Exam pt consented without contraindications     Exam Type Vaginal    Sensation palpation at 5 o'clock position referred pain to L leg, radiating to ankle     Palpation 1-2 layers at 5 and 7 o' clock      Strength fair squeeze, definite lift   improved posterior mm lengthening             [facility] Adult PT Treatment/Exercise - 11/27/19 0955      Neuro Re-ed    Neuro Re-ed Details  cued for frog stretch to stretch posterior pelvic floor with folded sheet under groin to promote hip ext       Exercises   Other Exercises  see pt instructions       Moist Heat Therapy   Number Minutes Moist Heat 5 Minutes    Moist Heat Location --   sacrum/ perineum     Manual Therapy   Internal Pelvic Floor STM/MWM to release scar at 5 clock 1-2nd layers  PT Short Term Goals - 06/08/19 1133      PT SHORT TERM GOAL #1   Title Patient will demonstrate improved pelvic alignment and balance of musculature surrounding the pelvis to facilitate decreased PFM spasms and decrease pelvic pain.    Baseline Pt. demonstrates severe posterior pelvic tilt, L thoracic/R lumbar scoliosis, and R anterior/L posterior pelvic obliquity. Pt. has demonstrated improved pelvic alignment and posture but has not been able to sustain long-term due to continued lack of postural strength.    Time 5    Period Weeks    Status Achieved    Target Date 03/06/19      PT SHORT TERM GOAL #2   Title Patient will demonstrate a coordinated contraction, relaxation, and bulge of the pelvic floor muscles to demonstrate functional recruitment and motion and allow for bladder emptying.    Baseline Pt. is unable to fully empty bladder and describes pain with catheterization, demonstrating high tone of PFM.    Time 5    Period Weeks    Status Achieved    Target Date 03/06/19      PT SHORT TERM GOAL #3   Title  Patient will demonstrate improved sitting and standing posture to demonstrate learning and decrease stress on the pelvic floor with functional activity.    Baseline Pt. demonstrates severe posterior pelvic tilt, L thoracic/R lumbar scoliosis, and R anterior/L posterior pelvic obliquity. 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 Will be able to cath without pain and will be cleared by physiscian to d/c self-cath due to decreased residual volume with bladder emptying.    Baseline required to cath 3 times per day, painful each time.    Time 10    Period Weeks    Status Achieved      PT LONG TERM GOAL #4   Title Patient will score less than or equal to 40% on the Female NIH-CPSI and 30% on the [facility] to demonstrate a reduction in pain, urinary symptoms, and an improved quality of life.    Baseline Female NIH-CPSI: 38/43 (88%) , PDI: 45/75 (60%); PDI 44/75 (59%) and Female NIH-CPSI: 27/43 (64%) on 02-08-19 , Female NIH-CPSI: 17%  on 03-26-19    Time 10    Period Weeks    Status Achieved       PT LONG TERM GOAL #5   Title Patient will demonstrate increased step length with reciprical arm swing, and demonstrate no scissoring steps.    Baseline Pt currently demonstrated scissoring steppage on R LE, increased trunk rotation and deep core engagement with reciprical walking poles.    Time 10    Period Weeks    Status Achieved      PT LONG TERM GOAL #6   Title Pt will increase her distance on the 6MWT from 747 feet  to  > 900 feet  order to progress with aerobic exercises and walking longer distances in the community  ( 5/10: 1000 ft)     Time 8    Period Weeks    Status Achieved      PT LONG TERM GOAL #7   Title Pt will report waking up > 2 mornings in a row with pain level < 5-6/10 in order to demo maintanence of Tx benefits and more balance of mm tensions achieved with HEP, shoe lift to improve QOL    Baseline 7-8/10 level and benefits from Tx lasted only one morning  (03-19-19 and 03-23-19)    Time 10    Period Weeks    Status Achieved      PT LONG TERM GOAL #8   Title Pt will be able to demo increased PF strength with unilateral UE support on wall from B 3/5 15 reps in order to achieve stronger push off in gait and walk longer distance    Baseline B, L 7 reps  2/5, 10 reps R 3/5  ( 12/17: 15 reps R , 13 reps L, 06/01/19: R 20 reps, L 18 reps  )    Time 10    Period Weeks    Status Achieved      PT LONG TERM GOAL  #9   TITLE Pt will demo shoulder abduction > 120 deg B with improved alignment of thorax over pelvis without manual cues in order to demo proper co-activation of B equal weight bearing in BLE, and decreased thoracic kyhposis/ decreased thoracic rotation 2/2 scoliosis in order to lift plates overhead cabinets/ decreased risk for    Time 10    Period Weeks    Status Achieved      PT LONG TERM GOAL  #10   TITLE Pt will demo increased height from 63" to > 63 1/2 " and maintain 63 1/2" across 3 weeks ( once a week measurement) and forward head posture improve ( decreased  distance from wall to earlobe 17 cm from earlobe to wall to > 12 cm)  in order to be ready to progress to PNF exercise with less shoulder pain and lengthen/ strengthen concave curve on R 2/2 scoliosis to minimize L lateral thigh tightness  (07/06/19 : 63 3/4",  08/10/19: 64"   )    Time 8    Period Weeks    Status Achieved      PT LONG TERM GOAL  #11   TITLE Pt will demo single UE support with lunge position exercises/ yoga poses  in order to demo improved balance and IND with flexibility routine    Baseline BUE support    Time 10    Period Weeks    Status On-going      PT LONG TERM GOAL  #12   TITLE Pt will demo  L trunk rotation in R sidelying with zero report of pain in order to progress with longer walking endurance    Time 10    Period Weeks    Status Achieved      PT LONG TERM GOAL  #13   TITLE Pt will demo increased  shoulder abd R against wall in mini squat from 55 deg to > 75 deg in order to demo increased thoracic extension and intercostal lengthening for functional BUE mobility without compensations.  ( 4/2: 88 deg R )    Time 8    Period Weeks    Status Achieved      PT LONG TERM GOAL  #14   TITLE Pt will demo less forward head posture ( earlobe to wall <13  cm) nd maintain height of 64  1/4" for 1 month in order to stand taller w/ less kyphosis and less deviations to spine to walk longer distances ( 11/20/19 : 10.5 cm)      Baseline ( earlobe to wall 16 cm)  ( 64  1/14" on 09/14/19  )     Time 10    Period Weeks    Status Achieved      PT LONG TERM GOAL  #15   TITLE Pt will demo proper coordination of pelvic floor and contraction 3sec, 3 rep in order to minimzie leakage.     Time 10    Status New                 Plan - 11/27/19 1049    Clinical Impression Statement Pt demo'd self-management of pain with report of taking a nap when the barometric pressure affected her. This indicates better lifestyle changes for pain management.   Today, continued with internal  pelvic floor treatment to minimize restrictions. Pt demo'd improved posterior mm lengthening post Tx. Pt reported referred L leg pain with palpation at L posterior pelvic floor which decreased in tensions and radiating pain post Tx. Pt continues to benefit from skilled PT. Plan to continue to address pelvic floor which will help with hip extension and longer sride length for longer endurance with ambulation.    Personal Factors and Comorbidities Age;Comorbidity 3+    Comorbidities Osteoporosis, scoliosis, Arthritis, COPD, recent lumbosacral fusion for spondylolisthesis.    Examination-Activity Limitations Toileting;Sit;Stand;Bend;Lift;Carry    Examination-Participation Restrictions Interpersonal Relationship;Yard Work;Cleaning;Laundry    Stability/Clinical Decision Making Unstable/Unpredictable    Rehab Potential Good    PT Frequency 2x / week    PT Duration Other (comment)   20   PT Treatment/Interventions ADLs/Self Care Home Management;Aquatic Therapy;Moist Heat;Electrical Stimulation;Traction;Therapeutic activities;Functional mobility training;Stair training;Gait training;Therapeutic exercise;Balance training;Neuromuscular re-education;Patient/family education;Manual techniques;Dry needling;Passive range of motion;Scar mobilization;Taping    Consulted and Agree with Plan of Care Patient           Patient will benefit from skilled therapeutic intervention in order to improve the following deficits and impairments:  Abnormal gait, Decreased balance, Increased muscle spasms, Decreased range of motion, Decreased scar mobility, Improper body mechanics, Decreased coordination, Decreased strength, Increased fascial restricitons, Impaired flexibility, Postural dysfunction, Pain  Visit Diagnosis: Other idiopathic scoliosis, thoracolumbar region  Chronic right shoulder pain  Other muscle spasm  Abnormal posture     Problem List Patient Active Problem List   Diagnosis Date Noted  . Family  history of breast cancer 11/16/2019  . Chronic UTI 12/06/2018  . Status post lumbar surgery 12/06/2018  . Spondylolisthesis, lumbar region 09/26/2018  . Aortic atherosclerosis ([place]) 08/31/2018  . CAD (  coronary artery disease) 02/23/2018  . Advanced care planning/counseling discussion 03/28/2017  . Varicose veins of both lower extremities with complications 59/74/7185  . Arthritis 12/09/2016  . Vaginal atrophy 09/10/2015  . Vasomotor symptoms due to menopause 09/10/2015  . Onychomycosis due to dermatophyte 04/07/2015  . Bursitis of right shoulder 02/26/2015  . Environmental and seasonal allergies 02/26/2015  . Other allergic rhinitis 02/26/2015  . Osteoporosis 11/26/2014  . COPD (chronic obstructive pulmonary disease) ([place]) 11/26/2014  . Fuchs' corneal dystrophy 11/26/2014  . Benign hypertensive renal disease 11/26/2014  . Hyperlipidemia 11/26/2014  . Chronic kidney disease, stage III (moderate) ([place]) 11/26/2014  . Hypothyroidism 11/26/2014  . Endothelial corneal dystrophy 11/26/2014    Jerl Mina ,PT, DPT, E-RYT  11/27/2019, 10:51 AM  [place] MAIN [facility] SERVICES [address] [place], [place], [address] Phone: [phone]   Fax:  [phone]  Name: Julia Deleon MRN: [id number] Date of Birth: May 10, 1946

## 2019-11-29 ENCOUNTER — Other Ambulatory Visit: Payer: Self-pay

## 2019-11-29 ENCOUNTER — Ambulatory Visit: Payer: Medicare Other | Admitting: Physical Therapy

## 2019-11-29 DIAGNOSIS — M4125 Other idiopathic scoliosis, thoracolumbar region: Secondary | ICD-10-CM | POA: Diagnosis not present

## 2019-11-29 DIAGNOSIS — M25511 Pain in right shoulder: Secondary | ICD-10-CM | POA: Diagnosis not present

## 2019-11-29 DIAGNOSIS — G8929 Other chronic pain: Secondary | ICD-10-CM

## 2019-11-29 DIAGNOSIS — M62838 Other muscle spasm: Secondary | ICD-10-CM | POA: Diagnosis not present

## 2019-11-29 DIAGNOSIS — R293 Abnormal posture: Secondary | ICD-10-CM

## 2019-11-29 NOTE — Patient Instructions (Addendum)
Pelvic tilt with feet firm and no abdominal straining  ___  Butterfly pose for rest 5-8 min Pillows under knees

## 2019-11-29 NOTE — Therapy (Signed)
[place] MAIN [facility] SERVICES [address] [place], [place], [address] Phone: [phone]   Fax:  [phone]  Physical Therapy Treatment  Patient Details  Name: Julia Deleon MRN: [id number] Date of Birth: 1945/10/14 No data recorded  Encounter Date: 11/29/2019   PT End of Session - 11/29/19 0932    Visit Number 100    Date for PT Re-Evaluation 01/29/20   Progress note to report  (last on #97  11/20/19 )   Authorization Type Medicare    PT Start Time 0903    PT Stop Time 1000    PT Time Calculation (min) 57 min    Activity Tolerance Patient tolerated treatment well;No increased pain    Behavior During Therapy WFL for tasks assessed/performed           Past Medical History:  Diagnosis Date  . Arthritis   . COPD (chronic obstructive pulmonary disease) ([place])   . Family history of adverse reaction to anesthesia    father was slow to wake up  . Fuch's endothelial dystrophy   . Hyperlipidemia   . Hypothyroidism   . PONV (postoperative nausea and vomiting)    slow to wake up and PONV    Past Surgical History:  Procedure Laterality Date  . APPENDECTOMY    . BACK SURGERY  09/26/2018   L4-5 PLIF by Dr. Arnoldo Morale  . CARDIAC CATHETERIZATION    . CHOLECYSTECTOMY    . EYE SURGERY    . FOOT SURGERY Right    pin removed left  . laser vein surgery    . NASAL SINUS SURGERY    . RIGHT/LEFT HEART CATH AND CORONARY ANGIOGRAPHY N/A 12/06/2017   Procedure: RIGHT/LEFT HEART CATH AND CORONARY ANGIOGRAPHY;  Surgeon: Yolonda Kida, MD;  Location: [facility];  Service: Cardiovascular;  Laterality: N/A;  . TONSILLECTOMY      There were no vitals filed for this visit.   Subjective Assessment - 11/29/19 0933    Subjective Pt did not experience soreness after last session. Pt wakes up with less tightness on Lthigh/ knee more and more compared the past.              [facility] PT Assessment - 11/29/19 0937      Coordination   Fine Motor  Movements are Fluid and Coordinated --   pelvic tilt with ab straining      Ambulation/Gait   Ambulation Distance (Feet) 1079 Feet    Assistive device --   6:10                         [facility] Adult PT Treatment/Exercise - 11/29/19 0937      Therapeutic Activites    Other Therapeutic Activities gait training with hiking poles       Neuro Re-ed    Neuro Re-ed Details  cued for pelvic tilt w/o ab straining       Moist Heat Therapy   Number Minutes Moist Heat 5 Minutes    Moist Heat Location --   perineum      Manual Therapy   Internal Pelvic Floor STM/MWM to release scar at 5-7 o' clock 1-2nd layers                     PT Short Term Goals - 06/08/19 1133      PT SHORT TERM GOAL #1   Title Patient will demonstrate improved pelvic alignment and balance  of musculature surrounding the pelvis to facilitate decreased PFM spasms and decrease pelvic pain.    Baseline Pt. demonstrates severe posterior pelvic tilt, L thoracic/R lumbar scoliosis, and R anterior/L posterior pelvic obliquity. Pt. has demonstrated improved pelvic alignment and posture but has not been able to sustain long-term due to continued lack of postural strength.    Time 5    Period Weeks    Status Achieved    Target Date 03/06/19      PT SHORT TERM GOAL #2   Title Patient will demonstrate a coordinated contraction, relaxation, and bulge of the pelvic floor muscles to demonstrate functional recruitment and motion and allow for bladder emptying.    Baseline Pt. is unable to fully empty bladder and describes pain with catheterization, demonstrating high tone of PFM.    Time 5    Period Weeks    Status Achieved    Target Date 03/06/19      PT SHORT TERM GOAL #3   Title Patient will demonstrate improved sitting and standing posture to demonstrate learning and decrease stress on the pelvic floor with functional activity.    Baseline Pt. demonstrates severe posterior pelvic tilt, L thoracic/R  lumbar scoliosis, and R anterior/L posterior pelvic obliquity. 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 Will be able to cath without pain and will be cleared by physiscian to d/c self-cath due to decreased residual volume with bladder emptying.    Baseline required to cath 3 times per day, painful each time.    Time 10    Period Weeks    Status Achieved      PT LONG TERM GOAL #4   Title Patient will score less than or equal to 40% on the Female NIH-CPSI and 30% on the [facility] to demonstrate a reduction in pain, urinary symptoms, and an improved quality of life.    Baseline Female NIH-CPSI: 38/43 (88%) , PDI: 45/75 (60%); PDI 44/75 (59%) and Female NIH-CPSI: 27/43 (64%) on 02-08-19 , Female NIH-CPSI: 17% on 03-26-19    Time 10    Period Weeks    Status Achieved      PT LONG TERM GOAL #5  Title Patient will demonstrate increased step length with reciprical arm swing, and demonstrate no scissoring steps.    Baseline Pt currently demonstrated scissoring steppage on R LE, increased trunk  rotation and deep core engagement with reciprical walking poles.    Time 10    Period Weeks    Status Achieved      PT LONG TERM GOAL #6   Title Pt will increase her distance on the 6MWT from 747 feet  to  > 900 feet  order to progress with aerobic exercises and walking longer distances in the community  ( 5/10: 1000 ft)     Time 8    Period Weeks    Status Achieved      PT LONG TERM GOAL #7   Title Pt will report waking up > 2 mornings in a row with pain level < 5-6/10 in order to demo maintanence of Tx benefits and more balance of mm tensions achieved with HEP, shoe lift to improve QOL    Baseline 7-8/10 level and benefits from Tx lasted only one morning  (03-19-19 and 03-23-19)    Time 10    Period Weeks    Status Achieved      PT LONG TERM GOAL #8   Title Pt will be able to demo increased PF strength with unilateral UE support on wall from B 3/5 15 reps in order to achieve stronger push off in gait and walk longer distance    Baseline B, L 7 reps  2/5, 10 reps R 3/5  ( 12/17: 15 reps R , 13 reps L, 06/01/19: R 20 reps, L 18 reps  )    Time 10    Period Weeks    Status Achieved      PT LONG TERM GOAL  #9   TITLE Pt will demo shoulder abduction > 120 deg B with improved alignment of thorax over pelvis without manual cues in order to demo proper co-activation of B equal weight bearing in BLE, and decreased thoracic kyhposis/ decreased thoracic rotation 2/2 scoliosis in order to lift plates overhead cabinets/ decreased risk for    Time 10    Period Weeks    Status Achieved      PT LONG TERM GOAL  #10   TITLE Pt will demo increased height from 63" to > 63 1/2 " and maintain 63 1/2" across 3 weeks ( once a week measurement) and forward head posture improve ( decreased distance from wall to earlobe 17 cm from earlobe to wall to > 12 cm)  in order to be ready to progress to PNF exercise with less shoulder pain and lengthen/ strengthen concave curve on R 2/2 scoliosis to minimize L lateral  thigh tightness  (07/06/19 : 63 3/4",  08/10/19: 64"   )    Time 8    Period Weeks    Status Achieved      PT LONG TERM GOAL  #11   TITLE Pt will demo single UE support with lunge position exercises/ yoga poses  in order to demo improved balance and IND with flexibility routine    Baseline BUE support    Time 10    Period Weeks    Status On-going      PT LONG TERM GOAL  #12   TITLE Pt will demo L trunk rotation in R sidelying with zero report of pain in order to progress with longer walking endurance    Time 10    Period Weeks  Status Achieved      PT LONG TERM GOAL  #13   TITLE Pt will demo increased  shoulder abd R against wall in mini squat from 55 deg to > 75 deg in order to demo increased thoracic extension and intercostal lengthening for functional BUE mobility without compensations.  ( 4/2: 88 deg R )    Time 8    Period Weeks    Status Achieved      PT LONG TERM GOAL  #14   TITLE Pt will demo less forward head posture ( earlobe to wall <13  cm) nd maintain height of 64  1/4" for 1 month in order to stand taller w/ less kyphosis and less deviations to spine to walk longer distances ( 11/20/19 : 10.5 cm)      Baseline ( earlobe to wall 16 cm)  ( 64  1/14" on 09/14/19  )     Time 10    Period Weeks    Status Achieved      PT LONG TERM GOAL  #15   TITLE Pt will demo no  ore perineal scar restrictions nor pelvic floor mm tensions and be able to  perform proper coordination of pelvic floor and contraction 3sec, 3 rep in order to minimize leakage.     Time 10    Status Revised                 Plan - 11/29/19 0936    Clinical Impression Statement Pt is progressing well as she reports she is waking up more often with less L thigh pain/ tightness.  Pt continued to require pelvic floor Tx to minimize perineal scar restrictions. Pt achieved more posterior mm lengthening post Tx. Pt 's walking endurance continues to improve with ability to walk double the distance within 6  minute walk test. Pt continues to benefit from skilled PT    Personal Factors and Comorbidities Age;Comorbidity 3+    Comorbidities Osteoporosis, scoliosis, Arthritis, COPD, recent lumbosacral fusion for spondylolisthesis.    Examination-Activity Limitations Toileting;Sit;Stand;Bend;Lift;Carry    Examination-Participation Restrictions Interpersonal Relationship;Yard Work;Cleaning;Laundry    Stability/Clinical Decision Making Unstable/Unpredictable    Rehab Potential Good    PT Frequency 2x / week    PT Duration Other (comment)   20   PT Treatment/Interventions ADLs/Self Care Home Management;Aquatic Therapy;Moist Heat;Electrical Stimulation;Traction;Therapeutic activities;Functional mobility training;Stair training;Gait training;Therapeutic exercise;Balance training;Neuromuscular re-education;Patient/family education;Manual techniques;Dry needling;Passive range of motion;Scar mobilization;Taping    Consulted and Agree with Plan of Care Patient           Patient will benefit from skilled therapeutic intervention in order to improve the following deficits and impairments:  Abnormal gait, Decreased balance, Increased muscle spasms, Decreased range of motion, Decreased scar mobility, Improper body mechanics, Decreased coordination, Decreased strength, Increased fascial restricitons, Impaired flexibility, Postural dysfunction, Pain  Visit Diagnosis: Other idiopathic scoliosis, thoracolumbar region  Chronic right shoulder pain  Other muscle spasm  Abnormal posture     Problem List Patient Active Problem List   Diagnosis Date Noted  . Family history of breast cancer 11/16/2019  . Chronic UTI 12/06/2018  . Status post lumbar surgery 12/06/2018  . Spondylolisthesis, lumbar region 09/26/2018  . Aortic atherosclerosis ([place]) 08/31/2018  . CAD (coronary artery disease) 02/23/2018  . Advanced care planning/counseling discussion 03/28/2017  . Varicose veins of both lower extremities with  complications 51/06/5850  . Arthritis 12/09/2016  . Vaginal atrophy 09/10/2015  . Vasomotor symptoms due to menopause 09/10/2015  . Onychomycosis due to dermatophyte  04/07/2015  . Bursitis of right shoulder 02/26/2015  . Environmental and seasonal allergies 02/26/2015  . Other allergic rhinitis 02/26/2015  . Osteoporosis 11/26/2014  . COPD (chronic obstructive pulmonary disease) ([place]) 11/26/2014  . Fuchs' corneal dystrophy 11/26/2014  . Benign hypertensive renal disease 11/26/2014  . Hyperlipidemia 11/26/2014  . Chronic kidney disease, stage III (moderate) ([place]) 11/26/2014  . Hypothyroidism 11/26/2014  . Endothelial corneal dystrophy 11/26/2014    Jerl Mina ,PT, DPT, E-RYT  11/29/2019, 11:52 AM  [place] MAIN [facility] SERVICES [address] [place], [place], [address] Phone: [phone]   Fax:  [phone]  Name: Julia Deleon MRN: [id number] Date of Birth: [birth year]

## 2019-12-04 ENCOUNTER — Ambulatory Visit: Payer: Medicare Other | Admitting: Physical Therapy

## 2019-12-04 ENCOUNTER — Other Ambulatory Visit: Payer: Self-pay

## 2019-12-04 DIAGNOSIS — M62838 Other muscle spasm: Secondary | ICD-10-CM | POA: Diagnosis not present

## 2019-12-04 DIAGNOSIS — G8929 Other chronic pain: Secondary | ICD-10-CM | POA: Diagnosis not present

## 2019-12-04 DIAGNOSIS — R293 Abnormal posture: Secondary | ICD-10-CM

## 2019-12-04 DIAGNOSIS — M25511 Pain in right shoulder: Secondary | ICD-10-CM | POA: Diagnosis not present

## 2019-12-04 DIAGNOSIS — M4125 Other idiopathic scoliosis, thoracolumbar region: Secondary | ICD-10-CM

## 2019-12-04 NOTE — Therapy (Signed)
[place] MAIN [facility] SERVICES [place], [place], [address] Phone: [phone]   Fax:  [phone]  Physical Therapy Treatment  Patient Details  Name: Julia Deleon MRN: [id number] Date of Birth: 1945-09-22 No data recorded  Encounter Date: 12/04/2019   PT End of Session - 12/04/19 1642    Visit Number 101    Date for PT Re-Evaluation 01/29/20   Progress note to report  (last on #97  11/20/19 )   Authorization Type Medicare    PT Start Time 2542    PT Stop Time 1600    PT Time Calculation (min) 54 min    Activity Tolerance Patient tolerated treatment well;No increased pain    Behavior During Therapy WFL for tasks assessed/performed           Past Medical History:  Diagnosis Date  . Arthritis   . COPD (chronic obstructive pulmonary disease) ([place])   . Family history of adverse reaction to anesthesia    father was slow to wake up  . Fuch's endothelial dystrophy   . Hyperlipidemia   . Hypothyroidism   . PONV (postoperative nausea and vomiting)    slow to wake up and PONV    Past Surgical History:  Procedure Laterality Date  . APPENDECTOMY    . BACK SURGERY  09/26/2018   L4-5 PLIF by Dr. Arnoldo Morale  . CARDIAC CATHETERIZATION    . CHOLECYSTECTOMY    . EYE SURGERY    . FOOT SURGERY Right    pin removed left  . laser vein surgery    . NASAL SINUS SURGERY    . RIGHT/LEFT HEART CATH AND CORONARY ANGIOGRAPHY N/A 12/06/2017   Procedure: RIGHT/LEFT HEART CATH AND CORONARY ANGIOGRAPHY;  Surgeon: Yolonda Kida, MD;  Location: [place] [facility];  Service: Cardiovascular;  Laterality: N/A;  . TONSILLECTOMY      There were no vitals filed for this visit.   Subjective Assessment - 12/04/19 1642    Subjective Pt reported having to drive this weekend and her  neck and shoulders tightened up              [facility] PT Assessment - 12/04/19 1642      Palpation   Palpation comment tightness L upper trap/ scalenes/ levator L >  R , medial scapula L                          [facility] Adult PT Treatment/Exercise - 12/04/19 1642      Ambulation/Gait   Ambulation Distance (Feet) 1079 Feet    Assistive device --   6:01     Therapeutic Activites    Other Therapeutic Activities gait training with hiking poles  , stretches      Moist Heat Therapy   Moist Heat Location --   perineum      Manual Therapy   Internal Pelvic Floor STM/MWM uppr trap/ scalenes/ levator L > R , medial scapula L                     PT Short Term Goals - 06/08/19 1133      PT SHORT TERM GOAL #1   Title Patient will demonstrate improved pelvic alignment and balance of musculature surrounding the pelvis to facilitate decreased PFM spasms and decrease pelvic pain.    Baseline Pt. demonstrates severe posterior pelvic tilt, L thoracic/R lumbar scoliosis, and R anterior/L posterior pelvic obliquity. Pt.  has demonstrated improved pelvic alignment and posture but has not been able to sustain long-term due to continued lack of postural strength.    Time 5    Period Weeks    Status Achieved    Target Date 03/06/19      PT SHORT TERM GOAL #2   Title Patient will demonstrate a coordinated contraction, relaxation, and bulge of the pelvic floor muscles to demonstrate functional recruitment and motion and allow for bladder emptying.    Baseline Pt. is unable to fully empty bladder and describes pain with catheterization, demonstrating high tone of PFM.    Time 5    Period Weeks    Status Achieved    Target Date 03/06/19      PT SHORT TERM GOAL #3   Title Patient will demonstrate improved sitting and standing posture to demonstrate learning and decrease stress on the pelvic floor with functional activity.    Baseline Pt. demonstrates severe posterior pelvic tilt, L thoracic/R lumbar scoliosis, and R anterior/L posterior pelvic obliquity. 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 Will be able to cath without pain and will be cleared by physiscian to d/c self-cath due to decreased residual volume with bladder emptying.    Baseline required to cath 3 times per day, painful each time.    Time 10    Period Weeks    Status Achieved      PT LONG TERM GOAL #4   Title Patient will score less than or equal to 40% on the Female NIH-CPSI and 30% on the [facility] to demonstrate a reduction in pain, urinary symptoms, and an improved quality of life.    Baseline Female NIH-CPSI: 38/43 (88%) , PDI: 45/75 (60%); PDI 44/75 (59%) and Female NIH-CPSI: 27/43 (64%) on 02-08-19 , Female NIH-CPSI: 17% on 03-26-19    Time 10    Period Weeks    Status Achieved      PT LONG TERM GOAL #5   Title Patient will demonstrate increased step length with reciprical arm swing, and demonstrate no scissoring steps.    Baseline Pt currently demonstrated scissoring steppage on R LE, increased trunk rotation and deep core  engagement with reciprical walking poles.    Time 10    Period Weeks    Status Achieved      PT LONG TERM GOAL #6   Title  Pt will increase her distance on the 6MWT from 747 feet  to  > 900 feet  order to progress with aerobic exercises and walking longer distances in the community  ( 5/10: 1000 ft)     Time 8    Period Weeks    Status Achieved      PT LONG TERM GOAL #7   Title Pt will report waking up > 2 mornings in a row with pain level < 5-6/10 in order to demo maintanence of Tx benefits and more balance of mm tensions achieved with HEP, shoe lift to improve QOL    Baseline 7-8/10 level and benefits from Tx lasted only one morning  (03-19-19 and 03-23-19)    Time 10    Period Weeks    Status Achieved      PT LONG TERM GOAL #8   Title Pt will be able to demo increased PF strength with unilateral UE support on wall from B 3/5 15 reps in order to achieve stronger push off in gait and walk longer distance    Baseline B, L 7 reps  2/5, 10 reps R 3/5  ( 12/17: 15 reps R , 13 reps L, 06/01/19: R 20 reps, L 18 reps  )    Time 10    Period Weeks    Status Achieved      PT LONG TERM GOAL  #9   TITLE Pt will demo shoulder abduction > 120 deg B with improved alignment of thorax over pelvis without manual cues in order to demo proper co-activation of B equal weight bearing in BLE, and decreased thoracic kyhposis/ decreased thoracic rotation 2/2 scoliosis in order to lift plates overhead cabinets/ decreased risk for    Time 10    Period Weeks    Status Achieved      PT LONG TERM GOAL  #10   TITLE Pt will demo increased height from 63" to > 63 1/2 " and maintain 63 1/2" across 3 weeks ( once a week measurement) and forward head posture improve ( decreased distance from wall to earlobe 17 cm from earlobe to wall to > 12 cm)  in order to be ready to progress to PNF exercise with less shoulder pain and lengthen/ strengthen concave curve on R 2/2 scoliosis to minimize L lateral thigh tightness  (07/06/19 : 63 3/4",  08/10/19: 64"   )    Time 8    Period Weeks    Status Achieved      PT LONG TERM GOAL  #11   TITLE Pt will  demo single UE support with lunge position exercises/ yoga poses  in order to demo improved balance and IND with flexibility routine    Baseline BUE support    Time 10    Period Weeks    Status On-going      PT LONG TERM GOAL  #12   TITLE Pt will demo L trunk rotation in R sidelying with zero report of pain in order to progress with longer walking endurance    Time 10    Period Weeks    Status Achieved      PT LONG TERM GOAL  #13   TITLE Pt will demo increased  shoulder abd R against wall in mini squat from 55  deg to > 75 deg in order to demo increased thoracic extension and intercostal lengthening for functional BUE mobility without compensations.  ( 4/2: 88 deg R )    Time 8    Period Weeks    Status Achieved      PT LONG TERM GOAL  #14   TITLE Pt will demo less forward head posture ( earlobe to wall <13  cm) nd maintain height of 64  1/4" for 1 month in order to stand taller w/ less kyphosis and less deviations to spine to walk longer distances ( 11/20/19 : 10.5 cm)      Baseline ( earlobe to wall 16 cm)  ( 64  1/14" on 09/14/19  )     Time 10    Period Weeks    Status Achieved      PT LONG TERM GOAL  #15   TITLE Pt will demo no  ore perineal scar restrictions nor pelvic floor mm tensions and be able to  perform proper coordination of pelvic floor and contraction 3sec, 3 rep in order to minimize leakage.     Time 10    Status Revised                 Plan - 12/04/19 1655    Clinical Impression Statement Pt required manual Tx today at L upper quadrant to minimize mm tightness after a long drive. Pt demo'd decreased upper trap/ medial scapular/ levator mm tightness L > R post Tx. Pt continues to decrease her time with walking 1079 ft from 6:10 ( last week) to 6:01 ( this week)  Pt continues to benefit from skilled PT    Personal Factors and Comorbidities Age;Comorbidity 3+    Comorbidities Osteoporosis, scoliosis, Arthritis, COPD, recent lumbosacral fusion for  spondylolisthesis.    Examination-Activity Limitations Toileting;Sit;Stand;Bend;Lift;Carry    Examination-Participation Restrictions Interpersonal Relationship;Yard Work;Cleaning;Laundry    Stability/Clinical Decision Making Unstable/Unpredictable    Rehab Potential Good    PT Frequency 2x / week    PT Duration Other (comment)   20   PT Treatment/Interventions ADLs/Self Care Home Management;Aquatic Therapy;Moist Heat;Electrical Stimulation;Traction;Therapeutic activities;Functional mobility training;Stair training;Gait training;Therapeutic exercise;Balance training;Neuromuscular re-education;Patient/family education;Manual techniques;Dry needling;Passive range of motion;Scar mobilization;Taping    Consulted and Agree with Plan of Care Patient           Patient will benefit from skilled therapeutic intervention in order to improve the following deficits and impairments:  Abnormal gait, Decreased balance, Increased muscle spasms, Decreased range of motion, Decreased scar mobility, Improper body mechanics, Decreased coordination, Decreased strength, Increased fascial restricitons, Impaired flexibility, Postural dysfunction, Pain  Visit Diagnosis: Other idiopathic scoliosis, thoracolumbar region  Chronic right shoulder pain  Other muscle spasm  Abnormal posture     Problem List Patient Active Problem List   Diagnosis Date Noted  . Family history of breast cancer 11/16/2019  . Chronic UTI 12/06/2018  . Status post lumbar surgery 12/06/2018  . Spondylolisthesis, lumbar region 09/26/2018  . Aortic atherosclerosis ([place]) 08/31/2018  . CAD (coronary artery disease) 02/23/2018  . Advanced care planning/counseling discussion 03/28/2017  . Varicose veins of both lower extremities with complications 30/16/0109  . Arthritis 12/09/2016  . Vaginal atrophy 09/10/2015  . Vasomotor symptoms due to menopause 09/10/2015  . Onychomycosis due to dermatophyte 04/07/2015  . Bursitis of right  shoulder 02/26/2015  . Environmental and seasonal allergies 02/26/2015  . Other allergic rhinitis 02/26/2015  . Osteoporosis 11/26/2014  . COPD (chronic obstructive pulmonary disease) ([place]) 11/26/2014  . Fuchs'  corneal dystrophy 11/26/2014  . Benign hypertensive renal disease 11/26/2014  . Hyperlipidemia 11/26/2014  . Chronic kidney disease, stage III (moderate) ([place]) 11/26/2014  . Hypothyroidism 11/26/2014  . Endothelial corneal dystrophy 11/26/2014    Jerl Mina ,PT, DPT, E-RYT  12/04/2019, 4:56 PM  [place] MAIN [facility] SERVICES [address] [place], [place], [address] Phone: [phone]   Fax:  [phone]  Name: Julia Deleon MRN: [id number] Date of Birth: [birth year]

## 2019-12-06 ENCOUNTER — Other Ambulatory Visit: Payer: Self-pay

## 2019-12-06 ENCOUNTER — Ambulatory Visit
Admission: RE | Admit: 2019-12-06 | Discharge: 2019-12-06 | Disposition: A | Discharge status: Home / Self Care | Payer: Medicare Other | Source: Ambulatory Visit | Attending: Obstetrics and Gynecology | Admitting: Obstetrics and Gynecology

## 2019-12-06 DIAGNOSIS — Z1231 Encounter for screening mammogram for malignant neoplasm of breast: Secondary | ICD-10-CM

## 2019-12-07 ENCOUNTER — Ambulatory Visit: Payer: Medicare Other | Admitting: Physical Therapy

## 2019-12-07 DIAGNOSIS — M62838 Other muscle spasm: Secondary | ICD-10-CM

## 2019-12-07 DIAGNOSIS — R293 Abnormal posture: Secondary | ICD-10-CM

## 2019-12-07 DIAGNOSIS — M25511 Pain in right shoulder: Secondary | ICD-10-CM | POA: Diagnosis not present

## 2019-12-07 DIAGNOSIS — M4125 Other idiopathic scoliosis, thoracolumbar region: Secondary | ICD-10-CM | POA: Diagnosis not present

## 2019-12-07 DIAGNOSIS — G8929 Other chronic pain: Secondary | ICD-10-CM | POA: Diagnosis not present

## 2019-12-07 NOTE — Therapy (Signed)
[place] MAIN [facility] SERVICES [place], [place], [address] Phone: [phone]   Fax:  [phone]  Physical Therapy Treatment  Patient Details  Name: Julia Deleon MRN: [id number] Date of Birth: [birth year] No data recorded  Encounter Date: 12/07/2019   PT End of Session - 12/07/19 1025    Visit Number 102    Date for PT Re-Evaluation 01/29/20   Progress note to report  (last on #97  11/20/19 )   Authorization Type Medicare    PT Start Time 0803    PT Stop Time 0902    PT Time Calculation (min) 59 min    Activity Tolerance Patient tolerated treatment well;No increased pain    Behavior During Therapy WFL for tasks assessed/performed           Past Medical History:  Diagnosis Date  . Arthritis   . COPD (chronic obstructive pulmonary disease) ([place])   . Family history of adverse reaction to anesthesia    father was slow to wake up  . Fuch's endothelial dystrophy   . Hyperlipidemia   . Hypothyroidism   . PONV (postoperative nausea and vomiting)    slow to wake up and PONV    Past Surgical History:  Procedure Laterality Date  . APPENDECTOMY    . BACK SURGERY  09/26/2018   L4-5 PLIF by Dr. Arnoldo Morale  . CARDIAC CATHETERIZATION    . CHOLECYSTECTOMY    . EYE SURGERY    . FOOT SURGERY Right    pin removed left  . laser vein surgery    . NASAL SINUS SURGERY    . RIGHT/LEFT HEART CATH AND CORONARY ANGIOGRAPHY N/A 12/06/2017   Procedure: RIGHT/LEFT HEART CATH AND CORONARY ANGIOGRAPHY;  Surgeon: Yolonda Kida, MD;  Location: [facility];  Service: Cardiovascular;  Laterality: N/A;  . TONSILLECTOMY      There were no vitals filed for this visit.   Subjective Assessment - 12/07/19 1028    Subjective Pt reported she preferred to no have internal pelvic Tx because she is leaving for a trip after today's session              [facility] PT Assessment - 12/07/19 1028      Palpation   Palpation comment tightness  adductor B, ischial rami attachments  . Referred pain to L thigh/ knee with palpation at coccygeus/ ischial rami                          [facility] Adult PT Treatment/Exercise - 12/07/19 1027      Manual Therapy   Manual therapy comments STM/MWM adductor B, ischial rami attachments                      PT Short Term Goals - 06/08/19 1133      PT SHORT TERM GOAL #1   Title Patient will demonstrate improved pelvic alignment and balance of musculature surrounding the pelvis to facilitate decreased PFM spasms and decrease pelvic pain.    Baseline Pt. demonstrates severe posterior pelvic tilt, L thoracic/R lumbar scoliosis, and R anterior/L posterior pelvic obliquity. Pt. has demonstrated improved pelvic alignment and posture but has not been able to sustain long-term due to continued lack of postural strength.    Time 5    Period Weeks    Status Achieved    Target Date 03/06/19      PT SHORT TERM GOAL #  2   Title Patient will demonstrate a coordinated contraction, relaxation, and bulge of the pelvic floor muscles to demonstrate functional recruitment and motion and allow for bladder emptying.    Baseline Pt. is unable to fully empty bladder and describes pain with catheterization, demonstrating high tone of PFM.    Time 5    Period Weeks    Status Achieved    Target Date 03/06/19      PT SHORT TERM GOAL #3   Title Patient will demonstrate improved sitting and standing posture to demonstrate learning and decrease stress on the pelvic floor with functional activity.    Baseline Pt. demonstrates severe posterior pelvic tilt, L thoracic/R lumbar scoliosis, and R anterior/L posterior pelvic obliquity. 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 Will be able to cath without pain and will be cleared by physiscian to d/c self-cath due to decreased residual volume with bladder emptying.    Baseline required to cath 3 times per day, painful each time.    Time 10    Period Weeks    Status Achieved      PT LONG TERM GOAL #4   Title Patient will score less than or equal to 40% on the Female NIH-CPSI and 30% on the [facility] to demonstrate a reduction in pain, urinary symptoms, and an improved quality of life.    Baseline Female NIH-CPSI: 38/43 (88%) , PDI: 45/75 (60%); PDI 44/75 (59%) and Female NIH-CPSI: 27/43 (64%) on 02-08-19 , Female NIH-CPSI: 17% on 03-26-19    Time 10    Period Weeks    Status Achieved      PT LONG TERM GOAL #5   Title Patient will demonstrate increased step length with reciprical arm swing, and demonstrate no scissoring steps.    Baseline Pt currently demonstrated scissoring steppage on R LE, increased trunk rotation and deep core engagement with reciprical walking poles.    Time 10    Period Weeks    Status Achieved      PT LONG TERM GOAL #6   Title Pt will increase her distance on the 6MWT from 747 feet  to  > 900 feet  order  to progress with aerobic exercises and walking longer distances in the community  ( 5/10: 1000 ft)     Time 8      Period Weeks    Status Achieved      PT LONG TERM GOAL #7   Title Pt will report waking up > 2 mornings in a row with pain level < 5-6/10 in order to demo maintanence of Tx benefits and more balance of mm tensions achieved with HEP, shoe lift to improve QOL    Baseline 7-8/10 level and benefits from Tx lasted only one morning  (03-19-19 and 03-23-19)    Time 10    Period Weeks    Status Achieved      PT LONG TERM GOAL #8   Title Pt will be able to demo increased PF strength with unilateral UE support on wall from B 3/5 15 reps in order to achieve stronger push off in gait and walk longer distance    Baseline B, L 7 reps  2/5, 10 reps R 3/5  ( 12/17: 15 reps R , 13 reps L, 06/01/19: R 20 reps, L 18 reps  )    Time 10    Period Weeks    Status Achieved      PT LONG TERM GOAL  #9   TITLE Pt will demo shoulder abduction > 120 deg B with improved alignment of thorax over pelvis without manual cues in order to demo proper co-activation of B equal weight bearing in BLE, and decreased thoracic kyhposis/ decreased thoracic rotation 2/2 scoliosis in order to lift plates overhead cabinets/ decreased risk for    Time 10    Period Weeks    Status Achieved      PT LONG TERM GOAL  #10   TITLE Pt will demo increased height from 63" to > 63 1/2 " and maintain 63 1/2" across 3 weeks ( once a week measurement) and forward head posture improve ( decreased distance from wall to earlobe 17 cm from earlobe to wall to > 12 cm)  in order to be ready to progress to PNF exercise with less shoulder pain and lengthen/ strengthen concave curve on R 2/2 scoliosis to minimize L lateral thigh tightness  (07/06/19 : 63 3/4",  08/10/19: 64"   )    Time 8    Period Weeks    Status Achieved      PT LONG TERM GOAL  #11   TITLE Pt will demo single UE support with lunge position exercises/ yoga poses  in order to demo improved balance and IND with flexibility routine    Baseline BUE support    Time 10    Period Weeks     Status On-going      PT LONG TERM GOAL  #12   TITLE Pt will demo L trunk rotation in R sidelying with zero report of pain in order to progress with longer walking endurance    Time 10    Period Weeks    Status Achieved      PT LONG TERM GOAL  #13   TITLE Pt will demo increased  shoulder abd R against wall in mini squat from 55 deg to > 75 deg in order to demo increased thoracic extension and intercostal lengthening for functional BUE mobility without compensations.  ( 4/2: 88 deg R )    Time 8    Period Weeks    Status Achieved      PT LONG TERM  GOAL  #14   TITLE Pt will demo less forward head posture ( earlobe to wall <13  cm) nd maintain height of 64  1/4" for 1 month in order to stand taller w/ less kyphosis and less deviations to spine to walk longer distances ( 11/20/19 : 10.5 cm)      Baseline ( earlobe to wall 16 cm)  ( 64  1/14" on 09/14/19  )     Time 10    Period Weeks    Status Achieved      PT LONG TERM GOAL  #15   TITLE Pt will demo no  ore perineal scar restrictions nor pelvic floor mm tensions and be able to  perform proper coordination of pelvic floor and contraction 3sec, 3 rep in order to minimize leakage.     Time 10    Status Revised                 Plan - 12/07/19 1025    Clinical Impression Statement Pt required manual Tx to minimize adductor mm tightness B. Pt reported decreased tenderness with less referred pain to L thigh./knee with palpation at ischial rami attachments. Pt required cues for pelvic tilts when feeling tightness in back after session. Pelvic tilts alleviated tightenss and pt was encouraged to utilize HEP to maintain mobility to SIJ./ hips.   Pt continues to benefit from skilled PT    Personal Factors and Comorbidities Age;Comorbidity 3+    Comorbidities Osteoporosis, scoliosis, Arthritis, COPD, recent lumbosacral fusion for spondylolisthesis.    Examination-Activity Limitations Toileting;Sit;Stand;Bend;Lift;Carry     Examination-Participation Restrictions Interpersonal Relationship;Yard Work;Cleaning;Laundry    Stability/Clinical Decision Making Unstable/Unpredictable    Rehab Potential Good    PT Frequency 2x / week    PT Duration Other (comment)   20   PT Treatment/Interventions ADLs/Self Care Home Management;Aquatic Therapy;Moist Heat;Electrical Stimulation;Traction;Therapeutic activities;Functional mobility training;Stair training;Gait training;Therapeutic exercise;Balance training;Neuromuscular re-education;Patient/family education;Manual techniques;Dry needling;Passive range of motion;Scar mobilization;Taping    Consulted and Agree with Plan of Care Patient           Patient will benefit from skilled therapeutic intervention in order to improve the following deficits and impairments:  Abnormal gait, Decreased balance, Increased muscle spasms, Decreased range of motion, Decreased scar mobility, Improper body mechanics, Decreased coordination, Decreased strength, Increased fascial restricitons, Impaired flexibility, Postural dysfunction, Pain  Visit Diagnosis: Other idiopathic scoliosis, thoracolumbar region  Chronic right shoulder pain  Other muscle spasm  Abnormal posture     Problem List Patient Active Problem List   Diagnosis Date Noted  . Family history of breast cancer 11/16/2019  . Chronic UTI 12/06/2018  . Status post lumbar surgery 12/06/2018  . Spondylolisthesis, lumbar region 09/26/2018  . Aortic atherosclerosis ([place]) 08/31/2018  . CAD (coronary artery disease) 02/23/2018  . Advanced care planning/counseling discussion 03/28/2017  . Varicose veins of both lower extremities with complications 14/97/0263  . Arthritis 12/09/2016  . Vaginal atrophy 09/10/2015  . Vasomotor symptoms due to menopause 09/10/2015  . Onychomycosis due to dermatophyte 04/07/2015  . Bursitis of right shoulder 02/26/2015  . Environmental and seasonal allergies 02/26/2015  . Other allergic rhinitis  02/26/2015  . Osteoporosis 11/26/2014  . COPD (chronic obstructive pulmonary disease) ([place]) 11/26/2014  . Fuchs' corneal dystrophy 11/26/2014  . Benign hypertensive renal disease 11/26/2014  . Hyperlipidemia 11/26/2014  . Chronic kidney disease, stage III (moderate) ([place]) 11/26/2014  . Hypothyroidism 11/26/2014  . Endothelial corneal dystrophy 11/26/2014    Jerl Mina ,PT, DPT, E-RYT  12/07/2019, 10:29  [place] MAIN [facility] SERVICES [address] [place], [place], [address] Phone: [phone]   Fax:  [phone]  Name: Julia Deleon MRN: [id number] Date of Birth: July 06, 1945

## 2019-12-09 ENCOUNTER — Encounter: Payer: Self-pay | Admitting: Obstetrics and Gynecology

## 2019-12-11 ENCOUNTER — Other Ambulatory Visit: Payer: Self-pay

## 2019-12-11 ENCOUNTER — Ambulatory Visit: Payer: Medicare Other | Attending: Urology | Admitting: Physical Therapy

## 2019-12-11 DIAGNOSIS — M533 Sacrococcygeal disorders, not elsewhere classified: Secondary | ICD-10-CM | POA: Insufficient documentation

## 2019-12-11 DIAGNOSIS — R293 Abnormal posture: Secondary | ICD-10-CM | POA: Diagnosis not present

## 2019-12-11 DIAGNOSIS — M62838 Other muscle spasm: Secondary | ICD-10-CM | POA: Diagnosis not present

## 2019-12-11 DIAGNOSIS — M25511 Pain in right shoulder: Secondary | ICD-10-CM | POA: Insufficient documentation

## 2019-12-11 DIAGNOSIS — M4125 Other idiopathic scoliosis, thoracolumbar region: Secondary | ICD-10-CM | POA: Insufficient documentation

## 2019-12-11 DIAGNOSIS — G8929 Other chronic pain: Secondary | ICD-10-CM | POA: Diagnosis not present

## 2019-12-11 NOTE — Therapy (Signed)
[place] MAIN [facility] SERVICES [address] [place], [place], [address] Phone: [phone]   Fax:  [phone]  Physical Therapy Treatment  Patient Details  Name: Julia Deleon MRN: [id number] Date of Birth: [birth year] No data recorded  Encounter Date: 12/11/2019   PT End of Session - 12/11/19 2025    Visit Number 103    Date for PT Re-Evaluation 01/29/20   Progress note to report  (last on #97  11/20/19 )   Authorization Type Medicare    PT Start Time 0902    PT Stop Time 1000    PT Time Calculation (min) 58 min    Activity Tolerance Patient tolerated treatment well;No increased pain    Behavior During Therapy WFL for tasks assessed/performed           Past Medical History:  Diagnosis Date  . Arthritis   . COPD (chronic obstructive pulmonary disease) ([place])   . Family history of adverse reaction to anesthesia    father was slow to wake up  . Fuch's endothelial dystrophy   . Hyperlipidemia   . Hypothyroidism   . PONV (postoperative nausea and vomiting)    slow to wake up and PONV    Past Surgical History:  Procedure Laterality Date  . APPENDECTOMY    . BACK SURGERY  09/26/2018   L4-5 PLIF by Dr. Arnoldo Morale  . CARDIAC CATHETERIZATION    . CHOLECYSTECTOMY    . EYE SURGERY    . FOOT SURGERY Right    pin removed left  . laser vein surgery    . NASAL SINUS SURGERY    . RIGHT/LEFT HEART CATH AND CORONARY ANGIOGRAPHY N/A 12/06/2017   Procedure: RIGHT/LEFT HEART CATH AND CORONARY ANGIOGRAPHY;  Surgeon: Yolonda Kida, MD;  Location: [facility];  Service: Cardiovascular;  Laterality: N/A;  . TONSILLECTOMY      There were no vitals filed for this visit.   Subjective Assessment - 12/11/19 0909    Subjective Pt reported no increased pain after last session. Pt has returned to treadmill walking and walked for 6 min, resting hands on handle bars and had radiating pain down L thigh at 3-4 min. Pt had not been walking with hiking  poles at home.              [facility] PT Assessment - 12/11/19 1028      Ambulation/Gait   Ambulation Distance (Feet) 1530 Feet    Assistive device --   hiking poles   Gait Comments pre Tx: pt walked into clinic with R trunk lean           Observations: standing lunge with genu valgus in front L leg                [facility] Adult PT Treatment/Exercise - 12/11/19 1028      Therapeutic Activites    Other Therapeutic Activities explained the importance of hiking poles and withhold from treadmill walking and rationale, importance of minimizing worsening of scoliosis , co-creating long term goals to build endurance with walking       Exercises   Other Exercises  stretches                     PT Short Term Goals - 06/08/19 1133      PT SHORT TERM GOAL #1   Title Patient will demonstrate improved pelvic alignment and balance of musculature surrounding the pelvis to facilitate decreased PFM spasms  and decrease pelvic pain.    Baseline Pt. demonstrates severe posterior pelvic tilt, L thoracic/R lumbar scoliosis, and R anterior/L posterior pelvic obliquity. Pt. has demonstrated improved pelvic alignment and posture but has not been able to sustain long-term due to continued lack of postural strength.    Time 5    Period Weeks    Status Achieved    Target Date 03/06/19      PT SHORT TERM GOAL #2   Title Patient will demonstrate a coordinated contraction, relaxation, and bulge of the pelvic floor muscles to demonstrate functional recruitment and motion and allow for bladder emptying.    Baseline Pt. is unable to fully empty bladder and describes pain with catheterization, demonstrating high tone of PFM.    Time 5    Period Weeks    Status Achieved    Target Date 03/06/19      PT SHORT TERM GOAL #3   Title Patient will demonstrate improved sitting and standing posture to demonstrate learning and decrease stress on the pelvic floor with functional activity.    Baseline  Pt. demonstrates severe posterior pelvic tilt, L thoracic/R lumbar scoliosis, and R anterior/L posterior pelvic obliquity. 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 Will be able to cath without pain and will be cleared by physiscian to d/c self-cath due to decreased residual volume with bladder emptying.    Baseline required to cath 3 times per day, painful each time.    Time 10    Period Weeks    Status Achieved      PT LONG TERM GOAL #4   Title Patient will score less than or equal to 40% on the Female NIH-CPSI and 30% on the [facility] to demonstrate a reduction in pain, urinary symptoms, and an improved quality of life.    Baseline Female NIH-CPSI: 38/43 (88%) , PDI: 45/75 (60%); PDI 44/75 (59%) and Female NIH-CPSI: 27/43 (64%) on 02-08-19 , Female NIH-CPSI: 17% on 03-26-19    Time 10    Period Weeks    Status Achieved      PT LONG TERM GOAL #5   Title Patient will demonstrate increased step length with reciprical  arm swing, and demonstrate no scissoring steps.    Baseline Pt currently  demonstrated scissoring steppage on R LE, increased trunk rotation and deep core engagement with reciprical walking poles.    Time 10    Period Weeks    Status Achieved      PT LONG TERM GOAL #6   Title Pt will increase her distance on the 6MWT from 1079  feet  to  > 1320 feet  order to progress with aerobic exercises and walking longer distances in the community  ( 5/10: 1000 ft,  8/3: )     Time 8    Period Weeks    Status Revised      PT LONG TERM GOAL #7   Title Pt will report waking up > 2 mornings in a row with pain level < 5-6/10 in order to demo maintanence of Tx benefits and more balance of mm tensions achieved with HEP, shoe lift to improve QOL    Baseline 7-8/10 level and benefits from Tx lasted only one morning  (03-19-19 and 03-23-19)    Time 10    Period Weeks    Status Achieved      PT LONG TERM GOAL #8   Title Pt will be able to demo increased PF strength with unilateral UE support on wall from B 3/5 15 reps in order to achieve stronger push off in gait and walk longer distance    Baseline B, L 7 reps  2/5, 10 reps R 3/5  ( 12/17: 15 reps R , 13 reps L, 06/01/19: R 20 reps, L 18 reps  )    Time 10    Period Weeks    Status Achieved      PT LONG TERM GOAL  #9   TITLE Pt will demo shoulder abduction > 120 deg B with improved alignment of thorax over pelvis without manual cues in order to demo proper co-activation of B equal weight bearing in BLE, and decreased thoracic kyhposis/ decreased thoracic rotation 2/2 scoliosis in order to lift plates overhead cabinets/ decreased risk for    Time 10    Period Weeks    Status Achieved      PT LONG TERM GOAL  #10   TITLE Pt will demo increased height from 63" to > 63 1/2 " and maintain 63 1/2" across 3 weeks ( once a week measurement) and forward head posture improve ( decreased distance from wall to earlobe 17 cm from earlobe to wall to > 12 cm)  in order to be ready to progress to PNF exercise with less shoulder pain and lengthen/  strengthen concave curve on R 2/2 scoliosis to minimize L lateral thigh tightness  (07/06/19 : 63 3/4",  08/10/19: 64"   )    Time 8    Period Weeks    Status Achieved      PT LONG TERM GOAL  #11   TITLE Pt will demo single UE support with lunge position exercises/ yoga poses  in order to demo improved balance and IND with flexibility routine    Baseline BUE support    Time 10    Period Weeks    Status On-going      PT LONG TERM GOAL  #12   TITLE Pt will demo L trunk rotation in R sidelying with zero report of pain in order to progress with longer walking endurance    Time 10    Period Weeks    Status Achieved  PT LONG TERM GOAL  #13   TITLE Pt will demo increased  shoulder abd R against wall in mini squat from 55 deg to > 75 deg in order to demo increased thoracic extension and intercostal lengthening for functional BUE mobility without compensations.  ( 4/2: 88 deg R )    Time 8    Period Weeks    Status Achieved      PT LONG TERM GOAL  #14   TITLE Pt will demo less forward head posture ( earlobe to wall <13  cm) nd maintain height of 64  1/4" for 1 month in order to stand taller w/ less kyphosis and less deviations to spine to walk longer distances ( 11/20/19 : 10.5 cm)      Baseline ( earlobe to wall 16 cm)  ( 64  1/14" on 09/14/19  )     Time 10    Period Weeks    Status Achieved      PT LONG TERM GOAL  #15   TITLE Pt will demo no  ore perineal scar restrictions nor pelvic floor mm tensions and be able to  perform proper coordination of pelvic floor and contraction 3sec, 3 rep in order to minimize leakage.     Time 10    Status Revised                 Plan - 12/11/19 7322    Clinical Impression Statement Pt achieved increased distance with hiking poles today from 1079 ft to 1530 ft but with increased time from 6 min to 9 min. Explained the importance of hiking poles and withhold from treadmill walking and rationale, importance of minimizing worsening of scoliosis ,  co-creating long term goals to build endurance with walking. Plan to advance to standing dynamic balance yoga poses to increase propioception and proper knee alignment to minimize genu valgus. Pt continues to benefit from skilled PT.     Personal Factors and Comorbidities Age;Comorbidity 3+    Comorbidities Osteoporosis, scoliosis, Arthritis, COPD, recent lumbosacral fusion for spondylolisthesis.    Examination-Activity Limitations Toileting;Sit;Stand;Bend;Lift;Carry    Examination-Participation Restrictions Interpersonal Relationship;Yard Work;Cleaning;Laundry    Stability/Clinical Decision Making Unstable/Unpredictable    Rehab Potential Good    PT Frequency 2x / week    PT Duration Other (comment)   20   PT Treatment/Interventions ADLs/Self Care Home Management;Aquatic Therapy;Moist Heat;Electrical Stimulation;Traction;Therapeutic activities;Functional mobility training;Stair training;Gait training;Therapeutic exercise;Balance training;Neuromuscular re-education;Patient/family education;Manual techniques;Dry needling;Passive range of motion;Scar mobilization;Taping    Consulted and Agree with Plan of Care Patient           Patient will benefit from skilled therapeutic intervention in order to improve the following deficits and impairments:  Abnormal gait, Decreased balance, Increased muscle spasms, Decreased range of motion, Decreased scar mobility, Improper body mechanics, Decreased coordination, Decreased strength, Increased fascial restricitons, Impaired flexibility, Postural dysfunction, Pain  Visit Diagnosis: Abnormal posture  Chronic right shoulder pain  Other muscle spasm     Problem List Patient Active Problem List   Diagnosis Date Noted  . Family history of breast cancer 11/16/2019  . Chronic UTI 12/06/2018  . Status post lumbar surgery 12/06/2018  . Spondylolisthesis, lumbar region 09/26/2018  . Aortic atherosclerosis ([place]) 08/31/2018  . CAD (coronary artery disease)  02/23/2018  . Advanced care planning/counseling discussion 03/28/2017  . Varicose veins of both lower extremities with complications 02/54/2706  . Arthritis 12/09/2016  . Vaginal atrophy 09/10/2015  . Vasomotor symptoms due to menopause 09/10/2015  . Onychomycosis due  to dermatophyte 04/07/2015  . Bursitis of right shoulder 02/26/2015  . Environmental and seasonal allergies 02/26/2015  . Other allergic rhinitis 02/26/2015  . Osteoporosis 11/26/2014  . COPD (chronic obstructive pulmonary disease) ([place]) 11/26/2014  . Fuchs' corneal dystrophy 11/26/2014  . Benign hypertensive renal disease 11/26/2014  . Hyperlipidemia 11/26/2014  . Chronic kidney disease, stage III (moderate) ([place]) 11/26/2014  . Hypothyroidism 11/26/2014  . Endothelial corneal dystrophy 11/26/2014    Jerl Mina ,PT, DPT, E-RYT  12/11/2019, 10:35 AM  [place] MAIN [facility] SERVICES [address] [place], [place], [address] Phone: [phone]   Fax:  [phone]  Name: Julia Deleon MRN: [id number] Date of Birth: 28-Oct-1945

## 2019-12-11 NOTE — Patient Instructions (Addendum)
Do not walk on the treadmill for loosening Instead, perform stretches prior and after your trainer session   ___  6 min with hiking poles  And long term goal for fall season is to walk  0.25   mile with hiking poles at the running track 2 x week ( 1320 ft).  The past months, you walked 1079 ft within 6 min.  ___

## 2019-12-12 DIAGNOSIS — Z20828 Contact with and (suspected) exposure to other viral communicable diseases: Secondary | ICD-10-CM | POA: Diagnosis not present

## 2019-12-12 DIAGNOSIS — J0100 Acute maxillary sinusitis, unspecified: Secondary | ICD-10-CM | POA: Diagnosis not present

## 2019-12-12 DIAGNOSIS — Z03818 Encounter for observation for suspected exposure to other biological agents ruled out: Secondary | ICD-10-CM | POA: Diagnosis not present

## 2019-12-12 DIAGNOSIS — R05 Cough: Secondary | ICD-10-CM | POA: Diagnosis not present

## 2019-12-12 DIAGNOSIS — R51 Headache with orthostatic component, not elsewhere classified: Secondary | ICD-10-CM | POA: Diagnosis not present

## 2019-12-12 DIAGNOSIS — Z20822 Contact with and (suspected) exposure to covid-19: Secondary | ICD-10-CM | POA: Diagnosis not present

## 2019-12-14 ENCOUNTER — Ambulatory Visit: Payer: Medicare Other | Admitting: Physical Therapy

## 2019-12-18 ENCOUNTER — Ambulatory Visit: Payer: Medicare Other | Admitting: Physical Therapy

## 2019-12-18 ENCOUNTER — Other Ambulatory Visit: Payer: Self-pay

## 2019-12-18 DIAGNOSIS — M62838 Other muscle spasm: Secondary | ICD-10-CM

## 2019-12-18 DIAGNOSIS — M4125 Other idiopathic scoliosis, thoracolumbar region: Secondary | ICD-10-CM

## 2019-12-18 DIAGNOSIS — M25511 Pain in right shoulder: Secondary | ICD-10-CM | POA: Diagnosis not present

## 2019-12-18 DIAGNOSIS — R293 Abnormal posture: Secondary | ICD-10-CM

## 2019-12-18 DIAGNOSIS — G8929 Other chronic pain: Secondary | ICD-10-CM

## 2019-12-18 DIAGNOSIS — M533 Sacrococcygeal disorders, not elsewhere classified: Secondary | ICD-10-CM | POA: Diagnosis not present

## 2019-12-18 NOTE — Therapy (Addendum)
[place] MAIN [facility] SERVICES [address] [place], [place], [address] Phone: [phone]   Fax:  [phone]  Physical Therapy Treatment  Patient Details  Name: Julia Deleon MRN: [id number] Date of Birth: [birth year] No data recorded  Encounter Date: 12/18/2019   PT End of Session - 12/18/19 0958    Visit Number 104    Date for PT Re-Evaluation 01/29/20   Progress note to report  (last on #97  11/20/19 )   Authorization Type Medicare    PT Start Time 1006    PT Stop Time 1100    PT Time Calculation (min) 54 min    Activity Tolerance Patient tolerated treatment well;No increased pain    Behavior During Therapy WFL for tasks assessed/performed           Past Medical History:  Diagnosis Date  . Arthritis   . COPD (chronic obstructive pulmonary disease) ([place])   . Family history of adverse reaction to anesthesia    father was slow to wake up  . Fuch's endothelial dystrophy   . Hyperlipidemia   . Hypothyroidism   . PONV (postoperative nausea and vomiting)    slow to wake up and PONV    Past Surgical History:  Procedure Laterality Date  . APPENDECTOMY    . BACK SURGERY  09/26/2018   L4-5 PLIF by Dr. Arnoldo Morale  . CARDIAC CATHETERIZATION    . CHOLECYSTECTOMY    . EYE SURGERY    . FOOT SURGERY Right    pin removed left  . laser vein surgery    . NASAL SINUS SURGERY    . RIGHT/LEFT HEART CATH AND CORONARY ANGIOGRAPHY N/A 12/06/2017   Procedure: RIGHT/LEFT HEART CATH AND CORONARY ANGIOGRAPHY;  Surgeon: Yolonda Kida, MD;  Location: [facility];  Service: Cardiovascular;  Laterality: N/A;  . TONSILLECTOMY      There were no vitals filed for this visit.   Subjective Assessment - 12/18/19 0912    Subjective Pt reported she had radiating pain down her L leg but it did not stop her from walking the 2.75 laps during the session.              [facility] PT Assessment - 12/18/19 0949      Palpation   Palpation comment L  adductor no tightness,  R peroneal longus/ brevis/ adducts,  extensor digitorium, Medial knee attachments ( referred pain to LBP pre Tx, no pain post Tx)                          [facility] Adult PT Treatment/Exercise - 12/18/19 0954      Therapeutic Activites    Other Therapeutic Activities self-management of the L tightness ( step distraction technique,  hamstring stretch with strap       Manual Therapy   Manual therapy comments STM/MWM medial/ lateral glide at areas noted in assessment                     PT Short Term Goals - 06/08/19 1133      PT SHORT TERM GOAL #1   Title Patient will demonstrate improved pelvic alignment and balance of musculature surrounding the pelvis to facilitate decreased PFM spasms and decrease pelvic pain.    Baseline Pt. demonstrates severe posterior pelvic tilt, L thoracic/R lumbar scoliosis, and R anterior/L posterior pelvic obliquity. Pt. has demonstrated improved pelvic alignment and posture but has not been  able to sustain long-term due to continued lack of postural strength.    Time 5    Period Weeks    Status Achieved    Target Date 03/06/19      PT SHORT TERM GOAL #2   Title Patient will demonstrate a coordinated contraction, relaxation, and bulge of the pelvic floor muscles to demonstrate functional recruitment and motion and allow for bladder emptying.    Baseline Pt. is unable to fully empty bladder and describes pain with catheterization, demonstrating high tone of PFM.    Time 5    Period Weeks    Status Achieved    Target Date 03/06/19      PT SHORT TERM GOAL #3   Title Patient will demonstrate improved sitting and standing posture to demonstrate learning and decrease stress on the pelvic floor with functional activity.    Baseline Pt. demonstrates severe posterior pelvic tilt, L thoracic/R lumbar scoliosis, and R anterior/L posterior pelvic obliquity. 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 Will be able to cath without pain and will be cleared by physiscian to d/c self-cath due to decreased residual volume with bladder emptying.    Baseline required to cath 3 times per day, painful each time.    Time 10    Period Weeks    Status Achieved      PT LONG TERM GOAL #4   Title Patient will score less than or equal to 40% on the Female NIH-CPSI and 30% on the [facility] to demonstrate a reduction in pain, urinary symptoms, and an improved quality of life.    Baseline Female NIH-CPSI: 38/43 (88%) , PDI: 45/75 (60%); PDI 44/75 (59%) and Female NIH-CPSI: 27/43 (64%) on 02-08-19 , Female NIH-CPSI: 17% on 03-26-19    Time 10    Period Weeks    Status Achieved      PT LONG TERM GOAL #5   Title Patient will demonstrate increased step length with reciprical arm swing, and demonstrate no scissoring steps.    Baseline Pt currently demonstrated scissoring steppage on R LE, increased trunk rotation and deep core engagement with reciprical walking poles.    Time 10  Period Weeks    Status Achieved      PT LONG  TERM GOAL #6   Title Pt will increase her distance on the 6MWT from 1079  feet  to  > 1320 feet  order to progress with aerobic exercises and walking longer distances in the community  ( 5/10: 1000 ft,  8/3: )     Time 8    Period Weeks    Status Revised      PT LONG TERM GOAL #7   Title Pt will report waking up > 2 mornings in a row with pain level < 5-6/10 in order to demo maintanence of Tx benefits and more balance of mm tensions achieved with HEP, shoe lift to improve QOL    Baseline 7-8/10 level and benefits from Tx lasted only one morning  (03-19-19 and 03-23-19)    Time 10    Period Weeks    Status Achieved      PT LONG TERM GOAL #8   Title Pt will be able to demo increased PF strength with unilateral UE support on wall from B 3/5 15 reps in order to achieve stronger push off in gait and walk longer distance    Baseline B, L 7 reps  2/5, 10 reps R 3/5  ( 12/17: 15 reps R , 13 reps L, 06/01/19: R 20 reps, L 18 reps  )    Time 10    Period Weeks    Status Achieved      PT LONG TERM GOAL  #9   TITLE Pt will demo shoulder abduction > 120 deg B with improved alignment of thorax over pelvis without manual cues in order to demo proper co-activation of B equal weight bearing in BLE, and decreased thoracic kyhposis/ decreased thoracic rotation 2/2 scoliosis in order to lift plates overhead cabinets/ decreased risk for    Time 10    Period Weeks    Status Achieved      PT LONG TERM GOAL  #10   TITLE Pt will demo increased height from 63" to > 63 1/2 " and maintain 63 1/2" across 3 weeks ( once a week measurement) and forward head posture improve ( decreased distance from wall to earlobe 17 cm from earlobe to wall to > 12 cm)  in order to be ready to progress to PNF exercise with less shoulder pain and lengthen/ strengthen concave curve on R 2/2 scoliosis to minimize L lateral thigh tightness  (07/06/19 : 63 3/4",  08/10/19: 64"   )    Time 8    Period Weeks    Status Achieved      PT LONG  TERM GOAL  #11   TITLE Pt will demo single UE support with lunge position exercises/ yoga poses  in order to demo improved balance and IND with flexibility routine    Baseline BUE support    Time 10    Period Weeks    Status On-going      PT LONG TERM GOAL  #12   TITLE Pt will demo L trunk rotation in R sidelying with zero report of pain in order to progress with longer walking endurance    Time 10    Period Weeks    Status Achieved      PT LONG TERM GOAL  #13   TITLE Pt will demo increased  shoulder abd R against wall in mini squat from 55 deg to > 75 deg in order to demo  increased thoracic extension and intercostal lengthening for functional BUE mobility without compensations.  ( 4/2: 88 deg R )    Time 8    Period Weeks    Status Achieved      PT LONG TERM GOAL  #14   TITLE Pt will demo less forward head posture ( earlobe to wall <13  cm) nd maintain height of 64  1/4" for 1 month in order to stand taller w/ less kyphosis and less deviations to spine to walk longer distances ( 11/20/19 : 10.5 cm)      Baseline ( earlobe to wall 16 cm)  ( 64  1/14" on 09/14/19  )     Time 10    Period Weeks    Status Achieved      PT LONG TERM GOAL  #15   TITLE Pt will demo no  ore perineal scar restrictions nor pelvic floor mm tensions and be able to  perform proper coordination of pelvic floor and contraction 3sec, 3 rep in order to minimize leakage.     Time 10    Status Revised                 Plan - 12/18/19 9323    Clinical Impression Statement Pt required manual Tx on R adductor and leg mm today. L adductor mm mobility showed good carry over from last session. Provided stretches for decrease tightness in L thigh.  Withheld from pelvic floor assessment today due to pt recovering from a sinus infection and still taking antibiotics. Withheld walking practice with hiking poles to help pt conserve her energy and rest to recover from her sinus infection. Plan to resume when pt feels better.  Pt continues to benefit form skilled PT    Personal Factors and Comorbidities Age;Comorbidity 3+    Comorbidities Osteoporosis, scoliosis, Arthritis, COPD, recent lumbosacral fusion for spondylolisthesis.    Examination-Activity Limitations Toileting;Sit;Stand;Bend;Lift;Carry    Examination-Participation Restrictions Interpersonal Relationship;Yard Work;Cleaning;Laundry    Stability/Clinical Decision Making Unstable/Unpredictable    Rehab Potential Good    PT Frequency 2x / week    PT Duration Other (comment)   20   PT Treatment/Interventions ADLs/Self Care Home Management;Aquatic Therapy;Moist Heat;Electrical Stimulation;Traction;Therapeutic activities;Functional mobility training;Stair training;Gait training;Therapeutic exercise;Balance training;Neuromuscular re-education;Patient/family education;Manual techniques;Dry needling;Passive range of motion;Scar mobilization;Taping    Consulted and Agree with Plan of Care Patient           Patient will benefit from skilled therapeutic intervention in order to improve the following deficits and impairments:  Abnormal gait, Decreased balance, Increased muscle spasms, Decreased range of motion, Decreased scar mobility, Improper body mechanics, Decreased coordination, Decreased strength, Increased fascial restricitons, Impaired flexibility, Postural dysfunction, Pain  Visit Diagnosis: Chronic right shoulder pain  Other muscle spasm  Abnormal posture  Other idiopathic scoliosis, thoracolumbar region     Problem List Patient Active Problem List   Diagnosis Date Noted  . Family history of breast cancer 11/16/2019  . Chronic UTI 12/06/2018  . Status post lumbar surgery 12/06/2018  . Spondylolisthesis, lumbar region 09/26/2018  . Aortic atherosclerosis ([place]) 08/31/2018  . CAD (coronary artery disease) 02/23/2018  . Advanced care planning/counseling discussion 03/28/2017  . Varicose veins of both lower extremities with complications  55/73/2202  . Arthritis 12/09/2016  . Vaginal atrophy 09/10/2015  . Vasomotor symptoms due to menopause 09/10/2015  . Onychomycosis due to dermatophyte 04/07/2015  . Bursitis of right shoulder 02/26/2015  . Environmental and seasonal allergies 02/26/2015  . Other allergic rhinitis 02/26/2015  .  Osteoporosis 11/26/2014  . COPD (chronic obstructive pulmonary disease) ([place]) 11/26/2014  . Fuchs' corneal dystrophy 11/26/2014  . Benign hypertensive renal disease 11/26/2014  . Hyperlipidemia 11/26/2014  . Chronic kidney disease, stage III (moderate) ([place]) 11/26/2014  . Hypothyroidism 11/26/2014  . Endothelial corneal dystrophy 11/26/2014    Jerl Mina ,PT, DPT, E-RYT  12/18/2019, 10:00 AM  [place] MAIN [facility] SERVICES [address] [place], [place], [address] Phone: [phone]   Fax:  [phone]  Name: Julia Deleon MRN: [id number] Date of Birth: [birth year]

## 2019-12-18 NOTE — Patient Instructions (Signed)
stretch with strap for hamstring  Heel on floor    Standing facing rail on step, L leg hangs and swings forward and back 20 reps

## 2019-12-20 ENCOUNTER — Other Ambulatory Visit: Payer: Self-pay | Admitting: Family Medicine

## 2019-12-20 NOTE — Telephone Encounter (Signed)
Patient last seen 09/27/19 and has appointment 04/07/20

## 2019-12-20 NOTE — Telephone Encounter (Signed)
Requested medication (s) are due for refill today: yes  Requested medication (s) are on the active medication list:  yes  Last refill:  12/03/2019  Future visit scheduled: no  Notes to clinic:  this refill cannot be delegated    Requested Prescriptions  Pending Prescriptions Disp Refills   chlorzoxazone (PARAFON) 500 MG tablet [Pharmacy Med Name: CHLORZOXAZONE 500 MG TAB] 30 tablet     Sig: TAKE ONE TABLET FOUR TIMES DAILY AS NEEDED FOR MUSCLE SPASM      Not Delegated - Analgesics:  Muscle Relaxants Failed - 12/20/2019 10:57 AM      Failed - This refill cannot be delegated      Passed - Valid encounter within last 6 months    Recent Outpatient Visits           2 months ago Other specified hypothyroidism   [place], [place], DO   7 months ago Acute lower UTI   [facility] [place], [place]   8 months ago Recurrent UTI   [place], Megan P, DO   8 months ago Medicare annual wellness visit, subsequent   [employer], [place], DO   1 year ago Chronic UTI   [facility] [place], Jeannette How, MD       Future Appointments             In 3 weeks Brendolyn Patty, MD [place]   In 3 months  [facility], [place]   In 3 months Wynetta Emery, Barb Merino, DO [employer], [facility]

## 2019-12-21 ENCOUNTER — Ambulatory Visit: Payer: Medicare Other | Admitting: Physical Therapy

## 2019-12-21 ENCOUNTER — Other Ambulatory Visit: Payer: Self-pay

## 2019-12-21 DIAGNOSIS — G8929 Other chronic pain: Secondary | ICD-10-CM

## 2019-12-21 DIAGNOSIS — M62838 Other muscle spasm: Secondary | ICD-10-CM | POA: Diagnosis not present

## 2019-12-21 DIAGNOSIS — M25511 Pain in right shoulder: Secondary | ICD-10-CM | POA: Diagnosis not present

## 2019-12-21 DIAGNOSIS — R293 Abnormal posture: Secondary | ICD-10-CM

## 2019-12-21 DIAGNOSIS — M533 Sacrococcygeal disorders, not elsewhere classified: Secondary | ICD-10-CM | POA: Diagnosis not present

## 2019-12-21 DIAGNOSIS — M4125 Other idiopathic scoliosis, thoracolumbar region: Secondary | ICD-10-CM | POA: Diagnosis not present

## 2019-12-21 NOTE — Patient Instructions (Signed)
Do not use the elliptical nor the leg swinging exercise on step   __  Stretch prior and after walking with hiking poles

## 2019-12-21 NOTE — Therapy (Signed)
[place] MAIN [facility] SERVICES [place], [place], [address] Phone: [phone]   Fax:  [phone]  Physical Therapy Treatment  Patient Details  Name: Julia Deleon MRN: [id number] Date of Birth: Jul 22, 1945 No data recorded  Encounter Date: 12/21/2019   PT End of Session - 12/21/19 0954    Visit Number 105    Date for PT Re-Evaluation 01/29/20   Progress note to report  (last on #97  11/20/19 )   Authorization Type Medicare    PT Start Time 0903    PT Stop Time 1000    PT Time Calculation (min) 57 min    Activity Tolerance Patient tolerated treatment well;No increased pain    Behavior During Therapy WFL for tasks assessed/performed           Past Medical History:  Diagnosis Date  . Arthritis   . COPD (chronic obstructive pulmonary disease) ([place])   . Family history of adverse reaction to anesthesia    father was slow to wake up  . Fuch's endothelial dystrophy   . Hyperlipidemia   . Hypothyroidism   . PONV (postoperative nausea and vomiting)    slow to wake up and PONV    Past Surgical History:  Procedure Laterality Date  . APPENDECTOMY    . BACK SURGERY  09/26/2018   L4-5 PLIF by Dr. Arnoldo Morale  . CARDIAC CATHETERIZATION    . CHOLECYSTECTOMY    . EYE SURGERY    . FOOT SURGERY Right    pin removed left  . laser vein surgery    . NASAL SINUS SURGERY    . RIGHT/LEFT HEART CATH AND CORONARY ANGIOGRAPHY N/A 12/06/2017   Procedure: RIGHT/LEFT HEART CATH AND CORONARY ANGIOGRAPHY;  Surgeon: Yolonda Kida, MD;  Location: [place] [facility];  Service: Cardiovascular;  Laterality: N/A;  . TONSILLECTOMY      There were no vitals filed for this visit.   Subjective Assessment - 12/21/19 0938    Subjective Pt reported she woke up with sciatic to below knee . This is has not happened in a long time and she thinks she slept wrong. Pt has been walking with hiking poles at home and stretching only after but not prior.                [facility] PT Assessment - 12/21/19 0953      Palpation   SI assessment  L ASIS more posterior     Palpation comment adductor/ IT band , glut med L tightness                          [facility] Adult PT Treatment/Exercise - 12/21/19 0939      Therapeutic Activites    Other Therapeutic Activities recommended to withhold from elliptical and swinging leg on step HEP       Moist Heat Therapy   Number Minutes Moist Heat 5 Minutes    Moist Heat Location --   L thigh       Manual Therapy   Manual therapy comments STM/MWM adductor/ IT band , quad, glut med L, long axis distraction                     PT Short Term Goals - 06/08/19 1133      PT SHORT TERM GOAL #1   Title Patient will demonstrate improved pelvic alignment and balance of musculature surrounding the pelvis to facilitate  decreased PFM spasms and decrease pelvic pain.    Baseline Pt. demonstrates severe posterior pelvic tilt, L thoracic/R lumbar scoliosis, and R anterior/L posterior pelvic obliquity. Pt. has demonstrated improved pelvic alignment and posture but has not been able to sustain long-term due to continued lack of postural strength.    Time 5    Period Weeks    Status Achieved    Target Date 03/06/19      PT SHORT TERM GOAL #2   Title Patient will demonstrate a coordinated contraction, relaxation, and bulge of the pelvic floor muscles to demonstrate functional recruitment and motion and allow for bladder emptying.    Baseline Pt. is unable to fully empty bladder and describes pain with catheterization, demonstrating high tone of PFM.    Time 5    Period Weeks    Status Achieved    Target Date 03/06/19      PT SHORT TERM GOAL #3   Title Patient will demonstrate improved sitting and standing posture to demonstrate learning and decrease stress on the pelvic floor with functional activity.    Baseline Pt. demonstrates severe posterior pelvic tilt, L thoracic/R lumbar scoliosis, and R  anterior/L posterior pelvic obliquity. 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 Will be able to cath without pain and will be cleared by physiscian to d/c self-cath due to decreased residual volume with bladder emptying.    Baseline required to cath 3 times per day, painful each time.    Time 10    Period Weeks    Status Achieved      PT LONG TERM GOAL #4   Title Patient will score less than or equal to 40% on the Female NIH-CPSI and 30% on the [facility] to demonstrate a reduction in pain, urinary symptoms, and an improved quality of life.    Baseline Female NIH-CPSI: 38/43 (88%) , PDI: 45/75 (60%); PDI 44/75 (59%) and Female NIH-CPSI: 27/43 (64%) on 02-08-19 , Female NIH-CPSI: 17% on 03-26-19    Time 10    Period Weeks    Status Achieved      PT LONG TERM GOAL #5   Title Patient will demonstrate increased step  length with reciprical arm swing, and demonstrate no scissoring steps.    Baseline Pt currently demonstrated scissoring steppage on R LE, increased trunk rotation and deep core  engagement with reciprical walking poles.    Time 10    Period Weeks    Status Achieved      PT LONG TERM GOAL #6   Title Pt will increase her distance on the 6MWT from 1079  feet  to  > 1320 feet  order to progress with aerobic exercises and walking longer distances in the community  ( 5/10: 1000 ft,  8/3: )     Time 8    Period Weeks    Status Revised      PT LONG TERM GOAL #7   Title Pt will report waking up > 2 mornings in a row with pain level < 5-6/10 in order to demo maintanence of Tx benefits and more balance of mm tensions achieved with HEP, shoe lift to improve QOL    Baseline 7-8/10 level and benefits from Tx lasted only one morning  (03-19-19 and 03-23-19)    Time 10    Period Weeks    Status Achieved      PT LONG TERM GOAL #8   Title Pt will be able to demo increased PF strength with unilateral UE support on wall from B 3/5 15 reps in order to achieve stronger push off in gait and walk longer distance    Baseline B, L 7 reps  2/5, 10 reps R 3/5  ( 12/17: 15 reps R , 13 reps L, 06/01/19: R 20 reps, L 18 reps  )    Time 10    Period Weeks    Status Achieved      PT LONG TERM GOAL  #9   TITLE Pt will demo shoulder abduction > 120 deg B with improved alignment of thorax over pelvis without manual cues in order to demo proper co-activation of B equal weight bearing in BLE, and decreased thoracic kyhposis/ decreased thoracic rotation 2/2 scoliosis in order to lift plates overhead cabinets/ decreased risk for    Time 10    Period Weeks    Status Achieved      PT LONG TERM GOAL  #10   TITLE Pt will demo increased height from 63" to > 63 1/2 " and maintain 63 1/2" across 3 weeks ( once a week measurement) and forward head posture improve ( decreased distance from wall to earlobe 17 cm from earlobe to wall to > 12 cm)  in order to be ready to progress to PNF exercise with less shoulder pain and lengthen/ strengthen concave curve on R 2/2 scoliosis to minimize L lateral thigh  tightness  (07/06/19 : 63 3/4",  08/10/19: 64"   )    Time 8    Period Weeks    Status Achieved      PT LONG TERM GOAL  #11   TITLE Pt will demo single UE support with lunge position exercises/ yoga poses  in order to demo improved balance and IND with flexibility routine    Baseline BUE support    Time 10    Period Weeks    Status On-going      PT LONG TERM GOAL  #12   TITLE Pt will demo L trunk rotation in R sidelying with zero report of pain in order to progress with longer walking endurance    Time 10    Period Weeks  Status Achieved      PT LONG TERM GOAL  #13   TITLE Pt will demo increased  shoulder abd R against wall in mini squat from 55 deg to > 75 deg in order to demo increased thoracic extension and intercostal lengthening for functional BUE mobility without compensations.  ( 4/2: 88 deg R )    Time 8    Period Weeks    Status Achieved      PT LONG TERM GOAL  #14   TITLE Pt will demo less forward head posture ( earlobe to wall <13  cm) nd maintain height of 64  1/4" for 1 month in order to stand taller w/ less kyphosis and less deviations to spine to walk longer distances ( 11/20/19 : 10.5 cm)      Baseline ( earlobe to wall 16 cm)  ( 64  1/14" on 09/14/19  )     Time 10    Period Weeks    Status Achieved      PT LONG TERM GOAL  #15   TITLE Pt will demo no  ore perineal scar restrictions nor pelvic floor mm tensions and be able to  perform proper coordination of pelvic floor and contraction 3sec, 3 rep in order to minimize leakage.     Time 10    Status Revised                 Plan - 12/21/19 0954    Clinical Impression Statement Pt responded positively to today's session with decreased radiating pain to knee by 50%. Pt's IT band, adductor, quad, glut med on LLE were tight and demo'd more pliability post Tx. Pt was advised to not use the elliptical and withhold on the step swining leg exercise from last week. Advised pt to stretch before and after walking at home  with hiking poles. This method will serve best for pt given her scoliosis and fitness machines for walking are not as advantageous for pt's scoliosis. Resume endurance walking training and pelvic floor Tx at upcoming sessions. PT continues to benefit from skilled PT    Personal Factors and Comorbidities Age;Comorbidity 3+    Comorbidities Osteoporosis, scoliosis, Arthritis, COPD, recent lumbosacral fusion for spondylolisthesis.    Examination-Activity Limitations Toileting;Sit;Stand;Bend;Lift;Carry    Examination-Participation Restrictions Interpersonal Relationship;Yard Work;Cleaning;Laundry    Stability/Clinical Decision Making Unstable/Unpredictable    Rehab Potential Good    PT Frequency 2x / week    PT Duration Other (comment)   20   PT Treatment/Interventions ADLs/Self Care Home Management;Aquatic Therapy;Moist Heat;Electrical Stimulation;Traction;Therapeutic activities;Functional mobility training;Stair training;Gait training;Therapeutic exercise;Balance training;Neuromuscular re-education;Patient/family education;Manual techniques;Dry needling;Passive range of motion;Scar mobilization;Taping    Consulted and Agree with Plan of Care Patient           Patient will benefit from skilled therapeutic intervention in order to improve the following deficits and impairments:  Abnormal gait, Decreased balance, Increased muscle spasms, Decreased range of motion, Decreased scar mobility, Improper body mechanics, Decreased coordination, Decreased strength, Increased fascial restricitons, Impaired flexibility, Postural dysfunction, Pain  Visit Diagnosis: Other muscle spasm  Chronic right shoulder pain  Abnormal posture  Other idiopathic scoliosis, thoracolumbar region     Problem List Patient Active Problem List   Diagnosis Date Noted  . Family history of breast cancer 11/16/2019  . Chronic UTI 12/06/2018  . Status post lumbar surgery 12/06/2018  . Spondylolisthesis, lumbar region  09/26/2018  . Aortic atherosclerosis ([place]) 08/31/2018  . CAD (coronary artery disease) 02/23/2018  . Advanced care  planning/counseling discussion 03/28/2017  . Varicose veins of both lower extremities with complications 63/87/5643  . Arthritis 12/09/2016  . Vaginal atrophy 09/10/2015  . Vasomotor symptoms due to menopause 09/10/2015  . Onychomycosis due to dermatophyte 04/07/2015  . Bursitis of right shoulder 02/26/2015  . Environmental and seasonal allergies 02/26/2015  . Other allergic rhinitis 02/26/2015  . Osteoporosis 11/26/2014  . COPD (chronic obstructive pulmonary disease) ([place]) 11/26/2014  . Fuchs' corneal dystrophy 11/26/2014  . Benign hypertensive renal disease 11/26/2014  . Hyperlipidemia 11/26/2014  . Chronic kidney disease, stage III (moderate) ([place]) 11/26/2014  . Hypothyroidism 11/26/2014  . Endothelial corneal dystrophy 11/26/2014    Jerl Mina ,PT, DPT, E-RYT  12/21/2019, 9:56 AM  [place] MAIN [facility] SERVICES [address] [place], [place], [address] Phone: [phone]   Fax:  [phone]  Name: Julia Deleon MRN: [id number] Date of Birth: 08-Dec-1945

## 2019-12-24 ENCOUNTER — Other Ambulatory Visit: Payer: Self-pay

## 2019-12-24 ENCOUNTER — Encounter: Payer: Self-pay | Admitting: Obstetrics and Gynecology

## 2019-12-24 ENCOUNTER — Ambulatory Visit (INDEPENDENT_AMBULATORY_CARE_PROVIDER_SITE_OTHER): Payer: Medicare Other | Admitting: Obstetrics and Gynecology

## 2019-12-24 VITALS — BP 144/70 | Ht 64.0 in | Wt 161.0 lb

## 2019-12-24 DIAGNOSIS — N898 Other specified noninflammatory disorders of vagina: Secondary | ICD-10-CM | POA: Diagnosis not present

## 2019-12-24 MED ORDER — CLOTRIMAZOLE-BETAMETHASONE 1-0.05 % EX CREA: TOPICAL_CREAM | CUTANEOUS | 0 refills | Status: DC

## 2019-12-24 NOTE — Patient Instructions (Signed)
I value your feedback and entrusting us with your care. If you get a [facility] patient survey, I would appreciate you taking the time to let us know about your experience today. Thank you!  As of April 19, 2019, your lab results will be released to your MyChart immediately, before I even have a chance to see them. Please give me time to review them and contact you if there are any abnormalities. Thank you for your patience.  

## 2019-12-24 NOTE — Progress Notes (Signed)
[name], DO   Chief Complaint  Patient presents with  . Vaginal Irritation    little itching, not sure of odor x couple of weeks    HPI:      [name] is a 74 y.o. [id number] whose LMP was No LMP recorded (lmp unknown). Patient is postmenopausal., presents today for vaginal itching for a few wks without increased d/c or odor. Is currently on abx for sinusitis but sx started before abx use. No urin sx, no new soaps, detergents. No meds to treat. Is doing pelvic PT and wonders if she was exposed to something, so wants to be checked. Uses dove sens skin soap, no dryer sheets, no wipes, cotton underwear only. Is still on progesterone but stopped vag ERT a couple wks ago.    Past Medical History:  Diagnosis Date  . Arthritis   . COPD (chronic obstructive pulmonary disease) ([place])   . Family history of adverse reaction to anesthesia    father was slow to wake up  . Fuch's endothelial dystrophy   . Hyperlipidemia   . Hypothyroidism   . PONV (postoperative nausea and vomiting)    slow to wake up and PONV    Past Surgical History:  Procedure Laterality Date  . APPENDECTOMY    . BACK SURGERY  09/26/2018   L4-5 PLIF by Dr. Arnoldo Morale  . CARDIAC CATHETERIZATION    . CHOLECYSTECTOMY    . EYE SURGERY    . FOOT SURGERY Right    pin removed left  . laser vein surgery    . NASAL SINUS SURGERY    . RIGHT/LEFT HEART CATH AND CORONARY ANGIOGRAPHY N/A 12/06/2017   Procedure: RIGHT/LEFT HEART CATH AND CORONARY ANGIOGRAPHY;  Surgeon: Yolonda Kida, MD;  Location: [place] [facility];  Service: Cardiovascular;  Laterality: N/A;  . TONSILLECTOMY      Family History  Problem Relation Age of Onset  . Cancer Mother        lung  . CAD Mother   . Stroke Father   . Breast cancer Sister        36s  . Varicose Veins Son   . Heart disease Maternal Grandmother   . Cancer Paternal Grandmother        throat  . Emphysema Paternal Grandfather   . Fibromyalgia Sister   . Brain  cancer Sister   . Breast cancer Other     Social History   Socioeconomic History  . Marital status: Married    Spouse name: Not on file  . Number of children: Not on file  . Years of education: 4  . Highest education level: Bachelor's degree (e.g., BA, AB, BS)  Occupational History  . Not on file  Tobacco Use  . Smoking status: Never Smoker  . Smokeless tobacco: Never Used  Vaping Use  . Vaping Use: Never used  Substance and Sexual Activity  . Alcohol use: No    Alcohol/week: 0.0 standard drinks  . Drug use: No  . Sexual activity: Not Currently    Birth control/protection: Post-menopausal  Other Topics Concern  . Not on file  Social History Narrative  . Not on file   Social Determinants of Health   Financial Resource Strain:   . Difficulty of Paying Living Expenses:   Food Insecurity:   . Worried About Charity fundraiser in the Last Year:   . Arboriculturist in the Last Year:   Transportation Needs:   .  Lack of Transportation (Medical):   Marland Kitchen Lack of Transportation (Non-Medical):   Physical Activity:   . Days of Exercise per Week:   . Minutes of Exercise per Session:   Stress:   . Feeling of Stress :   Social Connections:   . Frequency of Communication with Friends and Family:   . Frequency of Social Gatherings with Friends and Family:   . Attends Religious Services:   . Active Member of Clubs or Organizations:   . Attends Archivist Meetings:   Marland Kitchen Marital Status:   Intimate Partner Violence:   . Fear of Current or Ex-Partner:   . Emotionally Abused:   Marland Kitchen Physically Abused:   . Sexually Abused:     Outpatient Medications Prior to Visit  Medication Sig Dispense Refill  . acetaminophen (TYLENOL) 650 MG CR tablet Take 650-1,300 mg by mouth every 8 (eight) hours as needed for pain.     . Alpha-Lipoic Acid 100 MG TABS Take 100 mg by mouth daily with lunch.    Marland Kitchen azelastine (ASTELIN) 0.1 % nasal spray Place 1 spray into both nostrils daily. Use in each  nostril as directed    . chlorzoxazone (PARAFON) 500 MG tablet Take 0.5 tablets (250 mg total) by mouth 3 (three) times daily as needed for muscle spasms. 90 tablet 1  . Cholecalciferol (VITAMIN D3) 2000 units TABS Take 2,000 Units by mouth daily.     . Coenzyme Q10 (COQ-10) 100 MG CAPS Take 100 mg by mouth daily.    Marland Kitchen CRANBERRY PO Take 1 capsule by mouth daily.    . Cyanocobalamin (B-12-SL) 1000 MCG SUBL Place 500 mcg under the tongue 2 (two) times a day.    . D-MANNOSE PO Take by mouth.    Marland Kitchen DHEA 10 MG CAPS Take 15 mg by mouth daily.     Marland Kitchen EPINEPHrine 0.3 mg/0.3 mL IJ SOAJ injection     . Estriol POWD INSERT 0.5GM 2-3 TIMES WEEKLY AS NEEDED 50 g 0  . fexofenadine (ALLEGRA) 60 MG tablet Take 60 mg by mouth daily.    Marland Kitchen glucosamine-chondroitin 500-400 MG tablet Take 1 tablet by mouth 3 (three) times daily.    Marland Kitchen levothyroxine (SYNTHROID) 75 MCG tablet TAKE ONE TABLET EACH MORNING BEFORE BREAKFAST 90 tablet 3  . losartan (COZAAR) 100 MG tablet Take 1 tablet (100 mg total) by mouth daily. 90 tablet 1  . Multiple Vitamin (MULTIVITAMIN WITH MINERALS) TABS tablet Take 1 tablet by mouth every evening.    . Multiple Vitamins-Minerals (LUTEIN-ZEAXANTHIN PO) Take 1 tablet by mouth at bedtime.    . Probiotic CAPS Take 1 capsule by mouth at bedtime.    . Progesterone Micronized (PROGESTERONE, BULK,) POWD Apply 1 application topically See admin instructions. 4% compound- topically 100 g 11  . triamcinolone (NASACORT ALLERGY 24HR) 55 MCG/ACT AERO nasal inhaler Place 2 sprays daily into the nose. 1 Inhaler 12  . TURMERIC PO Take 1 tablet by mouth 2 (two) times a week.     No facility-administered medications prior to visit.      ROS:  Review of Systems  Constitutional: Negative for fever.  Gastrointestinal: Negative for blood in stool, constipation, diarrhea, nausea and vomiting.  Genitourinary: Negative for dyspareunia, dysuria, flank pain, frequency, hematuria, urgency, vaginal bleeding, vaginal  discharge and vaginal pain.  Musculoskeletal: Negative for back pain.  Skin: Negative for rash.   BREAST: No symptoms   OBJECTIVE:   Vitals:  BP (!) 144/70   Ht 5\' 4"  (1.626  m)   Wt 161 lb (73 kg)   LMP  (LMP Unknown)   BMI 27.64 kg/m   Physical Exam Vitals reviewed.  Constitutional:      Appearance: She is well-developed.  Pulmonary:     Effort: Pulmonary effort is normal.  Genitourinary:    General: Normal vulva.     Pubic Area: No rash.      Labia:        Right: No rash, tenderness or lesion.        Left: No rash, tenderness or lesion.      Vagina: Normal. No vaginal discharge, erythema or tenderness.     Cervix: Normal.     Uterus: Normal. Not enlarged and not tender.      Adnexa: Right adnexa normal and left adnexa normal.       Right: No mass or tenderness.         Left: No mass or tenderness.      Musculoskeletal:        General: Normal range of motion.     Cervical back: Normal range of motion.  Skin:    General: Skin is warm and dry.  Neurological:     General: No focal deficit present.     Mental Status: She is alert and oriented to person, place, and time.  Psychiatric:        Mood and Affect: Mood normal.        Behavior: Behavior normal.        Thought Content: Thought content normal.        Judgment: Judgment normal.     Results: Results for orders placed or performed in visit on 12/24/19 (from the past 24 hour(s))  POCT Wet Prep with KOH     Status: Normal   Collection Time: 12/25/19  9:15 AM  Result Value Ref Range   Trichomonas, UA Negative    Clue Cells Wet Prep HPF POC neg    Epithelial Wet Prep HPF POC     Yeast Wet Prep HPF POC neg    Bacteria Wet Prep HPF POC     RBC Wet Prep HPF POC     WBC Wet Prep HPF POC     KOH Prep POC Negative Negative     Assessment/Plan: Vaginal itching - Plan: clotrimazole-betamethasone (LOTRISONE) cream, POCT Wet Prep with KOH; Neg wet prep, pos sx and exam. Question ext fungal vs chem. Rx  lotrisone crm. F/u prn. Reassurance that it's not related to any exposure during pelvic PT.   Meds ordered this encounter  Medications  . clotrimazole-betamethasone (LOTRISONE) cream    Sig: Apply externally BID prn sx up to 2 wks    Dispense:  15 g    Refill:  0    Order Specific Question:   Supervising Provider    Answer:   Gae Dry [[id number]]      Return if symptoms worsen or fail to improve.  Dina [place] B. Bedie Dominey, PA-C 12/25/2019 9:17 AM

## 2019-12-25 ENCOUNTER — Ambulatory Visit: Payer: Medicare Other | Admitting: Physical Therapy

## 2019-12-25 ENCOUNTER — Other Ambulatory Visit: Payer: Self-pay | Admitting: Family Medicine

## 2019-12-25 ENCOUNTER — Ambulatory Visit: Payer: Medicare Other | Admitting: Dermatology

## 2019-12-25 ENCOUNTER — Encounter: Payer: Self-pay | Admitting: Obstetrics and Gynecology

## 2019-12-25 DIAGNOSIS — M4125 Other idiopathic scoliosis, thoracolumbar region: Secondary | ICD-10-CM | POA: Diagnosis not present

## 2019-12-25 DIAGNOSIS — M25511 Pain in right shoulder: Secondary | ICD-10-CM | POA: Diagnosis not present

## 2019-12-25 DIAGNOSIS — R293 Abnormal posture: Secondary | ICD-10-CM

## 2019-12-25 DIAGNOSIS — M533 Sacrococcygeal disorders, not elsewhere classified: Secondary | ICD-10-CM | POA: Diagnosis not present

## 2019-12-25 DIAGNOSIS — M62838 Other muscle spasm: Secondary | ICD-10-CM

## 2019-12-25 DIAGNOSIS — G8929 Other chronic pain: Secondary | ICD-10-CM | POA: Diagnosis not present

## 2019-12-25 LAB — POCT WET PREP WITH KOH
Clue Cells Wet Prep HPF POC: NEGATIVE
KOH Prep POC: NEGATIVE
Trichomonas, UA: NEGATIVE
Yeast Wet Prep HPF POC: NEGATIVE

## 2019-12-25 NOTE — Telephone Encounter (Signed)
Requested medication (s) are due for refill today: Yes  Requested medication (s) are on the active medication list: Yes  Last refill:  06/22/19  Future visit scheduled: Yes  Notes to clinic:  See request.    Requested Prescriptions  Pending Prescriptions Disp Refills   chlorzoxazone (PARAFON) 500 MG tablet 90 tablet 1    Sig: Take 0.5 tablets (250 mg total) by mouth 3 (three) times daily as needed for muscle spasms.      Not Delegated - Analgesics:  Muscle Relaxants Failed - 12/25/2019 10:24 AM      Failed - This refill cannot be delegated      Passed - Valid encounter within last 6 months    Recent Outpatient Visits           2 months ago Other specified hypothyroidism   [place], [place], DO   7 months ago Acute lower UTI   [place], [place], [place]   8 months ago Recurrent UTI   [place], Megan P, DO   8 months ago Medicare annual wellness visit, subsequent   [employer], [place], DO   1 year ago Chronic UTI   [facility] Gu[place]upe Maple, MD       Future Appointments             In 3 weeks Brendolyn Patty, MD [place]   In 3 months  [facility] [place]   In 3 months Wynetta Emery, Barb Merino, DO [employer]

## 2019-12-25 NOTE — Telephone Encounter (Signed)
Patient wanted to note that she is requesting 90 day supply.

## 2019-12-25 NOTE — Telephone Encounter (Signed)
PT need a refill chlorzoxazone (PARAFON) 500 MG tablet [[id number]]  [facility] - [place], [place] - [address]  [place] [address]  Phone: [phone] Fax: [phone]

## 2019-12-25 NOTE — Addendum Note (Signed)
Addended by: Jefferson Fuel on: 12/25/2019 10:24 AM   Modules accepted: Orders

## 2019-12-25 NOTE — Therapy (Signed)
[place] MA[facility] SERVICES [place], [place], [address] Phone: [phone]   Fax:  [phone]  Physical Therapy Treatment  Patient Details  Name: Julia Deleon MRN: [id number] Date of Birth: [birth year] No data recorded  Encounter Date: 12/25/2019   PT End of Session - 12/25/19 0953    Visit Number [id number]    Date for PT Re-Evaluation 01/29/20   Progress note to report  (last on #97  11/20/19 )   Authorization Type Medicare    PT Start Time 0901    PT Stop Time 1000    PT Time Calculation (min) 59 min    Activity Tolerance Patient tolerated treatment well;No increased pain    Behavior During Therapy WFL for tasks assessed/performed           Past Medical History:  Diagnosis Date  . Arthritis   . COPD (chronic obstructive pulmonary disease) ([place])   . Family history of adverse reaction to anesthesia    father was slow to wake up  . Fuch's endothelial dystrophy   . Hyperlipidemia   . Hypothyroidism   . PONV (postoperative nausea and vomiting)    slow to wake up and PONV    Past Surgical History:  Procedure Laterality Date  . APPENDECTOMY    . BACK SURGERY  09/26/2018   L4-5 PLIF by Dr. Arnoldo Morale  . CARDIAC CATHETERIZATION    . CHOLECYSTECTOMY    . EYE SURGERY    . FOOT SURGERY Right    pin removed left  . laser vein surgery    . NASAL SINUS SURGERY    . RIGHT/LEFT HEART CATH AND CORONARY ANGIOGRAPHY N/A 12/06/2017   Procedure: RIGHT/LEFT HEART CATH AND CORONARY ANGIOGRAPHY;  Surgeon: Yolonda Kida, MD;  Location: [facility];  Service: Cardiovascular;  Laterality: N/A;  . TONSILLECTOMY      There were no vitals filed for this visit.   Subjective Assessment - 12/25/19 0910    Subjective Pt reported she felt good by the evening after last session. "Feeling good" means to her "Alot of flexibility" and "felt mellow". She did not notice tightness nor shooting pain.              [facility] PT Assessment -  12/25/19 0954      Coordination   Gross Motor Movements are Fluid and Coordinated --   IND with stretches     Palpation   Palpation comment no tightness along adductor, ITband       Ambulation/Gait   Gait Comments longer stride, more hip flexion, stronger push off                         [facility] Adult PT Treatment/Exercise - 12/25/19 0954      Neuro Re-ed    Neuro Re-ed Details  cued for new stretches with straps for hip mobility and to add to previous stretches                     PT Short Term Goals - 06/08/19 1133      PT SHORT TERM GOAL #1   Title Patient will demonstrate improved pelvic alignment and balance of musculature surrounding the pelvis to facilitate decreased PFM spasms and decrease pelvic pain.    Baseline Pt. demonstrates severe posterior pelvic tilt, L thoracic/R lumbar scoliosis, and R anterior/L posterior pelvic obliquity. Pt. has demonstrated improved pelvic alignment and posture but has  not been able to sustain long-term due to continued lack of postural strength.    Time 5    Period Weeks    Status Achieved    Target Date 03/06/19      PT SHORT TERM GOAL #2   Title Patient will demonstrate a coordinated contraction, relaxation, and bulge of the pelvic floor muscles to demonstrate functional recruitment and motion and allow for bladder emptying.    Baseline Pt. is unable to fully empty bladder and describes pain with catheterization, demonstrating high tone of PFM.    Time 5    Period Weeks    Status Achieved    Target Date 03/06/19      PT SHORT TERM GOAL #3   Title Patient will demonstrate improved sitting and standing posture to demonstrate learning and decrease stress on the pelvic floor with functional activity.    Baseline Pt. demonstrates severe posterior pelvic tilt, L thoracic/R lumbar scoliosis, and R anterior/L posterior pelvic obliquity. 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 Will be able to cath without pain and will be cleared by physiscian to d/c self-cath due to decreased residual volume with bladder emptying.    Baseline required to cath 3 times per day, painful each time.    Time 10    Period Weeks    Status Achieved      PT LONG TERM GOAL #4   Title Patient will score less than or equal to 40% on the Female NIH-CPSI and 30% on the [facility] to demonstrate a reduction in pain, urinary symptoms, and an improved quality of life.    Baseline Female NIH-CPSI: 38/43 (88%) , PDI: 45/75 (60%); PDI 44/75 (59%) and Female NIH-CPSI: 27/43 (64%) on 02-08-19 , Female NIH-CPSI: 17% on 03-26-19    Time 10    Period Weeks    Status Achieved      PT LONG TERM GOAL #5   Title Patient will demonstrate increased step length with reciprical arm swing, and demonstrate no scissoring steps.    Baseline Pt currently demonstrated scissoring steppage on R LE, increased trunk rotation and deep core engagement with reciprical walking poles.    Time  10    Period Weeks    Status Achieved      PT LONG TERM GOAL #6     Title Pt will increase her distance on the 6MWT from 1079  feet  to  > 1320 feet  order to progress with aerobic exercises and walking longer distances in the community  ( 5/10: 1000 ft,  8/3: )     Time 8    Period Weeks    Status Revised      PT LONG TERM GOAL #7   Title Pt will report waking up > 2 mornings in a row with pain level < 5-6/10 in order to demo maintanence of Tx benefits and more balance of mm tensions achieved with HEP, shoe lift to improve QOL    Baseline 7-8/10 level and benefits from Tx lasted only one morning  (03-19-19 and 03-23-19)    Time 10    Period Weeks    Status Achieved      PT LONG TERM GOAL #8   Title Pt will be able to demo increased PF strength with unilateral UE support on wall from B 3/5 15 reps in order to achieve stronger push off in gait and walk longer distance    Baseline B, L 7 reps  2/5, 10 reps R 3/5  ( 12/17: 15 reps R , 13 reps L, 06/01/19: R 20 reps, L 18 reps  )    Time 10    Period Weeks    Status Achieved      PT LONG TERM GOAL  #9   TITLE Pt will demo shoulder abduction > 120 deg B with improved alignment of thorax over pelvis without manual cues in order to demo proper co-activation of B equal weight bearing in BLE, and decreased thoracic kyhposis/ decreased thoracic rotation 2/2 scoliosis in order to lift plates overhead cabinets/ decreased risk for    Time 10    Period Weeks    Status Achieved      PT LONG TERM GOAL  #10   TITLE Pt will demo increased height from 63" to > 63 1/2 " and maintain 63 1/2" across 3 weeks ( once a week measurement) and forward head posture improve ( decreased distance from wall to earlobe 17 cm from earlobe to wall to > 12 cm)  in order to be ready to progress to PNF exercise with less shoulder pain and lengthen/ strengthen concave curve on R 2/2 scoliosis to minimize L lateral thigh tightness  (07/06/19 : 63 3/4",  08/10/19: 64"   )    Time 8    Period Weeks    Status Achieved      PT LONG TERM GOAL  #11    TITLE Pt will demo single UE support with lunge position exercises/ yoga poses  in order to demo improved balance and IND with flexibility routine    Baseline BUE support    Time 10    Period Weeks    Status On-going      PT LONG TERM GOAL  #12   TITLE Pt will demo L trunk rotation in R sidelying with zero report of pain in order to progress with longer walking endurance    Time 10    Period Weeks    Status Achieved      PT LONG TERM GOAL  #13   TITLE Pt will demo increased  shoulder abd R against wall in mini squat from 55 deg to > 75  deg in order to demo increased thoracic extension and intercostal lengthening for functional BUE mobility without compensations.  ( 4/2: 88 deg R )    Time 8    Period Weeks    Status Achieved      PT LONG TERM GOAL  #14   TITLE Pt will demo less forward head posture ( earlobe to wall <13  cm) nd maintain height of 64  1/4" for 1 month in order to stand taller w/ less kyphosis and less deviations to spine to walk longer distances ( 11/20/19 : 10.5 cm)      Baseline ( earlobe to wall 16 cm)  ( 64  1/14" on 09/14/19  )     Time 10    Period Weeks    Status Achieved      PT LONG TERM GOAL  #15   TITLE Pt will demo no  ore perineal scar restrictions nor pelvic floor mm tensions and be able to  perform proper coordination of pelvic floor and contraction 3sec, 3 rep in order to minimize leakage.     Time 10    Status Revised                 Plan - 12/25/19 0958    Clinical Impression Statement Pt responded positively to last session with manual Tx to release ITBand and adductor , glut med tightness on L after which she reported she really good with increased flexibility and did not notice tightness nor radiating pain for the evening. Progressed pt with stretches using strap for more CKC effect to lengthen intercostals which is important given pt's scoliosis. Pt demo' IND. Resume endurance walking training at next session./ Pt continues to benefit from  skilled PT   Personal Factors and Comorbidities Age;Comorbidity 3+    Comorbidities Osteoporosis, scoliosis, Arthritis, COPD, recent lumbosacral fusion for spondylolisthesis.    Examination-Activity Limitations Toileting;Sit;Stand;Bend;Lift;Carry    Examination-Participation Restrictions Interpersonal Relationship;Yard Work;Cleaning;Laundry    Stability/Clinical Decision Making Unstable/Unpredictable    Rehab Potential Good    PT Frequency 2x / week    PT Duration Other (comment)   20   PT Treatment/Interventions ADLs/Self Care Home Management;Aquatic Therapy;Moist Heat;Electrical Stimulation;Traction;Therapeutic activities;Functional mobility training;Stair training;Gait training;Therapeutic exercise;Balance training;Neuromuscular re-education;Patient/family education;Manual techniques;Dry needling;Passive range of motion;Scar mobilization;Taping    Consulted and Agree with Plan of Care Patient           Patient will benefit from skilled therapeutic intervention in order to improve the following deficits and impairments:  Abnormal gait, Decreased balance, Increased muscle spasms, Decreased range of motion, Decreased scar mobility, Improper body mechanics, Decreased coordination, Decreased strength, Increased fascial restricitons, Impaired flexibility, Postural dysfunction, Pain  Visit Diagnosis: Chronic right shoulder pain  Other muscle spasm  Abnormal posture  Other idiopathic scoliosis, thoracolumbar region     Problem List Patient Active Problem List   Diagnosis Date Noted  . Family history of breast cancer 11/16/2019  . Chronic UTI 12/06/2018  . Status post lumbar surgery 12/06/2018  . Spondylolisthesis, lumbar region 09/26/2018  . Aortic atherosclerosis ([place]) 08/31/2018  . CAD (coronary artery disease) 02/23/2018  . Advanced care planning/counseling discussion 03/28/2017  . Varicose veins of both lower extremities with complications 12/87/8676  . Arthritis 12/09/2016    . Vaginal atrophy 09/10/2015  . Vasomotor symptoms due to menopause 09/10/2015  . Onychomycosis due to dermatophyte 04/07/2015  . Bursitis of right shoulder 02/26/2015  . Environmental and seasonal allergies 02/26/2015  . Other allergic rhinitis 02/26/2015  . Osteoporosis  11/26/2014  . COPD (chronic obstructive pulmonary disease) ([place]) 11/26/2014  . Fuchs' corneal dystrophy 11/26/2014  . Benign hypertensive renal disease 11/26/2014  . Hyperlipidemia 11/26/2014  . Chronic kidney disease, stage III (moderate) ([place]) 11/26/2014  . Hypothyroidism 11/26/2014  . Endothelial corneal dystrophy 11/26/2014    Jerl Mina ,PT, DPT, E-RYT  12/25/2019, 10:46 AM  [place] MAIN [facility] SERVICES [address] [place], [place], [address] Phone: [phone]   Fax:  [phone]  Name: Julia Deleon MRN: [id number] Date of Birth: 03-17-1946

## 2019-12-25 NOTE — Patient Instructions (Signed)
Use of strap in past stretches to promote lengthening at side trunk   ________________________________________________________  Stretches for your legs: LAYING on Back Use upper arms and elbows for stability when pulling strap Opposite knee bent and foot firm in align with hip   Strap on [place]:  Hip socket  _strap, L knee bent, R [place] against strap and spread toes, rolling foot 15 deg out and in across midline.  10 reps each side   Hamstring _knee bends  10 reps  With knee pointing straight     10 reps with knee pointing out towards armpit    IT band _scoot hips to R, cross R leg over L and straighten knee with strap on [place],    ______________________________________________________

## 2019-12-26 ENCOUNTER — Telehealth: Payer: Self-pay | Admitting: Radiology

## 2019-12-26 NOTE — Telephone Encounter (Signed)
Patient states she feels a bulge in the vaginal area and thinks it might be her bladder dropping.

## 2019-12-26 NOTE — Telephone Encounter (Signed)
Patient wants return call. Would not say why. Phone number is [phone]. Needs to know if she needs an appt.

## 2019-12-27 NOTE — Telephone Encounter (Signed)
Medication Refill - Medication: Parafon 500mg  (90 day supply)  Has the patient contacted their pharmacy? Yes.   (Agent: If no, request that the patient contact the pharmacy for the refill.) (Agent: If yes, when and what did the pharmacy advise?)  Preferred Pharmacy (with phone number or street name): [place], [place] - [place]: Please be advised that RX refills may take up to 3 business days. We ask that you follow-up with your pharmacy.

## 2019-12-31 ENCOUNTER — Encounter: Payer: Self-pay | Admitting: Urology

## 2019-12-31 ENCOUNTER — Ambulatory Visit (INDEPENDENT_AMBULATORY_CARE_PROVIDER_SITE_OTHER): Payer: Medicare Other | Admitting: Urology

## 2019-12-31 ENCOUNTER — Other Ambulatory Visit: Payer: Self-pay

## 2019-12-31 VITALS — BP 157/65 | HR 66

## 2019-12-31 DIAGNOSIS — N8110 Cystocele, unspecified: Secondary | ICD-10-CM

## 2019-12-31 DIAGNOSIS — N8111 Cystocele, midline: Secondary | ICD-10-CM

## 2019-12-31 NOTE — Progress Notes (Signed)
12/31/2019 9:43 AM   Dalisa C Stanko February 28, 1946 [id number]  Referring provider: [name], DO [place],  [place] [address]  Chief Complaint  Patient presents with  . possible bladder prolapse    HPI: Dr Erlene Quan: Recurrent urinary tract infections with positive cultures.  She had urinary retention following laminectomy Main 2020.  Catheter volumes 50 cc 275 cc.  She finally stopped catheterizing.  Use estrogen and then other cream for vaginal atrophy by gynecology.  Had urodynamics at [facility] in September 2020.  Failed methenamine.  Today I was consulted to assess the patient's prolapse symptoms for the last week.  She feels a bulge in the vagina.  She does not reduce it.  She has not had a hysterectomy.  Since the prolapse she can have urge incontinence but does not wear a pad.  Several weeks ago she may have had a little bit her urgency leaking a few drops.  No stress incontinence.  No bedwetting  She voids 3 times an hour in the morning and then every 1 or 2 hours in the afternoon.  Normally no nocturia though this increased a little bit the last week.  Flow was good.  No other neurologic issues.  Bowel movements normal.  No previous bladder surgery    PMH: Past Medical History:  Diagnosis Date  . Arthritis   . COPD (chronic obstructive pulmonary disease) ([place])   . Family history of adverse reaction to anesthesia    father was slow to wake up  . Fuch's endothelial dystrophy   . Hyperlipidemia   . Hypothyroidism   . PONV (postoperative nausea and vomiting)    slow to wake up and PONV    Surgical History: Past Surgical History:  Procedure Laterality Date  . APPENDECTOMY    . BACK SURGERY  09/26/2018   L4-5 PLIF by Dr. Arnoldo Morale  . CARDIAC CATHETERIZATION    . CHOLECYSTECTOMY    . EYE SURGERY    . FOOT SURGERY Right    pin removed left  . laser vein surgery    . NASAL SINUS SURGERY    . RIGHT/LEFT HEART CATH AND CORONARY ANGIOGRAPHY N/A 12/06/2017   Procedure:  RIGHT/LEFT HEART CATH AND CORONARY ANGIOGRAPHY;  Surgeon: Yolonda Kida, MD;  Location: [facility];  Service: Cardiovascular;  Laterality: N/A;  . TONSILLECTOMY      Home Medications:  Allergies as of 12/31/2019      Reactions   Colesevelam Nausea And Vomiting   WELCHOL   Levofloxacin Hives   Benazepril Other (See Comments)   Methylisothiazolinone Other (See Comments)   Positive on allergy test   Thimerosal Other (See Comments)   Positive on allergy test   Amoxicillin-pot Clavulanate Nausea Only   Did it involve swelling of the face/tongue/throat, SOB, or low BP? No Did it involve sudden or severe rash/hives, skin peeling, or any reaction on the inside of your mouth or nose? No Did you need to seek medical attention at a hospital or doctor's office? Unknown When did it last happen?unknown If all above answers are "NO", may proceed with cephalosporin use.   Latex Rash   Family history of latex allergy   Simvastatin Itching      Medication List       Accurate as of December 31, 2019  9:43 AM. If you have any questions, ask your nurse or doctor.        acetaminophen 650 MG CR tablet Commonly known as: TYLENOL  Take 650-1,300 mg by mouth every 8 (eight) hours as needed for pain.   Alpha-Lipoic Acid 100 MG Tabs Take 100 mg by mouth daily with lunch.   azelastine 0.1 % nasal spray Commonly known as: ASTELIN Place 1 spray into both nostrils daily. Use in each nostril as directed   B-12-SL 1000 MCG Subl Generic drug: Cyanocobalamin Place 500 mcg under the tongue 2 (two) times a day.   chlorzoxazone 500 MG tablet Commonly known as: PARAFON TAKE ONE TABLET FOUR TIMES DAILY AS NEEDED FOR MUSCLE SPASM   clotrimazole-betamethasone cream Commonly known as: LOTRISONE Apply externally BID prn sx up to 2 wks   CoQ-10 100 MG Caps Take 100 mg by mouth daily.   CRANBERRY PO Take 1 capsule by mouth daily.   D-MANNOSE PO Take by mouth.   DHEA 10 MG  Caps Take 15 mg by mouth daily.   EPINEPHrine 0.3 mg/0.3 mL Soaj injection Commonly known as: EPI-PEN   Estriol Powd INSERT 0.5GM 2-3 TIMES WEEKLY AS NEEDED   fexofenadine 60 MG tablet Commonly known as: ALLEGRA Take 60 mg by mouth daily.   glucosamine-chondroitin 500-400 MG tablet Take 1 tablet by mouth 3 (three) times daily.   levothyroxine 75 MCG tablet Commonly known as: SYNTHROID TAKE ONE TABLET EACH MORNING BEFORE BREAKFAST   losartan 100 MG tablet Commonly known as: COZAAR Take 1 tablet (100 mg total) by mouth daily.   LUTEIN-ZEAXANTHIN PO Take 1 tablet by mouth at bedtime.   multivitamin with minerals Tabs tablet Take 1 tablet by mouth every evening.   Probiotic Caps Take 1 capsule by mouth at bedtime.   progesterone (bulk) Powd Apply 1 application topically See admin instructions. 4% compound- topically   triamcinolone 55 MCG/ACT Aero nasal inhaler Commonly known as: Nasacort Allergy 24HR Place 2 sprays daily into the nose.   TURMERIC PO Take 1 tablet by mouth 2 (two) times a week.   Vitamin D3 50 MCG (2000 UT) Tabs Take 2,000 Units by mouth daily.       Allergies:  Allergies  Allergen Reactions  . Colesevelam Nausea And Vomiting    WELCHOL   . Levofloxacin Hives  . Benazepril Other (See Comments)  . Methylisothiazolinone Other (See Comments)    Positive on allergy test  . Thimerosal Other (See Comments)    Positive on allergy test  . Amoxicillin-Pot Clavulanate Nausea Only    Did it involve swelling of the face/tongue/throat, SOB, or low BP? No Did it involve sudden or severe rash/hives, skin peeling, or any reaction on the inside of your mouth or nose? No Did you need to seek medical attention at a hospital or doctor's office? Unknown When did it last happen?unknown If all above answers are "NO", may proceed with cephalosporin use.   . Latex Rash    Family history of latex allergy  . Simvastatin Itching    Family  History: Family History  Problem Relation Age of Onset  . Cancer Mother        lung  . CAD Mother   . Stroke Father   . Breast cancer Sister        61s  . Varicose Veins Son   . Heart disease Maternal Grandmother   . Cancer Paternal Grandmother        throat  . Emphysema Paternal Grandfather   . Fibromyalgia Sister   . Brain cancer Sister   . Breast cancer Other     Social History:  reports that she has never  smoked. She has never used smokeless tobacco. She reports that she does not drink alcohol and does not use drugs.  ROS:                                        Physical Exam: LMP  (LMP Unknown)   Constitutional:  Alert and oriented, No acute distress. HEENT: [place] AT, moist mucus membranes.  Trachea midline, no masses. Cardiovascular: No clubbing, cyanosis, or edema. Respiratory: Normal respiratory effort, no increased work of breathing. GI: Abdomen is soft, nontender, nondistended, no abdominal masses GU: On pelvic examination patient had grade 3 cystocele that just reached the introitus.  She had a moderate central defect.  Cervix descended from 8 or 9 cm to approximately 5 cm.  I think she has some shortening of the anterior vaginal wall with some atrophy.  Grade 1 rectocele mid vaginal vault Skin: No rashes, bruises or suspicious lesions. Lymph: No cervical or inguinal adenopathy. Neurologic: Grossly intact, no focal deficits, moving all 4 extremities. Psychiatric: Normal mood and affect.  Laboratory Data: Lab Results  Component Value Date   WBC 7.0 09/27/2019   HGB 13.2 09/27/2019   HCT 40.5 09/27/2019   MCV 95 09/27/2019   PLT 195 09/27/2019    Lab Results  Component Value Date   CREATININE 1.14 (H) 09/27/2019    No results found for: PSA  No results found for: TESTOSTERONE  No results found for: HGBA1C  Urinalysis    Component Value Date/Time   COLORURINE YELLOW 09/30/2018 0927   APPEARANCEUR Clear 05/07/2019 1530    LABSPEC 1.020 09/30/2018 0927   PHURINE 5.0 09/30/2018 0927   GLUCOSEU Negative 05/07/2019 [place] 09/30/2018 0927   BILIRUBINUR Negative 05/07/2019 [employer] 09/30/2018 0927   PROTEINUR Negative 05/07/2019 [place] 09/30/2018 0927   NITRITE Negative 05/07/2019 1530   NITRITE NEGATIVE 09/30/2018 0927   LEUKOCYTESUR Negative 05/07/2019 [place] 09/30/2018 7829    Pertinent Imaging:   Assessment & Plan: Patient has pelvic organ prolapse.  She has some urgency incontinence.  She has a risk factor for neurogenic bladder and had retention after back surgery.  If she ever had surgery she likely best benefit from a transvaginal hysterectomy with vault suspension and cystocele repair and graft.  Role of urodynamics and pessary discussed.  Role of watchful waiting discussed  Patient wants to think about it but she would like to see Dr. Garwin Brothers in [place] for possible pessary just in case she goes on to have surgery.  She feels uncomfortable with that and I urged her to go back on her estrogen cream hoping that less vaginal dryness would improve the symptom.   There are no diagnoses linked to this encounter.  No follow-ups on file.  Reece Packer, MD  [facility] [address] [place], [place] [address] [phone]

## 2020-01-01 ENCOUNTER — Ambulatory Visit: Payer: Medicare Other | Admitting: Physical Therapy

## 2020-01-01 DIAGNOSIS — G8929 Other chronic pain: Secondary | ICD-10-CM

## 2020-01-01 DIAGNOSIS — M25511 Pain in right shoulder: Secondary | ICD-10-CM | POA: Diagnosis not present

## 2020-01-01 DIAGNOSIS — R293 Abnormal posture: Secondary | ICD-10-CM | POA: Diagnosis not present

## 2020-01-01 DIAGNOSIS — M4125 Other idiopathic scoliosis, thoracolumbar region: Secondary | ICD-10-CM | POA: Diagnosis not present

## 2020-01-01 DIAGNOSIS — M62838 Other muscle spasm: Secondary | ICD-10-CM

## 2020-01-01 DIAGNOSIS — M533 Sacrococcygeal disorders, not elsewhere classified: Secondary | ICD-10-CM | POA: Diagnosis not present

## 2020-01-01 NOTE — Therapy (Signed)
[place] MAIN [facility] SERVICES [address] [place], [place], [address] Phone: [phone]   Fax:  [phone]  Physical Therapy Treatment  Patient Details  Name: Julia Deleon MRN: [id number] Date of Birth: [birth year] No data recorded  Encounter Date: 01/01/2020   PT End of Session - 01/01/20 1447    Visit Number 107    Date for PT Re-Evaluation 01/29/20   Progress note to report  (last on #97  11/20/19 )   Authorization Type Medicare    PT Start Time 0902    PT Stop Time 1000    PT Time Calculation (min) 58 min    Activity Tolerance Patient tolerated treatment well;No increased pain    Behavior During Therapy WFL for tasks assessed/performed           Past Medical History:  Diagnosis Date  . Arthritis   . COPD (chronic obstructive pulmonary disease) ([place])   . Family history of adverse reaction to anesthesia    father was slow to wake up  . Fuch's endothelial dystrophy   . Hyperlipidemia   . Hypothyroidism   . PONV (postoperative nausea and vomiting)    slow to wake up and PONV    Past Surgical History:  Procedure Laterality Date  . APPENDECTOMY    . BACK SURGERY  09/26/2018   L4-5 PLIF by Dr. Arnoldo Morale  . CARDIAC CATHETERIZATION    . CHOLECYSTECTOMY    . EYE SURGERY    . FOOT SURGERY Right    pin removed left  . laser vein surgery    . NASAL SINUS SURGERY    . RIGHT/LEFT HEART CATH AND CORONARY ANGIOGRAPHY N/A 12/06/2017   Procedure: RIGHT/LEFT HEART CATH AND CORONARY ANGIOGRAPHY;  Surgeon: Yolonda Kida, MD;  Location: [facility];  Service: Cardiovascular;  Laterality: N/A;  . TONSILLECTOMY      There were no vitals filed for this visit.   Subjective Assessment - 01/01/20 0907    Subjective Pt reported she visited her urologist bcause she can noticed something bulging in her vagina and a prolapse was confirmed. She noticed she has to urinate more often. Pt wakes up at 5am to urinate. Pt has not been able to  completely emptying her urine since her first child and she tend to go more frequently. Pt noticed more leakage since last week.                          Pelvic Floor Special Questions - 01/01/20 1433    Pelvic Floor Internal Exam pt consented without contraindications     Exam Type Vaginal    Palpation lowered urethra/ uterus/ bowels inside introitus with upward movement of pelvic floor with proper contraction but posterior contraction > anterior,  tightness at bulbospongiosus/ ischiocavernosus externally B with pain      Strength fair squeeze, definite lift             [facility] Adult PT Treatment/Exercise - 01/01/20 1439      Therapeutic Activites    Other Therapeutic Activities explained how pelvic Tx help with prolapse, provided encouragement,  educated on raised pelvis to eliminate gravity and provide more cranial positioning of pelvic organs       Manual Therapy   Internal Pelvic Floor STM/MWM at mons pubis/ anterior pelvic floor internally and externally  PT Short Term Goals - 06/08/19 1133      PT SHORT TERM GOAL #1   Title Patient will demonstrate improved pelvic alignment and balance of musculature surrounding the pelvis to facilitate decreased PFM spasms and decrease pelvic pain.    Baseline Pt. demonstrates severe posterior pelvic tilt, L thoracic/R lumbar scoliosis, and R anterior/L posterior pelvic obliquity. Pt. has demonstrated improved pelvic alignment and posture but has not been able to sustain long-term due to continued lack of postural strength.    Time 5    Period Weeks    Status Achieved    Target Date 03/06/19      PT SHORT TERM GOAL #2   Title Patient will demonstrate a coordinated contraction, relaxation, and bulge of the pelvic floor muscles to demonstrate functional recruitment and motion and allow for bladder emptying.    Baseline Pt. is unable to fully empty bladder and describes pain with catheterization,  demonstrating high tone of PFM.    Time 5    Period Weeks    Status Achieved    Target Date 03/06/19      PT SHORT TERM GOAL #3   Title Patient will demonstrate improved sitting and standing posture to demonstrate learning and decrease stress on the pelvic floor with functional activity.    Baseline Pt. demonstrates severe posterior pelvic tilt, L thoracic/R lumbar scoliosis, and R anterior/L posterior pelvic obliquity. 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 Will be able to cath without pain and will be cleared by physiscian to d/c self-cath due to decreased residual volume with bladder emptying.    Baseline required to cath 3 times per day, painful each time.    Time 10    Period Weeks    Status Achieved      PT LONG TERM GOAL #4   Title Patient will score less than or equal to 40% on the Female NIH-CPSI and 30% on the [facility] to demonstrate a reduction in pain, urinary symptoms, and an improved quality of life.    Baseline Female NIH-CPSI: 38/43 (88%) , PDI: 45/75 (60%); PDI  44/75 (59%) and Female NIH-CPSI: 27/43 (64%) on 02-08-19 , Female NIH-CPSI:  17% on 03-26-19    Time 10    Period Weeks    Status Achieved      PT LONG TERM GOAL #5   Title Patient will demonstrate increased step length with reciprical arm swing, and demonstrate no scissoring steps.    Baseline Pt currently demonstrated scissoring steppage on R LE, increased trunk rotation and deep core engagement with reciprical walking poles.    Time 10    Period Weeks    Status Achieved      PT LONG TERM GOAL #6   Title Pt will increase her distance on the 6MWT from 1079  feet  to  > 1320 feet  order to progress with aerobic exercises and walking longer distances in the community  ( 5/10: 1000 ft,  8/3: )     Time 8    Period Weeks    Status Revised      PT LONG TERM GOAL #7   Title Pt will report waking up > 2 mornings in a row with pain level < 5-6/10 in order to demo maintanence of Tx benefits and more balance of mm tensions achieved with HEP, shoe lift to improve QOL    Baseline 7-8/10 level and benefits from Tx lasted only one morning  (03-19-19 and 03-23-19)    Time 10    Period Weeks    Status Achieved      PT LONG TERM GOAL #8   Title Pt will be able to demo increased PF strength with unilateral UE support on wall from B 3/5 15 reps in order to achieve stronger push off in gait and walk longer distance    Baseline B, L 7 reps  2/5, 10 reps R 3/5  ( 12/17: 15 reps R , 13 reps L, 06/01/19: R 20 reps, L 18 reps  )    Time 10    Period Weeks    Status Achieved      PT LONG TERM GOAL  #9   TITLE Pt will demo shoulder abduction > 120 deg B with improved alignment of thorax over pelvis without manual cues in order to demo proper co-activation of B equal weight bearing in BLE, and decreased thoracic kyhposis/ decreased thoracic rotation 2/2 scoliosis in order to lift plates overhead cabinets/ decreased risk for    Time 10    Period Weeks    Status Achieved      PT LONG TERM GOAL  #10   TITLE Pt  will demo increased height from 63" to > 63 1/2 " and maintain 63 1/2" across 3 weeks ( once a week measurement) and forward head posture improve ( decreased distance from wall to earlobe 17 cm from earlobe to wall to > 12 cm)  in order to be ready to progress to PNF exercise with less shoulder pain and lengthen/ strengthen concave curve on R 2/2 scoliosis to minimize L lateral thigh tightness  (07/06/19 : 63 3/4",  08/10/19: 64"   )    Time 8    Period Weeks    Status Achieved      PT LONG TERM GOAL  #11   TITLE Pt will demo single UE support with lunge position exercises/ yoga poses  in order to demo improved balance and IND with flexibility routine    Baseline BUE support    Time 10    Period Weeks    Status On-going      PT LONG TERM GOAL  #12  TITLE Pt will demo L trunk rotation in R sidelying with zero report of pain in order to progress with longer walking endurance    Time 10    Period Weeks    Status Achieved      PT LONG TERM GOAL  #13   TITLE Pt will demo increased  shoulder abd R against wall in mini squat from 55 deg to > 75 deg in order to demo increased thoracic extension and intercostal lengthening for functional BUE mobility without compensations.  ( 4/2: 88 deg R )    Time 8    Period Weeks    Status Achieved      PT LONG TERM GOAL  #14   TITLE Pt will demo less forward head posture ( earlobe to wall <13  cm) nd maintain height of 64  1/4" for 1 month in order to stand taller w/ less kyphosis and less deviations to spine to walk longer distances ( 11/20/19 : 10.5 cm)      Baseline ( earlobe to wall 16 cm)  ( 64  1/14" on 09/14/19  )     Time 10    Period Weeks    Status Achieved      PT LONG TERM GOAL  #15   TITLE Pt will demo no  ore perineal scar restrictions nor pelvic floor mm tensions and be able to  perform proper coordination of pelvic floor and contraction 3sec, 3 rep in order to minimize leakage.     Time 10    Status Revised                 Plan -  01/01/20 1448    Clinical Impression Statement Internal pelvic floor in hooklying showed pelvic organs inside introitus but pt reported she experienced bulging last week, more prominent by the end of the day. Pt was able to elicit sequential and circumferential contraction with excessive cues for less ab overuse, a pillow under pelvis for more anti-gravity position, and after manual Tx that addressed tightness of anterior pelvic floor mm. Pt was educated on precautions with prolapse and explained how urgency is associated with prolapse. Pt was provided with encouragement and active listening as pt expressed frustration and sadness with the occurrence of prolapse last week. Pt was educated the importance of maintaining anterior tilt of pelvis for optimal pelvic floor lengthening.  Pt continues to benefit from skilled PT.     Personal Factors and Comorbidities Age;Comorbidity 3+    Comorbidities Osteoporosis, scoliosis, Arthritis, COPD, recent lumbosacral fusion for spondylolisthesis.    Examination-Activity Limitations Toileting;Sit;Stand;Bend;Lift;Carry    Examination-Participation Restrictions Interpersonal Relationship;Yard Work;Cleaning;Laundry    Stability/Clinical Decision Making Unstable/Unpredictable    Rehab Potential Good    PT Frequency 2x / week    PT Duration Other (comment)   20   PT Treatment/Interventions ADLs/Self Care Home Management;Aquatic Therapy;Moist Heat;Electrical Stimulation;Traction;Therapeutic activities;Functional mobility training;Stair training;Gait training;Therapeutic exercise;Balance training;Neuromuscular re-education;Patient/family education;Manual techniques;Dry needling;Passive range of motion;Scar mobilization;Taping    Consulted and Agree with Plan of Care Patient           Patient will benefit from skilled therapeutic intervention in order to improve the following deficits and impairments:  Abnormal gait, Decreased balance, Increased muscle spasms, Decreased  range of motion, Decreased scar mobility, Improper body mechanics, Decreased coordination, Decreased strength, Increased fascial restricitons, Impaired flexibility, Postural dysfunction, Pain  Visit Diagnosis: Chronic right shoulder pain  Other muscle spasm  Abnormal posture  Other idiopathic scoliosis, thoracolumbar region  Problem List Patient Active Problem List   Diagnosis Date Noted  . Family history of breast cancer 11/16/2019  . Chronic UTI 12/06/2018  . Status post lumbar surgery 12/06/2018  . Spondylolisthesis, lumbar region 09/26/2018  . Aortic atherosclerosis ([place]) 08/31/2018  . CAD (coronary artery disease) 02/23/2018  . Advanced care planning/counseling discussion 03/28/2017  . Varicose veins of both lower extremities with complications 91/22/5834  . Arthritis 12/09/2016  . Vaginal atrophy 09/10/2015  . Vasomotor symptoms due to menopause 09/10/2015  . Onychomycosis due to dermatophyte 04/07/2015  . Bursitis of right shoulder 02/26/2015  . Environmental and seasonal allergies 02/26/2015  . Other allergic rhinitis 02/26/2015  . Osteoporosis 11/26/2014  . COPD (chronic obstructive pulmonary disease) ([place]) 11/26/2014  . Fuchs' corneal dystrophy 11/26/2014  . Benign hypertensive renal disease 11/26/2014  . Hyperlipidemia 11/26/2014  . Chronic kidney disease, stage III (moderate) ([place]) 11/26/2014  . Hypothyroidism 11/26/2014  . Endothelial corneal dystrophy 11/26/2014    Jerl Mina ,PT, DPT, E-RYT  01/01/2020, 2:49 PM  [place] MAIN [facility] SERVICES [address] [place], [place], [address] Phone: [phone]   Fax:  [phone]  Name: Natsha C Tomasini MRN: [id number] Date of Birth: 1945-06-14

## 2020-01-01 NOTE — Patient Instructions (Signed)
Place folded towel or pillow under pelvis,  Knees bent,  Anterior tilt of pelvis  10 quick pelvic floor with exhalation,   3 x day

## 2020-01-03 ENCOUNTER — Other Ambulatory Visit: Payer: Self-pay

## 2020-01-03 ENCOUNTER — Ambulatory Visit: Payer: Medicare Other | Admitting: Physical Therapy

## 2020-01-03 DIAGNOSIS — G8929 Other chronic pain: Secondary | ICD-10-CM

## 2020-01-03 DIAGNOSIS — M62838 Other muscle spasm: Secondary | ICD-10-CM | POA: Diagnosis not present

## 2020-01-03 DIAGNOSIS — M4125 Other idiopathic scoliosis, thoracolumbar region: Secondary | ICD-10-CM | POA: Diagnosis not present

## 2020-01-03 DIAGNOSIS — M533 Sacrococcygeal disorders, not elsewhere classified: Secondary | ICD-10-CM | POA: Diagnosis not present

## 2020-01-03 DIAGNOSIS — M25511 Pain in right shoulder: Secondary | ICD-10-CM | POA: Diagnosis not present

## 2020-01-03 DIAGNOSIS — R293 Abnormal posture: Secondary | ICD-10-CM | POA: Diagnosis not present

## 2020-01-03 NOTE — Patient Instructions (Signed)
Practice proper pelvic floor coordination  Inhale: expand pelvic floor muscles Exhale" "j" scoop, allow pelvic floor to close, lift first before belly sinks   ( not "draw abdominal muscle to spine" or strain with abdominal muscles")   

## 2020-01-03 NOTE — Therapy (Signed)
[place] MAIN [facility] SERVICES [address] [place], [place], [address] Phone: [phone]   Fax:  [phone]  Physical Therapy Treatment  Patient Details  Name: Julia Deleon MRN: [id number] Date of Birth: 03-Sep-1945 No data recorded  Encounter Date: 01/03/2020   PT End of Session - 01/03/20 1417    Visit Number 108    Date for PT Re-Evaluation 01/29/20   Progress note to report  (last on #97  11/20/19 )   Authorization Type Medicare    PT Start Time 1109    PT Stop Time 1204    PT Time Calculation (min) 55 min    Activity Tolerance Patient tolerated treatment well;No increased pain    Behavior During Therapy WFL for tasks assessed/performed           Past Medical History:  Diagnosis Date   Arthritis    COPD (chronic obstructive pulmonary disease) (HCC)    Family history of adverse reaction to anesthesia    father was slow to wake up   Fuch's endothelial dystrophy    Hyperlipidemia    Hypothyroidism    PONV (postoperative nausea and vomiting)    slow to wake up and PONV    Past Surgical History:  Procedure Laterality Date   APPENDECTOMY     BACK SURGERY  09/26/2018   L4-5 PLIF by Dr. Arnoldo Morale   CARDIAC CATHETERIZATION     CHOLECYSTECTOMY     EYE SURGERY     FOOT SURGERY Right    pin removed left   laser vein surgery     NASAL SINUS SURGERY     RIGHT/LEFT HEART CATH AND CORONARY ANGIOGRAPHY N/A 12/06/2017   Procedure: RIGHT/LEFT HEART CATH AND CORONARY ANGIOGRAPHY;  Surgeon: Yolonda Kida, MD;  Location: [place] [facility];  Service: Cardiovascular;  Laterality: N/A;   TONSILLECTOMY      There were no vitals filed for this visit.   Subjective Assessment - 01/03/20 1352    Subjective Pt reported she did not have increased pain after last session. Pt noticed less lowered position of bl                          Pelvic Floor Special Questions - 01/03/20 1440    Prolapse Anterior Wall    standing, inside introitus, decreased upward contraction   Pelvic Floor Internal Exam pt consented without contraindications     Exam Type Vaginal    Palpation at 5-7 o'clock and externally at ischial tuberosity B, puborectalist anterior B    Strength fair squeeze, definite lift             [facility] Adult PT Treatment/Exercise - 01/03/20 1435      Neuro Re-ed    Neuro Re-ed Details  Cued increased excursion of diaphragm, pelvic excursion, sequential and circumferential contraction of eplvic floor all 3 layers        Modalities   Modalities Moist Heat      Moist Heat Therapy   Number Minutes Moist Heat 5 Minutes    Moist Heat Location --   perineum      Manual Therapy   Internal Pelvic Floor Fascial release at 5-7 o'clock and externally at ischial tuberosity B, puborectalist anterior B,      palpation at ATLA R w/ reproduction of radiating pain L                   PT Short  Term Goals - 06/08/19 1133      PT SHORT TERM GOAL #1   Title Patient will demonstrate improved pelvic alignment and balance of musculature surrounding the pelvis to facilitate decreased PFM spasms and decrease pelvic pain.    Baseline Pt. demonstrates severe posterior pelvic tilt, L thoracic/R lumbar scoliosis, and R anterior/L posterior pelvic obliquity. Pt. has demonstrated improved pelvic alignment and posture but has not been able to sustain long-term due to continued lack of postural strength.    Time 5    Period Weeks    Status Achieved    Target Date 03/06/19      PT SHORT TERM GOAL #2   Title Patient will demonstrate a coordinated contraction, relaxation, and bulge of the pelvic floor muscles to demonstrate functional recruitment and motion and allow for bladder emptying.    Baseline Pt. is unable to fully empty bladder and describes pain with catheterization, demonstrating high tone of PFM.    Time 5    Period Weeks    Status Achieved    Target Date 03/06/19      PT SHORT TERM GOAL #3     Title Patient will demonstrate improved sitting and standing posture to demonstrate learning and decrease stress on the pelvic floor with functional activity.    Baseline Pt. demonstrates severe posterior pelvic tilt, L thoracic/R lumbar scoliosis, and R anterior/L posterior pelvic obliquity. 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 Will be able to cath without pain and will be cleared by physiscian to d/c self-cath due to decreased residual volume with bladder emptying.    Baseline required to cath 3 times per day, painful each time.    Time 10    Period Weeks    Status Achieved      PT LONG TERM GOAL #4   Title Patient will score less than or equal to 40% on the Female NIH-CPSI and 30% on the [facility] to demonstrate a reduction in pain, urinary symptoms, and an improved quality of life.    Baseline Female NIH-CPSI: 38/43 (88%) , PDI: 45/75 (60%); PDI 44/75 (59%) and Female NIH-CPSI: 27/43 (64%) on 02-08-19 , Female NIH-CPSI: 17% on  03-26-19    Time 10    Period Weeks    Status  Achieved      PT LONG TERM GOAL #5   Title Patient will demonstrate increased step length with reciprical arm swing, and demonstrate no scissoring steps.    Baseline Pt currently demonstrated scissoring steppage on R LE, increased trunk rotation and deep core engagement with reciprical walking poles.    Time 10    Period Weeks    Status Achieved      PT LONG TERM GOAL #6   Title Pt will increase her distance on the 6MWT from 1079  feet  to  > 1320 feet  order to progress with aerobic exercises and walking longer distances in the community  ( 5/10: 1000 ft,  8/3: )     Time 8    Period Weeks    Status Revised      PT LONG TERM GOAL #7   Title Pt will report waking up > 2 mornings in a row with pain level < 5-6/10 in order to demo maintanence of Tx benefits and more balance of mm tensions achieved with HEP, shoe lift to improve QOL    Baseline 7-8/10 level and benefits from Tx lasted only one morning  (03-19-19 and 03-23-19)    Time 10    Period Weeks    Status Achieved      PT LONG TERM GOAL #8   Title Pt will be able to demo increased PF strength with unilateral UE support on wall from B 3/5 15 reps in order to achieve stronger push off in gait and walk longer distance    Baseline B, L 7 reps  2/5, 10 reps R 3/5  ( 12/17: 15 reps R , 13 reps L, 06/01/19: R 20 reps, L 18 reps  )    Time 10    Period Weeks    Status Achieved      PT LONG TERM GOAL  #9   TITLE Pt will demo shoulder abduction > 120 deg B with improved alignment of thorax over pelvis without manual cues in order to demo proper co-activation of B equal weight bearing in BLE, and decreased thoracic kyhposis/ decreased thoracic rotation 2/2 scoliosis in order to lift plates overhead cabinets/ decreased risk for    Time 10    Period Weeks    Status Achieved      PT LONG TERM GOAL  #10   TITLE Pt will demo increased height from 63" to > 63 1/2 " and maintain 63 1/2" across 3 weeks ( once a week measurement) and forward head  posture improve ( decreased distance from wall to earlobe 17 cm from earlobe to wall to > 12 cm)  in order to be ready to progress to PNF exercise with less shoulder pain and lengthen/ strengthen concave curve on R 2/2 scoliosis to minimize L lateral thigh tightness  (07/06/19 : 63 3/4",  08/10/19: 64"   )    Time 8    Period Weeks    Status Achieved      PT LONG TERM GOAL  #11   TITLE Pt will demo single UE support with lunge position exercises/ yoga poses  in order to demo improved balance and IND with flexibility routine    Baseline BUE support    Time 10    Period Weeks    Status On-going      PT LONG TERM GOAL  #12   TITLE  Pt will demo L trunk rotation in R sidelying with zero report of pain in order to progress with longer walking endurance    Time 10    Period Weeks    Status Achieved      PT LONG TERM GOAL  #13   TITLE Pt will demo increased  shoulder abd R against wall in mini squat from 55 deg to > 75 deg in order to demo increased thoracic extension and intercostal lengthening for functional BUE mobility without compensations.  ( 4/2: 88 deg R )    Time 8    Period Weeks    Status Achieved      PT LONG TERM GOAL  #14   TITLE Pt will demo less forward head posture ( earlobe to wall <13  cm) nd maintain height of 64  1/4" for 1 month in order to stand taller w/ less kyphosis and less deviations to spine to walk longer distances ( 11/20/19 : 10.5 cm)      Baseline ( earlobe to wall 16 cm)  ( 64  1/14" on 09/14/19  )     Time 10    Period Weeks    Status Achieved      PT LONG TERM GOAL  #15   TITLE Pt will demo no  ore perineal scar restrictions nor pelvic floor mm tensions and be able to  perform proper coordination of pelvic floor and contraction 3sec, 3 rep in order to minimize leakage.     Time 10    Status Revised                 Plan - 01/03/20 1429    Clinical Impression Statement Pt continued to required internal pelvic floor mm releases to minimize perineal  scar and tightness at anterior mm. Pt reported referred radiating pain at L LE with palpation at R posterior mm at ATLA/ 7 o' clock position. Palpation at L pelvic floor did not promote radicular pain which yields the need to maintain mobility of R SIJ and R pelvic floor mm mobility.   Pt required excessive cues for sequential contraction of all layers of pelvic floor mm to promote upward position of bladder. Pt showed poor activation in upright standing position. Plan to progress with endurance contractions training next session. Withheld quick contractions due to remaining tightness of pelvic floor mm and deferred pt to practice coordination this week. Pt continues to benefit from skilled PT.    Personal Factors and Comorbidities Age;Comorbidity 3+    Comorbidities Osteoporosis, scoliosis, Arthritis, COPD, recent lumbosacral fusion for spondylolisthesis.    Examination-Activity Limitations Toileting;Sit;Stand;Bend;Lift;Carry    Examination-Participation Restrictions Interpersonal Relationship;Yard Work;Cleaning;Laundry    Stability/Clinical Decision Making Unstable/Unpredictable    Rehab Potential Good    PT Frequency 2x / week    PT Duration Other (comment)   20   PT Treatment/Interventions ADLs/Self Care Home Management;Aquatic Therapy;Moist Heat;Electrical Stimulation;Traction;Therapeutic activities;Functional mobility training;Stair training;Gait training;Therapeutic exercise;Balance training;Neuromuscular re-education;Patient/family education;Manual techniques;Dry needling;Passive range of motion;Scar mobilization;Taping    Consulted and Agree with Plan of Care Patient           Patient will benefit from skilled therapeutic intervention in order to improve the following deficits and impairments:  Abnormal gait, Decreased balance, Increased muscle spasms, Decreased range of motion, Decreased scar mobility, Improper body mechanics, Decreased coordination, Decreased strength, Increased fascial  restricitons, Impaired flexibility, Postural dysfunction, Pain  Visit Diagnosis: Other idiopathic scoliosis, thoracolumbar region  Chronic right shoulder pain  Other muscle spasm  Abnormal posture     Problem List Patient Active Problem List   Diagnosis Date Noted   Family history of breast cancer 11/16/2019   Chronic UTI 12/06/2018   Status post lumbar surgery 12/06/2018   Spondylolisthesis, lumbar region 09/26/2018   Aortic atherosclerosis ([place]) 08/31/2018   CAD (coronary artery disease) 02/23/2018   Advanced care planning/counseling discussion 03/28/2017   Varicose veins of both lower extremities with complications 16/02/9603   Arthritis 12/09/2016   Vaginal atrophy 09/10/2015   Vasomotor symptoms due to menopause 09/10/2015   Onychomycosis due to dermatophyte 04/07/2015   Bursitis of right shoulder 02/26/2015   Environmental and seasonal allergies 02/26/2015   Other allergic rhinitis 02/26/2015   Osteoporosis 11/26/2014   COPD (chronic obstructive pulmonary disease) ([place]) 11/26/2014   Fuchs' corneal dystrophy 11/26/2014   Benign hypertensive renal disease 11/26/2014   Hyperlipidemia 11/26/2014   Chronic kidney disease, stage III (moderate) ([place]) 11/26/2014   Hypothyroidism 11/26/2014   Endothelial corneal dystrophy 11/26/2014    Jerl Mina ,PT, DPT, E-RYT  01/03/2020, 2:43 PM  [place] MAIN [facility] SERVICES [address] [place], [place], [address] Phone: [phone]   Fax:  [phone]  Name: Sentoria C Carradine MRN: [id number] Date of Birth: 05/07/1946

## 2020-01-04 ENCOUNTER — Ambulatory Visit: Payer: Medicare Other | Admitting: Physical Therapy

## 2020-01-08 ENCOUNTER — Ambulatory Visit: Payer: Medicare Other | Admitting: Physical Therapy

## 2020-01-08 ENCOUNTER — Other Ambulatory Visit: Payer: Self-pay

## 2020-01-08 DIAGNOSIS — M25511 Pain in right shoulder: Secondary | ICD-10-CM | POA: Diagnosis not present

## 2020-01-08 DIAGNOSIS — M62838 Other muscle spasm: Secondary | ICD-10-CM | POA: Diagnosis not present

## 2020-01-08 DIAGNOSIS — M4125 Other idiopathic scoliosis, thoracolumbar region: Secondary | ICD-10-CM

## 2020-01-08 DIAGNOSIS — R293 Abnormal posture: Secondary | ICD-10-CM

## 2020-01-08 DIAGNOSIS — M533 Sacrococcygeal disorders, not elsewhere classified: Secondary | ICD-10-CM | POA: Diagnosis not present

## 2020-01-08 DIAGNOSIS — G8929 Other chronic pain: Secondary | ICD-10-CM

## 2020-01-08 NOTE — Patient Instructions (Signed)
PELVIC FLOOR / KEGEL EXERCISES   Pelvic floor/ Kegel exercises are used to strengthen the muscles in the base of your pelvis that are responsible for supporting your pelvic organs and preventing urine/feces leakage. Based on your therapist's recommendations, they can be performed while standing, sitting, or lying down. Imagine pelvic floor area as a diamond with pelvic landmarks: top =pubic bone, bottom tip=tailbone, sides=sitting bones (ischial tuberosities).    Make yourself aware of this muscle group by using these cues while coordinating your breath:  Inhale, feel pelvic floor diamond area lower like hammock towards your feet and ribcage/belly expanding. Pause. Let the exhale naturally and feel your belly sink, abdominal muscles hugging in around you and you may notice the pelvic diamond draws upward towards your head forming a umbrella shape. Give a squeeze during the exhalation like you are stopping the flow of urine. If you are squeezing the buttock muscles, try to give 50% less effort.   Common Errors:  Breath holding: If you are holding your breath, you may be bearing down against your bladder instead of pulling it up. If you belly bulges up while you are squeezing, you are holding your breath. Be sure to breathe gently in and out while exercising. Counting out loud may help you avoid holding your breath.  Accessory muscle use: You should not see or feel other muscle movement when performing pelvic floor exercises. When done properly, no one can tell that you are performing the exercises. Keep the buttocks, belly and inner thighs relaxed.  Overdoing it: Your muscles can fatigue and stop working for you if you over-exercise. You may actually leak more or feel soreness at the lower abdomen or rectum.  YOUR HOME EXERCISE PROGRAM     SHORT HOLDS: Position: on bback with towel under hips,   Inhale and then exhale. Then squeeze the muscle.  (Be sure to let belly sink in with exhales and  not push outward)  Perform 4 repetitions, 5 different Times/day                      DECREASE DOWNWARD PRESSURE ON  YOUR PELVIC FLOOR, ABDOMINAL, LOW BACK MUSCLES       PRESERVE YOUR PELVIC HEALTH LONG-TERM   ** SQUEEZE pelvic floor BEFORE YOUR SNEEZE, COUGH, LAUGH   ** EXHALE BEFORE YOU RISE AGAINST GRAVITY (lifting, sit to stand, from squat to stand)   ** LOG ROLL OUT OF BED INSTEAD OF CRUNCH/SIT-UP

## 2020-01-08 NOTE — Therapy (Signed)
[place] MAIN [facility] SERVICES [address] [place], [place], [address] Phone: [phone]   Fax:  [phone]  Physical Therapy Treatment  Patient Details  Name: Julia Deleon MRN: [id number] Date of Birth: 1946/02/22 No data recorded  Encounter Date: 01/08/2020   PT End of Session - 01/08/20 1212    Visit Number 109    Date for PT Re-Evaluation 01/29/20   Progress note to report  (last on #97  11/20/19 )   Authorization Type Medicare    PT Start Time 1010    PT Stop Time 1110    PT Time Calculation (min) 60 min    Activity Tolerance Patient tolerated treatment well;No increased pain    Behavior During Therapy WFL for tasks assessed/performed           Past Medical History:  Diagnosis Date  . Arthritis   . COPD (chronic obstructive pulmonary disease) ([place])   . Family history of adverse reaction to anesthesia    father was slow to wake up  . Fuch's endothelial dystrophy   . Hyperlipidemia   . Hypothyroidism   . PONV (postoperative nausea and vomiting)    slow to wake up and PONV    Past Surgical History:  Procedure Laterality Date  . APPENDECTOMY    . BACK SURGERY  09/26/2018   L4-5 PLIF by Dr. Arnoldo Morale  . CARDIAC CATHETERIZATION    . CHOLECYSTECTOMY    . EYE SURGERY    . FOOT SURGERY Right    pin removed left  . laser vein surgery    . NASAL SINUS SURGERY    . RIGHT/LEFT HEART CATH AND CORONARY ANGIOGRAPHY N/A 12/06/2017   Procedure: RIGHT/LEFT HEART CATH AND CORONARY ANGIOGRAPHY;  Surgeon: Yolonda Kida, MD;  Location: [place] [facility];  Service: Cardiovascular;  Laterality: N/A;  . TONSILLECTOMY      There were no vitals filed for this visit.   Subjective Assessment - 01/08/20 0910    Subjective Pt went to the gynecologist and got some cream to use. Pt felt irritation on the outside tissue                          Pelvic Floor Special Questions - 01/08/20 1222    Prolapse --   more upward  position in hooklying w/ towel under hips   Pelvic Floor Internal Exam pt consented without contraindications     Exam Type Vaginal    Palpation no tightness noted, decreased activation at top triangle mm     Strength fair squeeze, definite lift             [facility] Adult PT Treatment/Exercise - 01/08/20 1215      Therapeutic Activites    Other Therapeutic Activities explained the importance of pelvic floor exercises and approaches for good outcomes and not to overdo       Neuro Re-ed    Neuro Re-ed Details  cued for circumferential contraction, anterior tilt of pelvic        Manual Therapy   Internal Pelvic Floor fascial releases at R anterior pelvic floor , facilitating circumferential contraction                     PT Short Term Goals - 06/08/19 1133      PT SHORT TERM GOAL #1   Title Patient will demonstrate improved pelvic alignment and balance of musculature surrounding the pelvis to  facilitate decreased PFM spasms and decrease pelvic pain.    Baseline Pt. demonstrates severe posterior pelvic tilt, L thoracic/R lumbar scoliosis, and R anterior/L posterior pelvic obliquity. Pt. has demonstrated improved pelvic alignment and posture but has not been able to sustain long-term due to continued lack of postural strength.    Time 5    Period Weeks    Status Achieved    Target Date 03/06/19      PT SHORT TERM GOAL #2   Title Patient will demonstrate a coordinated contraction, relaxation, and bulge of the pelvic floor muscles to demonstrate functional recruitment and motion and allow for bladder emptying.    Baseline Pt. is unable to fully empty bladder and describes pain with catheterization, demonstrating high tone of PFM.    Time 5    Period Weeks    Status Achieved    Target Date 03/06/19      PT SHORT TERM GOAL #3   Title Patient will demonstrate improved sitting and standing posture to demonstrate learning and decrease stress on the pelvic floor with functional  activity.    Baseline Pt. demonstrates severe posterior pelvic tilt, L thoracic/R lumbar scoliosis, and R anterior/L posterior pelvic obliquity. 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 Will be able to cath without pain and will be cleared by physiscian to d/c self-cath due to decreased residual volume with bladder emptying.    Baseline required to cath 3 times per day, painful each time.    Time 10    Period Weeks    Status Achieved      PT LONG TERM GOAL #4   Title Patient will score less than or equal to 40% on the Female NIH-CPSI and 30% on the [facility] to demonstrate a reduction in pain, urinary symptoms, and an improved quality of life.    Baseline Female NIH-CPSI: 38/43 (88%) , PDI: 45/75 (60%); PDI 44/75 (59%) and Female NIH-CPSI: 27/43 (64%) on 02-08-19 , Female NIH-CPSI: 17% on 03-26-19    Time 10    Period Weeks    Status Achieved      PT LONG TERM GOAL #5   Title Patient will demonstrate increased  step length with reciprical arm swing, and demonstrate no scissoring steps.      Baseline Pt currently demonstrated scissoring steppage on R LE, increased trunk rotation and deep core engagement with reciprical walking poles.    Time 10    Period Weeks    Status Achieved      PT LONG TERM GOAL #6   Title Pt will increase her distance on the 6MWT from 1079  feet  to  > 1320 feet  order to progress with aerobic exercises and walking longer distances in the community  ( 5/10: 1000 ft,  8/3: )     Time 8    Period Weeks    Status Revised      PT LONG TERM GOAL #7   Title Pt will report waking up > 2 mornings in a row with pain level < 5-6/10 in order to demo maintanence of Tx benefits and more balance of mm tensions achieved with HEP, shoe lift to improve QOL    Baseline 7-8/10 level and benefits from Tx lasted only one morning  (03-19-19 and 03-23-19)    Time 10    Period Weeks    Status Achieved      PT LONG TERM GOAL #8   Title Pt will be able to demo increased PF strength with unilateral UE support on wall from B 3/5 15 reps in order to achieve stronger push off in gait and walk longer distance    Baseline B, L 7 reps  2/5, 10 reps R 3/5  ( 12/17: 15 reps R , 13 reps L, 06/01/19: R 20 reps, L 18 reps  )    Time 10    Period Weeks    Status Achieved      PT LONG TERM GOAL  #9   TITLE Pt will demo shoulder abduction > 120 deg B with improved alignment of thorax over pelvis without manual cues in order to demo proper co-activation of B equal weight bearing in BLE, and decreased thoracic kyhposis/ decreased thoracic rotation 2/2 scoliosis in order to lift plates overhead cabinets/ decreased risk for    Time 10    Period Weeks    Status Achieved      PT LONG TERM GOAL  #10   TITLE Pt will demo increased height from 63" to > 63 1/2 " and maintain 63 1/2" across 3 weeks ( once a week measurement) and forward head posture improve ( decreased distance from wall to earlobe 17 cm from earlobe to wall to > 12 cm)  in order to be ready to progress to PNF exercise with less  shoulder pain and lengthen/ strengthen concave curve on R 2/2 scoliosis to minimize L lateral thigh tightness  (07/06/19 : 63 3/4",  08/10/19: 64"   )    Time 8    Period Weeks    Status Achieved      PT LONG TERM GOAL  #11   TITLE Pt will demo single UE support with lunge position exercises/ yoga poses  in order to demo improved balance and IND with flexibility routine    Baseline BUE support    Time 10    Period Weeks    Status On-going      PT LONG TERM GOAL  #12   TITLE Pt will demo L trunk rotation in R sidelying with zero report of pain in order to progress with longer walking endurance    Time 10    Period Weeks  Status Achieved      PT LONG TERM GOAL  #13   TITLE Pt will demo increased  shoulder abd R against wall in mini squat from 55 deg to > 75 deg in order to demo increased thoracic extension and intercostal lengthening for functional BUE mobility without compensations.  ( 4/2: 88 deg R )    Time 8    Period Weeks    Status Achieved      PT LONG TERM GOAL  #14   TITLE Pt will demo less forward head posture ( earlobe to wall <13  cm) nd maintain height of 64  1/4" for 1 month in order to stand taller w/ less kyphosis and less deviations to spine to walk longer distances ( 11/20/19 : 10.5 cm)      Baseline ( earlobe to wall 16 cm)  ( 64  1/14" on 09/14/19  )     Time 10    Period Weeks    Status Achieved      PT LONG TERM GOAL  #15   TITLE Pt will demo no  ore perineal scar restrictions nor pelvic floor mm tensions and be able to  perform proper coordination of pelvic floor and contraction 3sec, 3 rep in order to minimize leakage.     Time 10    Status Revised                 Plan - 01/08/20 0912    Clinical Impression Statement  Pt demo'd decreased pelvic floor scar restrictions in the posterior mm and achieved increased mobility. Pt's R anterior mm was less activated and required facilitation for more mobility. Pt achieved more circumferential and sequential  contraction with a stronger contraction with no more upper ab mm overuse. Explained ways to minimize straining pelvic floor to minimize worsening of prolapse. Pt progressed to kegels training with 4 quick contractions 1 sec holds.  Bladder in a more upward position today. Plan to progress to endurance contractions at next session. Plan to resume  walking endurance training.    Pt went to the gynecologist and got some cream to use. Pt felt irritation on the outside tissue and f/u with PT's notice of redness on vulva from past sessions. Today, the tissue appeared less red and irritated  Pt continues to benefit from skilled PT     Personal Factors and Comorbidities Age;Comorbidity 3+    Comorbidities Osteoporosis, scoliosis, Arthritis, COPD, recent lumbosacral fusion for spondylolisthesis.    Examination-Activity Limitations Toileting;Sit;Stand;Bend;Lift;Carry    Examination-Participation Restrictions Interpersonal Relationship;Yard Work;Cleaning;Laundry    Stability/Clinical Decision Making Unstable/Unpredictable    Rehab Potential Good    PT Frequency 2x / week    PT Duration Other (comment)   20   PT Treatment/Interventions ADLs/Self Care Home Management;Aquatic Therapy;Moist Heat;Electrical Stimulation;Traction;Therapeutic activities;Functional mobility training;Stair training;Gait training;Therapeutic exercise;Balance training;Neuromuscular re-education;Patient/family education;Manual techniques;Dry needling;Passive range of motion;Scar mobilization;Taping    Consulted and Agree with Plan of Care Patient           Patient will benefit from skilled therapeutic intervention in order to improve the following deficits and impairments:  Abnormal gait, Decreased balance, Increased muscle spasms, Decreased range of motion, Decreased scar mobility, Improper body mechanics, Decreased coordination, Decreased strength, Increased fascial restricitons, Impaired flexibility, Postural dysfunction,  Pain  Visit Diagnosis:   Other muscle spasm  Other idiopathic scoliosis, thoracolumbar region  Abnormal posture  Chronic right shoulder pain     Problem List Patient Active Problem List   Diagnosis Date  Noted  . Family history of breast cancer 11/16/2019  . Chronic UTI 12/06/2018  . Status post lumbar surgery 12/06/2018  . Spondylolisthesis, lumbar region 09/26/2018  . Aortic atherosclerosis ([place]) 08/31/2018  . CAD (coronary artery disease) 02/23/2018  . Advanced care planning/counseling discussion 03/28/2017  . Varicose veins of both lower extremities with complications 81/05/7508  . Arthritis 12/09/2016  . Vaginal atrophy 09/10/2015  . Vasomotor symptoms due to menopause 09/10/2015  . Onychomycosis due to dermatophyte 04/07/2015  . Bursitis of right shoulder 02/26/2015  . Environmental and seasonal allergies 02/26/2015  . Other allergic rhinitis 02/26/2015  . Osteoporosis 11/26/2014  . COPD (chronic obstructive pulmonary disease) ([place]) 11/26/2014  . Fuchs' corneal dystrophy 11/26/2014  . Benign hypertensive renal disease 11/26/2014  . Hyperlipidemia 11/26/2014  . Chronic kidney disease, stage III (moderate) ([place]) 11/26/2014  . Hypothyroidism 11/26/2014  . Endothelial corneal dystrophy 11/26/2014    Jerl Mina 01/08/2020, 4:53 PM  [place] MAIN [facility] SERVICES [address] [place], [place], [address] Phone: [phone]   Fax:  [phone]  Name: Ashani C Arutyunyan MRN: [id number] Date of Birth: Dec 20, 1945

## 2020-01-10 ENCOUNTER — Other Ambulatory Visit: Payer: Self-pay

## 2020-01-10 ENCOUNTER — Ambulatory Visit: Payer: Medicare Other | Attending: Urology | Admitting: Physical Therapy

## 2020-01-10 DIAGNOSIS — M62838 Other muscle spasm: Secondary | ICD-10-CM | POA: Diagnosis not present

## 2020-01-10 DIAGNOSIS — M25511 Pain in right shoulder: Secondary | ICD-10-CM | POA: Insufficient documentation

## 2020-01-10 DIAGNOSIS — G8929 Other chronic pain: Secondary | ICD-10-CM | POA: Insufficient documentation

## 2020-01-10 DIAGNOSIS — R293 Abnormal posture: Secondary | ICD-10-CM | POA: Diagnosis not present

## 2020-01-10 DIAGNOSIS — M4125 Other idiopathic scoliosis, thoracolumbar region: Secondary | ICD-10-CM | POA: Diagnosis not present

## 2020-01-10 NOTE — Patient Instructions (Signed)
Use hiking poles when walking long distances in the community   Seated L heel raise and lower with toes up, ballmounds spread and pressing down,  20 reps x 3 x day

## 2020-01-10 NOTE — Therapy (Signed)
[place] MAIN [facility] SERVICES [address] [place], [place], [address] Phone: [phone]   Fax:  [phone]  Physical Therapy Treatment  Patient Details  Name: Julia Deleon MRN: [id number] Date of Birth: Aug 16, 1945 No data recorded  Encounter Date: 01/10/2020   PT End of Session - 01/10/20 1632    Visit Number 110    Date for PT Re-Evaluation 01/29/20   Progress note to report  (last on #97  11/20/19 )   Authorization Type Medicare    PT Start Time 0900    PT Stop Time 1000    PT Time Calculation (min) 60 min    Activity Tolerance Patient tolerated treatment well;No increased pain    Behavior During Therapy WFL for tasks assessed/performed           Past Medical History:  Diagnosis Date  . Arthritis   . COPD (chronic obstructive pulmonary disease) ([place])   . Family history of adverse reaction to anesthesia    father was slow to wake up  . Fuch's endothelial dystrophy   . Hyperlipidemia   . Hypothyroidism   . PONV (postoperative nausea and vomiting)    slow to wake up and PONV    Past Surgical History:  Procedure Laterality Date  . APPENDECTOMY    . BACK SURGERY  09/26/2018   L4-5 PLIF by Dr. Arnoldo Morale  . CARDIAC CATHETERIZATION    . CHOLECYSTECTOMY    . EYE SURGERY    . FOOT SURGERY Right    pin removed left  . laser vein surgery    . NASAL SINUS SURGERY    . RIGHT/LEFT HEART CATH AND CORONARY ANGIOGRAPHY N/A 12/06/2017   Procedure: RIGHT/LEFT HEART CATH AND CORONARY ANGIOGRAPHY;  Surgeon: Yolonda Kida, MD;  Location: [facility];  Service: Cardiovascular;  Laterality: N/A;  . TONSILLECTOMY      There were no vitals filed for this visit.   Subjective Assessment - 01/10/20 0903    Subjective Pt reported her L radaiting pain was worst the past 2 weeks. Pt used a mm relaxer 2 weeks ago. Pt was without the medication for one week due to pt not being able to get her pain med filled. Pt thinks her L LE pain got  worse when she was off of the mm relaxer medicaiton. Since getting it refilled, pt noticed it has not helped with the pain. Pt performs walking with hiking poles 6 min 1-3x day. Pt reports the stretches cause radiating pain but she still does them.              [facility] PT Assessment - 01/10/20 0957      Strength   Overall Strength Comments LLE 3+5/ R 4+/5                          [facility] Adult PT Treatment/Exercise - 01/10/20 0957      Ambulation/Gait   Gait Comments return of R trunk lean       Modalities   Modalities Moist Heat      Moist Heat Therapy   Number Minutes Moist Heat 5 Minutes    Moist Heat Location --   leg     Manual Therapy   Manual therapy comments skin rolling along ITband, STM/MW hamstring/ adductor, adductor hallucis in L                     PT Short  Term Goals - 06/08/19 1133      PT SHORT TERM GOAL #1   Title Patient will demonstrate improved pelvic alignment and balance of musculature surrounding the pelvis to facilitate decreased PFM spasms and decrease pelvic pain.    Baseline Pt. demonstrates severe posterior pelvic tilt, L thoracic/R lumbar scoliosis, and R anterior/L posterior pelvic obliquity. Pt. has demonstrated improved pelvic alignment and posture but has not been able to sustain long-term due to continued lack of postural strength.    Time 5    Period Weeks    Status Achieved    Target Date 03/06/19      PT SHORT TERM GOAL #2   Title Patient will demonstrate a coordinated contraction, relaxation, and bulge of the pelvic floor muscles to demonstrate functional recruitment and motion and allow for bladder emptying.    Baseline Pt. is unable to fully empty bladder and describes pain with catheterization, demonstrating high tone of PFM.    Time 5    Period Weeks    Status Achieved    Target Date 03/06/19      PT SHORT TERM GOAL #3   Title Patient will demonstrate improved sitting and standing posture to demonstrate  learning and decrease stress on the pelvic floor with functional activity.    Baseline Pt. demonstrates severe posterior pelvic tilt, L thoracic/R lumbar scoliosis, and R anterior/L posterior pelvic obliquity. 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 Will be able to cath without pain and will be cleared by physiscian to d/c self-cath due to decreased residual volume with bladder emptying.    Baseline required to cath 3 times per day, painful each time.    Time 10    Period Weeks    Status Achieved      PT LONG TERM GOAL #4   Title Patient will score less than or equal to 40% on the Female NIH-CPSI and 30% on the [facility] to demonstrate a reduction in pain, urinary symptoms, and an improved quality of life.    Baseline Female NIH-CPSI: 38/43 (88%) , PDI: 45/75 (60%); PDI 44/75 (59%) and Female NIH-CPSI: 27/43 (64%) on 02-08-19 , Female NIH-CPSI: 17% on 03-26-19  Time 10    Period Weeks    Status Achieved      PT LONG TERM GOAL #5   Title Patient will demonstrate increased step length  with reciprical arm swing, and demonstrate no scissoring steps.    Baseline Pt currently demonstrated scissoring steppage on R LE, increased trunk rotation and deep core engagement with reciprical walking poles.    Time 10    Period Weeks    Status Achieved      PT LONG TERM GOAL #6   Title Pt will increase her distance on the 6MWT from 1079  feet  to  > 1320 feet  order to progress with aerobic exercises and walking longer distances in the community  ( 5/10: 1000 ft,  8/3: )     Time 8    Period Weeks    Status Revised      PT LONG TERM GOAL #7   Title Pt will report waking up > 2 mornings in a row with pain level < 5-6/10 in order to demo maintanence of Tx benefits and more balance of mm tensions achieved with HEP, shoe lift to improve QOL    Baseline 7-8/10 level and benefits from Tx lasted only one morning  (03-19-19 and 03-23-19)    Time 10    Period Weeks    Status Achieved      PT LONG TERM GOAL #8   Title Pt will be able to demo increased PF strength with unilateral UE support on wall from B 3/5 15 reps in order to achieve stronger push off in gait and walk longer distance    Baseline B, L 7 reps  2/5, 10 reps R 3/5  ( 12/17: 15 reps R , 13 reps L, 06/01/19: R 20 reps, L 18 reps  )    Time 10    Period Weeks    Status Achieved      PT LONG TERM GOAL  #9   TITLE Pt will demo shoulder abduction > 120 deg B with improved alignment of thorax over pelvis without manual cues in order to demo proper co-activation of B equal weight bearing in BLE, and decreased thoracic kyhposis/ decreased thoracic rotation 2/2 scoliosis in order to lift plates overhead cabinets/ decreased risk for    Time 10    Period Weeks    Status Achieved      PT LONG TERM GOAL  #10   TITLE Pt will demo increased height from 63" to > 63 1/2 " and maintain 63 1/2" across 3 weeks ( once a week measurement) and forward head posture improve ( decreased distance from wall to earlobe 17 cm from earlobe to wall to > 12  cm)  in order to be ready to progress to PNF exercise with less shoulder pain and lengthen/ strengthen concave curve on R 2/2 scoliosis to minimize L lateral thigh tightness  (07/06/19 : 63 3/4",  08/10/19: 64"   )    Time 8    Period Weeks    Status Achieved      PT LONG TERM GOAL  #11   TITLE Pt will demo single UE support with lunge position exercises/ yoga poses  in order to demo improved balance and IND with flexibility routine    Baseline BUE support    Time 10    Period Weeks    Status On-going      PT LONG TERM GOAL  #12   TITLE Pt will demo L  trunk rotation in R sidelying with zero report of pain in order to progress with longer walking endurance    Time 10    Period Weeks    Status Achieved      PT LONG TERM GOAL  #13   TITLE Pt will demo increased  shoulder abd R against wall in mini squat from 55 deg to > 75 deg in order to demo increased thoracic extension and intercostal lengthening for functional BUE mobility without compensations.  ( 4/2: 88 deg R )    Time 8    Period Weeks    Status Achieved      PT LONG TERM GOAL  #14   TITLE Pt will demo less forward head posture ( earlobe to wall <13  cm) nd maintain height of 64  1/4" for 1 month in order to stand taller w/ less kyphosis and less deviations to spine to walk longer distances ( 11/20/19 : 10.5 cm)      Baseline ( earlobe to wall 16 cm)  ( 64  1/14" on 09/14/19  )     Time 10    Period Weeks    Status Achieved      PT LONG TERM GOAL  #15   TITLE Pt will demo no  ore perineal scar restrictions nor pelvic floor mm tensions and be able to  perform proper coordination of pelvic floor and contraction 3sec, 3 rep in order to minimize leakage.     Time 10    Status Revised                 Plan - 01/10/20 1632    Clinical Impression Statement Pt's L radiating pain returned the past 2 weeks. Reproduction of radiating pain down to calf was reproduced with palpation at L SIJ where it is fused. Releasing tightness at  ITband,  hamstring/ adductor, adductor hallucis helped to centralize the pain to the knee. Assessment of LLE includes weakness for hip/knee flexion/ext and weakness of intrinsic foot mm with 2 toes limited in abduction. Pt also demo'd R trunk lean which has returned after a period of  decreasing in past sessions. Advised pt to use hiking poles more in community to minimize worsening of scoliosis.  Plan to strengthen LLE and foot at next session. Pt continues to benefit from skilled PT      Personal Factors and Comorbidities Age;Comorbidity 3+    Comorbidities Osteoporosis, scoliosis, Arthritis, COPD, recent lumbosacral fusion for spondylolisthesis.    Examination-Activity Limitations Toileting;Sit;Stand;Bend;Lift;Carry    Examination-Participation Restrictions Interpersonal Relationship;Yard Work;Cleaning;Laundry    Stability/Clinical Decision Making Unstable/Unpredictable    Rehab Potential Good    PT Frequency 2x / week    PT Duration Other (comment)   20   PT Treatment/Interventions ADLs/Self Care Home Management;Aquatic Therapy;Moist Heat;Electrical Stimulation;Traction;Therapeutic activities;Functional mobility training;Stair training;Gait training;Therapeutic exercise;Balance training;Neuromuscular re-education;Patient/family education;Manual techniques;Dry needling;Passive range of motion;Scar mobilization;Taping    Consulted and Agree with Plan of Care Patient           Patient will benefit from skilled therapeutic intervention in order to improve the following deficits and impairments:  Abnormal gait, Decreased balance, Increased muscle spasms, Decreased range of motion, Decreased scar mobility, Improper body mechanics, Decreased coordination, Decreased strength, Increased fascial restricitons, Impaired flexibility, Postural dysfunction, Pain  Visit Diagnosis: Other muscle spasm  Other idiopathic scoliosis, thoracolumbar region  Abnormal posture     Problem List Patient  Active Problem List   Diagnosis Date Noted  . Family history of  breast cancer 11/16/2019  . Chronic UTI 12/06/2018  . Status post lumbar surgery 12/06/2018  . Spondylolisthesis, lumbar region 09/26/2018  . Aortic atherosclerosis ([place]) 08/31/2018  . CAD (coronary artery disease) 02/23/2018  . Advanced care planning/counseling discussion 03/28/2017  . Varicose veins of both lower extremities with complications 17/51/0258  . Arthritis 12/09/2016  . Vaginal atrophy 09/10/2015  . Vasomotor symptoms due to menopause 09/10/2015  . Onychomycosis due to dermatophyte 04/07/2015  . Bursitis of right shoulder 02/26/2015  . Environmental and seasonal allergies 02/26/2015  . Other allergic rhinitis 02/26/2015  . Osteoporosis 11/26/2014  . COPD (chronic obstructive pulmonary disease) ([place]) 11/26/2014  . Fuchs' corneal dystrophy 11/26/2014  . Benign hypertensive renal disease 11/26/2014  . Hyperlipidemia 11/26/2014  . Chronic kidney disease, stage III (moderate) ([place]) 11/26/2014  . Hypothyroidism 11/26/2014  . Endothelial corneal dystrophy 11/26/2014    Jerl Mina ,PT, DPT, E-RYT  01/10/2020, 4:50 PM  [place] MAIN [facility] SERVICES [address] [place], [place], [address] Phone: [phone]   Fax:  [phone]  Name: Julia Deleon MRN: [id number] Date of Birth: 1945-10-24

## 2020-01-15 ENCOUNTER — Other Ambulatory Visit: Payer: Self-pay

## 2020-01-15 ENCOUNTER — Ambulatory Visit: Payer: Medicare Other | Admitting: Physical Therapy

## 2020-01-15 DIAGNOSIS — R293 Abnormal posture: Secondary | ICD-10-CM | POA: Diagnosis not present

## 2020-01-15 DIAGNOSIS — G8929 Other chronic pain: Secondary | ICD-10-CM | POA: Diagnosis not present

## 2020-01-15 DIAGNOSIS — M62838 Other muscle spasm: Secondary | ICD-10-CM

## 2020-01-15 DIAGNOSIS — M25511 Pain in right shoulder: Secondary | ICD-10-CM | POA: Diagnosis not present

## 2020-01-15 DIAGNOSIS — M4125 Other idiopathic scoliosis, thoracolumbar region: Secondary | ICD-10-CM | POA: Diagnosis not present

## 2020-01-15 NOTE — Therapy (Addendum)
[place] MAIN [facility] SERVICES [address] [place], [place], [address] Phone: [phone]   Fax:  [phone]  Physical Therapy Treatment  Patient Details  Name: Julia Deleon MRN: [id number] Date of Birth: 1946-03-09 No data recorded  Encounter Date: 01/15/2020   PT End of Session - 01/15/20 1718    Visit Number 111    Date for PT Re-Evaluation 01/29/20   Progress note to report  (last on #97  11/20/19 )   Authorization Type Medicare    PT Start Time 0900    PT Stop Time 1010    PT Time Calculation (min) 70 min    Activity Tolerance Patient tolerated treatment well;No increased pain    Behavior During Therapy WFL for tasks assessed/performed           Past Medical History:  Diagnosis Date  . Arthritis   . COPD (chronic obstructive pulmonary disease) ([place])   . Family history of adverse reaction to anesthesia    father was slow to wake up  . Fuch's endothelial dystrophy   . Hyperlipidemia   . Hypothyroidism   . PONV (postoperative nausea and vomiting)    slow to wake up and PONV    Past Surgical History:  Procedure Laterality Date  . APPENDECTOMY    . BACK SURGERY  09/26/2018   L4-5 PLIF by Dr. Arnoldo Morale  . CARDIAC CATHETERIZATION    . CHOLECYSTECTOMY    . EYE SURGERY    . FOOT SURGERY Right    pin removed left  . laser vein surgery    . NASAL SINUS SURGERY    . RIGHT/LEFT HEART CATH AND CORONARY ANGIOGRAPHY N/A 12/06/2017   Procedure: RIGHT/LEFT HEART CATH AND CORONARY ANGIOGRAPHY;  Surgeon: Yolonda Kida, MD;  Location: [place] [facility];  Service: Cardiovascular;  Laterality: N/A;  . TONSILLECTOMY      There were no vitals filed for this visit.   Subjective Assessment - 01/15/20 0907    Subjective Pt reported she has had a new pain since the last 2 weeks. It hurts at the L knee and it radiates up and down from the outside of the knee. It hurts in all positions , sitting and standing, stairs, in and out of the car. It  comes on after walking 44min. This pain is near the same areas where she has had a pain prior to her back surgery 2020 (L spinal fusion of L4/L5/S1).  But this pain has intensified and feel different. She can do exercises at the gym and it loosens it ( leg lift machine)  but other exercises tighten it back up ( heel raises) .   Pt has not noted increased swelling around the knee. Pain at the L whole knee ( both inside and outside of knee) 8-10/10 on Wednesday last week. Pt has not noticed the radiating pain from the L low back to the knee pain because she has been focused on the knee where it has been worse.                          Pelvic Floor Special Questions - 01/15/20 0959    Prolapse Anterior Wall   standing, inside introitus, downward push w/ contraction            [facility] Adult PT Treatment/Exercise - 01/15/20 1720      Therapeutic Activites    Other Therapeutic Activities discussed POC, reassessed goals, d/c for  knee assessment by orthopedist, scheduled appt with Dr. Candelaria Stagers       Neuro Re-ed    Neuro Re-ed Details  cued for upward contraciton and less ab straining in standing position with pelvic floor                      PT Short Term Goals - 06/08/19 1133      PT SHORT TERM GOAL #1   Title Patient will demonstrate improved pelvic alignment and balance of musculature surrounding the pelvis to facilitate decreased PFM spasms and decrease pelvic pain.    Baseline Pt. demonstrates severe posterior pelvic tilt, L thoracic/R lumbar scoliosis, and R anterior/L posterior pelvic obliquity. Pt. has demonstrated improved pelvic alignment and posture but has not been able to sustain long-term due to continued lack of postural strength.    Time 5    Period Weeks    Status Achieved    Target Date 03/06/19      PT SHORT TERM GOAL #2   Title Patient will demonstrate a coordinated contraction, relaxation, and bulge of the pelvic floor muscles to demonstrate  functional recruitment and motion and allow for bladder emptying.    Baseline Pt. is unable to fully empty bladder and describes pain with catheterization, demonstrating high tone of PFM.    Time 5    Period Weeks    Status Achieved    Target Date 03/06/19      PT SHORT TERM GOAL #3   Title Patient will demonstrate improved sitting and standing posture to demonstrate learning and decrease stress on the pelvic floor with functional activity.    Baseline Pt. demonstrates severe posterior pelvic tilt, L thoracic/R lumbar scoliosis, and R anterior/L posterior pelvic obliquity. 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 Will be able to cath without pain and will be cleared by physiscian to d/c self-cath due to decreased residual volume with bladder emptying.    Baseline required to cath 3 times per day, painful each time.    Time 10    Period Weeks    Status Achieved      PT LONG TERM GOAL #4  Title Patient will score less than or equal to 40% on the Female NIH-CPSI and 30% on the [facility]  to demonstrate a reduction in pain, urinary symptoms, and an improved quality of life.    Baseline Female NIH-CPSI: 38/43 (88%) , PDI: 45/75 (60%); PDI 44/75 (59%) and Female NIH-CPSI: 27/43 (64%) on 02-08-19 , Female NIH-CPSI: 17% on 03-26-19    Time 10    Period Weeks    Status Achieved      PT LONG TERM GOAL #5   Title Patient will demonstrate increased step length with reciprical arm swing, and demonstrate no scissoring steps.    Baseline Pt currently demonstrated scissoring steppage on R LE, increased trunk rotation and deep core engagement with reciprical walking poles.    Time 10    Period Weeks    Status Achieved      PT LONG TERM GOAL #6   Title Pt will increase her distance on the 6MWT from 1079  feet  to  > 1320 feet  order to progress with aerobic exercises and walking longer distances in the community  ( 5/10: 1000 ft,  8/3: )     Time 8    Period Weeks    Status Revised      PT LONG TERM GOAL #7   Title Pt will report waking up > 2 mornings in a row with pain level < 5-6/10 in order to demo maintanence of Tx benefits and more balance of mm tensions achieved with HEP, shoe lift to improve QOL    Baseline 7-8/10 level and benefits from Tx lasted only one morning  (03-19-19 and 03-23-19)    Time 10    Period Weeks    Status Achieved      PT LONG TERM GOAL #8   Title Pt will be able to demo increased PF strength with unilateral UE support on wall from B 3/5 15 reps in order to achieve stronger push off in gait and walk longer distance    Baseline B, L 7 reps  2/5, 10 reps R 3/5  ( 12/17: 15 reps R , 13 reps L, 06/01/19: R 20 reps, L 18 reps  )    Time 10    Period Weeks    Status Achieved      PT LONG TERM GOAL  #9   TITLE Pt will demo shoulder abduction > 120 deg B with improved alignment of thorax over pelvis without manual cues in order to demo proper co-activation of B equal weight bearing in BLE, and decreased thoracic kyhposis/ decreased thoracic rotation 2/2 scoliosis  in order to lift plates overhead cabinets/ decreased risk for    Time 10    Period Weeks    Status Achieved      PT LONG TERM GOAL  #10   TITLE Pt will demo increased height from 63" to > 63 1/2 " and maintain 63 1/2" across 3 weeks ( once a week measurement) and forward head posture improve ( decreased distance from wall to earlobe 17 cm from earlobe to wall to > 12 cm)  in order to be ready to progress to PNF exercise with less shoulder pain and lengthen/ strengthen concave curve on R 2/2 scoliosis to minimize L lateral thigh tightness  (07/06/19 : 63 3/4",  08/10/19: 64"   )    Time 8    Period Weeks    Status Achieved      PT LONG TERM GOAL  #11  TITLE Pt will demo single UE support with lunge position exercises/ yoga poses  in order to demo improved balance and IND with flexibility routine    Baseline BUE support    Time 10    Period Weeks    Status On-going      PT LONG TERM GOAL  #12   TITLE Pt will demo L trunk rotation in R sidelying with zero report of pain in order to progress with longer walking endurance    Time 10    Period Weeks    Status Achieved      PT LONG TERM GOAL  #13   TITLE Pt will demo increased  shoulder abd R against wall in mini squat from 55 deg to > 75 deg in order to demo increased thoracic extension and intercostal lengthening for functional BUE mobility without compensations.  ( 4/2: 88 deg R )    Time 8    Period Weeks    Status Achieved      PT LONG TERM GOAL  #14   TITLE Pt will demo less forward head posture ( earlobe to wall <13  cm) nd maintain height of 64  1/4" for 1 month in order to stand taller w/ less kyphosis and less deviations to spine to walk longer distances ( 11/20/19 : 10.5 cm)      Baseline ( earlobe to wall 16 cm)  ( 64  1/14" on 09/14/19  )     Time 10    Period Weeks    Status Achieved      PT LONG TERM GOAL  #15   TITLE Pt will demo no  ore perineal scar restrictions nor pelvic floor mm tensions and be able to  perform proper  coordination of pelvic floor and contraction 3sec, 3 rep in order to minimize leakage.     Time 10    Status Revised                 Plan - 01/15/20 1719    Clinical Impression Statement Pt demo'd improved coordination with pelvic floor and deep core in standing position. Pt required excessive cues to minimize downward forces onto pelvic floor and minimize prolapse.   Discussed POC and potential d/c at next session with need to refer out to orthopedist for new knee complaints on LLE.    Provided active listening and encouragement as pt was tearful and expressed frustration with increased knee pain and recent prolapse episodes. Communicated with Dr. Candelaria Stagers office to get pt an appt for an orthopedic consult.   Provided explanation for increased and maintained use of hiking poles to minimize R trunk lean to minimize worsening of scoliosis and pulling of L IT band.   Advised pt to go to PCP / Urgent Care to r/o DVT. Pt voiced understanding.  Plan to d/c at next session.      Personal Factors and Comorbidities Age;Comorbidity 3+    Comorbidities Osteoporosis, scoliosis, Arthritis, COPD, recent lumbosacral fusion for spondylolisthesis.    Examination-Activity Limitations Toileting;Sit;Stand;Bend;Lift;Carry    Examination-Participation Restrictions Interpersonal Relationship;Yard Work;Cleaning;Laundry    Stability/Clinical Decision Making Unstable/Unpredictable    Rehab Potential Good    PT Frequency 2x / week    PT Duration Other (comment)   20   PT Treatment/Interventions ADLs/Self Care Home Management;Aquatic Therapy;Moist Heat;Electrical Stimulation;Traction;Therapeutic activities;Functional mobility training;Stair training;Gait training;Therapeutic exercise;Balance training;Neuromuscular re-education;Patient/family education;Manual techniques;Dry needling;Passive range of motion;Scar mobilization;Taping    Consulted and Agree with Plan of Care Patient  Patient will  benefit from skilled therapeutic intervention in order to improve the following deficits and impairments:  Abnormal gait, Decreased balance, Increased muscle spasms, Decreased range of motion, Decreased scar mobility, Improper body mechanics, Decreased coordination, Decreased strength, Increased fascial restricitons, Impaired flexibility, Postural dysfunction, Pain  Visit Diagnosis: Other muscle spasm  Other idiopathic scoliosis, thoracolumbar region  Abnormal posture  Chronic right shoulder pain     Problem List Patient Active Problem List   Diagnosis Date Noted  . Family history of breast cancer 11/16/2019  . Chronic UTI 12/06/2018  . Status post lumbar surgery 12/06/2018  . Spondylolisthesis, lumbar region 09/26/2018  . Aortic atherosclerosis ([place]) 08/31/2018  . CAD (coronary artery disease) 02/23/2018  . Advanced care planning/counseling discussion 03/28/2017  . Varicose veins of both lower extremities with complications 29/56/2130  . Arthritis 12/09/2016  . Vaginal atrophy 09/10/2015  . Vasomotor symptoms due to menopause 09/10/2015  . Onychomycosis due to dermatophyte 04/07/2015  . Bursitis of right shoulder 02/26/2015  . Environmental and seasonal allergies 02/26/2015  . Other allergic rhinitis 02/26/2015  . Osteoporosis 11/26/2014  . COPD (chronic obstructive pulmonary disease) ([place]) 11/26/2014  . Fuchs' corneal dystrophy 11/26/2014  . Benign hypertensive renal disease 11/26/2014  . Hyperlipidemia 11/26/2014  . Chronic kidney disease, stage III (moderate) ([place]) 11/26/2014  . Hypothyroidism 11/26/2014  . Endothelial corneal dystrophy 11/26/2014    Jerl Mina ,PT, DPT, E-RYT  01/15/2020, 5:23 PM  [place] MAIN [facility] SERVICES [address] [place], [place], [address] Phone: [phone]   Fax:  [phone]  Name: Julia Deleon MRN: [id number] Date of Birth: [birth year]

## 2020-01-16 ENCOUNTER — Ambulatory Visit (INDEPENDENT_AMBULATORY_CARE_PROVIDER_SITE_OTHER): Payer: Medicare Other | Admitting: Dermatology

## 2020-01-16 ENCOUNTER — Other Ambulatory Visit: Payer: Self-pay

## 2020-01-16 DIAGNOSIS — L918 Other hypertrophic disorders of the skin: Secondary | ICD-10-CM

## 2020-01-16 DIAGNOSIS — Z1283 Encounter for screening for malignant neoplasm of skin: Secondary | ICD-10-CM

## 2020-01-16 DIAGNOSIS — D692 Other nonthrombocytopenic purpura: Secondary | ICD-10-CM

## 2020-01-16 DIAGNOSIS — L814 Other melanin hyperpigmentation: Secondary | ICD-10-CM | POA: Diagnosis not present

## 2020-01-16 DIAGNOSIS — I8310 Varicose veins of unspecified lower extremity with inflammation: Secondary | ICD-10-CM

## 2020-01-16 DIAGNOSIS — L708 Other acne: Secondary | ICD-10-CM

## 2020-01-16 DIAGNOSIS — L578 Other skin changes due to chronic exposure to nonionizing radiation: Secondary | ICD-10-CM

## 2020-01-16 DIAGNOSIS — L821 Other seborrheic keratosis: Secondary | ICD-10-CM | POA: Diagnosis not present

## 2020-01-16 DIAGNOSIS — L719 Rosacea, unspecified: Secondary | ICD-10-CM | POA: Diagnosis not present

## 2020-01-16 DIAGNOSIS — D1800 Hemangioma unspecified site: Secondary | ICD-10-CM

## 2020-01-16 DIAGNOSIS — D229 Melanocytic nevi, unspecified: Secondary | ICD-10-CM

## 2020-01-16 DIAGNOSIS — D239 Other benign neoplasm of skin, unspecified: Secondary | ICD-10-CM

## 2020-01-16 NOTE — Progress Notes (Signed)
   Follow-Up Visit   Subjective  Julia Deleon is a 74 y.o. female who presents for the following: Annual Exam (Spot under right breast present for years, asymptomatic.) and Growths (chest, inframammary. Present for years.).  More spots have come up over past year, since her back surgery and use of new medication for frequent UTIs.   The following portions of the chart were reviewed this encounter and updated as appropriate:      Review of Systems:  No other skin or systemic complaints except as noted in HPI or Assessment and Plan.  Objective  Well appearing patient in no apparent distress; mood and affect are within normal limits.  A full examination was performed including scalp, head, eyes, ears, nose, lips, neck, chest, axillae, abdomen, back, buttocks, bilateral upper extremities, bilateral lower extremities, hands, feet, fingers, toes, fingernails, and toenails. All findings within normal limits unless otherwise noted below.  Objective  Left Inframammary: Dilated pore.  Objective  Malar cheeks, nose, chin: Telangiectasias.   Assessment & Plan   Skin cancer screening performed today.  Actinic Damage - diffuse scaly erythematous macules with underlying dyspigmentation - Recommend daily broad spectrum sunscreen SPF 30+ to sun-exposed areas, reapply every 2 hours as needed.  - Call for new or changing lesions.   Seborrheic Keratoses - Stuck-on, waxy, tan-brown papules and plaques, including inframammary area - Discussed benign etiology and prognosis. - Observe - Call for any changes, discussed LN2 if irritated   Lentigines - Scattered tan macules - Discussed due to sun exposure - Benign, observe - Call for any changes  Hemangiomas - Red papules - Discussed benign nature - Observe - Call for any changes  Melanocytic Nevi - Tan-brown and/or pink-flesh-colored symmetric macules and papules - Benign appearing on exam today - Observation - Call clinic for new or  changing moles - Recommend daily use of broad spectrum spf 30+ sunscreen to sun-exposed areas.   Purpura - Violaceous macules and patches - Benign - Related to age, sun damage and/or use of blood thinners - Observe - Can use OTC arnica containing moisturizer such as Dermend Bruise Formula if desired - Call for worsening or other concerns  Varicose Veins - Dilated blue, purple or red veins at the lower extremities - Reassured - These can be treated by sclerotherapy (a procedure to inject a medicine into the veins to make them disappear) if desired, but the treatment is not covered by insurance  Acrochordons (Skin Tags) - Fleshy, skin-colored pedunculated papules upper abdomen - Benign appearing.  - Observe. - If desired, they can be removed with an in office procedure that may not be covered by insurance. - Please call the clinic if you notice any new or changing lesions.   Dilated pore of [place] Left Inframammary  Benign, Observe. Discussed punch excision if bothersome. Asymptomatic. Patient will observe for now.  Rosacea Malar cheeks, nose, chin  Mild, no Rx treatment at this time Recommend daily broad spectrum sunscreen SPF 30+ daily  Return in about 1 year (around 01/15/2021) for TBSE.  IJamesetta Orleans, CMA, am acting as scribe for Brendolyn Patty, MD .  Documentation: I have reviewed the above documentation for accuracy and completeness, and I agree with the above.  Brendolyn Patty MD

## 2020-01-16 NOTE — Patient Instructions (Signed)
Seborrheic Keratosis  What causes seborrheic keratoses? Seborrheic keratoses are harmless, common skin growths that first appear during adult life.  As time goes by, more growths appear.  Some people may develop a large number of them.  Seborrheic keratoses appear on both covered and uncovered body parts.  They are not caused by sunlight.  The tendency to develop seborrheic keratoses can be inherited.  They vary in color from skin-colored to gray, brown, or even black.  They can be either smooth or have a rough, warty surface.   Seborrheic keratoses are superficial and look as if they were stuck on the skin.  Under the microscope this type of keratosis looks like layers upon layers of skin.  That is why at times the top layer may seem to fall off, but the rest of the growth remains and re-grows.    Treatment Seborrheic keratoses do not need to be treated, but can easily be removed in the office.  Seborrheic keratoses often cause symptoms when they rub on clothing or jewelry.  Lesions can be in the way of shaving.  If they become inflamed, they can cause itching, soreness, or burning.  Removal of a seborrheic keratosis can be accomplished by freezing, burning, or surgery. If any spot bleeds, scabs, or grows rapidly, please return to have it checked, as these can be an indication of a skin cancer. 

## 2020-01-18 ENCOUNTER — Ambulatory Visit: Payer: Medicare Other | Admitting: Physical Therapy

## 2020-01-18 DIAGNOSIS — J301 Allergic rhinitis due to pollen: Secondary | ICD-10-CM | POA: Diagnosis not present

## 2020-01-22 ENCOUNTER — Emergency Department
Admission: EM | Admit: 2020-01-22 | Discharge: 2020-01-22 | Disposition: A | Discharge status: Home / Self Care | Payer: Medicare Other | Attending: Emergency Medicine | Admitting: Emergency Medicine

## 2020-01-22 ENCOUNTER — Emergency Department: Payer: Medicare Other

## 2020-01-22 ENCOUNTER — Other Ambulatory Visit: Payer: Self-pay

## 2020-01-22 ENCOUNTER — Ambulatory Visit: Payer: Medicare Other | Admitting: Physical Therapy

## 2020-01-22 DIAGNOSIS — R293 Abnormal posture: Secondary | ICD-10-CM

## 2020-01-22 DIAGNOSIS — E039 Hypothyroidism, unspecified: Secondary | ICD-10-CM | POA: Insufficient documentation

## 2020-01-22 DIAGNOSIS — N183 Chronic kidney disease, stage 3 unspecified: Secondary | ICD-10-CM | POA: Insufficient documentation

## 2020-01-22 DIAGNOSIS — M4125 Other idiopathic scoliosis, thoracolumbar region: Secondary | ICD-10-CM

## 2020-01-22 DIAGNOSIS — M79605 Pain in left leg: Secondary | ICD-10-CM

## 2020-01-22 DIAGNOSIS — M7122 Synovial cyst of popliteal space [Baker], left knee: Secondary | ICD-10-CM | POA: Diagnosis not present

## 2020-01-22 DIAGNOSIS — J449 Chronic obstructive pulmonary disease, unspecified: Secondary | ICD-10-CM | POA: Diagnosis not present

## 2020-01-22 DIAGNOSIS — I2510 Atherosclerotic heart disease of native coronary artery without angina pectoris: Secondary | ICD-10-CM | POA: Diagnosis not present

## 2020-01-22 DIAGNOSIS — M62838 Other muscle spasm: Secondary | ICD-10-CM

## 2020-01-22 DIAGNOSIS — M79606 Pain in leg, unspecified: Secondary | ICD-10-CM | POA: Diagnosis present

## 2020-01-22 DIAGNOSIS — G8929 Other chronic pain: Secondary | ICD-10-CM | POA: Diagnosis not present

## 2020-01-22 DIAGNOSIS — M1712 Unilateral primary osteoarthritis, left knee: Secondary | ICD-10-CM | POA: Diagnosis not present

## 2020-01-22 DIAGNOSIS — M25562 Pain in left knee: Secondary | ICD-10-CM | POA: Diagnosis not present

## 2020-01-22 NOTE — ED Provider Notes (Signed)
[facility] Emergency Department Provider Note   ____________________________________________   First MD Initiated Contact with Patient 01/22/20 1036     (approximate)  I have reviewed the triage vital signs and the nursing notes.   HISTORY  Chief Complaint Leg Pain    HPI Julia Deleon is a 74 y.o. female patient was sent by PCP ultrasound to rule out DVT.  Patient has left leg pain for several weeks.  Patient the pain has become worse today.  Patient denies chest pain or shortness of breath.         Past Medical History:  Diagnosis Date  . Arthritis   . COPD (chronic obstructive pulmonary disease) ([place])   . Family history of adverse reaction to anesthesia    father was slow to wake up  . Fuch's endothelial dystrophy   . Hyperlipidemia   . Hypothyroidism   . PONV (postoperative nausea and vomiting)    slow to wake up and PONV    Patient Active Problem List   Diagnosis Date Noted  . Family history of breast cancer 11/16/2019  . Chronic UTI 12/06/2018  . Status post lumbar surgery 12/06/2018  . Spondylolisthesis, lumbar region 09/26/2018  . Aortic atherosclerosis ([place]) 08/31/2018  . CAD (coronary artery disease) 02/23/2018  . Advanced care planning/counseling discussion 03/28/2017  . Varicose veins of both lower extremities with complications 83/38/2505  . Arthritis 12/09/2016  . Vaginal atrophy 09/10/2015  . Vasomotor symptoms due to menopause 09/10/2015  . Onychomycosis due to dermatophyte 04/07/2015  . Bursitis of right shoulder 02/26/2015  . Environmental and seasonal allergies 02/26/2015  . Other allergic rhinitis 02/26/2015  . Osteoporosis 11/26/2014  . COPD (chronic obstructive pulmonary disease) ([place]) 11/26/2014  . Fuchs' corneal dystrophy 11/26/2014  . Benign hypertensive renal disease 11/26/2014  . Hyperlipidemia 11/26/2014  . Chronic kidney disease, stage III (moderate) ([place]) 11/26/2014  . Hypothyroidism 11/26/2014  .  Endothelial corneal dystrophy 11/26/2014    Past Surgical History:  Procedure Laterality Date  . APPENDECTOMY    . BACK SURGERY  09/26/2018   L4-5 PLIF by Dr. Arnoldo Morale  . CARDIAC CATHETERIZATION    . CHOLECYSTECTOMY    . EYE SURGERY    . FOOT SURGERY Right    pin removed left  . laser vein surgery    . NASAL SINUS SURGERY    . RIGHT/LEFT HEART CATH AND CORONARY ANGIOGRAPHY N/A 12/06/2017   Procedure: RIGHT/LEFT HEART CATH AND CORONARY ANGIOGRAPHY;  Surgeon: Yolonda Kida, MD;  Location: [facility];  Service: Cardiovascular;  Laterality: N/A;  . TONSILLECTOMY      Prior to Admission medications   Medication Sig Start Date End Date Taking? Authorizing Provider  acetaminophen (TYLENOL) 650 MG CR tablet Take 650-1,300 mg by mouth every 8 (eight) hours as needed for pain.     [provider]  Alpha-Lipoic Acid 100 MG TABS Take 100 mg by mouth daily with lunch.    [provider]  azelastine (ASTELIN) 0.1 % nasal spray Place 1 spray into both nostrils daily. Use in each nostril as directed    [provider]  chlorzoxazone (PARAFON) 500 MG tablet TAKE ONE TABLET FOUR TIMES DAILY AS NEEDED FOR MUSCLE SPASM 12/30/19   Park Liter P, DO  Cholecalciferol (VITAMIN D3) 2000 units TABS Take 2,000 Units by mouth daily.     [provider]  clotrimazole-betamethasone (LOTRISONE) cream Apply externally BID prn sx up to 2 wks 3/97/67   Copland, Deirdre Evener,  PA-C  Coenzyme Q10 (COQ-10) 100 MG CAPS Take 100 mg by mouth daily.    [provider]  CRANBERRY PO Take 1 capsule by mouth daily.    [provider]  Cyanocobalamin (B-12-SL) 1000 MCG SUBL Place 500 mcg under the tongue 2 (two) times a day.    [provider]  D-MANNOSE PO Take by mouth.    [provider]  DHEA 10 MG CAPS Take 15 mg by mouth daily.     [provider]  EPINEPHrine 0.3 mg/0.3 mL IJ SOAJ injection  01/19/19   [provider]    Estriol POWD INSERT 0.5GM 2-3 TIMES WEEKLY AS NEEDED 08/23/19   Gae Dry, MD  fexofenadine (ALLEGRA) 60 MG tablet Take 60 mg by mouth daily.    [provider]  glucosamine-chondroitin 500-400 MG tablet Take 1 tablet by mouth 3 (three) times daily.    [provider]  levothyroxine (SYNTHROID) 75 MCG tablet TAKE ONE TABLET EACH MORNING BEFORE BREAKFAST 09/30/19   Johnson, Megan P, DO  losartan (COZAAR) 100 MG tablet Take 1 tablet (100 mg total) by mouth daily. 09/27/19   Park Liter P, DO  Multiple Vitamin (MULTIVITAMIN WITH MINERALS) TABS tablet Take 1 tablet by mouth every evening.    [provider]  Multiple Vitamins-Minerals (LUTEIN-ZEAXANTHIN PO) Take 1 tablet by mouth at bedtime.    [provider]  Probiotic CAPS Take 1 capsule by mouth at bedtime.    [provider]  Progesterone Micronized (PROGESTERONE, BULK,) POWD Apply 1 application topically See admin instructions. 4% compound- topically 11/13/18   Gae Dry, MD  triamcinolone (NASACORT ALLERGY 24HR) 55 MCG/ACT AERO nasal inhaler Place 2 sprays daily into the nose. 03/28/17   Guadalupe Maple, MD  TURMERIC PO Take 1 tablet by mouth 2 (two) times a week.    [provider]    Allergies Colesevelam, Levofloxacin, Benazepril, Methylisothiazolinone, Thimerosal, Amoxicillin-pot clavulanate, Latex, and Simvastatin  Family History  Problem Relation Age of Onset  . Cancer Mother        lung  . CAD Mother   . Stroke Father   . Breast cancer Sister        68s  . Varicose Veins Son   . Heart disease Maternal Grandmother   . Cancer Paternal Grandmother        throat  . Emphysema Paternal Grandfather   . Fibromyalgia Sister   . Brain cancer Sister   . Breast cancer Other     Social History Social History   Tobacco Use  . Smoking status: Never Smoker  . Smokeless tobacco: Never Used  Vaping Use  . Vaping Use: Never used  Substance Use Topics  . Alcohol  use: No    Alcohol/week: 0.0 standard drinks  . Drug use: No    Review of Systems  Constitutional: No fever/chills Eyes: No visual changes. ENT: No sore throat. Cardiovascular: Denies chest pain. Respiratory: Denies shortness of breath. Gastrointestinal: No abdominal pain.  No nausea, no vomiting.  No diarrhea.  No constipation. Genitourinary: Negative for dysuria. Musculoskeletal: Left leg pain. Skin: Negative for rash. Neurological: Negative for headaches, focal weakness or numbness. Endocrine:  Hyperlipidemia and hypothyroidism. Hematological/Lymphatic:  Allergic/Immunilogical: Extensive medical allergy list. ____________________________________________   PHYSICAL EXAM:  VITAL SIGNS: ED Triage Vitals  Enc Vitals Group     BP 01/22/20 0947 (!) 145/52     Pulse Rate 01/22/20 0947 (!) 57     Resp 01/22/20 0947 18  Temp 01/22/20 0947 97.7 F (36.5 C)     Temp Source 01/22/20 0947 Oral     SpO2 01/22/20 0947 98 %     Weight 01/22/20 0948 157 lb (71.2 kg)     Height 01/22/20 0948 5\' 4"  (1.626 m)     Head Circumference --      Peak Flow --      Pain Score 01/22/20 0955 8     Pain Loc --      Pain Edu? --      Excl. in [place]? --    Constitutional: Alert and oriented. Well appearing and in no acute distress. Hematological/Lymphatic/Immunilogical: No cervical lymphadenopathy. Cardiovascular: Normal rate, regular rhythm. Grossly normal heart sounds.  Good peripheral circulation. Respiratory: Normal respiratory effort.  No retractions. Lungs CTAB. Gastrointestinal: Soft and nontender. No distention. No abdominal bruits. No CVA tenderness. Musculoskeletal: No obvious left leg deformity.  Patient has moderate guarding palpation the left popliteal area.  No joint effusions. Neurologic:  Normal speech and language. No gross focal neurologic deficits are appreciated. No gait instability. Skin:  Skin is warm, dry and intact. No rash noted. Psychiatric: Mood and affect are normal.  Speech and behavior are normal.  ____________________________________________   LABS (all labs ordered are listed, but only abnormal results are displayed)  Labs Reviewed - No data to display ____________________________________________  EKG   ____________________________________________  RADIOLOGY  ED MD interpretation:    Official radiology report(s): US Venous Img Lower Unilateral Left (DVT)  Result Date: 01/22/2020 CLINICAL DATA:  Left leg pain EXAM: LEFT LOWER EXTREMITY VENOUS DOPPLER ULTRASOUND TECHNIQUE: Gray-scale sonography with compression, as well as color and duplex ultrasound, were performed to evaluate the deep venous system(s) from the level of the common femoral vein through the popliteal and proximal calf veins. COMPARISON:  None. FINDINGS: VENOUS Normal compressibility of the common femoral, superficial femoral, and popliteal veins, as well as the visualized calf veins. Visualized portions of profunda femoral vein and great saphenous vein unremarkable. No filling defects to suggest DVT on grayscale or color Doppler imaging. Doppler waveforms show normal direction of venous flow, normal respiratory plasticity and response to augmentation. Limited views of the contralateral common femoral vein are unremarkable. OTHER 3.4 x 3.1 x 0.8 cm cyst in the popliteal fossa. Limitations: none IMPRESSION: No evidence of left lower extremity DVT. Left Baker's cyst. Electronically Signed   By: Rolm Baptise M.D.   On: 01/22/2020 11:17    ____________________________________________   PROCEDURES  Procedure(s) performed (including Critical Care):  Procedures   ____________________________________________   INITIAL IMPRESSION / ASSESSMENT AND PLAN / ED COURSE  As part of my medical decision making, I reviewed the following data within the [place]     Patient presents with several weeks of left popliteal pain.  Ultrasound reveals no DVT but a popliteal cyst.   Patient given discharge care instruction follow-up PCP.          ____________________________________________   FINAL CLINICAL IMPRESSION(S) / ED DIAGNOSES  Final diagnoses:  Baker's cyst of knee, left     ED Discharge Orders    None      *Please note:  Julia Deleon was evaluated in Emergency Department on 01/22/2020 for the symptoms described in the history of present illness. She was evaluated in the context of the global COVID-19 pandemic, which necessitated consideration that the patient might be at risk for infection with the SARS-CoV-2 virus that causes COVID-19. Institutional protocols and algorithms that pertain to  the evaluation of patients at risk for COVID-19 are in a state of rapid change based on information released by regulatory bodies including the CDC and federal and state organizations. These policies and algorithms were followed during the patient's care in the ED.  Some ED evaluations and interventions may be delayed as a result of limited staffing during and the pandemic.*   Note:  This document was prepared using Dragon voice recognition software and may include unintentional dictation errors.    Sable Feil, PA-C 01/22/20 1149    Naaman Plummer, MD 01/22/20 [phone]

## 2020-01-22 NOTE — ED Notes (Signed)
Pt with pain radiating down left leg from knee to ankle. +2 DP. Calf soft, no redness noted.

## 2020-01-22 NOTE — ED Triage Notes (Signed)
Pt here with left leg pain for weeks but is here to be evaluated for a DVT from her provider. PT states pain has become worse today. Pt NAD in triage.

## 2020-01-22 NOTE — Discharge Instructions (Addendum)
Your ultrasound was negative for DVT.  Findings are consistent with a Baker's cyst.  A copy of the report and at this of the ultrasound imaging will be provided for follow-up care.

## 2020-01-23 DIAGNOSIS — J301 Allergic rhinitis due to pollen: Secondary | ICD-10-CM | POA: Diagnosis not present

## 2020-01-23 NOTE — Therapy (Signed)
[place] MAIN [facility] SERVICES [address] [place], [place], [address] Phone: [phone]   Fax:  [phone]  Physical Therapy Treatment  Patient Details  Name: Julia Deleon MRN: [id number] Date of Birth: Dec 27, 1945 No data recorded  Encounter Date: 01/22/2020   PT End of Session - 01/22/20 1711    Visit Number [id number]    Date for PT Re-Evaluation 01/29/20   Progress note to report  (last on #97  11/20/19 )   Authorization Type Medicare    PT Start Time 0900    PT Stop Time 0950    PT Time Calculation (min) 50 min    Activity Tolerance Patient tolerated treatment well;No increased pain    Behavior During Therapy WFL for tasks assessed/performed           Past Medical History:  Diagnosis Date  . Arthritis   . COPD (chronic obstructive pulmonary disease) ([place])   . Family history of adverse reaction to anesthesia    father was slow to wake up  . Fuch's endothelial dystrophy   . Hyperlipidemia   . Hypothyroidism   . PONV (postoperative nausea and vomiting)    slow to wake up and PONV    Past Surgical History:  Procedure Laterality Date  . APPENDECTOMY    . BACK SURGERY  09/26/2018   L4-5 PLIF by Dr. Arnoldo Morale  . CARDIAC CATHETERIZATION    . CHOLECYSTECTOMY    . EYE SURGERY    . FOOT SURGERY Right    pin removed left  . laser vein surgery    . NASAL SINUS SURGERY    . RIGHT/LEFT HEART CATH AND CORONARY ANGIOGRAPHY N/A 12/06/2017   Procedure: RIGHT/LEFT HEART CATH AND CORONARY ANGIOGRAPHY;  Surgeon: Yolonda Kida, MD;  Location: [place] [facility];  Service: Cardiovascular;  Laterality: N/A;  . TONSILLECTOMY      There were no vitals filed for this visit.   Subjective Assessment - 01/22/20 0911    Subjective Pt reported the L knee has gotten worse and it feels tighter and she is not able to extend it. It hurts when wshe puts weight on it, climbing stairs. Denied N/T,.  Pt reports radiating pain behind the back of the knee.  Pt  reported she consulted an previous PCP as recommended to r/o DVT. Pt was told by this provider that he did not think it was DVT. Pt has a scheduled appt today with orthopedist Dr. Candelaria Stagers.              [facility] PT Assessment - 01/23/20 0937      Palpation   Palpation comment L calf tightness compared to R        Ambulation/Gait   Gait Comments antalgic gait                          [facility] Adult PT Treatment/Exercise - 01/23/20 7793      Therapeutic Activites    Other Therapeutic Activities communicated with Dr. Candelaria Stagers office: inquired if they can perform [place] to r/o DVT,  discussed the importance of r/o DVT with pt given her Hx of DVT and venous insufficiency, and current Sx, triaged pt from Urgent Care to ER, communicated with ER staff.                     PT Short Term Goals - 06/08/19 1133      PT SHORT TERM GOAL #  1   Title Patient will demonstrate improved pelvic alignment and balance of musculature surrounding the pelvis to facilitate decreased PFM spasms and decrease pelvic pain.    Baseline Pt. demonstrates severe posterior pelvic tilt, L thoracic/R lumbar scoliosis, and R anterior/L posterior pelvic obliquity. Pt. has demonstrated improved pelvic alignment and posture but has not been able to sustain long-term due to continued lack of postural strength.    Time 5    Period Weeks    Status Achieved    Target Date 03/06/19      PT SHORT TERM GOAL #2   Title Patient will demonstrate a coordinated contraction, relaxation, and bulge of the pelvic floor muscles to demonstrate functional recruitment and motion and allow for bladder emptying.    Baseline Pt. is unable to fully empty bladder and describes pain with catheterization, demonstrating high tone of PFM.    Time 5    Period Weeks    Status Achieved    Target Date 03/06/19      PT SHORT TERM GOAL #3   Title Patient will demonstrate improved sitting and standing posture to demonstrate learning and  decrease stress on the pelvic floor with functional activity.    Baseline Pt. demonstrates severe posterior pelvic tilt, L thoracic/R lumbar scoliosis, and R anterior/L posterior pelvic obliquity. 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 Will be able to cath without pain and will be cleared by physiscian to d/c self-cath due to decreased residual volume with bladder emptying.    Baseline required to cath 3 times per day, painful each time.    Time 10    Period Weeks    Status Achieved      PT LONG TERM GOAL #4   Title Patient will score less than or equal to 40% on the Female NIH-CPSI and 30% on the [facility] to demonstrate a reduction in pain, urinary symptoms, and an improved quality of life.    Baseline Female NIH-CPSI: 38/43 (88%) , PDI: 45/75 (60%); PDI 44/75 (59%) and Female NIH-CPSI: 27/43 (64%) on 02-08-19 , Female NIH-CPSI: 17% on 03-26-19    Time 10    Period Weeks    Status  Achieved      PT LONG TERM GOAL #5   Title Patient will demonstrate increased step length with  reciprical arm swing, and demonstrate no scissoring steps.    Baseline Pt currently demonstrated scissoring steppage on R LE, increased trunk rotation and deep core engagement with reciprical walking poles.    Time 10    Period Weeks    Status Achieved      PT LONG TERM GOAL #6   Title Pt will increase her distance on the 6MWT from 1079  feet  to  > 1320 feet  order to progress with aerobic exercises and walking longer distances in the community  ( 5/10: 1000 ft,  8/3: )     Time 8    Period Weeks    Status Revised      PT LONG TERM GOAL #7   Title Pt will report waking up > 2 mornings in a row with pain level < 5-6/10 in order to demo maintanence of Tx benefits and more balance of mm tensions achieved with HEP, shoe lift to improve QOL    Baseline 7-8/10 level and benefits from Tx lasted only one morning  (03-19-19 and 03-23-19)    Time 10    Period Weeks    Status Achieved      PT LONG TERM GOAL #8   Title Pt will be able to demo increased PF strength with unilateral UE support on wall from B 3/5 15 reps in order to achieve stronger push off in gait and walk longer distance    Baseline B, L 7 reps  2/5, 10 reps R 3/5  ( 12/17: 15 reps R , 13 reps L, 06/01/19: R 20 reps, L 18 reps  )    Time 10    Period Weeks    Status Achieved      PT LONG TERM GOAL  #9   TITLE Pt will demo shoulder abduction > 120 deg B with improved alignment of thorax over pelvis without manual cues in order to demo proper co-activation of B equal weight bearing in BLE, and decreased thoracic kyhposis/ decreased thoracic rotation 2/2 scoliosis in order to lift plates overhead cabinets/ decreased risk for    Time 10    Period Weeks    Status Achieved      PT LONG TERM GOAL  #10   TITLE Pt will demo increased height from 63" to > 63 1/2 " and maintain 63 1/2" across 3 weeks ( once a week measurement) and forward head posture improve ( decreased distance from wall to earlobe 17 cm from earlobe to wall to > 12 cm)   in order to be ready to progress to PNF exercise with less shoulder pain and lengthen/ strengthen concave curve on R 2/2 scoliosis to minimize L lateral thigh tightness  (07/06/19 : 63 3/4",  08/10/19: 64"   )    Time 8    Period Weeks    Status Achieved      PT LONG TERM GOAL  #11   TITLE Pt will demo single UE support with lunge position exercises/ yoga poses  in order to demo improved balance and IND with flexibility routine    Baseline BUE support    Time 10    Period Weeks    Status On-going      PT LONG TERM GOAL  #12   TITLE Pt will demo L trunk rotation in R sidelying with zero report of pain in  order to progress with longer walking endurance    Time 10    Period Weeks    Status Achieved      PT LONG TERM GOAL  #13   TITLE Pt will demo increased  shoulder abd R against wall in mini squat from 55 deg to > 75 deg in order to demo increased thoracic extension and intercostal lengthening for functional BUE mobility without compensations.  ( 4/2: 88 deg R )    Time 8    Period Weeks    Status Achieved      PT LONG TERM GOAL  #14   TITLE Pt will demo less forward head posture ( earlobe to wall <13  cm) nd maintain height of 64  1/4" for 1 month in order to stand taller w/ less kyphosis and less deviations to spine to walk longer distances ( 11/20/19 : 10.5 cm)      Baseline ( earlobe to wall 16 cm)  ( 64  1/14" on 09/14/19  )     Time 10    Period Weeks    Status Achieved      PT LONG TERM GOAL  #15   TITLE Pt will demo no  ore perineal scar restrictions nor pelvic floor mm tensions and be able to  perform proper coordination of pelvic floor and contraction 3sec, 3 rep in order to minimize leakage.     Time 10    Status Revised                 Plan - 01/23/20 0940    Clinical Impression Statement Given pt's report of worsening of her L knee pain, focused today on helping pt get f/u medical attention.  Therapist communicated with Dr. Candelaria Stagers office, the orthopedist pt was  referred to for this acute knee pain as pt has an appointment this afternoon with him. Therapist inquired if they can perform [place] to r/o DVT and they can not.  Discussed the importance of r/o DVT with pt given her Hx of DVT and venous insufficiency, and current Sx and urged pt to go to Urgent Care today.  Escorted pt to Urgent Care in [facility] but URgent Care staff stated they also can not perform [place] for DVT and thus , escorted pt to ER, communicated with ER staff. Pt was left with ER staff.    Plan to f/u with pt and providers on the status of her acute knee pain and plan to d/c from current POC at next session so pt can begin a new POC for her acute knee pain.      Personal Factors and Comorbidities Age;Comorbidity 3+    Comorbidities Osteoporosis, scoliosis, Arthritis, COPD, recent lumbosacral fusion for spondylolisthesis.    Examination-Activity Limitations Toileting;Sit;Stand;Bend;Lift;Carry    Examination-Participation Restrictions Interpersonal Relationship;Yard Work;Cleaning;Laundry    Stability/Clinical Decision Making Unstable/Unpredictable    Rehab Potential Good    PT Frequency 2x / week    PT Duration Other (comment)   20   PT Treatment/Interventions ADLs/Self Care Home Management;Aquatic Therapy;Moist Heat;Electrical Stimulation;Traction;Therapeutic activities;Functional mobility training;Stair training;Gait training;Therapeutic exercise;Balance training;Neuromuscular re-education;Patient/family education;Manual techniques;Dry needling;Passive range of motion;Scar mobilization;Taping    Consulted and Agree with Plan of Care Patient           Patient will benefit from skilled therapeutic intervention in order to improve the following deficits and impairments:  Abnormal gait, Decreased balance, Increased muscle spasms, Decreased range of motion, Decreased scar mobility, Improper body mechanics, Decreased coordination, Decreased strength, Increased  fascial restricitons, Impaired flexibility,  Postural dysfunction, Pain  Visit Diagnosis: Other muscle spasm  Other idiopathic scoliosis, thoracolumbar region  Abnormal posture  Chronic right shoulder pain     Problem List Patient Active Problem List   Diagnosis Date Noted  . Family history of breast cancer 11/16/2019  . Chronic UTI 12/06/2018  . Status post lumbar surgery 12/06/2018  . Spondylolisthesis, lumbar region 09/26/2018  . Aortic atherosclerosis ([place]) 08/31/2018  . CAD (coronary artery disease) 02/23/2018  . Advanced care planning/counseling discussion 03/28/2017  . Varicose veins of both lower extremities with complications 79/89/2119  . Arthritis 12/09/2016  . Vaginal atrophy 09/10/2015  . Vasomotor symptoms due to menopause 09/10/2015  . Onychomycosis due to dermatophyte 04/07/2015  . Bursitis of right shoulder 02/26/2015  . Environmental and seasonal allergies 02/26/2015  . Other allergic rhinitis 02/26/2015  . Osteoporosis 11/26/2014  . COPD (chronic obstructive pulmonary disease) ([place]) 11/26/2014  . Fuchs' corneal dystrophy 11/26/2014  . Benign hypertensive renal disease 11/26/2014  . Hyperlipidemia 11/26/2014  . Chronic kidney disease, stage III (moderate) ([place]) 11/26/2014  . Hypothyroidism 11/26/2014  . Endothelial corneal dystrophy 11/26/2014    Jerl Mina ,PT, DPT, E-RYT  01/23/2020, 9:41 AM  [place] MAIN [facility] SERVICES [address] [place], [place], [address] Phone: [phone]   Fax:  [phone]  Name: Julia Deleon MRN: [id number] Date of Birth: 12/27/1945

## 2020-01-29 ENCOUNTER — Ambulatory Visit: Payer: Medicare Other | Admitting: Physical Therapy

## 2020-01-29 ENCOUNTER — Other Ambulatory Visit: Payer: Self-pay

## 2020-01-29 DIAGNOSIS — R293 Abnormal posture: Secondary | ICD-10-CM

## 2020-01-29 DIAGNOSIS — G8929 Other chronic pain: Secondary | ICD-10-CM | POA: Diagnosis not present

## 2020-01-29 DIAGNOSIS — M62838 Other muscle spasm: Secondary | ICD-10-CM

## 2020-01-29 DIAGNOSIS — M4125 Other idiopathic scoliosis, thoracolumbar region: Secondary | ICD-10-CM | POA: Diagnosis not present

## 2020-01-29 DIAGNOSIS — M25511 Pain in right shoulder: Secondary | ICD-10-CM

## 2020-01-29 NOTE — Therapy (Addendum)
[place] MAIN [facility] SERVICES [address] [place], [place], [address] Phone: [phone]   Fax:  [phone]  Physical Therapy Treatment / Discharge Summary  Patient Details  Name: Julia Deleon MRN: [id number] Date of Birth: 1945-10-01 No data recorded  Encounter Date: 01/29/2020   PT End of Session - 01/29/20 0912    Visit Number 113    Date for PT Re-Evaluation 01/29/20   Progress note to report  (last on #97  11/20/19 )   Authorization Type Medicare    PT Start Time 0907    PT Stop Time 1000    PT Time Calculation (min) 53 min    Activity Tolerance Patient tolerated treatment well;No increased pain    Behavior During Therapy WFL for tasks assessed/performed           Past Medical History:  Diagnosis Date  . Arthritis   . COPD (chronic obstructive pulmonary disease) ([place])   . Family history of adverse reaction to anesthesia    father was slow to wake up  . Fuch's endothelial dystrophy   . Hyperlipidemia   . Hypothyroidism   . PONV (postoperative nausea and vomiting)    slow to wake up and PONV    Past Surgical History:  Procedure Laterality Date  . APPENDECTOMY    . BACK SURGERY  09/26/2018   L4-5 PLIF by Dr. Arnoldo Morale  . CARDIAC CATHETERIZATION    . CHOLECYSTECTOMY    . EYE SURGERY    . FOOT SURGERY Right    pin removed left  . laser vein surgery    . NASAL SINUS SURGERY    . RIGHT/LEFT HEART CATH AND CORONARY ANGIOGRAPHY N/A 12/06/2017   Procedure: RIGHT/LEFT HEART CATH AND CORONARY ANGIOGRAPHY;  Surgeon: Yolonda Kida, MD;  Location: [facility];  Service: Cardiovascular;  Laterality: N/A;  . TONSILLECTOMY      There were no vitals filed for this visit.   Subjective Assessment - 01/29/20 0912    Subjective Pt reported her ED visit from last week helped her to find that there was a Baker's cyst and no DVT. Pt was able to make it to ther orthopedic consult with Dr. Candelaria Stagers who drained her cyst and now she  is moving much better and her knee extension is back. Pt also learned that her L knee is bone on bone for 1/3 of her knee.                                       PT Short Term Goals - 06/08/19 1133      PT SHORT TERM GOAL #1   Title Patient will demonstrate improved pelvic alignment and balance of musculature surrounding the pelvis to facilitate decreased PFM spasms and decrease pelvic pain.    Baseline Pt. demonstrates severe posterior pelvic tilt, L thoracic/R lumbar scoliosis, and R anterior/L posterior pelvic obliquity. Pt. has demonstrated improved pelvic alignment and posture but has not been able to sustain long-term due to continued lack of postural strength.    Time 5    Period Weeks    Status Achieved    Target Date 03/06/19      PT SHORT TERM GOAL #2   Title Patient will demonstrate a coordinated contraction, relaxation, and bulge of the pelvic floor muscles to demonstrate functional recruitment and motion and allow for bladder emptying.  Baseline Pt. is unable to fully empty bladder and describes pain with catheterization, demonstrating high tone of PFM.    Time 5    Period Weeks    Status Achieved    Target Date 03/06/19      PT SHORT TERM GOAL #3   Title Patient will demonstrate improved sitting and standing posture to demonstrate learning and decrease stress on the pelvic floor with functional activity.    Baseline Pt. demonstrates severe posterior pelvic tilt, L thoracic/R lumbar scoliosis, and R anterior/L posterior pelvic obliquity. 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 Will be able to cath without pain and will be cleared by physiscian to d/c self-cath due to decreased residual volume with bladder emptying.    Baseline required to cath 3 times per day, painful each time.    Time 10    Period Weeks    Status Achieved      PT LONG TERM GOAL #4   Title Patient will score less than or equal to 40% on the Female NIH-CPSI and 30% on the [facility] to demonstrate a reduction in pain, urinary symptoms, and an improved quality of life.    Baseline Female NIH-CPSI: 38/43 (88%) , PDI: 45/75 (60%); PDI 44/75 (59%) and Female NIH-CPSI: 27/43 (64%) on 02-08-19 , Female NIH-CPSI: 17% on 03-26-19    Time 10    Period Weeks    Status Achieved      PT LONG TERM GOAL #5   Title Patient will demonstrate increased step length with reciprical arm swing, and demonstrate no scissoring steps.    Baseline Pt currently demonstrated scissoring steppage on R LE, increased trunk rotation and deep core engagement with reciprical walking poles.    Time 10    Period Weeks    Status Achieved      PT LONG TERM GOAL #6   Title Pt will increase her distance on the 6MWT from 1079  feet  to  > 1320 feet  order to progress with aerobic exercises and walking longer distances in the community  ( 5/10: 1000 ft,  8/3: 1530 ft 9 min with poles  )  Time 8    Period Weeks    Status Achieved      PT LONG TERM GOAL #7   Title Pt will report waking up > 2  mornings in a row with pain level < 5-6/10 in order to demo maintanence of Tx benefits and more balance of mm tensions achieved with HEP, shoe lift to improve QOL    Baseline 7-8/10 level and benefits from Tx lasted only one morning  (03-19-19 and 03-23-19)    Time 10    Period Weeks    Status Achieved      PT LONG TERM GOAL #8   Title Pt will be able to demo increased PF strength with unilateral UE support on wall from B 3/5 15 reps in order to achieve stronger push off in gait and walk longer distance    Baseline B, L 7 reps  2/5, 10 reps R 3/5  ( 12/17: 15 reps R , 13 reps L, 06/01/19: R 20 reps, L 18 reps  )    Time 10    Period Weeks    Status Achieved      PT LONG TERM GOAL  #9   TITLE Pt will demo shoulder abduction > 120 deg B with improved alignment of thorax over pelvis without manual cues in order to demo proper co-activation of B equal weight bearing in BLE, and decreased thoracic kyhposis/ decreased thoracic rotation 2/2 scoliosis in order to lift plates overhead cabinets/ decreased risk for    Time 10    Period Weeks    Status Achieved      PT LONG TERM GOAL  #10   TITLE Pt will demo increased height from 63" to > 63 1/2 " and maintain 63 1/2" across 3 weeks ( once a week measurement) and forward head posture improve ( decreased distance from wall to earlobe 17 cm from earlobe to wall to > 12 cm)  in order to be ready to progress to PNF exercise with less shoulder pain and lengthen/ strengthen concave curve on R 2/2 scoliosis to minimize L lateral thigh tightness  (07/06/19 : 63 3/4",  08/10/19: 64"   )    Time 8    Period Weeks    Status Achieved      PT LONG TERM GOAL  #11   TITLE Pt will demo single UE support with lunge position exercises/ yoga poses  in order to demo improved balance and IND with flexibility routine    Baseline BUE support    Time 10    Period Weeks    Status Partially Met      PT LONG TERM GOAL  #12   TITLE Pt will demo L trunk rotation in R sidelying  with zero report of pain in order to progress with longer walking endurance    Time 10    Period Weeks    Status Achieved      PT LONG TERM GOAL  #13   TITLE Pt will demo increased  shoulder abd R against wall in mini squat from 55 deg to > 75 deg in order to demo increased thoracic extension and intercostal lengthening for functional BUE mobility without compensations.  ( 4/2: 88 deg R )    Time 8    Period Weeks    Status Achieved      PT LONG TERM GOAL  #14   TITLE Pt will demo less forward head posture ( earlobe to wall <13  cm) nd  maintain height of 64  1/4" for 1 month in order to stand taller w/ less kyphosis and less deviations to spine to walk longer distances ( 11/20/19 : 10.5 cm)      Baseline ( earlobe to wall 16 cm)  ( 64  1/14" on 09/14/19  )     Time 10    Period Weeks    Status Achieved      PT LONG TERM GOAL  #15   TITLE Pt will demo no  more perineal scar restrictions nor pelvic floor mm tensions and be able to  perform proper coordination of pelvic floor and contraction 3sec, 3 rep in order to minimize leakage.     Time 10    Status Achieved                 Plan - 01/29/20 0912    Clinical Impression Statement Across 14 months/ 113 visits, pt has made significant improvements with restored pelvic floor function with initiating urination, improved pelvic floor coordination, less mm tensions, and increased pelvic floor strength. Her NIH CPSI score decreased from 40% to 17% which reflects these improvements. Pt demo'd IND with pelvic floor exercises for strengthening and minimizing worsening of prolapse.   Pt has also made significantly improved L thigh/ hip pain which has enabled her to wake up in the morning with less pain, sleep longer throughout the night, and walk longer distances.  Orthopedic improvements include: restored SIJ and spinal mobility, less flexed coccyx, increased height of 1/2", increased hip/ plantarflexion strength, improved propioception, body  awareness with proper technique to her gym routines, improved IND with flexibility HEP.   Her scoliosis, thoracic kyphosis, and lower kinetic chain deficits were addressed with great emphasis across this year which will help with long-term benefits and minimize risk for further injuries.   Pt recently had an onset of a Baker's cyst with DVT ruled out after pt was escorted to ER. Pt consulted an orthopedist for the Baker's cyst and received Tx for it which has made an improvement to knee pain. Pt was recommended to f/u with another PT to address the rehab for the Baker's cyst to minimize relapse with walking and gait as pt has made such significant improvements.   Pt was educated on maintaining HEP, flexibility program, and not to overdo with activities, allow for self-care time and rest for management of pain, OA, and minimizing tightness of muscles.    Pt is ready for d/c at this time.     Personal Factors and Comorbidities Age;Comorbidity 3+    Comorbidities Osteoporosis, scoliosis, Arthritis, COPD, recent lumbosacral fusion for spondylolisthesis.    Examination-Activity Limitations Toileting;Sit;Stand;Bend;Lift;Carry    Examination-Participation Restrictions Interpersonal Relationship;Yard Work;Cleaning;Laundry    Stability/Clinical Decision Making Unstable/Unpredictable    Rehab Potential Good    PT Frequency 2x / week    PT Duration Other (comment)   20   PT Treatment/Interventions ADLs/Self Care Home Management;Aquatic Therapy;Moist Heat;Electrical Stimulation;Traction;Therapeutic activities;Functional mobility training;Stair training;Gait training;Therapeutic exercise;Balance training;Neuromuscular re-education;Patient/family education;Manual techniques;Dry needling;Passive range of motion;Scar mobilization;Taping    Consulted and Agree with Plan of Care Patient           Patient will benefit from skilled therapeutic intervention in order to improve the following deficits and  impairments:  Abnormal gait, Decreased balance, Increased muscle spasms, Decreased range of motion, Decreased scar mobility, Improper body mechanics, Decreased coordination, Decreased strength, Increased fascial restricitons, Impaired flexibility, Postural dysfunction, Pain  Visit Diagnosis: Other muscle spasm  Other idiopathic scoliosis,  thoracolumbar region  Abnormal posture  Chronic right shoulder pain     Problem List Patient Active Problem List   Diagnosis Date Noted  . Family history of breast cancer 11/16/2019  . Chronic UTI 12/06/2018  . Status post lumbar surgery 12/06/2018  . Spondylolisthesis, lumbar region 09/26/2018  . Aortic atherosclerosis ([place]) 08/31/2018  . CAD (coronary artery disease) 02/23/2018  . Advanced care planning/counseling discussion 03/28/2017  . Varicose veins of both lower extremities with complications 52/71/2929  . Arthritis 12/09/2016  . Vaginal atrophy 09/10/2015  . Vasomotor symptoms due to menopause 09/10/2015  . Onychomycosis due to dermatophyte 04/07/2015  . Bursitis of right shoulder 02/26/2015  . Environmental and seasonal allergies 02/26/2015  . Other allergic rhinitis 02/26/2015  . Osteoporosis 11/26/2014  . COPD (chronic obstructive pulmonary disease) ([place]) 11/26/2014  . Fuchs' corneal dystrophy 11/26/2014  . Benign hypertensive renal disease 11/26/2014  . Hyperlipidemia 11/26/2014  . Chronic kidney disease, stage III (moderate) ([place]) 11/26/2014  . Hypothyroidism 11/26/2014  . Endothelial corneal dystrophy 11/26/2014    Jerl Mina ,PT, DPT, E-RYT  01/29/2020, 5:05 PM  [place] MAIN [facility] SERVICES [address] [place], [place], [address] Phone: [phone]   Fax:  [phone]  Name: Julia Deleon MRN: [id number] Date of Birth: 1945/07/19

## 2020-02-01 ENCOUNTER — Ambulatory Visit: Payer: Medicare Other | Admitting: Physical Therapy

## 2020-02-05 ENCOUNTER — Ambulatory Visit: Payer: Medicare Other | Admitting: Physical Therapy

## 2020-02-07 DIAGNOSIS — Z23 Encounter for immunization: Secondary | ICD-10-CM | POA: Diagnosis not present

## 2020-02-08 ENCOUNTER — Ambulatory Visit: Payer: Medicare Other | Admitting: Physical Therapy

## 2020-02-12 ENCOUNTER — Encounter: Payer: Medicare Other | Admitting: Physical Therapy

## 2020-02-14 DIAGNOSIS — M25562 Pain in left knee: Secondary | ICD-10-CM | POA: Diagnosis not present

## 2020-02-14 DIAGNOSIS — M1712 Unilateral primary osteoarthritis, left knee: Secondary | ICD-10-CM | POA: Diagnosis not present

## 2020-02-14 DIAGNOSIS — G8929 Other chronic pain: Secondary | ICD-10-CM | POA: Diagnosis not present

## 2020-02-14 DIAGNOSIS — M7122 Synovial cyst of popliteal space [Baker], left knee: Secondary | ICD-10-CM | POA: Diagnosis not present

## 2020-02-15 ENCOUNTER — Encounter: Payer: Medicare Other | Admitting: Physical Therapy

## 2020-02-18 ENCOUNTER — Telehealth: Payer: Self-pay

## 2020-02-18 NOTE — Telephone Encounter (Signed)
Copied from [place] [id number]. Topic: General - Other >> Feb 15, 2020  2:34 PM Keene Breath wrote: Reason for CRM: Patient would like to speak with the nurse regarding her medication for chlorzoxazone (PARAFON) 500 MG tablet.  She stated she needs to talk about having to call every time she needs a refill.  Please call patient at [phone]

## 2020-02-18 NOTE — Telephone Encounter (Signed)
Called and spoke to patient. Patient asked if she can have a refill on the Parafon and have additional refills added on so she does not have to call every time. Current RX is written for 1 tablet 4 times daily as needed. Asked patient if this is how she is taking it and patient stated that she takes 1/2 tablet in the AM, 1/2 tablet at lunch and 1 tablet at bedtime. Routing to provider to advise. Uses [facility] for the pharmacy.

## 2020-02-19 ENCOUNTER — Encounter: Payer: Medicare Other | Admitting: Physical Therapy

## 2020-02-19 ENCOUNTER — Ambulatory Visit: Payer: Medicare Other

## 2020-02-20 DIAGNOSIS — M1712 Unilateral primary osteoarthritis, left knee: Secondary | ICD-10-CM | POA: Diagnosis not present

## 2020-02-20 DIAGNOSIS — M7122 Synovial cyst of popliteal space [Baker], left knee: Secondary | ICD-10-CM | POA: Diagnosis not present

## 2020-02-20 DIAGNOSIS — G8929 Other chronic pain: Secondary | ICD-10-CM | POA: Diagnosis not present

## 2020-02-20 DIAGNOSIS — M25462 Effusion, left knee: Secondary | ICD-10-CM | POA: Diagnosis not present

## 2020-02-20 DIAGNOSIS — M25562 Pain in left knee: Secondary | ICD-10-CM | POA: Diagnosis not present

## 2020-02-22 ENCOUNTER — Encounter: Payer: Medicare Other | Admitting: Physical Therapy

## 2020-02-26 ENCOUNTER — Encounter: Payer: Medicare Other | Admitting: Physical Therapy

## 2020-02-27 DIAGNOSIS — M1712 Unilateral primary osteoarthritis, left knee: Secondary | ICD-10-CM | POA: Diagnosis not present

## 2020-02-27 DIAGNOSIS — M25562 Pain in left knee: Secondary | ICD-10-CM | POA: Diagnosis not present

## 2020-02-27 DIAGNOSIS — G8929 Other chronic pain: Secondary | ICD-10-CM | POA: Diagnosis not present

## 2020-02-27 DIAGNOSIS — M25462 Effusion, left knee: Secondary | ICD-10-CM | POA: Diagnosis not present

## 2020-02-27 DIAGNOSIS — M7122 Synovial cyst of popliteal space [Baker], left knee: Secondary | ICD-10-CM | POA: Diagnosis not present

## 2020-02-28 ENCOUNTER — Other Ambulatory Visit: Payer: Self-pay | Admitting: Family Medicine

## 2020-02-28 ENCOUNTER — Encounter: Payer: Medicare Other | Admitting: Physical Therapy

## 2020-02-28 MED ORDER — CHLORZOXAZONE 500 MG PO TABS: 500.0000 mg | ORAL_TABLET | Freq: Four times a day (QID) | ORAL | 1 refills | Status: DC | PRN

## 2020-02-28 NOTE — Telephone Encounter (Signed)
Medication Refill - Medication: chlorzoxazone (PARAFON) 500 MG tablet [[id number]     Preferred Pharmacy (with phone number or street name):  [place], [place] - [address]  [place] [address]  Phone: [phone] Fax: [phone]  Hours: Not open 24 hours    Agent: Please be advised that RX refills may take up to 3 business days. We ask that you follow-up with your pharmacy.

## 2020-02-28 NOTE — Telephone Encounter (Signed)
Requested medication (s) are due for refill today: no  Requested medication (s) are on the active medication list: yes  Last refill: 12/20/2019  Future visit scheduled: yes  Notes to clinic: this refill cannot be delegated    Requested Prescriptions  Pending Prescriptions Disp Refills   chlorzoxazone (PARAFON) 500 MG tablet 90 tablet 0      Not Delegated - Analgesics:  Muscle Relaxants Failed - 02/28/2020  2:26 PM      Failed - This refill cannot be delegated      Passed - Valid encounter within last 6 months    Recent Outpatient Visits           5 months ago Other specified hypothyroidism   [place], Megan P, DO   9 months ago Acute lower UTI   [place], [place], [place]   10 months ago Recurrent UTI   [place], Megan P, DO   11 months ago Medicare annual wellness visit, subsequent   [employer], [place], DO   1 year ago Chronic UTI   [facility] [place], Jeannette How, MD       Future Appointments             In 1 month  [place], [place]   In 1 month [place], Megan P, DO [employer], [place]

## 2020-03-04 ENCOUNTER — Encounter: Payer: Medicare Other | Admitting: Physical Therapy

## 2020-03-05 DIAGNOSIS — M1712 Unilateral primary osteoarthritis, left knee: Secondary | ICD-10-CM | POA: Diagnosis not present

## 2020-03-05 DIAGNOSIS — M7122 Synovial cyst of popliteal space [Baker], left knee: Secondary | ICD-10-CM | POA: Diagnosis not present

## 2020-03-05 DIAGNOSIS — M25562 Pain in left knee: Secondary | ICD-10-CM | POA: Diagnosis not present

## 2020-03-05 DIAGNOSIS — G8929 Other chronic pain: Secondary | ICD-10-CM | POA: Diagnosis not present

## 2020-03-05 DIAGNOSIS — M25462 Effusion, left knee: Secondary | ICD-10-CM | POA: Diagnosis not present

## 2020-03-07 ENCOUNTER — Encounter: Payer: Medicare Other | Admitting: Physical Therapy

## 2020-03-11 ENCOUNTER — Encounter: Payer: Medicare Other | Admitting: Physical Therapy

## 2020-03-11 DIAGNOSIS — Z01818 Encounter for other preprocedural examination: Secondary | ICD-10-CM | POA: Diagnosis not present

## 2020-03-11 DIAGNOSIS — R0602 Shortness of breath: Secondary | ICD-10-CM | POA: Diagnosis not present

## 2020-03-14 ENCOUNTER — Encounter: Payer: Medicare Other | Admitting: Physical Therapy

## 2020-03-17 DIAGNOSIS — M8949 Other hypertrophic osteoarthropathy, multiple sites: Secondary | ICD-10-CM | POA: Diagnosis not present

## 2020-03-17 DIAGNOSIS — I878 Other specified disorders of veins: Secondary | ICD-10-CM | POA: Diagnosis not present

## 2020-03-17 DIAGNOSIS — G8929 Other chronic pain: Secondary | ICD-10-CM | POA: Diagnosis not present

## 2020-03-17 DIAGNOSIS — M25562 Pain in left knee: Secondary | ICD-10-CM | POA: Diagnosis not present

## 2020-03-17 DIAGNOSIS — M545 Low back pain, unspecified: Secondary | ICD-10-CM | POA: Diagnosis not present

## 2020-03-17 DIAGNOSIS — M25561 Pain in right knee: Secondary | ICD-10-CM | POA: Diagnosis not present

## 2020-03-18 ENCOUNTER — Encounter: Payer: Medicare Other | Admitting: Physical Therapy

## 2020-03-21 ENCOUNTER — Encounter: Payer: Medicare Other | Admitting: Physical Therapy

## 2020-03-24 DIAGNOSIS — G8929 Other chronic pain: Secondary | ICD-10-CM | POA: Diagnosis not present

## 2020-03-24 DIAGNOSIS — M5136 Other intervertebral disc degeneration, lumbar region: Secondary | ICD-10-CM | POA: Diagnosis not present

## 2020-03-24 DIAGNOSIS — M47816 Spondylosis without myelopathy or radiculopathy, lumbar region: Secondary | ICD-10-CM | POA: Diagnosis not present

## 2020-03-24 DIAGNOSIS — M7122 Synovial cyst of popliteal space [Baker], left knee: Secondary | ICD-10-CM | POA: Diagnosis not present

## 2020-03-24 DIAGNOSIS — M25462 Effusion, left knee: Secondary | ICD-10-CM | POA: Diagnosis not present

## 2020-03-24 DIAGNOSIS — M170 Bilateral primary osteoarthritis of knee: Secondary | ICD-10-CM | POA: Diagnosis not present

## 2020-03-24 DIAGNOSIS — M25562 Pain in left knee: Secondary | ICD-10-CM | POA: Diagnosis not present

## 2020-03-24 DIAGNOSIS — M25561 Pain in right knee: Secondary | ICD-10-CM | POA: Diagnosis not present

## 2020-03-25 ENCOUNTER — Encounter: Payer: Medicare Other | Admitting: Physical Therapy

## 2020-03-27 DIAGNOSIS — R600 Localized edema: Secondary | ICD-10-CM | POA: Diagnosis not present

## 2020-03-27 DIAGNOSIS — J439 Emphysema, unspecified: Secondary | ICD-10-CM | POA: Diagnosis not present

## 2020-03-27 DIAGNOSIS — Z23 Encounter for immunization: Secondary | ICD-10-CM | POA: Diagnosis not present

## 2020-03-27 DIAGNOSIS — I208 Other forms of angina pectoris: Secondary | ICD-10-CM | POA: Diagnosis not present

## 2020-03-27 DIAGNOSIS — I10 Essential (primary) hypertension: Secondary | ICD-10-CM | POA: Diagnosis not present

## 2020-03-27 DIAGNOSIS — R079 Chest pain, unspecified: Secondary | ICD-10-CM | POA: Diagnosis not present

## 2020-03-27 DIAGNOSIS — E6609 Other obesity due to excess calories: Secondary | ICD-10-CM | POA: Diagnosis not present

## 2020-03-27 DIAGNOSIS — I83893 Varicose veins of bilateral lower extremities with other complications: Secondary | ICD-10-CM | POA: Diagnosis not present

## 2020-03-27 DIAGNOSIS — N183 Chronic kidney disease, stage 3 unspecified: Secondary | ICD-10-CM | POA: Diagnosis not present

## 2020-03-27 DIAGNOSIS — E782 Mixed hyperlipidemia: Secondary | ICD-10-CM | POA: Diagnosis not present

## 2020-03-27 DIAGNOSIS — R0602 Shortness of breath: Secondary | ICD-10-CM | POA: Diagnosis not present

## 2020-03-27 DIAGNOSIS — R5383 Other fatigue: Secondary | ICD-10-CM | POA: Diagnosis not present

## 2020-03-28 ENCOUNTER — Encounter: Payer: Medicare Other | Admitting: Physical Therapy

## 2020-03-31 ENCOUNTER — Telehealth: Payer: Self-pay | Admitting: Family Medicine

## 2020-03-31 DIAGNOSIS — N816 Rectocele: Secondary | ICD-10-CM | POA: Diagnosis not present

## 2020-03-31 DIAGNOSIS — N814 Uterovaginal prolapse, unspecified: Secondary | ICD-10-CM | POA: Diagnosis not present

## 2020-03-31 NOTE — Telephone Encounter (Signed)
Copied from [place] [phone] [phone]. Topic: Medicare AWV >> Mar 31, 2020  2:27 PM Weston Anna wrote: Reason for CRM:  Left message to let patient know that AWVS on Apr 07, 2020 will be by phone not in the office Confirm telephone number to use

## 2020-04-01 ENCOUNTER — Encounter: Payer: Medicare Other | Admitting: Physical Therapy

## 2020-04-01 DIAGNOSIS — G8929 Other chronic pain: Secondary | ICD-10-CM | POA: Diagnosis not present

## 2020-04-01 DIAGNOSIS — M1711 Unilateral primary osteoarthritis, right knee: Secondary | ICD-10-CM | POA: Diagnosis not present

## 2020-04-01 DIAGNOSIS — N1831 Chronic kidney disease, stage 3a: Secondary | ICD-10-CM | POA: Diagnosis not present

## 2020-04-01 DIAGNOSIS — R338 Other retention of urine: Secondary | ICD-10-CM | POA: Diagnosis not present

## 2020-04-01 DIAGNOSIS — R600 Localized edema: Secondary | ICD-10-CM | POA: Diagnosis not present

## 2020-04-01 DIAGNOSIS — I10 Essential (primary) hypertension: Secondary | ICD-10-CM | POA: Diagnosis not present

## 2020-04-01 DIAGNOSIS — M25561 Pain in right knee: Secondary | ICD-10-CM | POA: Diagnosis not present

## 2020-04-07 ENCOUNTER — Other Ambulatory Visit: Payer: Self-pay

## 2020-04-07 ENCOUNTER — Ambulatory Visit (INDEPENDENT_AMBULATORY_CARE_PROVIDER_SITE_OTHER): Payer: Medicare Other | Admitting: Family Medicine

## 2020-04-07 ENCOUNTER — Ambulatory Visit (INDEPENDENT_AMBULATORY_CARE_PROVIDER_SITE_OTHER): Payer: Medicare Other

## 2020-04-07 ENCOUNTER — Encounter: Payer: Self-pay | Admitting: Family Medicine

## 2020-04-07 VITALS — Ht 64.0 in | Wt 158.0 lb

## 2020-04-07 VITALS — BP 133/66 | HR 55 | Temp 97.6°F | Ht 63.66 in | Wt 158.4 lb

## 2020-04-07 DIAGNOSIS — I129 Hypertensive chronic kidney disease with stage 1 through stage 4 chronic kidney disease, or unspecified chronic kidney disease: Secondary | ICD-10-CM | POA: Diagnosis not present

## 2020-04-07 DIAGNOSIS — N183 Chronic kidney disease, stage 3 unspecified: Secondary | ICD-10-CM

## 2020-04-07 DIAGNOSIS — Z0000 Encounter for general adult medical examination without abnormal findings: Secondary | ICD-10-CM | POA: Diagnosis not present

## 2020-04-07 DIAGNOSIS — E038 Other specified hypothyroidism: Secondary | ICD-10-CM | POA: Diagnosis not present

## 2020-04-07 DIAGNOSIS — Z23 Encounter for immunization: Secondary | ICD-10-CM

## 2020-04-07 DIAGNOSIS — E785 Hyperlipidemia, unspecified: Secondary | ICD-10-CM | POA: Diagnosis not present

## 2020-04-07 DIAGNOSIS — I10 Essential (primary) hypertension: Secondary | ICD-10-CM

## 2020-04-07 DIAGNOSIS — S81802A Unspecified open wound, left lower leg, initial encounter: Secondary | ICD-10-CM

## 2020-04-07 MED ORDER — LOSARTAN POTASSIUM 100 MG PO TABS: 100.0000 mg | ORAL_TABLET | Freq: Every day | ORAL | 1 refills | Status: DC

## 2020-04-07 NOTE — Patient Instructions (Signed)
Julia Deleon , Thank you for taking time to come for your Medicare Wellness Visit. I appreciate your ongoing commitment to your health goals. Please review the following plan we discussed and let me know if I can assist you in the future.   Screening recommendations/referrals: Colonoscopy: completed 11/10/2011, due 11/09/2021 Mammogram: completed 12/06/2019, due 12/05/2020 Bone Density: completed Recommended yearly ophthalmology/optometry visit for glaucoma screening and checkup Recommended yearly dental visit for hygiene and checkup  Vaccinations: Influenza vaccine: completed 02/07/2020, due 12/08/2020 Pneumococcal vaccine: completed 12/09/2016 Tdap vaccine: completed 07/21/2010, due 07/20/2020 Shingles vaccine: discussed   Covid-19: completed 07/02/2019, 06/03/2019  Advanced directives: Advance directive discussed with you today.    Conditions/risks identified: none  Next appointment: Follow up in one year for your annual wellness visit    Preventive Care 65 Years and Older, Female Preventive care refers to lifestyle choices and visits with your health care provider that can promote health and wellness. What does preventive care include?  A yearly physical exam. This is also called an annual well check.  Dental exams once or twice a year.  Routine eye exams. Ask your health care provider how often you should have your eyes checked.  Personal lifestyle choices, including:  Daily care of your teeth and gums.  Regular physical activity.  Eating a healthy diet.  Avoiding tobacco and drug use.  Limiting alcohol use.  Practicing safe sex.  Taking low-dose aspirin every day.  Taking vitamin and mineral supplements as recommended by your health care provider. What happens during an annual well check? The services and screenings done by your health care provider during your annual well check will depend on your age, overall health, lifestyle risk factors, and family history of  disease. Counseling  Your health care provider may ask you questions about your:  Alcohol use.  Tobacco use.  Drug use.  Emotional well-being.  Home and relationship well-being.  Sexual activity.  Eating habits.  History of falls.  Memory and ability to understand (cognition).  Work and work Statistician.  Reproductive health. Screening  You may have the following tests or measurements:  Height, weight, and BMI.  Blood pressure.  Lipid and cholesterol levels. These may be checked every 5 years, or more frequently if you are over 47 years old.  Skin check.  Lung cancer screening. You may have this screening every year starting at age 31 if you have a 30-pack-year history of smoking and currently smoke or have quit within the past 15 years.  Fecal occult blood test (FOBT) of the stool. You may have this test every year starting at age 59.  Flexible sigmoidoscopy or colonoscopy. You may have a sigmoidoscopy every 5 years or a colonoscopy every 10 years starting at age 68.  Hepatitis C blood test.  Hepatitis B blood test.  Sexually transmitted disease (STD) testing.  Diabetes screening. This is done by checking your blood sugar (glucose) after you have not eaten for a while (fasting). You may have this done every 1-3 years.  Bone density scan. This is done to screen for osteoporosis. You may have this done starting at age 81.  Mammogram. This may be done every 1-2 years. Talk to your health care provider about how often you should have regular mammograms. Talk with your health care provider about your test results, treatment options, and if necessary, the need for more tests. Vaccines  Your health care provider may recommend certain vaccines, such as:  Influenza vaccine. This is recommended every year.  Tetanus, diphtheria, and acellular pertussis (Tdap, Td) vaccine. You may need a Td booster every 10 years.  Zoster vaccine. You may need this after age  4.  Pneumococcal 13-valent conjugate (PCV13) vaccine. One dose is recommended after age 106.  Pneumococcal polysaccharide (PPSV23) vaccine. One dose is recommended after age 30. Talk to your health care provider about which screenings and vaccines you need and how often you need them. This information is not intended to replace advice given to you by your health care provider. Make sure you discuss any questions you have with your health care provider. Document Released: 05/23/2015 Document Revised: 01/14/2016 Document Reviewed: 02/25/2015 Elsevier Interactive Patient Education  2017 [employer] Prevention in the Home Falls can cause injuries. They can happen to people of all ages. There are many things you can do to make your home safe and to help prevent falls. What can I do on the outside of my home?  Regularly fix the edges of walkways and driveways and fix any cracks.  Remove anything that might make you trip as you walk through a door, such as a raised step or threshold.  Trim any bushes or trees on the path to your home.  Use bright outdoor lighting.  Clear any walking paths of anything that might make someone trip, such as rocks or tools.  Regularly check to see if handrails are loose or broken. Make sure that both sides of any steps have handrails.  Any raised decks and porches should have guardrails on the edges.  Have any leaves, snow, or ice cleared regularly.  Use sand or salt on walking paths during winter.  Clean up any spills in your garage right away. This includes oil or grease spills. What can I do in the bathroom?  Use night lights.  Install grab bars by the toilet and in the tub and shower. Do not use towel bars as grab bars.  Use non-skid mats or decals in the tub or shower.  If you need to sit down in the shower, use a plastic, non-slip stool.  Keep the floor dry. Clean up any water that spills on the floor as soon as it happens.  Remove  soap buildup in the tub or shower regularly.  Attach bath mats securely with double-sided non-slip rug tape.  Do not have throw rugs and other things on the floor that can make you trip. What can I do in the bedroom?  Use night lights.  Make sure that you have a light by your bed that is easy to reach.  Do not use any sheets or blankets that are too big for your bed. They should not hang down onto the floor.  Have a firm chair that has side arms. You can use this for support while you get dressed.  Do not have throw rugs and other things on the floor that can make you trip. What can I do in the kitchen?  Clean up any spills right away.  Avoid walking on wet floors.  Keep items that you use a lot in easy-to-reach places.  If you need to reach something above you, use a strong step stool that has a grab bar.  Keep electrical cords out of the way.  Do not use floor polish or wax that makes floors slippery. If you must use wax, use non-skid floor wax.  Do not have throw rugs and other things on the floor that can make you trip. What can I do  with my stairs?  Do not leave any items on the stairs.  Make sure that there are handrails on both sides of the stairs and use them. Fix handrails that are broken or loose. Make sure that handrails are as long as the stairways.  Check any carpeting to make sure that it is firmly attached to the stairs. Fix any carpet that is loose or worn.  Avoid having throw rugs at the top or bottom of the stairs. If you do have throw rugs, attach them to the floor with carpet tape.  Make sure that you have a light switch at the top of the stairs and the bottom of the stairs. If you do not have them, ask someone to add them for you. What else can I do to help prevent falls?  Wear shoes that:  Do not have high heels.  Have rubber bottoms.  Are comfortable and fit you well.  Are closed at the toe. Do not wear sandals.  If you use a  stepladder:  Make sure that it is fully opened. Do not climb a closed stepladder.  Make sure that both sides of the stepladder are locked into place.  Ask someone to hold it for you, if possible.  Clearly mark and make sure that you can see:  Any grab bars or handrails.  First and last steps.  Where the edge of each step is.  Use tools that help you move around (mobility aids) if they are needed. These include:  Canes.  Walkers.  Scooters.  Crutches.  Turn on the lights when you go into a dark area. Replace any light bulbs as soon as they burn out.  Set up your furniture so you have a clear path. Avoid moving your furniture around.  If any of your floors are uneven, fix them.  If there are any pets around you, be aware of where they are.  Review your medicines with your doctor. Some medicines can make you feel dizzy. This can increase your chance of falling. Ask your doctor what other things that you can do to help prevent falls. This information is not intended to replace advice given to you by your health care provider. Make sure you discuss any questions you have with your health care provider. Document Released: 02/20/2009 Document Revised: 10/02/2015 Document Reviewed: 05/31/2014 Elsevier Interactive Patient Education  2017 [employer].

## 2020-04-07 NOTE — Patient Instructions (Signed)
Td (Tetanus, Diphtheria) Vaccine: What You Need to Know 1. Why get vaccinated? Td vaccine can prevent tetanus and diphtheria. Tetanus enters the body through cuts or wounds. Diphtheria spreads from person to person.  TETANUS (T) causes painful stiffening of the muscles. Tetanus can lead to serious health problems, including being unable to open the mouth, having trouble swallowing and breathing, or death.  DIPHTHERIA (D) can lead to difficulty breathing, heart failure, paralysis, or death. 2. Td vaccine Td is only for children 7 years and older, adolescents, and adults.  Td is usually given as a booster dose every 10 years, but it can also be given earlier after a severe and dirty wound or burn. Another vaccine, called Tdap, that protects against pertussis, also known as "whooping cough," in addition to tetanus and diphtheria, may be used instead of Td.  Td may be given at the same time as other vaccines. 3. Talk with your health care provider Tell your vaccine provider if the person getting the vaccine:  Has had an allergic reaction after a previous dose of any vaccine that protects against tetanus or diphtheria, or has any severe, life-threatening allergies.  Has ever had Guillain-Barr Syndrome (also called GBS).  Has had severe pain or swelling after a previous dose of any vaccine that protects against tetanus or diphtheria. In some cases, your health care provider may decide to postpone Td vaccination to a future visit.  People with minor illnesses, such as a cold, may be vaccinated. People who are moderately or severely ill should usually wait until they recover before getting Td vaccine.  Your health care provider can give you more information. 4. Risks of a vaccine reaction  Pain, redness, or swelling where the shot was given, mild fever, headache, feeling tired, and nausea, vomiting, diarrhea, or stomachache sometimes happen after Td vaccine. People sometimes faint after medical  procedures, including vaccination. Tell your provider if you feel dizzy or have vision changes or ringing in the ears.  As with any medicine, there is a very remote chance of a vaccine causing a severe allergic reaction, other serious injury, or death. 5. What if there is a serious problem? An allergic reaction could occur after the vaccinated person leaves the clinic. If you see signs of a severe allergic reaction (hives, swelling of the face and throat, difficulty breathing, a fast heartbeat, dizziness, or weakness), call 9-1-1 and get the person to the nearest hospital.  For other signs that concern you, call your health care provider.  Adverse reactions should be reported to the Vaccine Adverse Event Reporting System (VAERS). Your health care provider will usually file this report, or you can do it yourself. Visit the VAERS website at www.vaers.hhs.gov or call 1-800-822-7967. VAERS is only for reporting reactions, and VAERS staff do not give medical advice. 6. The National Vaccine Injury Compensation Program The National Vaccine Injury Compensation Program (VICP) is a federal program that was created to compensate people who may have been injured by certain vaccines. Visit the VICP website at www.hrsa.gov/vaccinecompensation or call 1-800-338-2382 to learn about the program and about filing a claim. There is a time limit to file a claim for compensation. 7. How can I learn more?  Ask your health care provider.  Call your local or state health department.  Contact the Centers for Disease Control and Prevention (CDC): ? Call 1-800-232-4636 (1-800-CDC-INFO) or ? Visit CDC's website at www.cdc.gov/vaccines Vaccine Information Statement Td Vaccine (08/09/18) This information is not intended to replace advice given   to you by your health care provider. Make sure you discuss any questions you have with your health care provider. Document Revised: 09/18/2018 Document Reviewed: 08/21/2018 Elsevier  Patient Education  2020 [employer]  

## 2020-04-07 NOTE — Assessment & Plan Note (Signed)
Rechecking labs today. Await results. Treat as needed.  °

## 2020-04-07 NOTE — Progress Notes (Signed)
I connected with Julia Deleon today by telephone and verified that I am speaking with the correct person using two identifiers. Location patient: home Location provider: work Persons participating in the virtual visit: Elvie Horger, Finnis Colee LPN.   I discussed the limitations, risks, security and privacy concerns of performing an evaluation and management service by telephone and the availability of in person appointments. I also discussed with the patient that there may be a patient responsible charge related to this service. The patient expressed understanding and verbally consented to this telephonic visit.    Interactive audio and video telecommunications were attempted between this provider and patient, however failed, due to patient having technical difficulties OR patient did not have access to video capability.  We continued and completed visit with audio only.     Vital signs may be patient reported or missing.  Subjective:   Julia Deleon is a 74 y.o. female who presents for Medicare Annual (Subsequent) preventive examination.  Review of Systems     Cardiac Risk Factors include: advanced age (>82men, >4 women);dyslipidemia;hypertension     Objective:    Today's Vitals   04/07/20 0941 04/07/20 0942  Weight: 158 lb (71.7 kg)   Height: 5\' 4"  (1.626 m)   PainSc:  3    Body mass index is 27.12 kg/m.  Advanced Directives 04/07/2020 01/22/2020 11/14/2018 09/30/2018 09/27/2018 09/22/2018 03/30/2018  Does Patient Have a Medical Advance Directive? No No No No No No No  Does patient want to make changes to medical advance directive? - - - - - - -  Would patient like information on creating a medical advance directive? - - No - Patient declined No - Patient declined No - Patient declined No - Patient declined Yes (MAU/Ambulatory/Procedural Areas - Information given)    Current Medications (verified) Outpatient Encounter Medications as of 04/07/2020  Medication Sig  . acetaminophen  (TYLENOL) 650 MG CR tablet Take 650-1,300 mg by mouth every 8 (eight) hours as needed for pain.   Marland Kitchen azelastine (ASTELIN) 0.1 % nasal spray Place 1 spray into both nostrils daily. Use in each nostril as directed  . chlorzoxazone (PARAFON) 500 MG tablet Take 1 tablet (500 mg total) by mouth 4 (four) times daily as needed for muscle spasms.  . Cholecalciferol (VITAMIN D3) 2000 units TABS Take 2,000 Units by mouth daily.   . Coenzyme Q10 (COQ-10) 100 MG CAPS Take 100 mg by mouth daily.  Marland Kitchen CRANBERRY PO Take 1 capsule by mouth daily.  . Cyanocobalamin (B-12-SL) 1000 MCG SUBL Place 500 mcg under the tongue 2 (two) times a day.  Marland Kitchen DHEA 10 MG CAPS Take 15 mg by mouth daily.   Marland Kitchen EPINEPHrine 0.3 mg/0.3 mL IJ SOAJ injection   . Estriol POWD INSERT 0.5GM 2-3 TIMES WEEKLY AS NEEDED  . fexofenadine (ALLEGRA) 60 MG tablet Take 60 mg by mouth daily.  Marland Kitchen glucosamine-chondroitin 500-400 MG tablet Take 1 tablet by mouth 3 (three) times daily.  Marland Kitchen levothyroxine (SYNTHROID) 75 MCG tablet TAKE ONE TABLET EACH MORNING BEFORE BREAKFAST  . losartan (COZAAR) 100 MG tablet Take 1 tablet (100 mg total) by mouth daily.  . Multiple Vitamin (MULTIVITAMIN WITH MINERALS) TABS tablet Take 1 tablet by mouth every evening.  . Multiple Vitamins-Minerals (LUTEIN-ZEAXANTHIN PO) Take 1 tablet by mouth at bedtime.  . Probiotic CAPS Take 1 capsule by mouth at bedtime.  . Progesterone Micronized (PROGESTERONE, BULK,) POWD Apply 1 application topically See admin instructions. 4% compound- topically  . triamcinolone (NASACORT ALLERGY 24HR)  55 MCG/ACT AERO nasal inhaler Place 2 sprays daily into the nose.  . TURMERIC PO Take 1 tablet by mouth 2 (two) times a week.  . Alpha-Lipoic Acid 100 MG TABS Take 100 mg by mouth daily with lunch. (Patient not taking: Reported on 04/07/2020)  . clotrimazole-betamethasone (LOTRISONE) cream Apply externally BID prn sx up to 2 wks (Patient not taking: Reported on 04/07/2020)  . D-MANNOSE PO Take by mouth.  (Patient not taking: Reported on 04/07/2020)   No facility-administered encounter medications on file as of 04/07/2020.    Allergies (verified) Colesevelam, Levofloxacin, Benazepril, Methylisothiazolinone, Thimerosal, Amoxicillin-pot clavulanate, Latex, and Simvastatin   History: Past Medical History:  Diagnosis Date  . Arthritis   . COPD (chronic obstructive pulmonary disease) ([place])   . Family history of adverse reaction to anesthesia    father was slow to wake up  . Fuch's endothelial dystrophy   . Hyperlipidemia   . Hypothyroidism   . PONV (postoperative nausea and vomiting)    slow to wake up and PONV   Past Surgical History:  Procedure Laterality Date  . APPENDECTOMY    . BACK SURGERY  09/26/2018   L4-5 PLIF by Dr. Arnoldo Morale  . CARDIAC CATHETERIZATION    . CHOLECYSTECTOMY    . EYE SURGERY    . FOOT SURGERY Right    pin removed left  . laser vein surgery    . NASAL SINUS SURGERY    . RIGHT/LEFT HEART CATH AND CORONARY ANGIOGRAPHY N/A 12/06/2017   Procedure: RIGHT/LEFT HEART CATH AND CORONARY ANGIOGRAPHY;  Surgeon: Yolonda Kida, MD;  Location: [facility];  Service: Cardiovascular;  Laterality: N/A;  . TONSILLECTOMY     Family History  Problem Relation Age of Onset  . Cancer Mother        lung  . CAD Mother   . Stroke Father   . Breast cancer Sister        33s  . Varicose Veins Son   . Heart disease Maternal Grandmother   . Cancer Paternal Grandmother        throat  . Emphysema Paternal Grandfather   . Fibromyalgia Sister   . Brain cancer Sister   . Breast cancer Other    Social History   Socioeconomic History  . Marital status: Married    Spouse name: Not on file  . Number of children: Not on file  . Years of education: 4  . Highest education level: Bachelor's degree (e.g., BA, AB, BS)  Occupational History  . Occupation: retired  Tobacco Use  . Smoking status: Never Smoker  . Smokeless tobacco: Never Used  Vaping Use  . Vaping Use:  Never used  Substance and Sexual Activity  . Alcohol use: No    Alcohol/week: 0.0 standard drinks  . Drug use: No  . Sexual activity: Not Currently    Birth control/protection: Post-menopausal  Other Topics Concern  . Not on file  Social History Narrative  . Not on file   Social Determinants of Health   Financial Resource Strain: Low Risk   . Difficulty of Paying Living Expenses: Not hard at all  Food Insecurity: No Food Insecurity  . Worried About Charity fundraiser in the Last Year: Never true  . Ran Out of Food in the Last Year: Never true  Transportation Needs: No Transportation Needs  . Lack of Transportation (Medical): No  . Lack of Transportation (Non-Medical): No  Physical Activity: Insufficiently Active  . Days of Exercise per  Week: 3 days  . Minutes of Exercise per Session: 40 min  Stress: No Stress Concern Present  . Feeling of Stress : Not at all  Social Connections:   . Frequency of Communication with Friends and Family: Not on file  . Frequency of Social Gatherings with Friends and Family: Not on file  . Attends Religious Services: Not on file  . Active Member of Clubs or Organizations: Not on file  . Attends Archivist Meetings: Not on file  . Marital Status: Not on file    Tobacco Counseling Counseling given: Not Answered   Clinical Intake:  Pre-visit preparation completed: Yes  Pain : 0-10 Pain Score: 3  Pain Type: Chronic pain Pain Location: Generalized Pain Descriptors / Indicators: Aching Pain Onset: More than a month ago Pain Frequency: Constant     Nutritional Status: BMI 25 -29 Overweight Nutritional Risks: None Diabetes: No  How often do you need to have someone help you when you read instructions, pamphlets, or other written materials from your doctor or pharmacy?: 1 - Never What is the last grade level you completed in school?: college  Diabetic? no  Interpreter Needed?: No  Information entered by :: NAllen  LPN   Activities of Daily Living In your present state of health, do you have any difficulty performing the following activities: 04/07/2020  Hearing? N  Vision? N  Difficulty concentrating or making decisions? N  Walking or climbing stairs? N  Dressing or bathing? N  Doing errands, shopping? N  Preparing Food and eating ? N  Using the Toilet? N  In the past six months, have you accidently leaked urine? Y  Comment proplapsed  Do you have problems with loss of bowel control? N  Managing your Medications? N  Managing your Finances? N  Housekeeping or managing your Housekeeping? N  Some recent data might be hidden    Patient Care Team: [name], DO as PCP - General (Family Medicine)  Indicate any recent Medical Services you may have received from other than [facility] providers in the past year (date may be approximate).     Assessment:   This is a routine wellness examination for Julia Deleon.  Hearing/Vision screen  Hearing Screening   125Hz  250Hz  500Hz  1000Hz  2000Hz  3000Hz  4000Hz  6000Hz  8000Hz   Right ear:           Left ear:           Vision Screening Comments: Regular eye exams, Dr. Wallace Going  Dietary issues and exercise activities discussed: Current Exercise Habits: Structured exercise class, Type of exercise: strength training/weights;treadmill, Time (Minutes): 40, Frequency (Times/Week): 3, Weekly Exercise (Minutes/Week): 120  Goals    . DIET - INCREASE WATER INTAKE     Recommend drinking at least 5-6 glasses of water a day     . Patient Stated     04/07/2020, stay healthy      Depression Screen PHQ 2/9 Scores 04/07/2020 04/03/2019 03/30/2018 06/06/2017 03/28/2017 03/28/2017 03/28/2017  PHQ - 2 Score 0 0 0 1 1 1 1   PHQ- 9 Score - 3 - - 3 2 -    Fall Risk Fall Risk  04/07/2020 04/03/2019 03/30/2018 11/23/2017 10/17/2017  Falls in the past year? 0 0 1 Yes Yes  Number falls in past yr: - 0 1 1 1   Injury with Fall? - 0 0 No No  Risk for fall due to : Medication  side effect - - - -  Follow up Falls evaluation completed;Education provided;Falls prevention  discussed - - - Falls evaluation completed    Any stairs in or around the home? Yes  If so, are there any without handrails? No  Home free of loose throw rugs in walkways, pet beds, electrical cords, etc? Yes  Adequate lighting in your home to reduce risk of falls? Yes   ASSISTIVE DEVICES UTILIZED TO PREVENT FALLS:  Life alert? No  Use of a cane, walker or w/c? No  Grab bars in the bathroom? No  Shower chair or bench in shower? No  Elevated toilet seat or a handicapped toilet? No   TIMED UP AND GO:  Was the test performed? No .     Cognitive Function:     6CIT Screen 04/07/2020 04/03/2019 03/30/2018 03/28/2017  What Year? 0 points 0 points 0 points 0 points  What month? 0 points 0 points 0 points 0 points  What time? 0 points 0 points 0 points 0 points  Count back from 20 0 points 0 points 0 points 0 points  Months in reverse 0 points 0 points 0 points 0 points  Repeat phrase 0 points 0 points 0 points 0 points  Total Score 0 0 0 0    Immunizations Immunization History  Administered Date(s) Administered  . Fluad Quad(high Dose 65+) 02/07/2020  . Influenza, High Dose Seasonal PF 03/25/2016  . Influenza,inj,Quad PF,6+ Mos 03/25/2015, 06/02/2018  . Influenza-Unspecified 02/22/2019  . Moderna SARS-COVID-2 Vaccination 06/03/2019, 07/02/2019  . Pneumococcal Conjugate-13 02/13/2014  . Pneumococcal Polysaccharide-23 12/09/2016  . Tdap 07/21/2010  . Zoster 05/07/2009    TDAP status: Up to date Flu Vaccine status: Up to date Pneumococcal vaccine status: Up to date Covid-19 vaccine status: Completed vaccines  Qualifies for Shingles Vaccine? Yes   Zostavax completed Yes   Shingrix Completed?: No.    Education has been provided regarding the importance of this vaccine. Patient has been advised to call insurance company to determine out of pocket expense if they have not yet  received this vaccine. Advised may also receive vaccine at local pharmacy or Health Dept. Verbalized acceptance and understanding.  Screening Tests Health Maintenance  Topic Date Due  . TETANUS/TDAP  07/20/2020  . COLONOSCOPY  11/09/2021  . MAMMOGRAM  12/05/2021  . INFLUENZA VACCINE  Completed  . DEXA SCAN  Completed  . COVID-19 Vaccine  Completed  . Hepatitis C Screening  Completed  . PNA vac Low Risk Adult  Completed    Health Maintenance  There are no preventive care reminders to display for this patient.  Colorectal cancer screening: Completed 11/10/2011. Repeat every 10 years Mammogram status: Completed 12/06/2019. Repeat every year Bone Density status: Completed 03/25/2014.   Lung Cancer Screening: (Low Dose CT Chest recommended if Age 16-80 years, 30 pack-year currently smoking OR have quit w/in 15years.) does not qualify.   Lung Cancer Screening Referral: no  Additional Screening:  Hepatitis C Screening: does qualify; Completed 03/25/2016  Vision Screening: Recommended annual ophthalmology exams for early detection of glaucoma and other disorders of the eye. Is the patient up to date with their annual eye exam?  Yes  Who is the provider or what is the name of the office in which the patient attends annual eye exams? Dr. Wallace Going If pt is not established with a provider, would they like to be referred to a provider to establish care? No .   Dental Screening: Recommended annual dental exams for proper oral hygiene  Community Resource Referral / Chronic Care Management: CRR required this visit?  No  CCM required this visit?  No      Plan:     I have personally reviewed and noted the following in the patient's chart:   . Medical and social history . Use of alcohol, tobacco or illicit drugs  . Current medications and supplements . Functional ability and status . Nutritional status . Physical activity . Advanced directives . List of other  physicians . Hospitalizations, surgeries, and ER visits in previous 12 months . Vitals . Screenings to include cognitive, depression, and falls . Referrals and appointments  In addition, I have reviewed and discussed with patient certain preventive protocols, quality metrics, and best practice recommendations. A written personalized care plan for preventive services as well as general preventive health recommendations were provided to patient.     Kellie Simmering, LPN   [id number]   Nurse Notes:

## 2020-04-07 NOTE — Progress Notes (Signed)
BP 133/66   Pulse (!) 55   Temp 97.6 F (36.4 C) (Oral)   Ht 5' 3.66" (1.617 m)   Wt 158 lb 6.4 oz (71.8 kg)   LMP  (LMP Unknown)   SpO2 100%   BMI 27.48 kg/m    Subjective:    Patient ID: Julia Deleon, female    DOB: 1946/02/20, 74 y.o.   MRN: [id number]  HPI: Julia Deleon is a 74 y.o. female  Chief Complaint  Patient presents with  . Hypertension  . Hyperlipidemia  . Hypothyroidism   HYPERTENSION / HYPERLIPIDEMIA Satisfied with current treatment? yes Duration of hypertension: chronic BP monitoring frequency: not checking BP medication side effects: no Past BP meds: losartan Duration of hyperlipidemia: chronic Cholesterol medication side effects: not on anything Cholesterol supplements: fish oil Past cholesterol medications: none Medication compliance: excellent compliance Aspirin: no Recent stressors: no Recurrent headaches: no Visual changes: no Palpitations: no Dyspnea: no Chest pain: no Lower extremity edema: no Dizzy/lightheaded: no  HYPOTHYROIDISM Thyroid control status:controlled Satisfied with current treatment? yes Medication side effects: no Medication compliance: excellent compliance Recent dose adjustment:no Fatigue: no Cold intolerance: no Heat intolerance: no Weight gain: no Weight loss: no Constipation: no Diarrhea/loose stools: no Palpitations: no Lower extremity edema: no Anxiety/depressed mood: no  Relevant past medical, surgical, family and social history reviewed and updated as indicated. Interim medical history since our last visit reviewed. Allergies and medications reviewed and updated.  Review of Systems  Constitutional: Negative.   Respiratory: Negative.   Cardiovascular: Negative.   Gastrointestinal: Negative.   Musculoskeletal: Negative.   Neurological: Negative.   Psychiatric/Behavioral: Negative.     Per HPI unless specifically indicated above     Objective:    BP 133/66   Pulse (!) 55   Temp 97.6 F (36.4  C) (Oral)   Ht 5' 3.66" (1.617 m)   Wt 158 lb 6.4 oz (71.8 kg)   LMP  (LMP Unknown)   SpO2 100%   BMI 27.48 kg/m   Wt Readings from Last 3 Encounters:  04/07/20 158 lb 6.4 oz (71.8 kg)  04/07/20 158 lb (71.7 kg)  01/22/20 158 lb (71.7 kg)    Physical Exam Vitals and nursing note reviewed.  Constitutional:      General: She is not in acute distress.    Appearance: Normal appearance. She is not ill-appearing, toxic-appearing or diaphoretic.  HENT:     Head: Normocephalic and atraumatic.     Right Ear: External ear normal.     Left Ear: External ear normal.     Nose: Nose normal.     Mouth/Throat:     Mouth: Mucous membranes are moist.     Pharynx: Oropharynx is clear.  Eyes:     General: No scleral icterus.       Right eye: No discharge.        Left eye: No discharge.     Extraocular Movements: Extraocular movements intact.     Conjunctiva/sclera: Conjunctivae normal.     Pupils: Pupils are equal, round, and reactive to light.  Cardiovascular:     Rate and Rhythm: Normal rate and regular rhythm.     Pulses: Normal pulses.     Heart sounds: Normal heart sounds. No murmur heard.  No friction rub. No gallop.   Pulmonary:     Effort: Pulmonary effort is normal. No respiratory distress.     Breath sounds: Normal breath sounds. No stridor. No wheezing, rhonchi or rales.  Chest:  Chest wall: No tenderness.  Musculoskeletal:        General: Normal range of motion.     Cervical back: Normal range of motion and neck supple.  Skin:    General: Skin is warm and dry.     Capillary Refill: Capillary refill takes less than 2 seconds.     Coloration: Skin is not jaundiced or pale.     Findings: No bruising, erythema, lesion or rash.     Comments: Small wound on L leg, healing well  Neurological:     General: No focal deficit present.     Mental Status: She is alert and oriented to person, place, and time. Mental status is at baseline.  Psychiatric:        Mood and Affect:  Mood normal.        Behavior: Behavior normal.        Thought Content: Thought content normal.        Judgment: Judgment normal.     Results for orders placed or performed in visit on 12/24/19  POCT Wet Prep with KOH  Result Value Ref Range   Trichomonas, UA Negative    Clue Cells Wet Prep HPF POC neg    Epithelial Wet Prep HPF POC     Yeast Wet Prep HPF POC neg    Bacteria Wet Prep HPF POC     RBC Wet Prep HPF POC     WBC Wet Prep HPF POC     KOH Prep POC Negative Negative      Assessment & Plan:   Problem List Items Addressed This Visit      Endocrine   Hypothyroidism - Primary    Rechecking labs today. Await results. Treat as needed.       Relevant Orders   CBC with Differential/Platelet   Comprehensive metabolic panel   TSH     Genitourinary   Benign hypertensive renal disease    Under good control on current regimen. Continue current regimen. Continue to monitor. Call with any concerns. Refills given. Labs to be drawn ASAP.       Relevant Medications   losartan (COZAAR) 100 MG tablet   Other Relevant Orders   CBC with Differential/Platelet   Comprehensive metabolic panel   Microalbumin, Urine Waived   Urinalysis, Routine w reflex microscopic   Chronic kidney disease, stage III (moderate) ([place])    Rechecking labs today. Await results. Treat as needed.       Relevant Orders   CBC with Differential/Platelet   Comprehensive metabolic panel   Microalbumin, Urine Waived     Other   Hyperlipidemia    Will recheck labs when fasting. Orders in. Call with any concerns. Continue to monitor.       Relevant Medications   losartan (COZAAR) 100 MG tablet   Other Relevant Orders   CBC with Differential/Platelet   Comprehensive metabolic panel   Lipid Panel w/o Chol/HDL Ratio    Other Visit Diagnoses    Open wound of left lower leg, initial encounter       Healing well. Due for Td. Given today.   Essential hypertension       Relevant Medications   losartan  (COZAAR) 100 MG tablet       Follow up plan: Return in about 6 months (around 10/05/2020).

## 2020-04-07 NOTE — Assessment & Plan Note (Signed)
Will recheck labs when fasting. Orders in. Call with any concerns. Continue to monitor.

## 2020-04-07 NOTE — Assessment & Plan Note (Signed)
Under good control on current regimen. Continue current regimen. Continue to monitor. Call with any concerns. Refills given. Labs to be drawn ASAP.   

## 2020-04-08 ENCOUNTER — Encounter: Payer: Medicare Other | Admitting: Physical Therapy

## 2020-04-08 DIAGNOSIS — M5432 Sciatica, left side: Secondary | ICD-10-CM | POA: Diagnosis not present

## 2020-04-08 DIAGNOSIS — M47819 Spondylosis without myelopathy or radiculopathy, site unspecified: Secondary | ICD-10-CM | POA: Insufficient documentation

## 2020-04-08 DIAGNOSIS — M1711 Unilateral primary osteoarthritis, right knee: Secondary | ICD-10-CM | POA: Diagnosis not present

## 2020-04-08 DIAGNOSIS — M25561 Pain in right knee: Secondary | ICD-10-CM | POA: Diagnosis not present

## 2020-04-08 DIAGNOSIS — G8929 Other chronic pain: Secondary | ICD-10-CM | POA: Diagnosis not present

## 2020-04-08 DIAGNOSIS — M25461 Effusion, right knee: Secondary | ICD-10-CM | POA: Diagnosis not present

## 2020-04-09 ENCOUNTER — Telehealth: Payer: Self-pay

## 2020-04-09 NOTE — Telephone Encounter (Signed)
At the request of Dr Matilde Sprang, he would like patient scheduled in the next available spot for office visit with a pelvic exam. [facility] for patient to please call office to be scheduled.

## 2020-04-11 ENCOUNTER — Telehealth: Payer: Self-pay | Admitting: Urology

## 2020-04-11 ENCOUNTER — Other Ambulatory Visit: Payer: Medicare Other

## 2020-04-11 ENCOUNTER — Encounter: Payer: Medicare Other | Admitting: Physical Therapy

## 2020-04-11 ENCOUNTER — Other Ambulatory Visit: Payer: Self-pay

## 2020-04-11 DIAGNOSIS — N183 Chronic kidney disease, stage 3 unspecified: Secondary | ICD-10-CM

## 2020-04-11 DIAGNOSIS — E785 Hyperlipidemia, unspecified: Secondary | ICD-10-CM

## 2020-04-11 DIAGNOSIS — E038 Other specified hypothyroidism: Secondary | ICD-10-CM | POA: Diagnosis not present

## 2020-04-11 DIAGNOSIS — R82998 Other abnormal findings in urine: Secondary | ICD-10-CM | POA: Diagnosis not present

## 2020-04-11 DIAGNOSIS — I129 Hypertensive chronic kidney disease with stage 1 through stage 4 chronic kidney disease, or unspecified chronic kidney disease: Secondary | ICD-10-CM | POA: Diagnosis not present

## 2020-04-11 LAB — MICROSCOPIC EXAMINATION: RBC, Urine: NONE SEEN /hpf (ref 0–2)

## 2020-04-11 LAB — URINALYSIS, ROUTINE W REFLEX MICROSCOPIC
Bilirubin, UA: NEGATIVE
Glucose, UA: NEGATIVE
Ketones, UA: NEGATIVE
Nitrite, UA: NEGATIVE
Protein,UA: NEGATIVE
RBC, UA: NEGATIVE
Specific Gravity, UA: 1.015 (ref 1.005–1.030)
Urobilinogen, Ur: 0.2 mg/dL (ref 0.2–1.0)
pH, UA: 5 (ref 5.0–7.5)

## 2020-04-11 LAB — MICROALBUMIN, URINE WAIVED
Creatinine, Urine Waived: 100 mg/dL (ref 10–300)
Microalb, Ur Waived: 30 mg/L — ABNORMAL HIGH (ref 0–19)
Microalb/Creat Ratio: <30 mg/g (ref <30)

## 2020-04-11 NOTE — Telephone Encounter (Signed)
Patient called back and has been scheduled for her follow up app

## 2020-04-12 LAB — CBC WITH DIFFERENTIAL/PLATELET
Basophils Absolute: 0 10*3/uL (ref 0.0–0.2)
Basos: 1 %
EOS (ABSOLUTE): 0.2 10*3/uL (ref 0.0–0.4)
Eos: 3 %
Hematocrit: 40.4 % (ref 34.0–46.6)
Hemoglobin: 13.5 g/dL (ref 11.1–15.9)
Immature Grans (Abs): 0.1 10*3/uL (ref 0.0–0.1)
Immature Granulocytes: 1 %
Lymphocytes Absolute: 2.1 10*3/uL (ref 0.7–3.1)
Lymphs: 30 %
MCH: 31.5 pg (ref 26.6–33.0)
MCHC: 33.4 g/dL (ref 31.5–35.7)
MCV: 94 fL (ref 79–97)
Monocytes Absolute: 0.5 10*3/uL (ref 0.1–0.9)
Monocytes: 8 %
Neutrophils Absolute: 4 10*3/uL (ref 1.4–7.0)
Neutrophils: 57 %
Platelets: 198 10*3/uL (ref 150–450)
RBC: 4.29 x10E6/uL (ref 3.77–5.28)
RDW: 12.7 % (ref 11.7–15.4)
WBC: 6.8 10*3/uL (ref 3.4–10.8)

## 2020-04-12 LAB — COMPREHENSIVE METABOLIC PANEL
ALT: 18 IU/L (ref 0–32)
AST: 17 IU/L (ref 0–40)
Albumin/Globulin Ratio: 1.9 (ref 1.2–2.2)
Albumin: 4.4 g/dL (ref 3.7–4.7)
Alkaline Phosphatase: 55 IU/L (ref 44–121)
BUN/Creatinine Ratio: 22 (ref 12–28)
BUN: 25 mg/dL (ref 8–27)
Bilirubin Total: 0.7 mg/dL (ref 0.0–1.2)
CO2: 23 mmol/L (ref 20–29)
Calcium: 9.2 mg/dL (ref 8.7–10.3)
Chloride: 103 mmol/L (ref 96–106)
Creatinine, Ser: 1.14 mg/dL — ABNORMAL HIGH (ref 0.57–1.00)
GFR calc Af Amer: 56 mL/min/{1.73_m2} — ABNORMAL LOW (ref >59)
GFR calc non Af Amer: 48 mL/min/{1.73_m2} — ABNORMAL LOW (ref >59)
Globulin, Total: 2.3 g/dL (ref 1.5–4.5)
Glucose: 85 mg/dL (ref 65–99)
Potassium: 4.8 mmol/L (ref 3.5–5.2)
Sodium: 139 mmol/L (ref 134–144)
Total Protein: 6.7 g/dL (ref 6.0–8.5)

## 2020-04-12 LAB — LIPID PANEL W/O CHOL/HDL RATIO
Cholesterol, Total: 217 mg/dL — ABNORMAL HIGH (ref 100–199)
HDL: 38 mg/dL — ABNORMAL LOW (ref >39)
LDL Chol Calc (NIH): 157 mg/dL — ABNORMAL HIGH (ref 0–99)
Triglycerides: 119 mg/dL (ref 0–149)
VLDL Cholesterol Cal: 22 mg/dL (ref 5–40)

## 2020-04-12 LAB — TSH: TSH: 3.07 u[IU]/mL (ref 0.450–4.500)

## 2020-04-14 ENCOUNTER — Ambulatory Visit (INDEPENDENT_AMBULATORY_CARE_PROVIDER_SITE_OTHER): Payer: Medicare Other | Admitting: Urology

## 2020-04-14 ENCOUNTER — Other Ambulatory Visit: Payer: Self-pay

## 2020-04-14 VITALS — BP 149/74 | HR 80 | Wt 158.0 lb

## 2020-04-14 DIAGNOSIS — N8111 Cystocele, midline: Secondary | ICD-10-CM

## 2020-04-14 LAB — URINE CULTURE: Organism ID, Bacteria: NO GROWTH

## 2020-04-14 NOTE — Addendum Note (Signed)
Addended by: Alvera Novel on: 04/14/2020 01:41 PM   Modules accepted: Orders

## 2020-04-14 NOTE — Progress Notes (Signed)
04/14/2020 1:11 PM   Julia Deleon April 12, 1946 [id number]  Referring provider: [name], DO [place],  [place] [address]  No chief complaint on file.   HPI: Dr Erlene Quan: Recurrent urinary tract infections with positive cultures.  She had urinary retention following laminectomy Main 2020.  Catheter volumes 50 cc 275 cc.  She finally stopped catheterizing.  Use estrogen and then other cream for vaginal atrophy by gynecology.  Had urodynamics at [facility] in September 2020.  Failed methenamine.  I was consulted to assess the patient's prolapse symptoms for the last week.  She feels a bulge in the vagina.  She does not reduce it.  She has not had a hysterectomy.  Since the prolapse she can have urge incontinence but does not wear a pad.  Several weeks ago she may have had a little bit her urgency leaking a few drops.  No stress incontinence.  No bedwetting  She voids 3 times an hour in the morning and then every 1 or 2 hours in the afternoon.  Normally no nocturia though this increased a little bit the last week.  Flow was good.   On pelvic examination patient had grade 3 cystocele that just reached the introitus.  She had a moderate central defect.  Cervix descended from 8 or 9 cm to approximately 5 cm.  I think she has some shortening of the anterior vaginal wall with some atrophy.    Patient has pelvic organ prolapse.  She has some urgency incontinence.  She has a risk factor for neurogenic bladder and had retention after back surgery.  If she ever had surgery she likely best benefit from a transvaginal hysterectomy with vault suspension and cystocele repair and graft.  Role of urodynamics and pessary discussed.  Role of watchful waiting discussed  Patient wants to think about it but she would like to see Dr. Garwin Brothers in [place] for possible pessary just in case she goes on to have surgery.  She feels uncomfortable with that and I urged her to go back on her estrogen cream hoping  that less vaginal dryness would improve the symptom.   Today Prolapse stable.  Frequency stable.  Patient also went into retention after one of her children for 3 days requiring a catheter.  She does not tolerate foreign bodies like a pessary because she think she will get that infection The repeat examination was similar to the above 1.  PMH: Past Medical History:  Diagnosis Date  . Arthritis   . COPD (chronic obstructive pulmonary disease) ([place])   . Family history of adverse reaction to anesthesia    father was slow to wake up  . Fuch's endothelial dystrophy   . Hyperlipidemia   . Hypothyroidism   . PONV (postoperative nausea and vomiting)    slow to wake up and PONV    Surgical History: Past Surgical History:  Procedure Laterality Date  . APPENDECTOMY    . BACK SURGERY  09/26/2018   L4-5 PLIF by Dr. Arnoldo Morale  . CARDIAC CATHETERIZATION    . CHOLECYSTECTOMY    . EYE SURGERY    . FOOT SURGERY Right    pin removed left  . laser vein surgery    . NASAL SINUS SURGERY    . RIGHT/LEFT HEART CATH AND CORONARY ANGIOGRAPHY N/A 12/06/2017   Procedure: RIGHT/LEFT HEART CATH AND CORONARY ANGIOGRAPHY;  Surgeon: Yolonda Kida, MD;  Location: [facility];  Service: Cardiovascular;  Laterality: N/A;  .  TONSILLECTOMY      Home Medications:  Allergies as of 04/14/2020      Reactions   Colesevelam Nausea And Vomiting   WELCHOL   Levofloxacin Hives   Benazepril Other (See Comments)   Methylisothiazolinone Other (See Comments)   Positive on allergy test   Thimerosal Other (See Comments)   Positive on allergy test   Amoxicillin-pot Clavulanate Nausea Only   Did it involve swelling of the face/tongue/throat, SOB, or low BP? No Did it involve sudden or severe rash/hives, skin peeling, or any reaction on the inside of your mouth or nose? No Did you need to seek medical attention at a hospital or doctor's office? Unknown When did it last happen?unknown If all above  answers are "NO", may proceed with cephalosporin use.   Latex Rash   Family history of latex allergy   Simvastatin Itching      Medication List       Accurate as of April 14, 2020  1:11 PM. If you have any questions, ask your nurse or doctor.        STOP taking these medications   Alpha-Lipoic Acid 100 MG Tabs Stopped by: Reece Packer, MD   D-MANNOSE PO Stopped by: Reece Packer, MD   fexofenadine-pseudoephedrine 60-120 MG 12 hr tablet Commonly known as: ALLEGRA-D Stopped by: Reece Packer, MD   TURMERIC PO Stopped by: Reece Packer, MD     TAKE these medications   acetaminophen 650 MG CR tablet Commonly known as: TYLENOL Take 650-1,300 mg by mouth every 8 (eight) hours as needed for pain.   azelastine 0.1 % nasal spray Commonly known as: ASTELIN Place 1 spray into both nostrils daily. Use in each nostril as directed   B-12-SL 1000 MCG Subl Generic drug: Cyanocobalamin Place 500 mcg under the tongue 2 (two) times a day.   chlorzoxazone 500 MG tablet Commonly known as: PARAFON Take 1 tablet (500 mg total) by mouth 4 (four) times daily as needed for muscle spasms.   clotrimazole-betamethasone cream Commonly known as: LOTRISONE Apply externally BID prn sx up to 2 wks   CoQ-10 100 MG Caps Take 100 mg by mouth daily.   CRANBERRY PO Take 1 capsule by mouth daily.   D-Mannose Powd Take by mouth.   DHEA 10 MG Caps Take 15 mg by mouth daily.   EPINEPHrine 0.3 mg/0.3 mL Soaj injection Commonly known as: EPI-PEN   estradiol 0.1 MG/GM vaginal cream Commonly known as: ESTRACE Place vaginally.   Estriol Powd INSERT 0.5GM 2-3 TIMES WEEKLY AS NEEDED   fexofenadine 60 MG tablet Commonly known as: ALLEGRA Take 60 mg by mouth daily.   glucosamine-chondroitin 500-400 MG tablet Take 1 tablet by mouth 3 (three) times daily.   levothyroxine 75 MCG tablet Commonly known as: SYNTHROID TAKE ONE TABLET EACH MORNING BEFORE BREAKFAST     losartan 100 MG tablet Commonly known as: COZAAR Take 1 tablet (100 mg total) by mouth daily.   Lutein 20 MG Caps Take 1 capsule by mouth daily.   LUTEIN-ZEAXANTHIN PO Take 1 tablet by mouth at bedtime.   multivitamin with minerals Tabs tablet Take 1 tablet by mouth every evening.   Probiotic Caps Take 1 capsule by mouth at bedtime.   progesterone (bulk) Powd Apply 1 application topically See admin instructions. 4% compound- topically   traMADol 50 MG tablet Commonly known as: ULTRAM Take by mouth.   triamcinolone 55 MCG/ACT Aero nasal inhaler Commonly known as: Nasacort Allergy 24HR Place 2  sprays daily into the nose.   Turmeric Powd Take by mouth.   Ubiquinol 100 MG Caps Take 1 capsule by mouth daily.   Vitamin D3 50 MCG (2000 UT) Tabs Take 2,000 Units by mouth daily.       Allergies:  Allergies  Allergen Reactions  . Colesevelam Nausea And Vomiting    WELCHOL   . Levofloxacin Hives  . Benazepril Other (See Comments)  . Methylisothiazolinone Other (See Comments)    Positive on allergy test  . Thimerosal Other (See Comments)    Positive on allergy test  . Amoxicillin-Pot Clavulanate Nausea Only    Did it involve swelling of the face/tongue/throat, SOB, or low BP? No Did it involve sudden or severe rash/hives, skin peeling, or any reaction on the inside of your mouth or nose? No Did you need to seek medical attention at a hospital or doctor's office? Unknown When did it last happen?unknown If all above answers are "NO", may proceed with cephalosporin use.   . Latex Rash    Family history of latex allergy  . Simvastatin Itching    Family History: Family History  Problem Relation Age of Onset  . Cancer Mother        lung  . CAD Mother   . Stroke Father   . Breast cancer Sister        18s  . Varicose Veins Son   . Heart disease Maternal Grandmother   . Cancer Paternal Grandmother        throat  . Emphysema Paternal Grandfather   .  Fibromyalgia Sister   . Brain cancer Sister   . Breast cancer Other     Social History:  reports that she has never smoked. She has never used smokeless tobacco. She reports that she does not drink alcohol and does not use drugs.  ROS:                                        Physical Exam: BP (!) 149/74   Pulse 80   Wt 71.7 kg   LMP  (LMP Unknown)   BMI 27.41 kg/m   Constitutional:  Alert and oriented, No acute distress.  Laboratory Data: Lab Results  Component Value Date   WBC 6.8 04/11/2020   HGB 13.5 04/11/2020   HCT 40.4 04/11/2020   MCV 94 04/11/2020   PLT 198 04/11/2020    Lab Results  Component Value Date   CREATININE 1.14 (H) 04/11/2020    No results found for: PSA  No results found for: TESTOSTERONE  No results found for: HGBA1C  Urinalysis    Component Value Date/Time   COLORURINE YELLOW 09/30/2018 0927   APPEARANCEUR Clear 04/11/2020 0921   LABSPEC 1.020 09/30/2018 0927   PHURINE 5.0 09/30/2018 0927   GLUCOSEU Negative 04/11/2020 0921   HGBUR NEGATIVE 09/30/2018 0927   BILIRUBINUR Negative 04/11/2020 0921   [place] 09/30/2018 0927   PROTEINUR Negative 04/11/2020 0921   PROTEINUR NEGATIVE 09/30/2018 0927   NITRITE Negative 04/11/2020 0921   NITRITE NEGATIVE 09/30/2018 0927   LEUKOCYTESUR 2+ (A) 04/11/2020 0921   LEUKOCYTESUR NEGATIVE 09/30/2018 [id number]    Pertinent Imaging:   Assessment & Plan: Picture was drawn.  Role of urodynamics discussed.  I believe the surgery could be done vaginally but I respect Dr. Garwin Brothers concerns that the sacrospinous ligament may not be an ideal fixation point based on  vaginal length.  Certainly robotic procedure would suffice.  We spoke about this today and that would need referral post urodynamics if she want to continue.  She agreed with plan.  Urodynamics ordered.  She understands that any bladder infections which I think are infrequent and her are unrelated.   There are no  diagnoses linked to this encounter.  No follow-ups on file.  Reece Packer, MD  [facility] [address] [place],  [address] [phone]

## 2020-04-15 ENCOUNTER — Encounter: Payer: Medicare Other | Admitting: Physical Therapy

## 2020-04-15 DIAGNOSIS — M25461 Effusion, right knee: Secondary | ICD-10-CM | POA: Diagnosis not present

## 2020-04-15 DIAGNOSIS — M1711 Unilateral primary osteoarthritis, right knee: Secondary | ICD-10-CM | POA: Diagnosis not present

## 2020-04-15 DIAGNOSIS — M25561 Pain in right knee: Secondary | ICD-10-CM | POA: Diagnosis not present

## 2020-04-15 DIAGNOSIS — G8929 Other chronic pain: Secondary | ICD-10-CM | POA: Diagnosis not present

## 2020-04-17 DIAGNOSIS — M47819 Spondylosis without myelopathy or radiculopathy, site unspecified: Secondary | ICD-10-CM | POA: Diagnosis not present

## 2020-04-17 DIAGNOSIS — M961 Postlaminectomy syndrome, not elsewhere classified: Secondary | ICD-10-CM | POA: Diagnosis not present

## 2020-04-18 ENCOUNTER — Encounter: Payer: Medicare Other | Admitting: Physical Therapy

## 2020-04-22 ENCOUNTER — Encounter: Payer: Medicare Other | Admitting: Physical Therapy

## 2020-04-25 ENCOUNTER — Encounter: Payer: Medicare Other | Admitting: Physical Therapy

## 2020-04-29 ENCOUNTER — Encounter: Payer: Medicare Other | Admitting: Physical Therapy

## 2020-05-01 DIAGNOSIS — Z23 Encounter for immunization: Secondary | ICD-10-CM | POA: Diagnosis not present

## 2020-05-06 ENCOUNTER — Encounter: Payer: Medicare Other | Admitting: Physical Therapy

## 2020-05-07 DIAGNOSIS — J301 Allergic rhinitis due to pollen: Secondary | ICD-10-CM | POA: Diagnosis not present

## 2020-05-12 ENCOUNTER — Encounter: Payer: Medicare Other | Admitting: Physical Therapy

## 2020-05-13 DIAGNOSIS — M25561 Pain in right knee: Secondary | ICD-10-CM | POA: Diagnosis not present

## 2020-05-13 DIAGNOSIS — M25462 Effusion, left knee: Secondary | ICD-10-CM | POA: Diagnosis not present

## 2020-05-13 DIAGNOSIS — G8929 Other chronic pain: Secondary | ICD-10-CM | POA: Diagnosis not present

## 2020-05-13 DIAGNOSIS — M25461 Effusion, right knee: Secondary | ICD-10-CM | POA: Diagnosis not present

## 2020-05-13 DIAGNOSIS — M1712 Unilateral primary osteoarthritis, left knee: Secondary | ICD-10-CM | POA: Diagnosis not present

## 2020-05-13 DIAGNOSIS — M1711 Unilateral primary osteoarthritis, right knee: Secondary | ICD-10-CM | POA: Diagnosis not present

## 2020-05-13 DIAGNOSIS — M25562 Pain in left knee: Secondary | ICD-10-CM | POA: Diagnosis not present

## 2020-05-14 DIAGNOSIS — J301 Allergic rhinitis due to pollen: Secondary | ICD-10-CM | POA: Diagnosis not present

## 2020-05-22 ENCOUNTER — Other Ambulatory Visit: Payer: Self-pay | Admitting: Urology

## 2020-05-26 ENCOUNTER — Ambulatory Visit: Payer: Medicare Other | Admitting: Urology

## 2020-06-02 ENCOUNTER — Ambulatory Visit (INDEPENDENT_AMBULATORY_CARE_PROVIDER_SITE_OTHER): Payer: Medicare Other | Admitting: Urology

## 2020-06-02 ENCOUNTER — Encounter: Payer: Self-pay | Admitting: Urology

## 2020-06-02 ENCOUNTER — Other Ambulatory Visit: Payer: Self-pay

## 2020-06-02 VITALS — BP 180/77 | HR 73

## 2020-06-02 DIAGNOSIS — N8111 Cystocele, midline: Secondary | ICD-10-CM | POA: Diagnosis not present

## 2020-06-02 NOTE — Progress Notes (Signed)
06/02/2020 1:40 PM   Julia Deleon 12/29/1945 [id number]  Referring provider: [name], DO [place],  [place] [address]  Chief Complaint  Patient presents with  . Results    HPI: Dr Brandon:Recurrent urinary tract infections with positive cultures. She had urinary retention following laminectomy Main 2020.Catheter volumes 50 cc 275 cc. She finally stopped catheterizing. Use estrogen and then other cream for vaginal atrophy by gynecology. Had urodynamics at [facility] in September 2020.Failed methenamine.  I was consulted to assess the patient's prolapse symptoms for the last week. She feels a bulge in the vagina. She does not reduce it. She has not had a hysterectomy.  Since the prolapse she can have urge incontinence but does not wear a pad. Several weeks ago she may have had a little bit her urgency leaking a few drops. No stress incontinence. No bedwetting  She voids 3 times an hour in the morning and then every 1 or 2 hours in the afternoon. Normally no nocturia though this increased a little bit the last week. Flow was good.   On pelvic examination patient had grade 3 cystocele that just reached the introitus. She had a moderate central defect. Cervix descended from 8 or 9 cm to approximately 5 cm. I think she has some shortening of the anterior vaginal wall with some atrophy.   Patient has pelvic organ prolapse. She has some urgency incontinence. She has a risk factor for neurogenic bladder and had retention after back surgery. If she ever had surgery she likely best benefit from a transvaginal hysterectomy with vault suspension and cystocele repair and graft.   Patient wants to think about it but she would like to see Dr.[place]ousins[place] possible pessaryjust in case she goes on to have surgery. She feels uncomfortable with that and I urged her to go back on her estrogen cream hoping that less vaginal dryness would improve the symptom.    Patient also went into retention after one of her children for 3 days requiring a catheter.  She does not tolerate foreign bodies like a pessary because she think she will get that infection The repeat examination was similar to the above 1.   Picture was drawn.  Role of urodynamics discussed.  I believe the surgery could be done vaginally but I respect Dr. Garwin Brothers concerns that the sacrospinous ligament may not be an ideal fixation point based on vaginal length.  Certainly robotic procedure would suffice.  We spoke about this today and that would need referral post urodynamics if she want to continue.  Today Incontinence stable.  Prolapse stable. During urodynamics the patient did not void and was catheterized for 425 mL.  Maximum bladder capacity was 962 mL.  Bladder was stable.  Bladder was hyposensitive.  With the prolapse reduced her cough leak point pressure at 800 mL was 97 cm water with mild leakage.  At 900 mL it was 81 cm of water with mild to moderate leakage.  She could not leak when the prolapse was not reduced.  During voiding she voided with a vaginal pack in place.  She used mild abdominal straining.  She voided 560 mL with a max flow 22 mils per second.  Maximum voiding pressure was 18 cm of water.  Residual was 400 mL.  EMG activity increased during the voiding phase.  Bladder neck descended 2 or 3 cm.  The contraction during voiding was reasonably well sustained the details of the urodynamics are signed and dictated  spinal hardware noted.  Large cystocele noted.    PMH: Past Medical History:  Diagnosis Date  . Arthritis   . COPD (chronic obstructive pulmonary disease) ([place])   . Family history of adverse reaction to anesthesia    father was slow to wake up  . Fuch's endothelial dystrophy   . Hyperlipidemia   . Hypothyroidism   . PONV (postoperative nausea and vomiting)    slow to wake up and PONV    Surgical History: Past Surgical History:  Procedure Laterality Date   . APPENDECTOMY    . BACK SURGERY  09/26/2018   L4-5 PLIF by Dr. Arnoldo Morale  . CARDIAC CATHETERIZATION    . CHOLECYSTECTOMY    . EYE SURGERY    . FOOT SURGERY Right    pin removed left  . laser vein surgery    . NASAL SINUS SURGERY    . RIGHT/LEFT HEART CATH AND CORONARY ANGIOGRAPHY N/A 12/06/2017   Procedure: RIGHT/LEFT HEART CATH AND CORONARY ANGIOGRAPHY;  Surgeon: Yolonda Kida, MD;  Location: [facility];  Service: Cardiovascular;  Laterality: N/A;  . TONSILLECTOMY      Home Medications:  Allergies as of 06/02/2020      Reactions   Colesevelam Nausea And Vomiting   WELCHOL   Levofloxacin Hives   Benazepril Other (See Comments)   Methylisothiazolinone Other (See Comments)   Positive on allergy test   Thimerosal Other (See Comments)   Positive on allergy test   Amoxicillin-pot Clavulanate Nausea Only   Did it involve swelling of the face/tongue/throat, SOB, or low BP? No Did it involve sudden or severe rash/hives, skin peeling, or any reaction on the inside of your mouth or nose? No Did you need to seek medical attention at a hospital or doctor's office? Unknown When did it last happen?unknown If all above answers are "NO", may proceed with cephalosporin use.   Latex Rash   Family history of latex allergy   Simvastatin Itching      Medication List       Accurate as of June 02, 2020  1:40 PM. If you have any questions, ask your nurse or doctor.        STOP taking these medications   Estriol Powd Stopped by: Reece Packer, MD   traMADol 50 MG tablet Commonly known as: ULTRAM Stopped by: Reece Packer, MD     TAKE these medications   acetaminophen 650 MG CR tablet Commonly known as: TYLENOL Take 650-1,300 mg by mouth every 8 (eight) hours as needed for pain.   azelastine 0.1 % nasal spray Commonly known as: ASTELIN Place 1 spray into both nostrils daily. Use in each nostril as directed   chlorzoxazone 500 MG tablet Commonly  known as: PARAFON Take 1 tablet (500 mg total) by mouth 4 (four) times daily as needed for muscle spasms.   clotrimazole-betamethasone cream Commonly known as: LOTRISONE Apply externally BID prn sx up to 2 wks   CoQ-10 100 MG Caps Take 100 mg by mouth daily.   CRANBERRY PO Take 1 capsule by mouth daily.   Cyanocobalamin 1000 MCG Subl Place 500 mcg under the tongue 2 (two) times a day.   D-Mannose Powd Take by mouth.   DHEA 10 MG Caps Take 15 mg by mouth daily.   EPINEPHrine 0.3 mg/0.3 mL Soaj injection Commonly known as: EPI-PEN   estradiol 0.1 MG/GM vaginal cream Commonly known as: ESTRACE Place vaginally.   fexofenadine 60 MG tablet Commonly known as: ALLEGRA  Take 60 mg by mouth daily.   glucosamine-chondroitin 500-400 MG tablet Take 1 tablet by mouth 3 (three) times daily.   levothyroxine 75 MCG tablet Commonly known as: SYNTHROID TAKE ONE TABLET EACH MORNING BEFORE BREAKFAST   losartan 100 MG tablet Commonly known as: COZAAR Take 1 tablet (100 mg total) by mouth daily.   Lutein 20 MG Caps Take 1 capsule by mouth daily.   LUTEIN-ZEAXANTHIN PO Take 1 tablet by mouth at bedtime.   multivitamin with minerals Tabs tablet Take 1 tablet by mouth every evening.   Probiotic Caps Take 1 capsule by mouth at bedtime.   progesterone (bulk) Powd Apply 1 application topically See admin instructions. 4% compound- topically   triamcinolone 55 MCG/ACT Aero nasal inhaler Commonly known as: Nasacort Allergy 24HR Place 2 sprays daily into the nose.   Turmeric Powd Take by mouth.   Ubiquinol 100 MG Caps Take 1 capsule by mouth daily.   Vitamin D3 50 MCG (2000 UT) Tabs Take 2,000 Units by mouth daily.       Allergies:  Allergies  Allergen Reactions  . Colesevelam Nausea And Vomiting    WELCHOL   . Levofloxacin Hives  . Benazepril Other (See Comments)  . Methylisothiazolinone Other (See Comments)    Positive on allergy test  . Thimerosal Other (See  Comments)    Positive on allergy test  . Amoxicillin-Pot Clavulanate Nausea Only    Did it involve swelling of the face/tongue/throat, SOB, or low BP? No Did it involve sudden or severe rash/hives, skin peeling, or any reaction on the inside of your mouth or nose? No Did you need to seek medical attention at a hospital or doctor's office? Unknown When did it last happen?unknown If all above answers are "NO", may proceed with cephalosporin use.   . Latex Rash    Family history of latex allergy  . Simvastatin Itching    Family History: Family History  Problem Relation Age of Onset  . Cancer Mother        lung  . CAD Mother   . Stroke Father   . Breast cancer Sister        51s  . Varicose Veins Son   . Heart disease Maternal Grandmother   . Cancer Paternal Grandmother        throat  . Emphysema Paternal Grandfather   . Fibromyalgia Sister   . Brain cancer Sister   . Breast cancer Other     Social History:  reports that she has never smoked. She has never used smokeless tobacco. She reports that she does not drink alcohol and does not use drugs.  ROS:                                        Physical Exam: BP (!) 180/77   Pulse 73   LMP  (LMP Unknown)   Constitutional:  Alert and oriented, No acute distress.  Laboratory Data: Lab Results  Component Value Date   WBC 6.8 04/11/2020   HGB 13.5 04/11/2020   HCT 40.4 04/11/2020   MCV 94 04/11/2020   PLT 198 04/11/2020    Lab Results  Component Value Date   CREATININE 1.14 (H) 04/11/2020    No results found for: PSA  No results found for: TESTOSTERONE  No results found for: HGBA1C  Urinalysis    Component Value Date/Time   COLORURINE YELLOW 09/30/2018  [place] 04/11/2020 0921   LABSPEC 1.020 09/30/2018 0927   PHURINE 5.0 09/30/2018 0927   GLUCOSEU Negative 04/11/2020 0921   HGBUR NEGATIVE 09/30/2018 0927   BILIRUBINUR Negative 04/11/2020 0921   KETONESUR  NEGATIVE 09/30/2018 0927   PROTEINUR Negative 04/11/2020 0921   PROTEINUR NEGATIVE 09/30/2018 0927   NITRITE Negative 04/11/2020 0921   NITRITE NEGATIVE 09/30/2018 0927   LEUKOCYTESUR 2+ (A) 04/11/2020 0921   LEUKOCYTESUR NEGATIVE 09/30/2018 [id number]    Pertinent Imaging:   Assessment & Plan: Picture was drawn.  Referral to Dr. Louis Meckel for robotic sacrocolpopexy and hysterectomy discussed.  The patient understands she is at increased risk of stress incontinence following the procedure but should not have any sling or bulking treatment at the time.  In my opinion the risk is low but sometimes it can still be severe.  It could always be treated with a staged procedure if Denovo stress incontinence occurs.  Based upon large capacity bladder and risk of retention and possible neurogenic bladder conservative management of occult stress incontinence recommend  Her residual at the time of urodynamics may be falsely positive and it can be rechecked after a physiologic void in the office by Dr. Louis Meckel  I agree with the patient that she had seems to have a very sensitive bladder that likes to go into retention.  She understands that the chance of retention with a biotic sacrocolpopexy would be very rare but not impossible.  Conceptually it should improve emptying  The prolapse is significantly affecting her quality life.  I can appreciate why she is a little bit locked-in.  She would like to see Dr. Lemmie Evens.  I will set up the referral and copy these note  General risks and procedure and postop course described     There are no diagnoses linked to this encounter.  No follow-ups on file.  Reece Packer, MD  [facility] [address], [place],  [address] [phone]

## 2020-06-03 DIAGNOSIS — M810 Age-related osteoporosis without current pathological fracture: Secondary | ICD-10-CM | POA: Diagnosis not present

## 2020-06-03 DIAGNOSIS — M1711 Unilateral primary osteoarthritis, right knee: Secondary | ICD-10-CM | POA: Diagnosis not present

## 2020-06-03 DIAGNOSIS — M1712 Unilateral primary osteoarthritis, left knee: Secondary | ICD-10-CM | POA: Diagnosis not present

## 2020-06-04 DIAGNOSIS — Z20822 Contact with and (suspected) exposure to covid-19: Secondary | ICD-10-CM | POA: Diagnosis not present

## 2020-06-04 DIAGNOSIS — Z03818 Encounter for observation for suspected exposure to other biological agents ruled out: Secondary | ICD-10-CM | POA: Diagnosis not present

## 2020-06-06 DIAGNOSIS — N8111 Cystocele, midline: Secondary | ICD-10-CM | POA: Diagnosis not present

## 2020-06-06 DIAGNOSIS — N3946 Mixed incontinence: Secondary | ICD-10-CM | POA: Diagnosis not present

## 2020-06-10 DIAGNOSIS — M25462 Effusion, left knee: Secondary | ICD-10-CM | POA: Diagnosis not present

## 2020-06-10 DIAGNOSIS — N183 Chronic kidney disease, stage 3 unspecified: Secondary | ICD-10-CM | POA: Diagnosis not present

## 2020-06-10 DIAGNOSIS — I2510 Atherosclerotic heart disease of native coronary artery without angina pectoris: Secondary | ICD-10-CM | POA: Diagnosis not present

## 2020-06-10 DIAGNOSIS — I10 Essential (primary) hypertension: Secondary | ICD-10-CM | POA: Diagnosis not present

## 2020-06-10 DIAGNOSIS — M1712 Unilateral primary osteoarthritis, left knee: Secondary | ICD-10-CM | POA: Diagnosis not present

## 2020-06-23 ENCOUNTER — Other Ambulatory Visit: Payer: Self-pay | Admitting: Urology

## 2020-06-25 MED ORDER — FLEET ENEMA 7-19 GM/118ML RE ENEM: 1.0000 | ENEMA | Freq: Once | RECTAL | Status: DC

## 2020-07-14 ENCOUNTER — Encounter: Payer: Self-pay | Admitting: Physician Assistant

## 2020-07-14 ENCOUNTER — Other Ambulatory Visit: Payer: Self-pay

## 2020-07-14 ENCOUNTER — Ambulatory Visit (INDEPENDENT_AMBULATORY_CARE_PROVIDER_SITE_OTHER): Payer: Medicare Other | Admitting: Physician Assistant

## 2020-07-14 VITALS — BP 149/63 | HR 76 | Ht 63.94 in | Wt 160.0 lb

## 2020-07-14 DIAGNOSIS — R338 Other retention of urine: Secondary | ICD-10-CM | POA: Diagnosis not present

## 2020-07-14 DIAGNOSIS — N390 Urinary tract infection, site not specified: Secondary | ICD-10-CM

## 2020-07-14 LAB — BLADDER SCAN AMB NON-IMAGING

## 2020-07-14 MED ORDER — SULFAMETHOXAZOLE-TRIMETHOPRIM 800-160 MG PO TABS: 1.0000 | ORAL_TABLET | Freq: Two times a day (BID) | ORAL | 0 refills | Status: AC

## 2020-07-14 NOTE — Progress Notes (Unsigned)
07/14/2020 10:48 AM   Julia Deleon 14-Oct-1945 [id number]  CC: Chief Complaint  Patient presents with  . Recurrent UTI    HPI: Julia Deleon is a 75 y.o. female with PMH recurrent UTI, cystocele, and postoperative urinary retention who presents today for evaluation of possible UTI.   Today she reports a 1 day history of frequency, dysuria, difficulty urinating, and urinary leakage.  In-office UA today positive for 2+ blood, 1+ protein, nitrites, and 3+ leukocyte ester; urine microscopy with >30 WBCs/HPF, 3-10 RBCs/HPF, and many bacteria. PVR 564mL.  PMH: Past Medical History:  Diagnosis Date  . Arthritis   . COPD (chronic obstructive pulmonary disease) ([place])   . Family history of adverse reaction to anesthesia    father was slow to wake up  . Fuch's endothelial dystrophy   . Hyperlipidemia   . Hypothyroidism   . PONV (postoperative nausea and vomiting)    slow to wake up and PONV    Surgical History: Past Surgical History:  Procedure Laterality Date  . APPENDECTOMY    . BACK SURGERY  09/26/2018   L4-5 PLIF by Dr. Arnoldo Morale  . CARDIAC CATHETERIZATION    . CHOLECYSTECTOMY    . EYE SURGERY    . FOOT SURGERY Right    pin removed left  . laser vein surgery    . NASAL SINUS SURGERY    . RIGHT/LEFT HEART CATH AND CORONARY ANGIOGRAPHY N/A 12/06/2017   Procedure: RIGHT/LEFT HEART CATH AND CORONARY ANGIOGRAPHY;  Surgeon: Yolonda Kida, MD;  Location: [facility];  Service: Cardiovascular;  Laterality: N/A;  . TONSILLECTOMY      Home Medications:  Allergies as of 07/14/2020      Reactions   Colesevelam Nausea And Vomiting   WELCHOL   Levofloxacin Hives   Amoxicillin    Benazepril Other (See Comments)   Methylisothiazolinone Other (See Comments)   Positive on allergy test   Thimerosal Other (See Comments)   Positive on allergy test   Amoxicillin-pot Clavulanate Nausea Only   Did it involve swelling of the face/tongue/throat, SOB, or low BP? No Did it  involve sudden or severe rash/hives, skin peeling, or any reaction on the inside of your mouth or nose? No Did you need to seek medical attention at a hospital or doctor's office? Unknown When did it last happen?unknown If all above answers are "NO", may proceed with cephalosporin use.   Latex Rash   Family history of latex allergy   Simvastatin Itching      Medication List       Accurate as of July 14, 2020 10:48 AM. If you have any questions, ask your nurse or doctor.        acetaminophen 650 MG CR tablet Commonly known as: TYLENOL Take 650-1,300 mg by mouth every 8 (eight) hours as needed for pain.   azelastine 0.1 % nasal spray Commonly known as: ASTELIN Place 1 spray into both nostrils daily. Use in each nostril as directed   chlorzoxazone 500 MG tablet Commonly known as: PARAFON Take 1 tablet (500 mg total) by mouth 4 (four) times daily as needed for muscle spasms.   clotrimazole-betamethasone cream Commonly known as: LOTRISONE Apply externally BID prn sx up to 2 wks   CoQ-10 100 MG Caps Take 100 mg by mouth daily.   CRANBERRY PO Take 1 capsule by mouth daily.   Cyanocobalamin 1000 MCG Subl Place 500 mcg under the tongue 2 (two) times a day.   D-Mannose Powd  Take by mouth.   DHEA 10 MG Caps Take 15 mg by mouth daily.   EPINEPHrine 0.3 mg/0.3 mL Soaj injection Commonly known as: EPI-PEN   estradiol 0.1 MG/GM vaginal cream Commonly known as: ESTRACE Place vaginally.   fexofenadine 60 MG tablet Commonly known as: ALLEGRA Take 60 mg by mouth daily.   glucosamine-chondroitin 500-400 MG tablet Take 1 tablet by mouth 3 (three) times daily.   levothyroxine 75 MCG tablet Commonly known as: SYNTHROID TAKE ONE TABLET EACH MORNING BEFORE BREAKFAST   losartan 100 MG tablet Commonly known as: COZAAR Take 1 tablet (100 mg total) by mouth daily.   Lutein 20 MG Caps Take 1 capsule by mouth daily.   LUTEIN-ZEAXANTHIN PO Take 1 tablet by mouth at  bedtime.   multivitamin with minerals Tabs tablet Take 1 tablet by mouth every evening.   Probiotic Caps Take 1 capsule by mouth at bedtime.   progesterone (bulk) Powd Apply 1 application topically See admin instructions. 4% compound- topically   triamcinolone 55 MCG/ACT Aero nasal inhaler Commonly known as: Nasacort Allergy 24HR Place 2 sprays daily into the nose.   Turmeric Powd Take by mouth.   Ubiquinol 100 MG Caps Take 1 capsule by mouth daily.   Vitamin D3 50 MCG (2000 UT) Tabs Take 2,000 Units by mouth daily.       Allergies:  Allergies  Allergen Reactions  . Colesevelam Nausea And Vomiting    WELCHOL   . Levofloxacin Hives  . Amoxicillin   . Benazepril Other (See Comments)  . Methylisothiazolinone Other (See Comments)    Positive on allergy test  . Thimerosal Other (See Comments)    Positive on allergy test  . Amoxicillin-Pot Clavulanate Nausea Only    Did it involve swelling of the face/tongue/throat, SOB, or low BP? No Did it involve sudden or severe rash/hives, skin peeling, or any reaction on the inside of your mouth or nose? No Did you need to seek medical attention at a hospital or doctor's office? Unknown When did it last happen?unknown If all above answers are "NO", may proceed with cephalosporin use.   . Latex Rash    Family history of latex allergy  . Simvastatin Itching    Family History: Family History  Problem Relation Age of Onset  . Cancer Mother        lung  . CAD Mother   . Stroke Father   . Breast cancer Sister        90s  . Varicose Veins Son   . Heart disease Maternal Grandmother   . Cancer Paternal Grandmother        throat  . Emphysema Paternal Grandfather   . Fibromyalgia Sister   . Brain cancer Sister   . Breast cancer Other     Social History:   reports that she has never smoked. She has never used smokeless tobacco. She reports that she does not drink alcohol and does not use drugs.  Physical Exam: BP  (!) 149/63 (BP Location: Left Arm, Patient Position: Sitting, Cuff Size: Large)   Pulse 76   Ht 5' 3.94" (1.624 m)   Wt 160 lb (72.6 kg)   LMP  (LMP Unknown)   BMI 27.52 kg/m   Constitutional:  Alert and oriented, no acute distress, nontoxic appearing HEENT: [place], AT Cardiovascular: No clubbing, cyanosis, or edema Respiratory: Normal respiratory effort, no increased work of breathing Skin: No rashes, bruises or suspicious lesions Neurologic: Grossly intact, no focal deficits, moving all 4 extremities  Psychiatric: Normal mood and affect  Laboratory Data: Results for orders placed or performed in visit on 07/14/20  CULTURE, URINE COMPREHENSIVE   Specimen: Urine   UR  Result Value Ref Range   Urine Culture, Comprehensive Final report (A)    Organism ID, Bacteria Comment (A)    ANTIMICROBIAL SUSCEPTIBILITY Comment   Microscopic Examination   Urine  Result Value Ref Range   WBC, UA >30 (A) 0 - 5 /hpf   RBC 3-10 (A) 0 - 2 /hpf   Epithelial Cells (non renal) 0-10 0 - 10 /hpf   Bacteria, UA Many (A) None seen/Few  Urinalysis, Complete  Result Value Ref Range   Specific Gravity, UA 1.010 1.005 - 1.030   pH, UA 5.5 5.0 - 7.5   Color, UA Straw Yellow   Appearance Ur Cloudy (A) Clear   Leukocytes,UA 3+ (A) Negative   Protein,UA 1+ (A) Negative/Trace   Glucose, UA Negative Negative   Ketones, UA Negative Negative   RBC, UA 2+ (A) Negative   Bilirubin, UA Negative Negative   Urobilinogen, Ur 0.2 0.2 - 1.0 mg/dL   Nitrite, UA Positive (A) Negative   Microscopic Examination See below:   BLADDER SCAN AMB NON-IMAGING  Result Value Ref Range   Scan Result 55mL    Assessment & Plan:   1. Recurrent UTI UA grossly infected today, will start empiric Bactrim and send for culture for further evaluation.  She will require repeat UA in 1 week to prove resolution of MH.  Notably, will extend Bactrim to 7 days in light of her incomplete emptying as below. - Urinalysis, Complete - CULTURE,  URINE COMPREHENSIVE - sulfamethoxazole-trimethoprim (BACTRIM DS) 800-160 MG tablet; Take 1 tablet by mouth 2 (two) times daily for 7 days.  Dispense: 14 tablet; Refill: 0  2. Acute urinary retention Patient noted to be in retention today, though symptoms are more consistent with acute cystitis versus retention.  I offered her Foley catheter placement versus CIC teaching today and she declined both.  I explained that the risks of refusing bladder decompression include hydronephrosis and renal damage.  States that she understand these risks and wishes to defer.  We will plan for repeat PVR in 2 days instead.  If bladder scan is significantly elevated at that time, we will strongly recommend Foley placement.  Counseled patient to proceed to the emergency department in the interim if she develops lower abdominal discomfort and the inability to urinate.  She expressed understanding. - BLADDER SCAN AMB NON-IMAGING   Return in about 2 days (around 07/16/2020) for Repeat PVR + 1 week lab visit for UA.  Debroah Loop, PA-C  [facility] [address], [place], [place] [address] [phone]

## 2020-07-16 ENCOUNTER — Ambulatory Visit: Payer: Self-pay | Admitting: Physician Assistant

## 2020-07-16 LAB — URINALYSIS, COMPLETE
Bilirubin, UA: NEGATIVE
Glucose, UA: NEGATIVE
Ketones, UA: NEGATIVE
Nitrite, UA: POSITIVE — AB
Specific Gravity, UA: 1.01 (ref 1.005–1.030)
Urobilinogen, Ur: 0.2 mg/dL (ref 0.2–1.0)
pH, UA: 5.5 (ref 5.0–7.5)

## 2020-07-16 LAB — MICROSCOPIC EXAMINATION: WBC, UA: >30 /hpf — AB (ref 0–5)

## 2020-07-16 LAB — CULTURE, URINE COMPREHENSIVE

## 2020-07-17 ENCOUNTER — Other Ambulatory Visit: Payer: Self-pay

## 2020-07-17 ENCOUNTER — Ambulatory Visit (INDEPENDENT_AMBULATORY_CARE_PROVIDER_SITE_OTHER): Payer: Medicare Other | Admitting: Physician Assistant

## 2020-07-17 ENCOUNTER — Encounter: Payer: Self-pay | Admitting: Physician Assistant

## 2020-07-17 VITALS — BP 145/63 | HR 86 | Ht 64.0 in | Wt 160.0 lb

## 2020-07-17 DIAGNOSIS — R338 Other retention of urine: Secondary | ICD-10-CM | POA: Diagnosis not present

## 2020-07-17 DIAGNOSIS — R339 Retention of urine, unspecified: Secondary | ICD-10-CM | POA: Diagnosis not present

## 2020-07-17 DIAGNOSIS — N3001 Acute cystitis with hematuria: Secondary | ICD-10-CM

## 2020-07-17 LAB — BLADDER SCAN AMB NON-IMAGING

## 2020-07-17 MED ORDER — FLUCONAZOLE 150 MG PO TABS: 150.0000 mg | ORAL_TABLET | Freq: Once | ORAL | 0 refills | Status: AC

## 2020-07-17 NOTE — Progress Notes (Signed)
07/17/2020 11:58 AM   Julia Deleon 06-06-1945 [id number]  CC: Chief Complaint  Patient presents with  . Recurrent UTI    Follow up   HPI: Julia Deleon is a 75 y.o. female with PMH recurrent UTI, cystocele, and postoperative urinary retention who presents today for repeat PVR.  I saw her in clinic 3 days ago for evaluation of possible UTI.  UA was grossly infected at that time and PVR elevated at 503 mL.  Patient declined Foley catheter versus self-catheterization at that time and I started her on empiric Bactrim.  Her urine culture has since resulted with ampicillin resistant E. coli.  Today she denies lower abdominal pain.  She admits that she does not believe she is emptying her bladder fully, however she is not uncomfortable and reports no change compared to her last clinic visit.  She again reports being disinterested in Foley catheter placement versus self-catheterization.  Additionally, she requests Diflucan due to her history of vulvovaginal candidiasis associated with antibiotic use.   Notably, she reports her upcoming sacrocolpopexy and supracervical hysterectomy with bilateral salpingo-oophorectomy with Dr. Louis Meckel has been moved up from late April to 07/30/2020.  She is looking forward to this procedure.    PVR 439mL.  PMH: Past Medical History:  Diagnosis Date  . Arthritis   . COPD (chronic obstructive pulmonary disease) ([place])   . Family history of adverse reaction to anesthesia    father was slow to wake up  . Fuch's endothelial dystrophy   . Hyperlipidemia   . Hypothyroidism   . PONV (postoperative nausea and vomiting)    slow to wake up and PONV    Surgical History: Past Surgical History:  Procedure Laterality Date  . APPENDECTOMY    . BACK SURGERY  09/26/2018   L4-5 PLIF by Dr. Arnoldo Morale  . CARDIAC CATHETERIZATION    . CHOLECYSTECTOMY    . EYE SURGERY    . FOOT SURGERY Right    pin removed left  . laser vein surgery    . NASAL SINUS SURGERY    .  RIGHT/LEFT HEART CATH AND CORONARY ANGIOGRAPHY N/A 12/06/2017   Procedure: RIGHT/LEFT HEART CATH AND CORONARY ANGIOGRAPHY;  Surgeon: Yolonda Kida, MD;  Location: [facility];  Service: Cardiovascular;  Laterality: N/A;  . TONSILLECTOMY      Home Medications:  Allergies as of 07/17/2020      Reactions   Colesevelam Nausea And Vomiting   WELCHOL   Levofloxacin Hives   Amoxicillin    Benazepril Other (See Comments)   Methylisothiazolinone Other (See Comments)   Positive on allergy test   Thimerosal Other (See Comments)   Positive on allergy test   Amoxicillin-pot Clavulanate Nausea Only   Did it involve swelling of the face/tongue/throat, SOB, or low BP? No Did it involve sudden or severe rash/hives, skin peeling, or any reaction on the inside of your mouth or nose? No Did you need to seek medical attention at a hospital or doctor's office? Unknown When did it last happen?unknown If all above answers are "NO", may proceed with cephalosporin use.   Latex Rash   Family history of latex allergy   Simvastatin Itching      Medication List       Accurate as of July 17, 2020 11:58 AM. If you have any questions, ask your nurse or doctor.        acetaminophen 650 MG CR tablet Commonly known as: TYLENOL Take 650-1,300 mg by mouth every  8 (eight) hours as needed for pain.   azelastine 0.1 % nasal spray Commonly known as: ASTELIN Place 1 spray into both nostrils daily. Use in each nostril as directed   chlorzoxazone 500 MG tablet Commonly known as: PARAFON Take 1 tablet (500 mg total) by mouth 4 (four) times daily as needed for muscle spasms.   CoQ-10 100 MG Caps Take 100 mg by mouth daily.   CRANBERRY PO Take 1 capsule by mouth daily.   Cyanocobalamin 1000 MCG Subl Place 500 mcg under the tongue 2 (two) times a day.   DHEA 10 MG Caps Take 10 mg by mouth daily.   EPINEPHrine 0.3 mg/0.3 mL Soaj injection Commonly known as: EPI-PEN Inject 0.3 mg into  the muscle as needed for anaphylaxis.   estradiol 0.1 MG/GM vaginal cream Commonly known as: ESTRACE Place vaginally.   fexofenadine 60 MG tablet Commonly known as: ALLEGRA Take 60 mg by mouth daily.   fluconazole 150 MG tablet Commonly known as: DIFLUCAN Take 1 tablet (150 mg total) by mouth once for 1 dose. Started by: Debroah Loop, PA-C   glucosamine-chondroitin 500-400 MG tablet Take 1 tablet by mouth in the morning and at bedtime.   levothyroxine 75 MCG tablet Commonly known as: SYNTHROID TAKE ONE TABLET EACH MORNING BEFORE BREAKFAST What changed:   how much to take  how to take this  when to take this   losartan 100 MG tablet Commonly known as: COZAAR Take 1 tablet (100 mg total) by mouth daily.   LUTEIN-ZEAXANTHIN PO Take 1 tablet by mouth at bedtime.   multivitamin with minerals Tabs tablet Take 1 tablet by mouth every evening.   Probiotic Caps Take 1 capsule by mouth daily.   progesterone (bulk) Powd Apply 1 application topically See admin instructions. 4% compound- topically   sulfamethoxazole-trimethoprim 800-160 MG tablet Commonly known as: BACTRIM DS Take 1 tablet by mouth 2 (two) times daily for 7 days.   triamcinolone 55 MCG/ACT Aero nasal inhaler Commonly known as: Nasacort Allergy 24HR Place 2 sprays daily into the nose. What changed:   when to take this  reasons to take this   Turmeric 500 MG Caps Take 500 mg by mouth daily.   Ubiquinol 100 MG Caps Take 100 mg by mouth daily.   Vitamin D3 50 MCG (2000 UT) Tabs Take 2,000 Units by mouth daily.       Allergies:  Allergies  Allergen Reactions  . Colesevelam Nausea And Vomiting    WELCHOL   . Levofloxacin Hives  . Amoxicillin   . Benazepril Other (See Comments)  . Methylisothiazolinone Other (See Comments)    Positive on allergy test  . Thimerosal Other (See Comments)    Positive on allergy test  . Amoxicillin-Pot Clavulanate Nausea Only    Did it involve  swelling of the face/tongue/throat, SOB, or low BP? No Did it involve sudden or severe rash/hives, skin peeling, or any reaction on the inside of your mouth or nose? No Did you need to seek medical attention at a hospital or doctor's office? Unknown When did it last happen?unknown If all above answers are "NO", may proceed with cephalosporin use.   . Latex Rash    Family history of latex allergy  . Simvastatin Itching    Family History: Family History  Problem Relation Age of Onset  . Cancer Mother        lung  . CAD Mother   . Stroke Father   . Breast cancer Sister  80s  . Varicose Veins Son   . Heart disease Maternal Grandmother   . Cancer Paternal Grandmother        throat  . Emphysema Paternal Grandfather   . Fibromyalgia Sister   . Brain cancer Sister   . Breast cancer Other     Social History:   reports that she has never smoked. She has never used smokeless tobacco. She reports that she does not drink alcohol and does not use drugs.  Physical Exam: BP (!) 145/63   Pulse 86   Ht 5\' 4"  (1.626 m)   Wt 160 lb (72.6 kg)   LMP  (LMP Unknown)   BMI 27.46 kg/m   Constitutional:  Alert and oriented, no acute distress, nontoxic appearing HEENT: [place], AT Cardiovascular: No clubbing, cyanosis, or edema Respiratory: Normal respiratory effort, no increased work of breathing Skin: No rashes, bruises or suspicious lesions Neurologic: Grossly intact, no focal deficits, moving all 4 extremities Psychiatric: Normal mood and affect  Laboratory Data: Results for orders placed or performed in visit on 07/17/20  BLADDER SCAN AMB NON-IMAGING  Result Value Ref Range   Scan Result 466ml    Assessment & Plan:   1. Incomplete bladder emptying Stably elevated PVR in this patient with a known history of incomplete bladder emptying.  I again offered her Foley catheter placement versus CIC and she declined both.  She is asymptomatic of this today and we reviewed return  precautions.  She expressed understanding. - BLADDER SCAN AMB NON-IMAGING  2. Acute cystitis with hematuria On culture appropriate Bactrim.  Patient to continue this.  Prescribing 1 dose of Diflucan per patient request and counseled her not to take this if he does not develop symptoms.  She expressed understanding. - fluconazole (DIFLUCAN) 150 MG tablet; Take 1 tablet (150 mg total) by mouth once for 1 dose.  Dispense: 1 tablet; Refill: 0  Return if symptoms worsen or fail to improve.  Debroah Loop, PA-C  [facility] [address] [place], [place] [address] [phone]

## 2020-07-21 ENCOUNTER — Other Ambulatory Visit: Payer: Medicare Other

## 2020-07-21 ENCOUNTER — Other Ambulatory Visit: Payer: Self-pay

## 2020-07-21 DIAGNOSIS — N390 Urinary tract infection, site not specified: Secondary | ICD-10-CM

## 2020-07-21 LAB — URINALYSIS, COMPLETE
Bilirubin, UA: NEGATIVE
Glucose, UA: NEGATIVE
Ketones, UA: NEGATIVE
Nitrite, UA: NEGATIVE
Protein,UA: NEGATIVE
RBC, UA: NEGATIVE
Specific Gravity, UA: 1.02 (ref 1.005–1.030)
Urobilinogen, Ur: 0.2 mg/dL (ref 0.2–1.0)
pH, UA: 5.5 (ref 5.0–7.5)

## 2020-07-21 LAB — MICROSCOPIC EXAMINATION

## 2020-07-21 NOTE — Progress Notes (Signed)
DUE TO COVID-19 ONLY ONE VISITOR IS ALLOWED TO COME WITH YOU AND STAY IN THE WAITING ROOM ONLY DURING PRE OP AND PROCEDURE DAY OF SURGERY. THE 1 VISITOR  MAY VISIT WITH YOU AFTER SURGERY IN YOUR PRIVATE ROOM DURING VISITING HOURS ONLY!  YOU NEED TO HAVE A COVID 19 TEST ON__3/19/2022 _____ @_______ , THIS TEST MUST BE DONE BEFORE SURGERY,  COVID TESTING SITE [address] [place] [address], IT IS ON THE RIGHT GOING OUT [address] APPROXIMATELY  2 MINUTES PAST [employer] ON THE RIGHT. ONCE YOUR COVID TEST IS COMPLETED,  PLEASE BEGIN THE QUARANTINE INSTRUCTIONS AS OUTLINED IN YOUR HANDOUT.                Julia Deleon  07/21/2020   Your procedure is scheduled on: 07/30/2020    Report to [facility] Main  Entrance   Report to admitting at    0530AM     Call this number if you have problems the morning of surgery [phone]    Remember: Do not eat food , candy gum or mints :After Midnight. You may have clear liquids from midnight until  0430SM    CLEAR LIQUID DIET   Foods Allowed                                                                       Coffee and tea, regular and decaf                              Plain Jell-O any favor except red or purple                                            Fruit ices (not with fruit pulp)                                      Iced Popsicles                                     Carbonated beverages, regular and diet                                    Cranberry, grape and apple juices Sports drinks like Gatorade Lightly seasoned clear broth or consume(fat free) Sugar, honey syrup   _____________________________________________________________________    BRUSH YOUR TEETH MORNING OF SURGERY AND RINSE YOUR MOUTH OUT, NO CHEWING GUM CANDY OR MINTS.     Take these medicines the morning of surgery with A SIP OF WATER: NASAL SPRAY IF NEEDED, ALLEGRA, SYNTHROID   DO NOT TAKE ANY DIABETIC MEDICATIONS DAY OF YOUR  SURGERY                               You may not have  any metal on your body including hair pins and              piercings  Do not wear jewelry, make-up, lotions, powders or perfumes, deodorant             Do not wear nail polish on your fingernails.  Do not shave  48 hours prior to surgery.              Men may shave face and neck.   Do not bring valuables to the hospital. [place].  Contacts, dentures or bridgework may not be worn into surgery.  Leave suitcase in the car. After surgery it may be brought to your room.     Patients discharged the day of surgery will not be allowed to drive home. IF YOU ARE HAVING SURGERY AND GOING HOME THE SAME DAY, YOU MUST HAVE AN ADULT TO DRIVE YOU HOME AND BE WITH YOU FOR 24 HOURS. YOU MAY GO HOME BY TAXI OR UBER OR ORTHERWISE, BUT AN ADULT MUST ACCOMPANY YOU HOME AND STAY WITH YOU FOR 24 HOURS.  Name and phone number of your driver:  Special Instructions: N/A              Please read over the following fact sheets you were given: _____________________________________________________________________  [facility] - Preparing for Surgery Before surgery, you can play an important role.  Because skin is not sterile, your skin needs to be as free of germs as possible.  You can reduce the number of germs on your skin by washing with CHG (chlorahexidine gluconate) soap before surgery.  CHG is an antiseptic cleaner which kills germs and bonds with the skin to continue killing germs even after washing. Please DO NOT use if you have an allergy to CHG or antibacterial soaps.  If your skin becomes reddened/irritated stop using the CHG and inform your nurse when you arrive at Short Stay. Do not shave (including legs and underarms) for at least 48 hours prior to the first CHG shower.  You may shave your face/neck. Please follow these instructions carefully:  1.  Shower with CHG Soap the night before surgery and the   morning of Surgery.  2.  If you choose to wash your hair, wash your hair first as usual with your  normal  shampoo.  3.  After you shampoo, rinse your hair and body thoroughly to remove the  shampoo.                           4.  Use CHG as you would any other liquid soap.  You can apply chg directly  to the skin and wash                       Gently with a scrungie or clean washcloth.  5.  Apply the CHG Soap to your body ONLY FROM THE NECK DOWN.   Do not use on face/ open                           Wound or open sores. Avoid contact with eyes, ears mouth and genitals (private parts).                       Wash  face,  Genitals (private parts) with your normal soap.             6.  Wash thoroughly, paying special attention to the area where your surgery  will be performed.  7.  Thoroughly rinse your body with warm water from the neck down.  8.  DO NOT shower/wash with your normal soap after using and rinsing off  the CHG Soap.                9.  Pat yourself dry with a clean towel.            10.  Wear clean pajamas.            11.  Place clean sheets on your bed the night of your first shower and do not  sleep with pets. Day of Surgery : Do not apply any lotions/deodorants the morning of surgery.  Please wear clean clothes to the hospital/surgery center.  FAILURE TO FOLLOW THESE INSTRUCTIONS MAY RESULT IN THE CANCELLATION OF YOUR SURGERY PATIENT SIGNATURE_________________________________  NURSE SIGNATURE__________________________________  ________________________________________________________________________

## 2020-07-23 DIAGNOSIS — N8111 Cystocele, midline: Secondary | ICD-10-CM | POA: Diagnosis not present

## 2020-07-24 ENCOUNTER — Telehealth: Payer: Self-pay

## 2020-07-24 ENCOUNTER — Other Ambulatory Visit: Payer: Self-pay

## 2020-07-24 ENCOUNTER — Encounter (HOSPITAL_COMMUNITY): Payer: Self-pay

## 2020-07-24 ENCOUNTER — Encounter (HOSPITAL_COMMUNITY)
Admission: RE | Admit: 2020-07-24 | Discharge: 2020-07-24 | Disposition: A | Discharge status: Home / Self Care | Payer: Medicare Other | Source: Ambulatory Visit | Attending: Urology | Admitting: Urology

## 2020-07-24 DIAGNOSIS — Z01818 Encounter for other preprocedural examination: Secondary | ICD-10-CM | POA: Insufficient documentation

## 2020-07-24 HISTORY — DX: Dyspnea, unspecified: R06.00

## 2020-07-24 HISTORY — DX: Pneumonia, unspecified organism: J18.9

## 2020-07-24 HISTORY — DX: Gastro-esophageal reflux disease without esophagitis: K21.9

## 2020-07-24 LAB — COMPREHENSIVE METABOLIC PANEL
ALT: 21 U/L (ref 0–44)
AST: 19 U/L (ref 15–41)
Albumin: 3.9 g/dL (ref 3.5–5.0)
Alkaline Phosphatase: 51 U/L (ref 38–126)
Anion gap: 8 (ref 5–15)
BUN: 28 mg/dL — ABNORMAL HIGH (ref 8–23)
CO2: 25 mmol/L (ref 22–32)
Calcium: 9.3 mg/dL (ref 8.9–10.3)
Chloride: 106 mmol/L (ref 98–111)
Creatinine, Ser: 1.13 mg/dL — ABNORMAL HIGH (ref 0.44–1.00)
GFR, Estimated: 52 mL/min — ABNORMAL LOW (ref >60)
Glucose, Bld: 99 mg/dL (ref 70–99)
Potassium: 4.2 mmol/L (ref 3.5–5.1)
Sodium: 139 mmol/L (ref 135–145)
Total Bilirubin: 0.7 mg/dL (ref 0.3–1.2)
Total Protein: 6.8 g/dL (ref 6.5–8.1)

## 2020-07-24 LAB — CBC
HCT: 39.4 % (ref 36.0–46.0)
Hemoglobin: 12.9 g/dL (ref 12.0–15.0)
MCH: 31.1 pg (ref 26.0–34.0)
MCHC: 32.7 g/dL (ref 30.0–36.0)
MCV: 94.9 fL (ref 80.0–100.0)
Platelets: 227 10*3/uL (ref 150–400)
RBC: 4.15 MIL/uL (ref 3.87–5.11)
RDW: 13.3 % (ref 11.5–15.5)
WBC: 8.3 10*3/uL (ref 4.0–10.5)
nRBC: 0 % (ref 0.0–0.2)

## 2020-07-24 LAB — URINALYSIS, ROUTINE W REFLEX MICROSCOPIC
Bilirubin Urine: NEGATIVE
Glucose, UA: NEGATIVE mg/dL
Hgb urine dipstick: NEGATIVE
Ketones, ur: NEGATIVE mg/dL
Leukocytes,Ua: NEGATIVE
Nitrite: NEGATIVE
Protein, ur: NEGATIVE mg/dL
Specific Gravity, Urine: 1.013 (ref 1.005–1.030)
pH: 5 (ref 5.0–8.0)

## 2020-07-24 NOTE — Telephone Encounter (Signed)
Notified patient as advised, patient verbalized understanding.  

## 2020-07-24 NOTE — Telephone Encounter (Signed)
-----   Message from Debroah Loop, [place] sent at 07/24/2020  3:47 PM EDT ----- Please inform the patient that her microscopic hematuria resolved upon completion of culture appropriate antibiotics.  No further intervention indicated. ----- Message ----- From: Lavone Neri Lab Results In Sent: 07/21/2020   4:37 PM EDT To: Debroah Loop, PA-C

## 2020-07-24 NOTE — Telephone Encounter (Signed)
Notified patient as advised, patient expressed understanding.  

## 2020-07-24 NOTE — Progress Notes (Addendum)
Anesthesia Review:  PCP: DR Christie Beckers, [place]  Cardiologist : none Kidney- DR Holley Raring in [place]  Chest x-ray : EKG :07/24/20 Echo : Stress test: Cardiac Cath :  Activity level: can do a flight of stairs without difficulty  Sleep Study/ CPAP : no  Fasting Blood Sugar :      / Checks Blood Sugar -- times a day:   Blood Thinner/ Instructions /Last Dose: ASA / Instructions/ Last Dose :  U/A done 07/24/20 routed to DR Louis Meckel.  Urine culture done 07/24/2020 routed to DR Louis Meckel

## 2020-07-25 DIAGNOSIS — Z981 Arthrodesis status: Secondary | ICD-10-CM | POA: Diagnosis not present

## 2020-07-25 DIAGNOSIS — G8929 Other chronic pain: Secondary | ICD-10-CM | POA: Diagnosis not present

## 2020-07-25 DIAGNOSIS — M545 Low back pain, unspecified: Secondary | ICD-10-CM | POA: Diagnosis not present

## 2020-07-25 DIAGNOSIS — M47819 Spondylosis without myelopathy or radiculopathy, site unspecified: Secondary | ICD-10-CM | POA: Diagnosis not present

## 2020-07-25 LAB — URINE CULTURE: Culture: NO GROWTH

## 2020-07-28 ENCOUNTER — Other Ambulatory Visit (HOSPITAL_COMMUNITY)
Admission: RE | Admit: 2020-07-28 | Discharge: 2020-07-28 | Disposition: A | Discharge status: Home / Self Care | Payer: Medicare Other | Source: Ambulatory Visit | Attending: Urology | Admitting: Urology

## 2020-07-28 DIAGNOSIS — Z20822 Contact with and (suspected) exposure to covid-19: Secondary | ICD-10-CM | POA: Insufficient documentation

## 2020-07-28 DIAGNOSIS — Z01812 Encounter for preprocedural laboratory examination: Secondary | ICD-10-CM | POA: Insufficient documentation

## 2020-07-29 LAB — SARS CORONAVIRUS 2 (TAT 6-24 HRS): SARS Coronavirus 2: NEGATIVE

## 2020-07-29 NOTE — Anesthesia Preprocedure Evaluation (Addendum)
Anesthesia Evaluation  Patient identified by MRN, date of birth, ID band Patient awake    Reviewed: Allergy & Precautions, NPO status , Patient's Chart, lab work & pertinent test results  History of Anesthesia Complications (+) PONV and history of anesthetic complications  Airway Mallampati: II  TM Distance: >3 FB Neck ROM: Full    Dental no notable dental hx.    Pulmonary COPD,    Pulmonary exam normal        Cardiovascular hypertension, Pt. on medications Normal cardiovascular exam     Neuro/Psych negative neurological ROS  negative psych ROS   GI/Hepatic Neg liver ROS, GERD  ,  Endo/Other  Hypothyroidism   Renal/GU Renal InsufficiencyRenal disease  negative genitourinary   Musculoskeletal  (+) Arthritis ,   Abdominal   Peds  Hematology negative hematology ROS (+)   Anesthesia Other Findings Day of surgery medications reviewed with patient.  Reproductive/Obstetrics negative OB ROS                            Anesthesia Physical Anesthesia Plan  ASA: II  Anesthesia Plan: General   Post-op Pain Management:    Induction: Intravenous  PONV Risk Score and Plan: 4 or greater and Treatment may vary due to age or medical condition, Ondansetron, Dexamethasone, Midazolam and Propofol infusion  Airway Management Planned: Oral ETT  Additional Equipment: None  Intra-op Plan:   Post-operative Plan: Extubation in OR  Informed Consent: I have reviewed the patients History and Physical, chart, labs and discussed the procedure including the risks, benefits and alternatives for the proposed anesthesia with the patient or authorized representative who has indicated his/her understanding and acceptance.     Dental advisory given  Plan Discussed with: CRNA  Anesthesia Plan Comments:        Anesthesia Quick Evaluation

## 2020-07-30 ENCOUNTER — Encounter (HOSPITAL_COMMUNITY): Payer: Self-pay | Admitting: Urology

## 2020-07-30 ENCOUNTER — Ambulatory Visit (HOSPITAL_COMMUNITY): Payer: Medicare Other | Admitting: Anesthesiology

## 2020-07-30 ENCOUNTER — Other Ambulatory Visit: Payer: Self-pay

## 2020-07-30 ENCOUNTER — Inpatient Hospital Stay (HOSPITAL_COMMUNITY)
Admission: RE | Admit: 2020-07-30 | Discharge: 2020-08-01 | DRG: 743 | Disposition: A | Discharge status: Home / Self Care | Payer: Medicare Other | Attending: Urology | Admitting: Urology

## 2020-07-30 ENCOUNTER — Encounter (HOSPITAL_COMMUNITY)
Admission: RE | Disposition: A | Discharge status: Home / Self Care | Payer: Self-pay | Source: Home / Self Care | Attending: Urology

## 2020-07-30 DIAGNOSIS — E039 Hypothyroidism, unspecified: Secondary | ICD-10-CM | POA: Diagnosis present

## 2020-07-30 DIAGNOSIS — E785 Hyperlipidemia, unspecified: Secondary | ICD-10-CM | POA: Diagnosis present

## 2020-07-30 DIAGNOSIS — N318 Other neuromuscular dysfunction of bladder: Secondary | ICD-10-CM | POA: Diagnosis not present

## 2020-07-30 DIAGNOSIS — K219 Gastro-esophageal reflux disease without esophagitis: Secondary | ICD-10-CM | POA: Diagnosis present

## 2020-07-30 DIAGNOSIS — Z79899 Other long term (current) drug therapy: Secondary | ICD-10-CM | POA: Diagnosis not present

## 2020-07-30 DIAGNOSIS — I10 Essential (primary) hypertension: Secondary | ICD-10-CM | POA: Diagnosis not present

## 2020-07-30 DIAGNOSIS — N8111 Cystocele, midline: Secondary | ICD-10-CM | POA: Diagnosis not present

## 2020-07-30 DIAGNOSIS — N819 Female genital prolapse, unspecified: Secondary | ICD-10-CM | POA: Diagnosis not present

## 2020-07-30 DIAGNOSIS — N736 Female pelvic peritoneal adhesions (postinfective): Secondary | ICD-10-CM | POA: Diagnosis present

## 2020-07-30 DIAGNOSIS — N800 Endometriosis of uterus: Secondary | ICD-10-CM | POA: Diagnosis not present

## 2020-07-30 DIAGNOSIS — L233 Allergic contact dermatitis due to drugs in contact with skin: Secondary | ICD-10-CM | POA: Diagnosis not present

## 2020-07-30 DIAGNOSIS — N183 Chronic kidney disease, stage 3 unspecified: Secondary | ICD-10-CM | POA: Diagnosis not present

## 2020-07-30 DIAGNOSIS — Z79890 Hormone replacement therapy (postmenopausal): Secondary | ICD-10-CM

## 2020-07-30 DIAGNOSIS — T413X5A Adverse effect of local anesthetics, initial encounter: Secondary | ICD-10-CM | POA: Diagnosis present

## 2020-07-30 DIAGNOSIS — N3946 Mixed incontinence: Secondary | ICD-10-CM | POA: Diagnosis not present

## 2020-07-30 DIAGNOSIS — R001 Bradycardia, unspecified: Secondary | ICD-10-CM | POA: Diagnosis not present

## 2020-07-30 DIAGNOSIS — J449 Chronic obstructive pulmonary disease, unspecified: Secondary | ICD-10-CM | POA: Diagnosis present

## 2020-07-30 DIAGNOSIS — N814 Uterovaginal prolapse, unspecified: Secondary | ICD-10-CM | POA: Diagnosis present

## 2020-07-30 DIAGNOSIS — Z20822 Contact with and (suspected) exposure to covid-19: Secondary | ICD-10-CM | POA: Diagnosis present

## 2020-07-30 DIAGNOSIS — N858 Other specified noninflammatory disorders of uterus: Secondary | ICD-10-CM | POA: Diagnosis not present

## 2020-07-30 DIAGNOSIS — I129 Hypertensive chronic kidney disease with stage 1 through stage 4 chronic kidney disease, or unspecified chronic kidney disease: Secondary | ICD-10-CM | POA: Diagnosis not present

## 2020-07-30 HISTORY — PX: ROBOTIC ASSISTED LAPAROSCOPIC SACROCOLPOPEXY: SHX5388

## 2020-07-30 LAB — TYPE AND SCREEN
ABO/RH(D): O POS
Antibody Screen: NEGATIVE

## 2020-07-30 LAB — HEMOGLOBIN AND HEMATOCRIT, BLOOD
HCT: 37 % (ref 36.0–46.0)
Hemoglobin: 11.9 g/dL — ABNORMAL LOW (ref 12.0–15.0)

## 2020-07-30 SURGERY — SACROCOLPOPEXY, ROBOT-ASSISTED, LAPAROSCOPIC
Anesthesia: General | Laterality: Bilateral

## 2020-07-30 MED ORDER — HYDROMORPHONE HCL 1 MG/ML IJ SOLN
0.5000 mg | INTRAMUSCULAR | Status: DC | PRN
  Administered 2020-07-30: 0.5 mg via INTRAVENOUS
  Filled 2020-07-30: qty 1

## 2020-07-30 MED ORDER — ACETAMINOPHEN 10 MG/ML IV SOLN
1000.0000 mg | Freq: Four times a day (QID) | INTRAVENOUS | Status: AC
  Administered 2020-07-30 – 2020-07-31 (×4): 1000 mg via INTRAVENOUS
  Filled 2020-07-30 (×4): qty 100

## 2020-07-30 MED ORDER — FENTANYL CITRATE (PF) 250 MCG/5ML IJ SOLN
INTRAMUSCULAR | Status: DC | PRN
  Administered 2020-07-30 (×2): 50 ug via INTRAVENOUS
  Administered 2020-07-30: 100 ug via INTRAVENOUS
  Administered 2020-07-30: 50 ug via INTRAVENOUS

## 2020-07-30 MED ORDER — ESTRADIOL 0.1 MG/GM VA CREA
TOPICAL_CREAM | VAGINAL | Status: DC | PRN
  Administered 2020-07-30: 1 via VAGINAL

## 2020-07-30 MED ORDER — PROPOFOL 10 MG/ML IV BOLUS
INTRAVENOUS | Status: DC | PRN
  Administered 2020-07-30: 140 mg via INTRAVENOUS

## 2020-07-30 MED ORDER — ORAL CARE MOUTH RINSE
15.0000 mL | Freq: Once | OROMUCOSAL | Status: AC
  Administered 2020-07-30: 15 mL via OROMUCOSAL

## 2020-07-30 MED ORDER — EPHEDRINE 5 MG/ML INJ
INTRAVENOUS | Status: AC
  Filled 2020-07-30: qty 10

## 2020-07-30 MED ORDER — DEXAMETHASONE SODIUM PHOSPHATE 10 MG/ML IJ SOLN
INTRAMUSCULAR | Status: DC | PRN
  Administered 2020-07-30: 10 mg via INTRAVENOUS

## 2020-07-30 MED ORDER — BELLADONNA ALKALOIDS-OPIUM 16.2-60 MG RE SUPP
1.0000 | Freq: Four times a day (QID) | RECTAL | Status: DC | PRN
  Administered 2020-07-30: 1 via RECTAL

## 2020-07-30 MED ORDER — DOCUSATE SODIUM 100 MG PO CAPS: 100.0000 mg | ORAL_CAPSULE | Freq: Two times a day (BID) | ORAL | Status: DC

## 2020-07-30 MED ORDER — ACETAMINOPHEN 500 MG PO TABS
ORAL_TABLET | ORAL | Status: AC
  Administered 2020-07-30: 1000 mg via ORAL
  Filled 2020-07-30: qty 2

## 2020-07-30 MED ORDER — DEXTROSE-NACL 5-0.45 % IV SOLN
INTRAVENOUS | Status: DC
Start: 2020-07-30 — End: 2020-07-31

## 2020-07-30 MED ORDER — ONDANSETRON HCL 4 MG/2ML IJ SOLN: 4.0000 mg | INTRAMUSCULAR | Status: DC | PRN

## 2020-07-30 MED ORDER — SULFAMETHOXAZOLE-TRIMETHOPRIM 800-160 MG PO TABS
1.0000 | ORAL_TABLET | Freq: Two times a day (BID) | ORAL | Status: DC
  Administered 2020-07-30 – 2020-07-31 (×3): 1 via ORAL
  Filled 2020-07-30 (×3): qty 1

## 2020-07-30 MED ORDER — DEXAMETHASONE SODIUM PHOSPHATE 10 MG/ML IJ SOLN
INTRAMUSCULAR | Status: AC
  Filled 2020-07-30: qty 1

## 2020-07-30 MED ORDER — FENTANYL CITRATE (PF) 100 MCG/2ML IJ SOLN
INTRAMUSCULAR | Status: AC
  Administered 2020-07-30: 25 ug via INTRAVENOUS
  Filled 2020-07-30: qty 2

## 2020-07-30 MED ORDER — LEVOTHYROXINE SODIUM 50 MCG PO TABS
75.0000 ug | ORAL_TABLET | Freq: Every day | ORAL | Status: DC
  Administered 2020-07-31 – 2020-08-01 (×2): 75 ug via ORAL
  Filled 2020-07-30 (×2): qty 1

## 2020-07-30 MED ORDER — MIDAZOLAM HCL 2 MG/2ML IJ SOLN
INTRAMUSCULAR | Status: AC
  Filled 2020-07-30: qty 2

## 2020-07-30 MED ORDER — BUPIVACAINE-EPINEPHRINE (PF) 0.5% -1:200000 IJ SOLN
INTRAMUSCULAR | Status: AC
  Filled 2020-07-30: qty 30

## 2020-07-30 MED ORDER — DIPHENHYDRAMINE HCL 12.5 MG/5ML PO ELIX
12.5000 mg | ORAL_SOLUTION | Freq: Four times a day (QID) | ORAL | Status: DC | PRN
  Administered 2020-07-31: 12.5 mg via ORAL
  Filled 2020-07-30: qty 5

## 2020-07-30 MED ORDER — DIPHENHYDRAMINE HCL 50 MG/ML IJ SOLN: 12.5000 mg | Freq: Four times a day (QID) | INTRAMUSCULAR | Status: DC | PRN

## 2020-07-30 MED ORDER — CEFAZOLIN SODIUM-DEXTROSE 2-3 GM-%(50ML) IV SOLR
INTRAVENOUS | Status: DC | PRN
  Administered 2020-07-30: 2 g via INTRAVENOUS

## 2020-07-30 MED ORDER — EPHEDRINE SULFATE-NACL 50-0.9 MG/10ML-% IV SOSY
PREFILLED_SYRINGE | INTRAVENOUS | Status: DC | PRN
  Administered 2020-07-30: 10 mg via INTRAVENOUS

## 2020-07-30 MED ORDER — AMISULPRIDE (ANTIEMETIC) 5 MG/2ML IV SOLN: 10.0000 mg | Freq: Once | INTRAVENOUS | Status: DC | PRN

## 2020-07-30 MED ORDER — PROPOFOL 1000 MG/100ML IV EMUL
INTRAVENOUS | Status: AC
  Filled 2020-07-30: qty 100

## 2020-07-30 MED ORDER — SUGAMMADEX SODIUM 200 MG/2ML IV SOLN
INTRAVENOUS | Status: DC | PRN
  Administered 2020-07-30: 200 mg via INTRAVENOUS

## 2020-07-30 MED ORDER — BUPIVACAINE LIPOSOME 1.3 % IJ SUSP
20.0000 mL | Freq: Once | INTRAMUSCULAR | Status: AC
  Administered 2020-07-30: 20 mL
  Filled 2020-07-30: qty 20

## 2020-07-30 MED ORDER — ACETAMINOPHEN 325 MG PO TABS: 650.0000 mg | ORAL_TABLET | ORAL | Status: DC | PRN

## 2020-07-30 MED ORDER — ONDANSETRON HCL 4 MG/2ML IJ SOLN
INTRAMUSCULAR | Status: DC | PRN
  Administered 2020-07-30: 4 mg via INTRAVENOUS

## 2020-07-30 MED ORDER — TRAMADOL HCL 50 MG PO TABS: 50.0000 mg | ORAL_TABLET | Freq: Four times a day (QID) | ORAL | 0 refills | Status: DC | PRN

## 2020-07-30 MED ORDER — CEFAZOLIN SODIUM-DEXTROSE 2-4 GM/100ML-% IV SOLN
INTRAVENOUS | Status: AC
  Filled 2020-07-30: qty 100

## 2020-07-30 MED ORDER — ESTRADIOL 0.1 MG/GM VA CREA
TOPICAL_CREAM | VAGINAL | Status: AC
  Filled 2020-07-30: qty 42.5

## 2020-07-30 MED ORDER — DOCUSATE SODIUM 100 MG PO CAPS
100.0000 mg | ORAL_CAPSULE | Freq: Two times a day (BID) | ORAL | Status: DC
Start: 2020-07-30 — End: 2020-08-01
  Administered 2020-07-30 – 2020-07-31 (×4): 100 mg via ORAL
  Filled 2020-07-30 (×4): qty 1

## 2020-07-30 MED ORDER — SULFAMETHOXAZOLE-TRIMETHOPRIM 800-160 MG PO TABS: 1.0000 | ORAL_TABLET | Freq: Two times a day (BID) | ORAL | 0 refills | Status: DC

## 2020-07-30 MED ORDER — PROPOFOL 500 MG/50ML IV EMUL
INTRAVENOUS | Status: AC
  Filled 2020-07-30: qty 50

## 2020-07-30 MED ORDER — CHLORHEXIDINE GLUCONATE 0.12 % MT SOLN: 15.0000 mL | Freq: Once | OROMUCOSAL | Status: AC

## 2020-07-30 MED ORDER — ACETAMINOPHEN 500 MG PO TABS: 1000.0000 mg | ORAL_TABLET | Freq: Once | ORAL | Status: AC

## 2020-07-30 MED ORDER — ACETAMINOPHEN 10 MG/ML IV SOLN
INTRAVENOUS | Status: AC
  Filled 2020-07-30: qty 100

## 2020-07-30 MED ORDER — ONDANSETRON HCL 4 MG/2ML IJ SOLN
INTRAMUSCULAR | Status: AC
  Filled 2020-07-30: qty 2

## 2020-07-30 MED ORDER — ROCURONIUM BROMIDE 10 MG/ML (PF) SYRINGE
PREFILLED_SYRINGE | INTRAVENOUS | Status: DC | PRN
  Administered 2020-07-30: 20 mg via INTRAVENOUS
  Administered 2020-07-30: 50 mg via INTRAVENOUS
  Administered 2020-07-30: 20 mg via INTRAVENOUS

## 2020-07-30 MED ORDER — PROPOFOL 500 MG/50ML IV EMUL
INTRAVENOUS | Status: DC | PRN
  Administered 2020-07-30: 100 ug/kg/min via INTRAVENOUS

## 2020-07-30 MED ORDER — LIDOCAINE 2% (20 MG/ML) 5 ML SYRINGE
INTRAMUSCULAR | Status: DC | PRN
  Administered 2020-07-30: 100 mg via INTRAVENOUS

## 2020-07-30 MED ORDER — CHLORHEXIDINE GLUCONATE CLOTH 2 % EX PADS
6.0000 | MEDICATED_PAD | Freq: Every day | CUTANEOUS | Status: DC
  Administered 2020-07-30 – 2020-07-31 (×2): 6 via TOPICAL

## 2020-07-30 MED ORDER — MIDAZOLAM HCL 5 MG/5ML IJ SOLN
INTRAMUSCULAR | Status: DC | PRN
  Administered 2020-07-30: 2 mg via INTRAVENOUS

## 2020-07-30 MED ORDER — ACETAMINOPHEN 10 MG/ML IV SOLN
1000.0000 mg | Freq: Once | INTRAVENOUS | Status: DC | PRN
  Administered 2020-07-30: 1000 mg via INTRAVENOUS

## 2020-07-30 MED ORDER — TRAMADOL HCL 50 MG PO TABS
50.0000 mg | ORAL_TABLET | Freq: Four times a day (QID) | ORAL | Status: DC | PRN
  Administered 2020-07-31: 50 mg via ORAL
  Administered 2020-08-01: 100 mg via ORAL
  Filled 2020-07-30: qty 1
  Filled 2020-07-30: qty 2

## 2020-07-30 MED ORDER — FENTANYL CITRATE (PF) 250 MCG/5ML IJ SOLN
INTRAMUSCULAR | Status: AC
  Filled 2020-07-30: qty 5

## 2020-07-30 MED ORDER — LACTATED RINGERS IR SOLN
Status: DC | PRN
  Administered 2020-07-30: 1000 mL

## 2020-07-30 MED ORDER — LOSARTAN POTASSIUM 50 MG PO TABS
100.0000 mg | ORAL_TABLET | Freq: Every day | ORAL | Status: DC
  Administered 2020-07-30 – 2020-07-31 (×2): 100 mg via ORAL
  Filled 2020-07-30 (×2): qty 2

## 2020-07-30 MED ORDER — BUPIVACAINE-EPINEPHRINE 0.25% -1:200000 IJ SOLN
INTRAMUSCULAR | Status: AC
  Filled 2020-07-30: qty 1

## 2020-07-30 MED ORDER — KETOROLAC TROMETHAMINE 15 MG/ML IJ SOLN
15.0000 mg | Freq: Four times a day (QID) | INTRAMUSCULAR | Status: AC
  Administered 2020-07-30 – 2020-07-31 (×4): 15 mg via INTRAVENOUS
  Filled 2020-07-30 (×5): qty 1

## 2020-07-30 MED ORDER — BELLADONNA ALKALOIDS-OPIUM 16.2-60 MG RE SUPP
RECTAL | Status: AC
  Filled 2020-07-30: qty 1

## 2020-07-30 MED ORDER — STERILE WATER FOR IRRIGATION IR SOLN
Status: DC | PRN
  Administered 2020-07-30: 1000 mL

## 2020-07-30 MED ORDER — BUPIVACAINE-EPINEPHRINE (PF) 0.5% -1:200000 IJ SOLN
INTRAMUSCULAR | Status: DC | PRN
  Administered 2020-07-30: 30 mL

## 2020-07-30 MED ORDER — FENTANYL CITRATE (PF) 100 MCG/2ML IJ SOLN
25.0000 ug | INTRAMUSCULAR | Status: DC | PRN
  Administered 2020-07-30 (×2): 50 ug via INTRAVENOUS

## 2020-07-30 MED ORDER — ROCURONIUM BROMIDE 10 MG/ML (PF) SYRINGE
PREFILLED_SYRINGE | INTRAVENOUS | Status: AC
  Filled 2020-07-30: qty 10

## 2020-07-30 MED ORDER — LACTATED RINGERS IV SOLN: INTRAVENOUS | Status: DC

## 2020-07-30 MED ORDER — PROPOFOL 10 MG/ML IV BOLUS
INTRAVENOUS | Status: AC
  Filled 2020-07-30: qty 20

## 2020-07-30 MED ORDER — LIDOCAINE 2% (20 MG/ML) 5 ML SYRINGE
INTRAMUSCULAR | Status: AC
  Filled 2020-07-30: qty 5

## 2020-07-30 SURGICAL SUPPLY — 68 items
BAG URINE DRAIN 2000ML AR STRL (UROLOGICAL SUPPLIES) ×2 IMPLANT
CATH FOLEY 2WAY SLVR  5CC 16FR (CATHETERS)
CATH FOLEY 2WAY SLVR 5CC 16FR (CATHETERS) IMPLANT
CATH SILICONE 16FRX5CC (CATHETERS) ×2 IMPLANT
CHLORAPREP W/TINT 26 (MISCELLANEOUS) ×2 IMPLANT
CLIP VESOLOCK LG 6/CT PURPLE (CLIP) ×2 IMPLANT
CLIP VESOLOCK MED LG 6/CT (CLIP) ×2 IMPLANT
CLIP VESOLOCK XL 6/CT (CLIP) IMPLANT
COVER SURGICAL LIGHT HANDLE (MISCELLANEOUS) ×2 IMPLANT
COVER TIP SHEARS 8 DVNC (MISCELLANEOUS) ×1 IMPLANT
COVER TIP SHEARS 8MM DA VINCI (MISCELLANEOUS) ×1
COVER WAND RF STERILE (DRAPES) IMPLANT
DERMABOND ADVANCED (GAUZE/BANDAGES/DRESSINGS) ×1
DERMABOND ADVANCED .7 DNX12 (GAUZE/BANDAGES/DRESSINGS) ×1 IMPLANT
DRAIN CHANNEL RND F F (WOUND CARE) IMPLANT
DRAPE ARM DVNC X/XI (DISPOSABLE) ×4 IMPLANT
DRAPE COLUMN DVNC XI (DISPOSABLE) ×1 IMPLANT
DRAPE DA VINCI XI ARM (DISPOSABLE) ×4
DRAPE DA VINCI XI COLUMN (DISPOSABLE) ×1
DRAPE INCISE 23X17 IOBAN STRL (DRAPES) ×1
DRAPE INCISE IOBAN 23X17 STRL (DRAPES) ×1 IMPLANT
DRAPE INCISE IOBAN 66X45 STRL (DRAPES) IMPLANT
DRAPE SHEET LG 3/4 BI-LAMINATE (DRAPES) ×6 IMPLANT
DRAPE SURG IRRIG POUCH 19X23 (DRAPES) ×2 IMPLANT
ELECT PENCIL ROCKER SW 15FT (MISCELLANEOUS) ×2 IMPLANT
ELECT REM PT RETURN 15FT ADLT (MISCELLANEOUS) ×2 IMPLANT
GAUZE 4X4 16PLY RFD (DISPOSABLE) IMPLANT
GLOVE SURG ENC MOIS LTX SZ6.5 (GLOVE) IMPLANT
GLOVE SURG ENC TEXT LTX SZ7.5 (GLOVE) IMPLANT
GOWN STRL REUS W/TWL LRG LVL3 (GOWN DISPOSABLE) ×2 IMPLANT
GOWN STRL REUS W/TWL XL LVL3 (GOWN DISPOSABLE) ×4 IMPLANT
HOLDER FOLEY CATH W/STRAP (MISCELLANEOUS) ×2 IMPLANT
IRRIG SUCT STRYKERFLOW 2 WTIP (MISCELLANEOUS) ×2
IRRIGATION SUCT STRKRFLW 2 WTP (MISCELLANEOUS) ×1 IMPLANT
KIT BASIN OR (CUSTOM PROCEDURE TRAY) ×2 IMPLANT
KIT TURNOVER KIT A (KITS) ×2 IMPLANT
MANIPULATOR UTERINE 4.5 ZUMI (MISCELLANEOUS) ×2 IMPLANT
MARKER SKIN DUAL TIP RULER LAB (MISCELLANEOUS) ×2 IMPLANT
MESH Y UPSYLON VAGINAL (Mesh General) ×2 IMPLANT
OCCLUDER COLPOPNEUMO (BALLOONS) IMPLANT
PACKING VAGINAL (PACKING) ×2 IMPLANT
PAD POSITIONING PINK XL (MISCELLANEOUS) ×2 IMPLANT
POUCH SPECIMEN RETRIEVAL 10MM (ENDOMECHANICALS) IMPLANT
SCISSORS LAP 5X45 EPIX DISP (ENDOMECHANICALS) ×2 IMPLANT
SEAL CANN UNIV 5-8 DVNC XI (MISCELLANEOUS) ×4 IMPLANT
SEAL XI 5MM-8MM UNIVERSAL (MISCELLANEOUS) ×4
SET IRRIG Y TYPE TUR BLADDER L (SET/KITS/TRAYS/PACK) IMPLANT
SET TUBE SMOKE EVAC HIGH FLOW (TUBING) ×2 IMPLANT
SHEET LAVH (DRAPES) ×2 IMPLANT
SOLUTION ELECTROLUBE (MISCELLANEOUS) ×2 IMPLANT
SUT MNCRL AB 4-0 PS2 18 (SUTURE) ×4 IMPLANT
SUT PROLENE 2 0 CT 1 (SUTURE) ×4 IMPLANT
SUT VIC AB 0 CT1 27 (SUTURE) ×1
SUT VIC AB 0 CT1 27XBRD ANTBC (SUTURE) ×1 IMPLANT
SUT VIC AB 2-0 SH 27 (SUTURE) ×5
SUT VIC AB 2-0 SH 27XBRD (SUTURE) ×5 IMPLANT
SUT VIC AB 3-0 SH 27 (SUTURE) ×2
SUT VIC AB 3-0 SH 27X BRD (SUTURE) ×2 IMPLANT
SUT VICRYL 0 UR6 27IN ABS (SUTURE) ×4 IMPLANT
SYR 10ML LL (SYRINGE) ×4 IMPLANT
SYR 50ML LL SCALE MARK (SYRINGE) IMPLANT
SYR BULB IRRIG 60ML STRL (SYRINGE) IMPLANT
TOWEL OR 17X26 10 PK STRL BLUE (TOWEL DISPOSABLE) ×2 IMPLANT
TRAY LAPAROSCOPIC (CUSTOM PROCEDURE TRAY) ×2 IMPLANT
TROCAR ENDOPATH XCEL 12X100 BL (ENDOMECHANICALS) IMPLANT
TROCAR XCEL 12X100 BLDLESS (ENDOMECHANICALS) ×2 IMPLANT
TROCAR XCEL NON-BLD 5MMX100MML (ENDOMECHANICALS) IMPLANT
WATER STERILE IRR 1000ML POUR (IV SOLUTION) ×2 IMPLANT

## 2020-07-30 NOTE — Interval H&P Note (Signed)
History and Physical Interval Note:  07/30/2020 7:33 AM  Julia Deleon  has presented today for surgery, with the diagnosis of PELVIC ORGAN PROLAPSE.  The various methods of treatment have been discussed with the patient and family. After consideration of risks, benefits and other options for treatment, the patient has consented to  Procedure(s) with comments: XI ROBOTIC ASSISTED LAPAROSCOPIC SACROCOLPOPEXY AND SUPRACERVICAL HYSTERECTOMY WITH BILATERAL SALPINGO OOPHERECTOMY (Bilateral) - REQUESTING 4 HRS as a surgical intervention.  The patient's history has been reviewed, patient examined, no change in status, stable for surgery.  I have reviewed the patient's chart and labs.  Questions were answered to the patient's satisfaction.     Ardis Hughs

## 2020-07-30 NOTE — Anesthesia Postprocedure Evaluation (Signed)
Anesthesia Post Note  Patient: Julia Deleon  Procedure(s) Performed: XI ROBOTIC ASSISTED LAPAROSCOPIC SACROCOLPOPEXY AND SUPRACERVICAL HYSTERECTOMY WITH BILATERAL SALPINGO OOPHERECTOMY (Bilateral )     Patient location during evaluation: PACU Anesthesia Type: General Level of consciousness: awake and alert and oriented Pain management: pain level controlled Vital Signs Assessment: post-procedure vital signs reviewed and stable Respiratory status: spontaneous breathing, nonlabored ventilation and respiratory function stable Cardiovascular status: blood pressure returned to baseline Postop Assessment: no apparent nausea or vomiting Anesthetic complications: no   No complications documented.  Last Vitals:  Vitals:   07/30/20 1315 07/30/20 1330  BP: (!) 142/57 (!) 144/63  Pulse: 60 61  Resp: (!) 9 15  Temp:    SpO2: 100% 97%    Last Pain:  Vitals:   07/30/20 1315  TempSrc:   PainSc: [place]

## 2020-07-30 NOTE — Transfer of Care (Signed)
Immediate Anesthesia Transfer of Care Note  Patient: Julia Deleon  Procedure(s) Performed: XI ROBOTIC ASSISTED LAPAROSCOPIC SACROCOLPOPEXY AND SUPRACERVICAL HYSTERECTOMY WITH BILATERAL SALPINGO OOPHERECTOMY (Bilateral )  Patient Location: PACU  Anesthesia Type:General  Level of Consciousness: sedated  Airway & Oxygen Therapy: Patient Spontanous Breathing and Patient connected to face mask oxygen  Post-op Assessment: Report given to RN and Post -op Vital signs reviewed and stable  Post vital signs: Reviewed and stable  Last Vitals:  Vitals Value Taken Time  BP    Temp    Pulse 67 07/30/20 1124  Resp 13 07/30/20 1124  SpO2 100 % 07/30/20 1124  Vitals shown include unvalidated device data.  Last Pain:  Vitals:   07/30/20 0612  TempSrc: Oral         Complications: No complications documented.

## 2020-07-30 NOTE — Discharge Instructions (Signed)

## 2020-07-30 NOTE — Plan of Care (Signed)
  Problem: Activity: Goal: Risk for activity intolerance will decrease Outcome: Progressing   Problem: Nutrition: Goal: Adequate nutrition will be maintained Outcome: Progressing   Problem: Education: Goal: Knowledge of General Education information will improve Description: Including pain rating scale, medication(s)/side effects and non-pharmacologic comfort measures Outcome: Completed/Met   

## 2020-07-30 NOTE — Op Note (Signed)
Preoperative diagnosis:  1. Pelvic organ prolapse  Postoperative diagnosis:  1. Same  Procedure: 1. Robotic-assisted laparoscopic supracervical hysterectomy and salpingo-ectomy 2. Robotic-assisted laparoscopic sacrocolpopexy  Surgeon: Ardis Hughs, MD First assistant: Debbrah Alar, PA-C  Anesthesia: General  Complications: None  Intraoperative findings:  [place] Y mesh used for the sacrocolpopexy.  EBL: 50 mL  Specimens: Supracervical hysterectomy and salpingo-ectomy  Indication: Julia Deleon is a 75 y.o. female patient with symptomatic pelvic organ prolapse.   After reviewing the management options for treatment, she elected to proceed with the above surgical procedure(s). We have discussed the potential benefits and risks of the procedure, side effects of the proposed treatment, the likelihood of the patient achieving the goals of the procedure, and any potential problems that might occur during the procedure or recuperation. Informed consent has been obtained.  Description of procedure:  The patient was taken to the operating room and general anesthesia was induced. The patient was placed in the dorsal lithotomy position, prepped and draped in the usual sterile fashion, and preoperative antibiotics were administered. A preoperative time-out was performed.   A Foley catheter was then placed and placed to gravity drainage. I then made a periumbilical incision carrying the dissection down to the patient's fascia with electrocautery. Once to the fascia, the fascia was incised and a small puncture hole made in the peritoneum to allow passage of a 59mm port.  The abdomen was insufflated and the remaining ports placed under digital guidance. 2 ports were placed lateral to the umbilicus on the right proximally 10 cm apart. The most lateral port was approximately 3 cm above the anterior iliac spine. 2 additional ports were placed in the patient's right side in  comparable positions to the most lateral port on the right was a 12 mm port.the robot was then docked at an angle from the leg obliquely along the side of the left leg.  We then began our surgery by cleaning up some of the pelvic adhesions to the small bowel and colon. Once this was completed I started dissecting at the sacral promontory located 3 cm medial to the location where the ureter crosses over the iliac vessels at the pelvic brim. The posterior peritoneum was incised and the sacral prominence cleared off an area taking care to avoid the middle sacral vessels and the iliac branches. I then created a posterior peritoneal tunnel starting at the sacral promontory and tunneling down the right pelvic sidewall down into the pelvis breaking back through the posterior peritoneum around the vesico-vaginal junction posteriorly. I then continued the posterior dissection retracting down on the rectum and finding the avascular plane between the posterior vaginal wall and the rectum. I carried this dissection down as far as I could to along the area of the perineal body.  Then focused my attention to the uterus and hysterectomy. I first started by taking the right round ligament with a series of by polar cautery. I then dissected the anterior leaf of the broad ligament slightly more proximal and then distally down across the anterior mucosa salpinx and the internal cervical os. I then took of the uterine ovarian ligament on the right and dissected free the right salpinx. Once the anterior leaf of the broad ligament had been completely dissected on the patient's right and a small bladder flap had been created anteriorly attention was turned to the left side where a similar dissection was carried out. I then turned my attention to the anterior plane between the  anterior vaginal wall and the bladder. I was able to obtain access to the avascular plane and with a combination of both monopolar cautery and blunt  dissection was able to clean and nice down to the bladder neck. I then turned my attention back to the patient's uterus and skeletonized the right uterine artery and vein and then took this with a series of bipolar moves. I then performed a similar uterus pedicle ligation on the left. This point I was able to identify the patient's cervix and came through supracervical with monopolar cautery once the uterus was freed from all its attachments it was pushed into the left paracolic gutter and our attention was turned to placing the wire mesh.  Mesh was measured at approximately 7 cm anteriorly and 7 cm posteriorly and I cut this on the back table. The mesh was then placed into the patient's abdomen through the assistant port and the anterior leaf was secured down onto the anterior vaginal wall with the apex at the bladder neck. The posterior leaf was then secured down on the posterior vaginal wall. These were sewn down with 2-0 Vicryl. Between 6 and 8 were done on each side. At this point I then went back to the previously dissected sacral promontory and posterior peritoneal tunnel and inserted a instrument through the tunnel and grasped the end of the mesh at the vaginal cuff and pull it up to the sacrum. I then checked to ensure that the sacral mesh was not too tight by performing a vaginal exam. I then secured the sacral leg of the mesh using a 0 Prolene. I then reapproximated the posterior peritoneum with a 2-0 Vicryl in a running fashion around the sacral promontory. The pelvic peritoneum was closed using a pursestring. A small Endo Catch bag was then gently passed through the assistant port and the uterus was placed in the bag. The bag was then brought out through the camera port once the trochars were removed. We then made a slightly larger extraction incision to remove the uterus. The fascia was then closed with 0 Vicryl in a figure-of-eight fashion. The skin was closed with 4-0 Monocryl's.  Dermabond was applied to the incision and exparel injected into the incisions.  The patient was subsequently extubated and returned to the PACU in excellent condition.

## 2020-07-30 NOTE — Anesthesia Procedure Notes (Signed)
Procedure Name: Intubation Date/Time: 07/30/2020 7:46 AM Performed by: Talbot Grumbling, CRNA Pre-anesthesia Checklist: Patient identified, Emergency Drugs available, Suction available and Patient being monitored Patient Re-evaluated:Patient Re-evaluated prior to induction Oxygen Delivery Method: Circle system utilized Preoxygenation: Pre-oxygenation with 100% oxygen Induction Type: IV induction Ventilation: Mask ventilation without difficulty Laryngoscope Size: Mac and 3 Grade View: Grade I Tube type: Oral Tube size: 7.5 mm Number of attempts: 1 Airway Equipment and Method: Stylet Placement Confirmation: ETT inserted through vocal cords under direct vision,  positive ETCO2 and breath sounds checked- equal and bilateral Secured at: 21 cm Tube secured with: Tape Dental Injury: Teeth and Oropharynx as per pre-operative assessment

## 2020-07-30 NOTE — H&P (Signed)
75 year old female who presents today for discussion pelvic organ prolapse. She has been seen and evaluated by Dr. Matilde Sprang. She is also seen Dr. cousins from GYN.   The patient has a history of neurogenic bladder and for the past 2 years has been performing clean intermittent catheterization. She has been on suppression antibiotics as well. She is finally now able to void on her own. However, she does have significant symptomatic pelvic organ prolapse. She was noted by Dr. Matilde Sprang to have a large anterior prolapse. She maintains her uterus. She has fortunately no significant bowel dysfunction. However, she does have incomplete bladder emptying and intermittent incontinence.   The patient had urodynamics which demonstrated a large capacity bladder with elevated PVRs. She had mild stress urinary incontinence with fairly high urethral pressures and a large bladder capacity.   The patient was counseled by Dr. Matilde Sprang that robotic sacral colpopexy in hysterectomy we would likely be her best option for a robust repair. In addition, he recommended against her having a mid urethral sling concomitantly.   The patient is ready to have the surgery.   The patient has a history of laminectomy approximately 2 years ago. She also has had a laparoscopic cholecystectomy in an open appendectomy. She also has a pending left knee replacement.   The patient is on topical estrogen for her prolapse and vaginal health. She is not a diabetic and has no history of hypertension. She has fairly good exercise tolerance. She has had no anesthetic complications.     ALLERGIES: Amoxicillin-Pot Clavulanate TABS benazepril latex levofloxacin Methylisothiazolinone Simvastatin Thimerosal Welchol    MEDICATIONS: Acetaminophen 8 Hour 650 mg tablet, extended release  Allegra Allergy  Azelastine Hcl  Chlorzoxazone 500 mg tablet  Clotrimazole-Betamethasone 1 %-0.05 % cream  Co Q10  Cranberry  Cyanocobalamin  Dhea   D-Mannose  Epinephrine  Estradiol  Fexofenadine Hcl 60 mg tablet  Glucosamine-Chondroitin 500 mg-400 mg tablet  Levothyroxine 75 mcg capsule  Losartan Potassium 100 mg tablet  Lutein 20 mg tablet  Multivitamin  Nasacort  Probiotic  Progesterone  Turmeric  Ubiquinol 100 mg capsule  Vitamin D3     GU PSH: Complex cystometrogram, w/ void pressure and urethral pressure profile studies, any technique - 05/20/2020 Complex Uroflow - 05/20/2020 Emg surf Electrd - 05/20/2020 Inject For cystogram - 05/20/2020 Intrabd voidng Press - 05/20/2020     NON-GU PSH: No Non-GU PSH    GU PMH: Mixed incontinence - 05/20/2020 Urinary Urgency - 05/20/2020    NON-GU PMH: No Non-GU PMH    FAMILY HISTORY: No Family History    SOCIAL HISTORY: No Social History    REVIEW OF SYSTEMS:    GU Review Female:   Patient reports leakage of urine. Patient denies frequent urination, hard to postpone urination, burning /pain with urination, get up at night to urinate, stream starts and stops, trouble starting your stream, have to strain to urinate, and being pregnant.  Gastrointestinal (Upper):   Patient denies nausea, vomiting, and indigestion/ heartburn.  Gastrointestinal (Lower):   Patient denies diarrhea and constipation.  Constitutional:   Patient denies fever, night sweats, weight loss, and fatigue.  Skin:   Patient denies skin rash/ lesion and itching.  Eyes:   Patient denies blurred vision and double vision.  Ears/ Nose/ Throat:   Patient denies sore throat and sinus problems.  Hematologic/Lymphatic:   Patient denies easy bruising and swollen glands.  Cardiovascular:   Patient denies leg swelling and chest pains.  Respiratory:   Patient denies cough  and shortness of breath.  Endocrine:   Patient denies excessive thirst.  Musculoskeletal:   Patient denies back pain and joint pain.  Neurological:   Patient denies headaches and dizziness.  Psychologic:   Patient denies depression and anxiety.   VITAL  SIGNS:      06/06/2020 03:53 PM  Weight 160 lb / 72.57 kg  Temperature 97.3 F / 36.2 C   MULTI-SYSTEM PHYSICAL EXAMINATION:    Constitutional: Well-nourished. No physical deformities. Normally developed. Good grooming.   Respiratory: Normal breath sounds. No labored breathing, no use of accessory muscles.   Cardiovascular: Regular rate and rhythm. No murmur, no gallop. Normal temperature, normal extremity pulses, no swelling, no varicosities.      Complexity of Data:  Source Of History:  Patient, Healthcare Provider  Records Review:   Previous Doctor Records, Previous Patient Records  Urine Test Review:   Urinalysis, Urine Culture  Urodynamics Review:   Review Urodynamics Tests   PROCEDURES:          Urinalysis - 81003 Dipstick Dipstick Cont'd  Color: Yellow Bilirubin: Neg  Appearance: Clear Ketones: Neg  Specific Gravity: 1.025 Blood: Neg  pH: 6.0 Protein: Neg  Glucose: Neg Urobilinogen: 0.2    Nitrites: Neg    Leukocyte Esterase: Neg    Notes:      ASSESSMENT:      ICD-10 Details  1 GU:   Mixed incontinence - N39.46   2   Cystocele, midline - N81.11    PLAN:           Document Letter(s):  Created for Patient: Clinical Summary         Notes:   I went over robotic-assisted laparoscopic sacral colpopexy with the patient detail. I explained to the patient the rationale for the surgery. I also went over the placement of the laparoscopic ports. I detailed to her the surgery as well as the postoperative recovery time. I explained to the patient that she could expect to be in the hospital at least one or 2 nights. She will require 4 weeks of no heavy lifting, 6 weeks of no bending or twisting. She will not be able to use her vagina for 6 weeks. I discussed complications of the operation including injury to bowel, ureters, bladder. We also discussed the risk of failure as well as the complications of mesh. I explained to them the difference between transvaginal mesh and the mesh  used for sacral colpopexy. I reassured them that there has not been an FDA warnings in regards to sacral colpopexy mesh. We will plan to get this prior to her surgery. I spent 45 minutes with the patient going over that ins and outs of the surgery and answering all her questions.   I also explained to the patient that as part of this operation we would be doing a supracervical hysterectomy.   We will try to get this scheduled as soon as possible.

## 2020-07-31 ENCOUNTER — Encounter (HOSPITAL_COMMUNITY): Payer: Self-pay | Admitting: Urology

## 2020-07-31 DIAGNOSIS — N736 Female pelvic peritoneal adhesions (postinfective): Secondary | ICD-10-CM | POA: Diagnosis present

## 2020-07-31 DIAGNOSIS — N3946 Mixed incontinence: Secondary | ICD-10-CM | POA: Diagnosis present

## 2020-07-31 DIAGNOSIS — Z79899 Other long term (current) drug therapy: Secondary | ICD-10-CM | POA: Diagnosis not present

## 2020-07-31 DIAGNOSIS — N318 Other neuromuscular dysfunction of bladder: Secondary | ICD-10-CM | POA: Diagnosis present

## 2020-07-31 DIAGNOSIS — E785 Hyperlipidemia, unspecified: Secondary | ICD-10-CM | POA: Diagnosis present

## 2020-07-31 DIAGNOSIS — T413X5A Adverse effect of local anesthetics, initial encounter: Secondary | ICD-10-CM | POA: Diagnosis present

## 2020-07-31 DIAGNOSIS — J449 Chronic obstructive pulmonary disease, unspecified: Secondary | ICD-10-CM | POA: Diagnosis present

## 2020-07-31 DIAGNOSIS — Z79890 Hormone replacement therapy (postmenopausal): Secondary | ICD-10-CM | POA: Diagnosis not present

## 2020-07-31 DIAGNOSIS — K219 Gastro-esophageal reflux disease without esophagitis: Secondary | ICD-10-CM | POA: Diagnosis present

## 2020-07-31 DIAGNOSIS — L233 Allergic contact dermatitis due to drugs in contact with skin: Secondary | ICD-10-CM | POA: Diagnosis not present

## 2020-07-31 DIAGNOSIS — Z20822 Contact with and (suspected) exposure to covid-19: Secondary | ICD-10-CM | POA: Diagnosis present

## 2020-07-31 DIAGNOSIS — R001 Bradycardia, unspecified: Secondary | ICD-10-CM | POA: Diagnosis not present

## 2020-07-31 DIAGNOSIS — N8111 Cystocele, midline: Secondary | ICD-10-CM | POA: Diagnosis present

## 2020-07-31 DIAGNOSIS — E039 Hypothyroidism, unspecified: Secondary | ICD-10-CM | POA: Diagnosis present

## 2020-07-31 DIAGNOSIS — I10 Essential (primary) hypertension: Secondary | ICD-10-CM | POA: Diagnosis present

## 2020-07-31 LAB — BASIC METABOLIC PANEL
Anion gap: 7 (ref 5–15)
BUN: 12 mg/dL (ref 8–23)
CO2: 22 mmol/L (ref 22–32)
Calcium: 8.5 mg/dL — ABNORMAL LOW (ref 8.9–10.3)
Chloride: 104 mmol/L (ref 98–111)
Creatinine, Ser: 1.09 mg/dL — ABNORMAL HIGH (ref 0.44–1.00)
GFR, Estimated: 54 mL/min — ABNORMAL LOW (ref >60)
Glucose, Bld: 142 mg/dL — ABNORMAL HIGH (ref 70–99)
Potassium: 4.4 mmol/L (ref 3.5–5.1)
Sodium: 133 mmol/L — ABNORMAL LOW (ref 135–145)

## 2020-07-31 LAB — HEMOGLOBIN AND HEMATOCRIT, BLOOD
HCT: 33 % — ABNORMAL LOW (ref 36.0–46.0)
Hemoglobin: 10.9 g/dL — ABNORMAL LOW (ref 12.0–15.0)

## 2020-07-31 MED ORDER — BACITRACIN ZINC 500 UNIT/GM EX OINT
TOPICAL_OINTMENT | CUTANEOUS | Status: DC | PRN
  Filled 2020-07-31: qty 0.9

## 2020-07-31 NOTE — Plan of Care (Signed)
  Problem: Health Behavior/Discharge Planning: Goal: Ability to manage health-related needs will improve Outcome: Progressing   Problem: Clinical Measurements: Goal: Ability to maintain clinical measurements within normal limits will improve Outcome: Progressing Goal: Will remain free from infection Outcome: Progressing Goal: Diagnostic test results will improve Outcome: Progressing Goal: Respiratory complications will improve Outcome: Progressing Goal: Cardiovascular complication will be avoided Outcome: Progressing   Problem: Activity: Goal: Risk for activity intolerance will decrease Outcome: Progressing   Problem: Nutrition: Goal: Adequate nutrition will be maintained Outcome: Progressing   Problem: Coping: Goal: Level of anxiety will decrease Outcome: Progressing   Problem: Elimination: Goal: Will not experience complications related to bowel motility Outcome: Progressing Goal: Will not experience complications related to urinary retention Outcome: Progressing   Problem: Pain Managment: Goal: General experience of comfort will improve Outcome: Progressing   Problem: Safety: Goal: Ability to remain free from injury will improve Outcome: Progressing   Problem: Skin Integrity: Goal: Risk for impaired skin integrity will decrease Outcome: Progressing   Problem: Education: Goal: Required Educational Video(s) Outcome: Progressing   Problem: Clinical Measurements: Goal: Postoperative complications will be avoided or minimized Outcome: Progressing   Problem: Skin Integrity: Goal: Demonstration of wound healing without infection will improve Outcome: Progressing   

## 2020-07-31 NOTE — Progress Notes (Addendum)
Pain meds due, asked patient if she needed it and she declined. Pulled foley around 1100 and she has not yet urinated. Patient walked two laps and did very well.   1515: Patient walked two laps with the tech and did very well. Patient was able to void small amount, hat was not in toilet. Will continue to monitor.

## 2020-07-31 NOTE — Progress Notes (Signed)
1 Day Post-Op Subjective: Doing well. Incisional pain controlled. Reports some vaginal discomfort. Tolerating CLD without nausea/emesis. Ambulated several times yesterday. No flatus.   Objective: Vital signs in last 24 hours: Temp:  [96.6 F (35.9 C)-98.3 F (36.8 C)] 98.3 F (36.8 C) (03/24 4158) Pulse Rate:  [52-67] 58 (03/24 0614) Resp:  [9-20] 20 (03/24 0614) BP: (107-151)/(51-73) 107/53 (03/24 0614) SpO2:  [94 %-100 %] 96 % (03/24 0614) Weight:  [72.6 kg] 72.6 kg (03/23 1419)  Intake/Output from previous day: 03/23 0701 - 03/24 0700 In: 2698 [P.O.:580; I.V.:1768; IV Piggyback:350] Out: 1900 [Urine:1850; Blood:50] Intake/Output this shift: No intake/output data recorded.  Physical Exam:  General: Alert and oriented, laying in bed in NAD CV: Mild bradycardia Lungs: NWOB on RA Abdomen: Soft, appropriately tender, slightly distended. Incisions closed with moderate surround ecchymosis; covered with dermabond; no signs of infection GU: Foley catheter in place draining yellow urine. Vaginal packing removed Ext: Warm and well-perfused  Lab Results: Recent Labs    07/30/20 1137 07/31/20 0341  HGB 11.9* 10.9*  HCT 37.0 33.0*   BMET Recent Labs    07/31/20 0341  NA 133*  K 4.4  CL 104  CO2 22  GLUCOSE 142*  BUN 12  CREATININE 1.09*  CALCIUM 8.5*     Studies/Results: No results found.  Assessment/Plan: Julia Deleon is a 75yo female with grade 3 cystocele who is POD1 s/p robotic supracervical hysterectomy, bilateral salpingo-oophorectomy, and sacrocolpopexy. Overall doing well.  - Advanced to regular diet. Stop IVF. Bowel regimen - Vaginal packing removed this morning - Remove foley/TOV. Patient has history of incomplete emptying and has deferred I&O catheterization and foley placement in the past - SCH'd/PRN analgesics and anti-emetics - SCDs, IS, encourage ambulation - Bactrim for peri-procedural prophylaxis - EDD later today versus tomorrow    LOS: 0 days    Celene Squibb 07/31/2020, 7:50 AM

## 2020-08-01 LAB — SURGICAL PATHOLOGY

## 2020-08-01 LAB — HEMOGLOBIN AND HEMATOCRIT, BLOOD
HCT: 32.5 % — ABNORMAL LOW (ref 36.0–46.0)
Hemoglobin: 10.9 g/dL — ABNORMAL LOW (ref 12.0–15.0)

## 2020-08-01 NOTE — Plan of Care (Signed)
  Problem: Clinical Measurements: Goal: Ability to maintain clinical measurements within normal limits will improve Outcome: Progressing   Problem: Elimination: Goal: Will not experience complications related to urinary retention Outcome: Progressing   Problem: Pain Managment: Goal: General experience of comfort will improve Outcome: Progressing   Problem: Skin Integrity: Goal: Risk for impaired skin integrity will decrease Outcome: Progressing

## 2020-08-01 NOTE — Discharge Summary (Signed)
Date of admission: 07/30/2020  Date of discharge: 08/01/2020  Admission diagnosis: Cystocele  Discharge diagnosis: Cystocele  Secondary diagnoses: None  History and Physical: For full details, please see admission history and physical. Briefly, Julia Deleon is a 75 y.o. female with grade 3 cystocele.  Hospital Course:  She underwent robotic supracervical hysterectomy, bilateral salpingo-oophorectomy, and sacrocolpopexy with Dr. Louis Meckel on 07/31/20. She tolerated the procedure well and was transferred to the floor after receiving routine post-operative care. Her diet was gradually advanced, and her pain was controlled with analgesics.   Her vaginal packing and foley catheter were removed on POD1. She subsequently passed a trial of void. She developed a mild erythematous, non-pruritic rash around her abdominal incisions later on POD1, likely due to an allergic reaction, possibly to local anesthetic used intra-operatively. Cellulitis unlikely given timing from surgery. This rash was monitored and appeared to remain stable/slightly improved during her hospitalization.   By POD2, she was tolerating a regular diet without nausea/emesis, voiding spontaneously with good urine output, ambulating without difficulty, and having pain well-controlled with oral analgesics. She was deemed appropriate for discharge home.   Physical Exam:  General: Alert and oriented, laying in bed in NAD CV: Regular rate Lungs: NWOB on RA Abdomen: Soft, appropriately tender, slightly distended. Incisions closed with moderate surrounding ecchymosis as well as faintly erythematous, non-pruritic rash that has remained stable/slightly improved compared to yesterday; covered with dermabond GU: Voiding spontaneously  Ext: Warm and well-perfused  Laboratory values: Recent Labs    07/30/20 1137 07/31/20 0341 08/01/20 0330  HGB 11.9* 10.9* 10.9*  HCT 37.0 33.0* 32.5*   Recent Labs    07/31/20 0341  CREATININE 1.09*     Disposition: Home  Discharge instruction: The patient was instructed to be ambulatory but told to refrain from heavy lifting, strenuous activity, or driving while on narcotics.   Discharge medications:  Allergies as of 08/01/2020      Reactions   Colesevelam Nausea And Vomiting   WELCHOL   Levofloxacin Hives   Amoxicillin    Benazepril Other (See Comments)   Methylisothiazolinone Other (See Comments)   Positive on allergy test   Thimerosal Other (See Comments)   Positive on allergy test   Amoxicillin-pot Clavulanate Nausea Only   Did it involve swelling of the face/tongue/throat, SOB, or low BP? No Did it involve sudden or severe rash/hives, skin peeling, or any reaction on the inside of your mouth or nose? No Did you need to seek medical attention at a hospital or doctor's office? Unknown When did it last happen?unknown If all above answers are "NO", may proceed with cephalosporin use.   Latex Rash   Family history of latex allergy   Simvastatin Itching      Medication List    STOP taking these medications   CoQ-10 100 MG Caps   CRANBERRY PO   Cyanocobalamin 1000 MCG Subl   DHEA 10 MG Caps   glucosamine-chondroitin 500-400 MG tablet   LUTEIN-ZEAXANTHIN PO   multivitamin with minerals Tabs tablet   Probiotic Caps   Turmeric 500 MG Caps   Ubiquinol 100 MG Caps   Vitamin D3 50 MCG (2000 UT) Tabs     TAKE these medications   acetaminophen 650 MG CR tablet Commonly known as: TYLENOL Take 650-1,300 mg by mouth every 8 (eight) hours as needed for pain.   azelastine 0.1 % nasal spray Commonly known as: ASTELIN Place 1 spray into both nostrils daily. Use in each nostril as directed  chlorzoxazone 500 MG tablet Commonly known as: PARAFON Take 1 tablet (500 mg total) by mouth 4 (four) times daily as needed for muscle spasms.   docusate sodium 100 MG capsule Commonly known as: COLACE Take 1 capsule (100 mg total) by mouth 2 (two) times daily.    EPINEPHrine 0.3 mg/0.3 mL Soaj injection Commonly known as: EPI-PEN Inject 0.3 mg into the muscle as needed for anaphylaxis.   estradiol 0.1 MG/GM vaginal cream Commonly known as: ESTRACE Place vaginally.   fexofenadine 60 MG tablet Commonly known as: ALLEGRA Take 60 mg by mouth daily.   levothyroxine 75 MCG tablet Commonly known as: SYNTHROID TAKE ONE TABLET EACH MORNING BEFORE BREAKFAST What changed:   how much to take  how to take this  when to take this   losartan 100 MG tablet Commonly known as: COZAAR Take 1 tablet (100 mg total) by mouth daily.   progesterone (bulk) Powd Apply 1 application topically See admin instructions. 4% compound- topically   sulfamethoxazole-trimethoprim 800-160 MG tablet Commonly known as: BACTRIM DS Take 1 tablet by mouth 2 (two) times daily.   traMADol 50 MG tablet Commonly known as: Ultram Take 1-2 tablets (50-100 mg total) by mouth every 6 (six) hours as needed for moderate pain or severe pain.   triamcinolone 55 MCG/ACT Aero nasal inhaler Commonly known as: Nasacort Allergy 24HR Place 2 sprays daily into the nose. What changed:   when to take this  reasons to take this       Followup:   Follow-up Information    Hollace Hayward, NP On 08/14/2020.   Why: at 10:00 Contact information: [address]. [place] [address] [phone]

## 2020-08-01 NOTE — Plan of Care (Signed)
  Problem: Health Behavior/Discharge Planning: Goal: Ability to manage health-related needs will improve Outcome: Adequate for Discharge   Problem: Clinical Measurements: Goal: Ability to maintain clinical measurements within normal limits will improve Outcome: Adequate for Discharge Goal: Will remain free from infection Outcome: Adequate for Discharge Goal: Diagnostic test results will improve Outcome: Adequate for Discharge Goal: Respiratory complications will improve Outcome: Adequate for Discharge Goal: Cardiovascular complication will be avoided Outcome: Adequate for Discharge   Problem: Activity: Goal: Risk for activity intolerance will decrease Outcome: Adequate for Discharge   Problem: Nutrition: Goal: Adequate nutrition will be maintained Outcome: Adequate for Discharge   Problem: Coping: Goal: Level of anxiety will decrease Outcome: Adequate for Discharge   Problem: Elimination: Goal: Will not experience complications related to bowel motility Outcome: Adequate for Discharge Goal: Will not experience complications related to urinary retention Outcome: Adequate for Discharge   Problem: Pain Managment: Goal: General experience of comfort will improve Outcome: Adequate for Discharge   Problem: Safety: Goal: Ability to remain free from injury will improve Outcome: Adequate for Discharge   Problem: Skin Integrity: Goal: Risk for impaired skin integrity will decrease Outcome: Adequate for Discharge   Problem: Education: Goal: Required Educational Video(s) Outcome: Adequate for Discharge   Problem: Clinical Measurements: Goal: Postoperative complications will be avoided or minimized Outcome: Adequate for Discharge   Problem: Skin Integrity: Goal: Demonstration of wound healing without infection will improve Outcome: Adequate for Discharge

## 2020-08-15 DIAGNOSIS — J301 Allergic rhinitis due to pollen: Secondary | ICD-10-CM | POA: Diagnosis not present

## 2020-08-20 DIAGNOSIS — J301 Allergic rhinitis due to pollen: Secondary | ICD-10-CM | POA: Diagnosis not present

## 2020-08-27 ENCOUNTER — Other Ambulatory Visit (HOSPITAL_COMMUNITY): Payer: Medicare Other

## 2020-08-27 DIAGNOSIS — N1831 Chronic kidney disease, stage 3a: Secondary | ICD-10-CM | POA: Diagnosis not present

## 2020-09-02 DIAGNOSIS — J320 Chronic maxillary sinusitis: Secondary | ICD-10-CM | POA: Diagnosis not present

## 2020-09-02 DIAGNOSIS — R600 Localized edema: Secondary | ICD-10-CM | POA: Diagnosis not present

## 2020-09-02 DIAGNOSIS — R338 Other retention of urine: Secondary | ICD-10-CM | POA: Diagnosis not present

## 2020-09-02 DIAGNOSIS — J305 Allergic rhinitis due to food: Secondary | ICD-10-CM | POA: Diagnosis not present

## 2020-09-02 DIAGNOSIS — J301 Allergic rhinitis due to pollen: Secondary | ICD-10-CM | POA: Diagnosis not present

## 2020-09-02 DIAGNOSIS — I10 Essential (primary) hypertension: Secondary | ICD-10-CM | POA: Diagnosis not present

## 2020-09-02 DIAGNOSIS — N1831 Chronic kidney disease, stage 3a: Secondary | ICD-10-CM | POA: Diagnosis not present

## 2020-09-04 ENCOUNTER — Telehealth: Payer: Self-pay

## 2020-09-04 NOTE — Telephone Encounter (Signed)
Routing to provider  

## 2020-09-04 NOTE — Telephone Encounter (Signed)
Copied from [place] [id number]. Topic: General - Other >> Sep 04, 2020  9:05 AM Keene Breath wrote: Reason for CRM: Patient called to ask if the doctor could put in orders for her to get a B12 blood test.  She would also like to know if she needs to fast.  Please call patient to discuss further at [phone]

## 2020-09-05 NOTE — Telephone Encounter (Signed)
Patient notified of Dr.Johnson recommendations and per Dr.Johnson advised patient she would need to come in sooner to have her labs completed and to discuss her fatigue. Patient states she would contact our office back to rescheduled as patient is not at home currently.

## 2020-09-05 NOTE — Telephone Encounter (Signed)
We can do that at her appointment and she will need to fast for her cholesterol but not for the b12

## 2020-09-11 DIAGNOSIS — M25561 Pain in right knee: Secondary | ICD-10-CM | POA: Diagnosis not present

## 2020-09-11 DIAGNOSIS — M25562 Pain in left knee: Secondary | ICD-10-CM | POA: Diagnosis not present

## 2020-09-11 DIAGNOSIS — M7122 Synovial cyst of popliteal space [Baker], left knee: Secondary | ICD-10-CM | POA: Diagnosis not present

## 2020-09-11 DIAGNOSIS — G8929 Other chronic pain: Secondary | ICD-10-CM | POA: Diagnosis not present

## 2020-09-11 DIAGNOSIS — M25462 Effusion, left knee: Secondary | ICD-10-CM | POA: Diagnosis not present

## 2020-09-11 DIAGNOSIS — M170 Bilateral primary osteoarthritis of knee: Secondary | ICD-10-CM | POA: Diagnosis not present

## 2020-09-11 DIAGNOSIS — M25461 Effusion, right knee: Secondary | ICD-10-CM | POA: Diagnosis not present

## 2020-09-15 DIAGNOSIS — R0600 Dyspnea, unspecified: Secondary | ICD-10-CM | POA: Diagnosis not present

## 2020-09-15 DIAGNOSIS — J310 Chronic rhinitis: Secondary | ICD-10-CM | POA: Diagnosis not present

## 2020-09-18 DIAGNOSIS — R3914 Feeling of incomplete bladder emptying: Secondary | ICD-10-CM | POA: Diagnosis not present

## 2020-09-18 DIAGNOSIS — N8111 Cystocele, midline: Secondary | ICD-10-CM | POA: Diagnosis not present

## 2020-09-19 ENCOUNTER — Encounter: Payer: Self-pay | Admitting: Nurse Practitioner

## 2020-09-19 ENCOUNTER — Ambulatory Visit (INDEPENDENT_AMBULATORY_CARE_PROVIDER_SITE_OTHER): Payer: Medicare Other | Admitting: Nurse Practitioner

## 2020-09-19 ENCOUNTER — Other Ambulatory Visit: Payer: Self-pay

## 2020-09-19 ENCOUNTER — Ambulatory Visit: Payer: Self-pay | Admitting: *Deleted

## 2020-09-19 VITALS — BP 126/63 | HR 66 | Temp 98.1°F | Wt 163.2 lb

## 2020-09-19 DIAGNOSIS — R1013 Epigastric pain: Secondary | ICD-10-CM | POA: Diagnosis not present

## 2020-09-19 DIAGNOSIS — E038 Other specified hypothyroidism: Secondary | ICD-10-CM

## 2020-09-19 DIAGNOSIS — R197 Diarrhea, unspecified: Secondary | ICD-10-CM | POA: Diagnosis not present

## 2020-09-19 NOTE — Telephone Encounter (Signed)
Routing to provider  

## 2020-09-19 NOTE — Patient Instructions (Signed)
It was great to see you!  Keep eating yogurt and taking probiotics. Increase your fiber as well.    Take care, Vance Peper, NP    High-Fiber Eating Plan Fiber, also called dietary fiber, is a type of carbohydrate. It is found foods such as fruits, vegetables, whole grains, and beans. A high-fiber diet can have many health benefits. Your health care provider may recommend a high-fiber diet to help:  Prevent constipation. Fiber can make your bowel movements more regular.  Lower your cholesterol.  Relieve the following conditions: ? Inflammation of veins in the anus (hemorrhoids). ? Inflammation of specific areas of the digestive tract (uncomplicated diverticulosis). ? A problem of the large intestine, also called the colon, that sometimes causes pain and diarrhea (irritable bowel syndrome, or IBS).  Prevent overeating as part of a weight-loss plan.  Prevent heart disease, type 2 diabetes, and certain cancers. What are tips for following this plan? Reading food labels  Check the nutrition facts label on food products for the amount of dietary fiber. Choose foods that have 5 grams of fiber or more per serving.  The goals for recommended daily fiber intake include: ? Men (age 87 or younger): 34-38 g. ? Men (over age 72): 28-34 g. ? Women (age 23 or younger): 25-28 g. ? Women (over age 7): 22-25 g. Your daily fiber goal is _____________ g.   Shopping  Choose whole fruits and vegetables instead of processed forms, such as apple juice or applesauce.  Choose a wide variety of high-fiber foods such as avocados, lentils, oats, and kidney beans.  Read the nutrition facts label of the foods you choose. Be aware of foods with added fiber. These foods often have high sugar and sodium amounts per serving. Cooking  Use whole-grain flour for baking and cooking.  Cook with brown rice instead of white rice. Meal planning  Start the day with a breakfast that is high in fiber, such as  a cereal that contains 5 g of fiber or more per serving.  Eat breads and cereals that are made with whole-grain flour instead of refined flour or white flour.  Eat brown rice, bulgur wheat, or millet instead of white rice.  Use beans in place of meat in soups, salads, and pasta dishes.  Be sure that half of the grains you eat each day are whole grains. General information  You can get the recommended daily intake of dietary fiber by: ? Eating a variety of fruits, vegetables, grains, nuts, and beans. ? Taking a fiber supplement if you are not able to take in enough fiber in your diet. It is better to get fiber through food than from a supplement.  Gradually increase how much fiber you consume. If you increase your intake of dietary fiber too quickly, you may have bloating, cramping, or gas.  Drink plenty of water to help you digest fiber.  Choose high-fiber snacks, such as berries, raw vegetables, nuts, and popcorn. What foods should I eat? Fruits Berries. Pears. Apples. Oranges. Avocado. Prunes and raisins. Dried figs. Vegetables Sweet potatoes. Spinach. Kale. Artichokes. Cabbage. Broccoli. Cauliflower. Green peas. Carrots. Squash. Grains Whole-grain breads. Multigrain cereal. Oats and oatmeal. Brown rice. Barley. Bulgur wheat. [place]. Quinoa. Bran muffins. Popcorn. Rye wafer crackers. Meats and other proteins Navy beans, kidney beans, and pinto beans. Soybeans. Split peas. Lentils. Nuts and seeds. Dairy Fiber-fortified yogurt. Beverages Fiber-fortified soy milk. Fiber-fortified orange juice. Other foods Fiber bars. The items listed above may not be a complete list of  recommended foods and beverages. Contact a dietitian for more information. What foods should I avoid? Fruits Fruit juice. Cooked, strained fruit. Vegetables Fried potatoes. Canned vegetables. Well-cooked vegetables. Grains White bread. Pasta made with refined flour. White rice. Meats and other proteins Fatty  cuts of meat. Fried chicken or fried fish. Dairy Milk. Yogurt. Cream cheese. Sour cream. Fats and oils Butters. Beverages Soft drinks. Other foods Cakes and pastries. The items listed above may not be a complete list of foods and beverages to avoid. Talk with your dietitian about what choices are best for you. Summary  Fiber is a type of carbohydrate. It is found in foods such as fruits, vegetables, whole grains, and beans.  A high-fiber diet has many benefits. It can help to prevent constipation, lower blood cholesterol, aid weight loss, and reduce your risk of heart disease, diabetes, and certain cancers.  Increase your intake of fiber gradually. Increasing fiber too quickly may cause cramping, bloating, and gas. Drink plenty of water while you increase the amount of fiber you consume.  The best sources of fiber include whole fruits and vegetables, whole grains, nuts, seeds, and beans. This information is not intended to replace advice given to you by your health care provider. Make sure you discuss any questions you have with your health care provider. Document Revised: 08/30/2019 Document Reviewed: 08/30/2019 Elsevier Patient Education  2021 [employer].

## 2020-09-19 NOTE — Assessment & Plan Note (Signed)
Check c-diff, O&P, and culture. Encouraged her to drink plenty of fluids.

## 2020-09-19 NOTE — Assessment & Plan Note (Signed)
No tenderness to palpation. With pain getting better with food, will check stool for h. Pylori. Also checking stool studies. Encouraged her to continue eating yogurt and taking her probiotics. She can also eat foods high in fiber. Will treat based on results.

## 2020-09-19 NOTE — Telephone Encounter (Signed)
Pt coming in today

## 2020-09-19 NOTE — Telephone Encounter (Signed)
Pt called stating that she has a recurring stomach bug from a month ago. She states that she is currently imodium and it is helping some, but is not sure what to take to make it go away completely. Please advise.   Attempted to call patient- left message on VM to call office.

## 2020-09-19 NOTE — Telephone Encounter (Addendum)
See initial encounter; pt called back stating she had a diarrhea x 1 episode on 09/14/20; she took 1 Imodium at that time; her symptoms resolved but returned on 09/18/20 and she had 3 episodes of soft stools; she took 1 Imodium at lunch and dinner; the pt had another episode this morning and took another episode; the pt says she discussed this with urology on 09/18/20 and was offered medication but she is not sure if it was called in; recommendations made per nurse triage protocol; the pt had surgery on 08/02/20 and was prescribed antibiotics; the pt says she does not understand because she believes she got a stomach bug from her husband; explained that in this instance I have to ere on the side of caution; the pt previously declined virtual visit today and would like to keep her appt on 09/23/20 with Dr Park Liter; she would like further direction and recommendations per her PCP; the pt can be contacted at [phone]; will route to office for final disposition.

## 2020-09-19 NOTE — Assessment & Plan Note (Signed)
Chronic, thyroid levels have remained normal. With recent diarrhea and stomach pain, will check TSH

## 2020-09-19 NOTE — Telephone Encounter (Signed)
  Reason for Disposition . [1] Recent antibiotic therapy (i.e., within last 2 months) AND [2] diarrhea present > 3 days since antibiotic was stopped  Answer Assessment - Initial Assessment Questions 1. DIARRHEA SEVERITY: "How bad is the diarrhea?" "How many more stools have you had in the past 24 hours than normal?"    - NO DIARRHEA (SCALE 0)   - MILD (SCALE 1-3): Few loose or mushy BMs; increase of 1-3 stools over normal daily number of stools; mild increase in ostomy output.   -  MODERATE (SCALE 4-7): Increase of 4-6 stools daily over normal; moderate increase in ostomy output.  SEVERE (SCALE 8-10; OR 'WORST POSSIBLE'): Increase of 7 or more stools daily over normal; moderate increase in ostomy output; incontinence.     1-3 2. ONSET: "When did the diarrhea begin?"      09/14/20; the resumed on 09/18/20 3. BM CONSISTENCY: "How loose or watery is the diarrhea?"      Very soft and loose 4. VOMITING: "Are you also vomiting?" If Yes, ask: "How many times in the past 24 hours?"      no 5. ABDOMINAL PAIN: "Are you having any abdominal pain?" If Yes, ask: "What does it feel like?" (e.g., crampy, dull, intermittent, constant)      no 6. ABDOMINAL PAIN SEVERITY: If present, ask: "How bad is the pain?"  (e.g., Scale 1-10; mild, moderate, or severe)   - MILD (1-3): doesn't interfere with normal activities, abdomen soft and not tender to touch    - MODERATE (4-7): interferes with normal activities or awakens from sleep, abdomen tender to touch    - SEVERE (8-10): excruciating pain, doubled over, unable to do any normal activities       n/a 7. ORAL INTAKE: If vomiting, "Have you been able to drink liquids?" "How much liquids have you had in the past 24 hours?"     n/a 8. HYDRATION: "Any signs of dehydration?" (e.g., dry mouth [not just dry lips], too weak to stand, dizziness, new weight loss) "When did you last urinate?"  n/a 9. EXPOSURE: "Have you traveled to a foreign country recently?" "Have you been  exposed to anyone with diarrhea?" "Could you have eaten any food that was spoiled?"   Husband had stomach bug 10. ANTIBIOTIC USE: "Are you taking antibiotics now or have you taken antibiotics in the past 2 months?"      Yes due to surgery 08/02/20 11. OTHER SYMPTOMS: "Do you have any other symptoms?" (e.g., fever, blood in stool)       no 12. PREGNANCY: "Is there any chance you are pregnant?" "When was your last menstrual period?"       no  Protocols used: DIARRHEA-A-AH

## 2020-09-19 NOTE — Progress Notes (Signed)
Acute Office Visit  Subjective:    Patient ID: Julia Deleon, female    DOB: 09-Jun-1945, 75 y.o.   MRN: [id number]  Chief Complaint  Patient presents with  . GI Problem    Pt states that back at the beginning of April, she was exposed to a stomach virus from her husband. States since then, her stomach has been aching and she has felt a little nauseous. This past Monday, the patient states she started having loose stools. States she has taken Imodium which has helped some    HPI Patient is in today for stomach pain and loose stools. She had a stomach bug that she got from her husband in April. She recovered mostly from that, however her stomach was still hurting and felt nauseous. She states that it felt like she was hungry and when she would eat the pain would get better, then the pain would come back after a few hours of not eating. She has been eating crackers frequently. She feels like she gained some weight recently.   ABDOMINAL PAIN   Duration:months Onset: gradual Severity: moderate Quality: aching and sore Location:  epigastric  Radiation: no Frequency: intermittent Alleviating factors: eating Aggravating factors: not eating Status: fluctuating Treatments attempted: immodium Fever: no Nausea: yes Vomiting: no Weight loss: no Decreased appetite: no Diarrhea: yes--she has been taking immodium Constipation: no Blood in stool: no Heartburn: no Jaundice: no Rash: no Dysuria/urinary frequency: no Hematuria: no History of sexually transmitted disease: no Recurrent NSAID use: no   Past Medical History:  Diagnosis Date  . Arthritis   . Chronic kidney disease   . COPD (chronic obstructive pulmonary disease) ([place])    pt denies   . Dyspnea   . Family history of adverse reaction to anesthesia    father was slow to wake up  . Fuch's endothelial dystrophy   . GERD (gastroesophageal reflux disease)   . Hyperlipidemia   . Hypertension   . Hypothyroidism   . Pneumonia     hx of   . PONV (postoperative nausea and vomiting)    slow to wake up and PONV    Past Surgical History:  Procedure Laterality Date  . APPENDECTOMY    . BACK SURGERY  09/26/2018   L4-5 PLIF by Dr. Arnoldo Morale  . CARDIAC CATHETERIZATION    . CHOLECYSTECTOMY    . EYE SURGERY    . FOOT SURGERY Right    pin removed left  . laser vein surgery    . NASAL SINUS SURGERY    . RIGHT/LEFT HEART CATH AND CORONARY ANGIOGRAPHY N/A 12/06/2017   Procedure: RIGHT/LEFT HEART CATH AND CORONARY ANGIOGRAPHY;  Surgeon: Yolonda Kida, MD;  Location: [facility];  Service: Cardiovascular;  Laterality: N/A;  . ROBOTIC ASSISTED LAPAROSCOPIC SACROCOLPOPEXY Bilateral 07/30/2020   Procedure: XI ROBOTIC ASSISTED LAPAROSCOPIC SACROCOLPOPEXY AND SUPRACERVICAL HYSTERECTOMY WITH BILATERAL SALPINGO OOPHERECTOMY;  Surgeon: Ardis Hughs, MD;  Location: [facility];  Service: Urology;  Laterality: Bilateral;  REQUESTING 4 HRS  . TONSILLECTOMY      Family History  Problem Relation Age of Onset  . Cancer Mother        lung  . CAD Mother   . Stroke Father   . Breast cancer Sister        54s  . Varicose Veins Son   . Heart disease Maternal Grandmother   . Cancer Paternal Grandmother        throat  . Emphysema Paternal Grandfather   .  Fibromyalgia Sister   . Brain cancer Sister   . Breast cancer Other     Social History   Socioeconomic History  . Marital status: Married    Spouse name: Not on Deleon  . Number of children: Not on Deleon  . Years of education: 4  . Highest education level: Bachelor's degree (e.g., BA, AB, BS)  Occupational History  . Occupation: retired  Tobacco Use  . Smoking status: Never Smoker  . Smokeless tobacco: Never Used  Vaping Use  . Vaping Use: Never used  Substance and Sexual Activity  . Alcohol use: No    Alcohol/week: 0.0 standard drinks  . Drug use: No  . Sexual activity: Not Currently    Birth control/protection: Post-menopausal  Other Topics Concern  .  Not on Deleon  Social History Narrative  . Not on Deleon   Social Determinants of Health   Financial Resource Strain: Low Risk   . Difficulty of Paying Living Expenses: Not hard at all  Food Insecurity: No Food Insecurity  . Worried About Charity fundraiser in the Last Year: Never true  . Ran Out of Food in the Last Year: Never true  Transportation Needs: No Transportation Needs  . Lack of Transportation (Medical): No  . Lack of Transportation (Non-Medical): No  Physical Activity: Insufficiently Active  . Days of Exercise per Week: 3 days  . Minutes of Exercise per Session: 40 min  Stress: No Stress Concern Present  . Feeling of Stress : Not at all  Social Connections: Not on Deleon  Intimate Partner Violence: Not on Deleon    Outpatient Medications Prior to Visit  Medication Sig Dispense Refill  . acetaminophen (TYLENOL) 650 MG CR tablet Take 650-1,300 mg by mouth every 8 (eight) hours as needed for pain.     Marland Kitchen azelastine (ASTELIN) 0.1 % nasal spray Place 1 spray into both nostrils daily. Use in each nostril as directed    . chlorzoxazone (PARAFON) 500 MG tablet Take 1 tablet (500 mg total) by mouth 4 (four) times daily as needed for muscle spasms. 120 tablet 1  . docusate sodium (COLACE) 100 MG capsule Take 1 capsule (100 mg total) by mouth 2 (two) times daily.    Marland Kitchen EPINEPHrine 0.3 mg/0.3 mL IJ SOAJ injection Inject 0.3 mg into the muscle as needed for anaphylaxis.    Marland Kitchen estradiol (ESTRACE) 0.1 MG/GM vaginal cream Place vaginally.    . fexofenadine (ALLEGRA) 60 MG tablet Take 60 mg by mouth daily.    Marland Kitchen levothyroxine (SYNTHROID) 75 MCG tablet TAKE ONE TABLET EACH MORNING BEFORE BREAKFAST (Patient taking differently: Take 75 mcg by mouth daily before breakfast. TAKE ONE TABLET EACH MORNING BEFORE BREAKFAST) 90 tablet 3  . losartan (COZAAR) 100 MG tablet Take 1 tablet (100 mg total) by mouth daily. 90 tablet 1  . Progesterone Micronized (PROGESTERONE, BULK,) POWD Apply 1 application  topically See admin instructions. 4% compound- topically 100 g 11  . triamcinolone (NASACORT ALLERGY 24HR) 55 MCG/ACT AERO nasal inhaler Place 2 sprays daily into the nose. (Patient taking differently: Place 2 sprays into the nose daily as needed (allergies).) 1 Inhaler 12  . sulfamethoxazole-trimethoprim (BACTRIM DS) 800-160 MG tablet Take 1 tablet by mouth 2 (two) times daily. 6 tablet 0  . traMADol (ULTRAM) 50 MG tablet Take 1-2 tablets (50-100 mg total) by mouth every 6 (six) hours as needed for moderate pain or severe pain. 20 tablet 0   Facility-Administered Medications Prior to Visit  Medication Dose  Route Frequency Provider Last Rate Last Admin  . sodium phosphate (FLEET) 7-19 GM/118ML enema 1 enema  1 enema Rectal Once Ardis Hughs, MD        Allergies  Allergen Reactions  . Colesevelam Nausea And Vomiting    WELCHOL   . Levofloxacin Hives  . Amoxicillin   . Benazepril Other (See Comments)  . Methylisothiazolinone Other (See Comments)    Positive on allergy test  . Thimerosal Other (See Comments)    Positive on allergy test  . Amoxicillin-Pot Clavulanate Nausea Only    Did it involve swelling of the face/tongue/throat, SOB, or low BP? No Did it involve sudden or severe rash/hives, skin peeling, or any reaction on the inside of your mouth or nose? No Did you need to seek medical attention at a hospital or doctor's office? Unknown When did it last happen?unknown If all above answers are "NO", may proceed with cephalosporin use.   . Latex Rash    Family history of latex allergy  . Simvastatin Itching    Review of Systems  Constitutional: Positive for fatigue. Negative for fever.  HENT: Negative.   Respiratory: Negative.   Cardiovascular: Negative.   Gastrointestinal: Positive for abdominal pain, diarrhea and nausea. Negative for blood in stool, constipation and vomiting.  Genitourinary: Negative.   Musculoskeletal: Positive for arthralgias (chronic).   Skin: Negative.   Allergic/Immunologic: Positive for environmental allergies.  Neurological: Negative.   Psychiatric/Behavioral: Negative.        Objective:    Physical Exam Vitals and nursing note reviewed.  Constitutional:      General: She is not in acute distress.    Appearance: Normal appearance.  HENT:     Head: Normocephalic.  Eyes:     Conjunctiva/sclera: Conjunctivae normal.  Cardiovascular:     Rate and Rhythm: Normal rate and regular rhythm.     Pulses: Normal pulses.     Heart sounds: Normal heart sounds.  Pulmonary:     Effort: Pulmonary effort is normal.     Breath sounds: Normal breath sounds.  Abdominal:     General: Bowel sounds are normal.     Palpations: Abdomen is soft.     Tenderness: There is no abdominal tenderness. There is no rebound.     Hernia: No hernia is present.  Musculoskeletal:     Cervical back: Normal range of motion.  Skin:    General: Skin is warm.  Neurological:     General: No focal deficit present.     Mental Status: She is alert and oriented to person, place, and time.  Psychiatric:        Mood and Affect: Mood normal.        Behavior: Behavior normal.        Thought Content: Thought content normal.        Judgment: Judgment normal.     BP 126/63   Pulse 66   Temp 98.1 F (36.7 C) (Oral)   Wt 163 lb 3.2 oz (74 kg)   LMP  (LMP Unknown)   SpO2 97%   BMI 28.01 kg/m  Wt Readings from Last 3 Encounters:  09/19/20 163 lb 3.2 oz (74 kg)  07/30/20 160 lb (72.6 kg)  07/17/20 160 lb (72.6 kg)    Health Maintenance Due  Topic Date Due  . COVID-19 Vaccine (4 - Booster for Moderna series) 06/21/2020    There are no preventive care reminders to display for this patient.   Lab Results  Component Value  Date   TSH 3.070 04/11/2020   Lab Results  Component Value Date   WBC 8.3 07/24/2020   HGB 10.9 (L) 08/01/2020   HCT 32.5 (L) 08/01/2020   MCV 94.9 07/24/2020   PLT 227 07/24/2020   Lab Results  Component Value  Date   NA 133 (L) 07/31/2020   K 4.4 07/31/2020   CO2 22 07/31/2020   GLUCOSE 142 (H) 07/31/2020   BUN 12 07/31/2020   CREATININE 1.09 (H) 07/31/2020   BILITOT 0.7 07/24/2020   ALKPHOS 51 07/24/2020   AST 19 07/24/2020   ALT 21 07/24/2020   PROT 6.8 07/24/2020   ALBUMIN 3.9 07/24/2020   CALCIUM 8.5 (L) 07/31/2020   ANIONGAP 7 07/31/2020   Lab Results  Component Value Date   CHOL 217 (H) 04/11/2020   Lab Results  Component Value Date   HDL 38 (L) 04/11/2020   Lab Results  Component Value Date   LDLCALC 157 (H) 04/11/2020   Lab Results  Component Value Date   TRIG 119 04/11/2020   Lab Results  Component Value Date   CHOLHDL 5.2 (H) 03/30/2018   No results found for: HGBA1C     Assessment & Plan:   Problem List Items Addressed This Visit      Endocrine   Hypothyroidism    Chronic, thyroid levels have remained normal. With recent diarrhea and stomach pain, will check TSH      Relevant Orders   TSH     Other   Diarrhea    Check c-diff, O&P, and culture. Encouraged her to drink plenty of fluids.      Relevant Orders   Cdiff NAA+O+P+Stool Culture   CBC with Differential   Comp Met (CMET)   H. pylori antigen, stool   Epigastric pain - Primary    No tenderness to palpation. With pain getting better with food, will check stool for h. Pylori. Also checking stool studies. Encouraged her to continue eating yogurt and taking her probiotics. She can also eat foods high in fiber. Will treat based on results.          No orders of the defined types were placed in this encounter.    Charyl Dancer, NP

## 2020-09-20 LAB — COMPREHENSIVE METABOLIC PANEL
ALT: 15 IU/L (ref 0–32)
AST: 16 IU/L (ref 0–40)
Albumin/Globulin Ratio: 1.7 (ref 1.2–2.2)
Albumin: 3.9 g/dL (ref 3.7–4.7)
Alkaline Phosphatase: 58 IU/L (ref 44–121)
BUN/Creatinine Ratio: 21 (ref 12–28)
BUN: 24 mg/dL (ref 8–27)
Bilirubin Total: 0.3 mg/dL (ref 0.0–1.2)
CO2: 22 mmol/L (ref 20–29)
Calcium: 9 mg/dL (ref 8.7–10.3)
Chloride: 104 mmol/L (ref 96–106)
Creatinine, Ser: 1.13 mg/dL — ABNORMAL HIGH (ref 0.57–1.00)
Globulin, Total: 2.3 g/dL (ref 1.5–4.5)
Glucose: 96 mg/dL (ref 65–99)
Potassium: 4.5 mmol/L (ref 3.5–5.2)
Sodium: 141 mmol/L (ref 134–144)
Total Protein: 6.2 g/dL (ref 6.0–8.5)
eGFR: 52 mL/min/{1.73_m2} — ABNORMAL LOW (ref >59)

## 2020-09-20 LAB — CBC WITH DIFFERENTIAL/PLATELET
Basophils Absolute: 0.1 10*3/uL (ref 0.0–0.2)
Basos: 1 %
EOS (ABSOLUTE): 0.3 10*3/uL (ref 0.0–0.4)
Eos: 3 %
Hematocrit: 35.3 % (ref 34.0–46.6)
Hemoglobin: 12 g/dL (ref 11.1–15.9)
Immature Grans (Abs): 0.1 10*3/uL (ref 0.0–0.1)
Immature Granulocytes: 1 %
Lymphocytes Absolute: 3 10*3/uL (ref 0.7–3.1)
Lymphs: 33 %
MCH: 32 pg (ref 26.6–33.0)
MCHC: 34 g/dL (ref 31.5–35.7)
MCV: 94 fL (ref 79–97)
Monocytes Absolute: 0.8 10*3/uL (ref 0.1–0.9)
Monocytes: 9 %
Neutrophils Absolute: 5 10*3/uL (ref 1.4–7.0)
Neutrophils: 53 %
Platelets: 218 10*3/uL (ref 150–450)
RBC: 3.75 x10E6/uL — ABNORMAL LOW (ref 3.77–5.28)
RDW: 12.4 % (ref 11.7–15.4)
WBC: 9.2 10*3/uL (ref 3.4–10.8)

## 2020-09-20 LAB — TSH: TSH: 2.02 u[IU]/mL (ref 0.450–4.500)

## 2020-09-22 ENCOUNTER — Other Ambulatory Visit: Payer: Medicare Other

## 2020-09-22 ENCOUNTER — Other Ambulatory Visit: Payer: Self-pay

## 2020-09-22 DIAGNOSIS — R197 Diarrhea, unspecified: Secondary | ICD-10-CM | POA: Diagnosis not present

## 2020-09-22 NOTE — Addendum Note (Signed)
Addended byRudie Meyer on: 09/22/2020 11:27 AM   Modules accepted: Orders

## 2020-09-23 ENCOUNTER — Ambulatory Visit: Payer: Medicare Other | Admitting: Family Medicine

## 2020-09-23 DIAGNOSIS — R3915 Urgency of urination: Secondary | ICD-10-CM | POA: Diagnosis not present

## 2020-09-24 ENCOUNTER — Ambulatory Visit (INDEPENDENT_AMBULATORY_CARE_PROVIDER_SITE_OTHER): Payer: Medicare Other | Admitting: Internal Medicine

## 2020-09-24 ENCOUNTER — Encounter: Payer: Self-pay | Admitting: Internal Medicine

## 2020-09-24 ENCOUNTER — Other Ambulatory Visit: Payer: Self-pay

## 2020-09-24 VITALS — BP 128/66 | HR 60 | Temp 98.6°F | Ht 63.0 in | Wt 162.8 lb

## 2020-09-24 DIAGNOSIS — Z1329 Encounter for screening for other suspected endocrine disorder: Secondary | ICD-10-CM | POA: Diagnosis not present

## 2020-09-24 DIAGNOSIS — E538 Deficiency of other specified B group vitamins: Secondary | ICD-10-CM

## 2020-09-24 DIAGNOSIS — E785 Hyperlipidemia, unspecified: Secondary | ICD-10-CM

## 2020-09-24 DIAGNOSIS — I208 Other forms of angina pectoris: Secondary | ICD-10-CM | POA: Diagnosis not present

## 2020-09-24 DIAGNOSIS — E038 Other specified hypothyroidism: Secondary | ICD-10-CM | POA: Diagnosis not present

## 2020-09-24 DIAGNOSIS — I10 Essential (primary) hypertension: Secondary | ICD-10-CM | POA: Diagnosis not present

## 2020-09-24 DIAGNOSIS — E782 Mixed hyperlipidemia: Secondary | ICD-10-CM | POA: Diagnosis not present

## 2020-09-24 DIAGNOSIS — E6609 Other obesity due to excess calories: Secondary | ICD-10-CM | POA: Diagnosis not present

## 2020-09-24 DIAGNOSIS — N183 Chronic kidney disease, stage 3 unspecified: Secondary | ICD-10-CM

## 2020-09-24 DIAGNOSIS — R7989 Other specified abnormal findings of blood chemistry: Secondary | ICD-10-CM | POA: Diagnosis not present

## 2020-09-24 DIAGNOSIS — Z6833 Body mass index (BMI) 33.0-33.9, adult: Secondary | ICD-10-CM | POA: Diagnosis not present

## 2020-09-24 DIAGNOSIS — R600 Localized edema: Secondary | ICD-10-CM | POA: Diagnosis not present

## 2020-09-24 DIAGNOSIS — R5383 Other fatigue: Secondary | ICD-10-CM | POA: Diagnosis not present

## 2020-09-24 DIAGNOSIS — R0602 Shortness of breath: Secondary | ICD-10-CM | POA: Diagnosis not present

## 2020-09-24 DIAGNOSIS — I83893 Varicose veins of bilateral lower extremities with other complications: Secondary | ICD-10-CM | POA: Diagnosis not present

## 2020-09-24 DIAGNOSIS — J439 Emphysema, unspecified: Secondary | ICD-10-CM | POA: Diagnosis not present

## 2020-09-24 MED ORDER — ONDANSETRON HCL 8 MG PO TABS: 8.0000 mg | ORAL_TABLET | Freq: Three times a day (TID) | ORAL | 0 refills | Status: DC | PRN

## 2020-09-24 NOTE — Progress Notes (Signed)
BP 128/66   Pulse 60   Temp 98.6 F (37 C) (Oral)   Ht '5\' 3"'  (1.6 m)   Wt 162 lb 12.8 oz (73.8 kg)   LMP  (LMP Unknown)   SpO2 98%   BMI 28.84 kg/m    Subjective:    Patient ID: Julia Deleon, female    DOB: 10/07/1945, 75 y.o.   MRN: [id number]  HPI: Julia Deleon is a 75 y.o. female  Pt had a hysterecotmy march 23rd and a bladder tag a week after this she had a stomach virus and never lost the feeling of nausea. Last Monday woke up with diarrhea Cdiff negative. Took imodium for such  COPD There is no chest tightness, cough, difficulty breathing, frequent throat clearing, hemoptysis, hoarse voice, shortness of breath, sputum production or wheezing. Pertinent negatives include no malaise/fatigue. Her past medical history is significant for COPD.  Hypertension This is a chronic problem. The problem is controlled. Pertinent negatives include no anxiety, blurred vision, malaise/fatigue, neck pain, peripheral edema or shortness of breath.  Hyperlipidemia This is a chronic problem. The problem is controlled. Pertinent negatives include no shortness of breath.    Chief Complaint  Patient presents with  . Fatigue    Seen Lauren on 5/13 for epigastric pain, did a stool sample on Monday.  . Coronary Artery Disease  . COPD  . Follow-up  . Hypothyroidism  . Hypertension  . Hyperlipidemia    Relevant past medical, surgical, family and social history reviewed and updated as indicated. Interim medical history since our last visit reviewed. Allergies and medications reviewed and updated.  Review of Systems  Constitutional: Negative for malaise/fatigue.  HENT: Negative for hoarse voice.   Eyes: Negative for blurred vision.  Respiratory: Negative for cough, hemoptysis, sputum production, shortness of breath and wheezing.   Musculoskeletal: Negative for neck pain.    Per HPI unless specifically indicated above     Objective:    BP 128/66   Pulse 60   Temp 98.6 F (37 C) (Oral)    Ht '5\' 3"'  (1.6 m)   Wt 162 lb 12.8 oz (73.8 kg)   LMP  (LMP Unknown)   SpO2 98%   BMI 28.84 kg/m   Wt Readings from Last 3 Encounters:  09/24/20 162 lb 12.8 oz (73.8 kg)  09/19/20 163 lb 3.2 oz (74 kg)  07/30/20 160 lb (72.6 kg)    Physical Exam Vitals and nursing note reviewed.  Constitutional:      General: She is not in acute distress.    Appearance: Normal appearance. She is not ill-appearing or diaphoretic.  HENT:     Head: Normocephalic and atraumatic.     Right Ear: Tympanic membrane and external ear normal. There is no impacted cerumen.     Left Ear: External ear normal.     Nose: No congestion or rhinorrhea.     Mouth/Throat:     Pharynx: No oropharyngeal exudate or posterior oropharyngeal erythema.  Eyes:     Conjunctiva/sclera: Conjunctivae normal.     Pupils: Pupils are equal, round, and reactive to light.  Cardiovascular:     Rate and Rhythm: Normal rate and regular rhythm.     Heart sounds: No murmur heard. No friction rub. No gallop.   Pulmonary:     Effort: No respiratory distress.     Breath sounds: No stridor. No wheezing or rhonchi.  Chest:     Chest wall: No tenderness.  Abdominal:  General: Abdomen is flat. Bowel sounds are normal. There is no distension.     Palpations: Abdomen is soft. There is no mass.     Tenderness: There is no abdominal tenderness. There is no guarding.  Musculoskeletal:        General: No swelling or deformity.     Cervical back: Normal range of motion and neck supple. No rigidity or tenderness.     Right lower leg: No edema.     Left lower leg: No edema.  Skin:    General: Skin is warm and dry.     Coloration: Skin is not jaundiced.     Findings: No erythema.  Neurological:     Mental Status: She is alert and oriented to person, place, and time. Mental status is at baseline.  Psychiatric:        Mood and Affect: Mood normal.        Behavior: Behavior normal.        Thought Content: Thought content normal.         Judgment: Judgment normal.     Results for orders placed or performed in visit on 09/19/20  Cdiff NAA+O+P+Stool Culture   Specimen: Stool   ST  Result Value Ref Range   Salmonella/Shigella Screen Final report    Stool Culture result 1 (RSASHR) Comment    Campylobacter Culture Preliminary report    Stool Culture result 1 (CMPCXR) Comment    E coli, Shiga toxin Assay Negative Negative   OVA + PARASITE EXAM WILL FOLLOW    Toxigenic C. Difficile by PCR CANCELED   CBC with Differential  Result Value Ref Range   WBC 9.2 3.4 - 10.8 x10E3/uL   RBC 3.75 (L) 3.77 - 5.28 x10E6/uL   Hemoglobin 12.0 11.1 - 15.9 g/dL   Hematocrit 35.3 34.0 - 46.6 %   MCV 94 79 - 97 fL   MCH 32.0 26.6 - 33.0 pg   MCHC 34.0 31.5 - 35.7 g/dL   RDW 12.4 11.7 - 15.4 %   Platelets 218 150 - 450 x10E3/uL   Neutrophils 53 Not Estab. %   Lymphs 33 Not Estab. %   Monocytes 9 Not Estab. %   Eos 3 Not Estab. %   Basos 1 Not Estab. %   Neutrophils Absolute 5.0 1.4 - 7.0 x10E3/uL   Lymphocytes Absolute 3.0 0.7 - 3.1 x10E3/uL   Monocytes Absolute 0.8 0.1 - 0.9 x10E3/uL   EOS (ABSOLUTE) 0.3 0.0 - 0.4 x10E3/uL   Basophils Absolute 0.1 0.0 - 0.2 x10E3/uL   Immature Granulocytes 1 Not Estab. %   Immature Grans (Abs) 0.1 0.0 - 0.1 x10E3/uL  Comp Met (CMET)  Result Value Ref Range   Glucose 96 65 - 99 mg/dL   BUN 24 8 - 27 mg/dL   Creatinine, Ser 1.13 (H) 0.57 - 1.00 mg/dL   eGFR 52 (L) >59 mL/min/1.73   BUN/Creatinine Ratio 21 12 - 28   Sodium 141 134 - 144 mmol/L   Potassium 4.5 3.5 - 5.2 mmol/L   Chloride 104 96 - 106 mmol/L   CO2 22 20 - 29 mmol/L   Calcium 9.0 8.7 - 10.3 mg/dL   Total Protein 6.2 6.0 - 8.5 g/dL   Albumin 3.9 3.7 - 4.7 g/dL   Globulin, Total 2.3 1.5 - 4.5 g/dL   Albumin/Globulin Ratio 1.7 1.2 - 2.2   Bilirubin Total 0.3 0.0 - 1.2 mg/dL   Alkaline Phosphatase 58 44 - 121 IU/L   AST 16 0 -  40 IU/L   ALT 15 0 - 32 IU/L  TSH  Result Value Ref Range   TSH 2.020 0.450 - 4.500 uIU/mL         Current Outpatient Medications:  .  acetaminophen (TYLENOL) 650 MG CR tablet, Take 650-1,300 mg by mouth every 8 (eight) hours as needed for pain. , Disp: , Rfl:  .  azelastine (ASTELIN) 0.1 % nasal spray, Place 1 spray into both nostrils daily. Use in each nostril as directed, Disp: , Rfl:  .  chlorzoxazone (PARAFON) 500 MG tablet, Take 1 tablet (500 mg total) by mouth 4 (four) times daily as needed for muscle spasms., Disp: 120 tablet, Rfl: 1 .  docusate sodium (COLACE) 100 MG capsule, Take 1 capsule (100 mg total) by mouth 2 (two) times daily., Disp: , Rfl:  .  EPINEPHrine 0.3 mg/0.3 mL IJ SOAJ injection, Inject 0.3 mg into the muscle as needed for anaphylaxis., Disp: , Rfl:  .  estradiol (ESTRACE) 0.1 MG/GM vaginal cream, Place vaginally., Disp: , Rfl:  .  fexofenadine (ALLEGRA) 60 MG tablet, Take 60 mg by mouth daily., Disp: , Rfl:  .  levothyroxine (SYNTHROID) 75 MCG tablet, TAKE ONE TABLET EACH MORNING BEFORE BREAKFAST (Patient taking differently: Take 75 mcg by mouth daily before breakfast. TAKE ONE TABLET EACH MORNING BEFORE BREAKFAST), Disp: 90 tablet, Rfl: 3 .  losartan (COZAAR) 100 MG tablet, Take 1 tablet (100 mg total) by mouth daily., Disp: 90 tablet, Rfl: 1 .  Progesterone Micronized (PROGESTERONE, BULK,) POWD, Apply 1 application topically See admin instructions. 4% compound- topically, Disp: 100 g, Rfl: 11 .  triamcinolone (NASACORT ALLERGY 24HR) 55 MCG/ACT AERO nasal inhaler, Place 2 sprays daily into the nose. (Patient taking differently: Place 2 sprays into the nose daily as needed (allergies).), Disp: 1 Inhaler, Rfl: 12 No current facility-administered medications for this visit.  Facility-Administered Medications Ordered in Other Visits:  .  sodium phosphate (FLEET) 7-19 GM/118ML enema 1 enema, 1 enema, Rectal, Once, Ardis Hughs, MD    Assessment & Plan:  Nausea : since surgery in march  h pylori test pending.  Will start pt on zofran.  Feels hot flashes  mild since the surgery  Consider Gi referal   Diarrhea resolved.   Fatigue ? Sec to recent surgery. Pt wants to be checked for b12 deficiency  Will check labs.   Problem List Items Addressed This Visit   None   Visit Diagnoses    B12 deficiency    -  Primary   Relevant Orders   Vitamin B12   Elevated serum creatinine       Relevant Orders   Basic metabolic panel       Follow up plan: No follow-ups on file.

## 2020-09-25 ENCOUNTER — Ambulatory Visit: Payer: Medicare Other | Admitting: Family Medicine

## 2020-09-25 NOTE — Progress Notes (Signed)
Please let pt know this was normal.

## 2020-09-26 ENCOUNTER — Ambulatory Visit: Payer: Medicare Other | Admitting: Internal Medicine

## 2020-09-26 DIAGNOSIS — M47816 Spondylosis without myelopathy or radiculopathy, lumbar region: Secondary | ICD-10-CM | POA: Diagnosis not present

## 2020-09-26 DIAGNOSIS — M5136 Other intervertebral disc degeneration, lumbar region: Secondary | ICD-10-CM | POA: Diagnosis not present

## 2020-09-26 LAB — BASIC METABOLIC PANEL
BUN/Creatinine Ratio: 26 (ref 12–28)
BUN: 29 mg/dL — ABNORMAL HIGH (ref 8–27)
CO2: 19 mmol/L — ABNORMAL LOW (ref 20–29)
Calcium: 9.3 mg/dL (ref 8.7–10.3)
Chloride: 102 mmol/L (ref 96–106)
Creatinine, Ser: 1.12 mg/dL — ABNORMAL HIGH (ref 0.57–1.00)
Glucose: 84 mg/dL (ref 65–99)
Potassium: 4.8 mmol/L (ref 3.5–5.2)
Sodium: 138 mmol/L (ref 134–144)
eGFR: 52 mL/min/{1.73_m2} — ABNORMAL LOW (ref >59)

## 2020-09-26 LAB — CDIFF NAA+O+P+STOOL CULTURE: E coli, Shiga toxin Assay: NEGATIVE

## 2020-09-26 LAB — VITAMIN B12: Vitamin B-12: 1320 pg/mL — ABNORMAL HIGH (ref 232–1245)

## 2020-09-29 ENCOUNTER — Telehealth: Payer: Self-pay | Admitting: Internal Medicine

## 2020-09-29 NOTE — Telephone Encounter (Signed)
Pt reported constipation as a result to her Zofran ondansetron (ZOFRAN) 8 MG tablet  She also states that the medication does not work. Pt is requesting an alternative option and also lab results, please advise  Best contact: [place], [place] [address]  [place] [address]  Phone: [phone] Fax: [phone]

## 2020-10-01 ENCOUNTER — Other Ambulatory Visit: Payer: Self-pay

## 2020-10-01 DIAGNOSIS — R1013 Epigastric pain: Secondary | ICD-10-CM

## 2020-10-01 NOTE — Telephone Encounter (Signed)
Spoke with patient she states that the Zofran is not working and causing nausea. Advised patient that Zofran does not cause nausea and that she should be hearing from our referral coordinator about her Urgent referral to GI. Also advised that she can take 1/2 a tablet of Zofran but she said that she would not since it did not work.

## 2020-10-02 ENCOUNTER — Encounter: Payer: Self-pay | Admitting: *Deleted

## 2020-10-03 ENCOUNTER — Other Ambulatory Visit: Payer: Self-pay | Admitting: Family Medicine

## 2020-10-03 NOTE — Telephone Encounter (Signed)
Scheduled 8/17

## 2020-10-03 NOTE — Telephone Encounter (Signed)
Requested medication (s) are due for refill today - yes  Requested medication (s) are on the active medication list -yes  Future visit scheduled -yes  Last refill: 02/28/20  Notes to clinic: Request non delegated Rx  Requested Prescriptions  Pending Prescriptions Disp Refills   chlorzoxazone (PARAFON) 500 MG tablet [Pharmacy Med Name: CHLORZOXAZONE 500 MG TAB] 120 tablet 1    Sig: TAKE ONE TABLET FOUR TIMES DAILY AS NEEDED FOR MUSCLE SPASM      Not Delegated - Analgesics:  Muscle Relaxants Failed - 10/03/2020  8:06 AM      Failed - This refill cannot be delegated      Passed - Valid encounter within last 6 months    Recent Outpatient Visits           1 week ago B12 deficiency   [place] [name], Avanti, MD   2 weeks ago Epigastric pain   [place], Lauren A, NP   5 months ago Other specified hypothyroidism   [facility] [place], [place], DO   1 year ago Other specified hypothyroidism   [facility] [place], Megan P, DO   1 year ago Acute lower UTI   [facility], Lilia Argue, [place]       Future Appointments             In 2 months [name], Avanti, MD [facility], [facility]   In 6 months  [employer] [employer], [place]                 Requested Prescriptions  Pending Prescriptions Disp Refills   chlorzoxazone (PARAFON) 500 MG tablet [Pharmacy Med Name: CHLORZOXAZONE 500 MG TAB] 120 tablet 1    Sig: TAKE ONE TABLET FOUR TIMES DAILY AS NEEDED FOR MUSCLE SPASM      Not Delegated - Analgesics:  Muscle Relaxants Failed - 10/03/2020  8:06 AM      Failed - This refill cannot be delegated      Passed - Valid encounter within last 6 months    Recent Outpatient Visits           1 week ago B12 deficiency   [place] [name], Avanti, MD   2 weeks ago Epigastric pain   [place], Lauren A, NP   5 months ago Other specified hypothyroidism    [facility] [place], [place], DO   1 year ago Other specified hypothyroidism   [facility] [place], [place], DO   1 year ago Acute lower UTI   [facility], Lilia Argue, [place]       Future Appointments             In 2 months [name], Avanti, MD [facility], [place]   In 6 months  [employer], [place]

## 2020-10-08 DIAGNOSIS — L509 Urticaria, unspecified: Secondary | ICD-10-CM | POA: Diagnosis not present

## 2020-10-09 ENCOUNTER — Telehealth: Payer: Self-pay

## 2020-10-09 ENCOUNTER — Encounter: Payer: Self-pay | Admitting: Gastroenterology

## 2020-10-09 ENCOUNTER — Ambulatory Visit (INDEPENDENT_AMBULATORY_CARE_PROVIDER_SITE_OTHER): Payer: Medicare Other | Admitting: Gastroenterology

## 2020-10-09 ENCOUNTER — Other Ambulatory Visit: Payer: Self-pay

## 2020-10-09 VITALS — BP 124/66 | HR 65 | Wt 160.8 lb

## 2020-10-09 DIAGNOSIS — R110 Nausea: Secondary | ICD-10-CM | POA: Diagnosis not present

## 2020-10-09 DIAGNOSIS — R197 Diarrhea, unspecified: Secondary | ICD-10-CM

## 2020-10-09 MED ORDER — OMEPRAZOLE 40 MG PO CPDR: 40.0000 mg | DELAYED_RELEASE_CAPSULE | Freq: Every day | ORAL | 0 refills | Status: DC

## 2020-10-09 MED ORDER — METAMUCIL 28 % PO PACK: 1.0000 | PACK | Freq: Every day | ORAL | 0 refills | Status: AC

## 2020-10-09 NOTE — Progress Notes (Signed)
Julia Deleon, [place] [address]  Main: [phone]  Fax: [phone]   Gastroenterology Consultation  Referring Provider:     Charlynne Cousins, MD Primary Care Physician:  Charlynne Cousins, MD Reason for Consultation:    Nausea, diarrhea        HPI:    Chief complaint: Nausea  Julia Deleon is a 75 y.o. y/o female referred for consultation & management  by Dr. Charlynne Cousins, MD.  Patient underwent hysterectomy in March 2022 and states a week later developed nausea vomiting and diarrhea that she attributes to a GI bug that she picked up from her husband.  The acute diarrhea had resolved 2 days later, but patient has had altered bowel habits since then and also nausea.  No blood in stool.  States sometimes has 2-3 loose bowel movements in a day.  C. difficile was negative but no other infectious work-up was done.  H. pylori stool antigen was ordered and submitted by patient on 09/22/2020 and we contacted the lab today to obtain these results but they do not know where the stool sample is.  Patient also reports taking an Advil daily for the last week but states the nausea has been going on longer than when she started the Advil. No dysphagia.  Patient took probiotics for her symptoms and these did not help.  She was also on PPI, Prilosec 20 mg once daily and she took it for a week with no relief in symptoms and so she stopped the medication.  Last colonoscopy was in 2013 by Dr. Vira Agar, extent of exam cecum, good prep reported, 2 subcentimeter polyps were removed and were hyperplastic.  Internal hemorrhoids were also reported  Past Medical History:  Diagnosis Date  . Arthritis   . Chronic kidney disease   . COPD (chronic obstructive pulmonary disease) ([place])    pt denies   . Dyspnea   . Family history of adverse reaction to anesthesia    father was slow to wake up  . Fuch's endothelial dystrophy   . GERD (gastroesophageal reflux disease)   .  Hyperlipidemia   . Hypertension   . Hypothyroidism   . Pneumonia    hx of   . PONV (postoperative nausea and vomiting)    slow to wake up and PONV    Past Surgical History:  Procedure Laterality Date  . ABDOMINAL HYSTERECTOMY    . APPENDECTOMY    . BACK SURGERY  09/26/2018   L4-5 PLIF by Dr. Arnoldo Morale  . CARDIAC CATHETERIZATION    . CHOLECYSTECTOMY    . EYE SURGERY    . FOOT SURGERY Right    pin removed left  . laser vein surgery    . NASAL SINUS SURGERY    . RIGHT/LEFT HEART CATH AND CORONARY ANGIOGRAPHY N/A 12/06/2017   Procedure: RIGHT/LEFT HEART CATH AND CORONARY ANGIOGRAPHY;  Surgeon: Yolonda Kida, MD;  Location: [facility];  Service: Cardiovascular;  Laterality: N/A;  . ROBOTIC ASSISTED LAPAROSCOPIC SACROCOLPOPEXY Bilateral 07/30/2020   Procedure: XI ROBOTIC ASSISTED LAPAROSCOPIC SACROCOLPOPEXY AND SUPRACERVICAL HYSTERECTOMY WITH BILATERAL SALPINGO OOPHERECTOMY;  Surgeon: Ardis Hughs, MD;  Location: [facility];  Service: Urology;  Laterality: Bilateral;  REQUESTING 4 HRS  . TONSILLECTOMY      Prior to Admission medications   Medication Sig Start Date End Date Taking? Authorizing Provider  acetaminophen (TYLENOL) 650 MG CR tablet Take 650-1,300 mg by mouth every 8 (eight) hours as needed for  pain.    Yes [provider]  azelastine (ASTELIN) 0.1 % nasal spray Place 1 spray into both nostrils daily. Use in each nostril as directed   Yes [provider]  D-Mannose POWD Take by mouth.   Yes [provider]  DHEA 10 MG TABS Take by mouth.   Yes [provider]  EPINEPHrine 0.3 mg/0.3 mL IJ SOAJ injection Inject 0.3 mg into the muscle as needed for anaphylaxis. 01/19/19  Yes [provider]  fexofenadine (ALLEGRA) 60 MG tablet Take 60 mg by mouth daily.   Yes [provider]  levothyroxine (SYNTHROID) 75 MCG tablet TAKE ONE TABLET EACH MORNING BEFORE BREAKFAST Patient taking differently: Take 75 mcg by mouth  daily before breakfast. TAKE ONE TABLET EACH MORNING BEFORE BREAKFAST 09/30/19  Yes Johnson, Megan P, DO  losartan (COZAAR) 100 MG tablet Take 1 tablet (100 mg total) by mouth daily. 04/07/20  Yes Johnson, Megan P, DO  Lutein 20 MG CAPS Take 1 capsule by mouth daily.   Yes [provider]  omeprazole (PRILOSEC) 40 MG capsule Take 1 capsule (40 mg total) by mouth daily. 10/09/20 11/08/20 Yes Estephany Perot, Margretta Sidle B, MD  psyllium (METAMUCIL) 28 % packet Take 1 packet by mouth daily. 10/09/20 11/08/20 Yes Virgel Manifold, MD  triamcinolone (NASACORT ALLERGY 24HR) 55 MCG/ACT AERO nasal inhaler Place 2 sprays daily into the nose. Patient taking differently: Place 2 sprays into the nose daily as needed (allergies). 03/28/17  Yes Crissman, Jeannette How, MD  chlorzoxazone (PARAFON) 500 MG tablet TAKE ONE TABLET FOUR TIMES DAILY AS NEEDED FOR MUSCLE SPASM 10/09/20   Charlynne Cousins, MD    Family History  Problem Relation Age of Onset  . Cancer Mother        lung  . CAD Mother   . Stroke Father   . Breast cancer Sister        25s  . Varicose Veins Son   . Heart disease Maternal Grandmother   . Cancer Paternal Grandmother        throat  . Emphysema Paternal Grandfather   . Fibromyalgia Sister   . Brain cancer Sister   . Breast cancer Other      Social History   Tobacco Use  . Smoking status: Never Smoker  . Smokeless tobacco: Never Used  Vaping Use  . Vaping Use: Never used  Substance Use Topics  . Alcohol use: No    Alcohol/week: 0.0 standard drinks  . Drug use: No    Allergies as of 10/09/2020 - Review Complete 10/09/2020  Allergen Reaction Noted  . Colesevelam Nausea And Vomiting 12/09/2016  . Levofloxacin Hives 06/12/2015  . Amoxicillin  07/14/2020  . Benazepril Other (See Comments) 06/04/2013  . Methylisothiazolinone Other (See Comments) 12/12/2017  . Thimerosal Other (See Comments) 12/12/2017  . Amoxicillin-pot clavulanate Nausea Only 11/27/2014  . Latex Rash 11/29/2017  .  Simvastatin Itching 11/26/2014    Review of Systems:    All systems reviewed and negative except where noted in HPI.   Physical Exam:  BP 124/66   Pulse 65   Wt 160 lb 12.8 oz (72.9 kg)   LMP  (LMP Unknown)   BMI 28.48 kg/m  No LMP recorded (lmp unknown). Patient is postmenopausal. Psych:  Alert and cooperative. Normal mood and affect. General:   Alert,  Well-developed, well-nourished, pleasant and cooperative in NAD Head:  Normocephalic and atraumatic. Eyes:  Sclera clear, no icterus.   Conjunctiva pink. Ears:  Normal auditory acuity. Nose:  No deformity, discharge, or lesions. Mouth:  No deformity or lesions,oropharynx pink & moist. Neck:  Supple; no masses or thyromegaly. Abdomen:  Normal bowel sounds.  No bruits.  Soft, non-tender and non-distended without masses, hepatosplenomegaly or hernias noted.  No guarding or rebound tenderness.    Msk:  Symmetrical without gross deformities. Good, equal movement & strength bilaterally. Pulses:  Normal pulses noted. Extremities:  No clubbing or edema.  No cyanosis. Neurologic:  Alert and oriented x3;  grossly normal neurologically. Skin:  Intact without significant lesions or rashes. No jaundice. Lymph Nodes:  No significant cervical adenopathy. Psych:  Alert and cooperative. Normal mood and affect.   Labs: CBC    Component Value Date/Time   WBC 9.2 09/19/2020 1549   WBC 8.3 07/24/2020 1359   RBC 3.75 (L) 09/19/2020 1549   RBC 4.15 07/24/2020 1359   HGB 12.0 09/19/2020 1549   HCT 35.3 09/19/2020 1549   PLT 218 09/19/2020 1549   MCV 94 09/19/2020 1549   MCH 32.0 09/19/2020 1549   MCH 31.1 07/24/2020 1359   MCHC 34.0 09/19/2020 1549   MCHC 32.7 07/24/2020 1359   RDW 12.4 09/19/2020 1549   LYMPHSABS 3.0 09/19/2020 1549   MONOABS 0.6 09/30/2018 0936   EOSABS 0.3 09/19/2020 1549   BASOSABS 0.1 09/19/2020 1549   CMP     Component Value Date/Time   NA 138 09/24/2020 1520   K 4.8 09/24/2020 1520   CL 102 09/24/2020 1520    CO2 19 (L) 09/24/2020 1520   GLUCOSE 84 09/24/2020 1520   GLUCOSE 142 (H) 07/31/2020 0341   BUN 29 (H) 09/24/2020 1520   CREATININE 1.12 (H) 09/24/2020 1520   CALCIUM 9.3 09/24/2020 1520   PROT 6.2 09/19/2020 1549   ALBUMIN 3.9 09/19/2020 1549   AST 16 09/19/2020 1549   AST 25 10/17/2017 0937   ALT 15 09/19/2020 1549   ALT 26 10/17/2017 0937   ALKPHOS 58 09/19/2020 1549   BILITOT 0.3 09/19/2020 1549   GFRNONAA 54 (L) 07/31/2020 0341   GFRAA 56 (L) 04/11/2020 0924    Imaging Studies: No results found.  Assessment and Plan:   Julia Deleon is a 75 y.o. y/o female has been referred for nausea and loose stools  Patient symptoms seem to have acutely started after she had an episode of presumed infectious gastroenteritis that lasted about 2 days.  She may have postinfectious IBS versus microscopic colitis that can also developed after an infectious diarrhea episode  Given her ongoing nausea, we will check H. pylori stool antigen given that the lab seems to have lost the sample that she submitted on 09/22/2020  We will also check for other infectious markers that were not done previously  Patient only took low-dose PPI for about a week.  Would recommend at least 2 to 4 weeks of therapy to see if symptoms get better.  Prilosec 40 mg once daily for 4 weeks and reassess symptoms  No alarm symptoms present at this time  Metamucil to help bulk stool  I did discuss colonoscopy to evaluate for microscopic colitis since it has been 9 years since her last colonoscopy and possible upper endoscopy for nausea given ongoing symptoms for at least 3 months.  However, patient would like conservative management at this time and would like to wait on endoscopic procedures at this time    Dr Julia Antigua  Speech recognition software was used to dictate the above note.

## 2020-10-09 NOTE — Telephone Encounter (Signed)
Patient states she is having Knee replacement surgery in October and does can not wait till July when her follow up appointment is to schedule a colonoscopy she states if she needs a colonoscopy she would like it schedule before her follow up appointment

## 2020-10-10 ENCOUNTER — Telehealth: Payer: Self-pay | Admitting: Gastroenterology

## 2020-10-10 DIAGNOSIS — M47816 Spondylosis without myelopathy or radiculopathy, lumbar region: Secondary | ICD-10-CM | POA: Diagnosis not present

## 2020-10-10 NOTE — Telephone Encounter (Signed)
Patient wants to know what foods should she avoid eating that causes nausea. Clinical staff will follow up with patient.

## 2020-10-11 LAB — H. PYLORI ANTIGEN, STOOL

## 2020-10-13 ENCOUNTER — Telehealth: Payer: Self-pay | Admitting: Gastroenterology

## 2020-10-13 ENCOUNTER — Ambulatory Visit: Payer: Medicare Other | Admitting: Family Medicine

## 2020-10-13 DIAGNOSIS — R197 Diarrhea, unspecified: Secondary | ICD-10-CM | POA: Diagnosis not present

## 2020-10-13 NOTE — Addendum Note (Signed)
Addended by: Lurlean Nanny on: 10/13/2020 04:13 PM   Modules accepted: Orders

## 2020-10-13 NOTE — Telephone Encounter (Signed)
Please see phone note from 10/10/2020, awaiting response from Dr Bonna Gains

## 2020-10-13 NOTE — Telephone Encounter (Signed)
Patient LVM for call back.

## 2020-10-13 NOTE — Telephone Encounter (Signed)
Patient came in to drop off stool samples, still waiting on call back.

## 2020-10-14 NOTE — Telephone Encounter (Signed)
Eating and drinking  Take an ORS (oral rehydration solution). This is a drink that is sold at pharmacies and stores.  Drink clear fluids in small amounts as you are able. These include: ? Water. ? Ice chips. ? Fruit juice that has water added (diluted fruit juice). ? Low-calorie sports drinks.  Eat bland, easy-to-digest foods in small amounts as you are able, such as: ? Bananas. ? Applesauce. ? Rice. ? Low-fat (lean) meats. ? Toast. ? Crackers.  Avoid drinking fluids that have a lot of sugar or caffeine in them. This includes energy drinks, sports drinks, and soda.

## 2020-10-14 NOTE — Patient Instructions (Signed)
Nausea, Adult °Nausea is feeling sick to your stomach or feeling that you are about to throw up (vomit). Feeling sick to your stomach is usually not serious, but it may be an early sign of a more serious medical problem. °As you feel sicker to your stomach, you may throw up. If you throw up, or if you are not able to drink enough fluids, there is a risk that you may lose too much water in your body (get dehydrated). If you lose too much water in your body, you may: °· Feel tired. °· Feel thirsty. °· Have a dry mouth. °· Have cracked lips. °· Go pee (urinate) less often. °Older adults and people who have other diseases or a weak body defense system (immune system) have a higher risk of losing too much water in the body. °The main goals of treating this condition are: °· To relieve your nausea. °· To ensure your nausea occurs less often. °· To prevent throwing up and losing too much fluid. °Follow these instructions at home: °Watch your symptoms for any changes. Tell your doctor about them. Follow these instructions as told by your doctor. °Eating and drinking °· Take an ORS (oral rehydration solution). This is a drink that is sold at pharmacies and stores. °· Drink clear fluids in small amounts as you are able. These include: °? Water. °? Ice chips. °? Fruit juice that has water added (diluted fruit juice). °? Low-calorie sports drinks. °· Eat bland, easy-to-digest foods in small amounts as you are able, such as: °? Bananas. °? Applesauce. °? Rice. °? Low-fat (lean) meats. °? Toast. °? Crackers. °· Avoid drinking fluids that have a lot of sugar or caffeine in them. This includes energy drinks, sports drinks, and soda. °· Avoid alcohol. °· Avoid spicy or fatty foods.  °  °  °General instructions °· Take over-the-counter and prescription medicines only as told by your doctor. °· Rest at home while you get better. °· Drink enough fluid to keep your pee (urine) pale yellow. °· Take slow and deep breaths when you feel  sick to your stomach. °· Avoid food or things that have strong smells. °· Wash your hands often with soap and water. If you cannot use soap and water, use hand sanitizer. °· Make sure that all people in your home wash their hands well and often. °· Keep all follow-up visits as told by your doctor. This is important. °Contact a doctor if: °· You feel sicker to your stomach. °· You feel sick to your stomach for more than 2 days. °· You throw up. °· You are not able to drink fluids without throwing up. °· You have new symptoms. °· You have a fever. °· You have a headache. °· You have muscle cramps. °· You have a rash. °· You have pain while peeing. °· You feel light-headed or dizzy. °Get help right away if: °· You have pain in your chest, neck, arm, or jaw. °· You feel very weak or you pass out (faint). °· You have throw up that is bright red or looks like coffee grounds. °· You have bloody or black poop (stools) or poop that looks like tar. °· You have a very bad headache, a stiff neck, or both. °· You have very bad pain, cramping, or bloating in your belly (abdomen). °· You have trouble breathing or you are breathing very quickly. °· Your heart is beating very quickly. °· Your skin feels cold and clammy. °· You feel confused. °·   You have signs of losing too much water in your body, such as: °? Dark pee, very little pee, or no pee. °? Cracked lips. °? Dry mouth. °? Sunken eyes. °? Sleepiness. °? Weakness. °These symptoms may be an emergency. Do not wait to see if the symptoms will go away. Get medical help right away. Call your local emergency services (911 in the [place] Do not drive yourself to the hospital. °Summary °· Nausea is feeling sick to your stomach or feeling that you are about to throw up (vomit). °· If you throw up, or if you are not able to drink enough fluids, there is a risk that you may lose too much water in your body (get dehydrated). °· Eat and drink what your doctor tells you. Take  over-the-counter and prescription medicines only as told by your doctor. °· Contact a doctor right away if your symptoms get worse or you have new symptoms. °· Keep all follow-up visits as told by your doctor. This is important. °This information is not intended to replace advice given to you by your health care provider. Make sure you discuss any questions you have with your health care provider. °Document Revised: 03/27/2019 Document Reviewed: 10/04/2017 °Elsevier Patient Education © 2021 [employer] ° °

## 2020-10-15 ENCOUNTER — Telehealth: Payer: Self-pay | Admitting: Gastroenterology

## 2020-10-15 NOTE — Telephone Encounter (Signed)
This is the 3rd encounter please refer to previous... msg sent via mychart and pt read info and what to eat with experiencing nausea  msg was sent via mychart   Last read by Damaris C Abeyta at 11:53 AM on 10/15/2020

## 2020-10-15 NOTE — Telephone Encounter (Signed)
msg was sent via mychart   Last read by Gaylon C Gruen at 11:53 AM on 10/15/2020

## 2020-10-15 NOTE — Telephone Encounter (Signed)
Please call to answer questions about side effects.

## 2020-10-16 ENCOUNTER — Other Ambulatory Visit: Payer: Self-pay

## 2020-10-16 DIAGNOSIS — R109 Unspecified abdominal pain: Secondary | ICD-10-CM

## 2020-10-16 DIAGNOSIS — R110 Nausea: Secondary | ICD-10-CM

## 2020-10-16 LAB — GI PROFILE, STOOL, PCR

## 2020-10-16 LAB — H. PYLORI ANTIGEN, STOOL: H pylori Ag, Stl: NEGATIVE

## 2020-10-16 LAB — PANCREATIC ELASTASE, FECAL: Pancreatic Elastase, Fecal: 459 ug Elast./g (ref >200)

## 2020-10-16 MED ORDER — NA SULFATE-K SULFATE-MG SULF 17.5-3.13-1.6 GM/177ML PO SOLN: 1.0000 | Freq: Once | ORAL | 0 refills | Status: AC

## 2020-10-16 NOTE — Telephone Encounter (Signed)
Pt has been scheduled for CT with contrast for [place] 22 at 11am.... Pt would like to know if there is anything that she do or take in the meantime...Marland Kitchen please advise

## 2020-10-22 DIAGNOSIS — M1711 Unilateral primary osteoarthritis, right knee: Secondary | ICD-10-CM | POA: Diagnosis not present

## 2020-10-22 DIAGNOSIS — I700 Atherosclerosis of aorta: Secondary | ICD-10-CM | POA: Diagnosis not present

## 2020-10-23 ENCOUNTER — Encounter: Payer: Self-pay | Admitting: Gastroenterology

## 2020-10-23 ENCOUNTER — Other Ambulatory Visit: Payer: Self-pay

## 2020-10-23 ENCOUNTER — Ambulatory Visit
Admission: RE | Admit: 2020-10-23 | Discharge: 2020-10-23 | Disposition: A | Discharge status: Home / Self Care | Payer: Medicare Other | Attending: Gastroenterology | Admitting: Gastroenterology

## 2020-10-23 ENCOUNTER — Ambulatory Visit: Payer: Medicare Other | Admitting: Anesthesiology

## 2020-10-23 ENCOUNTER — Encounter
Admission: RE | Disposition: A | Discharge status: Home / Self Care | Payer: Self-pay | Source: Home / Self Care | Attending: Gastroenterology

## 2020-10-23 DIAGNOSIS — Z888 Allergy status to other drugs, medicaments and biological substances status: Secondary | ICD-10-CM | POA: Diagnosis not present

## 2020-10-23 DIAGNOSIS — Z1211 Encounter for screening for malignant neoplasm of colon: Secondary | ICD-10-CM | POA: Diagnosis not present

## 2020-10-23 DIAGNOSIS — R1115 Cyclical vomiting syndrome unrelated to migraine: Secondary | ICD-10-CM | POA: Insufficient documentation

## 2020-10-23 DIAGNOSIS — Z9049 Acquired absence of other specified parts of digestive tract: Secondary | ICD-10-CM | POA: Diagnosis not present

## 2020-10-23 DIAGNOSIS — K635 Polyp of colon: Secondary | ICD-10-CM | POA: Diagnosis not present

## 2020-10-23 DIAGNOSIS — K648 Other hemorrhoids: Secondary | ICD-10-CM | POA: Diagnosis not present

## 2020-10-23 DIAGNOSIS — R1310 Dysphagia, unspecified: Secondary | ICD-10-CM | POA: Insufficient documentation

## 2020-10-23 DIAGNOSIS — Z79890 Hormone replacement therapy (postmenopausal): Secondary | ICD-10-CM | POA: Insufficient documentation

## 2020-10-23 DIAGNOSIS — K449 Diaphragmatic hernia without obstruction or gangrene: Secondary | ICD-10-CM | POA: Diagnosis not present

## 2020-10-23 DIAGNOSIS — Z881 Allergy status to other antibiotic agents status: Secondary | ICD-10-CM | POA: Diagnosis not present

## 2020-10-23 DIAGNOSIS — Z79899 Other long term (current) drug therapy: Secondary | ICD-10-CM | POA: Insufficient documentation

## 2020-10-23 DIAGNOSIS — Z880 Allergy status to penicillin: Secondary | ICD-10-CM | POA: Diagnosis not present

## 2020-10-23 DIAGNOSIS — M6289 Other specified disorders of muscle: Secondary | ICD-10-CM

## 2020-10-23 DIAGNOSIS — Z91040 Latex allergy status: Secondary | ICD-10-CM | POA: Diagnosis not present

## 2020-10-23 DIAGNOSIS — R110 Nausea: Secondary | ICD-10-CM | POA: Diagnosis not present

## 2020-10-23 DIAGNOSIS — R109 Unspecified abdominal pain: Secondary | ICD-10-CM | POA: Diagnosis not present

## 2020-10-23 DIAGNOSIS — D125 Benign neoplasm of sigmoid colon: Secondary | ICD-10-CM | POA: Diagnosis not present

## 2020-10-23 DIAGNOSIS — Z90710 Acquired absence of both cervix and uterus: Secondary | ICD-10-CM | POA: Diagnosis not present

## 2020-10-23 DIAGNOSIS — K5730 Diverticulosis of large intestine without perforation or abscess without bleeding: Secondary | ICD-10-CM | POA: Insufficient documentation

## 2020-10-23 DIAGNOSIS — K222 Esophageal obstruction: Secondary | ICD-10-CM | POA: Insufficient documentation

## 2020-10-23 DIAGNOSIS — R1084 Generalized abdominal pain: Secondary | ICD-10-CM | POA: Diagnosis not present

## 2020-10-23 DIAGNOSIS — K52839 Microscopic colitis, unspecified: Secondary | ICD-10-CM

## 2020-10-23 HISTORY — PX: COLONOSCOPY WITH PROPOFOL: SHX5780

## 2020-10-23 HISTORY — PX: ESOPHAGOGASTRODUODENOSCOPY: SHX5428

## 2020-10-23 SURGERY — COLONOSCOPY WITH PROPOFOL
Anesthesia: General

## 2020-10-23 MED ORDER — EPHEDRINE 5 MG/ML INJ
INTRAVENOUS | Status: AC
  Filled 2020-10-23: qty 10

## 2020-10-23 MED ORDER — PROPOFOL 500 MG/50ML IV EMUL
INTRAVENOUS | Status: DC | PRN
  Administered 2020-10-23: 120 ug/kg/min via INTRAVENOUS

## 2020-10-23 MED ORDER — ONDANSETRON HCL 4 MG/2ML IJ SOLN
INTRAMUSCULAR | Status: DC | PRN
  Administered 2020-10-23: 4 mg via INTRAVENOUS

## 2020-10-23 MED ORDER — EPHEDRINE SULFATE 50 MG/ML IJ SOLN
INTRAMUSCULAR | Status: DC | PRN
  Administered 2020-10-23: 10 mg via INTRAVENOUS

## 2020-10-23 MED ORDER — LIDOCAINE HCL (CARDIAC) PF 100 MG/5ML IV SOSY
PREFILLED_SYRINGE | INTRAVENOUS | Status: DC | PRN
  Administered 2020-10-23: 40 mg via INTRAVENOUS

## 2020-10-23 MED ORDER — SODIUM CHLORIDE 0.9 % IV SOLN: INTRAVENOUS | Status: DC

## 2020-10-23 MED ORDER — PROPOFOL 10 MG/ML IV BOLUS
INTRAVENOUS | Status: AC
  Filled 2020-10-23: qty 20

## 2020-10-23 MED ORDER — LIDOCAINE HCL (PF) 2 % IJ SOLN
INTRAMUSCULAR | Status: AC
  Filled 2020-10-23: qty 2

## 2020-10-23 MED ORDER — PROPOFOL 500 MG/50ML IV EMUL
INTRAVENOUS | Status: AC
  Filled 2020-10-23: qty 50

## 2020-10-23 NOTE — Transfer of Care (Signed)
Immediate Anesthesia Transfer of Care Note  Patient: Julia Deleon  Procedure(s) Performed: COLONOSCOPY WITH PROPOFOL ESOPHAGOGASTRODUODENOSCOPY (EGD)  Patient Location: PACU  Anesthesia Type:General  Level of Consciousness: awake and alert   Airway & Oxygen Therapy: Patient Spontanous Breathing  Post-op Assessment: Report given to RN and Post -op Vital signs reviewed and stable  Post vital signs: Reviewed and stable  Last Vitals:  Vitals Value Taken Time  BP 103/50 10/23/20 1239  Temp 36.1 C 10/23/20 1239  Pulse 64 10/23/20 1240  Resp 15 10/23/20 1240  SpO2 100 % 10/23/20 1240  Vitals shown include unvalidated device data.  Last Pain:  Vitals:   10/23/20 1239  TempSrc: Temporal  PainSc: 0-No pain         Complications: No notable events documented.

## 2020-10-23 NOTE — Op Note (Addendum)
[facility] Gastroenterology Patient Name: Julia Deleon Procedure Date: 10/23/2020 11:29 AM MRN: [id number] Account #: [id number] Date of Birth: [birth year] Admit Type: Outpatient Age: 75 Room: [facility] ENDO ROOM 2 Gender: Female Note Status: Finalized Procedure:             Upper GI endoscopy Indications:           Dysphagia, Nausea, Pt has had persistent nausea                         affecting her appetite and quality of life with no                         relief in symptoms with PPI. Upon speaking with the                         patient further today and she states over the last 2                         years she feels that food like meats or bread may be a                         little difficult to swallow. Providers:             Lennette Bihari. Bonna Gains MD, MD Referring MD:          Forest Gleason Md, MD (Referring MD) Medicines:             Monitored Anesthesia Care Complications:         No immediate complications. Procedure:             Pre-Anesthesia Assessment:                        - Prior to the procedure, a History and Physical was                         performed, and patient medications, allergies and                         sensitivities were reviewed. The patient's tolerance                         of previous anesthesia was reviewed.                        - The risks and benefits of the procedure and the                         sedation options and risks were discussed with the                         patient. All questions were answered and informed                         consent was obtained.                        - Patient identification and proposed procedure were  verified prior to the procedure by the physician, the                         nurse, the anesthesiologist, the anesthetist and the                         technician. The procedure was verified in the                         procedure room.                        -  ASA Grade Assessment: II - A patient with mild                         systemic disease.                        After obtaining informed consent, the endoscope was                         passed under direct vision. Throughout the procedure,                         the patient's blood pressure, pulse, and oxygen                         saturations were monitored continuously. The Endoscope                         was introduced through the mouth, and advanced to the                         second part of duodenum. The upper GI endoscopy was                         accomplished with ease. The patient tolerated the                         procedure well. Findings:      A mild Schatzki ring was found at the gastroesophageal junction. A TTS       dilator was passed through the scope. Dilation with an 18-19-20 mm       balloon dilator was performed to 20 mm. Mild resistance was noted on       passing the scope through the area of the schatzki ring that improved       after dilation to 64mm. No mucosal tears or heme effect was noted after       dilation. Due to patient's nausea and dysphagia and the resistance to       passing the scope noted on exam, it was considered appropriate to dilate       the area as part of her frequent nausea (affecting appetite and quality       of life) may be coming from some resistance to food passing through this       area as well. Sequential dilation was done from 18 to 19 to 44mm with       holding the balloon in place for 1 min each time  and re-examining the       area for heme effect each time prior to increasing the ballon size.      The exam of the esophagus was otherwise normal.      A small hiatal hernia was present.      The entire examined stomach was normal. Biopsies were obtained in the       gastric body, at the incisura and in the gastric antrum with cold       forceps for histology. Biopsies were taken with a cold forceps for       Helicobacter  pylori testing.      The exam of the stomach was otherwise normal.      The duodenal bulb, second portion of the duodenum and examined duodenum       were normal. Biopsies for histology were taken with a cold forceps for       evaluation of celiac disease. Impression:            - Mild Schatzki ring. Dilated.                        - Small hiatal hernia.                        - Normal stomach. Biopsied.                        - Normal duodenal bulb, second portion of the duodenum                         and examined duodenum. Biopsied.                        - Biopsies were obtained in the gastric body, at the                         incisura and in the gastric antrum. Recommendation:        - Await pathology results.                        - Follow an antireflux regimen.                        - Consider referral to general surgery for evaluation                         of hiatal hernia repair if symptoms continue                        - Take prescribed proton pump inhibitor or H2 blocker                         (antacid) medications 30 - 60 minutes before meals.                        - Discharge patient to home (with escort).                        - Advance diet as tolerated.                        -  Continue present medications.                        - Patient has a contact number available for                         emergencies. The signs and symptoms of potential                         delayed complications were discussed with the patient.                         Return to normal activities tomorrow. Written                         discharge instructions were provided to the patient.                        - Discharge patient to home (with escort).                        - The findings and recommendations were discussed with                         the patient.                        - The findings and recommendations were discussed with                         the patient's  family. Procedure Code(s):     --- Professional ---                        [phone], Esophagogastroduodenoscopy, flexible,                         transoral; with transendoscopic balloon dilation of                         esophagus (less than 30 mm diameter)                        43239, 59, Esophagogastroduodenoscopy, flexible,                         transoral; with biopsy, single or multiple Diagnosis Code(s):     --- Professional ---                        K22.2, Esophageal obstruction                        K44.9, Diaphragmatic hernia without obstruction or                         gangrene                        R11.0, Nausea                        L97.67, Cyclical vomiting syndrome unrelated to  migraine CPT copyright 2019 American Medical Association. All rights reserved. The codes documented in this report are preliminary and upon coder review may  be revised to meet current compliance requirements.  Vonda Antigua, MD Margretta Sidle B. Bonna Gains MD, MD 10/23/2020 12:44:33 PM This report has been signed electronically. Number of Addenda: 0 Note Initiated On: 10/23/2020 11:29 AM Estimated Blood Loss:  Estimated blood loss: none.      [facility]

## 2020-10-23 NOTE — Anesthesia Preprocedure Evaluation (Signed)
Anesthesia Evaluation  Patient identified by MRN, date of birth, ID band Patient awake    Reviewed: Allergy & Precautions, NPO status , Patient's Chart, lab work & pertinent test results  History of Anesthesia Complications (+) PONV, Family history of anesthesia reaction and history of anesthetic complications  Airway Mallampati: II  TM Distance: >3 FB Neck ROM: Full    Dental no notable dental hx.    Pulmonary shortness of breath and with exertion, pneumonia, resolved, COPD,    Pulmonary exam normal        Cardiovascular hypertension, Pt. on medications + CAD  Normal cardiovascular exam     Neuro/Psych  Headaches, negative psych ROS   GI/Hepatic Neg liver ROS, GERD  Medicated and Controlled,  Endo/Other  Hypothyroidism   Renal/GU Renal InsufficiencyRenal disease  negative genitourinary   Musculoskeletal  (+) Arthritis ,   Abdominal   Peds  Hematology negative hematology ROS (+)   Anesthesia Other Findings Arthritis    Chronic kidney disease   COPD (chronic obstructive pulmonary  Dyspnea    Family history of adverse reaction to anesthesia  father was slow to wake up  Fuch's endothelial dystrophy   GERD  Hyperlipidemia    Hypertension    Hypothyroidism    Pneumonia  hx of   PONV (postoperative nausea and vomiting)       Reproductive/Obstetrics negative OB ROS                             Anesthesia Physical  Anesthesia Plan  ASA: 3  Anesthesia Plan: General   Post-op Pain Management:    Induction: Intravenous  PONV Risk Score and Plan: 4 or greater and Propofol infusion and TIVA  Airway Management Planned: Natural Airway and Nasal Cannula  Additional Equipment:   Intra-op Plan:   Post-operative Plan: Extubation in OR  Informed Consent: I have reviewed the patients History and Physical, chart, labs and discussed the procedure including the risks, benefits and  alternatives for the proposed anesthesia with the patient or authorized representative who has indicated his/her understanding and acceptance.     Dental advisory given  Plan Discussed with: CRNA, Anesthesiologist and Surgeon  Anesthesia Plan Comments:         Anesthesia Quick Evaluation

## 2020-10-23 NOTE — Op Note (Signed)
[facility] Patient Name: Julia Deleon Procedure Date: 10/23/2020 11:27 AM MRN: [id number] Account #: [id number] Date of Birth: 08-Nov-1945 Admit Type: Outpatient Age: 75 Room: [facility] ENDO ROOM 2 Gender: Female Note Status: Finalized Procedure:             Colonoscopy Indications:           Screening for colorectal malignant neoplasm Providers:             Jaron Czarnecki B. Bonna Gains MD, MD Referring MD:          Forest Gleason Md, MD (Referring MD) Medicines:             Monitored Anesthesia Care Complications:         No immediate complications. Procedure:             Pre-Anesthesia Assessment:                        - ASA Grade Assessment: II - A patient with mild                         systemic disease.                        - Prior to the procedure, a History and Physical was                         performed, and patient medications, allergies and                         sensitivities were reviewed. The patient's tolerance                         of previous anesthesia was reviewed.                        - The risks and benefits of the procedure and the                         sedation options and risks were discussed with the                         patient. All questions were answered and informed                         consent was obtained.                        - Patient identification and proposed procedure were                         verified prior to the procedure by the physician, the                         nurse, the anesthesiologist, the anesthetist and the                         technician. The procedure was verified in the                         procedure  room.                        After obtaining informed consent, the colonoscope was                         passed under direct vision. Throughout the procedure,                         the patient's blood pressure, pulse, and oxygen                         saturations were monitored  continuously. The                         Colonoscope was introduced through the anus and                         advanced to the the cecum, identified by appendiceal                         orifice and ileocecal valve. The colonoscopy was                         performed with ease. The patient tolerated the                         procedure well. The quality of the bowel preparation                         was good. Findings:      The digital rectal exam findings include decreased sphincter tone.      A 4 mm polyp was found in the cecum. The polyp was sessile. The polyp       was removed with a cold biopsy forceps. Resection and retrieval were       complete.      A 12 mm polyp was found in the sigmoid colon. The polyp was flat. The       polyp was removed with a piecemeal technique using a cold snare.       Resection and retrieval were complete.      A 10 mm polyp was found in the sigmoid colon. The polyp was sessile. The       polyp was removed with a cold snare. Resection and retrieval were       complete.      Multiple diverticula were found in the sigmoid colon.      The exam was otherwise without abnormality.      The rectum, sigmoid colon, descending colon, transverse colon, ascending       colon and cecum appeared normal. Biopsies for histology were taken with       a cold forceps for evaluation of microscopic colitis.      Non-bleeding internal hemorrhoids were found during retroflexion. Impression:            - Decreased sphincter tone found on digital rectal                         exam.                        -  One 4 mm polyp in the cecum, removed with a cold                         biopsy forceps. Resected and retrieved.                        - One 12 mm polyp in the sigmoid colon, removed                         piecemeal using a cold snare. Resected and retrieved.                        - One 10 mm polyp in the sigmoid colon, removed with a                          cold snare. Resected and retrieved.                        - Diverticulosis in the sigmoid colon.                        - The examination was otherwise normal.                        - The rectum, sigmoid colon, descending colon,                         transverse colon, ascending colon and cecum are                         normal. Biopsied.                        - Non-bleeding internal hemorrhoids. Recommendation:        - Discharge patient to home (with escort).                        - Decreased sphincter tone may be contributing to her                         diarrhea. Refer for pelvic floor physical therapy if                         patient agreeable.                        - High fiber diet.                        - Advance diet as tolerated.                        - Continue present medications.                        - Await pathology results.                        - Repeat colonoscopy date to be determined after  pending pathology results are reviewed.                        - The findings and recommendations were discussed with                         the patient.                        - The findings and recommendations were discussed with                         the patient's family.                        - Return to primary care physician as previously                         scheduled. Procedure Code(s):     --- Professional ---                        [phone], Colonoscopy, flexible; with removal of                         tumor(s), polyp(s), or other lesion(s) by snare                         technique                        45380, 83, Colonoscopy, flexible; with biopsy, single                         or multiple Diagnosis Code(s):     --- Professional ---                        Z12.11, Encounter for screening for malignant neoplasm                         of colon                        K63.5, Polyp of colon CPT copyright 2019 American Medical  Association. All rights reserved. The codes documented in this report are preliminary and upon coder review may  be revised to meet current compliance requirements.  Vonda Antigua, MD Margretta Sidle B. Bonna Gains MD, MD 10/23/2020 12:38:54 PM This report has been signed electronically. Number of Addenda: 0 Note Initiated On: 10/23/2020 11:27 AM Scope Withdrawal Time: 0 hours 28 minutes 23 seconds  Total Procedure Duration: 0 hours 35 minutes 45 seconds  Estimated Blood Loss:  Estimated blood loss: none.      [facility]

## 2020-10-23 NOTE — Anesthesia Postprocedure Evaluation (Signed)
Anesthesia Post Note  Patient: Julia Deleon  Procedure(s) Performed: COLONOSCOPY WITH PROPOFOL ESOPHAGOGASTRODUODENOSCOPY (EGD)  Patient location during evaluation: Phase II Anesthesia Type: General Level of consciousness: awake and alert, awake and oriented Pain management: pain level controlled Vital Signs Assessment: post-procedure vital signs reviewed and stable Respiratory status: spontaneous breathing, nonlabored ventilation and respiratory function stable Cardiovascular status: blood pressure returned to baseline and stable Postop Assessment: no apparent nausea or vomiting Anesthetic complications: no   No notable events documented.   Last Vitals:  Vitals:   10/23/20 1259 10/23/20 1309  BP: (!) 121/56 (!) 132/59  Pulse: 65 65  Resp: 15 16  Temp:    SpO2: 100% 98%    Last Pain:  Vitals:   10/23/20 1259  TempSrc:   PainSc: 0-No pain                 Phill Mutter

## 2020-10-23 NOTE — H&P (Signed)
Vonda Antigua, MD [address], [place], [place], [place], [address] [address], [place], [place], [place], [address] Phone: [phone]  Fax: [phone]  Primary Care Physician:  Charlynne Cousins, MD   Pre-Procedure History & Physical: HPI:  Julia Deleon is a 75 y.o. female is here for a colonoscopy and EGD.   Past Medical History:  Diagnosis Date   Arthritis    Chronic kidney disease    COPD (chronic obstructive pulmonary disease) ([place])    pt denies    Dyspnea    Family history of adverse reaction to anesthesia    father was slow to wake up   Fuch's endothelial dystrophy    GERD (gastroesophageal reflux disease)    Hyperlipidemia    Hypertension    Hypothyroidism    Pneumonia    hx of    PONV (postoperative nausea and vomiting)    slow to wake up and PONV    Past Surgical History:  Procedure Laterality Date   ABDOMINAL HYSTERECTOMY     APPENDECTOMY     BACK SURGERY  09/26/2018   L4-5 PLIF by Dr. Arnoldo Morale   CARDIAC CATHETERIZATION     CHOLECYSTECTOMY     EYE SURGERY     FOOT SURGERY Right    pin removed left   laser vein surgery     NASAL SINUS SURGERY     RIGHT/LEFT HEART CATH AND CORONARY ANGIOGRAPHY N/A 12/06/2017   Procedure: RIGHT/LEFT HEART CATH AND CORONARY ANGIOGRAPHY;  Surgeon: Yolonda Kida, MD;  Location: [facility];  Service: Cardiovascular;  Laterality: N/A;   ROBOTIC ASSISTED LAPAROSCOPIC SACROCOLPOPEXY Bilateral 07/30/2020   Procedure: XI ROBOTIC ASSISTED LAPAROSCOPIC SACROCOLPOPEXY AND SUPRACERVICAL HYSTERECTOMY WITH BILATERAL SALPINGO OOPHERECTOMY;  Surgeon: Ardis Hughs, MD;  Location: [facility];  Service: Urology;  Laterality: Bilateral;  REQUESTING 4 HRS   TONSILLECTOMY      Prior to Admission medications   Medication Sig Start Date End Date Taking? Authorizing Provider  acetaminophen (TYLENOL) 650 MG CR tablet Take 650-1,300 mg by mouth every 8 (eight) hours as needed for pain.    Yes [provider]   azelastine (ASTELIN) 0.1 % nasal spray Place 1 spray into both nostrils daily. Use in each nostril as directed   Yes [provider]  fexofenadine (ALLEGRA) 60 MG tablet Take 60 mg by mouth daily.   Yes [provider]  levothyroxine (SYNTHROID) 75 MCG tablet TAKE ONE TABLET EACH MORNING BEFORE BREAKFAST Patient taking differently: Take 75 mcg by mouth daily before breakfast. TAKE ONE TABLET EACH MORNING BEFORE BREAKFAST 09/30/19  Yes Johnson, Megan P, DO  losartan (COZAAR) 100 MG tablet Take 1 tablet (100 mg total) by mouth daily. 04/07/20  Yes Johnson, Megan P, DO  Lutein 20 MG CAPS Take 1 capsule by mouth daily.   Yes [provider]  omeprazole (PRILOSEC) 40 MG capsule Take 1 capsule (40 mg total) by mouth daily. 10/09/20 11/08/20 Yes Virgel Manifold, MD  triamcinolone (NASACORT ALLERGY 24HR) 55 MCG/ACT AERO nasal inhaler Place 2 sprays daily into the nose. Patient taking differently: Place 2 sprays into the nose daily as needed (allergies). 03/28/17  Yes Crissman, Jeannette How, MD  chlorzoxazone (PARAFON) 500 MG tablet TAKE ONE TABLET FOUR TIMES DAILY AS NEEDED FOR MUSCLE SPASM 10/09/20   Charlynne Cousins, MD  D-Mannose POWD Take by mouth. Patient not taking: Reported on 10/23/2020    [provider]  DHEA 10 MG TABS Take by mouth.    [provider]  EPINEPHrine 0.3 mg/0.3 mL IJ SOAJ injection Inject 0.3 mg into the muscle as needed for anaphylaxis. Patient not taking: Reported on 10/23/2020 01/19/19   [provider]  psyllium (METAMUCIL) 28 % packet Take 1 packet by mouth daily. 10/09/20 11/08/20  Virgel Manifold, MD    Allergies as of 10/17/2020 - Review Complete 10/09/2020  Allergen Reaction Noted   Colesevelam Nausea And Vomiting 12/09/2016   Levofloxacin Hives 06/12/2015   Amoxicillin  07/14/2020   Benazepril Other (See Comments) 06/04/2013   Methylisothiazolinone Other (See Comments) 12/12/2017   Thimerosal Other (See Comments)  12/12/2017   Amoxicillin-pot clavulanate Nausea Only 11/27/2014   Latex Rash 11/29/2017   Simvastatin Itching 11/26/2014    Family History  Problem Relation Age of Onset   Cancer Mother        lung   CAD Mother    Stroke Father    Breast cancer Sister        37s   Varicose Veins Son    Heart disease Maternal Grandmother    Cancer Paternal Grandmother        throat   Emphysema Paternal Grandfather    Fibromyalgia Sister    Brain cancer Sister    Breast cancer Other     Social History   Socioeconomic History   Marital status: Married    Spouse name: Not on file   Number of children: Not on file   Years of education: 4   Highest education level: Bachelor's degree (e.g., BA, AB, BS)  Occupational History   Occupation: retired  Tobacco Use   Smoking status: Never   Smokeless tobacco: Never  Vaping Use   Vaping Use: Never used  Substance and Sexual Activity   Alcohol use: No    Alcohol/week: 0.0 standard drinks   Drug use: No   Sexual activity: Not Currently    Birth control/protection: Post-menopausal  Other Topics Concern   Not on file  Social History Narrative   Not on file   Social Determinants of Health   Financial Resource Strain: Low Risk    Difficulty of Paying Living Expenses: Not hard at all  Food Insecurity: No Food Insecurity   Worried About Charity fundraiser in the Last Year: Never true   [place] in the Last Year: Never true  Transportation Needs: No Transportation Needs   Lack of Transportation (Medical): No   Lack of Transportation (Non-Medical): No  Physical Activity: Insufficiently Active   Days of Exercise per Week: 3 days   Minutes of Exercise per Session: 40 min  Stress: No Stress Concern Present   Feeling of Stress : Not at all  Social Connections: Not on file  Intimate Partner Violence: Not on file    Review of Systems: See HPI, otherwise negative ROS  Physical Exam: BP 122/76   Pulse 79   Temp (!) 97.4 F (36.3 C)  (Temporal)   Resp 16   Ht 5\' 4"  (1.626 m)   Wt 68 kg   LMP  (LMP Unknown)   SpO2 99%   BMI 25.75 kg/m  General:   Alert,  pleasant and cooperative in NAD Head:  Normocephalic and atraumatic. Neck:  Supple; no masses or thyromegaly. Lungs:  Clear throughout to auscultation, normal respiratory effort.    Heart:  +S1, +S2, Regular rate and rhythm, No edema. Abdomen:  Soft, nontender and nondistended. Normal bowel sounds, without guarding, and without rebound.   Neurologic:  Alert and  oriented x4;  grossly normal neurologically.  Impression/Plan: Julia Deleon is here for a colonoscopy to be performed for average risk screening and EGD for acid reflux, nausea.  Risks, benefits, limitations, and alternatives regarding the procedures have been reviewed with the patient.  Questions have been answered.  All parties agreeable.   Virgel Manifold, MD  10/23/2020, 11:21 AM

## 2020-10-24 ENCOUNTER — Encounter: Payer: Self-pay | Admitting: Gastroenterology

## 2020-10-24 NOTE — Telephone Encounter (Signed)
Faxed referral to [facility] surgery faxed colonoscopy and EGD report, Last office visit notes, Insurance and demographics. Also did referral to pelvic floor PT

## 2020-10-27 LAB — SURGICAL PATHOLOGY

## 2020-10-28 ENCOUNTER — Telehealth: Payer: Self-pay

## 2020-10-28 ENCOUNTER — Encounter: Payer: Self-pay | Admitting: Gastroenterology

## 2020-10-28 DIAGNOSIS — M47816 Spondylosis without myelopathy or radiculopathy, lumbar region: Secondary | ICD-10-CM | POA: Diagnosis not present

## 2020-10-28 NOTE — Telephone Encounter (Signed)
Per VO from Dr Bonna Gains, pt should proceed with CT scan at this time due to her pathology coming back negative   Pt is aware   Also   Shelby Mattocks, CMA   AH   10:59 AM  Note Faxed referral 10/24/2020 to [facility] surgery faxed colonoscopy and EGD report, Last office visit notes, Insurance and demographics. Also did referral to pelvic floor PT

## 2020-10-29 ENCOUNTER — Ambulatory Visit: Admission: RE | Admit: 2020-10-29 | Payer: Medicare Other | Source: Ambulatory Visit

## 2020-10-29 ENCOUNTER — Other Ambulatory Visit: Payer: Self-pay

## 2020-10-29 ENCOUNTER — Ambulatory Visit
Admission: RE | Admit: 2020-10-29 | Discharge: 2020-10-29 | Disposition: A | Discharge status: Home / Self Care | Payer: Medicare Other | Source: Ambulatory Visit | Attending: Gastroenterology | Admitting: Gastroenterology

## 2020-10-29 DIAGNOSIS — R109 Unspecified abdominal pain: Secondary | ICD-10-CM | POA: Diagnosis not present

## 2020-10-29 DIAGNOSIS — R110 Nausea: Secondary | ICD-10-CM

## 2020-10-29 LAB — POCT I-STAT CREATININE: Creatinine, Ser: 1.3 mg/dL — ABNORMAL HIGH (ref 0.44–1.00)

## 2020-10-29 MED ORDER — IOHEXOL 300 MG/ML  SOLN
100.0000 mL | Freq: Once | INTRAMUSCULAR | Status: AC | PRN
  Administered 2020-10-29: 85 mL via INTRAVENOUS

## 2020-10-30 ENCOUNTER — Other Ambulatory Visit: Payer: Self-pay | Admitting: Gastroenterology

## 2020-10-30 ENCOUNTER — Other Ambulatory Visit: Payer: Self-pay

## 2020-10-30 DIAGNOSIS — R1013 Epigastric pain: Secondary | ICD-10-CM

## 2020-10-30 MED ORDER — COLESTIPOL HCL 1 G PO TABS: 2.0000 g | ORAL_TABLET | Freq: Every day | ORAL | 0 refills | Status: DC

## 2020-10-30 MED ORDER — DICYCLOMINE HCL 10 MG PO CAPS: 10.0000 mg | ORAL_CAPSULE | Freq: Two times a day (BID) | ORAL | 0 refills | Status: DC | PRN

## 2020-10-30 NOTE — Progress Notes (Signed)
Please let pt know that we will be referring her to nephrology for abnormalities noted on Ct abdomen / elevated creatinine. Thank you

## 2020-10-30 NOTE — Progress Notes (Signed)
Can we please refer this pt to nephrology asap thnx.

## 2020-10-30 NOTE — Telephone Encounter (Signed)
[facility]  to  cancel the prescription for Dicyclomine at the pharmacy.  She states that the patient has already pick up the medication.  Called patient and she verbalized understanding of results. She states she is not going to call her PCP office but will call her urologist right now.

## 2020-10-30 NOTE — Progress Notes (Signed)
Order placed

## 2020-10-30 NOTE — Progress Notes (Signed)
Patient was called and informed of her Nephrology referral, patient states that she see [facility] and will set up an appt with them for follow up

## 2020-10-31 DIAGNOSIS — N1831 Chronic kidney disease, stage 3a: Secondary | ICD-10-CM | POA: Diagnosis not present

## 2020-10-31 DIAGNOSIS — M5136 Other intervertebral disc degeneration, lumbar region: Secondary | ICD-10-CM | POA: Diagnosis not present

## 2020-10-31 DIAGNOSIS — M47816 Spondylosis without myelopathy or radiculopathy, lumbar region: Secondary | ICD-10-CM | POA: Diagnosis not present

## 2020-10-31 DIAGNOSIS — R8290 Unspecified abnormal findings in urine: Secondary | ICD-10-CM | POA: Diagnosis not present

## 2020-11-03 DIAGNOSIS — R600 Localized edema: Secondary | ICD-10-CM | POA: Diagnosis not present

## 2020-11-03 DIAGNOSIS — I10 Essential (primary) hypertension: Secondary | ICD-10-CM | POA: Diagnosis not present

## 2020-11-03 DIAGNOSIS — N1831 Chronic kidney disease, stage 3a: Secondary | ICD-10-CM | POA: Diagnosis not present

## 2020-11-03 DIAGNOSIS — N10 Acute tubulo-interstitial nephritis: Secondary | ICD-10-CM | POA: Diagnosis not present

## 2020-11-04 ENCOUNTER — Other Ambulatory Visit: Payer: Self-pay

## 2020-11-04 ENCOUNTER — Ambulatory Visit
Admission: RE | Admit: 2020-11-04 | Discharge: 2020-11-04 | Disposition: A | Discharge status: Home / Self Care | Payer: Medicare Other | Source: Ambulatory Visit | Attending: Nephrology | Admitting: Nephrology

## 2020-11-04 DIAGNOSIS — N390 Urinary tract infection, site not specified: Secondary | ICD-10-CM | POA: Insufficient documentation

## 2020-11-04 DIAGNOSIS — M1711 Unilateral primary osteoarthritis, right knee: Secondary | ICD-10-CM | POA: Diagnosis not present

## 2020-11-04 DIAGNOSIS — I129 Hypertensive chronic kidney disease with stage 1 through stage 4 chronic kidney disease, or unspecified chronic kidney disease: Secondary | ICD-10-CM | POA: Diagnosis not present

## 2020-11-04 DIAGNOSIS — G8929 Other chronic pain: Secondary | ICD-10-CM | POA: Diagnosis not present

## 2020-11-04 DIAGNOSIS — N183 Chronic kidney disease, stage 3 unspecified: Secondary | ICD-10-CM | POA: Diagnosis not present

## 2020-11-04 DIAGNOSIS — M25562 Pain in left knee: Secondary | ICD-10-CM | POA: Diagnosis not present

## 2020-11-04 DIAGNOSIS — M25561 Pain in right knee: Secondary | ICD-10-CM | POA: Diagnosis not present

## 2020-11-04 DIAGNOSIS — M25462 Effusion, left knee: Secondary | ICD-10-CM | POA: Diagnosis not present

## 2020-11-04 DIAGNOSIS — M25461 Effusion, right knee: Secondary | ICD-10-CM | POA: Diagnosis not present

## 2020-11-04 DIAGNOSIS — M7122 Synovial cyst of popliteal space [Baker], left knee: Secondary | ICD-10-CM | POA: Diagnosis not present

## 2020-11-04 DIAGNOSIS — M1712 Unilateral primary osteoarthritis, left knee: Secondary | ICD-10-CM | POA: Diagnosis not present

## 2020-11-04 MED ORDER — SODIUM CHLORIDE FLUSH 0.9 % IV SOLN
INTRAVENOUS | Status: AC
  Filled 2020-11-04: qty 10

## 2020-11-04 MED ORDER — SODIUM CHLORIDE 0.9 % IV SOLN
1.0000 g | Freq: Once | INTRAVENOUS | Status: AC
  Administered 2020-11-04: 1 g via INTRAVENOUS
  Filled 2020-11-04: qty 10

## 2020-11-05 ENCOUNTER — Ambulatory Visit
Admission: RE | Admit: 2020-11-05 | Discharge: 2020-11-05 | Disposition: A | Discharge status: Home / Self Care | Payer: Medicare Other | Source: Ambulatory Visit | Attending: Nephrology | Admitting: Nephrology

## 2020-11-05 DIAGNOSIS — N183 Chronic kidney disease, stage 3 unspecified: Secondary | ICD-10-CM | POA: Diagnosis not present

## 2020-11-05 DIAGNOSIS — I129 Hypertensive chronic kidney disease with stage 1 through stage 4 chronic kidney disease, or unspecified chronic kidney disease: Secondary | ICD-10-CM | POA: Insufficient documentation

## 2020-11-05 DIAGNOSIS — N390 Urinary tract infection, site not specified: Secondary | ICD-10-CM | POA: Diagnosis not present

## 2020-11-05 MED ORDER — SODIUM CHLORIDE 0.9 % IV SOLN
1.0000 g | Freq: Once | INTRAVENOUS | Status: AC
  Administered 2020-11-05: 1 g via INTRAVENOUS
  Filled 2020-11-05: qty 10

## 2020-11-06 DIAGNOSIS — I83893 Varicose veins of bilateral lower extremities with other complications: Secondary | ICD-10-CM | POA: Diagnosis not present

## 2020-11-06 DIAGNOSIS — I872 Venous insufficiency (chronic) (peripheral): Secondary | ICD-10-CM | POA: Diagnosis not present

## 2020-11-07 ENCOUNTER — Ambulatory Visit
Admission: RE | Admit: 2020-11-07 | Discharge: 2020-11-07 | Disposition: A | Discharge status: Home / Self Care | Payer: Medicare Other | Source: Ambulatory Visit | Attending: Nephrology | Admitting: Nephrology

## 2020-11-07 ENCOUNTER — Other Ambulatory Visit: Payer: Self-pay

## 2020-11-07 DIAGNOSIS — N183 Chronic kidney disease, stage 3 unspecified: Secondary | ICD-10-CM | POA: Insufficient documentation

## 2020-11-07 DIAGNOSIS — N390 Urinary tract infection, site not specified: Secondary | ICD-10-CM | POA: Insufficient documentation

## 2020-11-07 DIAGNOSIS — I129 Hypertensive chronic kidney disease with stage 1 through stage 4 chronic kidney disease, or unspecified chronic kidney disease: Secondary | ICD-10-CM | POA: Insufficient documentation

## 2020-11-07 MED ORDER — SODIUM CHLORIDE 0.9 % IV SOLN
1.0000 g | Freq: Once | INTRAVENOUS | Status: AC
  Administered 2020-11-07: 1 g via INTRAVENOUS
  Filled 2020-11-07: qty 1

## 2020-11-11 ENCOUNTER — Other Ambulatory Visit: Payer: Self-pay | Admitting: Obstetrics and Gynecology

## 2020-11-11 DIAGNOSIS — M25462 Effusion, left knee: Secondary | ICD-10-CM | POA: Diagnosis not present

## 2020-11-11 DIAGNOSIS — M170 Bilateral primary osteoarthritis of knee: Secondary | ICD-10-CM | POA: Diagnosis not present

## 2020-11-11 DIAGNOSIS — M25562 Pain in left knee: Secondary | ICD-10-CM | POA: Diagnosis not present

## 2020-11-11 DIAGNOSIS — M7122 Synovial cyst of popliteal space [Baker], left knee: Secondary | ICD-10-CM | POA: Diagnosis not present

## 2020-11-11 DIAGNOSIS — M25461 Effusion, right knee: Secondary | ICD-10-CM | POA: Diagnosis not present

## 2020-11-11 DIAGNOSIS — M25561 Pain in right knee: Secondary | ICD-10-CM | POA: Diagnosis not present

## 2020-11-11 DIAGNOSIS — G8929 Other chronic pain: Secondary | ICD-10-CM | POA: Diagnosis not present

## 2020-11-11 IMAGING — MR MRI LUMBAR SPINE WITHOUT CONTRAST
5 series · 31 of 48 positions shown · non-contrast
Comparison: Lumbar radiographs dated 12/22/2010

CLINICAL DATA: Chronic progressive low back pain. Bilateral leg
pain, left greater than right.

EXAM:
MRI LUMBAR SPINE WITHOUT CONTRAST
TECHNIQUE: Multiplanar, multisequence MR imaging of the lumbar spine was
performed. No intravenous contrast was administered.

[Series 5: T2 · sagittal · 4.0mm · 0.81mm/px · 6 of 17 slices shown (1 of 2)]
[im 1/17]
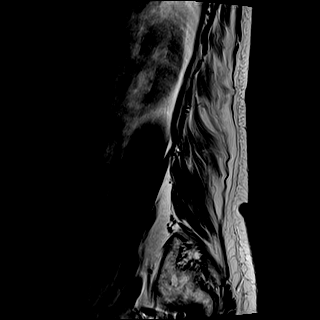
[im 4/17]
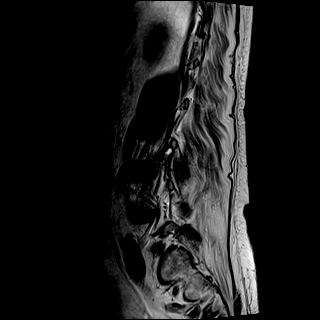
[im 7/17]
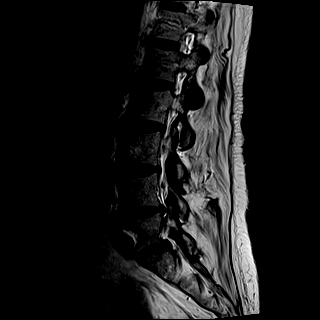
[im 10/17]
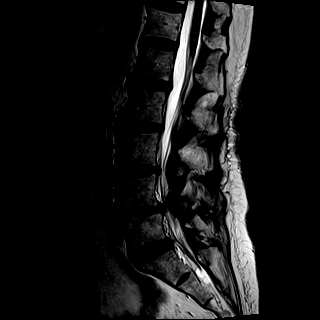
[im 13/17]
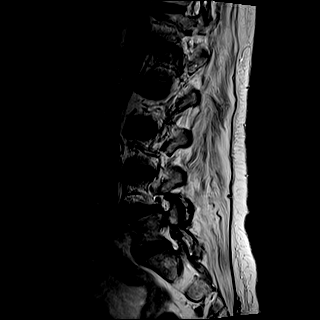
[im 17/17]
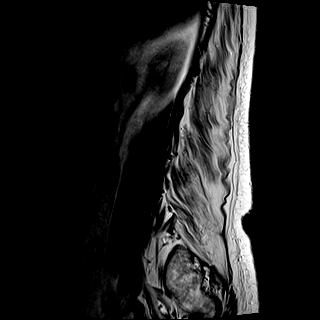

[Series 6: T1 · sagittal · 4.0mm · 0.81mm/px · 7 of 17 slices shown (1 of 2)]
[im 1/17]
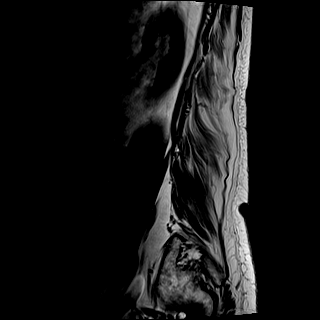
[im 3/17]
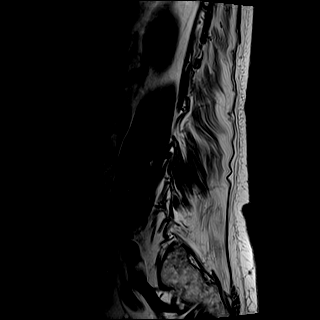
[im 6/17]
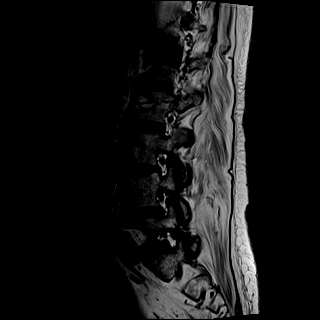
[im 9/17]
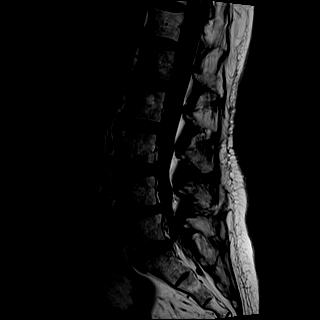
[im 11/17]
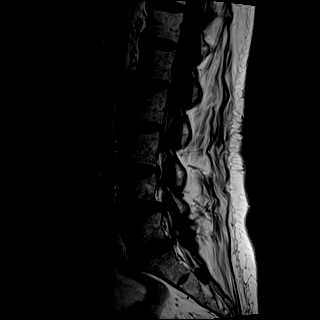
[im 14/17]
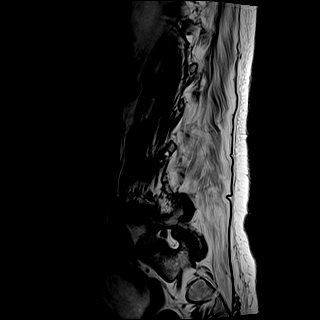
[im 17/17]
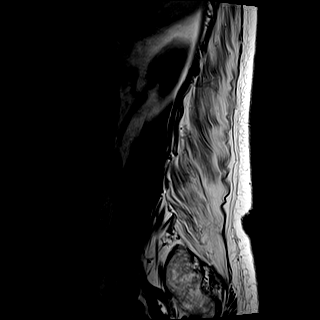

[Series 7: STIR · sagittal · 4.0mm · 0.41mm/px · 2 of 17 slices shown]
[im 1/17]
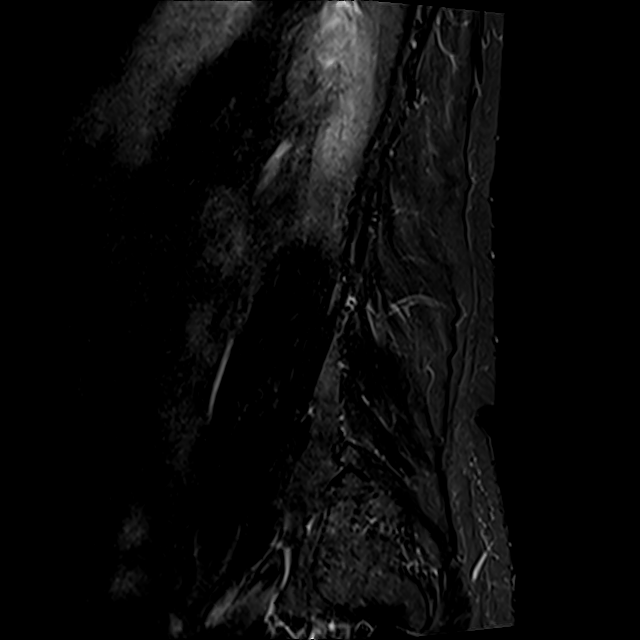
[im 3/17]
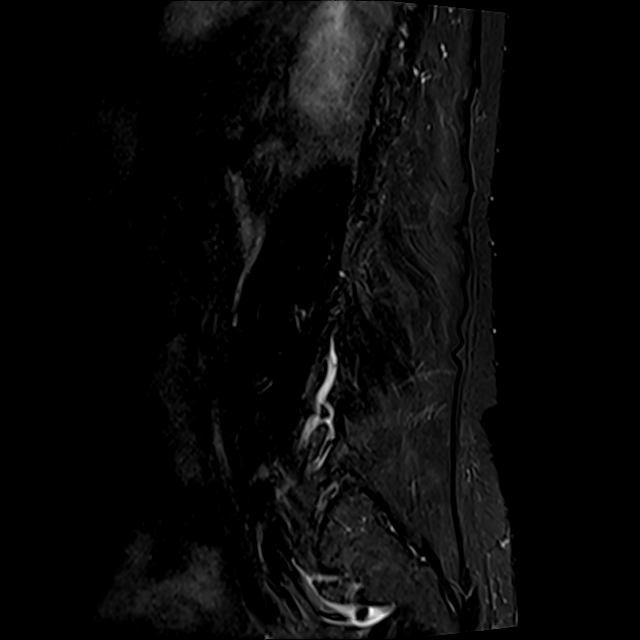

[Series 8: T2 · axial · 4.0mm · 0.78mm/px · z∈[-68,+146]mm · 8 of 37 slices shown (2 of 2)]
[im 1/37]
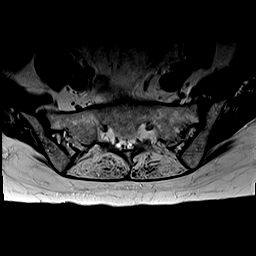
[im 6/37]
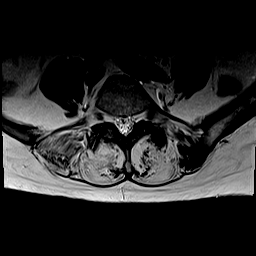
[im 12/37]
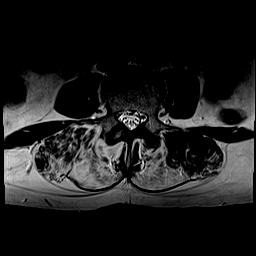
[im 17/37]
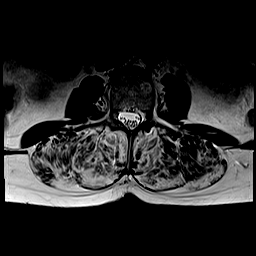
[im 20/37]
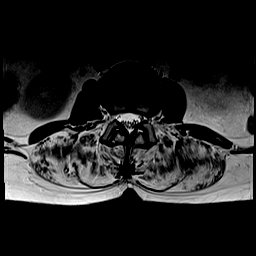
[im 25/37]
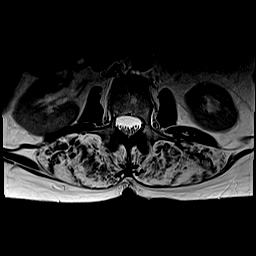
[im 31/37]
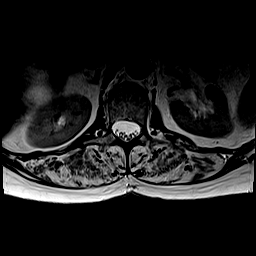
[im 37/37]
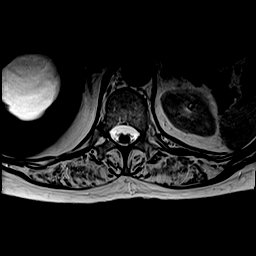

[Series 9: T1 · axial · 4.0mm · 0.39mm/px · z∈[-68,+146]mm · 8 of 37 slices shown (2 of 2)]
[im 1/37]
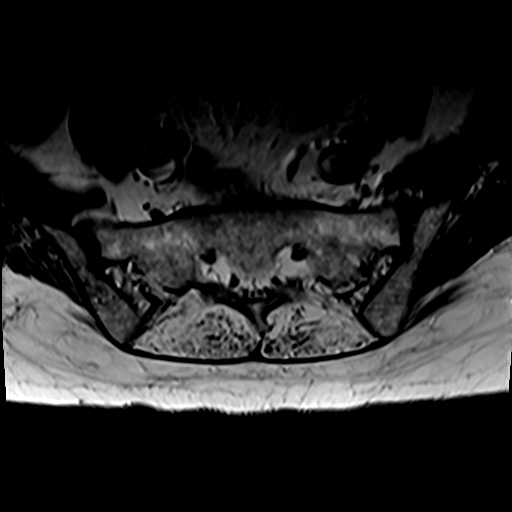
[im 6/37]
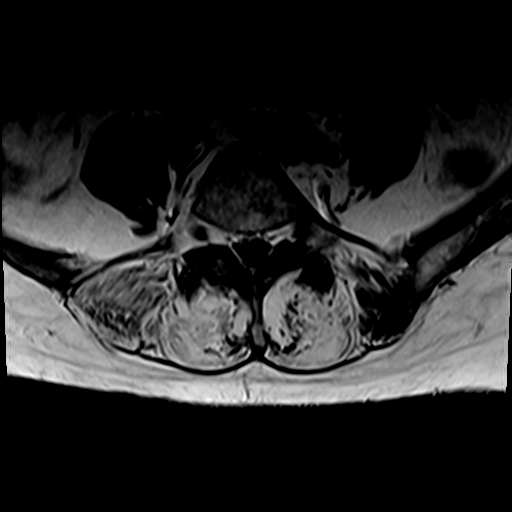
[im 12/37]
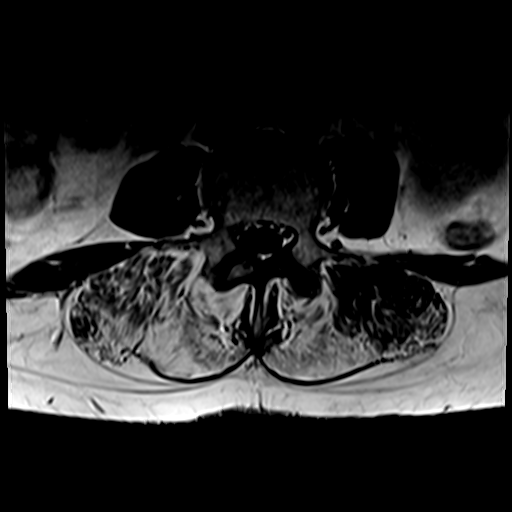
[im 17/37]
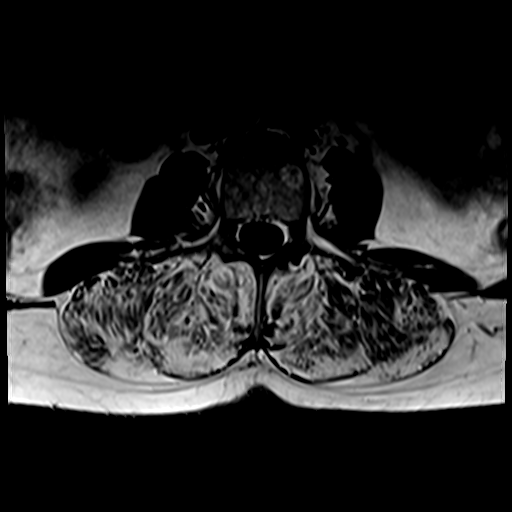
[im 20/37]
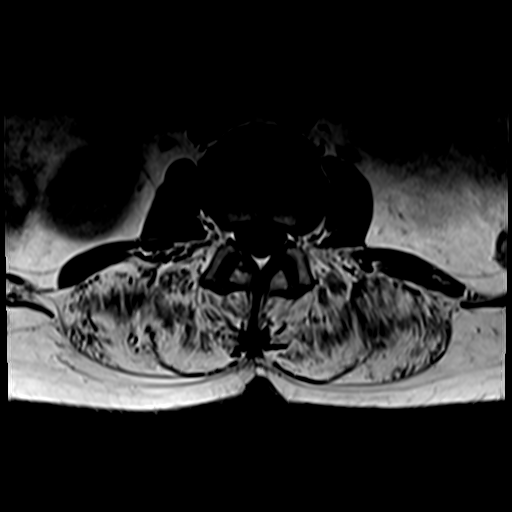
[im 25/37]
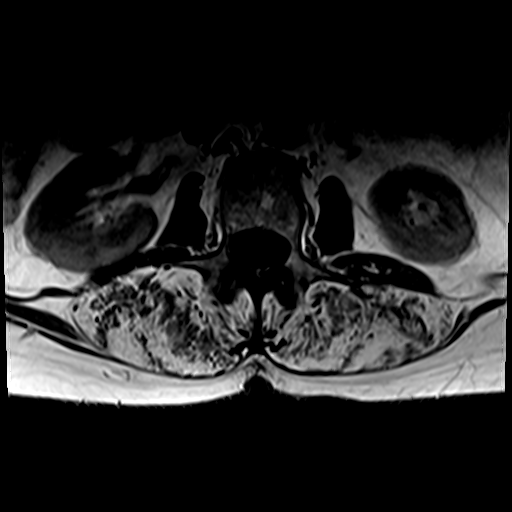
[im 31/37]
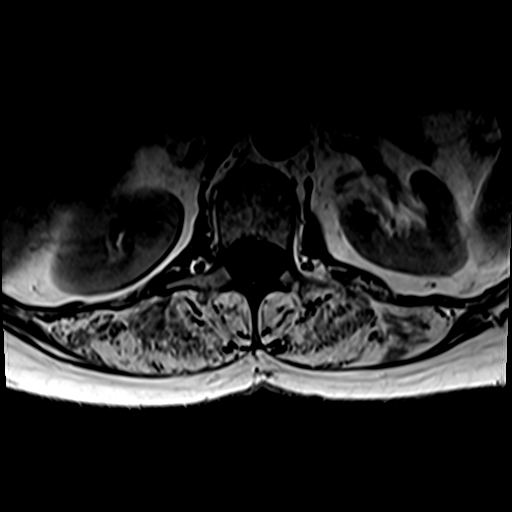
[im 37/37]
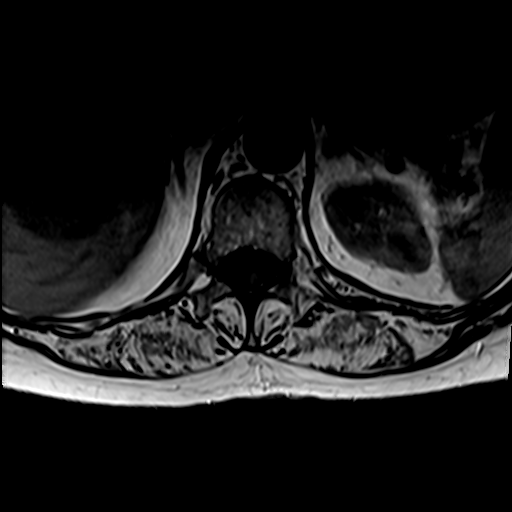

[31 of 48 positions shown; findings below may reference images not displayed]

FINDINGS: Segmentation:  Standard.

Alignment: 3 mm anterolisthesis of L4 on L5. 2 mm anterolisthesis of
L5 on S1.

Vertebrae:  No fracture, evidence of discitis, or bone lesion.

Conus medullaris and cauda equina: Conus extends to the L1-2 level.
Conus and cauda equina appear normal.

Paraspinal and other soft tissues: 5.4 cm simple appearing cyst in
the right lobe of the liver, increased in size since CT scan dated
04/02/2007. Otherwise normal.

Disc levels:

T12-L1: Normal.

L1-2: Tiny broad-based disc bulge with no neural impingement.
Otherwise normal.

L2-3: Disc desiccation.  Otherwise normal.

L3-4: Disc desiccation. Small broad-based disc bulge with a small
central subligamentous disc protrusion. Hypertrophy of the
ligamentum flavum and facet joints create minimal spinal stenosis
without focal neural impingement.

L4-5: Moderate bilateral facet arthritis. 3 mm spondylolisthesis.
Diffuse small broad-based bulge of the disc. This combines with the
hypertrophied ligamentum flavum and facet joints to create moderate
spinal stenosis with encroachment upon both lateral recesses which
might affect the L5 nerves.

L5-S1: Severe bilateral facet arthritis. 2 mm spondylolisthesis.
Minimal broad-based disc bulge slightly asymmetric into the left
neural foramen. However, the L5 nerves appear to exit without
impingement. No compression of the thecal sac.
IMPRESSION: 1. Moderate spinal stenosis and compression of the lateral recesses
at L4-5 which could affect the L5 nerves.
2. Facet arthritis primarily at L4-5 and L5-S1.
3. Tiny cysts central subligamentous disc protrusion at L3-4 without
focal neural impingement.

## 2020-11-14 DIAGNOSIS — J301 Allergic rhinitis due to pollen: Secondary | ICD-10-CM | POA: Diagnosis not present

## 2020-11-18 DIAGNOSIS — M25562 Pain in left knee: Secondary | ICD-10-CM | POA: Diagnosis not present

## 2020-11-18 DIAGNOSIS — N8110 Cystocele, unspecified: Secondary | ICD-10-CM | POA: Diagnosis not present

## 2020-11-18 DIAGNOSIS — M25561 Pain in right knee: Secondary | ICD-10-CM | POA: Diagnosis not present

## 2020-11-18 DIAGNOSIS — M1711 Unilateral primary osteoarthritis, right knee: Secondary | ICD-10-CM | POA: Diagnosis not present

## 2020-11-18 DIAGNOSIS — M25462 Effusion, left knee: Secondary | ICD-10-CM | POA: Diagnosis not present

## 2020-11-18 DIAGNOSIS — M1712 Unilateral primary osteoarthritis, left knee: Secondary | ICD-10-CM | POA: Diagnosis not present

## 2020-11-18 DIAGNOSIS — G8929 Other chronic pain: Secondary | ICD-10-CM | POA: Diagnosis not present

## 2020-11-18 DIAGNOSIS — M25461 Effusion, right knee: Secondary | ICD-10-CM | POA: Diagnosis not present

## 2020-11-18 DIAGNOSIS — N3020 Other chronic cystitis without hematuria: Secondary | ICD-10-CM | POA: Diagnosis not present

## 2020-11-19 ENCOUNTER — Other Ambulatory Visit: Payer: Self-pay | Admitting: Gastroenterology

## 2020-11-19 DIAGNOSIS — J301 Allergic rhinitis due to pollen: Secondary | ICD-10-CM | POA: Diagnosis not present

## 2020-11-20 ENCOUNTER — Other Ambulatory Visit: Payer: Self-pay | Admitting: Obstetrics

## 2020-11-20 ENCOUNTER — Other Ambulatory Visit: Payer: Self-pay | Admitting: Obstetrics and Gynecology

## 2020-11-20 ENCOUNTER — Telehealth: Payer: Self-pay

## 2020-11-20 DIAGNOSIS — Z1231 Encounter for screening mammogram for malignant neoplasm of breast: Secondary | ICD-10-CM

## 2020-11-20 NOTE — Progress Notes (Signed)
Mammo order

## 2020-11-20 NOTE — Telephone Encounter (Addendum)
Pt called 9:38 and states that she need a med refill on her Progestrone 4 % cream and Estriodal cream 1 mg. Pt states she has an appt [place].

## 2020-11-26 DIAGNOSIS — M47816 Spondylosis without myelopathy or radiculopathy, lumbar region: Secondary | ICD-10-CM | POA: Diagnosis not present

## 2020-12-01 ENCOUNTER — Telehealth: Payer: Self-pay

## 2020-12-01 DIAGNOSIS — N3020 Other chronic cystitis without hematuria: Secondary | ICD-10-CM | POA: Diagnosis not present

## 2020-12-01 NOTE — Telephone Encounter (Signed)
Pt state she is going out of town Friday and needs a refill on her 4% progesterone cream , sent to [facility]

## 2020-12-02 NOTE — Telephone Encounter (Signed)
Pt calling; needs refill of progesterone 4% cream; [facility] has sent request Friday and this am; please let pt know if we have not recv'd it.  [phone]

## 2020-12-02 NOTE — Telephone Encounter (Signed)
Let her know as this is a compounded prescription she needs to let [facility] know to fax their specific Rx to [place] to confirm and refill

## 2020-12-02 NOTE — Telephone Encounter (Signed)
I don't have fax from [facility]. Check with Dr. Kenton Kingfisher to see if went to him since he originally ordered. If not, please call [facility] directly to get exact medication info and sig and disp amt. Thx.

## 2020-12-02 NOTE — Telephone Encounter (Signed)
Pt aware.

## 2020-12-03 DIAGNOSIS — K449 Diaphragmatic hernia without obstruction or gangrene: Secondary | ICD-10-CM | POA: Diagnosis not present

## 2020-12-03 DIAGNOSIS — K219 Gastro-esophageal reflux disease without esophagitis: Secondary | ICD-10-CM | POA: Diagnosis not present

## 2020-12-03 NOTE — Telephone Encounter (Signed)
Called [facility] to have request faxed again; they were faxing to our main number; our fax number given and fax was received.

## 2020-12-04 ENCOUNTER — Ambulatory Visit (INDEPENDENT_AMBULATORY_CARE_PROVIDER_SITE_OTHER): Payer: Medicare Other | Admitting: Gastroenterology

## 2020-12-04 ENCOUNTER — Other Ambulatory Visit: Payer: Self-pay

## 2020-12-04 VITALS — BP 155/70 | HR 65 | Temp 97.6°F | Wt 161.8 lb

## 2020-12-04 DIAGNOSIS — R110 Nausea: Secondary | ICD-10-CM | POA: Diagnosis not present

## 2020-12-04 DIAGNOSIS — R197 Diarrhea, unspecified: Secondary | ICD-10-CM | POA: Diagnosis not present

## 2020-12-04 NOTE — Progress Notes (Signed)
Vonda Antigua, MD [address]  [place], [place] [address]  Main: [phone]  Fax: [phone]   Primary Care Physician: Charlynne Cousins, MD   Chief complaint: Nausea  HPI: Julia Deleon is a 75 y.o. female here for follow-up of nausea and diarrhea.  Patient states diarrhea has completely resolved.  Continues to have nausea but this is improving as well. States she saw [place] [facility] for her hiatal hernia and they have ordered more tests at this time.  Denies any heartburn.  Stopped taking her Prilosec and has not had any heartburn.  No vomiting.  No weight loss.  States she was previously given Zofran but this caused constipation so she stopped taking it.  However, states that she looked into it further and it was a higher dose and she has used before for Zofran.  Most recent EGD colonoscopy and labs including pancreatic fecal elastase, stool for H. pylori antigen have all been reassuring  ROS: All ROS reviewed and negative except as per HPI   Past Medical History:  Diagnosis Date   Arthritis    Chronic kidney disease    COPD (chronic obstructive pulmonary disease) ([place])    pt denies    Dyspnea    Family history of adverse reaction to anesthesia    father was slow to wake up   Fuch's endothelial dystrophy    GERD (gastroesophageal reflux disease)    Hyperlipidemia    Hypertension    Hypothyroidism    Pneumonia    hx of    PONV (postoperative nausea and vomiting)    slow to wake up and PONV    Past Surgical History:  Procedure Laterality Date   ABDOMINAL HYSTERECTOMY     APPENDECTOMY     BACK SURGERY  09/26/2018   L4-5 PLIF by Dr. Arnoldo Morale   CARDIAC CATHETERIZATION     CHOLECYSTECTOMY     COLONOSCOPY WITH PROPOFOL N/A 10/23/2020   Procedure: COLONOSCOPY WITH PROPOFOL;  Surgeon: Virgel Manifold, MD;  Location: [facility] ENDOSCOPY;  Service: Endoscopy;  Laterality: N/A;   ESOPHAGOGASTRODUODENOSCOPY N/A 10/23/2020   Procedure:  ESOPHAGOGASTRODUODENOSCOPY (EGD);  Surgeon: Virgel Manifold, MD;  Location: [facility] ENDOSCOPY;  Service: Endoscopy;  Laterality: N/A;   EYE SURGERY     FOOT SURGERY Right    pin removed left   laser vein surgery     NASAL SINUS SURGERY     RIGHT/LEFT HEART CATH AND CORONARY ANGIOGRAPHY N/A 12/06/2017   Procedure: RIGHT/LEFT HEART CATH AND CORONARY ANGIOGRAPHY;  Surgeon: Yolonda Kida, MD;  Location: [place] [facility];  Service: Cardiovascular;  Laterality: N/A;   ROBOTIC ASSISTED LAPAROSCOPIC SACROCOLPOPEXY Bilateral 07/30/2020   Procedure: XI ROBOTIC ASSISTED LAPAROSCOPIC SACROCOLPOPEXY AND SUPRACERVICAL HYSTERECTOMY WITH BILATERAL SALPINGO OOPHERECTOMY;  Surgeon: Ardis Hughs, MD;  Location: [facility];  Service: Urology;  Laterality: Bilateral;  REQUESTING 4 HRS   TONSILLECTOMY      Prior to Admission medications   Medication Sig Start Date End Date Taking? Authorizing Provider  acetaminophen (TYLENOL) 650 MG CR tablet Take 650-1,300 mg by mouth every 8 (eight) hours as needed for pain.    Yes [provider]  azelastine (ASTELIN) 0.1 % nasal spray Place 1 spray into both nostrils daily. Use in each nostril as directed   Yes [provider]  chlorzoxazone (PARAFON) 500 MG tablet TAKE ONE TABLET FOUR TIMES DAILY AS NEEDED FOR MUSCLE SPASM 10/09/20  Yes [name], Avanti, MD  D-Mannose POWD Take by mouth.  Yes [provider]  DHEA 10 MG TABS Take by mouth.   Yes [provider]  EPINEPHrine 0.3 mg/0.3 mL IJ SOAJ injection Inject 0.3 mg into the muscle as needed for anaphylaxis. 01/19/19  Yes [provider]  fexofenadine (ALLEGRA) 60 MG tablet Take 60 mg by mouth daily.   Yes [provider]  levothyroxine (SYNTHROID) 75 MCG tablet TAKE ONE TABLET EACH MORNING BEFORE BREAKFAST Patient taking differently: Take 75 mcg by mouth daily before breakfast. TAKE ONE TABLET EACH MORNING BEFORE BREAKFAST 09/30/19  Yes Johnson, Megan P, DO   losartan (COZAAR) 100 MG tablet Take 1 tablet (100 mg total) by mouth daily. 04/07/20  Yes Johnson, Megan P, DO  Lutein 20 MG CAPS Take 1 capsule by mouth daily.   Yes [provider]  triamcinolone (NASACORT ALLERGY 24HR) 55 MCG/ACT AERO nasal inhaler Place 2 sprays daily into the nose. Patient taking differently: Place 2 sprays into the nose daily as needed (allergies). 03/28/17  Yes Crissman, Jeannette How, MD  colestipol (COLESTID) 1 g tablet Take 2 tablets (2 g total) by mouth daily. 10/30/20 11/29/20  Virgel Manifold, MD  omeprazole (PRILOSEC) 40 MG capsule Take 1 capsule (40 mg total) by mouth daily. 10/09/20 11/08/20  Virgel Manifold, MD    Family History  Problem Relation Age of Onset   Cancer Mother        lung   CAD Mother    Stroke Father    Breast cancer Sister        31s   Varicose Veins Son    Heart disease Maternal Grandmother    Cancer Paternal Grandmother        throat   Emphysema Paternal Grandfather    Fibromyalgia Sister    Brain cancer Sister    Breast cancer Other      Social History   Tobacco Use   Smoking status: Never   Smokeless tobacco: Never  Vaping Use   Vaping Use: Never used  Substance Use Topics   Alcohol use: No    Alcohol/week: 0.0 standard drinks   Drug use: No    Allergies as of 12/04/2020 - Review Complete 12/04/2020  Allergen Reaction Noted   Colesevelam Nausea And Vomiting 12/09/2016   Levofloxacin Hives 06/12/2015   Amoxicillin  07/14/2020   Benazepril Other (See Comments) 06/04/2013   Methylisothiazolinone Other (See Comments) 12/12/2017   Thimerosal Other (See Comments) 12/12/2017   Amoxicillin-pot clavulanate Nausea Only 11/27/2014   Latex Rash 11/29/2017   Simvastatin Itching 11/26/2014    Physical Examination:  Constitutional: General:   Alert,  Well-developed, well-nourished, pleasant and cooperative in NAD BP (!) 155/70   Pulse 65   Temp 97.6 F (36.4 C) (Oral)   Wt 161 lb 12.8 oz (73.4 kg)   LMP   (LMP Unknown)   BMI 27.77 kg/m   Respiratory: Normal respiratory effort  Gastrointestinal:  Soft, non-tender and non-distended without masses, hepatosplenomegaly or hernias noted.  No guarding or rebound tenderness.     Cardiac: No clubbing or edema.  No cyanosis. Normal posterior tibial pedal pulses noted.  Psych:  Alert and cooperative. Normal mood and affect.  Musculoskeletal:  Normal gait. Head normocephalic, atraumatic. Symmetrical without gross deformities. 5/5 Lower extremity strength bilaterally.  Skin: Warm. Intact without significant lesions or rashes. No jaundice.  Neck: Supple, trachea midline  Lymph: No cervical lymphadenopathy  Psych:  Alert and oriented x3, Alert and cooperative. Normal mood and affect.  Labs: CMP  Component Value Date/Time   NA 138 09/24/2020 1520   K 4.8 09/24/2020 1520   CL 102 09/24/2020 1520   CO2 19 (L) 09/24/2020 1520   GLUCOSE 84 09/24/2020 1520   GLUCOSE 142 (H) 07/31/2020 0341   BUN 29 (H) 09/24/2020 1520   CREATININE 1.30 (H) 10/29/2020 0951   CALCIUM 9.3 09/24/2020 1520   PROT 6.2 09/19/2020 1549   ALBUMIN 3.9 09/19/2020 1549   AST 16 09/19/2020 1549   AST 25 10/17/2017 0937   ALT 15 09/19/2020 1549   ALT 26 10/17/2017 0937   ALKPHOS 58 09/19/2020 1549   BILITOT 0.3 09/19/2020 1549   GFRNONAA 54 (L) 07/31/2020 0341   GFRAA 56 (L) 04/11/2020 0924   Lab Results  Component Value Date   WBC 9.2 09/19/2020   HGB 12.0 09/19/2020   HCT 35.3 09/19/2020   MCV 94 09/19/2020   PLT 218 09/19/2020    Imaging Studies:   Assessment and Plan:   Julia Deleon is a 75 y.o. y/o female here for follow-up of nausea and diarrhea  Diarrhea has completely resolved  Patient continues to have nausea, about once a day but is not using anything for it.  However, is willing to try Zofran.  States she has a lower dose of Zofran at home from a previous hospital stay and would like to try that instead of getting a prescription.  States she  had constipation with the higher dose of Zofran before and that is why she stopped taking it.  I have advised her to take Zofran 4 mg every other day to start, until her nausea improves.  If she needs a refill after she gets home, patient advised to call us back and let us know.  She can even use it every day as long as it does not cause constipation that she had from it before.  Continue follow-up with [place] [facility]  She is also awaiting nephrology evaluation as per PCP note from [place] 2022  With her recent UTIs, requiring antibiotics, her nausea is likely related to that, with no significant organic abdominal pathology.    Reasonable to continue work-up with [place] [facility] for hiatal hernia at this time  Patient does state that she feels her nausea is worse when she is on an empty stomach.  She was advised to set a timer and eat small meals or snacks about every 2 hours to see if it improves her symptoms as well.  No diagnosed hypoglycemia episodes in the past with previous CMP showing normal to mildly elevated glucose levels before  Clinic follow-up in 2 to 3 months or earlier if needed.  Symptoms overall improving at this time  Dr Vonda Antigua

## 2020-12-10 ENCOUNTER — Other Ambulatory Visit: Payer: Medicare Other

## 2020-12-16 ENCOUNTER — Other Ambulatory Visit: Payer: Self-pay

## 2020-12-16 ENCOUNTER — Ambulatory Visit
Admission: RE | Admit: 2020-12-16 | Discharge: 2020-12-16 | Disposition: A | Discharge status: Home / Self Care | Payer: Medicare Other | Source: Ambulatory Visit | Attending: Obstetrics and Gynecology | Admitting: Obstetrics and Gynecology

## 2020-12-16 DIAGNOSIS — Z1231 Encounter for screening mammogram for malignant neoplasm of breast: Secondary | ICD-10-CM | POA: Diagnosis not present

## 2020-12-17 ENCOUNTER — Other Ambulatory Visit: Payer: Medicare Other

## 2020-12-18 ENCOUNTER — Other Ambulatory Visit: Payer: Self-pay | Admitting: Family Medicine

## 2020-12-18 DIAGNOSIS — I10 Essential (primary) hypertension: Secondary | ICD-10-CM

## 2020-12-23 ENCOUNTER — Other Ambulatory Visit: Payer: Self-pay | Admitting: Surgery

## 2020-12-23 DIAGNOSIS — K219 Gastro-esophageal reflux disease without esophagitis: Secondary | ICD-10-CM

## 2020-12-23 DIAGNOSIS — K449 Diaphragmatic hernia without obstruction or gangrene: Secondary | ICD-10-CM

## 2020-12-24 ENCOUNTER — Ambulatory Visit: Payer: Medicare Other | Admitting: Internal Medicine

## 2020-12-29 DIAGNOSIS — M5136 Other intervertebral disc degeneration, lumbar region: Secondary | ICD-10-CM | POA: Diagnosis not present

## 2020-12-29 DIAGNOSIS — M47816 Spondylosis without myelopathy or radiculopathy, lumbar region: Secondary | ICD-10-CM | POA: Diagnosis not present

## 2021-01-01 DIAGNOSIS — I10 Essential (primary) hypertension: Secondary | ICD-10-CM | POA: Diagnosis not present

## 2021-01-01 DIAGNOSIS — N179 Acute kidney failure, unspecified: Secondary | ICD-10-CM | POA: Diagnosis not present

## 2021-01-01 DIAGNOSIS — R600 Localized edema: Secondary | ICD-10-CM | POA: Diagnosis not present

## 2021-01-01 DIAGNOSIS — N1831 Chronic kidney disease, stage 3a: Secondary | ICD-10-CM | POA: Diagnosis not present

## 2021-01-01 DIAGNOSIS — D631 Anemia in chronic kidney disease: Secondary | ICD-10-CM | POA: Diagnosis not present

## 2021-01-05 NOTE — Progress Notes (Signed)
PCP: Charlynne Cousins, MD   Chief Complaint  Patient presents with   Gynecologic Exam    HPI:      [name] is a 75 y.o. [id number] whose LMP was No LMP recorded (lmp unknown). Patient has had a hysterectomy., presents today for her MEDICARE annual examination.  Her menses are absent due to menopause/now supracx hyst with BSO 3/22 due to bladder prolapse per pt. Done by Dr. Louis Meckel in [place]. No PMB.  She does not have vasomotor sx.  Pt on bioidentical HRT. Uses 1 ml prog crm 6 nights wkly and vag estradiol twice wkly with sx relief of vasomotor sx/vag dryness. Gets Rx at [facility]. Has had rebound sx in past if tries to wean off but willing to try to decrease prog. Had stopped vag ERT after bladder prolapse but urologist wants pt on vag ERT. Hx of recurrent UTIs. Hasn't restarted yet.   Sex activity: not sexually active. She does not have vaginal dryness.   Last Pap:  11/19/19 Results were: no abnormalities ; 11/13/18 pap was ASCUS, no HPV done. No hx of abn paps.  Hx of STDs: none  Last mammogram: 12/16/20 Results were: normal--routine follow-up in 12 months There is a FH of breast cancer in her sister and pat grt aunt, genetic testing not indicated. There is no FH of ovarian cancer. The patient does not do self-breast exams.  Colonoscopy: 2022 at [facility] with precancerous polyp, Repeat due after 1 years.   Tobacco use: The patient denies current or previous tobacco use. Alcohol use: none No drug use Exercise: not active  She does get adequate calcium and Vitamin D in her diet.  Labs with PCP. DEXA 9/19 at [facility] with normal spine/hip.   Past Medical History:  Diagnosis Date   Arthritis    Chronic kidney disease    COPD (chronic obstructive pulmonary disease) ([place])    pt denies    Dyspnea    Family history of adverse reaction to anesthesia    father was slow to wake up   Fuch's endothelial dystrophy    GERD (gastroesophageal reflux disease)    Hyperlipidemia     Hypertension    Hypothyroidism    Pneumonia    hx of    PONV (postoperative nausea and vomiting)    slow to wake up and PONV    Past Surgical History:  Procedure Laterality Date   ABDOMINAL HYSTERECTOMY     APPENDECTOMY     BACK SURGERY  09/26/2018   L4-5 PLIF by Dr. Arnoldo Morale   CARDIAC CATHETERIZATION     CHOLECYSTECTOMY     COLONOSCOPY WITH PROPOFOL N/A 10/23/2020   Procedure: COLONOSCOPY WITH PROPOFOL;  Surgeon: Virgel Manifold, MD;  Location: [facility] ENDOSCOPY;  Service: Endoscopy;  Laterality: N/A;   ESOPHAGOGASTRODUODENOSCOPY N/A 10/23/2020   Procedure: ESOPHAGOGASTRODUODENOSCOPY (EGD);  Surgeon: Virgel Manifold, MD;  [facility] END[facility]COPY;  Service: Endoscopy;  Laterality: N/A;   EYE SURGERY     FOOT SURGERY Right    pin removed left   laser vein surgery     NASAL SINUS SURGERY     RIGHT/LEFT HEART CATH AND CORONARY ANGIOGRAPHY N/A 12/06/2017   Procedure: RIGHT/LEFT HEART CATH AND CORONARY ANGIOGRAPHY;  Surgeon: Yolonda Kida, MD;  Location: [place] [facility];  Service: Cardiovascular;  Laterality: N/A;   ROBOTIC ASSISTED LAPAROSCOPIC SACROCOLPOPEXY Bilateral 07/30/2020   Procedure: XI ROBOTIC ASSISTED LAPAROSCOPIC SACROCOLPOPEXY AND SUPRACERVICAL HYSTERECTOMY WITH BILATERAL SALPINGO OOPHERECTOMY;  Surgeon: Louis Meckel  W, MD;  Location: [facility];  Service: Urology;  Laterality: Bilateral;  REQUESTING 4 HRS   TONSILLECTOMY      Family History  Problem Relation Age of Onset   Cancer Mother        lung   CAD Mother    Stroke Father    Breast cancer Sister        59s   Fibromyalgia Sister    Brain cancer Sister    Heart disease Maternal Grandmother    Cancer Paternal Grandmother        throat   Emphysema Paternal Grandfather    Varicose Veins Son    Breast cancer Other     Social History   Socioeconomic History   Marital status: Married    Spouse name: Not on file   Number of children: Not on file   Years of education: 4   Highest education  level: Bachelor's degree (e.g., BA, AB, BS)  Occupational History   Occupation: retired  Tobacco Use   Smoking status: Never   Smokeless tobacco: Never  Vaping Use   Vaping Use: Never used  Substance and Sexual Activity   Alcohol use: No    Alcohol/week: 0.0 standard drinks   Drug use: No   Sexual activity: Not Currently    Birth control/protection: Post-menopausal  Other Topics Concern   Not on file  Social History Narrative   Not on file   Social Determinants of Health   Financial Resource Strain: Low Risk    Difficulty of Paying Living Expenses: Not hard at all  Food Insecurity: No Food Insecurity   Worried About Charity fundraiser in the Last Year: Never true   [place] in the Last Year: Never true  Transportation Needs: No Transportation Needs   Lack of Transportation (Medical): No   Lack of Transportation (Non-Medical): No  Physical Activity: Insufficiently Active   Days of Exercise per Week: 3 days   Minutes of Exercise per Session: 40 min  Stress: No Stress Concern Present   Feeling of Stress : Not at all  Social Connections: Not on file  Intimate Partner Violence: Not on file     Current Outpatient Medications:    acetaminophen (TYLENOL) 650 MG CR tablet, Take 650-1,300 mg by mouth every 8 (eight) hours as needed for pain. , Disp: , Rfl:    AMBULATORY NON FORMULARY MEDICATION, Progesterone 4% cream Apply 1/2 ml 6 nights on, 1 night off NOTE DOSE CHANGE TO TRY TO WEAN OFF, Disp: 30 mL, Rfl: 5   azelastine (ASTELIN) 0.1 % nasal spray, Place 1 spray into both nostrils daily. Use in each nostril as directed, Disp: , Rfl:    chlorzoxazone (PARAFON) 500 MG tablet, TAKE ONE TABLET FOUR TIMES DAILY AS NEEDED FOR MUSCLE SPASM, Disp: 120 tablet, Rfl: 1   DHEA 10 MG TABS, Take by mouth., Disp: , Rfl:    EPINEPHrine 0.3 mg/0.3 mL IJ SOAJ injection, Inject 0.3 mg into the muscle as needed for anaphylaxis., Disp: , Rfl:    fexofenadine (ALLEGRA) 60 MG tablet, Take 60  mg by mouth daily., Disp: , Rfl:    levothyroxine (SYNTHROID) 75 MCG tablet, TAKE 1 TABLET BY MOUTH EVERY MORNING BEFORE BREAKFAST, Disp: 90 tablet, Rfl: 0   losartan (COZAAR) 100 MG tablet, TAKE 1 TABLET BY MOUTH ONCE DAILY, Disp: 90 tablet, Rfl: 0   Lutein 20 MG CAPS, Take 1 capsule by mouth daily., Disp: , Rfl:    triamcinolone (NASACORT  ALLERGY 24HR) 55 MCG/ACT AERO nasal inhaler, Place 2 sprays daily into the nose. (Patient taking differently: Place 2 sprays into the nose daily as needed (allergies).), Disp: 1 Inhaler, Rfl: 12   colestipol (COLESTID) 1 g tablet, Take 2 tablets (2 g total) by mouth daily., Disp: 60 tablet, Rfl: 0   estradiol (ESTRACE) 0.1 MG/GM vaginal cream, INSERT 0.5GM VAGINALLY 2-3 TIMES WEEKLY AS NEEDED, Disp: 42.5 g, Rfl: 1 No current facility-administered medications for this visit.  Facility-Administered Medications Ordered in Other Visits:    sodium phosphate (FLEET) 7-19 GM/118ML enema 1 enema, 1 enema, Rectal, Once, Ardis Hughs, MD     ROS:  Review of Systems  Constitutional:  Negative for fatigue, fever and unexpected weight change.  Respiratory:  Negative for cough, shortness of breath and wheezing.   Cardiovascular:  Negative for chest pain, palpitations and leg swelling.  Gastrointestinal:  Positive for diarrhea and nausea. Negative for blood in stool, constipation and vomiting.  Endocrine: Negative for cold intolerance, heat intolerance and polyuria.  Genitourinary:  Negative for dyspareunia, dysuria, flank pain, frequency, genital sores, hematuria, menstrual problem, pelvic pain, urgency, vaginal bleeding, vaginal discharge and vaginal pain.  Musculoskeletal:  Positive for arthralgias. Negative for back pain, joint swelling and myalgias.  Skin:  Negative for rash.  Neurological:  Negative for dizziness, syncope, light-headedness, numbness and headaches.  Hematological:  Negative for adenopathy.  Psychiatric/Behavioral:  Negative for agitation,  confusion, sleep disturbance and suicidal ideas. The patient is not nervous/anxious.   BREAST: No symptoms    Objective: BP (!) 150/60   Ht '5\' 4"'$  (1.626 m)   Wt 158 lb (71.7 kg)   LMP  (LMP Unknown)   BMI 27.12 kg/m    Physical Exam Constitutional:      Appearance: She is well-developed.  Genitourinary:     Vulva normal.     Genitourinary Comments: UTERUS/CX SURG REM     Right Labia: No rash, tenderness or lesions.    Left Labia: No tenderness, lesions or rash.    Vaginal cuff intact.    No vaginal discharge, erythema or tenderness.      Right Adnexa: not tender and no mass present.    Left Adnexa: not tender and no mass present.    Cervix is absent.     Uterus is absent.  Breasts:    Right: No mass, nipple discharge, skin change or tenderness.     Left: No mass, nipple discharge, skin change or tenderness.  Neck:     Thyroid: No thyromegaly.  Cardiovascular:     Rate and Rhythm: Normal rate and regular rhythm.     Heart sounds: Normal heart sounds. No murmur heard. Pulmonary:     Effort: Pulmonary effort is normal.     Breath sounds: Normal breath sounds.  Abdominal:     Palpations: Abdomen is soft.     Tenderness: There is no abdominal tenderness. There is no guarding.  Musculoskeletal:        General: Normal range of motion.     Cervical back: Normal range of motion.  Neurological:     General: No focal deficit present.     Mental Status: She is alert and oriented to person, place, and time.     Cranial Nerves: No cranial nerve deficit.  Skin:    General: Skin is warm and dry.  Psychiatric:        Mood and Affect: Mood normal.        Behavior: Behavior normal.  Thought Content: Thought content normal.        Judgment: Judgment normal.  Vitals reviewed.    Assessment/Plan:  Encounter for annual routine gynecological examination  Encounter for screening mammogram for malignant neoplasm of breast - Plan: MM 3D SCREEN BREAST BILATERAL; pt to sched  mammo next yr  Hormone replacement therapy (HRT) - Plan: AMBULATORY NON FORMULARY MEDICATION; Rx RF prog crm. Will do 1/2 ml dose to wean down and then off. Rx faxed to [facility], f/u prn.   Vasomotor symptoms due to menopause - Plan: AMBULATORY NON FORMULARY MEDICATION; Rx RF prog crm. Will try to wean down.   Vaginal atrophy - Plan: estradiol (ESTRACE) 0.1 MG/GM vaginal cream; cont vag ERT per urology. Rx eRxd. F/u prn.    Meds ordered this encounter  Medications   estradiol (ESTRACE) 0.1 MG/GM vaginal cream    Sig: INSERT 0.5GM VAGINALLY 2-3 TIMES WEEKLY AS NEEDED    Dispense:  42.5 g    Refill:  1    Order Specific Question:   Supervising Provider    Answer:   Gae Dry [id number]   AMBULATORY NON FORMULARY MEDICATION    Sig: Progesterone 4% cream Apply 1/2 ml 6 nights on, 1 night off NOTE DOSE CHANGE TO TRY TO WEAN OFF    Dispense:  30 mL    Refill:  5    Order Specific Question:   Supervising Provider    Answer:   Gae Dry [id number]            GYN counsel mammography screening, menopause, adequate intake of calcium and vitamin D, diet and exercise    F/U  Return in about 2 years (around 01/07/2023)./can have PCP manage HRT prn.   Fredrika Canby B. Hedwig Mcfall, PA-C 01/06/2021 3:40 PM

## 2021-01-06 ENCOUNTER — Other Ambulatory Visit: Payer: Self-pay

## 2021-01-06 ENCOUNTER — Ambulatory Visit (INDEPENDENT_AMBULATORY_CARE_PROVIDER_SITE_OTHER): Payer: Medicare Other | Admitting: Obstetrics and Gynecology

## 2021-01-06 ENCOUNTER — Encounter: Payer: Self-pay | Admitting: Obstetrics and Gynecology

## 2021-01-06 VITALS — BP 150/60 | Ht 64.0 in | Wt 158.0 lb

## 2021-01-06 DIAGNOSIS — Z01419 Encounter for gynecological examination (general) (routine) without abnormal findings: Secondary | ICD-10-CM

## 2021-01-06 DIAGNOSIS — N952 Postmenopausal atrophic vaginitis: Secondary | ICD-10-CM

## 2021-01-06 DIAGNOSIS — Z1231 Encounter for screening mammogram for malignant neoplasm of breast: Secondary | ICD-10-CM

## 2021-01-06 DIAGNOSIS — N951 Menopausal and female climacteric states: Secondary | ICD-10-CM

## 2021-01-06 DIAGNOSIS — Z79890 Hormone replacement therapy (postmenopausal): Secondary | ICD-10-CM

## 2021-01-06 MED ORDER — AMBULATORY NON FORMULARY MEDICATION: 5 refills | Status: DC

## 2021-01-06 MED ORDER — ESTRADIOL 0.1 MG/GM VA CREA: TOPICAL_CREAM | VAGINAL | 1 refills | Status: DC

## 2021-01-06 NOTE — Patient Instructions (Signed)
I value your feedback and you entrusting us with your care. If you get a [facility] patient survey, I would appreciate you taking the time to let us know about your experience today. Thank you! ? ? ?

## 2021-01-07 ENCOUNTER — Other Ambulatory Visit: Payer: Self-pay | Admitting: Obstetrics and Gynecology

## 2021-01-07 DIAGNOSIS — N952 Postmenopausal atrophic vaginitis: Secondary | ICD-10-CM

## 2021-01-07 DIAGNOSIS — N951 Menopausal and female climacteric states: Secondary | ICD-10-CM

## 2021-01-07 MED ORDER — AMBULATORY NON FORMULARY MEDICATION: 11 refills | Status: DC

## 2021-01-07 NOTE — Progress Notes (Signed)
[facility] called. Rx change to estriol compounded vaginal cream per pt pref. I refilled estrace crm yesterday at appt because that was the one I saw on med list.  New Rx faxed to [facility] to RF.

## 2021-01-09 ENCOUNTER — Ambulatory Visit
Admission: RE | Admit: 2021-01-09 | Discharge: 2021-01-09 | Disposition: A | Discharge status: Home / Self Care | Payer: Medicare Other | Source: Ambulatory Visit | Attending: Surgery | Admitting: Surgery

## 2021-01-09 DIAGNOSIS — K219 Gastro-esophageal reflux disease without esophagitis: Secondary | ICD-10-CM

## 2021-01-09 DIAGNOSIS — K449 Diaphragmatic hernia without obstruction or gangrene: Secondary | ICD-10-CM

## 2021-01-14 DIAGNOSIS — N390 Urinary tract infection, site not specified: Secondary | ICD-10-CM | POA: Diagnosis not present

## 2021-01-14 DIAGNOSIS — R110 Nausea: Secondary | ICD-10-CM | POA: Diagnosis not present

## 2021-01-19 ENCOUNTER — Other Ambulatory Visit: Payer: Self-pay

## 2021-01-19 ENCOUNTER — Ambulatory Visit (INDEPENDENT_AMBULATORY_CARE_PROVIDER_SITE_OTHER): Payer: Medicare Other | Admitting: Dermatology

## 2021-01-19 DIAGNOSIS — D2239 Melanocytic nevi of other parts of face: Secondary | ICD-10-CM | POA: Diagnosis not present

## 2021-01-19 DIAGNOSIS — D229 Melanocytic nevi, unspecified: Secondary | ICD-10-CM

## 2021-01-19 DIAGNOSIS — L309 Dermatitis, unspecified: Secondary | ICD-10-CM | POA: Diagnosis not present

## 2021-01-19 DIAGNOSIS — L820 Inflamed seborrheic keratosis: Secondary | ICD-10-CM

## 2021-01-19 DIAGNOSIS — D1800 Hemangioma unspecified site: Secondary | ICD-10-CM | POA: Diagnosis not present

## 2021-01-19 DIAGNOSIS — L821 Other seborrheic keratosis: Secondary | ICD-10-CM | POA: Diagnosis not present

## 2021-01-19 DIAGNOSIS — L918 Other hypertrophic disorders of the skin: Secondary | ICD-10-CM | POA: Diagnosis not present

## 2021-01-19 DIAGNOSIS — Z1283 Encounter for screening for malignant neoplasm of skin: Secondary | ICD-10-CM

## 2021-01-19 DIAGNOSIS — I781 Nevus, non-neoplastic: Secondary | ICD-10-CM

## 2021-01-19 NOTE — Progress Notes (Signed)
   Follow-Up Visit   Subjective  Stacee C Straker is a 75 y.o. female who presents for the following: Total body skin exam (No hx of skin ca), check spot (Ant neck, ~2 days), and growths (Under breast, irritated by bra). Recently had poison ivy rash on L hand, and now rash is coming up on neck. No h/o skin cancer.   The following portions of the chart were reviewed this encounter and updated as appropriate:       Review of Systems:  No other skin or systemic complaints except as noted in HPI or Assessment and Plan.  Objective  Well appearing patient in no apparent distress; mood and affect are within normal limits.  A full examination was performed including scalp, head, eyes, ears, nose, lips, neck, chest, axillae, abdomen, back, buttocks, bilateral upper extremities, bilateral lower extremities, hands, feet, fingers, toes, fingernails, and toenails. All findings within normal limits unless otherwise noted below.  anterior neck Scaly erythematous patch  L upper forehead  L upper forehead 4.3m flesh pap darker center  bil legs Telangiectasias bil legs  BL Inframammary Fold Waxy tan papules some with erythema, get irritated by bra during summertime   Assessment & Plan   Seborrheic Keratoses - Stuck-on, waxy, tan-brown papules and/or plaques  - Benign-appearing - Discussed benign etiology and prognosis. - Observe - Call for any changes - cheeks, back  Melanocytic Nevi - Tan-brown and/or pink-flesh-colored symmetric macules and papules - Benign appearing on exam today - Observation - Call clinic for new or changing moles - Recommend daily use of broad spectrum spf 30+ sunscreen to sun-exposed areas.   Hemangiomas - Red papules - Discussed benign nature - Observe - Call for any changes - back, arms  Acrochordons (Skin Tags) - Fleshy, skin-colored pedunculated papules - Benign appearing.  - Observe. - If desired, they can be removed with an in office procedure that  is not covered by insurance. - Please call the clinic if you notice any new or changing lesions.  - inframammary  Skin cancer screening performed today.  Dermatitis anterior neck  Possible Contact dermatitis  Pt has prescription cream at home that she used for poison ivy in past, advised she can use on neck qd until clear  Nevus L upper forehead  Benign-appearing.  Observation.  Call clinic for new or changing moles.  Recommend daily use of broad spectrum spf 30+ sunscreen to sun-exposed areas.    Spider veins bil legs  Benign, observe  Inflamed seborrheic keratosis BL Inframammary Fold  Benign, observe.    Pt defers cryotherapy at this time  Return in about 1 year (around 01/19/2022) for TBSE.  I, SOthelia Pulling RMA, am acting as scribe for TBrendolyn Patty MD .  Documentation: I have reviewed the above documentation for accuracy and completeness, and I agree with the above.  TBrendolyn PattyMD

## 2021-01-19 NOTE — Patient Instructions (Signed)

## 2021-01-20 ENCOUNTER — Ambulatory Visit: Payer: Medicare Other | Admitting: Internal Medicine

## 2021-01-21 ENCOUNTER — Ambulatory Visit: Payer: Medicare Other | Admitting: Internal Medicine

## 2021-01-23 ENCOUNTER — Ambulatory Visit (INDEPENDENT_AMBULATORY_CARE_PROVIDER_SITE_OTHER): Payer: Medicare Other | Admitting: Internal Medicine

## 2021-01-23 ENCOUNTER — Encounter: Payer: Self-pay | Admitting: Internal Medicine

## 2021-01-23 ENCOUNTER — Other Ambulatory Visit: Payer: Self-pay

## 2021-01-23 VITALS — BP 89/51 | HR 61 | Temp 98.2°F | Wt 158.0 lb

## 2021-01-23 DIAGNOSIS — R197 Diarrhea, unspecified: Secondary | ICD-10-CM | POA: Diagnosis not present

## 2021-01-23 DIAGNOSIS — I10 Essential (primary) hypertension: Secondary | ICD-10-CM | POA: Diagnosis not present

## 2021-01-23 DIAGNOSIS — R110 Nausea: Secondary | ICD-10-CM | POA: Diagnosis not present

## 2021-01-23 DIAGNOSIS — N390 Urinary tract infection, site not specified: Secondary | ICD-10-CM

## 2021-01-23 MED ORDER — ONDANSETRON 4 MG PO TBDP: 4.0000 mg | ORAL_TABLET | Freq: Three times a day (TID) | ORAL | 1 refills | Status: DC | PRN

## 2021-01-23 NOTE — Progress Notes (Signed)
LMP  (LMP Unknown)    Subjective:    Patient ID: Julia Deleon, female    DOB: 17-Mar-1946, 75 y.o.   MRN: [id number]  No chief complaint on file.   HPI: Julia Deleon is a 75 y.o. female  Pt is here for a fu, seen GI appreciate input. Continues to have some Nausea, diarrhea much improved.  [facility] saw pt for hiatal hernia. Per them it was minor and no surgery to fu with Dr. Hassell Done. W/u including EGD/ CSCOPE? Panc fecal elastase/ stool for h pylori all -ve per GI eval . To See Dr. Ola Spurr for a follow up, was put back on Doxycycline for bactreruria.   Had COVID end of march / 1st week of April. Says she feels she could have. Not taking low dose zofran. Had a kidney infection - from the middle of July - middle of august was seen by urology and they rx pt with macrobid - then doxycycline per pt she had some nausea.    Hypertension This is a chronic problem.  GI Problem The primary symptoms include nausea. Primary symptoms do not include fatigue, abdominal pain, vomiting, diarrhea, melena, hematemesis, jaundice, dysuria, myalgias or arthralgias. Primary symptoms comment: still has some nausea - not as bad as it was better hasnt gotten off of the diarrhea - colestipol per Dr. Darene Lamer.  The illness does not include anorexia, dysphagia, odynophagia, constipation or tenesmus.    No chief complaint on file.   Relevant past medical, surgical, family and social history reviewed and updated as indicated. Interim medical history since our last visit reviewed. Allergies and medications reviewed and updated.  Review of Systems  Constitutional:  Negative for fatigue.  Gastrointestinal:  Positive for nausea. Negative for abdominal pain, anorexia, constipation, diarrhea, dysphagia, hematemesis, jaundice, melena and vomiting.  Genitourinary:  Negative for dysuria.  Musculoskeletal:  Negative for arthralgias and myalgias.   Per HPI unless specifically indicated above     Objective:     LMP  (LMP Unknown)   Wt Readings from Last 3 Encounters:  01/06/21 158 lb (71.7 kg)  12/04/20 161 lb 12.8 oz (73.4 kg)  10/23/20 150 lb (68 kg)    Physical Exam Vitals and nursing note reviewed.  Constitutional:      General: She is not in acute distress.    Appearance: Normal appearance. She is not ill-appearing or diaphoretic.  Eyes:     Conjunctiva/sclera: Conjunctivae normal.  Pulmonary:     Breath sounds: No rhonchi.  Abdominal:     General: Abdomen is flat. Bowel sounds are normal. There is no distension.     Palpations: Abdomen is soft. There is no mass.     Tenderness: There is no abdominal tenderness. There is no guarding.  Skin:    General: Skin is warm and dry.     Coloration: Skin is not jaundiced.     Findings: No erythema.  Neurological:     Mental Status: She is alert.    Results for orders placed or performed during the hospital encounter of 10/29/20  I-STAT creatinine  Result Value Ref Range   Creatinine, Ser 1.30 (H) 0.44 - 1.00 mg/dL        Current Outpatient Medications:  .  acetaminophen (TYLENOL) 650 MG CR tablet, Take 650-1,300 mg by mouth every 8 (eight) hours as needed for pain. , Disp: , Rfl:  .  AMBULATORY NON FORMULARY MEDICATION, Progesterone 4% cream Apply 1/2 ml 6 nights on, 1 night off  NOTE DOSE CHANGE TO TRY TO WEAN OFF, Disp: 30 mL, Rfl: 5 .  AMBULATORY NON FORMULARY MEDICATION, Estriol vaginal cream 1 mg/gram Insert 0.5 mg 2-3 times weekly, Disp: 30 g, Rfl: 11 .  azelastine (ASTELIN) 0.1 % nasal spray, Place 1 spray into both nostrils daily. Use in each nostril as directed, Disp: , Rfl:  .  chlorzoxazone (PARAFON) 500 MG tablet, TAKE ONE TABLET FOUR TIMES DAILY AS NEEDED FOR MUSCLE SPASM, Disp: 120 tablet, Rfl: 1 .  colestipol (COLESTID) 1 g tablet, Take 2 tablets (2 g total) by mouth daily., Disp: 60 tablet, Rfl: 0 .  DHEA 10 MG TABS, Take by mouth., Disp: , Rfl:  .  EPINEPHrine 0.3 mg/0.3 mL IJ SOAJ injection, Inject 0.3 mg into the  muscle as needed for anaphylaxis., Disp: , Rfl:  .  estradiol (ESTRACE) 0.1 MG/GM vaginal cream, INSERT 0.5GM VAGINALLY 2-3 TIMES WEEKLY AS NEEDED, Disp: 42.5 g, Rfl: 1 .  fexofenadine (ALLEGRA) 60 MG tablet, Take 60 mg by mouth daily., Disp: , Rfl:  .  levothyroxine (SYNTHROID) 75 MCG tablet, TAKE 1 TABLET BY MOUTH EVERY MORNING BEFORE BREAKFAST, Disp: 90 tablet, Rfl: 0 .  losartan (COZAAR) 100 MG tablet, TAKE 1 TABLET BY MOUTH ONCE DAILY, Disp: 90 tablet, Rfl: 0 .  Lutein 20 MG CAPS, Take 1 capsule by mouth daily., Disp: , Rfl:  .  triamcinolone (NASACORT ALLERGY 24HR) 55 MCG/ACT AERO nasal inhaler, Place 2 sprays daily into the nose. (Patient taking differently: Place 2 sprays into the nose daily as needed (allergies).), Disp: 1 Inhaler, Rfl: 12 No current facility-administered medications for this visit.  Facility-Administered Medications Ordered in Other Visits:  .  sodium phosphate (FLEET) 7-19 GM/118ML enema 1 enema, 1 enema, Rectal, Once, Ardis Hughs, MD    Assessment & Plan:  Nausea : Low dose zofran Chewing giger chews, peppermint CT abdomen/ pelvis. IMPRESSION: 1. Slight heterogeneous enhancement of the renal parenchyma. Correlation with urinalysis recommended to exclude pyelonephritis. No abscess. 2. Sigmoid diverticulosis. No bowel obstruction. 3. Aortic Atherosclerosis (ICD10-I70.0).   2. Hiatal hernia fu with surgery.   3. Recurrent Uti - fu and mx per ID.  4. HTN : BP low one on off per pt. Will need to increasew water intake, to see Dr. Wanda Plump on Wednesday, will call office and let us know.  Cut back on losartan to 100 mg.  Continue current meds.  Medication compliance emphasised. pt advised to keep Bp logs. Pt verbalised understanding of the same. Pt to have a low salt diet . Exercise to reach a goal of at least 150 mins a week.  lifestyle modifications explained and pt understands importance of the above.  Problem List Items Addressed This Visit   None    No  orders of the defined types were placed in this encounter.    No orders of the defined types were placed in this encounter.    Follow up plan: No follow-ups on file.

## 2021-01-24 ENCOUNTER — Other Ambulatory Visit: Payer: Self-pay

## 2021-01-24 DIAGNOSIS — E785 Hyperlipidemia, unspecified: Secondary | ICD-10-CM

## 2021-01-24 DIAGNOSIS — I10 Essential (primary) hypertension: Secondary | ICD-10-CM

## 2021-01-24 DIAGNOSIS — Z1329 Encounter for screening for other suspected endocrine disorder: Secondary | ICD-10-CM

## 2021-01-28 DIAGNOSIS — N390 Urinary tract infection, site not specified: Secondary | ICD-10-CM | POA: Diagnosis not present

## 2021-01-28 DIAGNOSIS — R110 Nausea: Secondary | ICD-10-CM | POA: Diagnosis not present

## 2021-02-05 DIAGNOSIS — Z961 Presence of intraocular lens: Secondary | ICD-10-CM | POA: Diagnosis not present

## 2021-02-13 DIAGNOSIS — J301 Allergic rhinitis due to pollen: Secondary | ICD-10-CM | POA: Diagnosis not present

## 2021-02-19 DIAGNOSIS — J301 Allergic rhinitis due to pollen: Secondary | ICD-10-CM | POA: Diagnosis not present

## 2021-02-26 ENCOUNTER — Other Ambulatory Visit: Payer: Self-pay

## 2021-02-26 ENCOUNTER — Ambulatory Visit (INDEPENDENT_AMBULATORY_CARE_PROVIDER_SITE_OTHER): Payer: Medicare Other | Admitting: Gastroenterology

## 2021-02-26 ENCOUNTER — Encounter: Payer: Self-pay | Admitting: Gastroenterology

## 2021-02-26 VITALS — BP 194/67 | HR 64 | Temp 97.9°F | Wt 162.0 lb

## 2021-02-26 DIAGNOSIS — R195 Other fecal abnormalities: Secondary | ICD-10-CM

## 2021-02-26 DIAGNOSIS — R112 Nausea with vomiting, unspecified: Secondary | ICD-10-CM

## 2021-02-27 NOTE — Progress Notes (Signed)
Julia Antigua, MD [address]  [place]  [place], [place] [address]  Main: [phone]  Fax: [phone]   Primary Care Physician: Charlynne Cousins, MD   Chief complaint: Follow-up for nausea, diarrhea  HPI: Julia Deleon is a 75 y.o. female here for follow-up of nausea and diarrhea.  Patient states as of the last few weeks to months, her symptoms have completely resolved.  She is denying any further nausea.  Is reporting 1-2 soft bowel movements a day.  Feels that since she ha had recurrent UTIs, her symptoms were related to that and now that that has resolved, her nausea has improved.  In addition, her nausea was occurring when she was on an empty stomach and she is being more particular about ensuring that she eats a snack every 2-3 hours between meals.     ROS: All ROS reviewed and negative except as per HPI   Past Medical History:  Diagnosis Date   Arthritis    Chronic kidney disease    COPD (chronic obstructive pulmonary disease) ([facility])    pt denies    Dyspnea    Family history of adverse reaction to anesthesia    father was slow to wake up   Fuch's endothelial dystrophy    GERD (gastroesophageal reflux disease)    Hyperlipidemia    Hypertension    Hypothyroidism    Pneumonia    hx of    PONV (postoperative nausea and vomiting)    slow to wake up and PONV    Past Surgical History:  Procedure Laterality Date   ABDOMINAL HYSTERECTOMY     APPENDECTOMY     BACK SURGERY  09/26/2018   L4-5 PLIF by Dr. Arnoldo Morale   CARDIAC CATHETERIZATION     CHOLECYSTECTOMY     COLONOSCOPY WITH PROPOFOL N/A 10/23/2020   Procedure: COLONOSCOPY WITH PROPOFOL;  Surgeon: Virgel Manifold, MD;  Location: [facility] ENDOSCOPY;  Service: Endoscopy;  Laterality: N/A;   ESOPHAGOGASTRODUODENOSCOPY N/A 10/23/2020   Procedure: ESOPHAGOGASTRODUODENOSCOPY (EGD);  Surgeon: Virgel Manifold, MD;  Location: [facility] ENDOSCOPY;  Service: Endoscopy;  Laterality: N/A;   EYE SURGERY     FOOT  SURGERY Right    pin removed left   laser vein surgery     NASAL SINUS SURGERY     RIGHT/LEFT HEART CATH AND CORONARY ANGIOGRAPHY N/A 12/06/2017   Procedure: RIGHT/LEFT HEART CATH AND CORONARY ANGIOGRAPHY;  Surgeon: Yolonda Kida, MD;  Location: [facility];  Service: Cardiovascular;  Laterality: N/A;   ROBOTIC ASSISTED LAPAROSCOPIC SACROCOLPOPEXY Bilateral 07/30/2020   Procedure: XI ROBOTIC ASSISTED LAPAROSCOPIC SACROCOLPOPEXY AND SUPRACERVICAL HYSTERECTOMY WITH BILATERAL SALPINGO OOPHERECTOMY;  Surgeon: Ardis Hughs, MD;  Location: [facility];  Service: Urology;  Laterality: Bilateral;  REQUESTING 4 HRS   TONSILLECTOMY      Prior to Admission medications   Medication Sig Start Date End Date Taking? Authorizing Provider  acetaminophen (TYLENOL) 650 MG CR tablet Take 650-1,300 mg by mouth every 8 (eight) hours as needed for pain.    Yes [provider]  AMBULATORY NON FORMULARY MEDICATION Progesterone 4% cream Apply 1/2 ml 6 nights on, 1 night off NOTE DOSE CHANGE TO TRY TO WEAN OFF 09/09/52  Yes Copland, Alicia B, PA-C  AMBULATORY NON FORMULARY MEDICATION Estriol vaginal cream 1 mg/gram Insert 0.5 mg 2-3 times weekly 6/56/81  Yes Copland, Alicia B, PA-C  azelastine (ASTELIN) 0.1 % nasal spray Place 1 spray into both nostrils daily. Use in each nostril as directed  Yes [provider]  chlorzoxazone (PARAFON) 500 MG tablet TAKE ONE TABLET FOUR TIMES DAILY AS NEEDED FOR MUSCLE SPASM 10/09/20  Yes [name], Avanti, MD  DHEA 10 MG TABS Take by mouth.   Yes [provider]  doxycycline (VIBRAMYCIN) 100 MG capsule Take 100 mg by mouth 2 (two) times daily. 01/14/21  Yes [provider]  EPINEPHrine 0.3 mg/0.3 mL IJ SOAJ injection Inject 0.3 mg into the muscle as needed for anaphylaxis. 01/19/19  Yes [provider]  estradiol (ESTRACE) 0.1 MG/GM vaginal cream INSERT 0.5GM VAGINALLY 2-3 TIMES WEEKLY AS NEEDED 11/12/21  Yes Copland, Alicia B, PA-C   fexofenadine (ALLEGRA) 60 MG tablet Take 60 mg by mouth daily.   Yes [provider]  levothyroxine (SYNTHROID) 75 MCG tablet TAKE 1 TABLET BY MOUTH EVERY MORNING BEFORE BREAKFAST 12/18/20  Yes [name], Avanti, MD  losartan (COZAAR) 100 MG tablet TAKE 1 TABLET BY MOUTH ONCE DAILY 12/18/20  Yes [name], Avanti, MD  Lutein 20 MG CAPS Take 1 capsule by mouth daily.   Yes [provider]  ondansetron (ZOFRAN ODT) 4 MG disintegrating tablet Take 1 tablet (4 mg total) by mouth every 8 (eight) hours as needed for nausea or vomiting. Use every other day 01/23/21  Yes [name], Avanti, MD  triamcinolone (NASACORT ALLERGY 24HR) 55 MCG/ACT AERO nasal inhaler Place 2 sprays daily into the nose. Patient taking differently: Place 2 sprays into the nose daily as needed (allergies). 03/28/17  Yes Guadalupe Maple, MD  Turmeric POWD Take by mouth.   Yes [provider]  colestipol (COLESTID) 1 g tablet Take 2 tablets (2 g total) by mouth daily. 10/30/20 11/29/20  Virgel Manifold, MD    Family History  Problem Relation Age of Onset   Cancer Mother        lung   CAD Mother    Stroke Father    Breast cancer Sister        65s   Fibromyalgia Sister    Brain cancer Sister    Heart disease Maternal Grandmother    Cancer Paternal Grandmother        throat   Emphysema Paternal Grandfather    Varicose Veins Son    Breast cancer Other      Social History   Tobacco Use   Smoking status: Never   Smokeless tobacco: Never  Vaping Use   Vaping Use: Never used  Substance Use Topics   Alcohol use: No    Alcohol/week: 0.0 standard drinks   Drug use: No    Allergies as of 02/26/2021 - Review Complete 01/23/2021  Allergen Reaction Noted   Colesevelam Nausea And Vomiting 12/09/2016   Levofloxacin Hives 06/12/2015   Amoxicillin  07/14/2020   Benazepril Other (See Comments) 06/04/2013   Clavulanic acid Other (See Comments) 10/22/2020   Methylisothiazolinone Other (See Comments) 12/12/2017    Thimerosal Other (See Comments) 12/12/2017   Amoxicillin-pot clavulanate Nausea Only 11/27/2014   Latex Rash 11/29/2017   Simvastatin Itching 11/26/2014    Physical Examination:  Constitutional: General:   Alert,  Well-developed, well-nourished, pleasant and cooperative in NAD BP (!) 194/67   Pulse 64   Temp 97.9 F (36.6 C) (Oral)   Wt 162 lb (73.5 kg)   LMP  (LMP Unknown)   BMI 27.81 kg/m   Respiratory: Normal respiratory effort  Gastrointestinal:  Soft, non-tender and non-distended without masses, hepatosplenomegaly or hernias noted.  No guarding or rebound tenderness.     Cardiac: No clubbing or  edema.  No cyanosis. Normal posterior tibial pedal pulses noted.  Psych:  Alert and cooperative. Normal mood and affect.  Musculoskeletal:  Normal gait. Head normocephalic, atraumatic. Symmetrical without gross deformities. 5/5 Lower extremity strength bilaterally.  Skin: Warm. Intact without significant lesions or rashes. No jaundice.  Neck: Supple, trachea midline  Lymph: No cervical lymphadenopathy  Psych:  Alert and oriented x3, Alert and cooperative. Normal mood and affect.  Labs: CMP     Component Value Date/Time   NA 138 09/24/2020 1520   K 4.8 09/24/2020 1520   CL 102 09/24/2020 1520   CO2 19 (L) 09/24/2020 1520   GLUCOSE 84 09/24/2020 1520   GLUCOSE 142 (H) 07/31/2020 0341   BUN 29 (H) 09/24/2020 1520   CREATININE 1.30 (H) 10/29/2020 0951   CALCIUM 9.3 09/24/2020 1520   PROT 6.2 09/19/2020 1549   ALBUMIN 3.9 09/19/2020 1549   AST 16 09/19/2020 1549   AST 25 10/17/2017 0937   ALT 15 09/19/2020 1549   ALT 26 10/17/2017 0937   ALKPHOS 58 09/19/2020 1549   BILITOT 0.3 09/19/2020 1549   GFRNONAA 54 (L) 07/31/2020 0341   GFRAA 56 (L) 04/11/2020 0924   Lab Results  Component Value Date   WBC 9.2 09/19/2020   HGB 12.0 09/19/2020   HCT 35.3 09/19/2020   MCV 94 09/19/2020   PLT 218 09/19/2020    Imaging Studies:   Assessment and Plan:   Julia Deleon is a 75 y.o. y/o female here for follow-up of nausea and diarrhea  Her symptoms have completely resolved at this time.  She attributes her nausea to her recurrent UTIs and after her most recent course of antibiotics for UTI, her nausea symptoms have improved as well.  In addition, she is ensuring that she eats snacks every 2-3 hours as being on an empty stomach was causing her nausea as well.  She is only having 1-2 formed bowel movements a day at this point.  Other work-up including CT abdomen pelvis in [place] 2022, H. pylori stool antigen, GI profile, fecal pancreatic elastase, EGD and colonoscopy all reassuring.  No further work-up needed at this point since symptoms has resolved and above work-up has been completed  However, if symptoms recur, patient advised to call us and she verbalized understanding  Follow-up in 6 months or earlier if symptoms recur  Dr Julia Deleon

## 2021-03-03 DIAGNOSIS — Z780 Asymptomatic menopausal state: Secondary | ICD-10-CM | POA: Diagnosis not present

## 2021-03-03 DIAGNOSIS — R635 Abnormal weight gain: Secondary | ICD-10-CM | POA: Diagnosis not present

## 2021-03-03 DIAGNOSIS — Z90710 Acquired absence of both cervix and uterus: Secondary | ICD-10-CM | POA: Diagnosis not present

## 2021-03-04 DIAGNOSIS — E039 Hypothyroidism, unspecified: Secondary | ICD-10-CM | POA: Diagnosis not present

## 2021-03-04 DIAGNOSIS — N951 Menopausal and female climacteric states: Secondary | ICD-10-CM | POA: Diagnosis not present

## 2021-03-10 DIAGNOSIS — F329 Major depressive disorder, single episode, unspecified: Secondary | ICD-10-CM | POA: Diagnosis not present

## 2021-03-10 DIAGNOSIS — Z6828 Body mass index (BMI) 28.0-28.9, adult: Secondary | ICD-10-CM | POA: Diagnosis not present

## 2021-03-10 DIAGNOSIS — R454 Irritability and anger: Secondary | ICD-10-CM | POA: Diagnosis not present

## 2021-03-10 DIAGNOSIS — M2550 Pain in unspecified joint: Secondary | ICD-10-CM | POA: Diagnosis not present

## 2021-03-10 DIAGNOSIS — R5383 Other fatigue: Secondary | ICD-10-CM | POA: Diagnosis not present

## 2021-03-10 DIAGNOSIS — G4700 Insomnia, unspecified: Secondary | ICD-10-CM | POA: Diagnosis not present

## 2021-03-10 DIAGNOSIS — I10 Essential (primary) hypertension: Secondary | ICD-10-CM | POA: Diagnosis not present

## 2021-03-10 DIAGNOSIS — N951 Menopausal and female climacteric states: Secondary | ICD-10-CM | POA: Diagnosis not present

## 2021-03-10 DIAGNOSIS — E039 Hypothyroidism, unspecified: Secondary | ICD-10-CM | POA: Diagnosis not present

## 2021-03-21 ENCOUNTER — Other Ambulatory Visit: Payer: Self-pay | Admitting: Internal Medicine

## 2021-03-21 DIAGNOSIS — I10 Essential (primary) hypertension: Secondary | ICD-10-CM

## 2021-03-21 NOTE — Telephone Encounter (Signed)
Requested Prescriptions  Pending Prescriptions Disp Refills  . losartan (COZAAR) 100 MG tablet [Pharmacy Med Name: LOSARTAN POTASSIUM 100 MG TAB] 90 tablet 0    Sig: TAKE 1 TABLET BY MOUTH ONCE DAILY     Cardiovascular:  Angiotensin Receptor Blockers Failed - 03/21/2021  8:49 AM      Failed - Cr in normal range and within 180 days    Creatinine, Ser  Date Value Ref Range Status  10/29/2020 1.30 (H) 0.44 - 1.00 mg/dL Final         Failed - Last BP in normal range    BP Readings from Last 1 Encounters:  02/26/21 (!) 194/67         Passed - K in normal range and within 180 days    Potassium  Date Value Ref Range Status  09/24/2020 4.8 3.5 - 5.2 mmol/L Final         Passed - Patient is not pregnant      Passed - Valid encounter within last 6 months    Recent Outpatient Visits          1 month ago Diarrhea, unspecified type   [facility] [name], Avanti, MD   5 months ago B12 deficiency   [place] [name], Avanti, MD   6 months ago Epigastric pain   [facility] McElwee, Lauren A, NP   11 months ago Other specified hypothyroidism   [employer], [place], DO   1 year ago Other specified hypothyroidism   [facility] Johnson, [place], DO      Future Appointments            In 2 weeks  [place], [place]   In 1 month [name], Avanti, MD [facility], [place]   In 10 months Ralene Bathe, MD [place]           . levothyroxine (SYNTHROID) 75 MCG tablet [Pharmacy Med Name: LEVOTHYROXINE SODIUM 75 MCG TAB] 90 tablet 2    Sig: TAKE 1 TABLET BY MOUTH EVERY MORNING BEFORE BREAKFAST     Endocrinology:  Hypothyroid Agents Failed - 03/21/2021  8:49 AM      Failed - TSH needs to be rechecked within 3 months after an abnormal result. Refill until TSH is due.      Passed - TSH in normal range and within 360 days    TSH  Date Value Ref Range Status  09/19/2020 2.020 0.450 - 4.500  uIU/mL Final         Passed - Valid encounter within last 12 months    Recent Outpatient Visits          1 month ago Diarrhea, unspecified type   [facility] [name], Avanti, MD   5 months ago B12 deficiency   [place] [name], Avanti, MD   6 months ago Epigastric pain   [facility] McElwee, Lauren A, NP   11 months ago Other specified hypothyroidism   [employer], [place], DO   1 year ago Other specified hypothyroidism   [facility] Johnson, [place], DO      Future Appointments            In 2 weeks  [place], [place]   In 1 month [name], Avanti, MD [facility], [place]   In 10 months Ralene Bathe, MD [place]

## 2021-03-25 DIAGNOSIS — Z23 Encounter for immunization: Secondary | ICD-10-CM | POA: Diagnosis not present

## 2021-04-08 ENCOUNTER — Ambulatory Visit (INDEPENDENT_AMBULATORY_CARE_PROVIDER_SITE_OTHER): Payer: Medicare Other | Admitting: *Deleted

## 2021-04-08 DIAGNOSIS — Z0000 Encounter for general adult medical examination without abnormal findings: Secondary | ICD-10-CM

## 2021-04-08 NOTE — Progress Notes (Signed)
Subjective:   Julia Deleon is a 75 y.o. female who presents for Medicare Annual (Subsequent) preventive examination.  I connected with  Julia Deleon on 04/08/21 by a telephone enabled telemedicine application and verified that I am speaking with the correct person using two identifiers.   I discussed the limitations of evaluation and management by telemedicine. The patient expressed understanding and agreed to proceed.  Patient location: home  Provider location:  [place] not in office    Review of Systems     Cardiac Risk Factors include: advanced age (>64men, >78 women);hypertension     Objective:    Today's Vitals   There is no height or weight on file to calculate BMI.  Advanced Directives 04/08/2021 10/23/2020 07/30/2020 07/30/2020 07/24/2020 04/07/2020 01/22/2020  Does Patient Have a Medical Advance Directive? No No No No No No No  Does patient want to make changes to medical advance directive? - - - - - - -  Would patient like information on creating a medical advance directive? No - Patient declined No - Patient declined No - Guardian declined No - Patient declined - - -    Current Medications (verified) Outpatient Encounter Medications as of 04/08/2021  Medication Sig   acetaminophen (TYLENOL) 650 MG CR tablet Take 650-1,300 mg by mouth every 8 (eight) hours as needed for pain.    AMBULATORY NON FORMULARY MEDICATION Progesterone 4% cream Apply 1/2 ml 6 nights on, 1 night off NOTE DOSE CHANGE TO TRY TO WEAN OFF   AMBULATORY NON FORMULARY MEDICATION Estriol vaginal cream 1 mg/gram Insert 0.5 mg 2-3 times weekly   azelastine (ASTELIN) 0.1 % nasal spray Place 1 spray into both nostrils daily. Use in each nostril as directed   chlorzoxazone (PARAFON) 500 MG tablet TAKE ONE TABLET FOUR TIMES DAILY AS NEEDED FOR MUSCLE SPASM   DHEA 10 MG TABS Take by mouth.   doxycycline (VIBRAMYCIN) 100 MG capsule Take 100 mg by mouth 2 (two) times daily.   EPINEPHrine 0.3 mg/0.3 mL IJ  SOAJ injection Inject 0.3 mg into the muscle as needed for anaphylaxis.   estradiol (ESTRACE) 0.1 MG/GM vaginal cream INSERT 0.5GM VAGINALLY 2-3 TIMES WEEKLY AS NEEDED   fexofenadine (ALLEGRA) 60 MG tablet Take 60 mg by mouth daily.   levothyroxine (SYNTHROID) 75 MCG tablet TAKE 1 TABLET BY MOUTH EVERY MORNING BEFORE BREAKFAST   losartan (COZAAR) 100 MG tablet TAKE 1 TABLET BY MOUTH ONCE DAILY   Lutein 20 MG CAPS Take 1 capsule by mouth daily.   ondansetron (ZOFRAN ODT) 4 MG disintegrating tablet Take 1 tablet (4 mg total) by mouth every 8 (eight) hours as needed for nausea or vomiting. Use every other day   triamcinolone (NASACORT ALLERGY 24HR) 55 MCG/ACT AERO nasal inhaler Place 2 sprays daily into the nose. (Patient taking differently: Place 2 sprays into the nose daily as needed (allergies).)   Turmeric POWD Take by mouth.   colestipol (COLESTID) 1 g tablet Take 2 tablets (2 g total) by mouth daily.   Facility-Administered Encounter Medications as of 04/08/2021  Medication   sodium phosphate (FLEET) 7-19 GM/118ML enema 1 enema    Allergies (verified) Colesevelam, Levofloxacin, Amoxicillin, Benazepril, Clavulanic acid, Methylisothiazolinone, Thimerosal, Amoxicillin-pot clavulanate, Latex, and Simvastatin   History: Past Medical History:  Diagnosis Date   Arthritis    Chronic kidney disease    COPD (chronic obstructive pulmonary disease) ([place])    pt denies    Dyspnea    Family history of adverse reaction  to anesthesia    father was slow to wake up   Fuch's endothelial dystrophy    GERD (gastroesophageal reflux disease)    Hyperlipidemia    Hypertension    Hypothyroidism    Pneumonia    hx of    PONV (postoperative nausea and vomiting)    slow to wake up and PONV   Past Surgical History:  Procedure Laterality Date   ABDOMINAL HYSTERECTOMY     APPENDECTOMY     BACK SURGERY  09/26/2018   L4-5 PLIF by Dr. Arnoldo Morale   CARDIAC CATHETERIZATION     CHOLECYSTECTOMY      COLONOSCOPY WITH PROPOFOL N/A 10/23/2020   Procedure: COLONOSCOPY WITH PROPOFOL;  Surgeon: Virgel Manifold, MD;  Location: [facility] ENDOSCOPY;  Service: Endoscopy;  Laterality: N/A;   ESOPHAGOGASTRODUODENOSCOPY N/A 10/23/2020   Procedure: ESOPHAGOGASTRODUODENOSCOPY (EGD);  Surgeon: Virgel Manifold, MD;  Location: [facility] ENDOSCOPY;  Service: Endoscopy;  Laterality: N/A;   EYE SURGERY     FOOT SURGERY Right    pin removed left   laser vein surgery     NASAL SINUS SURGERY     RIGHT/LEFT HEART CATH AND CORONARY ANGIOGRAPHY N/A 12/06/2017   Procedure: RIGHT/LEFT HEART CATH AND CORONARY ANGIOGRAPHY;  Surgeon: Yolonda Kida, MD;  Location: [facility];  Service: Cardiovascular;  Laterality: N/A;   ROBOTIC ASSISTED LAPAROSCOPIC SACROCOLPOPEXY Bilateral 07/30/2020   Procedure: XI ROBOTIC ASSISTED LAPAROSCOPIC SACROCOLPOPEXY AND SUPRACERVICAL HYSTERECTOMY WITH BILATERAL SALPINGO OOPHERECTOMY;  Surgeon: Ardis Hughs, MD;  Location: [facility];  Service: Urology;  Laterality: Bilateral;  REQUESTING 4 HRS   TONSILLECTOMY     Family History  Problem Relation Age of Onset   Cancer Mother        lung   CAD Mother    Stroke Father    Breast cancer Sister        71s   Fibromyalgia Sister    Brain cancer Sister    Heart disease Maternal Grandmother    Cancer Paternal Grandmother        throat   Emphysema Paternal Grandfather    Varicose Veins Son    Breast cancer Other    Social History   Socioeconomic History   Marital status: Married    Spouse name: Not on file   Number of children: Not on file   Years of education: 4   Highest education level: Bachelor's degree (e.g., BA, AB, BS)  Occupational History   Occupation: retired  Tobacco Use   Smoking status: Never   Smokeless tobacco: Never  Vaping Use   Vaping Use: Never used  Substance and Sexual Activity   Alcohol use: No    Alcohol/week: 0.0 standard drinks   Drug use: No   Sexual activity: Not Currently    Birth  control/protection: Post-menopausal  Other Topics Concern   Not on file  Social History Narrative   Not on file   Social Determinants of Health   Financial Resource Strain: Low Risk    Difficulty of Paying Living Expenses: Not hard at all  Food Insecurity: No Food Insecurity   Worried About Charity fundraiser in the Last Year: Never true   [place] in the Last Year: Never true  Transportation Needs: No Transportation Needs   Lack of Transportation (Medical): No   Lack of Transportation (Non-Medical): No  Physical Activity: Insufficiently Active   Days of Exercise per Week: 4 days   Minutes of Exercise per Session: 30 min  Stress: No Stress Concern Present   Feeling of Stress : Not at all  Social Connections: Socially Integrated   Frequency of Communication with Friends and Family: More than three times a week   Frequency of Social Gatherings with Friends and Family: More than three times a week   Attends Religious Services: More than 4 times per year   Active Member of [employer] or Organizations: Yes   Attends Music therapist: More than 4 times per year   Marital Status: Married    Tobacco Counseling Counseling given: Not Answered   Clinical Intake:  Pre-visit preparation completed: Yes  Pain : No/denies pain     Nutritional Risks: None Diabetes: No  How often do you need to have someone help you when you read instructions, pamphlets, or other written materials from your doctor or pharmacy?: 1 - Never  Diabetic?  no  Interpreter Needed?: No  Information entered by :: Leroy Kennedy LPN   Activities of Daily Living In your present state of health, do you have any difficulty performing the following activities: 04/08/2021 07/30/2020  Hearing? N -  Vision? N -  Difficulty concentrating or making decisions? N -  Walking or climbing stairs? N -  Dressing or bathing? N -  Doing errands, shopping? N N  Preparing Food and eating ? N -  In the past  six months, have you accidently leaked urine? Y -  Comment bladder tac, pads -  Do you have problems with loss of bowel control? N -  Managing your Medications? N -  Managing your Finances? N -  Some recent data might be hidden    Patient Care Team: Charlynne Cousins, MD as PCP - General (Internal Medicine)  Indicate any recent Medical Services you may have received from other than [facility] providers in the past year (date may be approximate).     Assessment:   This is a routine wellness examination for [place].  Hearing/Vision screen Hearing Screening - Comments:: No trouble hearing Vision Screening - Comments:: Up to date Dr. Wallace Going  Dietary issues and exercise activities discussed: Current Exercise Habits: Structured exercise class, Type of exercise: strength training/weights, Time (Minutes): 30, Frequency (Times/Week): 4, Weekly Exercise (Minutes/Week): 120, Intensity: Mild   Goals Addressed             This Visit's Progress    Patient Stated       Stay healthy       Depression Screen PHQ 2/9 Scores 04/08/2021 09/24/2020 04/07/2020 04/03/2019 03/30/2018 06/06/2017 03/28/2017  PHQ - 2 Score 0 0 0 0 0 1 1  PHQ- 9 Score - - - 3 - - 3    Fall Risk Fall Risk  04/08/2021 09/24/2020 04/07/2020 04/03/2019 03/30/2018  Falls in the past year? 0 0 0 0 1  Number falls in past yr: 0 0 - 0 1  Injury with Fall? 0 0 - 0 0  Risk for fall due to : - - Medication side effect - -  Follow up Falls evaluation completed;Falls prevention discussed Falls evaluation completed Falls evaluation completed;Education provided;Falls prevention discussed - -    FALL RISK PREVENTION PERTAINING TO THE HOME:  Any stairs in or around the home? Yes  If so, are there any without handrails? No  Home free of loose throw rugs in walkways, pet beds, electrical cords, etc? Yes  Adequate lighting in your home to reduce risk of falls? Yes   ASSISTIVE DEVICES UTILIZED TO PREVENT FALLS:  Life alert? No  Use  of a cane, walker or w/c? No  Grab bars in the bathroom? No  Shower chair or bench in shower? No  Elevated toilet seat or a handicapped toilet? No   TIMED UP AND GO:  Was the test performed? No .    Cognitive Function:  Normal cognitive status assessed by direct observation by this Nurse Health Advisor. No abnormalities found.       6CIT Screen 04/07/2020 04/03/2019 03/30/2018 03/28/2017  What Year? 0 points 0 points 0 points 0 points  What month? 0 points 0 points 0 points 0 points  What time? 0 points 0 points 0 points 0 points  Count back from 20 0 points 0 points 0 points 0 points  Months in reverse 0 points 0 points 0 points 0 points  Repeat phrase 0 points 0 points 0 points 0 points  Total Score 0 0 0 0    Immunizations Immunization History  Administered Date(s) Administered   Fluad Quad(high Dose 65+) 02/07/2020   Influenza, High Dose Seasonal PF 03/25/2016   Influenza,inj,Quad PF,6+ Mos 03/25/2015, 06/02/2018   Influenza-Unspecified 02/22/2019, 03/25/2021   Moderna Sars-Covid-2 Vaccination 06/03/2019, 07/02/2019, 03/21/2020   Pneumococcal Conjugate-13 02/13/2014   Pneumococcal Polysaccharide-23 12/09/2016   Td 04/07/2020   Tdap 07/21/2010   Zoster, Live 05/07/2009    TDAP status: Up to date  Flu Vaccine status: Due, Education has been provided regarding the importance of this vaccine. Advised may receive this vaccine at local pharmacy or Health Dept. Aware to provide a copy of the vaccination record if obtained from local pharmacy or Health Dept. Verbalized acceptance and understanding.  Pneumococcal vaccine status: Up to date  Covid-19 vaccine status: Information provided on how to obtain vaccines.   Qualifies for Shingles Vaccine? Yes   Zostavax completed No   Shingrix Completed?: Yes  Screening Tests Health Maintenance  Topic Date Due   Zoster Vaccines- Shingrix (1 of 2) Never done   COVID-19 Vaccine (4 - Booster for Moderna series) 05/16/2020    COLONOSCOPY (Pts 45-59yrs Insurance coverage will need to be confirmed)  10/23/2021   MAMMOGRAM  12/17/2022   TETANUS/TDAP  04/07/2030   Pneumonia Vaccine 40+ Years old  Completed   INFLUENZA VACCINE  Completed   DEXA SCAN  Completed   Hepatitis C Screening  Completed   HPV VACCINES  Aged Out    Health Maintenance  Health Maintenance Due  Topic Date Due   Zoster Vaccines- Shingrix (1 of 2) Never done   COVID-19 Vaccine (4 - Booster for Moderna series) 05/16/2020    Colorectal cancer screening: Type of screening: Colonoscopy. Completed 2022. Repeat every 1 years  Mammogram status: Completed  . Repeat every year  Bone Density status: Ordered  . Pt provided with contact info and advised to call to schedule appt.  Lung Cancer Screening: (Low Dose CT Chest recommended if Age 52-80 years, 30 pack-year currently smoking OR have quit w/in 15years.) does not qualify.   Lung Cancer Screening Referral:   Additional Screening:  Hepatitis C Screening: does not qualify; Completed 2017  Vision Screening: Recommended annual ophthalmology exams for early detection of glaucoma and other disorders of the eye. Is the patient up to date with their annual eye exam?  Yes  Who is the provider or what is the name of the office in which the patient attends annual eye exams? [facility] If pt is not established with a provider, would they like to be referred to a provider to establish care? No .  Dental Screening: Recommended annual dental exams for proper oral hygiene  Community Resource Referral / Chronic Care Management: CRR required this visit?  No   CCM required this visit?  No      Plan:     I have personally reviewed and noted the following in the patient's chart:   Medical and social history Use of alcohol, tobacco or illicit drugs  Current medications and supplements including opioid prescriptions.  Functional ability and status Nutritional status Physical activity Advanced  directives List of other physicians Hospitalizations, surgeries, and ER visits in previous 12 months Vitals Screenings to include cognitive, depression, and falls Referrals and appointments  In addition, I have reviewed and discussed with patient certain preventive protocols, quality metrics, and best practice recommendations. A written personalized care plan for preventive services as well as general preventive health recommendations were provided to patient.     Leroy Kennedy, LPN   02/72/[id number]   Nurse Notes:

## 2021-04-08 NOTE — Patient Instructions (Signed)
Julia Deleon , Thank you for taking time to come for your Medicare Wellness Visit. I appreciate your ongoing commitment to your health goals. Please review the following plan we discussed and let me know if I can assist you in the future.   Screening recommendations/referrals: Colonoscopy: up to date Mammogram: up to date Bone Density: Education provided  Recommended yearly ophthalmology/optometry visit for glaucoma screening and checkup Recommended yearly dental visit for hygiene and checkup  Vaccinations: Influenza vaccine: up to date Pneumococcal vaccine: up to date Tdap vaccine: up to date Shingles vaccine: Education provided    Advanced directives: Education provided  Conditions/risks identified:   Next appointment: 04-24-2021 @ 11:00  [name]   Preventive Care 34 Years and Older, Female Preventive care refers to lifestyle choices and visits with your health care provider that can promote health and wellness. What does preventive care include? A yearly physical exam. This is also called an annual well check. Dental exams once or twice a year. Routine eye exams. Ask your health care provider how often you should have your eyes checked. Personal lifestyle choices, including: Daily care of your teeth and gums. Regular physical activity. Eating a healthy diet. Avoiding tobacco and drug use. Limiting alcohol use. Practicing safe sex. Taking low-dose aspirin every day. Taking vitamin and mineral supplements as recommended by your health care provider. What happens during an annual well check? The services and screenings done by your health care provider during your annual well check will depend on your age, overall health, lifestyle risk factors, and family history of disease. Counseling  Your health care provider may ask you questions about your: Alcohol use. Tobacco use. Drug use. Emotional well-being. Home and relationship well-being. Sexual activity. Eating  habits. History of falls. Memory and ability to understand (cognition). Work and work Statistician. Reproductive health. Screening  You may have the following tests or measurements: Height, weight, and BMI. Blood pressure. Lipid and cholesterol levels. These may be checked every 5 years, or more frequently if you are over 79 years old. Skin check. Lung cancer screening. You may have this screening every year starting at age 83 if you have a 30-pack-year history of smoking and currently smoke or have quit within the past 15 years. Fecal occult blood test (FOBT) of the stool. You may have this test every year starting at age 89. Flexible sigmoidoscopy or colonoscopy. You may have a sigmoidoscopy every 5 years or a colonoscopy every 10 years starting at age 17. Hepatitis C blood test. Hepatitis B blood test. Sexually transmitted disease (STD) testing. Diabetes screening. This is done by checking your blood sugar (glucose) after you have not eaten for a while (fasting). You may have this done every 1-3 years. Bone density scan. This is done to screen for osteoporosis. You may have this done starting at age 71. Mammogram. This may be done every 1-2 years. Talk to your health care provider about how often you should have regular mammograms. Talk with your health care provider about your test results, treatment options, and if necessary, the need for more tests. Vaccines  Your health care provider may recommend certain vaccines, such as: Influenza vaccine. This is recommended every year. Tetanus, diphtheria, and acellular pertussis (Tdap, Td) vaccine. You may need a Td booster every 10 years. Zoster vaccine. You may need this after age 65. Pneumococcal 13-valent conjugate (PCV13) vaccine. One dose is recommended after age 40. Pneumococcal polysaccharide (PPSV23) vaccine. One dose is recommended after age 2. Talk to your health care  provider about which screenings and vaccines you need and how  often you need them. This information is not intended to replace advice given to you by your health care provider. Make sure you discuss any questions you have with your health care provider. Document Released: 05/23/2015 Document Revised: 01/14/2016 Document Reviewed: 02/25/2015 Elsevier Interactive Patient Education  2017 [place] Prevention in the Home Falls can cause injuries. They can happen to people of all ages. There are many things you can do to make your home safe and to help prevent falls. What can I do on the outside of my home? Regularly fix the edges of walkways and driveways and fix any cracks. Remove anything that might make you trip as you walk through a door, such as a raised step or threshold. Trim any bushes or trees on the path to your home. Use bright outdoor lighting. Clear any walking paths of anything that might make someone trip, such as rocks or tools. Regularly check to see if handrails are loose or broken. Make sure that both sides of any steps have handrails. Any raised decks and porches should have guardrails on the edges. Have any leaves, snow, or ice cleared regularly. Use sand or salt on walking paths during winter. Clean up any spills in your garage right away. This includes oil or grease spills. What can I do in the bathroom? Use night lights. Install grab bars by the toilet and in the tub and shower. Do not use towel bars as grab bars. Use non-skid mats or decals in the tub or shower. If you need to sit down in the shower, use a plastic, non-slip stool. Keep the floor dry. Clean up any water that spills on the floor as soon as it happens. Remove soap buildup in the tub or shower regularly. Attach bath mats securely with double-sided non-slip rug tape. Do not have throw rugs and other things on the floor that can make you trip. What can I do in the bedroom? Use night lights. Make sure that you have a light by your bed that is easy to  reach. Do not use any sheets or blankets that are too big for your bed. They should not hang down onto the floor. Have a firm chair that has side arms. You can use this for support while you get dressed. Do not have throw rugs and other things on the floor that can make you trip. What can I do in the kitchen? Clean up any spills right away. Avoid walking on wet floors. Keep items that you use a lot in easy-to-reach places. If you need to reach something above you, use a strong step stool that has a grab bar. Keep electrical cords out of the way. Do not use floor polish or wax that makes floors slippery. If you must use wax, use non-skid floor wax. Do not have throw rugs and other things on the floor that can make you trip. What can I do with my stairs? Do not leave any items on the stairs. Make sure that there are handrails on both sides of the stairs and use them. Fix handrails that are broken or loose. Make sure that handrails are as long as the stairways. Check any carpeting to make sure that it is firmly attached to the stairs. Fix any carpet that is loose or worn. Avoid having throw rugs at the top or bottom of the stairs. If you do have throw rugs, attach them to the  floor with carpet tape. Make sure that you have a light switch at the top of the stairs and the bottom of the stairs. If you do not have them, ask someone to add them for you. What else can I do to help prevent falls? Wear shoes that: Do not have high heels. Have rubber bottoms. Are comfortable and fit you well. Are closed at the toe. Do not wear sandals. If you use a stepladder: Make sure that it is fully opened. Do not climb a closed stepladder. Make sure that both sides of the stepladder are locked into place. Ask someone to hold it for you, if possible. Clearly mark and make sure that you can see: Any grab bars or handrails. First and last steps. Where the edge of each step is. Use tools that help you move  around (mobility aids) if they are needed. These include: Canes. Walkers. Scooters. Crutches. Turn on the lights when you go into a dark area. Replace any light bulbs as soon as they burn out. Set up your furniture so you have a clear path. Avoid moving your furniture around. If any of your floors are uneven, fix them. If there are any pets around you, be aware of where they are. Review your medicines with your doctor. Some medicines can make you feel dizzy. This can increase your chance of falling. Ask your doctor what other things that you can do to help prevent falls. This information is not intended to replace advice given to you by your health care provider. Make sure you discuss any questions you have with your health care provider. Document Released: 02/20/2009 Document Revised: 10/02/2015 Document Reviewed: 05/31/2014 Elsevier Interactive Patient Education  2017 [employer].

## 2021-04-09 DIAGNOSIS — E782 Mixed hyperlipidemia: Secondary | ICD-10-CM | POA: Diagnosis not present

## 2021-04-09 DIAGNOSIS — I10 Essential (primary) hypertension: Secondary | ICD-10-CM | POA: Diagnosis not present

## 2021-04-09 DIAGNOSIS — I83893 Varicose veins of bilateral lower extremities with other complications: Secondary | ICD-10-CM | POA: Diagnosis not present

## 2021-04-09 DIAGNOSIS — Z7689 Persons encountering health services in other specified circumstances: Secondary | ICD-10-CM | POA: Diagnosis not present

## 2021-04-09 DIAGNOSIS — R600 Localized edema: Secondary | ICD-10-CM | POA: Diagnosis not present

## 2021-04-09 DIAGNOSIS — E6609 Other obesity due to excess calories: Secondary | ICD-10-CM | POA: Diagnosis not present

## 2021-04-09 DIAGNOSIS — N189 Chronic kidney disease, unspecified: Secondary | ICD-10-CM | POA: Diagnosis not present

## 2021-04-09 DIAGNOSIS — N183 Chronic kidney disease, stage 3 unspecified: Secondary | ICD-10-CM | POA: Diagnosis not present

## 2021-04-09 DIAGNOSIS — J439 Emphysema, unspecified: Secondary | ICD-10-CM | POA: Diagnosis not present

## 2021-04-09 DIAGNOSIS — I208 Other forms of angina pectoris: Secondary | ICD-10-CM | POA: Diagnosis not present

## 2021-04-09 DIAGNOSIS — R5383 Other fatigue: Secondary | ICD-10-CM | POA: Diagnosis not present

## 2021-04-09 DIAGNOSIS — R0602 Shortness of breath: Secondary | ICD-10-CM | POA: Diagnosis not present

## 2021-04-13 ENCOUNTER — Other Ambulatory Visit: Payer: Medicare Other

## 2021-04-13 ENCOUNTER — Other Ambulatory Visit: Payer: Self-pay

## 2021-04-13 DIAGNOSIS — E785 Hyperlipidemia, unspecified: Secondary | ICD-10-CM

## 2021-04-13 DIAGNOSIS — Z1329 Encounter for screening for other suspected endocrine disorder: Secondary | ICD-10-CM

## 2021-04-13 DIAGNOSIS — I10 Essential (primary) hypertension: Secondary | ICD-10-CM | POA: Diagnosis not present

## 2021-04-13 DIAGNOSIS — N183 Chronic kidney disease, stage 3 unspecified: Secondary | ICD-10-CM

## 2021-04-13 DIAGNOSIS — E038 Other specified hypothyroidism: Secondary | ICD-10-CM | POA: Diagnosis not present

## 2021-04-13 LAB — BAYER DCA HB A1C WAIVED: HB A1C (BAYER DCA - WAIVED): 4.9 % (ref 4.8–5.6)

## 2021-04-14 DIAGNOSIS — N951 Menopausal and female climacteric states: Secondary | ICD-10-CM | POA: Diagnosis not present

## 2021-04-14 DIAGNOSIS — R0609 Other forms of dyspnea: Secondary | ICD-10-CM | POA: Diagnosis not present

## 2021-04-14 DIAGNOSIS — E039 Hypothyroidism, unspecified: Secondary | ICD-10-CM | POA: Diagnosis not present

## 2021-04-14 LAB — CBC WITH DIFFERENTIAL/PLATELET
Basophils Absolute: 0.1 10*3/uL (ref 0.0–0.2)
Basos: 1 %
EOS (ABSOLUTE): 0.2 10*3/uL (ref 0.0–0.4)
Eos: 3 %
Hematocrit: 40.5 % (ref 34.0–46.6)
Hemoglobin: 13.4 g/dL (ref 11.1–15.9)
Immature Grans (Abs): 0.1 10*3/uL (ref 0.0–0.1)
Immature Granulocytes: 1 %
Lymphocytes Absolute: 2.2 10*3/uL (ref 0.7–3.1)
Lymphs: 28 %
MCH: 31.2 pg (ref 26.6–33.0)
MCHC: 33.1 g/dL (ref 31.5–35.7)
MCV: 94 fL (ref 79–97)
Monocytes Absolute: 0.6 10*3/uL (ref 0.1–0.9)
Monocytes: 8 %
Neutrophils Absolute: 4.7 10*3/uL (ref 1.4–7.0)
Neutrophils: 59 %
Platelets: 219 10*3/uL (ref 150–450)
RBC: 4.3 x10E6/uL (ref 3.77–5.28)
RDW: 12.7 % (ref 11.7–15.4)
WBC: 7.8 10*3/uL (ref 3.4–10.8)

## 2021-04-14 LAB — LIPID PANEL
Chol/HDL Ratio: 5.1 ratio — ABNORMAL HIGH (ref 0.0–4.4)
Cholesterol, Total: 211 mg/dL — ABNORMAL HIGH (ref 100–199)
HDL: 41 mg/dL (ref >39)
LDL Chol Calc (NIH): 149 mg/dL — ABNORMAL HIGH (ref 0–99)
Triglycerides: 114 mg/dL (ref 0–149)
VLDL Cholesterol Cal: 21 mg/dL (ref 5–40)

## 2021-04-14 LAB — THYROID PANEL WITH TSH
Free Thyroxine Index: 1.8 (ref 1.2–4.9)
T3 Uptake Ratio: 28 % (ref 24–39)
T4, Total: 6.6 ug/dL (ref 4.5–12.0)
TSH: 3.94 u[IU]/mL (ref 0.450–4.500)

## 2021-04-14 LAB — COMPREHENSIVE METABOLIC PANEL
ALT: 16 IU/L (ref 0–32)
AST: 16 IU/L (ref 0–40)
Albumin/Globulin Ratio: 2 (ref 1.2–2.2)
Albumin: 4.5 g/dL (ref 3.7–4.7)
Alkaline Phosphatase: 60 IU/L (ref 44–121)
BUN/Creatinine Ratio: 20 (ref 12–28)
BUN: 22 mg/dL (ref 8–27)
Bilirubin Total: 0.6 mg/dL (ref 0.0–1.2)
CO2: 21 mmol/L (ref 20–29)
Calcium: 9.3 mg/dL (ref 8.7–10.3)
Chloride: 104 mmol/L (ref 96–106)
Creatinine, Ser: 1.11 mg/dL — ABNORMAL HIGH (ref 0.57–1.00)
Globulin, Total: 2.3 g/dL (ref 1.5–4.5)
Glucose: 85 mg/dL (ref 70–99)
Potassium: 4.6 mmol/L (ref 3.5–5.2)
Sodium: 141 mmol/L (ref 134–144)
Total Protein: 6.8 g/dL (ref 6.0–8.5)
eGFR: 52 mL/min/{1.73_m2} — ABNORMAL LOW (ref >59)

## 2021-04-16 DIAGNOSIS — M25512 Pain in left shoulder: Secondary | ICD-10-CM | POA: Diagnosis not present

## 2021-04-16 DIAGNOSIS — G8929 Other chronic pain: Secondary | ICD-10-CM | POA: Diagnosis not present

## 2021-04-16 DIAGNOSIS — M19012 Primary osteoarthritis, left shoulder: Secondary | ICD-10-CM | POA: Diagnosis not present

## 2021-04-16 DIAGNOSIS — M7542 Impingement syndrome of left shoulder: Secondary | ICD-10-CM | POA: Diagnosis not present

## 2021-04-16 DIAGNOSIS — M12812 Other specific arthropathies, not elsewhere classified, left shoulder: Secondary | ICD-10-CM | POA: Diagnosis not present

## 2021-04-20 DIAGNOSIS — M2550 Pain in unspecified joint: Secondary | ICD-10-CM | POA: Diagnosis not present

## 2021-04-20 DIAGNOSIS — R5383 Other fatigue: Secondary | ICD-10-CM | POA: Diagnosis not present

## 2021-04-20 DIAGNOSIS — R454 Irritability and anger: Secondary | ICD-10-CM | POA: Diagnosis not present

## 2021-04-20 DIAGNOSIS — N951 Menopausal and female climacteric states: Secondary | ICD-10-CM | POA: Diagnosis not present

## 2021-04-20 DIAGNOSIS — G4700 Insomnia, unspecified: Secondary | ICD-10-CM | POA: Diagnosis not present

## 2021-04-20 DIAGNOSIS — Z6828 Body mass index (BMI) 28.0-28.9, adult: Secondary | ICD-10-CM | POA: Diagnosis not present

## 2021-04-20 DIAGNOSIS — F329 Major depressive disorder, single episode, unspecified: Secondary | ICD-10-CM | POA: Diagnosis not present

## 2021-04-21 DIAGNOSIS — N390 Urinary tract infection, site not specified: Secondary | ICD-10-CM | POA: Diagnosis not present

## 2021-04-21 DIAGNOSIS — R197 Diarrhea, unspecified: Secondary | ICD-10-CM | POA: Diagnosis not present

## 2021-04-22 DIAGNOSIS — R197 Diarrhea, unspecified: Secondary | ICD-10-CM | POA: Diagnosis not present

## 2021-04-22 DIAGNOSIS — N390 Urinary tract infection, site not specified: Secondary | ICD-10-CM | POA: Diagnosis not present

## 2021-04-24 ENCOUNTER — Ambulatory Visit: Payer: Medicare Other | Admitting: Internal Medicine

## 2021-04-27 ENCOUNTER — Other Ambulatory Visit: Payer: Self-pay

## 2021-04-27 ENCOUNTER — Ambulatory Visit (INDEPENDENT_AMBULATORY_CARE_PROVIDER_SITE_OTHER): Payer: Medicare Other | Admitting: Internal Medicine

## 2021-04-27 ENCOUNTER — Encounter: Payer: Self-pay | Admitting: Internal Medicine

## 2021-04-27 VITALS — BP 126/64 | HR 67 | Temp 97.7°F | Ht 64.17 in | Wt 164.2 lb

## 2021-04-27 DIAGNOSIS — R197 Diarrhea, unspecified: Secondary | ICD-10-CM

## 2021-04-27 DIAGNOSIS — E785 Hyperlipidemia, unspecified: Secondary | ICD-10-CM

## 2021-04-27 DIAGNOSIS — E038 Other specified hypothyroidism: Secondary | ICD-10-CM

## 2021-04-27 DIAGNOSIS — R110 Nausea: Secondary | ICD-10-CM

## 2021-04-27 NOTE — Progress Notes (Signed)
BP 126/64    Pulse 67    Temp 97.7 F (36.5 C) (Oral)    Ht 5' 4.17" (1.63 m)    Wt 164 lb 3.2 oz (74.5 kg)    LMP  (LMP Unknown)    SpO2 96%    BMI 28.03 kg/m    Subjective:    Patient ID: Julia Deleon, female    DOB: 1945-07-24, 75 y.o.   MRN: [id number]  Chief Complaint  Patient presents with   Hypertension   Hyperlipidemia   COPD   Chronic Kidney Disease   Hypothyroidism   Labwork review    HPI: Julia Deleon is a 75 y.o. female  R pt had surgery in march for a hysterectomy / bladder tag per pt - her husband came down with a virus then and feels she felt bad since then.  Has had nuasea since then Sees GI and ID for such - ID is rechecking stools and urine for pt. Sees dr. Ola Spurr @ [facility] Is still having Diarrhea / Nausea since then.    Hyperlipidemia This is a chronic problem. The problem is controlled.  Diarrhea  This is a recurrent (taking probiotics. tried otc for such.) problem. The problem has been unchanged. Pertinent negatives include no arthralgias, bloating, chills, fever, headaches, increased  flatus, sweats, URI or vomiting.  Urinary Tract Infection  This is a recurrent (pt denies symptoms, ID is checking for recurrent UTi) problem. Pertinent negatives include no chills, sweats or vomiting.   Chief Complaint  Patient presents with   Hypertension   Hyperlipidemia   COPD   Chronic Kidney Disease   Hypothyroidism   Labwork review    Relevant past medical, surgical, family and social history reviewed and updated as indicated. Interim medical history since our last visit reviewed. Allergies and medications reviewed and updated.  Review of Systems  Constitutional:  Negative for chills and fever.  Gastrointestinal:  Positive for diarrhea. Negative for bloating, flatus and vomiting.  Musculoskeletal:  Negative for arthralgias.  Neurological:  Negative for headaches.   Per HPI unless specifically indicated above     Objective:     BP 126/64    Pulse 67    Temp 97.7 F (36.5 C) (Oral)    Ht 5' 4.17" (1.63 m)    Wt 164 lb 3.2 oz (74.5 kg)    LMP  (LMP Unknown)    SpO2 96%    BMI 28.03 kg/m   Wt Readings from Last 3 Encounters:  04/27/21 164 lb 3.2 oz (74.5 kg)  02/26/21 162 lb (73.5 kg)  01/23/21 158 lb (71.7 kg)    Physical Exam Vitals and nursing note reviewed.  Constitutional:      General: She is not in acute distress.    Appearance: Normal appearance. She is not ill-appearing or diaphoretic.  Eyes:     Conjunctiva/sclera: Conjunctivae normal.  Cardiovascular:     Rate and Rhythm: Tachycardia present.     Heart sounds: Murmur heard.  Pulmonary:     Effort: No respiratory distress.     Breath sounds: No stridor. No wheezing, rhonchi or rales.  Abdominal:     General: Abdomen is flat. Bowel sounds are normal. There is no distension.     Palpations: Abdomen is soft. There is no mass.     Tenderness: There is no abdominal tenderness. There is no guarding.  Skin:    General: Skin is warm and dry.     Coloration: Skin is not  jaundiced.     Findings: No erythema.  Neurological:     Mental Status: She is alert.    Results for orders placed or performed in visit on 04/13/21  Lipid panel  Result Value Ref Range   Cholesterol, Total 211 (H) 100 - 199 mg/dL   Triglycerides 114 0 - 149 mg/dL   HDL 41 >39 mg/dL   VLDL Cholesterol Cal 21 5 - 40 mg/dL   LDL Chol Calc (NIH) 149 (H) 0 - 99 mg/dL   Chol/HDL Ratio 5.1 (H) 0.0 - 4.4 ratio  Comprehensive metabolic panel  Result Value Ref Range   Glucose 85 70 - 99 mg/dL   BUN 22 8 - 27 mg/dL   Creatinine, Ser 1.11 (H) 0.57 - 1.00 mg/dL   eGFR 52 (L) >59 mL/min/1.73   BUN/Creatinine Ratio 20 12 - 28   Sodium 141 134 - 144 mmol/L   Potassium 4.6 3.5 - 5.2 mmol/L   Chloride 104 96 - 106 mmol/L   CO2 21 20 - 29 mmol/L   Calcium 9.3 8.7 - 10.3 mg/dL   Total Protein 6.8 6.0 - 8.5 g/dL   Albumin 4.5 3.7 - 4.7 g/dL   Globulin, Total 2.3 1.5 - 4.5 g/dL    Albumin/Globulin Ratio 2.0 1.2 - 2.2   Bilirubin Total 0.6 0.0 - 1.2 mg/dL   Alkaline Phosphatase 60 44 - 121 IU/L   AST 16 0 - 40 IU/L   ALT 16 0 - 32 IU/L  CBC w/Diff  Result Value Ref Range   WBC 7.8 3.4 - 10.8 x10E3/uL   RBC 4.30 3.77 - 5.28 x10E6/uL   Hemoglobin 13.4 11.1 - 15.9 g/dL   Hematocrit 40.5 34.0 - 46.6 %   MCV 94 79 - 97 fL   MCH 31.2 26.6 - 33.0 pg   MCHC 33.1 31.5 - 35.7 g/dL   RDW 12.7 11.7 - 15.4 %   Platelets 219 150 - 450 x10E3/uL   Neutrophils 59 Not Estab. %   Lymphs 28 Not Estab. %   Monocytes 8 Not Estab. %   Eos 3 Not Estab. %   Basos 1 Not Estab. %   Neutrophils Absolute 4.7 1.4 - 7.0 x10E3/uL   Lymphocytes Absolute 2.2 0.7 - 3.1 x10E3/uL   Monocytes Absolute 0.6 0.1 - 0.9 x10E3/uL   EOS (ABSOLUTE) 0.2 0.0 - 0.4 x10E3/uL   Basophils Absolute 0.1 0.0 - 0.2 x10E3/uL   Immature Granulocytes 1 Not Estab. %   Immature Grans (Abs) 0.1 0.0 - 0.1 x10E3/uL  Thyroid Panel With TSH  Result Value Ref Range   TSH 3.940 0.450 - 4.500 uIU/mL   T4, Total 6.6 4.5 - 12.0 ug/dL   T3 Uptake Ratio 28 24 - 39 %   Free Thyroxine Index 1.8 1.2 - 4.9  Bayer DCA Hb A1c Waived  Result Value Ref Range   HB A1C (BAYER DCA - WAIVED) 4.9 4.8 - 5.6 %        Current Outpatient Medications:    acetaminophen (TYLENOL) 650 MG CR tablet, Take 650-1,300 mg by mouth every 8 (eight) hours as needed for pain. , Disp: , Rfl:    AMBULATORY NON FORMULARY MEDICATION, Progesterone 4% cream Apply 1/2 ml 6 nights on, 1 night off NOTE DOSE CHANGE TO TRY TO WEAN OFF, Disp: 30 mL, Rfl: 5   AMBULATORY NON FORMULARY MEDICATION, Estriol vaginal cream 1 mg/gram Insert 0.5 mg 2-3 times weekly, Disp: 30 g, Rfl: 11   azelastine (ASTELIN) 0.1 % nasal  spray, Place 1 spray into both nostrils daily. Use in each nostril as directed, Disp: , Rfl:    chlorzoxazone (PARAFON) 500 MG tablet, TAKE ONE TABLET FOUR TIMES DAILY AS NEEDED FOR MUSCLE SPASM, Disp: 120 tablet, Rfl: 1   DHEA 10 MG TABS, Take by  mouth., Disp: , Rfl:    EPINEPHrine 0.3 mg/0.3 mL IJ SOAJ injection, Inject 0.3 mg into the muscle as needed for anaphylaxis., Disp: , Rfl:    estradiol (ESTRACE) 0.1 MG/GM vaginal cream, INSERT 0.5GM VAGINALLY 2-3 TIMES WEEKLY AS NEEDED, Disp: 42.5 g, Rfl: 1   fexofenadine (ALLEGRA) 60 MG tablet, Take 60 mg by mouth daily., Disp: , Rfl:    levothyroxine (SYNTHROID) 75 MCG tablet, TAKE 1 TABLET BY MOUTH EVERY MORNING BEFORE BREAKFAST, Disp: 90 tablet, Rfl: 2   losartan (COZAAR) 100 MG tablet, TAKE 1 TABLET BY MOUTH ONCE DAILY, Disp: 90 tablet, Rfl: 0   Lutein 20 MG CAPS, Take 1 capsule by mouth daily., Disp: , Rfl:    triamcinolone (NASACORT ALLERGY 24HR) 55 MCG/ACT AERO nasal inhaler, Place 2 sprays daily into the nose. (Patient taking differently: Place 2 sprays into the nose daily as needed (allergies).), Disp: 1 Inhaler, Rfl: 12   Turmeric POWD, Take by mouth., Disp: , Rfl:    colestipol (COLESTID) 1 g tablet, Take 2 tablets (2 g total) by mouth daily., Disp: 60 tablet, Rfl: 0 No current facility-administered medications for this visit.  Facility-Administered Medications Ordered in Other Visits:    sodium phosphate (FLEET) 7-19 GM/118ML enema 1 enema, 1 enema, Rectal, Once, Ardis Hughs, MD    Assessment & Plan:   Diarrhea - has had a PCR for stools continue fu with ID. Use probiotics.   HTN :  Continue current meds.  Medication compliance emphasised. pt advised to keep Bp logs. Pt verbalised understanding of the same. Pt to have a low salt diet . Exercise to reach a goal of at least 150 mins a week.  lifestyle modifications explained and pt understands importance of the above.  3.HYPOTHYROIDISM:  Chronic, ongoing with recent TSH within normal range.  Continue current medication regimen and adjust as needed.  Plan to recheck TSH at physical in 6 months.    Problem List Items Addressed This Visit       Endocrine   Hypothyroidism     Other   Hyperlipidemia    Diarrhea - Primary   Nausea     No orders of the defined types were placed in this encounter.    No orders of the defined types were placed in this encounter.    Follow up plan: Return in about 6 months (around 10/26/2021).

## 2021-04-28 ENCOUNTER — Encounter: Payer: Self-pay | Admitting: Gastroenterology

## 2021-04-28 ENCOUNTER — Ambulatory Visit (INDEPENDENT_AMBULATORY_CARE_PROVIDER_SITE_OTHER): Payer: Medicare Other | Admitting: Gastroenterology

## 2021-04-28 VITALS — BP 173/71 | Temp 97.9°F | Wt 165.0 lb

## 2021-04-28 DIAGNOSIS — A09 Infectious gastroenteritis and colitis, unspecified: Secondary | ICD-10-CM | POA: Diagnosis not present

## 2021-04-28 NOTE — Progress Notes (Signed)
Vonda Antigua, MD [address]  [place]  [place], [place] [address]  Main: [phone]  Fax: [phone]   Primary Care Physician: Charlynne Cousins, MD  Chief complaint: Diarrhea   HPI: Julia Deleon is a 75 y.o. female here for follow-up and was found to be norovirus positive on stool test done on 04/22/2021.  Was previously seen for nausea,  and diarrhea that had resolved as of the last visit, but reoccurred more recently and stool testing ordered by infectious disease and as an outpatient was positive for norovirus.  Patient denies any blood in stool.  Is not having any nausea.  Imodium was recommended by infectious disease but patient has not been taking it.  She took it for 1 day and that whole day she did not have any further bowel movements so she does feel that it helps but has not been taking it besides that.  When patient was previously seen for nausea and diarrhea, she had undergone multiple courses of antibiotics for recurrent UTIs and her symptoms were thought to be related to those.  She was also finding that when she was on an empty stomach her nausea was worse and so she started eating a snack every 2-3 hours and that relieved her nausea as well.  Other work-up including CT abdomen pelvis, H. pylori stool antigen, GI profile, fecal elastase, EGD and colonoscopy have all been reassuring   ROS: All ROS reviewed and negative except as per HPI   Past Medical History:  Diagnosis Date   Arthritis    Chronic kidney disease    COPD (chronic obstructive pulmonary disease) ([place])    pt denies    Dyspnea    Family history of adverse reaction to anesthesia    father was slow to wake up   Fuch's endothelial dystrophy    GERD (gastroesophageal reflux disease)    Hyperlipidemia    Hypertension    Hypothyroidism    Pneumonia    hx of    PONV (postoperative nausea and vomiting)    slow to wake up and PONV    Past Surgical History:  Procedure Laterality Date    ABDOMINAL HYSTERECTOMY     APPENDECTOMY     BACK SURGERY  09/26/2018   L4-5 PLIF by Dr. Arnoldo Morale   CARDIAC CATHETERIZATION     CHOLECYSTECTOMY     COLONOSCOPY WITH PROPOFOL N/A 10/23/2020   Procedure: COLONOSCOPY WITH PROPOFOL;  Surgeon: Virgel Manifold, MD;  Location: [facility] ENDOSCOPY;  Service: Endoscopy;  Laterality: N/A;   ESOPHAGOGASTRODUODENOSCOPY N/A 10/23/2020   Procedure: ESOPHAGOGASTRODUODENOSCOPY (EGD);  Surgeon: Virgel Manifold, MD;  Location: [facility] ENDOSCOPY;  Service: Endoscopy;  Laterality: N/A;   EYE SURGERY     FOOT SURGERY Right    pin removed left   laser vein surgery     NASAL SINUS SURGERY     RIGHT/LEFT HEART CATH AND CORONARY ANGIOGRAPHY N/A 12/06/2017   Procedure: RIGHT/LEFT HEART CATH AND CORONARY ANGIOGRAPHY;  Surgeon: Yolonda Kida, MD;  Location: [facility];  Service: Cardiovascular;  Laterality: N/A;   ROBOTIC ASSISTED LAPAROSCOPIC SACROCOLPOPEXY Bilateral 07/30/2020   Procedure: XI ROBOTIC ASSISTED LAPAROSCOPIC SACROCOLPOPEXY AND SUPRACERVICAL HYSTERECTOMY WITH BILATERAL SALPINGO OOPHERECTOMY;  Surgeon: Ardis Hughs, MD;  Location: [facility];  Service: Urology;  Laterality: Bilateral;  REQUESTING 4 HRS   TONSILLECTOMY      Prior to Admission medications   Medication Sig Start Date End Date Taking? Authorizing Provider  acetaminophen (TYLENOL) 650 MG  CR tablet Take 650-1,300 mg by mouth every 8 (eight) hours as needed for pain.    Yes [provider]  AMBULATORY NON FORMULARY MEDICATION Progesterone 4% cream Apply 1/2 ml 6 nights on, 1 night off NOTE DOSE CHANGE TO TRY TO WEAN OFF 09/03/04  Yes Copland, Alicia B, PA-C  AMBULATORY NON FORMULARY MEDICATION Estriol vaginal cream 1 mg/gram Insert 0.5 mg 2-3 times weekly 2/37/62  Yes Copland, Alicia B, PA-C  azelastine (ASTELIN) 0.1 % nasal spray Place 1 spray into both nostrils daily. Use in each nostril as directed   Yes [provider]  chlorzoxazone (PARAFON) 500 MG  tablet TAKE ONE TABLET FOUR TIMES DAILY AS NEEDED FOR MUSCLE SPASM 10/09/20  Yes [name], Avanti, MD  DHEA 10 MG TABS Take by mouth.   Yes [provider]  EPINEPHrine 0.3 mg/0.3 mL IJ SOAJ injection Inject 0.3 mg into the muscle as needed for anaphylaxis. 01/19/19  Yes [provider]  estradiol (ESTRACE) 0.1 MG/GM vaginal cream INSERT 0.5GM VAGINALLY 2-3 TIMES WEEKLY AS NEEDED 01/07/50  Yes Copland, Alicia B, PA-C  fexofenadine (ALLEGRA) 60 MG tablet Take 60 mg by mouth daily.   Yes [provider]  levothyroxine (SYNTHROID) 75 MCG tablet TAKE 1 TABLET BY MOUTH EVERY MORNING BEFORE BREAKFAST 03/21/21  Yes [name], Avanti, MD  losartan (COZAAR) 100 MG tablet TAKE 1 TABLET BY MOUTH ONCE DAILY 03/21/21  Yes [name], Avanti, MD  Lutein 20 MG CAPS Take 1 capsule by mouth daily.   Yes [provider]  triamcinolone (NASACORT ALLERGY 24HR) 55 MCG/ACT AERO nasal inhaler Place 2 sprays daily into the nose. Patient taking differently: Place 2 sprays into the nose daily as needed (allergies). 03/28/17  Yes Guadalupe Maple, MD  Turmeric POWD Take by mouth.   Yes [provider]  colestipol (COLESTID) 1 g tablet Take 2 tablets (2 g total) by mouth daily. 10/30/20 11/29/20  Virgel Manifold, MD    Family History  Problem Relation Age of Onset   Cancer Mother        lung   CAD Mother    Stroke Father    Breast cancer Sister        62s   Fibromyalgia Sister    Brain cancer Sister    Heart disease Maternal Grandmother    Cancer Paternal Grandmother        throat   Emphysema Paternal Grandfather    Varicose Veins Son    Breast cancer Other      Social History   Tobacco Use   Smoking status: Never   Smokeless tobacco: Never  Vaping Use   Vaping Use: Never used  Substance Use Topics   Alcohol use: No    Alcohol/week: 0.0 standard drinks   Drug use: No    Allergies as of 04/28/2021 - Review Complete 04/28/2021  Allergen Reaction Noted   Colesevelam  Nausea And Vomiting 12/09/2016   Levofloxacin Hives 06/12/2015   Amoxicillin  07/14/2020   Benazepril Other (See Comments) 06/04/2013   Clavulanic acid Other (See Comments) 10/22/2020   Methylisothiazolinone Other (See Comments) 12/12/2017   Thimerosal Other (See Comments) 12/12/2017   Amoxicillin-pot clavulanate Nausea Only 11/27/2014   Latex Rash 11/29/2017   Simvastatin Itching 11/26/2014    Physical Examination:  Constitutional: General:   Alert,  Well-developed, well-nourished, pleasant and cooperative in NAD BP (!) 173/71    Temp 97.9 F (36.6 C) (Oral)    Wt 165 lb (74.8 kg)    LMP  (  LMP Unknown)    BMI 28.17 kg/m   Respiratory: Normal respiratory effort  Gastrointestinal:  Soft, non-tender and non-distended without masses, hepatosplenomegaly or hernias noted.  No guarding or rebound tenderness.     Cardiac: No clubbing or edema.  No cyanosis. Normal posterior tibial pedal pulses noted.  Psych:  Alert and cooperative. Normal mood and affect.  Musculoskeletal:  Normal gait. Head normocephalic, atraumatic. Symmetrical without gross deformities. 5/5 Lower extremity strength bilaterally.  Skin: Warm. Intact without significant lesions or rashes. No jaundice.  Neck: Supple, trachea midline  Lymph: No cervical lymphadenopathy  Psych:  Alert and oriented x3, Alert and cooperative. Normal mood and affect.  Labs: CMP     Component Value Date/Time   NA 141 04/13/2021 0856   K 4.6 04/13/2021 0856   CL 104 04/13/2021 0856   CO2 21 04/13/2021 0856   GLUCOSE 85 04/13/2021 0856   GLUCOSE 142 (H) 07/31/2020 0341   BUN 22 04/13/2021 0856   CREATININE 1.11 (H) 04/13/2021 0856   CALCIUM 9.3 04/13/2021 0856   PROT 6.8 04/13/2021 0856   ALBUMIN 4.5 04/13/2021 0856   AST 16 04/13/2021 0856   AST 25 10/17/2017 0937   ALT 16 04/13/2021 0856   ALT 26 10/17/2017 0937   ALKPHOS 60 04/13/2021 0856   BILITOT 0.6 04/13/2021 0856   GFRNONAA 54 (L) 07/31/2020 0341   GFRAA 56 (L)  04/11/2020 0924   Lab Results  Component Value Date   WBC 7.8 04/13/2021   HGB 13.4 04/13/2021   HCT 40.5 04/13/2021   MCV 94 04/13/2021   PLT 219 04/13/2021    Imaging Studies:   Assessment and Plan:   Julia Deleon is a 75 y.o. y/o female here for follow-up and was found to have be norovirus positive last week  Patient's diarrhea had initially improved but recently reoccurred due to being positive for norovirus  It is currently improving again.  She has had reassuring work-up previously  She was recommended to hydrate well with water  Good oral, food and hand hygiene discussed  Would recommend taking Imodium once or twice a day for the next week and hold for any hard stools or if she is not having any further bowel movements that day to skip the Imodium the next day  After about a week, if diarrhea has improved, patient can take Imodium as needed or 2-3 times a week.  Patient advised to stop Imodium for any constipation symptoms  If symptoms do not improve, patient advised to call us and she verbalized understanding  Follow-up in 8 to 12 weeks or earlier if needed    Dr Vonda Antigua

## 2021-05-02 DIAGNOSIS — Z23 Encounter for immunization: Secondary | ICD-10-CM | POA: Diagnosis not present

## 2021-06-19 ENCOUNTER — Other Ambulatory Visit: Payer: Self-pay | Admitting: Internal Medicine

## 2021-06-19 DIAGNOSIS — I10 Essential (primary) hypertension: Secondary | ICD-10-CM

## 2021-06-19 NOTE — Telephone Encounter (Signed)
Requested Prescriptions  Pending Prescriptions Disp Refills   losartan (COZAAR) 100 MG tablet [Pharmacy Med Name: LOSARTAN POTASSIUM 100 MG TAB] 90 tablet 0    Sig: TAKE 1 TABLET BY MOUTH ONCE DAILY     Cardiovascular:  Angiotensin Receptor Blockers Failed - 06/19/2021 11:49 AM      Failed - Cr in normal range and within 180 days    Creatinine, Ser  Date Value Ref Range Status  04/13/2021 1.11 (H) 0.57 - 1.00 mg/dL Final         Failed - Last BP in normal range    BP Readings from Last 1 Encounters:  04/28/21 (!) 173/71         Passed - K in normal range and within 180 days    Potassium  Date Value Ref Range Status  04/13/2021 4.6 3.5 - 5.2 mmol/L Final         Passed - Patient is not pregnant      Passed - Valid encounter within last 6 months    Recent Outpatient Visits          1 month ago Diarrhea, unspecified type   [facility] [name], Avanti, MD   4 months ago Diarrhea, unspecified type   [facility] [name], Avanti, MD   8 months ago B12 deficiency   [place] [name], Avanti, MD   9 months ago Epigastric pain   [place], Lauren A, NP   1 year ago Other specified hypothyroidism   [facility] [place], [place], DO      Future Appointments            In 2 weeks Vanga, Tally Due, MD [place]   In 4 months [name], Avanti, MD [facility], [place]   In 7 months Ralene Bathe, MD [place]

## 2021-06-24 ENCOUNTER — Ambulatory Visit: Payer: Medicare Other

## 2021-07-06 ENCOUNTER — Encounter: Payer: Self-pay | Admitting: Gastroenterology

## 2021-07-06 ENCOUNTER — Other Ambulatory Visit: Payer: Self-pay

## 2021-07-06 ENCOUNTER — Ambulatory Visit (INDEPENDENT_AMBULATORY_CARE_PROVIDER_SITE_OTHER): Payer: Medicare Other | Admitting: Gastroenterology

## 2021-07-06 VITALS — BP 195/67 | HR 67 | Temp 97.7°F | Ht 64.17 in | Wt 164.2 lb

## 2021-07-06 DIAGNOSIS — Z86010 Personal history of colonic polyps: Secondary | ICD-10-CM | POA: Diagnosis not present

## 2021-07-06 DIAGNOSIS — K449 Diaphragmatic hernia without obstruction or gangrene: Secondary | ICD-10-CM | POA: Diagnosis not present

## 2021-07-06 NOTE — Progress Notes (Signed)
Julia Darby, MD [address]  [place]  [place], [place] [address]  Main: [phone]  Fax: [phone]    Gastroenterology Consultation  Referring Provider:     Charlynne Cousins, MD Primary Care Physician:  Charlynne Cousins, MD Primary Gastroenterologist:  Dr. Bonna Gains Reason for Consultation:     Follow-up of diarrhea        HPI:   Darothy C Deleon is a 76 y.o. female referred by Dr. Charlynne Cousins, MD  for consultation & management of history of diarrhea.  Patient is no longer experiencing diarrhea which was secondary to norovirus.  She underwent EGD revealed small hiatal hernia.  She also had a colonoscopy which revealed benign adenomas.  Patient is currently asymptomatic and has been doing well.  Patient has intermittent heartburn.  NSAIDs: None  Antiplts/Anticoagulants/Anti thrombotics: None  GI Procedures:  Upper endoscopy and colonoscopy 10/23/2020 Small hiatal hernia DIAGNOSIS:  A.  DUODENUM; COLD BIOPSY:  - DUODENAL MUCOSA WITH INTACT VILLI.  - NEGATIVE FOR ACTIVE INFLAMMATION, INTRAEPITHELIAL LYMPHOCYTOSIS, AND  INFECTIOUS AGENTS.   B.  STOMACH; COLD BIOPSY:  - ANTRAL AND OXYNTIC MUCOSA WITHOUT PATHOLOGIC CHANGES.  - NEGATIVE FOR H. PYLORI, INTESTINAL METAPLASIA, DYSPLASIA, AND  MALIGNANCY.   C.  COLON POLYP, CECUM; COLD BIOPSY:  - COLONIC MUCOSA WITH INTACT CRYPT ARCHITECTURE AND EDEMA.  - NEGATIVE FOR DYSPLASIA AND MALIGNANCY (ADDITIONAL DEEPER SECTIONS WERE  REVIEWED).   D.  COLON; RANDOM COLD BIOPSY:  - COLONIC MUCOSA WITH INTACT CRYPT ARCHITECTURE.  - NEGATIVE FOR MICROSCOPIC COLITIS, DYSPLASIA, AND MALIGNANCY.   E.  COLON POLYP, SIGMOID; COLD SNARE:  - SESSILE SERRATED POLYP, MULTIPLE FRAGMENTS.  - NEGATIVE FOR DYSPLASIA AND MALIGNANCY.   F.  COLON POLYP, SIGMOID; COLD SNARE:  - TUBULAR ADENOMA.  - NEGATIVE FOR HIGH-GRADE DYSPLASIA AND MALIGNANCY.   Past Medical History:  Diagnosis Date   Arthritis    Chronic kidney disease    COPD (chronic  obstructive pulmonary disease) ([place])    pt denies    Dyspnea    Family history of adverse reaction to anesthesia    father was slow to wake up   Fuch's endothelial dystrophy    GERD (gastroesophageal reflux disease)    Hyperlipidemia    Hypertension    Hypothyroidism    Pneumonia    hx of    PONV (postoperative nausea and vomiting)    slow to wake up and PONV    Past Surgical History:  Procedure Laterality Date   ABDOMINAL HYSTERECTOMY     APPENDECTOMY     BACK SURGERY  09/26/2018   L4-5 PLIF by Dr. Arnoldo Morale   CARDIAC CATHETERIZATION     CHOLECYSTECTOMY     COLONOSCOPY WITH PROPOFOL N/A 10/23/2020   Procedure: COLONOSCOPY WITH PROPOFOL;  Surgeon: Virgel Manifold, MD;  Location: [facility] ENDOSCOPY;  Service: Endoscopy;  Laterality: N/A;   ESOPHAGOGASTRODUODENOSCOPY N/A 10/23/2020   Procedure: ESOPHAGOGASTRODUODENOSCOPY (EGD);  Surgeon: Virgel Manifold, MD;  Location: [facility] ENDOSCOPY;  Service: Endoscopy;  Laterality: N/A;   EYE SURGERY     FOOT SURGERY Right    pin removed left   laser vein surgery     NASAL SINUS SURGERY     RIGHT/LEFT HEART CATH AND CORONARY ANGIOGRAPHY N/A 12/06/2017   Procedure: RIGHT/LEFT HEART CATH AND CORONARY ANGIOGRAPHY;  Surgeon: Yolonda Kida, MD;  Location: [facility];  Service: Cardiovascular;  Laterality: N/A;   ROBOTIC ASSISTED LAPAROSCOPIC SACROCOLPOPEXY Bilateral 07/30/2020   Procedure: XI ROBOTIC ASSISTED LAPAROSCOPIC  SACROCOLPOPEXY AND SUPRACERVICAL HYSTERECTOMY WITH BILATERAL SALPINGO OOPHERECTOMY;  Surgeon: Ardis Hughs, MD;  Location: [facility];  Service: Urology;  Laterality: Bilateral;  REQUESTING 4 HRS   TONSILLECTOMY     Current Outpatient Medications:    acetaminophen (TYLENOL) 650 MG CR tablet, Take 650-1,300 mg by mouth every 8 (eight) hours as needed for pain. , Disp: , Rfl:    AMBULATORY NON FORMULARY MEDICATION, Progesterone 4% cream Apply 1/2 ml 6 nights on, 1 night off NOTE DOSE CHANGE TO TRY TO WEAN OFF,  Disp: 30 mL, Rfl: 5   AMBULATORY NON FORMULARY MEDICATION, Estriol vaginal cream 1 mg/gram Insert 0.5 mg 2-3 times weekly, Disp: 30 g, Rfl: 11   azelastine (ASTELIN) 0.1 % nasal spray, Place 1 spray into both nostrils daily. Use in each nostril as directed, Disp: , Rfl:    chlorzoxazone (PARAFON) 500 MG tablet, TAKE ONE TABLET FOUR TIMES DAILY AS NEEDED FOR MUSCLE SPASM, Disp: 120 tablet, Rfl: 1   colestipol (COLESTID) 1 g tablet, Take 2 tablets (2 g total) by mouth daily., Disp: 60 tablet, Rfl: 0   DHEA 10 MG TABS, Take by mouth., Disp: , Rfl:    EPINEPHrine 0.3 mg/0.3 mL IJ SOAJ injection, Inject 0.3 mg into the muscle as needed for anaphylaxis., Disp: , Rfl:    estradiol (ESTRACE) 0.1 MG/GM vaginal cream, INSERT 0.5GM VAGINALLY 2-3 TIMES WEEKLY AS NEEDED, Disp: 42.5 g, Rfl: 1   fexofenadine (ALLEGRA) 60 MG tablet, Take 60 mg by mouth daily., Disp: , Rfl:    levothyroxine (SYNTHROID) 75 MCG tablet, TAKE 1 TABLET BY MOUTH EVERY MORNING BEFORE BREAKFAST, Disp: 90 tablet, Rfl: 2   losartan (COZAAR) 100 MG tablet, TAKE 1 TABLET BY MOUTH ONCE DAILY, Disp: 90 tablet, Rfl: 1   Lutein 20 MG CAPS, Take 1 capsule by mouth daily., Disp: , Rfl:    triamcinolone (NASACORT ALLERGY 24HR) 55 MCG/ACT AERO nasal inhaler, Place 2 sprays daily into the nose. (Patient taking differently: Place 2 sprays into the nose daily as needed (allergies).), Disp: 1 Inhaler, Rfl: 12   Turmeric POWD, Take by mouth., Disp: , Rfl:  No current facility-administered medications for this visit.  Facility-Administered Medications Ordered in Other Visits:    sodium phosphate (FLEET) 7-19 GM/118ML enema 1 enema, 1 enema, Rectal, Once, Louis Meckel Viona Gilmore, MD    Family History  Problem Relation Age of Onset   Cancer Mother        lung   CAD Mother    Stroke Father    Breast cancer Sister        72s   Fibromyalgia Sister    Brain cancer Sister    Heart disease Maternal Grandmother    Cancer Paternal Grandmother        throat    Emphysema Paternal Grandfather    Varicose Veins Son    Breast cancer Other      Social History   Tobacco Use   Smoking status: Never   Smokeless tobacco: Never  Vaping Use   Vaping Use: Never used  Substance Use Topics   Alcohol use: No    Alcohol/week: 0.0 standard drinks   Drug use: No    Allergies as of 07/06/2021 - Review Complete 07/06/2021  Allergen Reaction Noted   Colesevelam Nausea And Vomiting 12/09/2016   Levofloxacin Hives 06/12/2015   Amoxicillin  07/14/2020   Benazepril Other (See Comments) 06/04/2013   Clavulanic acid Other (See Comments) 10/22/2020   Methylisothiazolinone Other (See Comments) 12/12/2017  Thimerosal Other (See Comments) 12/12/2017   Amoxicillin-pot clavulanate Nausea Only 11/27/2014   Latex Rash 11/29/2017   Simvastatin Itching 11/26/2014    Review of Systems:    All systems reviewed and negative except where noted in HPI.   Physical Exam:  BP (!) 195/67 (BP Location: Left Arm, Patient Position: Sitting, Cuff Size: Normal)    Pulse 67    Temp 97.7 F (36.5 C) (Oral)    Ht 5' 4.17" (1.63 m)    Wt 164 lb 4 oz (74.5 kg)    LMP  (LMP Unknown)    BMI 28.04 kg/m  No LMP recorded (lmp unknown). Patient has had a hysterectomy.  General:   Alert,  Well-developed, well-nourished, pleasant and cooperative in NAD Head:  Normocephalic and atraumatic. Eyes:  Sclera clear, no icterus.   Conjunctiva pink. Ears:  Normal auditory acuity. Nose:  No deformity, discharge, or lesions. Mouth:  No deformity or lesions,oropharynx pink & moist. Neck:  Supple; no masses or thyromegaly. Lungs:  Respirations even and unlabored.  Clear throughout to auscultation.   No wheezes, crackles, or rhonchi. No acute distress. Heart:  Regular rate and rhythm; no murmurs, clicks, rubs, or gallops. Abdomen:  Normal bowel sounds. Soft, non-tender and non-distended without masses, hepatosplenomegaly or hernias noted.  No guarding or rebound tenderness.   Rectal: Not  performed Msk:  Symmetrical without gross deformities. Good, equal movement & strength bilaterally. Pulses:  Normal pulses noted. Extremities:  No clubbing or edema.  No cyanosis. Neurologic:  Alert and oriented x3;  grossly normal neurologically. Psych:  Alert and cooperative. Normal mood and affect.  Imaging Studies: Reviewed  Assessment and Plan:   Averill C Peasley is a 76 y.o. pleasant Caucasian female with history of CKD, hypertension, hyperlipidemia.  She has small hiatal hernia and adenomas of the colon  Hiatal hernia: Small Recommend to take Pepcid as needed for heartburn  Sessile serrated adenoma and tubular adenomas of the colon, more than 1 cm in size Recommend surveillance colonoscopy in 10/2021   Follow up as needed   Julia Darby, MD

## 2021-07-09 ENCOUNTER — Ambulatory Visit: Payer: Medicare Other | Attending: Sports Medicine

## 2021-07-09 ENCOUNTER — Other Ambulatory Visit: Payer: Self-pay

## 2021-07-09 DIAGNOSIS — M545 Low back pain, unspecified: Secondary | ICD-10-CM | POA: Diagnosis present

## 2021-07-09 DIAGNOSIS — G8929 Other chronic pain: Secondary | ICD-10-CM | POA: Diagnosis present

## 2021-07-09 DIAGNOSIS — M6281 Muscle weakness (generalized): Secondary | ICD-10-CM | POA: Diagnosis present

## 2021-07-09 DIAGNOSIS — M5416 Radiculopathy, lumbar region: Secondary | ICD-10-CM | POA: Insufficient documentation

## 2021-07-09 DIAGNOSIS — R262 Difficulty in walking, not elsewhere classified: Secondary | ICD-10-CM | POA: Insufficient documentation

## 2021-07-09 NOTE — Patient Instructions (Signed)
Access Code: [email or web address] ?URL: [email or web address] ?Date: 07/09/2021 ?Prepared by: Joneen Boers ? ?Exercises ?Seated Transversus Abdominis Bracing - 5 x daily - 7 x weekly - 3 sets - 10 reps - 5 seconds hold ? ?

## 2021-07-09 NOTE — Therapy (Signed)
[facility] [address], [place], [address] Phone: [phone]   Fax:  [phone]  Physical Therapy Evaluation  Patient Details  Name: Julia Deleon MRN: [id number] Date of Birth: 11/20/1945 Referring Provider (PT): Rosalia Hammers, DO   Encounter Date: 07/09/2021   PT End of Session - 07/09/21 1054     Visit Number 1    Number of Visits 17    Date for PT Re-Evaluation 09/03/21    Authorization Type 1    Authorization Time Period of 10 progress report    PT Start Time 1054    PT Stop Time 1147    PT Time Calculation (min) 53 min    Equipment Utilized During Treatment Gait belt    Activity Tolerance Patient tolerated treatment well    Behavior During Therapy WFL for tasks assessed/performed             Past Medical History:  Diagnosis Date   Arthritis    Chronic kidney disease    COPD (chronic obstructive pulmonary disease) ([place])    pt denies    Dyspnea    Family history of adverse reaction to anesthesia    father was slow to wake up   Fuch's endothelial dystrophy    GERD (gastroesophageal reflux disease)    Hyperlipidemia    Hypertension    Hypothyroidism    Pneumonia    hx of    PONV (postoperative nausea and vomiting)    slow to wake up and PONV    Past Surgical History:  Procedure Laterality Date   ABDOMINAL HYSTERECTOMY     APPENDECTOMY     BACK SURGERY  09/26/2018   L4-5 PLIF by Dr. Arnoldo Morale   CARDIAC CATHETERIZATION     CHOLECYSTECTOMY     COLONOSCOPY WITH PROPOFOL N/A 10/23/2020   Procedure: COLONOSCOPY WITH PROPOFOL;  Surgeon: Virgel Manifold, MD;  Location: [facility] ENDOSCOPY;  Service: Endoscopy;  Laterality: N/A;   ESOPHAGOGASTRODUODENOSCOPY N/A 10/23/2020   Procedure: ESOPHAGOGASTRODUODENOSCOPY (EGD);  Surgeon: Virgel Manifold, MD;  Location: [facility] ENDOSCOPY;  Service: Endoscopy;  Laterality: N/A;   EYE SURGERY     FOOT SURGERY Right    pin removed left   laser vein  surgery     NASAL SINUS SURGERY     RIGHT/LEFT HEART CATH AND CORONARY ANGIOGRAPHY N/A 12/06/2017   Procedure: RIGHT/LEFT HEART CATH AND CORONARY ANGIOGRAPHY;  Surgeon: Yolonda Kida, MD;  Location: [facility];  Service: Cardiovascular;  Laterality: N/A;   ROBOTIC ASSISTED LAPAROSCOPIC SACROCOLPOPEXY Bilateral 07/30/2020   Procedure: XI ROBOTIC ASSISTED LAPAROSCOPIC SACROCOLPOPEXY AND SUPRACERVICAL HYSTERECTOMY WITH BILATERAL SALPINGO OOPHERECTOMY;  Surgeon: Ardis Hughs, MD;  Location: [facility];  Service: Urology;  Laterality: Bilateral;  REQUESTING 4 HRS   TONSILLECTOMY      There were no vitals filed for this visit.    Subjective Assessment - 07/09/21 1107     Subjective Low back pain: 4/10 currently (pt sitting); 9/10 at most for the past 3 months.    Pertinent History Chronic low back pain. Had L4/5 fusion in her low back May 2020 which helped her sleep. Had PT following surgery which helped. Bowel and bladder are not perfect but improving. Had pelvic floor therapy which did not help. The bowel issues might have started after the back surgery. Feels like her urinary tract system quit due to the pain after back surgery. Was placed on a catheter for 6 months after the surgery.  Denies saddle anesthesia. Pt also states having a kidney infection May 2022 and was diagnosed at the end of [place] 2022. Took all summer to get rid of the kidney infection which wiped her out and weakned her back.    Patient Stated Goals Not use her back brace. Want to walk further.    Currently in Pain? Yes    Pain Score 4     Pain Location Back    Pain Orientation Left;Right;Posterior;Lower    Pain Type Chronic pain    Pain Radiating Towards B L5 dermatome to the knees.    Pain Onset More than a month ago    Pain Frequency Occasional    Aggravating Factors  Pain increases as the day progresses. Standing up straight, walking (longer distances)    Pain Relieving Factors Using a grocery cart; using  back brace                [facility] PT Assessment - 07/09/21 1053       Assessment   Medical Diagnosis Chronic L low back pain    Referring Provider (PT) Rosalia Hammers, DO    Onset Date/Surgical Date 06/15/21      Precautions   Precaution Comments No known precautions      Restrictions   Other Position/Activity Restrictions No known restrictions      Balance Screen   Has the patient fallen in the past 6 months No    Has the patient had a decrease in activity level because of a fear of falling?  No    Is the patient reluctant to leave their home because of a fear of falling?  No      Home Environment   Additional Comments Pt lives in a 2 story home with husband. Does not know how many steps to second floor. B rail to go half way up, then R rail the rest of the way. one step from garage door, no rails, 6 steps front door B rail but far apart.      Prior Function   Vocation Retired      Observation/Other Assessments   Focus on Therapeutic Outcomes (FOTO)  Lumbar Spine FOTO 44      Posture/Postural Control   Posture Comments Slight R trunk rotation, R iliac crest higher, slight L lateral shift around T12/L1 area, slight L posterior pelvic rotation. Backward lean (hanging on the "Y")      AROM   Lumbar Flexion full    Lumbar Extension limited with R L5 dermatome tightness    Lumbar - Right Side Bend limited    Lumbar - Left Side Bend limited    Lumbar - Right Rotation WFL with slight R L5 tightness    Lumbar - Left Rotation WFL with R L5 dightness      Strength   Right Hip Flexion 4/5    Right Hip Extension 4+/5   Seated manually resisted   Right Hip External Rotation  4-/5    Right Hip ABduction 4/5   Seated manually resisted clamshell   Left Hip Flexion 4-/5    Left Hip Extension 3+/5   Seated manually resisted   Left Hip External Rotation 3+/5    Left Hip ABduction 4-/5   Seated manually resisted clamshell   Right Knee Flexion 4/5    Right Knee Extension 4+/5     Left Knee Flexion 4-/5    Left Knee Extension 4+/5      Special Tests   Other special tests (-)  repeated flexion test      Ambulation/Gait   Gait Comments decreased stance R LE, L pelvic drop during R LE stance phase with R lateral lean                        Objective measurements completed on examination: See above findings.    Latex allergies   Blood pressure L arm sitting, mechanically taken, normal cuff: 136/44, HR 56  Wears compression hose B LE secondary to varicouse veins   Medbridge Access Code [email or web address]  Therapeutic exercise  Seated transversus abdominis contractoin. 10x5 seconds    Improved exercise technique, movement at target joints, use of target muscles after mod verbal, visual, tactile cues.     Response to treatment Pt tolerated session well without aggravation of symptoms.    Clinical impression Pt is a 76 year old female who came to physical therapy secondary to to chronic back pain. She also presents with altered gait pattern and posture, decreased trunk and bilateral hip strength, reproduction of symptoms with lumbar extension as well as R and L rotation, and difficulty performing tasks which involves standing and ambulation. Pt will benefit from skilled physical therapy services to address the aforementioned deficits.                    PT Education - 07/09/21 1259     Education Details ther-ex, HEP, POC    Person(s) Educated Patient    Methods Explanation;Demonstration;Tactile cues;Verbal cues;Handout    Comprehension Returned demonstration;Verbalized understanding              PT Short Term Goals - 07/09/21 1305       PT SHORT TERM GOAL #1   Title Pt will be independent with her initial HEP to decrease pain, improve strength and ability to ambulate longer distances.    Baseline Pt has started her initial HEP (07/09/2021)    Time 3    Period Weeks    Status New    Target Date 07/30/21                PT Long Term Goals - 07/09/21 1309       PT LONG TERM GOAL #1   Title Pt will have a decrease in low back pain to 4/10 or less at worst to promote ability to ambulate longer distances as well as perform standing tasks more comfortably.    Baseline 9/10 low back pain at most for the past 3 months. Pt also demonstrates B L5 radiating symptoms to knees (07/09/2021)    Time 8    Period Weeks    Status New    Target Date 09/03/21      PT LONG TERM GOAL #2   Title Pt will improve B hip extension, abduction and ER strength by at least 1/2 MMT grade to promote ability to perform standing tasks with less back pain.    Baseline Hip extension 4+/5 R, 3+/5 L, hip abduction 4/5 R,  4-/5 L, hip ER 4-/5 R, 3+/5 L (07/09/2021)    Time 8    Period Weeks    Status New    Target Date 09/03/21      PT LONG TERM GOAL #3   Title Pt will improve her lumbar FOTO score by at least 10 points as a demonstration of improved function.    Baseline Lumbar Spine FOTO 44 (07/09/2021)    Time 8    Period Weeks  Status New    Target Date 09/03/21                    Plan - 07/09/21 1301     Clinical Impression Statement Pt is a 76 year old female who came to physical therapy secondary to to chronic back pain. She also presents with altered gait pattern and posture, decreased trunk and bilateral hip strength, reproduction of symptoms with lumbar extension as well as R and L rotation, and difficulty performing tasks which involves standing and ambulation. Pt will benefit from skilled physical therapy services to address the aforementioned deficits.    Personal Factors and Comorbidities Age;Comorbidity 3+;Fitness;Past/Current Experience;Time since onset of injury/illness/exacerbation    Comorbidities B knee pain, arthritis, chronic kidney disease, COPD, dyspnea, HTN    Examination-Activity Limitations Squat;Lift;Stairs;Locomotion Level;Stand;Reach Overhead;Carry    Stability/Clinical Decision Making  Stable/Uncomplicated    Clinical Decision Making Low    Rehab Potential Fair    PT Frequency 2x / week    PT Duration 8 weeks    PT Treatment/Interventions Therapeutic activities;Therapeutic exercise;Balance training;Neuromuscular re-education;Patient/family education;Manual techniques;Dry needling;Aquatic Therapy;Electrical Stimulation;Iontophoresis 4mg /ml Dexamethasone    PT Next Visit Plan Posture, trunk and hip strengthening, manual techniques, modalities PRN    PT Home Exercise Plan Medbridge Access Code [email or web address]    Consulted and Agree with Plan of Care Patient             Patient will benefit from skilled therapeutic intervention in order to improve the following deficits and impairments:  Pain, Improper body mechanics, Postural dysfunction, Difficulty walking, Decreased strength, Abnormal gait, Decreased activity tolerance  Visit Diagnosis: Chronic bilateral low back pain, unspecified whether sciatica present - Plan: PT plan of care cert/re-cert  Muscle weakness (generalized) - Plan: PT plan of care cert/re-cert  Difficulty in walking, not elsewhere classified - Plan: PT plan of care cert/re-cert  Radiculopathy, lumbar region - Plan: PT plan of care cert/re-cert     Problem List Patient Active Problem List   Diagnosis Date Noted   Essential hypertension 01/23/2021   Cystocele with prolapse 07/30/2020   Postsurgical arthrodesis status 07/25/2020   Facet arthropathy 04/08/2020   Family history of breast cancer 11/16/2019   Body mass index (BMI) 25.0-25.9, adult 07/24/2019   Chronic UTI 12/06/2018   Status post lumbar surgery 12/06/2018   Spondylolisthesis, lumbar region 09/26/2018   Aortic atherosclerosis ([place]) 08/31/2018   Food sensitivity headache 03/29/2018   CAD (coronary artery disease) 02/23/2018   Advanced care planning/counseling discussion 03/28/2017   Varicose veins of both lower extremities with complications 11/07/1599   Arthritis 12/09/2016   Vaginal  atrophy 09/10/2015   Vasomotor symptoms due to menopause 09/10/2015   Onychomycosis due to dermatophyte 04/07/2015   Bursitis of right shoulder 02/26/2015   Environmental and seasonal allergies 02/26/2015   Other allergic rhinitis 02/26/2015   Osteoporosis 11/26/2014   COPD (chronic obstructive pulmonary disease) ([place]) 11/26/2014   Fuchs' corneal dystrophy 11/26/2014   Benign hypertensive renal disease 11/26/2014   Hyperlipidemia 11/26/2014   Chronic kidney disease, stage III (moderate) ([place]) 11/26/2014   Hypothyroidism 11/26/2014   Endothelial corneal dystrophy 11/26/2014   Joneen Boers PT, DPT  07/09/2021, 1:22 PM  [facility] [address] [place], [address] Phone: [phone]   Fax:  [phone]  Name: Kyrstal C Aston MRN: [id number] Date of Birth: 17-Sep-1945

## 2021-07-14 ENCOUNTER — Ambulatory Visit: Payer: Medicare Other

## 2021-07-14 ENCOUNTER — Other Ambulatory Visit: Payer: Self-pay

## 2021-07-14 DIAGNOSIS — R262 Difficulty in walking, not elsewhere classified: Secondary | ICD-10-CM

## 2021-07-14 DIAGNOSIS — M545 Low back pain, unspecified: Secondary | ICD-10-CM | POA: Diagnosis not present

## 2021-07-14 DIAGNOSIS — M5416 Radiculopathy, lumbar region: Secondary | ICD-10-CM

## 2021-07-14 DIAGNOSIS — G8929 Other chronic pain: Secondary | ICD-10-CM

## 2021-07-14 DIAGNOSIS — M6281 Muscle weakness (generalized): Secondary | ICD-10-CM

## 2021-07-14 NOTE — Patient Instructions (Signed)
Access Code: [email or web address] ?URL: [email or web address] ?Date: 07/14/2021 ?Prepared by: Joneen Boers ? ?Exercises ?Seated Transversus Abdominis Bracing - 5 x daily - 7 x weekly - 3 sets - 10 reps - 5 seconds hold ?Seated Trunk Rotation - Arms Crossed - 1 x daily - 7 x weekly - 3 sets - 10 reps - 5 seconds hold ? ?

## 2021-07-14 NOTE — Therapy (Signed)
[facility] [address], [place], [address] Phone: [phone]   Fax:  [phone]  Physical Therapy Treatment  Patient Details  Name: Julia Deleon MRN: [id number] Date of Birth: Jul 12, 1945 Referring Provider (PT): Rosalia Hammers, DO   Encounter Date: 07/14/2021   PT End of Session - 07/14/21 1057     Visit Number 2    Number of Visits 17    Date for PT Re-Evaluation 09/03/21    Authorization Type 2    Authorization Time Period of 10 progress report    PT Start Time 1058    PT Stop Time 1159    PT Time Calculation (min) 61 min    Equipment Utilized During Treatment Gait belt    Activity Tolerance Patient tolerated treatment well    Behavior During Therapy WFL for tasks assessed/performed             Past Medical History:  Diagnosis Date   Arthritis    Chronic kidney disease    COPD (chronic obstructive pulmonary disease) ([place])    pt denies    Dyspnea    Family history of adverse reaction to anesthesia    father was slow to wake up   Fuch's endothelial dystrophy    GERD (gastroesophageal reflux disease)    Hyperlipidemia    Hypertension    Hypothyroidism    Pneumonia    hx of    PONV (postoperative nausea and vomiting)    slow to wake up and PONV    Past Surgical History:  Procedure Laterality Date   ABDOMINAL HYSTERECTOMY     APPENDECTOMY     BACK SURGERY  09/26/2018   L4-5 PLIF by Dr. Arnoldo Morale   CARDIAC CATHETERIZATION     CHOLECYSTECTOMY     COLONOSCOPY WITH PROPOFOL N/A 10/23/2020   Procedure: COLONOSCOPY WITH PROPOFOL;  Surgeon: Virgel Manifold, MD;  Location: [facility] ENDOSCOPY;  Service: Endoscopy;  Laterality: N/A;   ESOPHAGOGASTRODUODENOSCOPY N/A 10/23/2020   Procedure: ESOPHAGOGASTRODUODENOSCOPY (EGD);  Surgeon: Virgel Manifold, MD;  Location: [facility] ENDOSCOPY;  Service: Endoscopy;  Laterality: N/A;   EYE SURGERY     FOOT SURGERY Right    pin removed left   laser vein  surgery     NASAL SINUS SURGERY     RIGHT/LEFT HEART CATH AND CORONARY ANGIOGRAPHY N/A 12/06/2017   Procedure: RIGHT/LEFT HEART CATH AND CORONARY ANGIOGRAPHY;  Surgeon: Yolonda Kida, MD;  Location: [facility];  Service: Cardiovascular;  Laterality: N/A;   ROBOTIC ASSISTED LAPAROSCOPIC SACROCOLPOPEXY Bilateral 07/30/2020   Procedure: XI ROBOTIC ASSISTED LAPAROSCOPIC SACROCOLPOPEXY AND SUPRACERVICAL HYSTERECTOMY WITH BILATERAL SALPINGO OOPHERECTOMY;  Surgeon: Ardis Hughs, MD;  Location: [facility];  Service: Urology;  Laterality: Bilateral;  REQUESTING 4 HRS   TONSILLECTOMY      There were no vitals filed for this visit.   Subjective Assessment - 07/14/21 1059     Subjective Back is about the same. 3/10 currently. Increases as the day progresses. Has been sitting for the last hour. R LE is tighther than the L in general. Works with a Physiological scientist at [employer], does planks, lat pull downs, lateral arm raises, etc. Whitney Meeler told her to hold off and do PT first.    Pertinent History Chronic low back pain. Had L4/5 fusion in her low back May 2020 which helped her sleep. Had PT following surgery which helped. Bowel and bladder are not perfect but improving. Had pelvic floor  therapy which did not help. The bowel issues might have started after the back surgery. Feels like her urinary tract system quit due to the pain after back surgery. Was placed on a catheter for 6 months after the surgery. Denies saddle anesthesia. Pt also states having a kidney infection May 2022 and was diagnosed at the end of [place] 2022. Took all summer to get rid of the kidney infection which wiped her out and weakned her back.    Patient Stated Goals Not use her back brace. Want to walk further.    Currently in Pain? Yes    Pain Score 3     Pain Onset More than a month ago                                        PT Education - 07/14/21 1154     Education Details ther-ex     Person(s) Educated Patient    Methods Explanation;Demonstration;Tactile cues;Verbal cues;Handout    Comprehension Returned demonstration;Verbalized understanding              Objective       Latex allergies     Blood pressure L arm sitting, mechanically taken, normal cuff: 136/44, HR 56   Wears compression hose B LE secondary to varicouse veins     Medbridge Access Code [email or web address]   Posture: Slight R trunk rotation, R iliac crest higher, slight L lateral shift around T12/L1 area, slight L posterior pelvic rotation. Backward lean (hanging on the "Y")    Therapeutic exercise    Seated transversus abdominis contractoin. 10x5 seconds    Hooklying posterior pelvic tilt 9x5 seconds  B posterior thigh tightness  Hooklying hip extension isometrics, leg straight  R 10x5 seconds for 2 sets  L 10x5 seconds for 2 sets  Hooklying B scapular retraction 10x5 seconds. R LE tightness felt.   Seated manually resisted R lateral shift isometrics at R lumbar spine to counter L lateral shift posture 10x5 seconds for 3 sets  Seated manually resisted R upper trunk rotation isometrics, to promote more neutral lumbar posture 10x5 second holds for 3 sets  Seated trunk rotation   L 10x5 seconds for 2 sets. Centralization of R L5 symptoms.   Reviewed and given as part of her HEP. Pt demonstrated and verbalized understanding.   Pt education on L5 dermatome, lumbar posture, impingement, and R L5 symptoms. Visuals utilized. Pt verbalized understanding.      Improved exercise technique, movement at target joints, use of target muscles after mod verbal, visual, tactile cues.        Response to treatment Pt tolerated session well without aggravation of symptoms.      Clinical impression Decreased R L5 dermatome symptoms with treatment to decrease R lumbar rotation posture to improve R L5 intervertebral foraminal space. Pt tolerated session well without aggravation of symptoms. Pt will  benefit from continued skilled physical therapy services to decrease pain, improve strength and function.        PT Short Term Goals - 07/09/21 1305       PT SHORT TERM GOAL #1   Title Pt will be independent with her initial HEP to decrease pain, improve strength and ability to ambulate longer distances.    Baseline Pt has started her initial HEP (07/09/2021)    Time 3    Period Weeks    Status New  Target Date 07/30/21               PT Long Term Goals - 07/09/21 1309       PT LONG TERM GOAL #1   Title Pt will have a decrease in low back pain to 4/10 or less at worst to promote ability to ambulate longer distances as well as perform standing tasks more comfortably.    Baseline 9/10 low back pain at most for the past 3 months. Pt also demonstrates B L5 radiating symptoms to knees (07/09/2021)    Time 8    Period Weeks    Status New    Target Date 09/03/21      PT LONG TERM GOAL #2   Title Pt will improve B hip extension, abduction and ER strength by at least 1/2 MMT grade to promote ability to perform standing tasks with less back pain.    Baseline Hip extension 4+/5 R, 3+/5 L, hip abduction 4/5 R,  4-/5 L, hip ER 4-/5 R, 3+/5 L (07/09/2021)    Time 8    Period Weeks    Status New    Target Date 09/03/21      PT LONG TERM GOAL #3   Title Pt will improve her lumbar FOTO score by at least 10 points as a demonstration of improved function.    Baseline Lumbar Spine FOTO 44 (07/09/2021)    Time 8    Period Weeks    Status New    Target Date 09/03/21                   Plan - 07/14/21 1205     Clinical Impression Statement Decreased R L5 dermatome symptoms with treatment to decrease R lumbar rotation posture to improve R L5 intervertebral foraminal space. Pt tolerated session well without aggravation of symptoms. Pt will benefit from continued skilled physical therapy services to decrease pain, improve strength and function.    Personal Factors and Comorbidities  Age;Comorbidity 3+;Fitness;Past/Current Experience;Time since onset of injury/illness/exacerbation    Comorbidities B knee pain, arthritis, chronic kidney disease, COPD, dyspnea, HTN    Examination-Activity Limitations Squat;Lift;Stairs;Locomotion Level;Stand;Reach Overhead;Carry    Stability/Clinical Decision Making Stable/Uncomplicated    Clinical Decision Making Low    Rehab Potential Fair    PT Frequency 2x / week    PT Duration 8 weeks    PT Treatment/Interventions Therapeutic activities;Therapeutic exercise;Balance training;Neuromuscular re-education;Patient/family education;Manual techniques;Dry needling;Aquatic Therapy;Electrical Stimulation;Iontophoresis '4mg'$ /ml Dexamethasone    PT Next Visit Plan Posture, trunk and hip strengthening, manual techniques, modalities PRN    PT Home Exercise Plan Medbridge Access Code [email or web address]    Consulted and Agree with Plan of Care Patient             Patient will benefit from skilled therapeutic intervention in order to improve the following deficits and impairments:  Pain, Improper body mechanics, Postural dysfunction, Difficulty walking, Decreased strength, Abnormal gait, Decreased activity tolerance  Visit Diagnosis: Chronic bilateral low back pain, unspecified whether sciatica present  Muscle weakness (generalized)  Difficulty in walking, not elsewhere classified  Radiculopathy, lumbar region     Problem List Patient Active Problem List   Diagnosis Date Noted   Essential hypertension 01/23/2021   Cystocele with prolapse 07/30/2020   Postsurgical arthrodesis status 07/25/2020   Facet arthropathy 04/08/2020   Family history of breast cancer 11/16/2019   Body mass index (BMI) 25.0-25.9, adult 07/24/2019   Chronic UTI 12/06/2018   Status post lumbar surgery 12/06/2018   Spondylolisthesis,  lumbar region 09/26/2018   Aortic atherosclerosis ([place]) 08/31/2018   Food sensitivity headache 03/29/2018   CAD (coronary artery disease)  02/23/2018   Advanced care planning/counseling discussion 03/28/2017   Varicose veins of both lower extremities with complications 76/28/3151   Arthritis 12/09/2016   Vaginal atrophy 09/10/2015   Vasomotor symptoms due to menopause 09/10/2015   Onychomycosis due to dermatophyte 04/07/2015   Bursitis of right shoulder 02/26/2015   Environmental and seasonal allergies 02/26/2015   Other allergic rhinitis 02/26/2015   Osteoporosis 11/26/2014   COPD (chronic obstructive pulmonary disease) ([place]) 11/26/2014   Fuchs' corneal dystrophy 11/26/2014   Benign hypertensive renal disease 11/26/2014   Hyperlipidemia 11/26/2014   Chronic kidney disease, stage III (moderate) ([place]) 11/26/2014   Hypothyroidism 11/26/2014   Endothelial corneal dystrophy 11/26/2014   Joneen Boers PT, DPT  07/14/2021, 12:07 PM  [facility] [address] [place], [address] Phone: [phone]   Fax:  [phone]  Name: Manmeet C Dawood MRN: [id number] Date of Birth: [birth year]

## 2021-07-16 ENCOUNTER — Ambulatory Visit: Payer: Medicare Other

## 2021-07-16 ENCOUNTER — Other Ambulatory Visit: Payer: Self-pay

## 2021-07-16 DIAGNOSIS — G8929 Other chronic pain: Secondary | ICD-10-CM

## 2021-07-16 DIAGNOSIS — M5416 Radiculopathy, lumbar region: Secondary | ICD-10-CM

## 2021-07-16 DIAGNOSIS — R262 Difficulty in walking, not elsewhere classified: Secondary | ICD-10-CM

## 2021-07-16 DIAGNOSIS — M6281 Muscle weakness (generalized): Secondary | ICD-10-CM

## 2021-07-16 DIAGNOSIS — M545 Low back pain, unspecified: Secondary | ICD-10-CM | POA: Diagnosis not present

## 2021-07-16 NOTE — Therapy (Signed)
[facility] [address], [place], [address] Phone: [phone]   Fax:  [phone]  Physical Therapy Treatment  Patient Details  Name: Julia Deleon MRN: [id number] Date of Birth: 1945/05/25 Referring Provider (PT): Rosalia Hammers, DO   Encounter Date: 07/16/2021   PT End of Session - 07/16/21 1104     Visit Number 3    Number of Visits 17    Date for PT Re-Evaluation 09/03/21    Authorization Type 3    Authorization Time Period of 10 progress report    PT Start Time 1104    PT Stop Time 1152    PT Time Calculation (min) 48 min    Equipment Utilized During Treatment --    Activity Tolerance Patient tolerated treatment well    Behavior During Therapy WFL for tasks assessed/performed             Past Medical History:  Diagnosis Date   Arthritis    Chronic kidney disease    COPD (chronic obstructive pulmonary disease) ([place])    pt denies    Dyspnea    Family history of adverse reaction to anesthesia    father was slow to wake up   Fuch's endothelial dystrophy    GERD (gastroesophageal reflux disease)    Hyperlipidemia    Hypertension    Hypothyroidism    Pneumonia    hx of    PONV (postoperative nausea and vomiting)    slow to wake up and PONV    Past Surgical History:  Procedure Laterality Date   ABDOMINAL HYSTERECTOMY     APPENDECTOMY     BACK SURGERY  09/26/2018   L4-5 PLIF by Dr. Arnoldo Morale   CARDIAC CATHETERIZATION     CHOLECYSTECTOMY     COLONOSCOPY WITH PROPOFOL N/A 10/23/2020   Procedure: COLONOSCOPY WITH PROPOFOL;  Surgeon: Virgel Manifold, MD;  Location: [facility] ENDOSCOPY;  Service: Endoscopy;  Laterality: N/A;   ESOPHAGOGASTRODUODENOSCOPY N/A 10/23/2020   Procedure: ESOPHAGOGASTRODUODENOSCOPY (EGD);  Surgeon: Virgel Manifold, MD;  Location: [facility] ENDOSCOPY;  Service: Endoscopy;  Laterality: N/A;   EYE SURGERY     FOOT SURGERY Right    pin removed left   laser vein surgery      NASAL SINUS SURGERY     RIGHT/LEFT HEART CATH AND CORONARY ANGIOGRAPHY N/A 12/06/2017   Procedure: RIGHT/LEFT HEART CATH AND CORONARY ANGIOGRAPHY;  Surgeon: Yolonda Kida, MD;  Location: [facility];  Service: Cardiovascular;  Laterality: N/A;   ROBOTIC ASSISTED LAPAROSCOPIC SACROCOLPOPEXY Bilateral 07/30/2020   Procedure: XI ROBOTIC ASSISTED LAPAROSCOPIC SACROCOLPOPEXY AND SUPRACERVICAL HYSTERECTOMY WITH BILATERAL SALPINGO OOPHERECTOMY;  Surgeon: Ardis Hughs, MD;  Location: [facility];  Service: Urology;  Laterality: Bilateral;  REQUESTING 4 HRS   TONSILLECTOMY      There were no vitals filed for this visit.   Subjective Assessment - 07/16/21 1105     Subjective Pt provided list of exercises she does at her gym with trainer.  No back pain or LE tightness currently. Her tummy sagged more after her hysterectomy surgery.    Pertinent History Chronic low back pain. Had L4/5 fusion in her low back May 2020 which helped her sleep. Had PT following surgery which helped. Bowel and bladder are not perfect but improving. Had pelvic floor therapy which did not help. The bowel issues might have started after the back surgery. Feels like her urinary tract system quit due to the pain after back surgery.  Was placed on a catheter for 6 months after the surgery. Denies saddle anesthesia. Pt also states having a kidney infection May 2022 and was diagnosed at the end of [place] 2022. Took all summer to get rid of the kidney infection which wiped her out and weakned her back.    Patient Stated Goals Not use her back brace. Want to walk further.    Currently in Pain? No/denies    Pain Onset More than a month ago                                        PT Education - 07/16/21 1313     Education Details ther-ex    [employer]) Educated Patient    Methods Explanation;Demonstration;Tactile cues;Verbal cues    Comprehension Returned demonstration;Verbalized understanding             Objective        Latex allergies    Wears compression hose B LE secondary to varicouse veins     Medbridge Access Code [email or web address]     Posture: Slight R trunk rotation, R iliac crest higher, slight L lateral shift around T12/L1 area, slight L posterior pelvic rotation. Backward lean (hanging on the "Y")      List of exercises at the gym with trainer:  Lat pulldowns Light, 40 lbs, 10x3  Angled (wall planks) 60-90 seconds x 3  (Seated) Vertical bench press 40 lbs, 10x3  (Seated pench press machine, pt pushes forward)   Leg extensions 50 lbs 10x3   High pulley rows 35 lbs, 10x3 (Face pulls)  Seated dumbbell side lateral raise 5 lbs 10x3 (single arm)  Seated dumbbell curls (alternating arms) 10 lbs 10x3  Standing calf raises (standing on edge of block) 15x3  Triceps press downs 30 lbs 10x3     Therapeutic exercise   Pt was recommended to let PT know if any of her gym exercises increase her low back symptoms and B LE tightness. Pt verbalized understanding.    Seated trunk rotation              L 10x5 seconds for 2 sets.   Slight R thigh tightness afterwards   Seated manually resisted R lateral shift isometrics at R lumbar spine to counter L lateral shift posture 10x5 seconds for 3 sets   Seated manually resisted R upper trunk rotation isometrics, to promote more neutral lumbar posture 10x5 second holds for 3 sets    Seated chin tucks to promote upper thoracic extension to decrease low back pressure 10x5 seconds for 2 sets  Seated L trunk side bend 10x5 seconds. Increased R LE symptoms to R lateral leg  Seated R trunk side bend 10x5 seconds. Decreased R LE symptoms but increased L L5 tightness  Seated with upright posture 10x10 seconds   Seated transversus abdominis contractoin. 10x5 seconds for 2 sets  Difficulty performing observed.     Improved exercise technique, movement at target joints, use of target muscles after mod verbal, visual,  tactile cues.        Response to treatment Pt tolerated session well without aggravation of symptoms.      Clinical impression Demonstrates R > L L5 dermatome tighness today with L trunk rotation as well as L and R trunk side bending. Difficulty alleviating LE tightness today. No reports of pain however. Continued working on transversus abdominis muscle activation as well secondary to  weakness observed, palpated and based on subjective of possible trunk weakness after her hysterectomy. Fair tolerance to today's session. Pt will benefit from continued skilled physical therapy services to decrease pain, improve strength and function.        PT Short Term Goals - 07/09/21 1305       PT SHORT TERM GOAL #1   Title Pt will be independent with her initial HEP to decrease pain, improve strength and ability to ambulate longer distances.    Baseline Pt has started her initial HEP (07/09/2021)    Time 3    Period Weeks    Status New    Target Date 07/30/21               PT Long Term Goals - 07/09/21 1309       PT LONG TERM GOAL #1   Title Pt will have a decrease in low back pain to 4/10 or less at worst to promote ability to ambulate longer distances as well as perform standing tasks more comfortably.    Baseline 9/10 low back pain at most for the past 3 months. Pt also demonstrates B L5 radiating symptoms to knees (07/09/2021)    Time 8    Period Weeks    Status New    Target Date 09/03/21      PT LONG TERM GOAL #2   Title Pt will improve B hip extension, abduction and ER strength by at least 1/2 MMT grade to promote ability to perform standing tasks with less back pain.    Baseline Hip extension 4+/5 R, 3+/5 L, hip abduction 4/5 R,  4-/5 L, hip ER 4-/5 R, 3+/5 L (07/09/2021)    Time 8    Period Weeks    Status New    Target Date 09/03/21      PT LONG TERM GOAL #3   Title Pt will improve her lumbar FOTO score by at least 10 points as a demonstration of improved function.     Baseline Lumbar Spine FOTO 44 (07/09/2021)    Time 8    Period Weeks    Status New    Target Date 09/03/21                   Plan - 07/16/21 1103     Clinical Impression Statement Demonstrates R > L L5 dermatome tighness today with L trunk rotation as well as L and R trunk side bending. Difficulty alleviating LE tightness today. No reports of pain however. Continued working on transversus abdominis muscle activation as well secondary to weakness observed, palpated and based on subjective of possible trunk weakness after her hysterectomy. Fair tolerance to today's session. Pt will benefit from continued skilled physical therapy services to decrease pain, improve strength and function.    Personal Factors and Comorbidities Age;Comorbidity 3+;Fitness;Past/Current Experience;Time since onset of injury/illness/exacerbation    Comorbidities B knee pain, arthritis, chronic kidney disease, COPD, dyspnea, HTN    Examination-Activity Limitations Squat;Lift;Stairs;Locomotion Level;Stand;Reach Overhead;Carry    Stability/Clinical Decision Making Stable/Uncomplicated    Rehab Potential Fair    PT Frequency 2x / week    PT Duration 8 weeks    PT Treatment/Interventions Therapeutic activities;Therapeutic exercise;Balance training;Neuromuscular re-education;Patient/family education;Manual techniques;Dry needling;Aquatic Therapy;Electrical Stimulation;Iontophoresis '4mg'$ /ml Dexamethasone    PT Next Visit Plan Posture, trunk and hip strengthening, manual techniques, modalities PRN    PT Home Exercise Plan Medbridge Access Code [email or web address]    Consulted and Agree with Plan of Care Patient  Patient will benefit from skilled therapeutic intervention in order to improve the following deficits and impairments:  Pain, Improper body mechanics, Postural dysfunction, Difficulty walking, Decreased strength, Abnormal gait, Decreased activity tolerance  Visit Diagnosis: Chronic bilateral low back pain,  unspecified whether sciatica present  Muscle weakness (generalized)  Difficulty in walking, not elsewhere classified  Radiculopathy, lumbar region     Problem List Patient Active Problem List   Diagnosis Date Noted   Essential hypertension 01/23/2021   Cystocele with prolapse 07/30/2020   Postsurgical arthrodesis status 07/25/2020   Facet arthropathy 04/08/2020   Family history of breast cancer 11/16/2019   Body mass index (BMI) 25.0-25.9, adult 07/24/2019   Chronic UTI 12/06/2018   Status post lumbar surgery 12/06/2018   Spondylolisthesis, lumbar region 09/26/2018   Aortic atherosclerosis ([place]) 08/31/2018   Food sensitivity headache 03/29/2018   CAD (coronary artery disease) 02/23/2018   Advanced care planning/counseling discussion 03/28/2017   Varicose veins of both lower extremities with complications 83/38/2505   Arthritis 12/09/2016   Vaginal atrophy 09/10/2015   Vasomotor symptoms due to menopause 09/10/2015   Onychomycosis due to dermatophyte 04/07/2015   Bursitis of right shoulder 02/26/2015   Environmental and seasonal allergies 02/26/2015   Other allergic rhinitis 02/26/2015   Osteoporosis 11/26/2014   COPD (chronic obstructive pulmonary disease) ([place]) 11/26/2014   Fuchs' corneal dystrophy 11/26/2014   Benign hypertensive renal disease 11/26/2014   Hyperlipidemia 11/26/2014   Chronic kidney disease, stage III (moderate) ([place]) 11/26/2014   Hypothyroidism 11/26/2014   Endothelial corneal dystrophy 11/26/2014   Joneen Boers PT, DPT  07/16/2021, 1:16 PM  [facility] [address] [place], [address] Phone: [phone]   Fax:  [phone]  Name: Mallika C Weipert MRN: [id number] Date of Birth: 01-24-1946

## 2021-07-21 ENCOUNTER — Ambulatory Visit: Payer: Medicare Other

## 2021-07-21 ENCOUNTER — Other Ambulatory Visit: Payer: Self-pay

## 2021-07-21 DIAGNOSIS — M545 Low back pain, unspecified: Secondary | ICD-10-CM

## 2021-07-21 DIAGNOSIS — R262 Difficulty in walking, not elsewhere classified: Secondary | ICD-10-CM

## 2021-07-21 DIAGNOSIS — M6281 Muscle weakness (generalized): Secondary | ICD-10-CM

## 2021-07-21 DIAGNOSIS — M5416 Radiculopathy, lumbar region: Secondary | ICD-10-CM

## 2021-07-21 NOTE — Therapy (Signed)
[facility] ?[address] ?[place], [place], [address] ?Phone: [phone]   Fax:  [phone] ? ?Physical Therapy Treatment ? ?Patient Details  ?Name: Julia Deleon ?MRN: [id number] ?Date of Birth: 05-16-1945 ?Referring Provider (PT): Rosalia Hammers, DO ? ? ?Encounter Date: 07/21/2021 ? ? PT End of Session - 07/21/21 1021   ? ? Visit Number 4   ? Number of Visits 17   ? Date for PT Re-Evaluation 09/03/21   ? Authorization Type 4   ? Authorization Time Period of 10 progress report   ? PT Start Time 1021   ? PT Stop Time 7048   ? PT Time Calculation (min) 42 min   ? Activity Tolerance Patient tolerated treatment well   ? Behavior During Therapy [facility] for tasks assessed/performed   ? ?  ?  ? ?  ? ? ?Past Medical History:  ?Diagnosis Date  ? Arthritis   ? Chronic kidney disease   ? COPD (chronic obstructive pulmonary disease) ([place])   ? pt denies   ? Dyspnea   ? Family history of adverse reaction to anesthesia   ? father was slow to wake up  ? Fuch's endothelial dystrophy   ? GERD (gastroesophageal reflux disease)   ? Hyperlipidemia   ? Hypertension   ? Hypothyroidism   ? Pneumonia   ? hx of   ? PONV (postoperative nausea and vomiting)   ? slow to wake up and PONV  ? ? ?Past Surgical History:  ?Procedure Laterality Date  ? ABDOMINAL HYSTERECTOMY    ? APPENDECTOMY    ? BACK SURGERY  09/26/2018  ? L4-5 PLIF by Dr. Arnoldo Morale  ? CARDIAC CATHETERIZATION    ? CHOLECYSTECTOMY    ? COLONOSCOPY WITH PROPOFOL N/A 10/23/2020  ? Procedure: COLONOSCOPY WITH PROPOFOL;  Surgeon: Virgel Manifold, MD;  Location: [facility] ENDOSCOPY;  Service: Endoscopy;  Laterality: N/A;  ? ESOPHAGOGASTRODUODENOSCOPY N/A 10/23/2020  ? Procedure: ESOPHAGOGASTRODUODENOSCOPY (EGD);  Surgeon: Virgel Manifold, MD;  Location: [facility] END[facility]COPY;  Service: Endoscopy;  Laterality: N/A;  ? EYE SURGERY    ? FOOT SURGERY Right   ? pin removed left  ? laser vein surgery    ? NASAL SINUS SURGERY    ? RIGHT/LEFT  HEART CATH AND CORONARY ANGIOGRAPHY N/A 12/06/2017  ? Procedure: RIGHT/LEFT HEART CATH AND CORONARY ANGIOGRAPHY;  Surgeon: Yolonda Kida, MD;  Location: [facility];  Service: Cardiovascular;  Laterality: N/A;  ? ROBOTIC ASSISTED LAPAROSCOPIC SACROCOLPOPEXY Bilateral 07/30/2020  ? Procedure: XI ROBOTIC ASSISTED LAPAROSCOPIC SACROCOLPOPEXY AND SUPRACERVICAL HYSTERECTOMY WITH BILATERAL SALPINGO OOPHERECTOMY;  Surgeon: Ardis Hughs, MD;  Location: [facility];  Service: Urology;  Laterality: Bilateral;  REQUESTING 4 HRS  ? TONSILLECTOMY    ? ? ?There were no vitals filed for this visit. ? ? Subjective Assessment - 07/21/21 1022   ? ? Subjective Back feels weak when she gets up in the morning. R lateral thigh feels tight after driving in her car this morning.   ? Pertinent History Chronic low back pain. Had L4/5 fusion in her low back May 2020 which helped her sleep. Had PT following surgery which helped. Bowel and bladder are not perfect but improving. Had pelvic floor therapy which did not help. The bowel issues might have started after the back surgery. Feels like her urinary tract system quit due to the pain after back surgery. Was placed on a catheter for 6 months after the surgery. Denies saddle anesthesia.  Pt also states having a kidney infection May 2022 and was diagnosed at the end of [place] 2022. Took all summer to get rid of the kidney infection which wiped her out and weakned her back.   ? Patient Stated Goals Not use her back brace. Want to walk further.   ? Currently in Pain? No/denies   ? Pain Onset More than a month ago   ? ?  ?  ? ?  ? ? ? ? ? ? ? ? ? ? ? ? ? ? ? ? ? ? ? ? ? ? ? ? ? ? ? ? ? PT Education - 07/21/21 1427   ? ? Education Details ther-ex   ? Person(s) Educated Patient   ? Methods Explanation;Demonstration;Tactile cues;Verbal cues   ? Comprehension Returned demonstration;Verbalized understanding   ? ?  ?  ? ?  ?  ?Objective  ?  ?  ?  ?Latex allergies ?  ? Wears compression hose B  LE secondary to varicouse veins ?  ?  ?Medbridge Access Code [email or web address] ?  ?  ?Posture: Slight R trunk rotation, R iliac crest higher, slight L lateral shift around T12/L1 area, slight L posterior pelvic rotation. Backward lean (hanging on the "Y")  ?  ?  ?List of exercises at the gym with trainer: ?  ?Lat pulldowns Light, 40 lbs, 10x3 ?  ?Angled (wall planks) 60-90 seconds x 3 ?  ?(Seated) Vertical bench press 40 lbs, 10x3 ?            (Seated pench press machine, pt pushes forward)  ?  ?Leg extensions 50 lbs 10x3 ?  ?  ?High pulley rows 35 lbs, 10x3 ?(Face pulls) ?  ?Seated dumbbell side lateral raise 5 lbs 10x3 (single arm) ?  ?Seated dumbbell curls (alternating arms) 10 lbs 10x3 ?  ?Standing calf raises (standing on edge of block) 15x3 ?  ?Triceps press downs 30 lbs 10x3 ?  ?  ?  ?  ?Therapeutic exercise ?  ?Seated hip adduction isometrics small physio ball squeeze 10x5 seconds for 3 sets, L lateral thigh tightness ? ?Seated manually resisted R lateral shift isometrics at R lumbar spine to counter L lateral shift posture 10x5 seconds for 3 sets ? ? ?Seated trunk flexion stretch 10x5 seconds for 2 sets. B leg tightness ? ?Sitting with lumbar towel roll  ? R L4 tightness around knee area.  ? ?Seated hip extension isometrics  ? L 10x5 seconds for 2 sets ? ?Standing: improved centralization of L5 tightness.  ? ? ? ? ?Improved exercise technique, movement at target joints, use of target muscles after mod verbal, visual, tactile cues.  ?  ?Manual therapy ?Seated STM B lumbar thoracic paraspinal and quadratus lumborum muscles to decrease tension  ? Decreased R leg tightness  ? ? ? ? ?  ?Response to treatment ?Fair tolerance to today's session.  ?  ?  ?Clinical impression ?Increased B L5 tighness symptoms with trunk flexion. Centralization of tightness with standing for gentle extension of low back suggesting gentle extension preference today for her back. Fair tolerance to today's session. Pt will benefit from continued  skilled physical therapy services to decrease pain, improve strength and function.  ?  ? ? PT Short Term Goals - 07/09/21 1305   ? ?  ? PT SHORT TERM GOAL #1  ? Title Pt will be independent with her initial HEP to decrease pain, improve strength and ability to ambulate longer distances.   ? Baseline Pt  has started her initial HEP (07/09/2021)   ? Time 3   ? Period Weeks   ? Status New   ? Target Date 07/30/21   ? ?  ?  ? ?  ? ? ? ? PT Long Term Goals - 07/09/21 1309   ? ?  ? PT LONG TERM GOAL #1  ? Title Pt will have a decrease in low back pain to 4/10 or less at worst to promote ability to ambulate longer distances as well as perform standing tasks more comfortably.   ? Baseline 9/10 low back pain at most for the past 3 months. Pt also demonstrates B L5 radiating symptoms to knees (07/09/2021)   ? Time 8   ? Period Weeks   ? Status New   ? Target Date 09/03/21   ?  ? PT LONG TERM GOAL #2  ? Title Pt will improve B hip extension, abduction and ER strength by at least 1/2 MMT grade to promote ability to perform standing tasks with less back pain.   ? Baseline Hip extension 4+/5 R, 3+/5 L, hip abduction 4/5 R,  4-/5 L, hip ER 4-/5 R, 3+/5 L (07/09/2021)   ? Time 8   ? Period Weeks   ? Status New   ? Target Date 09/03/21   ?  ? PT LONG TERM GOAL #3  ? Title Pt will improve her lumbar FOTO score by at least 10 points as a demonstration of improved function.   ? Baseline Lumbar Spine FOTO 44 (07/09/2021)   ? Time 8   ? Period Weeks   ? Status New   ? Target Date 09/03/21   ? ?  ?  ? ?  ? ? ? ? ? ? ? ? Plan - 07/21/21 1428   ? ? Clinical Impression Statement Increased B L5 tighness symptoms with trunk flexion. Centralization of tightness with standing for gentle extension of low back suggesting gentle extension preference today for her back. Fair tolerance to today's session. Pt will benefit from continued skilled physical therapy services to decrease pain, improve strength and function.   ? Personal Factors and Comorbidities  Age;Comorbidity 3+;Fitness;Past/Current Experience;Time since onset of injury/illness/exacerbation   ? Comorbidities B knee pain, arthritis, chronic kidney disease, COPD, dyspnea, HTN   ? Examination-Ac

## 2021-07-23 ENCOUNTER — Ambulatory Visit: Payer: Medicare Other

## 2021-07-23 ENCOUNTER — Other Ambulatory Visit: Payer: Self-pay

## 2021-07-23 DIAGNOSIS — M545 Low back pain, unspecified: Secondary | ICD-10-CM | POA: Diagnosis not present

## 2021-07-23 NOTE — Therapy (Signed)
[facility] ?[address] ?[place], [place], [address] ?Phone: [phone]   Fax:  [phone] ? ?Physical Therapy Treatment ? ?Patient Details  ?Name: Julia Deleon ?MRN: [id number] ?Date of Birth: Oct 04, 1945 ?Referring Provider (PT): Rosalia Hammers, DO ? ? ?Encounter Date: 07/23/2021 ? ? PT End of Session - 07/23/21 1633   ? ? Visit Number 5   ? Number of Visits 17   ? Date for PT Re-Evaluation 09/03/21   ? Authorization Type 5   ? Authorization Time Period of 10 progress report   ? PT Start Time 1607   ? PT Stop Time 3710   ? PT Time Calculation (min) 41 min   ? Activity Tolerance Patient tolerated treatment well   ? Behavior During Therapy [facility] for tasks assessed/performed   ? ?  ?  ? ?  ? ? ?Past Medical History:  ?Diagnosis Date  ? Arthritis   ? Chronic kidney disease   ? COPD (chronic obstructive pulmonary disease) ([place])   ? pt denies   ? Dyspnea   ? Family history of adverse reaction to anesthesia   ? father was slow to wake up  ? Fuch's endothelial dystrophy   ? GERD (gastroesophageal reflux disease)   ? Hyperlipidemia   ? Hypertension   ? Hypothyroidism   ? Pneumonia   ? hx of   ? PONV (postoperative nausea and vomiting)   ? slow to wake up and PONV  ? ? ?Past Surgical History:  ?Procedure Laterality Date  ? ABDOMINAL HYSTERECTOMY    ? APPENDECTOMY    ? BACK SURGERY  09/26/2018  ? L4-5 PLIF by Dr. Arnoldo Morale  ? CARDIAC CATHETERIZATION    ? CHOLECYSTECTOMY    ? COLONOSCOPY WITH PROPOFOL N/A 10/23/2020  ? Procedure: COLONOSCOPY WITH PROPOFOL;  Surgeon: Virgel Manifold, MD;  Location: [facility] ENDOSCOPY;  Service: Endoscopy;  Laterality: N/A;  ? ESOPHAGOGASTRODUODENOSCOPY N/A 10/23/2020  ? Procedure: ESOPHAGOGASTRODUODENOSCOPY (EGD);  Surgeon: Virgel Manifold, MD;  Location: [facility] ENDOSCOPY;  Service: Endoscopy;  Laterality: N/A;  ? EYE SURGERY    ? FOOT SURGERY Right   ? pin removed left  ? laser vein surgery    ? NASAL SINUS SURGERY    ? RIGHT/LEFT  HEART CATH AND CORONARY ANGIOGRAPHY N/A 12/06/2017  ? Procedure: RIGHT/LEFT HEART CATH AND CORONARY ANGIOGRAPHY;  Surgeon: Yolonda Kida, MD;  Location: [facility];  Service: Cardiovascular;  Laterality: N/A;  ? ROBOTIC ASSISTED LAPAROSCOPIC SACROCOLPOPEXY Bilateral 07/30/2020  ? Procedure: XI ROBOTIC ASSISTED LAPAROSCOPIC SACROCOLPOPEXY AND SUPRACERVICAL HYSTERECTOMY WITH BILATERAL SALPINGO OOPHERECTOMY;  Surgeon: Ardis Hughs, MD;  Location: [facility];  Service: Urology;  Laterality: Bilateral;  REQUESTING 4 HRS  ? TONSILLECTOMY    ? ? ?There were no vitals filed for this visit. ? ? Subjective Assessment - 07/23/21 1635   ? ? Subjective Most tired at the end of the day. Did okay driving. Slighly tight R L5 to proximal half of tibia.   ? Pertinent History Chronic low back pain. Had L4/5 fusion in her low back May 2020 which helped her sleep. Had PT following surgery which helped. Bowel and bladder are not perfect but improving. Had pelvic floor therapy which did not help. The bowel issues might have started after the back surgery. Feels like her urinary tract system quit due to the pain after back surgery. Was placed on a catheter for 6 months after the surgery. Denies saddle anesthesia. Pt also  states having a kidney infection May 2022 and was diagnosed at the end of [place] 2022. Took all summer to get rid of the kidney infection which wiped her out and weakned her back.   ? Patient Stated Goals Not use her back brace. Want to walk further.   ? Currently in Pain? No/denies   ? Pain Onset More than a month ago   ? ?  ?  ? ?  ? ? ? ? ? ? ? ? ? ? ? ? ? ? ? ? ? ? ? ? ? ? ? ? ? ? ? ? ? PT Education - 07/23/21 1644   ? ? Education Details ther-ex   ? Person(s) Educated Patient   ? Methods Explanation;Demonstration;Tactile cues;Verbal cues   ? Comprehension Returned demonstration;Verbalized understanding   ? ?  ?  ? ?  ? ?Objective  ?  ?  ?  ?Latex allergies ?  ? Wears compression hose B LE secondary to  varicouse veins ?  ?  ?Medbridge Access Code [email or web address] ?  ?  ?Posture: Slight R trunk rotation, R iliac crest higher, slight L lateral shift around T12/L1 area, slight L posterior pelvic rotation. Backward lean (hanging on the "Y")  ?  ?  ?List of exercises at the gym with trainer: ?  ?Lat pulldowns Light, 40 lbs, 10x3 ?  ?Angled (wall planks) 60-90 seconds x 3 ?  ?(Seated) Vertical bench press 40 lbs, 10x3 ?            (Seated pench press machine, pt pushes forward)  ?  ?Leg extensions 50 lbs 10x3 ?  ?  ?High pulley rows 35 lbs, 10x3 ?(Face pulls) ?  ?Seated dumbbell side lateral raise 5 lbs 10x3 (single arm) ?  ?Seated dumbbell curls (alternating arms) 10 lbs 10x3 ?  ?Standing calf raises (standing on edge of block) 15x3 ?  ?Triceps press downs 30 lbs 10x3 ?  ?  ?  ?  ?Therapeutic exercise ?  ?Standing B shoulder extension red band 10x ? ?Standing glute max squeeze 10x5 seconds with B shoulder extension red band for 2 sets ? ?Seated B scapular retraction 10x5 seconds for 3 sets ? ?Seated hip extension isometrics alternating 10x5 seconds each LE for 2 sets ? ?Seated B ankle DF/PF to promote LE neural flossing 10x3 ? ?Seated transversus abdominis contraction 10x5 seconds for 3 sets ? ?  ?Improved exercise technique, movement at target joints, use of target muscles after mod verbal, visual, tactile cues.  ?  ? ?Manual therapy ?Seated STM B lumbar thoracic paraspinal and quadratus lumborum muscles to decrease tension  ?             ?  ?  ?  ?  ?Response to treatment ?Fair tolerance to today's session.  ?  ?  ?Clinical impression ?Improved ability to activate transversus abdominis muscles observed and palpated. Continued working on improving trunk and glute strength to help decrease stress to low back. Fair tolerance to today's session. Pt will benefit from continued skilled physical therapy services to decrease pain, improve strength and function.  ?  ? ? ? PT Short Term Goals - 07/09/21 1305   ? ?  ? PT SHORT TERM  GOAL #1  ? Title Pt will be independent with her initial HEP to decrease pain, improve strength and ability to ambulate longer distances.   ? Baseline Pt has started her initial HEP (07/09/2021)   ? Time 3   ? Period Weeks   ?  Status New   ? Target Date 07/30/21   ? ?  ?  ? ?  ? ? ? ? PT Long Term Goals - 07/09/21 1309   ? ?  ? PT LONG TERM GOAL #1  ? Title Pt will have a decrease in low back pain to 4/10 or less at worst to promote ability to ambulate longer distances as well as perform standing tasks more comfortably.   ? Baseline 9/10 low back pain at most for the past 3 months. Pt also demonstrates B L5 radiating symptoms to knees (07/09/2021)   ? Time 8   ? Period Weeks   ? Status New   ? Target Date 09/03/21   ?  ? PT LONG TERM GOAL #2  ? Title Pt will improve B hip extension, abduction and ER strength by at least 1/2 MMT grade to promote ability to perform standing tasks with less back pain.   ? Baseline Hip extension 4+/5 R, 3+/5 L, hip abduction 4/5 R,  4-/5 L, hip ER 4-/5 R, 3+/5 L (07/09/2021)   ? Time 8   ? Period Weeks   ? Status New   ? Target Date 09/03/21   ?  ? PT LONG TERM GOAL #3  ? Title Pt will improve her lumbar FOTO score by at least 10 points as a demonstration of improved function.   ? Baseline Lumbar Spine FOTO 44 (07/09/2021)   ? Time 8   ? Period Weeks   ? Status New   ? Target Date 09/03/21   ? ?  ?  ? ?  ? ? ? ? ? ? ? ? Plan - 07/23/21 1717   ? ? Clinical Impression Statement Improved ability to activate transversus abdominis muscles observed and palpated. Continued working on improving trunk and glute strength to help decrease stress to low back. Fair tolerance to today's session. Pt will benefit from continued skilled physical therapy services to decrease pain, improve strength and function.   ? Personal Factors and Comorbidities Age;Comorbidity 3+;Fitness;Past/Current Experience;Time since onset of injury/illness/exacerbation   ? Comorbidities B knee pain, arthritis, chronic kidney disease,  COPD, dyspnea, HTN   ? Examination-Activity Limitations Squat;Lift;Stairs;Locomotion Level;Stand;Reach Overhead;Carry   ? Stability/Clinical Decision Making Stable/Uncomplicated   ? Clinical Decision Makin

## 2021-07-28 ENCOUNTER — Other Ambulatory Visit: Payer: Self-pay

## 2021-07-28 ENCOUNTER — Ambulatory Visit: Payer: Medicare Other

## 2021-07-28 DIAGNOSIS — M545 Low back pain, unspecified: Secondary | ICD-10-CM

## 2021-07-28 DIAGNOSIS — M5416 Radiculopathy, lumbar region: Secondary | ICD-10-CM

## 2021-07-28 DIAGNOSIS — M6281 Muscle weakness (generalized): Secondary | ICD-10-CM

## 2021-07-28 DIAGNOSIS — R262 Difficulty in walking, not elsewhere classified: Secondary | ICD-10-CM

## 2021-07-28 NOTE — Patient Instructions (Signed)
Access Code: [email or web address] ?URL: [email or web address] ?Date: 07/28/2021 ?Prepared by: Joneen Boers ? ?Exercises ?Seated Transversus Abdominis Bracing - 5 x daily - 7 x weekly - 3 sets - 10 reps - 5 seconds hold ?Seated Trunk Rotation - Arms Crossed - 1 x daily - 7 x weekly - 3 sets - 10 reps - 5 seconds hold ?Standing Gluteal Sets - 1 x daily - 7 x weekly - 3 sets - 10 reps - 5 seconds hold ? ?

## 2021-07-28 NOTE — Therapy (Signed)
?OUTPATIENT PHYSICAL THERAPY TREATMENT NOTE ? ? ?Patient Name: Julia Deleon ?MRN: [id number] ?DOB:July 30, 1945, 76 y.o., female ?Today's Date: 07/28/2021 ? ?PCP: Charlynne Cousins, MD ?REFERRING PROVIDER: Diamond Nickel, DO ? ? PT End of Session - 07/28/21 1422   ? ? Visit Number 6   ? Number of Visits 17   ? Date for PT Re-Evaluation 09/03/21   ? Authorization Type 6   ? Authorization Time Period of 10 progress report   ? PT Start Time 0258   ? PT Stop Time 1502   ? PT Time Calculation (min) 40 min   ? Activity Tolerance Patient tolerated treatment well   ? Behavior During Therapy [facility] for tasks assessed/performed   ? ?  ?  ? ?  ? ? ?Past Medical History:  ?Diagnosis Date  ? Arthritis   ? Chronic kidney disease   ? COPD (chronic obstructive pulmonary disease) ([place])   ? pt denies   ? Dyspnea   ? Family history of adverse reaction to anesthesia   ? father was slow to wake up  ? Fuch's endothelial dystrophy   ? GERD (gastroesophageal reflux disease)   ? Hyperlipidemia   ? Hypertension   ? Hypothyroidism   ? Pneumonia   ? hx of   ? PONV (postoperative nausea and vomiting)   ? slow to wake up and PONV  ? ?Past Surgical History:  ?Procedure Laterality Date  ? ABDOMINAL HYSTERECTOMY    ? APPENDECTOMY    ? BACK SURGERY  09/26/2018  ? L4-5 PLIF by Dr. Arnoldo Morale  ? CARDIAC CATHETERIZATION    ? CHOLECYSTECTOMY    ? COLONOSCOPY WITH PROPOFOL N/A 10/23/2020  ? Procedure: COLONOSCOPY WITH PROPOFOL;  Surgeon: Virgel Manifold, MD;  Location: [facility] ENDOSCOPY;  Service: Endoscopy;  Laterality: N/A;  ? ESOPHAGOGASTRODUODENOSCOPY N/A 10/23/2020  ? Procedure: ESOPHAGOGASTRODUODENOSCOPY (EGD);  Surgeon: Virgel Manifold, MD;  Location: [facility] ENDOSCOPY;  Service: Endoscopy;  Laterality: N/A;  ? EYE SURGERY    ? FOOT SURGERY Right   ? pin removed left  ? laser vein surgery    ? NASAL SINUS SURGERY    ? RIGHT/LEFT HEART CATH AND CORONARY ANGIOGRAPHY N/A 12/06/2017  ? Procedure: RIGHT/LEFT HEART CATH AND CORONARY ANGIOGRAPHY;  Surgeon: Yolonda Kida, MD;  Location: [place] [facility];  Service: Cardiovascular;  Laterality: N/A;  ? ROBOTIC ASSISTED LAPAROSCOPIC SACROCOLPOPEXY Bilateral 07/30/2020  ? Procedure: XI ROBOTIC ASSISTED LAPAROSCOPIC SACROCOLPOPEXY AND SUPRACERVICAL HYSTERECTOMY WITH BILATERAL SALPINGO OOPHERECTOMY;  Surgeon: Ardis Hughs, MD;  Location: [facility];  Service: Urology;  Laterality: Bilateral;  REQUESTING 4 HRS  ? TONSILLECTOMY    ? ?Patient Active Problem List  ? Diagnosis Date Noted  ? Essential hypertension 01/23/2021  ? Cystocele with prolapse 07/30/2020  ? Postsurgical arthrodesis status 07/25/2020  ? Facet arthropathy 04/08/2020  ? Family history of breast cancer 11/16/2019  ? Body mass index (BMI) 25.0-25.9, adult 07/24/2019  ? Chronic UTI 12/06/2018  ? Status post lumbar surgery 12/06/2018  ? Spondylolisthesis, lumbar region 09/26/2018  ? Aortic atherosclerosis ([place]) 08/31/2018  ? Food sensitivity headache 03/29/2018  ? CAD (coronary artery disease) 02/23/2018  ? Advanced care planning/counseling discussion 03/28/2017  ? Varicose veins of both lower extremities with complications 52/77/8242  ? Arthritis 12/09/2016  ? Vaginal atrophy 09/10/2015  ? Vasomotor symptoms due to menopause 09/10/2015  ? Onychomycosis due to dermatophyte 04/07/2015  ? Bursitis of right shoulder 02/26/2015  ? Environmental and seasonal allergies 02/26/2015  ? Other allergic rhinitis 02/26/2015  ?  Osteoporosis 11/26/2014  ? COPD (chronic obstructive pulmonary disease) ([place]) 11/26/2014  ? Fuchs' corneal dystrophy 11/26/2014  ? Benign hypertensive renal disease 11/26/2014  ? Hyperlipidemia 11/26/2014  ? Chronic kidney disease, stage III (moderate) ([place]) 11/26/2014  ? Hypothyroidism 11/26/2014  ? Endothelial corneal dystrophy 11/26/2014  ? ? ?REFERRING DIAG: Chronic L low back pain ? ?THERAPY DIAG:  ?Chronic bilateral low back pain, unspecified whether sciatica present ? ?Muscle weakness (generalized) ? ?Difficulty in walking, not elsewhere  classified ? ?Radiculopathy, lumbar region ? ?PERTINENT HISTORY: Chronic low back pain. Had L4/5 fusion in her low back May 2020 which helped her sleep. Had PT following surgery which helped. Bowel and bladder are not perfect but improving. Had pelvic floor therapy which did not help. The bowel issues might have started after the back surgery. Feels like her urinary tract system quit due to the pain after back surgery. Was placed on a catheter for 6 months after the surgery. Denies saddle anesthesia. Pt also states having a kidney infection May 2022 and was diagnosed at the end of [place] 2022. Took all summer to get rid of the kidney infection which wiped her out and weakned her back. ? ?PRECAUTIONS: No known precautions ? ?SUBJECTIVE: Doing ok. Starting to get tired since its after lunch. Not a lot of tighness currently. ? ?PAIN:  ?Are you having pain? No ? ? ? ? ?TODAY'S TREATMENT:  ?Objective  ?  ? ?Latex allergies ?  ?  ?07/28/2021 ? ? ?Therapeutic exercise ?  ?Standing glute max squeeze with B UE assist to promote hip extension and glute max activation 10x5 seconds ? ? ?Standing static mini lunge with contralateral UE assist  ? L 7x ? R 7x ? Knee joint discomfort. Eases with rest.  ? ?Standing with B UE assist  ? Hip abduction  ?  R 10x ?  L 10x ? ?Standing B shoulder extension red band 10x2 ?  ?Sitting with upright posture ? Manual perturbation all directions with PT, pt holding PVC pipe 20 seconds for 2 sets ? Back feels weak per pt.  ? ?Seated B shoulder extension, hands on thighs 10x5 second holds for 3 sets to promote trunk strength ? ?Seated clamshells, hips less than 90 degrees flexion red band 10x ? L L5 discomfort to knees. Eases with rest.  ? ? ? ?Improved exercise technique, movement at target joints, use of target muscles after mod verbal, visual, tactile cues.  ?  ?  ?Manual therapy ?Seated STM B lumbar thoracic paraspinal and quadratus lumborum muscles to decrease tension  ?             ?  ?  ?  ?   ?Response to treatment ?Pt tolerated session well without aggravaton of symptoms.  ?  ?  ?Clinical impression ?Continued working on improving trunk and glute strength to help decrease stress to low back and B knees. Added standing glute sets as part of her HEP to help improve hip extension and decrease low back extension compensation. Pt tolerated session well without aggravation of symptoms. Pt will benefit from continued skilled physical therapy services to decrease pain, improve strength and function.  ? ? ?PATIENT EDUCATION: ?Education details: there-ex, HEP ?Person educated: Patient ?Education method: Explanation, Demonstration, Tactile cues, Verbal cues, and Handouts ?Education comprehension: verbalized understanding and returned demonstration ? ? ?HOME EXERCISE PROGRAM: ?Access Code: [email or web address] ?URL: [email or web address] ?Date: 07/28/2021 ?Prepared by: Joneen Boers ? ?Exercises ?Seated Transversus Abdominis Bracing - 5 x daily - 7 x weekly -  3 sets - 10 reps - 5 seconds hold ?Seated Trunk Rotation - Arms Crossed - 1 x daily - 7 x weekly - 3 sets - 10 reps - 5 seconds hold ?Standing Gluteal Sets - 1 x daily - 7 x weekly - 3 sets - 10 reps - 5 seconds hold ? ? ? PT Short Term Goals   ? ?  ? PT SHORT TERM GOAL #1  ? Title Pt will be independent with her initial HEP to decrease pain, improve strength and ability to ambulate longer distances.   ? Baseline Pt has started her initial HEP (07/09/2021)   ? Time 3   ? Period Weeks   ? Status New   ? Target Date 07/30/21   ? ?  ?  ? ?  ? ? ? PT Long Term Goals  ? ?  ? PT LONG TERM GOAL #1  ? Title Pt will have a decrease in low back pain to 4/10 or less at worst to promote ability to ambulate longer distances as well as perform standing tasks more comfortably.   ? Baseline 9/10 low back pain at most for the past 3 months. Pt also demonstrates B L5 radiating symptoms to knees (07/09/2021)   ? Time 8   ? Period Weeks   ? Status New   ? Target Date 09/03/21   ?   ? PT LONG TERM GOAL #2  ? Title Pt will improve B hip extension, abduction and ER strength by at least 1/2 MMT grade to promote ability to perform standing tasks with less back pain.   ? Baseline Hip extension

## 2021-07-30 ENCOUNTER — Other Ambulatory Visit: Payer: Self-pay

## 2021-07-30 ENCOUNTER — Ambulatory Visit: Payer: Medicare Other

## 2021-07-30 DIAGNOSIS — M6281 Muscle weakness (generalized): Secondary | ICD-10-CM

## 2021-07-30 DIAGNOSIS — R262 Difficulty in walking, not elsewhere classified: Secondary | ICD-10-CM

## 2021-07-30 DIAGNOSIS — M545 Low back pain, unspecified: Secondary | ICD-10-CM | POA: Diagnosis not present

## 2021-07-30 DIAGNOSIS — G8929 Other chronic pain: Secondary | ICD-10-CM

## 2021-07-30 DIAGNOSIS — M5416 Radiculopathy, lumbar region: Secondary | ICD-10-CM

## 2021-07-30 NOTE — Therapy (Signed)
[place] [facility] ?[address] ?[place], [place], [address] ?Phone: [phone]   Fax:  [phone] ? ?Physical Therapy Treatment ? ?Patient Details  ?Name: Julia Deleon ?MRN: [id number] ?Date of Birth: 15-Dec-1945 ?Referring Provider (PT): Rosalia Hammers, DO ? ? ?Encounter Date: 07/30/2021 ? ? PT End of Session - 07/30/21 1424   ? ? Visit Number 7   ? Number of Visits 17   ? Date for PT Re-Evaluation 09/03/21   ? Authorization Type 7   ? Authorization Time Period of 10 progress report   ? PT Start Time 1418   ? PT Stop Time 1501   ? PT Time Calculation (min) 43 min   ? Activity Tolerance Patient tolerated treatment well   ? Behavior During Therapy [facility] for tasks assessed/performed   ? ?  ?  ? ?  ? ? ?Past Medical History:  ?Diagnosis Date  ? Arthritis   ? Chronic kidney disease   ? COPD (chronic obstructive pulmonary disease) ([place])   ? pt denies   ? Dyspnea   ? Family history of adverse reaction to anesthesia   ? father was slow to wake up  ? Fuch's endothelial dystrophy   ? GERD (gastroesophageal reflux disease)   ? Hyperlipidemia   ? Hypertension   ? Hypothyroidism   ? Pneumonia   ? hx of   ? PONV (postoperative nausea and vomiting)   ? slow to wake up and PONV  ? ? ?Past Surgical History:  ?Procedure Laterality Date  ? ABDOMINAL HYSTERECTOMY    ? APPENDECTOMY    ? BACK SURGERY  09/26/2018  ? L4-5 PLIF by Dr. Arnoldo Morale  ? CARDIAC CATHETERIZATION    ? CHOLECYSTECTOMY    ? COLONOSCOPY WITH PROPOFOL N/A 10/23/2020  ? Procedure: COLONOSCOPY WITH PROPOFOL;  Surgeon: Virgel Manifold, MD;  Location: [facility] ENDOSCOPY;  Service: Endoscopy;  Laterality: N/A;  ? ESOPHAGOGASTRODUODENOSCOPY N/A 10/23/2020  ? Procedure: ESOPHAGOGASTRODUODENOSCOPY (EGD);  Surgeon: Virgel Manifold, MD;  [facility];  Service: Endoscopy;  Laterality: N/A;  ? EYE SURGERY    ? FOOT SURGERY Right   ? pin removed left  ? laser vein surgery    ? NASAL SINUS SURGERY    ? RIGHT/LEFT  HEART CATH AND CORONARY ANGIOGRAPHY N/A 12/06/2017  ? Procedure: RIGHT/LEFT HEART CATH AND CORONARY ANGIOGRAPHY;  Surgeon: Yolonda Kida, MD;  Location: [place] [facility];  Service: Cardiovascular;  Laterality: N/A;  ? ROBOTIC ASSISTED LAPAROSCOPIC SACROCOLPOPEXY Bilateral 07/30/2020  ? Procedure: XI ROBOTIC ASSISTED LAPAROSCOPIC SACROCOLPOPEXY AND SUPRACERVICAL HYSTERECTOMY WITH BILATERAL SALPINGO OOPHERECTOMY;  Surgeon: Ardis Hughs, MD;  Location: [facility];  Service: Urology;  Laterality: Bilateral;  REQUESTING 4 HRS  ? TONSILLECTOMY    ? ? ?There were no vitals filed for this visit. ? ? Subjective Assessment - 07/30/21 1422   ? ? Subjective Pt reports 3/10 NPS in low back. Feeling "fair". Did receive "gel shot" in L knee prior to arrival.   ? Pertinent History Chronic low back pain. Had L4/5 fusion in her low back May 2020 which helped her sleep. Had PT following surgery which helped. Bowel and bladder are not perfect but improving. Had pelvic floor therapy which did not help. The bowel issues might have started after the back surgery. Feels like her urinary tract system quit due to the pain after back surgery. Was placed on a catheter for 6 months after the surgery. Denies saddle anesthesia. Pt also states  having a kidney infection May 2022 and was diagnosed at the end of [place] 2022. Took all summer to get rid of the kidney infection which wiped her out and weakned her back.   ? Patient Stated Goals Not use her back brace. Want to walk further.   ? Currently in Pain? Yes   ? Pain Score 3    ? Pain Location Back   ? Pain Orientation Right;Left;Posterior;Lower   ? Pain Onset More than a month ago   ? ?  ?  ? ?  ? ?There.ex:  ? Nu-Step L2 for 5 min for gentle mobility.  ? ? Education and performance of nerve flossing for pt reports of radicular pain: x15/LE. With PT demo prior to performance. Intermittent VC's for sequencing of LE's and head.  ? ? Sitting with upright posture ?             Manual  perturbation all directions with PT: 3x15 sec  ? ? Hook lying knees to chest for lumbar flexion and pain relief: 2x15. Min VC's for minor adjustments to form/technique.  ? ?  Hook lying post pelvic tilts: 2x15, mod multimodal cues for form/technique ?  ? Hook lying: Red TB clam shell: 2x15, RTB ? ? Hook lying glut squeezes: 1x15 with 5 sec holds ?  ? ? PT Short Term Goals - 07/09/21 1305   ? ?  ? PT SHORT TERM GOAL #1  ? Title Pt will be independent with her initial HEP to decrease pain, improve strength and ability to ambulate longer distances.   ? Baseline Pt has started her initial HEP (07/09/2021)   ? Time 3   ? Period Weeks   ? Status New   ? Target Date 07/30/21   ? ?  ?  ? ?  ? ? ? ? PT Long Term Goals - 07/09/21 1309   ? ?  ? PT LONG TERM GOAL #1  ? Title Pt will have a decrease in low back pain to 4/10 or less at worst to promote ability to ambulate longer distances as well as perform standing tasks more comfortably.   ? Baseline 9/10 low back pain at most for the past 3 months. Pt also demonstrates B L5 radiating symptoms to knees (07/09/2021)   ? Time 8   ? Period Weeks   ? Status New   ? Target Date 09/03/21   ?  ? PT LONG TERM GOAL #2  ? Title Pt will improve B hip extension, abduction and ER strength by at least 1/2 MMT grade to promote ability to perform standing tasks with less back pain.   ? Baseline Hip extension 4+/5 R, 3+/5 L, hip abduction 4/5 R,  4-/5 L, hip ER 4-/5 R, 3+/5 L (07/09/2021)   ? Time 8   ? Period Weeks   ? Status New   ? Target Date 09/03/21   ?  ? PT LONG TERM GOAL #3  ? Title Pt will improve her lumbar FOTO score by at least 10 points as a demonstration of improved function.   ? Baseline Lumbar Spine FOTO 44 (07/09/2021)   ? Time 8   ? Period Weeks   ? Status New   ? Target Date 09/03/21   ? ?  ?  ? ?  ? ? ? ? ? ? ? ? Plan - 07/30/21 1449   ? ? Clinical Impression Statement Education provided on nerve flossing techniques and lumbar flexion techniques for pain relief for  radicular  symptoms. Pt demoing good understanding. Continuing PT POC with focus on core and hip strengthening remainder of session to support low back and inmprove pain with ADL completion. Pt will continue to benefit from skilled PT services to progress mobility and strength while reducing pain levels.   ? Personal Factors and Comorbidities Age;Comorbidity 3+;Fitness;Past/Current Experience;Time since onset of injury/illness/exacerbation   ? Comorbidities B knee pain, arthritis, chronic kidney disease, COPD, dyspnea, HTN   ? Examination-Activity Limitations Squat;Lift;Stairs;Locomotion Level;Stand;Reach Overhead;Carry   ? Stability/Clinical Decision Making Stable/Uncomplicated   ? Rehab Potential Fair   ? PT Frequency 2x / week   ? PT Duration 8 weeks   ? PT Treatment/Interventions Therapeutic activities;Therapeutic exercise;Balance training;Neuromuscular re-education;Patient/family education;Manual techniques;Dry needling;Aquatic Therapy;Electrical Stimulation;Iontophoresis '4mg'$ /ml Dexamethasone   ? PT Next Visit Plan Posture, trunk and hip strengthening, manual techniques, modalities PRN   ? PT [place] Access Code [email or web address]   ? Consulted and Agree with Plan of Care Patient   ? ?  ?  ? ?  ? ? ?Patient will benefit from skilled therapeutic intervention in order to improve the following deficits and impairments:  Pain, Improper body mechanics, Postural dysfunction, Difficulty walking, Decreased strength, Abnormal gait, Decreased activity tolerance ? ?Visit Diagnosis: ?Chronic bilateral low back pain, unspecified whether sciatica present ? ?Muscle weakness (generalized) ? ?Difficulty in walking, not elsewhere classified ? ?Radiculopathy, lumbar region ? ? ? ? ?Problem List ?Patient Active Problem List  ? Diagnosis Date Noted  ? Essential hypertension 01/23/2021  ? Cystocele with prolapse 07/30/2020  ? Postsurgical arthrodesis status 07/25/2020  ? Facet arthropathy 04/08/2020  ? Family history of breast  cancer 11/16/2019  ? Body mass index (BMI) 25.0-25.9, adult 07/24/2019  ? Chronic UTI 12/06/2018  ? Status post lumbar surgery 12/06/2018  ? Spondylolisthesis, lumbar region 09/26/2018  ? Aortic atherosclerosis ([facility]) 0

## 2021-08-04 ENCOUNTER — Ambulatory Visit: Payer: Medicare Other

## 2021-08-04 ENCOUNTER — Other Ambulatory Visit: Payer: Self-pay

## 2021-08-04 DIAGNOSIS — M545 Low back pain, unspecified: Secondary | ICD-10-CM

## 2021-08-04 DIAGNOSIS — R262 Difficulty in walking, not elsewhere classified: Secondary | ICD-10-CM

## 2021-08-04 DIAGNOSIS — M5416 Radiculopathy, lumbar region: Secondary | ICD-10-CM

## 2021-08-04 DIAGNOSIS — M6281 Muscle weakness (generalized): Secondary | ICD-10-CM

## 2021-08-04 NOTE — Therapy (Signed)
?OUTPATIENT PHYSICAL THERAPY TREATMENT NOTE ? ? ?Patient Name: Julia Deleon ?MRN: [id number] ?DOB:Mar 19, 1946, 76 y.o., female ?Today's Date: 08/04/2021 ? ?PCP: Charlynne Cousins, MD ?REFERRING PROVIDER: Diamond Nickel, DO ? ? PT End of Session - 08/04/21 1021   ? ? Visit Number 8   ? Number of Visits 17   ? Date for PT Re-Evaluation 09/03/21   ? Authorization Type 8   ? Authorization Time Period of 10 progress report   ? PT Start Time 1022   ? PT Stop Time 3825   ? PT Time Calculation (min) 47 min   ? Activity Tolerance Patient tolerated treatment well   ? Behavior During Therapy [facility] for tasks assessed/performed   ? ?  ?  ? ?  ? ? ? ?Past Medical History:  ?Diagnosis Date  ? Arthritis   ? Chronic kidney disease   ? COPD (chronic obstructive pulmonary disease) ([place])   ? pt denies   ? Dyspnea   ? Family history of adverse reaction to anesthesia   ? father was slow to wake up  ? Fuch's endothelial dystrophy   ? GERD (gastroesophageal reflux disease)   ? Hyperlipidemia   ? Hypertension   ? Hypothyroidism   ? Pneumonia   ? hx of   ? PONV (postoperative nausea and vomiting)   ? slow to wake up and PONV  ? ?Past Surgical History:  ?Procedure Laterality Date  ? ABDOMINAL HYSTERECTOMY    ? APPENDECTOMY    ? BACK SURGERY  09/26/2018  ? L4-5 PLIF by Dr. Arnoldo Morale  ? CARDIAC CATHETERIZATION    ? CHOLECYSTECTOMY    ? COLONOSCOPY WITH PROPOFOL N/A 10/23/2020  ? Procedure: COLONOSCOPY WITH PROPOFOL;  Surgeon: Virgel Manifold, MD;  Location: [facility] ENDOSCOPY;  Service: Endoscopy;  Laterality: N/A;  ? ESOPHAGOGASTRODUODENOSCOPY N/A 10/23/2020  ? Procedure: ESOPHAGOGASTRODUODENOSCOPY (EGD);  Surgeon: Virgel Manifold, MD;  Location: [facility] ENDOSCOPY;  Service: Endoscopy;  Laterality: N/A;  ? EYE SURGERY    ? FOOT SURGERY Right   ? pin removed left  ? laser vein surgery    ? NASAL SINUS SURGERY    ? RIGHT/LEFT HEART CATH AND CORONARY ANGIOGRAPHY N/A 12/06/2017  ? Procedure: RIGHT/LEFT HEART CATH AND CORONARY ANGIOGRAPHY;  Surgeon:  Yolonda Kida, MD;  Location: [place] CV LAB;  Service: Cardiovascular;  Laterality: N/A;  ? ROBOTIC ASSISTED LAPAROSCOPIC SACROCOLPOPEXY Bilateral 07/30/2020  ? Procedure: XI ROBOTIC ASSISTED LAPAROSCOPIC SACROCOLPOPEXY AND SUPRACERVICAL HYSTERECTOMY WITH BILATERAL SALPINGO OOPHERECTOMY;  Surgeon: Ardis Hughs, MD;  Location: [facility] ORS;  Service: Urology;  Laterality: Bilateral;  REQUESTING 4 HRS  ? TONSILLECTOMY    ? ?Patient Active Problem List  ? Diagnosis Date Noted  ? Essential hypertension 01/23/2021  ? Cystocele with prolapse 07/30/2020  ? Postsurgical arthrodesis status 07/25/2020  ? Facet arthropathy 04/08/2020  ? Family history of breast cancer 11/16/2019  ? Body mass index (BMI) 25.0-25.9, adult 07/24/2019  ? Chronic UTI 12/06/2018  ? Status post lumbar surgery 12/06/2018  ? Spondylolisthesis, lumbar region 09/26/2018  ? Aortic atherosclerosis ([place]) 08/31/2018  ? Food sensitivity headache 03/29/2018  ? CAD (coronary artery disease) 02/23/2018  ? Advanced care planning/counseling discussion 03/28/2017  ? Varicose veins of both lower extremities with complications 05/39/7673  ? Arthritis 12/09/2016  ? Vaginal atrophy 09/10/2015  ? Vasomotor symptoms due to menopause 09/10/2015  ? Onychomycosis due to dermatophyte 04/07/2015  ? Bursitis of right shoulder 02/26/2015  ? Environmental and seasonal allergies 02/26/2015  ? Other allergic rhinitis  02/26/2015  ? Osteoporosis 11/26/2014  ? COPD (chronic obstructive pulmonary disease) ([place]) 11/26/2014  ? Fuchs' corneal dystrophy 11/26/2014  ? Benign hypertensive renal disease 11/26/2014  ? Hyperlipidemia 11/26/2014  ? Chronic kidney disease, stage III (moderate) ([place]) 11/26/2014  ? Hypothyroidism 11/26/2014  ? Endothelial corneal dystrophy 11/26/2014  ? ? ?REFERRING DIAG: Chronic L low back pain ? ?THERAPY DIAG:  ?Chronic bilateral low back pain, unspecified whether sciatica present ? ?Muscle weakness (generalized) ? ?Difficulty in walking, not  elsewhere classified ? ?Radiculopathy, lumbar region ? ?PERTINENT HISTORY: Chronic low back pain. Had L4/5 fusion in her low back May 2020 which helped her sleep. Had PT following surgery which helped. Bowel and bladder are not perfect but improving. Had pelvic floor therapy which did not help. The bowel issues might have started after the back surgery. Feels like her urinary tract system quit due to the pain after back surgery. Was placed on a catheter for 6 months after the surgery. Denies saddle anesthesia. Pt also states having a kidney infection May 2022 and was diagnosed at the end of [place] 2022. Took all summer to get rid of the kidney infection which wiped her out and weakned her back. ? ?PRECAUTIONS: No known precautions ? ?SUBJECTIVE:Doing like it was. Just the normal pain/tightness. By the end of the day its low back pain.  ? ?PAIN:  ?Are you having pain? 3/10 tightness ? ? ? ? ?TODAY'S TREATMENT:  ?Objective  ?  ? ?Latex allergies ?  ?  ?08/04/2021 ? ? ?Therapeutic exercise ?   ?Seated R LE neural flossing 10x3 ? Increased R L5 dermatome tighness  ? ?Seated L trunk side bend 10x5 seconds to decrease R low back pressure  ? Increased R L5 dermatome tightness ? ?Seated R trunk side bend 10x5 seconds . Decreased R L5 dermatome tightness with centralization (from R leg centralizing to R thigh proximal to knee) , increased L L5 dermatome tightness ? ?Seated B scapular retraction red band 10x5 seconds, for 2 sets to promote gentle trunk extension.  ? ?Seated manually resisted gentle trunk extension isometrics in neutral 10x5 seconds for 3 sets  ? No L LE tightness afterwards.  ? ? ?Standing heel toe raises with B UE assist 10x2 ? ?Standing hip abductoin with B UE assist  ? R 10x2. Decreased R lateral thigh tightness, increased R posterior thigh tightness.  ? L 10x2. L lateral thigh tightness when cued to improve L hip IR to promote neutral foot posture  ? ?Standing with B UE assist glute max squeeze 10x2 with 5  seconds  ? Decreased B LE tightness ? ? ? ? ?  ?Response to treatment ?Pt tolerated session well without aggravaton of symptoms.  ?  ?  ?Clinical impression ?Demonstrates signs and symptoms of disc related issue as well as stenosis related issue affecting R and L L5 dermatome simultaneously. Decreased B L5 dermatome and R sciatic nerve tightness with gentle lumbar extension (standing position) and glute max muscle activation (to help increase intervertebral foraminal space in standing).  Pt tolerated session well without aggravation of symptoms. Pt will benefit from continued skilled physical therapy services to decrease pain, improve strength and function.  ? ? ? ? ?PATIENT EDUCATION: ?Education details: there-ex, HEP ?Person educated: Patient ?Education method: Explanation, Demonstration, Tactile cues, Verbal cues, and Handouts ?Education comprehension: verbalized understanding and returned demonstration ? ? ?HOME EXERCISE PROGRAM: ?Access Code: [email or web address] ?URL: [email or web address] ?Date: 08/04/2021 ?Prepared by: Joneen Boers ? ?Exercises ?- Seated Transversus Abdominis Bracing  -  5 x daily - 7 x weekly - 3 sets - 10 reps - 5 seconds hold ?- Seated Trunk Rotation - Arms Crossed  - 1 x daily - 7 x weekly - 3 sets - 10 reps - 5 seconds  hold ?- Standing Gluteal Sets  - 1 x daily - 7 x weekly - 3 sets - 10 reps - 5 seconds hold ?- Supine Double Knee to Chest  - 1 x daily - 7 x weekly - 2 sets - 15 reps ?- Seated Sciatic Tensioner  - 1 x daily - 7 x weekly - 2 sets - 15 reps ? ? ? PT Short Term Goals   ? ?  ? PT SHORT TERM GOAL #1  ? Title Pt will be independent with her initial HEP to decrease pain, improve strength and ability to ambulate longer distances.   ? Baseline Pt has started her initial HEP (07/09/2021)   ? Time 3   ? Period Weeks   ? Status New   ? Target Date 07/30/21   ? ?  ?  ? ?  ? ? ? PT Long Term Goals  ? ?  ? PT LONG TERM GOAL #1  ? Title Pt will have a decrease in low back pain to 4/10  or less at worst to promote ability to ambulate longer distances as well as perform standing tasks more comfortably.   ? Baseline 9/10 low back pain at most for the past 3 months. Pt also demonstrates B L5 radiating

## 2021-08-06 ENCOUNTER — Ambulatory Visit: Payer: Medicare Other

## 2021-08-06 DIAGNOSIS — R262 Difficulty in walking, not elsewhere classified: Secondary | ICD-10-CM

## 2021-08-06 DIAGNOSIS — M5416 Radiculopathy, lumbar region: Secondary | ICD-10-CM

## 2021-08-06 DIAGNOSIS — M6281 Muscle weakness (generalized): Secondary | ICD-10-CM

## 2021-08-06 DIAGNOSIS — M545 Low back pain, unspecified: Secondary | ICD-10-CM | POA: Diagnosis not present

## 2021-08-06 DIAGNOSIS — G8929 Other chronic pain: Secondary | ICD-10-CM

## 2021-08-06 NOTE — Therapy (Signed)
?OUTPATIENT PHYSICAL THERAPY TREATMENT NOTE ? ? ?Patient Name: Julia Deleon ?MRN: [id number] ?DOB:1945-07-16, 76 y.o., female ?Today's Date: 08/06/2021 ? ?PCP: Charlynne Cousins, MD ?REFERRING PROVIDER: Diamond Nickel, DO ? ? PT End of Session - 08/06/21 1421   ? ? Visit Number 9   ? Number of Visits 17   ? Date for PT Re-Evaluation 09/03/21   ? Authorization Type 9   ? Authorization Time Period of 10 progress report   ? PT Start Time 0086   ? PT Stop Time 1502   ? PT Time Calculation (min) 40 min   ? Activity Tolerance Patient tolerated treatment well   ? Behavior During Therapy [facility] for tasks assessed/performed   ? ?  ?  ? ?  ? ? ?Past Medical History:  ?Diagnosis Date  ? Arthritis   ? Chronic kidney disease   ? COPD (chronic obstructive pulmonary disease) ([place])   ? pt denies   ? Dyspnea   ? Family history of adverse reaction to anesthesia   ? father was slow to wake up  ? Fuch's endothelial dystrophy   ? GERD (gastroesophageal reflux disease)   ? Hyperlipidemia   ? Hypertension   ? Hypothyroidism   ? Pneumonia   ? hx of   ? PONV (postoperative nausea and vomiting)   ? slow to wake up and PONV  ? ?Past Surgical History:  ?Procedure Laterality Date  ? ABDOMINAL HYSTERECTOMY    ? APPENDECTOMY    ? BACK SURGERY  09/26/2018  ? L4-5 PLIF by Dr. Arnoldo Morale  ? CARDIAC CATHETERIZATION    ? CHOLECYSTECTOMY    ? COLONOSCOPY WITH PROPOFOL N/A 10/23/2020  ? Procedure: COLONOSCOPY WITH PROPOFOL;  Surgeon: Virgel Manifold, MD;  Location: [facility] ENDOSCOPY;  Service: Endoscopy;  Laterality: N/A;  ? ESOPHAGOGASTRODUODENOSCOPY N/A 10/23/2020  ? Procedure: ESOPHAGOGASTRODUODENOSCOPY (EGD);  Surgeon: Virgel Manifold, MD;  Location: [facility] ENDOSCOPY;  Service: Endoscopy;  Laterality: N/A;  ? EYE SURGERY    ? FOOT SURGERY Right   ? pin removed left  ? laser vein surgery    ? NASAL SINUS SURGERY    ? RIGHT/LEFT HEART CATH AND CORONARY ANGIOGRAPHY N/A 12/06/2017  ? Procedure: RIGHT/LEFT HEART CATH AND CORONARY ANGIOGRAPHY;  Surgeon: Yolonda Kida, MD;  Location: [place] CV LAB;  Service: Cardiovascular;  Laterality: N/A;  ? ROBOTIC ASSISTED LAPAROSCOPIC SACROCOLPOPEXY Bilateral 07/30/2020  ? Procedure: XI ROBOTIC ASSISTED LAPAROSCOPIC SACROCOLPOPEXY AND SUPRACERVICAL HYSTERECTOMY WITH BILATERAL SALPINGO OOPHERECTOMY;  Surgeon: Ardis Hughs, MD;  Location: [facility] ORS;  Service: Urology;  Laterality: Bilateral;  REQUESTING 4 HRS  ? TONSILLECTOMY    ? ?Patient Active Problem List  ? Diagnosis Date Noted  ? Essential hypertension 01/23/2021  ? Cystocele with prolapse 07/30/2020  ? Postsurgical arthrodesis status 07/25/2020  ? Facet arthropathy 04/08/2020  ? Family history of breast cancer 11/16/2019  ? Body mass index (BMI) 25.0-25.9, adult 07/24/2019  ? Chronic UTI 12/06/2018  ? Status post lumbar surgery 12/06/2018  ? Spondylolisthesis, lumbar region 09/26/2018  ? Aortic atherosclerosis ([place]) 08/31/2018  ? Food sensitivity headache 03/29/2018  ? CAD (coronary artery disease) 02/23/2018  ? Advanced care planning/counseling discussion 03/28/2017  ? Varicose veins of both lower extremities with complications 76/19/5093  ? Arthritis 12/09/2016  ? Vaginal atrophy 09/10/2015  ? Vasomotor symptoms due to menopause 09/10/2015  ? Onychomycosis due to dermatophyte 04/07/2015  ? Bursitis of right shoulder 02/26/2015  ? Environmental and seasonal allergies 02/26/2015  ? Other allergic rhinitis 02/26/2015  ?  Osteoporosis 11/26/2014  ? COPD (chronic obstructive pulmonary disease) ([place]) 11/26/2014  ? Fuchs' corneal dystrophy 11/26/2014  ? Benign hypertensive renal disease 11/26/2014  ? Hyperlipidemia 11/26/2014  ? Chronic kidney disease, stage III (moderate) ([place]) 11/26/2014  ? Hypothyroidism 11/26/2014  ? Endothelial corneal dystrophy 11/26/2014  ? ? ? ?REFERRING DIAG: Chronic L low back pain ? ?THERAPY DIAG:  ?Chronic bilateral low back pain, unspecified whether sciatica present ? ?Muscle weakness (generalized) ? ?Difficulty in walking, not elsewhere  classified ? ?Radiculopathy, lumbar region ? ?PERTINENT HISTORY: Chronic low back pain. Had L4/5 fusion in her low back May 2020 which helped her sleep. Had PT following surgery which helped. Bowel and bladder are not perfect but improving. Had pelvic floor therapy which did not help. The bowel issues might have started after the back surgery. Feels like her urinary tract system quit due to the pain after back surgery. Was placed on a catheter for 6 months after the surgery. Denies saddle anesthesia. Pt also states having a kidney infection May 2022 and was diagnosed at the end of [place] 2022. Took all summer to get rid of the kidney infection which wiped her out and weakned her back. ? ?PRECAUTIONS: No known precautions ? ?SUBJECTIVE: Has not seen significant improvement. Slow.  ? ? ? ?PAIN:  ?Are you having pain? 3-4/10 low back discomfort. 1/10 R lateral thigh tightness ? ? ? ? ?TODAY'S TREATMENT:  ?Objective  ?  ? ?Latex allergies ?  ? ? ?Therapeutic exercise ?   ?Standing with B UE assist glute max squeeze 10x2 with 10 seconds  ?  ?Bent over hip extension over table to promote glute max muscle activation ? R 5x3  ? L 5x3 ? ?Reclined posterior pelvic tilt (for L1-3 retrolithesis and L5/S1 anterolisthesis) 10x5 seconds  ? ?Reclined position ? Hooklying  ?  B shoulder extension yellow band 10x3 with 5 second holds to promote trunk muscle strength ? ?  Hip extension isometrics, leg straight to promote glute max muscle strengthening ?   R 10x5 seconds for 3 sets ?   L 10x5 seconds for 3 sets ? ? ?Improved exercise technique, movement at target joints, use of target muscles after mod verbal, visual, tactile cues.  ? ? ? ? ?  ?Response to treatment ?Pt tolerated session well without aggravaton of symptoms.  ?  ?  ?Clinical impression ?Worked on improving glute max and trunk strength and decrease backward lean pressure to low back. Weak core and glute muscles observed during exercises. Cues needed to improve glute max  and abdominal muscle activation. Pt tolerated session well without aggravation of symptoms. Pt will benefit from continued skilled physical therapy services to decrease pain, improve strength and function.  ?   ? ? ? ?PATIENT EDUCATION: ?Education details: there-ex, HEP ?Person educated: Patient ?Education method: Explanation, Demonstration, Tactile cues, Verbal cues, and Handouts ?Education comprehension: verbalized understanding and returned demonstration ? ? ?HOME EXERCISE PROGRAM: ?Access Code: [email or web address] ?URL: [email or web address] ?Date: 08/04/2021 ?Prepared by: Joneen Boers ? ?Exercises ?- Seated Transversus Abdominis Bracing  - 5 x daily - 7 x weekly - 3 sets - 10 reps - 5 seconds hold ?- Seated Trunk Rotation - Arms Crossed  - 1 x daily - 7 x weekly - 3 sets - 10 reps - 5 seconds  hold ?- Standing Gluteal Sets  - 1 x daily - 7 x weekly - 3 sets - 10 reps - 5 seconds hold ?- Supine Double Knee to Chest  - 1 x  daily - 7 x weekly - 2 sets - 15 reps (Discontinued 08/06/2021 secondary to increased symptoms)  ?- Seated Sciatic Tensioner  - 1 x daily - 7 x weekly - 2 sets - 15 reps (Discontinued 3/303/2023 due to increased symptoms.) ?- Supine Posterior Pelvic Tilt  - 1 x daily - 7 x weekly - 3 sets - 10 reps - 5 seconds hold ? ? ? PT Short Term Goals - 07/09/21 1305   ? ?  ? PT SHORT TERM GOAL #1  ? Title Pt will be independent with her initial HEP to decrease pain, improve strength and ability to ambulate longer distances.   ? Baseline Pt has started her initial HEP (07/09/2021)   ? Time 3   ? Period Weeks   ? Status New   ? Target Date 07/30/21   ? ?  ?  ? ?  ? ? ? PT Long Term Goals - 07/09/21 1309   ? ?  ? PT LONG TERM GOAL #1  ? Title Pt will have a decrease in low back pain to 4/10 or less at worst to promote ability to ambulate longer distances as well as perform standing tasks more comfortably.   ? Baseline 9/10 low back pain at most for the past 3 months. Pt also demonstrates B L5 radiating  symptoms to knees (07/09/2021)   ? Time 8   ? Period Weeks   ? Status New   ? Target Date 09/03/21   ?  ? PT LONG TERM GOAL #2  ? Title Pt will improve B hip extension, abduction and ER strength by at least 1/2 MM

## 2021-08-11 ENCOUNTER — Ambulatory Visit: Payer: Medicare Other | Attending: Sports Medicine

## 2021-08-11 DIAGNOSIS — M545 Low back pain, unspecified: Secondary | ICD-10-CM | POA: Diagnosis present

## 2021-08-11 DIAGNOSIS — G8929 Other chronic pain: Secondary | ICD-10-CM | POA: Diagnosis present

## 2021-08-11 DIAGNOSIS — M6281 Muscle weakness (generalized): Secondary | ICD-10-CM | POA: Insufficient documentation

## 2021-08-11 DIAGNOSIS — M5416 Radiculopathy, lumbar region: Secondary | ICD-10-CM | POA: Insufficient documentation

## 2021-08-11 DIAGNOSIS — R262 Difficulty in walking, not elsewhere classified: Secondary | ICD-10-CM | POA: Diagnosis present

## 2021-08-11 DIAGNOSIS — M62838 Other muscle spasm: Secondary | ICD-10-CM | POA: Diagnosis present

## 2021-08-11 NOTE — Therapy (Signed)
?OUTPATIENT PHYSICAL THERAPY TREATMENT NOTE ?And ?Progress Report ?(07/09/2021 - 08/11/2021) ? ? ?Patient Name: Julia Deleon ?MRN: [id number] ?DOB:08/04/1945, 76 y.o., female ?Today's Date: 08/11/2021 ? ?PCP: Charlynne Cousins, MD ?REFERRING PROVIDER: Diamond Nickel, DO ? ? PT End of Session - 08/11/21 1150   ? ? Visit Number 10   ? Number of Visits 17   ? Date for PT Re-Evaluation 09/03/21   ? Authorization Type 10   ? Authorization Time Period of 10 progress report   ? PT Start Time 1150   ? PT Stop Time 1232   ? PT Time Calculation (min) 42 min   ? Activity Tolerance Patient tolerated treatment well   ? Behavior During Therapy [facility] for tasks assessed/performed   ? ?  ?  ? ?  ? ? ? ?Past Medical History:  ?Diagnosis Date  ? Arthritis   ? Chronic kidney disease   ? COPD (chronic obstructive pulmonary disease) ([place])   ? pt denies   ? Dyspnea   ? Family history of adverse reaction to anesthesia   ? father was slow to wake up  ? Fuch's endothelial dystrophy   ? GERD (gastroesophageal reflux disease)   ? Hyperlipidemia   ? Hypertension   ? Hypothyroidism   ? Pneumonia   ? hx of   ? PONV (postoperative nausea and vomiting)   ? slow to wake up and PONV  ? ?Past Surgical History:  ?Procedure Laterality Date  ? ABDOMINAL HYSTERECTOMY    ? APPENDECTOMY    ? BACK SURGERY  09/26/2018  ? L4-5 PLIF by Dr. Arnoldo Morale  ? CARDIAC CATHETERIZATION    ? CHOLECYSTECTOMY    ? COLONOSCOPY WITH PROPOFOL N/A 10/23/2020  ? Procedure: COLONOSCOPY WITH PROPOFOL;  Surgeon: Virgel Manifold, MD;  Location: [facility] ENDOSCOPY;  Service: Endoscopy;  Laterality: N/A;  ? ESOPHAGOGASTRODUODENOSCOPY N/A 10/23/2020  ? Procedure: ESOPHAGOGASTRODUODENOSCOPY (EGD);  Surgeon: Virgel Manifold, MD;  Location: [facility] ENDOSCOPY;  Service: Endoscopy;  Laterality: N/A;  ? EYE SURGERY    ? FOOT SURGERY Right   ? pin removed left  ? laser vein surgery    ? NASAL SINUS SURGERY    ? RIGHT/LEFT HEART CATH AND CORONARY ANGIOGRAPHY N/A 12/06/2017  ? Procedure: RIGHT/LEFT HEART  CATH AND CORONARY ANGIOGRAPHY;  Surgeon: Yolonda Kida, MD;  Location: [place] CV LAB;  Service: Cardiovascular;  Laterality: N/A;  ? ROBOTIC ASSISTED LAPAROSCOPIC SACROCOLPOPEXY Bilateral 07/30/2020  ? Procedure: XI ROBOTIC ASSISTED LAPAROSCOPIC SACROCOLPOPEXY AND SUPRACERVICAL HYSTERECTOMY WITH BILATERAL SALPINGO OOPHERECTOMY;  Surgeon: Ardis Hughs, MD;  Location: [facility] ORS;  Service: Urology;  Laterality: Bilateral;  REQUESTING 4 HRS  ? TONSILLECTOMY    ? ?Patient Active Problem List  ? Diagnosis Date Noted  ? Essential hypertension 01/23/2021  ? Cystocele with prolapse 07/30/2020  ? Postsurgical arthrodesis status 07/25/2020  ? Facet arthropathy 04/08/2020  ? Family history of breast cancer 11/16/2019  ? Body mass index (BMI) 25.0-25.9, adult 07/24/2019  ? Chronic UTI 12/06/2018  ? Status post lumbar surgery 12/06/2018  ? Spondylolisthesis, lumbar region 09/26/2018  ? Aortic atherosclerosis ([place]) 08/31/2018  ? Food sensitivity headache 03/29/2018  ? CAD (coronary artery disease) 02/23/2018  ? Advanced care planning/counseling discussion 03/28/2017  ? Varicose veins of both lower extremities with complications 98/92/1194  ? Arthritis 12/09/2016  ? Vaginal atrophy 09/10/2015  ? Vasomotor symptoms due to menopause 09/10/2015  ? Onychomycosis due to dermatophyte 04/07/2015  ? Bursitis of right shoulder 02/26/2015  ? Environmental and seasonal allergies  02/26/2015  ? Other allergic rhinitis 02/26/2015  ? Osteoporosis 11/26/2014  ? COPD (chronic obstructive pulmonary disease) ([place]) 11/26/2014  ? Fuchs' corneal dystrophy 11/26/2014  ? Benign hypertensive renal disease 11/26/2014  ? Hyperlipidemia 11/26/2014  ? Chronic kidney disease, stage III (moderate) ([place]) 11/26/2014  ? Hypothyroidism 11/26/2014  ? Endothelial corneal dystrophy 11/26/2014  ? ? ? ?REFERRING DIAG: Chronic L low back pain ? ?THERAPY DIAG:  ?Chronic bilateral low back pain, unspecified whether sciatica present ? ?Muscle weakness  (generalized) ? ?Difficulty in walking, not elsewhere classified ? ?Radiculopathy, lumbar region ? ?PERTINENT HISTORY: Chronic low back pain. Had L4/5 fusion in her low back May 2020 which helped her sleep. Had PT following surgery which helped. Bowel and bladder are not perfect but improving. Had pelvic floor therapy which did not help. The bowel issues might have started after the back surgery. Feels like her urinary tract system quit due to the pain after back surgery. Was placed on a catheter for 6 months after the surgery. Denies saddle anesthesia. Pt also states having a kidney infection May 2022 and was diagnosed at the end of [place] 2022. Took all summer to get rid of the kidney infection which wiped her out and weakned her back.  ? ?PRECAUTIONS: No known precautions ? ?SUBJECTIVE: Back is doing, about 2-3/10 currently. A little bit of tightness R lateral thigh.  Last time she was in PT it her over a year to recover. ? ? ? ?PAIN:  ?Are you having pain? 2-3/10 low back pain ? ? ? ?TODAY'S TREATMENT:  ?Objective  ?  ? ?Latex allergies ?  ? ? ?Therapeutic exercise ?   ?Seated manually resisted hip extension, hip abduction and ER 1x each way for each LE ? ?Reviewed progress/current status with PT towards goals.  ? ? ?Bent over hip extension over table to promote glute max muscle activation ? R 5x3  ? L 5x3 ? ? ?Reclined position ? Hooklying  ?  B shoulder extension yellow band 10x3 with 5 second holds to promote trunk muscle strength ? ?  Lower trunk rotation 10x each side ? ? ?  Hip extension isometrics, leg straight to promote glute max muscle strengthening ?   R 10x5 seconds for 3 sets ?   L 10x5 seconds for 3 sets ? ?Seated manually resisted hip extension with PT to promote glute max strength ? R 5x3 ? L 5x3 ? ? ?  ?Improved exercise technique, movement at target joints, use of target muscles after mod verbal, visual, tactile cues.  ? ? ? ? ?  ?Response to treatment ?Pt tolerated session well without  aggravaton of symptoms.  ?  ?  ?Clinical impression ?Pt demonstrates slight decrease in low back pain and improved overall bilateral hip strength since initial evaluation. Continued working on improving glute and trunk strength to decrease backward lean during standing tasks and ambulation to decrease stress to her low back. Pt tolerated session well without aggravation of symptoms. Pt will benefit from continued skilled physical therapy services to decrease pain, improve strength and function.  ?   ? ? ? ?PATIENT EDUCATION: ?Education details: there-ex, HEP ?Person educated: Patient ?Education method: Explanation, Demonstration, Tactile cues, Verbal cues, and Handouts ?Education comprehension: verbalized understanding and returned demonstration ? ? ?HOME EXERCISE PROGRAM: ?Access Code: [email or web address] ?URL: [email or web address] ?Date: 08/04/2021 ?Prepared by: Joneen Boers ? ?Exercises ?- Seated Transversus Abdominis Bracing  - 5 x daily - 7 x weekly - 3 sets - 10 reps - 5 seconds  hold ?- Seated Trunk Rotation - Arms Crossed  - 1 x daily - 7 x weekly - 3 sets - 10 reps - 5 seconds  hold ?- Standing Gluteal Sets  - 1 x daily - 7 x weekly - 3 sets - 10 reps - 5 seconds hold ?- Supine Double Knee to Chest  - 1 x daily - 7 x weekly - 2 sets - 15 reps (Discontinued 08/06/2021 secondary to increased symptoms)  ?- Seated Sciatic Tensioner  - 1 x daily - 7 x weekly - 2 sets - 15 reps (Discontinued 3/303/2023 due to increased symptoms.) ?- Supine Posterior Pelvic Tilt  - 1 x daily - 7 x weekly - 3 sets - 10 reps - 5 seconds hold ? ? ? PT Short Term Goals - 08/11/21 1152   ? ?  ? PT SHORT TERM GOAL #1  ? Title Pt will be independent with her initial HEP to decrease pain, improve strength and ability to ambulate longer distances.   ? Baseline Pt has started her initial HEP (07/09/2021); Has been doing her HEP. Does not have questions (08/11/2021)   ? Time 3   ? Period Weeks   ? Status Achieved   ? Target Date 07/30/21   ? ?   ?  ? ?  ? ? ? PT Long Term Goals - 08/11/21 1153   ? ?  ? PT LONG TERM GOAL #1  ? Title Pt will have a decrease in low back pain to 4/10 or less at worst to promote ability to ambulate longer distances as well as perf

## 2021-08-13 ENCOUNTER — Ambulatory Visit: Payer: Medicare Other

## 2021-08-13 DIAGNOSIS — M6281 Muscle weakness (generalized): Secondary | ICD-10-CM

## 2021-08-13 DIAGNOSIS — M5416 Radiculopathy, lumbar region: Secondary | ICD-10-CM

## 2021-08-13 DIAGNOSIS — M545 Low back pain, unspecified: Secondary | ICD-10-CM

## 2021-08-13 DIAGNOSIS — R262 Difficulty in walking, not elsewhere classified: Secondary | ICD-10-CM

## 2021-08-13 NOTE — Therapy (Signed)
?OUTPATIENT PHYSICAL THERAPY TREATMENT NOTE ? ? ?Patient Name: Julia Deleon ?MRN: [id number] ?DOB:1946-04-12, 76 y.o., female ?Today's Date: 08/13/2021 ? ?PCP: Charlynne Cousins, MD ?REFERRING PROVIDER: Diamond Nickel, DO ? ? PT End of Session - 08/13/21 1150   ? ? Visit Number 11   ? Number of Visits 17   ? Date for PT Re-Evaluation 09/03/21   ? Authorization Type 1   ? Authorization Time Period of 10 progress report   ? PT Start Time 1150   ? PT Stop Time 1234   ? PT Time Calculation (min) 44 min   ? Activity Tolerance Patient tolerated treatment well   ? Behavior During Therapy [facility] for tasks assessed/performed   ? ?  ?  ? ?  ? ? ? ? ?Past Medical History:  ?Diagnosis Date  ? Arthritis   ? Chronic kidney disease   ? COPD (chronic obstructive pulmonary disease) ([place])   ? pt denies   ? Dyspnea   ? Family history of adverse reaction to anesthesia   ? father was slow to wake up  ? Fuch's endothelial dystrophy   ? GERD (gastroesophageal reflux disease)   ? Hyperlipidemia   ? Hypertension   ? Hypothyroidism   ? Pneumonia   ? hx of   ? PONV (postoperative nausea and vomiting)   ? slow to wake up and PONV  ? ?Past Surgical History:  ?Procedure Laterality Date  ? ABDOMINAL HYSTERECTOMY    ? APPENDECTOMY    ? BACK SURGERY  09/26/2018  ? L4-5 PLIF by Dr. Arnoldo Morale  ? CARDIAC CATHETERIZATION    ? CHOLECYSTECTOMY    ? COLONOSCOPY WITH PROPOFOL N/A 10/23/2020  ? Procedure: COLONOSCOPY WITH PROPOFOL;  Surgeon: Virgel Manifold, MD;  Location: [facility] ENDOSCOPY;  Service: Endoscopy;  Laterality: N/A;  ? ESOPHAGOGASTRODUODENOSCOPY N/A 10/23/2020  ? Procedure: ESOPHAGOGASTRODUODENOSCOPY (EGD);  Surgeon: Virgel Manifold, MD;  Location: [facility] ENDOSCOPY;  Service: Endoscopy;  Laterality: N/A;  ? EYE SURGERY    ? FOOT SURGERY Right   ? pin removed left  ? laser vein surgery    ? NASAL SINUS SURGERY    ? RIGHT/LEFT HEART CATH AND CORONARY ANGIOGRAPHY N/A 12/06/2017  ? Procedure: RIGHT/LEFT HEART CATH AND CORONARY ANGIOGRAPHY;  Surgeon:  Yolonda Kida, MD;  Location: [place] CV LAB;  Service: Cardiovascular;  Laterality: N/A;  ? ROBOTIC ASSISTED LAPAROSCOPIC SACROCOLPOPEXY Bilateral 07/30/2020  ? Procedure: XI ROBOTIC ASSISTED LAPAROSCOPIC SACROCOLPOPEXY AND SUPRACERVICAL HYSTERECTOMY WITH BILATERAL SALPINGO OOPHERECTOMY;  Surgeon: Ardis Hughs, MD;  Location: [facility];  Service: Urology;  Laterality: Bilateral;  REQUESTING 4 HRS  ? TONSILLECTOMY    ? ?Patient Active Problem List  ? Diagnosis Date Noted  ? Essential hypertension 01/23/2021  ? Cystocele with prolapse 07/30/2020  ? Postsurgical arthrodesis status 07/25/2020  ? Facet arthropathy 04/08/2020  ? Family history of breast cancer 11/16/2019  ? Body mass index (BMI) 25.0-25.9, adult 07/24/2019  ? Chronic UTI 12/06/2018  ? Status post lumbar surgery 12/06/2018  ? Spondylolisthesis, lumbar region 09/26/2018  ? Aortic atherosclerosis ([place]) 08/31/2018  ? Food sensitivity headache 03/29/2018  ? CAD (coronary artery disease) 02/23/2018  ? Advanced care planning/counseling discussion 03/28/2017  ? Varicose veins of both lower extremities with complications 24/01/7352  ? Arthritis 12/09/2016  ? Vaginal atrophy 09/10/2015  ? Vasomotor symptoms due to menopause 09/10/2015  ? Onychomycosis due to dermatophyte 04/07/2015  ? Bursitis of right shoulder 02/26/2015  ? Environmental and seasonal allergies 02/26/2015  ? Other allergic  rhinitis 02/26/2015  ? Osteoporosis 11/26/2014  ? COPD (chronic obstructive pulmonary disease) ([place]) 11/26/2014  ? Fuchs' corneal dystrophy 11/26/2014  ? Benign hypertensive renal disease 11/26/2014  ? Hyperlipidemia 11/26/2014  ? Chronic kidney disease, stage III (moderate) ([place]) 11/26/2014  ? Hypothyroidism 11/26/2014  ? Endothelial corneal dystrophy 11/26/2014  ? ? ? ?REFERRING DIAG: Chronic L low back pain ? ?THERAPY DIAG:  ?Chronic bilateral low back pain, unspecified whether sciatica present ? ?Muscle weakness (generalized) ? ?Difficulty in walking, not  elsewhere classified ? ?Radiculopathy, lumbar region ? ?PERTINENT HISTORY: Chronic low back pain. Had L4/5 fusion in her low back May 2020 which helped her sleep. Had PT following surgery which helped. Bowel and bladder are not perfect but improving. Had pelvic floor therapy which did not help. The bowel issues might have started after the back surgery. Feels like her urinary tract system quit due to the pain after back surgery. Was placed on a catheter for 6 months after the surgery. Denies saddle anesthesia. Pt also states having a kidney infection May 2022 and was diagnosed at the end of [place] 2022. Took all summer to get rid of the kidney infection which wiped her out and weakned her back.  ? ?PRECAUTIONS: No known precautions ? ?SUBJECTIVE: Back is about like normal, about 2-3/10 currently. Has L thigh tightness from sitting in the car.  ? ? ? ? ? ?PAIN:  ?Are you having pain? 2-3/10 low back pain ? ? ? ?TODAY'S TREATMENT:  ?Objective  ?  ? ?Latex allergies ?  ? ? ?Therapeutic exercise ?   ?Bent over hip extension over table to promote glute max muscle activation ? R 5x3  ? L 5x3 ? ?R hip IR slightly more limited compared to L hip  ? ?Reclined position ? Hooklying  ?   ?  Lower trunk rotation 10x3 each side ?   ?  R piriformis stretch 2 minutes x 2 ? ?B shoulder extension yellow band 10x3 with 5 second holds to promote trunk muscle strength ? ?  Hip extension isometrics, leg straight to promote glute max muscle strengthening ?   R 10x5 seconds for 3 sets ?   L 10x5 seconds for 3 sets ? ? ?Seated manually resisted hip extension with PT to promote glute max strength ? R 10x2 ? L 10x2 ? ?Seated B ankle DF/PF to promote neural mobility 10x ? ? ? ? ?  ?Improved exercise technique, movement at target joints, use of target muscles after mod verbal, visual, tactile cues.  ? ? ?Manual therapy  ?Seated STM to B lumbar paraspinal muscle to decrease tension.  ? ? ? ?  ?Response to treatment ?No back discomfort but B lateral  tightness after session.  ?  ?  ?Clinical impression ?Continued working on improving glute and trunk strength to decrease backward lean during standing tasks and ambulation to decrease stress to her low back. No back discomfort but B lateral tightness after session. Pt will benefit from continued skilled physical therapy services to decrease pain, improve strength and function.  ?   ? ? ? ?PATIENT EDUCATION: ?Education details: there-ex, HEP ?Person educated: Patient ?Education method: Explanation, Demonstration, Tactile cues, Verbal cues, and Handouts ?Education comprehension: verbalized understanding and returned demonstration ? ? ?HOME EXERCISE PROGRAM: ?Access Code: [email or web address] ?URL: [email or web address] ?Date: 08/04/2021 ?Prepared by: Joneen Boers ? ?Exercises ?- Seated Transversus Abdominis Bracing  - 5 x daily - 7 x weekly - 3 sets - 10 reps - 5 seconds hold ?- Seated  Trunk Rotation - Arms Crossed  - 1 x daily - 7 x weekly - 3 sets - 10 reps - 5 seconds  hold ?- Standing Gluteal Sets  - 1 x daily - 7 x weekly - 3 sets - 10 reps - 5 seconds hold ?- Supine Double Knee to Chest  - 1 x daily - 7 x weekly - 2 sets - 15 reps (Discontinued 08/06/2021 secondary to increased symptoms)  ?- Seated Sciatic Tensioner  - 1 x daily - 7 x weekly - 2 sets - 15 reps (Discontinued 3/303/2023 due to increased symptoms.) ?- Supine Posterior Pelvic Tilt  - 1 x daily - 7 x weekly - 3 sets - 10 reps - 5 seconds hold ? ? ? PT Short Term Goals - 08/11/21 1152   ? ?  ? PT SHORT TERM GOAL #1  ? Title Pt will be independent with her initial HEP to decrease pain, improve strength and ability to ambulate longer distances.   ? Baseline Pt has started her initial HEP (07/09/2021); Has been doing her HEP. Does not have questions (08/11/2021)   ? Time 3   ? Period Weeks   ? Status Achieved   ? Target Date 07/30/21   ? ?  ?  ? ?  ? ? ? PT Long Term Goals - 08/11/21 1153   ? ?  ? PT LONG TERM GOAL #1  ? Title Pt will have a decrease in  low back pain to 4/10 or less at worst to promote ability to ambulate longer distances as well as perform standing tasks more comfortably.   ? Baseline 9/10 low back pain at most for the past 3 months. Pt also de

## 2021-08-18 ENCOUNTER — Ambulatory Visit: Payer: Medicare Other

## 2021-08-18 DIAGNOSIS — M545 Low back pain, unspecified: Secondary | ICD-10-CM

## 2021-08-18 DIAGNOSIS — R262 Difficulty in walking, not elsewhere classified: Secondary | ICD-10-CM

## 2021-08-18 DIAGNOSIS — M6281 Muscle weakness (generalized): Secondary | ICD-10-CM

## 2021-08-18 DIAGNOSIS — M5416 Radiculopathy, lumbar region: Secondary | ICD-10-CM

## 2021-08-18 NOTE — Therapy (Signed)
?OUTPATIENT PHYSICAL THERAPY TREATMENT NOTE ? ? ?Patient Name: Julia Deleon ?MRN: [id number] ?DOB:[birth year], 76 y.o., female ?Today's Date: 08/18/2021 ? ?PCP: Charlynne Cousins, MD ?REFERRING PROVIDER: Diamond Nickel, DO ? ? PT End of Session - 08/18/21 1152   ? ? Visit Number 12   ? Number of Visits 17   ? Date for PT Re-Evaluation 09/03/21   ? Authorization Type 2   ? Authorization Time Period of 10 progress report   ? PT Start Time 1152   ? PT Stop Time 1231   ? PT Time Calculation (min) 39 min   ? Activity Tolerance Patient tolerated treatment well   ? Behavior During Therapy [facility] for tasks assessed/performed   ? ?  ?  ? ?  ? ? ? ? ? ?Past Medical History:  ?Diagnosis Date  ? Arthritis   ? Chronic kidney disease   ? COPD (chronic obstructive pulmonary disease) ([place])   ? pt denies   ? Dyspnea   ? Family history of adverse reaction to anesthesia   ? father was slow to wake up  ? Fuch's endothelial dystrophy   ? GERD (gastroesophageal reflux disease)   ? Hyperlipidemia   ? Hypertension   ? Hypothyroidism   ? Pneumonia   ? hx of   ? PONV (postoperative nausea and vomiting)   ? slow to wake up and PONV  ? ?Past Surgical History:  ?Procedure Laterality Date  ? ABDOMINAL HYSTERECTOMY    ? APPENDECTOMY    ? BACK SURGERY  09/26/2018  ? L4-5 PLIF by Dr. Arnoldo Morale  ? CARDIAC CATHETERIZATION    ? CHOLECYSTECTOMY    ? COLONOSCOPY WITH PROPOFOL N/A 10/23/2020  ? Procedure: COLONOSCOPY WITH PROPOFOL;  Surgeon: Virgel Manifold, MD;  Location: [facility] ENDOSCOPY;  Service: Endoscopy;  Laterality: N/A;  ? ESOPHAGOGASTRODUODENOSCOPY N/A 10/23/2020  ? Procedure: ESOPHAGOGASTRODUODENOSCOPY (EGD);  Surgeon: Virgel Manifold, MD;  Location: [facility] ENDOSCOPY;  Service: Endoscopy;  Laterality: N/A;  ? EYE SURGERY    ? FOOT SURGERY Right   ? pin removed left  ? laser vein surgery    ? NASAL SINUS SURGERY    ? RIGHT/LEFT HEART CATH AND CORONARY ANGIOGRAPHY N/A 12/06/2017  ? Procedure: RIGHT/LEFT HEART CATH AND CORONARY ANGIOGRAPHY;  Surgeon:  Yolonda Kida, MD;  Location: [place] [facility];  Service: Cardiovascular;  Laterality: N/A;  ? ROBOTIC ASSISTED LAPAROSCOPIC SACROCOLPOPEXY Bilateral 07/30/2020  ? Procedure: XI ROBOTIC ASSISTED LAPAROSCOPIC SACROCOLPOPEXY AND SUPRACERVICAL HYSTERECTOMY WITH BILATERAL SALPINGO OOPHERECTOMY;  Surgeon: Ardis Hughs, MD;  Location: [facility];  Service: Urology;  Laterality: Bilateral;  REQUESTING 4 HRS  ? TONSILLECTOMY    ? ?Patient Active Problem List  ? Diagnosis Date Noted  ? Essential hypertension 01/23/2021  ? Cystocele with prolapse 07/30/2020  ? Postsurgical arthrodesis status 07/25/2020  ? Facet arthropathy 04/08/2020  ? Family history of breast cancer 11/16/2019  ? Body mass index (BMI) 25.0-25.9, adult 07/24/2019  ? Chronic UTI 12/06/2018  ? Status post lumbar surgery 12/06/2018  ? Spondylolisthesis, lumbar region 09/26/2018  ? Aortic atherosclerosis ([place]) 08/31/2018  ? Food sensitivity headache 03/29/2018  ? CAD (coronary artery disease) 02/23/2018  ? Advanced care planning/counseling discussion 03/28/2017  ? Varicose veins of both lower extremities with complications 09/47/0962  ? Arthritis 12/09/2016  ? Vaginal atrophy 09/10/2015  ? Vasomotor symptoms due to menopause 09/10/2015  ? Onychomycosis due to dermatophyte 04/07/2015  ? Bursitis of right shoulder 02/26/2015  ? Environmental and seasonal allergies 02/26/2015  ? Other  allergic rhinitis 02/26/2015  ? Osteoporosis 11/26/2014  ? COPD (chronic obstructive pulmonary disease) ([place]) 11/26/2014  ? Fuchs' corneal dystrophy 11/26/2014  ? Benign hypertensive renal disease 11/26/2014  ? Hyperlipidemia 11/26/2014  ? Chronic kidney disease, stage III (moderate) ([place]) 11/26/2014  ? Hypothyroidism 11/26/2014  ? Endothelial corneal dystrophy 11/26/2014  ? ? ? ?REFERRING DIAG: Chronic L low back pain ? ?THERAPY DIAG:  ?Chronic bilateral low back pain, unspecified whether sciatica present ? ?Muscle weakness (generalized) ? ?Difficulty in walking, not  elsewhere classified ? ?Radiculopathy, lumbar region ? ?PERTINENT HISTORY: Chronic low back pain. Had L4/5 fusion in her low back May 2020 which helped her sleep. Had PT following surgery which helped. Bowel and bladder are not perfect but improving. Had pelvic floor therapy which did not help. The bowel issues might have started after the back surgery. Feels like her urinary tract system quit due to the pain after back surgery. Was placed on a catheter for 6 months after the surgery. Denies saddle anesthesia. Pt also states having a kidney infection May 2022 and was diagnosed at the end of [place] 2022. Took all summer to get rid of the kidney infection which wiped her out and weakned her back.  ? ?PRECAUTIONS: No known precautions ? ?SUBJECTIVE: Back is about like normal, no pain currently. R LE is tighter than L but better than most days.  ? ? ? ? ? ? ? ? ?PAIN:  ?Are you having pain? 0/10 low back pain ? ? ? ?TODAY'S TREATMENT:  ?Objective  ?  ? ?Latex allergies ?  ? ? ?Therapeutic exercise ?   ?Bent over hip extension over table to promote glute max muscle activation ? R 10x2 ? L 10x2 ? Possible decrease in LE tightness reported ? ? ?Reclined position ? Hooklying  ?   ?  Lower trunk rotation 10x3 each side ?   ?  R piriformis stretch 1 minute x 2 ? ?B shoulder extension yellow band 10x3 with 5 second holds to promote trunk muscle strength ? ?  Hip extension isometrics, leg straight to promote glute max muscle strengthening ?   R 10x5 seconds for 3 sets ?   L 10x5 seconds for 3 sets ? ? ?Seated manually resisted hip extension with PT to promote glute max strength ? R 10x3 ? L 10x3 ? ?Seated anterior and posterior gentle pelvic tilts 10x3 to promote movement  ? ? ?Seated B ankle DF/PF to promote neural mobility 10x3 ? ? ? ?  ?Improved exercise technique, movement at target joints, use of target muscles after mod verbal, visual, tactile cues.  ? ? ? ?  ?Response to treatment ?Pt tolerated session well without  aggravation of symptoms.  ?  ?  ?Clinical impression ?Possible decrease in B LE tightness with bent over hip extension strengthening to improve glute max activation. Continued working on improving glute and trunk strength to decrease backward lean during standing tasks and ambulation to decrease stress to her low back. Pt tolerated session well without aggravation of symptoms. Pt will benefit from continued skilled physical therapy services to decrease pain, improve strength and function.  ?   ? ? ? ?PATIENT EDUCATION: ?Education details: there-ex, HEP ?Person educated: Patient ?Education method: Explanation, Demonstration, Tactile cues, Verbal cues, and Handouts ?Education comprehension: verbalized understanding and returned demonstration ? ? ?HOME EXERCISE PROGRAM: ?Access Code: [email or web address] ?URL: [email or web address] ?Date: 08/04/2021 ?Prepared by: Joneen Boers ? ?Exercises ?- Seated Transversus Abdominis Bracing  - 5 x daily - 7 x  weekly - 3 sets - 10 reps - 5 seconds hold ?- Seated Trunk Rotation - Arms Crossed  - 1 x daily - 7 x weekly - 3 sets - 10 reps - 5 seconds  hold ?- Standing Gluteal Sets  - 1 x daily - 7 x weekly - 3 sets - 10 reps - 5 seconds hold ?- Supine Double Knee to Chest  - 1 x daily - 7 x weekly - 2 sets - 15 reps (Discontinued 08/06/2021 secondary to increased symptoms)  ?- Seated Sciatic Tensioner  - 1 x daily - 7 x weekly - 2 sets - 15 reps (Discontinued 3/303/2023 due to increased symptoms.) ?- Supine Posterior Pelvic Tilt  - 1 x daily - 7 x weekly - 3 sets - 10 reps - 5 seconds hold ? ? ? PT Short Term Goals - 08/11/21 1152   ? ?  ? PT SHORT TERM GOAL #1  ? Title Pt will be independent with her initial HEP to decrease pain, improve strength and ability to ambulate longer distances.   ? Baseline Pt has started her initial HEP (07/09/2021); Has been doing her HEP. Does not have questions (08/11/2021)   ? Time 3   ? Period Weeks   ? Status Achieved   ? Target Date 07/30/21   ? ?  ?   ? ?  ? ? ? PT Long Term Goals - 08/11/21 1153   ? ?  ? PT LONG TERM GOAL #1  ? Title Pt will have a decrease in low back pain to 4/10 or less at worst to promote ability to ambulate longer distances as well as perfor

## 2021-08-20 ENCOUNTER — Ambulatory Visit: Payer: Medicare Other

## 2021-08-20 DIAGNOSIS — M545 Low back pain, unspecified: Secondary | ICD-10-CM | POA: Diagnosis not present

## 2021-08-20 DIAGNOSIS — G8929 Other chronic pain: Secondary | ICD-10-CM

## 2021-08-20 DIAGNOSIS — M5416 Radiculopathy, lumbar region: Secondary | ICD-10-CM

## 2021-08-20 DIAGNOSIS — M6281 Muscle weakness (generalized): Secondary | ICD-10-CM

## 2021-08-20 DIAGNOSIS — R262 Difficulty in walking, not elsewhere classified: Secondary | ICD-10-CM

## 2021-08-20 NOTE — Therapy (Signed)
?OUTPATIENT PHYSICAL THERAPY TREATMENT NOTE ? ? ?Patient Name: Julia Deleon ?MRN: [id number] ?DOB:1946-02-15, 76 y.o., female ?Today's Date: 08/20/2021 ? ?PCP: Charlynne Cousins, MD ?REFERRING PROVIDER: Diamond Nickel, DO ? ? PT End of Session - 08/20/21 1139   ? ? Visit Number 13   ? Number of Visits 17   ? Date for PT Re-Evaluation 09/03/21   ? Authorization Type 3   ? Authorization Time Period of 10 progress report   ? PT Start Time 1140   ? PT Stop Time 1225   ? PT Time Calculation (min) 45 min   ? Activity Tolerance Patient tolerated treatment well   ? Behavior During Therapy [facility] for tasks assessed/performed   ? ?  ?  ? ?  ? ? ? ? ? ? ?Past Medical History:  ?Diagnosis Date  ? Arthritis   ? Chronic kidney disease   ? COPD (chronic obstructive pulmonary disease) ([place])   ? pt denies   ? Dyspnea   ? Family history of adverse reaction to anesthesia   ? father was slow to wake up  ? Fuch's endothelial dystrophy   ? GERD (gastroesophageal reflux disease)   ? Hyperlipidemia   ? Hypertension   ? Hypothyroidism   ? Pneumonia   ? hx of   ? PONV (postoperative nausea and vomiting)   ? slow to wake up and PONV  ? ?Past Surgical History:  ?Procedure Laterality Date  ? ABDOMINAL HYSTERECTOMY    ? APPENDECTOMY    ? BACK SURGERY  09/26/2018  ? L4-5 PLIF by Dr. Arnoldo Morale  ? CARDIAC CATHETERIZATION    ? CHOLECYSTECTOMY    ? COLONOSCOPY WITH PROPOFOL N/A 10/23/2020  ? Procedure: COLONOSCOPY WITH PROPOFOL;  Surgeon: Virgel Manifold, MD;  Location: [facility] ENDOSCOPY;  Service: Endoscopy;  Laterality: N/A;  ? ESOPHAGOGASTRODUODENOSCOPY N/A 10/23/2020  ? Procedure: ESOPHAGOGASTRODUODENOSCOPY (EGD);  Surgeon: Virgel Manifold, MD;  Location: [facility] ENDOSCOPY;  Service: Endoscopy;  Laterality: N/A;  ? EYE SURGERY    ? FOOT SURGERY Right   ? pin removed left  ? laser vein surgery    ? NASAL SINUS SURGERY    ? RIGHT/LEFT HEART CATH AND CORONARY ANGIOGRAPHY N/A 12/06/2017  ? Procedure: RIGHT/LEFT HEART CATH AND CORONARY ANGIOGRAPHY;  Surgeon:  Yolonda Kida, MD;  Location: [place] [facility];  Service: Cardiovascular;  Laterality: N/A;  ? ROBOTIC ASSISTED LAPAROSCOPIC SACROCOLPOPEXY Bilateral 07/30/2020  ? Procedure: XI ROBOTIC ASSISTED LAPAROSCOPIC SACROCOLPOPEXY AND SUPRACERVICAL HYSTERECTOMY WITH BILATERAL SALPINGO OOPHERECTOMY;  Surgeon: Ardis Hughs, MD;  Location: [facility];  Service: Urology;  Laterality: Bilateral;  REQUESTING 4 HRS  ? TONSILLECTOMY    ? ?Patient Active Problem List  ? Diagnosis Date Noted  ? Essential hypertension 01/23/2021  ? Cystocele with prolapse 07/30/2020  ? Postsurgical arthrodesis status 07/25/2020  ? Facet arthropathy 04/08/2020  ? Family history of breast cancer 11/16/2019  ? Body mass index (BMI) 25.0-25.9, adult 07/24/2019  ? Chronic UTI 12/06/2018  ? Status post lumbar surgery 12/06/2018  ? Spondylolisthesis, lumbar region 09/26/2018  ? Aortic atherosclerosis ([place]) 08/31/2018  ? Food sensitivity headache 03/29/2018  ? CAD (coronary artery disease) 02/23/2018  ? Advanced care planning/counseling discussion 03/28/2017  ? Varicose veins of both lower extremities with complications 37/62/8315  ? Arthritis 12/09/2016  ? Vaginal atrophy 09/10/2015  ? Vasomotor symptoms due to menopause 09/10/2015  ? Onychomycosis due to dermatophyte 04/07/2015  ? Bursitis of right shoulder 02/26/2015  ? Environmental and seasonal allergies 02/26/2015  ?  Other allergic rhinitis 02/26/2015  ? Osteoporosis 11/26/2014  ? COPD (chronic obstructive pulmonary disease) ([place]) 11/26/2014  ? Fuchs' corneal dystrophy 11/26/2014  ? Benign hypertensive renal disease 11/26/2014  ? Hyperlipidemia 11/26/2014  ? Chronic kidney disease, stage III (moderate) ([place]) 11/26/2014  ? Hypothyroidism 11/26/2014  ? Endothelial corneal dystrophy 11/26/2014  ? ? ? ?REFERRING DIAG: Chronic L low back pain ? ?THERAPY DIAG:  ?Chronic bilateral low back pain, unspecified whether sciatica present ? ?Muscle weakness (generalized) ? ?Difficulty in walking, not  elsewhere classified ? ?Radiculopathy, lumbar region ? ?PERTINENT HISTORY: Chronic low back pain. Had L4/5 fusion in her low back May 2020 which helped her sleep. Had PT following surgery which helped. Bowel and bladder are not perfect but improving. Had pelvic floor therapy which did not help. The bowel issues might have started after the back surgery. Feels like her urinary tract system quit due to the pain after back surgery. Was placed on a catheter for 6 months after the surgery. Denies saddle anesthesia. Pt also states having a kidney infection May 2022 and was diagnosed at the end of [place] 2022. Took all summer to get rid of the kidney infection which wiped her out and weakned her back.  ? ?PRECAUTIONS: No known precautions ? ?SUBJECTIVE: Back is about like normal, about a 3/10 currently. No B lateral thigh tightness currently. I think its improving. Sees Dr. Candelaria Stagers next week. Would like to get better and feels like she needs more PT.  ? ? ? ? ? ? ? ? ?PAIN:  ?Are you having pain? 0/10 low back pain ? ? ? ?TODAY'S TREATMENT:  ?Objective  ?  ? ?Latex allergies ?  ? ? ?Therapeutic exercise ?   ?Bent over hip extension over table to promote glute max muscle activation ? R 5x4 ? L 5x4 ?  ? ?Reclined position ? Hooklying  ?   ?  Lower trunk rotation 10x2 each side ?   ?  R piriformis stretch 1 minute x 2 ? ?B shoulder extension yellow band 10x3 with 5 second holds to promote trunk muscle strength ? ?  Hip extension isometrics, leg straight to promote glute max muscle strengthening ?   R 10x5 seconds for 3 sets ?   L 10x5 seconds for 3 sets ? ?Seated manually resisted hip extension with PT to promote glute max strength ? R 10x3 ? L 10x3 ? ? ?Seated anterior and posterior gentle pelvic tilts 10x3 to promote movement  ? ? ?Seated B ankle DF/PF to promote neural mobility 10x3 ? ? ? ?  ?Improved exercise technique, movement at target joints, use of target muscles after mod verbal, visual, tactile cues.  ? ? ? ?   ?Response to treatment ?Fair tolerance to today's session.  ?  ?  ?Clinical impression ? ?Pt demonstrates some decrease in overall pain and LE tightness based on subjective reports. Continued working on improving glute and trunk strength to decrease backward lean during standing tasks and ambulation to decrease stress to her low back. Fair tolerance to today's session. Pt will benefit from continued skilled physical therapy services to decrease pain, improve strength and function.  ?   ? ? ? ?PATIENT EDUCATION: ?Education details: there-ex, HEP ?Person educated: Patient ?Education method: Explanation, Demonstration, Tactile cues, Verbal cues, and Handouts ?Education comprehension: verbalized understanding and returned demonstration ? ? ?HOME EXERCISE PROGRAM: ?Access Code: [email or web address] ?URL: [email or web address] ?Date: 08/04/2021 ?Prepared by: Joneen Boers ? ?Exercises ?- Seated Transversus Abdominis Bracing  - 5 x  daily - 7 x weekly - 3 sets - 10 reps - 5 seconds hold ?- Seated Trunk Rotation - Arms Crossed  - 1 x daily - 7 x weekly - 3 sets - 10 reps - 5 seconds  hold ?- Standing Gluteal Sets  - 1 x daily - 7 x weekly - 3 sets - 10 reps - 5 seconds hold ?- Supine Double Knee to Chest  - 1 x daily - 7 x weekly - 2 sets - 15 reps (Discontinued 08/06/2021 secondary to increased symptoms)  ?- Seated Sciatic Tensioner  - 1 x daily - 7 x weekly - 2 sets - 15 reps (Discontinued 3/303/2023 due to increased symptoms.) ?- Supine Posterior Pelvic Tilt  - 1 x daily - 7 x weekly - 3 sets - 10 reps - 5 seconds hold ? ? ? PT Short Term Goals - 08/11/21 1152   ? ?  ? PT SHORT TERM GOAL #1  ? Title Pt will be independent with her initial HEP to decrease pain, improve strength and ability to ambulate longer distances.   ? Baseline Pt has started her initial HEP (07/09/2021); Has been doing her HEP. Does not have questions (08/11/2021)   ? Time 3   ? Period Weeks   ? Status Achieved   ? Target Date 07/30/21   ? ?  ?  ? ?   ? ? ? PT Long Term Goals - 08/11/21 1153   ? ?  ? PT LONG TERM GOAL #1  ? Title Pt will have a decrease in low back pain to 4/10 or less at worst to promote ability to ambulate longer distances as well as perform st

## 2021-08-26 ENCOUNTER — Ambulatory Visit: Payer: Medicare Other

## 2021-08-26 DIAGNOSIS — R262 Difficulty in walking, not elsewhere classified: Secondary | ICD-10-CM

## 2021-08-26 DIAGNOSIS — M545 Low back pain, unspecified: Secondary | ICD-10-CM

## 2021-08-26 DIAGNOSIS — M5416 Radiculopathy, lumbar region: Secondary | ICD-10-CM

## 2021-08-26 DIAGNOSIS — M6281 Muscle weakness (generalized): Secondary | ICD-10-CM

## 2021-08-26 NOTE — Therapy (Signed)
?OUTPATIENT PHYSICAL THERAPY TREATMENT NOTE ? ? ?Patient Name: Julia Deleon ?MRN: [id number] ?DOB:1945-11-18, 76 y.o., female ?Today's Date: 08/26/2021 ? ?PCP: Charlynne Cousins, MD ?REFERRING PROVIDER: Diamond Nickel, DO ? ? PT End of Session - 08/26/21 1151   ? ? Visit Number 14   ? Number of Visits 17   ? Date for PT Re-Evaluation 09/03/21   ? Authorization Type 4   ? Authorization Time Period of 10 progress report   ? PT Start Time 1151   ? PT Stop Time 1237   ? PT Time Calculation (min) 46 min   ? Activity Tolerance Patient tolerated treatment well   ? Behavior During Therapy [facility] for tasks assessed/performed   ? ?  ?  ? ?  ? ? ? ? ? ? ? ?Past Medical History:  ?Diagnosis Date  ? Arthritis   ? Chronic kidney disease   ? COPD (chronic obstructive pulmonary disease) ([place])   ? pt denies   ? Dyspnea   ? Family history of adverse reaction to anesthesia   ? father was slow to wake up  ? Fuch's endothelial dystrophy   ? GERD (gastroesophageal reflux disease)   ? Hyperlipidemia   ? Hypertension   ? Hypothyroidism   ? Pneumonia   ? hx of   ? PONV (postoperative nausea and vomiting)   ? slow to wake up and PONV  ? ?Past Surgical History:  ?Procedure Laterality Date  ? ABDOMINAL HYSTERECTOMY    ? APPENDECTOMY    ? BACK SURGERY  09/26/2018  ? L4-5 PLIF by Dr. Arnoldo Morale  ? CARDIAC CATHETERIZATION    ? CHOLECYSTECTOMY    ? COLONOSCOPY WITH PROPOFOL N/A 10/23/2020  ? Procedure: COLONOSCOPY WITH PROPOFOL;  Surgeon: Virgel Manifold, MD;  Location: [facility] ENDOSCOPY;  Service: Endoscopy;  Laterality: N/A;  ? ESOPHAGOGASTRODUODENOSCOPY N/A 10/23/2020  ? Procedure: ESOPHAGOGASTRODUODENOSCOPY (EGD);  Surgeon: Virgel Manifold, MD;  Location: [facility] ENDOSCOPY;  Service: Endoscopy;  Laterality: N/A;  ? EYE SURGERY    ? FOOT SURGERY Right   ? pin removed left  ? laser vein surgery    ? NASAL SINUS SURGERY    ? RIGHT/LEFT HEART CATH AND CORONARY ANGIOGRAPHY N/A 12/06/2017  ? Procedure: RIGHT/LEFT HEART CATH AND CORONARY ANGIOGRAPHY;  Surgeon:  Yolonda Kida, MD;  Location: [place] [facility];  Service: Cardiovascular;  Laterality: N/A;  ? ROBOTIC ASSISTED LAPAROSCOPIC SACROCOLPOPEXY Bilateral 07/30/2020  ? Procedure: XI ROBOTIC ASSISTED LAPAROSCOPIC SACROCOLPOPEXY AND SUPRACERVICAL HYSTERECTOMY WITH BILATERAL SALPINGO OOPHERECTOMY;  Surgeon: Ardis Hughs, MD;  Location: [facility];  Service: Urology;  Laterality: Bilateral;  REQUESTING 4 HRS  ? TONSILLECTOMY    ? ?Patient Active Problem List  ? Diagnosis Date Noted  ? Essential hypertension 01/23/2021  ? Cystocele with prolapse 07/30/2020  ? Postsurgical arthrodesis status 07/25/2020  ? Facet arthropathy 04/08/2020  ? Family history of breast cancer 11/16/2019  ? Body mass index (BMI) 25.0-25.9, adult 07/24/2019  ? Chronic UTI 12/06/2018  ? Status post lumbar surgery 12/06/2018  ? Spondylolisthesis, lumbar region 09/26/2018  ? Aortic atherosclerosis ([place]) 08/31/2018  ? Food sensitivity headache 03/29/2018  ? CAD (coronary artery disease) 02/23/2018  ? Advanced care planning/counseling discussion 03/28/2017  ? Varicose veins of both lower extremities with complications 92/42/6834  ? Arthritis 12/09/2016  ? Vaginal atrophy 09/10/2015  ? Vasomotor symptoms due to menopause 09/10/2015  ? Onychomycosis due to dermatophyte 04/07/2015  ? Bursitis of right shoulder 02/26/2015  ? Environmental and seasonal allergies 02/26/2015  ?  Other allergic rhinitis 02/26/2015  ? Osteoporosis 11/26/2014  ? COPD (chronic obstructive pulmonary disease) ([place]) 11/26/2014  ? Fuchs' corneal dystrophy 11/26/2014  ? Benign hypertensive renal disease 11/26/2014  ? Hyperlipidemia 11/26/2014  ? Chronic kidney disease, stage III (moderate) ([place]) 11/26/2014  ? Hypothyroidism 11/26/2014  ? Endothelial corneal dystrophy 11/26/2014  ? ? ? ?REFERRING DIAG: Chronic L low back pain ? ?THERAPY DIAG:  ?Chronic bilateral low back pain, unspecified whether sciatica present ? ?Muscle weakness (generalized) ? ?Difficulty in walking, not  elsewhere classified ? ?Radiculopathy, lumbar region ? ?PERTINENT HISTORY: Chronic low back pain. Had L4/5 fusion in her low back May 2020 which helped her sleep. Had PT following surgery which helped. Bowel and bladder are not perfect but improving. Had pelvic floor therapy which did not help. The bowel issues might have started after the back surgery. Feels like her urinary tract system quit due to the pain after back surgery. Was placed on a catheter for 6 months after the surgery. Denies saddle anesthesia. Pt also states having a kidney infection May 2022 and was diagnosed at the end of [place] 2022. Took all summer to get rid of the kidney infection which wiped her out and weakned her back.  ? ?PRECAUTIONS: No known precautions ? ?SUBJECTIVE: Back is doing ok, about a 3/10 currently. Back in her back brace around 5 pm. 7-8/10 at most for the past 7 days.  ? ? ? ? ? ? ? ? ?PAIN:  ?Are you having pain? 3/10 low back pain ? ? ? ?TODAY'S TREATMENT:  ?Objective  ?  ? ?Latex allergies ?  ? ? ?Therapeutic exercise ?   ?Bent over hip extension over table to promote glute max muscle activation ? R 10x2 ? L 10x2 ? ?Seated hip ER  ? L 10x ? R 10x ? ?Seated hip adduction isometrics small ball squeeze 10x5 seconds  ? ? ?Reclined position ? Hooklying  ?   ?  Lower trunk rotation 10x3 each side ?   ?  R piriformis stretch 2 minutes ? ?  Posterior pelvic tilt 10x5 seconds. Then 10x10 seconds for 2 sets  ?   ?  Crunches 5x. R lateral thigh symptoms L5 (not past knee) ?   Then mini crunches 5x. No increase in symptoms ? ? ?  Hip extension isometrics, leg straight to promote glute max muscle strengthening ?   R 10x5 seconds for 3 sets ?   L 10x5 seconds for 3 sets ? ? ?  ?Improved exercise technique, movement at target joints, use of target muscles after mod verbal, visual, tactile cues.  ? ? ?Manual therapy ?Reclined STM R hip flexor muscles (iliopsoas) to decrease tension  ? Decreased R lateral thigh tightness (L5) ?  ?Response to  treatment ?Fair tolerance to today's session.  ?  ?  ?Clinical impression ?Decreased R L5 tightness after treatment to decrease R hip flexor tension to decrease stress to low back. Continued working on improving glute and trunk strength to decrease backward lean during standing tasks and ambulation to decrease stress to her low back. Pt tolerated session well without aggravation of symptoms. Pt will benefit from continued skilled physical therapy services to decrease pain, improve strength and function.  ?   ? ? ? ?PATIENT EDUCATION: ?Education details: there-ex, HEP ?Person educated: Patient ?Education method: Explanation, Demonstration, Tactile cues, Verbal cues, and Handouts ?Education comprehension: verbalized understanding and returned demonstration ? ? ?HOME EXERCISE PROGRAM: ?Access Code: [email or web address] ?URL: [email or web address] ?Date: 08/04/2021 ?Prepared by:  [facility] ? ?Exercises ?- Seated Transversus Abdominis Bracing  - 5 x daily - 7 x weekly - 3 sets - 10 reps - 5 seconds hold ?- Seated Trunk Rotation - Arms Crossed  - 1 x daily - 7 x weekly - 3 sets - 10 reps - 5 seconds  hold ?- Standing Gluteal Sets  - 1 x daily - 7 x weekly - 3 sets - 10 reps - 5 seconds hold ?- Supine Double Knee to Chest  - 1 x daily - 7 x weekly - 2 sets - 15 reps (Discontinued 08/06/2021 secondary to increased symptoms)  ?- Seated Sciatic Tensioner  - 1 x daily - 7 x weekly - 2 sets - 15 reps (Discontinued 3/303/2023 due to increased symptoms.) ?- Supine Posterior Pelvic Tilt  - 1 x daily - 7 x weekly - 3 sets - 10 reps - 5 seconds hold ? ? ? PT Short Term Goals - 08/11/21 1152   ? ?  ? PT SHORT TERM GOAL #1  ? Title Pt will be independent with her initial HEP to decrease pain, improve strength and ability to ambulate longer distances.   ? Baseline Pt has started her initial HEP (07/09/2021); Has been doing her HEP. Does not have questions (08/11/2021)   ? Time 3   ? Period Weeks   ? Status Achieved   ? Target Date  07/30/21   ? ?  ?  ? ?  ? ? ? PT Long Term Goals - 08/11/21 1153   ? ?  ? PT LONG TERM GOAL #1  ? Title Pt will have a decrease in low back pain to 4/10 or less at worst to promote ability to ambulate longer

## 2021-09-01 ENCOUNTER — Ambulatory Visit: Payer: Medicare Other

## 2021-09-01 DIAGNOSIS — M545 Low back pain, unspecified: Secondary | ICD-10-CM

## 2021-09-01 DIAGNOSIS — R262 Difficulty in walking, not elsewhere classified: Secondary | ICD-10-CM

## 2021-09-01 DIAGNOSIS — M6281 Muscle weakness (generalized): Secondary | ICD-10-CM

## 2021-09-01 DIAGNOSIS — M5416 Radiculopathy, lumbar region: Secondary | ICD-10-CM

## 2021-09-01 NOTE — Therapy (Signed)
?OUTPATIENT PHYSICAL THERAPY TREATMENT NOTE ? ? ?Patient Name: Julia Deleon ?MRN: [id number] ?DOB:February 26, 1946, 76 y.o., female ?Today's Date: 09/01/2021 ? ?PCP: Charlynne Cousins, MD ?REFERRING PROVIDER: Diamond Nickel, DO ? ? PT End of Session - 09/01/21 1551   ? ? Visit Number 15   ? Number of Visits 33   ? Date for PT Re-Evaluation 10/29/21   ? Authorization Type 5   ? Authorization Time Period of 10 progress report   ? PT Start Time 1551   ? PT Stop Time 7867   ? PT Time Calculation (min) 41 min   ? Activity Tolerance Patient tolerated treatment well   ? Behavior During Therapy [facility] for tasks assessed/performed   ? ?  ?  ? ?  ? ? ? ? ? ? ? ? ?Past Medical History:  ?Diagnosis Date  ? Arthritis   ? Chronic kidney disease   ? COPD (chronic obstructive pulmonary disease) ([place])   ? pt denies   ? Dyspnea   ? Family history of adverse reaction to anesthesia   ? father was slow to wake up  ? Fuch's endothelial dystrophy   ? GERD (gastroesophageal reflux disease)   ? Hyperlipidemia   ? Hypertension   ? Hypothyroidism   ? Pneumonia   ? hx of   ? PONV (postoperative nausea and vomiting)   ? slow to wake up and PONV  ? ?Past Surgical History:  ?Procedure Laterality Date  ? ABDOMINAL HYSTERECTOMY    ? APPENDECTOMY    ? BACK SURGERY  09/26/2018  ? L4-5 PLIF by Dr. Arnoldo Morale  ? CARDIAC CATHETERIZATION    ? CHOLECYSTECTOMY    ? COLONOSCOPY WITH PROPOFOL N/A 10/23/2020  ? Procedure: COLONOSCOPY WITH PROPOFOL;  Surgeon: Virgel Manifold, MD;  Location: [facility] ENDOSCOPY;  Service: Endoscopy;  Laterality: N/A;  ? ESOPHAGOGASTRODUODENOSCOPY N/A 10/23/2020  ? Procedure: ESOPHAGOGASTRODUODENOSCOPY (EGD);  Surgeon: Virgel Manifold, MD;  Location[facility] ENDOSCOPY;  Service: Endoscopy;  Laterality: N/A;  ? EYE SURGERY    ? FOOT SURGERY Right   ? pin removed left  ? laser vein surgery    ? NASAL SINUS SURGERY    ? RIGHT/LEFT HEART CATH AND CORONARY ANGIOGRAPHY N/A 12/06/2017  ? Procedure: RIGHT/LEFT HEART CATH AND CORONARY ANGIOGRAPHY;   Surgeon: Yolonda Kida, MD;  Location: [place] [facility];  Service: Cardiovascular;  Laterality: N/A;  ? ROBOTIC ASSISTED LAPAROSCOPIC SACROCOLPOPEXY Bilateral 07/30/2020  ? Procedure: XI ROBOTIC ASSISTED LAPAROSCOPIC SACROCOLPOPEXY AND SUPRACERVICAL HYSTERECTOMY WITH BILATERAL SALPINGO OOPHERECTOMY;  Surgeon: Ardis Hughs, MD;  Location: [facility];  Service: Urology;  Laterality: Bilateral;  REQUESTING 4 HRS  ? TONSILLECTOMY    ? ?Patient Active Problem List  ? Diagnosis Date Noted  ? Essential hypertension 01/23/2021  ? Cystocele with prolapse 07/30/2020  ? Postsurgical arthrodesis status 07/25/2020  ? Facet arthropathy 04/08/2020  ? Family history of breast cancer 11/16/2019  ? Body mass index (BMI) 25.0-25.9, adult 07/24/2019  ? Chronic UTI 12/06/2018  ? Status post lumbar surgery 12/06/2018  ? Spondylolisthesis, lumbar region 09/26/2018  ? Aortic atherosclerosis ([place]) 08/31/2018  ? Food sensitivity headache 03/29/2018  ? CAD (coronary artery disease) 02/23/2018  ? Advanced care planning/counseling discussion 03/28/2017  ? Varicose veins of both lower extremities with complications 67/20/9470  ? Arthritis 12/09/2016  ? Vaginal atrophy 09/10/2015  ? Vasomotor symptoms due to menopause 09/10/2015  ? Onychomycosis due to dermatophyte 04/07/2015  ? Bursitis of right shoulder 02/26/2015  ? Environmental and seasonal allergies 02/26/2015  ?  Other allergic rhinitis 02/26/2015  ? Osteoporosis 11/26/2014  ? COPD (chronic obstructive pulmonary disease) ([place]) 11/26/2014  ? Fuchs' corneal dystrophy 11/26/2014  ? Benign hypertensive renal disease 11/26/2014  ? Hyperlipidemia 11/26/2014  ? Chronic kidney disease, stage III (moderate) ([place]) 11/26/2014  ? Hypothyroidism 11/26/2014  ? Endothelial corneal dystrophy 11/26/2014  ? ? ? ?REFERRING DIAG: Chronic L low back pain ? ?THERAPY DIAG:  ?Chronic bilateral low back pain, unspecified whether sciatica present ? ?Muscle weakness (generalized) ? ?Difficulty in walking,  not elsewhere classified ? ?Radiculopathy, lumbar region ? ?PERTINENT HISTORY: Chronic low back pain. Had L4/5 fusion in her low back May 2020 which helped her sleep. Had PT following surgery which helped. Bowel and bladder are not perfect but improving. Had pelvic floor therapy which did not help. The bowel issues might have started after the back surgery. Feels like her urinary tract system quit due to the pain after back surgery. Was placed on a catheter for 6 months after the surgery. Denies saddle anesthesia. Pt also states having a kidney infection May 2022 and was diagnosed at the end of [place] 2022. Took all summer to get rid of the kidney infection which wiped her out and weakned her back.  ? ?PRECAUTIONS: No known precautions ? ?SUBJECTIVE: Back is doing ok, about a 3/10 currently. Might be slightly better after last session. Usually has to wear a back brace in the evening to make dinner.  ? ? ? ?PAIN:  ?Are you having pain? 3/10 low back pain ? ? ? ?TODAY'S TREATMENT:  ?Objective  ?  ? ?Latex allergies ?  ? ? ?Therapeutic exercise ?   ?Bent over hip extension over table to promote glute max muscle activation ? R 10x3 ? L 10x3 ? ?Seated manually resisted hip extension, abduction and ER 1x each way for each LE  ? ?Reviewed POC: 2x/week for 8 weeks.  ? ?Reviewed progress/current status with PT with Pt.  ? ?Seated hip ER  ? L 10x5 seconds ? R 10x5 seconds ? ?Seated hip adduction isometrics small ball squeeze 10x10 seconds  ? ? ?Reclined position ? Hooklying  ?  ?  Hip extension isometrics, leg straight to promote glute max muscle strengthening ?   R 10x5 seconds for 2 sets ?   L 10x5 seconds for 2 sets ? ? ? ? ?  ?Improved exercise technique, movement at target joints, use of target muscles after mod verbal, visual, tactile cues.  ? ? ?Manual therapy  ?Reclined STM R hip flexor muscles (iliopsoas) to decrease tension  ?  ?Reclined STM L hip flexor muscles to decrease tension  ?  ? ? ? ? ? ?Response to  treatment ?Pt tolerated session well without aggravation of symptoms.  ?  ?  ?Clinical impression ?Pt demonstrates improved bilateral hip extension strength and function (improved FOTO score), and overall decreased low back pain by 1-2 points since initial evaluation. Pt making slow progress with PT towards goals. Challenges to progress include chronicity of condition. Continued working on improving glute and trunk strength to decrease backward lean during standing tasks and ambulation to decrease stress to her low back. Pt tolerated session well without aggravation of symptoms. Pt will benefit from continued skilled physical therapy services to decrease pain, improve strength and function.  ?   ? ? ? ?PATIENT EDUCATION: ?Education details: there-ex, HEP ?Person educated: Patient ?Education method: Explanation, Demonstration, Tactile cues, Verbal cues, and Handouts ?Education comprehension: verbalized understanding and returned demonstration ? ? ?HOME EXERCISE PROGRAM: ?  Access Code: [email or web address] ?URL: [email or web address] ?Date: 08/04/2021 ?Prepared by: Joneen Boers ? ?Exercises ?- Seated Transversus Abdominis Bracing  - 5 x daily - 7 x weekly - 3 sets - 10 reps - 5 seconds hold ?- Seated Trunk Rotation - Arms Crossed  - 1 x daily - 7 x weekly - 3 sets - 10 reps - 5 seconds  hold ?- Standing Gluteal Sets  - 1 x daily - 7 x weekly - 3 sets - 10 reps - 5 seconds hold ?- Supine Double Knee to Chest  - 1 x daily - 7 x weekly - 2 sets - 15 reps (Discontinued 08/06/2021 secondary to increased symptoms)  ?- Seated Sciatic Tensioner  - 1 x daily - 7 x weekly - 2 sets - 15 reps (Discontinued 3/303/2023 due to increased symptoms.) ?- Supine Posterior Pelvic Tilt  - 1 x daily - 7 x weekly - 3 sets - 10 reps - 5 seconds hold ? ? ? PT Short Term Goals - 08/11/21 1152   ? ?  ? PT SHORT TERM GOAL #1  ? Title Pt will be independent with her initial HEP to decrease pain, improve strength and ability to ambulate longer  distances.   ? Baseline Pt has started her initial HEP (07/09/2021); Has been doing her HEP. Does not have questions (08/11/2021)   ? Time 3   ? Period Weeks   ? Status Achieved   ? Target Date 07/30/21   ? ?  ?  ? ?  ? ? ?

## 2021-09-03 ENCOUNTER — Ambulatory Visit: Payer: Medicare Other

## 2021-09-03 DIAGNOSIS — G8929 Other chronic pain: Secondary | ICD-10-CM

## 2021-09-03 DIAGNOSIS — M62838 Other muscle spasm: Secondary | ICD-10-CM

## 2021-09-03 DIAGNOSIS — M6281 Muscle weakness (generalized): Secondary | ICD-10-CM

## 2021-09-03 DIAGNOSIS — R262 Difficulty in walking, not elsewhere classified: Secondary | ICD-10-CM

## 2021-09-03 DIAGNOSIS — M5416 Radiculopathy, lumbar region: Secondary | ICD-10-CM

## 2021-09-03 DIAGNOSIS — M545 Low back pain, unspecified: Secondary | ICD-10-CM | POA: Diagnosis not present

## 2021-09-03 NOTE — Therapy (Signed)
?OUTPATIENT PHYSICAL THERAPY TREATMENT NOTE ? ? ?Patient Name: Julia Deleon ?MRN: [id number] ?DOB:24-Oct-1945, 76 y.o., female ?Today's Date: 09/03/2021 ? ?PCP: Charlynne Cousins, MD ?REFERRING PROVIDER: Diamond Nickel, DO ? ? PT End of Session - 09/03/21 1506   ? ? Visit Number 16   ? Number of Visits 33   ? Date for PT Re-Evaluation 10/29/21   ? Authorization Type 6   ? Authorization Time Period of 10 progress report   ? PT Start Time 1506   ? PT Stop Time 0865   ? PT Time Calculation (min) 39 min   ? Activity Tolerance Patient tolerated treatment well   ? Behavior During Therapy [facility] for tasks assessed/performed   ? ?  ?  ? ?  ? ? ? ? ? ? ? ? ? ?Past Medical History:  ?Diagnosis Date  ? Arthritis   ? Chronic kidney disease   ? COPD (chronic obstructive pulmonary disease) ([place])   ? pt denies   ? Dyspnea   ? Family history of adverse reaction to anesthesia   ? father was slow to wake up  ? Fuch's endothelial dystrophy   ? GERD (gastroesophageal reflux disease)   ? Hyperlipidemia   ? Hypertension   ? Hypothyroidism   ? Pneumonia   ? hx of   ? PONV (postoperative nausea and vomiting)   ? slow to wake up and PONV  ? ?Past Surgical History:  ?Procedure Laterality Date  ? ABDOMINAL HYSTERECTOMY    ? APPENDECTOMY    ? BACK SURGERY  09/26/2018  ? L4-5 PLIF by Dr. Arnoldo Morale  ? CARDIAC CATHETERIZATION    ? CHOLECYSTECTOMY    ? COLONOSCOPY WITH PROPOFOL N/A 10/23/2020  ? Procedure: COLONOSCOPY WITH PROPOFOL;  Surgeon: Virgel Manifold, MD;  Location: [facility] ENDOSCOPY;  Service: Endoscopy;  Laterality: N/A;  ? ESOPHAGOGASTRODUODENOSCOPY N/A 10/23/2020  ? Procedure: ESOPHAGOGASTRODUODENOSCOPY (EGD);  Surgeon: Virgel Manifold, MD;  [facility] ENDOSCOPY;  Service: Endoscopy;  Laterality: N/A;  ? EYE SURGERY    ? FOOT SURGERY Right   ? pin removed left  ? laser vein surgery    ? NASAL SINUS SURGERY    ? RIGHT/LEFT HEART CATH AND CORONARY ANGIOGRAPHY N/A 12/06/2017  ? Procedure: RIGHT/LEFT HEART CATH AND CORONARY ANGIOGRAPHY;   Surgeon: Yolonda Kida, MD;  Location: [place] [facility];  Service: Cardiovascular;  Laterality: N/A;  ? ROBOTIC ASSISTED LAPAROSCOPIC SACROCOLPOPEXY Bilateral 07/30/2020  ? Procedure: XI ROBOTIC ASSISTED LAPAROSCOPIC SACROCOLPOPEXY AND SUPRACERVICAL HYSTERECTOMY WITH BILATERAL SALPINGO OOPHERECTOMY;  Surgeon: Ardis Hughs, MD;  Location: [facility]S;  Service: Urology;  Laterality: Bilateral;  REQUESTING 4 HRS  ? TONSILLECTOMY    ? ?Patient Active Problem List  ? Diagnosis Date Noted  ? Essential hypertension 01/23/2021  ? Cystocele with prolapse 07/30/2020  ? Postsurgical arthrodesis status 07/25/2020  ? Facet arthropathy 04/08/2020  ? Family history of breast cancer 11/16/2019  ? Body mass index (BMI) 25.0-25.9, adult 07/24/2019  ? Chronic UTI 12/06/2018  ? Status post lumbar surgery 12/06/2018  ? Spondylolisthesis, lumbar region 09/26/2018  ? Aortic atherosclerosis ([place]) 08/31/2018  ? Food sensitivity headache 03/29/2018  ? CAD (coronary artery disease) 02/23/2018  ? Advanced care planning/counseling discussion 03/28/2017  ? Varicose veins of both lower extremities with complications 78/46/9629  ? Arthritis 12/09/2016  ? Vaginal atrophy 09/10/2015  ? Vasomotor symptoms due to menopause 09/10/2015  ? Onychomycosis due to dermatophyte 04/07/2015  ? Bursitis of right shoulder 02/26/2015  ? Environmental and seasonal allergies  02/26/2015  ? Other allergic rhinitis 02/26/2015  ? Osteoporosis 11/26/2014  ? COPD (chronic obstructive pulmonary disease) ([place]) 11/26/2014  ? Fuchs' corneal dystrophy 11/26/2014  ? Benign hypertensive renal disease 11/26/2014  ? Hyperlipidemia 11/26/2014  ? Chronic kidney disease, stage III (moderate) ([place]) 11/26/2014  ? Hypothyroidism 11/26/2014  ? Endothelial corneal dystrophy 11/26/2014  ? ? ? ?REFERRING DIAG: Chronic L low back pain ? ?THERAPY DIAG:  ?Chronic bilateral low back pain, unspecified whether sciatica present ? ?Muscle weakness (generalized) ? ?Difficulty in walking,  not elsewhere classified ? ?Radiculopathy, lumbar region ? ?PERTINENT HISTORY: Chronic low back pain. Had L4/5 fusion in her low back May 2020 which helped her sleep. Had PT following surgery which helped. Bowel and bladder are not perfect but improving. Had pelvic floor therapy which did not help. The bowel issues might have started after the back surgery. Feels like her urinary tract system quit due to the pain after back surgery. Was placed on a catheter for 6 months after the surgery. Denies saddle anesthesia. Pt also states having a kidney infection May 2022 and was diagnosed at the end of [place] 2022. Took all summer to get rid of the kidney infection which wiped her out and weakned her back.  ? ?PRECAUTIONS: No known precautions ? ?SUBJECTIVE: Back is probably about a 4/10 currently because its the afternoon. Has also been running errands this morning.  ? ? ? ? ? ? ?PAIN:  ?Are you having pain? 4/10 low back pain ? ? ? ?TODAY'S TREATMENT:  ?Objective  ?  ? ?Latex allergies ?  ? ? ?Therapeutic exercise ?   ?Seated manually resisted R lateral shift isometrics in neutral to promote more neutral posture 10x2 with 5 second holds ? R L5 dermatome tightness to knee ? ? ?Bent over hip extension over table to promote glute max muscle activation ?            R 10x3 ?            L 10x3 ?  ? ? ?  ?Reclined position ?            Hooklying  ?                         ?                        Lower trunk rotation 10x2 each side ?                          ?                        Posterior pelvic tilt 10x5 seconds. Then 10x10 seconds for 2 sets    ? ? ?                        Hip extension isometrics, leg straight to promote glute max muscle strengthening ?                                    R 10x5 seconds for 3 sets ?  L 10x5 seconds for 3 sets ?  ?  Clamshell green band 10x ? ?  Hip adduction isometrics small ball squeeze 10x5 seconds for 3 sets ? ? ? ?Improved exercise technique, movement at  target joints, use of target muscles after mod verbal, visual, tactile cues.  ?  ? ? ? ?Response to treatment ? ?Fair tolerance to today's session. Just low back tiredness and ache after session. Decreased B LE tightness reported.  ?  ?  ?Clinical impression ?Continued working on posture, and improving glute and trunk strength to decrease backward lean during standing tasks and ambulation to decrease stress to her low back. Fair tolerance to today's session. Decreased LE tightness reported after session. Pt will benefit from continued skilled physical therapy services to decrease pain, improve strength and function.  ?   ? ? ? ?PATIENT EDUCATION: ?Education details: there-ex, HEP ?Person educated: Patient ?Education method: Explanation, Demonstration, Tactile cues, Verbal cues, and Handouts ?Education comprehension: verbalized understanding and returned demonstration ? ? ?HOME EXERCISE PROGRAM: ?Access Code: [email or web address] ?URL: [email or web address] ?Date: 08/04/2021 ?Prepared by: Joneen Boers ? ?Exercises ?- Seated Transversus Abdominis Bracing  - 5 x daily - 7 x weekly - 3 sets - 10 reps - 5 seconds hold ?- Seated Trunk Rotation - Arms Crossed  - 1 x daily - 7 x weekly - 3 sets - 10 reps - 5 seconds  hold ?- Standing Gluteal Sets  - 1 x daily - 7 x weekly - 3 sets - 10 reps - 5 seconds hold ?- Supine Double Knee to Chest  - 1 x daily - 7 x weekly - 2 sets - 15 reps (Discontinued 08/06/2021 secondary to increased symptoms)  ?- Seated Sciatic Tensioner  - 1 x daily - 7 x weekly - 2 sets - 15 reps (Discontinued 3/303/2023 due to increased symptoms.) ?- Supine Posterior Pelvic Tilt  - 1 x daily - 7 x weekly - 3 sets - 10 reps - 5 seconds hold ?-Reclined Hip extension isometrics, leg straight to promote glute max muscle strengthening ?                                    R 10x5 seconds for 3 sets ?                                    L 10x5 seconds for 3 sets ? ? ? ? ? PT Short Term Goals - 08/11/21 1152   ? ?  ?  PT SHORT TERM GOAL #1  ? Title Pt will be independent with her initial HEP to decrease pain, improve strength and ability to ambulate longer distances.   ? Baseline Pt has started her initial HEP (3/2/2

## 2021-09-07 ENCOUNTER — Ambulatory Visit: Payer: Medicare Other

## 2021-09-15 ENCOUNTER — Ambulatory Visit: Payer: Medicare Other | Attending: Sports Medicine

## 2021-09-15 DIAGNOSIS — M545 Low back pain, unspecified: Secondary | ICD-10-CM | POA: Diagnosis present

## 2021-09-15 DIAGNOSIS — R262 Difficulty in walking, not elsewhere classified: Secondary | ICD-10-CM | POA: Insufficient documentation

## 2021-09-15 DIAGNOSIS — M62838 Other muscle spasm: Secondary | ICD-10-CM | POA: Diagnosis present

## 2021-09-15 DIAGNOSIS — M5416 Radiculopathy, lumbar region: Secondary | ICD-10-CM | POA: Insufficient documentation

## 2021-09-15 DIAGNOSIS — M6281 Muscle weakness (generalized): Secondary | ICD-10-CM | POA: Diagnosis present

## 2021-09-15 DIAGNOSIS — G8929 Other chronic pain: Secondary | ICD-10-CM | POA: Insufficient documentation

## 2021-09-15 NOTE — Therapy (Signed)
?OUTPATIENT PHYSICAL THERAPY TREATMENT NOTE ? ? ?Patient Name: Julia Deleon ?MRN: [id number] ?DOB:Sep 20, 1945, 76 y.o., female ?Today's Date: 09/15/2021 ? ?PCP: Charlynne Cousins, MD ?REFERRING PROVIDER: Diamond Nickel, DO ? ? PT End of Session - 09/15/21 1018   ? ? Visit Number 17   ? Number of Visits 33   ? Date for PT Re-Evaluation 10/29/21   ? Authorization Type 7   ? Authorization Time Period of 10 progress report   ? PT Start Time 1019   ? PT Stop Time 2671   ? PT Time Calculation (min) 44 min   ? Activity Tolerance Patient tolerated treatment well   ? Behavior During Therapy [facility] for tasks assessed/performed   ? ?  ?  ? ?  ? ? ? ? ? ? ? ? ? ? ?Past Medical History:  ?Diagnosis Date  ? Arthritis   ? Chronic kidney disease   ? COPD (chronic obstructive pulmonary disease) ([place])   ? pt denies   ? Dyspnea   ? Family history of adverse reaction to anesthesia   ? father was slow to wake up  ? Fuch's endothelial dystrophy   ? GERD (gastroesophageal reflux disease)   ? Hyperlipidemia   ? Hypertension   ? Hypothyroidism   ? Pneumonia   ? hx of   ? PONV (postoperative nausea and vomiting)   ? slow to wake up and PONV  ? ?Past Surgical History:  ?Procedure Laterality Date  ? ABDOMINAL HYSTERECTOMY    ? APPENDECTOMY    ? BACK SURGERY  09/26/2018  ? L4-5 PLIF by Dr. Arnoldo Morale  ? CARDIAC CATHETERIZATION    ? CHOLECYSTECTOMY    ? COLONOSCOPY WITH PROPOFOL N/A 10/23/2020  ? Procedure: COLONOSCOPY WITH PROPOFOL;  Surgeon: Virgel Manifold, MD;  Location: [facility] ENDOSCOPY;  Service: Endoscopy;  Laterality: N/A;  ? ESOPHAGOGASTRODUODENOSCOPY N/A 10/23/2020  ? Procedure: ESOPHAGOGASTRODUODENOSCOPY (EGD);  Surgeon: Virgel Manifold, MD;  Location: [facility] ENDOSCOPY;  Service: Endoscopy;  Laterality: N/A;  ? EYE SURGERY    ? FOOT SURGERY Right   ? pin removed left  ? laser vein surgery    ? NASAL SINUS SURGERY    ? RIGHT/LEFT HEART CATH AND CORONARY ANGIOGRAPHY N/A 12/06/2017  ? Procedure: RIGHT/LEFT HEART CATH AND CORONARY ANGIOGRAPHY;   Surgeon: Yolonda Kida, MD;  Location: [place] CV [facility];  Service: Cardiovascular;  Laterality: N/A;  ? ROBOTIC ASSISTED LAPAROSCOPIC SACROCOLPOPEXY Bilateral 07/30/2020  ? Procedure: XI ROBOTIC ASSISTED LAPAROSCOPIC SACROCOLPOPEXY AND SUPRACERVICAL HYSTERECTOMY WITH BILATERAL SALPINGO OOPHERECTOMY;  Surgeon: Ardis Hughs, MD;  Location: [facility];  Service: Urology;  Laterality: Bilateral;  REQUESTING 4 HRS  ? TONSILLECTOMY    ? ?Patient Active Problem List  ? Diagnosis Date Noted  ? Essential hypertension 01/23/2021  ? Cystocele with prolapse 07/30/2020  ? Postsurgical arthrodesis status 07/25/2020  ? Facet arthropathy 04/08/2020  ? Family history of breast cancer 11/16/2019  ? Body mass index (BMI) 25.0-25.9, adult 07/24/2019  ? Chronic UTI 12/06/2018  ? Status post lumbar surgery 12/06/2018  ? Spondylolisthesis, lumbar region 09/26/2018  ? Aortic atherosclerosis ([place]) 08/31/2018  ? Food sensitivity headache 03/29/2018  ? CAD (coronary artery disease) 02/23/2018  ? Advanced care planning/counseling discussion 03/28/2017  ? Varicose veins of both lower extremities with complications 24/58/0998  ? Arthritis 12/09/2016  ? Vaginal atrophy 09/10/2015  ? Vasomotor symptoms due to menopause 09/10/2015  ? Onychomycosis due to dermatophyte 04/07/2015  ? Bursitis of right shoulder 02/26/2015  ? Environmental and seasonal  allergies 02/26/2015  ? Other allergic rhinitis 02/26/2015  ? Osteoporosis 11/26/2014  ? COPD (chronic obstructive pulmonary disease) ([place]) 11/26/2014  ? Fuchs' corneal dystrophy 11/26/2014  ? Benign hypertensive renal disease 11/26/2014  ? Hyperlipidemia 11/26/2014  ? Chronic kidney disease, stage III (moderate) ([place]) 11/26/2014  ? Hypothyroidism 11/26/2014  ? Endothelial corneal dystrophy 11/26/2014  ? ? ? ?REFERRING DIAG: Chronic L low back pain ? ?THERAPY DIAG:  ?Chronic bilateral low back pain, unspecified whether sciatica present ? ?Muscle weakness (generalized) ? ?Difficulty in walking,  not elsewhere classified ? ?Radiculopathy, lumbar region ? ?PERTINENT HISTORY: Chronic low back pain. Had L4/5 fusion in her low back May 2020 which helped her sleep. Had PT following surgery which helped. Bowel and bladder are not perfect but improving. Had pelvic floor therapy which did not help. The bowel issues might have started after the back surgery. Feels like her urinary tract system quit due to the pain after back surgery. Was placed on a catheter for 6 months after the surgery. Denies saddle anesthesia. Pt also states having a kidney infection May 2022 and was diagnosed at the end of [place] 2022. Took all summer to get rid of the kidney infection which wiped her out and weakned her back.  ? ?PRECAUTIONS: No known precautions ? ?SUBJECTIVE: The covid (husband got covid, she did not) bothered her back, just the stress of the situation. Was very frustrated. Just a little bit of tightness in the R thigh.  ? ? ? ?PAIN:  ?Are you having pain? 3/10 low back pain ? ? ?TODAY'S TREATMENT:  ?Objective  ?  ? ?Latex allergies ?  ?Therapeutic exercise ?   ?Seated manually resisted R lateral shift isometrics in neutral to promote more neutral posture 10x2 with 5 second holds ? R L5 dermatome tightness to knee ? ? ?Bent over hip extension over table to promote glute max muscle activation ?            R 10x3 ?            L 10x3 ?  ?Reclined position ?            Hooklying  ?                         ?                        Lower trunk rotation 10x3 each side ?                          ?                        Posterior pelvic tilt 10x5 seconds for 3 sets    ? ? ? Clamshell red band 10x3 ?  Increased B lateral thigh tightness ? ? Hip adduction isometrics small ball squeeze 10x5 seconds for 3 sets ? ? ?                        Hip extension isometrics, leg straight to promote glute max muscle strengthening ?                                    R 10x5 seconds for 3 sets ?  L 10x5 seconds for 3  sets ?  ? ?Improved exercise technique, movement at target joints, use of target muscles after mod verbal, visual, tactile cues.  ?  ? ?Response to treatment ? ?Fair tolerance to today's session.  ? ?  ?  ?Clinical impression ?Continued working on posture, and improving glute and trunk strength to decrease backward lean during standing tasks and ambulation to decrease stress to her low back. Fair tolerance to today's session. Pt will benefit from continued skilled physical therapy services to decrease pain, improve strength and function.  ?   ? ? ? ?PATIENT EDUCATION: ?Education details: there-ex, HEP ?Person educated: Patient ?Education method: Explanation, Demonstration, Tactile cues, Verbal cues, and Handouts ?Education comprehension: verbalized understanding and returned demonstration ? ? ?HOME EXERCISE PROGRAM: ?Access Code: [email or web address] ?URL: [email or web address] ?Date: 08/04/2021 ?Prepared by: Joneen Boers ? ?Exercises ?- Seated Transversus Abdominis Bracing  - 5 x daily - 7 x weekly - 3 sets - 10 reps - 5 seconds hold ?- Seated Trunk Rotation - Arms Crossed  - 1 x daily - 7 x weekly - 3 sets - 10 reps - 5 seconds  hold ?- Standing Gluteal Sets  - 1 x daily - 7 x weekly - 3 sets - 10 reps - 5 seconds hold ?- Supine Double Knee to Chest  - 1 x daily - 7 x weekly - 2 sets - 15 reps (Discontinued 08/06/2021 secondary to increased symptoms)  ?- Seated Sciatic Tensioner  - 1 x daily - 7 x weekly - 2 sets - 15 reps (Discontinued 3/303/2023 due to increased symptoms.) ?- Supine Posterior Pelvic Tilt  - 1 x daily - 7 x weekly - 3 sets - 10 reps - 5 seconds hold ?-Reclined Hip extension isometrics, leg straight to promote glute max muscle strengthening ?                                    R 10x5 seconds for 3 sets ?                                    L 10x5 seconds for 3 sets ? ? ? ? ? PT Short Term Goals - 08/11/21 1152   ? ?  ? PT SHORT TERM GOAL #1  ? Title Pt will be independent with her initial HEP to  decrease pain, improve strength and ability to ambulate longer distances.   ? Baseline Pt has started her initial HEP (07/09/2021); Has been doing her HEP. Does not have questions (08/11/2021)   ? Time 3   ? P

## 2021-09-17 ENCOUNTER — Ambulatory Visit: Payer: Medicare Other

## 2021-09-17 DIAGNOSIS — R262 Difficulty in walking, not elsewhere classified: Secondary | ICD-10-CM

## 2021-09-17 DIAGNOSIS — M545 Low back pain, unspecified: Secondary | ICD-10-CM | POA: Diagnosis not present

## 2021-09-17 DIAGNOSIS — M6281 Muscle weakness (generalized): Secondary | ICD-10-CM

## 2021-09-17 DIAGNOSIS — M5416 Radiculopathy, lumbar region: Secondary | ICD-10-CM

## 2021-09-17 NOTE — Therapy (Signed)
?OUTPATIENT PHYSICAL THERAPY TREATMENT NOTE ? ? ?Patient Name: Julia Deleon ?MRN: [id number] ?DOB:Sep 01, 1945, 76 y.o., female ?Today's Date: 09/17/2021 ? ?PCP: Charlynne Cousins, MD ?REFERRING PROVIDER: Diamond Nickel, DO ? ? PT End of Session - 09/17/21 1502   ? ? Visit Number 18   ? Number of Visits 33   ? Date for PT Re-Evaluation 10/29/21   ? Authorization Type 8   ? Authorization Time Period of 10 progress report   ? PT Start Time 1502   ? PT Stop Time 7035   ? PT Time Calculation (min) 46 min   ? Activity Tolerance Patient tolerated treatment well   ? Behavior During Therapy [facility] for tasks assessed/performed   ? ?  ?  ? ?  ? ? ? ? ? ? ? ? ? ? ? ?Past Medical History:  ?Diagnosis Date  ? Arthritis   ? Chronic kidney disease   ? COPD (chronic obstructive pulmonary disease) ([place])   ? pt denies   ? Dyspnea   ? Family history of adverse reaction to anesthesia   ? father was slow to wake up  ? Fuch's endothelial dystrophy   ? GERD (gastroesophageal reflux disease)   ? Hyperlipidemia   ? Hypertension   ? Hypothyroidism   ? Pneumonia   ? hx of   ? PONV (postoperative nausea and vomiting)   ? slow to wake up and PONV  ? ?Past Surgical History:  ?Procedure Laterality Date  ? ABDOMINAL HYSTERECTOMY    ? APPENDECTOMY    ? BACK SURGERY  09/26/2018  ? L4-5 PLIF by Dr. Arnoldo Morale  ? CARDIAC CATHETERIZATION    ? CHOLECYSTECTOMY    ? COLONOSCOPY WITH PROPOFOL N/A 10/23/2020  ? Procedure: COLONOSCOPY WITH PROPOFOL;  Surgeon: Virgel Manifold, MD;  Location: [facility] ENDOSCOPY;  Service: Endoscopy;  Laterality: N/A;  ? ESOPHAGOGASTRODUODENOSCOPY N/A 10/23/2020  ? Procedure: ESOPHAGOGASTRODUODENOSCOPY (EGD);  Surgeon: Virgel Manifold, MD;  Location: [facility] ENDOSCOPY;  Service: Endoscopy;  Laterality: N/A;  ? EYE SURGERY    ? FOOT SURGERY Right   ? pin removed left  ? laser vein surgery    ? NASAL SINUS SURGERY    ? RIGHT/LEFT HEART CATH AND CORONARY ANGIOGRAPHY N/A 12/06/2017  ? Procedure: RIGHT/LEFT HEART CATH AND CORONARY ANGIOGRAPHY;   Surgeon: Yolonda Kida, MD;  Location: [place] CV LAB;  Service: Cardiovascular;  Laterality: N/A;  ? ROBOTIC ASSISTED LAPAROSCOPIC SACROCOLPOPEXY Bilateral 07/30/2020  ? Procedure: XI ROBOTIC ASSISTED LAPAROSCOPIC SACROCOLPOPEXY AND SUPRACERVICAL HYSTERECTOMY WITH BILATERAL SALPINGO OOPHERECTOMY;  Surgeon: Ardis Hughs, MD;  Location: [facility] ORS;  Service: Urology;  Laterality: Bilateral;  REQUESTING 4 HRS  ? TONSILLECTOMY    ? ?Patient Active Problem List  ? Diagnosis Date Noted  ? Essential hypertension 01/23/2021  ? Cystocele with prolapse 07/30/2020  ? Postsurgical arthrodesis status 07/25/2020  ? Facet arthropathy 04/08/2020  ? Family history of breast cancer 11/16/2019  ? Body mass index (BMI) 25.0-25.9, adult 07/24/2019  ? Chronic UTI 12/06/2018  ? Status post lumbar surgery 12/06/2018  ? Spondylolisthesis, lumbar region 09/26/2018  ? Aortic atherosclerosis ([place]) 08/31/2018  ? Food sensitivity headache 03/29/2018  ? CAD (coronary artery disease) 02/23/2018  ? Advanced care planning/counseling discussion 03/28/2017  ? Varicose veins of both lower extremities with complications 00/93/8182  ? Arthritis 12/09/2016  ? Vaginal atrophy 09/10/2015  ? Vasomotor symptoms due to menopause 09/10/2015  ? Onychomycosis due to dermatophyte 04/07/2015  ? Bursitis of right shoulder 02/26/2015  ? Environmental and  seasonal allergies 02/26/2015  ? Other allergic rhinitis 02/26/2015  ? Osteoporosis 11/26/2014  ? COPD (chronic obstructive pulmonary disease) ([place]) 11/26/2014  ? Fuchs' corneal dystrophy 11/26/2014  ? Benign hypertensive renal disease 11/26/2014  ? Hyperlipidemia 11/26/2014  ? Chronic kidney disease, stage III (moderate) ([place]) 11/26/2014  ? Hypothyroidism 11/26/2014  ? Endothelial corneal dystrophy 11/26/2014  ? ? ? ?REFERRING DIAG: Chronic L low back pain ? ?THERAPY DIAG:  ?Chronic bilateral low back pain, unspecified whether sciatica present ? ?Muscle weakness (generalized) ? ?Difficulty in walking,  not elsewhere classified ? ?Radiculopathy, lumbar region ? ?PERTINENT HISTORY: Chronic low back pain. Had L4/5 fusion in her low back May 2020 which helped her sleep. Had PT following surgery which helped. Bowel and bladder are not perfect but improving. Had pelvic floor therapy which did not help. The bowel issues might have started after the back surgery. Feels like her urinary tract system quit due to the pain after back surgery. Was placed on a catheter for 6 months after the surgery. Denies saddle anesthesia. Pt also states having a kidney infection May 2022 and was diagnosed at the end of [place] 2022. Took all summer to get rid of the kidney infection which wiped her out and weakned her back.  ? ?PRECAUTIONS: No known precautions ? ?SUBJECTIVE: Back is about a 3/10 currently. About normal.  ? ?PAIN:  ?Are you having pain? 3/10 low back pain ? ? ?TODAY'S TREATMENT:  ?Objective  ?  ? ?Latex allergies ?  ?Therapeutic exercise ?   ?Bent over hip extension over table to promote glute max muscle activation ?            R 10x3 ?            L 10x3 ? ?Reclined position ?            Hooklying  ?                         ?                        Lower trunk rotation 10x3 each side ?                          ?                        Hip extension isometrics, leg straight to promote glute max muscle strengthening ?                                    R 10x5 seconds for 3 sets ?                                    L 10x5 seconds for 3 sets ?  ?                        Posterior pelvic tilt 10x5 seconds for 3 sets    ? ? Hip adduction isometrics folded pillow squeeze 10x5 seconds for 3 sets ? ? Knee to chest ?  R 10x5 seconds  ?  L 10x5 seconds  ? ? ?Improved exercise technique, movement at target joints, use of target muscles after  mod verbal, visual, tactile cues.  ?  ? ? ?Manual therapy ?Reclined STM R hip flexor muscles (iliopsoas) to decrease tension  ?             ?Reclined STM L hip flexor muscles to decrease tension   ? ? ? ? ?Response to treatment ? ?Fair tolerance to today's session.  ? ?  ?  ?Clinical impression ?Worked on improving hip extension, glute max and trunk strength to decrease pressure to low back. Fair tolerance to today's session. Pt will benefit from continued skilled physical therapy services to decrease pain, improve strength and function.  ?   ? ? ? ?PATIENT EDUCATION: ?Education details: there-ex, HEP ?Person educated: Patient ?Education method: Explanation, Demonstration, Tactile cues, Verbal cues, and Handouts ?Education comprehension: verbalized understanding and returned demonstration ? ? ?HOME EXERCISE PROGRAM: ?Access Code: [email or web address] ?URL: [email or web address] ?Date: 08/04/2021 ?Prepared by: Joneen Boers ? ?Exercises ?- Seated Transversus Abdominis Bracing  - 5 x daily - 7 x weekly - 3 sets - 10 reps - 5 seconds hold ?- Seated Trunk Rotation - Arms Crossed  - 1 x daily - 7 x weekly - 3 sets - 10 reps - 5 seconds  hold ?- Standing Gluteal Sets  - 1 x daily - 7 x weekly - 3 sets - 10 reps - 5 seconds hold ?- Supine Double Knee to Chest  - 1 x daily - 7 x weekly - 2 sets - 15 reps (Discontinued 08/06/2021 secondary to increased symptoms)  ?- Seated Sciatic Tensioner  - 1 x daily - 7 x weekly - 2 sets - 15 reps (Discontinued 3/303/2023 due to increased symptoms.) ?- Supine Posterior Pelvic Tilt  - 1 x daily - 7 x weekly - 3 sets - 10 reps - 5 seconds hold ?-Reclined Hip extension isometrics, leg straight to promote glute max muscle strengthening ?                                    R 10x5 seconds for 3 sets ?                                    L 10x5 seconds for 3 sets ? ? ? ? ? PT Short Term Goals - 08/11/21 1152   ? ?  ? PT SHORT TERM GOAL #1  ? Title Pt will be independent with her initial HEP to decrease pain, improve strength and ability to ambulate longer distances.   ? Baseline Pt has started her initial HEP (07/09/2021); Has been doing her HEP. Does not have questions (08/11/2021)   ? Time  3   ? Period Weeks   ? Status Achieved   ? Target Date 07/30/21   ? ?  ?  ? ?  ? ? ? PT Long Term Goals - 09/01/21 1558   ? ?  ? PT LONG TERM GOAL #1  ? Title Pt will have a decrease in low back pain to 4/

## 2021-09-22 ENCOUNTER — Ambulatory Visit: Payer: Medicare Other

## 2021-09-22 DIAGNOSIS — M6281 Muscle weakness (generalized): Secondary | ICD-10-CM

## 2021-09-22 DIAGNOSIS — M545 Low back pain, unspecified: Secondary | ICD-10-CM | POA: Diagnosis not present

## 2021-09-22 DIAGNOSIS — R262 Difficulty in walking, not elsewhere classified: Secondary | ICD-10-CM

## 2021-09-22 DIAGNOSIS — M5416 Radiculopathy, lumbar region: Secondary | ICD-10-CM

## 2021-09-22 NOTE — Therapy (Signed)
?OUTPATIENT PHYSICAL THERAPY TREATMENT NOTE ? ? ?Patient Name: Julia Deleon ?MRN: [id number] ?DOB:Mar 14, 1946, 76 y.o., female ?Today's Date: 09/22/2021 ? ?PCP: Charlynne Cousins, MD ?REFERRING PROVIDER: Diamond Nickel, DO ? ? PT End of Session - 09/22/21 1146   ? ? Visit Number 19   ? Number of Visits 33   ? Date for PT Re-Evaluation 10/29/21   ? Authorization Type 9   ? Authorization Time Period of 10 progress report   ? PT Start Time 1146   ? PT Stop Time 1230   ? PT Time Calculation (min) 44 min   ? Activity Tolerance Patient tolerated treatment well   ? Behavior During Therapy [facility] for tasks assessed/performed   ? ?  ?  ? ?  ? ? ? ? ? ? ? ? ? ? ? ? ?Past Medical History:  ?Diagnosis Date  ? Arthritis   ? Chronic kidney disease   ? COPD (chronic obstructive pulmonary disease) ([place])   ? pt denies   ? Dyspnea   ? Family history of adverse reaction to anesthesia   ? father was slow to wake up  ? Fuch's endothelial dystrophy   ? GERD (gastroesophageal reflux disease)   ? Hyperlipidemia   ? Hypertension   ? Hypothyroidism   ? Pneumonia   ? hx of   ? PONV (postoperative nausea and vomiting)   ? slow to wake up and PONV  ? ?Past Surgical History:  ?Procedure Laterality Date  ? ABDOMINAL HYSTERECTOMY    ? APPENDECTOMY    ? BACK SURGERY  09/26/2018  ? L4-5 PLIF by Dr. Arnoldo Morale  ? CARDIAC CATHETERIZATION    ? CHOLECYSTECTOMY    ? COLONOSCOPY WITH PROPOFOL N/A 10/23/2020  ? Procedure: COLONOSCOPY WITH PROPOFOL;  Surgeon: Virgel Manifold, MD;  Location: [facility] ENDOSCOPY;  Service: Endoscopy;  Laterality: N/A;  ? ESOPHAGOGASTRODUODENOSCOPY N/A 10/23/2020  ? Procedure: ESOPHAGOGASTRODUODENOSCOPY (EGD);  Surgeon: Virgel Manifold, MD;  Location: [facility] ENDOSCOPY;  Service: Endoscopy;  Laterality: N/A;  ? EYE SURGERY    ? FOOT SURGERY Right   ? pin removed left  ? laser vein surgery    ? NASAL SINUS SURGERY    ? RIGHT/LEFT HEART CATH AND CORONARY ANGIOGRAPHY N/A 12/06/2017  ? Procedure: RIGHT/LEFT HEART CATH AND CORONARY ANGIOGRAPHY;   Surgeon: Yolonda Kida, MD;  Location: [place] CV LAB;  Service: Cardiovascular;  Laterality: N/A;  ? ROBOTIC ASSISTED LAPAROSCOPIC SACROCOLPOPEXY Bilateral 07/30/2020  ? Procedure: XI ROBOTIC ASSISTED LAPAROSCOPIC SACROCOLPOPEXY AND SUPRACERVICAL HYSTERECTOMY WITH BILATERAL SALPINGO OOPHERECTOMY;  Surgeon: Ardis Hughs, MD;  Location: [facility] OR[facility];  Service: Urology;  Laterality: Bilateral;  REQUESTING 4 HRS  ? TONSILLECTOMY    ? ?Patient Active Problem List  ? Diagnosis Date Noted  ? Essential hypertension 01/23/2021  ? Cystocele with prolapse 07/30/2020  ? Postsurgical arthrodesis status 07/25/2020  ? Facet arthropathy 04/08/2020  ? Family history of breast cancer 11/16/2019  ? Body mass index (BMI) 25.0-25.9, adult 07/24/2019  ? Chronic UTI 12/06/2018  ? Status post lumbar surgery 12/06/2018  ? Spondylolisthesis, lumbar region 09/26/2018  ? Aortic atherosclerosis ([place]) 08/31/2018  ? Food sensitivity headache 03/29/2018  ? CAD (coronary artery disease) 02/23/2018  ? Advanced care planning/counseling discussion 03/28/2017  ? Varicose veins of both lower extremities with complications 48/18/5631  ? Arthritis 12/09/2016  ? Vaginal atrophy 09/10/2015  ? Vasomotor symptoms due to menopause 09/10/2015  ? Onychomycosis due to dermatophyte 04/07/2015  ? Bursitis of right shoulder 02/26/2015  ? Environmental  and seasonal allergies 02/26/2015  ? Other allergic rhinitis 02/26/2015  ? Osteoporosis 11/26/2014  ? COPD (chronic obstructive pulmonary disease) ([place]) 11/26/2014  ? Fuchs' corneal dystrophy 11/26/2014  ? Benign hypertensive renal disease 11/26/2014  ? Hyperlipidemia 11/26/2014  ? Chronic kidney disease, stage III (moderate) ([place]) 11/26/2014  ? Hypothyroidism 11/26/2014  ? Endothelial corneal dystrophy 11/26/2014  ? ? ? ?REFERRING DIAG: Chronic L low back pain ? ?THERAPY DIAG:  ?Chronic bilateral low back pain, unspecified whether sciatica present ? ?Muscle weakness (generalized) ? ?Difficulty in walking,  not elsewhere classified ? ?Radiculopathy, lumbar region ? ?PERTINENT HISTORY: Chronic low back pain. Had L4/5 fusion in her low back May 2020 which helped her sleep. Had PT following surgery which helped. Bowel and bladder are not perfect but improving. Had pelvic floor therapy which did not help. The bowel issues might have started after the back surgery. Feels like her urinary tract system quit due to the pain after back surgery. Was placed on a catheter for 6 months after the surgery. Denies saddle anesthesia. Pt also states having a kidney infection May 2022 and was diagnosed at the end of [place] 2022. Took all summer to get rid of the kidney infection which wiped her out and weakned her back.  ? ?PRECAUTIONS: No known precautions ? ?SUBJECTIVE: Back is doing pretty good, about a 3/10 currently. R leg is tight, been in the car. Its a working progress.  ? ?PAIN:  ?Are you having pain? 3/10 low back pain ? ? ?TODAY'S TREATMENT:  ?Objective  ?  ? ?Latex allergies ?  ? ? ?Therapeutic exercise ?   ?S/L hip flexor stretch with PT ? R 30 seconds x3 ? L 30 seconds x 3 ? ?S/L hip abduction with PT assist  ? R 5x2 (difficult) ? L 5x2  ? ?Hooklying  ?posterior pelvic tilt 10x5 seconds for 3 sets ? ?Straight leg hip flexion  ? R 8x ? L 10x ? ?Mini crunch 5x. Increased low back tightness ? ?Single knee to chest ?  R 10x5 seconds  ? ?L 10x5 seconds  ? ?Decreased LE tightness reported.  ? ? ? ?Improved exercise technique, movement at target joints, use of target muscles after mod verbal, visual, tactile cues.  ?  ? ? ?Manual therapy ? ?L S/L STM R piriformis muscle  ? ? ?Reclined STM R hip flexor and vastus lateralis muscles to decrease tension  ?             ? ? ?Response to treatment ? ?Fair tolerance to today's session. Decreased LE tightness with SKTC stretch  ? ?  ?  ?Clinical impression ? ?Continued working on trunk, and glute strength and improving hip extension to decrease pressure to low back. Decreased B LE tightness  after SKTC (single knee to chest) stretch to decrease extension pressure to low back. Pt will benefit from continued skilled physical therapy services to decrease pain, improve strength and function.  ?   ? ? ? ? ?PATIENT EDUCATION: ?Education details: there-ex, HEP ?Person educated: Patient ?Education method: Explanation, Demonstration, Tactile cues, Verbal cues, and Handouts ?Education comprehension: verbalized understanding and returned demonstration ? ? ?HOME EXERCISE PROGRAM: ?Access Code: [email or web address] ?URL: [email or web address] ?Date: 08/04/2021 ?Prepared by: Joneen Boers ? ?Exercises ?- Seated Transversus Abdominis Bracing  - 5 x daily - 7 x weekly - 3 sets - 10 reps - 5 seconds hold ?- Seated Trunk Rotation - Arms Crossed  - 1 x daily - 7 x weekly - 3 sets -  10 reps - 5 seconds  hold ?- Standing Gluteal Sets  - 1 x daily - 7 x weekly - 3 sets - 10 reps - 5 seconds hold ?- Supine Double Knee to Chest  - 1 x daily - 7 x weekly - 2 sets - 15 reps (Discontinued 08/06/2021 secondary to increased symptoms)  ?- Seated Sciatic Tensioner  - 1 x daily - 7 x weekly - 2 sets - 15 reps (Discontinued 3/303/2023 due to increased symptoms.) ?- Supine Posterior Pelvic Tilt  - 1 x daily - 7 x weekly - 3 sets - 10 reps - 5 seconds hold ?-Reclined Hip extension isometrics, leg straight to promote glute max muscle strengthening ?                                    R 10x5 seconds for 3 sets ?                                    L 10x5 seconds for 3 sets ?- Reclined Single Knee to Chest Stretch  - 1 x daily - 7 x weekly - 3 sets - 10 reps - 5 seconds hold ? ? ? ? PT Short Term Goals - 08/11/21 1152   ? ?  ? PT SHORT TERM GOAL #1  ? Title Pt will be independent with her initial HEP to decrease pain, improve strength and ability to ambulate longer distances.   ? Baseline Pt has started her initial HEP (07/09/2021); Has been doing her HEP. Does not have questions (08/11/2021)   ? Time 3   ? Period Weeks   ? Status Achieved   ?  Target Date 07/30/21   ? ?  ?  ? ?  ? ? ? PT Long Term Goals - 09/01/21 1558   ? ?  ? PT LONG TERM GOAL #1  ? Title Pt will have a decrease in low back pain to 4/10 or less at worst to promote abilit

## 2021-09-24 ENCOUNTER — Ambulatory Visit: Payer: Medicare Other

## 2021-09-24 DIAGNOSIS — M545 Low back pain, unspecified: Secondary | ICD-10-CM | POA: Diagnosis not present

## 2021-09-24 DIAGNOSIS — R262 Difficulty in walking, not elsewhere classified: Secondary | ICD-10-CM

## 2021-09-24 DIAGNOSIS — M5416 Radiculopathy, lumbar region: Secondary | ICD-10-CM

## 2021-09-24 DIAGNOSIS — M6281 Muscle weakness (generalized): Secondary | ICD-10-CM

## 2021-09-24 NOTE — Therapy (Signed)
OUTPATIENT PHYSICAL THERAPY TREATMENT NOTE And Progress Report (08/11/2021 - 09/24/2021)   Patient Name: Julia Deleon MRN: [id number] DOB:07-08-1945, 76 y.o., female Today's Date: 09/24/2021  PCP: Charlynne Cousins, MD REFERRING PROVIDER: Diamond Nickel, DO   PT End of Session - 09/24/21 1137     Visit Number 20    Number of Visits 33    Date for PT Re-Evaluation 10/29/21    Authorization Type 10    Authorization Time Period of 10 progress report    PT Start Time 1138    PT Stop Time 1224    PT Time Calculation (min) 46 min    Activity Tolerance Patient tolerated treatment well    Behavior During Therapy WFL for tasks assessed/performed                        Past Medical History:  Diagnosis Date   Arthritis    Chronic kidney disease    COPD (chronic obstructive pulmonary disease) ([place])    pt denies    Dyspnea    Family history of adverse reaction to anesthesia    father was slow to wake up   Fuch's endothelial dystrophy    GERD (gastroesophageal reflux disease)    Hyperlipidemia    Hypertension    Hypothyroidism    Pneumonia    hx of    PONV (postoperative nausea and vomiting)    slow to wake up and PONV   Past Surgical History:  Procedure Laterality Date   ABDOMINAL HYSTERECTOMY     APPENDECTOMY     BACK SURGERY  09/26/2018   L4-5 PLIF by Dr. Arnoldo Morale   CARDIAC CATHETERIZATION     CHOLECYSTECTOMY     COLONOSCOPY WITH PROPOFOL N/A 10/23/2020   Procedure: COLONOSCOPY WITH PROPOFOL;  Surgeon: Virgel Manifold, MD;  Location: [facility] ENDOSCOPY;  Service: Endoscopy;  Laterality: N/A;   ESOPHAGOGASTRODUODENOSCOPY N/A 10/23/2020   Procedure: ESOPHAGOGASTRODUODENOSCOPY (EGD);  Surgeon: Virgel Manifold, MD;  Location: [facility] ENDOSCOPY;  Service: Endoscopy;  Laterality: N/A;   EYE SURGERY     FOOT SURGERY Right    pin removed left   laser vein surgery     NASAL SINUS SURGERY     RIGHT/LEFT HEART CATH AND CORONARY ANGIOGRAPHY N/A 12/06/2017    Procedure: RIGHT/LEFT HEART CATH AND CORONARY ANGIOGRAPHY;  Surgeon: Yolonda Kida, MD;  Location: [place] [facility];  Service: Cardiovascular;  Laterality: N/A;   ROBOTIC ASSISTED LAPAROSCOPIC SACROCOLPOPEXY Bilateral 07/30/2020   Procedure: XI ROBOTIC ASSISTED LAPAROSCOPIC SACROCOLPOPEXY AND SUPRACERVICAL HYSTERECTOMY WITH BILATERAL SALPINGO OOPHERECTOMY;  Surgeon: Ardis Hughs, MD;  Location: [facility];  Service: Urology;  Laterality: Bilateral;  REQUESTING 4 HRS   TONSILLECTOMY     Patient Active Problem List   Diagnosis Date Noted   Essential hypertension 01/23/2021   Cystocele with prolapse 07/30/2020   Postsurgical arthrodesis status 07/25/2020   Facet arthropathy 04/08/2020   Family history of breast cancer 11/16/2019   Body mass index (BMI) 25.0-25.9, adult 07/24/2019   Chronic UTI 12/06/2018   Status post lumbar surgery 12/06/2018   Spondylolisthesis, lumbar region 09/26/2018   Aortic atherosclerosis ([place]) 08/31/2018   Food sensitivity headache 03/29/2018   CAD (coronary artery disease) 02/23/2018   Advanced care planning/counseling discussion 03/28/2017   Varicose veins of both lower extremities with complications 24/01/7352   Arthritis 12/09/2016   Vaginal atrophy 09/10/2015   Vasomotor symptoms due to menopause 09/10/2015   Onychomycosis due to dermatophyte 04/07/2015   Bursitis  of right shoulder 02/26/2015   Environmental and seasonal allergies 02/26/2015   Other allergic rhinitis 02/26/2015   Osteoporosis 11/26/2014   COPD (chronic obstructive pulmonary disease) ([place]) 11/26/2014   Fuchs' corneal dystrophy 11/26/2014   Benign hypertensive renal disease 11/26/2014   Hyperlipidemia 11/26/2014   Chronic kidney disease, stage III (moderate) ([place]) 11/26/2014   Hypothyroidism 11/26/2014   Endothelial corneal dystrophy 11/26/2014     REFERRING DIAG: Chronic L low back pain  THERAPY DIAG:  Chronic bilateral low back pain, unspecified whether sciatica  present  Muscle weakness (generalized)  Difficulty in walking, not elsewhere classified  Radiculopathy, lumbar region  PERTINENT HISTORY: Chronic low back pain. Had L4/5 fusion in her low back May 2020 which helped her sleep. Had PT following surgery which helped. Bowel and bladder are not perfect but improving. Had pelvic floor therapy which did not help. The bowel issues might have started after the back surgery. Feels like her urinary tract system quit due to the pain after back surgery. Was placed on a catheter for 6 months after the surgery. Denies saddle anesthesia. Pt also states having a kidney infection May 2022 and was diagnosed at the end of [place] 2022. Took all summer to get rid of the kidney infection which wiped her out and weakned her back.   PRECAUTIONS: No known precautions  SUBJECTIVE: Back is about the same. Today has tremendous tightness R (anterior lateral thigh) thigh, woke up with it this morning. Back feels a little weaker this morning. Can see a slight improvement. Did not use a back brace the day before yesterday to fix supper, yesterday though, she had to use the back brace. Usually uses her back brace to fix supper.    PAIN:  Are you having pain? 3/10 low back pain   TODAY'S TREATMENT:  Objective     Latex allergies     Therapeutic exercise     Seated manually resisted hip extension, abduction, ER 1x each way for each LE  Reviewed progress/current status with PT towards goals.    Reclined:  Hooklying  posterior pelvic tilt 10x10 seconds for 3 sets  Manually resisted leg press    R 10x   L 10x  Single knee to chest   R 10x10 seconds for 2 sets, then 10x5 seconds  L 10x10 seconds for 2 sets   Lower trunk rotation 10x3 each side   Seated upright posture  Gentle manually resisted trunk extension isometrics 10x5 seconds   Seated trunk flexion stretch 20 seconds. Increased B thigh pressure   Standing gentle lumbar extension 10x5 seconds for  3 sets  Decreased L and R thigh pressure   Improved exercise technique, movement at target joints, use of target muscles after mod verbal, visual, tactile cues.       Response to treatment Decreased B thigh pressure/tightness with gentle lumbar extension in standing today suggesting disc involvement.      Clinical impression Pt demonstrates some decrease in low back pain and improved overall B hip strength since initial evaluation. Functional scores seem to fluctuate. Overall, pt making slow progress with decreasing pain. Challenges to progress include age and chronicity of symptoms. Demonstrates gentle lumbar extension preference today with decreased B LE tightness afterwards. Pt will benefit from continued skilled physical therapy services to decrease pain, improve strength and function.         PATIENT EDUCATION: Education details: there-ex, HEP Person educated: Patient Education method: Explanation, Demonstration, Tactile cues, Verbal cues, and Handouts Education comprehension: verbalized  understanding and returned demonstration   HOME EXERCISE PROGRAM: Access Code: [email or web address] URL: [email or web address] Date: 08/04/2021 Prepared by: Joneen Boers  Exercises - Seated Transversus Abdominis Bracing  - 5 x daily - 7 x weekly - 3 sets - 10 reps - 5 seconds hold - Seated Trunk Rotation - Arms Crossed  - 1 x daily - 7 x weekly - 3 sets - 10 reps - 5 seconds  hold - Standing Gluteal Sets  - 1 x daily - 7 x weekly - 3 sets - 10 reps - 5 seconds hold - Supine Double Knee to Chest  - 1 x daily - 7 x weekly - 2 sets - 15 reps (Discontinued 08/06/2021 secondary to increased symptoms)  - Seated Sciatic Tensioner  - 1 x daily - 7 x weekly - 2 sets - 15 reps (Discontinued 3/303/2023 due to increased symptoms.) - Supine Posterior Pelvic Tilt  - 1 x daily - 7 x weekly - 3 sets - 10 reps - 5 seconds hold -Reclined Hip extension isometrics, leg straight to promote glute max muscle  strengthening                                     R 10x5 seconds for 3 sets                                     L 10x5 seconds for 3 sets - Reclined Single Knee to Chest Stretch  - 1 x daily - 7 x weekly - 3 sets - 10 reps - 5 seconds hold     PT Short Term Goals - 08/11/21 1152       PT SHORT TERM GOAL #1   Title Pt will be independent with her initial HEP to decrease pain, improve strength and ability to ambulate longer distances.    Baseline Pt has started her initial HEP (07/09/2021); Has been doing her HEP. Does not have questions (08/11/2021)    Time 3    Period Weeks    Status Achieved    Target Date 07/30/21              PT Long Term Goals - 09/24/21 1140       PT LONG TERM GOAL #1   Title Pt will have a decrease in low back pain to 4/10 or less at worst to promote ability to ambulate longer distances as well as perform standing tasks more comfortably.    Baseline 9/10 low back pain at most for the past 3 months. Pt also demonstrates B L5 radiating symptoms to knees (07/09/2021); 7-8/10 at most for the past 7 days, usually around 5 pm and later (08/11/2021); 7-8/10 at most for the past 7 days, usually in the evening. (09/01/2021); 7.5/10 back pain at most  for the past 7 days (09/24/2021)    Time 8    Period Weeks    Status Partially Met    Target Date 10/29/21      PT LONG TERM GOAL #2   Title Pt will improve B hip extension, abduction and ER strength by at least 1/2 MMT grade to promote ability to perform standing tasks with less back pain.    Baseline Hip extension 4+/5 R, 3+/5 L, hip abduction 4/5 R,  4-/5 L, hip ER 4-/5 R, 3+/5 L (07/09/2021); hip extensoin 4+/5  R, 5/5 L, hip abduction 4+/5 R and L, hip ER 4/5 R, 4/5 L (08/11/2021); hip extension 5/5 R and L, hip abduction 4+/5 R and L, hip ER 4/5 R and L (09/01/2021); hip extension 5/5 R and L, hip abduction 4+/5 R and L, hip ER 4/5 R and L, pt states L knee is the side that needs a knee replacement (09/24/2021)    Time 8     Period Weeks    Status Partially Met    Target Date 10/29/21      PT LONG TERM GOAL #3   Title Pt will improve her lumbar FOTO score by at least 10 points as a demonstration of improved function.    Baseline Lumbar Spine FOTO 44 (07/09/2021); Lumbar FOTO 40. Pt might have anwered her back function limitation question differently secondary to pt stating more limited in function but that was before starting PT. Might have answered increased limitation in function during today's session since starting PT instead which may have decrease her score. (08/11/2021).  47 (09/01/2021); 42 (09/24/2021)    Time 8    Period Weeks    Status On-going    Target Date 10/29/21              Plan - 09/24/21 1135     Clinical Impression Statement Pt demonstrates some decrease in low back pain and improved overall B hip strength since initial evaluation. Functional scores seem to fluctuate. Overall, pt making slow progress with decreasing pain. Challenges to progress include age and chronicity of symptoms. Demonstrates gentle lumbar extension preference today with decreased B LE tightness afterwards. Pt will benefit from continued skilled physical therapy services to decrease pain, improve strength and function.    Personal Factors and Comorbidities Age;Comorbidity 3+;Fitness;Past/Current Experience;Time since onset of injury/illness/exacerbation    Comorbidities B knee pain, arthritis, chronic kidney disease, COPD, dyspnea, HTN    Examination-Activity Limitations Squat;Lift;Stairs;Locomotion Level;Stand;Reach Overhead;Carry    Stability/Clinical Decision Making Stable/Uncomplicated    Clinical Decision Making Low    Rehab Potential Fair    PT Frequency 2x / week    PT Duration 8 weeks    PT Treatment/Interventions Therapeutic activities;Therapeutic exercise;Balance training;Neuromuscular re-education;Patient/family education;Manual techniques;Dry needling;Aquatic Therapy;Electrical Stimulation;Iontophoresis 44m/ml  Dexamethasone    PT Next Visit Plan Posture, trunk and hip strengthening, manual techniques, modalities PRN    PT Home Exercise Plan Medbridge Access Code [email or web address]    Consulted and Agree with Plan of Care Patient                        Thank you for your referral.   MJoneen BoersPT, DPT  09/24/2021, 12:54 PM

## 2021-09-29 ENCOUNTER — Ambulatory Visit: Payer: Medicare Other

## 2021-09-29 DIAGNOSIS — R262 Difficulty in walking, not elsewhere classified: Secondary | ICD-10-CM

## 2021-09-29 DIAGNOSIS — G8929 Other chronic pain: Secondary | ICD-10-CM

## 2021-09-29 DIAGNOSIS — M5416 Radiculopathy, lumbar region: Secondary | ICD-10-CM

## 2021-09-29 DIAGNOSIS — M6281 Muscle weakness (generalized): Secondary | ICD-10-CM

## 2021-09-29 DIAGNOSIS — M545 Low back pain, unspecified: Secondary | ICD-10-CM | POA: Diagnosis not present

## 2021-09-29 NOTE — Therapy (Signed)
OUTPATIENT PHYSICAL THERAPY TREATMENT NOTE    Patient Name: Julia Deleon MRN: [id number] DOB:Dec 22, 1945, 76 y.o., female Today's Date: 09/29/2021  PCP: Charlynne Cousins, MD REFERRING PROVIDER: Diamond Nickel, DO   PT End of Session - 09/29/21 1149     Visit Number 21    Number of Visits 33    Date for PT Re-Evaluation 10/29/21    Authorization Type 1    Authorization Time Period of 10 progress report    PT Start Time 1149    PT Stop Time 1232    PT Time Calculation (min) 43 min    Activity Tolerance Patient tolerated treatment well    Behavior During Therapy WFL for tasks assessed/performed                         Past Medical History:  Diagnosis Date   Arthritis    Chronic kidney disease    COPD (chronic obstructive pulmonary disease) ([place])    pt denies    Dyspnea    Family history of adverse reaction to anesthesia    father was slow to wake up   Fuch's endothelial dystrophy    GERD (gastroesophageal reflux disease)    Hyperlipidemia    Hypertension    Hypothyroidism    Pneumonia    hx of    PONV (postoperative nausea and vomiting)    slow to wake up and PONV   Past Surgical History:  Procedure Laterality Date   ABDOMINAL HYSTERECTOMY     APPENDECTOMY     BACK SURGERY  09/26/2018   L4-5 PLIF by Dr. Arnoldo Morale   CARDIAC CATHETERIZATION     CHOLECYSTECTOMY     COLONOSCOPY WITH PROPOFOL N/A 10/23/2020   Procedure: COLONOSCOPY WITH PROPOFOL;  Surgeon: Virgel Manifold, MD;  Location: [facility] ENDOSCOPY;  Service: Endoscopy;  Laterality: N/A;   ESOPHAGOGASTRODUODENOSCOPY N/A 10/23/2020   Procedure: ESOPHAGOGASTRODUODENOSCOPY (EGD);  Surgeon: Virgel Manifold, MD;  Location: [facility] ENDOSCOPY;  Service: Endoscopy;  Laterality: N/A;   EYE SURGERY     FOOT SURGERY Right    pin removed left   laser vein surgery     NASAL SINUS SURGERY     RIGHT/LEFT HEART CATH AND CORONARY ANGIOGRAPHY N/A 12/06/2017   Procedure: RIGHT/LEFT HEART CATH AND CORONARY  ANGIOGRAPHY;  Surgeon: Yolonda Kida, MD;  Location: [place] [facility];  Service: Cardiovascular;  Laterality: N/A;   ROBOTIC ASSISTED LAPAROSCOPIC SACROCOLPOPEXY Bilateral 07/30/2020   Procedure: XI ROBOTIC ASSISTED LAPAROSCOPIC SACROCOLPOPEXY AND SUPRACERVICAL HYSTERECTOMY WITH BILATERAL SALPINGO OOPHERECTOMY;  Surgeon: Ardis Hughs, MD;  Location: [facility];  Service: Urology;  Laterality: Bilateral;  REQUESTING 4 HRS   TONSILLECTOMY     Patient Active Problem List   Diagnosis Date Noted   Essential hypertension 01/23/2021   Cystocele with prolapse 07/30/2020   Postsurgical arthrodesis status 07/25/2020   Facet arthropathy 04/08/2020   Family history of breast cancer 11/16/2019   Body mass index (BMI) 25.0-25.9, adult 07/24/2019   Chronic UTI 12/06/2018   Status post lumbar surgery 12/06/2018   Spondylolisthesis, lumbar region 09/26/2018   Aortic atherosclerosis ([place]) 08/31/2018   Food sensitivity headache 03/29/2018   CAD (coronary artery disease) 02/23/2018   Advanced care planning/counseling discussion 03/28/2017   Varicose veins of both lower extremities with complications 80/16/5537   Arthritis 12/09/2016   Vaginal atrophy 09/10/2015   Vasomotor symptoms due to menopause 09/10/2015   Onychomycosis due to dermatophyte 04/07/2015   Bursitis of right shoulder 02/26/2015  Environmental and seasonal allergies 02/26/2015   Other allergic rhinitis 02/26/2015   Osteoporosis 11/26/2014   COPD (chronic obstructive pulmonary disease) ([place]) 11/26/2014   Fuchs' corneal dystrophy 11/26/2014   Benign hypertensive renal disease 11/26/2014   Hyperlipidemia 11/26/2014   Chronic kidney disease, stage III (moderate) ([place]) 11/26/2014   Hypothyroidism 11/26/2014   Endothelial corneal dystrophy 11/26/2014     REFERRING DIAG: Chronic L low back pain  THERAPY DIAG:  Chronic bilateral low back pain, unspecified whether sciatica present  Muscle weakness  (generalized)  Difficulty in walking, not elsewhere classified  Radiculopathy, lumbar region  PERTINENT HISTORY: Chronic low back pain. Had L4/5 fusion in her low back May 2020 which helped her sleep. Had PT following surgery which helped. Bowel and bladder are not perfect but improving. Had pelvic floor therapy which did not help. The bowel issues might have started after the back surgery. Feels like her urinary tract system quit due to the pain after back surgery. Was placed on a catheter for 6 months after the surgery. Denies saddle anesthesia. Pt also states having a kidney infection May 2022 and was diagnosed at the end of [place] 2022. Took all summer to get rid of the kidney infection which wiped her out and weakned her back.   PRECAUTIONS: No known precautions  SUBJECTIVE: Back is about like normal, about 2-3/10 currently. Still has the sciatica R LE. Went to the chiropractor yesterday but it did not help.     PAIN:  Are you having pain? 2-3/10 low back pain   TODAY'S TREATMENT:  Objective     Latex allergies     Therapeutic exercise    Standing gentle lumbar extension 10x5 seconds for 3 sets   Bent over onto elevated mat table  Hip extension    R 10x3   L 10x3  Seated manually resisted R lateral shift isometrics in neutral to improve posture 10x5 seconds for 3 sets  Seated manually resisted R upper trunk rotation isometrics in neutral to promote better lumbar posture 10x2 with 5 second holds  Seated manually resisted trunk flexion isometrics in neutral 5x5 second holds then 10x2 with 5 second holds     Improved exercise technique, movement at target joints, use of target muscles after mod verbal, visual, tactile cues.     Manual therapy Seated STM B lumbar paraspinal muscles to decrease muscle tension  Decreased R lateral thigh tightness.      Response to treatment Fair tolerance to today's session. Possible decreased R lateral thigh tightness with  treatment to decrease lumbar paraspinal muscle tension.    Clinical impression Decreased R LE tightness today with treatment to decrease B lumbar paraspinal muscle tension. No change in symptoms with standing gentle lumbar extension today. Fair tolerance to today's session. Pt will benefit from continued skilled physical therapy services to decrease pain, improve strength and function.         PATIENT EDUCATION: Education details: there-ex, HEP Person educated: Patient Education method: Explanation, Demonstration, Tactile cues, Verbal cues, and Handouts Education comprehension: verbalized understanding and returned demonstration   HOME EXERCISE PROGRAM: Access Code: [email or web address] URL: [email or web address] Date: 08/04/2021 Prepared by: Joneen Boers  Exercises - Seated Transversus Abdominis Bracing  - 5 x daily - 7 x weekly - 3 sets - 10 reps - 5 seconds hold - Seated Trunk Rotation - Arms Crossed  - 1 x daily - 7 x weekly - 3 sets - 10 reps - 5 seconds  hold - Standing Gluteal Sets  -  1 x daily - 7 x weekly - 3 sets - 10 reps - 5 seconds hold - Supine Double Knee to Chest  - 1 x daily - 7 x weekly - 2 sets - 15 reps (Discontinued 08/06/2021 secondary to increased symptoms)  - Seated Sciatic Tensioner  - 1 x daily - 7 x weekly - 2 sets - 15 reps (Discontinued 3/303/2023 due to increased symptoms.) - Supine Posterior Pelvic Tilt  - 1 x daily - 7 x weekly - 3 sets - 10 reps - 5 seconds hold -Reclined Hip extension isometrics, leg straight to promote glute max muscle strengthening                                     R 10x5 seconds for 3 sets                                     L 10x5 seconds for 3 sets - Reclined Single Knee to Chest Stretch  - 1 x daily - 7 x weekly - 3 sets - 10 reps - 5 seconds hold     PT Short Term Goals - 08/11/21 1152       PT SHORT TERM GOAL #1   Title Pt will be independent with her initial HEP to decrease pain, improve strength and ability to  ambulate longer distances.    Baseline Pt has started her initial HEP (07/09/2021); Has been doing her HEP. Does not have questions (08/11/2021)    Time 3    Period Weeks    Status Achieved    Target Date 07/30/21              PT Long Term Goals - 09/24/21 1140       PT LONG TERM GOAL #1   Title Pt will have a decrease in low back pain to 4/10 or less at worst to promote ability to ambulate longer distances as well as perform standing tasks more comfortably.    Baseline 9/10 low back pain at most for the past 3 months. Pt also demonstrates B L5 radiating symptoms to knees (07/09/2021); 7-8/10 at most for the past 7 days, usually around 5 pm and later (08/11/2021); 7-8/10 at most for the past 7 days, usually in the evening. (09/01/2021); 7.5/10 back pain at most  for the past 7 days (09/24/2021)    Time 8    Period Weeks    Status Partially Met    Target Date 10/29/21      PT LONG TERM GOAL #2   Title Pt will improve B hip extension, abduction and ER strength by at least 1/2 MMT grade to promote ability to perform standing tasks with less back pain.    Baseline Hip extension 4+/5 R, 3+/5 L, hip abduction 4/5 R,  4-/5 L, hip ER 4-/5 R, 3+/5 L (07/09/2021); hip extensoin 4+/5 R, 5/5 L, hip abduction 4+/5 R and L, hip ER 4/5 R, 4/5 L (08/11/2021); hip extension 5/5 R and L, hip abduction 4+/5 R and L, hip ER 4/5 R and L (09/01/2021); hip extension 5/5 R and L, hip abduction 4+/5 R and L, hip ER 4/5 R and L, pt states L knee is the side that needs a knee replacement (09/24/2021)    Time 8    Period Weeks  Status Partially Met    Target Date 10/29/21      PT LONG TERM GOAL #3   Title Pt will improve her lumbar FOTO score by at least 10 points as a demonstration of improved function.    Baseline Lumbar Spine FOTO 44 (07/09/2021); Lumbar FOTO 40. Pt might have anwered her back function limitation question differently secondary to pt stating more limited in function but that was before starting PT. Might  have answered increased limitation in function during today's session since starting PT instead which may have decrease her score. (08/11/2021).  47 (09/01/2021); 42 (09/24/2021)    Time 8    Period Weeks    Status On-going    Target Date 10/29/21              Plan - 09/29/21 1149     Clinical Impression Statement Decreased R LE tightness today with treatment to decrease B lumbar paraspinal muscle tension. No change in symptoms with standing gentle lumbar extension today. Fair tolerance to today's session. Pt will benefit from continued skilled physical therapy services to decrease pain, improve strength and function.    Personal Factors and Comorbidities Age;Comorbidity 3+;Fitness;Past/Current Experience;Time since onset of injury/illness/exacerbation    Comorbidities B knee pain, arthritis, chronic kidney disease, COPD, dyspnea, HTN    Examination-Activity Limitations Squat;Lift;Stairs;Locomotion Level;Stand;Reach Overhead;Carry    Stability/Clinical Decision Making Stable/Uncomplicated    Rehab Potential Fair    PT Frequency 2x / week    PT Duration 8 weeks    PT Treatment/Interventions Therapeutic activities;Therapeutic exercise;Balance training;Neuromuscular re-education;Patient/family education;Manual techniques;Dry needling;Aquatic Therapy;Electrical Stimulation;Iontophoresis 54m/ml Dexamethasone    PT Next Visit Plan Posture, trunk and hip strengthening, manual techniques, modalities PRN    PT Home Exercise Plan Medbridge Access Code [email or web address]    Consulted and Agree with Plan of Care Patient                      MJoneen BoersPT, DPT  09/29/2021, 12:53 PM

## 2021-10-01 ENCOUNTER — Ambulatory Visit: Payer: Medicare Other

## 2021-10-01 DIAGNOSIS — M62838 Other muscle spasm: Secondary | ICD-10-CM

## 2021-10-01 DIAGNOSIS — M6281 Muscle weakness (generalized): Secondary | ICD-10-CM

## 2021-10-01 DIAGNOSIS — M545 Low back pain, unspecified: Secondary | ICD-10-CM | POA: Diagnosis not present

## 2021-10-01 DIAGNOSIS — G8929 Other chronic pain: Secondary | ICD-10-CM

## 2021-10-01 DIAGNOSIS — M5416 Radiculopathy, lumbar region: Secondary | ICD-10-CM

## 2021-10-01 DIAGNOSIS — R262 Difficulty in walking, not elsewhere classified: Secondary | ICD-10-CM

## 2021-10-01 NOTE — Therapy (Signed)
OUTPATIENT PHYSICAL THERAPY TREATMENT NOTE    Patient Name: Julia Deleon MRN: [id number] DOB:1945/08/27, 76 y.o., female Today's Date: 10/01/2021  PCP: Charlynne Cousins, MD REFERRING PROVIDER: Charlynne Cousins, MD   PT End of Session - 10/01/21 1016     Visit Number 22    Number of Visits 33    Date for PT Re-Evaluation 10/29/21    Authorization Type 2    Authorization Time Period of 10 progress report    PT Start Time 1016    PT Stop Time 1058    PT Time Calculation (min) 42 min    Activity Tolerance Patient tolerated treatment well    Behavior During Therapy WFL for tasks assessed/performed                          Past Medical History:  Diagnosis Date   Arthritis    Chronic kidney disease    COPD (chronic obstructive pulmonary disease) ([place])    pt denies    Dyspnea    Family history of adverse reaction to anesthesia    father was slow to wake up   Fuch's endothelial dystrophy    GERD (gastroesophageal reflux disease)    Hyperlipidemia    Hypertension    Hypothyroidism    Pneumonia    hx of    PONV (postoperative nausea and vomiting)    slow to wake up and PONV   Past Surgical History:  Procedure Laterality Date   ABDOMINAL HYSTERECTOMY     APPENDECTOMY     BACK SURGERY  09/26/2018   L4-5 PLIF by Dr. Arnoldo Morale   CARDIAC CATHETERIZATION     CHOLECYSTECTOMY     COLONOSCOPY WITH PROPOFOL N/A 10/23/2020   Procedure: COLONOSCOPY WITH PROPOFOL;  Surgeon: Virgel Manifold, MD;  Location: [facility] ENDOSCOPY;  Service: Endoscopy;  Laterality: N/A;   ESOPHAGOGASTRODUODENOSCOPY N/A 10/23/2020   Procedure: ESOPHAGOGASTRODUODENOSCOPY (EGD);  Surgeon: Virgel Manifold, MD;  Location: [facility] ENDOSCOPY;  Service: Endoscopy;  Laterality: N/A;   EYE SURGERY     FOOT SURGERY Right    pin removed left   laser vein surgery     NASAL SINUS SURGERY     RIGHT/LEFT HEART CATH AND CORONARY ANGIOGRAPHY N/A 12/06/2017   Procedure: RIGHT/LEFT HEART CATH AND CORONARY  ANGIOGRAPHY;  Surgeon: Yolonda Kida, MD;  Location: [facility];  Service: Cardiovascular;  Laterality: N/A;   ROBOTIC ASSISTED LAPAROSCOPIC SACROCOLPOPEXY Bilateral 07/30/2020   Procedure: XI ROBOTIC ASSISTED LAPAROSCOPIC SACROCOLPOPEXY AND SUPRACERVICAL HYSTERECTOMY WITH BILATERAL SALPINGO OOPHERECTOMY;  Surgeon: Ardis Hughs, MD;  Location: [facility];  Service: Urology;  Laterality: Bilateral;  REQUESTING 4 HRS   TONSILLECTOMY     Patient Active Problem List   Diagnosis Date Noted   Essential hypertension 01/23/2021   Cystocele with prolapse 07/30/2020   Postsurgical arthrodesis status 07/25/2020   Facet arthropathy 04/08/2020   Family history of breast cancer 11/16/2019   Body mass index (BMI) 25.0-25.9, adult 07/24/2019   Chronic UTI 12/06/2018   Status post lumbar surgery 12/06/2018   Spondylolisthesis, lumbar region 09/26/2018   Aortic atherosclerosis ([place]) 08/31/2018   Food sensitivity headache 03/29/2018   CAD (coronary artery disease) 02/23/2018   Advanced care planning/counseling discussion 03/28/2017   Varicose veins of both lower extremities with complications 81/77/1165   Arthritis 12/09/2016   Vaginal atrophy 09/10/2015   Vasomotor symptoms due to menopause 09/10/2015   Onychomycosis due to dermatophyte 04/07/2015   Bursitis of right shoulder 02/26/2015  Environmental and seasonal allergies 02/26/2015   Other allergic rhinitis 02/26/2015   Osteoporosis 11/26/2014   COPD (chronic obstructive pulmonary disease) ([place]) 11/26/2014   Fuchs' corneal dystrophy 11/26/2014   Benign hypertensive renal disease 11/26/2014   Hyperlipidemia 11/26/2014   Chronic kidney disease, stage III (moderate) ([place]) 11/26/2014   Hypothyroidism 11/26/2014   Endothelial corneal dystrophy 11/26/2014     REFERRING DIAG: Chronic L low back pain  THERAPY DIAG:  Chronic bilateral low back pain, unspecified whether sciatica present  Muscle weakness  (generalized)  Difficulty in walking, not elsewhere classified  Radiculopathy, lumbar region  PERTINENT HISTORY: Chronic low back pain. Had L4/5 fusion in her low back May 2020 which helped her sleep. Had PT following surgery which helped. Bowel and bladder are not perfect but improving. Had pelvic floor therapy which did not help. The bowel issues might have started after the back surgery. Feels like her urinary tract system quit due to the pain after back surgery. Was placed on a catheter for 6 months after the surgery. Denies saddle anesthesia. Pt also states having a kidney infection May 2022 and was diagnosed at the end of [place] 2022. Took all summer to get rid of the kidney infection which wiped her out and weakned her back.   PRECAUTIONS: No known precautions  SUBJECTIVE: Did fantastic Tuesday. Woke up with 1/2 pain R LE... not that excruciation. Still has R LE tightness but no pain. A little tightness on L side.    PAIN:  Are you having pain? 2/10 low back pain.   TODAY'S TREATMENT:  Objective     Latex allergies     Therapeutic exercise    Standing gentle lumbar extension 10x5 seconds for 2 sets    Seated manually resisted R lateral shift isometrics in neutral to improve posture 10x5 seconds for 3 sets   Bent over onto elevated mat table  Hip extension    R 10x3   L 10x3  Seated manually resisted R upper trunk rotation isometrics in neutral to promote better lumbar posture 10x2 with 5 second holds  Seated manually resisted trunk flexion isometrics in neutral 10x2 with 5 second holds     Improved exercise technique, movement at target joints, use of target muscles after mod verbal, visual, tactile cues.     Manual therapy  Seated STM B lumbar paraspinal muscles to decrease muscle tension        Response to treatment Fair tolerance to today's session.    Clinical impression Continued with current treatment secondary to positive response from last  session based on subjective reports. Fair tolerance to today's session. Pt will benefit from continued skilled physical therapy services to decrease pain, improve strength and function.         PATIENT EDUCATION: Education details: there-ex, HEP Person educated: Patient Education method: Explanation, Demonstration, Tactile cues, Verbal cues, and Handouts Education comprehension: verbalized understanding and returned demonstration   HOME EXERCISE PROGRAM: Access Code: [email or web address] URL: [email or web address] Date: 08/04/2021 Prepared by: Joneen Boers  Exercises - Seated Transversus Abdominis Bracing  - 5 x daily - 7 x weekly - 3 sets - 10 reps - 5 seconds hold - Seated Trunk Rotation - Arms Crossed  - 1 x daily - 7 x weekly - 3 sets - 10 reps - 5 seconds  hold - Standing Gluteal Sets  - 1 x daily - 7 x weekly - 3 sets - 10 reps - 5 seconds hold - Supine Double Knee to Chest  - 1  x daily - 7 x weekly - 2 sets - 15 reps (Discontinued 08/06/2021 secondary to increased symptoms)  - Seated Sciatic Tensioner  - 1 x daily - 7 x weekly - 2 sets - 15 reps (Discontinued 3/303/2023 due to increased symptoms.) - Supine Posterior Pelvic Tilt  - 1 x daily - 7 x weekly - 3 sets - 10 reps - 5 seconds hold -Reclined Hip extension isometrics, leg straight to promote glute max muscle strengthening                                     R 10x5 seconds for 3 sets                                     L 10x5 seconds for 3 sets - Reclined Single Knee to Chest Stretch  - 1 x daily - 7 x weekly - 3 sets - 10 reps - 5 seconds hold     PT Short Term Goals - 08/11/21 1152       PT SHORT TERM GOAL #1   Title Pt will be independent with her initial HEP to decrease pain, improve strength and ability to ambulate longer distances.    Baseline Pt has started her initial HEP (07/09/2021); Has been doing her HEP. Does not have questions (08/11/2021)    Time 3    Period Weeks    Status Achieved    Target Date  07/30/21              PT Long Term Goals - 09/24/21 1140       PT LONG TERM GOAL #1   Title Pt will have a decrease in low back pain to 4/10 or less at worst to promote ability to ambulate longer distances as well as perform standing tasks more comfortably.    Baseline 9/10 low back pain at most for the past 3 months. Pt also demonstrates B L5 radiating symptoms to knees (07/09/2021); 7-8/10 at most for the past 7 days, usually around 5 pm and later (08/11/2021); 7-8/10 at most for the past 7 days, usually in the evening. (09/01/2021); 7.5/10 back pain at most  for the past 7 days (09/24/2021)    Time 8    Period Weeks    Status Partially Met    Target Date 10/29/21      PT LONG TERM GOAL #2   Title Pt will improve B hip extension, abduction and ER strength by at least 1/2 MMT grade to promote ability to perform standing tasks with less back pain.    Baseline Hip extension 4+/5 R, 3+/5 L, hip abduction 4/5 R,  4-/5 L, hip ER 4-/5 R, 3+/5 L (07/09/2021); hip extensoin 4+/5 R, 5/5 L, hip abduction 4+/5 R and L, hip ER 4/5 R, 4/5 L (08/11/2021); hip extension 5/5 R and L, hip abduction 4+/5 R and L, hip ER 4/5 R and L (09/01/2021); hip extension 5/5 R and L, hip abduction 4+/5 R and L, hip ER 4/5 R and L, pt states L knee is the side that needs a knee replacement (09/24/2021)    Time 8    Period Weeks    Status Partially Met    Target Date 10/29/21      PT LONG TERM GOAL #3   Title Pt will improve her  lumbar FOTO score by at least 10 points as a demonstration of improved function.    Baseline Lumbar Spine FOTO 44 (07/09/2021); Lumbar FOTO 40. Pt might have anwered her back function limitation question differently secondary to pt stating more limited in function but that was before starting PT. Might have answered increased limitation in function during today's session since starting PT instead which may have decrease her score. (08/11/2021).  47 (09/01/2021); 42 (09/24/2021)    Time 8    Period Weeks     Status On-going    Target Date 10/29/21              Plan - 10/01/21 1239     Clinical Impression Statement Continued with current treatment secondary to positive response from last session based on subjective reports. Fair tolerance to today's session. Pt will benefit from continued skilled physical therapy services to decrease pain, improve strength and function.    Personal Factors and Comorbidities Age;Comorbidity 3+;Fitness;Past/Current Experience;Time since onset of injury/illness/exacerbation    Comorbidities B knee pain, arthritis, chronic kidney disease, COPD, dyspnea, HTN    Examination-Activity Limitations Squat;Lift;Stairs;Locomotion Level;Stand;Reach Overhead;Carry    Stability/Clinical Decision Making Stable/Uncomplicated    Rehab Potential Fair    PT Frequency 2x / week    PT Duration 8 weeks    PT Treatment/Interventions Therapeutic activities;Therapeutic exercise;Balance training;Neuromuscular re-education;Patient/family education;Manual techniques;Dry needling;Aquatic Therapy;Electrical Stimulation;Iontophoresis 56m/ml Dexamethasone    PT Next Visit Plan Posture, trunk and hip strengthening, manual techniques, modalities PRN    PT Home Exercise Plan Medbridge Access Code [email or web address]    Consulted and Agree with Plan of Care Patient                       MJoneen BoersPT, DPT  10/01/2021, 12:41 PM

## 2021-10-08 ENCOUNTER — Ambulatory Visit: Payer: Medicare Other | Attending: Sports Medicine

## 2021-10-08 DIAGNOSIS — M6281 Muscle weakness (generalized): Secondary | ICD-10-CM | POA: Diagnosis present

## 2021-10-08 DIAGNOSIS — M5416 Radiculopathy, lumbar region: Secondary | ICD-10-CM | POA: Diagnosis present

## 2021-10-08 DIAGNOSIS — G8929 Other chronic pain: Secondary | ICD-10-CM | POA: Diagnosis present

## 2021-10-08 DIAGNOSIS — R262 Difficulty in walking, not elsewhere classified: Secondary | ICD-10-CM | POA: Diagnosis present

## 2021-10-08 DIAGNOSIS — M545 Low back pain, unspecified: Secondary | ICD-10-CM | POA: Insufficient documentation

## 2021-10-08 NOTE — Therapy (Signed)
OUTPATIENT PHYSICAL THERAPY TREATMENT NOTE    Patient Name: Julia Deleon MRN: [id number] DOB:[birth year], 76 y.o., female Today's Date: 10/08/2021  PCP: Charlynne Cousins, MD REFERRING PROVIDER: Diamond Nickel, DO   PT End of Session - 10/08/21 1016     Visit Number 23    Number of Visits 33    Date for PT Re-Evaluation 10/29/21    Authorization Type 3    Authorization Time Period of 10 progress report    PT Start Time 1016    PT Stop Time 1058    PT Time Calculation (min) 42 min    Activity Tolerance Patient tolerated treatment well    Behavior During Therapy WFL for tasks assessed/performed                           Past Medical History:  Diagnosis Date   Arthritis    Chronic kidney disease    COPD (chronic obstructive pulmonary disease) ([place])    pt denies    Dyspnea    Family history of adverse reaction to anesthesia    father was slow to wake up   Fuch's endothelial dystrophy    GERD (gastroesophageal reflux disease)    Hyperlipidemia    Hypertension    Hypothyroidism    Pneumonia    hx of    PONV (postoperative nausea and vomiting)    slow to wake up and PONV   Past Surgical History:  Procedure Laterality Date   ABDOMINAL HYSTERECTOMY     APPENDECTOMY     BACK SURGERY  09/26/2018   L4-5 PLIF by Dr. Arnoldo Morale   CARDIAC CATHETERIZATION     CHOLECYSTECTOMY     COLONOSCOPY WITH PROPOFOL N/A 10/23/2020   Procedure: COLONOSCOPY WITH PROPOFOL;  Surgeon: Virgel Manifold, MD;  Location: [facility] ENDOSCOPY;  Service: Endoscopy;  Laterality: N/A;   ESOPHAGOGASTRODUODENOSCOPY N/A 10/23/2020   Procedure: ESOPHAGOGASTRODUODENOSCOPY (EGD);  Surgeon: Virgel Manifold, MD;  Location: [facility] ENDOSCOPY;  Service: Endoscopy;  Laterality: N/A;   EYE SURGERY     FOOT SURGERY Right    pin removed left   laser vein surgery     NASAL SINUS SURGERY     RIGHT/LEFT HEART CATH AND CORONARY ANGIOGRAPHY N/A 12/06/2017   Procedure: RIGHT/LEFT HEART CATH AND CORONARY  ANGIOGRAPHY;  Surgeon: Yolonda Kida, MD;  Location: [place] [facility];  Service: Cardiovascular;  Laterality: N/A;   ROBOTIC ASSISTED LAPAROSCOPIC SACROCOLPOPEXY Bilateral 07/30/2020   Procedure: XI ROBOTIC ASSISTED LAPAROSCOPIC SACROCOLPOPEXY AND SUPRACERVICAL HYSTERECTOMY WITH BILATERAL SALPINGO OOPHERECTOMY;  Surgeon: Ardis Hughs, MD;  Location: [facility];  Service: Urology;  Laterality: Bilateral;  REQUESTING 4 HRS   TONSILLECTOMY     Patient Active Problem List   Diagnosis Date Noted   Essential hypertension 01/23/2021   Cystocele with prolapse 07/30/2020   Postsurgical arthrodesis status 07/25/2020   Facet arthropathy 04/08/2020   Family history of breast cancer 11/16/2019   Body mass index (BMI) 25.0-25.9, adult 07/24/2019   Chronic UTI 12/06/2018   Status post lumbar surgery 12/06/2018   Spondylolisthesis, lumbar region 09/26/2018   Aortic atherosclerosis ([place]) 08/31/2018   Food sensitivity headache 03/29/2018   CAD (coronary artery disease) 02/23/2018   Advanced care planning/counseling discussion 03/28/2017   Varicose veins of both lower extremities with complications 07/02/3610   Arthritis 12/09/2016   Vaginal atrophy 09/10/2015   Vasomotor symptoms due to menopause 09/10/2015   Onychomycosis due to dermatophyte 04/07/2015   Bursitis of right  shoulder 02/26/2015   Environmental and seasonal allergies 02/26/2015   Other allergic rhinitis 02/26/2015   Osteoporosis 11/26/2014   COPD (chronic obstructive pulmonary disease) ([place]) 11/26/2014   Fuchs' corneal dystrophy 11/26/2014   Benign hypertensive renal disease 11/26/2014   Hyperlipidemia 11/26/2014   Chronic kidney disease, stage III (moderate) ([place]) 11/26/2014   Hypothyroidism 11/26/2014   Endothelial corneal dystrophy 11/26/2014     REFERRING DIAG: Chronic L low back pain  THERAPY DIAG:  Chronic bilateral low back pain, unspecified whether sciatica present  Muscle weakness  (generalized)  Difficulty in walking, not elsewhere classified  Radiculopathy, lumbar region  PERTINENT HISTORY: Chronic low back pain. Had L4/5 fusion in her low back May 2020 which helped her sleep. Had PT following surgery which helped. Bowel and bladder are not perfect but improving. Had pelvic floor therapy which did not help. The bowel issues might have started after the back surgery. Feels like her urinary tract system quit due to the pain after back surgery. Was placed on a catheter for 6 months after the surgery. Denies saddle anesthesia. Pt also states having a kidney infection May 2022 and was diagnosed at the end of [place] 2022. Took all summer to get rid of the kidney infection which wiped her out and weakned her back.   PRECAUTIONS: No known precautions  SUBJECTIVE:  Back is about like normal, about 2-3/10... It's like that every day. A little tightness on the L LE, not the R. Thinks the last session was helpful.     PAIN:  Are you having pain? 2-3/10 low back pain.   TODAY'S TREATMENT:  Objective     Latex allergies     Therapeutic exercise    Seated gentle manually resisted trunk extension isometrics in neutral 10x5 seconds for 3 sets  Seated manually resisted trunk flexion isometrics in neutral  with 7x 5 second holds. R anterior lateral thigh discomfort.   Seated manually resisted R lateral shift isometrics in neutral to improve posture 10x5 seconds for 3 sets  Seated manually resisted R upper trunk rotation isometrics in neutral to promote better lumbar posture 10x2 with 5 second holds  Bent over onto elevated mat table  Hip extension    R 10x3   L 10x3  Standing glute max squeeze 10x10 seconds for 2 sests to promote hip extension and decrease stress to low back.    Improved exercise technique, movement at target joints, use of target muscles after mod verbal, visual, tactile cues.     Manual therapy  Seated STM B lumbar paraspinal muscles to decrease  muscle tension        Response to treatment Fair tolerance to today's session.    Clinical impression Continued working on improving trunk and glute strength as well as hip extension and decreasing lumbar paraspinal muscle tension to decrease stress to low back. Fair tolerance to today's session. Pt will benefit from continued skilled physical therapy services to decrease pain, improve strength and function.         PATIENT EDUCATION: Education details: there-ex, HEP Person educated: Patient Education method: Explanation, Demonstration, Tactile cues, Verbal cues, and Handouts Education comprehension: verbalized understanding and returned demonstration   HOME EXERCISE PROGRAM: Access Code: [email or web address] URL: [email or web address] Date: 08/04/2021 Prepared by: Joneen Boers  Exercises - Seated Transversus Abdominis Bracing  - 5 x daily - 7 x weekly - 3 sets - 10 reps - 5 seconds hold - Seated Trunk Rotation - Arms Crossed  - 1 x daily - 7  x weekly - 3 sets - 10 reps - 5 seconds  hold - Standing Gluteal Sets  - 1 x daily - 7 x weekly - 3 sets - 10 reps - 5 seconds hold - Supine Double Knee to Chest  - 1 x daily - 7 x weekly - 2 sets - 15 reps (Discontinued 08/06/2021 secondary to increased symptoms)  - Seated Sciatic Tensioner  - 1 x daily - 7 x weekly - 2 sets - 15 reps (Discontinued 3/303/2023 due to increased symptoms.) - Supine Posterior Pelvic Tilt  - 1 x daily - 7 x weekly - 3 sets - 10 reps - 5 seconds hold -Reclined Hip extension isometrics, leg straight to promote glute max muscle strengthening                                     R 10x5 seconds for 3 sets                                     L 10x5 seconds for 3 sets - Reclined Single Knee to Chest Stretch  - 1 x daily - 7 x weekly - 3 sets - 10 reps - 5 seconds hold     PT Short Term Goals - 08/11/21 1152       PT SHORT TERM GOAL #1   Title Pt will be independent with her initial HEP to decrease pain,  improve strength and ability to ambulate longer distances.    Baseline Pt has started her initial HEP (07/09/2021); Has been doing her HEP. Does not have questions (08/11/2021)    Time 3    Period Weeks    Status Achieved    Target Date 07/30/21              PT Long Term Goals - 09/24/21 1140       PT LONG TERM GOAL #1   Title Pt will have a decrease in low back pain to 4/10 or less at worst to promote ability to ambulate longer distances as well as perform standing tasks more comfortably.    Baseline 9/10 low back pain at most for the past 3 months. Pt also demonstrates B L5 radiating symptoms to knees (07/09/2021); 7-8/10 at most for the past 7 days, usually around 5 pm and later (08/11/2021); 7-8/10 at most for the past 7 days, usually in the evening. (09/01/2021); 7.5/10 back pain at most  for the past 7 days (09/24/2021)    Time 8    Period Weeks    Status Partially Met    Target Date 10/29/21      PT LONG TERM GOAL #2   Title Pt will improve B hip extension, abduction and ER strength by at least 1/2 MMT grade to promote ability to perform standing tasks with less back pain.    Baseline Hip extension 4+/5 R, 3+/5 L, hip abduction 4/5 R,  4-/5 L, hip ER 4-/5 R, 3+/5 L (07/09/2021); hip extensoin 4+/5 R, 5/5 L, hip abduction 4+/5 R and L, hip ER 4/5 R, 4/5 L (08/11/2021); hip extension 5/5 R and L, hip abduction 4+/5 R and L, hip ER 4/5 R and L (09/01/2021); hip extension 5/5 R and L, hip abduction 4+/5 R and L, hip ER 4/5 R and L, pt states L knee is the side  that needs a knee replacement (09/24/2021)    Time 8    Period Weeks    Status Partially Met    Target Date 10/29/21      PT LONG TERM GOAL #3   Title Pt will improve her lumbar FOTO score by at least 10 points as a demonstration of improved function.    Baseline Lumbar Spine FOTO 44 (07/09/2021); Lumbar FOTO 40. Pt might have anwered her back function limitation question differently secondary to pt stating more limited in function but that  was before starting PT. Might have answered increased limitation in function during today's session since starting PT instead which may have decrease her score. (08/11/2021).  47 (09/01/2021); 42 (09/24/2021)    Time 8    Period Weeks    Status On-going    Target Date 10/29/21              Plan - 10/08/21 1036     Clinical Impression Statement Continued working on improving trunk and glute strength as well as hip extension and decreasing lumbar paraspinal muscle tension to decrease stress to low back. Fair tolerance to today's session. Pt will benefit from continued skilled physical therapy services to decrease pain, improve strength and function.    Personal Factors and Comorbidities Age;Comorbidity 3+;Fitness;Past/Current Experience;Time since onset of injury/illness/exacerbation    Comorbidities B knee pain, arthritis, chronic kidney disease, COPD, dyspnea, HTN    Examination-Activity Limitations Squat;Lift;Stairs;Locomotion Level;Stand;Reach Overhead;Carry    Stability/Clinical Decision Making Stable/Uncomplicated    Rehab Potential Fair    PT Frequency 2x / week    PT Duration 8 weeks    PT Treatment/Interventions Therapeutic activities;Therapeutic exercise;Balance training;Neuromuscular re-education;Patient/family education;Manual techniques;Dry needling;Aquatic Therapy;Electrical Stimulation;Iontophoresis 3m/ml Dexamethasone    PT Next Visit Plan Posture, trunk and hip strengthening, manual techniques, modalities PRN    PT Home Exercise Plan Medbridge Access Code [email or web address]    Consulted and Agree with Plan of Care Patient                        MJoneen BoersPT, DPT  10/08/2021, 12:56 PM

## 2021-10-13 ENCOUNTER — Ambulatory Visit: Payer: Medicare Other

## 2021-10-13 DIAGNOSIS — M545 Low back pain, unspecified: Secondary | ICD-10-CM | POA: Diagnosis not present

## 2021-10-13 DIAGNOSIS — M6281 Muscle weakness (generalized): Secondary | ICD-10-CM

## 2021-10-13 DIAGNOSIS — M5416 Radiculopathy, lumbar region: Secondary | ICD-10-CM

## 2021-10-13 DIAGNOSIS — R262 Difficulty in walking, not elsewhere classified: Secondary | ICD-10-CM

## 2021-10-13 NOTE — Therapy (Signed)
OUTPATIENT PHYSICAL THERAPY TREATMENT NOTE    Patient Name: Faron C Diez MRN: [id number] DOB:1945-07-31, 76 y.o., female Today's Date: 10/13/2021  PCP: Charlynne Cousins, MD REFERRING PROVIDER: Diamond Nickel, DO   PT End of Session - 10/13/21 0854     Visit Number 24    Number of Visits 33    Date for PT Re-Evaluation 10/29/21    Authorization Type 3    Authorization Time Period of 10 progress report    PT Start Time 0854    PT Stop Time 0933    PT Time Calculation (min) 39 min    Activity Tolerance Patient tolerated treatment well    Behavior During Therapy WFL for tasks assessed/performed                           Past Medical History:  Diagnosis Date   Arthritis    Chronic kidney disease    COPD (chronic obstructive pulmonary disease) ([place])    pt denies    Dyspnea    Family history of adverse reaction to anesthesia    father was slow to wake up   Fuch's endothelial dystrophy    GERD (gastroesophageal reflux disease)    Hyperlipidemia    Hypertension    Hypothyroidism    Pneumonia    hx of    PONV (postoperative nausea and vomiting)    slow to wake up and PONV   Past Surgical History:  Procedure Laterality Date   ABDOMINAL HYSTERECTOMY     APPENDECTOMY     BACK SURGERY  09/26/2018   L4-5 PLIF by Dr. Arnoldo Morale   CARDIAC CATHETERIZATION     CHOLECYSTECTOMY     COLONOSCOPY WITH PROPOFOL N/A 10/23/2020   Procedure: COLONOSCOPY WITH PROPOFOL;  Surgeon: Virgel Manifold, MD;  Location: [facility] ENDOSCOPY;  Service: Endoscopy;  Laterality: N/A;   ESOPHAGOGASTRODUODENOSCOPY N/A 10/23/2020   Procedure: ESOPHAGOGASTRODUODENOSCOPY (EGD);  Surgeon: Virgel Manifold, MD;  Location: [facility] ENDOSCOPY;  Service: Endoscopy;  Laterality: N/A;   EYE SURGERY     FOOT SURGERY Right    pin removed left   laser vein surgery     NASAL SINUS SURGERY     RIGHT/LEFT HEART CATH AND CORONARY ANGIOGRAPHY N/A 12/06/2017   Procedure: RIGHT/LEFT HEART CATH AND CORONARY  ANGIOGRAPHY;  Surgeon: Yolonda Kida, MD;  Location: [facility];  Service: Cardiovascular;  Laterality: N/A;   ROBOTIC ASSISTED LAPAROSCOPIC SACROCOLPOPEXY Bilateral 07/30/2020   Procedure: XI ROBOTIC ASSISTED LAPAROSCOPIC SACROCOLPOPEXY AND SUPRACERVICAL HYSTERECTOMY WITH BILATERAL SALPINGO OOPHERECTOMY;  Surgeon: Ardis Hughs, MD;  Location: [facility];  Service: Urology;  Laterality: Bilateral;  REQUESTING 4 HRS   TONSILLECTOMY     Patient Active Problem List   Diagnosis Date Noted   Essential hypertension 01/23/2021   Cystocele with prolapse 07/30/2020   Postsurgical arthrodesis status 07/25/2020   Facet arthropathy 04/08/2020   Family history of breast cancer 11/16/2019   Body mass index (BMI) 25.0-25.9, adult 07/24/2019   Chronic UTI 12/06/2018   Status post lumbar surgery 12/06/2018   Spondylolisthesis, lumbar region 09/26/2018   Aortic atherosclerosis ([place]) 08/31/2018   Food sensitivity headache 03/29/2018   CAD (coronary artery disease) 02/23/2018   Advanced care planning/counseling discussion 03/28/2017   Varicose veins of both lower extremities with complications 25/42/7062   Arthritis 12/09/2016   Vaginal atrophy 09/10/2015   Vasomotor symptoms due to menopause 09/10/2015   Onychomycosis due to dermatophyte 04/07/2015   Bursitis of right  shoulder 02/26/2015   Environmental and seasonal allergies 02/26/2015   Other allergic rhinitis 02/26/2015   Osteoporosis 11/26/2014   COPD (chronic obstructive pulmonary disease) ([place]) 11/26/2014   Fuchs' corneal dystrophy 11/26/2014   Benign hypertensive renal disease 11/26/2014   Hyperlipidemia 11/26/2014   Chronic kidney disease, stage III (moderate) ([place]) 11/26/2014   Hypothyroidism 11/26/2014   Endothelial corneal dystrophy 11/26/2014     REFERRING DIAG: Chronic L low back pain  THERAPY DIAG:  Chronic bilateral low back pain, unspecified whether sciatica present  Muscle weakness  (generalized)  Difficulty in walking, not elsewhere classified  Radiculopathy, lumbar region  PERTINENT HISTORY: Chronic low back pain. Had L4/5 fusion in her low back May 2020 which helped her sleep. Had PT following surgery which helped. Bowel and bladder are not perfect but improving. Had pelvic floor therapy which did not help. The bowel issues might have started after the back surgery. Feels like her urinary tract system quit due to the pain after back surgery. Was placed on a catheter for 6 months after the surgery. Denies saddle anesthesia. Pt also states having a kidney infection May 2022 and was diagnosed at the end of [place] 2022. Took all summer to get rid of the kidney infection which wiped her out and weakned her back.   PRECAUTIONS: No known precautions  SUBJECTIVE:  Back is doing pretty good. Everythign considering, its probably about a 2/10. Spent about 5 hours in the car Sunday and 2 hours in the car yesterday. No B LE tightness right this minute.    PAIN:  Are you having pain? 2/10 low back pain.   TODAY'S TREATMENT:  Objective     Latex allergies     Therapeutic exercise    Seated manually resisted R lateral shift isometrics in neutral to improve posture 10x5 seconds for 3 sets   Seated manually resisted R upper trunk rotation isometrics in neutral to promote better lumbar posture 10x3 with 5 second holds  Seated transversus abdominis contraction 10x5 seconds for 3 sets   Seated hip extension isometrics   R 10x5 seconds for 2 sets  L 10x5 seconds for 2 sets  R lumbar side bend posture at T12/L1 area  Seated L trunk side bend with PT manual L to R pressure to T12/L1 area to improve posture 10x5 seconds for 2 sets     Improved exercise technique, movement at target joints, use of target muscles after mod verbal, visual, tactile cues.     Manual therapy Seated STM B thoraco lumbar paraspinal R >L  muscles to decrease muscle tension        Response  to treatment Fair tolerance to today's session.    Clinical impression  Continued working on improving posture, trunk and glute strength as well as hip extension and decreasing lumbar paraspinal muscle tension to decrease stress to low back. Fair tolerance to today's session. Pt will benefit from continued skilled physical therapy services to decrease pain, improve strength and function.         PATIENT EDUCATION: Education details: there-ex, HEP Person educated: Patient Education method: Explanation, Demonstration, Tactile cues, Verbal cues, and Handouts Education comprehension: verbalized understanding and returned demonstration   HOME EXERCISE PROGRAM: Access Code: [email or web address] URL: [email or web address] Date: 08/04/2021 Prepared by: Joneen Boers  Exercises - Seated Transversus Abdominis Bracing  - 5 x daily - 7 x weekly - 3 sets - 10 reps - 5 seconds hold - Seated Trunk Rotation - Arms Crossed  - 1 x daily -  7 x weekly - 3 sets - 10 reps - 5 seconds  hold - Standing Gluteal Sets  - 1 x daily - 7 x weekly - 3 sets - 10 reps - 5 seconds hold - Supine Double Knee to Chest  - 1 x daily - 7 x weekly - 2 sets - 15 reps (Discontinued 08/06/2021 secondary to increased symptoms)  - Seated Sciatic Tensioner  - 1 x daily - 7 x weekly - 2 sets - 15 reps (Discontinued 3/303/2023 due to increased symptoms.) - Supine Posterior Pelvic Tilt  - 1 x daily - 7 x weekly - 3 sets - 10 reps - 5 seconds hold -Reclined Hip extension isometrics, leg straight to promote glute max muscle strengthening                                     R 10x5 seconds for 3 sets                                     L 10x5 seconds for 3 sets - Reclined Single Knee to Chest Stretch  - 1 x daily - 7 x weekly - 3 sets - 10 reps - 5 seconds hold     PT Short Term Goals - 08/11/21 1152       PT SHORT TERM GOAL #1   Title Pt will be independent with her initial HEP to decrease pain, improve strength and ability  to ambulate longer distances.    Baseline Pt has started her initial HEP (07/09/2021); Has been doing her HEP. Does not have questions (08/11/2021)    Time 3    Period Weeks    Status Achieved    Target Date 07/30/21              PT Long Term Goals - 09/24/21 1140       PT LONG TERM GOAL #1   Title Pt will have a decrease in low back pain to 4/10 or less at worst to promote ability to ambulate longer distances as well as perform standing tasks more comfortably.    Baseline 9/10 low back pain at most for the past 3 months. Pt also demonstrates B L5 radiating symptoms to knees (07/09/2021); 7-8/10 at most for the past 7 days, usually around 5 pm and later (08/11/2021); 7-8/10 at most for the past 7 days, usually in the evening. (09/01/2021); 7.5/10 back pain at most  for the past 7 days (09/24/2021)    Time 8    Period Weeks    Status Partially Met    Target Date 10/29/21      PT LONG TERM GOAL #2   Title Pt will improve B hip extension, abduction and ER strength by at least 1/2 MMT grade to promote ability to perform standing tasks with less back pain.    Baseline Hip extension 4+/5 R, 3+/5 L, hip abduction 4/5 R,  4-/5 L, hip ER 4-/5 R, 3+/5 L (07/09/2021); hip extensoin 4+/5 R, 5/5 L, hip abduction 4+/5 R and L, hip ER 4/5 R, 4/5 L (08/11/2021); hip extension 5/5 R and L, hip abduction 4+/5 R and L, hip ER 4/5 R and L (09/01/2021); hip extension 5/5 R and L, hip abduction 4+/5 R and L, hip ER 4/5 R and L, pt states L knee is the  side that needs a knee replacement (09/24/2021)    Time 8    Period Weeks    Status Partially Met    Target Date 10/29/21      PT LONG TERM GOAL #3   Title Pt will improve her lumbar FOTO score by at least 10 points as a demonstration of improved function.    Baseline Lumbar Spine FOTO 44 (07/09/2021); Lumbar FOTO 40. Pt might have anwered her back function limitation question differently secondary to pt stating more limited in function but that was before starting PT. Might  have answered increased limitation in function during today's session since starting PT instead which may have decrease her score. (08/11/2021).  47 (09/01/2021); 42 (09/24/2021)    Time 8    Period Weeks    Status On-going    Target Date 10/29/21              Plan - 10/13/21 0858     Clinical Impression Statement Continued working on improving posture, trunk and glute strength as well as hip extension and decreasing lumbar paraspinal muscle tension to decrease stress to low back. Fair tolerance to today's session. Pt will benefit from continued skilled physical therapy services to decrease pain, improve strength and function.    Personal Factors and Comorbidities Age;Comorbidity 3+;Fitness;Past/Current Experience;Time since onset of injury/illness/exacerbation    Comorbidities B knee pain, arthritis, chronic kidney disease, COPD, dyspnea, HTN    Examination-Activity Limitations Squat;Lift;Stairs;Locomotion Level;Stand;Reach Overhead;Carry    Stability/Clinical Decision Making Stable/Uncomplicated    Rehab Potential Fair    PT Frequency 2x / week    PT Duration 8 weeks    PT Treatment/Interventions Therapeutic activities;Therapeutic exercise;Balance training;Neuromuscular re-education;Patient/family education;Manual techniques;Dry needling;Aquatic Therapy;Electrical Stimulation;Iontophoresis 54m/ml Dexamethasone    PT Next Visit Plan Posture, trunk and hip strengthening, manual techniques, modalities PRN    PT Home Exercise Plan Medbridge Access Code [email or web address]    Consulted and Agree with Plan of Care Patient                         MJoneen BoersPT, DPT  10/13/2021, 1:36 PM

## 2021-10-15 ENCOUNTER — Ambulatory Visit: Payer: Medicare Other

## 2021-10-15 DIAGNOSIS — M545 Low back pain, unspecified: Secondary | ICD-10-CM | POA: Diagnosis not present

## 2021-10-15 DIAGNOSIS — R262 Difficulty in walking, not elsewhere classified: Secondary | ICD-10-CM

## 2021-10-15 DIAGNOSIS — M6281 Muscle weakness (generalized): Secondary | ICD-10-CM

## 2021-10-15 DIAGNOSIS — M5416 Radiculopathy, lumbar region: Secondary | ICD-10-CM

## 2021-10-15 NOTE — Therapy (Signed)
OUTPATIENT PHYSICAL THERAPY TREATMENT NOTE    Patient Name: Julia Deleon MRN: [id number] DOB:01/17/1946, 76 y.o., female Today's Date: 10/15/2021  PCP: [name], Avanti, MD REFERRING PROVIDER: Kubinski, Andrew J, DO   PT End of Session - 10/15/21 0930     Visit Number 25    Number of Visits 33    Date for PT Re-Evaluation 10/29/21    Authorization Type 5    Authorization Time Period of 10 progress report    PT Start Time 0931    PT Stop Time 1014    PT Time Calculation (min) 43 min    Activity Tolerance Patient tolerated treatment well    Behavior During Therapy WFL for tasks assessed/performed                            Past Medical History:  Diagnosis Date   Arthritis    Chronic kidney disease    COPD (chronic obstructive pulmonary disease) ([facility])    pt denies    Dyspnea    Family history of adverse reaction to anesthesia    father was slow to wake up   Fuch's endothelial dystrophy    GERD (gastroesophageal reflux disease)    Hyperlipidemia    Hypertension    Hypothyroidism    Pneumonia    hx of    PONV (postoperative nausea and vomiting)    slow to wake up and PONV   Past Surgical History:  Procedure Laterality Date   ABDOMINAL HYSTERECTOMY     APPENDECTOMY     BACK SURGERY  09/26/2018   L4-5 PLIF by Dr. Jenkins   CARDIAC CATHETERIZATION     CHOLECYSTECTOMY     COLONOSCOPY WITH PROPOFOL N/A 10/23/2020   Procedure: COLONOSCOPY WITH PROPOFOL;  Surgeon: Tahiliani, Varnita B, MD;  Location: [facility] ENDOSCOPY;  Service: Endoscopy;  Laterality: N/A;   ESOPHAGOGASTRODUODENOSCOPY N/A 10/23/2020   Procedure: ESOPHAGOGASTRODUODENOSCOPY (EGD);  Surgeon: Tahiliani, Varnita B, MD;  Location: [facility] END[facility];  Service: Endoscopy;  Laterality: N/A;   EYE SURGERY     FOOT SURGERY Right    pin removed left   laser vein surgery     NASAL SINUS SURGERY     RIGHT/LEFT HEART CATH AND CORONARY ANGIOGRAPHY N/A 12/06/2017   Procedure: RIGHT/LEFT HEART CATH AND CORONARY  ANGIOGRAPHY;  Surgeon: Callwood, Dwayne D, MD;  Location: [facility];  Service: Cardiovascular;  Laterality: N/A;   ROBOTIC ASSISTED LAPAROSCOPIC SACROCOLPOPEXY Bilateral 07/30/2020   Procedure: XI ROBOTIC ASSISTED LAPAROSCOPIC SACROCOLPOPEXY AND SUPRACERVICAL HYSTERECTOMY WITH BILATERAL SALPINGO OOPHERECTOMY;  Surgeon: Herrick, Benjamin W, MD;  Location: [facility];  Service: Urology;  Laterality: Bilateral;  REQUESTING 4 HRS   TONSILLECTOMY     Patient Active Problem List   Diagnosis Date Noted   Essential hypertension 01/23/2021   Cystocele with prolapse 07/30/2020   Postsurgical arthrodesis status 07/25/2020   Facet arthropathy 04/08/2020   Family history of breast cancer 11/16/2019   Body mass index (BMI) 25.0-25.9, adult 07/24/2019   Chronic UTI 12/06/2018   Status post lumbar surgery 12/06/2018   Spondylolisthesis, lumbar region 09/26/2018   Aortic atherosclerosis ([facility]) 08/31/2018   Food sensitivity headache 03/29/2018   CAD (coronary artery disease) 02/23/2018   Advanced care planning/counseling discussion 03/28/2017   Varicose veins of both lower extremities with complications 01/04/2017   Arthritis 12/09/2016   Vaginal atrophy 09/10/2015   Vasomotor symptoms due to menopause 09/10/2015   Onychomycosis due to dermatophyte 04/07/2015   Bursitis of   right shoulder 02/26/2015   Environmental and seasonal allergies 02/26/2015   Other allergic rhinitis 02/26/2015   Osteoporosis 11/26/2014   COPD (chronic obstructive pulmonary disease) ([place]) 11/26/2014   Fuchs' corneal dystrophy 11/26/2014   Benign hypertensive renal disease 11/26/2014   Hyperlipidemia 11/26/2014   Chronic kidney disease, stage III (moderate) ([place]) 11/26/2014   Hypothyroidism 11/26/2014   Endothelial corneal dystrophy 11/26/2014     REFERRING DIAG: Chronic L low back pain  THERAPY DIAG:  Chronic bilateral low back pain, unspecified whether sciatica present  Muscle weakness  (generalized)  Difficulty in walking, not elsewhere classified  Radiculopathy, lumbar region  PERTINENT HISTORY: Chronic low back pain. Had L4/5 fusion in her low back May 2020 which helped her sleep. Had PT following surgery which helped. Bowel and bladder are not perfect but improving. Had pelvic floor therapy which did not help. The bowel issues might have started after the back surgery. Feels like her urinary tract system quit due to the pain after back surgery. Was placed on a catheter for 6 months after the surgery. Denies saddle anesthesia. Pt also states having a kidney infection May 2022 and was diagnosed at the end of [place] 2022. Took all summer to get rid of the kidney infection which wiped her out and weakned her back.   PRECAUTIONS: No known precautions  SUBJECTIVE:  Back is doing pretty good. Just a little tight on the R LE. Went to the chiropractor yesterday. Might have helped a little bit until she did stuff at home. Not standing straight anymore.      PAIN:  Are you having pain? 2-3/10 low back stiffness   TODAY'S TREATMENT:  Objective     Latex allergies     Therapeutic exercise    Standing glute max squeeze 10x2 with 5 second holds   Standing L trunk Side bend 10x10 sec  Seated R and L trunk rotation 10x3 each  Seated B scapular retraction 10x3 with 5 second holds  Bent over onto elevated mat table             Hip extension                          R 10x3                         L 10x3   Transversus abdominis contraction 10x5 seconds    Then with glute max squeeze 10x5 seconds for 2 sets     More neutral lumbar spine observed afterwards.    Improved exercise technique, movement at target joints, use of target muscles after mod verbal, visual, tactile cues.     Manual therapy Seated STM B thoraco lumbar paraspinal R >L  muscles to decrease muscle tension        Response to treatment Decreased B LE tightness after session.    Clinical  impression Worked on impriving posture, trunk and glute strength and decreasing B lumbar muscle tension to decrease stress to lumbar spine. Decreased B LE tightness reported aftrer session. Pt will benefit from continued skilled physical therapy services to decrease pain, improve strength and function.         PATIENT EDUCATION: Education details: there-ex, HEP Person educated: Patient Education method: Explanation, Demonstration, Tactile cues, Verbal cues, and Handouts Education comprehension: verbalized understanding and returned demonstration   HOME EXERCISE PROGRAM: Access Code: [email or web address] URL: [email or web address] Date: 08/04/2021 Prepared by: Joneen Boers  Exercises -  Seated Transversus Abdominis Bracing  - 5 x daily - 7 x weekly - 3 sets - 10 reps - 5 seconds hold - Seated Trunk Rotation - Arms Crossed  - 1 x daily - 7 x weekly - 3 sets - 10 reps - 5 seconds  hold - Standing Gluteal Sets  - 1 x daily - 7 x weekly - 3 sets - 10 reps - 5 seconds hold - Supine Double Knee to Chest  - 1 x daily - 7 x weekly - 2 sets - 15 reps (Discontinued 08/06/2021 secondary to increased symptoms)  - Seated Sciatic Tensioner  - 1 x daily - 7 x weekly - 2 sets - 15 reps (Discontinued 3/303/2023 due to increased symptoms.) - Supine Posterior Pelvic Tilt  - 1 x daily - 7 x weekly - 3 sets - 10 reps - 5 seconds hold -Reclined Hip extension isometrics, leg straight to promote glute max muscle strengthening                                     R 10x5 seconds for 3 sets                                     L 10x5 seconds for 3 sets - Reclined Single Knee to Chest Stretch  - 1 x daily - 7 x weekly - 3 sets - 10 reps - 5 seconds hold     PT Short Term Goals - 08/11/21 1152       PT SHORT TERM GOAL #1   Title Pt will be independent with her initial HEP to decrease pain, improve strength and ability to ambulate longer distances.    Baseline Pt has started her initial HEP (07/09/2021); Has  been doing her HEP. Does not have questions (08/11/2021)    Time 3    Period Weeks    Status Achieved    Target Date 07/30/21              PT Long Term Goals - 09/24/21 1140       PT LONG TERM GOAL #1   Title Pt will have a decrease in low back pain to 4/10 or less at worst to promote ability to ambulate longer distances as well as perform standing tasks more comfortably.    Baseline 9/10 low back pain at most for the past 3 months. Pt also demonstrates B L5 radiating symptoms to knees (07/09/2021); 7-8/10 at most for the past 7 days, usually around 5 pm and later (08/11/2021); 7-8/10 at most for the past 7 days, usually in the evening. (09/01/2021); 7.5/10 back pain at most  for the past 7 days (09/24/2021)    Time 8    Period Weeks    Status Partially Met    Target Date 10/29/21      PT LONG TERM GOAL #2   Title Pt will improve B hip extension, abduction and ER strength by at least 1/2 MMT grade to promote ability to perform standing tasks with less back pain.    Baseline Hip extension 4+/5 R, 3+/5 L, hip abduction 4/5 R,  4-/5 L, hip ER 4-/5 R, 3+/5 L (07/09/2021); hip extensoin 4+/5 R, 5/5 L, hip abduction 4+/5 R and L, hip ER 4/5 R, 4/5 L (08/11/2021); hip extension 5/5 R and L, hip  abduction 4+/5 R and L, hip ER 4/5 R and L (09/01/2021); hip extension 5/5 R and L, hip abduction 4+/5 R and L, hip ER 4/5 R and L, pt states L knee is the side that needs a knee replacement (09/24/2021)    Time 8    Period Weeks    Status Partially Met    Target Date 10/29/21      PT LONG TERM GOAL #3   Title Pt will improve her lumbar FOTO score by at least 10 points as a demonstration of improved function.    Baseline Lumbar Spine FOTO 44 (07/09/2021); Lumbar FOTO 40. Pt might have anwered her back function limitation question differently secondary to pt stating more limited in function but that was before starting PT. Might have answered increased limitation in function during today's session since starting PT  instead which may have decrease her score. (08/11/2021).  47 (09/01/2021); 42 (09/24/2021)    Time 8    Period Weeks    Status On-going    Target Date 10/29/21              Plan - 10/15/21 0928     Clinical Impression Statement Worked on impriving posture, trunk and glute strength and decreasing B lumbar muscle tension to decrease stress to lumbar spine. Decreased B LE tightness reported aftrer session. Pt will benefit from continued skilled physical therapy services to decrease pain, improve strength and function.    Personal Factors and Comorbidities Age;Comorbidity 3+;Fitness;Past/Current Experience;Time since onset of injury/illness/exacerbation    Comorbidities B knee pain, arthritis, chronic kidney disease, COPD, dyspnea, HTN    Examination-Activity Limitations Squat;Lift;Stairs;Locomotion Level;Stand;Reach Overhead;Carry    Stability/Clinical Decision Making Stable/Uncomplicated    Rehab Potential Fair    PT Frequency 2x / week    PT Duration 8 weeks    PT Treatment/Interventions Therapeutic activities;Therapeutic exercise;Balance training;Neuromuscular re-education;Patient/family education;Manual techniques;Dry needling;Aquatic Therapy;Electrical Stimulation;Iontophoresis 41m/ml Dexamethasone    PT Next Visit Plan Posture, trunk and hip strengthening, manual techniques, modalities PRN    PT Home Exercise Plan Medbridge Access Code [email or web address]    Consulted and Agree with Plan of Care Patient                          MJoneen BoersPT, DPT  10/15/2021, 11:23 AM

## 2021-10-20 ENCOUNTER — Ambulatory Visit: Payer: Medicare Other

## 2021-10-20 DIAGNOSIS — M545 Low back pain, unspecified: Secondary | ICD-10-CM

## 2021-10-20 DIAGNOSIS — M6281 Muscle weakness (generalized): Secondary | ICD-10-CM

## 2021-10-20 DIAGNOSIS — R262 Difficulty in walking, not elsewhere classified: Secondary | ICD-10-CM

## 2021-10-20 DIAGNOSIS — M5416 Radiculopathy, lumbar region: Secondary | ICD-10-CM

## 2021-10-20 NOTE — Therapy (Signed)
OUTPATIENT PHYSICAL THERAPY TREATMENT NOTE    Patient Name: Julia Deleon MRN: [id number] DOB:1945/12/26, 76 y.o., female Today's Date: 10/20/2021  PCP: Charlynne Cousins, MD REFERRING PROVIDER: Diamond Nickel, DO   PT End of Session - 10/20/21 1431     Visit Number 26    Number of Visits 33    Date for PT Re-Evaluation 10/29/21    Authorization Type 6    Authorization Time Period of 10 progress report    PT Start Time 1431   Pt arrive late   PT Stop Time 1500    PT Time Calculation (min) 29 min    Activity Tolerance Patient tolerated treatment well    Behavior During Therapy WFL for tasks assessed/performed                             Past Medical History:  Diagnosis Date   Arthritis    Chronic kidney disease    COPD (chronic obstructive pulmonary disease) ([place])    pt denies    Dyspnea    Family history of adverse reaction to anesthesia    father was slow to wake up   Fuch's endothelial dystrophy    GERD (gastroesophageal reflux disease)    Hyperlipidemia    Hypertension    Hypothyroidism    Pneumonia    hx of    PONV (postoperative nausea and vomiting)    slow to wake up and PONV   Past Surgical History:  Procedure Laterality Date   ABDOMINAL HYSTERECTOMY     APPENDECTOMY     BACK SURGERY  09/26/2018   L4-5 PLIF by Dr. Arnoldo Morale   CARDIAC CATHETERIZATION     CHOLECYSTECTOMY     COLONOSCOPY WITH PROPOFOL N/A 10/23/2020   Procedure: COLONOSCOPY WITH PROPOFOL;  Surgeon: Virgel Manifold, MD;  Location: [facility] ENDOSCOPY;  Service: Endoscopy;  Laterality: N/A;   ESOPHAGOGASTRODUODENOSCOPY N/A 10/23/2020   Procedure: ESOPHAGOGASTRODUODENOSCOPY (EGD);  Surgeon: Virgel Manifold, MD;  Location: [facility] ENDOSCOPY;  Service: Endoscopy;  Laterality: N/A;   EYE SURGERY     FOOT SURGERY Right    pin removed left   laser vein surgery     NASAL SINUS SURGERY     RIGHT/LEFT HEART CATH AND CORONARY ANGIOGRAPHY N/A 12/06/2017   Procedure: RIGHT/LEFT HEART  CATH AND CORONARY ANGIOGRAPHY;  Surgeon: Yolonda Kida, MD;  Location: [facility];  Service: Cardiovascular;  Laterality: N/A;   ROBOTIC ASSISTED LAPAROSCOPIC SACROCOLPOPEXY Bilateral 07/30/2020   Procedure: XI ROBOTIC ASSISTED LAPAROSCOPIC SACROCOLPOPEXY AND SUPRACERVICAL HYSTERECTOMY WITH BILATERAL SALPINGO OOPHERECTOMY;  Surgeon: Ardis Hughs, MD;  Location: [facility];  Service: Urology;  Laterality: Bilateral;  REQUESTING 4 HRS   TONSILLECTOMY     Patient Active Problem List   Diagnosis Date Noted   Essential hypertension 01/23/2021   Cystocele with prolapse 07/30/2020   Postsurgical arthrodesis status 07/25/2020   Facet arthropathy 04/08/2020   Family history of breast cancer 11/16/2019   Body mass index (BMI) 25.0-25.9, adult 07/24/2019   Chronic UTI 12/06/2018   Status post lumbar surgery 12/06/2018   Spondylolisthesis, lumbar region 09/26/2018   Aortic atherosclerosis ([place]) 08/31/2018   Food sensitivity headache 03/29/2018   CAD (coronary artery disease) 02/23/2018   Advanced care planning/counseling discussion 03/28/2017   Varicose veins of both lower extremities with complications 40/98/1191   Arthritis 12/09/2016   Vaginal atrophy 09/10/2015   Vasomotor symptoms due to menopause 09/10/2015   Onychomycosis due to dermatophyte  04/07/2015   Bursitis of right shoulder 02/26/2015   Environmental and seasonal allergies 02/26/2015   Other allergic rhinitis 02/26/2015   Osteoporosis 11/26/2014   COPD (chronic obstructive pulmonary disease) ([place]) 11/26/2014   Fuchs' corneal dystrophy 11/26/2014   Benign hypertensive renal disease 11/26/2014   Hyperlipidemia 11/26/2014   Chronic kidney disease, stage III (moderate) ([place]) 11/26/2014   Hypothyroidism 11/26/2014   Endothelial corneal dystrophy 11/26/2014     REFERRING DIAG: Chronic L low back pain  THERAPY DIAG:  Chronic bilateral low back pain, unspecified whether sciatica present  Muscle weakness  (generalized)  Difficulty in walking, not elsewhere classified  Radiculopathy, lumbar region  PERTINENT HISTORY: Chronic low back pain. Had L4/5 fusion in her low back May 2020 which helped her sleep. Had PT following surgery which helped. Bowel and bladder are not perfect but improving. Had pelvic floor therapy which did not help. The bowel issues might have started after the back surgery. Feels like her urinary tract system quit due to the pain after back surgery. Was placed on a catheter for 6 months after the surgery. Denies saddle anesthesia. Pt also states having a kidney infection May 2022 and was diagnosed at the end of [place] 2022. Took all summer to get rid of the kidney infection which wiped her out and weakned her back.   PRECAUTIONS: No known precautions  SUBJECTIVE:  Woke up with R shoulder blade pain which gradually increased. Went to the chiropractor who worked it out. Low back is tired today and a little tightness R leg.     PAIN:  Are you having pain? 3/10 low back   TODAY'S TREATMENT:  Objective     Latex allergies     Therapeutic exercise    Bent over onto elevated mat table             Hip extension                          R 10x3                         L 10x3   Transversus abdominis contraction 10x5 seconds for 2 sets   Then with glute max squeeze 10x5 seconds  Seated B scapular retraction 10x3 with 5 second holds with transversus abdominis and glute max contraction   Decreased R shoulder blade pain   Improved exercise technique, movement at target joints, use of target muscles after mod verbal, visual, tactile cues.     Manual therapy   Seated STM B thoraco lumbar paraspinal R >L  muscles to decrease muscle tension  Decreased R thigh tightness back to baseline.       Response to treatment Pt tolerated session well without aggravation of sympoms.      Clinical impression  Pt arrived late so session was adjusted accordingly. Decreased R  scapular discomfort with scapular retraction exercises. Continued working on improving trunk and glute strength and decreasing B lumbar muscle tension to decrease stress to lumbar spine. Pt will benefit from continued skilled physical therapy services to decrease pain, improve strength and function.         PATIENT EDUCATION: Education details: there-ex, HEP Person educated: Patient Education method: Explanation, Demonstration, Tactile cues, Verbal cues, and Handouts Education comprehension: verbalized understanding and returned demonstration   HOME EXERCISE PROGRAM: Access Code: [email or web address] URL: [email or web address] Date: 08/04/2021 Prepared by: Joneen Boers  Exercises - Seated Transversus Abdominis Bracing  -  5 x daily - 7 x weekly - 3 sets - 10 reps - 5 seconds hold - Seated Trunk Rotation - Arms Crossed  - 1 x daily - 7 x weekly - 3 sets - 10 reps - 5 seconds  hold - Standing Gluteal Sets  - 1 x daily - 7 x weekly - 3 sets - 10 reps - 5 seconds hold - Supine Double Knee to Chest  - 1 x daily - 7 x weekly - 2 sets - 15 reps (Discontinued 08/06/2021 secondary to increased symptoms)  - Seated Sciatic Tensioner  - 1 x daily - 7 x weekly - 2 sets - 15 reps (Discontinued 3/303/2023 due to increased symptoms.) - Supine Posterior Pelvic Tilt  - 1 x daily - 7 x weekly - 3 sets - 10 reps - 5 seconds hold -Reclined Hip extension isometrics, leg straight to promote glute max muscle strengthening                                     R 10x5 seconds for 3 sets                                     L 10x5 seconds for 3 sets - Reclined Single Knee to Chest Stretch  - 1 x daily - 7 x weekly - 3 sets - 10 reps - 5 seconds hold     PT Short Term Goals - 08/11/21 1152       PT SHORT TERM GOAL #1   Title Pt will be independent with her initial HEP to decrease pain, improve strength and ability to ambulate longer distances.    Baseline Pt has started her initial HEP (07/09/2021); Has been  doing her HEP. Does not have questions (08/11/2021)    Time 3    Period Weeks    Status Achieved    Target Date 07/30/21              PT Long Term Goals - 09/24/21 1140       PT LONG TERM GOAL #1   Title Pt will have a decrease in low back pain to 4/10 or less at worst to promote ability to ambulate longer distances as well as perform standing tasks more comfortably.    Baseline 9/10 low back pain at most for the past 3 months. Pt also demonstrates B L5 radiating symptoms to knees (07/09/2021); 7-8/10 at most for the past 7 days, usually around 5 pm and later (08/11/2021); 7-8/10 at most for the past 7 days, usually in the evening. (09/01/2021); 7.5/10 back pain at most  for the past 7 days (09/24/2021)    Time 8    Period Weeks    Status Partially Met    Target Date 10/29/21      PT LONG TERM GOAL #2   Title Pt will improve B hip extension, abduction and ER strength by at least 1/2 MMT grade to promote ability to perform standing tasks with less back pain.    Baseline Hip extension 4+/5 R, 3+/5 L, hip abduction 4/5 R,  4-/5 L, hip ER 4-/5 R, 3+/5 L (07/09/2021); hip extensoin 4+/5 R, 5/5 L, hip abduction 4+/5 R and L, hip ER 4/5 R, 4/5 L (08/11/2021); hip extension 5/5 R and L, hip abduction 4+/5 R and L, hip  ER 4/5 R and L (09/01/2021); hip extension 5/5 R and L, hip abduction 4+/5 R and L, hip ER 4/5 R and L, pt states L knee is the side that needs a knee replacement (09/24/2021)    Time 8    Period Weeks    Status Partially Met    Target Date 10/29/21      PT LONG TERM GOAL #3   Title Pt will improve her lumbar FOTO score by at least 10 points as a demonstration of improved function.    Baseline Lumbar Spine FOTO 44 (07/09/2021); Lumbar FOTO 40. Pt might have anwered her back function limitation question differently secondary to pt stating more limited in function but that was before starting PT. Might have answered increased limitation in function during today's session since starting PT  instead which may have decrease her score. (08/11/2021).  47 (09/01/2021); 42 (09/24/2021)    Time 8    Period Weeks    Status On-going    Target Date 10/29/21              Plan - 10/20/21 1732     Clinical Impression Statement Pt arrived late so session was adjusted accordingly. Decreased R scapular discomfort with scapular retraction exercises. Continued working on improving trunk and glute strength and decreasing B lumbar muscle tension to decrease stress to lumbar spine. Pt will benefit from continued skilled physical therapy services to decrease pain, improve strength and function.    Personal Factors and Comorbidities Age;Comorbidity 3+;Fitness;Past/Current Experience;Time since onset of injury/illness/exacerbation    Comorbidities B knee pain, arthritis, chronic kidney disease, COPD, dyspnea, HTN    Examination-Activity Limitations Squat;Lift;Stairs;Locomotion Level;Stand;Reach Overhead;Carry    Stability/Clinical Decision Making Stable/Uncomplicated    Clinical Decision Making --    Rehab Potential Fair    PT Frequency 2x / week    PT Duration 8 weeks    PT Treatment/Interventions Therapeutic activities;Therapeutic exercise;Balance training;Neuromuscular re-education;Patient/family education;Manual techniques;Dry needling;Aquatic Therapy;Electrical Stimulation;Iontophoresis 30m/ml Dexamethasone    PT Next Visit Plan Posture, trunk and hip strengthening, manual techniques, modalities PRN    PT Home Exercise Plan Medbridge Access Code [email or web address]    Consulted and Agree with Plan of Care Patient                           MJoneen BoersPT, DPT  10/20/2021, 5:36 PM

## 2021-10-22 ENCOUNTER — Ambulatory Visit: Payer: Medicare Other

## 2021-10-22 DIAGNOSIS — M6281 Muscle weakness (generalized): Secondary | ICD-10-CM

## 2021-10-22 DIAGNOSIS — G8929 Other chronic pain: Secondary | ICD-10-CM

## 2021-10-22 DIAGNOSIS — M545 Low back pain, unspecified: Secondary | ICD-10-CM | POA: Diagnosis not present

## 2021-10-22 DIAGNOSIS — M5416 Radiculopathy, lumbar region: Secondary | ICD-10-CM

## 2021-10-22 DIAGNOSIS — R262 Difficulty in walking, not elsewhere classified: Secondary | ICD-10-CM

## 2021-10-22 NOTE — Therapy (Signed)
OUTPATIENT PHYSICAL THERAPY TREATMENT NOTE    Patient Name: Julia Deleon MRN: [id number] DOB:1945-11-08, 76 y.o., female Today's Date: 10/22/2021  PCP: Charlynne Cousins, MD REFERRING PROVIDER: Diamond Nickel, DO   PT End of Session - 10/22/21 0933     Visit Number 27    Number of Visits 33    Date for PT Re-Evaluation 10/29/21    Authorization Type 7    Authorization Time Period of 10 progress report    PT Start Time 0933    PT Stop Time 1018    PT Time Calculation (min) 45 min    Activity Tolerance Patient tolerated treatment well    Behavior During Therapy WFL for tasks assessed/performed                              Past Medical History:  Diagnosis Date   Arthritis    Chronic kidney disease    COPD (chronic obstructive pulmonary disease) ([place])    pt denies    Dyspnea    Family history of adverse reaction to anesthesia    father was slow to wake up   Fuch's endothelial dystrophy    GERD (gastroesophageal reflux disease)    Hyperlipidemia    Hypertension    Hypothyroidism    Pneumonia    hx of    PONV (postoperative nausea and vomiting)    slow to wake up and PONV   Past Surgical History:  Procedure Laterality Date   ABDOMINAL HYSTERECTOMY     APPENDECTOMY     BACK SURGERY  09/26/2018   L4-5 PLIF by Dr. Arnoldo Morale   CARDIAC CATHETERIZATION     CHOLECYSTECTOMY     COLONOSCOPY WITH PROPOFOL N/A 10/23/2020   Procedure: COLONOSCOPY WITH PROPOFOL;  Surgeon: Virgel Manifold, MD;  Location: [facility] ENDOSCOPY;  Service: Endoscopy;  Laterality: N/A;   ESOPHAGOGASTRODUODENOSCOPY N/A 10/23/2020   Procedure: ESOPHAGOGASTRODUODENOSCOPY (EGD);  Surgeon: Virgel Manifold, MD;  Location: [facility] ENDOSCOPY;  Service: Endoscopy;  Laterality: N/A;   EYE SURGERY     FOOT SURGERY Right    pin removed left   laser vein surgery     NASAL SINUS SURGERY     RIGHT/LEFT HEART CATH AND CORONARY ANGIOGRAPHY N/A 12/06/2017   Procedure: RIGHT/LEFT HEART CATH AND  CORONARY ANGIOGRAPHY;  Surgeon: Yolonda Kida, MD;  Location: [facility];  Service: Cardiovascular;  Laterality: N/A;   ROBOTIC ASSISTED LAPAROSCOPIC SACROCOLPOPEXY Bilateral 07/30/2020   Procedure: XI ROBOTIC ASSISTED LAPAROSCOPIC SACROCOLPOPEXY AND SUPRACERVICAL HYSTERECTOMY WITH BILATERAL SALPINGO OOPHERECTOMY;  Surgeon: Ardis Hughs, MD;  Location: [facility];  Service: Urology;  Laterality: Bilateral;  REQUESTING 4 HRS   TONSILLECTOMY     Patient Active Problem List   Diagnosis Date Noted   Essential hypertension 01/23/2021   Cystocele with prolapse 07/30/2020   Postsurgical arthrodesis status 07/25/2020   Facet arthropathy 04/08/2020   Family history of breast cancer 11/16/2019   Body mass index (BMI) 25.0-25.9, adult 07/24/2019   Chronic UTI 12/06/2018   Status post lumbar surgery 12/06/2018   Spondylolisthesis, lumbar region 09/26/2018   Aortic atherosclerosis ([place]) 08/31/2018   Food sensitivity headache 03/29/2018   CAD (coronary artery disease) 02/23/2018   Advanced care planning/counseling discussion 03/28/2017   Varicose veins of both lower extremities with complications 41/74/0814   Arthritis 12/09/2016   Vaginal atrophy 09/10/2015   Vasomotor symptoms due to menopause 09/10/2015   Onychomycosis due to dermatophyte 04/07/2015  Bursitis of right shoulder 02/26/2015   Environmental and seasonal allergies 02/26/2015   Other allergic rhinitis 02/26/2015   Osteoporosis 11/26/2014   COPD (chronic obstructive pulmonary disease) ([place]) 11/26/2014   Fuchs' corneal dystrophy 11/26/2014   Benign hypertensive renal disease 11/26/2014   Hyperlipidemia 11/26/2014   Chronic kidney disease, stage III (moderate) ([place]) 11/26/2014   Hypothyroidism 11/26/2014   Endothelial corneal dystrophy 11/26/2014     REFERRING DIAG: Chronic L low back pain  THERAPY DIAG:  Chronic bilateral low back pain, unspecified whether sciatica present  Muscle weakness  (generalized)  Difficulty in walking, not elsewhere classified  Radiculopathy, lumbar region  PERTINENT HISTORY: Chronic low back pain. Had L4/5 fusion in her low back May 2020 which helped her sleep. Had PT following surgery which helped. Bowel and bladder are not perfect but improving. Had pelvic floor therapy which did not help. The bowel issues might have started after the back surgery. Feels like her urinary tract system quit due to the pain after back surgery. Was placed on a catheter for 6 months after the surgery. Denies saddle anesthesia. Pt also states having a kidney infection May 2022 and was diagnosed at the end of [place] 2022. Took all summer to get rid of the kidney infection which wiped her out and weakned her back.   PRECAUTIONS: No known precautions  SUBJECTIVE:  Wants to order a TENS unit due to R scapular/ back pain. Willing to pay for it. Currently using an IF4000 model (borrowed from a friend which is helping her pain a lot). R shoulder pain is about a 5/10 currently.     PAIN:  Are you having pain? 3/10 low back. Has B lateral LE (L5 dermatome) tightness currently.    TODAY'S TREATMENT:  Objective     Latex allergies     Therapeutic exercise    Seated hip flexion 10x3 each LE  Standing B shoulder low rows yellow band 10x5 seconds for 3 sets   Seated manually resisted R lateral shift isometrics in neutral to promote more neutral posture. 10x3 with 5 seconds holds  Seated manually resisted R upper trunk rotation isometrics in neutral to promote more neutral lumbar posture 10x5 seconds for 3 sets  Seated B scapular retraction 10x3 with 5 second holds with transversus abdominis and glute max contraction    Sitting with lumbar towel roll  Gentle trunk extension 10x5 seconds for 3 sets   Transversus abdominis contraction 10x5 seconds for 2 sets   Decreased B L5 dermatome symptoms afterwards reported.      Improved exercise technique, movement at target  joints, use of target muscles after mod verbal, visual, tactile cues.          Response to treatment Pt tolerated session well without aggravation of sympoms.      Clinical impression  Decreased B L5 dermatome tightness with treatment to promote gentle lumbar extension using towel roll and transversus abdominis contraction. Pt will benefit from continued skilled physical therapy services to decrease pain, improve strength and function.         PATIENT EDUCATION: Education details: there-ex, HEP Person educated: Patient Education method: Explanation, Demonstration, Tactile cues, Verbal cues, and Handouts Education comprehension: verbalized understanding and returned demonstration   HOME EXERCISE PROGRAM: Access Code: [email or web address] URL: [email or web address] Date: 08/04/2021 Prepared by: Joneen Boers  Exercises - Seated Transversus Abdominis Bracing  - 5 x daily - 7 x weekly - 3 sets - 10 reps - 5 seconds hold - Seated Trunk Rotation - Arms  Crossed  - 1 x daily - 7 x weekly - 3 sets - 10 reps - 5 seconds  hold - Standing Gluteal Sets  - 1 x daily - 7 x weekly - 3 sets - 10 reps - 5 seconds hold - Supine Double Knee to Chest  - 1 x daily - 7 x weekly - 2 sets - 15 reps (Discontinued 08/06/2021 secondary to increased symptoms)  - Seated Sciatic Tensioner  - 1 x daily - 7 x weekly - 2 sets - 15 reps (Discontinued 3/303/2023 due to increased symptoms.) - Supine Posterior Pelvic Tilt  - 1 x daily - 7 x weekly - 3 sets - 10 reps - 5 seconds hold -Reclined Hip extension isometrics, leg straight to promote glute max muscle strengthening                                     R 10x5 seconds for 3 sets                                     L 10x5 seconds for 3 sets - Reclined Single Knee to Chest Stretch  - 1 x daily - 7 x weekly - 3 sets - 10 reps - 5 seconds hold     PT Short Term Goals - 08/11/21 1152       PT SHORT TERM GOAL #1   Title Pt will be independent with her  initial HEP to decrease pain, improve strength and ability to ambulate longer distances.    Baseline Pt has started her initial HEP (07/09/2021); Has been doing her HEP. Does not have questions (08/11/2021)    Time 3    Period Weeks    Status Achieved    Target Date 07/30/21              PT Long Term Goals - 09/24/21 1140       PT LONG TERM GOAL #1   Title Pt will have a decrease in low back pain to 4/10 or less at worst to promote ability to ambulate longer distances as well as perform standing tasks more comfortably.    Baseline 9/10 low back pain at most for the past 3 months. Pt also demonstrates B L5 radiating symptoms to knees (07/09/2021); 7-8/10 at most for the past 7 days, usually around 5 pm and later (08/11/2021); 7-8/10 at most for the past 7 days, usually in the evening. (09/01/2021); 7.5/10 back pain at most  for the past 7 days (09/24/2021)    Time 8    Period Weeks    Status Partially Met    Target Date 10/29/21      PT LONG TERM GOAL #2   Title Pt will improve B hip extension, abduction and ER strength by at least 1/2 MMT grade to promote ability to perform standing tasks with less back pain.    Baseline Hip extension 4+/5 R, 3+/5 L, hip abduction 4/5 R,  4-/5 L, hip ER 4-/5 R, 3+/5 L (07/09/2021); hip extensoin 4+/5 R, 5/5 L, hip abduction 4+/5 R and L, hip ER 4/5 R, 4/5 L (08/11/2021); hip extension 5/5 R and L, hip abduction 4+/5 R and L, hip ER 4/5 R and L (09/01/2021); hip extension 5/5 R and L, hip abduction 4+/5 R and L, hip ER 4/5 R and  L, pt states L knee is the side that needs a knee replacement (09/24/2021)    Time 8    Period Weeks    Status Partially Met    Target Date 10/29/21      PT LONG TERM GOAL #3   Title Pt will improve her lumbar FOTO score by at least 10 points as a demonstration of improved function.    Baseline Lumbar Spine FOTO 44 (07/09/2021); Lumbar FOTO 40. Pt might have anwered her back function limitation question differently secondary to pt stating more  limited in function but that was before starting PT. Might have answered increased limitation in function during today's session since starting PT instead which may have decrease her score. (08/11/2021).  47 (09/01/2021); 42 (09/24/2021)    Time 8    Period Weeks    Status On-going    Target Date 10/29/21              Plan - 10/22/21 0932     Clinical Impression Statement Decreased B L5 dermatome tightness with treatment to promote gentle lumbar extension using towel roll and transversus abdominis contraction. Pt will benefit from continued skilled physical therapy services to decrease pain, improve strength and function.    Personal Factors and Comorbidities Age;Comorbidity 3+;Fitness;Past/Current Experience;Time since onset of injury/illness/exacerbation    Comorbidities B knee pain, arthritis, chronic kidney disease, COPD, dyspnea, HTN    Examination-Activity Limitations Squat;Lift;Stairs;Locomotion Level;Stand;Reach Overhead;Carry    Stability/Clinical Decision Making Stable/Uncomplicated    Clinical Decision Making Low    Rehab Potential Fair    PT Frequency 2x / week    PT Duration 8 weeks    PT Treatment/Interventions Therapeutic activities;Therapeutic exercise;Balance training;Neuromuscular re-education;Patient/family education;Manual techniques;Dry needling;Aquatic Therapy;Electrical Stimulation;Iontophoresis 66m/ml Dexamethasone    PT Next Visit Plan Posture, trunk and hip strengthening, manual techniques, modalities PRN    PT Home Exercise Plan Medbridge Access Code [email or web address]    Consulted and Agree with Plan of Care Patient                            MJoneen BoersPT, DPT  10/22/2021, 1:15 PM

## 2021-10-27 ENCOUNTER — Ambulatory Visit: Payer: Medicare Other | Admitting: Internal Medicine

## 2021-10-27 ENCOUNTER — Ambulatory Visit: Payer: Medicare Other

## 2021-10-27 DIAGNOSIS — M5416 Radiculopathy, lumbar region: Secondary | ICD-10-CM

## 2021-10-27 DIAGNOSIS — M6281 Muscle weakness (generalized): Secondary | ICD-10-CM

## 2021-10-27 DIAGNOSIS — M545 Low back pain, unspecified: Secondary | ICD-10-CM

## 2021-10-27 DIAGNOSIS — R262 Difficulty in walking, not elsewhere classified: Secondary | ICD-10-CM

## 2021-10-27 NOTE — Therapy (Signed)
OUTPATIENT PHYSICAL THERAPY TREATMENT NOTE    Patient Name: Julia Deleon MRN: [id number] DOB:[birth year], 76 y.o., female Today's Date: 10/27/2021  PCP: Charlynne Cousins, MD REFERRING PROVIDER: Diamond Nickel, DO   PT End of Session - 10/27/21 1146     Visit Number 28    Number of Visits 33    Date for PT Re-Evaluation 10/29/21    Authorization Type 8    Authorization Time Period of 10 progress report    PT Start Time 1147    PT Stop Time 1233    PT Time Calculation (min) 46 min    Activity Tolerance Patient tolerated treatment well    Behavior During Therapy WFL for tasks assessed/performed                               Past Medical History:  Diagnosis Date   Arthritis    Chronic kidney disease    COPD (chronic obstructive pulmonary disease) ([place])    pt denies    Dyspnea    Family history of adverse reaction to anesthesia    father was slow to wake up   Fuch's endothelial dystrophy    GERD (gastroesophageal reflux disease)    Hyperlipidemia    Hypertension    Hypothyroidism    Pneumonia    hx of    PONV (postoperative nausea and vomiting)    slow to wake up and PONV   Past Surgical History:  Procedure Laterality Date   ABDOMINAL HYSTERECTOMY     APPENDECTOMY     BACK SURGERY  09/26/2018   L4-5 PLIF by Dr. Arnoldo Morale   CARDIAC CATHETERIZATION     CHOLECYSTECTOMY     COLONOSCOPY WITH PROPOFOL N/A 10/23/2020   Procedure: COLONOSCOPY WITH PROPOFOL;  Surgeon: Virgel Manifold, MD;  Location: [facility] ENDOSCOPY;  Service: Endoscopy;  Laterality: N/A;   ESOPHAGOGASTRODUODENOSCOPY N/A 10/23/2020   Procedure: ESOPHAGOGASTRODUODENOSCOPY (EGD);  Surgeon: Virgel Manifold, MD;  Location: [facility] ENDOSCOPY;  Service: Endoscopy;  Laterality: N/A;   EYE SURGERY     FOOT SURGERY Right    pin removed left   laser vein surgery     NASAL SINUS SURGERY     RIGHT/LEFT HEART CATH AND CORONARY ANGIOGRAPHY N/A 12/06/2017   Procedure: RIGHT/LEFT HEART CATH AND  CORONARY ANGIOGRAPHY;  Surgeon: Yolonda Kida, MD;  Location: [facility];  Service: Cardiovascular;  Laterality: N/A;   ROBOTIC ASSISTED LAPAROSCOPIC SACROCOLPOPEXY Bilateral 07/30/2020   Procedure: XI ROBOTIC ASSISTED LAPAROSCOPIC SACROCOLPOPEXY AND SUPRACERVICAL HYSTERECTOMY WITH BILATERAL SALPINGO OOPHERECTOMY;  Surgeon: Ardis Hughs, MD;  Location: [facility];  Service: Urology;  Laterality: Bilateral;  REQUESTING 4 HRS   TONSILLECTOMY     Patient Active Problem List   Diagnosis Date Noted   Essential hypertension 01/23/2021   Cystocele with prolapse 07/30/2020   Postsurgical arthrodesis status 07/25/2020   Facet arthropathy 04/08/2020   Family history of breast cancer 11/16/2019   Body mass index (BMI) 25.0-25.9, adult 07/24/2019   Chronic UTI 12/06/2018   Status post lumbar surgery 12/06/2018   Spondylolisthesis, lumbar region 09/26/2018   Aortic atherosclerosis ([place]) 08/31/2018   Food sensitivity headache 03/29/2018   CAD (coronary artery disease) 02/23/2018   Advanced care planning/counseling discussion 03/28/2017   Varicose veins of both lower extremities with complications 12/21/4816   Arthritis 12/09/2016   Vaginal atrophy 09/10/2015   Vasomotor symptoms due to menopause 09/10/2015   Onychomycosis due to dermatophyte 04/07/2015  Bursitis of right shoulder 02/26/2015   Environmental and seasonal allergies 02/26/2015   Other allergic rhinitis 02/26/2015   Osteoporosis 11/26/2014   COPD (chronic obstructive pulmonary disease) ([place]) 11/26/2014   Fuchs' corneal dystrophy 11/26/2014   Benign hypertensive renal disease 11/26/2014   Hyperlipidemia 11/26/2014   Chronic kidney disease, stage III (moderate) ([place]) 11/26/2014   Hypothyroidism 11/26/2014   Endothelial corneal dystrophy 11/26/2014     REFERRING DIAG: Chronic L low back pain  THERAPY DIAG:  Chronic bilateral low back pain, unspecified whether sciatica present  Muscle weakness  (generalized)  Difficulty in walking, not elsewhere classified  Radiculopathy, lumbar region  PERTINENT HISTORY: Chronic low back pain. Had L4/5 fusion in her low back May 2020 which helped her sleep. Had PT following surgery which helped. Bowel and bladder are not perfect but improving. Had pelvic floor therapy which did not help. The bowel issues might have started after the back surgery. Feels like her urinary tract system quit due to the pain after back surgery. Was placed on a catheter for 6 months after the surgery. Denies saddle anesthesia. Pt also states having a kidney infection May 2022 and was diagnosed at the end of Lun 2022. Took all summer to get rid of the kidney infection which wiped her out and weakned her back.   PRECAUTIONS: No known precautions  SUBJECTIVE:  Back is about a 3/10 currently (8/10 at most for the past 7 days) but the R shoulder blade is feeling better.     PAIN:  Are you having pain? 3/10 low back. Has B lateral LE (L5 dermatome) tightness currently L > R to the knees.    TODAY'S TREATMENT:  Objective     Latex allergies     Therapeutic exercise    Seated hip adduction small green ball squeeze 10x5 seconds for 2 sets  Sit <> stand from regular chair no UE assist 1x. L knee discomfort    Seated manually resisted L  lateral shift isometrics in neutral to promote more neutral posture. 10x2 with 5 seconds holds  Seated manually resisted R upper trunk rotation isometrics in neutral to promote more neutral lumbar posture 10x5 seconds for 2 sets  Seated manually resisted R  lateral shift isometrics in neutral to promote more neutral posture. 10x2 with 5 seconds holds  Seated R and L trunk rotation 10x each   Long sit test suggests anterior nutation of L innominate   Supine with L SKTC position, hip extension isometrics 10x5 seconds   Supine lowe trunk rotation  to the L 10x3 with 5 second holds   To the R 10x3 with 5 second holds    Slight  decrease in B LE tightness initially    Improved exercise technique, movement at target joints, use of target muscles after mod verbal, visual, tactile cues.          Response to treatment Pt tolerated session well without aggravation of sympoms. No back pain after session but a feeling of tight and tired. B LE feels tight and tired as well after session.      Clinical impression Continued working on improving posture, trunk and glute strength as well as trunk mobility to help decrease pressure to affected areas. Fair tolerance to today's session. Pt will benefit from continued skilled physical therapy services to decrease pain, improve strength and function.         PATIENT EDUCATION: Education details: there-ex, HEP Person educated: Patient Education method: Explanation, Demonstration, Tactile cues, Verbal cues,  and Handouts Education comprehension: verbalized understanding and returned demonstration   HOME EXERCISE PROGRAM: Access Code: [email or web address] URL: [email or web address] Date: 08/04/2021 Prepared by: Joneen Boers  Exercises - Seated Transversus Abdominis Bracing  - 5 x daily - 7 x weekly - 3 sets - 10 reps - 5 seconds hold - Seated Trunk Rotation - Arms Crossed  - 1 x daily - 7 x weekly - 3 sets - 10 reps - 5 seconds  hold - Standing Gluteal Sets  - 1 x daily - 7 x weekly - 3 sets - 10 reps - 5 seconds hold - Supine Double Knee to Chest  - 1 x daily - 7 x weekly - 2 sets - 15 reps (Discontinued 08/06/2021 secondary to increased symptoms)  - Seated Sciatic Tensioner  - 1 x daily - 7 x weekly - 2 sets - 15 reps (Discontinued 3/303/2023 due to increased symptoms.) - Supine Posterior Pelvic Tilt  - 1 x daily - 7 x weekly - 3 sets - 10 reps - 5 seconds hold -Reclined Hip extension isometrics, leg straight to promote glute max muscle strengthening                                     R 10x5 seconds for 3 sets                                     L 10x5 seconds  for 3 sets - Reclined Single Knee to Chest Stretch  - 1 x daily - 7 x weekly - 3 sets - 10 reps - 5 seconds hold     PT Short Term Goals - 08/11/21 1152       PT SHORT TERM GOAL #1   Title Pt will be independent with her initial HEP to decrease pain, improve strength and ability to ambulate longer distances.    Baseline Pt has started her initial HEP (07/09/2021); Has been doing her HEP. Does not have questions (08/11/2021)    Time 3    Period Weeks    Status Achieved    Target Date 07/30/21              PT Long Term Goals - 09/24/21 1140       PT LONG TERM GOAL #1   Title Pt will have a decrease in low back pain to 4/10 or less at worst to promote ability to ambulate longer distances as well as perform standing tasks more comfortably.    Baseline 9/10 low back pain at most for the past 3 months. Pt also demonstrates B L5 radiating symptoms to knees (07/09/2021); 7-8/10 at most for the past 7 days, usually around 5 pm and later (08/11/2021); 7-8/10 at most for the past 7 days, usually in the evening. (09/01/2021); 7.5/10 back pain at most  for the past 7 days (09/24/2021)    Time 8    Period Weeks    Status Partially Met    Target Date 10/29/21      PT LONG TERM GOAL #2   Title Pt will improve B hip extension, abduction and ER strength by at least 1/2 MMT grade to promote ability to perform standing tasks with less back pain.    Baseline Hip extension 4+/5 R, 3+/5 L, hip abduction 4/5 R,  4-/5 L, hip ER 4-/5 R, 3+/5  L (07/09/2021); hip extensoin 4+/5 R, 5/5 L, hip abduction 4+/5 R and L, hip ER 4/5 R, 4/5 L (08/11/2021); hip extension 5/5 R and L, hip abduction 4+/5 R and L, hip ER 4/5 R and L (09/01/2021); hip extension 5/5 R and L, hip abduction 4+/5 R and L, hip ER 4/5 R and L, pt states L knee is the side that needs a knee replacement (09/24/2021)    Time 8    Period Weeks    Status Partially Met    Target Date 10/29/21      PT LONG TERM GOAL #3   Title Pt will improve her lumbar FOTO  score by at least 10 points as a demonstration of improved function.    Baseline Lumbar Spine FOTO 44 (07/09/2021); Lumbar FOTO 40. Pt might have anwered her back function limitation question differently secondary to pt stating more limited in function but that was before starting PT. Might have answered increased limitation in function during today's session since starting PT instead which may have decrease her score. (08/11/2021).  47 (09/01/2021); 42 (09/24/2021)    Time 8    Period Weeks    Status On-going    Target Date 10/29/21              Plan - 10/27/21 1257     Clinical Impression Statement Continued working on improving posture, trunk and glute strength as well as trunk mobility to help decrease pressure to affected areas. Fair tolerance to today's session. Pt will benefit from continued skilled physical therapy services to decrease pain, improve strength and function.    Personal Factors and Comorbidities Age;Comorbidity 3+;Fitness;Past/Current Experience;Time since onset of injury/illness/exacerbation    Comorbidities B knee pain, arthritis, chronic kidney disease, COPD, dyspnea, HTN    Examination-Activity Limitations Squat;Lift;Stairs;Locomotion Level;Stand;Reach Overhead;Carry    Stability/Clinical Decision Making Stable/Uncomplicated    Clinical Decision Making Low    Rehab Potential Fair    PT Frequency 2x / week    PT Duration 8 weeks    PT Treatment/Interventions Therapeutic activities;Therapeutic exercise;Balance training;Neuromuscular re-education;Patient/family education;Manual techniques;Dry needling;Aquatic Therapy;Electrical Stimulation;Iontophoresis 12m/ml Dexamethasone    PT Next Visit Plan Posture, trunk and hip strengthening, manual techniques, modalities PRN    PT Home Exercise Plan Medbridge Access Code [email or web address]    Consulted and Agree with Plan of Care Patient                             MJoneen BoersPT, DPT  10/27/2021, 1:03 PM

## 2021-10-29 ENCOUNTER — Ambulatory Visit: Payer: Medicare Other

## 2021-10-29 DIAGNOSIS — M5416 Radiculopathy, lumbar region: Secondary | ICD-10-CM

## 2021-10-29 DIAGNOSIS — M545 Low back pain, unspecified: Secondary | ICD-10-CM | POA: Diagnosis not present

## 2021-10-29 DIAGNOSIS — M6281 Muscle weakness (generalized): Secondary | ICD-10-CM

## 2021-10-29 DIAGNOSIS — R262 Difficulty in walking, not elsewhere classified: Secondary | ICD-10-CM

## 2021-10-29 NOTE — Therapy (Signed)
OUTPATIENT PHYSICAL THERAPY TREATMENT NOTE    Patient Name: Julia Deleon MRN: [id number] DOB:04-24-1946, 76 y.o., female Today's Date: 10/29/2021  PCP: Charlynne Cousins, MD REFERRING PROVIDER: Diamond Nickel, DO   PT End of Session - 10/29/21 1104     Visit Number 29    Number of Visits 33    Date for PT Re-Evaluation 10/29/21    Authorization Type 9    Authorization Time Period of 10 progress report    PT Start Time 1105    PT Stop Time 1146    PT Time Calculation (min) 41 min    Activity Tolerance Patient tolerated treatment well    Behavior During Therapy WFL for tasks assessed/performed                                Past Medical History:  Diagnosis Date   Arthritis    Chronic kidney disease    COPD (chronic obstructive pulmonary disease) ([place])    pt denies    Dyspnea    Family history of adverse reaction to anesthesia    father was slow to wake up   Fuch's endothelial dystrophy    GERD (gastroesophageal reflux disease)    Hyperlipidemia    Hypertension    Hypothyroidism    Pneumonia    hx of    PONV (postoperative nausea and vomiting)    slow to wake up and PONV   Past Surgical History:  Procedure Laterality Date   ABDOMINAL HYSTERECTOMY     APPENDECTOMY     BACK SURGERY  09/26/2018   L4-5 PLIF by Dr. Arnoldo Morale   CARDIAC CATHETERIZATION     CHOLECYSTECTOMY     COLONOSCOPY WITH PROPOFOL N/A 10/23/2020   Procedure: COLONOSCOPY WITH PROPOFOL;  Surgeon: Virgel Manifold, MD;  Location: [facility] ENDOSCOPY;  Service: Endoscopy;  Laterality: N/A;   ESOPHAGOGASTRODUODENOSCOPY N/A 10/23/2020   Procedure: ESOPHAGOGASTRODUODENOSCOPY (EGD);  Surgeon: Virgel Manifold, MD;  Location: [facility] ENDOSCOPY;  Service: Endoscopy;  Laterality: N/A;   EYE SURGERY     FOOT SURGERY Right    pin removed left   laser vein surgery     NASAL SINUS SURGERY     RIGHT/LEFT HEART CATH AND CORONARY ANGIOGRAPHY N/A 12/06/2017   Procedure: RIGHT/LEFT HEART CATH AND  CORONARY ANGIOGRAPHY;  Surgeon: Yolonda Kida, MD;  Location: [facility];  Service: Cardiovascular;  Laterality: N/A;   ROBOTIC ASSISTED LAPAROSCOPIC SACROCOLPOPEXY Bilateral 07/30/2020   Procedure: XI ROBOTIC ASSISTED LAPAROSCOPIC SACROCOLPOPEXY AND SUPRACERVICAL HYSTERECTOMY WITH BILATERAL SALPINGO OOPHERECTOMY;  Surgeon: Ardis Hughs, MD;  Location: [facility];  Service: Urology;  Laterality: Bilateral;  REQUESTING 4 HRS   TONSILLECTOMY     Patient Active Problem List   Diagnosis Date Noted   Essential hypertension 01/23/2021   Cystocele with prolapse 07/30/2020   Postsurgical arthrodesis status 07/25/2020   Facet arthropathy 04/08/2020   Family history of breast cancer 11/16/2019   Body mass index (BMI) 25.0-25.9, adult 07/24/2019   Chronic UTI 12/06/2018   Status post lumbar surgery 12/06/2018   Spondylolisthesis, lumbar region 09/26/2018   Aortic atherosclerosis ([place]) 08/31/2018   Food sensitivity headache 03/29/2018   CAD (coronary artery disease) 02/23/2018   Advanced care planning/counseling discussion 03/28/2017   Varicose veins of both lower extremities with complications 01/60/1093   Arthritis 12/09/2016   Vaginal atrophy 09/10/2015   Vasomotor symptoms due to menopause 09/10/2015   Onychomycosis due to dermatophyte 04/07/2015  Bursitis of right shoulder 02/26/2015   Environmental and seasonal allergies 02/26/2015   Other allergic rhinitis 02/26/2015   Osteoporosis 11/26/2014   COPD (chronic obstructive pulmonary disease) ([place]) 11/26/2014   Fuchs' corneal dystrophy 11/26/2014   Benign hypertensive renal disease 11/26/2014   Hyperlipidemia 11/26/2014   Chronic kidney disease, stage III (moderate) ([place]) 11/26/2014   Hypothyroidism 11/26/2014   Endothelial corneal dystrophy 11/26/2014     REFERRING DIAG: Chronic L low back pain  THERAPY DIAG:  Chronic bilateral low back pain, unspecified whether sciatica present  Muscle weakness  (generalized)  Difficulty in walking, not elsewhere classified  Radiculopathy, lumbar region  PERTINENT HISTORY: Chronic low back pain. Had L4/5 fusion in her low back May 2020 which helped her sleep. Had PT following surgery which helped. Bowel and bladder are not perfect but improving. Had pelvic floor therapy which did not help. The bowel issues might have started after the back surgery. Feels like her urinary tract system quit due to the pain after back surgery. Was placed on a catheter for 6 months after the surgery. Denies saddle anesthesia. Pt also states having a kidney infection May 2022 and was diagnosed at the end of [place] 2022. Took all summer to get rid of the kidney infection which wiped her out and weakned her back.   PRECAUTIONS: No known precautions  SUBJECTIVE:  Back is ok, about a 4-5/10 currently has minor LE tightness.     PAIN:  Are you having pain? 4-5/10 low back.      TODAY'S TREATMENT:  Objective     Latex allergies     Therapeutic exercise    Seated trunk flexion   Forward 10x5 seconds. Pt states it feel like it opens up her back for 2 sets  R 5x5 seconds   L 5x5 seconds   Sitting with upright posture  B scapular retraction yellow band 10x5 seconds    Decreased trunk control observed   Manual trunk perturbation by PT 1 minute x 3 all planes    Weak trunk strength observed  Seated hip adduction small green ball squeeze 10x5 seconds for 2 sets  Pallof press yellow band  R resistance 5x5 seconds   L resistance 10x5 seconds   Difficulty secondary to trunk weakness.   Seated manually resisted L lateral shift isometrics in neutral to promote more neutral posture. 10x2 with 5 seconds holds     Improved exercise technique, movement at target joints, use of target muscles after mod verbal, visual, tactile cues.       Manual therapy    Seated STM B thoraco lumbar paraspinal R >L  muscles to decrease muscle tension                    Response to treatment Pt tolerated session well without aggravation of sympoms.     Clinical impression Continued working on improving posture, trunk/core strength and endurance secondary to significant weakness to help decrease pressure to her low back during activity. Fair tolerance to today's session. Pt will benefit from continued skilled physical therapy services to decrease pain, improve strength and function.         PATIENT EDUCATION: Education details: there-ex, HEP Person educated: Patient Education method: Explanation, Demonstration, Tactile cues, Verbal cues, and Handouts Education comprehension: verbalized understanding and returned demonstration   HOME EXERCISE PROGRAM: Access Code: [email or web address] URL: [email or web address] Date: 08/04/2021 Prepared by: Joneen Boers  Exercises - Seated Transversus Abdominis Bracing  - 5 x daily - 7 x  weekly - 3 sets - 10 reps - 5 seconds hold - Seated Trunk Rotation - Arms Crossed  - 1 x daily - 7 x weekly - 3 sets - 10 reps - 5 seconds  hold - Standing Gluteal Sets  - 1 x daily - 7 x weekly - 3 sets - 10 reps - 5 seconds hold - Supine Double Knee to Chest  - 1 x daily - 7 x weekly - 2 sets - 15 reps (Discontinued 08/06/2021 secondary to increased symptoms)  - Seated Sciatic Tensioner  - 1 x daily - 7 x weekly - 2 sets - 15 reps (Discontinued 3/303/2023 due to increased symptoms.) - Supine Posterior Pelvic Tilt  - 1 x daily - 7 x weekly - 3 sets - 10 reps - 5 seconds hold -Reclined Hip extension isometrics, leg straight to promote glute max muscle strengthening                                     R 10x5 seconds for 3 sets                                     L 10x5 seconds for 3 sets - Reclined Single Knee to Chest Stretch  - 1 x daily - 7 x weekly - 3 sets - 10 reps - 5 seconds hold     PT Short Term Goals - 08/11/21 1152       PT SHORT TERM GOAL #1   Title Pt will be independent with her initial HEP to  decrease pain, improve strength and ability to ambulate longer distances.    Baseline Pt has started her initial HEP (07/09/2021); Has been doing her HEP. Does not have questions (08/11/2021)    Time 3    Period Weeks    Status Achieved    Target Date 07/30/21              PT Long Term Goals - 09/24/21 1140       PT LONG TERM GOAL #1   Title Pt will have a decrease in low back pain to 4/10 or less at worst to promote ability to ambulate longer distances as well as perform standing tasks more comfortably.    Baseline 9/10 low back pain at most for the past 3 months. Pt also demonstrates B L5 radiating symptoms to knees (07/09/2021); 7-8/10 at most for the past 7 days, usually around 5 pm and later (08/11/2021); 7-8/10 at most for the past 7 days, usually in the evening. (09/01/2021); 7.5/10 back pain at most  for the past 7 days (09/24/2021)    Time 8    Period Weeks    Status Partially Met    Target Date 10/29/21      PT LONG TERM GOAL #2   Title Pt will improve B hip extension, abduction and ER strength by at least 1/2 MMT grade to promote ability to perform standing tasks with less back pain.    Baseline Hip extension 4+/5 R, 3+/5 L, hip abduction 4/5 R,  4-/5 L, hip ER 4-/5 R, 3+/5 L (07/09/2021); hip extensoin 4+/5 R, 5/5 L, hip abduction 4+/5 R and L, hip ER 4/5 R, 4/5 L (08/11/2021); hip extension 5/5 R and L, hip abduction 4+/5 R and L, hip ER 4/5 R and L (09/01/2021);  hip extension 5/5 R and L, hip abduction 4+/5 R and L, hip ER 4/5 R and L, pt states L knee is the side that needs a knee replacement (09/24/2021)    Time 8    Period Weeks    Status Partially Met    Target Date 10/29/21      PT LONG TERM GOAL #3   Title Pt will improve her lumbar FOTO score by at least 10 points as a demonstration of improved function.    Baseline Lumbar Spine FOTO 44 (07/09/2021); Lumbar FOTO 40. Pt might have anwered her back function limitation question differently secondary to pt stating more limited in  function but that was before starting PT. Might have answered increased limitation in function during today's session since starting PT instead which may have decrease her score. (08/11/2021).  47 (09/01/2021); 42 (09/24/2021)    Time 8    Period Weeks    Status On-going    Target Date 10/29/21              Plan - 10/29/21 1425     Clinical Impression Statement Continued working on improving posture, trunk/core strength and endurance secondary to significant weakness to help decrease pressure to her low back during activity. Fair tolerance to today's session. Pt will benefit from continued skilled physical therapy services to decrease pain, improve strength and function.    Personal Factors and Comorbidities Age;Comorbidity 3+;Fitness;Past/Current Experience;Time since onset of injury/illness/exacerbation    Comorbidities B knee pain, arthritis, chronic kidney disease, COPD, dyspnea, HTN    Examination-Activity Limitations Squat;Lift;Stairs;Locomotion Level;Stand;Reach Overhead;Carry    Stability/Clinical Decision Making Stable/Uncomplicated    Rehab Potential Fair    PT Frequency 2x / week    PT Duration 8 weeks    PT Treatment/Interventions Therapeutic activities;Therapeutic exercise;Balance training;Neuromuscular re-education;Patient/family education;Manual techniques;Dry needling;Aquatic Therapy;Electrical Stimulation;Iontophoresis 66m/ml Dexamethasone    PT Next Visit Plan Posture, trunk and hip strengthening, manual techniques, modalities PRN    PT Home Exercise Plan Medbridge Access Code [email or web address]    Consulted and Agree with Plan of Care Patient                              MJoneen BoersPT, DPT  10/29/2021, 2:28 PM

## 2021-11-03 ENCOUNTER — Ambulatory Visit: Payer: Medicare Other

## 2021-11-03 DIAGNOSIS — M545 Low back pain, unspecified: Secondary | ICD-10-CM

## 2021-11-03 DIAGNOSIS — M6281 Muscle weakness (generalized): Secondary | ICD-10-CM

## 2021-11-03 DIAGNOSIS — M5416 Radiculopathy, lumbar region: Secondary | ICD-10-CM

## 2021-11-03 DIAGNOSIS — R262 Difficulty in walking, not elsewhere classified: Secondary | ICD-10-CM

## 2021-11-05 ENCOUNTER — Ambulatory Visit: Payer: Medicare Other

## 2021-11-05 DIAGNOSIS — M545 Low back pain, unspecified: Secondary | ICD-10-CM | POA: Diagnosis not present

## 2021-11-05 DIAGNOSIS — G8929 Other chronic pain: Secondary | ICD-10-CM

## 2021-11-05 DIAGNOSIS — M5416 Radiculopathy, lumbar region: Secondary | ICD-10-CM

## 2021-11-05 DIAGNOSIS — M6281 Muscle weakness (generalized): Secondary | ICD-10-CM

## 2021-11-05 DIAGNOSIS — R262 Difficulty in walking, not elsewhere classified: Secondary | ICD-10-CM

## 2021-11-05 NOTE — Therapy (Signed)
OUTPATIENT PHYSICAL THERAPY TREATMENT NOTE     Patient Name: Julia Deleon MRN: [id number] DOB:[birth year], 76 y.o., female Today's Date: 11/05/2021  PCP: Charlynne Cousins, MD REFERRING PROVIDER: Diamond Nickel, DO   PT End of Session - 11/05/21 1013     Visit Number 31    Number of Visits 56    Date for PT Re-Evaluation 12/31/21    Authorization Type 1    Authorization Time Period of 10 progress report    PT Start Time 1013    PT Stop Time 1053    PT Time Calculation (min) 40 min    Activity Tolerance Patient tolerated treatment well    Behavior During Therapy WFL for tasks assessed/performed                                  Past Medical History:  Diagnosis Date   Arthritis    Chronic kidney disease    COPD (chronic obstructive pulmonary disease) ([place])    pt denies    Dyspnea    Family history of adverse reaction to anesthesia    father was slow to wake up   Fuch's endothelial dystrophy    GERD (gastroesophageal reflux disease)    Hyperlipidemia    Hypertension    Hypothyroidism    Pneumonia    hx of    PONV (postoperative nausea and vomiting)    slow to wake up and PONV   Past Surgical History:  Procedure Laterality Date   ABDOMINAL HYSTERECTOMY     APPENDECTOMY     BACK SURGERY  09/26/2018   L4-5 PLIF by Dr. Arnoldo Morale   CARDIAC CATHETERIZATION     CHOLECYSTECTOMY     COLONOSCOPY WITH PROPOFOL N/A 10/23/2020   Procedure: COLONOSCOPY WITH PROPOFOL;  Surgeon: Virgel Manifold, MD;  Location: [facility] ENDOSCOPY;  Service: Endoscopy;  Laterality: N/A;   ESOPHAGOGASTRODUODENOSCOPY N/A 10/23/2020   Procedure: ESOPHAGOGASTRODUODENOSCOPY (EGD);  Surgeon: Virgel Manifold, MD;  Location: [facility] ENDOSCOPY;  Service: Endoscopy;  Laterality: N/A;   EYE SURGERY     FOOT SURGERY Right    pin removed left   laser vein surgery     NASAL SINUS SURGERY     RIGHT/LEFT HEART CATH AND CORONARY ANGIOGRAPHY N/A 12/06/2017   Procedure: RIGHT/LEFT HEART CATH  AND CORONARY ANGIOGRAPHY;  Surgeon: Yolonda Kida, MD;  Location: [facility];  Service: Cardiovascular;  Laterality: N/A;   ROBOTIC ASSISTED LAPAROSCOPIC SACROCOLPOPEXY Bilateral 07/30/2020   Procedure: XI ROBOTIC ASSISTED LAPAROSCOPIC SACROCOLPOPEXY AND SUPRACERVICAL HYSTERECTOMY WITH BILATERAL SALPINGO OOPHERECTOMY;  Surgeon: Ardis Hughs, MD;  Location: [facility];  Service: Urology;  Laterality: Bilateral;  REQUESTING 4 HRS   TONSILLECTOMY     Patient Active Problem List   Diagnosis Date Noted   Essential hypertension 01/23/2021   Cystocele with prolapse 07/30/2020   Postsurgical arthrodesis status 07/25/2020   Facet arthropathy 04/08/2020   Family history of breast cancer 11/16/2019   Body mass index (BMI) 25.0-25.9, adult 07/24/2019   Chronic UTI 12/06/2018   Status post lumbar surgery 12/06/2018   Spondylolisthesis, lumbar region 09/26/2018   Aortic atherosclerosis ([place]) 08/31/2018   Food sensitivity headache 03/29/2018   CAD (coronary artery disease) 02/23/2018   Advanced care planning/counseling discussion 03/28/2017   Varicose veins of both lower extremities with complications 33/29/5188   Arthritis 12/09/2016   Vaginal atrophy 09/10/2015   Vasomotor symptoms due to menopause 09/10/2015   Onychomycosis due  to dermatophyte 04/07/2015   Bursitis of right shoulder 02/26/2015   Environmental and seasonal allergies 02/26/2015   Other allergic rhinitis 02/26/2015   Osteoporosis 11/26/2014   COPD (chronic obstructive pulmonary disease) ([place]) 11/26/2014   Fuchs' corneal dystrophy 11/26/2014   Benign hypertensive renal disease 11/26/2014   Hyperlipidemia 11/26/2014   Chronic kidney disease, stage III (moderate) ([place]) 11/26/2014   Hypothyroidism 11/26/2014   Endothelial corneal dystrophy 11/26/2014     REFERRING DIAG: Chronic L low back pain  THERAPY DIAG:  Chronic bilateral low back pain, unspecified whether sciatica present  Muscle weakness  (generalized)  Difficulty in walking, not elsewhere classified  Radiculopathy, lumbar region  PERTINENT HISTORY: Chronic low back pain. Had L4/5 fusion in her low back May 2020 which helped her sleep. Had PT following surgery which helped. Bowel and bladder are not perfect but improving. Had pelvic floor therapy which did not help. The bowel issues might have started after the back surgery. Feels like her urinary tract system quit due to the pain after back surgery. Was placed on a catheter for 6 months after the surgery. Denies saddle anesthesia. Pt also states having a kidney infection May 2022 and was diagnosed at the end of [place] 2022. Took all summer to get rid of the kidney infection which wiped her out and weakned her back.   PRECAUTIONS: No known precautions  SUBJECTIVE:  Back is doing ok, its tired. Spent the day in [place] yesterday.    PAIN:  Are you having pain? No pain, just tiredness. Just has the typical tightness after driving ( B LE)     TODAY'S TREATMENT:  Objective     Latex allergies     Therapeutic exercise    Seated manually resisted R lateral shift isometrics in neutral to promote more neutral trunk posture  10x3 with 5 second holds   SLS with B UE assist, moving LE on furniture slider  Hip abduction    R 10x3   L 10x3   Hip flexion/extension   R 10x3   L 10x3  Seated transversus abdominis contraction 10x3 with 5 second holds        Improved exercise technique, movement at target joints, use of target muscles after mod verbal, visual, tactile cues.      Manual therapy    Seated STM B thoraco lumbar paraspinal R >L  muscles to decrease muscle tension             Decreased R thigh tightness back to baseline.          Response to treatment Fair tolerance to today's session.     Clinical impression Worked on improving trunk and glute med and max strength to decrease low back stress during standing tasks. Fair tolerance to today's  session. Pt will benefit from continued skilled physical therapy services to decrease pain, improve strength and function.         PATIENT EDUCATION: Education details: there-ex, HEP Person educated: Patient Education method: Explanation, Demonstration, Tactile cues, Verbal cues, and Handouts Education comprehension: verbalized understanding and returned demonstration   HOME EXERCISE PROGRAM: Access Code: [email or web address] URL: [email or web address] Date: 08/04/2021 Prepared by: Joneen Boers  Exercises - Seated Transversus Abdominis Bracing  - 5 x daily - 7 x weekly - 3 sets - 10 reps - 5 seconds hold - Seated Trunk Rotation - Arms Crossed  - 1 x daily - 7 x weekly - 3 sets - 10 reps - 5 seconds  hold - Standing Gluteal Sets  -  1 x daily - 7 x weekly - 3 sets - 10 reps - 5 seconds hold - Supine Double Knee to Chest  - 1 x daily - 7 x weekly - 2 sets - 15 reps (Discontinued 08/06/2021 secondary to increased symptoms)  - Seated Sciatic Tensioner  - 1 x daily - 7 x weekly - 2 sets - 15 reps (Discontinued 3/303/2023 due to increased symptoms.) - Supine Posterior Pelvic Tilt  - 1 x daily - 7 x weekly - 3 sets - 10 reps - 5 seconds hold -Reclined Hip extension isometrics, leg straight to promote glute max muscle strengthening                                     R 10x5 seconds for 3 sets                                     L 10x5 seconds for 3 sets - Reclined Single Knee to Chest Stretch  - 1 x daily - 7 x weekly - 3 sets - 10 reps - 5 seconds hold     PT Short Term Goals - 08/11/21 1152       PT SHORT TERM GOAL #1   Title Pt will be independent with her initial HEP to decrease pain, improve strength and ability to ambulate longer distances.    Baseline Pt has started her initial HEP (07/09/2021); Has been doing her HEP. Does not have questions (08/11/2021)    Time 3    Period Weeks    Status Achieved    Target Date 07/30/21              PT Long Term Goals - 11/03/21 1023        PT LONG TERM GOAL #1   Title Pt will have a decrease in low back pain to 4/10 or less at worst to promote ability to ambulate longer distances as well as perform standing tasks more comfortably.    Baseline 9/10 low back pain at most for the past 3 months. Pt also demonstrates B L5 radiating symptoms to knees (07/09/2021); 7-8/10 at most for the past 7 days, usually around 5 pm and later (08/11/2021); 7-8/10 at most for the past 7 days, usually in the evening. (09/01/2021); 7.5/10 back pain at most  for the past 7 days (09/24/2021); 10/10 last Friday (other than that, 7-8/10) ( 11/03/2021)    Time 8    Period Weeks    Status On-going    Target Date 12/29/21      PT LONG TERM GOAL #2   Title Pt will improve B hip extension, abduction and ER strength by at least 1/2 MMT grade to promote ability to perform standing tasks with less back pain.    Baseline Hip extension 4+/5 R, 3+/5 L, hip abduction 4/5 R,  4-/5 L, hip ER 4-/5 R, 3+/5 L (07/09/2021); hip extensoin 4+/5 R, 5/5 L, hip abduction 4+/5 R and L, hip ER 4/5 R, 4/5 L (08/11/2021); hip extension 5/5 R and L, hip abduction 4+/5 R and L, hip ER 4/5 R and L (09/01/2021); hip extension 5/5 R and L, hip abduction 4+/5 R and L, hip ER 4/5 R and L, pt states L knee is the side that needs a knee replacement (09/24/2021); hip extension 5/5 R, 4+/5  L, hip abduction 4+/5 R, and 4/5 L, hip ER 4/5 R, 4/5 L (11/03/2021)    Time 8    Period Weeks    Status Achieved    Target Date 10/29/21      PT LONG TERM GOAL #3   Title Pt will improve her lumbar FOTO score by at least 10 points as a demonstration of improved function.    Baseline Lumbar Spine FOTO 44 (07/09/2021); Lumbar FOTO 40. Pt might have anwered her back function limitation question differently secondary to pt stating more limited in function but that was before starting PT. Might have answered increased limitation in function during today's session since starting PT instead which may have decrease her score.  (08/11/2021).  47 (09/01/2021); 42 (09/24/2021)    Time 8    Period Weeks    Status On-going    Target Date 12/31/21              Plan - 11/05/21 1013     Clinical Impression Statement Worked on improving trunk and glute med and max strength to decrease low back stress during standing tasks. Fair tolerance to today's session. Pt will benefit from continued skilled physical therapy services to decrease pain, improve strength and function.    Personal Factors and Comorbidities Age;Comorbidity 3+;Fitness;Past/Current Experience;Time since onset of injury/illness/exacerbation    Comorbidities B knee pain, arthritis, chronic kidney disease, COPD, dyspnea, HTN    Examination-Activity Limitations Squat;Lift;Stairs;Locomotion Level;Stand;Reach Overhead;Carry    Stability/Clinical Decision Making Stable/Uncomplicated    Clinical Decision Making Low    Rehab Potential Fair    PT Frequency 2x / week    PT Duration 8 weeks    PT Treatment/Interventions Therapeutic activities;Therapeutic exercise;Balance training;Neuromuscular re-education;Patient/family education;Manual techniques;Dry needling;Aquatic Therapy;Electrical Stimulation;Iontophoresis '4mg'$ /ml Dexamethasone    PT Next Visit Plan Posture, trunk and hip strengthening, manual techniques, modalities PRN    PT Home Exercise Plan Medbridge Access Code [email or web address]    Consulted and Agree with Plan of Care Patient                                Joneen Boers PT, DPT  11/05/2021, 10:56 AM

## 2021-11-16 ENCOUNTER — Ambulatory Visit: Payer: Medicare Other | Attending: Sports Medicine

## 2021-11-16 DIAGNOSIS — G8929 Other chronic pain: Secondary | ICD-10-CM | POA: Insufficient documentation

## 2021-11-16 DIAGNOSIS — M545 Low back pain, unspecified: Secondary | ICD-10-CM | POA: Diagnosis not present

## 2021-11-16 DIAGNOSIS — M5416 Radiculopathy, lumbar region: Secondary | ICD-10-CM | POA: Insufficient documentation

## 2021-11-16 DIAGNOSIS — M6281 Muscle weakness (generalized): Secondary | ICD-10-CM | POA: Insufficient documentation

## 2021-11-16 DIAGNOSIS — R262 Difficulty in walking, not elsewhere classified: Secondary | ICD-10-CM | POA: Insufficient documentation

## 2021-11-16 NOTE — Therapy (Signed)
OUTPATIENT PHYSICAL THERAPY TREATMENT NOTE     Patient Name: Julia Deleon MRN: [id number] DOB:04-Apr-1946, 76 y.o., female Today's Date: 11/16/2021  PCP: Charlynne Cousins, MD REFERRING PROVIDER: Diamond Nickel, DO   PT End of Session - 11/16/21 1418     Visit Number 32    Number of Visits 73    Date for PT Re-Evaluation 12/31/21    Authorization Type 2    Authorization Time Period of 10 progress report    PT Start Time 1418    PT Stop Time 1504    PT Time Calculation (min) 46 min    Activity Tolerance Patient tolerated treatment well    Behavior During Therapy WFL for tasks assessed/performed                                   Past Medical History:  Diagnosis Date   Arthritis    Chronic kidney disease    COPD (chronic obstructive pulmonary disease) ([place])    pt denies    Dyspnea    Family history of adverse reaction to anesthesia    father was slow to wake up   Fuch's endothelial dystrophy    GERD (gastroesophageal reflux disease)    Hyperlipidemia    Hypertension    Hypothyroidism    Pneumonia    hx of    PONV (postoperative nausea and vomiting)    slow to wake up and PONV   Past Surgical History:  Procedure Laterality Date   ABDOMINAL HYSTERECTOMY     APPENDECTOMY     BACK SURGERY  09/26/2018   L4-5 PLIF by Dr. Arnoldo Morale   CARDIAC CATHETERIZATION     CHOLECYSTECTOMY     COLONOSCOPY WITH PROPOFOL N/A 10/23/2020   Procedure: COLONOSCOPY WITH PROPOFOL;  Surgeon: Virgel Manifold, MD;  Location: [facility] ENDOSCOPY;  Service: Endoscopy;  Laterality: N/A;   ESOPHAGOGASTRODUODENOSCOPY N/A 10/23/2020   Procedure: ESOPHAGOGASTRODUODENOSCOPY (EGD);  Surgeon: Virgel Manifold, MD;  Location: [facility] ENDOSCOPY;  Service: Endoscopy;  Laterality: N/A;   EYE SURGERY     FOOT SURGERY Right    pin removed left   laser vein surgery     NASAL SINUS SURGERY     RIGHT/LEFT HEART CATH AND CORONARY ANGIOGRAPHY N/A 12/06/2017   Procedure: RIGHT/LEFT HEART  CATH AND CORONARY ANGIOGRAPHY;  Surgeon: Yolonda Kida, MD;  Location: [facility];  Service: Cardiovascular;  Laterality: N/A;   ROBOTIC ASSISTED LAPAROSCOPIC SACROCOLPOPEXY Bilateral 07/30/2020   Procedure: XI ROBOTIC ASSISTED LAPAROSCOPIC SACROCOLPOPEXY AND SUPRACERVICAL HYSTERECTOMY WITH BILATERAL SALPINGO OOPHERECTOMY;  Surgeon: Ardis Hughs, MD;  Location: [facility];  Service: Urology;  Laterality: Bilateral;  REQUESTING 4 HRS   TONSILLECTOMY     Patient Active Problem List   Diagnosis Date Noted   Essential hypertension 01/23/2021   Cystocele with prolapse 07/30/2020   Postsurgical arthrodesis status 07/25/2020   Facet arthropathy 04/08/2020   Family history of breast cancer 11/16/2019   Body mass index (BMI) 25.0-25.9, adult 07/24/2019   Chronic UTI 12/06/2018   Status post lumbar surgery 12/06/2018   Spondylolisthesis, lumbar region 09/26/2018   Aortic atherosclerosis ([place]) 08/31/2018   Food sensitivity headache 03/29/2018   CAD (coronary artery disease) 02/23/2018   Advanced care planning/counseling discussion 03/28/2017   Varicose veins of both lower extremities with complications 16/60/6301   Arthritis 12/09/2016   Vaginal atrophy 09/10/2015   Vasomotor symptoms due to menopause 09/10/2015   Onychomycosis  due to dermatophyte 04/07/2015   Bursitis of right shoulder 02/26/2015   Environmental and seasonal allergies 02/26/2015   Other allergic rhinitis 02/26/2015   Osteoporosis 11/26/2014   COPD (chronic obstructive pulmonary disease) ([place]) 11/26/2014   Fuchs' corneal dystrophy 11/26/2014   Benign hypertensive renal disease 11/26/2014   Hyperlipidemia 11/26/2014   Chronic kidney disease, stage III (moderate) ([place]) 11/26/2014   Hypothyroidism 11/26/2014   Endothelial corneal dystrophy 11/26/2014     REFERRING DIAG: Chronic L low back pain  THERAPY DIAG:  Chronic bilateral low back pain, unspecified whether sciatica present  Muscle weakness  (generalized)  Difficulty in walking, not elsewhere classified  Radiculopathy, lumbar region  PERTINENT HISTORY: Chronic low back pain. Had L4/5 fusion in her low back May 2020 which helped her sleep. Had PT following surgery which helped. Bowel and bladder are not perfect but improving. Had pelvic floor therapy which did not help. The bowel issues might have started after the back surgery. Feels like her urinary tract system quit due to the pain after back surgery. Was placed on a catheter for 6 months after the surgery. Denies saddle anesthesia. Pt also states having a kidney infection May 2022 and was diagnosed at the end of [place] 2022. Took all summer to get rid of the kidney infection which wiped her out and weakned her back.   PRECAUTIONS: No known precautions  SUBJECTIVE:  Back is in rough shape today. 7/10 currently, took an Advil at lunch. Went to [employer] last week.    PAIN:  Are you having pain? 7/10 low back pain currently     TODAY'S TREATMENT:  Objective     Latex allergies     Therapeutic exercise    Seated trunk flexion 10x10 seconds for 2 sets  Then to the L 10x10 seconds. L LE tightness afterwards  Then to the R 10x10 seconds. R LE tightness afterwards   Standing gentle lumbar extension 8x5 seconds  Reclined:   Hooklying    Hip extension isometrics, leg straight    R 10x3 with 5 second holds     L 10x3 with 5 second holds     B shoulder extension isometrics yellow band 10x5 seconds for 3 sets   Decreased B thigh tightness after aforementioned exercises     Improved exercise technique, movement at target joints, use of target muscles after mod verbal, visual, tactile cues.      Manual therapy   Seated STM B thoraco lumbar paraspinal muscles to decrease muscle tension               Decreased B LE tightness aftewards       Response to treatment Decreased low back pain and B LE tightness reported after session.      Clinical  impression Worked on core and glute max strengthening to decrease stress to low back. Decreased low back pain and B LE tightness reported after session. Pt will benefit from continued skilled physical therapy services to decrease pain, improve strength and function.         PATIENT EDUCATION: Education details: there-ex, HEP Person educated: Patient Education method: Explanation, Demonstration, Tactile cues, Verbal cues, and Handouts Education comprehension: verbalized understanding and returned demonstration   HOME EXERCISE PROGRAM: Access Code: [email or web address] URL: [email or web address] Date: 08/04/2021 Prepared by: Joneen Boers  Exercises - Seated Transversus Abdominis Bracing  - 5 x daily - 7 x weekly - 3 sets - 10 reps - 5 seconds hold - Seated Trunk Rotation - Arms Crossed  -  1 x daily - 7 x weekly - 3 sets - 10 reps - 5 seconds  hold - Standing Gluteal Sets  - 1 x daily - 7 x weekly - 3 sets - 10 reps - 5 seconds hold - Supine Double Knee to Chest  - 1 x daily - 7 x weekly - 2 sets - 15 reps (Discontinued 08/06/2021 secondary to increased symptoms)  - Seated Sciatic Tensioner  - 1 x daily - 7 x weekly - 2 sets - 15 reps (Discontinued 3/303/2023 due to increased symptoms.) - Supine Posterior Pelvic Tilt  - 1 x daily - 7 x weekly - 3 sets - 10 reps - 5 seconds hold -Reclined Hip extension isometrics, leg straight to promote glute max muscle strengthening                                     R 10x5 seconds for 3 sets                                     L 10x5 seconds for 3 sets - Reclined Single Knee to Chest Stretch  - 1 x daily - 7 x weekly - 3 sets - 10 reps - 5 seconds hold     PT Short Term Goals - 08/11/21 1152       PT SHORT TERM GOAL #1   Title Pt will be independent with her initial HEP to decrease pain, improve strength and ability to ambulate longer distances.    Baseline Pt has started her initial HEP (07/09/2021); Has been doing her HEP. Does not have  questions (08/11/2021)    Time 3    Period Weeks    Status Achieved    Target Date 07/30/21              PT Long Term Goals - 11/03/21 1023       PT LONG TERM GOAL #1   Title Pt will have a decrease in low back pain to 4/10 or less at worst to promote ability to ambulate longer distances as well as perform standing tasks more comfortably.    Baseline 9/10 low back pain at most for the past 3 months. Pt also demonstrates B L5 radiating symptoms to knees (07/09/2021); 7-8/10 at most for the past 7 days, usually around 5 pm and later (08/11/2021); 7-8/10 at most for the past 7 days, usually in the evening. (09/01/2021); 7.5/10 back pain at most  for the past 7 days (09/24/2021); 10/10 last Friday (other than that, 7-8/10) ( 11/03/2021)    Time 8    Period Weeks    Status On-going    Target Date 12/29/21      PT LONG TERM GOAL #2   Title Pt will improve B hip extension, abduction and ER strength by at least 1/2 MMT grade to promote ability to perform standing tasks with less back pain.    Baseline Hip extension 4+/5 R, 3+/5 L, hip abduction 4/5 R,  4-/5 L, hip ER 4-/5 R, 3+/5 L (07/09/2021); hip extensoin 4+/5 R, 5/5 L, hip abduction 4+/5 R and L, hip ER 4/5 R, 4/5 L (08/11/2021); hip extension 5/5 R and L, hip abduction 4+/5 R and L, hip ER 4/5 R and L (09/01/2021); hip extension 5/5 R and L, hip abduction 4+/5 R and L,  hip ER 4/5 R and L, pt states L knee is the side that needs a knee replacement (09/24/2021); hip extension 5/5 R, 4+/5 L, hip abduction 4+/5 R, and 4/5 L, hip ER 4/5 R, 4/5 L (11/03/2021)    Time 8    Period Weeks    Status Achieved    Target Date 10/29/21      PT LONG TERM GOAL #3   Title Pt will improve her lumbar FOTO score by at least 10 points as a demonstration of improved function.    Baseline Lumbar Spine FOTO 44 (07/09/2021); Lumbar FOTO 40. Pt might have anwered her back function limitation question differently secondary to pt stating more limited in function but that was before  starting PT. Might have answered increased limitation in function during today's session since starting PT instead which may have decrease her score. (08/11/2021).  47 (09/01/2021); 42 (09/24/2021)    Time 8    Period Weeks    Status On-going    Target Date 12/31/21              Plan - 11/16/21 1418     Clinical Impression Statement Worked on core and glute max strengthening to decrease stress to low back. Decreased low back pain and B LE tightness reported after session. Pt will benefit from continued skilled physical therapy services to decrease pain, improve strength and function.    Personal Factors and Comorbidities Age;Comorbidity 3+;Fitness;Past/Current Experience;Time since onset of injury/illness/exacerbation    Comorbidities B knee pain, arthritis, chronic kidney disease, COPD, dyspnea, HTN    Examination-Activity Limitations Squat;Lift;Stairs;Locomotion Level;Stand;Reach Overhead;Carry    Stability/Clinical Decision Making Stable/Uncomplicated    Rehab Potential Fair    PT Frequency 2x / week    PT Duration 8 weeks    PT Treatment/Interventions Therapeutic activities;Therapeutic exercise;Balance training;Neuromuscular re-education;Patient/family education;Manual techniques;Dry needling;Aquatic Therapy;Electrical Stimulation;Iontophoresis '4mg'$ /ml Dexamethasone    PT Next Visit Plan Posture, trunk and hip strengthening, manual techniques, modalities PRN    PT Home Exercise Plan Medbridge Access Code [email or web address]    Consulted and Agree with Plan of Care Patient                                 Joneen Boers PT, DPT  11/16/2021, 4:17 PM

## 2021-11-19 ENCOUNTER — Ambulatory Visit: Payer: Medicare Other

## 2021-11-19 DIAGNOSIS — M545 Low back pain, unspecified: Secondary | ICD-10-CM | POA: Diagnosis not present

## 2021-11-19 DIAGNOSIS — R262 Difficulty in walking, not elsewhere classified: Secondary | ICD-10-CM

## 2021-11-19 DIAGNOSIS — M6281 Muscle weakness (generalized): Secondary | ICD-10-CM

## 2021-11-19 DIAGNOSIS — G8929 Other chronic pain: Secondary | ICD-10-CM

## 2021-11-19 NOTE — Therapy (Signed)
OUTPATIENT PHYSICAL THERAPY TREATMENT NOTE     Patient Name: Julia Deleon MRN: [id number] DOB:[birth year], 76 y.o., female Today's Date: 11/19/2021  PCP: Charlynne Cousins, MD REFERRING PROVIDER: Diamond Nickel, DO   PT End of Session - 11/19/21 1021     Visit Number 33    Number of Visits 4    Date for PT Re-Evaluation 12/31/21    Authorization Type Medicare Part A&B    Authorization Time Period 11/03/21-01/03/22    PT Start Time 1015    PT Stop Time 1055    PT Time Calculation (min) 40 min    Equipment Utilized During Treatment Gait belt    Activity Tolerance Patient tolerated treatment well    Behavior During Therapy WFL for tasks assessed/performed                     Past Medical History:  Diagnosis Date   Arthritis    Chronic kidney disease    COPD (chronic obstructive pulmonary disease) ([place])    pt denies    Dyspnea    Family history of adverse reaction to anesthesia    father was slow to wake up   Fuch's endothelial dystrophy    GERD (gastroesophageal reflux disease)    Hyperlipidemia    Hypertension    Hypothyroidism    Pneumonia    hx of    PONV (postoperative nausea and vomiting)    slow to wake up and PONV   Past Surgical History:  Procedure Laterality Date   ABDOMINAL HYSTERECTOMY     APPENDECTOMY     BACK SURGERY  09/26/2018   L4-5 PLIF by Dr. Arnoldo Morale   CARDIAC CATHETERIZATION     CHOLECYSTECTOMY     COLONOSCOPY WITH PROPOFOL N/A 10/23/2020   Procedure: COLONOSCOPY WITH PROPOFOL;  Surgeon: Virgel Manifold, MD;  Location: [facility] ENDOSCOPY;  Service: Endoscopy;  Laterality: N/A;   ESOPHAGOGASTRODUODENOSCOPY N/A 10/23/2020   Procedure: ESOPHAGOGASTRODUODENOSCOPY (EGD);  Surgeon: Virgel Manifold, MD;  Location: [facility] ENDOSCOPY;  Service: Endoscopy;  Laterality: N/A;   EYE SURGERY     FOOT SURGERY Right    pin removed left   laser vein surgery     NASAL SINUS SURGERY     RIGHT/LEFT HEART CATH AND CORONARY ANGIOGRAPHY N/A 12/06/2017    Procedure: RIGHT/LEFT HEART CATH AND CORONARY ANGIOGRAPHY;  Surgeon: Yolonda Kida, MD;  Location: [facility];  Service: Cardiovascular;  Laterality: N/A;   ROBOTIC ASSISTED LAPAROSCOPIC SACROCOLPOPEXY Bilateral 07/30/2020   Procedure: XI ROBOTIC ASSISTED LAPAROSCOPIC SACROCOLPOPEXY AND SUPRACERVICAL HYSTERECTOMY WITH BILATERAL SALPINGO OOPHERECTOMY;  Surgeon: Ardis Hughs, MD;  Location: [facility];  Service: Urology;  Laterality: Bilateral;  REQUESTING 4 HRS   TONSILLECTOMY     Patient Active Problem List   Diagnosis Date Noted   Essential hypertension 01/23/2021   Cystocele with prolapse 07/30/2020   Postsurgical arthrodesis status 07/25/2020   Facet arthropathy 04/08/2020   Family history of breast cancer 11/16/2019   Body mass index (BMI) 25.0-25.9, adult 07/24/2019   Chronic UTI 12/06/2018   Status post lumbar surgery 12/06/2018   Spondylolisthesis, lumbar region 09/26/2018   Aortic atherosclerosis ([place]) 08/31/2018   Food sensitivity headache 03/29/2018   CAD (coronary artery disease) 02/23/2018   Advanced care planning/counseling discussion 03/28/2017   Varicose veins of both lower extremities with complications 75/02/2584   Arthritis 12/09/2016   Vaginal atrophy 09/10/2015   Vasomotor symptoms due to menopause 09/10/2015   Onychomycosis due to dermatophyte 04/07/2015  Bursitis of right shoulder 02/26/2015   Environmental and seasonal allergies 02/26/2015   Other allergic rhinitis 02/26/2015   Osteoporosis 11/26/2014   COPD (chronic obstructive pulmonary disease) ([place]) 11/26/2014   Fuchs' corneal dystrophy 11/26/2014   Benign hypertensive renal disease 11/26/2014   Hyperlipidemia 11/26/2014   Chronic kidney disease, stage III (moderate) ([place]) 11/26/2014   Hypothyroidism 11/26/2014   Endothelial corneal dystrophy 11/26/2014     REFERRING DIAG: Chronic L low back pain  THERAPY DIAG:  Chronic bilateral low back pain, unspecified whether sciatica  present  Muscle weakness (generalized)  Difficulty in walking, not elsewhere classified  Radiculopathy, lumbar region  PERTINENT HISTORY: Chronic low back pain. Had L4/5 fusion in her low back May 2020 which helped her sleep. Had PT following surgery which helped. Bowel and bladder are not perfect but improving. Had pelvic floor therapy which did not help. The bowel issues might have started after the back surgery. Feels like her urinary tract system quit due to the pain after back surgery. Was placed on a catheter for 6 months after the surgery. Denies saddle anesthesia. Pt also states having a kidney infection May 2022 and was diagnosed at the end of [place] 2022. Took all summer to get rid of the kidney infection which wiped her out and weakned her back.   PRECAUTIONS: No known precautions  SUBJECTIVE:  Pt doing well today, still recovering from vacay. Pain is around 3/10 today. Good checkup with her vascular doctor in [place]:  Are you having pain? 3/10 low back pain currently   TODAY'S TREATMENT:  Objective     Latex allergies Therapeutic exercise    Seated trunk flexion 10x10 seconds for 2 sets  Then to the L 10x10 seconds. L LE tightness afterwards  Then to the R 10x10 seconds. R LE tightness afterwards   Standing gentle lumbar extension 10x5 seconds  Reclined:   Hooklying    Glute set/quad set     R 10x3 with 5 second holds     L 10x3 with 5 second holds     B shoulder extension isometrics into plinth 10x5 seconds for 3 sets  Standing again: Bent knee extension 1x10 bilat, flexed over plinth  SLS with slight abducted limb 10x3sec bilat  Marching 10x3secH bilat    Improved exercise technique, movement at target joints, use of target muscles after mod verbal, visual, tactile cues.       PATIENT EDUCATION: Education details: there-ex, HEP Person educated: Patient Education method: Explanation, Demonstration, Tactile cues, Verbal cues, and  Handouts Education comprehension: verbalized understanding and returned demonstration   HOME EXERCISE PROGRAM: Access Code: [email or web address] URL: [email or web address] Date: 08/04/2021 Prepared by: Joneen Boers  Exercises - Seated Transversus Abdominis Bracing  - 5 x daily - 7 x weekly - 3 sets - 10 reps - 5 seconds hold - Seated Trunk Rotation - Arms Crossed  - 1 x daily - 7 x weekly - 3 sets - 10 reps - 5 seconds  hold - Standing Gluteal Sets  - 1 x daily - 7 x weekly - 3 sets - 10 reps - 5 seconds hold - Supine Double Knee to Chest  - 1 x daily - 7 x weekly - 2 sets - 15 reps (Discontinued 08/06/2021 secondary to increased symptoms)  - Seated Sciatic Tensioner  - 1 x daily - 7 x weekly - 2 sets - 15 reps (Discontinued 3/303/2023 due to increased symptoms.) - Supine Posterior Pelvic Tilt  - 1 x daily -  7 x weekly - 3 sets - 10 reps - 5 seconds hold -Reclined Hip extension isometrics, leg straight to promote glute max muscle strengthening                                     R 10x5 seconds for 3 sets                                     L 10x5 seconds for 3 sets - Reclined Single Knee to Chest Stretch  - 1 x daily - 7 x weekly - 3 sets - 10 reps - 5 seconds hold     PT Short Term Goals - 08/11/21 1152       PT SHORT TERM GOAL #1   Title Pt will be independent with her initial HEP to decrease pain, improve strength and ability to ambulate longer distances.    Baseline Pt has started her initial HEP (07/09/2021); Has been doing her HEP. Does not have questions (08/11/2021)    Time 3    Period Weeks    Status Achieved    Target Date 07/30/21              PT Long Term Goals - 11/03/21 1023       PT LONG TERM GOAL #1   Title Pt will have a decrease in low back pain to 4/10 or less at worst to promote ability to ambulate longer distances as well as perform standing tasks more comfortably.    Baseline 9/10 low back pain at most for the past 3 months. Pt also demonstrates B L5  radiating symptoms to knees (07/09/2021); 7-8/10 at most for the past 7 days, usually around 5 pm and later (08/11/2021); 7-8/10 at most for the past 7 days, usually in the evening. (09/01/2021); 7.5/10 back pain at most  for the past 7 days (09/24/2021); 10/10 last Friday (other than that, 7-8/10) ( 11/03/2021)    Time 8    Period Weeks    Status On-going    Target Date 12/29/21      PT LONG TERM GOAL #2   Title Pt will improve B hip extension, abduction and ER strength by at least 1/2 MMT grade to promote ability to perform standing tasks with less back pain.    Baseline Hip extension 4+/5 R, 3+/5 L, hip abduction 4/5 R,  4-/5 L, hip ER 4-/5 R, 3+/5 L (07/09/2021); hip extensoin 4+/5 R, 5/5 L, hip abduction 4+/5 R and L, hip ER 4/5 R, 4/5 L (08/11/2021); hip extension 5/5 R and L, hip abduction 4+/5 R and L, hip ER 4/5 R and L (09/01/2021); hip extension 5/5 R and L, hip abduction 4+/5 R and L, hip ER 4/5 R and L, pt states L knee is the side that needs a knee replacement (09/24/2021); hip extension 5/5 R, 4+/5 L, hip abduction 4+/5 R, and 4/5 L, hip ER 4/5 R, 4/5 L (11/03/2021)    Time 8    Period Weeks    Status Achieved    Target Date 10/29/21      PT LONG TERM GOAL #3   Title Pt will improve her lumbar FOTO score by at least 10 points as a demonstration of improved function.    Baseline Lumbar Spine FOTO 44 (07/09/2021); Lumbar FOTO 40. Pt might  have anwered her back function limitation question differently secondary to pt stating more limited in function but that was before starting PT. Might have answered increased limitation in function during today's session since starting PT instead which may have decrease her score. (08/11/2021).  47 (09/01/2021); 42 (09/24/2021)    Time 8    Period Weeks    Status On-going    Target Date 12/31/21              Plan - 11/19/21 1025     Clinical Impression Statement Worked on core and gluteus max strengthening to improve motor control of lumbar spine and improve  acitivty tolerance in basic ADL mobility. Decreased low back pain and BLE tightness reported after session. Pt will benefit from continued skilled physical therapy services to decrease pain, improve strength and function.    Personal Factors and Comorbidities Age;Comorbidity 3+;Fitness;Past/Current Experience;Time since onset of injury/illness/exacerbation    Comorbidities B knee pain, arthritis, chronic kidney disease, COPD, dyspnea, HTN    Examination-Activity Limitations Squat;Lift;Stairs;Locomotion Level;Stand;Reach Overhead;Carry    Stability/Clinical Decision Making Stable/Uncomplicated    Clinical Decision Making Low    Rehab Potential Fair    PT Frequency 2x / week    PT Duration 8 weeks    PT Treatment/Interventions Therapeutic activities;Therapeutic exercise;Balance training;Neuromuscular re-education;Patient/family education;Manual techniques;Dry needling;Aquatic Therapy;Electrical Stimulation;Iontophoresis '4mg'$ /ml Dexamethasone    PT Next Visit Plan Posture, trunk and hip strengthening, manual techniques, modalities PRN    PT Home Exercise Plan Medbridge Access Code [email or web address]    Consulted and Agree with Plan of Care Patient             10:31 AM, 11/19/21 Etta Grandchild, PT, DPT Physical Therapist - [place] [phone] (Office)    11/19/2021, 10:30 AM

## 2021-11-24 ENCOUNTER — Ambulatory Visit: Payer: Medicare Other

## 2021-11-24 DIAGNOSIS — M5416 Radiculopathy, lumbar region: Secondary | ICD-10-CM

## 2021-11-24 DIAGNOSIS — M545 Low back pain, unspecified: Secondary | ICD-10-CM | POA: Diagnosis not present

## 2021-11-24 DIAGNOSIS — M6281 Muscle weakness (generalized): Secondary | ICD-10-CM

## 2021-11-24 DIAGNOSIS — G8929 Other chronic pain: Secondary | ICD-10-CM

## 2021-11-24 DIAGNOSIS — R262 Difficulty in walking, not elsewhere classified: Secondary | ICD-10-CM

## 2021-11-24 NOTE — Therapy (Signed)
OUTPATIENT PHYSICAL THERAPY TREATMENT NOTE     Patient Name: Julia Deleon MRN: [id number] DOB:[birth year], 76 y.o., female Today's Date: 11/24/2021  PCP: Charlynne Cousins, MD REFERRING PROVIDER: Diamond Nickel, DO   PT End of Session - 11/24/21 1417     Visit Number 34    Number of Visits 66    Date for PT Re-Evaluation 12/31/21    Authorization Type Medicare Part A&B    Authorization Time Period 11/03/21-01/03/22    PT Start Time 1418    PT Stop Time 1459    PT Time Calculation (min) 41 min    Activity Tolerance Patient tolerated treatment well    Behavior During Therapy WFL for tasks assessed/performed                                    Past Medical History:  Diagnosis Date   Arthritis    Chronic kidney disease    COPD (chronic obstructive pulmonary disease) ([place])    pt denies    Dyspnea    Family history of adverse reaction to anesthesia    father was slow to wake up   Fuch's endothelial dystrophy    GERD (gastroesophageal reflux disease)    Hyperlipidemia    Hypertension    Hypothyroidism    Pneumonia    hx of    PONV (postoperative nausea and vomiting)    slow to wake up and PONV   Past Surgical History:  Procedure Laterality Date   ABDOMINAL HYSTERECTOMY     APPENDECTOMY     BACK SURGERY  09/26/2018   L4-5 PLIF by Dr. Arnoldo Morale   CARDIAC CATHETERIZATION     CHOLECYSTECTOMY     COLONOSCOPY WITH PROPOFOL N/A 10/23/2020   Procedure: COLONOSCOPY WITH PROPOFOL;  Surgeon: Virgel Manifold, MD;  Location: [facility] ENDOSCOPY;  Service: Endoscopy;  Laterality: N/A;   ESOPHAGOGASTRODUODENOSCOPY N/A 10/23/2020   Procedure: ESOPHAGOGASTRODUODENOSCOPY (EGD);  Surgeon: Virgel Manifold, MD;  Location[facility] ENDOSCOPY;  Service: Endoscopy;  Laterality: N/A;   EYE SURGERY     FOOT SURGERY Right    pin removed left   laser vein surgery     NASAL SINUS SURGERY     RIGHT/LEFT HEART CATH AND CORONARY ANGIOGRAPHY N/A 12/06/2017   Procedure:  RIGHT/LEFT HEART CATH AND CORONARY ANGIOGRAPHY;  Surgeon: Yolonda Kida, MD;  Location: [facility];  Service: Cardiovascular;  Laterality: N/A;   ROBOTIC ASSISTED LAPAROSCOPIC SACROCOLPOPEXY Bilateral 07/30/2020   Procedure: XI ROBOTIC ASSISTED LAPAROSCOPIC SACROCOLPOPEXY AND SUPRACERVICAL HYSTERECTOMY WITH BILATERAL SALPINGO OOPHERECTOMY;  Surgeon: Ardis Hughs, MD;  Location: [facility];  Service: Urology;  Laterality: Bilateral;  REQUESTING 4 HRS   TONSILLECTOMY     Patient Active Problem List   Diagnosis Date Noted   Essential hypertension 01/23/2021   Cystocele with prolapse 07/30/2020   Postsurgical arthrodesis status 07/25/2020   Facet arthropathy 04/08/2020   Family history of breast cancer 11/16/2019   Body mass index (BMI) 25.0-25.9, adult 07/24/2019   Chronic UTI 12/06/2018   Status post lumbar surgery 12/06/2018   Spondylolisthesis, lumbar region 09/26/2018   Aortic atherosclerosis ([place]) 08/31/2018   Food sensitivity headache 03/29/2018   CAD (coronary artery disease) 02/23/2018   Advanced care planning/counseling discussion 03/28/2017   Varicose veins of both lower extremities with complications 17/61/6073   Arthritis 12/09/2016   Vaginal atrophy 09/10/2015   Vasomotor symptoms due to menopause 09/10/2015   Onychomycosis  due to dermatophyte 04/07/2015   Bursitis of right shoulder 02/26/2015   Environmental and seasonal allergies 02/26/2015   Other allergic rhinitis 02/26/2015   Osteoporosis 11/26/2014   COPD (chronic obstructive pulmonary disease) ([place]) 11/26/2014   Fuchs' corneal dystrophy 11/26/2014   Benign hypertensive renal disease 11/26/2014   Hyperlipidemia 11/26/2014   Chronic kidney disease, stage III (moderate) ([place]) 11/26/2014   Hypothyroidism 11/26/2014   Endothelial corneal dystrophy 11/26/2014     REFERRING DIAG: Chronic L low back pain  THERAPY DIAG:  Chronic bilateral low back pain, unspecified whether sciatica present  Muscle  weakness (generalized)  Difficulty in walking, not elsewhere classified  Radiculopathy, lumbar region  PERTINENT HISTORY: Chronic low back pain. Had L4/5 fusion in her low back May 2020 which helped her sleep. Had PT following surgery which helped. Bowel and bladder are not perfect but improving. Had pelvic floor therapy which did not help. The bowel issues might have started after the back surgery. Feels like her urinary tract system quit due to the pain after back surgery. Was placed on a catheter for 6 months after the surgery. Denies saddle anesthesia. Pt also states having a kidney infection May 2022 and was diagnosed at the end of [place] 2022. Took all summer to get rid of the kidney infection which wiped her out and weakned her back.   PRECAUTIONS: No known precautions  SUBJECTIVE:  Back is about a 3/10 currently  PAIN:  Are you having pain? 3/10 low back pain currently     TODAY'S TREATMENT:  Objective     Latex allergies     Therapeutic exercise    Seated trunk flexion 10x10 seconds for 2 sets    Reclined:   Hooklying    Hip extension isometrics, leg straight    R 10x3 with 5 second holds     L 10x3 with 5 second holds                           B shoulder extension isometrics into plinth 10x5 seconds for 3 sets    SKTC     R 10x5 seconds for 2 sets; 3rd set with contralateral LE straight to promote hip flexor stretch    L 10x5 seconds for 2 sets; 3rd set with contralateral LE straight to promote hip flexor stretch    Mini crunch 10x3    Posterior pelvic tilt 10x10 seconds for 2 sets    Improved exercise technique, movement at target joints, use of target muscles after mod verbal, visual, tactile cues.         Response to treatment Fair tolerance to today's session     Clinical impression Continued working on core and glute max strengthening to decrease stress to low back. Fair tolerance to today's session. Pt will benefit from continued skilled  physical therapy services to decrease pain, improve strength and function.         PATIENT EDUCATION: Education details: there-ex, HEP Person educated: Patient Education method: Explanation, Demonstration, Tactile cues, Verbal cues, and Handouts Education comprehension: verbalized understanding and returned demonstration   HOME EXERCISE PROGRAM: Access Code: [email or web address] URL: [email or web address] Date: 08/04/2021 Prepared by: Joneen Boers  Exercises - Seated Transversus Abdominis Bracing  - 5 x daily - 7 x weekly - 3 sets - 10 reps - 5 seconds hold - Seated Trunk Rotation - Arms Crossed  - 1 x daily - 7 x weekly - 3 sets - 10 reps - 5 seconds  hold - Standing Gluteal Sets  - 1 x daily - 7 x weekly - 3 sets - 10 reps - 5 seconds hold - Supine Double Knee to Chest  - 1 x daily - 7 x weekly - 2 sets - 15 reps (Discontinued 08/06/2021 secondary to increased symptoms)  - Seated Sciatic Tensioner  - 1 x daily - 7 x weekly - 2 sets - 15 reps (Discontinued 3/303/2023 due to increased symptoms.) - Supine Posterior Pelvic Tilt  - 1 x daily - 7 x weekly - 3 sets - 10 reps - 5 seconds hold -Reclined Hip extension isometrics, leg straight to promote glute max muscle strengthening                                     R 10x5 seconds for 3 sets                                     L 10x5 seconds for 3 sets - Reclined Single Knee to Chest Stretch  - 1 x daily - 7 x weekly - 3 sets - 10 reps - 5 seconds hold     PT Short Term Goals - 08/11/21 1152       PT SHORT TERM GOAL #1   Title Pt will be independent with her initial HEP to decrease pain, improve strength and ability to ambulate longer distances.    Baseline Pt has started her initial HEP (07/09/2021); Has been doing her HEP. Does not have questions (08/11/2021)    Time 3    Period Weeks    Status Achieved    Target Date 07/30/21              PT Long Term Goals - 11/03/21 1023       PT LONG TERM GOAL #1   Title Pt will  have a decrease in low back pain to 4/10 or less at worst to promote ability to ambulate longer distances as well as perform standing tasks more comfortably.    Baseline 9/10 low back pain at most for the past 3 months. Pt also demonstrates B L5 radiating symptoms to knees (07/09/2021); 7-8/10 at most for the past 7 days, usually around 5 pm and later (08/11/2021); 7-8/10 at most for the past 7 days, usually in the evening. (09/01/2021); 7.5/10 back pain at most  for the past 7 days (09/24/2021); 10/10 last Friday (other than that, 7-8/10) ( 11/03/2021)    Time 8    Period Weeks    Status On-going    Target Date 12/29/21      PT LONG TERM GOAL #2   Title Pt will improve B hip extension, abduction and ER strength by at least 1/2 MMT grade to promote ability to perform standing tasks with less back pain.    Baseline Hip extension 4+/5 R, 3+/5 L, hip abduction 4/5 R,  4-/5 L, hip ER 4-/5 R, 3+/5 L (07/09/2021); hip extensoin 4+/5 R, 5/5 L, hip abduction 4+/5 R and L, hip ER 4/5 R, 4/5 L (08/11/2021); hip extension 5/5 R and L, hip abduction 4+/5 R and L, hip ER 4/5 R and L (09/01/2021); hip extension 5/5 R and L, hip abduction 4+/5 R and L, hip ER 4/5 R and L, pt states L knee is the side that needs a knee  replacement (09/24/2021); hip extension 5/5 R, 4+/5 L, hip abduction 4+/5 R, and 4/5 L, hip ER 4/5 R, 4/5 L (11/03/2021)    Time 8    Period Weeks    Status Achieved    Target Date 10/29/21      PT LONG TERM GOAL #3   Title Pt will improve her lumbar FOTO score by at least 10 points as a demonstration of improved function.    Baseline Lumbar Spine FOTO 44 (07/09/2021); Lumbar FOTO 40. Pt might have anwered her back function limitation question differently secondary to pt stating more limited in function but that was before starting PT. Might have answered increased limitation in function during today's session since starting PT instead which may have decrease her score. (08/11/2021).  47 (09/01/2021); 42 (09/24/2021)     Time 8    Period Weeks    Status On-going    Target Date 12/31/21              Plan - 11/24/21 1500     Clinical Impression Statement Continued working on core and glute max strengthening to decrease stress to low back. Fair tolerance to today's session. Pt will benefit from continued skilled physical therapy services to decrease pain, improve strength and function.    Personal Factors and Comorbidities Age;Comorbidity 3+;Fitness;Past/Current Experience;Time since onset of injury/illness/exacerbation    Comorbidities B knee pain, arthritis, chronic kidney disease, COPD, dyspnea, HTN    Examination-Activity Limitations Squat;Lift;Stairs;Locomotion Level;Stand;Reach Overhead;Carry    Stability/Clinical Decision Making Stable/Uncomplicated    Rehab Potential Fair    PT Frequency 2x / week    PT Duration 8 weeks    PT Treatment/Interventions Therapeutic activities;Therapeutic exercise;Balance training;Neuromuscular re-education;Patient/family education;Manual techniques;Dry needling;Aquatic Therapy;Electrical Stimulation;Iontophoresis '4mg'$ /ml Dexamethasone    PT Next Visit Plan Posture, trunk and hip strengthening, manual techniques, modalities PRN    PT Home Exercise Plan Medbridge Access Code [email or web address]    Consulted and Agree with Plan of Care Patient                                  Joneen Boers PT, DPT  11/24/2021, 3:01 PM

## 2021-11-26 ENCOUNTER — Ambulatory Visit: Payer: Medicare Other

## 2021-11-26 DIAGNOSIS — M5416 Radiculopathy, lumbar region: Secondary | ICD-10-CM

## 2021-11-26 DIAGNOSIS — M545 Low back pain, unspecified: Secondary | ICD-10-CM | POA: Diagnosis not present

## 2021-11-26 DIAGNOSIS — M6281 Muscle weakness (generalized): Secondary | ICD-10-CM

## 2021-11-26 DIAGNOSIS — G8929 Other chronic pain: Secondary | ICD-10-CM

## 2021-11-26 DIAGNOSIS — R262 Difficulty in walking, not elsewhere classified: Secondary | ICD-10-CM

## 2021-11-26 NOTE — Therapy (Signed)
OUTPATIENT PHYSICAL THERAPY TREATMENT NOTE     Patient Name: Julia Deleon MRN: [id number] DOB:1946-04-06, 76 y.o., female Today's Date: 11/26/2021  PCP: Charlynne Cousins, MD REFERRING PROVIDER: Diamond Nickel, DO   PT End of Session - 11/26/21 1546     Visit Number 35    Number of Visits 37    Date for PT Re-Evaluation 12/31/21    Authorization Type Medicare Part A&B    Authorization Time Period 11/03/21-01/03/22    PT Start Time 1547    PT Stop Time 1631    PT Time Calculation (min) 44 min    Activity Tolerance Patient tolerated treatment well    Behavior During Therapy WFL for tasks assessed/performed                                     Past Medical History:  Diagnosis Date   Arthritis    Chronic kidney disease    COPD (chronic obstructive pulmonary disease) ([place])    pt denies    Dyspnea    Family history of adverse reaction to anesthesia    father was slow to wake up   Fuch's endothelial dystrophy    GERD (gastroesophageal reflux disease)    Hyperlipidemia    Hypertension    Hypothyroidism    Pneumonia    hx of    PONV (postoperative nausea and vomiting)    slow to wake up and PONV   Past Surgical History:  Procedure Laterality Date   ABDOMINAL HYSTERECTOMY     APPENDECTOMY     BACK SURGERY  09/26/2018   L4-5 PLIF by Dr. Arnoldo Morale   CARDIAC CATHETERIZATION     CHOLECYSTECTOMY     COLONOSCOPY WITH PROPOFOL N/A 10/23/2020   Procedure: COLONOSCOPY WITH PROPOFOL;  Surgeon: Virgel Manifold, MD;  Location: [facility] ENDOSCOPY;  Service: Endoscopy;  Laterality: N/A;   ESOPHAGOGASTRODUODENOSCOPY N/A 10/23/2020   Procedure: ESOPHAGOGASTRODUODENOSCOPY (EGD);  Surgeon: Virgel Manifold, MD;  Location: [facility] ENDOSCOPY;  Service: Endoscopy;  Laterality: N/A;   EYE SURGERY     FOOT SURGERY Right    pin removed left   laser vein surgery     NASAL SINUS SURGERY     RIGHT/LEFT HEART CATH AND CORONARY ANGIOGRAPHY N/A 12/06/2017   Procedure:  RIGHT/LEFT HEART CATH AND CORONARY ANGIOGRAPHY;  Surgeon: Yolonda Kida, MD;  Location: [place] [facility];  Service: Cardiovascular;  Laterality: N/A;   ROBOTIC ASSISTED LAPAROSCOPIC SACROCOLPOPEXY Bilateral 07/30/2020   Procedure: XI ROBOTIC ASSISTED LAPAROSCOPIC SACROCOLPOPEXY AND SUPRACERVICAL HYSTERECTOMY WITH BILATERAL SALPINGO OOPHERECTOMY;  Surgeon: Ardis Hughs, MD;  Location: [facility];  Service: Urology;  Laterality: Bilateral;  REQUESTING 4 HRS   TONSILLECTOMY     Patient Active Problem List   Diagnosis Date Noted   Essential hypertension 01/23/2021   Cystocele with prolapse 07/30/2020   Postsurgical arthrodesis status 07/25/2020   Facet arthropathy 04/08/2020   Family history of breast cancer 11/16/2019   Body mass index (BMI) 25.0-25.9, adult 07/24/2019   Chronic UTI 12/06/2018   Status post lumbar surgery 12/06/2018   Spondylolisthesis, lumbar region 09/26/2018   Aortic atherosclerosis ([place]) 08/31/2018   Food sensitivity headache 03/29/2018   CAD (coronary artery disease) 02/23/2018   Advanced care planning/counseling discussion 03/28/2017   Varicose veins of both lower extremities with complications 25/95/6387   Arthritis 12/09/2016   Vaginal atrophy 09/10/2015   Vasomotor symptoms due to menopause 09/10/2015  Onychomycosis due to dermatophyte 04/07/2015   Bursitis of right shoulder 02/26/2015   Environmental and seasonal allergies 02/26/2015   Other allergic rhinitis 02/26/2015   Osteoporosis 11/26/2014   COPD (chronic obstructive pulmonary disease) ([place]) 11/26/2014   Fuchs' corneal dystrophy 11/26/2014   Benign hypertensive renal disease 11/26/2014   Hyperlipidemia 11/26/2014   Chronic kidney disease, stage III (moderate) ([place]) 11/26/2014   Hypothyroidism 11/26/2014   Endothelial corneal dystrophy 11/26/2014     REFERRING DIAG: Chronic L low back pain  THERAPY DIAG:  Chronic bilateral low back pain, unspecified whether sciatica present  Muscle  weakness (generalized)  Difficulty in walking, not elsewhere classified  Radiculopathy, lumbar region  PERTINENT HISTORY: Chronic low back pain. Had L4/5 fusion in her low back May 2020 which helped her sleep. Had PT following surgery which helped. Bowel and bladder are not perfect but improving. Had pelvic floor therapy which did not help. The bowel issues might have started after the back surgery. Feels like her urinary tract system quit due to the pain after back surgery. Was placed on a catheter for 6 months after the surgery. Denies saddle anesthesia. Pt also states having a kidney infection May 2022 and was diagnosed at the end of [place] 2022. Took all summer to get rid of the kidney infection which wiped her out and weakned her back.   PRECAUTIONS: No known precautions  SUBJECTIVE:  Did something to her L low back today. Other than that its ok. 7-8/10 low back pain at worst for the past 7 days. Where it bothers her is around 5-6 pm when she is trying to prepare supper.   PAIN:  Are you having pain? 2 to 3/10 low back pain currently     TODAY'S TREATMENT:  Objective     Latex allergies     Therapeutic exercise    Seated trunk flexion 10x10 seconds for 2 sets   Standing transversus abdominis contraction with posterior pelvic tilt 10x5 seconds for 3 sets  Seated B shoulder extension yellow band 10x5 seconds, then 5x5 seconds for 2 sets to promote trunk strength   Sitting with upright posture    Gentle manual trunk perturbation with PT all directions 1 minute x 3   Gentle manually resisted trunk flexion isometrics in neutral 10x3 with 5 seconds holds   Gentle manually resisted trunk extension isometrics in neutral 10x5 seconds for 2 sets         Improved exercise technique, movement at target joints, use of target muscles after mod verbal, visual, tactile cues.     Manual therapy  Seated STM B lumbar paraspinal muscles to decrease tension.          Response  to treatment  Decreased B LE tightness after manual therapy to decrease B lumbar paraspinal muscle tension.      Clinical impression  Continued working on posture as well as trunk strength to promote ability to work in her kitchen to prepare supper more comfortably for her back. Pt tolerated session well without aggravation of symptoms. Decreased B LE tightness with manual therapy to decrease B lumbar paraspinal muscle tension.  Pt will benefit from continued skilled physical therapy services to decrease pain, improve strength and function.         PATIENT EDUCATION: Education details: there-ex, HEP Person educated: Patient Education method: Explanation, Demonstration, Tactile cues, Verbal cues, and Handouts Education comprehension: verbalized understanding and returned demonstration   HOME EXERCISE PROGRAM: Access Code: [email or web address] URL: [email or web address] Date: 08/04/2021 Prepared by: Kittie Plater  Damico Partin  Exercises - Seated Transversus Abdominis Bracing  - 5 x daily - 7 x weekly - 3 sets - 10 reps - 5 seconds hold - Seated Trunk Rotation - Arms Crossed  - 1 x daily - 7 x weekly - 3 sets - 10 reps - 5 seconds  hold - Standing Gluteal Sets  - 1 x daily - 7 x weekly - 3 sets - 10 reps - 5 seconds hold - Supine Double Knee to Chest  - 1 x daily - 7 x weekly - 2 sets - 15 reps (Discontinued 08/06/2021 secondary to increased symptoms)  - Seated Sciatic Tensioner  - 1 x daily - 7 x weekly - 2 sets - 15 reps (Discontinued 3/303/2023 due to increased symptoms.) - Supine Posterior Pelvic Tilt  - 1 x daily - 7 x weekly - 3 sets - 10 reps - 5 seconds hold -Reclined Hip extension isometrics, leg straight to promote glute max muscle strengthening                                     R 10x5 seconds for 3 sets                                     L 10x5 seconds for 3 sets - Reclined Single Knee to Chest Stretch  - 1 x daily - 7 x weekly - 3 sets - 10 reps - 5 seconds hold     PT Short  Term Goals - 08/11/21 1152       PT SHORT TERM GOAL #1   Title Pt will be independent with her initial HEP to decrease pain, improve strength and ability to ambulate longer distances.    Baseline Pt has started her initial HEP (07/09/2021); Has been doing her HEP. Does not have questions (08/11/2021)    Time 3    Period Weeks    Status Achieved    Target Date 07/30/21              PT Long Term Goals - 11/03/21 1023       PT LONG TERM GOAL #1   Title Pt will have a decrease in low back pain to 4/10 or less at worst to promote ability to ambulate longer distances as well as perform standing tasks more comfortably.    Baseline 9/10 low back pain at most for the past 3 months. Pt also demonstrates B L5 radiating symptoms to knees (07/09/2021); 7-8/10 at most for the past 7 days, usually around 5 pm and later (08/11/2021); 7-8/10 at most for the past 7 days, usually in the evening. (09/01/2021); 7.5/10 back pain at most  for the past 7 days (09/24/2021); 10/10 last Friday (other than that, 7-8/10) ( 11/03/2021)    Time 8    Period Weeks    Status On-going    Target Date 12/29/21      PT LONG TERM GOAL #2   Title Pt will improve B hip extension, abduction and ER strength by at least 1/2 MMT grade to promote ability to perform standing tasks with less back pain.    Baseline Hip extension 4+/5 R, 3+/5 L, hip abduction 4/5 R,  4-/5 L, hip ER 4-/5 R, 3+/5 L (07/09/2021); hip extensoin 4+/5 R, 5/5 L, hip abduction 4+/5 R and L, hip ER  4/5 R, 4/5 L (08/11/2021); hip extension 5/5 R and L, hip abduction 4+/5 R and L, hip ER 4/5 R and L (09/01/2021); hip extension 5/5 R and L, hip abduction 4+/5 R and L, hip ER 4/5 R and L, pt states L knee is the side that needs a knee replacement (09/24/2021); hip extension 5/5 R, 4+/5 L, hip abduction 4+/5 R, and 4/5 L, hip ER 4/5 R, 4/5 L (11/03/2021)    Time 8    Period Weeks    Status Achieved    Target Date 10/29/21      PT LONG TERM GOAL #3   Title Pt will improve her  lumbar FOTO score by at least 10 points as a demonstration of improved function.    Baseline Lumbar Spine FOTO 44 (07/09/2021); Lumbar FOTO 40. Pt might have anwered her back function limitation question differently secondary to pt stating more limited in function but that was before starting PT. Might have answered increased limitation in function during today's session since starting PT instead which may have decrease her score. (08/11/2021).  47 (09/01/2021); 42 (09/24/2021)    Time 8    Period Weeks    Status On-going    Target Date 12/31/21              Plan - 11/26/21 1621     Clinical Impression Statement Continued working on posture as well as trunk strength to promote ability to work in her kitchen to prepare supper more comfortably for her back. Pt tolerated session well without aggravation of symptoms. Decreased B LE tightness with manual therapy to decrease B lumbar paraspinal muscle tension.  Pt will benefit from continued skilled physical therapy services to decrease pain, improve strength and function.    Personal Factors and Comorbidities Age;Comorbidity 3+;Fitness;Past/Current Experience;Time since onset of injury/illness/exacerbation    Comorbidities B knee pain, arthritis, chronic kidney disease, COPD, dyspnea, HTN    Examination-Activity Limitations Squat;Lift;Stairs;Locomotion Level;Stand;Reach Overhead;Carry    Stability/Clinical Decision Making Stable/Uncomplicated    Rehab Potential Fair    PT Frequency 2x / week    PT Duration 8 weeks    PT Treatment/Interventions Therapeutic activities;Therapeutic exercise;Balance training;Neuromuscular re-education;Patient/family education;Manual techniques;Dry needling;Aquatic Therapy;Electrical Stimulation;Iontophoresis '4mg'$ /ml Dexamethasone    PT Next Visit Plan Posture, trunk and hip strengthening, manual techniques, modalities PRN    PT Home Exercise Plan Medbridge Access Code [email or web address]    Consulted and Agree with Plan of Care  Patient                                   Joneen Boers PT, DPT  11/26/2021, 4:39 PM

## 2021-11-27 ENCOUNTER — Other Ambulatory Visit: Payer: Self-pay | Admitting: Obstetrics and Gynecology

## 2021-11-27 DIAGNOSIS — Z1231 Encounter for screening mammogram for malignant neoplasm of breast: Secondary | ICD-10-CM

## 2021-12-01 ENCOUNTER — Ambulatory Visit: Payer: Medicare Other

## 2021-12-01 DIAGNOSIS — M545 Low back pain, unspecified: Secondary | ICD-10-CM | POA: Diagnosis not present

## 2021-12-01 DIAGNOSIS — R262 Difficulty in walking, not elsewhere classified: Secondary | ICD-10-CM

## 2021-12-01 DIAGNOSIS — M5416 Radiculopathy, lumbar region: Secondary | ICD-10-CM

## 2021-12-01 DIAGNOSIS — M6281 Muscle weakness (generalized): Secondary | ICD-10-CM

## 2021-12-01 NOTE — Therapy (Signed)
OUTPATIENT PHYSICAL THERAPY TREATMENT NOTE     Patient Name: Julia Deleon MRN: [id number] DOB:1945/06/02, 76 y.o., female Today's Date: 12/01/2021  PCP: Charlynne Cousins, MD REFERRING PROVIDER: Diamond Nickel, DO   PT End of Session - 12/01/21 1010     Visit Number 55    Number of Visits 66    Date for PT Re-Evaluation 12/31/21    Authorization Type Medicare Part A&B    Authorization Time Period 11/03/21-01/03/22    PT Start Time 1014    PT Stop Time 1059    PT Time Calculation (min) 45 min    Activity Tolerance Patient tolerated treatment well    Behavior During Therapy WFL for tasks assessed/performed                      Past Medical History:  Diagnosis Date   Arthritis    Chronic kidney disease    COPD (chronic obstructive pulmonary disease) ([place])    pt denies    Dyspnea    Family history of adverse reaction to anesthesia    father was slow to wake up   Fuch's endothelial dystrophy    GERD (gastroesophageal reflux disease)    Hyperlipidemia    Hypertension    Hypothyroidism    Pneumonia    hx of    PONV (postoperative nausea and vomiting)    slow to wake up and PONV   Past Surgical History:  Procedure Laterality Date   ABDOMINAL HYSTERECTOMY     APPENDECTOMY     BACK SURGERY  09/26/2018   L4-5 PLIF by Dr. Arnoldo Morale   CARDIAC CATHETERIZATION     CHOLECYSTECTOMY     COLONOSCOPY WITH PROPOFOL N/A 10/23/2020   Procedure: COLONOSCOPY WITH PROPOFOL;  Surgeon: Virgel Manifold, MD;  Location: [facility] ENDOSCOPY;  Service: Endoscopy;  Laterality: N/A;   ESOPHAGOGASTRODUODENOSCOPY N/A 10/23/2020   Procedure: ESOPHAGOGASTRODUODENOSCOPY (EGD);  Surgeon: Virgel Manifold, MD;  Location: [facility] ENDOSCOPY;  Service: Endoscopy;  Laterality: N/A;   EYE SURGERY     FOOT SURGERY Right    pin removed left   laser vein surgery     NASAL SINUS SURGERY     RIGHT/LEFT HEART CATH AND CORONARY ANGIOGRAPHY N/A 12/06/2017   Procedure: RIGHT/LEFT HEART CATH AND CORONARY  ANGIOGRAPHY;  Surgeon: Yolonda Kida, MD;  Location: [place] [facility];  Service: Cardiovascular;  Laterality: N/A;   ROBOTIC ASSISTED LAPAROSCOPIC SACROCOLPOPEXY Bilateral 07/30/2020   Procedure: XI ROBOTIC ASSISTED LAPAROSCOPIC SACROCOLPOPEXY AND SUPRACERVICAL HYSTERECTOMY WITH BILATERAL SALPINGO OOPHERECTOMY;  Surgeon: Ardis Hughs, MD;  Location: [facility];  Service: Urology;  Laterality: Bilateral;  REQUESTING 4 HRS   TONSILLECTOMY     Patient Active Problem List   Diagnosis Date Noted   Essential hypertension 01/23/2021   Cystocele with prolapse 07/30/2020   Postsurgical arthrodesis status 07/25/2020   Facet arthropathy 04/08/2020   Family history of breast cancer 11/16/2019   Body mass index (BMI) 25.0-25.9, adult 07/24/2019   Chronic UTI 12/06/2018   Status post lumbar surgery 12/06/2018   Spondylolisthesis, lumbar region 09/26/2018   Aortic atherosclerosis ([place]) 08/31/2018   Food sensitivity headache 03/29/2018   CAD (coronary artery disease) 02/23/2018   Advanced care planning/counseling discussion 03/28/2017   Varicose veins of both lower extremities with complications 47/42/5956   Arthritis 12/09/2016   Vaginal atrophy 09/10/2015   Vasomotor symptoms due to menopause 09/10/2015   Onychomycosis due to dermatophyte 04/07/2015   Bursitis of right shoulder 02/26/2015   Environmental  and seasonal allergies 02/26/2015   Other allergic rhinitis 02/26/2015   Osteoporosis 11/26/2014   COPD (chronic obstructive pulmonary disease) ([place]) 11/26/2014   Fuchs' corneal dystrophy 11/26/2014   Benign hypertensive renal disease 11/26/2014   Hyperlipidemia 11/26/2014   Chronic kidney disease, stage III (moderate) ([place]) 11/26/2014   Hypothyroidism 11/26/2014   Endothelial corneal dystrophy 11/26/2014    REFERRING DIAG: Chronic L low back pain  THERAPY DIAG:  Chronic bilateral low back pain, unspecified whether sciatica present  Muscle weakness (generalized)  Difficulty  in walking, not elsewhere classified  Radiculopathy, lumbar region  PERTINENT HISTORY: Chronic low back pain. Had L4/5 fusion in her low back May 2020 which helped her sleep. Had PT following surgery which helped. Bowel and bladder are not perfect but improving. Had pelvic floor therapy which did not help. The bowel issues might have started after the back surgery. Feels like her urinary tract system quit due to the pain after back surgery. Was placed on a catheter for 6 months after the surgery. Denies saddle anesthesia. Pt also states having a kidney infection May 2022 and was diagnosed at the end of [place] 2022. Took all summer to get rid of the kidney infection which wiped her out and weakned her back.   PRECAUTIONS: No known precautions  SUBJECTIVE:  Pt reports 3/10 NPS currently. Reports mornings are better than evenings.    PAIN:  Are you having pain? 2 to 3/10 low back pain currently     TODAY'S TREATMENT:  Objective    Latex allergies  12/01/21 Therapeutic exercise    Seated trunk flexion on physioball, x20 forwards, R/L deviations. VC's for slowing speed of exercise.   Seated B shoulder extension yellow band 10x5 seconds, then 5x5 seconds for 2 sets to promote trunk strength   Seated Scap retractions RTB:  Hook lying:   Post tilts: x20, 5 sec holds  Bridges + post tilt: 3x8. Difficulty with gluts clearing mat table.   Resisted clam shell: RTB, 3x8, 2-3 sec holds. 2x8, progressed to GTB: 2x8, 2-3 sec holds    Forward and lateral side steps with 4" step. Ranging from [place] for light balance. Supervision. X10/LE per direction  Prone lumbar extensions for paraspinal strengthening: x12    Improved exercise technique, movement at target joints, use of target muscles after mod verbal, visual, tactile cues.       Response to treatment Tolerated without exacerbation of pain in low back.    Clinical impression Continuing PT POC with focus on postural,  abdominal and LE strengthening. Pt tolerating progression of resistance without aggravation to lower back for periscapulars and glut/quad exercises. Introduced prone lumbar extensions for paraspinal strengthening. Pt will continue to benefit from skilled PT services to progress strengthening and decreased pain to improve function.          PATIENT EDUCATION: Education details: there-ex, HEP Person educated: Patient Education method: Explanation, Demonstration, Tactile cues, Verbal cues, and Handouts Education comprehension: verbalized understanding and returned demonstration   HOME EXERCISE PROGRAM: Access Code: [email or web address] URL: [email or web address] Date: 08/04/2021 Prepared by: Joneen Boers  Exercises - Seated Transversus Abdominis Bracing  - 5 x daily - 7 x weekly - 3 sets - 10 reps - 5 seconds hold - Seated Trunk Rotation - Arms Crossed  - 1 x daily - 7 x weekly - 3 sets - 10 reps - 5 seconds  hold - Standing Gluteal Sets  - 1 x daily - 7 x weekly - 3 sets - 10 reps -  5 seconds hold - Supine Double Knee to Chest  - 1 x daily - 7 x weekly - 2 sets - 15 reps (Discontinued 08/06/2021 secondary to increased symptoms)  - Seated Sciatic Tensioner  - 1 x daily - 7 x weekly - 2 sets - 15 reps (Discontinued 3/303/2023 due to increased symptoms.) - Supine Posterior Pelvic Tilt  - 1 x daily - 7 x weekly - 3 sets - 10 reps - 5 seconds hold -Reclined Hip extension isometrics, leg straight to promote glute max muscle strengthening                                     R 10x5 seconds for 3 sets                                     L 10x5 seconds for 3 sets - Reclined Single Knee to Chest Stretch  - 1 x daily - 7 x weekly - 3 sets - 10 reps - 5 seconds hold     PT Short Term Goals - 08/11/21 1152       PT SHORT TERM GOAL #1   Title Pt will be independent with her initial HEP to decrease pain, improve strength and ability to ambulate longer distances.    Baseline Pt has started her  initial HEP (07/09/2021); Has been doing her HEP. Does not have questions (08/11/2021)    Time 3    Period Weeks    Status Achieved    Target Date 07/30/21             Salem Caster. Fairly IV, PT, DPT Physical Therapist- [facility]  12/01/2021, 11:00 AM

## 2021-12-03 ENCOUNTER — Ambulatory Visit: Payer: Medicare Other

## 2021-12-03 DIAGNOSIS — R262 Difficulty in walking, not elsewhere classified: Secondary | ICD-10-CM

## 2021-12-03 DIAGNOSIS — M6281 Muscle weakness (generalized): Secondary | ICD-10-CM

## 2021-12-03 DIAGNOSIS — M545 Low back pain, unspecified: Secondary | ICD-10-CM

## 2021-12-03 DIAGNOSIS — M5416 Radiculopathy, lumbar region: Secondary | ICD-10-CM

## 2021-12-03 NOTE — Therapy (Signed)
OUTPATIENT PHYSICAL THERAPY TREATMENT NOTE     Patient Name: Julia Deleon MRN: [id number] DOB:1945/10/14, 76 y.o., female Today's Date: 12/03/2021  PCP: Charlynne Cousins, MD REFERRING PROVIDER: Diamond Nickel, DO   PT End of Session - 12/03/21 0936     Visit Number 43    Number of Visits 6    Date for PT Re-Evaluation 12/31/21    Authorization Type Medicare Part A&B    Authorization Time Period 11/03/21-01/03/22    PT Start Time 0936    PT Stop Time 1017    PT Time Calculation (min) 41 min    Activity Tolerance Patient tolerated treatment well    Behavior During Therapy WFL for tasks assessed/performed                                      Past Medical History:  Diagnosis Date   Arthritis    Chronic kidney disease    COPD (chronic obstructive pulmonary disease) ([place])    pt denies    Dyspnea    Family history of adverse reaction to anesthesia    father was slow to wake up   Fuch's endothelial dystrophy    GERD (gastroesophageal reflux disease)    Hyperlipidemia    Hypertension    Hypothyroidism    Pneumonia    hx of    PONV (postoperative nausea and vomiting)    slow to wake up and PONV   Past Surgical History:  Procedure Laterality Date   ABDOMINAL HYSTERECTOMY     APPENDECTOMY     BACK SURGERY  09/26/2018   L4-5 PLIF by Dr. Arnoldo Morale   CARDIAC CATHETERIZATION     CHOLECYSTECTOMY     COLONOSCOPY WITH PROPOFOL N/A 10/23/2020   Procedure: COLONOSCOPY WITH PROPOFOL;  Surgeon: Virgel Manifold, MD;  Location: [facility] ENDOSCOPY;  Service: Endoscopy;  Laterality: N/A;   ESOPHAGOGASTRODUODENOSCOPY N/A 10/23/2020   Procedure: ESOPHAGOGASTRODUODENOSCOPY (EGD);  Surgeon: Virgel Manifold, MD;  Location: [facility] ENDOSCOPY;  Service: Endoscopy;  Laterality: N/A;   EYE SURGERY     FOOT SURGERY Right    pin removed left   laser vein surgery     NASAL SINUS SURGERY     RIGHT/LEFT HEART CATH AND CORONARY ANGIOGRAPHY N/A 12/06/2017   Procedure:  RIGHT/LEFT HEART CATH AND CORONARY ANGIOGRAPHY;  Surgeon: Yolonda Kida, MD;  Location: [place] [facility];  Service: Cardiovascular;  Laterality: N/A;   ROBOTIC ASSISTED LAPAROSCOPIC SACROCOLPOPEXY Bilateral 07/30/2020   Procedure: XI ROBOTIC ASSISTED LAPAROSCOPIC SACROCOLPOPEXY AND SUPRACERVICAL HYSTERECTOMY WITH BILATERAL SALPINGO OOPHERECTOMY;  Surgeon: Ardis Hughs, MD;  Location: [facility];  Service: Urology;  Laterality: Bilateral;  REQUESTING 4 HRS   TONSILLECTOMY     Patient Active Problem List   Diagnosis Date Noted   Essential hypertension 01/23/2021   Cystocele with prolapse 07/30/2020   Postsurgical arthrodesis status 07/25/2020   Facet arthropathy 04/08/2020   Family history of breast cancer 11/16/2019   Body mass index (BMI) 25.0-25.9, adult 07/24/2019   Chronic UTI 12/06/2018   Status post lumbar surgery 12/06/2018   Spondylolisthesis, lumbar region 09/26/2018   Aortic atherosclerosis ([place]) 08/31/2018   Food sensitivity headache 03/29/2018   CAD (coronary artery disease) 02/23/2018   Advanced care planning/counseling discussion 03/28/2017   Varicose veins of both lower extremities with complications 16/96/7893   Arthritis 12/09/2016   Vaginal atrophy 09/10/2015   Vasomotor symptoms due to menopause 09/10/2015  Onychomycosis due to dermatophyte 04/07/2015   Bursitis of right shoulder 02/26/2015   Environmental and seasonal allergies 02/26/2015   Other allergic rhinitis 02/26/2015   Osteoporosis 11/26/2014   COPD (chronic obstructive pulmonary disease) ([place]) 11/26/2014   Fuchs' corneal dystrophy 11/26/2014   Benign hypertensive renal disease 11/26/2014   Hyperlipidemia 11/26/2014   Chronic kidney disease, stage III (moderate) ([place]) 11/26/2014   Hypothyroidism 11/26/2014   Endothelial corneal dystrophy 11/26/2014     REFERRING DIAG: Chronic L low back pain  THERAPY DIAG:  Chronic bilateral low back pain, unspecified whether sciatica present  Muscle  weakness (generalized)  Difficulty in walking, not elsewhere classified  Radiculopathy, lumbar region  PERTINENT HISTORY: Chronic low back pain. Had L4/5 fusion in her low back May 2020 which helped her sleep. Had PT following surgery which helped. Bowel and bladder are not perfect but improving. Had pelvic floor therapy which did not help. The bowel issues might have started after the back surgery. Feels like her urinary tract system quit due to the pain after back surgery. Was placed on a catheter for 6 months after the surgery. Denies saddle anesthesia. Pt also states having a kidney infection May 2022 and was diagnosed at the end of [place] 2022. Took all summer to get rid of the kidney infection which wiped her out and weakned her back.   PRECAUTIONS: No known precautions  SUBJECTIVE:  Back is about 2-3/10 currently. Always great in the morning. Wants to do more aggressive PT.   PAIN:  Are you having pain? 2 to 3/10 low back pain currently     TODAY'S TREATMENT:  Objective     Latex allergies     Therapeutic exercise     Lumbar AROM all plane  Standing R trunk side bend 5x10 seconds  Standing hip extension yellow band with B UE assist   R 10x3  L 10x3  Standing hip extension yellow band with B UE assist   R 10x2  L 10x2   Less difficulty for L LE  Standing B scapular retraction with shoulder extension yellow band 10x5 seconds for 2 sets   Standing with B UE assist, one foot on 2nd stair step with glute max squeeze to promote stretch of hip flexor muscles on stance LE  Stance LE: R 10x5 seconds for 3 sets    L 10x5 seconds for 3 sets     Improved exercise technique, movement at target joints, use of target muscles after mod verbal, visual, tactile cues.          Response to treatment   Fair tolerance to today's session. No back pain, just tired and soreness. Has B thigh tightness.     Clinical impression  Focused more on strengthening today to  improve function at home and ability to prepare supper standing in her kitchen with less back pain. Fair tolerance to today's session.  Pt will benefit from continued skilled physical therapy services to decrease pain, improve strength and function.         PATIENT EDUCATION: Education details: there-ex, HEP Person educated: Patient Education method: Explanation, Demonstration, Tactile cues, Verbal cues, and Handouts Education comprehension: verbalized understanding and returned demonstration   HOME EXERCISE PROGRAM: Access Code: [email or web address] URL: [email or web address] Date: 08/04/2021 Prepared by: Joneen Boers  Exercises - Seated Transversus Abdominis Bracing  - 5 x daily - 7 x weekly - 3 sets - 10 reps - 5 seconds hold - Seated Trunk Rotation - Arms Crossed  - 1 x daily -  7 x weekly - 3 sets - 10 reps - 5 seconds  hold - Standing Gluteal Sets  - 1 x daily - 7 x weekly - 3 sets - 10 reps - 5 seconds hold - Supine Double Knee to Chest  - 1 x daily - 7 x weekly - 2 sets - 15 reps (Discontinued 08/06/2021 secondary to increased symptoms)  - Seated Sciatic Tensioner  - 1 x daily - 7 x weekly - 2 sets - 15 reps (Discontinued 3/303/2023 due to increased symptoms.) - Supine Posterior Pelvic Tilt  - 1 x daily - 7 x weekly - 3 sets - 10 reps - 5 seconds hold -Reclined Hip extension isometrics, leg straight to promote glute max muscle strengthening                                     R 10x5 seconds for 3 sets                                     L 10x5 seconds for 3 sets - Reclined Single Knee to Chest Stretch  - 1 x daily - 7 x weekly - 3 sets - 10 reps - 5 seconds hold   Standing with B UE assist, one foot on 2nd stair step with glute max squeeze to promote stretch of hip flexor muscles on stance LE  Stance LE: R 10x5 seconds for 3 sets    L 10x5 seconds for 3 sets     PT Short Term Goals - 08/11/21 1152       PT SHORT TERM GOAL #1   Title Pt will be independent with her  initial HEP to decrease pain, improve strength and ability to ambulate longer distances.    Baseline Pt has started her initial HEP (07/09/2021); Has been doing her HEP. Does not have questions (08/11/2021)    Time 3    Period Weeks    Status Achieved    Target Date 07/30/21              PT Long Term Goals - 11/03/21 1023       PT LONG TERM GOAL #1   Title Pt will have a decrease in low back pain to 4/10 or less at worst to promote ability to ambulate longer distances as well as perform standing tasks more comfortably.    Baseline 9/10 low back pain at most for the past 3 months. Pt also demonstrates B L5 radiating symptoms to knees (07/09/2021); 7-8/10 at most for the past 7 days, usually around 5 pm and later (08/11/2021); 7-8/10 at most for the past 7 days, usually in the evening. (09/01/2021); 7.5/10 back pain at most  for the past 7 days (09/24/2021); 10/10 last Friday (other than that, 7-8/10) ( 11/03/2021)    Time 8    Period Weeks    Status On-going    Target Date 12/29/21      PT LONG TERM GOAL #2   Title Pt will improve B hip extension, abduction and ER strength by at least 1/2 MMT grade to promote ability to perform standing tasks with less back pain.    Baseline Hip extension 4+/5 R, 3+/5 L, hip abduction 4/5 R,  4-/5 L, hip ER 4-/5 R, 3+/5 L (07/09/2021); hip extensoin 4+/5 R, 5/5 L, hip abduction 4+/5  R and L, hip ER 4/5 R, 4/5 L (08/11/2021); hip extension 5/5 R and L, hip abduction 4+/5 R and L, hip ER 4/5 R and L (09/01/2021); hip extension 5/5 R and L, hip abduction 4+/5 R and L, hip ER 4/5 R and L, pt states L knee is the side that needs a knee replacement (09/24/2021); hip extension 5/5 R, 4+/5 L, hip abduction 4+/5 R, and 4/5 L, hip ER 4/5 R, 4/5 L (11/03/2021)    Time 8    Period Weeks    Status Achieved    Target Date 10/29/21      PT LONG TERM GOAL #3   Title Pt will improve her lumbar FOTO score by at least 10 points as a demonstration of improved function.    Baseline Lumbar  Spine FOTO 44 (07/09/2021); Lumbar FOTO 40. Pt might have anwered her back function limitation question differently secondary to pt stating more limited in function but that was before starting PT. Might have answered increased limitation in function during today's session since starting PT instead which may have decrease her score. (08/11/2021).  47 (09/01/2021); 42 (09/24/2021)    Time 8    Period Weeks    Status On-going    Target Date 12/31/21              Plan - 12/03/21 1304     Clinical Impression Statement Focused more on strengthening today to improve function at home and ability to prepare supper standing in her kitchen with less back pain. Fair tolerance to today's session.  Pt will benefit from continued skilled physical therapy services to decrease pain, improve strength and function.    Personal Factors and Comorbidities Age;Comorbidity 3+;Fitness;Past/Current Experience;Time since onset of injury/illness/exacerbation    Comorbidities B knee pain, arthritis, chronic kidney disease, COPD, dyspnea, HTN    Examination-Activity Limitations Squat;Lift;Stairs;Locomotion Level;Stand;Reach Overhead;Carry    Stability/Clinical Decision Making Stable/Uncomplicated    Rehab Potential Fair    PT Frequency 2x / week    PT Duration 8 weeks    PT Treatment/Interventions Therapeutic activities;Therapeutic exercise;Balance training;Neuromuscular re-education;Patient/family education;Manual techniques;Dry needling;Aquatic Therapy;Electrical Stimulation;Iontophoresis '4mg'$ /ml Dexamethasone    PT Next Visit Plan Posture, trunk and hip strengthening, manual techniques, modalities PRN    PT Home Exercise Plan Medbridge Access Code [email or web address]    Consulted and Agree with Plan of Care Patient                                    Joneen Boers PT, DPT  12/03/2021, 6:53 PM

## 2021-12-07 ENCOUNTER — Ambulatory Visit: Payer: Medicare Other

## 2021-12-07 DIAGNOSIS — M545 Low back pain, unspecified: Secondary | ICD-10-CM | POA: Diagnosis not present

## 2021-12-07 DIAGNOSIS — M5416 Radiculopathy, lumbar region: Secondary | ICD-10-CM

## 2021-12-07 DIAGNOSIS — G8929 Other chronic pain: Secondary | ICD-10-CM

## 2021-12-07 DIAGNOSIS — M6281 Muscle weakness (generalized): Secondary | ICD-10-CM

## 2021-12-07 DIAGNOSIS — R262 Difficulty in walking, not elsewhere classified: Secondary | ICD-10-CM

## 2021-12-07 NOTE — Therapy (Signed)
OUTPATIENT PHYSICAL THERAPY TREATMENT NOTE     Patient Name: Julia Deleon MRN: [id number] DOB:1945/06/24, 76 y.o., female Today's Date: 12/07/2021  PCP: Charlynne Cousins, MD REFERRING PROVIDER: Diamond Nickel, DO   PT End of Session - 12/07/21 0934     Visit Number 9    Number of Visits 55    Date for PT Re-Evaluation 12/31/21    Authorization Type Medicare Part A&B    Authorization Time Period 11/03/21-01/03/22    PT Start Time 0934    PT Stop Time 1015    PT Time Calculation (min) 41 min    Activity Tolerance Patient tolerated treatment well    Behavior During Therapy WFL for tasks assessed/performed                                       Past Medical History:  Diagnosis Date   Arthritis    Chronic kidney disease    COPD (chronic obstructive pulmonary disease) ([place])    pt denies    Dyspnea    Family history of adverse reaction to anesthesia    father was slow to wake up   Fuch's endothelial dystrophy    GERD (gastroesophageal reflux disease)    Hyperlipidemia    Hypertension    Hypothyroidism    Pneumonia    hx of    PONV (postoperative nausea and vomiting)    slow to wake up and PONV   Past Surgical History:  Procedure Laterality Date   ABDOMINAL HYSTERECTOMY     APPENDECTOMY     BACK SURGERY  09/26/2018   L4-5 PLIF by Dr. Arnoldo Morale   CARDIAC CATHETERIZATION     CHOLECYSTECTOMY     COLONOSCOPY WITH PROPOFOL N/A 10/23/2020   Procedure: COLONOSCOPY WITH PROPOFOL;  Surgeon: Virgel Manifold, MD;  Location: [facility] ENDOSCOPY;  Service: Endoscopy;  Laterality: N/A;   ESOPHAGOGASTRODUODENOSCOPY N/A 10/23/2020   Procedure: ESOPHAGOGASTRODUODENOSCOPY (EGD);  Surgeon: Virgel Manifold, MD;  Location: [facility] ENDOSCOPY;  Service: Endoscopy;  Laterality: N/A;   EYE SURGERY     FOOT SURGERY Right    pin removed left   laser vein surgery     NASAL SINUS SURGERY     RIGHT/LEFT HEART CATH AND CORONARY ANGIOGRAPHY N/A 12/06/2017   Procedure:  RIGHT/LEFT HEART CATH AND CORONARY ANGIOGRAPHY;  Surgeon: Yolonda Kida, MD;  Location: [place] [facility];  Service: Cardiovascular;  Laterality: N/A;   ROBOTIC ASSISTED LAPAROSCOPIC SACROCOLPOPEXY Bilateral 07/30/2020   Procedure: XI ROBOTIC ASSISTED LAPAROSCOPIC SACROCOLPOPEXY AND SUPRACERVICAL HYSTERECTOMY WITH BILATERAL SALPINGO OOPHERECTOMY;  Surgeon: Ardis Hughs, MD;  Location: [facility];  Service: Urology;  Laterality: Bilateral;  REQUESTING 4 HRS   TONSILLECTOMY     Patient Active Problem List   Diagnosis Date Noted   Essential hypertension 01/23/2021   Cystocele with prolapse 07/30/2020   Postsurgical arthrodesis status 07/25/2020   Facet arthropathy 04/08/2020   Family history of breast cancer 11/16/2019   Body mass index (BMI) 25.0-25.9, adult 07/24/2019   Chronic UTI 12/06/2018   Status post lumbar surgery 12/06/2018   Spondylolisthesis, lumbar region 09/26/2018   Aortic atherosclerosis ([place]) 08/31/2018   Food sensitivity headache 03/29/2018   CAD (coronary artery disease) 02/23/2018   Advanced care planning/counseling discussion 03/28/2017   Varicose veins of both lower extremities with complications 25/42/7062   Arthritis 12/09/2016   Vaginal atrophy 09/10/2015   Vasomotor symptoms due to menopause 09/10/2015  Onychomycosis due to dermatophyte 04/07/2015   Bursitis of right shoulder 02/26/2015   Environmental and seasonal allergies 02/26/2015   Other allergic rhinitis 02/26/2015   Osteoporosis 11/26/2014   COPD (chronic obstructive pulmonary disease) ([place]) 11/26/2014   Fuchs' corneal dystrophy 11/26/2014   Benign hypertensive renal disease 11/26/2014   Hyperlipidemia 11/26/2014   Chronic kidney disease, stage III (moderate) ([place]) 11/26/2014   Hypothyroidism 11/26/2014   Endothelial corneal dystrophy 11/26/2014     REFERRING DIAG: Chronic L low back pain  THERAPY DIAG:  Chronic bilateral low back pain, unspecified whether sciatica present  Muscle  weakness (generalized)  Difficulty in walking, not elsewhere classified  Radiculopathy, lumbar region  PERTINENT HISTORY: Chronic low back pain. Had L4/5 fusion in her low back May 2020 which helped her sleep. Had PT following surgery which helped. Bowel and bladder are not perfect but improving. Had pelvic floor therapy which did not help. The bowel issues might have started after the back surgery. Feels like her urinary tract system quit due to the pain after back surgery. Was placed on a catheter for 6 months after the surgery. Denies saddle anesthesia. Pt also states having a kidney infection May 2022 and was diagnosed at the end of [place] 2022. Took all summer to get rid of the kidney infection which wiped her out and weakned her back.   PRECAUTIONS: No known precautions  SUBJECTIVE:  Back is about a 3/10 currently. Major problem is the lack of strength.   PAIN:  Are you having pain? 3/10 low back pain currently     TODAY'S TREATMENT:  Objective     Latex allergies     Therapeutic exercise    Standing low rows red band 10x5 seconds for 3 sets to promote trunk strength   Give as part of HEP next visit if appropriate.     Standing hip extension yellow band with B UE assist   R 10x3  L 10x3  Standing hip abduction  yellow band with B UE assist   R 10x2  L 10x2   Standing with B UE assist, one foot on 2nd stair step with glute max squeeze to promote stretch of hip flexor muscles on stance LE  Stance LE: R 10x5 seconds for 3 sets    L 10x5 seconds for 3 sets  Standing pallof press yellow band  Resistance to the R 5x5 seconds for 3 sets  Resistance to the L 5x5 seconds for 3 sets   difficult  Sitting with upright posture  Gentle manual trunk perturbation all planes 1 minute x 3    Standing B scapular retraction with shoulder extension yellow band 10x5 seconds   Change to red next visit.   Give as part of HEP next visit if appropriate.      Improved  exercise technique, movement at target joints, use of target muscles after mod verbal, visual, tactile cues.          Response to treatment   Fair tolerance to today's session.    Clinical impression  Continued working on strengthening today to improve function at home and ability to prepare supper standing in her kitchen with less back pain. Weak core strength observed. Fair tolerance to today's session.  Pt will benefit from continued skilled physical therapy services to decrease pain, improve strength and function.         PATIENT EDUCATION: Education details: there-ex, HEP Person educated: Patient Education method: Explanation, Demonstration, Tactile cues, Verbal cues, and Handouts Education comprehension:  verbalized understanding and returned demonstration   HOME EXERCISE PROGRAM: Access Code: [email or web address] URL: [email or web address] Date: 08/04/2021 Prepared by: Joneen Boers  Exercises - Seated Transversus Abdominis Bracing  - 5 x daily - 7 x weekly - 3 sets - 10 reps - 5 seconds hold - Seated Trunk Rotation - Arms Crossed  - 1 x daily - 7 x weekly - 3 sets - 10 reps - 5 seconds  hold - Standing Gluteal Sets  - 1 x daily - 7 x weekly - 3 sets - 10 reps - 5 seconds hold - Supine Double Knee to Chest  - 1 x daily - 7 x weekly - 2 sets - 15 reps (Discontinued 08/06/2021 secondary to increased symptoms)  - Seated Sciatic Tensioner  - 1 x daily - 7 x weekly - 2 sets - 15 reps (Discontinued 3/303/2023 due to increased symptoms.) - Supine Posterior Pelvic Tilt  - 1 x daily - 7 x weekly - 3 sets - 10 reps - 5 seconds hold -Reclined Hip extension isometrics, leg straight to promote glute max muscle strengthening                                     R 10x5 seconds for 3 sets                                     L 10x5 seconds for 3 sets - Reclined Single Knee to Chest Stretch  - 1 x daily - 7 x weekly - 3 sets - 10 reps - 5 seconds hold   Standing with B UE assist, one  foot on 2nd stair step with glute max squeeze to promote stretch of hip flexor muscles on stance LE  Stance LE: R 10x5 seconds for 3 sets    L 10x5 seconds for 3 sets     PT Short Term Goals - 08/11/21 1152       PT SHORT TERM GOAL #1   Title Pt will be independent with her initial HEP to decrease pain, improve strength and ability to ambulate longer distances.    Baseline Pt has started her initial HEP (07/09/2021); Has been doing her HEP. Does not have questions (08/11/2021)    Time 3    Period Weeks    Status Achieved    Target Date 07/30/21              PT Long Term Goals - 11/03/21 1023       PT LONG TERM GOAL #1   Title Pt will have a decrease in low back pain to 4/10 or less at worst to promote ability to ambulate longer distances as well as perform standing tasks more comfortably.    Baseline 9/10 low back pain at most for the past 3 months. Pt also demonstrates B L5 radiating symptoms to knees (07/09/2021); 7-8/10 at most for the past 7 days, usually around 5 pm and later (08/11/2021); 7-8/10 at most for the past 7 days, usually in the evening. (09/01/2021); 7.5/10 back pain at most  for the past 7 days (09/24/2021); 10/10 last Friday (other than that, 7-8/10) ( 11/03/2021)    Time 8    Period Weeks    Status On-going    Target Date 12/29/21      PT LONG TERM GOAL #2   Title  Pt will improve B hip extension, abduction and ER strength by at least 1/2 MMT grade to promote ability to perform standing tasks with less back pain.    Baseline Hip extension 4+/5 R, 3+/5 L, hip abduction 4/5 R,  4-/5 L, hip ER 4-/5 R, 3+/5 L (07/09/2021); hip extensoin 4+/5 R, 5/5 L, hip abduction 4+/5 R and L, hip ER 4/5 R, 4/5 L (08/11/2021); hip extension 5/5 R and L, hip abduction 4+/5 R and L, hip ER 4/5 R and L (09/01/2021); hip extension 5/5 R and L, hip abduction 4+/5 R and L, hip ER 4/5 R and L, pt states L knee is the side that needs a knee replacement (09/24/2021); hip extension 5/5 R, 4+/5 L, hip  abduction 4+/5 R, and 4/5 L, hip ER 4/5 R, 4/5 L (11/03/2021)    Time 8    Period Weeks    Status Achieved    Target Date 10/29/21      PT LONG TERM GOAL #3   Title Pt will improve her lumbar FOTO score by at least 10 points as a demonstration of improved function.    Baseline Lumbar Spine FOTO 44 (07/09/2021); Lumbar FOTO 40. Pt might have anwered her back function limitation question differently secondary to pt stating more limited in function but that was before starting PT. Might have answered increased limitation in function during today's session since starting PT instead which may have decrease her score. (08/11/2021).  47 (09/01/2021); 42 (09/24/2021)    Time 8    Period Weeks    Status On-going    Target Date 12/31/21              Plan - 12/07/21 0952     Clinical Impression Statement Continued working on strengthening today to improve function at home and ability to prepare supper standing in her kitchen with less back pain. Weak core strength observed. Fair tolerance to today's session.  Pt will benefit from continued skilled physical therapy services to decrease pain, improve strength and function.    Personal Factors and Comorbidities Age;Comorbidity 3+;Fitness;Past/Current Experience;Time since onset of injury/illness/exacerbation    Comorbidities B knee pain, arthritis, chronic kidney disease, COPD, dyspnea, HTN    Examination-Activity Limitations Squat;Lift;Stairs;Locomotion Level;Stand;Reach Overhead;Carry    Stability/Clinical Decision Making Stable/Uncomplicated    Rehab Potential Fair    PT Frequency 2x / week    PT Duration 8 weeks    PT Treatment/Interventions Therapeutic activities;Therapeutic exercise;Balance training;Neuromuscular re-education;Patient/family education;Manual techniques;Dry needling;Aquatic Therapy;Electrical Stimulation;Iontophoresis '4mg'$ /ml Dexamethasone    PT Next Visit Plan Posture, trunk and hip strengthening, manual techniques, modalities PRN     PT Home Exercise Plan Medbridge Access Code [email or web address]    Consulted and Agree with Plan of Care Patient                                     Joneen Boers PT, DPT  12/07/2021, 1:04 PM

## 2021-12-09 ENCOUNTER — Ambulatory Visit: Payer: Medicare Other | Attending: Sports Medicine

## 2021-12-09 DIAGNOSIS — M62838 Other muscle spasm: Secondary | ICD-10-CM | POA: Insufficient documentation

## 2021-12-09 DIAGNOSIS — M6281 Muscle weakness (generalized): Secondary | ICD-10-CM | POA: Insufficient documentation

## 2021-12-09 DIAGNOSIS — G8929 Other chronic pain: Secondary | ICD-10-CM | POA: Insufficient documentation

## 2021-12-09 DIAGNOSIS — R262 Difficulty in walking, not elsewhere classified: Secondary | ICD-10-CM | POA: Insufficient documentation

## 2021-12-09 DIAGNOSIS — M5416 Radiculopathy, lumbar region: Secondary | ICD-10-CM | POA: Insufficient documentation

## 2021-12-09 DIAGNOSIS — M545 Low back pain, unspecified: Secondary | ICD-10-CM | POA: Diagnosis not present

## 2021-12-09 NOTE — Therapy (Signed)
OUTPATIENT PHYSICAL THERAPY TREATMENT NOTE     Patient Name: Julia Deleon MRN: [id number] DOB:[birth year], 76 y.o., female Today's Date: 12/09/2021  PCP: Charlynne Cousins, MD REFERRING PROVIDER: Diamond Nickel, DO   PT End of Session - 12/09/21 1104     Visit Number [id number]    Number of Visits 58    Date for PT Re-Evaluation 12/31/21    Authorization Type Medicare Part A&B    Authorization Time Period 11/03/21-01/03/22    PT Start Time 1104    PT Stop Time 1148    PT Time Calculation (min) 44 min    Activity Tolerance Patient tolerated treatment well    Behavior During Therapy WFL for tasks assessed/performed                                        Past Medical History:  Diagnosis Date   Arthritis    Chronic kidney disease    COPD (chronic obstructive pulmonary disease) ([place])    pt denies    Dyspnea    Family history of adverse reaction to anesthesia    father was slow to wake up   Fuch's endothelial dystrophy    GERD (gastroesophageal reflux disease)    Hyperlipidemia    Hypertension    Hypothyroidism    Pneumonia    hx of    PONV (postoperative nausea and vomiting)    slow to wake up and PONV   Past Surgical History:  Procedure Laterality Date   ABDOMINAL HYSTERECTOMY     APPENDECTOMY     BACK SURGERY  09/26/2018   L4-5 PLIF by Dr. Arnoldo Morale   CARDIAC CATHETERIZATION     CHOLECYSTECTOMY     COLONOSCOPY WITH PROPOFOL N/A 10/23/2020   Procedure: COLONOSCOPY WITH PROPOFOL;  Surgeon: Virgel Manifold, MD;  Location: [facility] ENDOSCOPY;  Service: Endoscopy;  Laterality: N/A;   ESOPHAGOGASTRODUODENOSCOPY N/A 10/23/2020   Procedure: ESOPHAGOGASTRODUODENOSCOPY (EGD);  Surgeon: Virgel Manifold, MD;  Location: [facility] ENDOSCOPY;  Service: Endoscopy;  Laterality: N/A;   EYE SURGERY     FOOT SURGERY Right    pin removed left   laser vein surgery     NASAL SINUS SURGERY     RIGHT/LEFT HEART CATH AND CORONARY ANGIOGRAPHY N/A 12/06/2017   Procedure:  RIGHT/LEFT HEART CATH AND CORONARY ANGIOGRAPHY;  Surgeon: Yolonda Kida, MD;  Location: [facility];  Service: Cardiovascular;  Laterality: N/A;   ROBOTIC ASSISTED LAPAROSCOPIC SACROCOLPOPEXY Bilateral 07/30/2020   Procedure: XI ROBOTIC ASSISTED LAPAROSCOPIC SACROCOLPOPEXY AND SUPRACERVICAL HYSTERECTOMY WITH BILATERAL SALPINGO OOPHERECTOMY;  Surgeon: Ardis Hughs, MD;  Location: [facility];  Service: Urology;  Laterality: Bilateral;  REQUESTING 4 HRS   TONSILLECTOMY     Patient Active Problem List   Diagnosis Date Noted   Essential hypertension 01/23/2021   Cystocele with prolapse 07/30/2020   Postsurgical arthrodesis status 07/25/2020   Facet arthropathy 04/08/2020   Family history of breast cancer 11/16/2019   Body mass index (BMI) 25.0-25.9, adult 07/24/2019   Chronic UTI 12/06/2018   Status post lumbar surgery 12/06/2018   Spondylolisthesis, lumbar region 09/26/2018   Aortic atherosclerosis ([place]) 08/31/2018   Food sensitivity headache 03/29/2018   CAD (coronary artery disease) 02/23/2018   Advanced care planning/counseling discussion 03/28/2017   Varicose veins of both lower extremities with complications 66/29/4765   Arthritis 12/09/2016   Vaginal atrophy 09/10/2015   Vasomotor symptoms due to menopause  09/10/2015   Onychomycosis due to dermatophyte 04/07/2015   Bursitis of right shoulder 02/26/2015   Environmental and seasonal allergies 02/26/2015   Other allergic rhinitis 02/26/2015   Osteoporosis 11/26/2014   COPD (chronic obstructive pulmonary disease) ([place]) 11/26/2014   Fuchs' corneal dystrophy 11/26/2014   Benign hypertensive renal disease 11/26/2014   Hyperlipidemia 11/26/2014   Chronic kidney disease, stage III (moderate) ([place]) 11/26/2014   Hypothyroidism 11/26/2014   Endothelial corneal dystrophy 11/26/2014     REFERRING DIAG: Chronic L low back pain  THERAPY DIAG:  Chronic bilateral low back pain, unspecified whether sciatica present  Muscle  weakness (generalized)  Difficulty in walking, not elsewhere classified  Radiculopathy, lumbar region  PERTINENT HISTORY: Chronic low back pain. Had L4/5 fusion in her low back May 2020 which helped her sleep. Had PT following surgery which helped. Bowel and bladder are not perfect but improving. Had pelvic floor therapy which did not help. The bowel issues might have started after the back surgery. Feels like her urinary tract system quit due to the pain after back surgery. Was placed on a catheter for 6 months after the surgery. Denies saddle anesthesia. Pt also states having a kidney infection May 2022 and was diagnosed at the end of [place] 2022. Took all summer to get rid of the kidney infection which wiped her out and weakned her back.   PRECAUTIONS: No known precautions  SUBJECTIVE:  Back is about a 2-3/10 currently.   PAIN:  Are you having pain? 2-3/10 low back pain currently     TODAY'S TREATMENT:  Objective     Latex allergies     Therapeutic exercise    Standing low rows red band 10x5 seconds for 3 sets to promote trunk strength   Give as part of HEP next visit if appropriate.     Standing hip extension yellow band with B UE assist   R 10x3  L 10x3  Standing hip abduction  yellow band with B UE assist   R 10x2  L 10x2  Standing pallof press yellow band  Resistance to the R 5x5 seconds for 3 sets  Resistance to the L 5x5 seconds for 3 sets   Difficult   Standing with B UE assist, one foot on 2nd stair step with glute max squeeze to promote stretch of hip flexor muscles on stance LE  Stance LE: R 10x5 seconds for 3 sets    L 10x5 seconds for 3 sets   Omega machine  Rows, plate 10 for 4x. Difficult   Then plate 5 for 5x2     Improved exercise technique, movement at target joints, use of target muscles after mod verbal, visual, tactile cues.    Manual therapy Seated STM to B lumbar paraspinal muscles to decrease tension   Decreased low back pain  and B LE tightness afterwards reported.     Response to treatment  Decreased low back pain and B LE tightness after manual therapy   Clinical impression  Continued working on improving trunk and hip strengthening in closed chain positions to promote ability to prepare supper while standing in the kitchen more comfortably. Demonstrates increased lumbar paraspinal muscle use. Decreased low back pain and B LE tightness after treatment to decrease tension to aforementioned muscles. Pt will benefit from continued skilled physical therapy services to decrease pain, improve strength and function.         PATIENT EDUCATION: Education details: there-ex, HEP Person educated: Patient Education method: Explanation, Demonstration, Tactile cues, Verbal  cues, and Handouts Education comprehension: verbalized understanding and returned demonstration   HOME EXERCISE PROGRAM: Access Code: [email or web address] URL: [email or web address] Date: 08/04/2021 Prepared by: Joneen Boers  Exercises - Seated Transversus Abdominis Bracing  - 5 x daily - 7 x weekly - 3 sets - 10 reps - 5 seconds hold - Seated Trunk Rotation - Arms Crossed  - 1 x daily - 7 x weekly - 3 sets - 10 reps - 5 seconds  hold - Standing Gluteal Sets  - 1 x daily - 7 x weekly - 3 sets - 10 reps - 5 seconds hold - Supine Double Knee to Chest  - 1 x daily - 7 x weekly - 2 sets - 15 reps (Discontinued 08/06/2021 secondary to increased symptoms)  - Seated Sciatic Tensioner  - 1 x daily - 7 x weekly - 2 sets - 15 reps (Discontinued 3/303/2023 due to increased symptoms.) - Supine Posterior Pelvic Tilt  - 1 x daily - 7 x weekly - 3 sets - 10 reps - 5 seconds hold -Reclined Hip extension isometrics, leg straight to promote glute max muscle strengthening                                     R 10x5 seconds for 3 sets                                     L 10x5 seconds for 3 sets - Reclined Single Knee to Chest Stretch  - 1 x daily - 7 x weekly -  3 sets - 10 reps - 5 seconds hold   Standing with B UE assist, one foot on 2nd stair step with glute max squeeze to promote stretch of hip flexor muscles on stance LE  Stance LE: R 10x5 seconds for 3 sets    L 10x5 seconds for 3 sets  - Standing Bilateral Low Shoulder Row with Anchored Resistance  - 1 x daily - 7 x weekly - 3 sets - 10 reps - 5 seconds hold       PT Short Term Goals - 08/11/21 1152       PT SHORT TERM GOAL #1   Title Pt will be independent with her initial HEP to decrease pain, improve strength and ability to ambulate longer distances.    Baseline Pt has started her initial HEP (07/09/2021); Has been doing her HEP. Does not have questions (08/11/2021)    Time 3    Period Weeks    Status Achieved    Target Date 07/30/21              PT Long Term Goals - 11/03/21 1023       PT LONG TERM GOAL #1   Title Pt will have a decrease in low back pain to 4/10 or less at worst to promote ability to ambulate longer distances as well as perform standing tasks more comfortably.    Baseline 9/10 low back pain at most for the past 3 months. Pt also demonstrates B L5 radiating symptoms to knees (07/09/2021); 7-8/10 at most for the past 7 days, usually around 5 pm and later (08/11/2021); 7-8/10 at most for the past 7 days, usually in the evening. (09/01/2021); 7.5/10 back pain at most  for the past 7 days (09/24/2021); 10/10 last Friday (other than that, 7-8/10) (  11/03/2021)    Time 8    Period Weeks    Status On-going    Target Date 12/29/21      PT LONG TERM GOAL #2   Title Pt will improve B hip extension, abduction and ER strength by at least 1/2 MMT grade to promote ability to perform standing tasks with less back pain.    Baseline Hip extension 4+/5 R, 3+/5 L, hip abduction 4/5 R,  4-/5 L, hip ER 4-/5 R, 3+/5 L (07/09/2021); hip extensoin 4+/5 R, 5/5 L, hip abduction 4+/5 R and L, hip ER 4/5 R, 4/5 L (08/11/2021); hip extension 5/5 R and L, hip abduction 4+/5 R and L, hip ER 4/5 R and  L (09/01/2021); hip extension 5/5 R and L, hip abduction 4+/5 R and L, hip ER 4/5 R and L, pt states L knee is the side that needs a knee replacement (09/24/2021); hip extension 5/5 R, 4+/5 L, hip abduction 4+/5 R, and 4/5 L, hip ER 4/5 R, 4/5 L (11/03/2021)    Time 8    Period Weeks    Status Achieved    Target Date 10/29/21      PT LONG TERM GOAL #3   Title Pt will improve her lumbar FOTO score by at least 10 points as a demonstration of improved function.    Baseline Lumbar Spine FOTO 44 (07/09/2021); Lumbar FOTO 40. Pt might have anwered her back function limitation question differently secondary to pt stating more limited in function but that was before starting PT. Might have answered increased limitation in function during today's session since starting PT instead which may have decrease her score. (08/11/2021).  47 (09/01/2021); 42 (09/24/2021)    Time 8    Period Weeks    Status On-going    Target Date 12/31/21              Plan - 12/09/21 1104     Clinical Impression Statement Continued working on improving trunk and hip strengthening in closed chain positions to promote ability to prepare supper while standing in the kitchen more comfortably. Demonstrates increased lumbar paraspinal muscle use. Decreased low back pain and B LE tightness after treatment to decrease tension to aforementioned muscles. Pt will benefit from continued skilled physical therapy services to decrease pain, improve strength and function.    Personal Factors and Comorbidities Age;Comorbidity 3+;Fitness;Past/Current Experience;Time since onset of injury/illness/exacerbation    Comorbidities B knee pain, arthritis, chronic kidney disease, COPD, dyspnea, HTN    Examination-Activity Limitations Squat;Lift;Stairs;Locomotion Level;Stand;Reach Overhead;Carry    Stability/Clinical Decision Making Stable/Uncomplicated    Rehab Potential Fair    PT Frequency 2x / week    PT Duration 8 weeks    PT Treatment/Interventions  Therapeutic activities;Therapeutic exercise;Balance training;Neuromuscular re-education;Patient/family education;Manual techniques;Dry needling;Aquatic Therapy;Electrical Stimulation;Iontophoresis '4mg'$ /ml Dexamethasone    PT Next Visit Plan Posture, trunk and hip strengthening, manual techniques, modalities PRN    PT Home Exercise Plan Medbridge Access Code [email or web address]    Consulted and Agree with Plan of Care Patient                                      Joneen Boers PT, DPT  12/09/2021, 12:26 PM

## 2021-12-15 ENCOUNTER — Ambulatory Visit: Payer: Medicare Other

## 2021-12-15 DIAGNOSIS — M545 Low back pain, unspecified: Secondary | ICD-10-CM | POA: Diagnosis not present

## 2021-12-15 DIAGNOSIS — M5416 Radiculopathy, lumbar region: Secondary | ICD-10-CM

## 2021-12-15 DIAGNOSIS — M6281 Muscle weakness (generalized): Secondary | ICD-10-CM

## 2021-12-15 DIAGNOSIS — R262 Difficulty in walking, not elsewhere classified: Secondary | ICD-10-CM

## 2021-12-15 NOTE — Therapy (Signed)
OUTPATIENT PHYSICAL THERAPY TREATMENT NOTE And Progress Report (11/03/2021 - 12/15/2021)     Patient Name: Julia Deleon MRN: [id number] DOB:10-03-1945, 76 y.o., female Today's Date: 12/15/2021  PCP: Charlynne Cousins, MD REFERRING PROVIDER: Diamond Nickel, DO   PT End of Session - 12/15/21 1018     Visit Number 40    Number of Visits 32    Date for PT Re-Evaluation 12/31/21    Authorization Type Medicare Part A&B    Authorization Time Period 11/03/21-01/03/22    PT Start Time 1018    PT Stop Time 1101    PT Time Calculation (min) 43 min    Activity Tolerance Patient tolerated treatment well    Behavior During Therapy WFL for tasks assessed/performed                                         Past Medical History:  Diagnosis Date   Arthritis    Chronic kidney disease    COPD (chronic obstructive pulmonary disease) ([place])    pt denies    Dyspnea    Family history of adverse reaction to anesthesia    father was slow to wake up   Fuch's endothelial dystrophy    GERD (gastroesophageal reflux disease)    Hyperlipidemia    Hypertension    Hypothyroidism    Pneumonia    hx of    PONV (postoperative nausea and vomiting)    slow to wake up and PONV   Past Surgical History:  Procedure Laterality Date   ABDOMINAL HYSTERECTOMY     APPENDECTOMY     BACK SURGERY  09/26/2018   L4-5 PLIF by Dr. Arnoldo Morale   CARDIAC CATHETERIZATION     CHOLECYSTECTOMY     COLONOSCOPY WITH PROPOFOL N/A 10/23/2020   Procedure: COLONOSCOPY WITH PROPOFOL;  Surgeon: Virgel Manifold, MD;  Location: [facility] ENDOSCOPY;  Service: Endoscopy;  Laterality: N/A;   ESOPHAGOGASTRODUODENOSCOPY N/A 10/23/2020   Procedure: ESOPHAGOGASTRODUODENOSCOPY (EGD);  Surgeon: Virgel Manifold, MD;  Location: [facility] ENDOSCOPY;  Service: Endoscopy;  Laterality: N/A;   EYE SURGERY     FOOT SURGERY Right    pin removed left   laser vein surgery     NASAL SINUS SURGERY     RIGHT/LEFT HEART CATH AND  CORONARY ANGIOGRAPHY N/A 12/06/2017   Procedure: RIGHT/LEFT HEART CATH AND CORONARY ANGIOGRAPHY;  Surgeon: Yolonda Kida, MD;  Location: [place];  Service: Cardiovascular;  Laterality: N/A;   ROBOTIC ASSISTED LAPAROSCOPIC SACROCOLPOPEXY Bilateral 07/30/2020   Procedure: XI ROBOTIC ASSISTED LAPAROSCOPIC SACROCOLPOPEXY AND SUPRACERVICAL HYSTERECTOMY WITH BILATERAL SALPINGO OOPHERECTOMY;  Surgeon: Ardis Hughs, MD;  Location: [facility];  Service: Urology;  Laterality: Bilateral;  REQUESTING 4 HRS   TONSILLECTOMY     Patient Active Problem List   Diagnosis Date Noted   Essential hypertension 01/23/2021   Cystocele with prolapse 07/30/2020   Postsurgical arthrodesis status 07/25/2020   Facet arthropathy 04/08/2020   Family history of breast cancer 11/16/2019   Body mass index (BMI) 25.0-25.9, adult 07/24/2019   Chronic UTI 12/06/2018   Status post lumbar surgery 12/06/2018   Spondylolisthesis, lumbar region 09/26/2018   Aortic atherosclerosis ([place]) 08/31/2018   Food sensitivity headache 03/29/2018   CAD (coronary artery disease) 02/23/2018   Advanced care planning/counseling discussion 03/28/2017   Varicose veins of both lower extremities with complications 56/81/2751   Arthritis 12/09/2016   Vaginal atrophy 09/10/2015  Vasomotor symptoms due to menopause 09/10/2015   Onychomycosis due to dermatophyte 04/07/2015   Bursitis of right shoulder 02/26/2015   Environmental and seasonal allergies 02/26/2015   Other allergic rhinitis 02/26/2015   Osteoporosis 11/26/2014   COPD (chronic obstructive pulmonary disease) ([place]) 11/26/2014   Fuchs' corneal dystrophy 11/26/2014   Benign hypertensive renal disease 11/26/2014   Hyperlipidemia 11/26/2014   Chronic kidney disease, stage III (moderate) ([place]) 11/26/2014   Hypothyroidism 11/26/2014   Endothelial corneal dystrophy 11/26/2014     REFERRING DIAG: Chronic L low back pain  THERAPY DIAG:  Chronic bilateral low back  pain, unspecified whether sciatica present  Muscle weakness (generalized)  Difficulty in walking, not elsewhere classified  Radiculopathy, lumbar region  PERTINENT HISTORY: Chronic low back pain. Had L4/5 fusion in her low back May 2020 which helped her sleep. Had PT following surgery which helped. Bowel and bladder are not perfect but improving. Had pelvic floor therapy which did not help. The bowel issues might have started after the back surgery. Feels like her urinary tract system quit due to the pain after back surgery. Was placed on a catheter for 6 months after the surgery. Denies saddle anesthesia. Pt also states having a kidney infection May 2022 and was diagnosed at the end of [place] 2022. Took all summer to get rid of the kidney infection which wiped her out and weakned her back.   PRECAUTIONS: No known precautions  SUBJECTIVE:  Back is about a 3/10 currently. It could be stronger. Feels like PT is helping. Feels like she is slowly getting stronger. Stubbed her L toe this past Sunday.   PAIN:  Are you having pain? 3/10 low back pain currently     TODAY'S TREATMENT:  Objective     Latex allergies     Therapeutic exercise    Standing low rows red band 10x5 seconds for 3 sets to promote trunk strength     Standing with B UE assist, one foot on 2nd stair step with glute max squeeze to promote stretch of hip flexor muscles on stance LE  Stance LE: R 10x5 seconds for 3 sets    L 10x5 seconds for 3 sets   Standing PNF chops red band   R 10x5 seconds   L 10x5 seconds   Challenging per pt  Standing hip abduction  yellow band with B UE assist   R 10x3  L 10x3  Standing hip extension yellow band with B UE assist   R 10x3  L 10x3     Improved exercise technique, movement at target joints, use of target muscles after mod verbal, visual, tactile cues.    Manual therapy   Seated STM to B lumbar paraspinal muscles to decrease tension   Decreased low back pain and  B LE tightness afterwards reported.     Response to treatment Fair tolerance to today's session. Good muscle use felt with exercises.   Clinical impression   Similar pain level for low back pain at worst compmared to previous measurements and slight decrease in FOTO score compared to previous measurement. Pt however states PT is helping and feels like she is slowly getting stronger. Wants to continue especially since it took a long time with her previous round of PT about 2 years ago. Continued focusing in strengthening exerciess to promote ability to perform standing tasks in the kitchen with less difficulty and back pain. Fair tolerance to today's session. Good muscle use felt with exercises. Pt will benefit from continued  skilled physical therapy services to decrease pain, improve strength and function.         PATIENT EDUCATION: Education details: there-ex, HEP Person educated: Patient Education method: Explanation, Demonstration, Tactile cues, Verbal cues, and Handouts Education comprehension: verbalized understanding and returned demonstration   HOME EXERCISE PROGRAM: Access Code: [email or web address] URL: [email or web address] Date: 08/04/2021 Prepared by: Joneen Boers  Exercises - Seated Transversus Abdominis Bracing  - 5 x daily - 7 x weekly - 3 sets - 10 reps - 5 seconds hold - Seated Trunk Rotation - Arms Crossed  - 1 x daily - 7 x weekly - 3 sets - 10 reps - 5 seconds  hold - Standing Gluteal Sets  - 1 x daily - 7 x weekly - 3 sets - 10 reps - 5 seconds hold - Supine Double Knee to Chest  - 1 x daily - 7 x weekly - 2 sets - 15 reps (Discontinued 08/06/2021 secondary to increased symptoms)  - Seated Sciatic Tensioner  - 1 x daily - 7 x weekly - 2 sets - 15 reps (Discontinued 3/303/2023 due to increased symptoms.) - Supine Posterior Pelvic Tilt  - 1 x daily - 7 x weekly - 3 sets - 10 reps - 5 seconds hold -Reclined Hip extension isometrics, leg straight to promote glute  max muscle strengthening                                     R 10x5 seconds for 3 sets                                     L 10x5 seconds for 3 sets - Reclined Single Knee to Chest Stretch  - 1 x daily - 7 x weekly - 3 sets - 10 reps - 5 seconds hold   Standing with B UE assist, one foot on 2nd stair step with glute max squeeze to promote stretch of hip flexor muscles on stance LE  Stance LE: R 10x5 seconds for 3 sets    L 10x5 seconds for 3 sets  - Standing Bilateral Low Shoulder Row with Anchored Resistance  - 1 x daily - 7 x weekly - 3 sets - 10 reps - 5 seconds hold       PT Short Term Goals - 08/11/21 1152       PT SHORT TERM GOAL #1   Title Pt will be independent with her initial HEP to decrease pain, improve strength and ability to ambulate longer distances.    Baseline Pt has started her initial HEP (07/09/2021); Has been doing her HEP. Does not have questions (08/11/2021)    Time 3    Period Weeks    Status Achieved    Target Date 07/30/21              PT Long Term Goals - 12/15/21 1020       PT LONG TERM GOAL #1   Title Pt will have a decrease in low back pain to 4/10 or less at worst to promote ability to ambulate longer distances as well as perform standing tasks more comfortably.    Baseline 9/10 low back pain at most for the past 3 months. Pt also demonstrates B L5 radiating symptoms to knees (07/09/2021); 7-8/10 at most for the past 7 days, usually around 5 pm  and later (08/11/2021); 7-8/10 at most for the past 7 days, usually in the evening. (09/01/2021); 7.5/10 back pain at most  for the past 7 days (09/24/2021); 10/10 last Friday (other than that, 7-8/10) ( 11/03/2021); 7-8/10 low back pain at most for the past 7 days; has been in the care a lot.  (12/15/2021)    Time 8    Period Weeks    Status On-going    Target Date 12/31/21      PT LONG TERM GOAL #2   Title Pt will improve B hip extension, abduction and ER strength by at least 1/2 MMT grade to promote ability to  perform standing tasks with less back pain.    Baseline Hip extension 4+/5 R, 3+/5 L, hip abduction 4/5 R,  4-/5 L, hip ER 4-/5 R, 3+/5 L (07/09/2021); hip extensoin 4+/5 R, 5/5 L, hip abduction 4+/5 R and L, hip ER 4/5 R, 4/5 L (08/11/2021); hip extension 5/5 R and L, hip abduction 4+/5 R and L, hip ER 4/5 R and L (09/01/2021); hip extension 5/5 R and L, hip abduction 4+/5 R and L, hip ER 4/5 R and L, pt states L knee is the side that needs a knee replacement (09/24/2021); hip extension 5/5 R, 4+/5 L, hip abduction 4+/5 R, and 4/5 L, hip ER 4/5 R, 4/5 L (11/03/2021)    Time 8    Period Weeks    Status Achieved    Target Date 10/29/21      PT LONG TERM GOAL #3   Title Pt will improve her lumbar FOTO score by at least 10 points as a demonstration of improved function.    Baseline Lumbar Spine FOTO 44 (07/09/2021); Lumbar FOTO 40. Pt might have anwered her back function limitation question differently secondary to pt stating more limited in function but that was before starting PT. Might have answered increased limitation in function during today's session since starting PT instead which may have decrease her score. (08/11/2021).  47 (09/01/2021); 42 (09/24/2021); 40 (12/15/2021)    Time 8    Period Weeks    Status On-going    Target Date 12/31/21              Plan - 12/15/21 1016     Clinical Impression Statement Similar pain level for low back pain at worst compmared to previous measurements and slight decrease in FOTO score compared to previous measurement. Pt however states PT is helping and feels like she is slowly getting stronger. Wants to continue especially since it took a long time with her previous round of PT about 2 years ago. Continued focusing in strengthening exerciess to promote ability to perform standing tasks in the kitchen with less difficulty and back pain. Fair tolerance to today's session. Good muscle use felt with exercises. Pt will benefit from continued skilled physical therapy  services to decrease pain, improve strength and function.    Personal Factors and Comorbidities Age;Comorbidity 3+;Fitness;Past/Current Experience;Time since onset of injury/illness/exacerbation    Comorbidities B knee pain, arthritis, chronic kidney disease, COPD, dyspnea, HTN    Examination-Activity Limitations Squat;Lift;Stairs;Locomotion Level;Stand;Reach Overhead;Carry    Stability/Clinical Decision Making Stable/Uncomplicated    Clinical Decision Making Low    Rehab Potential Fair    PT Frequency 2x / week    PT Duration 8 weeks    PT Treatment/Interventions Therapeutic activities;Therapeutic exercise;Balance training;Neuromuscular re-education;Patient/family education;Manual techniques;Dry needling;Aquatic Therapy;Electrical Stimulation;Iontophoresis '4mg'$ /ml Dexamethasone    PT Next Visit Plan Posture, trunk and hip strengthening, manual  techniques, modalities PRN    PT Home Exercise Plan Medbridge Access Code [email or web address]    Consulted and Agree with Plan of Care Patient                                    Thank you for your referral.    Joneen Boers PT, DPT  12/15/2021, 1:10 PM

## 2021-12-17 ENCOUNTER — Ambulatory Visit: Payer: Medicare Other

## 2021-12-17 DIAGNOSIS — M5416 Radiculopathy, lumbar region: Secondary | ICD-10-CM

## 2021-12-17 DIAGNOSIS — R262 Difficulty in walking, not elsewhere classified: Secondary | ICD-10-CM

## 2021-12-17 DIAGNOSIS — G8929 Other chronic pain: Secondary | ICD-10-CM

## 2021-12-17 DIAGNOSIS — M62838 Other muscle spasm: Secondary | ICD-10-CM

## 2021-12-17 DIAGNOSIS — M545 Low back pain, unspecified: Secondary | ICD-10-CM | POA: Diagnosis not present

## 2021-12-17 DIAGNOSIS — M6281 Muscle weakness (generalized): Secondary | ICD-10-CM

## 2021-12-17 NOTE — Therapy (Signed)
OUTPATIENT PHYSICAL THERAPY TREATMENT NOTE      Patient Name: Julia Deleon MRN: [id number] DOB:06-27-1945, 76 y.o., female Today's Date: 12/17/2021  PCP: Charlynne Cousins, MD REFERRING PROVIDER: Diamond Nickel, DO   PT End of Session - 12/17/21 1020     Visit Number [id number]    Number of Visits 44    Date for PT Re-Evaluation 12/31/21    Authorization Type Medicare Part A&B    Authorization Time Period 11/03/21-01/03/22    PT Start Time 1020    PT Stop Time 1059    PT Time Calculation (min) 39 min    Activity Tolerance Patient tolerated treatment well    Behavior During Therapy WFL for tasks assessed/performed                                         Past Medical History:  Diagnosis Date   Arthritis    Chronic kidney disease    COPD (chronic obstructive pulmonary disease) ([place])    pt denies    Dyspnea    Family history of adverse reaction to anesthesia    father was slow to wake up   Fuch's endothelial dystrophy    GERD (gastroesophageal reflux disease)    Hyperlipidemia    Hypertension    Hypothyroidism    Pneumonia    hx of    PONV (postoperative nausea and vomiting)    slow to wake up and PONV   Past Surgical History:  Procedure Laterality Date   ABDOMINAL HYSTERECTOMY     APPENDECTOMY     BACK SURGERY  09/26/2018   L4-5 PLIF by Dr. Arnoldo Morale   CARDIAC CATHETERIZATION     CHOLECYSTECTOMY     COLONOSCOPY WITH PROPOFOL N/A 10/23/2020   Procedure: COLONOSCOPY WITH PROPOFOL;  Surgeon: Virgel Manifold, MD;  Location: [facility] ENDOSCOPY;  Service: Endoscopy;  Laterality: N/A;   ESOPHAGOGASTRODUODENOSCOPY N/A 10/23/2020   Procedure: ESOPHAGOGASTRODUODENOSCOPY (EGD);  Surgeon: Virgel Manifold, MD;  [facility]: [facility] ENDOSCOPY;  Service: Endoscopy;  Laterality: N/A;   EYE SURGERY     FOOT SURGERY Right    pin removed left   laser vein surgery     NASAL SINUS SURGERY     RIGHT/LEFT HEART CATH AND CORONARY ANGIOGRAPHY N/A 12/06/2017    Procedure: RIGHT/LEFT HEART CATH AND CORONARY ANGIOGRAPHY;  Surgeon: Yolonda Kida, MD;  Location: [place] [facility];  Service: Cardiovascular;  Laterality: N/A;   ROBOTIC ASSISTED LAPAROSCOPIC SACROCOLPOPEXY Bilateral 07/30/2020   Procedure: XI ROBOTIC ASSISTED LAPAROSCOPIC SACROCOLPOPEXY AND SUPRACERVICAL HYSTERECTOMY WITH BILATERAL SALPINGO OOPHERECTOMY;  Surgeon: Ardis Hughs, MD;  Location: [facility];  Service: Urology;  Laterality: Bilateral;  REQUESTING 4 HRS   TONSILLECTOMY     Patient Active Problem List   Diagnosis Date Noted   Essential hypertension 01/23/2021   Cystocele with prolapse 07/30/2020   Postsurgical arthrodesis status 07/25/2020   Facet arthropathy 04/08/2020   Family history of breast cancer 11/16/2019   Body mass index (BMI) 25.0-25.9, adult 07/24/2019   Chronic UTI 12/06/2018   Status post lumbar surgery 12/06/2018   Spondylolisthesis, lumbar region 09/26/2018   Aortic atherosclerosis ([place]) 08/31/2018   Food sensitivity headache 03/29/2018   CAD (coronary artery disease) 02/23/2018   Advanced care planning/counseling discussion 03/28/2017   Varicose veins of both lower extremities with complications 40/98/1191   Arthritis 12/09/2016   Vaginal atrophy 09/10/2015   Vasomotor symptoms due  to menopause 09/10/2015   Onychomycosis due to dermatophyte 04/07/2015   Bursitis of right shoulder 02/26/2015   Environmental and seasonal allergies 02/26/2015   Other allergic rhinitis 02/26/2015   Osteoporosis 11/26/2014   COPD (chronic obstructive pulmonary disease) ([place]) 11/26/2014   Fuchs' corneal dystrophy 11/26/2014   Benign hypertensive renal disease 11/26/2014   Hyperlipidemia 11/26/2014   Chronic kidney disease, stage III (moderate) ([place]) 11/26/2014   Hypothyroidism 11/26/2014   Endothelial corneal dystrophy 11/26/2014     REFERRING DIAG: Chronic L low back pain  THERAPY DIAG:  Chronic bilateral low back pain, unspecified whether sciatica  present  Muscle weakness (generalized)  Difficulty in walking, not elsewhere classified  Radiculopathy, lumbar region  PERTINENT HISTORY: Chronic low back pain. Had L4/5 fusion in her low back May 2020 which helped her sleep. Had PT following surgery which helped. Bowel and bladder are not perfect but improving. Had pelvic floor therapy which did not help. The bowel issues might have started after the back surgery. Feels like her urinary tract system quit due to the pain after back surgery. Was placed on a catheter for 6 months after the surgery. Denies saddle anesthesia. Pt also states having a kidney infection May 2022 and was diagnosed at the end of [place] 2022. Took all summer to get rid of the kidney infection which wiped her out and weakned her back.   PRECAUTIONS: No known precautions  SUBJECTIVE:  Back is about a 3/10 currently. Always better in the morning. 4:30 om to 5pm, back bothers her.     PAIN:  Are you having pain? 3/10 low back pain currently     TODAY'S TREATMENT:  Objective     Latex allergies     Therapeutic exercise    Standing low rows red band 10x5 seconds for 3 sets to promote trunk strength    Standing PNF chops red band   R 10x5 seconds   L 10x5 seconds  Standing static mini lunge  R 10x2  L 10x2  Standing hip abduction  yellow band with B UE assist   R 10x3  L 10x3  Standing hip extension yellow band with B UE assist   R 10x3  L 10x3  Sitting in upright posture in neutral:  PT manual perturbation all planes isometrics 1 minute x 3   PT manually resisted R lateral shift isometrics, trunk flexion isometrics, L upper trunk rotation isometrics to decrease L lateral shift, R lumbar rotation posture. 10x5 seconds for 2 sets       Improved exercise technique, movement at target joints, use of target muscles after mod verbal, visual, tactile cues.       Response to treatment Fair tolerance to today's session.   Clinical  impression   Continued focusing in strengthening (trunk and glute) exercies and improving posture to promote ability to perform standing tasks in the kitchen with less difficulty and back pain. Fair tolerance to today's session. Pt will benefit from continued skilled physical therapy services to decrease pain, improve strength and function.         PATIENT EDUCATION: Education details: there-ex, HEP Person educated: Patient Education method: Explanation, Demonstration, Tactile cues, Verbal cues, and Handouts Education comprehension: verbalized understanding and returned demonstration   HOME EXERCISE PROGRAM: Access Code: [email or web address] URL: [email or web address] Date: 08/04/2021 Prepared by: Joneen Boers  Exercises - Seated Transversus Abdominis Bracing  - 5 x daily - 7 x weekly - 3 sets - 10 reps - 5 seconds hold - Seated Trunk Rotation -  Arms Crossed  - 1 x daily - 7 x weekly - 3 sets - 10 reps - 5 seconds  hold - Standing Gluteal Sets  - 1 x daily - 7 x weekly - 3 sets - 10 reps - 5 seconds hold - Supine Double Knee to Chest  - 1 x daily - 7 x weekly - 2 sets - 15 reps (Discontinued 08/06/2021 secondary to increased symptoms)  - Seated Sciatic Tensioner  - 1 x daily - 7 x weekly - 2 sets - 15 reps (Discontinued 3/303/2023 due to increased symptoms.) - Supine Posterior Pelvic Tilt  - 1 x daily - 7 x weekly - 3 sets - 10 reps - 5 seconds hold -Reclined Hip extension isometrics, leg straight to promote glute max muscle strengthening                                     R 10x5 seconds for 3 sets                                     L 10x5 seconds for 3 sets - Reclined Single Knee to Chest Stretch  - 1 x daily - 7 x weekly - 3 sets - 10 reps - 5 seconds hold   Standing with B UE assist, one foot on 2nd stair step with glute max squeeze to promote stretch of hip flexor muscles on stance LE  Stance LE: R 10x5 seconds for 3 sets    L 10x5 seconds for 3 sets  - Standing  Bilateral Low Shoulder Row with Anchored Resistance  - 1 x daily - 7 x weekly - 3 sets - 10 reps - 5 seconds hold  - Standing Hip Abduction with Resistance at Ankles and Counter Support  - 1 x daily - 7 x weekly - 3 sets - 10 reps (Yellow latex free band)      PT Short Term Goals - 08/11/21 1152       PT SHORT TERM GOAL #1   Title Pt will be independent with her initial HEP to decrease pain, improve strength and ability to ambulate longer distances.    Baseline Pt has started her initial HEP (07/09/2021); Has been doing her HEP. Does not have questions (08/11/2021)    Time 3    Period Weeks    Status Achieved    Target Date 07/30/21              PT Long Term Goals - 12/15/21 1020       PT LONG TERM GOAL #1   Title Pt will have a decrease in low back pain to 4/10 or less at worst to promote ability to ambulate longer distances as well as perform standing tasks more comfortably.    Baseline 9/10 low back pain at most for the past 3 months. Pt also demonstrates B L5 radiating symptoms to knees (07/09/2021); 7-8/10 at most for the past 7 days, usually around 5 pm and later (08/11/2021); 7-8/10 at most for the past 7 days, usually in the evening. (09/01/2021); 7.5/10 back pain at most  for the past 7 days (09/24/2021); 10/10 last Friday (other than that, 7-8/10) ( 11/03/2021); 7-8/10 low back pain at most for the past 7 days; has been in the care a lot.  (12/15/2021)    Time 8  Period Weeks    Status On-going    Target Date 12/31/21      PT LONG TERM GOAL #2   Title Pt will improve B hip extension, abduction and ER strength by at least 1/2 MMT grade to promote ability to perform standing tasks with less back pain.    Baseline Hip extension 4+/5 R, 3+/5 L, hip abduction 4/5 R,  4-/5 L, hip ER 4-/5 R, 3+/5 L (07/09/2021); hip extensoin 4+/5 R, 5/5 L, hip abduction 4+/5 R and L, hip ER 4/5 R, 4/5 L (08/11/2021); hip extension 5/5 R and L, hip abduction 4+/5 R and L, hip ER 4/5 R and L (09/01/2021); hip  extension 5/5 R and L, hip abduction 4+/5 R and L, hip ER 4/5 R and L, pt states L knee is the side that needs a knee replacement (09/24/2021); hip extension 5/5 R, 4+/5 L, hip abduction 4+/5 R, and 4/5 L, hip ER 4/5 R, 4/5 L (11/03/2021)    Time 8    Period Weeks    Status Achieved    Target Date 10/29/21      PT LONG TERM GOAL #3   Title Pt will improve her lumbar FOTO score by at least 10 points as a demonstration of improved function.    Baseline Lumbar Spine FOTO 44 (07/09/2021); Lumbar FOTO 40. Pt might have anwered her back function limitation question differently secondary to pt stating more limited in function but that was before starting PT. Might have answered increased limitation in function during today's session since starting PT instead which may have decrease her score. (08/11/2021).  47 (09/01/2021); 42 (09/24/2021); 40 (12/15/2021)    Time 8    Period Weeks    Status On-going    Target Date 12/31/21              Plan - 12/17/21 1021     Clinical Impression Statement Continued focusing in strengthening (trunk and glute) exercies and improving posture to promote ability to perform standing tasks in the kitchen with less difficulty and back pain. Fair tolerance to today's session. Pt will benefit from continued skilled physical therapy services to decrease pain, improve strength and function.    Personal Factors and Comorbidities Age;Comorbidity 3+;Fitness;Past/Current Experience;Time since onset of injury/illness/exacerbation    Comorbidities B knee pain, arthritis, chronic kidney disease, COPD, dyspnea, HTN    Examination-Activity Limitations Squat;Lift;Stairs;Locomotion Level;Stand;Reach Overhead;Carry    Stability/Clinical Decision Making Stable/Uncomplicated    Rehab Potential Fair    PT Frequency 2x / week    PT Duration 8 weeks    PT Treatment/Interventions Therapeutic activities;Therapeutic exercise;Balance training;Neuromuscular re-education;Patient/family education;Manual  techniques;Dry needling;Aquatic Therapy;Electrical Stimulation;Iontophoresis '4mg'$ /ml Dexamethasone    PT Next Visit Plan Posture, trunk and hip strengthening, manual techniques, modalities PRN    PT Home Exercise Plan Medbridge Access Code [email or web address]    Consulted and Agree with Plan of Care Patient                                        Joneen Boers PT, DPT  12/17/2021, 11:57 AM

## 2021-12-21 ENCOUNTER — Ambulatory Visit: Payer: Medicare Other

## 2021-12-21 DIAGNOSIS — M5416 Radiculopathy, lumbar region: Secondary | ICD-10-CM

## 2021-12-21 DIAGNOSIS — M6281 Muscle weakness (generalized): Secondary | ICD-10-CM

## 2021-12-21 DIAGNOSIS — M545 Low back pain, unspecified: Secondary | ICD-10-CM | POA: Diagnosis not present

## 2021-12-21 DIAGNOSIS — G8929 Other chronic pain: Secondary | ICD-10-CM

## 2021-12-21 DIAGNOSIS — R262 Difficulty in walking, not elsewhere classified: Secondary | ICD-10-CM

## 2021-12-21 NOTE — Therapy (Signed)
OUTPATIENT PHYSICAL THERAPY TREATMENT NOTE      Patient Name: Julia Deleon MRN: [id number] DOB:1945-08-20, 76 y.o., female Today's Date: 12/21/2021  PCP: Charlynne Cousins, MD REFERRING PROVIDER: Diamond Nickel, DO   PT End of Session - 12/21/21 0849     Visit Number [id number]    Number of Visits 65    Date for PT Re-Evaluation 12/31/21    Authorization Type Medicare Part A&B    Authorization Time Period 11/03/21-01/03/22    PT Start Time 0847    PT Stop Time 0930    PT Time Calculation (min) 43 min    Activity Tolerance Patient tolerated treatment well    Behavior During Therapy WFL for tasks assessed/performed              Past Medical History:  Diagnosis Date   Arthritis    Chronic kidney disease    COPD (chronic obstructive pulmonary disease) ([place])    pt denies    Dyspnea    Family history of adverse reaction to anesthesia    father was slow to wake up   Fuch's endothelial dystrophy    GERD (gastroesophageal reflux disease)    Hyperlipidemia    Hypertension    Hypothyroidism    Pneumonia    hx of    PONV (postoperative nausea and vomiting)    slow to wake up and PONV   Past Surgical History:  Procedure Laterality Date   ABDOMINAL HYSTERECTOMY     APPENDECTOMY     BACK SURGERY  09/26/2018   L4-5 PLIF by Dr. Arnoldo Morale   CARDIAC CATHETERIZATION     CHOLECYSTECTOMY     COLONOSCOPY WITH PROPOFOL N/A 10/23/2020   Procedure: COLONOSCOPY WITH PROPOFOL;  Surgeon: Virgel Manifold, MD;  Location: [facility] ENDOSCOPY;  Service: Endoscopy;  Laterality: N/A;   ESOPHAGOGASTRODUODENOSCOPY N/A 10/23/2020   Procedure: ESOPHAGOGASTRODUODENOSCOPY (EGD);  Surgeon: Virgel Manifold, MD;  Location: [facility] ENDOSCOPY;  Service: Endoscopy;  Laterality: N/A;   EYE SURGERY     FOOT SURGERY Right    pin removed left   laser vein surgery     NASAL SINUS SURGERY     RIGHT/LEFT HEART CATH AND CORONARY ANGIOGRAPHY N/A 12/06/2017   Procedure: RIGHT/LEFT HEART CATH AND CORONARY ANGIOGRAPHY;   Surgeon: Yolonda Kida, MD;  Location: [place] [facility];  Service: Cardiovascular;  Laterality: N/A;   ROBOTIC ASSISTED LAPAROSCOPIC SACROCOLPOPEXY Bilateral 07/30/2020   Procedure: XI ROBOTIC ASSISTED LAPAROSCOPIC SACROCOLPOPEXY AND SUPRACERVICAL HYSTERECTOMY WITH BILATERAL SALPINGO OOPHERECTOMY;  Surgeon: Ardis Hughs, MD;  Location: [facility];  Service: Urology;  Laterality: Bilateral;  REQUESTING 4 HRS   TONSILLECTOMY     Patient Active Problem List   Diagnosis Date Noted   Essential hypertension 01/23/2021   Cystocele with prolapse 07/30/2020   Postsurgical arthrodesis status 07/25/2020   Facet arthropathy 04/08/2020   Family history of breast cancer 11/16/2019   Body mass index (BMI) 25.0-25.9, adult 07/24/2019   Chronic UTI 12/06/2018   Status post lumbar surgery 12/06/2018   Spondylolisthesis, lumbar region 09/26/2018   Aortic atherosclerosis ([place]) 08/31/2018   Food sensitivity headache 03/29/2018   CAD (coronary artery disease) 02/23/2018   Advanced care planning/counseling discussion 03/28/2017   Varicose veins of both lower extremities with complications 10/31/7626   Arthritis 12/09/2016   Vaginal atrophy 09/10/2015   Vasomotor symptoms due to menopause 09/10/2015   Onychomycosis due to dermatophyte 04/07/2015   Bursitis of right shoulder 02/26/2015   Environmental and seasonal allergies 02/26/2015   Other  allergic rhinitis 02/26/2015   Osteoporosis 11/26/2014   COPD (chronic obstructive pulmonary disease) ([place]) 11/26/2014   Fuchs' corneal dystrophy 11/26/2014   Benign hypertensive renal disease 11/26/2014   Hyperlipidemia 11/26/2014   Chronic kidney disease, stage III (moderate) ([place]) 11/26/2014   Hypothyroidism 11/26/2014   Endothelial corneal dystrophy 11/26/2014     REFERRING DIAG: Chronic L low back pain  THERAPY DIAG:  Chronic bilateral low back pain, unspecified whether sciatica present  Muscle weakness (generalized)  Difficulty in walking,  not elsewhere classified  Radiculopathy, lumbar region  PERTINENT HISTORY: Chronic low back pain. Had L4/5 fusion in her low back May 2020 which helped her sleep. Had PT following surgery which helped. Bowel and bladder are not perfect but improving. Had pelvic floor therapy which did not help. The bowel issues might have started after the back surgery. Feels like her urinary tract system quit due to the pain after back surgery. Was placed on a catheter for 6 months after the surgery. Denies saddle anesthesia. Pt also states having a kidney infection May 2022 and was diagnosed at the end of [place] 2022. Took all summer to get rid of the kidney infection which wiped her out and weakned her back.   PRECAUTIONS: No known precautions  SUBJECTIVE:  Reports slow progress with PT, mild LBP this morning.    PAIN:  Are you having pain? 3/10 low back pain currently     TODAY'S TREATMENT:  Objective     Latex allergies     Therapeutic exercise 12/21/21    Standing low rows red band 10x5 seconds for 3 sets to promote trunk strength. Mod VC's and tactile cues for neutral pelvic alignment, staggered step to reduce lordosis and lumbar extension.    Seated RTB pallof press: mod to max multi modal cuing for form/technique. Keeping spine off of back of chair. VC's for upright posture. Improved carryover with form/technique on second sets.   Seated on physioball:   Anterior/posterior pelvic tilts. BUE support throughout. Max multimodal cuing for form/technique at pelvis.   Seated UE raises overhead. Max TC's on upper thoracic to prevent posterior lean. 2x8   Seated marches alternating Max TC's on upper thoracic to prevent posterior lean. 2x8 with SUE support.   4" forward step ups with contralateral hip flexion with SUE support: 1x8/LE   Standing hip abduction with 2# AW's with BUE support: 1x10/LE. Min to mod VC's for form/technique.        Improved exercise technique, movement at target  joints, use of target muscles after mod verbal, visual, tactile cues.       Response to treatment Fair tolerance to today's session.   Clinical impression Continuing PT POC with focus on postural strengthening. Pt demonstrating heavy posterior bias with lumbar extension compensation relying on mod to max multimodal cuing for motor control and ability to maintain neutral upright posture. Pt may benefit at future sessions on use of mirror for visual cuing for motor control and posture. Pt will continue to benefit from skilled PT services to progress strengthening and reduced pain levels for improved function.   PATIENT EDUCATION: Education details: there-ex, HEP Person educated: Patient Education method: Explanation, Demonstration, Tactile cues, Verbal cues, and Handouts Education comprehension: verbalized understanding and returned demonstration   HOME EXERCISE PROGRAM: Access Code: [email or web address] URL: [email or web address] Date: 08/04/2021 Prepared by: Joneen Boers  Exercises - Seated Transversus Abdominis Bracing  - 5 x daily - 7 x weekly - 3 sets - 10 reps - 5 seconds hold -  Seated Trunk Rotation - Arms Crossed  - 1 x daily - 7 x weekly - 3 sets - 10 reps - 5 seconds  hold - Standing Gluteal Sets  - 1 x daily - 7 x weekly - 3 sets - 10 reps - 5 seconds hold - Supine Double Knee to Chest  - 1 x daily - 7 x weekly - 2 sets - 15 reps (Discontinued 08/06/2021 secondary to increased symptoms)  - Seated Sciatic Tensioner  - 1 x daily - 7 x weekly - 2 sets - 15 reps (Discontinued 3/303/2023 due to increased symptoms.) - Supine Posterior Pelvic Tilt  - 1 x daily - 7 x weekly - 3 sets - 10 reps - 5 seconds hold -Reclined Hip extension isometrics, leg straight to promote glute max muscle strengthening                                     R 10x5 seconds for 3 sets                                     L 10x5 seconds for 3 sets - Reclined Single Knee to Chest Stretch  - 1 x daily - 7 x  weekly - 3 sets - 10 reps - 5 seconds hold   Standing with B UE assist, one foot on 2nd stair step with glute max squeeze to promote stretch of hip flexor muscles on stance LE  Stance LE: R 10x5 seconds for 3 sets    L 10x5 seconds for 3 sets  - Standing Bilateral Low Shoulder Row with Anchored Resistance  - 1 x daily - 7 x weekly - 3 sets - 10 reps - 5 seconds hold  - Standing Hip Abduction with Resistance at Ankles and Counter Support  - 1 x daily - 7 x weekly - 3 sets - 10 reps (Yellow latex free band)      PT Short Term Goals - 08/11/21 1152       PT SHORT TERM GOAL #1   Title Pt will be independent with her initial HEP to decrease pain, improve strength and ability to ambulate longer distances.    Baseline Pt has started her initial HEP (07/09/2021); Has been doing her HEP. Does not have questions (08/11/2021)    Time 3    Period Weeks    Status Achieved    Target Date 07/30/21              PT Long Term Goals - 12/15/21 1020       PT LONG TERM GOAL #1   Title Pt will have a decrease in low back pain to 4/10 or less at worst to promote ability to ambulate longer distances as well as perform standing tasks more comfortably.    Baseline 9/10 low back pain at most for the past 3 months. Pt also demonstrates B L5 radiating symptoms to knees (07/09/2021); 7-8/10 at most for the past 7 days, usually around 5 pm and later (08/11/2021); 7-8/10 at most for the past 7 days, usually in the evening. (09/01/2021); 7.5/10 back pain at most  for the past 7 days (09/24/2021); 10/10 last Friday (other than that, 7-8/10) ( 11/03/2021); 7-8/10 low back pain at most for the past 7 days; has been in the care a lot.  (12/15/2021)  Time 8    Period Weeks    Status On-going    Target Date 12/31/21      PT LONG TERM GOAL #2   Title Pt will improve B hip extension, abduction and ER strength by at least 1/2 MMT grade to promote ability to perform standing tasks with less back pain.    Baseline Hip  extension 4+/5 R, 3+/5 L, hip abduction 4/5 R,  4-/5 L, hip ER 4-/5 R, 3+/5 L (07/09/2021); hip extensoin 4+/5 R, 5/5 L, hip abduction 4+/5 R and L, hip ER 4/5 R, 4/5 L (08/11/2021); hip extension 5/5 R and L, hip abduction 4+/5 R and L, hip ER 4/5 R and L (09/01/2021); hip extension 5/5 R and L, hip abduction 4+/5 R and L, hip ER 4/5 R and L, pt states L knee is the side that needs a knee replacement (09/24/2021); hip extension 5/5 R, 4+/5 L, hip abduction 4+/5 R, and 4/5 L, hip ER 4/5 R, 4/5 L (11/03/2021)    Time 8    Period Weeks    Status Achieved    Target Date 10/29/21      PT LONG TERM GOAL #3   Title Pt will improve her lumbar FOTO score by at least 10 points as a demonstration of improved function.    Baseline Lumbar Spine FOTO 44 (07/09/2021); Lumbar FOTO 40. Pt might have anwered her back function limitation question differently secondary to pt stating more limited in function but that was before starting PT. Might have answered increased limitation in function during today's session since starting PT instead which may have decrease her score. (08/11/2021).  47 (09/01/2021); 42 (09/24/2021); 40 (12/15/2021)    Time 8    Period Weeks    Status On-going    Target Date 12/31/21             Salem Caster. Fairly IV, PT, DPT Physical Therapist- [facility]  12/21/2021, 10:30 AM

## 2021-12-22 ENCOUNTER — Ambulatory Visit: Payer: Medicare Other

## 2021-12-23 ENCOUNTER — Ambulatory Visit
Admission: RE | Admit: 2021-12-23 | Discharge: 2021-12-23 | Disposition: A | Discharge status: Home / Self Care | Payer: Medicare Other | Source: Ambulatory Visit | Attending: Obstetrics and Gynecology | Admitting: Obstetrics and Gynecology

## 2021-12-23 DIAGNOSIS — Z1231 Encounter for screening mammogram for malignant neoplasm of breast: Secondary | ICD-10-CM | POA: Insufficient documentation

## 2021-12-24 ENCOUNTER — Ambulatory Visit: Payer: Medicare Other

## 2021-12-24 DIAGNOSIS — R262 Difficulty in walking, not elsewhere classified: Secondary | ICD-10-CM

## 2021-12-24 DIAGNOSIS — M6281 Muscle weakness (generalized): Secondary | ICD-10-CM

## 2021-12-24 DIAGNOSIS — M62838 Other muscle spasm: Secondary | ICD-10-CM

## 2021-12-24 DIAGNOSIS — M5416 Radiculopathy, lumbar region: Secondary | ICD-10-CM

## 2021-12-24 DIAGNOSIS — M545 Low back pain, unspecified: Secondary | ICD-10-CM | POA: Diagnosis not present

## 2021-12-24 NOTE — Therapy (Signed)
OUTPATIENT PHYSICAL THERAPY TREATMENT NOTE      Patient Name: Julia Deleon MRN: [id number] DOB:01/05/1946, 76 y.o., female Today's Date: 12/24/2021  PCP: Idelle Crouch, MD REFERRING PROVIDER: Diamond Nickel, DO   PT End of Session - 12/24/21 1018     Visit Number [id number]    Number of Visits 91    Date for PT Re-Evaluation 12/31/21    Authorization Type Medicare Part A&B    Authorization Time Period 11/03/21-01/03/22    PT Start Time 1017    PT Stop Time 1100    PT Time Calculation (min) 43 min    Activity Tolerance Patient tolerated treatment well    Behavior During Therapy WFL for tasks assessed/performed              Past Medical History:  Diagnosis Date   Arthritis    Chronic kidney disease    COPD (chronic obstructive pulmonary disease) ([place])    pt denies    Dyspnea    Family history of adverse reaction to anesthesia    father was slow to wake up   Fuch's endothelial dystrophy    GERD (gastroesophageal reflux disease)    Hyperlipidemia    Hypertension    Hypothyroidism    Pneumonia    hx of    PONV (postoperative nausea and vomiting)    slow to wake up and PONV   Past Surgical History:  Procedure Laterality Date   ABDOMINAL HYSTERECTOMY     APPENDECTOMY     BACK SURGERY  09/26/2018   L4-5 PLIF by Dr. Arnoldo Morale   CARDIAC CATHETERIZATION     CHOLECYSTECTOMY     COLONOSCOPY WITH PROPOFOL N/A 10/23/2020   Procedure: COLONOSCOPY WITH PROPOFOL;  Surgeon: Virgel Manifold, MD;  Location: [facility] ENDOSCOPY;  Service: Endoscopy;  Laterality: N/A;   ESOPHAGOGASTRODUODENOSCOPY N/A 10/23/2020   Procedure: ESOPHAGOGASTRODUODENOSCOPY (EGD);  Surgeon: Virgel Manifold, MD;  Location: [facility] ENDOSCOPY;  Service: Endoscopy;  Laterality: N/A;   EYE SURGERY     FOOT SURGERY Right    pin removed left   laser vein surgery     NASAL SINUS SURGERY     RIGHT/LEFT HEART CATH AND CORONARY ANGIOGRAPHY N/A 12/06/2017   Procedure: RIGHT/LEFT HEART CATH AND CORONARY  ANGIOGRAPHY;  Surgeon: Yolonda Kida, MD;  Location: [place] [facility];  Service: Cardiovascular;  Laterality: N/A;   ROBOTIC ASSISTED LAPAROSCOPIC SACROCOLPOPEXY Bilateral 07/30/2020   Procedure: XI ROBOTIC ASSISTED LAPAROSCOPIC SACROCOLPOPEXY AND SUPRACERVICAL HYSTERECTOMY WITH BILATERAL SALPINGO OOPHERECTOMY;  Surgeon: Ardis Hughs, MD;  Location: [facility];  Service: Urology;  Laterality: Bilateral;  REQUESTING 4 HRS   TONSILLECTOMY     Patient Active Problem List   Diagnosis Date Noted   Essential hypertension 01/23/2021   Cystocele with prolapse 07/30/2020   Postsurgical arthrodesis status 07/25/2020   Facet arthropathy 04/08/2020   Family history of breast cancer 11/16/2019   Body mass index (BMI) 25.0-25.9, adult 07/24/2019   Chronic UTI 12/06/2018   Status post lumbar surgery 12/06/2018   Spondylolisthesis, lumbar region 09/26/2018   Aortic atherosclerosis ([place]) 08/31/2018   Food sensitivity headache 03/29/2018   CAD (coronary artery disease) 02/23/2018   Advanced care planning/counseling discussion 03/28/2017   Varicose veins of both lower extremities with complications 47/65/4650   Arthritis 12/09/2016   Vaginal atrophy 09/10/2015   Vasomotor symptoms due to menopause 09/10/2015   Onychomycosis due to dermatophyte 04/07/2015   Bursitis of right shoulder 02/26/2015   Environmental and seasonal allergies 02/26/2015  Other allergic rhinitis 02/26/2015   Osteoporosis 11/26/2014   COPD (chronic obstructive pulmonary disease) ([place]) 11/26/2014   Fuchs' corneal dystrophy 11/26/2014   Benign hypertensive renal disease 11/26/2014   Hyperlipidemia 11/26/2014   Chronic kidney disease, stage III (moderate) ([place]) 11/26/2014   Hypothyroidism 11/26/2014   Endothelial corneal dystrophy 11/26/2014     REFERRING DIAG: Chronic L low back pain  THERAPY DIAG:  Chronic bilateral low back pain, unspecified whether sciatica present  Muscle weakness  (generalized)  Difficulty in walking, not elsewhere classified  Radiculopathy, lumbar region  PERTINENT HISTORY: Chronic low back pain. Had L4/5 fusion in her low back May 2020 which helped her sleep. Had PT following surgery which helped. Bowel and bladder are not perfect but improving. Had pelvic floor therapy which did not help. The bowel issues might have started after the back surgery. Feels like her urinary tract system quit due to the pain after back surgery. Was placed on a catheter for 6 months after the surgery. Denies saddle anesthesia. Pt also states having a kidney infection May 2022 and was diagnosed at the end of [place] 2022. Took all summer to get rid of the kidney infection which wiped her out and weakned her back.   PRECAUTIONS: No known precautions  SUBJECTIVE:  2-3/10 LBP this morning. Just feels weak.    PAIN:  Are you having pain? 3/10 low back pain currently     TODAY'S TREATMENT:  Objective     Latex allergies     Therapeutic exercise 12/24/21    Standing low rows red band 10x5 seconds for 3 sets to promote trunk strength. Mod VC's and tactile cues for neutral pelvic alignment, staggered step to reduce lordosis and lumbar extension.    Seated T rows with RTB. Provided pillow at lumbar support to prevent posterior lean and hinging at lumbar spine. Max TC's for form/technique on UE's. 3x10. Provided hand out for HEP.   Seated on physioball:   Anterior/posterior pelvic tilts. BUE support throughout. Max multimodal cuing for form/technique at pelvis. Improved carrover from last session not needing any cues last set. 3x8  Seated UE raises overhead. Max TC's on upper thoracic to prevent posterior lean. 3x8   Seated marches alternating Max TC's on upper thoracic to prevent posterior lean. 3x8 with SUE support.   Seated physioball roll outs: x10   4" forward step ups with contralateral hip flexion with SUE support: 2x10/LE   Improved exercise technique,  movement at target joints, use of target muscles after mod verbal, visual, tactile cues.       Response to treatment Fair tolerance to today's session.   Clinical impression Continuing PT POC with focus on core and postural strengthening. Pt does demonstrate improved carryover with core strengthening exercises on physioball but for other exercises does rely on max TC's for form/technique due to difficulty with motor patterns. Pt tends to hinge at lumbar spine excessively relying on TC's and pillow support and sitting exercises to isolate pelvis and promote neutral pelvic alignment for optimal form/technique to targeted tissues. Pt will continue to benefit from skilled PT services to progress strength and decrease pain to improve function.   PATIENT EDUCATION: Education details: there-ex, HEP Person educated: Patient Education method: Explanation, Demonstration, Tactile cues, Verbal cues, and Handouts Education comprehension: verbalized understanding and returned demonstration   HOME EXERCISE PROGRAM: Access Code: [email or web address] URL: [email or web address] Date: 08/04/2021 Prepared by: Joneen Boers  Exercises - Seated Transversus Abdominis Bracing  - 5 x daily - 7 x weekly -  3 sets - 10 reps - 5 seconds hold - Seated Trunk Rotation - Arms Crossed  - 1 x daily - 7 x weekly - 3 sets - 10 reps - 5 seconds  hold - Standing Gluteal Sets  - 1 x daily - 7 x weekly - 3 sets - 10 reps - 5 seconds hold - Supine Double Knee to Chest  - 1 x daily - 7 x weekly - 2 sets - 15 reps (Discontinued 08/06/2021 secondary to increased symptoms)  - Seated Sciatic Tensioner  - 1 x daily - 7 x weekly - 2 sets - 15 reps (Discontinued 3/303/2023 due to increased symptoms.) - Supine Posterior Pelvic Tilt  - 1 x daily - 7 x weekly - 3 sets - 10 reps - 5 seconds hold -Reclined Hip extension isometrics, leg straight to promote glute max muscle strengthening                                     R 10x5 seconds  for 3 sets                                     L 10x5 seconds for 3 sets - Reclined Single Knee to Chest Stretch  - 1 x daily - 7 x weekly - 3 sets - 10 reps - 5 seconds hold   Standing with B UE assist, one foot on 2nd stair step with glute max squeeze to promote stretch of hip flexor muscles on stance LE  Stance LE: R 10x5 seconds for 3 sets    L 10x5 seconds for 3 sets  - Standing Bilateral Low Shoulder Row with Anchored Resistance  - 1 x daily - 7 x weekly - 3 sets - 10 reps - 5 seconds hold  - Standing Hip Abduction with Resistance at Ankles and Counter Support  - 1 x daily - 7 x weekly - 3 sets - 10 reps (Yellow latex free band)      PT Short Term Goals - 08/11/21 1152       PT SHORT TERM GOAL #1   Title Pt will be independent with her initial HEP to decrease pain, improve strength and ability to ambulate longer distances.    Baseline Pt has started her initial HEP (07/09/2021); Has been doing her HEP. Does not have questions (08/11/2021)    Time 3    Period Weeks    Status Achieved    Target Date 07/30/21              PT Long Term Goals - 12/15/21 1020       PT LONG TERM GOAL #1   Title Pt will have a decrease in low back pain to 4/10 or less at worst to promote ability to ambulate longer distances as well as perform standing tasks more comfortably.    Baseline 9/10 low back pain at most for the past 3 months. Pt also demonstrates B L5 radiating symptoms to knees (07/09/2021); 7-8/10 at most for the past 7 days, usually around 5 pm and later (08/11/2021); 7-8/10 at most for the past 7 days, usually in the evening. (09/01/2021); 7.5/10 back pain at most  for the past 7 days (09/24/2021); 10/10 last Friday (other than that, 7-8/10) ( 11/03/2021); 7-8/10 low back pain at most for the past 7 days;  has been in the care a lot.  (12/15/2021)    Time 8    Period Weeks    Status On-going    Target Date 12/31/21      PT LONG TERM GOAL #2   Title Pt will improve B hip extension, abduction  and ER strength by at least 1/2 MMT grade to promote ability to perform standing tasks with less back pain.    Baseline Hip extension 4+/5 R, 3+/5 L, hip abduction 4/5 R,  4-/5 L, hip ER 4-/5 R, 3+/5 L (07/09/2021); hip extensoin 4+/5 R, 5/5 L, hip abduction 4+/5 R and L, hip ER 4/5 R, 4/5 L (08/11/2021); hip extension 5/5 R and L, hip abduction 4+/5 R and L, hip ER 4/5 R and L (09/01/2021); hip extension 5/5 R and L, hip abduction 4+/5 R and L, hip ER 4/5 R and L, pt states L knee is the side that needs a knee replacement (09/24/2021); hip extension 5/5 R, 4+/5 L, hip abduction 4+/5 R, and 4/5 L, hip ER 4/5 R, 4/5 L (11/03/2021)    Time 8    Period Weeks    Status Achieved    Target Date 10/29/21      PT LONG TERM GOAL #3   Title Pt will improve her lumbar FOTO score by at least 10 points as a demonstration of improved function.    Baseline Lumbar Spine FOTO 44 (07/09/2021); Lumbar FOTO 40. Pt might have anwered her back function limitation question differently secondary to pt stating more limited in function but that was before starting PT. Might have answered increased limitation in function during today's session since starting PT instead which may have decrease her score. (08/11/2021).  47 (09/01/2021); 42 (09/24/2021); 40 (12/15/2021)    Time 8    Period Weeks    Status On-going    Target Date 12/31/21             Salem Caster. Fairly IV, PT, DPT Physical Therapist- [facility]  12/24/2021, 12:40 PM

## 2021-12-29 ENCOUNTER — Ambulatory Visit: Payer: Medicare Other

## 2021-12-29 DIAGNOSIS — R262 Difficulty in walking, not elsewhere classified: Secondary | ICD-10-CM

## 2021-12-29 DIAGNOSIS — M5416 Radiculopathy, lumbar region: Secondary | ICD-10-CM

## 2021-12-29 DIAGNOSIS — M545 Low back pain, unspecified: Secondary | ICD-10-CM | POA: Diagnosis not present

## 2021-12-29 DIAGNOSIS — G8929 Other chronic pain: Secondary | ICD-10-CM

## 2021-12-29 DIAGNOSIS — M6281 Muscle weakness (generalized): Secondary | ICD-10-CM

## 2021-12-29 NOTE — Therapy (Deleted)
OUTPATIENT PHYSICAL THERAPY TREATMENT NOTE      Patient Name: Julia Deleon MRN: [id number] DOB:Nov 22, 1945, 76 y.o., female Today's Date: 12/29/2021  PCP: Idelle Crouch, MD REFERRING PROVIDER: Diamond Nickel, DO   PT End of Session - 12/29/21 1019     Visit Number 69    Number of Visits 21    Date for PT Re-Evaluation 12/31/21    Authorization Type Medicare Part A&B    Authorization Time Period 11/03/21-01/03/22    PT Start Time 1019    PT Stop Time 1100    PT Time Calculation (min) 41 min    Activity Tolerance Patient tolerated treatment well    Behavior During Therapy WFL for tasks assessed/performed               Past Medical History:  Diagnosis Date   Arthritis    Chronic kidney disease    COPD (chronic obstructive pulmonary disease) ([place])    pt denies    Dyspnea    Family history of adverse reaction to anesthesia    father was slow to wake up   Fuch's endothelial dystrophy    GERD (gastroesophageal reflux disease)    Hyperlipidemia    Hypertension    Hypothyroidism    Pneumonia    hx of    PONV (postoperative nausea and vomiting)    slow to wake up and PONV   Past Surgical History:  Procedure Laterality Date   ABDOMINAL HYSTERECTOMY     APPENDECTOMY     BACK SURGERY  09/26/2018   L4-5 PLIF by Dr. Arnoldo Morale   CARDIAC CATHETERIZATION     CHOLECYSTECTOMY     COLONOSCOPY WITH PROPOFOL N/A 10/23/2020   Procedure: COLONOSCOPY WITH PROPOFOL;  Surgeon: Virgel Manifold, MD;  Location: [facility] ENDOSCOPY;  Service: Endoscopy;  Laterality: N/A;   ESOPHAGOGASTRODUODENOSCOPY N/A 10/23/2020   Procedure: ESOPHAGOGASTRODUODENOSCOPY (EGD);  Surgeon: Virgel Manifold, MD;  Location: [facility] ENDOSCOPY;  Service: Endoscopy;  Laterality: N/A;   EYE SURGERY     FOOT SURGERY Right    pin removed left   laser vein surgery     NASAL SINUS SURGERY     RIGHT/LEFT HEART CATH AND CORONARY ANGIOGRAPHY N/A 12/06/2017   Procedure: RIGHT/LEFT HEART CATH AND CORONARY  ANGIOGRAPHY;  Surgeon: Yolonda Kida, MD;  Location: [place] [facility];  Service: Cardiovascular;  Laterality: N/A;   ROBOTIC ASSISTED LAPAROSCOPIC SACROCOLPOPEXY Bilateral 07/30/2020   Procedure: XI ROBOTIC ASSISTED LAPAROSCOPIC SACROCOLPOPEXY AND SUPRACERVICAL HYSTERECTOMY WITH BILATERAL SALPINGO OOPHERECTOMY;  Surgeon: Ardis Hughs, MD;  Location: [facility];  Service: Urology;  Laterality: Bilateral;  REQUESTING 4 HRS   TONSILLECTOMY     Patient Active Problem List   Diagnosis Date Noted   Essential hypertension 01/23/2021   Cystocele with prolapse 07/30/2020   Postsurgical arthrodesis status 07/25/2020   Facet arthropathy 04/08/2020   Family history of breast cancer 11/16/2019   Body mass index (BMI) 25.0-25.9, adult 07/24/2019   Chronic UTI 12/06/2018   Status post lumbar surgery 12/06/2018   Spondylolisthesis, lumbar region 09/26/2018   Aortic atherosclerosis ([place]) 08/31/2018   Food sensitivity headache 03/29/2018   CAD (coronary artery disease) 02/23/2018   Advanced care planning/counseling discussion 03/28/2017   Varicose veins of both lower extremities with complications 23/76/2831   Arthritis 12/09/2016   Vaginal atrophy 09/10/2015   Vasomotor symptoms due to menopause 09/10/2015   Onychomycosis due to dermatophyte 04/07/2015   Bursitis of right shoulder 02/26/2015   Environmental and seasonal allergies 02/26/2015  Other allergic rhinitis 02/26/2015   Osteoporosis 11/26/2014   COPD (chronic obstructive pulmonary disease) ([place]) 11/26/2014   Fuchs' corneal dystrophy 11/26/2014   Benign hypertensive renal disease 11/26/2014   Hyperlipidemia 11/26/2014   Chronic kidney disease, stage III (moderate) ([place]) 11/26/2014   Hypothyroidism 11/26/2014   Endothelial corneal dystrophy 11/26/2014     REFERRING DIAG: Chronic L low back pain  THERAPY DIAG:  Chronic bilateral low back pain, unspecified whether sciatica present  Muscle weakness  (generalized)  Difficulty in walking, not elsewhere classified  Radiculopathy, lumbar region  PERTINENT HISTORY: Chronic low back pain. Had L4/5 fusion in her low back May 2020 which helped her sleep. Had PT following surgery which helped. Bowel and bladder are not perfect but improving. Had pelvic floor therapy which did not help. The bowel issues might have started after the back surgery. Feels like her urinary tract system quit due to the pain after back surgery. Was placed on a catheter for 6 months after the surgery. Denies saddle anesthesia. Pt also states having a kidney infection May 2022 and was diagnosed at the end of [place] 2022. Took all summer to get rid of the kidney infection which wiped her out and weakned her back.   PRECAUTIONS: No known precautions  SUBJECTIVE:  2-3/10 LBP this morning.      PAIN:  Are you having pain? 2-3/10 low back pain currently     TODAY'S TREATMENT:  Objective     Latex allergies     Therapeutic exercise 12/29/21    Standing low rows red band 15x5 seconds for 3 sets   Standing PNF chops red band  R 10x5 seconds   L 10x5 seconds   Bent over hip extension   R 10x3  L 10x3   Seated on physioball:   Anterior/posterior pelvic tilts. BUE support throughout. Max multimodal cuing for form/technique at pelvis. 10x3 with 5 second holds   Seated marches alternating 10x with B UE support.   Standing with B UE assist  Hip abduction    R 10x5 seconds for 2 sets   L 10x5 seconds for 2 sets  Seated pallof press yellow band   Resistance to the R 5x5 seconds for 3 sets             Resistance to the L 5x5 seconds for 3 sets   Cues to decrease posterior trunk lean. Weak core control observed.       Improved exercise technique, movement at target joints, use of target muscles after mod verbal, visual, tactile cues.       Response to treatment  No back pain reported after session, just fatigue.    Clinical  impression  Continued working on improving core and glute strength secondary to weakness and to decrease stress to low back. Cues to decrease posterior trunk lean during exercises. No back pain reported after session, just fatigue. Pt will benefit from continued skilled physical therapy services to decrease pain, improve strength and function.       PATIENT EDUCATION: Education details: there-ex, HEP Person educated: Patient Education method: Explanation, Demonstration, Tactile cues, Verbal cues, and Handouts Education comprehension: verbalized understanding and returned demonstration   HOME EXERCISE PROGRAM: Access Code: [email or web address] URL: [email or web address] Date: 08/04/2021 Prepared by: Joneen Boers  Exercises - Seated Transversus Abdominis Bracing  - 5 x daily - 7 x weekly - 3 sets - 10 reps - 5 seconds hold - Seated Trunk Rotation - Arms Crossed  - 1 x daily - 7  x weekly - 3 sets - 10 reps - 5 seconds  hold - Standing Gluteal Sets  - 1 x daily - 7 x weekly - 3 sets - 10 reps - 5 seconds hold - Supine Double Knee to Chest  - 1 x daily - 7 x weekly - 2 sets - 15 reps (Discontinued 08/06/2021 secondary to increased symptoms)  - Seated Sciatic Tensioner  - 1 x daily - 7 x weekly - 2 sets - 15 reps (Discontinued 3/303/2023 due to increased symptoms.) - Supine Posterior Pelvic Tilt  - 1 x daily - 7 x weekly - 3 sets - 10 reps - 5 seconds hold -Reclined Hip extension isometrics, leg straight to promote glute max muscle strengthening                                     R 10x5 seconds for 3 sets                                     L 10x5 seconds for 3 sets - Reclined Single Knee to Chest Stretch  - 1 x daily - 7 x weekly - 3 sets - 10 reps - 5 seconds hold   Standing with B UE assist, one foot on 2nd stair step with glute max squeeze to promote stretch of hip flexor muscles on stance LE  Stance LE: R 10x5 seconds for 3 sets    L 10x5 seconds for 3 sets  - Standing  Bilateral Low Shoulder Row with Anchored Resistance  - 1 x daily - 7 x weekly - 3 sets - 10 reps - 5 seconds hold  - Standing Hip Abduction with Resistance at Ankles and Counter Support  - 1 x daily - 7 x weekly - 3 sets - 10 reps (Yellow latex free band)      PT Short Term Goals - 08/11/21 1152       PT SHORT TERM GOAL #1   Title Pt will be independent with her initial HEP to decrease pain, improve strength and ability to ambulate longer distances.    Baseline Pt has started her initial HEP (07/09/2021); Has been doing her HEP. Does not have questions (08/11/2021)    Time 3    Period Weeks    Status Achieved    Target Date 07/30/21              PT Long Term Goals - 12/15/21 1020       PT LONG TERM GOAL #1   Title Pt will have a decrease in low back pain to 4/10 or less at worst to promote ability to ambulate longer distances as well as perform standing tasks more comfortably.    Baseline 9/10 low back pain at most for the past 3 months. Pt also demonstrates B L5 radiating symptoms to knees (07/09/2021); 7-8/10 at most for the past 7 days, usually around 5 pm and later (08/11/2021); 7-8/10 at most for the past 7 days, usually in the evening. (09/01/2021); 7.5/10 back pain at most  for the past 7 days (09/24/2021); 10/10 last Friday (other than that, 7-8/10) ( 11/03/2021); 7-8/10 low back pain at most for the past 7 days; has been in the care a lot.  (12/15/2021)    Time 8    Period Weeks    Status  On-going    Target Date 12/31/21      PT LONG TERM GOAL #2   Title Pt will improve B hip extension, abduction and ER strength by at least 1/2 MMT grade to promote ability to perform standing tasks with less back pain.    Baseline Hip extension 4+/5 R, 3+/5 L, hip abduction 4/5 R,  4-/5 L, hip ER 4-/5 R, 3+/5 L (07/09/2021); hip extensoin 4+/5 R, 5/5 L, hip abduction 4+/5 R and L, hip ER 4/5 R, 4/5 L (08/11/2021); hip extension 5/5 R and L, hip abduction 4+/5 R and L, hip ER 4/5 R and L (09/01/2021); hip  extension 5/5 R and L, hip abduction 4+/5 R and L, hip ER 4/5 R and L, pt states L knee is the side that needs a knee replacement (09/24/2021); hip extension 5/5 R, 4+/5 L, hip abduction 4+/5 R, and 4/5 L, hip ER 4/5 R, 4/5 L (11/03/2021)    Time 8    Period Weeks    Status Achieved    Target Date 10/29/21      PT LONG TERM GOAL #3   Title Pt will improve her lumbar FOTO score by at least 10 points as a demonstration of improved function.    Baseline Lumbar Spine FOTO 44 (07/09/2021); Lumbar FOTO 40. Pt might have anwered her back function limitation question differently secondary to pt stating more limited in function but that was before starting PT. Might have answered increased limitation in function during today's session since starting PT instead which may have decrease her score. (08/11/2021).  47 (09/01/2021); 42 (09/24/2021); 40 (12/15/2021)    Time 8    Period Weeks    Status On-going    Target Date 12/31/21           Joneen Boers PT, DPT  12/29/2021, 5:21 PM

## 2021-12-29 NOTE — Therapy (Signed)
OUTPATIENT PHYSICAL THERAPY TREATMENT NOTE      Patient Name: Julia Deleon MRN: [id number] DOB:July 31, 1945, 76 y.o., female Today's Date: 12/29/2021  PCP: Idelle Crouch, MD REFERRING PROVIDER: Diamond Nickel, DO   PT End of Session - 12/29/21 1019     Visit Number 28    Number of Visits 58    Date for PT Re-Evaluation 12/31/21    Authorization Type Medicare Part A&B    Authorization Time Period 11/03/21-01/03/22    PT Start Time 1019    PT Stop Time 1100    PT Time Calculation (min) 41 min    Activity Tolerance Patient tolerated treatment well    Behavior During Therapy WFL for tasks assessed/performed                                         Past Medical History:  Diagnosis Date   Arthritis    Chronic kidney disease    COPD (chronic obstructive pulmonary disease) ([place])    pt denies    Dyspnea    Family history of adverse reaction to anesthesia    father was slow to wake up   Fuch's endothelial dystrophy    GERD (gastroesophageal reflux disease)    Hyperlipidemia    Hypertension    Hypothyroidism    Pneumonia    hx of    PONV (postoperative nausea and vomiting)    slow to wake up and PONV   Past Surgical History:  Procedure Laterality Date   ABDOMINAL HYSTERECTOMY     APPENDECTOMY     BACK SURGERY  09/26/2018   L4-5 PLIF by Dr. Arnoldo Morale   CARDIAC CATHETERIZATION     CHOLECYSTECTOMY     COLONOSCOPY WITH PROPOFOL N/A 10/23/2020   Procedure: COLONOSCOPY WITH PROPOFOL;  Surgeon: Virgel Manifold, MD;  Location: [facility] ENDOSCOPY;  Service: Endoscopy;  Laterality: N/A;   ESOPHAGOGASTRODUODENOSCOPY N/A 10/23/2020   Procedure: ESOPHAGOGASTRODUODENOSCOPY (EGD);  Surgeon: Virgel Manifold, MD;  Location: [facility] ENDOSCOPY;  Service: Endoscopy;  Laterality: N/A;   EYE SURGERY     FOOT SURGERY Right    pin removed left   laser vein surgery     NASAL SINUS SURGERY     RIGHT/LEFT HEART CATH AND CORONARY ANGIOGRAPHY N/A 12/06/2017    Procedure: RIGHT/LEFT HEART CATH AND CORONARY ANGIOGRAPHY;  Surgeon: Yolonda Kida, MD;  Location: [facility];  Service: Cardiovascular;  Laterality: N/A;   ROBOTIC ASSISTED LAPAROSCOPIC SACROCOLPOPEXY Bilateral 07/30/2020   Procedure: XI ROBOTIC ASSISTED LAPAROSCOPIC SACROCOLPOPEXY AND SUPRACERVICAL HYSTERECTOMY WITH BILATERAL SALPINGO OOPHERECTOMY;  Surgeon: Ardis Hughs, MD;  Location: [facility];  Service: Urology;  Laterality: Bilateral;  REQUESTING 4 HRS   TONSILLECTOMY     Patient Active Problem List   Diagnosis Date Noted   Essential hypertension 01/23/2021   Cystocele with prolapse 07/30/2020   Postsurgical arthrodesis status 07/25/2020   Facet arthropathy 04/08/2020   Family history of breast cancer 11/16/2019   Body mass index (BMI) 25.0-25.9, adult 07/24/2019   Chronic UTI 12/06/2018   Status post lumbar surgery 12/06/2018   Spondylolisthesis, lumbar region 09/26/2018   Aortic atherosclerosis ([place]) 08/31/2018   Food sensitivity headache 03/29/2018   CAD (coronary artery disease) 02/23/2018   Advanced care planning/counseling discussion 03/28/2017   Varicose veins of both lower extremities with complications 80/32/1224   Arthritis 12/09/2016   Vaginal atrophy 09/10/2015   Vasomotor symptoms  due to menopause 09/10/2015   Onychomycosis due to dermatophyte 04/07/2015   Bursitis of right shoulder 02/26/2015   Environmental and seasonal allergies 02/26/2015   Other allergic rhinitis 02/26/2015   Osteoporosis 11/26/2014   COPD (chronic obstructive pulmonary disease) ([place]) 11/26/2014   Fuchs' corneal dystrophy 11/26/2014   Benign hypertensive renal disease 11/26/2014   Hyperlipidemia 11/26/2014   Chronic kidney disease, stage III (moderate) ([place]) 11/26/2014   Hypothyroidism 11/26/2014   Endothelial corneal dystrophy 11/26/2014     REFERRING DIAG: Chronic L low back pain  THERAPY DIAG:  Chronic bilateral low back pain, unspecified whether sciatica  present  Muscle weakness (generalized)  Difficulty in walking, not elsewhere classified  Radiculopathy, lumbar region  PERTINENT HISTORY: Chronic low back pain. Had L4/5 fusion in her low back May 2020 which helped her sleep. Had PT following surgery which helped. Bowel and bladder are not perfect but improving. Had pelvic floor therapy which did not help. The bowel issues might have started after the back surgery. Feels like her urinary tract system quit due to the pain after back surgery. Was placed on a catheter for 6 months after the surgery. Denies saddle anesthesia. Pt also states having a kidney infection May 2022 and was diagnosed at the end of [place] 2022. Took all summer to get rid of the kidney infection which wiped her out and weakned her back.   PRECAUTIONS: No known precautions  SUBJECTIVE:  2-3/10 LBP this morning.      PAIN:  Are you having pain? 2-3/10 low back pain currently     TODAY'S TREATMENT:  Objective     Latex allergies     Therapeutic exercise 12/29/21    Standing low rows red band 15x5 seconds for 3 sets   Standing PNF chops red band  R 10x5 seconds   L 10x5 seconds   Bent over hip extension   R 10x3  L 10x3   Seated on physioball:   Anterior/posterior pelvic tilts. BUE support throughout. Max multimodal cuing for form/technique at pelvis. 10x3 with 5 second holds   Seated marches alternating 10x with B UE support.   Standing with B UE assist  Hip abduction    R 10x5 seconds for 2 sets   L 10x5 seconds for 2 sets  Seated pallof press yellow band   Resistance to the R 5x5 seconds for 3 sets             Resistance to the L 5x5 seconds for 3 sets   Cues to decrease posterior trunk lean. Weak core control observed.       Improved exercise technique, movement at target joints, use of target muscles after mod verbal, visual, tactile cues.       Response to treatment  No back pain reported after session, just fatigue.     Clinical impression  Continued working on improving core and glute strength secondary to weakness and to decrease stress to low back. Cues to decrease posterior trunk lean during exercises. No back pain reported after session, just fatigue. Pt will benefit from continued skilled physical therapy services to decrease pain, improve strength and function.       PATIENT EDUCATION: Education details: there-ex, HEP Person educated: Patient Education method: Explanation, Demonstration, Tactile cues, Verbal cues, and Handouts Education comprehension: verbalized understanding and returned demonstration   HOME EXERCISE PROGRAM: Access Code: [email or web address] URL: [email or web address] Date: 08/04/2021 Prepared by: Joneen Boers  Exercises - Seated Transversus Abdominis Bracing  - 5 x daily -  7 x weekly - 3 sets - 10 reps - 5 seconds hold - Seated Trunk Rotation - Arms Crossed  - 1 x daily - 7 x weekly - 3 sets - 10 reps - 5 seconds  hold - Standing Gluteal Sets  - 1 x daily - 7 x weekly - 3 sets - 10 reps - 5 seconds hold - Supine Double Knee to Chest  - 1 x daily - 7 x weekly - 2 sets - 15 reps (Discontinued 08/06/2021 secondary to increased symptoms)  - Seated Sciatic Tensioner  - 1 x daily - 7 x weekly - 2 sets - 15 reps (Discontinued 3/303/2023 due to increased symptoms.) - Supine Posterior Pelvic Tilt  - 1 x daily - 7 x weekly - 3 sets - 10 reps - 5 seconds hold -Reclined Hip extension isometrics, leg straight to promote glute max muscle strengthening                                     R 10x5 seconds for 3 sets                                     L 10x5 seconds for 3 sets - Reclined Single Knee to Chest Stretch  - 1 x daily - 7 x weekly - 3 sets - 10 reps - 5 seconds hold   Standing with B UE assist, one foot on 2nd stair step with glute max squeeze to promote stretch of hip flexor muscles on stance LE  Stance LE: R 10x5 seconds for 3 sets    L 10x5 seconds for 3 sets  -  Standing Bilateral Low Shoulder Row with Anchored Resistance  - 1 x daily - 7 x weekly - 3 sets - 10 reps - 5 seconds hold  - Standing Hip Abduction with Resistance at Ankles and Counter Support  - 1 x daily - 7 x weekly - 3 sets - 10 reps (Yellow latex free band)    PT Short Term Goals - 08/11/21 1152       PT SHORT TERM GOAL #1   Title Pt will be independent with her initial HEP to decrease pain, improve strength and ability to ambulate longer distances.    Baseline Pt has started her initial HEP (07/09/2021); Has been doing her HEP. Does not have questions (08/11/2021)    Time 3    Period Weeks    Status Achieved    Target Date 07/30/21              PT Long Term Goals - 12/15/21 1020       PT LONG TERM GOAL #1   Title Pt will have a decrease in low back pain to 4/10 or less at worst to promote ability to ambulate longer distances as well as perform standing tasks more comfortably.    Baseline 9/10 low back pain at most for the past 3 months. Pt also demonstrates B L5 radiating symptoms to knees (07/09/2021); 7-8/10 at most for the past 7 days, usually around 5 pm and later (08/11/2021); 7-8/10 at most for the past 7 days, usually in the evening. (09/01/2021); 7.5/10 back pain at most  for the past 7 days (09/24/2021); 10/10 last Friday (other than that, 7-8/10) ( 11/03/2021); 7-8/10 low back pain at most for the past  7 days; has been in the care a lot.  (12/15/2021)    Time 8    Period Weeks    Status On-going    Target Date 12/31/21      PT LONG TERM GOAL #2   Title Pt will improve B hip extension, abduction and ER strength by at least 1/2 MMT grade to promote ability to perform standing tasks with less back pain.    Baseline Hip extension 4+/5 R, 3+/5 L, hip abduction 4/5 R,  4-/5 L, hip ER 4-/5 R, 3+/5 L (07/09/2021); hip extensoin 4+/5 R, 5/5 L, hip abduction 4+/5 R and L, hip ER 4/5 R, 4/5 L (08/11/2021); hip extension 5/5 R and L, hip abduction 4+/5 R and L, hip ER 4/5 R and L (09/01/2021);  hip extension 5/5 R and L, hip abduction 4+/5 R and L, hip ER 4/5 R and L, pt states L knee is the side that needs a knee replacement (09/24/2021); hip extension 5/5 R, 4+/5 L, hip abduction 4+/5 R, and 4/5 L, hip ER 4/5 R, 4/5 L (11/03/2021)    Time 8    Period Weeks    Status Achieved    Target Date 10/29/21      PT LONG TERM GOAL #3   Title Pt will improve her lumbar FOTO score by at least 10 points as a demonstration of improved function.    Baseline Lumbar Spine FOTO 44 (07/09/2021); Lumbar FOTO 40. Pt might have anwered her back function limitation question differently secondary to pt stating more limited in function but that was before starting PT. Might have answered increased limitation in function during today's session since starting PT instead which may have decrease her score. (08/11/2021).  47 (09/01/2021); 42 (09/24/2021); 40 (12/15/2021)    Time 8    Period Weeks    Status On-going    Target Date 12/31/21              Plan - 12/29/21 1021     Clinical Impression Statement Continued working on improving core and glute strength secondary to weakness and to decrease stress to low back. Cues to decrease posterior trunk lean during exercises. No back pain reported after session, just fatigue. Pt will benefit from continued skilled physical therapy services to decrease pain, improve strength and function.    Personal Factors and Comorbidities Age;Comorbidity 3+;Fitness;Past/Current Experience;Time since onset of injury/illness/exacerbation    Comorbidities B knee pain, arthritis, chronic kidney disease, COPD, dyspnea, HTN    Examination-Activity Limitations Squat;Lift;Stairs;Locomotion Level;Stand;Reach Overhead;Carry    Stability/Clinical Decision Making Stable/Uncomplicated    Rehab Potential Fair    PT Frequency 2x / week    PT Duration 8 weeks    PT Treatment/Interventions Therapeutic activities;Therapeutic exercise;Balance training;Neuromuscular re-education;Patient/family  education;Manual techniques;Dry needling;Aquatic Therapy;Electrical Stimulation;Iontophoresis '4mg'$ /ml Dexamethasone    PT Next Visit Plan Posture, trunk and hip strengthening, manual techniques, modalities PRN    PT Home Exercise Plan Medbridge Access Code [email or web address]    Consulted and Agree with Plan of Care Patient                                        Joneen Boers PT, DPT  12/29/2021, 5:23 PM

## 2021-12-31 ENCOUNTER — Ambulatory Visit: Payer: Medicare Other

## 2021-12-31 DIAGNOSIS — G8929 Other chronic pain: Secondary | ICD-10-CM

## 2021-12-31 DIAGNOSIS — M545 Low back pain, unspecified: Secondary | ICD-10-CM | POA: Diagnosis not present

## 2021-12-31 DIAGNOSIS — M6281 Muscle weakness (generalized): Secondary | ICD-10-CM

## 2021-12-31 DIAGNOSIS — M5416 Radiculopathy, lumbar region: Secondary | ICD-10-CM

## 2021-12-31 DIAGNOSIS — R262 Difficulty in walking, not elsewhere classified: Secondary | ICD-10-CM

## 2021-12-31 NOTE — Therapy (Signed)
OUTPATIENT PHYSICAL THERAPY TREATMENT NOTE      Patient Name: Julia Deleon MRN: [id number] DOB:09/17/1945, 76 y.o., female Today's Date: 12/31/2021  PCP: Idelle Crouch, MD REFERRING PROVIDER: Diamond Nickel, DO   PT End of Session - 12/31/21 1018     Visit Number 19    Number of Visits 74    Date for PT Re-Evaluation 02/25/22    Authorization Type Medicare Part A&B    Authorization Time Period --    PT Start Time 1019    PT Stop Time 1103    PT Time Calculation (min) 44 min    Activity Tolerance Patient tolerated treatment well    Behavior During Therapy WFL for tasks assessed/performed                                          Past Medical History:  Diagnosis Date   Arthritis    Chronic kidney disease    COPD (chronic obstructive pulmonary disease) ([place])    pt denies    Dyspnea    Family history of adverse reaction to anesthesia    father was slow to wake up   Fuch's endothelial dystrophy    GERD (gastroesophageal reflux disease)    Hyperlipidemia    Hypertension    Hypothyroidism    Pneumonia    hx of    PONV (postoperative nausea and vomiting)    slow to wake up and PONV   Past Surgical History:  Procedure Laterality Date   ABDOMINAL HYSTERECTOMY     APPENDECTOMY     BACK SURGERY  09/26/2018   L4-5 PLIF by Dr. Arnoldo Morale   CARDIAC CATHETERIZATION     CHOLECYSTECTOMY     COLONOSCOPY WITH PROPOFOL N/A 10/23/2020   Procedure: COLONOSCOPY WITH PROPOFOL;  Surgeon: Virgel Manifold, MD;  Location: [facility] ENDOSCOPY;  Service: Endoscopy;  Laterality: N/A;   ESOPHAGOGASTRODUODENOSCOPY N/A 10/23/2020   Procedure: ESOPHAGOGASTRODUODENOSCOPY (EGD);  Surgeon: Virgel Manifold, MD;  Location: [facility] ENDOSCOPY;  Service: Endoscopy;  Laterality: N/A;   EYE SURGERY     FOOT SURGERY Right    pin removed left   laser vein surgery     NASAL SINUS SURGERY     RIGHT/LEFT HEART CATH AND CORONARY ANGIOGRAPHY N/A 12/06/2017   Procedure:  RIGHT/LEFT HEART CATH AND CORONARY ANGIOGRAPHY;  Surgeon: Yolonda Kida, MD;  Location: [facility];  Service: Cardiovascular;  Laterality: N/A;   ROBOTIC ASSISTED LAPAROSCOPIC SACROCOLPOPEXY Bilateral 07/30/2020   Procedure: XI ROBOTIC ASSISTED LAPAROSCOPIC SACROCOLPOPEXY AND SUPRACERVICAL HYSTERECTOMY WITH BILATERAL SALPINGO OOPHERECTOMY;  Surgeon: Ardis Hughs, MD;  Location: [facility];  Service: Urology;  Laterality: Bilateral;  REQUESTING 4 HRS   TONSILLECTOMY     Patient Active Problem List   Diagnosis Date Noted   Essential hypertension 01/23/2021   Cystocele with prolapse 07/30/2020   Postsurgical arthrodesis status 07/25/2020   Facet arthropathy 04/08/2020   Family history of breast cancer 11/16/2019   Body mass index (BMI) 25.0-25.9, adult 07/24/2019   Chronic UTI 12/06/2018   Status post lumbar surgery 12/06/2018   Spondylolisthesis, lumbar region 09/26/2018   Aortic atherosclerosis ([place]) 08/31/2018   Food sensitivity headache 03/29/2018   CAD (coronary artery disease) 02/23/2018   Advanced care planning/counseling discussion 03/28/2017   Varicose veins of both lower extremities with complications 94/76/5465   Arthritis 12/09/2016   Vaginal atrophy 09/10/2015   Vasomotor  symptoms due to menopause 09/10/2015   Onychomycosis due to dermatophyte 04/07/2015   Bursitis of right shoulder 02/26/2015   Environmental and seasonal allergies 02/26/2015   Other allergic rhinitis 02/26/2015   Osteoporosis 11/26/2014   COPD (chronic obstructive pulmonary disease) ([place]) 11/26/2014   Fuchs' corneal dystrophy 11/26/2014   Benign hypertensive renal disease 11/26/2014   Hyperlipidemia 11/26/2014   Chronic kidney disease, stage III (moderate) ([place]) 11/26/2014   Hypothyroidism 11/26/2014   Endothelial corneal dystrophy 11/26/2014     REFERRING DIAG: Chronic L low back pain  THERAPY DIAG:  Chronic bilateral low back pain, unspecified whether sciatica present  Muscle  weakness (generalized)  Difficulty in walking, not elsewhere classified  Radiculopathy, lumbar region  PERTINENT HISTORY: Chronic low back pain. Had L4/5 fusion in her low back May 2020 which helped her sleep. Had PT following surgery which helped. Bowel and bladder are not perfect but improving. Had pelvic floor therapy which did not help. The bowel issues might have started after the back surgery. Feels like her urinary tract system quit due to the pain after back surgery. Was placed on a catheter for 6 months after the surgery. Denies saddle anesthesia. Pt also states having a kidney infection May 2022 and was diagnosed at the end of Renay 2022. Took all summer to get rid of the kidney infection which wiped her out and weakned her back.   PRECAUTIONS: No known precautions  SUBJECTIVE:  3/10 LBP currently.... took the bed sheets off and put them in the laundry this morning. A hair better for her back yesterday when standing in the kitchen making her supper.      PAIN:  Are you having pain? 3/10 low back pain currently     TODAY'S TREATMENT:  Objective     Latex allergies     Therapeutic exercise 12/31/21    Reviewed POC: continue 2x/week for 8 weeks  Seated pallof press yellow band   Resistance to the R 5x5 seconds for 3 sets             Resistance to the L 5x5 seconds for 3 sets   Cues to decrease posterior trunk lean. Weak core control observed.   Sitting with upright posture, dowel along back for tactile cues  Transversus abdominis contraction 10x3 with 5 second hold   Seated hip hinging 10x5  Small movements needed for training.  Tendency for low back hinging. Max verbal, and tactile cues to hinge at hips  PT assist to bend forward at the hips, hands on R lateral pelvis  Weak/decreased endurance of postural muscles observed.        Improved exercise technique, movement at target joints, use of target muscles after mod verbal, visual, tactile cues.     Manual  therapy   Seated STM  B lumbar paraspinal muscles R > L to decrease tension  Decreased tightness reported afterwards   Response to treatment  Fair tolerance to today's session   Clinical impression  Pt demonstrates difficulty with core strength, as well as lumbopelvic control. Demonstrates tendency for increase hinging at lumbar area instead of hips when moving. Max cues needed to address. Improved function based on improved FOTO score compared to previous measurement as well as based on subjective reports of being better able to stand in the kitchen and prepare her supper by a "hair." Pt making very slow progress towards goals. Challenges to progress include chronicity of symptoms, age, as well as history of pt taking over a year prior  to improving with PT 2 years ago. Fair tolerance to today's session. Pt will benefit from continued skilled physical therapy services to decrease pain, improve strength and function.       PATIENT EDUCATION: Education details: there-ex, HEP Person educated: Patient Education method: Explanation, Demonstration, Tactile cues, Verbal cues, and Handouts Education comprehension: verbalized understanding and returned demonstration   HOME EXERCISE PROGRAM: Access Code: [email or web address] URL: [email or web address] Date: 08/04/2021 Prepared by: Joneen Boers  Exercises - Seated Transversus Abdominis Bracing  - 5 x daily - 7 x weekly - 3 sets - 10 reps - 5 seconds hold - Seated Trunk Rotation - Arms Crossed  - 1 x daily - 7 x weekly - 3 sets - 10 reps - 5 seconds  hold - Standing Gluteal Sets  - 1 x daily - 7 x weekly - 3 sets - 10 reps - 5 seconds hold - Supine Double Knee to Chest  - 1 x daily - 7 x weekly - 2 sets - 15 reps (Discontinued 08/06/2021 secondary to increased symptoms)  - Seated Sciatic Tensioner  - 1 x daily - 7 x weekly - 2 sets - 15 reps (Discontinued 3/303/2023 due to increased symptoms.) - Supine Posterior Pelvic Tilt  - 1 x daily -  7 x weekly - 3 sets - 10 reps - 5 seconds hold -Reclined Hip extension isometrics, leg straight to promote glute max muscle strengthening                                     R 10x5 seconds for 3 sets                                     L 10x5 seconds for 3 sets - Reclined Single Knee to Chest Stretch  - 1 x daily - 7 x weekly - 3 sets - 10 reps - 5 seconds hold   Standing with B UE assist, one foot on 2nd stair step with glute max squeeze to promote stretch of hip flexor muscles on stance LE  Stance LE: R 10x5 seconds for 3 sets    L 10x5 seconds for 3 sets  - Standing Bilateral Low Shoulder Row with Anchored Resistance  - 1 x daily - 7 x weekly - 3 sets - 10 reps - 5 seconds hold  - Standing Hip Abduction with Resistance at Ankles and Counter Support  - 1 x daily - 7 x weekly - 3 sets - 10 reps (Yellow latex free band)    PT Short Term Goals - 08/11/21 1152       PT SHORT TERM GOAL #1   Title Pt will be independent with her initial HEP to decrease pain, improve strength and ability to ambulate longer distances.    Baseline Pt has started her initial HEP (07/09/2021); Has been doing her HEP. Does not have questions (08/11/2021)    Time 3    Period Weeks    Status Achieved    Target Date 07/30/21              PT Long Term Goals - 12/31/21 1022       PT LONG TERM GOAL #1   Title Pt will have a decrease in low back pain to 4/10 or less at worst to promote ability to ambulate longer distances as well  as perform standing tasks more comfortably.    Baseline 9/10 low back pain at most for the past 3 months. Pt also demonstrates B L5 radiating symptoms to knees (07/09/2021); 7-8/10 at most for the past 7 days, usually around 5 pm and later (08/11/2021); 7-8/10 at most for the past 7 days, usually in the evening. (09/01/2021); 7.5/10 back pain at most  for the past 7 days (09/24/2021); 10/10 last Friday (other than that, 7-8/10) ( 11/03/2021); 7-8/10 low back pain at most for the past 7 days; has  been in the care a lot.  (12/15/2021); 8/10 at worst for the past 7 days (12/31/2021)    Time 8    Period Weeks    Status On-going    Target Date 02/25/22      PT LONG TERM GOAL #2   Title Pt will improve B hip extension, abduction and ER strength by at least 1/2 MMT grade to promote ability to perform standing tasks with less back pain.    Baseline Hip extension 4+/5 R, 3+/5 L, hip abduction 4/5 R,  4-/5 L, hip ER 4-/5 R, 3+/5 L (07/09/2021); hip extensoin 4+/5 R, 5/5 L, hip abduction 4+/5 R and L, hip ER 4/5 R, 4/5 L (08/11/2021); hip extension 5/5 R and L, hip abduction 4+/5 R and L, hip ER 4/5 R and L (09/01/2021); hip extension 5/5 R and L, hip abduction 4+/5 R and L, hip ER 4/5 R and L, pt states L knee is the side that needs a knee replacement (09/24/2021); hip extension 5/5 R, 4+/5 L, hip abduction 4+/5 R, and 4/5 L, hip ER 4/5 R, 4/5 L (11/03/2021)    Time 8    Period Weeks    Status Achieved    Target Date 10/29/21      PT LONG TERM GOAL #3   Title Pt will improve her lumbar FOTO score by at least 10 points as a demonstration of improved function.    Baseline Lumbar Spine FOTO 44 (07/09/2021); Lumbar FOTO 40. Pt might have anwered her back function limitation question differently secondary to pt stating more limited in function but that was before starting PT. Might have answered increased limitation in function during today's session since starting PT instead which may have decrease her score. (08/11/2021).  47 (09/01/2021); 42 (09/24/2021); 40 (12/15/2021); 47 (12/31/2021)    Time 8    Period Weeks    Status On-going    Target Date 12/31/21              Plan - 12/31/21 1017     Clinical Impression Statement Pt demonstrates difficulty with core strength, as well as lumbopelvic control. Demonstrates tendency for increase hinging at lumbar area instead of hips when moving. Max cues needed to address. Improved function based on improved FOTO score compared to previous measurement as well as based  on subjective reports of being better able to stand in the kitchen and prepare her supper by a "hair." Pt making very slow progress towards goals. Challenges to progress include chronicity of symptoms, age, as well as history of pt taking over a year prior to improving with PT 2 years ago. Fair tolerance to today's session. Pt will benefit from continued skilled physical therapy services to decrease pain, improve strength and function.    Personal Factors and Comorbidities Age;Comorbidity 3+;Fitness;Past/Current Experience;Time since onset of injury/illness/exacerbation    Comorbidities B knee pain, arthritis, chronic kidney disease, COPD, dyspnea, HTN    Examination-Activity Limitations Squat;Lift;Stairs;Locomotion Level;Stand;Reach  Overhead;Carry    Stability/Clinical Decision Making Stable/Uncomplicated    Clinical Decision Making Low    Rehab Potential Fair    PT Frequency 2x / week    PT Duration 8 weeks    PT Treatment/Interventions Therapeutic activities;Therapeutic exercise;Balance training;Neuromuscular re-education;Patient/family education;Manual techniques;Dry needling;Aquatic Therapy;Electrical Stimulation;Iontophoresis '4mg'$ /ml Dexamethasone    PT Next Visit Plan Posture, trunk and hip strengthening, manual techniques, modalities PRN    PT Home Exercise Plan Medbridge Access Code [email or web address]    Consulted and Agree with Plan of Care Patient                                         Joneen Boers PT, DPT  12/31/2021, 12:22 PM

## 2022-01-05 ENCOUNTER — Ambulatory Visit: Payer: Medicare Other

## 2022-01-05 DIAGNOSIS — R262 Difficulty in walking, not elsewhere classified: Secondary | ICD-10-CM

## 2022-01-05 DIAGNOSIS — M6281 Muscle weakness (generalized): Secondary | ICD-10-CM

## 2022-01-05 DIAGNOSIS — M5416 Radiculopathy, lumbar region: Secondary | ICD-10-CM

## 2022-01-05 DIAGNOSIS — M62838 Other muscle spasm: Secondary | ICD-10-CM

## 2022-01-05 DIAGNOSIS — M545 Low back pain, unspecified: Secondary | ICD-10-CM

## 2022-01-05 NOTE — Therapy (Signed)
OUTPATIENT PHYSICAL THERAPY TREATMENT NOTE      Patient Name: Julia Deleon MRN: [id number] DOB:August 16, 1945, 76 y.o., female Today's Date: 01/05/2022  PCP: Idelle Crouch, MD REFERRING PROVIDER: Diamond Nickel, DO   PT End of Session - 01/05/22 1019     Visit Number 44    Number of Visits 66    Date for PT Re-Evaluation 02/25/22    Authorization Type Medicare Part A&B    PT Start Time 1019    PT Stop Time 1100    PT Time Calculation (min) 41 min    Activity Tolerance Patient tolerated treatment well    Behavior During Therapy WFL for tasks assessed/performed                                           Past Medical History:  Diagnosis Date   Arthritis    Chronic kidney disease    COPD (chronic obstructive pulmonary disease) ([place])    pt denies    Dyspnea    Family history of adverse reaction to anesthesia    father was slow to wake up   Fuch's endothelial dystrophy    GERD (gastroesophageal reflux disease)    Hyperlipidemia    Hypertension    Hypothyroidism    Pneumonia    hx of    PONV (postoperative nausea and vomiting)    slow to wake up and PONV   Past Surgical History:  Procedure Laterality Date   ABDOMINAL HYSTERECTOMY     APPENDECTOMY     BACK SURGERY  09/26/2018   L4-5 PLIF by Dr. Arnoldo Morale   CARDIAC CATHETERIZATION     CHOLECYSTECTOMY     COLONOSCOPY WITH PROPOFOL N/A 10/23/2020   Procedure: COLONOSCOPY WITH PROPOFOL;  Surgeon: Virgel Manifold, MD;  Location: [facility] ENDOSCOPY;  Service: Endoscopy;  Laterality: N/A;   ESOPHAGOGASTRODUODENOSCOPY N/A 10/23/2020   Procedure: ESOPHAGOGASTRODUODENOSCOPY (EGD);  Surgeon: Virgel Manifold, MD;  Location: [facility] ENDOSCOPY;  Service: Endoscopy;  Laterality: N/A;   EYE SURGERY     FOOT SURGERY Right    pin removed left   laser vein surgery     NASAL SINUS SURGERY     RIGHT/LEFT HEART CATH AND CORONARY ANGIOGRAPHY N/A 12/06/2017   Procedure: RIGHT/LEFT HEART CATH AND  CORONARY ANGIOGRAPHY;  Surgeon: Yolonda Kida, MD;  Location: [facility] [facility];  Service: Cardiovascular;  Laterality: N/A;   ROBOTIC ASSISTED LAPAROSCOPIC SACROCOLPOPEXY Bilateral 07/30/2020   Procedure: XI ROBOTIC ASSISTED LAPAROSCOPIC SACROCOLPOPEXY AND SUPRACERVICAL HYSTERECTOMY WITH BILATERAL SALPINGO OOPHERECTOMY;  Surgeon: Ardis Hughs, MD;  Location: [facility];  Service: Urology;  Laterality: Bilateral;  REQUESTING 4 HRS   TONSILLECTOMY     Patient Active Problem List   Diagnosis Date Noted   Essential hypertension 01/23/2021   Cystocele with prolapse 07/30/2020   Postsurgical arthrodesis status 07/25/2020   Facet arthropathy 04/08/2020   Family history of breast cancer 11/16/2019   Body mass index (BMI) 25.0-25.9, adult 07/24/2019   Chronic UTI 12/06/2018   Status post lumbar surgery 12/06/2018   Spondylolisthesis, lumbar region 09/26/2018   Aortic atherosclerosis ([place]) 08/31/2018   Food sensitivity headache 03/29/2018   CAD (coronary artery disease) 02/23/2018   Advanced care planning/counseling discussion 03/28/2017   Varicose veins of both lower extremities with complications 07/62/2633   Arthritis 12/09/2016   Vaginal atrophy 09/10/2015   Vasomotor symptoms due to menopause 09/10/2015  Onychomycosis due to dermatophyte 04/07/2015   Bursitis of right shoulder 02/26/2015   Environmental and seasonal allergies 02/26/2015   Other allergic rhinitis 02/26/2015   Osteoporosis 11/26/2014   COPD (chronic obstructive pulmonary disease) ([place]) 11/26/2014   Fuchs' corneal dystrophy 11/26/2014   Benign hypertensive renal disease 11/26/2014   Hyperlipidemia 11/26/2014   Chronic kidney disease, stage III (moderate) ([place]) 11/26/2014   Hypothyroidism 11/26/2014   Endothelial corneal dystrophy 11/26/2014     REFERRING DIAG: Chronic L low back pain  THERAPY DIAG:  Chronic bilateral low back pain, unspecified whether sciatica present  Muscle weakness  (generalized)  Difficulty in walking, not elsewhere classified  Radiculopathy, lumbar region  PERTINENT HISTORY: Chronic low back pain. Had L4/5 fusion in her low back May 2020 which helped her sleep. Had PT following surgery which helped. Bowel and bladder are not perfect but improving. Had pelvic floor therapy which did not help. The bowel issues might have started after the back surgery. Feels like her urinary tract system quit due to the pain after back surgery. Was placed on a catheter for 6 months after the surgery. Denies saddle anesthesia. Pt also states having a kidney infection May 2022 and was diagnosed at the end of [place] 2022. Took all summer to get rid of the kidney infection which wiped her out and weakned her back.   PRECAUTIONS: No known precautions  SUBJECTIVE:  about a 3/10 LBP currently.... its the morning. Had R great toe nail taken off last week because it was dead for over 5 years and was getting thicker and thicker.     PAIN:  Are you having pain? 3/10 low back pain currently     TODAY'S TREATMENT:  Objective     Latex allergies     Therapeutic exercise 01/05/22    Standing low rows red band 15x5 seconds for 3 sets   Standing PNF chops red band             R 10x5 seconds for 3 sets             L 10x5 seconds for 3 sets   Bent over hip extension              R 10x3             L 10x3   Seated on physioball:              Anterior/posterior pelvic tilts. BUE support throughout.  10x3   Seated marches alternating 10x3 with B UE support.      Improved exercise technique, movement at target joints, use of target muscles after mod verbal, visual, tactile cues.     Manual therapy  Seated STM  B lumbar paraspinal muscles R > L to decrease tension  Decreased LE tightness reported afterwards   Response to treatment  Fair tolerance to today's session   Clinical impression  Continued working on improving core strength secondary to weakness and  low endurance of postural muscles. Decreased R LE tightness after manual therapy to decrease R lumbar paraspinal muscle tension. Pt will benefit from continued skilled physical therapy services to decrease pain, improve strength and function.     PATIENT EDUCATION: Education details: there-ex, HEP Person educated: Patient Education method: Explanation, Demonstration, Tactile cues, Verbal cues, and Handouts Education comprehension: verbalized understanding and returned demonstration   HOME EXERCISE PROGRAM: Access Code: [email or web address] URL: [email or web address] Date: 08/04/2021 Prepared by: Joneen Boers  Exercises - Seated Transversus Abdominis Bracing  - 5  x daily - 7 x weekly - 3 sets - 10 reps - 5 seconds hold - Seated Trunk Rotation - Arms Crossed  - 1 x daily - 7 x weekly - 3 sets - 10 reps - 5 seconds  hold - Standing Gluteal Sets  - 1 x daily - 7 x weekly - 3 sets - 10 reps - 5 seconds hold - Supine Double Knee to Chest  - 1 x daily - 7 x weekly - 2 sets - 15 reps (Discontinued 08/06/2021 secondary to increased symptoms)  - Seated Sciatic Tensioner  - 1 x daily - 7 x weekly - 2 sets - 15 reps (Discontinued 3/303/2023 due to increased symptoms.) - Supine Posterior Pelvic Tilt  - 1 x daily - 7 x weekly - 3 sets - 10 reps - 5 seconds hold -Reclined Hip extension isometrics, leg straight to promote glute max muscle strengthening                                     R 10x5 seconds for 3 sets                                     L 10x5 seconds for 3 sets - Reclined Single Knee to Chest Stretch  - 1 x daily - 7 x weekly - 3 sets - 10 reps - 5 seconds hold   Standing with B UE assist, one foot on 2nd stair step with glute max squeeze to promote stretch of hip flexor muscles on stance LE  Stance LE: R 10x5 seconds for 3 sets    L 10x5 seconds for 3 sets  - Standing Bilateral Low Shoulder Row with Anchored Resistance  - 1 x daily - 7 x weekly - 3 sets - 10 reps - 5 seconds  hold  - Standing Hip Abduction with Resistance at Ankles and Counter Support  - 1 x daily - 7 x weekly - 3 sets - 10 reps (Yellow latex free band)    PT Short Term Goals - 08/11/21 1152       PT SHORT TERM GOAL #1   Title Pt will be independent with her initial HEP to decrease pain, improve strength and ability to ambulate longer distances.    Baseline Pt has started her initial HEP (07/09/2021); Has been doing her HEP. Does not have questions (08/11/2021)    Time 3    Period Weeks    Status Achieved    Target Date 07/30/21              PT Long Term Goals - 12/31/21 1022       PT LONG TERM GOAL #1   Title Pt will have a decrease in low back pain to 4/10 or less at worst to promote ability to ambulate longer distances as well as perform standing tasks more comfortably.    Baseline 9/10 low back pain at most for the past 3 months. Pt also demonstrates B L5 radiating symptoms to knees (07/09/2021); 7-8/10 at most for the past 7 days, usually around 5 pm and later (08/11/2021); 7-8/10 at most for the past 7 days, usually in the evening. (09/01/2021); 7.5/10 back pain at most  for the past 7 days (09/24/2021); 10/10 last Friday (other than that, 7-8/10) ( 11/03/2021); 7-8/10 low back pain at most  for the past 7 days; has been in the care a lot.  (12/15/2021); 8/10 at worst for the past 7 days (12/31/2021)    Time 8    Period Weeks    Status On-going    Target Date 02/25/22      PT LONG TERM GOAL #2   Title Pt will improve B hip extension, abduction and ER strength by at least 1/2 MMT grade to promote ability to perform standing tasks with less back pain.    Baseline Hip extension 4+/5 R, 3+/5 L, hip abduction 4/5 R,  4-/5 L, hip ER 4-/5 R, 3+/5 L (07/09/2021); hip extensoin 4+/5 R, 5/5 L, hip abduction 4+/5 R and L, hip ER 4/5 R, 4/5 L (08/11/2021); hip extension 5/5 R and L, hip abduction 4+/5 R and L, hip ER 4/5 R and L (09/01/2021); hip extension 5/5 R and L, hip abduction 4+/5 R and L, hip ER 4/5 R  and L, pt states L knee is the side that needs a knee replacement (09/24/2021); hip extension 5/5 R, 4+/5 L, hip abduction 4+/5 R, and 4/5 L, hip ER 4/5 R, 4/5 L (11/03/2021)    Time 8    Period Weeks    Status Achieved    Target Date 10/29/21      PT LONG TERM GOAL #3   Title Pt will improve her lumbar FOTO score by at least 10 points as a demonstration of improved function.    Baseline Lumbar Spine FOTO 44 (07/09/2021); Lumbar FOTO 40. Pt might have anwered her back function limitation question differently secondary to pt stating more limited in function but that was before starting PT. Might have answered increased limitation in function during today's session since starting PT instead which may have decrease her score. (08/11/2021).  47 (09/01/2021); 42 (09/24/2021); 40 (12/15/2021); 47 (12/31/2021)    Time 8    Period Weeks    Status On-going    Target Date 12/31/21              Plan - 01/05/22 1039     Clinical Impression Statement Continued working on improving core strength secondary to weakness and low endurance of postural muscles. Decreased R LE tightness after manual therapy to decrease R lumbar paraspinal muscle tension. Pt will benefit from continued skilled physical therapy services to decrease pain, improve strength and function.    Personal Factors and Comorbidities Age;Comorbidity 3+;Fitness;Past/Current Experience;Time since onset of injury/illness/exacerbation    Comorbidities B knee pain, arthritis, chronic kidney disease, COPD, dyspnea, HTN    Examination-Activity Limitations Squat;Lift;Stairs;Locomotion Level;Stand;Reach Overhead;Carry    Stability/Clinical Decision Making Stable/Uncomplicated    Rehab Potential Fair    PT Frequency 2x / week    PT Duration 8 weeks    PT Treatment/Interventions Therapeutic activities;Therapeutic exercise;Balance training;Neuromuscular re-education;Patient/family education;Manual techniques;Dry needling;Aquatic Therapy;Electrical  Stimulation;Iontophoresis '4mg'$ /ml Dexamethasone    PT Next Visit Plan Posture, trunk and hip strengthening, manual techniques, modalities PRN    PT Home Exercise Plan Medbridge Access Code [email or web address]    Consulted and Agree with Plan of Care Patient                                          Joneen Boers PT, DPT  01/05/2022, 12:11 PM

## 2022-01-07 ENCOUNTER — Ambulatory Visit: Payer: Medicare Other

## 2022-01-07 DIAGNOSIS — R262 Difficulty in walking, not elsewhere classified: Secondary | ICD-10-CM

## 2022-01-07 DIAGNOSIS — M5416 Radiculopathy, lumbar region: Secondary | ICD-10-CM

## 2022-01-07 DIAGNOSIS — G8929 Other chronic pain: Secondary | ICD-10-CM

## 2022-01-07 DIAGNOSIS — M545 Low back pain, unspecified: Secondary | ICD-10-CM | POA: Diagnosis not present

## 2022-01-07 DIAGNOSIS — M6281 Muscle weakness (generalized): Secondary | ICD-10-CM

## 2022-01-07 NOTE — Therapy (Signed)
OUTPATIENT PHYSICAL THERAPY TREATMENT NOTE      Patient Name: Julia Deleon MRN: [id number] DOB:03/29/1946, 76 y.o., female Today's Date: 01/07/2022  PCP: Idelle Crouch, MD REFERRING PROVIDER: Diamond Nickel, DO   PT End of Session - 01/07/22 1017     Visit Number 80    Number of Visits 43    Date for PT Re-Evaluation 02/25/22    Authorization Type Medicare Part A&B    PT Start Time 1018    PT Stop Time 1059    PT Time Calculation (min) 41 min    Activity Tolerance Patient tolerated treatment well    Behavior During Therapy WFL for tasks assessed/performed                                            Past Medical History:  Diagnosis Date   Arthritis    Chronic kidney disease    COPD (chronic obstructive pulmonary disease) ([place])    pt denies    Dyspnea    Family history of adverse reaction to anesthesia    father was slow to wake up   Fuch's endothelial dystrophy    GERD (gastroesophageal reflux disease)    Hyperlipidemia    Hypertension    Hypothyroidism    Pneumonia    hx of    PONV (postoperative nausea and vomiting)    slow to wake up and PONV   Past Surgical History:  Procedure Laterality Date   ABDOMINAL HYSTERECTOMY     APPENDECTOMY     BACK SURGERY  09/26/2018   L4-5 PLIF by Dr. Arnoldo Morale   CARDIAC CATHETERIZATION     CHOLECYSTECTOMY     COLONOSCOPY WITH PROPOFOL N/A 10/23/2020   Procedure: COLONOSCOPY WITH PROPOFOL;  Surgeon: Virgel Manifold, MD;  Location: [facility] ENDOSCOPY;  Service: Endoscopy;  Laterality: N/A;   ESOPHAGOGASTRODUODENOSCOPY N/A 10/23/2020   Procedure: ESOPHAGOGASTRODUODENOSCOPY (EGD);  Surgeon: Virgel Manifold, MD;  Location: [facility] ENDOSCOPY;  Service: Endoscopy;  Laterality: N/A;   EYE SURGERY     FOOT SURGERY Right    pin removed left   laser vein surgery     NASAL SINUS SURGERY     RIGHT/LEFT HEART CATH AND CORONARY ANGIOGRAPHY N/A 12/06/2017   Procedure: RIGHT/LEFT HEART CATH AND  CORONARY ANGIOGRAPHY;  Surgeon: Yolonda Kida, MD;  Location: [facility];  Service: Cardiovascular;  Laterality: N/A;   ROBOTIC ASSISTED LAPAROSCOPIC SACROCOLPOPEXY Bilateral 07/30/2020   Procedure: XI ROBOTIC ASSISTED LAPAROSCOPIC SACROCOLPOPEXY AND SUPRACERVICAL HYSTERECTOMY WITH BILATERAL SALPINGO OOPHERECTOMY;  Surgeon: Ardis Hughs, MD;  Location: [facility];  Service: Urology;  Laterality: Bilateral;  REQUESTING 4 HRS   TONSILLECTOMY     Patient Active Problem List   Diagnosis Date Noted   Essential hypertension 01/23/2021   Cystocele with prolapse 07/30/2020   Postsurgical arthrodesis status 07/25/2020   Facet arthropathy 04/08/2020   Family history of breast cancer 11/16/2019   Body mass index (BMI) 25.0-25.9, adult 07/24/2019   Chronic UTI 12/06/2018   Status post lumbar surgery 12/06/2018   Spondylolisthesis, lumbar region 09/26/2018   Aortic atherosclerosis ([place]) 08/31/2018   Food sensitivity headache 03/29/2018   CAD (coronary artery disease) 02/23/2018   Advanced care planning/counseling discussion 03/28/2017   Varicose veins of both lower extremities with complications 52/84/1324   Arthritis 12/09/2016   Vaginal atrophy 09/10/2015   Vasomotor symptoms due to menopause 09/10/2015  Onychomycosis due to dermatophyte 04/07/2015   Bursitis of right shoulder 02/26/2015   Environmental and seasonal allergies 02/26/2015   Other allergic rhinitis 02/26/2015   Osteoporosis 11/26/2014   COPD (chronic obstructive pulmonary disease) ([place]) 11/26/2014   Fuchs' corneal dystrophy 11/26/2014   Benign hypertensive renal disease 11/26/2014   Hyperlipidemia 11/26/2014   Chronic kidney disease, stage III (moderate) ([place]) 11/26/2014   Hypothyroidism 11/26/2014   Endothelial corneal dystrophy 11/26/2014     REFERRING DIAG: Chronic L low back pain  THERAPY DIAG:  Chronic bilateral low back pain, unspecified whether sciatica present  Muscle weakness  (generalized)  Difficulty in walking, not elsewhere classified  Radiculopathy, lumbar region  PERTINENT HISTORY: Chronic low back pain. Had L4/5 fusion in her low back May 2020 which helped her sleep. Had PT following surgery which helped. Bowel and bladder are not perfect but improving. Had pelvic floor therapy which did not help. The bowel issues might have started after the back surgery. Feels like her urinary tract system quit due to the pain after back surgery. Was placed on a catheter for 6 months after the surgery. Denies saddle anesthesia. Pt also states having a kidney infection May 2022 and was diagnosed at the end of Chrysta 2022. Took all summer to get rid of the kidney infection which wiped her out and weakned her back.   PRECAUTIONS: No known precautions  SUBJECTIVE:  The usual, about a 3/10 LBP currently. The weather changes do not help any. Maybe slightly easier for her back when standing and making her supper in the evening. Was able to make a casserole last night. Last time she made a casserole was about 3-4 years ago.     PAIN:  Are you having pain? 3/10 low back pain currently     TODAY'S TREATMENT:  Objective     Latex allergies     Therapeutic exercise 01/07/22    Standing low rows red band 15x5 seconds for 3 sets    Standing PNF chops red band             R 10x5 seconds for 3 sets             L 10x5 seconds for 3 sets   Bent over hip extension              R 10x3             L 10x3   Seated on physioball:              Anterior/posterior pelvic tilts. L to B UE support throughout.  10x3   Seated marches alternating 10x3 with B UE support.      Improved exercise technique, movement at target joints, use of target muscles after mod verbal, visual, tactile cues.     Manual therapy  Seated STM  B lumbar paraspinal muscles R > L to decrease tension  Decreased R and L LE tightness reported afterwards   Response to treatment  Decreased B LE  tightness reported after manual therapy to decrease B lumbar paraspinal muscle tension.     Clinical impression  Possible improvement in ability to stand and prepare supper based on subjective reports. Pt seems cautiously optimistic. Continued working on improving core strength secondary to weakness and low endurance of postural muscles. Decreased R and L LE tightness after manual therapy to decrease R lumbar paraspinal muscle tension. Pt will benefit from continued skilled physical therapy services to decrease pain, improve strength and function.  PATIENT EDUCATION: Education details: there-ex, HEP Person educated: Patient Education method: Explanation, Demonstration, Tactile cues, Verbal cues, and Handouts Education comprehension: verbalized understanding and returned demonstration   HOME EXERCISE PROGRAM: Access Code: [email or web address] URL: [email or web address] Date: 08/04/2021 Prepared by: Joneen Boers  Exercises - Seated Transversus Abdominis Bracing  - 5 x daily - 7 x weekly - 3 sets - 10 reps - 5 seconds hold - Seated Trunk Rotation - Arms Crossed  - 1 x daily - 7 x weekly - 3 sets - 10 reps - 5 seconds  hold - Standing Gluteal Sets  - 1 x daily - 7 x weekly - 3 sets - 10 reps - 5 seconds hold - Supine Double Knee to Chest  - 1 x daily - 7 x weekly - 2 sets - 15 reps (Discontinued 08/06/2021 secondary to increased symptoms)  - Seated Sciatic Tensioner  - 1 x daily - 7 x weekly - 2 sets - 15 reps (Discontinued 3/303/2023 due to increased symptoms.) - Supine Posterior Pelvic Tilt  - 1 x daily - 7 x weekly - 3 sets - 10 reps - 5 seconds hold -Reclined Hip extension isometrics, leg straight to promote glute max muscle strengthening                                     R 10x5 seconds for 3 sets                                     L 10x5 seconds for 3 sets - Reclined Single Knee to Chest Stretch  - 1 x daily - 7 x weekly - 3 sets - 10 reps - 5 seconds hold   Standing with  B UE assist, one foot on 2nd stair step with glute max squeeze to promote stretch of hip flexor muscles on stance LE  Stance LE: R 10x5 seconds for 3 sets    L 10x5 seconds for 3 sets  - Standing Bilateral Low Shoulder Row with Anchored Resistance  - 1 x daily - 7 x weekly - 3 sets - 10 reps - 5 seconds hold  - Standing Hip Abduction with Resistance at Ankles and Counter Support  - 1 x daily - 7 x weekly - 3 sets - 10 reps (Yellow latex free band)    PT Short Term Goals - 08/11/21 1152       PT SHORT TERM GOAL #1   Title Pt will be independent with her initial HEP to decrease pain, improve strength and ability to ambulate longer distances.    Baseline Pt has started her initial HEP (07/09/2021); Has been doing her HEP. Does not have questions (08/11/2021)    Time 3    Period Weeks    Status Achieved    Target Date 07/30/21              PT Long Term Goals - 12/31/21 1022       PT LONG TERM GOAL #1   Title Pt will have a decrease in low back pain to 4/10 or less at worst to promote ability to ambulate longer distances as well as perform standing tasks more comfortably.    Baseline 9/10 low back pain at most for the past 3 months. Pt also demonstrates B L5 radiating symptoms to knees (07/09/2021); 7-8/10 at most  for the past 7 days, usually around 5 pm and later (08/11/2021); 7-8/10 at most for the past 7 days, usually in the evening. (09/01/2021); 7.5/10 back pain at most  for the past 7 days (09/24/2021); 10/10 last Friday (other than that, 7-8/10) ( 11/03/2021); 7-8/10 low back pain at most for the past 7 days; has been in the care a lot.  (12/15/2021); 8/10 at worst for the past 7 days (12/31/2021)    Time 8    Period Weeks    Status On-going    Target Date 02/25/22      PT LONG TERM GOAL #2   Title Pt will improve B hip extension, abduction and ER strength by at least 1/2 MMT grade to promote ability to perform standing tasks with less back pain.    Baseline Hip extension 4+/5 R, 3+/5 L,  hip abduction 4/5 R,  4-/5 L, hip ER 4-/5 R, 3+/5 L (07/09/2021); hip extensoin 4+/5 R, 5/5 L, hip abduction 4+/5 R and L, hip ER 4/5 R, 4/5 L (08/11/2021); hip extension 5/5 R and L, hip abduction 4+/5 R and L, hip ER 4/5 R and L (09/01/2021); hip extension 5/5 R and L, hip abduction 4+/5 R and L, hip ER 4/5 R and L, pt states L knee is the side that needs a knee replacement (09/24/2021); hip extension 5/5 R, 4+/5 L, hip abduction 4+/5 R, and 4/5 L, hip ER 4/5 R, 4/5 L (11/03/2021)    Time 8    Period Weeks    Status Achieved    Target Date 10/29/21      PT LONG TERM GOAL #3   Title Pt will improve her lumbar FOTO score by at least 10 points as a demonstration of improved function.    Baseline Lumbar Spine FOTO 44 (07/09/2021); Lumbar FOTO 40. Pt might have anwered her back function limitation question differently secondary to pt stating more limited in function but that was before starting PT. Might have answered increased limitation in function during today's session since starting PT instead which may have decrease her score. (08/11/2021).  47 (09/01/2021); 42 (09/24/2021); 40 (12/15/2021); 47 (12/31/2021)    Time 8    Period Weeks    Status On-going    Target Date 12/31/21              Plan - 01/07/22 1017     Clinical Impression Statement Possible improvement in ability to stand and prepare supper based on subjective reports. Pt seems cautiously optimistic. Continued working on improving core strength secondary to weakness and low endurance of postural muscles. Decreased R and L LE tightness after manual therapy to decrease R lumbar paraspinal muscle tension. Pt will benefit from continued skilled physical therapy services to decrease pain, improve strength and function.    Personal Factors and Comorbidities Age;Comorbidity 3+;Fitness;Past/Current Experience;Time since onset of injury/illness/exacerbation    Comorbidities B knee pain, arthritis, chronic kidney disease, COPD, dyspnea, HTN     Examination-Activity Limitations Squat;Lift;Stairs;Locomotion Level;Stand;Reach Overhead;Carry    Stability/Clinical Decision Making Stable/Uncomplicated    Clinical Decision Making Low    Rehab Potential Fair    PT Frequency 2x / week    PT Duration 8 weeks    PT Treatment/Interventions Therapeutic activities;Therapeutic exercise;Balance training;Neuromuscular re-education;Patient/family education;Manual techniques;Dry needling;Aquatic Therapy;Electrical Stimulation;Iontophoresis '4mg'$ /ml Dexamethasone    PT Next Visit Plan Posture, trunk and hip strengthening, manual techniques, modalities PRN    PT Home Exercise Plan Medbridge Access Code [email or web address]    Consulted and Agree with  Plan of Care Patient                                           Joneen Boers PT, DPT  01/07/2022, 11:01 AM

## 2022-01-12 ENCOUNTER — Ambulatory Visit: Payer: Medicare Other | Attending: Sports Medicine

## 2022-01-12 DIAGNOSIS — M6281 Muscle weakness (generalized): Secondary | ICD-10-CM | POA: Diagnosis present

## 2022-01-12 DIAGNOSIS — M5416 Radiculopathy, lumbar region: Secondary | ICD-10-CM | POA: Diagnosis present

## 2022-01-12 DIAGNOSIS — M62838 Other muscle spasm: Secondary | ICD-10-CM | POA: Insufficient documentation

## 2022-01-12 DIAGNOSIS — M545 Low back pain, unspecified: Secondary | ICD-10-CM | POA: Insufficient documentation

## 2022-01-12 DIAGNOSIS — R262 Difficulty in walking, not elsewhere classified: Secondary | ICD-10-CM | POA: Insufficient documentation

## 2022-01-12 DIAGNOSIS — G8929 Other chronic pain: Secondary | ICD-10-CM | POA: Diagnosis present

## 2022-01-12 NOTE — Therapy (Signed)
OUTPATIENT PHYSICAL THERAPY TREATMENT NOTE      Patient Name: Julia Deleon MRN: [id number] DOB:06-28-1945, 76 y.o., female Today's Date: 01/12/2022  PCP: Idelle Crouch, MD REFERRING PROVIDER: Diamond Nickel, DO   PT End of Session - 01/12/22 1018     Visit Number 61    Number of Visits 55    Date for PT Re-Evaluation 02/25/22    Authorization Type Medicare Part A&B    PT Start Time 1018    PT Stop Time 1058    PT Time Calculation (min) 40 min    Activity Tolerance Patient tolerated treatment well    Behavior During Therapy WFL for tasks assessed/performed                                             Past Medical History:  Diagnosis Date   Arthritis    Chronic kidney disease    COPD (chronic obstructive pulmonary disease) ([place])    pt denies    Dyspnea    Family history of adverse reaction to anesthesia    father was slow to wake up   Fuch's endothelial dystrophy    GERD (gastroesophageal reflux disease)    Hyperlipidemia    Hypertension    Hypothyroidism    Pneumonia    hx of    PONV (postoperative nausea and vomiting)    slow to wake up and PONV   Past Surgical History:  Procedure Laterality Date   ABDOMINAL HYSTERECTOMY     APPENDECTOMY     BACK SURGERY  09/26/2018   L4-5 PLIF by Dr. Arnoldo Morale   CARDIAC CATHETERIZATION     CHOLECYSTECTOMY     COLONOSCOPY WITH PROPOFOL N/A 10/23/2020   Procedure: COLONOSCOPY WITH PROPOFOL;  Surgeon: Virgel Manifold, MD;  Location: [facility] ENDOSCOPY;  Service: Endoscopy;  Laterality: N/A;   ESOPHAGOGASTRODUODENOSCOPY N/A 10/23/2020   Procedure: ESOPHAGOGASTRODUODENOSCOPY (EGD);  Surgeon: Virgel Manifold, MD;  Location: [facility] ENDOSCOPY;  Service: Endoscopy;  Laterality: N/A;   EYE SURGERY     FOOT SURGERY Right    pin removed left   laser vein surgery     NASAL SINUS SURGERY     RIGHT/LEFT HEART CATH AND CORONARY ANGIOGRAPHY N/A 12/06/2017   Procedure: RIGHT/LEFT HEART CATH AND  CORONARY ANGIOGRAPHY;  Surgeon: Yolonda Kida, MD;  Location: [place] [facility];  Service: Cardiovascular;  Laterality: N/A;   ROBOTIC ASSISTED LAPAROSCOPIC SACROCOLPOPEXY Bilateral 07/30/2020   Procedure: XI ROBOTIC ASSISTED LAPAROSCOPIC SACROCOLPOPEXY AND SUPRACERVICAL HYSTERECTOMY WITH BILATERAL SALPINGO OOPHERECTOMY;  Surgeon: Ardis Hughs, MD;  Location: [facility];  Service: Urology;  Laterality: Bilateral;  REQUESTING 4 HRS   TONSILLECTOMY     Patient Active Problem List   Diagnosis Date Noted   Essential hypertension 01/23/2021   Cystocele with prolapse 07/30/2020   Postsurgical arthrodesis status 07/25/2020   Facet arthropathy 04/08/2020   Family history of breast cancer 11/16/2019   Body mass index (BMI) 25.0-25.9, adult 07/24/2019   Chronic UTI 12/06/2018   Status post lumbar surgery 12/06/2018   Spondylolisthesis, lumbar region 09/26/2018   Aortic atherosclerosis ([place]) 08/31/2018   Food sensitivity headache 03/29/2018   CAD (coronary artery disease) 02/23/2018   Advanced care planning/counseling discussion 03/28/2017   Varicose veins of both lower extremities with complications 12/87/8676   Arthritis 12/09/2016   Vaginal atrophy 09/10/2015   Vasomotor symptoms due to menopause  09/10/2015   Onychomycosis due to dermatophyte 04/07/2015   Bursitis of right shoulder 02/26/2015   Environmental and seasonal allergies 02/26/2015   Other allergic rhinitis 02/26/2015   Osteoporosis 11/26/2014   COPD (chronic obstructive pulmonary disease) ([place]) 11/26/2014   Fuchs' corneal dystrophy 11/26/2014   Benign hypertensive renal disease 11/26/2014   Hyperlipidemia 11/26/2014   Chronic kidney disease, stage III (moderate) ([place]) 11/26/2014   Hypothyroidism 11/26/2014   Endothelial corneal dystrophy 11/26/2014     REFERRING DIAG: Chronic L low back pain  THERAPY DIAG:  Chronic bilateral low back pain, unspecified whether sciatica present  Muscle weakness  (generalized)  Difficulty in walking, not elsewhere classified  Radiculopathy, lumbar region  PERTINENT HISTORY: Chronic low back pain. Had L4/5 fusion in her low back May 2020 which helped her sleep. Had PT following surgery which helped. Bowel and bladder are not perfect but improving. Had pelvic floor therapy which did not help. The bowel issues might have started after the back surgery. Feels like her urinary tract system quit due to the pain after back surgery. Was placed on a catheter for 6 months after the surgery. Denies saddle anesthesia. Pt also states having a kidney infection May 2022 and was diagnosed at the end of [place] 2022. Took all summer to get rid of the kidney infection which wiped her out and weakned her back.   PRECAUTIONS: No known precautions  SUBJECTIVE:  Its about a 3/10 LBP currently. Has tension R side. Might have slept on it wrong. R toe surgery is slow healing. Everything has been slow since her back surgery.        PAIN:  Are you having pain? 3/10 low back pain currently     TODAY'S TREATMENT:  Objective     Latex allergies     Therapeutic exercise 01/12/22     Seated on physioball:              Anterior/posterior pelvic tilts. L to B UE support throughout.  10x3 with 5 second holds  Seated marches alternating 10x3 with B UE support.    Standing PNF chops red band             R 10x5 seconds for 3 sets             L 10x5 seconds for 3 sets  Bent over hip extension              R 10x3 with 2 second holds             L 10x3 with 2 second holds   SLS with B UE assist and contralateral foot on first stair step to promote glute med muscle strengthening.   R 10x5 seconds for 3 sets  L 10x5 seconds for 3 sets    Improved exercise technique, movement at target joints, use of target muscles after mod verbal, visual, tactile cues.       Response to treatment Low back fatigue after session. Unsure if there is any pain. B LE tightness  reported.      Clinical impression Continued working on improving core and hip strength secondary to weakness and low endurance of postural muscles. Low back fatigue after session. Unsure if there is any pain. B LE tightness reported.  Pt will benefit from continued skilled physical therapy services to decrease pain, improve strength and function.     PATIENT EDUCATION: Education details: there-ex, HEP Person educated: Patient Education method: Explanation, Demonstration, Tactile cues, Verbal cues, and  Handouts Education comprehension: verbalized understanding and returned demonstration   HOME EXERCISE PROGRAM: Access Code: [email or web address] URL: [email or web address] Date: 08/04/2021 Prepared by: Joneen Boers  Exercises - Seated Transversus Abdominis Bracing  - 5 x daily - 7 x weekly - 3 sets - 10 reps - 5 seconds hold - Seated Trunk Rotation - Arms Crossed  - 1 x daily - 7 x weekly - 3 sets - 10 reps - 5 seconds  hold - Standing Gluteal Sets  - 1 x daily - 7 x weekly - 3 sets - 10 reps - 5 seconds hold - Supine Double Knee to Chest  - 1 x daily - 7 x weekly - 2 sets - 15 reps (Discontinued 08/06/2021 secondary to increased symptoms)  - Seated Sciatic Tensioner  - 1 x daily - 7 x weekly - 2 sets - 15 reps (Discontinued 3/303/2023 due to increased symptoms.) - Supine Posterior Pelvic Tilt  - 1 x daily - 7 x weekly - 3 sets - 10 reps - 5 seconds hold -Reclined Hip extension isometrics, leg straight to promote glute max muscle strengthening                                     R 10x5 seconds for 3 sets                                     L 10x5 seconds for 3 sets - Reclined Single Knee to Chest Stretch  - 1 x daily - 7 x weekly - 3 sets - 10 reps - 5 seconds hold   Standing with B UE assist, one foot on 2nd stair step with glute max squeeze to promote stretch of hip flexor muscles on stance LE  Stance LE: R 10x5 seconds for 3 sets    L 10x5 seconds for 3 sets  - Standing  Bilateral Low Shoulder Row with Anchored Resistance  - 1 x daily - 7 x weekly - 3 sets - 10 reps - 5 seconds hold  - Standing Hip Abduction with Resistance at Ankles and Counter Support  - 1 x daily - 7 x weekly - 3 sets - 10 reps (Yellow latex free band)    PT Short Term Goals - 08/11/21 1152       PT SHORT TERM GOAL #1   Title Pt will be independent with her initial HEP to decrease pain, improve strength and ability to ambulate longer distances.    Baseline Pt has started her initial HEP (07/09/2021); Has been doing her HEP. Does not have questions (08/11/2021)    Time 3    Period Weeks    Status Achieved    Target Date 07/30/21              PT Long Term Goals - 12/31/21 1022       PT LONG TERM GOAL #1   Title Pt will have a decrease in low back pain to 4/10 or less at worst to promote ability to ambulate longer distances as well as perform standing tasks more comfortably.    Baseline 9/10 low back pain at most for the past 3 months. Pt also demonstrates B L5 radiating symptoms to knees (07/09/2021); 7-8/10 at most for the past 7 days, usually around 5 pm and later (08/11/2021); 7-8/10 at most for the past  7 days, usually in the evening. (09/01/2021); 7.5/10 back pain at most  for the past 7 days (09/24/2021); 10/10 last Friday (other than that, 7-8/10) ( 11/03/2021); 7-8/10 low back pain at most for the past 7 days; has been in the care a lot.  (12/15/2021); 8/10 at worst for the past 7 days (12/31/2021)    Time 8    Period Weeks    Status On-going    Target Date 02/25/22      PT LONG TERM GOAL #2   Title Pt will improve B hip extension, abduction and ER strength by at least 1/2 MMT grade to promote ability to perform standing tasks with less back pain.    Baseline Hip extension 4+/5 R, 3+/5 L, hip abduction 4/5 R,  4-/5 L, hip ER 4-/5 R, 3+/5 L (07/09/2021); hip extensoin 4+/5 R, 5/5 L, hip abduction 4+/5 R and L, hip ER 4/5 R, 4/5 L (08/11/2021); hip extension 5/5 R and L, hip abduction 4+/5 R  and L, hip ER 4/5 R and L (09/01/2021); hip extension 5/5 R and L, hip abduction 4+/5 R and L, hip ER 4/5 R and L, pt states L knee is the side that needs a knee replacement (09/24/2021); hip extension 5/5 R, 4+/5 L, hip abduction 4+/5 R, and 4/5 L, hip ER 4/5 R, 4/5 L (11/03/2021)    Time 8    Period Weeks    Status Achieved    Target Date 10/29/21      PT LONG TERM GOAL #3   Title Pt will improve her lumbar FOTO score by at least 10 points as a demonstration of improved function.    Baseline Lumbar Spine FOTO 44 (07/09/2021); Lumbar FOTO 40. Pt might have anwered her back function limitation question differently secondary to pt stating more limited in function but that was before starting PT. Might have answered increased limitation in function during today's session since starting PT instead which may have decrease her score. (08/11/2021).  47 (09/01/2021); 42 (09/24/2021); 40 (12/15/2021); 47 (12/31/2021)    Time 8    Period Weeks    Status On-going    Target Date 12/31/21              Plan - 01/12/22 1018     Clinical Impression Statement Continued working on improving core and hip strength secondary to weakness and low endurance of postural muscles. Low back fatigue after session. Unsure if there is any pain. B LE tightness reported.  Pt will benefit from continued skilled physical therapy services to decrease pain, improve strength and function.    Personal Factors and Comorbidities Age;Comorbidity 3+;Fitness;Past/Current Experience;Time since onset of injury/illness/exacerbation    Comorbidities B knee pain, arthritis, chronic kidney disease, COPD, dyspnea, HTN    Examination-Activity Limitations Squat;Lift;Stairs;Locomotion Level;Stand;Reach Overhead;Carry    Stability/Clinical Decision Making Stable/Uncomplicated    Clinical Decision Making Low    Rehab Potential Fair    PT Frequency 2x / week    PT Duration 8 weeks    PT Treatment/Interventions Therapeutic activities;Therapeutic  exercise;Balance training;Neuromuscular re-education;Patient/family education;Manual techniques;Dry needling;Aquatic Therapy;Electrical Stimulation;Iontophoresis '4mg'$ /ml Dexamethasone    PT Next Visit Plan Posture, trunk and hip strengthening, manual techniques, modalities PRN    PT Home Exercise Plan Medbridge Access Code [email or web address]    Consulted and Agree with Plan of Care Patient  [address] PT, DPT  01/12/2022, 12:09 PM

## 2022-01-14 ENCOUNTER — Ambulatory Visit: Payer: Medicare Other

## 2022-01-14 DIAGNOSIS — M5416 Radiculopathy, lumbar region: Secondary | ICD-10-CM

## 2022-01-14 DIAGNOSIS — M545 Low back pain, unspecified: Secondary | ICD-10-CM | POA: Diagnosis not present

## 2022-01-14 DIAGNOSIS — R262 Difficulty in walking, not elsewhere classified: Secondary | ICD-10-CM

## 2022-01-14 NOTE — Therapy (Signed)
OUTPATIENT PHYSICAL THERAPY TREATMENT NOTE      Patient Name: Julia Deleon MRN: [id number] DOB:[birth year], 76 y.o., female Today's Date: 01/14/2022  PCP: Idelle Crouch, MD REFERRING PROVIDER: Diamond Nickel, DO   PT End of Session - 01/14/22 0930     Visit Number 2    Number of Visits 15    Date for PT Re-Evaluation 02/25/22    Authorization Type Medicare Part A&B    PT Start Time 0930    PT Stop Time 1013    PT Time Calculation (min) 43 min    Activity Tolerance Patient tolerated treatment well    Behavior During Therapy WFL for tasks assessed/performed                                              Past Medical History:  Diagnosis Date   Arthritis    Chronic kidney disease    COPD (chronic obstructive pulmonary disease) ([place])    pt denies    Dyspnea    Family history of adverse reaction to anesthesia    father was slow to wake up   Fuch's endothelial dystrophy    GERD (gastroesophageal reflux disease)    Hyperlipidemia    Hypertension    Hypothyroidism    Pneumonia    hx of    PONV (postoperative nausea and vomiting)    slow to wake up and PONV   Past Surgical History:  Procedure Laterality Date   ABDOMINAL HYSTERECTOMY     APPENDECTOMY     BACK SURGERY  09/26/2018   L4-5 PLIF by Dr. Arnoldo Morale   CARDIAC CATHETERIZATION     CHOLECYSTECTOMY     COLONOSCOPY WITH PROPOFOL N/A 10/23/2020   Procedure: COLONOSCOPY WITH PROPOFOL;  Surgeon: Virgel Manifold, MD;  Location: [facility] ENDOSCOPY;  Service: Endoscopy;  Laterality: N/A;   ESOPHAGOGASTRODUODENOSCOPY N/A 10/23/2020   Procedure: ESOPHAGOGASTRODUODENOSCOPY (EGD);  Surgeon: Virgel Manifold, MD;  Location: [facility] ENDOSCOPY;  Service: Endoscopy;  Laterality: N/A;   EYE SURGERY     FOOT SURGERY Right    pin removed left   laser vein surgery     NASAL SINUS SURGERY     RIGHT/LEFT HEART CATH AND CORONARY ANGIOGRAPHY N/A 12/06/2017   Procedure: RIGHT/LEFT HEART CATH AND  CORONARY ANGIOGRAPHY;  Surgeon: Yolonda Kida, MD;  Location: [place] [facility];  Service: Cardiovascular;  Laterality: N/A;   ROBOTIC ASSISTED LAPAROSCOPIC SACROCOLPOPEXY Bilateral 07/30/2020   Procedure: XI ROBOTIC ASSISTED LAPAROSCOPIC SACROCOLPOPEXY AND SUPRACERVICAL HYSTERECTOMY WITH BILATERAL SALPINGO OOPHERECTOMY;  Surgeon: Ardis Hughs, MD;  Location: [facility];  Service: Urology;  Laterality: Bilateral;  REQUESTING 4 HRS   TONSILLECTOMY     Patient Active Problem List   Diagnosis Date Noted   Essential hypertension 01/23/2021   Cystocele with prolapse 07/30/2020   Postsurgical arthrodesis status 07/25/2020   Facet arthropathy 04/08/2020   Family history of breast cancer 11/16/2019   Body mass index (BMI) 25.0-25.9, adult 07/24/2019   Chronic UTI 12/06/2018   Status post lumbar surgery 12/06/2018   Spondylolisthesis, lumbar region 09/26/2018   Aortic atherosclerosis ([place]) 08/31/2018   Food sensitivity headache 03/29/2018   CAD (coronary artery disease) 02/23/2018   Advanced care planning/counseling discussion 03/28/2017   Varicose veins of both lower extremities with complications 31/54/0086   Arthritis 12/09/2016   Vaginal atrophy 09/10/2015   Vasomotor symptoms due to  menopause 09/10/2015   Onychomycosis due to dermatophyte 04/07/2015   Bursitis of right shoulder 02/26/2015   Environmental and seasonal allergies 02/26/2015   Other allergic rhinitis 02/26/2015   Osteoporosis 11/26/2014   COPD (chronic obstructive pulmonary disease) ([place]) 11/26/2014   Fuchs' corneal dystrophy 11/26/2014   Benign hypertensive renal disease 11/26/2014   Hyperlipidemia 11/26/2014   Chronic kidney disease, stage III (moderate) ([place]) 11/26/2014   Hypothyroidism 11/26/2014   Endothelial corneal dystrophy 11/26/2014     REFERRING DIAG: Chronic L low back pain  THERAPY DIAG:  Chronic bilateral low back pain, unspecified whether sciatica present  Muscle weakness  (generalized)  Difficulty in walking, not elsewhere classified  Radiculopathy, lumbar region  PERTINENT HISTORY: Chronic low back pain. Had L4/5 fusion in her low back May 2020 which helped her sleep. Had PT following surgery which helped. Bowel and bladder are not perfect but improving. Had pelvic floor therapy which did not help. The bowel issues might have started after the back surgery. Feels like her urinary tract system quit due to the pain after back surgery. Was placed on a catheter for 6 months after the surgery. Denies saddle anesthesia. Pt also states having a kidney infection May 2022 and was diagnosed at the end of [place] 2022. Took all summer to get rid of the kidney infection which wiped her out and weakned her back.   PRECAUTIONS: No known precautions  SUBJECTIVE:  Back is about a 3/10 currently. Never wakes up at a 0/10. 7-8/10 back pain at most for the past 7 days.      PAIN:  Are you having pain? 3/10 low back pain currently     TODAY'S TREATMENT:  Objective     Latex allergies     Therapeutic exercise 01/14/22    Forward step up onto 1st regular step with B UE assist   R 10x3  L 10x3   Standing trunk side bend   R 10x3  L 10x3  Standing low rows red band 15x5 seconds for 3 sets    Standing R shoulder adduction red band 10x5 seconds for 2 sets  Standing  R shoulder extension red band 10x5 seconds for 2 sets  Seated manually resisted trunk R lateral shift isometrics in neutral to improve posture 10x5 seconds for 3 sets  Seated hip extension isometrics   R 10x5 seconds for 2 sets  L 10x5 seconds for 2 sets    Improved exercise technique, movement at target joints, use of target muscles after mod verbal, visual, tactile cues.       Response to treatment  Fair tolerance to today's session. Back fatigue reported after session.      Clinical impression  Worked on improving trunk posture, trunk and glute strength predominantly in standing to  improve ability to make supper at home with less back pain. Fair tolerance to today's session with reports of increased fatigue in her back, 4/10 after session with B L5 dermatome tightness. Tightness usually improves with manual therapy to decrease lumbar paraspinal muscle tension, so pt was recommended to use a heating pad at home to relax her lumbar muscles. Pt verbalized understanding. Pt will benefit from continued skilled physical therapy services to decrease pain, improve strength and function.     PATIENT EDUCATION: Education details: there-ex, HEP Person educated: Patient Education method: Explanation, Demonstration, Tactile cues, Verbal cues, and Handouts Education comprehension: verbalized understanding and returned demonstration   HOME EXERCISE PROGRAM: Access Code: [email or web address] URL: [email or web address] Date: 08/04/2021 Prepared by: Kittie Plater  Tressy Kunzman  Exercises - Seated Transversus Abdominis Bracing  - 5 x daily - 7 x weekly - 3 sets - 10 reps - 5 seconds hold - Seated Trunk Rotation - Arms Crossed  - 1 x daily - 7 x weekly - 3 sets - 10 reps - 5 seconds  hold - Standing Gluteal Sets  - 1 x daily - 7 x weekly - 3 sets - 10 reps - 5 seconds hold - Supine Double Knee to Chest  - 1 x daily - 7 x weekly - 2 sets - 15 reps (Discontinued 08/06/2021 secondary to increased symptoms)  - Seated Sciatic Tensioner  - 1 x daily - 7 x weekly - 2 sets - 15 reps (Discontinued 3/303/2023 due to increased symptoms.) - Supine Posterior Pelvic Tilt  - 1 x daily - 7 x weekly - 3 sets - 10 reps - 5 seconds hold -Reclined Hip extension isometrics, leg straight to promote glute max muscle strengthening                                     R 10x5 seconds for 3 sets                                     L 10x5 seconds for 3 sets - Reclined Single Knee to Chest Stretch  - 1 x daily - 7 x weekly - 3 sets - 10 reps - 5 seconds hold   Standing with B UE assist, one foot on 2nd stair step with glute  max squeeze to promote stretch of hip flexor muscles on stance LE  Stance LE: R 10x5 seconds for 3 sets    L 10x5 seconds for 3 sets  - Standing Bilateral Low Shoulder Row with Anchored Resistance  - 1 x daily - 7 x weekly - 3 sets - 10 reps - 5 seconds hold  - Standing Hip Abduction with Resistance at Ankles and Counter Support  - 1 x daily - 7 x weekly - 3 sets - 10 reps (Yellow latex free band)    PT Short Term Goals - 08/11/21 1152       PT SHORT TERM GOAL #1   Title Pt will be independent with her initial HEP to decrease pain, improve strength and ability to ambulate longer distances.    Baseline Pt has started her initial HEP (07/09/2021); Has been doing her HEP. Does not have questions (08/11/2021)    Time 3    Period Weeks    Status Achieved    Target Date 07/30/21              PT Long Term Goals - 12/31/21 1022       PT LONG TERM GOAL #1   Title Pt will have a decrease in low back pain to 4/10 or less at worst to promote ability to ambulate longer distances as well as perform standing tasks more comfortably.    Baseline 9/10 low back pain at most for the past 3 months. Pt also demonstrates B L5 radiating symptoms to knees (07/09/2021); 7-8/10 at most for the past 7 days, usually around 5 pm and later (08/11/2021); 7-8/10 at most for the past 7 days, usually in the evening. (09/01/2021); 7.5/10 back pain at most  for the past 7 days (09/24/2021); 10/10 last Friday (other  than that, 7-8/10) ( 11/03/2021); 7-8/10 low back pain at most for the past 7 days; has been in the care a lot.  (12/15/2021); 8/10 at worst for the past 7 days (12/31/2021)    Time 8    Period Weeks    Status On-going    Target Date 02/25/22      PT LONG TERM GOAL #2   Title Pt will improve B hip extension, abduction and ER strength by at least 1/2 MMT grade to promote ability to perform standing tasks with less back pain.    Baseline Hip extension 4+/5 R, 3+/5 L, hip abduction 4/5 R,  4-/5 L, hip ER 4-/5 R, 3+/5 L  (07/09/2021); hip extensoin 4+/5 R, 5/5 L, hip abduction 4+/5 R and L, hip ER 4/5 R, 4/5 L (08/11/2021); hip extension 5/5 R and L, hip abduction 4+/5 R and L, hip ER 4/5 R and L (09/01/2021); hip extension 5/5 R and L, hip abduction 4+/5 R and L, hip ER 4/5 R and L, pt states L knee is the side that needs a knee replacement (09/24/2021); hip extension 5/5 R, 4+/5 L, hip abduction 4+/5 R, and 4/5 L, hip ER 4/5 R, 4/5 L (11/03/2021)    Time 8    Period Weeks    Status Achieved    Target Date 10/29/21      PT LONG TERM GOAL #3   Title Pt will improve her lumbar FOTO score by at least 10 points as a demonstration of improved function.    Baseline Lumbar Spine FOTO 44 (07/09/2021); Lumbar FOTO 40. Pt might have anwered her back function limitation question differently secondary to pt stating more limited in function but that was before starting PT. Might have answered increased limitation in function during today's session since starting PT instead which may have decrease her score. (08/11/2021).  47 (09/01/2021); 42 (09/24/2021); 40 (12/15/2021); 47 (12/31/2021)    Time 8    Period Weeks    Status On-going    Target Date 12/31/21              Plan - 01/14/22 0929     Clinical Impression Statement Worked on improving trunk posture, trunk and glute strength predominantly in standing to improve ability to make supper at home with less back pain. Fair tolerance to today's session with reports of increased fatigue in her back, 4/10 after session with B L5 dermatome tightness. Tightness usually improves with manual therapy to decrease lumbar paraspinal muscle tension, so pt was recommended to use a heating pad at home to relax her lumbar muscles. Pt verbalized understanding. Pt will benefit from continued skilled physical therapy services to decrease pain, improve strength and function.    Personal Factors and Comorbidities Age;Comorbidity 3+;Fitness;Past/Current Experience;Time since onset of  injury/illness/exacerbation    Comorbidities B knee pain, arthritis, chronic kidney disease, COPD, dyspnea, HTN    Examination-Activity Limitations Squat;Lift;Stairs;Locomotion Level;Stand;Reach Overhead;Carry    Stability/Clinical Decision Making Stable/Uncomplicated    Clinical Decision Making Low    Rehab Potential Fair    PT Frequency 2x / week    PT Duration 8 weeks    PT Treatment/Interventions Therapeutic activities;Therapeutic exercise;Balance training;Neuromuscular re-education;Patient/family education;Manual techniques;Dry needling;Aquatic Therapy;Electrical Stimulation;Iontophoresis '4mg'$ /ml Dexamethasone    PT Next Visit Plan Posture, trunk and hip strengthening, manual techniques, modalities PRN    PT Home Exercise Plan Medbridge Access Code [email or web address]    Consulted and Agree with Plan of Care Patient  [facility] PT, DPT  01/14/2022, 10:20 AM

## 2022-01-19 ENCOUNTER — Ambulatory Visit: Payer: Medicare Other

## 2022-01-19 DIAGNOSIS — M545 Low back pain, unspecified: Secondary | ICD-10-CM | POA: Diagnosis not present

## 2022-01-19 DIAGNOSIS — G8929 Other chronic pain: Secondary | ICD-10-CM

## 2022-01-19 DIAGNOSIS — R262 Difficulty in walking, not elsewhere classified: Secondary | ICD-10-CM

## 2022-01-19 DIAGNOSIS — M5416 Radiculopathy, lumbar region: Secondary | ICD-10-CM

## 2022-01-19 NOTE — Therapy (Signed)
OUTPATIENT PHYSICAL THERAPY TREATMENT NOTE And Progress Report (12/15/2021 - 01/19/2022)      Patient Name: Julia Deleon MRN: [id number] DOB:[birth year], 76 y.o., female Today's Date: 01/19/2022  PCP: Idelle Crouch, MD REFERRING PROVIDER: Diamond Nickel, DO   PT End of Session - 01/19/22 0934     Visit Number 3    Number of Visits 31    Date for PT Re-Evaluation 02/25/22    Authorization Type Medicare Part A&B    PT Start Time 0934    PT Stop Time 1024    PT Time Calculation (min) 50 min    Activity Tolerance Patient tolerated treatment well    Behavior During Therapy WFL for tasks assessed/performed                                               Past Medical History:  Diagnosis Date   Arthritis    Chronic kidney disease    COPD (chronic obstructive pulmonary disease) ([place])    pt denies    Dyspnea    Family history of adverse reaction to anesthesia    father was slow to wake up   Fuch's endothelial dystrophy    GERD (gastroesophageal reflux disease)    Hyperlipidemia    Hypertension    Hypothyroidism    Pneumonia    hx of    PONV (postoperative nausea and vomiting)    slow to wake up and PONV   Past Surgical History:  Procedure Laterality Date   ABDOMINAL HYSTERECTOMY     APPENDECTOMY     BACK SURGERY  09/26/2018   L4-5 PLIF by Dr. Arnoldo Morale   CARDIAC CATHETERIZATION     CHOLECYSTECTOMY     COLONOSCOPY WITH PROPOFOL N/A 10/23/2020   Procedure: COLONOSCOPY WITH PROPOFOL;  Surgeon: Virgel Manifold, MD;  Location: [facility] ENDOSCOPY;  Service: Endoscopy;  Laterality: N/A;   ESOPHAGOGASTRODUODENOSCOPY N/A 10/23/2020   Procedure: ESOPHAGOGASTRODUODENOSCOPY (EGD);  Surgeon: Virgel Manifold, MD;  Location: [facility] ENDOSCOPY;  Service: Endoscopy;  Laterality: N/A;   EYE SURGERY     FOOT SURGERY Right    pin removed left   laser vein surgery     NASAL SINUS SURGERY     RIGHT/LEFT HEART CATH AND CORONARY ANGIOGRAPHY N/A  12/06/2017   Procedure: RIGHT/LEFT HEART CATH AND CORONARY ANGIOGRAPHY;  Surgeon: Yolonda Kida, MD;  Location: [facility];  Service: Cardiovascular;  Laterality: N/A;   ROBOTIC ASSISTED LAPAROSCOPIC SACROCOLPOPEXY Bilateral 07/30/2020   Procedure: XI ROBOTIC ASSISTED LAPAROSCOPIC SACROCOLPOPEXY AND SUPRACERVICAL HYSTERECTOMY WITH BILATERAL SALPINGO OOPHERECTOMY;  Surgeon: Ardis Hughs, MD;  Location: [facility];  Service: Urology;  Laterality: Bilateral;  REQUESTING 4 HRS   TONSILLECTOMY     Patient Active Problem List   Diagnosis Date Noted   Essential hypertension 01/23/2021   Cystocele with prolapse 07/30/2020   Postsurgical arthrodesis status 07/25/2020   Facet arthropathy 04/08/2020   Family history of breast cancer 11/16/2019   Body mass index (BMI) 25.0-25.9, adult 07/24/2019   Chronic UTI 12/06/2018   Status post lumbar surgery 12/06/2018   Spondylolisthesis, lumbar region 09/26/2018   Aortic atherosclerosis ([place]) 08/31/2018   Food sensitivity headache 03/29/2018   CAD (coronary artery disease) 02/23/2018   Advanced care planning/counseling discussion 03/28/2017   Varicose veins of both lower extremities with complications 19/50/9326   Arthritis 12/09/2016   Vaginal atrophy  09/10/2015   Vasomotor symptoms due to menopause 09/10/2015   Onychomycosis due to dermatophyte 04/07/2015   Bursitis of right shoulder 02/26/2015   Environmental and seasonal allergies 02/26/2015   Other allergic rhinitis 02/26/2015   Osteoporosis 11/26/2014   COPD (chronic obstructive pulmonary disease) ([place]) 11/26/2014   Fuchs' corneal dystrophy 11/26/2014   Benign hypertensive renal disease 11/26/2014   Hyperlipidemia 11/26/2014   Chronic kidney disease, stage III (moderate) ([employer]) 11/26/2014   Hypothyroidism 11/26/2014   Endothelial corneal dystrophy 11/26/2014     REFERRING DIAG: Chronic L low back pain  THERAPY DIAG:  Chronic bilateral low back pain, unspecified whether  sciatica present  Muscle weakness (generalized)  Difficulty in walking, not elsewhere classified  Radiculopathy, lumbar region  PERTINENT HISTORY: Chronic low back pain. Had L4/5 fusion in her low back May 2020 which helped her sleep. Had PT following surgery which helped. Bowel and bladder are not perfect but improving. Had pelvic floor therapy which did not help. The bowel issues might have started after the back surgery. Feels like her urinary tract system quit due to the pain after back surgery. Was placed on a catheter for 6 months after the surgery. Denies saddle anesthesia. Pt also states having a kidney infection May 2022 and was diagnosed at the end of Jeyli 2022. Took all summer to get rid of the kidney infection which wiped her out and weakned her back.   PRECAUTIONS: No known precautions  SUBJECTIVE:  Back stays about a 3/10 currently. Using her back brace is less. Also used her back brace very little at her last out of town trip. Still soaking her foot. Has not been able to increase her walking. Finds herself bending over and putting her elbows on the kitchen counter when making dinner. Does not do that early in the morning and at lunch. Back also feels tired at the end of the day.      PAIN:  Are you having pain? 3/10 low back pain currently     TODAY'S TREATMENT:  Objective     Latex allergies     Therapeutic exercise 01/19/22   Seated manually resisted trunk R lateral shift isometrics in neutral to improve posture 10x5 seconds for 3 sets  Seated B scapular retraction with chin tucks 7x5 seconds  R LE symptoms.   Seated trunk rotation 10x2 each direction  Seated manually resisted R upper trunk rotation isometrics in neutral to improve posture and trunk strength 10x5 seconds for 3 sets  SLS with contralateral LE on upside down Bosu, emphasis on level pelvis, B UE assist   R 30 seconds x 3  L 30 seconds x 3  Reclined  posterior pelvic tilt 10x10  seconds  Then with marches 10x3   Emphasis on pelvic control  Reverse crunches 3x    Improved exercise technique, movement at target joints, use of target muscles after mod verbal, visual, tactile cues.       Response to treatment  Fair tolerance to today's session. Back fatigue reported after session.      Clinical impression  Pt demonstrates similar back pain at worst compared to previous progress reports. Pt however reports using her back brace less which suggests slight improvement. Continued working on improving posture, trunk and glute strength secondary to weakness and limited endurance. Pt demonstrates difficulty with trunk muscle endurance. Fair tolerance to today's session with reports of fatigue. Pt will benefit from continued skilled physical therapy services to decrease pain, improve strength and function.  PATIENT EDUCATION: Education details: there-ex, HEP Person educated: Patient Education method: Explanation, Demonstration, Tactile cues, Verbal cues, and Handouts Education comprehension: verbalized understanding and returned demonstration   HOME EXERCISE PROGRAM: Access Code: [email or web address] URL: [email or web address] Date: 08/04/2021 Prepared by: Joneen Boers  Exercises - Seated Transversus Abdominis Bracing  - 5 x daily - 7 x weekly - 3 sets - 10 reps - 5 seconds hold - Seated Trunk Rotation - Arms Crossed  - 1 x daily - 7 x weekly - 3 sets - 10 reps - 5 seconds  hold - Standing Gluteal Sets  - 1 x daily - 7 x weekly - 3 sets - 10 reps - 5 seconds hold - Supine Double Knee to Chest  - 1 x daily - 7 x weekly - 2 sets - 15 reps (Discontinued 08/06/2021 secondary to increased symptoms)  - Seated Sciatic Tensioner  - 1 x daily - 7 x weekly - 2 sets - 15 reps (Discontinued 3/303/2023 due to increased symptoms.) - Supine Posterior Pelvic Tilt  - 1 x daily - 7 x weekly - 3 sets - 10 reps - 5 seconds hold -Reclined Hip extension isometrics, leg  straight to promote glute max muscle strengthening                                     R 10x5 seconds for 3 sets                                     L 10x5 seconds for 3 sets - Reclined Single Knee to Chest Stretch  - 1 x daily - 7 x weekly - 3 sets - 10 reps - 5 seconds hold   Standing with B UE assist, one foot on 2nd stair step with glute max squeeze to promote stretch of hip flexor muscles on stance LE  Stance LE: R 10x5 seconds for 3 sets    L 10x5 seconds for 3 sets  - Standing Bilateral Low Shoulder Row with Anchored Resistance  - 1 x daily - 7 x weekly - 3 sets - 10 reps - 5 seconds hold  - Standing Hip Abduction with Resistance at Ankles and Counter Support  - 1 x daily - 7 x weekly - 3 sets - 10 reps (Yellow latex free band) - Supine March with Posterior Pelvic Tilt  - 1 x daily - 7 x weekly - 3 sets - 10 reps       PT Short Term Goals - 08/11/21 1152       PT SHORT TERM GOAL #1   Title Pt will be independent with her initial HEP to decrease pain, improve strength and ability to ambulate longer distances.    Baseline Pt has started her initial HEP (07/09/2021); Has been doing her HEP. Does not have questions (08/11/2021)    Time 3    Period Weeks    Status Achieved    Target Date 07/30/21              PT Long Term Goals - 01/19/22 0938       PT LONG TERM GOAL #1   Title Pt will have a decrease in low back pain to 4/10 or less at worst to promote ability to ambulate longer distances as well as perform standing tasks more comfortably.  Baseline 9/10 low back pain at most for the past 3 months. Pt also demonstrates B L5 radiating symptoms to knees (07/09/2021); 7-8/10 at most for the past 7 days, usually around 5 pm and later (08/11/2021); 7-8/10 at most for the past 7 days, usually in the evening. (09/01/2021); 7.5/10 back pain at most  for the past 7 days (09/24/2021); 10/10 last Friday (other than that, 7-8/10) ( 11/03/2021); 7-8/10 low back pain at most for the past 7 days;  has been in the care a lot.  (12/15/2021); 8/10 at worst for the past 7 days (12/31/2021); 7-8/10 at worst for the past 7 days. Pt however states that she is using her back brace less. (01/19/2022)    Time 8    Period Weeks    Status On-going    Target Date 02/25/22      PT LONG TERM GOAL #2   Title Pt will improve B hip extension, abduction and ER strength by at least 1/2 MMT grade to promote ability to perform standing tasks with less back pain.    Baseline Hip extension 4+/5 R, 3+/5 L, hip abduction 4/5 R,  4-/5 L, hip ER 4-/5 R, 3+/5 L (07/09/2021); hip extensoin 4+/5 R, 5/5 L, hip abduction 4+/5 R and L, hip ER 4/5 R, 4/5 L (08/11/2021); hip extension 5/5 R and L, hip abduction 4+/5 R and L, hip ER 4/5 R and L (09/01/2021); hip extension 5/5 R and L, hip abduction 4+/5 R and L, hip ER 4/5 R and L, pt states L knee is the side that needs a knee replacement (09/24/2021); hip extension 5/5 R, 4+/5 L, hip abduction 4+/5 R, and 4/5 L, hip ER 4/5 R, 4/5 L (11/03/2021)    Time 8    Period Weeks    Status Achieved    Target Date 10/29/21      PT LONG TERM GOAL #3   Title Pt will improve her lumbar FOTO score by at least 10 points as a demonstration of improved function.    Baseline Lumbar Spine FOTO 44 (07/09/2021); Lumbar FOTO 40. Pt might have anwered her back function limitation question differently secondary to pt stating more limited in function but that was before starting PT. Might have answered increased limitation in function during today's session since starting PT instead which may have decrease her score. (08/11/2021).  47 (09/01/2021); 42 (09/24/2021); 40 (12/15/2021); 47 (12/31/2021)    Time 8    Period Weeks    Status On-going    Target Date 12/31/21              Plan - 01/19/22 0932     Clinical Impression Statement Pt demonstrates similar back pain at worst compared to previous progress reports. Pt however reports using her back brace less which suggests slight improvement. Continued working  on improving posture, trunk and glute strength secondary to weakness and limited endurance. Pt demonstrates difficulty with trunk muscle endurance. Fair tolerance to today's session with reports of fatigue. Pt will benefit from continued skilled physical therapy services to decrease pain, improve strength and function.    Personal Factors and Comorbidities Age;Comorbidity 3+;Fitness;Past/Current Experience;Time since onset of injury/illness/exacerbation    Comorbidities B knee pain, arthritis, chronic kidney disease, COPD, dyspnea, HTN    Examination-Activity Limitations Squat;Lift;Stairs;Locomotion Level;Stand;Reach Overhead;Carry    Stability/Clinical Decision Making Stable/Uncomplicated    Clinical Decision Making Low    Rehab Potential Fair    PT Frequency 2x / week    PT Duration 8  weeks    PT Treatment/Interventions Therapeutic activities;Therapeutic exercise;Balance training;Neuromuscular re-education;Patient/family education;Manual techniques;Dry needling;Aquatic Therapy;Electrical Stimulation;Iontophoresis '4mg'$ /ml Dexamethasone    PT Next Visit Plan Posture, trunk and hip strengthening, manual techniques, modalities PRN    PT Home Exercise Plan Medbridge Access Code [email or web address]    Consulted and Agree with Plan of Care Patient                                             Thank you for your referral.    Joneen Boers PT, DPT  01/19/2022, 10:53 AM

## 2022-01-21 ENCOUNTER — Ambulatory Visit: Payer: Medicare Other

## 2022-01-21 DIAGNOSIS — G8929 Other chronic pain: Secondary | ICD-10-CM

## 2022-01-21 DIAGNOSIS — R262 Difficulty in walking, not elsewhere classified: Secondary | ICD-10-CM

## 2022-01-21 DIAGNOSIS — M545 Low back pain, unspecified: Secondary | ICD-10-CM | POA: Diagnosis not present

## 2022-01-21 DIAGNOSIS — M5416 Radiculopathy, lumbar region: Secondary | ICD-10-CM

## 2022-01-21 NOTE — Therapy (Signed)
OUTPATIENT PHYSICAL THERAPY TREATMENT NOTE        Patient Name: Julia Deleon MRN: [id number] DOB:[birth year], 76 y.o., female Today's Date: 01/21/2022  PCP: Idelle Crouch, MD REFERRING PROVIDER: Diamond Nickel, DO   PT End of Session - 01/21/22 0930     Visit Number [id number]    Number of Visits 55    Date for PT Re-Evaluation 02/25/22    Authorization Type Medicare Part A&B    PT Start Time 0930    PT Stop Time 1016    PT Time Calculation (min) 46 min    Activity Tolerance Patient tolerated treatment well    Behavior During Therapy WFL for tasks assessed/performed                                                Past Medical History:  Diagnosis Date   Arthritis    Chronic kidney disease    COPD (chronic obstructive pulmonary disease) ([place])    pt denies    Dyspnea    Family history of adverse reaction to anesthesia    father was slow to wake up   Fuch's endothelial dystrophy    GERD (gastroesophageal reflux disease)    Hyperlipidemia    Hypertension    Hypothyroidism    Pneumonia    hx of    PONV (postoperative nausea and vomiting)    slow to wake up and PONV   Past Surgical History:  Procedure Laterality Date   ABDOMINAL HYSTERECTOMY     APPENDECTOMY     BACK SURGERY  09/26/2018   L4-5 PLIF by Dr. Arnoldo Morale   CARDIAC CATHETERIZATION     CHOLECYSTECTOMY     COLONOSCOPY WITH PROPOFOL N/A 10/23/2020   Procedure: COLONOSCOPY WITH PROPOFOL;  Surgeon: Virgel Manifold, MD;  Location: [facility] ENDOSCOPY;  Service: Endoscopy;  Laterality: N/A;   ESOPHAGOGASTRODUODENOSCOPY N/A 10/23/2020   Procedure: ESOPHAGOGASTRODUODENOSCOPY (EGD);  Surgeon: Virgel Manifold, MD;  Location: [facility] ENDOSCOPY;  Service: Endoscopy;  Laterality: N/A;   EYE SURGERY     FOOT SURGERY Right    pin removed left   laser vein surgery     NASAL SINUS SURGERY     RIGHT/LEFT HEART CATH AND CORONARY ANGIOGRAPHY N/A 12/06/2017   Procedure: RIGHT/LEFT HEART  CATH AND CORONARY ANGIOGRAPHY;  Surgeon: Yolonda Kida, MD;  Location: [facility];  Service: Cardiovascular;  Laterality: N/A;   ROBOTIC ASSISTED LAPAROSCOPIC SACROCOLPOPEXY Bilateral 07/30/2020   Procedure: XI ROBOTIC ASSISTED LAPAROSCOPIC SACROCOLPOPEXY AND SUPRACERVICAL HYSTERECTOMY WITH BILATERAL SALPINGO OOPHERECTOMY;  Surgeon: Ardis Hughs, MD;  Location: [facility];  Service: Urology;  Laterality: Bilateral;  REQUESTING 4 HRS   TONSILLECTOMY     Patient Active Problem List   Diagnosis Date Noted   Essential hypertension 01/23/2021   Cystocele with prolapse 07/30/2020   Postsurgical arthrodesis status 07/25/2020   Facet arthropathy 04/08/2020   Family history of breast cancer 11/16/2019   Body mass index (BMI) 25.0-25.9, adult 07/24/2019   Chronic UTI 12/06/2018   Status post lumbar surgery 12/06/2018   Spondylolisthesis, lumbar region 09/26/2018   Aortic atherosclerosis ([place]) 08/31/2018   Food sensitivity headache 03/29/2018   CAD (coronary artery disease) 02/23/2018   Advanced care planning/counseling discussion 03/28/2017   Varicose veins of both lower extremities with complications 29/51/8841   Arthritis 12/09/2016   Vaginal atrophy 09/10/2015  Vasomotor symptoms due to menopause 09/10/2015   Onychomycosis due to dermatophyte 04/07/2015   Bursitis of right shoulder 02/26/2015   Environmental and seasonal allergies 02/26/2015   Other allergic rhinitis 02/26/2015   Osteoporosis 11/26/2014   COPD (chronic obstructive pulmonary disease) ([place]) 11/26/2014   Fuchs' corneal dystrophy 11/26/2014   Benign hypertensive renal disease 11/26/2014   Hyperlipidemia 11/26/2014   Chronic kidney disease, stage III (moderate) ([place]) 11/26/2014   Hypothyroidism 11/26/2014   Endothelial corneal dystrophy 11/26/2014     REFERRING DIAG: Chronic L low back pain  THERAPY DIAG:  Chronic bilateral low back pain, unspecified whether sciatica present  Muscle weakness  (generalized)  Difficulty in walking, not elsewhere classified  Radiculopathy, lumbar region  PERTINENT HISTORY: Chronic low back pain. Had L4/5 fusion in her low back May 2020 which helped her sleep. Had PT following surgery which helped. Bowel and bladder are not perfect but improving. Had pelvic floor therapy which did not help. The bowel issues might have started after the back surgery. Feels like her urinary tract system quit due to the pain after back surgery. Was placed on a catheter for 6 months after the surgery. Denies saddle anesthesia. Pt also states having a kidney infection May 2022 and was diagnosed at the end of Inda 2022. Took all summer to get rid of the kidney infection which wiped her out and weakned her back.   PRECAUTIONS: No known precautions  SUBJECTIVE:  Back is about a 3/10 every morning.      PAIN:  Are you having pain? 3/10 low back pain currently     TODAY'S TREATMENT:  Objective     Latex allergies     Therapeutic exercise 01/21/22   Seated manually resisted trunk R lateral shift isometrics in neutral to improve posture 10x5 seconds for 3 sets  Seated manually resisted R upper trunk rotation isometrics in neutral to improve posture and trunk strength 10x5 seconds for 3 sets  Seated trunk flexion 10x10 seconds   Then 20 seconds x 3  Standing B low rows red band 10x2 with 10 second holds (upgrade hold time)   Bent over hip extension              R 10x3 with 2 second holds             L 10x3 with 2 second holds   SLS with contralateral LE on upside down Bosu, emphasis on level pelvis, B UE assist   R 30 seconds x 3  L 30 seconds x 3   Improved exercise technique, movement at target joints, use of target muscles after mod verbal, visual, tactile cues.        Response to treatment  Fair tolerance to today's session. Back fatigue reported after session.      Clinical impression Continued working on improving posture, trunk and  glute strength secondary to weakness and limited endurance. Pt demonstrates difficulty with trunk muscle endurance. Fair tolerance to today's session with reports of fatigue. Pt will benefit from continued skilled physical therapy services to decrease pain, improve strength and function.     PATIENT EDUCATION: Education details: there-ex, HEP Person educated: Patient Education method: Explanation, Demonstration, Tactile cues, Verbal cues, and Handouts Education comprehension: verbalized understanding and returned demonstration   HOME EXERCISE PROGRAM: Access Code: [email or web address] URL: [email or web address] Date: 08/04/2021 Prepared by: Joneen Boers  Exercises - Seated Transversus Abdominis Bracing  - 5 x daily - 7 x weekly - 3 sets - 10 reps - 5  seconds hold - Seated Trunk Rotation - Arms Crossed  - 1 x daily - 7 x weekly - 3 sets - 10 reps - 5 seconds  hold - Standing Gluteal Sets  - 1 x daily - 7 x weekly - 3 sets - 10 reps - 5 seconds hold - Supine Double Knee to Chest  - 1 x daily - 7 x weekly - 2 sets - 15 reps (Discontinued 08/06/2021 secondary to increased symptoms)  - Seated Sciatic Tensioner  - 1 x daily - 7 x weekly - 2 sets - 15 reps (Discontinued 3/303/2023 due to increased symptoms.) - Supine Posterior Pelvic Tilt  - 1 x daily - 7 x weekly - 3 sets - 10 reps - 5 seconds hold -Reclined Hip extension isometrics, leg straight to promote glute max muscle strengthening                                     R 10x5 seconds for 3 sets                                     L 10x5 seconds for 3 sets - Reclined Single Knee to Chest Stretch  - 1 x daily - 7 x weekly - 3 sets - 10 reps - 5 seconds hold   Standing with B UE assist, one foot on 2nd stair step with glute max squeeze to promote stretch of hip flexor muscles on stance LE  Stance LE: R 10x5 seconds for 3 sets    L 10x5 seconds for 3 sets  - Standing Bilateral Low Shoulder Row with Anchored Resistance  - 1 x daily - 7 x  weekly - 3 sets - 10 reps - 5 seconds hold  - Standing Hip Abduction with Resistance at Ankles and Counter Support  - 1 x daily - 7 x weekly - 3 sets - 10 reps (Yellow latex free band) - Supine March with Posterior Pelvic Tilt  - 1 x daily - 7 x weekly - 3 sets - 10 reps       PT Short Term Goals - 08/11/21 1152       PT SHORT TERM GOAL #1   Title Pt will be independent with her initial HEP to decrease pain, improve strength and ability to ambulate longer distances.    Baseline Pt has started her initial HEP (07/09/2021); Has been doing her HEP. Does not have questions (08/11/2021)    Time 3    Period Weeks    Status Achieved    Target Date 07/30/21              PT Long Term Goals - 01/19/22 0938       PT LONG TERM GOAL #1   Title Pt will have a decrease in low back pain to 4/10 or less at worst to promote ability to ambulate longer distances as well as perform standing tasks more comfortably.    Baseline 9/10 low back pain at most for the past 3 months. Pt also demonstrates B L5 radiating symptoms to knees (07/09/2021); 7-8/10 at most for the past 7 days, usually around 5 pm and later (08/11/2021); 7-8/10 at most for the past 7 days, usually in the evening. (09/01/2021); 7.5/10 back pain at most  for the past 7 days (09/24/2021); 10/10 last Friday (other  than that, 7-8/10) ( 11/03/2021); 7-8/10 low back pain at most for the past 7 days; has been in the care a lot.  (12/15/2021); 8/10 at worst for the past 7 days (12/31/2021); 7-8/10 at worst for the past 7 days. Pt however states that she is using her back brace less. (01/19/2022)    Time 8    Period Weeks    Status On-going    Target Date 02/25/22      PT LONG TERM GOAL #2   Title Pt will improve B hip extension, abduction and ER strength by at least 1/2 MMT grade to promote ability to perform standing tasks with less back pain.    Baseline Hip extension 4+/5 R, 3+/5 L, hip abduction 4/5 R,  4-/5 L, hip ER 4-/5 R, 3+/5 L (07/09/2021); hip  extensoin 4+/5 R, 5/5 L, hip abduction 4+/5 R and L, hip ER 4/5 R, 4/5 L (08/11/2021); hip extension 5/5 R and L, hip abduction 4+/5 R and L, hip ER 4/5 R and L (09/01/2021); hip extension 5/5 R and L, hip abduction 4+/5 R and L, hip ER 4/5 R and L, pt states L knee is the side that needs a knee replacement (09/24/2021); hip extension 5/5 R, 4+/5 L, hip abduction 4+/5 R, and 4/5 L, hip ER 4/5 R, 4/5 L (11/03/2021)    Time 8    Period Weeks    Status Achieved    Target Date 10/29/21      PT LONG TERM GOAL #3   Title Pt will improve her lumbar FOTO score by at least 10 points as a demonstration of improved function.    Baseline Lumbar Spine FOTO 44 (07/09/2021); Lumbar FOTO 40. Pt might have anwered her back function limitation question differently secondary to pt stating more limited in function but that was before starting PT. Might have answered increased limitation in function during today's session since starting PT instead which may have decrease her score. (08/11/2021).  47 (09/01/2021); 42 (09/24/2021); 40 (12/15/2021); 47 (12/31/2021)    Time 8    Period Weeks    Status On-going    Target Date 12/31/21              Plan - 01/21/22 0930     Clinical Impression Statement Continued working on improving posture, trunk and glute strength secondary to weakness and limited endurance. Pt demonstrates difficulty with trunk muscle endurance. Fair tolerance to today's session with reports of fatigue. Pt will benefit from continued skilled physical therapy services to decrease pain, improve strength and function.    Personal Factors and Comorbidities Age;Comorbidity 3+;Fitness;Past/Current Experience;Time since onset of injury/illness/exacerbation    Comorbidities B knee pain, arthritis, chronic kidney disease, COPD, dyspnea, HTN    Examination-Activity Limitations Squat;Lift;Stairs;Locomotion Level;Stand;Reach Overhead;Carry    Stability/Clinical Decision Making Stable/Uncomplicated    Rehab Potential Fair     PT Frequency 2x / week    PT Duration 8 weeks    PT Treatment/Interventions Therapeutic activities;Therapeutic exercise;Balance training;Neuromuscular re-education;Patient/family education;Manual techniques;Dry needling;Aquatic Therapy;Electrical Stimulation;Iontophoresis '4mg'$ /ml Dexamethasone    PT Next Visit Plan Posture, trunk and hip strengthening, manual techniques, modalities PRN    PT Home Exercise Plan Medbridge Access Code [email or web address]    Consulted and Agree with Plan of Care Patient  Joneen Boers PT, DPT  01/21/2022, 11:14 AM

## 2022-01-26 ENCOUNTER — Ambulatory Visit: Payer: Medicare Other

## 2022-01-26 DIAGNOSIS — G8929 Other chronic pain: Secondary | ICD-10-CM

## 2022-01-26 DIAGNOSIS — M545 Low back pain, unspecified: Secondary | ICD-10-CM | POA: Diagnosis not present

## 2022-01-26 DIAGNOSIS — M5416 Radiculopathy, lumbar region: Secondary | ICD-10-CM

## 2022-01-26 DIAGNOSIS — R262 Difficulty in walking, not elsewhere classified: Secondary | ICD-10-CM

## 2022-01-26 NOTE — Therapy (Signed)
OUTPATIENT PHYSICAL THERAPY TREATMENT NOTE        Patient Name: Julia Deleon MRN: [id number] DOB:[birth year], 76 y.o., female Today's Date: 01/26/2022  PCP: Idelle Crouch, MD REFERRING PROVIDER: Diamond Nickel, DO   PT End of Session - 01/26/22 1100     Visit Number [id number]    Number of Visits 81    Date for PT Re-Evaluation 02/25/22    Authorization Type Medicare Part A&B    PT Start Time 1101    PT Stop Time 1147    PT Time Calculation (min) 46 min    Activity Tolerance Patient tolerated treatment well    Behavior During Therapy WFL for tasks assessed/performed                                                 Past Medical History:  Diagnosis Date   Arthritis    Chronic kidney disease    COPD (chronic obstructive pulmonary disease) ([place])    pt denies    Dyspnea    Family history of adverse reaction to anesthesia    father was slow to wake up   Fuch's endothelial dystrophy    GERD (gastroesophageal reflux disease)    Hyperlipidemia    Hypertension    Hypothyroidism    Pneumonia    hx of    PONV (postoperative nausea and vomiting)    slow to wake up and PONV   Past Surgical History:  Procedure Laterality Date   ABDOMINAL HYSTERECTOMY     APPENDECTOMY     BACK SURGERY  09/26/2018   L4-5 PLIF by Dr. Arnoldo Morale   CARDIAC CATHETERIZATION     CHOLECYSTECTOMY     COLONOSCOPY WITH PROPOFOL N/A 10/23/2020   Procedure: COLONOSCOPY WITH PROPOFOL;  Surgeon: Virgel Manifold, MD;  Location: [facility] ENDOSCOPY;  Service: Endoscopy;  Laterality: N/A;   ESOPHAGOGASTRODUODENOSCOPY N/A 10/23/2020   Procedure: ESOPHAGOGASTRODUODENOSCOPY (EGD);  Surgeon: Virgel Manifold, MD;  Location: [facility] ENDOSCOPY;  Service: Endoscopy;  Laterality: N/A;   EYE SURGERY     FOOT SURGERY Right    pin removed left   laser vein surgery     NASAL SINUS SURGERY     RIGHT/LEFT HEART CATH AND CORONARY ANGIOGRAPHY N/A 12/06/2017   Procedure: RIGHT/LEFT HEART  CATH AND CORONARY ANGIOGRAPHY;  Surgeon: Yolonda Kida, MD;  Location: [place] [facility];  Service: Cardiovascular;  Laterality: N/A;   ROBOTIC ASSISTED LAPAROSCOPIC SACROCOLPOPEXY Bilateral 07/30/2020   Procedure: XI ROBOTIC ASSISTED LAPAROSCOPIC SACROCOLPOPEXY AND SUPRACERVICAL HYSTERECTOMY WITH BILATERAL SALPINGO OOPHERECTOMY;  Surgeon: Ardis Hughs, MD;  Location: [facility];  Service: Urology;  Laterality: Bilateral;  REQUESTING 4 HRS   TONSILLECTOMY     Patient Active Problem List   Diagnosis Date Noted   Essential hypertension 01/23/2021   Cystocele with prolapse 07/30/2020   Postsurgical arthrodesis status 07/25/2020   Facet arthropathy 04/08/2020   Family history of breast cancer 11/16/2019   Body mass index (BMI) 25.0-25.9, adult 07/24/2019   Chronic UTI 12/06/2018   Status post lumbar surgery 12/06/2018   Spondylolisthesis, lumbar region 09/26/2018   Aortic atherosclerosis ([place]) 08/31/2018   Food sensitivity headache 03/29/2018   CAD (coronary artery disease) 02/23/2018   Advanced care planning/counseling discussion 03/28/2017   Varicose veins of both lower extremities with complications 05/18/3233   Arthritis 12/09/2016   Vaginal atrophy 09/10/2015  Vasomotor symptoms due to menopause 09/10/2015   Onychomycosis due to dermatophyte 04/07/2015   Bursitis of right shoulder 02/26/2015   Environmental and seasonal allergies 02/26/2015   Other allergic rhinitis 02/26/2015   Osteoporosis 11/26/2014   COPD (chronic obstructive pulmonary disease) ([place]) 11/26/2014   Fuchs' corneal dystrophy 11/26/2014   Benign hypertensive renal disease 11/26/2014   Hyperlipidemia 11/26/2014   Chronic kidney disease, stage III (moderate) ([place]) 11/26/2014   Hypothyroidism 11/26/2014   Endothelial corneal dystrophy 11/26/2014     REFERRING DIAG: Chronic L low back pain  THERAPY DIAG:  Chronic bilateral low back pain, unspecified whether sciatica present  Muscle weakness  (generalized)  Difficulty in walking, not elsewhere classified  Radiculopathy, lumbar region  PERTINENT HISTORY: Chronic low back pain. Had L4/5 fusion in her low back May 2020 which helped her sleep. Had PT following surgery which helped. Bowel and bladder are not perfect but improving. Had pelvic floor therapy which did not help. The bowel issues might have started after the back surgery. Feels like her urinary tract system quit due to the pain after back surgery. Was placed on a catheter for 6 months after the surgery. Denies saddle anesthesia. Pt also states having a kidney infection May 2022 and was diagnosed at the end of [place] 2022. Took all summer to get rid of the kidney infection which wiped her out and weakned her back.   PRECAUTIONS: No known precautions  SUBJECTIVE:  Not having a great back day. Back feels weak. Has a hard time standing up straight.      PAIN:  Are you having pain? 3/10 low back pain currently     TODAY'S TREATMENT:  Objective     Latex allergies     Therapeutic exercise 01/26/22  Standing trunk side bend stretch   To the L 10x5 seconds for 2 sets  Standing L lateral shift correction at wall to improve posture 10x5 seconds for 3 sets  Standing with B UE assist   Hip abduction yellow band to promote glute med muscle strength    R 10x, then 5x2   L 10x, then 5x2   Standing B shoulder extension with scapular retraction red band 10x5 seconds for 2 sets  SLS with B UE assist and contralateral foot on first stair step  R 10x5 seconds for 3 sets  L 10x5 seconds for 3 sets   Reviewed and given as pard of her HEP. Pt demonstrated and verbalized understanding. Handout provided.   Side stepping 20 ft to the L and 20 ft to the R  Seated trunk flexion  Forward 30 seconds x 3   Then to the L 30 seconds for 2 sets   Then to the R 30 seconds for 2 sets    Improved exercise technique, movement at target joints, use of target muscles after mod  verbal, visual, tactile cues.        Response to treatment  Fair tolerance to today's session. Back fatigue reported after session.      Clinical impression  Worked on improving posture, decreasing L lateral shift, improving trunk and glute med strength  R > L, improving hip extension to decrease low back extension stress. Therapeutic rest breaks needed secondary to fatigue and limited postural muscle endurance. Fair tolerance to today's session. Back fatigue reported after session. Pt will benefit from continued skilled physical therapy services to decrease pain, improve strength and function.     PATIENT EDUCATION: Education details: there-ex, HEP Person educated: Patient Education  method: Explanation, Demonstration, Tactile cues, Verbal cues, and Handouts Education comprehension: verbalized understanding and returned demonstration   HOME EXERCISE PROGRAM: Access Code: [email or web address] URL: [email or web address] Date: 08/04/2021 Prepared by: Joneen Boers  Exercises - Seated Transversus Abdominis Bracing  - 5 x daily - 7 x weekly - 3 sets - 10 reps - 5 seconds hold - Seated Trunk Rotation - Arms Crossed  - 1 x daily - 7 x weekly - 3 sets - 10 reps - 5 seconds  hold - Standing Gluteal Sets  - 1 x daily - 7 x weekly - 3 sets - 10 reps - 5 seconds hold - Supine Double Knee to Chest  - 1 x daily - 7 x weekly - 2 sets - 15 reps (Discontinued 08/06/2021 secondary to increased symptoms)  - Seated Sciatic Tensioner  - 1 x daily - 7 x weekly - 2 sets - 15 reps (Discontinued 3/303/2023 due to increased symptoms.) - Supine Posterior Pelvic Tilt  - 1 x daily - 7 x weekly - 3 sets - 10 reps - 5 seconds hold -Reclined Hip extension isometrics, leg straight to promote glute max muscle strengthening                                     R 10x5 seconds for 3 sets                                     L 10x5 seconds for 3 sets - Reclined Single Knee to Chest Stretch  - 1 x daily - 7 x  weekly - 3 sets - 10 reps - 5 seconds hold   Standing with B UE assist, one foot on 2nd stair step with glute max squeeze to promote stretch of hip flexor muscles on stance LE  Stance LE: R 10x5 seconds for 3 sets    L 10x5 seconds for 3 sets  - Standing Bilateral Low Shoulder Row with Anchored Resistance  - 1 x daily - 7 x weekly - 3 sets - 10 reps - 5 seconds hold  - Standing Hip Abduction with Resistance at Ankles and Counter Support  - 1 x daily - 7 x weekly - 3 sets - 10 reps (Yellow latex free band) - Supine March with Posterior Pelvic Tilt  - 1 x daily - 7 x weekly - 3 sets - 10 reps   SLS with B UE assist and contralateral foot on first stair step  R 10x5 seconds for 3 sets  L 10x5 seconds for 3 sets       PT Short Term Goals - 08/11/21 1152       PT SHORT TERM GOAL #1   Title Pt will be independent with her initial HEP to decrease pain, improve strength and ability to ambulate longer distances.    Baseline Pt has started her initial HEP (07/09/2021); Has been doing her HEP. Does not have questions (08/11/2021)    Time 3    Period Weeks    Status Achieved    Target Date 07/30/21              PT Long Term Goals - 01/19/22 0938       PT LONG TERM GOAL #1   Title Pt will have a decrease in low back pain to 4/10 or less at worst  to promote ability to ambulate longer distances as well as perform standing tasks more comfortably.    Baseline 9/10 low back pain at most for the past 3 months. Pt also demonstrates B L5 radiating symptoms to knees (07/09/2021); 7-8/10 at most for the past 7 days, usually around 5 pm and later (08/11/2021); 7-8/10 at most for the past 7 days, usually in the evening. (09/01/2021); 7.5/10 back pain at most  for the past 7 days (09/24/2021); 10/10 last Friday (other than that, 7-8/10) ( 11/03/2021); 7-8/10 low back pain at most for the past 7 days; has been in the care a lot.  (12/15/2021); 8/10 at worst for the past 7 days (12/31/2021); 7-8/10 at worst for the  past 7 days. Pt however states that she is using her back brace less. (01/19/2022)    Time 8    Period Weeks    Status On-going    Target Date 02/25/22      PT LONG TERM GOAL #2   Title Pt will improve B hip extension, abduction and ER strength by at least 1/2 MMT grade to promote ability to perform standing tasks with less back pain.    Baseline Hip extension 4+/5 R, 3+/5 L, hip abduction 4/5 R,  4-/5 L, hip ER 4-/5 R, 3+/5 L (07/09/2021); hip extensoin 4+/5 R, 5/5 L, hip abduction 4+/5 R and L, hip ER 4/5 R, 4/5 L (08/11/2021); hip extension 5/5 R and L, hip abduction 4+/5 R and L, hip ER 4/5 R and L (09/01/2021); hip extension 5/5 R and L, hip abduction 4+/5 R and L, hip ER 4/5 R and L, pt states L knee is the side that needs a knee replacement (09/24/2021); hip extension 5/5 R, 4+/5 L, hip abduction 4+/5 R, and 4/5 L, hip ER 4/5 R, 4/5 L (11/03/2021)    Time 8    Period Weeks    Status Achieved    Target Date 10/29/21      PT LONG TERM GOAL #3   Title Pt will improve her lumbar FOTO score by at least 10 points as a demonstration of improved function.    Baseline Lumbar Spine FOTO 44 (07/09/2021); Lumbar FOTO 40. Pt might have anwered her back function limitation question differently secondary to pt stating more limited in function but that was before starting PT. Might have answered increased limitation in function during today's session since starting PT instead which may have decrease her score. (08/11/2021).  47 (09/01/2021); 42 (09/24/2021); 40 (12/15/2021); 47 (12/31/2021)    Time 8    Period Weeks    Status On-going    Target Date 12/31/21              Plan - 01/26/22 1059     Clinical Impression Statement Worked on improving posture, decreasing L lateral shift, improving trunk and glute med strength  R > L, improving hip extension to decrease low back extension stress. Therapeutic rest breaks needed secondary to fatigue and limited postural muscle endurance. Fair tolerance to today's session.  Back fatigue reported after session. Pt will benefit from continued skilled physical therapy services to decrease pain, improve strength and function.    Personal Factors and Comorbidities Age;Comorbidity 3+;Fitness;Past/Current Experience;Time since onset of injury/illness/exacerbation    Comorbidities B knee pain, arthritis, chronic kidney disease, COPD, dyspnea, HTN    Examination-Activity Limitations Squat;Lift;Stairs;Locomotion Level;Stand;Reach Overhead;Carry    Stability/Clinical Decision Making Stable/Uncomplicated    Clinical Decision Making Low    Rehab Potential Fair  PT Frequency 2x / week    PT Duration 8 weeks    PT Treatment/Interventions Therapeutic activities;Therapeutic exercise;Balance training;Neuromuscular re-education;Patient/family education;Manual techniques;Dry needling;Aquatic Therapy;Electrical Stimulation;Iontophoresis '4mg'$ /ml Dexamethasone    PT Next Visit Plan Posture, trunk and hip strengthening, manual techniques, modalities PRN    PT Home Exercise Plan Medbridge Access Code [email or web address]    Consulted and Agree with Plan of Care Patient                                                  Joneen Boers PT, DPT  01/26/2022, 12:01 PM

## 2022-01-27 ENCOUNTER — Ambulatory Visit: Payer: Medicare Other | Admitting: Dermatology

## 2022-01-27 ENCOUNTER — Ambulatory Visit (INDEPENDENT_AMBULATORY_CARE_PROVIDER_SITE_OTHER): Payer: Medicare Other | Admitting: Dermatology

## 2022-01-27 ENCOUNTER — Encounter: Payer: Self-pay | Admitting: Dermatology

## 2022-01-27 DIAGNOSIS — L578 Other skin changes due to chronic exposure to nonionizing radiation: Secondary | ICD-10-CM

## 2022-01-27 DIAGNOSIS — D229 Melanocytic nevi, unspecified: Secondary | ICD-10-CM

## 2022-01-27 DIAGNOSIS — L918 Other hypertrophic disorders of the skin: Secondary | ICD-10-CM

## 2022-01-27 DIAGNOSIS — L814 Other melanin hyperpigmentation: Secondary | ICD-10-CM

## 2022-01-27 DIAGNOSIS — Z1283 Encounter for screening for malignant neoplasm of skin: Secondary | ICD-10-CM

## 2022-01-27 DIAGNOSIS — I8393 Asymptomatic varicose veins of bilateral lower extremities: Secondary | ICD-10-CM

## 2022-01-27 DIAGNOSIS — L720 Epidermal cyst: Secondary | ICD-10-CM

## 2022-01-27 DIAGNOSIS — L820 Inflamed seborrheic keratosis: Secondary | ICD-10-CM

## 2022-01-27 DIAGNOSIS — D1801 Hemangioma of skin and subcutaneous tissue: Secondary | ICD-10-CM

## 2022-01-27 DIAGNOSIS — L821 Other seborrheic keratosis: Secondary | ICD-10-CM

## 2022-01-27 DIAGNOSIS — L719 Rosacea, unspecified: Secondary | ICD-10-CM

## 2022-01-27 NOTE — Progress Notes (Signed)
Follow-Up Visit   Subjective  Julia Deleon is a 76 y.o. female who presents for the following: Annual Exam (Skin cancer screening. Full body. No personal hx of skin cancer. States she has several areas of concern today).  They are new, raised, and get irritated by clothing.  The patient presents for Total-Body Skin Exam (TBSE) for skin cancer screening and mole check.  The patient has spots, moles and lesions to be evaluated, some may be new or changing and the patient has concerns that these could be cancer.   The following portions of the chart were reviewed this encounter and updated as appropriate:      Review of Systems: No other skin or systemic complaints except as noted in HPI or Assessment and Plan.   Objective  Well appearing patient in no apparent distress; mood and affect are within normal limits.  A full examination was performed including scalp, head, eyes, ears, nose, lips, neck, chest, axillae, abdomen, back, buttocks, bilateral upper extremities, bilateral lower extremities, hands, feet, fingers, toes, fingernails, and toenails. All findings within normal limits unless otherwise noted below.  Left Posteiror shoulder x1, left posterior lower neck x2, right flank at bra line x4, left lower back x1 (8) Erythematous keratotic or waxy stuck-on papule   right medial lower breast Subcutaneous nodule. 1.5cm  cheeks Erythema with telangiectasias at B/L cheeks   Assessment & Plan   Lentigines - Scattered tan macules - Due to sun exposure - Benign-appearing, observe - Recommend daily broad spectrum sunscreen SPF 30+ to sun-exposed areas, reapply every 2 hours as needed. - Call for any changes  Seborrheic Keratoses - Stuck-on, waxy, tan-brown papules and/or plaques  - Benign-appearing - Discussed benign etiology and prognosis. - Observe - Call for any changes  Melanocytic Nevi - Tan-brown and/or pink-flesh-colored symmetric macules and papules - Benign appearing  on exam today - Observation - Call clinic for new or changing moles - Recommend daily use of broad spectrum spf 30+ sunscreen to sun-exposed areas.   Hemangiomas - Red papules - Discussed benign nature - Observe - Call for any changes  Actinic Damage - Chronic condition, secondary to cumulative UV/sun exposure - diffuse scaly erythematous macules with underlying dyspigmentation - Recommend daily broad spectrum sunscreen SPF 30+ to sun-exposed areas, reapply every 2 hours as needed.  - Staying in the shade or wearing long sleeves, sun glasses (UVA+UVB protection) and wide brim hats (4-inch brim around the entire circumference of the hat) are also recommended for sun protection.  - Call for new or changing lesions.  Skin cancer screening performed today.  Acrochordons (Skin Tags). Axillae - Fleshy, skin-colored pedunculated papules - Benign appearing.  - Observe. - If desired, they can be removed with an in office procedure that is not covered by insurance. - Please call the clinic if you notice any new or changing lesions.   Inflamed seborrheic keratosis (8) Left Posteiror shoulder x1, left posterior lower neck x2, right flank at bra line x4, left lower back x1  Symptomatic, irritating, patient would like treated.  Destruction of lesion - Left Posteiror shoulder x1, left posterior lower neck x2, right flank at bra line x4, left lower back x1  Destruction method: cryotherapy   Informed consent: discussed and consent obtained   Timeout:  patient name, date of birth, surgical site, and procedure verified Lesion destroyed using liquid nitrogen: Yes   Region frozen until ice ball extended beyond lesion: Yes   Outcome: patient tolerated procedure well with no  complications   Post-procedure details: wound care instructions given   Additional details:  Prior to procedure, discussed risks of blister formation, small wound, skin dyspigmentation, or rare scar following cryotherapy.  Recommend Vaseline ointment to treated areas while healing.   Epidermal inclusion cyst right medial lower breast  Benign-appearing. Exam most consistent with an epidermal inclusion cyst. Discussed that a cyst is a benign growth that can grow over time and sometimes get irritated or inflamed. Recommend observation if it is not bothersome. Discussed option of surgical excision to remove it if it is growing, symptomatic, or other changes noted. Please call for new or changing lesions so they can be evaluated.    Rosacea cheeks  Rosacea is a chronic progressive skin condition usually affecting the face of adults, causing redness and/or acne bumps. It is treatable but not curable. It sometimes affects the eyes (ocular rosacea) as well. It may respond to topical and/or systemic medication and can flare with stress, sun exposure, alcohol, exercise and some foods.  Daily application of broad spectrum spf 30+ sunscreen to face is recommended to reduce flares.  Mild, no Rx treatment at this time Recommend daily broad spectrum sunscreen SPF 30+ daily    Varicose Veins/Spider Veins. Legs - Dilated blue, purple or red veins at the lower extremities - Reassured - Smaller vessels can be treated by sclerotherapy (a procedure to inject a medicine into the veins to make them disappear) if desired, but the treatment is not covered by insurance. Larger vessels may be covered if symptomatic and we would refer to vascular surgeon if treatment desired.   Return in about 1 year (around 01/28/2023) for TBSE.  I, Emelia Salisbury, CMA, am acting as scribe for Brendolyn Patty, MD.  Documentation: I have reviewed the above documentation for accuracy and completeness, and I agree with the above.  Brendolyn Patty MD

## 2022-01-27 NOTE — Patient Instructions (Addendum)
Cryotherapy Aftercare  Wash gently with soap and water everyday.   Apply Vaseline daily until healed.    Recommend daily broad spectrum sunscreen SPF 30+ to sun-exposed areas, reapply every 2 hours as needed. Call for new or changing lesions.  Staying in the shade or wearing long sleeves, sun glasses (UVA+UVB protection) and wide brim hats (4-inch brim around the entire circumference of the hat) are also recommended for sun protection.    Melanoma ABCDEs  Melanoma is the most dangerous type of skin cancer, and is the leading cause of death from skin disease.  You are more likely to develop melanoma if you: Have light-colored skin, light-colored eyes, or red or blond hair Spend a lot of time in the sun Tan regularly, either outdoors or in a tanning bed Have had blistering sunburns, especially during childhood Have a close family member who has had a melanoma Have atypical moles or large birthmarks  Early detection of melanoma is key since treatment is typically straightforward and cure rates are extremely high if we catch it early.   The first sign of melanoma is often a change in a mole or a new dark spot.  The ABCDE system is a way of remembering the signs of melanoma.  A for asymmetry:  The two halves do not match. B for border:  The edges of the growth are irregular. C for color:  A mixture of colors are present instead of an even brown color. D for diameter:  Melanomas are usually (but not always) greater than 6mm - the size of a pencil eraser. E for evolution:  The spot keeps changing in size, shape, and color.  Please check your skin once per month between visits. You can use a small mirror in front and a large mirror behind you to keep an eye on the back side or your body.   If you see any new or changing lesions before your next follow-up, please call to schedule a visit.  Please continue daily skin protection including broad spectrum sunscreen SPF 30+ to sun-exposed areas,  reapplying every 2 hours as needed when you're outdoors.   Staying in the shade or wearing long sleeves, sun glasses (UVA+UVB protection) and wide brim hats (4-inch brim around the entire circumference of the hat) are also recommended for sun protection.       Due to recent changes in healthcare laws, you may see results of your pathology and/or laboratory studies on MyChart before the doctors have had a chance to review them. We understand that in some cases there may be results that are confusing or concerning to you. Please understand that not all results are received at the same time and often the doctors may need to interpret multiple results in order to provide you with the best plan of care or course of treatment. Therefore, we ask that you please give us 2 business days to thoroughly review all your results before contacting the office for clarification. Should we see a critical lab result, you will be contacted sooner.   If You Need Anything After Your Visit  If you have any questions or concerns for your doctor, please call our main line at [phone] and press option 4 to reach your doctor's medical assistant. If no one answers, please leave a voicemail as directed and we will return your call as soon as possible. Messages left after 4 pm will be answered the following business day.   You may also send us a message   via MyChart. We typically respond to MyChart messages within 1-2 business days.  For prescription refills, please ask your pharmacy to contact our office. Our fax number is [phone].  If you have an urgent issue when the clinic is closed that cannot wait until the next business day, you can page your doctor at the number below.    Please note that while we do our best to be available for urgent issues outside of office hours, we are not available 24/7.   If you have an urgent issue and are unable to reach us, you may choose to seek medical care at your doctor's  office, retail clinic, urgent care center, or emergency room.  If you have a medical emergency, please immediately call 911 or go to the emergency department.  Pager Numbers  - Dr. Kowalski: [phone]  - Dr. Moye: [phone]  - Dr. Stewart: [phone]  In the event of inclement weather, please call our main line at [phone] for an update on the status of any delays or closures.  Dermatology Medication Tips: Please keep the boxes that topical medications come in in order to help keep track of the instructions about where and how to use these. Pharmacies typically print the medication instructions only on the boxes and not directly on the medication tubes.   If your medication is too expensive, please contact our office at [phone] option 4 or send us a message through MyChart.   We are unable to tell what your co-pay for medications will be in advance as this is different depending on your insurance coverage. However, we may be able to find a substitute medication at lower cost or fill out paperwork to get insurance to cover a needed medication.   If a prior authorization is required to get your medication covered by your insurance company, please allow us 1-2 business days to complete this process.  Drug prices often vary depending on where the prescription is filled and some pharmacies may offer cheaper prices.  The website www.goodrx.com contains coupons for medications through different pharmacies. The prices here do not account for what the cost may be with help from insurance (it may be cheaper with your insurance), but the website can give you the price if you did not use any insurance.  - You can print the associated coupon and take it with your prescription to the pharmacy.  - You may also stop by our office during regular business hours and pick up a GoodRx coupon card.  - If you need your prescription sent electronically to a different pharmacy, notify our office  through [facility] MyChart or by phone at [phone] option 4.     Si Usted Necesita Algo Despus de Su Visita  Tambin puede enviarnos un mensaje a travs de MyChart. Por lo general respondemos a los mensajes de MyChart en el transcurso de 1 a 2 das hbiles.  Para renovar recetas, por favor pida a su farmacia que se ponga en contacto con nuestra oficina. Nuestro nmero de fax es el [phone].  Si tiene un asunto urgente cuando la clnica est cerrada y que no puede esperar hasta el siguiente da hbil, puede llamar/localizar a su doctor(a) al nmero que aparece a continuacin.   Por favor, tenga en cuenta que aunque hacemos todo lo posible para estar disponibles para asuntos urgentes fuera del horario de oficina, no estamos disponibles las 24 horas del da, los 7 das de la semana.   Si tiene un problema urgente y   no puede comunicarse con nosotros, puede optar por buscar atencin mdica  en el consultorio de su doctor(a), en una clnica privada, en un centro de atencin urgente o en una sala de emergencias.  Si tiene una emergencia mdica, por favor llame inmediatamente al 911 o vaya a la sala de emergencias.  Nmeros de bper  - Dr. Kowalski: [phone]  - Dra. Moye: [phone]  - Dra. Stewart: [phone]  En caso de inclemencias del tiempo, por favor llame a nuestra lnea principal al [phone] para una actualizacin sobre el estado de cualquier retraso o cierre.  Consejos para la medicacin en dermatologa: Por favor, guarde las cajas en las que vienen los medicamentos de uso tpico para ayudarle a seguir las instrucciones sobre dnde y cmo usarlos. Las farmacias generalmente imprimen las instrucciones del medicamento slo en las cajas y no directamente en los tubos del medicamento.   Si su medicamento es muy caro, por favor, pngase en contacto con nuestra oficina llamando al [phone] y presione la opcin 4 o envenos un mensaje a travs de MyChart.   No  podemos decirle cul ser su copago por los medicamentos por adelantado ya que esto es diferente dependiendo de la cobertura de su seguro. Sin embargo, es posible que podamos encontrar un medicamento sustituto a menor costo o llenar un formulario para que el seguro cubra el medicamento que se considera necesario.   Si se requiere una autorizacin previa para que su compaa de seguros cubra su medicamento, por favor permtanos de 1 a 2 das hbiles para completar este proceso.  Los precios de los medicamentos varan con frecuencia dependiendo del lugar de dnde se surte la receta y alguna farmacias pueden ofrecer precios ms baratos.  El sitio web www.goodrx.com tiene cupones para medicamentos de diferentes farmacias. Los precios aqu no tienen en cuenta lo que podra costar con la ayuda del seguro (puede ser ms barato con su seguro), pero el sitio web puede darle el precio si no utiliz ningn seguro.  - Puede imprimir el cupn correspondiente y llevarlo con su receta a la farmacia.  - Tambin puede pasar por nuestra oficina durante el horario de atencin regular y recoger una tarjeta de cupones de GoodRx.  - Si necesita que su receta se enve electrnicamente a una farmacia diferente, informe a nuestra oficina a travs de MyChart de [facility] o por telfono llamando al [phone] y presione la opcin 4.  

## 2022-01-28 ENCOUNTER — Ambulatory Visit: Payer: Medicare Other

## 2022-01-28 DIAGNOSIS — M5416 Radiculopathy, lumbar region: Secondary | ICD-10-CM

## 2022-01-28 DIAGNOSIS — M6281 Muscle weakness (generalized): Secondary | ICD-10-CM

## 2022-01-28 DIAGNOSIS — M545 Low back pain, unspecified: Secondary | ICD-10-CM | POA: Diagnosis not present

## 2022-01-28 DIAGNOSIS — R262 Difficulty in walking, not elsewhere classified: Secondary | ICD-10-CM

## 2022-01-28 DIAGNOSIS — M62838 Other muscle spasm: Secondary | ICD-10-CM

## 2022-01-28 DIAGNOSIS — G8929 Other chronic pain: Secondary | ICD-10-CM

## 2022-01-28 NOTE — Therapy (Signed)
OUTPATIENT PHYSICAL THERAPY TREATMENT NOTE        Patient Name: Julia Deleon MRN: [id number] DOB:Dec 25, 1945, 76 y.o., female Today's Date: 01/28/2022  PCP: Idelle Crouch, MD REFERRING PROVIDER: Diamond Nickel, DO   PT End of Session - 01/28/22 0932     Visit Number 24    Number of Visits 39    Date for PT Re-Evaluation 02/25/22    Authorization Type Medicare Part A&B    PT Start Time 0932    PT Stop Time 1013    PT Time Calculation (min) 41 min    Activity Tolerance Patient tolerated treatment well    Behavior During Therapy WFL for tasks assessed/performed                                                  Past Medical History:  Diagnosis Date   Arthritis    Chronic kidney disease    COPD (chronic obstructive pulmonary disease) ([place])    pt denies    Dyspnea    Family history of adverse reaction to anesthesia    father was slow to wake up   Fuch's endothelial dystrophy    GERD (gastroesophageal reflux disease)    Hyperlipidemia    Hypertension    Hypothyroidism    Pneumonia    hx of    PONV (postoperative nausea and vomiting)    slow to wake up and PONV   Past Surgical History:  Procedure Laterality Date   ABDOMINAL HYSTERECTOMY     APPENDECTOMY     BACK SURGERY  09/26/2018   L4-5 PLIF by Dr. Arnoldo Morale   CARDIAC CATHETERIZATION     CHOLECYSTECTOMY     COLONOSCOPY WITH PROPOFOL N/A 10/23/2020   Procedure: COLONOSCOPY WITH PROPOFOL;  Surgeon: Virgel Manifold, MD;  Location: [facility] ENDOSCOPY;  Service: Endoscopy;  Laterality: N/A;   ESOPHAGOGASTRODUODENOSCOPY N/A 10/23/2020   Procedure: ESOPHAGOGASTRODUODENOSCOPY (EGD);  Surgeon: Virgel Manifold, MD;  Location: [facility] ENDOSCOPY;  Service: Endoscopy;  Laterality: N/A;   EYE SURGERY     FOOT SURGERY Right    pin removed left   laser vein surgery     NASAL SINUS SURGERY     RIGHT/LEFT HEART CATH AND CORONARY ANGIOGRAPHY N/A 12/06/2017   Procedure: RIGHT/LEFT  HEART CATH AND CORONARY ANGIOGRAPHY;  Surgeon: Yolonda Kida, MD;  Location: [facility];  Service: Cardiovascular;  Laterality: N/A;   ROBOTIC ASSISTED LAPAROSCOPIC SACROCOLPOPEXY Bilateral 07/30/2020   Procedure: XI ROBOTIC ASSISTED LAPAROSCOPIC SACROCOLPOPEXY AND SUPRACERVICAL HYSTERECTOMY WITH BILATERAL SALPINGO OOPHERECTOMY;  Surgeon: Ardis Hughs, MD;  Location: [facility];  Service: Urology;  Laterality: Bilateral;  REQUESTING 4 HRS   TONSILLECTOMY     Patient Active Problem List   Diagnosis Date Noted   Essential hypertension 01/23/2021   Cystocele with prolapse 07/30/2020   Postsurgical arthrodesis status 07/25/2020   Facet arthropathy 04/08/2020   Family history of breast cancer 11/16/2019   Body mass index (BMI) 25.0-25.9, adult 07/24/2019   Chronic UTI 12/06/2018   Status post lumbar surgery 12/06/2018   Spondylolisthesis, lumbar region 09/26/2018   Aortic atherosclerosis ([place]) 08/31/2018   Food sensitivity headache 03/29/2018   CAD (coronary artery disease) 02/23/2018   Advanced care planning/counseling discussion 03/28/2017   Varicose veins of both lower extremities with complications 82/42/3536   Arthritis 12/09/2016   Vaginal atrophy 09/10/2015  Vasomotor symptoms due to menopause 09/10/2015   Onychomycosis due to dermatophyte 04/07/2015   Bursitis of right shoulder 02/26/2015   Environmental and seasonal allergies 02/26/2015   Other allergic rhinitis 02/26/2015   Osteoporosis 11/26/2014   COPD (chronic obstructive pulmonary disease) ([place]) 11/26/2014   Fuchs' corneal dystrophy 11/26/2014   Benign hypertensive renal disease 11/26/2014   Hyperlipidemia 11/26/2014   Chronic kidney disease, stage III (moderate) ([place]) 11/26/2014   Hypothyroidism 11/26/2014   Endothelial corneal dystrophy 11/26/2014     REFERRING DIAG: Chronic L low back pain  THERAPY DIAG:  Chronic bilateral low back pain, unspecified whether sciatica present  Muscle weakness  (generalized)  Difficulty in walking, not elsewhere classified  Radiculopathy, lumbar region  PERTINENT HISTORY: Chronic low back pain. Had L4/5 fusion in her low back May 2020 which helped her sleep. Had PT following surgery which helped. Bowel and bladder are not perfect but improving. Had pelvic floor therapy which did not help. The bowel issues might have started after the back surgery. Feels like her urinary tract system quit due to the pain after back surgery. Was placed on a catheter for 6 months after the surgery. Denies saddle anesthesia. Pt also states having a kidney infection May 2022 and was diagnosed at the end of [place] 2022. Took all summer to get rid of the kidney infection which wiped her out and weakned her back.   PRECAUTIONS: No known precautions  SUBJECTIVE:  Back is feeling a little better today. Back was tired after last session.      PAIN:  Are you having pain? 2-3/10 low back pain currently     TODAY'S TREATMENT:  Objective     Latex allergies     Therapeutic exercise 01/28/22  Standing L lateral shift correction at wall to improve posture 10x5 seconds for 3 sets  Standing trunk side bend stretch   To the L 5x10 seconds for 2 sets to stretch R side.   Standing B shoulder extension with scapular retraction red band 5x10 seconds for 3 sets   Standing with B UE assist   Hip abduction yellow band to promote glute med muscle strength    R 5x3   L 5x3  Standing R shoulder adduction red band 5x5 seconds for 2 sets to decrease L lateral lean   Seated trunk flexion  Forward 30 seconds x 3    SLS with B UE assist and contralateral foot on first stair step  R 10x5 seconds for 3 sets  L 10x5 seconds for 3 sets    Seated manually resisted R trunk lateral shift isometrics in neutral 10x5 seconds for 3 sets       Improved exercise technique, movement at target joints, use of target muscles after mod verbal, visual, tactile cues.         Response to treatment  Fair tolerance to today's session. Back fatigue reported after session.      Clinical impression  Continued working on decreasing L lateral shift posture, improving trunk and glute strength and endurance in upright and standing positions predominantly to promote ability to stand in her kitchen and make supper with less back pain and less need for her back brace. Fair tolerance to today's session. Back fatigue reported after session. Pt will benefit from continued skilled physical therapy services to decrease pain, improve strength and function.     PATIENT EDUCATION: Education details: there-ex, HEP Person educated: Patient Education method: Explanation, Demonstration, Tactile cues, Verbal cues, and Handouts Education comprehension:  verbalized understanding and returned demonstration   HOME EXERCISE PROGRAM: Access Code: [email or web address] URL: [email or web address] Date: 08/04/2021 Prepared by: Joneen Boers  Exercises - Seated Transversus Abdominis Bracing  - 5 x daily - 7 x weekly - 3 sets - 10 reps - 5 seconds hold - Seated Trunk Rotation - Arms Crossed  - 1 x daily - 7 x weekly - 3 sets - 10 reps - 5 seconds  hold - Standing Gluteal Sets  - 1 x daily - 7 x weekly - 3 sets - 10 reps - 5 seconds hold - Supine Double Knee to Chest  - 1 x daily - 7 x weekly - 2 sets - 15 reps (Discontinued 08/06/2021 secondary to increased symptoms)  - Seated Sciatic Tensioner  - 1 x daily - 7 x weekly - 2 sets - 15 reps (Discontinued 3/303/2023 due to increased symptoms.) - Supine Posterior Pelvic Tilt  - 1 x daily - 7 x weekly - 3 sets - 10 reps - 5 seconds hold -Reclined Hip extension isometrics, leg straight to promote glute max muscle strengthening                                     R 10x5 seconds for 3 sets                                     L 10x5 seconds for 3 sets - Reclined Single Knee to Chest Stretch  - 1 x daily - 7 x weekly - 3 sets - 10 reps - 5  seconds hold   Standing with B UE assist, one foot on 2nd stair step with glute max squeeze to promote stretch of hip flexor muscles on stance LE  Stance LE: R 10x5 seconds for 3 sets    L 10x5 seconds for 3 sets  - Standing Bilateral Low Shoulder Row with Anchored Resistance  - 1 x daily - 7 x weekly - 3 sets - 10 reps - 5 seconds hold  - Standing Hip Abduction with Resistance at Ankles and Counter Support  - 1 x daily - 7 x weekly - 3 sets - 10 reps (Yellow latex free band) - Supine March with Posterior Pelvic Tilt  - 1 x daily - 7 x weekly - 3 sets - 10 reps   SLS with B UE assist and contralateral foot on first stair step  R 10x5 seconds for 3 sets  L 10x5 seconds for 3 sets       PT Short Term Goals - 08/11/21 1152       PT SHORT TERM GOAL #1   Title Pt will be independent with her initial HEP to decrease pain, improve strength and ability to ambulate longer distances.    Baseline Pt has started her initial HEP (07/09/2021); Has been doing her HEP. Does not have questions (08/11/2021)    Time 3    Period Weeks    Status Achieved    Target Date 07/30/21              PT Long Term Goals - 01/19/22 0938       PT LONG TERM GOAL #1   Title Pt will have a decrease in low back pain to 4/10 or less at worst to promote ability to ambulate longer distances as well as perform  standing tasks more comfortably.    Baseline 9/10 low back pain at most for the past 3 months. Pt also demonstrates B L5 radiating symptoms to knees (07/09/2021); 7-8/10 at most for the past 7 days, usually around 5 pm and later (08/11/2021); 7-8/10 at most for the past 7 days, usually in the evening. (09/01/2021); 7.5/10 back pain at most  for the past 7 days (09/24/2021); 10/10 last Friday (other than that, 7-8/10) ( 11/03/2021); 7-8/10 low back pain at most for the past 7 days; has been in the care a lot.  (12/15/2021); 8/10 at worst for the past 7 days (12/31/2021); 7-8/10 at worst for the past 7 days. Pt however  states that she is using her back brace less. (01/19/2022)    Time 8    Period Weeks    Status On-going    Target Date 02/25/22      PT LONG TERM GOAL #2   Title Pt will improve B hip extension, abduction and ER strength by at least 1/2 MMT grade to promote ability to perform standing tasks with less back pain.    Baseline Hip extension 4+/5 R, 3+/5 L, hip abduction 4/5 R,  4-/5 L, hip ER 4-/5 R, 3+/5 L (07/09/2021); hip extensoin 4+/5 R, 5/5 L, hip abduction 4+/5 R and L, hip ER 4/5 R, 4/5 L (08/11/2021); hip extension 5/5 R and L, hip abduction 4+/5 R and L, hip ER 4/5 R and L (09/01/2021); hip extension 5/5 R and L, hip abduction 4+/5 R and L, hip ER 4/5 R and L, pt states L knee is the side that needs a knee replacement (09/24/2021); hip extension 5/5 R, 4+/5 L, hip abduction 4+/5 R, and 4/5 L, hip ER 4/5 R, 4/5 L (11/03/2021)    Time 8    Period Weeks    Status Achieved    Target Date 10/29/21      PT LONG TERM GOAL #3   Title Pt will improve her lumbar FOTO score by at least 10 points as a demonstration of improved function.    Baseline Lumbar Spine FOTO 44 (07/09/2021); Lumbar FOTO 40. Pt might have anwered her back function limitation question differently secondary to pt stating more limited in function but that was before starting PT. Might have answered increased limitation in function during today's session since starting PT instead which may have decrease her score. (08/11/2021).  47 (09/01/2021); 42 (09/24/2021); 40 (12/15/2021); 47 (12/31/2021)    Time 8    Period Weeks    Status On-going    Target Date 12/31/21              Plan - 01/28/22 0932     Clinical Impression Statement Continued working on decreasing L lateral shift posture, improving trunk and glute strength and endurance in upright and standing positions predominantly to promote ability to stand in her kitchen and make supper with less back pain and less need for her back brace. Fair tolerance to today's session. Back fatigue  reported after session. Pt will benefit from continued skilled physical therapy services to decrease pain, improve strength and function.    Personal Factors and Comorbidities Age;Comorbidity 3+;Fitness;Past/Current Experience;Time since onset of injury/illness/exacerbation    Comorbidities B knee pain, arthritis, chronic kidney disease, COPD, dyspnea, HTN    Examination-Activity Limitations Squat;Lift;Stairs;Locomotion Level;Stand;Reach Overhead;Carry    Stability/Clinical Decision Making Stable/Uncomplicated    Rehab Potential Fair    PT Frequency 2x / week    PT Duration 8 weeks  PT Treatment/Interventions Therapeutic activities;Therapeutic exercise;Balance training;Neuromuscular re-education;Patient/family education;Manual techniques;Dry needling;Aquatic Therapy;Electrical Stimulation;Iontophoresis '4mg'$ /ml Dexamethasone    PT Next Visit Plan Posture, trunk and hip strengthening, manual techniques, modalities PRN    PT Home Exercise Plan Medbridge Access Code [email or web address]    Consulted and Agree with Plan of Care Patient                                                   Joneen Boers PT, DPT  01/28/2022, 1:48 PM

## 2022-01-29 ENCOUNTER — Emergency Department (HOSPITAL_COMMUNITY): Payer: Medicare Other

## 2022-01-29 ENCOUNTER — Encounter (HOSPITAL_COMMUNITY): Payer: Self-pay

## 2022-01-29 ENCOUNTER — Emergency Department (HOSPITAL_COMMUNITY): Payer: Medicare Other | Admitting: Certified Registered Nurse Anesthetist

## 2022-01-29 ENCOUNTER — Inpatient Hospital Stay (HOSPITAL_COMMUNITY): Payer: Medicare Other

## 2022-01-29 ENCOUNTER — Encounter (HOSPITAL_COMMUNITY)
Admission: EM | Disposition: A | Discharge status: Rehabilitation Facility | Payer: Self-pay | Source: Home / Self Care | Attending: Neurology

## 2022-01-29 ENCOUNTER — Inpatient Hospital Stay (HOSPITAL_COMMUNITY)
Admission: EM | Admit: 2022-01-29 | Discharge: 2022-02-16 | DRG: 023 | Disposition: A | Discharge status: Rehabilitation Facility | Payer: Medicare Other | Attending: Neurology | Admitting: Neurology

## 2022-01-29 ENCOUNTER — Other Ambulatory Visit: Payer: Self-pay

## 2022-01-29 DIAGNOSIS — M1990 Unspecified osteoarthritis, unspecified site: Secondary | ICD-10-CM | POA: Diagnosis present

## 2022-01-29 DIAGNOSIS — Z9049 Acquired absence of other specified parts of digestive tract: Secondary | ICD-10-CM | POA: Diagnosis not present

## 2022-01-29 DIAGNOSIS — R519 Headache, unspecified: Secondary | ICD-10-CM | POA: Diagnosis not present

## 2022-01-29 DIAGNOSIS — W1830XA Fall on same level, unspecified, initial encounter: Secondary | ICD-10-CM | POA: Diagnosis present

## 2022-01-29 DIAGNOSIS — Z006 Encounter for examination for normal comparison and control in clinical research program: Secondary | ICD-10-CM | POA: Diagnosis not present

## 2022-01-29 DIAGNOSIS — Z20822 Contact with and (suspected) exposure to covid-19: Secondary | ICD-10-CM | POA: Diagnosis present

## 2022-01-29 DIAGNOSIS — R482 Apraxia: Secondary | ICD-10-CM | POA: Diagnosis present

## 2022-01-29 DIAGNOSIS — I672 Cerebral atherosclerosis: Secondary | ICD-10-CM

## 2022-01-29 DIAGNOSIS — G8194 Hemiplegia, unspecified affecting left nondominant side: Secondary | ICD-10-CM | POA: Diagnosis present

## 2022-01-29 DIAGNOSIS — Z823 Family history of stroke: Secondary | ICD-10-CM | POA: Diagnosis not present

## 2022-01-29 DIAGNOSIS — N179 Acute kidney failure, unspecified: Secondary | ICD-10-CM | POA: Diagnosis present

## 2022-01-29 DIAGNOSIS — D72829 Elevated white blood cell count, unspecified: Secondary | ICD-10-CM | POA: Diagnosis not present

## 2022-01-29 DIAGNOSIS — I471 Supraventricular tachycardia, unspecified: Secondary | ICD-10-CM | POA: Diagnosis not present

## 2022-01-29 DIAGNOSIS — G9389 Other specified disorders of brain: Secondary | ICD-10-CM | POA: Diagnosis not present

## 2022-01-29 DIAGNOSIS — I129 Hypertensive chronic kidney disease with stage 1 through stage 4 chronic kidney disease, or unspecified chronic kidney disease: Secondary | ICD-10-CM | POA: Diagnosis present

## 2022-01-29 DIAGNOSIS — Z79899 Other long term (current) drug therapy: Secondary | ICD-10-CM

## 2022-01-29 DIAGNOSIS — R1310 Dysphagia, unspecified: Secondary | ICD-10-CM | POA: Diagnosis present

## 2022-01-29 DIAGNOSIS — J441 Chronic obstructive pulmonary disease with (acute) exacerbation: Secondary | ICD-10-CM | POA: Diagnosis not present

## 2022-01-29 DIAGNOSIS — I2583 Coronary atherosclerosis due to lipid rich plaque: Secondary | ICD-10-CM | POA: Diagnosis not present

## 2022-01-29 DIAGNOSIS — R29705 NIHSS score 5: Secondary | ICD-10-CM | POA: Diagnosis not present

## 2022-01-29 DIAGNOSIS — R1312 Dysphagia, oropharyngeal phase: Secondary | ICD-10-CM | POA: Diagnosis present

## 2022-01-29 DIAGNOSIS — M25519 Pain in unspecified shoulder: Secondary | ICD-10-CM | POA: Diagnosis not present

## 2022-01-29 DIAGNOSIS — E890 Postprocedural hypothyroidism: Secondary | ICD-10-CM | POA: Diagnosis present

## 2022-01-29 DIAGNOSIS — Z881 Allergy status to other antibiotic agents status: Secondary | ICD-10-CM

## 2022-01-29 DIAGNOSIS — I63511 Cerebral infarction due to unspecified occlusion or stenosis of right middle cerebral artery: Principal | ICD-10-CM | POA: Diagnosis present

## 2022-01-29 DIAGNOSIS — E872 Acidosis, unspecified: Secondary | ICD-10-CM | POA: Diagnosis present

## 2022-01-29 DIAGNOSIS — I69391 Dysphagia following cerebral infarction: Secondary | ICD-10-CM | POA: Diagnosis not present

## 2022-01-29 DIAGNOSIS — E878 Other disorders of electrolyte and fluid balance, not elsewhere classified: Secondary | ICD-10-CM | POA: Diagnosis present

## 2022-01-29 DIAGNOSIS — E039 Hypothyroidism, unspecified: Secondary | ICD-10-CM | POA: Diagnosis present

## 2022-01-29 DIAGNOSIS — I639 Cerebral infarction, unspecified: Secondary | ICD-10-CM | POA: Diagnosis present

## 2022-01-29 DIAGNOSIS — J449 Chronic obstructive pulmonary disease, unspecified: Secondary | ICD-10-CM | POA: Diagnosis present

## 2022-01-29 DIAGNOSIS — Z888 Allergy status to other drugs, medicaments and biological substances status: Secondary | ICD-10-CM

## 2022-01-29 DIAGNOSIS — Z9079 Acquired absence of other genital organ(s): Secondary | ICD-10-CM

## 2022-01-29 DIAGNOSIS — E785 Hyperlipidemia, unspecified: Secondary | ICD-10-CM | POA: Diagnosis present

## 2022-01-29 DIAGNOSIS — H518 Other specified disorders of binocular movement: Secondary | ICD-10-CM | POA: Diagnosis present

## 2022-01-29 DIAGNOSIS — Z981 Arthrodesis status: Secondary | ICD-10-CM

## 2022-01-29 DIAGNOSIS — Y838 Other surgical procedures as the cause of abnormal reaction of the patient, or of later complication, without mention of misadventure at the time of the procedure: Secondary | ICD-10-CM | POA: Diagnosis not present

## 2022-01-29 DIAGNOSIS — G8929 Other chronic pain: Secondary | ICD-10-CM | POA: Diagnosis present

## 2022-01-29 DIAGNOSIS — B952 Enterococcus as the cause of diseases classified elsewhere: Secondary | ICD-10-CM | POA: Diagnosis not present

## 2022-01-29 DIAGNOSIS — R414 Neurologic neglect syndrome: Secondary | ICD-10-CM | POA: Diagnosis present

## 2022-01-29 DIAGNOSIS — I10 Essential (primary) hypertension: Secondary | ICD-10-CM | POA: Diagnosis not present

## 2022-01-29 DIAGNOSIS — I2510 Atherosclerotic heart disease of native coronary artery without angina pectoris: Secondary | ICD-10-CM

## 2022-01-29 DIAGNOSIS — Z7982 Long term (current) use of aspirin: Secondary | ICD-10-CM

## 2022-01-29 DIAGNOSIS — Z8249 Family history of ischemic heart disease and other diseases of the circulatory system: Secondary | ICD-10-CM | POA: Diagnosis not present

## 2022-01-29 DIAGNOSIS — I69354 Hemiplegia and hemiparesis following cerebral infarction affecting left non-dominant side: Secondary | ICD-10-CM | POA: Diagnosis present

## 2022-01-29 DIAGNOSIS — Z8673 Personal history of transient ischemic attack (TIA), and cerebral infarction without residual deficits: Secondary | ICD-10-CM

## 2022-01-29 DIAGNOSIS — I959 Hypotension, unspecified: Secondary | ICD-10-CM | POA: Diagnosis not present

## 2022-01-29 DIAGNOSIS — I9788 Other intraoperative complications of the circulatory system, not elsewhere classified: Secondary | ICD-10-CM | POA: Diagnosis not present

## 2022-01-29 DIAGNOSIS — K219 Gastro-esophageal reflux disease without esophagitis: Secondary | ICD-10-CM | POA: Diagnosis present

## 2022-01-29 DIAGNOSIS — N183 Chronic kidney disease, stage 3 unspecified: Secondary | ICD-10-CM | POA: Diagnosis present

## 2022-01-29 DIAGNOSIS — I6521 Occlusion and stenosis of right carotid artery: Secondary | ICD-10-CM | POA: Diagnosis present

## 2022-01-29 DIAGNOSIS — N390 Urinary tract infection, site not specified: Secondary | ICD-10-CM | POA: Diagnosis not present

## 2022-01-29 DIAGNOSIS — Z7902 Long term (current) use of antithrombotics/antiplatelets: Secondary | ICD-10-CM

## 2022-01-29 DIAGNOSIS — I63512 Cerebral infarction due to unspecified occlusion or stenosis of left middle cerebral artery: Secondary | ICD-10-CM | POA: Diagnosis not present

## 2022-01-29 DIAGNOSIS — Z91040 Latex allergy status: Secondary | ICD-10-CM

## 2022-01-29 DIAGNOSIS — Z90711 Acquired absence of uterus with remaining cervical stump: Secondary | ICD-10-CM

## 2022-01-29 DIAGNOSIS — I700 Atherosclerosis of aorta: Secondary | ICD-10-CM | POA: Diagnosis present

## 2022-01-29 DIAGNOSIS — G936 Cerebral edema: Secondary | ICD-10-CM | POA: Diagnosis not present

## 2022-01-29 DIAGNOSIS — I6529 Occlusion and stenosis of unspecified carotid artery: Secondary | ICD-10-CM

## 2022-01-29 DIAGNOSIS — R29810 Facial weakness: Secondary | ICD-10-CM | POA: Diagnosis present

## 2022-01-29 DIAGNOSIS — I69392 Facial weakness following cerebral infarction: Secondary | ICD-10-CM | POA: Diagnosis not present

## 2022-01-29 DIAGNOSIS — Z825 Family history of asthma and other chronic lower respiratory diseases: Secondary | ICD-10-CM

## 2022-01-29 DIAGNOSIS — M4316 Spondylolisthesis, lumbar region: Secondary | ICD-10-CM | POA: Diagnosis present

## 2022-01-29 DIAGNOSIS — N3000 Acute cystitis without hematuria: Secondary | ICD-10-CM | POA: Diagnosis not present

## 2022-01-29 DIAGNOSIS — Z90722 Acquired absence of ovaries, bilateral: Secondary | ICD-10-CM

## 2022-01-29 DIAGNOSIS — M25512 Pain in left shoulder: Secondary | ICD-10-CM | POA: Diagnosis not present

## 2022-01-29 DIAGNOSIS — R001 Bradycardia, unspecified: Secondary | ICD-10-CM | POA: Diagnosis not present

## 2022-01-29 DIAGNOSIS — N189 Chronic kidney disease, unspecified: Secondary | ICD-10-CM | POA: Diagnosis present

## 2022-01-29 DIAGNOSIS — R471 Dysarthria and anarthria: Secondary | ICD-10-CM | POA: Diagnosis present

## 2022-01-29 DIAGNOSIS — I6522 Occlusion and stenosis of left carotid artery: Secondary | ICD-10-CM | POA: Diagnosis not present

## 2022-01-29 DIAGNOSIS — J029 Acute pharyngitis, unspecified: Secondary | ICD-10-CM | POA: Diagnosis not present

## 2022-01-29 DIAGNOSIS — Z79890 Hormone replacement therapy (postmenopausal): Secondary | ICD-10-CM

## 2022-01-29 DIAGNOSIS — M545 Low back pain, unspecified: Secondary | ICD-10-CM | POA: Diagnosis present

## 2022-01-29 DIAGNOSIS — N952 Postmenopausal atrophic vaginitis: Secondary | ICD-10-CM | POA: Diagnosis present

## 2022-01-29 DIAGNOSIS — R159 Full incontinence of feces: Secondary | ICD-10-CM | POA: Diagnosis not present

## 2022-01-29 DIAGNOSIS — E7800 Pure hypercholesterolemia, unspecified: Secondary | ICD-10-CM | POA: Diagnosis not present

## 2022-01-29 DIAGNOSIS — Z7951 Long term (current) use of inhaled steroids: Secondary | ICD-10-CM

## 2022-01-29 DIAGNOSIS — R29715 NIHSS score 15: Secondary | ICD-10-CM | POA: Diagnosis present

## 2022-01-29 DIAGNOSIS — R339 Retention of urine, unspecified: Secondary | ICD-10-CM | POA: Diagnosis not present

## 2022-01-29 DIAGNOSIS — J988 Other specified respiratory disorders: Secondary | ICD-10-CM | POA: Diagnosis not present

## 2022-01-29 HISTORY — PX: IR CT HEAD LTD: IMG2386

## 2022-01-29 HISTORY — PX: RADIOLOGY WITH ANESTHESIA: SHX6223

## 2022-01-29 HISTORY — PX: IR INTRA CRAN STENT: IMG2345

## 2022-01-29 HISTORY — PX: IR PERCUTANEOUS ART THROMBECTOMY/INFUSION INTRACRANIAL INC DIAG ANGIO: IMG6087

## 2022-01-29 HISTORY — PX: IR INTRAVSC STENT CERV CAROTID W/O EMB-PROT MOD SED INC ANGIO: IMG2304

## 2022-01-29 HISTORY — PX: IR US GUIDE VASC ACCESS RIGHT: IMG2390

## 2022-01-29 LAB — RAPID URINE DRUG SCREEN, HOSP PERFORMED
Amphetamines: NOT DETECTED
Barbiturates: NOT DETECTED
Benzodiazepines: NOT DETECTED
Cocaine: NOT DETECTED
Opiates: NOT DETECTED
Tetrahydrocannabinol: NOT DETECTED

## 2022-01-29 LAB — ECHOCARDIOGRAM COMPLETE
AR max vel: 2.71 cm2
AV Peak grad: 7.7 mmHg
Ao pk vel: 1.39 m/s
Area-P 1/2: 3.77 cm2
Height: 64 in
P 1/2 time: 726 msec
S' Lateral: 2.2 cm
Weight: 2687.85 oz

## 2022-01-29 LAB — POCT I-STAT 7, (LYTES, BLD GAS, ICA,H+H)
Acid-base deficit: 6 mmol/L — ABNORMAL HIGH (ref 0.0–2.0)
Bicarbonate: 20.3 mmol/L (ref 20.0–28.0)
Calcium, Ion: 1.13 mmol/L — ABNORMAL LOW (ref 1.15–1.40)
HCT: 31 % — ABNORMAL LOW (ref 36.0–46.0)
Hemoglobin: 10.5 g/dL — ABNORMAL LOW (ref 12.0–15.0)
O2 Saturation: 100 %
Patient temperature: 35.5
Potassium: 3.8 mmol/L (ref 3.5–5.1)
Sodium: 140 mmol/L (ref 135–145)
TCO2: 22 mmol/L (ref 22–32)
pCO2 arterial: 38.4 mmHg (ref 32–48)
pH, Arterial: 7.324 — ABNORMAL LOW (ref 7.35–7.45)
pO2, Arterial: 316 mmHg — ABNORMAL HIGH (ref 83–108)

## 2022-01-29 LAB — URINALYSIS, MICROSCOPIC (REFLEX): Bacteria, UA: NONE SEEN

## 2022-01-29 LAB — URINALYSIS, ROUTINE W REFLEX MICROSCOPIC
Bilirubin Urine: NEGATIVE
Glucose, UA: NEGATIVE mg/dL
Ketones, ur: NEGATIVE mg/dL
Leukocytes,Ua: NEGATIVE
Nitrite: NEGATIVE
Protein, ur: NEGATIVE mg/dL
Specific Gravity, Urine: <1.005 — ABNORMAL LOW (ref 1.005–1.030)
pH: 6 (ref 5.0–8.0)

## 2022-01-29 LAB — DIFFERENTIAL
Abs Immature Granulocytes: 0.05 10*3/uL (ref 0.00–0.07)
Basophils Absolute: 0 10*3/uL (ref 0.0–0.1)
Basophils Relative: 0 %
Eosinophils Absolute: 0.1 10*3/uL (ref 0.0–0.5)
Eosinophils Relative: 2 %
Immature Granulocytes: 1 %
Lymphocytes Relative: 31 %
Lymphs Abs: 2 10*3/uL (ref 0.7–4.0)
Monocytes Absolute: 0.6 10*3/uL (ref 0.1–1.0)
Monocytes Relative: 8 %
Neutro Abs: 3.9 10*3/uL (ref 1.7–7.7)
Neutrophils Relative %: 58 %

## 2022-01-29 LAB — CBG MONITORING, ED: Glucose-Capillary: 96 mg/dL (ref 70–99)

## 2022-01-29 LAB — CBC
HCT: 36.8 % (ref 36.0–46.0)
Hemoglobin: 12.7 g/dL (ref 12.0–15.0)
MCH: 32.6 pg (ref 26.0–34.0)
MCHC: 34.5 g/dL (ref 30.0–36.0)
MCV: 94.6 fL (ref 80.0–100.0)
Platelets: 196 10*3/uL (ref 150–400)
RBC: 3.89 MIL/uL (ref 3.87–5.11)
RDW: 13 % (ref 11.5–15.5)
WBC: 6.7 10*3/uL (ref 4.0–10.5)
nRBC: 0 % (ref 0.0–0.2)

## 2022-01-29 LAB — I-STAT CHEM 8, ED
BUN: 38 mg/dL — ABNORMAL HIGH (ref 8–23)
Calcium, Ion: 1.12 mmol/L — ABNORMAL LOW (ref 1.15–1.40)
Chloride: 104 mmol/L (ref 98–111)
Creatinine, Ser: 1.4 mg/dL — ABNORMAL HIGH (ref 0.44–1.00)
Glucose, Bld: 90 mg/dL (ref 70–99)
HCT: 37 % (ref 36.0–46.0)
Hemoglobin: 12.6 g/dL (ref 12.0–15.0)
Potassium: 5.4 mmol/L — ABNORMAL HIGH (ref 3.5–5.1)
Sodium: 137 mmol/L (ref 135–145)
TCO2: 27 mmol/L (ref 22–32)

## 2022-01-29 LAB — RESP PANEL BY RT-PCR (FLU A&B, COVID) ARPGX2
Influenza A by PCR: NEGATIVE
Influenza B by PCR: NEGATIVE
SARS Coronavirus 2 by RT PCR: NEGATIVE

## 2022-01-29 LAB — PROTIME-INR
INR: 1.1 (ref 0.8–1.2)
Prothrombin Time: 13.9 seconds (ref 11.4–15.2)

## 2022-01-29 LAB — HEMOGLOBIN A1C
Hgb A1c MFr Bld: 5.4 % (ref 4.8–5.6)
Mean Plasma Glucose: 108.28 mg/dL

## 2022-01-29 LAB — APTT: aPTT: 28 seconds (ref 24–36)

## 2022-01-29 LAB — ETHANOL: Alcohol, Ethyl (B): <10 mg/dL (ref <10)

## 2022-01-29 SURGERY — IR WITH ANESTHESIA
Anesthesia: General

## 2022-01-29 MED ORDER — PHENYLEPHRINE HCL-NACL 20-0.9 MG/250ML-% IV SOLN
INTRAVENOUS | Status: DC | PRN
  Administered 2022-01-29: 50 ug/min via INTRAVENOUS

## 2022-01-29 MED ORDER — EPHEDRINE SULFATE-NACL 50-0.9 MG/10ML-% IV SOSY
PREFILLED_SYRINGE | INTRAVENOUS | Status: DC | PRN
  Administered 2022-01-29: 5 mg via INTRAVENOUS
  Administered 2022-01-29: 2.5 mg via INTRAVENOUS

## 2022-01-29 MED ORDER — IOHEXOL 300 MG/ML  SOLN
100.0000 mL | Freq: Once | INTRAMUSCULAR | Status: AC | PRN
  Administered 2022-01-29: 50 mL via INTRA_ARTERIAL

## 2022-01-29 MED ORDER — ACETAMINOPHEN 650 MG RE SUPP
650.0000 mg | RECTAL | Status: DC | PRN
  Filled 2022-01-29: qty 1

## 2022-01-29 MED ORDER — ACETAMINOPHEN 325 MG PO TABS: 650.0000 mg | ORAL_TABLET | ORAL | Status: DC | PRN

## 2022-01-29 MED ORDER — SODIUM CHLORIDE 0.9 % IV BOLUS: 1000.0000 mL | Freq: Once | INTRAVENOUS | Status: DC

## 2022-01-29 MED ORDER — DEXAMETHASONE SODIUM PHOSPHATE 10 MG/ML IJ SOLN
INTRAMUSCULAR | Status: DC | PRN
  Administered 2022-01-29: 4 mg via INTRAVENOUS

## 2022-01-29 MED ORDER — STROKE: EARLY STAGES OF RECOVERY BOOK: Freq: Once | Status: AC

## 2022-01-29 MED ORDER — CHLORHEXIDINE GLUCONATE CLOTH 2 % EX PADS
6.0000 | MEDICATED_PAD | Freq: Once | CUTANEOUS | Status: AC
  Administered 2022-01-29: 6 via TOPICAL

## 2022-01-29 MED ORDER — ACETAMINOPHEN 650 MG RE SUPP
650.0000 mg | RECTAL | Status: DC | PRN
  Administered 2022-01-29: 650 mg via RECTAL

## 2022-01-29 MED ORDER — ROCURONIUM BROMIDE 10 MG/ML (PF) SYRINGE
PREFILLED_SYRINGE | INTRAVENOUS | Status: DC | PRN
  Administered 2022-01-29: 15 mg via INTRAVENOUS
  Administered 2022-01-29: 10 mg via INTRAVENOUS
  Administered 2022-01-29: 50 mg via INTRAVENOUS

## 2022-01-29 MED ORDER — LIDOCAINE 2% (20 MG/ML) 5 ML SYRINGE
INTRAMUSCULAR | Status: DC | PRN
  Administered 2022-01-29: 60 mg via INTRAVENOUS

## 2022-01-29 MED ORDER — SODIUM CHLORIDE 0.9 % IV SOLN
2.0000 ug/kg/min | INTRAVENOUS | Status: DC
  Administered 2022-01-29 – 2022-01-30 (×2): 2 ug/kg/min via INTRAVENOUS
  Filled 2022-01-29 (×3): qty 50

## 2022-01-29 MED ORDER — SODIUM CHLORIDE 0.9 % IV SOLN: INTRAVENOUS | Status: AC

## 2022-01-29 MED ORDER — SODIUM CHLORIDE 0.9 % IV SOLN
INTRAVENOUS | Status: DC | PRN
  Administered 2022-01-29: 2 ug/kg/min via INTRAVENOUS

## 2022-01-29 MED ORDER — PROPOFOL 10 MG/ML IV BOLUS
INTRAVENOUS | Status: DC | PRN
  Administered 2022-01-29: 120 mg via INTRAVENOUS
  Administered 2022-01-29: 50 mg via INTRAVENOUS

## 2022-01-29 MED ORDER — LEVOTHYROXINE SODIUM 75 MCG PO TABS
75.0000 ug | ORAL_TABLET | Freq: Every day | ORAL | Status: DC
  Filled 2022-01-29: qty 1

## 2022-01-29 MED ORDER — ACETAMINOPHEN 160 MG/5ML PO SOLN
650.0000 mg | ORAL | Status: DC | PRN
  Administered 2022-01-30 – 2022-02-09 (×16): 650 mg
  Filled 2022-01-29 (×15): qty 20.3

## 2022-01-29 MED ORDER — ONDANSETRON HCL 4 MG/2ML IJ SOLN
INTRAMUSCULAR | Status: DC | PRN
  Administered 2022-01-29: 4 mg via INTRAVENOUS

## 2022-01-29 MED ORDER — LOSARTAN POTASSIUM 50 MG PO TABS: 100.0000 mg | ORAL_TABLET | Freq: Every day | ORAL | Status: DC

## 2022-01-29 MED ORDER — CHLORHEXIDINE GLUCONATE CLOTH 2 % EX PADS
6.0000 | MEDICATED_PAD | Freq: Every day | CUTANEOUS | Status: DC
  Administered 2022-01-29 – 2022-02-16 (×17): 6 via TOPICAL

## 2022-01-29 MED ORDER — CLEVIDIPINE BUTYRATE 0.5 MG/ML IV EMUL: 0.0000 mg/h | INTRAVENOUS | Status: DC

## 2022-01-29 MED ORDER — LACTATED RINGERS IV SOLN: INTRAVENOUS | Status: DC | PRN

## 2022-01-29 MED ORDER — TENECTEPLASE FOR STROKE
0.2500 mg/kg | PACK | Freq: Once | INTRAVENOUS | Status: AC
  Administered 2022-01-29: 19 mg via INTRAVENOUS
  Filled 2022-01-29: qty 10

## 2022-01-29 MED ORDER — ACETAMINOPHEN 160 MG/5ML PO SOLN: 650.0000 mg | ORAL | Status: DC | PRN

## 2022-01-29 MED ORDER — VERAPAMIL HCL 2.5 MG/ML IV SOLN
INTRAVENOUS | Status: AC
  Filled 2022-01-29: qty 2

## 2022-01-29 MED ORDER — CANGRELOR BOLUS VIA INFUSION
INTRAVENOUS | Status: DC | PRN
  Administered 2022-01-29: 2286 ug via INTRAVENOUS

## 2022-01-29 MED ORDER — IOHEXOL 300 MG/ML  SOLN
100.0000 mL | Freq: Once | INTRAMUSCULAR | Status: AC | PRN
  Administered 2022-01-29: 80 mL via INTRA_ARTERIAL

## 2022-01-29 MED ORDER — CANGRELOR TETRASODIUM 50 MG IV SOLR
INTRAVENOUS | Status: AC
  Filled 2022-01-29: qty 50

## 2022-01-29 MED ORDER — SENNOSIDES-DOCUSATE SODIUM 8.6-50 MG PO TABS: 1.0000 | ORAL_TABLET | Freq: Every evening | ORAL | Status: DC | PRN

## 2022-01-29 MED ORDER — SUCCINYLCHOLINE CHLORIDE 200 MG/10ML IV SOSY
PREFILLED_SYRINGE | INTRAVENOUS | Status: DC | PRN
  Administered 2022-01-29: 100 mg via INTRAVENOUS

## 2022-01-29 MED ORDER — SUGAMMADEX SODIUM 200 MG/2ML IV SOLN
INTRAVENOUS | Status: DC | PRN
  Administered 2022-01-29: 200 mg via INTRAVENOUS

## 2022-01-29 MED ORDER — PHENYLEPHRINE 80 MCG/ML (10ML) SYRINGE FOR IV PUSH (FOR BLOOD PRESSURE SUPPORT)
PREFILLED_SYRINGE | INTRAVENOUS | Status: DC | PRN
  Administered 2022-01-29: 80 ug via INTRAVENOUS

## 2022-01-29 MED ORDER — GLYCOPYRROLATE 0.2 MG/ML IJ SOLN
INTRAMUSCULAR | Status: DC | PRN
  Administered 2022-01-29: .2 mg via INTRAVENOUS
  Administered 2022-01-29: .1 mg via INTRAVENOUS
  Administered 2022-01-29: .2 mg via INTRAVENOUS

## 2022-01-29 MED ORDER — PANTOPRAZOLE SODIUM 40 MG IV SOLR
40.0000 mg | Freq: Every day | INTRAVENOUS | Status: DC
  Administered 2022-01-29 – 2022-01-31 (×3): 40 mg via INTRAVENOUS
  Filled 2022-01-29 (×3): qty 10

## 2022-01-29 MED ORDER — IOHEXOL 350 MG/ML SOLN
65.0000 mL | Freq: Once | INTRAVENOUS | Status: AC | PRN
  Administered 2022-01-29: 65 mL via INTRAVENOUS

## 2022-01-29 MED ORDER — STERILE WATER FOR INJECTION IJ SOLN
INTRAMUSCULAR | Status: AC
  Filled 2022-01-29: qty 10

## 2022-01-29 MED ORDER — CHLORHEXIDINE GLUCONATE CLOTH 2 % EX PADS
6.0000 | MEDICATED_PAD | Freq: Every morning | CUTANEOUS | Status: DC
  Administered 2022-01-30: 6 via TOPICAL

## 2022-01-29 MED ORDER — ACETAMINOPHEN 325 MG PO TABS
650.0000 mg | ORAL_TABLET | ORAL | Status: DC | PRN
  Administered 2022-02-05 – 2022-02-10 (×5): 650 mg via ORAL
  Filled 2022-01-29 (×5): qty 2

## 2022-01-29 NOTE — Code Documentation (Signed)
At 1530 check EDRN noted pt back to [facility], same as initial presentation, with L sensory loss, Lt hemianopia, Rt gaze, head turned to right. ED MD notified, Code IR re paged, Stat CTH obtained to RO hemorrhage. Met Dr Cheral Marker in [place]. No hemorrhage on CTNC. Pt to IR suite by EDRN and SRN. Handoff to [employer], [employer].

## 2022-01-29 NOTE — Sedation Documentation (Signed)
Stroke team brought husband to bay 8 in IR to talk with doctors and patient.

## 2022-01-29 NOTE — Progress Notes (Signed)
PHARMACIST CODE STROKE RESPONSE  Notified to mix TNK at 11:22 by Dr. Cheral Marker TNK preparation completed at 11:25  TNK dose = 19 mg IV over 5 seconds.   Issues/delays encountered (if applicable): Decision for TNK administration was delayed due to need for consent from patient's husband, which was given over the phone once contact was made.   Kaleen Mask 01/29/22 11:10 AM

## 2022-01-29 NOTE — ED Provider Notes (Signed)
[facility] EMERGENCY DEPARTMENT Provider Note   CSN: [id number] Arrival date & time: 01/29/22  1101     History  Chief Complaint  Patient presents with   Code Stroke    Julia Deleon is a 76 y.o. female.  Patient is a 76 year old female with a history of hyperlipidemia, hypothyroidism, hypertension, COPD, GERD and chronic kidney disease who is presenting today with EMS as a code stroke.  Patient is high functioning with no specific deficits.  She drove to see her trainer today and while she was working out approximately at 37 he started noticing her not acting right and then she fell to the floor.  They called 911.  When EMS arrived patient was flaccid on the left side with right eye deviation to the right.  Her vital signs were stable, O2 sat was normal and blood sugar was normal.  In route patient has slowly regained some mild function of her left side.  Upon arrival here patient is complaining of a headache.  She denies any chest pain or shortness of breath.  She denies nausea or vomiting.  She was able to give a history that she drove to the hospital.  The history is provided by the EMS personnel and the patient.       Home Medications Prior to Admission medications   Medication Sig Start Date End Date Taking? Authorizing Provider  acetaminophen (TYLENOL) 650 MG CR tablet Take 650-1,300 mg by mouth every 8 (eight) hours as needed for pain.     [provider]  AMBULATORY NON FORMULARY MEDICATION Progesterone 4% cream Apply 1/2 ml 6 nights on, 1 night off NOTE DOSE CHANGE TO TRY TO WEAN OFF 08/17/79   Copland, Alicia B, PA-C  AMBULATORY NON FORMULARY MEDICATION Estriol vaginal cream 1 mg/gram Insert 0.5 mg 2-3 times weekly 1/91/47   Copland, Alicia B, PA-C  azelastine (ASTELIN) 0.1 % nasal spray Place 1 spray into both nostrils daily. Use in each nostril as directed    [provider]  chlorzoxazone (PARAFON) 500 MG tablet TAKE ONE TABLET FOUR  TIMES DAILY AS NEEDED FOR MUSCLE SPASM 10/09/20   [name], Avanti, MD  colestipol (COLESTID) 1 g tablet Take 2 tablets (2 g total) by mouth daily. 10/30/20 11/29/20  Virgel Manifold, MD  DHEA 10 MG TABS Take by mouth.    [provider]  EPINEPHrine 0.3 mg/0.3 mL IJ SOAJ injection Inject 0.3 mg into the muscle as needed for anaphylaxis. 01/19/19   [provider]  estradiol (ESTRACE) 0.1 MG/GM vaginal cream INSERT 0.5GM VAGINALLY 2-3 TIMES WEEKLY AS NEEDED 01/06/55   Copland, Alicia B, PA-C  fexofenadine (ALLEGRA) 60 MG tablet Take 60 mg by mouth daily.    [provider]  levothyroxine (SYNTHROID) 75 MCG tablet TAKE 1 TABLET BY MOUTH EVERY MORNING BEFORE BREAKFAST 03/21/21   [name], Avanti, MD  losartan (COZAAR) 100 MG tablet TAKE 1 TABLET BY MOUTH ONCE DAILY 06/19/21   [name], Avanti, MD  Lutein 20 MG CAPS Take 1 capsule by mouth daily.    [provider]  triamcinolone (NASACORT ALLERGY 24HR) 55 MCG/ACT AERO nasal inhaler Place 2 sprays daily into the nose. Patient taking differently: Place 2 sprays into the nose daily as needed (allergies). 03/28/17   Guadalupe Maple, MD  Turmeric POWD Take by mouth.    [provider]      Allergies    Colesevelam, Levofloxacin, Amoxicillin, Benazepril, Clavulanic acid, Methylisothiazolinone, Thimerosal (thiomersal), Amoxicillin-pot clavulanate, Latex,  and Simvastatin    Review of Systems   Review of Systems  Physical Exam Updated Vital Signs BP (!) 135/52   Pulse 62   Resp 16   Ht '5\' 4"'$  (1.626 m)   Wt 76.2 kg   LMP  (LMP Unknown)   SpO2 99%   BMI 28.84 kg/m  Physical Exam Vitals and nursing note reviewed.  Constitutional:      General: She is not in acute distress.    Appearance: She is well-developed.  HENT:     Head: Normocephalic and atraumatic.  Eyes:     Pupils: Pupils are equal, round, and reactive to light.  Cardiovascular:     Rate and Rhythm: Normal rate and regular rhythm.     Pulses:  Normal pulses.     Heart sounds: Normal heart sounds. No murmur heard.    No friction rub.  Pulmonary:     Effort: Pulmonary effort is normal.     Breath sounds: Normal breath sounds. No wheezing or rales.  Abdominal:     General: Bowel sounds are normal. There is no distension.     Palpations: Abdomen is soft.     Tenderness: There is no abdominal tenderness. There is no guarding or rebound.  Musculoskeletal:        General: No tenderness. Normal range of motion.     Right lower leg: No edema.     Left lower leg: No edema.     Comments: No edema  Skin:    General: Skin is warm and dry.     Findings: No rash.  Neurological:     Mental Status: She is alert and oriented to person, place, and time.     Cranial Nerves: No cranial nerve deficit.     Comments: Eyes naturally deviating towards the right but will cross midline when speaking, left-sided facial droop, 3 out of 5 strength to the left upper extremity and 4-5 strength to the left lower extremity.  5 out of 5 strength to the right upper and lower extremity.  Left-sided pronator drift noted.  No notable aphasia but does seem slightly confused.  Unable to assess sensation     ED Results / Procedures / Treatments   Labs (all labs ordered are listed, but only abnormal results are displayed) Labs Reviewed  I-STAT CHEM 8, ED - Abnormal; Notable for the following components:      Result Value   Potassium 5.4 (*)    BUN 38 (*)    Creatinine, Ser 1.40 (*)    Calcium, Ion 1.12 (*)    All other components within normal limits  RESP PANEL BY RT-PCR (FLU A&B, COVID) ARPGX2  ETHANOL  PROTIME-INR  APTT  CBC  DIFFERENTIAL  RAPID URINE DRUG SCREEN, HOSP PERFORMED  URINALYSIS, ROUTINE W REFLEX MICROSCOPIC  COMPREHENSIVE METABOLIC PANEL  CBG MONITORING, ED    EKG None  Radiology CT ANGIO HEAD NECK W WO CM (CODE STROKE)  Result Date: 01/29/2022 CLINICAL DATA:  Follow-up stroke EXAM: CT ANGIOGRAPHY HEAD AND NECK TECHNIQUE:  Multidetector CT imaging of the head and neck was performed using the standard protocol during bolus administration of intravenous contrast. Multiplanar CT image reconstructions and MIPs were obtained to evaluate the vascular anatomy. Carotid stenosis measurements (when applicable) are obtained utilizing NASCET criteria, using the distal internal carotid diameter as the denominator. RADIATION DOSE REDUCTION: This exam was performed according to the departmental dose-optimization program which includes automated exposure control, adjustment of the mA and/or kV according  to patient size and/or use of iterative reconstruction technique. CONTRAST:  1m OMNIPAQUE IOHEXOL 350 MG/ML SOLN COMPARISON:  Same day stroke code CT FINDINGS: CT HEAD FINDINGS Brain: Please see same-day stroke code CT brain for findings related to right MCA territory infarct. Vascular: See below Skull: Normal. Negative for fracture or focal lesion. Sinuses/Orbits: Bilateral lens replacements. No significant sinus opacification. No mastoid effusion. Other: None Review of the MIP images confirms the above findings CTA NECK FINDINGS Aortic arch: Standard branching. Imaged portion shows no evidence of aneurysm or dissection. No significant stenosis of the major arch vessel origins. Right carotid system: There is severe stenosis at the origin of the right internal carotid artery (greater than 75%) Left carotid system: There is mild narrowing of the origin of the left internal carotid artery. Vertebral arteries: Left dominant system. The distal V4 segment of the right vertebral artery is noncontrast opacified and may be occluded. Skeleton: Negative. Other neck: Negative. Upper chest: Negative. Review of the MIP images confirms the above findings CTA HEAD FINDINGS Anterior circulation: There is occlusion of the proximal right M1 segment with non opacification of the right M2 and M3 MCA segments. There is decreased arborization in the right MCA territory  compared to the left (series 10, image 18). There is moderate stenosis of the distal cavernous segment of the left ICA. Hazy Posterior circulation: There is severe stenosis at the distal basilar artery (series 11, image 20).Mild narrowing in the P1 and P2 segments of the right PCA. 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 Anatomic variants: None Review of the MIP images confirms the above findings IMPRESSION: 1. Acute occlusion of the proximal right M1 with nonopacification of the right M2/M3 segments and decreased arborization in the right MCA territory, compared to left. 2.  Severe stenosis at the origin of the right ICA (>75%). 3.  High grade narrowing in the distal basilar artery. 4. The distal V4 segment of the right vertebral artery may terminate in a PICA or be occluded; recommend comparison with prior imaging,if available. Critical Value/emergent results were called by telephone at the time of interpretation on 01/29/2022 at 12:00 pm to provider ERIC [facility]a [facility], who verbally acknowledged these results. Electronically Signed   By: HMarin RobertsM.D.   On: 01/29/2022 12:08   CT HEAD CODE STROKE WO CONTRAST  Result Date: 01/29/2022 CLINICAL DATA:  Code stroke.  Diffuse left-sided weakness. EXAM: CT HEAD WITHOUT CONTRAST TECHNIQUE: Contiguous axial images were obtained from the base of the skull through the vertex without intravenous contrast. RADIATION DOSE REDUCTION: This exam was performed according to the departmental dose-optimization program which includes automated exposure control, adjustment of the mA and/or kV according to patient size and/or use of iterative reconstruction technique. COMPARISON:  None Available. FINDINGS: Brain: Loss of gray-white differentiation is present in the posterior right insular cortex and right lentiform nucleus. No acute hemorrhage is present. No other definite cortical loss is present. An age indeterminate white matter infarct is present in  the right corona radiata likely chronic. Mild white matter changes are present throughout. The ventricles are of normal size. No significant extraaxial fluid collection is present. The brainstem and cerebellum are within normal limits. Vascular: Atherosclerotic calcifications are present within the cavernous internal carotid arteries bilaterally. Dense calcifications are present within the distal left vertebral artery. Skull: Calvarium is intact. No focal lytic or blastic lesions are present. No significant extracranial soft tissue lesion is present. Sinuses/Orbits: The paranasal sinuses and mastoid air cells are  clear. Bilateral lens replacements are noted. Globes and orbits are otherwise unremarkable. ASPECTS [facility] Stroke Program Early CT Score) - Ganglionic level infarction (caudate, lentiform nuclei, internal capsule, insula, M1-M3 cortex): 5/7 - Supraganglionic infarction (M4-M6 cortex): 3/3 Total score (0-10 with 10 being normal): 8/10 IMPRESSION: 1. Loss of gray-white differentiation in the posterior right insular cortex and right lentiform nucleus compatible with acute/subacute right MCA infarct. 2. Aspects 8/10 3. No acute hemorrhage. 4. Age indeterminate white matter infarct in the right corona radiata likely chronic. 5. Atherosclerosis. These results were called by telephone at the time of interpretation on 01/29/2022 at 11:23 am to Dr. Kerney Elbe, who verbally acknowledged these results. Electronically Signed   By: San Morelle M.D.   On: 01/29/2022 11:23    Procedures Procedures    Medications Ordered in ED Medications  tenecteplase (TNKASE) injection for Stroke 19 mg (19 mg Intravenous Given 01/29/22 1125)  iohexol (OMNIPAQUE) 350 MG/ML injection 65 mL (65 mLs Intravenous Contrast Given 01/29/22 1132)    ED Course/ Medical Decision Making/ A&P                           Medical Decision Making Amount and/or Complexity of Data Reviewed Independent Historian: EMS External Data  Reviewed: notes. Labs: ordered. Decision-making details documented in ED Course. Radiology: ordered and independent interpretation performed. Decision-making details documented in ED Course.  Risk Decision regarding hospitalization.   Pt with multiple medical problems and comorbidities and presenting today with a complaint that caries a high risk for morbidity and mortality.  Here today as a code stroke.  Patient was last seen normal at 1030 this morning with her trainer when she suddenly developed significant strokelike symptoms when EMS arrived patient had left-sided flaccid paralysis.  She was awake and able to answer some questions.  She is complaining of a headache.  No seizure-like activity.  Patient was normal upon arrival today at the gym.  Patient does have some strength in her left upper extremity now which is improved from what it was when EMS arrived.  She takes known anticoagulation.  No prior history of strokes.  Patient went immediately to the scanner as her airway was intact.  I independently interpreted patient's labs today and Chem-8 showed a creatinine of 1.4 with negative EtOH.  Coags were normal and CBC within normal limits.  I have independently visualized and interpreted pt's images today.  Head CT negative for intracranial hemorrhage however on the CT CTA of the head showed acute occlusion of the proximal right M1 with nonopacification of the right M2 and M3 segments and decreased arborization in the right MCA territory compared to the left.  She has severe stenosis at the origin of the right ICA greater than 75% and high-grade narrowing in the distal basilar artery.  The distal V4 segment of the right vertebral artery is also possibly occluded.  Patient went to IR for potential thrombectomy.  Patient hemodynamically stable while in department. CRITICAL CARE Performed by: Aalyah Mansouri Total critical care time: 30 minutes Critical care time was exclusive of separately billable  procedures and treating other patients. Critical care was necessary to treat or prevent imminent or life-threatening deterioration. Critical care was time spent personally by me on the following activities: development of treatment plan with patient and/or surrogate as well as nursing, discussions with consultants, evaluation of patient's response to treatment, examination of patient, obtaining history from patient or surrogate, ordering and performing treatments and  interventions, ordering and review of laboratory studies, ordering and review of radiographic studies, pulse oximetry and re-evaluation of patient's condition.          Final Clinical Impression(s) / ED Diagnoses Final diagnoses:  Acute ischemic stroke [facility])    Rx / DC Orders ED Discharge Orders     None         Blanchie Dessert, MD 01/29/22 1326

## 2022-01-29 NOTE — Progress Notes (Signed)
Report given to 4N RN via telephone and at bedside. Pt transported safely to 4N with this RN and CRNA. Groin level 0 at handoff, VSS.

## 2022-01-29 NOTE — H&P (Signed)
Admission H&P    Chief Complaint: Acute onset of left sided weakness, left facial droop and dysarthria with left sided neglect  HPI: Julia Deleon is an 76 y.o. female with a PMHx of arthritis, CKD, COPD, Fuch's corneal endothelial dystrophy, HLD, HTN and hypothyroidism who presents to the ED via EMS as a Code Stroke with acute onset of left sided weakness, left facial droop and dysarthria with neglect. Symptom onset was at 51 while she was working out. She was standing up performing an exercise when she felt her left leg give way. She collapsed to the floor and EMS was called. On their arrival, the above symptoms were noted. She was emergently transported to the [facility] ED. On arrival she continued to exhibit the above clinical findings. She has no prior history of stroke.   Past Medical History:  Diagnosis Date   Arthritis    Chronic kidney disease    COPD (chronic obstructive pulmonary disease) ([facility])    pt denies    Dyspnea    Family history of adverse reaction to anesthesia    father was slow to wake up   Fuch's endothelial dystrophy    GERD (gastroesophageal reflux disease)    Hyperlipidemia    Hypertension    Hypothyroidism    Pneumonia    hx of    PONV (postoperative nausea and vomiting)    slow to wake up and PONV    Past Surgical History:  Procedure Laterality Date   ABDOMINAL HYSTERECTOMY     APPENDECTOMY     BACK SURGERY  09/26/2018   L4-5 PLIF by Dr. Arnoldo Morale   CARDIAC CATHETERIZATION     CHOLECYSTECTOMY     COLONOSCOPY WITH PROPOFOL N/A 10/23/2020   Procedure: COLONOSCOPY WITH PROPOFOL;  Surgeon: Virgel Manifold, MD;  Location: [facility] ENDOSCOPY;  Service: Endoscopy;  Laterality: N/A;   ESOPHAGOGASTRODUODENOSCOPY N/A 10/23/2020   Procedure: ESOPHAGOGASTRODUODENOSCOPY (EGD);  Surgeon: Virgel Manifold, MD;  Location: [facility] ENDOSCOPY;  Service: Endoscopy;  Laterality: N/A;   EYE SURGERY     FOOT SURGERY Right    pin removed left   laser vein surgery     NASAL SINUS  SURGERY     RIGHT/LEFT HEART CATH AND CORONARY ANGIOGRAPHY N/A 12/06/2017   Procedure: RIGHT/LEFT HEART CATH AND CORONARY ANGIOGRAPHY;  Surgeon: Yolonda Kida, MD;  Location: [facility];  Service: Cardiovascular;  Laterality: N/A;   ROBOTIC ASSISTED LAPAROSCOPIC SACROCOLPOPEXY Bilateral 07/30/2020   Procedure: XI ROBOTIC ASSISTED LAPAROSCOPIC SACROCOLPOPEXY AND SUPRACERVICAL HYSTERECTOMY WITH BILATERAL SALPINGO OOPHERECTOMY;  Surgeon: Ardis Hughs, MD;  Location: [facility] ORS;  Service: Urology;  Laterality: Bilateral;  REQUESTING 4 HRS   TONSILLECTOMY      Family History  Problem Relation Age of Onset   Cancer Mother        lung   CAD Mother    Stroke Father    Breast cancer Sister        91s   Fibromyalgia Sister    Brain cancer Sister    Heart disease Maternal Grandmother    Cancer Paternal Grandmother        throat   Emphysema Paternal Grandfather    Varicose Veins Son    Breast cancer Other    Social History:  reports that she has never smoked. She has never used smokeless tobacco. She reports that she does not drink alcohol and does not use drugs.  Allergies:  Allergies  Allergen Reactions   Colesevelam Nausea And Vomiting  WELCHOL    Levofloxacin Hives   Amoxicillin    Benazepril Other (See Comments)   Clavulanic Acid Other (See Comments)   Methylisothiazolinone Other (See Comments)    Positive on allergy test   Thimerosal (Thiomersal) Other (See Comments)    Positive on allergy test   Amoxicillin-Pot Clavulanate Nausea Only    Did it involve swelling of the face/tongue/throat, SOB, or low BP? No Did it involve sudden or severe rash/hives, skin peeling, or any reaction on the inside of your mouth or nose? No Did you need to seek medical attention at a hospital or doctor's office? Unknown When did it last happen?      unknown If all above answers are "NO", may proceed with cephalosporin use.    Latex Rash    Family history of latex allergy    Simvastatin Itching    Current Facility-Administered Medications on File Prior to Encounter  Medication Dose Route Frequency Provider Last Rate Last Admin   sodium phosphate (FLEET) 7-19 GM/118ML enema 1 enema  1 enema Rectal Once Ardis Hughs, MD       Current Outpatient Medications on File Prior to Encounter  Medication Sig Dispense Refill   acetaminophen (TYLENOL) 650 MG CR tablet Take 650-1,300 mg by mouth every 8 (eight) hours as needed for pain.      AMBULATORY NON FORMULARY MEDICATION Progesterone 4% cream Apply 1/2 ml 6 nights on, 1 night off NOTE DOSE CHANGE TO TRY TO WEAN OFF 30 mL 5   AMBULATORY NON FORMULARY MEDICATION Estriol vaginal cream 1 mg/gram Insert 0.5 mg 2-3 times weekly 30 g 11   azelastine (ASTELIN) 0.1 % nasal spray Place 1 spray into both nostrils daily. Use in each nostril as directed     chlorzoxazone (PARAFON) 500 MG tablet TAKE ONE TABLET FOUR TIMES DAILY AS NEEDED FOR MUSCLE SPASM 120 tablet 1   colestipol (COLESTID) 1 g tablet Take 2 tablets (2 g total) by mouth daily. 60 tablet 0   DHEA 10 MG TABS Take by mouth.     EPINEPHrine 0.3 mg/0.3 mL IJ SOAJ injection Inject 0.3 mg into the muscle as needed for anaphylaxis.     estradiol (ESTRACE) 0.1 MG/GM vaginal cream INSERT 0.5GM VAGINALLY 2-3 TIMES WEEKLY AS NEEDED 42.5 g 1   fexofenadine (ALLEGRA) 60 MG tablet Take 60 mg by mouth daily.     levothyroxine (SYNTHROID) 75 MCG tablet TAKE 1 TABLET BY MOUTH EVERY MORNING BEFORE BREAKFAST 90 tablet 2   losartan (COZAAR) 100 MG tablet TAKE 1 TABLET BY MOUTH ONCE DAILY 90 tablet 1   Lutein 20 MG CAPS Take 1 capsule by mouth daily.     magnesium oxide (MAG-OX) 400 MG tablet Take 1 tablet by mouth 2 (two) times daily.     terbinafine (LAMISIL) 250 MG tablet Take 250 mg by mouth daily.     triamcinolone (NASACORT ALLERGY 24HR) 55 MCG/ACT AERO nasal inhaler Place 2 sprays daily into the nose. (Patient taking differently: Place 2 sprays into the nose daily as needed  (allergies).) 1 Inhaler 12   Turmeric POWD Take by mouth.       ROS: Deferred in the context of acuity of presentation and anosognosia.   Physical Examination: Blood pressure (!) 137/51, pulse 62, resp. rate 16, height '5\' 4"'$  (1.626 m), weight 76.2 kg, SpO2 99 %.  HEENT-  [place]/AT Lungs -  Respirations unlabored Extremities - Warm and well perfused  Neurologic Examination: Mental Status: Depressed level of consciousness. Left sided  neglect. Speech is dysarthric and sparse but fluent. Naming intact. Able to follow all right sided commands. Oriented to the day of the week, the year, month, state and "[place]". Cranial Nerves: II:  PERRL. Left visual field cut III,IV, VI: Rightward gaze preference. Tracks past the midline to the left with significant difficulty.  V: Decreased reactivity to eyelid stimulation on left relative to the right VII: Left facial droop VIII: Hearing intact to voice IX,X: Hypophonic speech XI: Head preferentially rotated to the right XII: Slight leftward deviation of tongue with protrusion Motor: RUE and RLE 5/5 LUE 3/5 proximally and distally with incoordinated movements LLE 4/5 Sensory: Intact to FT to LUE and LLE but with increased latencies of responses. Normal sensation on the right. Positive for extinction on the left to DSS.  Deep Tendon Reflexes:  Hypoactive on the left.  Plantars: Right: downgoing   Left: upgoing Cerebellar: No ataxia with FNF on the right. Severe dyssinergia with LUE movement.  Gait: Unable to assess  NIHSS: 12  Results for orders placed or performed during the hospital encounter of 01/29/22 (from the past 48 hour(s))  Resp Panel by RT-PCR (Flu A&B, Covid) Anterior Nasal Swab     Status: None   Collection Time: 01/29/22 11:03 AM   Specimen: Anterior Nasal Swab  Result Value Ref Range   SARS Coronavirus 2 by RT PCR NEGATIVE NEGATIVE    Comment: (NOTE) SARS-CoV-2 target nucleic acids are NOT DETECTED.  The SARS-CoV-2 RNA is  generally detectable in upper respiratory specimens during the acute phase of infection. The lowest concentration of SARS-CoV-2 viral copies this assay can detect is 138 copies/mL. A negative result does not preclude SARS-Cov-2 infection and should not be used as the sole basis for treatment or other patient management decisions. A negative result may occur with  improper specimen collection/handling, submission of specimen other than nasopharyngeal swab, presence of viral mutation(s) within the areas targeted by this assay, and inadequate number of viral copies(<138 copies/mL). A negative result must be combined with clinical observations, patient history, and epidemiological information. The expected result is Negative.  Fact Sheet for Patients:  EntrepreneurPulse.com.au  Fact Sheet for Healthcare Providers:  IncredibleEmployment.be  This test is no t yet approved or cleared by the [place] FDA and  has been authorized for detection and/or diagnosis of SARS-CoV-2 by FDA under an Emergency Use Authorization (EUA). This EUA will remain  in effect (meaning this test can be used) for the duration of the COVID-19 declaration under Section 564(b)(1) of the Act, 21 U.S.C.section 360bbb-3(b)(1), unless the authorization is terminated  or revoked sooner.       Influenza A by PCR NEGATIVE NEGATIVE   Influenza B by PCR NEGATIVE NEGATIVE    Comment: (NOTE) The Xpert Xpress SARS-CoV-2/FLU/RSV plus assay is intended as an aid in the diagnosis of influenza from Nasopharyngeal swab specimens and should not be used as a sole basis for treatment. Nasal washings and aspirates are unacceptable for Xpert Xpress SARS-CoV-2/FLU/RSV testing.  Fact Sheet for Patients: EntrepreneurPulse.com.au  Fact Sheet for Healthcare Providers: IncredibleEmployment.be  This test is not yet approved or cleared by the [place] FDA  and has been authorized for detection and/or diagnosis of SARS-CoV-2 by FDA under an Emergency Use Authorization (EUA). This EUA will remain in effect (meaning this test can be used) for the duration of the COVID-19 declaration under Section 564(b)(1) of the Act, 21 U.S.C. section 360bbb-3(b)(1), unless the authorization is terminated or revoked.  Performed at [facility], [place] [address], [place], [place] [address]   CBG monitoring, ED     Status: None   Collection Time: 01/29/22 11:04 AM  Result Value Ref Range   Glucose-Capillary 96 70 - 99 mg/dL    Comment: Glucose reference range applies only to samples taken after fasting for at least 8 hours.  Ethanol     Status: None   Collection Time: 01/29/22 11:07 AM  Result Value Ref Range   Alcohol, Ethyl (B) <10 <10 mg/dL    Comment: (NOTE) Lowest detectable limit for serum alcohol is 10 mg/dL.  For medical purposes only. Performed at [facility], [place] [address], [place], [place] [address]   Protime-INR     Status: None   Collection Time: 01/29/22 11:07 AM  Result Value Ref Range   Prothrombin Time 13.9 11.4 - 15.2 seconds   INR 1.1 0.8 - 1.2    Comment: (NOTE) INR goal varies based on device and disease states. Performed at [facility], [place] [address] [place], [place] [address]   APTT     Status: None   Collection Time: 01/29/22 11:07 AM  Result Value Ref Range   aPTT 28 24 - 36 seconds    Comment: Performed at [place] [address], [place], [place] [address]  CBC     Status: None   Collection Time: 01/29/22 11:07 AM  Result Value Ref Range   WBC 6.7 4.0 - 10.5 K/uL   RBC 3.89 3.87 - 5.11 MIL/uL   Hemoglobin 12.7 12.0 - 15.0 g/dL   HCT 36.8 36.0 - 46.0 %   MCV 94.6 80.0 - 100.0 fL   MCH 32.6 26.0 - 34.0 pg   MCHC 34.5 30.0 - 36.0 g/dL   RDW 13.0 11.5 - 15.5 %   Platelets 196 150 - 400 K/uL   nRBC 0.0 0.0 - 0.2 %    Comment: Performed at [facility], [place] [address],  [place], [place] [address]  Differential     Status: None   Collection Time: 01/29/22 11:07 AM  Result Value Ref Range   Neutrophils Relative % 58 %   Neutro Abs 3.9 1.7 - 7.7 K/uL   Lymphocytes Relative 31 %   Lymphs Abs 2.0 0.7 - 4.0 K/uL   Monocytes Relative 8 %   Monocytes Absolute 0.6 0.1 - 1.0 K/uL   Eosinophils Relative 2 %   Eosinophils Absolute 0.1 0.0 - 0.5 K/uL   Basophils Relative 0 %   Basophils Absolute 0.0 0.0 - 0.1 K/uL   Immature Granulocytes 1 %   Abs Immature Granulocytes 0.05 0.00 - 0.07 K/uL    Comment: Performed at [facility], [place] [address] [place],  [address]  I-stat chem 8, ED     Status: Abnormal   Collection Time: 01/29/22 11:15 AM  Result Value Ref Range   Sodium 137 135 - 145 mmol/L   Potassium 5.4 (H) 3.5 - 5.1 mmol/L   Chloride 104 98 - 111 mmol/L   BUN 38 (H) 8 - 23 mg/dL   Creatinine, Ser 1.40 (H) 0.44 - 1.00 mg/dL   Glucose, Bld 90 70 - 99 mg/dL    Comment: Glucose reference range applies only to samples taken after fasting for at least 8 hours.   Calcium, Ion 1.12 (L) 1.15 - 1.40 mmol/L   TCO2 27 22 - 32 mmol/L   Hemoglobin 12.6 12.0 - 15.0 g/dL   HCT  37.0 36.0 - 46.0 %   CT ANGIO HEAD NECK W WO CM (CODE STROKE)  Result Date: 01/29/2022 CLINICAL DATA:  Follow-up stroke EXAM: CT ANGIOGRAPHY HEAD AND NECK TECHNIQUE: Multidetector CT imaging of the head and neck was performed using the standard protocol during bolus administration of intravenous contrast. Multiplanar CT image reconstructions and MIPs were obtained to evaluate the vascular anatomy. Carotid stenosis measurements (when applicable) are obtained utilizing NASCET criteria, using the distal internal carotid diameter as the denominator. RADIATION DOSE REDUCTION: This exam was performed according to the departmental dose-optimization program which includes automated exposure control, adjustment of the mA and/or kV according to patient size and/or use of iterative reconstruction  technique. CONTRAST:  81m OMNIPAQUE IOHEXOL 350 MG/ML SOLN COMPARISON:  Same day stroke code CT FINDINGS: CT HEAD FINDINGS Brain: Please see same-day stroke code CT brain for findings related to right MCA territory infarct. Vascular: See below Skull: Normal. Negative for fracture or focal lesion. Sinuses/Orbits: Bilateral lens replacements. No significant sinus opacification. No mastoid effusion. Other: None Review of the MIP images confirms the above findings CTA NECK FINDINGS Aortic arch: Standard branching. Imaged portion shows no evidence of aneurysm or dissection. No significant stenosis of the major arch vessel origins. Right carotid system: There is severe stenosis at the origin of the right internal carotid artery (greater than 75%) Left carotid system: There is mild narrowing of the origin of the left internal carotid artery. Vertebral arteries: Left dominant system. The distal V4 segment of the right vertebral artery is noncontrast opacified and may be occluded. Skeleton: Negative. Other neck: Negative. Upper chest: Negative. Review of the MIP images confirms the above findings CTA HEAD FINDINGS Anterior circulation: There is occlusion of the proximal right M1 segment with non opacification of the right M2 and M3 MCA segments. There is decreased arborization in the right MCA territory compared to the left (series 10, image 18). There is moderate stenosis of the distal cavernous segment of the left ICA. Hazy Posterior circulation: There is severe stenosis at the distal basilar artery (series 11, image 20).Mild narrowing in the P1 and P2 segments of the right PCA. 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 Anatomic variants: None Review of the MIP images confirms the above findings IMPRESSION: 1. Acute occlusion of the proximal right M1 with nonopacification of the right M2/M3 segments and decreased arborization in the right MCA territory, compared to left. 2.  Severe stenosis  at the origin of the right ICA (>75%). 3.  High grade narrowing in the distal basilar artery. 4. The distal V4 segment of the right vertebral artery may terminate in a PICA or be occluded; recommend comparison with prior imaging,if available. Critical Value/emergent results were called by telephone at the time of interpretation on 01/29/2022 at 12:00 pm to provider [facility], who verbally acknowledged these results. Electronically Signed   By: HMarin RobertsM.D.   On: 01/29/2022 12:08   CT HEAD CODE STROKE WO CONTRAST  Result Date: 01/29/2022 CLINICAL DATA:  Code stroke.  Diffuse left-sided weakness. EXAM: CT HEAD WITHOUT CONTRAST TECHNIQUE: Contiguous axial images were obtained from the base of the skull through the vertex without intravenous contrast. RADIATION DOSE REDUCTION: This exam was performed according to the departmental dose-optimization program which includes automated exposure control, adjustment of the mA and/or kV according to patient size and/or use of iterative reconstruction technique. COMPARISON:  None Available. FINDINGS: Brain: Loss of gray-white differentiation is present in the posterior right insular cortex and  right lentiform nucleus. No acute hemorrhage is present. No other definite cortical loss is present. An age indeterminate white matter infarct is present in the right corona radiata likely chronic. Mild white matter changes are present throughout. The ventricles are of normal size. No significant extraaxial fluid collection is present. The brainstem and cerebellum are within normal limits. Vascular: Atherosclerotic calcifications are present within the cavernous internal carotid arteries bilaterally. Dense calcifications are present within the distal left vertebral artery. Skull: Calvarium is intact. No focal lytic or blastic lesions are present. No significant extracranial soft tissue lesion is present. Sinuses/Orbits: The paranasal sinuses and mastoid air cells are clear.  Bilateral lens replacements are noted. Globes and orbits are otherwise unremarkable. ASPECTS [facility] Stroke Program Early CT Score) - Ganglionic level infarction (caudate, lentiform nuclei, internal capsule, insula, M1-M3 cortex): 5/7 - Supraganglionic infarction (M4-M6 cortex): 3/3 Total score (0-10 with 10 being normal): 8/10 IMPRESSION: 1. Loss of gray-white differentiation in the posterior right insular cortex and right lentiform nucleus compatible with acute/subacute right MCA infarct. 2. Aspects 8/10 3. No acute hemorrhage. 4. Age indeterminate white matter infarct in the right corona radiata likely chronic. 5. Atherosclerosis. These results were called by telephone at the time of interpretation on 01/29/2022 at 11:23 am to Dr. Kerney Elbe, who verbally acknowledged these results. Electronically Signed   By: San Morelle M.D.   On: 01/29/2022 11:23     Assessment: 76 year old female presenting with acute onset of left sided weakness, left facial droop and dysarthria with left sided neglect - Exam reveals findings referable to the right MCA territory. NIHSS 12 - CT head: Loss of gray-white differentiation in the posterior right insular cortex and right lentiform nucleus compatible with acute/subacute right MCA infarct. Aspects 8/10. No acute hemorrhage. Age indeterminate white matter infarct in the right corona radiata likely chronic. Atherosclerosis - CTA of head and neck:  Acute occlusion of the proximal right M1 with nonopacification of the right M2/M3 segments and decreased arborization in the right MCA territory, compared to left. Severe stenosis at the origin of the right ICA (>75%). High grade narrowing in the distal basilar artery. The distal V4 segment of the right vertebral artery may terminate in a PICA or be occluded;  - After comprehensive review of possible contraindications, she has no absolute contraindications to TNK administration. Patient is a TNK candidate. Discussed  extensively the risks/benefits of TNK treatment vs. no treatment with the patient's husband, including risks of hemorrhage and death with TNK administration versus worse overall outcomes on average in patients within the IV thrombolytic time window who are not administered TNK. The patient's left hemineglect and anosognosia precludes meaningful medical decision making on her part at this time. Overall benefits of TNK regarding long-term prognosis are felt to outweigh risks. The patient's husband expressed understanding and wish to proceed with TNK. Telephone consent witnessed by Holland Commons, RN.  - The patient was initially deemed to be a thrombectomy candidate but she rapidly improved after TNK with NIHSS decreasing to 5. There was a less favorable benefit to risk profile at that time point and after extensive discussion with patient and her husband, decision was made to obtain STAT MRI/MRA for more information. - STAT MRI brain: Small to medium sized acute/subacute nonhemorrhagic infarct involving the posterior right parietal cortex. Remote lacunar infarct of the lateral left thalamus. Remote lacunar infarct of the right corona radiata. No acute infarct of the insula or right basal ganglia. Mild periventricular T2 hyperintensities bilaterally are  mildly advanced for age. This likely reflects the sequela of chronic microvascular ischemia. - STAT MRA head: Moderate stenosis of the right cavernous ICA. Mild narrowing of the left cavernous ICA. High-grade right M1 segment stenosis with asymmetric attenuation of distal MCA branch vessels. Mild narrowing of the proximal right A1 segment. Mild right P2 segment stenosis. The left vertebral artery is the dominant vessel. - After review of MRI results, which were discussed with Dr. Gaynelle Cage, we are in consensus that risks of thrombectomy outweigh benefits based on rapid improvement in symptoms after TNK.  - Stroke risk factors: CKD, HLD and HTN   Plan: -  Admitting to Neuro ICU.  - Post-TNK order set to include frequent neuro checks and BP management.  - No antiplatelet medications or anticoagulants for at least 24 hours following TNK.  - DVT prophylaxis with SCDs.  - Has a statin allergy.  - Will need to start antiplatelet therapy if follow up CT at 24 hours is negative for hemorrhagic conversion. - TTE.  - PT/OT/Speech.  - NPO until passes swallow evaluation.  - Telemetry monitoring - Fasting lipid panel, HgbA1c - Continue the patient's home synthroid regimen - Monitor her renal function - IV fluids  95 minutes spent in the emergent neurological evaluation and management of this critically ill patient.   Electronically signed: Dr. Kerney Elbe 01/29/2022, 1:16 PM

## 2022-01-29 NOTE — ED Notes (Signed)
This RN was rounding on patient and was about to perform Swallow Screen, when I noticed that the patient's facial droop was returning and she was experiencing left sided weakness and slurred speech. This RN immediately had Dr. Maryan Rued present to bedside to evaluate patient. Code IR was initiated.

## 2022-01-29 NOTE — Transfer of Care (Signed)
Immediate Anesthesia Transfer of Care Note  Patient: Julia Deleon  Procedure(s) Performed: IR WITH ANESTHESIA  Patient Location: PACU  Anesthesia Type:General  Level of Consciousness: awake and alert   Airway & Oxygen Therapy: Patient Spontanous Breathing and Patient connected to nasal cannula oxygen  Post-op Assessment: Report given to RN and Post -op Vital signs reviewed and stable  Post vital signs: Reviewed and stable  Last Vitals:  Vitals Value Taken Time  BP    Temp    Pulse 76 01/29/22 2046  Resp 19 01/29/22 2046  SpO2 100 % 01/29/22 2046  Vitals shown include unvalidated device data.  Last Pain:  Vitals:   01/29/22 1439  TempSrc: Oral  PainSc:          Complications: No notable events documented.

## 2022-01-29 NOTE — Anesthesia Preprocedure Evaluation (Addendum)
Anesthesia Evaluation  Patient identified by MRN, date of birth, ID band Patient confused    Reviewed: Allergy & Precautions, Patient's Chart, lab work & pertinent test resultsPreop documentation limited or incomplete due to emergent nature of procedure.  History of Anesthesia Complications (+) PONV and history of anesthetic complications  Airway Mallampati: II  TM Distance: >3 FB Neck ROM: Full    Dental no notable dental hx.    Pulmonary COPD,    Pulmonary exam normal        Cardiovascular hypertension, + CAD   Rhythm:Regular Rate:Normal     Neuro/Psych  Headaches, CVA (CODE STROKE left weakness, facial droop), Residual Symptoms negative psych ROS   GI/Hepatic Neg liver ROS, GERD  ,  Endo/Other  Hypothyroidism   Renal/GU CRFRenal disease  negative genitourinary   Musculoskeletal  (+) Arthritis , Osteoarthritis,    Abdominal Normal abdominal exam  (+)   Peds  Hematology negative hematology ROS (+)   Anesthesia Other Findings   Reproductive/Obstetrics                            Anesthesia Physical Anesthesia Plan  ASA: 4 and emergent  Anesthesia Plan: General   Post-op Pain Management:    Induction: Intravenous and Rapid sequence  PONV Risk Score and Plan: 4 or greater and Ondansetron, Dexamethasone and Treatment may vary due to age or medical condition  Airway Management Planned: Mask and Oral ETT  Additional Equipment: Arterial line  Intra-op Plan:   Post-operative Plan: Possible Post-op intubation/ventilation  Informed Consent: I have reviewed the patients History and Physical, chart, labs and discussed the procedure including the risks, benefits and alternatives for the proposed anesthesia with the patient or authorized representative who has indicated his/her understanding and acceptance.     Only emergency history available  Plan Discussed with: CRNA  Anesthesia  Plan Comments: (Lab Results      Component                Value               Date                      WBC                      6.7                 01/29/2022                HGB                      12.6                01/29/2022                HCT                      37.0                01/29/2022                MCV                      94.6                01/29/2022  PLT                      196                 01/29/2022           Lab Results      Component                Value               Date                      NA                       137                 01/29/2022                K                        5.4 (H)             01/29/2022                CO2                      21                  04/13/2021                GLUCOSE                  90                  01/29/2022                BUN                      38 (H)              01/29/2022                CREATININE               1.40 (H)            01/29/2022                CALCIUM                  9.3                 04/13/2021                EGFR                     52 (L)              04/13/2021                GFRNONAA                 54 (L)              07/31/2020          )        Anesthesia Quick Evaluation

## 2022-01-29 NOTE — ED Notes (Signed)
Pt to CT, on full cardiac monitor with this RN and Dr. Cheral Marker.

## 2022-01-29 NOTE — ED Notes (Signed)
Pt to IR directly from CT.

## 2022-01-29 NOTE — Sedation Documentation (Signed)
Doctors,stroke team, husband and patient continue to discuss plan of care.

## 2022-01-29 NOTE — Procedures (Signed)
INTERVENTIONAL NEURORADIOLOGY BRIEF POSTPROCEDURE NOTE  DIAGNOSTIC CEREBRAL ANGIOGRAM MECHANICAL THROMBECTOMY INTRACRANIAL STENTING CERVICAL CAROTID STENTING  Attending: Dr. Pedro Earls  Diagnosis: Right MCA occlusion, intracranial and cervical atherosclerotic disease  Access site: RCFA  Access closure: Perclose prostyle  Anesthesia: GETA  Medication used: Cangrelor IV.  Complications: Right CCF.  Estimated blood loss: 100 mL  Specimen: None.  Findings: Occlusion of the right M1/MCA, severe, near occlusive stenosis of the right internal carotid artery at the bifurcation, severe intracranial atherosclerotic disease.  Mechanical thrombectomy performed with direct contact aspiration with recanalization of the M1 segment with residual atherosclerotic stenosis. Occlusion of the M2 superior division noted. Mechanical thrombectomy performed with stent retriever with recanalization  (TICI 2C).   At this point, cervical carotid angioplasty was performed with moderate residual stenosis.  Follow-up angiograms showed reocclusion of the right M2 middle and superior divisions. Mechanical thrombectomy. Mechanical thrombectomy performed with stent retriever with recanalization of the (TICI 2C). Persistent stenosis of the M1 segment seen.   A cangrelor loading dose and drip was initiated. Next, a 2.25 x 12 mm resolute onyx stent was placed in the M1 segment, across the stenosis with complete resolution. Follow-up angiograms showed a CCF. Then, 2 overlapping flow diverters were placed across the fistulous point. However, there was persistent flow to the cavernous sinus.   Finally, a  8-6 x 40 mm XACT carotid stent was placed across the stenosis in the right carotid bifurcation.  Post procedural flat panel CT showed contrast staining in the basal ganglia and right medial frontal cortex as well as trace hyperdensity in the right sylvian fissure.  Patient was extubated and sent to  ICU in stable condition.  PLAN:  - Bed rest x 4 hours  - Continue cangrelor infusion until ASA + brilinta is loaded - Head CT in 6 hours to evaluate for bleed - SBP 120-140 mmHg

## 2022-01-29 NOTE — Code Documentation (Addendum)
Ms Alanta Russ is a 76 yr old woman with a PMH of COPD, CKD, HLD, HTN. She is on no thinners. She was last known well today at the gym at 1020 when she had a sudden onset of left hemiplegia and rt gaze noted by her trainer. EMS was called, and they activated code stroke alert.    Ms Boulier arrived to [facility] at 60. She is neglecting the left, has left sensation loss, rt gaze and Lt hemianopsia. (See documentation for details and times). Initial NIHSS 15. Airway cleared by Dr Maryan Rued. Labs drawn, CBG obtained. Pt to CT with the team.    CT non-contrast neg for acute hemorrhage per Dr Cheral Marker. Order received to give TNK. TNK given at 1125.     CTA performed which was positive for Rt MCA occlusion. Code IR paged out at 1141.     While awaiting IR room, pt had rapid improvement in symptoms. MD Karenann Cai notified and Dr Cheral Marker notified. Pt's husband to bedside.  MRI/MRA obtained to determine status of prior LVO in setting of dramatic pt improvement.    MRI obtained, Pt returned to room 27 where her work-up will continue. Bedside handoff with Isacc RN complete at 1350. She will need q 30 min VS and NIHSS until 1930, then hourly.

## 2022-01-29 NOTE — Progress Notes (Signed)
Spoke w/ Dr. Debbrah Alar pertaining to when to cut Cangrelor gtt off. MD v.o. "to cut this med off 2 hrs after ASA and Brilinta admin", this to take place following pt's repeat head CT to r/o any bleeds.

## 2022-01-29 NOTE — Sedation Documentation (Signed)
Plan of care patient will have a MRI at this time. Stroke team transported patient to MRI and patient's husband directed to waiting room.

## 2022-01-29 NOTE — ED Notes (Signed)
This RN received report From [place], [place].

## 2022-01-29 NOTE — Anesthesia Procedure Notes (Signed)
Procedure Name: Intubation Date/Time: 01/29/2022 3:56 PM  Performed by: Inda Coke, CRNAPre-anesthesia Checklist: Patient identified, Emergency Drugs available, Suction available, Timeout performed and Patient being monitored Patient Re-evaluated:Patient Re-evaluated prior to induction Oxygen Delivery Method: Circle system utilized Preoxygenation: Pre-oxygenation with 100% oxygen Induction Type: IV induction and Rapid sequence Ventilation: Mask ventilation without difficulty Laryngoscope Size: Mac and 3 Grade View: Grade I Tube type: Oral Tube size: 7.5 mm Airway Equipment and Method: Stylet Placement Confirmation: ETT inserted through vocal cords under direct vision, positive ETCO2, CO2 detector and breath sounds checked- equal and bilateral Secured at: 22 cm Tube secured with: Tape Dental Injury: Teeth and Oropharynx as per pre-operative assessment

## 2022-01-29 NOTE — Anesthesia Postprocedure Evaluation (Signed)
Anesthesia Post Note  Patient: Julia Deleon  Procedure(s) Performed: IR WITH ANESTHESIA     Patient location during evaluation: ICU Anesthesia Type: General Level of consciousness: awake Pain management: pain level controlled Vital Signs Assessment: post-procedure vital signs reviewed and stable Respiratory status: spontaneous breathing, nonlabored ventilation, respiratory function stable and patient connected to nasal cannula oxygen Cardiovascular status: blood pressure returned to baseline and stable Postop Assessment: no apparent nausea or vomiting Anesthetic complications: no   No notable events documented.  Last Vitals:  Vitals:   01/29/22 2130 01/29/22 2200  BP: (!) 98/56 (!) 112/48  Pulse: (!) 59 (!) 55  Resp: 13 15  Temp:  37.1 C  SpO2: 100% 100%    Last Pain:  Vitals:   01/29/22 2201  TempSrc:   PainSc: Asleep                 Darald Uzzle P Dejon Lukas

## 2022-01-29 NOTE — ED Notes (Signed)
Dr. Lindzen at bedside. 

## 2022-01-29 NOTE — ED Triage Notes (Addendum)
Pt arrives via EMS. Pt was working out with a Clinical research associate. Pt began experiencing left sided weakness, right sided gaze, and slurred speech. Pt arrives to ED AxOx4. Symptoms are still present. Symptoms started at 1020 this morning.

## 2022-01-29 NOTE — Plan of Care (Signed)
  Problem: Education: Goal: Knowledge of disease or condition will improve Outcome: Progressing Goal: Knowledge of secondary prevention will improve (SELECT ALL) Outcome: Progressing Goal: Knowledge of patient specific risk factors will improve (INDIVIDUALIZE FOR PATIENT) Outcome: Progressing Goal: Individualized Educational Video(s) Outcome: Progressing   Problem: Coping: Goal: Will verbalize positive feelings about self Outcome: Progressing Goal: Will identify appropriate support needs Outcome: Progressing   Problem: Health Behavior/Discharge Planning: Goal: Ability to manage health-related needs will improve Outcome: Progressing

## 2022-01-29 NOTE — ED Notes (Addendum)
Pt to IR straight from CT with this RN, Leafy Ro, RN, and [place], [place]

## 2022-01-29 NOTE — Sedation Documentation (Signed)
Neurologist at bedside reassessing patient with stroke team symptoms improving while waiting for procedure room to be ready with prior patient in room.

## 2022-01-30 ENCOUNTER — Telehealth: Payer: Self-pay | Admitting: Pulmonary Disease

## 2022-01-30 ENCOUNTER — Inpatient Hospital Stay (HOSPITAL_COMMUNITY): Payer: Medicare Other

## 2022-01-30 ENCOUNTER — Inpatient Hospital Stay: Payer: Self-pay

## 2022-01-30 ENCOUNTER — Encounter (HOSPITAL_COMMUNITY): Payer: Self-pay | Admitting: Radiology

## 2022-01-30 DIAGNOSIS — R001 Bradycardia, unspecified: Secondary | ICD-10-CM

## 2022-01-30 DIAGNOSIS — I2510 Atherosclerotic heart disease of native coronary artery without angina pectoris: Secondary | ICD-10-CM

## 2022-01-30 DIAGNOSIS — I2583 Coronary atherosclerosis due to lipid rich plaque: Secondary | ICD-10-CM

## 2022-01-30 DIAGNOSIS — N183 Chronic kidney disease, stage 3 unspecified: Secondary | ICD-10-CM

## 2022-01-30 DIAGNOSIS — I639 Cerebral infarction, unspecified: Secondary | ICD-10-CM | POA: Diagnosis not present

## 2022-01-30 DIAGNOSIS — I959 Hypotension, unspecified: Secondary | ICD-10-CM

## 2022-01-30 LAB — BLOOD GAS, ARTERIAL
Acid-base deficit: 6.1 mmol/L — ABNORMAL HIGH (ref 0.0–2.0)
Bicarbonate: 16 mmol/L — ABNORMAL LOW (ref 20.0–28.0)
Drawn by: 4967
O2 Saturation: 99.3 %
Patient temperature: 37
pCO2 arterial: 22 mmHg — ABNORMAL LOW (ref 32–48)
pH, Arterial: 7.47 — ABNORMAL HIGH (ref 7.35–7.45)
pO2, Arterial: 151 mmHg — ABNORMAL HIGH (ref 83–108)

## 2022-01-30 LAB — BASIC METABOLIC PANEL
Anion gap: 7 (ref 5–15)
BUN: 19 mg/dL (ref 8–23)
CO2: 17 mmol/L — ABNORMAL LOW (ref 22–32)
Calcium: 7.5 mg/dL — ABNORMAL LOW (ref 8.9–10.3)
Chloride: 119 mmol/L — ABNORMAL HIGH (ref 98–111)
Creatinine, Ser: 1.22 mg/dL — ABNORMAL HIGH (ref 0.44–1.00)
GFR, Estimated: 47 mL/min — ABNORMAL LOW (ref >60)
Glucose, Bld: 104 mg/dL — ABNORMAL HIGH (ref 70–99)
Potassium: 3.7 mmol/L (ref 3.5–5.1)
Sodium: 143 mmol/L (ref 135–145)

## 2022-01-30 LAB — CBC
HCT: 31 % — ABNORMAL LOW (ref 36.0–46.0)
Hemoglobin: 10.2 g/dL — ABNORMAL LOW (ref 12.0–15.0)
MCH: 31.8 pg (ref 26.0–34.0)
MCHC: 32.9 g/dL (ref 30.0–36.0)
MCV: 96.6 fL (ref 80.0–100.0)
Platelets: 186 10*3/uL (ref 150–400)
RBC: 3.21 MIL/uL — ABNORMAL LOW (ref 3.87–5.11)
RDW: 13.3 % (ref 11.5–15.5)
WBC: 11.9 10*3/uL — ABNORMAL HIGH (ref 4.0–10.5)
nRBC: 0 % (ref 0.0–0.2)

## 2022-01-30 LAB — COMPREHENSIVE METABOLIC PANEL
ALT: 16 U/L (ref 0–44)
AST: 15 U/L (ref 15–41)
Albumin: 2.9 g/dL — ABNORMAL LOW (ref 3.5–5.0)
Alkaline Phosphatase: 39 U/L (ref 38–126)
Anion gap: 7 (ref 5–15)
BUN: 18 mg/dL (ref 8–23)
CO2: 18 mmol/L — ABNORMAL LOW (ref 22–32)
Calcium: 7.6 mg/dL — ABNORMAL LOW (ref 8.9–10.3)
Chloride: 116 mmol/L — ABNORMAL HIGH (ref 98–111)
Creatinine, Ser: 1.02 mg/dL — ABNORMAL HIGH (ref 0.44–1.00)
GFR, Estimated: 58 mL/min — ABNORMAL LOW (ref >60)
Glucose, Bld: 135 mg/dL — ABNORMAL HIGH (ref 70–99)
Potassium: 3.9 mmol/L (ref 3.5–5.1)
Sodium: 141 mmol/L (ref 135–145)
Total Bilirubin: 0.7 mg/dL (ref 0.3–1.2)
Total Protein: 5 g/dL — ABNORMAL LOW (ref 6.5–8.1)

## 2022-01-30 LAB — TROPONIN I (HIGH SENSITIVITY)
Troponin I (High Sensitivity): 31 ng/L — ABNORMAL HIGH (ref <18)
Troponin I (High Sensitivity): 67 ng/L — ABNORMAL HIGH (ref <18)

## 2022-01-30 LAB — LIPID PANEL
Cholesterol: 169 mg/dL (ref 0–200)
HDL: 25 mg/dL — ABNORMAL LOW (ref >40)
LDL Cholesterol: 125 mg/dL — ABNORMAL HIGH (ref 0–99)
Total CHOL/HDL Ratio: 6.8 RATIO
Triglycerides: 96 mg/dL (ref <150)
VLDL: 19 mg/dL (ref 0–40)

## 2022-01-30 LAB — LACTIC ACID, PLASMA
Lactic Acid, Venous: 1 mmol/L (ref 0.5–1.9)
Lactic Acid, Venous: 0.9 mmol/L (ref 0.5–1.9)

## 2022-01-30 LAB — MAGNESIUM
Magnesium: 2.1 mg/dL (ref 1.7–2.4)
Magnesium: 2.1 mg/dL (ref 1.7–2.4)

## 2022-01-30 LAB — MRSA NEXT GEN BY PCR, NASAL: MRSA by PCR Next Gen: NOT DETECTED

## 2022-01-30 LAB — GLUCOSE, CAPILLARY: Glucose-Capillary: 94 mg/dL (ref 70–99)

## 2022-01-30 LAB — TSH: TSH: 1.434 u[IU]/mL (ref 0.350–4.500)

## 2022-01-30 MED ORDER — SODIUM CHLORIDE 0.9% FLUSH
10.0000 mL | Freq: Two times a day (BID) | INTRAVENOUS | Status: DC
  Administered 2022-01-30 – 2022-02-01 (×5): 10 mL
  Administered 2022-02-02: 20 mL
  Administered 2022-02-02 – 2022-02-16 (×26): 10 mL

## 2022-01-30 MED ORDER — LEVOTHYROXINE SODIUM 75 MCG PO TABS
75.0000 ug | ORAL_TABLET | Freq: Every day | ORAL | Status: DC
  Administered 2022-01-31 – 2022-02-11 (×12): 75 ug
  Filled 2022-01-30 (×12): qty 1

## 2022-01-30 MED ORDER — NOREPINEPHRINE 4 MG/250ML-% IV SOLN
0.0000 ug/min | INTRAVENOUS | Status: DC
  Administered 2022-01-30: 2 ug/min via INTRAVENOUS
  Filled 2022-01-30: qty 250

## 2022-01-30 MED ORDER — ORAL CARE MOUTH RINSE
15.0000 mL | OROMUCOSAL | Status: DC
  Administered 2022-01-30 – 2022-02-16 (×60): 15 mL via OROMUCOSAL

## 2022-01-30 MED ORDER — TICAGRELOR 90 MG PO TABS
90.0000 mg | ORAL_TABLET | Freq: Two times a day (BID) | ORAL | Status: DC
  Administered 2022-01-30 – 2022-02-11 (×24): 90 mg
  Filled 2022-01-30 (×24): qty 1

## 2022-01-30 MED ORDER — ASPIRIN 81 MG PO TBEC: 81.0000 mg | DELAYED_RELEASE_TABLET | Freq: Every day | ORAL | Status: DC

## 2022-01-30 MED ORDER — SODIUM CHLORIDE 0.9% FLUSH: 10.0000 mL | INTRAVENOUS | Status: DC | PRN

## 2022-01-30 MED ORDER — ORAL CARE MOUTH RINSE: 15.0000 mL | OROMUCOSAL | Status: DC | PRN

## 2022-01-30 MED ORDER — ACETAMINOPHEN-CODEINE 300-30 MG PO TABS: 2.0000 | ORAL_TABLET | Freq: Four times a day (QID) | ORAL | Status: DC | PRN

## 2022-01-30 MED ORDER — ACETAMINOPHEN-CODEINE 300-30 MG PO TABS
2.0000 | ORAL_TABLET | Freq: Four times a day (QID) | ORAL | Status: DC | PRN
  Administered 2022-01-31 – 2022-02-06 (×10): 2
  Filled 2022-01-30 (×10): qty 2

## 2022-01-30 MED ORDER — HYDRALAZINE HCL 20 MG/ML IJ SOLN
10.0000 mg | INTRAMUSCULAR | Status: DC | PRN
  Administered 2022-01-30 – 2022-01-31 (×2): 10 mg via INTRAVENOUS
  Filled 2022-01-30 (×2): qty 1

## 2022-01-30 MED ORDER — SODIUM CHLORIDE 0.9 % IV SOLN: 250.0000 mL | INTRAVENOUS | Status: DC

## 2022-01-30 MED ORDER — LEVOTHYROXINE SODIUM 75 MCG PO TABS
75.0000 ug | ORAL_TABLET | Freq: Once | ORAL | Status: AC
  Administered 2022-01-30: 75 ug

## 2022-01-30 MED ORDER — SODIUM CHLORIDE 0.9 % IV SOLN: INTRAVENOUS | Status: DC

## 2022-01-30 MED ORDER — ASPIRIN 81 MG PO CHEW
81.0000 mg | CHEWABLE_TABLET | Freq: Every day | ORAL | Status: DC
  Administered 2022-01-31 – 2022-02-11 (×12): 81 mg
  Filled 2022-01-30 (×12): qty 1

## 2022-01-30 MED ORDER — ATROPINE SULFATE 1 MG/10ML IJ SOSY
PREFILLED_SYRINGE | INTRAMUSCULAR | Status: AC
  Filled 2022-01-30: qty 10

## 2022-01-30 MED ORDER — TICAGRELOR 90 MG PO TABS
180.0000 mg | ORAL_TABLET | Freq: Once | ORAL | Status: AC
  Administered 2022-01-30: 180 mg
  Filled 2022-01-30: qty 2

## 2022-01-30 MED ORDER — HYDROMORPHONE HCL 1 MG/ML IJ SOLN
0.5000 mg | INTRAMUSCULAR | Status: AC | PRN
  Administered 2022-01-30 – 2022-01-31 (×2): 0.5 mg via INTRAVENOUS
  Filled 2022-01-30 (×2): qty 1

## 2022-01-30 MED ORDER — SODIUM CHLORIDE 0.9 % IV BOLUS
500.0000 mL | Freq: Once | INTRAVENOUS | Status: AC
  Administered 2022-01-30: 500 mL via INTRAVENOUS

## 2022-01-30 MED ORDER — SODIUM CHLORIDE 0.9 % IV BOLUS
1000.0000 mL | Freq: Once | INTRAVENOUS | Status: AC
  Administered 2022-01-30: 1000 mL via INTRAVENOUS

## 2022-01-30 MED ORDER — DOPAMINE-DEXTROSE 3.2-5 MG/ML-% IV SOLN
0.0000 ug/kg/min | INTRAVENOUS | Status: DC
  Administered 2022-01-30: 5 ug/kg/min via INTRAVENOUS
  Filled 2022-01-30: qty 250

## 2022-01-30 MED ORDER — ASPIRIN 81 MG PO CHEW
81.0000 mg | CHEWABLE_TABLET | Freq: Once | ORAL | Status: AC
  Administered 2022-01-30: 81 mg
  Filled 2022-01-30: qty 1

## 2022-01-30 NOTE — Progress Notes (Signed)
Pt w/ HR in 40s, Art SBP greater than 140. Pt asymptomatic. Dr. Rory Percy informed and N.O. to treat pt with Hydralazine versus starting Clev.

## 2022-01-30 NOTE — Progress Notes (Signed)
Peripherally Inserted Central Catheter Placement  The IV Nurse has discussed with the patient and/or persons authorized to consent for the patient, the purpose of this procedure and the potential benefits and risks involved with this procedure.  The benefits include less needle sticks, lab draws from the catheter, and the patient may be discharged home with the catheter. Risks include, but not limited to, infection, bleeding, blood clot (thrombus formation), and puncture of an artery; nerve damage and irregular heartbeat and possibility to perform a PICC exchange if needed/ordered by physician.  Alternatives to this procedure were also discussed.  Bard Power PICC patient education guide, fact sheet on infection prevention and patient information card has been provided to patient /or left at bedside.    PICC Placement Documentation  PICC Triple Lumen 09/32/35 Right Basilic 38 cm 0 cm (Active)  Indication for Insertion or Continuance of Line Vasoactive infusions 01/30/22 1140  Exposed Catheter (cm) 0 cm 01/30/22 1140  Site Assessment Clean, Dry, Intact 01/30/22 1140  Lumen #1 Status Saline locked;Flushed;Blood return noted 01/30/22 1140  Lumen #2 Status Saline locked;Flushed;Blood return noted 01/30/22 1140  Lumen #3 Status Saline locked;Flushed;Blood return noted 01/30/22 1140  Dressing Type Transparent;Securing device 01/30/22 1140  Dressing Status Antimicrobial disc in place;Clean, Dry, Intact 01/30/22 1140  Safety Lock Not Applicable 57/32/20 2542  Line Care Connections checked and tightened 01/30/22 1140  Line Adjustment (NICU/IV Team Only) No 01/30/22 1140  Dressing Intervention New dressing 01/30/22 1140  Dressing Change Due 02/06/22 01/30/22 1140       Rosalio Macadamia Chenice 01/30/2022, 11:56 AM

## 2022-01-30 NOTE — Progress Notes (Addendum)
Patient's HR dropped to 28 bpm on levo gtt. CCM Dr Halford Chessman and Dr Leonie Man (stroke) notified.  Atropine at bedside.  Montez Hageman RN

## 2022-01-30 NOTE — Progress Notes (Signed)
Patient more frequently dropping HR to 28 and sustaining for around 10 sec. ART line also flat lines when her HR drops that low. Dr Halford Chessman and Marni Griffon NP notified. Order for dopamine gtt instead of levo.   Will also put zoll pads on patient just in case.  Montez Hageman RN

## 2022-01-30 NOTE — Progress Notes (Signed)
Cangrelor stopped exactly at 0840 per Dr Wendelyn Breslow.  Montez Hageman RN

## 2022-01-30 NOTE — Progress Notes (Signed)
Pt's NG tube advanced 15 cm.

## 2022-01-30 NOTE — Progress Notes (Signed)
Pt's Brilinta and Aspirin admin at 0640. The [place] needs to be turned off at [place]. Will communicate this to the on-coming RN.

## 2022-01-30 NOTE — Progress Notes (Signed)
Pt noted to have positional Art line frequent adjustment. Pt c/o pain and pain med admin. Now pt w/ Art line BP 97/36, MAP 53, asymptomatic Aox3 w/ no neurological change. Dr. Rory Percy informed of pt's BP and a 1L bolus of NS ordered. Will conti to monitor pt.

## 2022-01-30 NOTE — Progress Notes (Signed)
Waiting on final read from MD about pt's NG tube placement.

## 2022-01-30 NOTE — Progress Notes (Signed)
SLP Cancellation Note  Patient Details Name: Julia Deleon MRN: [id number] DOB: 25-Nov-1945   Cancelled treatment:       Reason Eval/Treat Not Completed: Medical issues which prohibited therapy;Other (comment) (Patient with HR dropping into 30's; RN recommending hold off on swallow evaluation until next date.)   Sonia Baller, MA, CCC-SLP Speech Therapy

## 2022-01-30 NOTE — Progress Notes (Signed)
Patient HR now dipping into the 30s. Julia Ores, NP notified.   Montez Hageman RN

## 2022-01-30 NOTE — Progress Notes (Signed)
Pt failed swallow screen d/t being lethargic. Per Dr. Debbrah Alar, V.O. to place NG tube, give 180 mg Brilinta, 81 mg Aspirin per tube". "Turn Cangrelor gtt off 2 hrs after administering meds via tube".

## 2022-01-30 NOTE — Consult Note (Addendum)
NAME:  Julia Deleon, MRN:  [id number], DOB:  August 11, 1945, LOS: 1 ADMISSION DATE:  01/29/2022, CONSULTATION DATE: 9/23 REFERRING MD:  Julia Deleon, CHIEF COMPLAINT: Hypotension and bradycardia  History of Present Illness:  76 year old female patient who was admitted on 9/27 for acute onset left-sided weakness, left facial droop, dysarthria and neglect.  Symptom onset 1020 in the morning while working out.  Code stroke called on arrival to ER.  CT angiogram of head and neck showed acute occlusion of proximal right M1 and nonopacification of right M2/M3, severe stenosis at right ICA. received TNK in the emergency room.  She rapidly improved with NIH scale changing from 12 down to 5.  Follow-up MRI continues to show moderate stenosis of the right ICA narrowing of the left ICA high-grade M1 stenosis of the distal MCA and because of this she went to interventional radiology where she underwent mechanical thrombectomy with direct clot aspiration.  She was extubated in the IR suite and admitted to neuro ICU for further evaluation and postop critical care. Postop day 1 while in the intensive care noted to have asymptomatic bradycardia, initially blood pressure normal, then later in the morning hours on 9/23 having worsening hypotension.  Pulmonary asked to evaluate for ongoing bradycardia and hypotension.  Pertinent  Medical History  CKD, COPD, chronic dyspnea adverse reaction to anesthesia, Fuch's endothelial dystrophy, GERD, hyperlipidemia, hypertension, hypothyroidism, postoperative nausea and vomiting   Significant Hospital Events: Including procedures, antibiotic start and stop dates in addition to other pertinent events   9/22 admitted with acute right MCA territory stroke received TNK and underwent mechanical thrombectomy and intracranial stenting with also stenting of the cervical carotid, continued to have postoperative left-sided hemiparesis, neglect and left facial droop.  Echocardiogram EF 60 to 65% normal  LV function no diastolic dysfunction 0/94 having persistent bradycardia and intermittent hypotension critical care consulted  Interim History / Subjective:  Reports she is fatigued  Objective   Blood pressure (Abnormal) 75/35, pulse (Significant) (Abnormal) 45, temperature 99.5 F (37.5 C), temperature source Axillary, resp. rate 13, height '5\' 4"'$  (1.626 m), weight 76.2 kg, SpO2 98 %.        Intake/Output Summary (Last 24 hours) at 01/30/2022 7096 Last data filed at 01/30/2022 0800 Gross per 24 hour  Intake 6700.07 ml  Output 4735 ml  Net 1965.07 ml   Filed Weights   01/29/22 1100  Weight: 76.2 kg    Examination: General: 76 year old female patient currently resting in bed HENT: Normocephalic atraumatic pupils equal reactive left-sided facial droop Lungs: Clear to auscultation Cardiovascular: Regular irregular with intermittent pauses on telemetry and heart rate as low as high 30s Abdomen: Soft not tender Extremities: Warm dry dependent edema left side Neuro: Awake oriented x3 left-sided hemiparesis, left-sided facial droop, left-sided neglect GU: Clear yellow  Resolved Hospital Problem list     Assessment & Plan:   Acute stroke, right MCA territory.  Now status post TNK and thrombectomy 9/22 with residual left sided weakness and neglect Plan Continue serial neurochecks Aspirin & Brilinta per neuro;  will assess following follow-up MR imaging Follow-up imaging.  Neuro Systolic BP goal [id number]  Intermittent bradycardia and hypotension  History of underlying coronary artery disease EF 60 to 65% with normal LV function and no wall motion abnormality 9/22 Plan Hold Cozaar Ck LA  PICC line if able, as I think she will need prolonged IV access, versus central line Transduce central venous pressure And peripheral norepinephrine for now, titrate to goal systolic blood pressure of  120-140 per neuro IR Twelve-lead EKG, cycle CEs  History of CKD stage III.  Serum creatinine  stable Plan Trend chemistries Renal dose meds Strict intake output  Fluid and electrolyte imbalance: None anion gap metabolic acidosis secondary to hyperchloremia Plan Follow-up chemistry Continuing sodium chloride for now  History of hypothyroidism Plan Synthroid    Best Practice (right click and "Reselect all SmartList Selections" daily)   Diet/type: NPO w/ meds via tube DVT prophylaxis: SCD GI prophylaxis: N/A Lines: N/A Foley:  Yes, and it is still needed Code Status:  full code Last date of multidisciplinary goals of care discussion [pending]  Labs   CBC: Recent Labs  Lab 01/29/22 1107 01/29/22 1115 01/29/22 1915  WBC 6.7  --   --   NEUTROABS 3.9  --   --   HGB 12.7 12.6 10.5*  HCT 36.8 37.0 31.0*  MCV 94.6  --   --   PLT 196  --   --     Basic Metabolic Panel: Recent Labs  Lab 01/29/22 1115 01/29/22 1915 01/29/22 2338  NA 137 140 141  K 5.4* 3.8 3.9  CL 104  --  116*  CO2  --   --  18*  GLUCOSE 90  --  135*  BUN 38*  --  18  CREATININE 1.40*  --  1.02*  CALCIUM  --   --  7.6*   GFR: Estimated Creatinine Clearance: 48.4 mL/min (A) (by C-G formula based on SCr of 1.02 mg/dL (H)). Recent Labs  Lab 01/29/22 1107  WBC 6.7    Liver Function Tests: Recent Labs  Lab 01/29/22 2338  AST 15  ALT 16  ALKPHOS 39  BILITOT 0.7  PROT 5.0*  ALBUMIN 2.9*   No results for input(s): "LIPASE", "AMYLASE" in the last 168 hours. No results for input(s): "AMMONIA" in the last 168 hours.  ABG    Component Value Date/Time   PHART 7.324 (L) 01/29/2022 1915   PCO2ART 38.4 01/29/2022 1915   PO2ART 316 (H) 01/29/2022 1915   HCO3 20.3 01/29/2022 1915   TCO2 22 01/29/2022 1915   ACIDBASEDEF 6.0 (H) 01/29/2022 1915   O2SAT 100 01/29/2022 1915     Coagulation Profile: Recent Labs  Lab 01/29/22 1107  INR 1.1    Cardiac Enzymes: No results for input(s): "CKTOTAL", "CKMB", "CKMBINDEX", "TROPONINI" in the last 168 hours.  HbA1C: HB A1C (BAYER DCA -  WAIVED)  Date/Time Value Ref Range Status  04/13/2021 08:52 AM 4.9 4.8 - 5.6 % Final    Comment:             Prediabetes: 5.7 - 6.4          Diabetes: >6.4          Glycemic control for adults with diabetes: <7.0    Hgb A1c MFr Bld  Date/Time Value Ref Range Status  01/29/2022 11:07 AM 5.4 4.8 - 5.6 % Final    Comment:    (NOTE) Pre diabetes:          5.7%-6.4%  Diabetes:              >6.4%  Glycemic control for   <7.0% adults with diabetes     CBG: Recent Labs  Lab 01/29/22 1104  GLUCAP 96    Review of Systems:   As above  Past Medical History:  She,  has a past medical history of Arthritis, Chronic kidney disease, COPD (chronic obstructive pulmonary disease) ([place]), Dyspnea, Family history of adverse reaction  to anesthesia, Fuch's endothelial dystrophy, GERD (gastroesophageal reflux disease), Hyperlipidemia, Hypertension, Hypothyroidism, Pneumonia, and PONV (postoperative nausea and vomiting).   Surgical History:   Past Surgical History:  Procedure Laterality Date   ABDOMINAL HYSTERECTOMY     APPENDECTOMY     BACK SURGERY  09/26/2018   L4-5 PLIF by Dr. Arnoldo Morale   CARDIAC CATHETERIZATION     CHOLECYSTECTOMY     COLONOSCOPY WITH PROPOFOL N/A 10/23/2020   Procedure: COLONOSCOPY WITH PROPOFOL;  Surgeon: Virgel Manifold, MD;  Location: [facility] ENDOSCOPY;  Service: Endoscopy;  Laterality: N/A;   ESOPHAGOGASTRODUODENOSCOPY N/A 10/23/2020   Procedure: ESOPHAGOGASTRODUODENOSCOPY (EGD);  Surgeon: Virgel Manifold, MD;  Location: [facility] ENDOSCOPY;  Service: Endoscopy;  Laterality: N/A;   EYE SURGERY     FOOT SURGERY Right    pin removed left   laser vein surgery     NASAL SINUS SURGERY     RIGHT/LEFT HEART CATH AND CORONARY ANGIOGRAPHY N/A 12/06/2017   Procedure: RIGHT/LEFT HEART CATH AND CORONARY ANGIOGRAPHY;  Surgeon: Yolonda Kida, MD;  Location: [place] [facility] LAB;  Service: Cardiovascular;  Laterality: N/A;   ROBOTIC ASSISTED LAPAROSCOPIC SACROCOLPOPEXY  Bilateral 07/30/2020   Procedure: XI ROBOTIC ASSISTED LAPAROSCOPIC SACROCOLPOPEXY AND SUPRACERVICAL HYSTERECTOMY WITH BILATERAL SALPINGO OOPHERECTOMY;  Surgeon: Ardis Hughs, MD;  Location: [facility] ORS;  Service: Urology;  Laterality: Bilateral;  REQUESTING 4 HRS   TONSILLECTOMY       Social History:   reports that she has never smoked. She has never used smokeless tobacco. She reports that she does not drink alcohol and does not use drugs.   Family History:  Her family history includes Brain cancer in her sister; Breast cancer in her sister and another family member; CAD in her mother; Cancer in her mother and paternal grandmother; Emphysema in her paternal grandfather; Fibromyalgia in her sister; Heart disease in her maternal grandmother; Stroke in her father; Varicose Veins in her son.   Allergies Allergies  Allergen Reactions   Colesevelam Nausea And Vomiting    WELCHOL    Levofloxacin Hives   Amoxicillin    Benazepril Other (See Comments)   Clavulanic Acid Other (See Comments)   Methylisothiazolinone Other (See Comments)    Positive on allergy test   Thimerosal (Thiomersal) Other (See Comments)    Positive on allergy test   Amoxicillin-Pot Clavulanate Nausea Only    Did it involve swelling of the face/tongue/throat, SOB, or low BP? No Did it involve sudden or severe rash/hives, skin peeling, or any reaction on the inside of your mouth or nose? No Did you need to seek medical attention at a hospital or doctor's office? Unknown When did it last happen?      unknown If all above answers are "NO", may proceed with cephalosporin use.    Latex Rash    Family history of latex allergy   Simvastatin Itching     Home Medications  Prior to Admission medications   Medication Sig Start Date End Date Taking? Authorizing Provider  acetaminophen (TYLENOL) 650 MG CR tablet Take 650-1,300 mg by mouth every 8 (eight) hours as needed for pain.     [provider]  AMBULATORY NON  FORMULARY MEDICATION Progesterone 4% cream Apply 1/2 ml 6 nights on, 1 night off NOTE DOSE CHANGE TO TRY TO WEAN OFF 06/11/52   Copland, Alicia B, PA-C  AMBULATORY NON FORMULARY MEDICATION Estriol vaginal cream 1 mg/gram Insert 0.5 mg 2-3 times weekly 2/70/62   Copland, Alicia B, PA-C  azelastine (ASTELIN) 0.1 %  nasal spray Place 1 spray into both nostrils daily. Use in each nostril as directed    [provider]  chlorzoxazone (PARAFON) 500 MG tablet TAKE ONE TABLET FOUR TIMES DAILY AS NEEDED FOR MUSCLE SPASM 10/09/20   [name], Avanti, MD  colestipol (COLESTID) 1 g tablet Take 2 tablets (2 g total) by mouth daily. 10/30/20 11/29/20  Virgel Manifold, MD  DHEA 10 MG TABS Take by mouth.    [provider]  EPINEPHrine 0.3 mg/0.3 mL IJ SOAJ injection Inject 0.3 mg into the muscle as needed for anaphylaxis. 01/19/19   [provider]  estradiol (ESTRACE) 0.1 MG/GM vaginal cream INSERT 0.5GM VAGINALLY 2-3 TIMES WEEKLY AS NEEDED 1/00/71   Copland, Alicia B, PA-C  fexofenadine (ALLEGRA) 60 MG tablet Take 60 mg by mouth daily.    [provider]  levothyroxine (SYNTHROID) 75 MCG tablet TAKE 1 TABLET BY MOUTH EVERY MORNING BEFORE BREAKFAST 03/21/21   [name], Avanti, MD  losartan (COZAAR) 100 MG tablet TAKE 1 TABLET BY MOUTH ONCE DAILY 06/19/21   [name], Avanti, MD  Lutein 20 MG CAPS Take 1 capsule by mouth daily.    [provider]  magnesium oxide (MAG-OX) 400 MG tablet Take 1 tablet by mouth 2 (two) times daily. 01/21/22   [provider]  terbinafine (LAMISIL) 250 MG tablet Take 250 mg by mouth daily. 12/31/21   [provider]  triamcinolone (NASACORT ALLERGY 24HR) 55 MCG/ACT AERO nasal inhaler Place 2 sprays daily into the nose. Patient taking differently: Place 2 sprays into the nose daily as needed (allergies). 03/28/17   Guadalupe Maple, MD  Turmeric POWD Take by mouth.    [provider]     Critical care time: 35 minutes      Erick Colace ACNP-BC [place] Pager # [phone] OR # [phone] if no answer

## 2022-01-30 NOTE — Progress Notes (Signed)
This RN is concerned because when the patient is on the lowest dose of dopamine (5/hr) her HR still drops to the 30s. I am afraid to titrate up on the dopamine because her SBP is 180 by the ART line which is out of parameters for stroke.  Marni Griffon, NP, Dr Halford Chessman, Dr Leonie Man, Janine Ores NP, and Cristela Felt [facility] all notified of my concern. Was instructed by CCM to decrease dopamine to 2.5/hr and that cardiology has been consulted and to await for further orders.  Montez Hageman RN

## 2022-01-30 NOTE — Progress Notes (Addendum)
STROKE TEAM PROGRESS NOTE   INTERVAL HISTORY Her family is at the bedside.  She is having episodes of bradycardia and her BP has been labile. CCM consulted as well as cardiology. She is currently on dopamine for HR and BP management and still having frequent bradycardic episodes. She remains neurologically unchanged and arouses to voice and is oriented with some moderate dysarthria.  Her husband and son were at the bedside and were updated.   Vitals:   01/30/22 1533 01/30/22 1534 01/30/22 1535 01/30/22 1600  BP: (!) 61/40   (!) 87/38  Pulse: (!) 53 (!) 55 (!) 59 (!) 52  Resp: '15 17 19 15  '$ Temp:      TempSrc:      SpO2: 96% 93% 95% 97%  Weight:      Height:       CBC:  Recent Labs  Lab 01/29/22 1107 01/29/22 1115 01/29/22 1915 01/30/22 0933  WBC 6.7  --   --  11.9*  NEUTROABS 3.9  --   --   --   HGB 12.7   < > 10.5* 10.2*  HCT 36.8   < > 31.0* 31.0*  MCV 94.6  --   --  96.6  PLT 196  --   --  186   < > = values in this interval not displayed.   Basic Metabolic Panel:  Recent Labs  Lab 01/29/22 2338 01/30/22 1000 01/30/22 1105  NA 141 143  --   K 3.9 3.7  --   CL 116* 119*  --   CO2 18* 17*  --   GLUCOSE 135* 104*  --   BUN 18 19  --   CREATININE 1.02* 1.22*  --   CALCIUM 7.6* 7.5*  --   MG  --  2.1 2.1   Lipid Panel:  Recent Labs  Lab 01/30/22 0639  CHOL 169  TRIG 96  HDL 25*  CHOLHDL 6.8  VLDL 19  LDLCALC 125*   HgbA1c:  Recent Labs  Lab 01/29/22 1107  HGBA1C 5.4   Urine Drug Screen:  Recent Labs  Lab 01/29/22 2215  LABOPIA NONE DETECTED  COCAINSCRNUR NONE DETECTED  LABBENZ NONE DETECTED  AMPHETMU NONE DETECTED  THCU NONE DETECTED  LABBARB NONE DETECTED    Alcohol Level  Recent Labs  Lab 01/29/22 1107  [place] <10    IMAGING past 24 hours [place] EKG SITE RITE  Result Date: 01/30/2022 If Site Rite image not attached, placement could not be confirmed due to current cardiac rhythm.  DG Abd Portable 1V  Result Date: 01/30/2022 CLINICAL  DATA:  76 year old female status post nasogastric tube placement. EXAM: PORTABLE ABDOMEN - 1 VIEW COMPARISON:  No priors. FINDINGS: Tip of nasogastric tube is in the proximal stomach with side port just proximal to the gastroesophageal junction. Paucity of bowel gas in the visualized abdomen (abdomen is incompletely imaged). IMPRESSION: 1. Nasogastric tube is in a high position and could be advanced 10-15 cm for more optimal placement. Electronically Signed   By: Vinnie Langton M.D.   On: 01/30/2022 05:35   CT HEAD WO CONTRAST (5MM)  Result Date: 01/30/2022 CLINICAL DATA:  Follow-up examination for stroke. EXAM: CT HEAD WITHOUT CONTRAST TECHNIQUE: Contiguous axial images were obtained from the base of the skull through the vertex without intravenous contrast. RADIATION DOSE REDUCTION: This exam was performed according to the departmental dose-optimization program which includes automated exposure control, adjustment of the mA and/or kV according to patient size and/or use of iterative  reconstruction technique. COMPARISON:  Prior intra procedural CT from 01/29/2022. FINDINGS: Brain: Sequelae of interval stenting at the right M1 segment. Hyperdensity seen within the right sylvian fissure. Parenchymal hypodensity seen involving the parasagittal and anterior right frontal lobe. Findings could reflect contrast staining and/or subarachnoid hemorrhage, and are similar to prior. Few scattered superimposed calcific densities, likely atherosclerotic calcifications. Evidence for underlying evolving right MCA territory infarct, most notable at the right basal ganglia. No significant regional mass effect at this time. No midline shift. No other acute intracranial hemorrhage. No other new large vessel territory infarct. No mass lesion or extra-axial fluid collection. No hydrocephalus. Vascular: Vascular stent in place at the right M1 segment. Additional stent in place at the petrous right ICA, extending into the cavernous  segment. Some residual contrast material seen within the intracranial vasculature. Vascular calcifications present about the skull base. No new worrisome hyperdense vessel. Skull: Scalp soft tissues and calvarium demonstrate no new finding. Sinuses/Orbits: Globes and orbital soft tissues within normal limits. Scattered mucosal thickening noted about the ethmoidal air cells and maxillary sinuses. Paranasal sinuses are otherwise clear. Mastoid air cells remain clear. Other: None. IMPRESSION: 1. Sequelae of interval stenting of the right ICA and MCA. Scattered hyperdensity involving the right anterior and medial frontal region as well as the right sylvian fissure, similar to prior intra procedural CT. While this finding almost certainly reflects a degree of contrast staining, superimposed subarachnoid hemorrhage at the right sylvian fissure not excluded. Attention at follow-up recommended. 2. Underlying evolving right MCA territory infarct. No significant regional mass effect at this time. 3. No other new acute intracranial abnormality. These results were communicated to Dr. Rory Percy at 2:45 am on 01/30/2022 by text page via the [facility] messaging system. Electronically Signed   By: Jeannine Boga M.D.   On: 01/30/2022 02:46    Physical Exam  Constitutional: Appears well-developed and well-nourished elderly Caucasian lady.   Cardiovascular: Normal rate and regular rhythm.  Respiratory: Effort normal, non-labored breathing  Neuro: Mental Status: Patient is drowsy, but, oriented to person, place, month, year, and situation.Patient is able to give a clear and coherent history. Right gaze preference, comes to midline. Follows commands speech is hesitant with some dysarthria but at times gibberish difficult to understand Cranial Nerves: II: Right gaze preference, unable to look to the left III,IV, VI: EOMI without ptosis or diploplia..  Blinks to threat on the right and not on the left. V: Facial sensation is  symmetric to temperature VII: Right facial droop  VIII: Hearing is intact to voice X: voice is hypophonic  XI: Shoulder shrug is symmetric. XII: Tongue protrudes midline without atrophy or fasciculations.  Motor: Tone is normal. Bulk is normal.  Right full strength LLE 2/5 LUE 0/5 Sensory: No sensory deficit noted in any extremity  Cerebellar: FNF and HKS are intact bilaterally   ASSESSMENT/PLAN Julia Deleon is a 76 y.o. female with history of arthritis, CKD, COPD, Fuch's corneal endothelial dystrophy, HLD, HTN and hypothyroidism who presents to the ED via EMS as a Code Stroke with acute onset of left sided weakness, left facial droop and dysarthria with neglect. Initially symptoms improved in IR suite after TNK and she was taken back to her ED room. She then had a change in neurological exam and was reactivated as a Code IR.  Stroke: Right MCA infarct s/p TNK and mechanical thrombectomy with TICI2c revascularization  Etiology:  arherosclerosis and stenosis of intracranial vessels   Code Stroke CT head Loss of gray-white  differentiation in the posterior right insular cortex and right lentiform nucleus compatible with acute/subacute right MCA infarct. Aspects 8/10 CTA head & neck Acute occlusion of the proximal right M1. Severe stenosis at the origin of the right ICA (>75%). Repeat CT Head Sequelae of interval stenting of the right ICA and MCA. Scattered hyper density involving the right anterior and medial frontal region as well as the right sylvian fissure, similar to prior intra procedural CT. While this finding almost certainly reflects a degree of contrast staining, superimposed subarachnoid hemorrhage at the right sylvian fissure not excluded. Underlying evolving right MCA territory infarct. Post IR CT contrast staining in the basal ganglia and right medial frontal cortex as well as trace hyperdensity in the right sylvian fissure. MRI Pre procedure- Acute/subacute nonhemorrhagic  infarct involving the posterior right parietal cortex  MRA  Pre procedure 1. Moderate stenosis of the right cavernous ICA. 2. Mild narrowing of the left cavernous ICA. 3. High-grade right M1 segment stenosis with asymmetric attenuation of distal MCA branch vessels. 4. Mild narrowing of the proximal right A1 segment. 5. Mild right P2 segment stenosis. 6. The left vertebral artery is the dominant vessel. 2D Echo EF 55-60% LDL 125 HgbA1c 5.4 VTE prophylaxis - SCDs    Diet   Diet NPO time specified   No antithrombotic prior to admission, now on aspirin 81 mg daily and Brilinta (ticagrelor) 90 mg bid.  Therapy recommendations:  Pending Disposition:  Pending  Mechanical Thrombectomy with cervical carotid angioplasty and stent placement Mechanical thrombectomy performed with direct contact aspiration with recanalization of the M1 segment with residual atherosclerotic stenosis. Mechanical thrombectomy performed with stent retriever with recanalization of M2  (TICI 2C).  [facility] post procedure- IR following for stabilization so this can be coiled   Hx of hypertension now hypotensive requiring vasopressors Home meds:  Losartan Unstable BP goal 120-140 Now on Dopamine  Norepinephrine on standby  Hyperlipidemia Home meds:  None, LDL 125, goal < 70 add when able to take PO- reports allergy to simvastatin  Dysphagia NG tube in place  SLP following   Other Stroke Risk Factors Advanced Age >/= 17   Other Active Problems CKD Stage 3 Cr and GFR stable  Intermittent bradycardia with hypotension Now on dopamine Cardiology consulted CCM consulted Hold antihypertensive medications PICC line placed for vasopressors  Headache Added tylenol with codeine  COPD Not in acute exacerbation   Hospital day # 1  Patient seen and examined by NP/APP with MD. MD to update note as needed.   Janine Ores, DNP, FNP-BC [facility] Pager: [phone]  STROKE MD NOTE :  I have  personally obtained history,examined this patient, reviewed notes, independently viewed imaging studies, participated in medical decision making and plan of care.ROS completed by me personally and pertinent positives fully documented  I have made any additions or clarifications directly to the above note. Agree with note above.  Patient with multivessel intra and extracranial atherosclerosis underwent thrombectomy requiring angioplasty and stenting at multiple places with cytogenic development of CC fistula.  She will need possible endovascular treatment for CC fistula but presently remains hemodynamically quite unstable.  Plan CCM and cardiology consult.  Hold antihypertensives.  Optimal fluid hydration and low-dose pressor support to maintain perfusion.  Check hemoglobin hematocrit.  Continue aspirin and Brilinta for stents.  Long discussion at bedside with patient's husband and son and answered questions.  Discussed with Dr. Norma Fredrickson and Dr. Halford Chessman This patient is critically ill and at significant risk of neurological worsening,  death and care requires constant monitoring of vital signs, hemodynamics,respiratory and cardiac monitoring, extensive review of multiple databases, frequent neurological assessment, discussion with family, other specialists and medical decision making of high complexity.I have made any additions or clarifications directly to the above note.This critical care time does not reflect procedure time, or teaching time or supervisory time of PA/NP/Med Resident etc but could involve care discussion time.  I spent 30 minutes of neurocritical care time  in the care of  this patient.     Antony Contras, MD Medical Director [facility] Pager: [phone] 01/30/2022 5:57 PM  To contact Stroke Continuity provider, please refer to [email or web address] After hours, contact General Neurology

## 2022-01-30 NOTE — Progress Notes (Signed)
Chief Complaint: Patient was seen today for follow up CVA with intervention   Supervising Physician: Pedro Earls  Patient Status: [facility] - In-pt  Subjective: Rt MCA occlusion as well as near occlusive stenosis of (R)ICA at the bifurcation. S/p angiogram with thrombectomy of M1/M2 and subsequent stent angioplasty. Subsequent finding of [facility] requiring 2 telescoping Pipeline flow diverting stents placed across fistula. Finally (R)ICA stent angioplasty. Pt admitted to Neuro ICU Has had persistent bradycardia and hypotension today. D/w Stroke and PCCM teams   Objective: Physical Exam: BP (!) 62/37 Comment: see ART  Pulse (!) 45   Temp 99.5 F (37.5 C) (Axillary)   Resp 13   Ht '5\' 4"'$  (1.626 m)   Wt 167 lb 15.9 oz (76.2 kg)   LMP  (LMP Unknown)   SpO2 98%   BMI 28.84 kg/m  Resting in bed, tired. A&O x 3 (L)sided droop and hemiparesis (R)groin soft, NT   Current Facility-Administered Medications:    0.9 %  sodium chloride infusion, , Intravenous, Continuous, Kerney Elbe, MD, Last Rate: 75 mL/hr at 01/30/22 1000, Infusion Verify at 01/30/22 1000   0.9 %  sodium chloride infusion, 250 mL, Intravenous, Continuous, Erick Colace, NP, Held at 01/30/22 1037   acetaminophen (TYLENOL) tablet 650 mg, 650 mg, Oral, Q4H PRN **OR** acetaminophen (TYLENOL) 160 MG/5ML solution 650 mg, 650 mg, Per Tube, Q4H PRN, 650 mg at 01/30/22 0640 **OR** acetaminophen (TYLENOL) suppository 650 mg, 650 mg, Rectal, Q4H PRN, de Sindy Messing, Erven Colla, MD, 650 mg at 01/29/22 2121   acetaminophen-codeine (TYLENOL #3) 300-30 MG per tablet 2 tablet, 2 tablet, Per Tube, Q6H PRN, Henri Medal, RPH   atropine 1 MG/10ML injection, , , ,    Chlorhexidine Gluconate Cloth 2 % PADS 6 each, 6 each, Topical, Q0600, Kerney Elbe, MD, 6 each at 01/29/22 2039   Chlorhexidine Gluconate Cloth 2 % PADS 6 each, 6 each, Topical, q morning, Amie Portland, MD, 6 each at 01/30/22 0930   clevidipine  (CLEVIPREX) infusion 0.5 mg/mL, 0-21 mg/hr, Intravenous, Continuous, de Sindy Messing, [place], MD   hydrALAZINE (APRESOLINE) injection 10 mg, 10 mg, Intravenous, Q4H PRN, Amie Portland, MD, 10 mg at 01/30/22 0316   HYDROmorphone (DILAUDID) injection 0.5 mg, 0.5 mg, Intravenous, Q4H PRN, Amie [place], MD, 0.5 mg at 01/30/22 0027   [START ON 01/31/2022] levothyroxine (SYNTHROID) tablet 75 mcg, 75 mcg, Per Tube, Q0600, de Sindy Messing, Erven Colla, MD   norepinephrine (LEVOPHED) '4mg'$  in 242m (0.016 mg/mL) premix infusion, 0-40 mcg/min, Intravenous, Titrated, BHenri Medal RPH, Last Rate: 7.5 mL/hr at 01/30/22 1231, 2 mcg/min at 01/30/22 1231   pantoprazole (PROTONIX) injection 40 mg, 40 mg, Intravenous, QHS, LKerney Elbe MD, 40 mg at 01/29/22 2124   senna-docusate (Senokot-S) tablet 1 tablet, 1 tablet, Oral, QHS PRN, LKerney Elbe MD   sodium chloride 0.9 % bolus 1,000 mL, 1,000 mL, Intravenous, Once, LKerney Elbe MD   sodium chloride flush (NS) 0.9 % injection 10-40 mL, 10-40 mL, Intracatheter, Q12H, SLeonie Man Pramod S, MD, 10 mL at 01/30/22 1232   sodium chloride flush (NS) 0.9 % injection 10-40 mL, 10-40 mL, Intracatheter, PRN, SGarvin Fila MD  Facility-Administered Medications Ordered in Other Encounters:    sodium phosphate (FLEET) 7-19 GM/118ML enema 1 enema, 1 enema, Rectal, Once, HArdis Hughs MD  Labs: CBC Recent Labs    01/29/22 1107 01/29/22 1115 01/29/22 1915 01/30/22 0933  WBC 6.7  --   --  11.9*  HGB 12.7   < >  10.5* 10.2*  HCT 36.8   < > 31.0* 31.0*  PLT 196  --   --  186   < > = values in this interval not displayed.   BMET Recent Labs    01/29/22 2338 01/30/22 1000  NA 141 143  K 3.9 3.7  CL 116* 119*  CO2 18* 17*  GLUCOSE 135* 104*  BUN 18 19  CREATININE 1.02* 1.22*  CALCIUM 7.6* 7.5*   LFT Recent Labs    01/29/22 2338  PROT 5.0*  ALBUMIN 2.9*  AST 15  ALT 16  ALKPHOS 39  BILITOT 0.7   PT/INR Recent Labs    01/29/22 1107   LABPROT 13.9  INR 1.1     Studies/Results: [place] EKG SITE RITE  Result Date: 01/30/2022 If Site Rite image not attached, placement could not be confirmed due to current cardiac rhythm.  DG Abd Portable 1V  Result Date: 01/30/2022 CLINICAL DATA:  76 year old female status post nasogastric tube placement. EXAM: PORTABLE ABDOMEN - 1 VIEW COMPARISON:  No priors. FINDINGS: Tip of nasogastric tube is in the proximal stomach with side port just proximal to the gastroesophageal junction. Paucity of bowel gas in the visualized abdomen (abdomen is incompletely imaged). IMPRESSION: 1. Nasogastric tube is in a high position and could be advanced 10-15 cm for more optimal placement. Electronically Signed   By: Vinnie Langton M.D.   On: 01/30/2022 05:35   CT HEAD WO CONTRAST (5MM)  Result Date: 01/30/2022 CLINICAL DATA:  Follow-up examination for stroke. EXAM: CT HEAD WITHOUT CONTRAST TECHNIQUE: Contiguous axial images were obtained from the base of the skull through the vertex without intravenous contrast. RADIATION DOSE REDUCTION: This exam was performed according to the departmental dose-optimization program which includes automated exposure control, adjustment of the mA and/or kV according to patient size and/or use of iterative reconstruction technique. COMPARISON:  Prior intra procedural CT from 01/29/2022. FINDINGS: Brain: Sequelae of interval stenting at the right M1 segment. Hyperdensity seen within the right sylvian fissure. Parenchymal hypodensity seen involving the parasagittal and anterior right frontal lobe. Findings could reflect contrast staining and/or subarachnoid hemorrhage, and are similar to prior. Few scattered superimposed calcific densities, likely atherosclerotic calcifications. Evidence for underlying evolving right MCA territory infarct, most notable at the right basal ganglia. No significant regional mass effect at this time. No midline shift. No other acute intracranial hemorrhage. No  other new large vessel territory infarct. No mass lesion or extra-axial fluid collection. No hydrocephalus. Vascular: Vascular stent in place at the right M1 segment. Additional stent in place at the petrous right ICA, extending into the cavernous segment. Some residual contrast material seen within the intracranial vasculature. Vascular calcifications present about the skull base. No new worrisome hyperdense vessel. Skull: Scalp soft tissues and calvarium demonstrate no new finding. Sinuses/Orbits: Globes and orbital soft tissues within normal limits. Scattered mucosal thickening noted about the ethmoidal air cells and maxillary sinuses. Paranasal sinuses are otherwise clear. Mastoid air cells remain clear. Other: None. IMPRESSION: 1. Sequelae of interval stenting of the right ICA and MCA. Scattered hyperdensity involving the right anterior and medial frontal region as well as the right sylvian fissure, similar to prior intra procedural CT. While this finding almost certainly reflects a degree of contrast staining, superimposed subarachnoid hemorrhage at the right sylvian fissure not excluded. Attention at follow-up recommended. 2. Underlying evolving right MCA territory infarct. No significant regional mass effect at this time. 3. No other new acute intracranial abnormality. These results were communicated to Dr.  Arora at 2:45 am on 01/30/2022 by text page via the [facility] messaging system. Electronically Signed   By: Jeannine Boga M.D.   On: 01/30/2022 02:46   ECHOCARDIOGRAM COMPLETE  Result Date: 01/29/2022    ECHOCARDIOGRAM REPORT   Patient Name:   Julia Deleon Date of Exam: 01/29/2022 Medical Rec #:  [id number]   Height:       64.0 in Accession #:    [id number]  Weight:       168.0 lb Date of Birth:  02-24-1946    BSA:          1.817 m Patient Age:    77 years    BP:           146/83 mmHg Patient Gender: F           HR:           50 bpm. Exam Location:  Inpatient Procedure: 2D Echo, Cardiac Doppler and  Color Doppler Indications:    Stroke  History:        Patient has no prior history of Echocardiogram examinations.                 COPD; Risk Factors:Dyslipidemia and Hypertension.  Sonographer:    Jefferey Pica Referring Phys: [place]  1. Left ventricular ejection fraction, by estimation, is 60 to 65%. The left ventricle has normal function. The left ventricle has no regional wall motion abnormalities. Left ventricular diastolic parameters were normal.  2. Right ventricular systolic function is normal. The right ventricular size is normal.  3. The mitral valve is normal in structure. No evidence of mitral valve regurgitation. No evidence of mitral stenosis.  4. The aortic valve is normal in structure. Aortic valve regurgitation is mild. Aortic valve sclerosis/calcification is present, without any evidence of aortic stenosis.  5. The inferior vena cava is normal in size with greater than 50% respiratory variability, suggesting right atrial pressure of 3 mmHg. FINDINGS  Left Ventricle: Left ventricular ejection fraction, by estimation, is 60 to 65%. The left ventricle has normal function. The left ventricle has no regional wall motion abnormalities. The left ventricular internal cavity size was normal in size. There is  no left ventricular hypertrophy. Left ventricular diastolic parameters were normal. Right Ventricle: The right ventricular size is normal. No increase in right ventricular wall thickness. Right ventricular systolic function is normal. Left Atrium: Left atrial size was normal in size. Right Atrium: Right atrial size was normal in size. Pericardium: There is no evidence of pericardial effusion. Mitral Valve: The mitral valve is normal in structure. There is mild thickening of the mitral valve leaflet(s). Mild mitral annular calcification. No evidence of mitral valve regurgitation. No evidence of mitral valve stenosis. Tricuspid Valve: The tricuspid valve is normal in structure.  Tricuspid valve regurgitation is trivial. No evidence of tricuspid stenosis. Aortic Valve: The aortic valve is normal in structure. Aortic valve regurgitation is mild. Aortic regurgitation PHT measures 726 msec. Aortic valve sclerosis/calcification is present, without any evidence of aortic stenosis. Aortic valve peak gradient measures 7.7 mmHg. Pulmonic Valve: The pulmonic valve was normal in structure. Pulmonic valve regurgitation is trivial. No evidence of pulmonic stenosis. Aorta: The aortic root is normal in size and structure. Venous: The inferior vena cava is normal in size with greater than 50% respiratory variability, suggesting right atrial pressure of 3 mmHg. IAS/Shunts: No atrial level shunt detected by color flow Doppler.  LEFT VENTRICLE PLAX 2D LVIDd:  4.40 cm   Diastology LVIDs:         2.20 cm   LV e' medial:    6.38 cm/s LV PW:         1.00 cm   LV E/e' medial:  9.9 LV IVS:        1.15 cm   LV e' lateral:   8.63 cm/s LVOT diam:     2.00 cm   LV E/e' lateral: 7.3 LV SV:         81 LV SV Index:   45 LVOT Area:     3.14 cm  RIGHT VENTRICLE             IVC RV Basal diam:  2.60 cm     IVC diam: 1.80 cm RV S prime:     11.70 cm/s TAPSE (M-mode): 2.3 cm LEFT ATRIUM             Index        RIGHT ATRIUM           Index LA diam:        3.90 cm 2.15 cm/m   RA Area:     10.70 cm LA Vol (A2C):   40.7 ml 22.40 ml/m  RA Volume:   20.40 ml  11.23 ml/m LA Vol (A4C):   30.2 ml 16.62 ml/m LA Biplane Vol: 35.2 ml 19.38 ml/m  AORTIC VALVE                 PULMONIC VALVE AV Area (Vmax): 2.71 cm     PV Vmax:       0.74 m/s AV Vmax:        139.00 cm/s  PV Peak grad:  2.2 mmHg AV Peak Grad:   7.7 mmHg LVOT Vmax:      120.00 cm/s LVOT Vmean:     65.100 cm/s LVOT VTI:       0.258 m AI PHT:         726 msec  AORTA Ao Root diam: 3.40 cm Ao Asc diam:  3.70 cm MITRAL VALVE MV Area (PHT): 3.77 cm     SHUNTS MV Decel Time: 201 msec     Systemic VTI:  0.26 m MV E velocity: 62.90 cm/s   Systemic Diam: 2.00 cm MV A  velocity: 100.00 cm/s MV E/A ratio:  0.63 Aditya Sabharwal Electronically signed by Hebert Soho Signature Date/Time: 01/29/2022/4:34:48 PM    Final    CT HEAD WO CONTRAST (5MM)  Result Date: 01/29/2022 CLINICAL DATA:  Altered mental status EXAM: CT HEAD WITHOUT CONTRAST TECHNIQUE: Contiguous axial images were obtained from the base of the skull through the vertex without intravenous contrast. RADIATION DOSE REDUCTION: This exam was performed according to the departmental dose-optimization program which includes automated exposure control, adjustment of the mA and/or kV according to patient size and/or use of iterative reconstruction technique. COMPARISON:  CT head 01/29/2022.  Brain MRI 01/30/2019 FINDINGS: Brain: Redemonstrated are scattered sites of intracranial calcifications, which appear unchanged compared to prior exam, and may be related to a prior infectious or inflammatory process, or sites of chronic hemorrhage. No evidence of acute hemorrhage. No CT evidence of an infarct. Subtle hypodensity seen in the right parietal cortex, in the region of previously noted infarct on prior brain MRI. Vascular: No hyperdense vessel or unexpected calcification. Skull: Normal. Negative for fracture or focal lesion. Sinuses/Orbits: Bilateral lens replacements. Other: None. IMPRESSION: No acute intracranial abnormality. Electronically Signed   By: Marin Roberts  M.D.   On: 01/29/2022 16:10   MR ANGIO HEAD WO CONTRAST  Result Date: 01/29/2022 CLINICAL DATA:  Left-sided weakness.  Abnormal CTA. EXAM: MRA HEAD WITHOUT CONTRAST TECHNIQUE: Angiographic images of the Circle of Willis were acquired using MRA technique without intravenous contrast. COMPARISON:  CTA of the head and neck 01/29/2022 FINDINGS: Anterior circulation: High cervical segments are within normal limits. Atherosclerotic changes are present within the cavernous ICA bilaterally. Moderate stenosis is present on the right. Mild narrowing is present the  left. The ICA termini are within normal limits bilaterally. A high-grade right M1 segment stenosis is present with asymmetric attenuation of distal MCA branch vessels. Mild narrowing is present proximal right A1 segment. ACA branch vessels are within normal limits. Left A1 and M1 segments are within normal limits. MCA bifurcation is normal. Left MCA branch vessels are unremarkable. Posterior circulation: The left vertebral artery is the dominant vessel. The right PICA scratched at the right vertebral artery is centrally terminates at the PICA a with only a very hypoplastic V4 segment. Basilar artery is normal. Both posterior cerebral arteries originate from the basilar tip. Mild right P2 segment stenosis is present. Scattered irregularity is present without other significant stenosis. Anatomic variants: None Other: None. IMPRESSION: 1. Moderate stenosis of the right cavernous ICA. 2. Mild narrowing of the left cavernous ICA. 3. High-grade right M1 segment stenosis with asymmetric attenuation of distal MCA branch vessels. 4. Mild narrowing of the proximal right A1 segment. 5. Mild right P2 segment stenosis. 6. The left vertebral artery is the dominant vessel. Electronically Signed   By: San Morelle M.D.   On: 01/29/2022 13:46   MR BRAIN WO CONTRAST  Result Date: 01/29/2022 CLINICAL DATA:  Acute onset of left-sided weakness and right-sided gaze with slurred speech. Right M1 occlusion. EXAM: MRI HEAD WITHOUT CONTRAST TECHNIQUE: Multiplanar, multiecho pulse sequences of the brain and surrounding structures were obtained without intravenous contrast. COMPARISON:  CT head and CTA head and neck. FINDINGS: Brain: Mild restricted diffusion is present in the posterior right parietal cortex. No acute infarct is present in the insula or basal ganglia. Subtle FLAIR signal abnormalities associated with the area of restricted diffusion. Mild periventricular T2 hyperintensities are present bilaterally. A remote lacunar  infarct is present in the lateral left thalamus. Remote lacunar infarct present in the right corona radiata. The ventricles are of normal size. No significant extraaxial fluid collection is present. The brainstem and cerebellum are within normal limits. The internal auditory canals are within normal limits. Vascular: Normal flow voids are present. Skull and upper cervical spine: The craniocervical junction is normal. Upper cervical spine is within normal limits. Marrow signal is unremarkable. Sinuses/Orbits: The paranasal sinuses and mastoid air cells are clear. Bilateral lens replacements are noted. Globes and orbits are otherwise unremarkable. IMPRESSION: 1. Acute/subacute nonhemorrhagic infarct involving the posterior right parietal cortex. 2. Remote lacunar infarct of the lateral left thalamus. 3. Remote lacunar infarct of the right corona radiata. 4. No acute infarct of the insula or right basal ganglia 5. Mild periventricular T2 hyperintensities bilaterally are mildly advanced for age. This likely reflects the sequela of chronic microvascular ischemia. The above was relayed via text pager to [place] on 01/29/2022 at 13:41 . Electronically Signed   By: San Morelle M.D.   On: 01/29/2022 13:41   CT ANGIO HEAD NECK W WO CM (CODE STROKE)  Result Date: 01/29/2022 CLINICAL DATA:  Follow-up stroke EXAM: CT ANGIOGRAPHY HEAD AND NECK TECHNIQUE: Multidetector CT imaging of the head  and neck was performed using the standard protocol during bolus administration of intravenous contrast. Multiplanar CT image reconstructions and MIPs were obtained to evaluate the vascular anatomy. Carotid stenosis measurements (when applicable) are obtained utilizing NASCET criteria, using the distal internal carotid diameter as the denominator. RADIATION DOSE REDUCTION: This exam was performed according to the departmental dose-optimization program which includes automated exposure control, adjustment of the mA and/or kV  according to patient size and/or use of iterative reconstruction technique. CONTRAST:  41m OMNIPAQUE IOHEXOL 350 MG/ML SOLN COMPARISON:  Same day stroke code CT FINDINGS: CT HEAD FINDINGS Brain: Please see same-day stroke code CT brain for findings related to right MCA territory infarct. Vascular: See below Skull: Normal. Negative for fracture or focal lesion. Sinuses/Orbits: Bilateral lens replacements. No significant sinus opacification. No mastoid effusion. Other: None Review of the MIP images confirms the above findings CTA NECK FINDINGS Aortic arch: Standard branching. Imaged portion shows no evidence of aneurysm or dissection. No significant stenosis of the major arch vessel origins. Right carotid system: There is severe stenosis at the origin of the right internal carotid artery (greater than 75%) Left carotid system: There is mild narrowing of the origin of the left internal carotid artery. Vertebral arteries: Left dominant system. The distal V4 segment of the right vertebral artery is noncontrast opacified and may be occluded. Skeleton: Negative. Other neck: Negative. Upper chest: Negative. Review of the MIP images confirms the above findings CTA HEAD FINDINGS Anterior circulation: There is occlusion of the proximal right M1 segment with non opacification of the right M2 and M3 MCA segments. There is decreased arborization in the right MCA territory compared to the left (series 10, image 18). There is moderate stenosis of the distal cavernous segment of the left ICA. Hazy Posterior circulation: There is severe stenosis at the distal basilar artery (series 11, image 20).Mild narrowing in the P1 and P2 segments of the right PCA. 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 Anatomic variants: None Review of the MIP images confirms the above findings IMPRESSION: 1. Acute occlusion of the proximal right M1 with nonopacification of the right M2/M3 segments and decreased arborization in  the right MCA territory, compared to left. 2.  Severe stenosis at the origin of the right ICA (>75%). 3.  High grade narrowing in the distal basilar artery. 4. The distal V4 segment of the right vertebral artery may terminate in a PICA or be occluded; recommend comparison with prior imaging,if available. Critical Value/emergent results were called by telephone at the time of interpretation on 01/29/2022 at 12:00 pm to provider [facility], who verbally acknowledged these results. Electronically Signed   By: HMarin RobertsM.D.   On: 01/29/2022 12:08   CT HEAD CODE STROKE WO CONTRAST  Result Date: 01/29/2022 CLINICAL DATA:  Code stroke.  Diffuse left-sided weakness. EXAM: CT HEAD WITHOUT CONTRAST TECHNIQUE: Contiguous axial images were obtained from the base of the skull through the vertex without intravenous contrast. RADIATION DOSE REDUCTION: This exam was performed according to the departmental dose-optimization program which includes automated exposure control, adjustment of the mA and/or kV according to patient size and/or use of iterative reconstruction technique. COMPARISON:  None Available. FINDINGS: Brain: Loss of gray-white differentiation is present in the posterior right insular cortex and right lentiform nucleus. No acute hemorrhage is present. No other definite cortical loss is present. An age indeterminate white matter infarct is present in the right corona radiata likely chronic. Mild white matter changes are present throughout. The  ventricles are of normal size. No significant extraaxial fluid collection is present. The brainstem and cerebellum are within normal limits. Vascular: Atherosclerotic calcifications are present within the cavernous internal carotid arteries bilaterally. Dense calcifications are present within the distal left vertebral artery. Skull: Calvarium is intact. No focal lytic or blastic lesions are present. No significant extracranial soft tissue lesion is present.  Sinuses/Orbits: The paranasal sinuses and mastoid air cells are clear. Bilateral lens replacements are noted. Globes and orbits are otherwise unremarkable. ASPECTS [facility] Stroke Program Early CT Score) - Ganglionic level infarction (caudate, lentiform nuclei, internal capsule, insula, M1-M3 cortex): 5/7 - Supraganglionic infarction (M4-M6 cortex): 3/3 Total score (0-10 with 10 being normal): 8/10 IMPRESSION: 1. Loss of gray-white differentiation in the posterior right insular cortex and right lentiform nucleus compatible with acute/subacute right MCA infarct. 2. Aspects 8/10 3. No acute hemorrhage. 4. Age indeterminate white matter infarct in the right corona radiata likely chronic. 5. Atherosclerosis. These results were called by telephone at the time of interpretation on 01/29/2022 at 11:23 am to Dr. Kerney Elbe, who verbally acknowledged these results. Electronically Signed   By: San Morelle M.D.   On: 01/29/2022 11:23    Assessment/Plan: Rt MCA occlusion as well as near occlusive stenosis of (R)ICA at the bifurcation. S/p angiogram with thrombectomy of M1/M2 and subsequent stent angioplasty. Subsequent finding of C[facility] requiring 2 telescoping Pipeline flow diverting stents placed across fistula. Finally (R)ICA stent angioplasty.  Hypotension and bradycardia per primary teams. Will need repeat imaging to assess fistula and may need additional intervention. Cont Brilinta 90 mg bid and ASA 81 mg daily   LOS: 1 day   I spent a total of 20 minutes in face to face in clinical consultation, greater than 50% of which was counseling/coordinating care for CVA post intervention  Ascencion Dike PA-C 01/30/2022 12:52 PM

## 2022-01-30 NOTE — Progress Notes (Addendum)
Verbal order to continue prioritizing patient's SYSTOLIC blood pressure to keep between parameters of 120-140 over heart rate. Current HR 46 on 5 mcg/hr of dopamine but BP within parameters.   Atropine still at bedside and zoll pads on patient.   Montez Hageman RN

## 2022-01-30 NOTE — Progress Notes (Addendum)
Patient diastolic pressure 25 and HR is now pausing. Janine Ores NP notified and CCM consulted.  Because SBP not at goal, levo gtt will be started.   Montez Hageman RN

## 2022-01-30 NOTE — Progress Notes (Signed)
PT Cancellation Note  Patient Details Name: Julia Deleon MRN: [id number] DOB: [birth year]   Cancelled Treatment:    Reason Eval/Treat Not Completed: Medical issues which prohibited therapy Patient currently with bradycardia as low as 28 bpm and hypotension. Will re-attempt once medically ready.   [name] A. Gilford Rile PT, DPT Acute Rehabilitation Services Office [phone]    Linna Hoff 01/30/2022, 12:42 PM

## 2022-01-30 NOTE — Progress Notes (Signed)
Julia Ores, NP (stroke) called because patient's cuff pressure and ART line pressure reading MAP 45-55. 500 cc bolus order given. Patient HR is also in the 40s. As of this time patient is asymptomatic. Will follow up with further orders.  Montez Hageman RN

## 2022-01-30 NOTE — Progress Notes (Addendum)
Was notified by lab that patient's troponin went from 31 to 67. Dr Halford Chessman paged at this time.   Montez Hageman RN

## 2022-01-30 NOTE — Consult Note (Signed)
Cardiology Consultation   Patient ID: Julia Deleon MRN: [id number]; DOB: July 30, 1945  Admit date: 01/29/2022 Date of Consult: 01/30/2022  PCP:  Idelle Crouch, MD   [facility] Cardiologist:  None        Patient Profile:   Julia Deleon is a 76 y.o. female with a hx of COPD, CKD, hypertension, hypothyroidism who presented with acute CVA and is being seen 01/30/2022 for the evaluation of bradycardia at the request of Salvadore Dom, NP.  History of Present Illness:   Julia Deleon is a 76 year old female with the above medical history who presented to ED as acute stroke code on 01/29/2022.  He had acute onset of left-sided weakness and left facial droop at 10:20 AM while she was working out.  In the ED, she was given TNK.  She was found to have right MCA occlusion as well as near occlusive stenosis of right internal carotid artery.  Underwent thrombectomy of M1/M2 and subsequent stent angioplasty, as well as right ICA stent angioplasty.  Today has been having issues with bradycardia and hypotension.  Having sustained bradycardia down to 30s on telemetry, despite Levophed.  She was switched to dopamine with improvement, currently on dopamine 2.5 mcg/kg/min with improvement in heart rate in 50s to 80s.  She denies any chest pain or dyspnea.   Past Medical History:  Diagnosis Date   Arthritis    Chronic kidney disease    COPD (chronic obstructive pulmonary disease) ([place])    pt denies    Dyspnea    Family history of adverse reaction to anesthesia    father was slow to wake up   Fuch's endothelial dystrophy    GERD (gastroesophageal reflux disease)    Hyperlipidemia    Hypertension    Hypothyroidism    Pneumonia    hx of    PONV (postoperative nausea and vomiting)    slow to wake up and PONV    Past Surgical History:  Procedure Laterality Date   ABDOMINAL HYSTERECTOMY     APPENDECTOMY     BACK SURGERY  09/26/2018   L4-5 PLIF by Dr. Arnoldo Morale   CARDIAC  CATHETERIZATION     CHOLECYSTECTOMY     COLONOSCOPY WITH PROPOFOL N/A 10/23/2020   Procedure: COLONOSCOPY WITH PROPOFOL;  Surgeon: Virgel Manifold, MD;  Location: [facility] ENDOSCOPY;  Service: Endoscopy;  Laterality: N/A;   ESOPHAGOGASTRODUODENOSCOPY N/A 10/23/2020   Procedure: ESOPHAGOGASTRODUODENOSCOPY (EGD);  Surgeon: Virgel Manifold, MD;  Location: [facility] ENDOSCOPY;  Service: Endoscopy;  Laterality: N/A;   EYE SURGERY     FOOT SURGERY Right    pin removed left   laser vein surgery     NASAL SINUS SURGERY     RIGHT/LEFT HEART CATH AND CORONARY ANGIOGRAPHY N/A 12/06/2017   Procedure: RIGHT/LEFT HEART CATH AND CORONARY ANGIOGRAPHY;  Surgeon: Yolonda Kida, MD;  Location: [facility];  Service: Cardiovascular;  Laterality: N/A;   ROBOTIC ASSISTED LAPAROSCOPIC SACROCOLPOPEXY Bilateral 07/30/2020   Procedure: XI ROBOTIC ASSISTED LAPAROSCOPIC SACROCOLPOPEXY AND SUPRACERVICAL HYSTERECTOMY WITH BILATERAL SALPINGO OOPHERECTOMY;  Surgeon: Ardis Hughs, MD;  Location: [facility];  Service: Urology;  Laterality: Bilateral;  REQUESTING 4 HRS   TONSILLECTOMY        Inpatient Medications: Scheduled Meds:  [START ON 01/31/2022] aspirin  81 mg Per Tube Daily   atropine       Chlorhexidine Gluconate Cloth  6 each Topical Q0600   Chlorhexidine Gluconate Cloth  6 each Topical q morning   [  START ON 01/31/2022] levothyroxine  75 mcg Per Tube Q0600   mouth rinse  15 mL Mouth Rinse 4 times per day   pantoprazole (PROTONIX) IV  40 mg Intravenous QHS   sodium chloride flush  10-40 mL Intracatheter Q12H   ticagrelor  90 mg Per Tube BID   Continuous Infusions:  sodium chloride Stopped (01/30/22 1037)   clevidipine     DOPamine 2.5 mcg/kg/min (01/30/22 1500)   norepinephrine (LEVOPHED) Adult infusion Stopped (01/30/22 1414)   sodium chloride     PRN Meds: acetaminophen **OR** acetaminophen (TYLENOL) oral liquid 160 mg/5 mL **OR** acetaminophen, acetaminophen-codeine, atropine,  hydrALAZINE, HYDROmorphone (DILAUDID) injection, mouth rinse, senna-docusate, sodium chloride flush  Allergies:    Allergies  Allergen Reactions   Colesevelam Nausea And Vomiting    WELCHOL    Levofloxacin Hives   Amoxicillin    Benazepril Other (See Comments)   Clavulanic Acid Other (See Comments)   Methylisothiazolinone Other (See Comments)    Positive on allergy test   Thimerosal (Thiomersal) Other (See Comments)    Positive on allergy test   Amoxicillin-Pot Clavulanate Nausea Only    Did it involve swelling of the face/tongue/throat, SOB, or low BP? No Did it involve sudden or severe rash/hives, skin peeling, or any reaction on the inside of your mouth or nose? No Did you need to seek medical attention at a hospital or doctor's office? Unknown When did it last happen?      unknown If all above answers are "NO", may proceed with cephalosporin use.    Latex Rash    Family history of latex allergy   Simvastatin Itching    Social History:   Social History   Socioeconomic History   Marital status: Married    Spouse name: Not on file   Number of children: Not on file   Years of education: 4   Highest education level: Bachelor's degree (e.g., BA, AB, BS)  Occupational History   Occupation: retired  Tobacco Use   Smoking status: Never   Smokeless tobacco: Never  Vaping Use   Vaping Use: Never used  Substance and Sexual Activity   Alcohol use: No    Alcohol/week: 0.0 standard drinks of alcohol   Drug use: No   Sexual activity: Not Currently    Birth control/protection: Post-menopausal  Other Topics Concern   Not on file  Social History Narrative   Not on file   Social Determinants of Health   Financial Resource Strain: Low Risk  (04/08/2021)   Overall Financial Resource Strain (CARDIA)    Difficulty of Paying Living Expenses: Not hard at all  Food Insecurity: No Food Insecurity (04/08/2021)   Hunger Vital Sign    Worried About Running Out of Food in the Last  Year: Never true    Ran Out of Food in the Last Year: Never true  Transportation Needs: No Transportation Needs (04/08/2021)   PRAPARE - Hydrologist (Medical): No    Lack of Transportation (Non-Medical): No  Physical Activity: Insufficiently Active (04/08/2021)   Exercise Vital Sign    Days of Exercise per Week: 4 days    Minutes of Exercise per Session: 30 min  Stress: No Stress Concern Present (04/08/2021)   [place]    Feeling of Stress : Not at all  Social Connections: [place] (04/08/2021)   Social Connection and Isolation Panel [NHANES]    Frequency of Communication with Friends and Family:  More than three times a week    Frequency of Social Gatherings with Friends and Family: More than three times a week    Attends Religious Services: More than 4 times per year    Active Member of [employer] or Organizations: Yes    Attends Music therapist: More than 4 times per year    Marital Status: Married  Human resources officer Violence: Not At Risk (04/08/2021)   Humiliation, Afraid, Rape, and Kick questionnaire    Fear of Current or Ex-Partner: No    Emotionally Abused: No    Physically Abused: No    Sexually Abused: No    Family History:    Family History  Problem Relation Age of Onset   Cancer Mother        lung   CAD Mother    Stroke Father    Breast cancer Sister        27s   Fibromyalgia Sister    Brain cancer Sister    Heart disease Maternal Grandmother    Cancer Paternal Grandmother        throat   Emphysema Paternal Grandfather    Varicose Veins Son    Breast cancer Other      ROS:  Please see the history of present illness.   All other ROS reviewed and negative.     Physical Exam/Data:   Vitals:   01/30/22 1200 01/30/22 1300 01/30/22 1400 01/30/22 1500  BP: (!) 62/37 (!) 64/49 (!) 66/44 (!) 64/41  Pulse: (!) 45 62 (!) 40 79  Resp: '13 16 15  '$ (!) 21  Temp:      TempSrc:      SpO2: 98% 99% 99% 95%  Weight:      Height:        Intake/Output Summary (Last 24 hours) at 01/30/2022 1525 Last data filed at 01/30/2022 1500 Gross per 24 hour  Intake 7826.92 ml  Output 4935 ml  Net 2891.92 ml      01/29/2022   11:00 AM 07/06/2021    2:15 PM 04/28/2021    2:07 PM  Last 3 Weights  Weight (lbs) 167 lb 15.9 oz 164 lb 4 oz 165 lb  Weight (kg) 76.2 kg 74.503 kg 74.844 kg     Body mass index is 28.84 kg/m.  General:  ill appearing Neck: no JVD Cardiac:  RRR; no murmur  Lungs:  clear to auscultation bilaterally Abd: soft, nontender Ext: no edema Musculoskeletal:  No deformitie Skin: warm and dry  Neuro:  L sided hemiparesis, oriented x3   EKG:  The EKG was personally reviewed and demonstrates: Sinus bradycardia, rate 53, no ST abnormalities Telemetry:  Telemetry was personally reviewed and demonstrates: Sinus bradycardia with pauses up to 2.4 seconds and sustained bradycardia down to the 30s.  Currently improved with rates 50s to 80s  Relevant CV Studies: Echocardiogram 01/29/22:  1. Left ventricular ejection fraction, by estimation, is 60 to 65%. The  left ventricle has normal function. The left ventricle has no regional  wall motion abnormalities. Left ventricular diastolic parameters were  normal.   2. Right ventricular systolic function is normal. The right ventricular  size is normal.   3. The mitral valve is normal in structure. No evidence of mitral valve  regurgitation. No evidence of mitral stenosis.   4. The aortic valve is normal in structure. Aortic valve regurgitation is  mild. Aortic valve sclerosis/calcification is present, without any  evidence of aortic stenosis.   5. The inferior vena  cava is normal in size with greater than 50%  respiratory variability, suggesting right atrial pressure of 3 mmHg.   Laboratory Data:  High Sensitivity Troponin:   Recent Labs  Lab 01/30/22 1000 01/30/22 1238   TROPONINIHS 31* 67*     Chemistry Recent Labs  Lab 01/29/22 1115 01/29/22 1915 01/29/22 2338 01/30/22 1000 01/30/22 1105  NA 137 140 141 143  --   K 5.4* 3.8 3.9 3.7  --   CL 104  --  116* 119*  --   CO2  --   --  18* 17*  --   GLUCOSE 90  --  135* 104*  --   BUN 38*  --  18 19  --   CREATININE 1.40*  --  1.02* 1.22*  --   CALCIUM  --   --  7.6* 7.5*  --   MG  --   --   --  2.1 2.1  GFRNONAA  --   --  58* 47*  --   ANIONGAP  --   --  7 7  --     Recent Labs  Lab 01/29/22 2338  PROT 5.0*  ALBUMIN 2.9*  AST 15  ALT 16  ALKPHOS 39  BILITOT 0.7   Lipids  Recent Labs  Lab 01/30/22 0639  CHOL 169  TRIG 96  HDL 25*  LDLCALC 125*  CHOLHDL 6.8    Hematology Recent Labs  Lab 01/29/22 1107 01/29/22 1115 01/29/22 1915 01/30/22 0933  WBC 6.7  --   --  11.9*  RBC 3.89  --   --  3.21*  HGB 12.7 12.6 10.5* 10.2*  HCT 36.8 37.0 31.0* 31.0*  MCV 94.6  --   --  96.6  MCH 32.6  --   --  31.8  MCHC 34.5  --   --  32.9  RDW 13.0  --   --  13.3  PLT 196  --   --  186   Thyroid  Recent Labs  Lab 01/30/22 1000  TSH 1.434    BNPNo results for input(s): "BNP", "PROBNP" in the last 168 hours.  DDimer No results for input(s): "DDIMER" in the last 168 hours.   Radiology/Studies:  [place] EKG SITE RITE  Result Date: 01/30/2022 If Site Rite image not attached, placement could not be confirmed due to current cardiac rhythm.  DG Abd Portable 1V  Result Date: 01/30/2022 CLINICAL DATA:  76 year old female status post nasogastric tube placement. EXAM: PORTABLE ABDOMEN - 1 VIEW COMPARISON:  No priors. FINDINGS: Tip of nasogastric tube is in the proximal stomach with side port just proximal to the gastroesophageal junction. Paucity of bowel gas in the visualized abdomen (abdomen is incompletely imaged). IMPRESSION: 1. Nasogastric tube is in a high position and could be advanced 10-15 cm for more optimal placement. Electronically Signed   By: Vinnie Langton M.D.   On: 01/30/2022  05:35   CT HEAD WO CONTRAST (5MM)  Result Date: 01/30/2022 CLINICAL DATA:  Follow-up examination for stroke. EXAM: CT HEAD WITHOUT CONTRAST TECHNIQUE: Contiguous axial images were obtained from the base of the skull through the vertex without intravenous contrast. RADIATION DOSE REDUCTION: This exam was performed according to the departmental dose-optimization program which includes automated exposure control, adjustment of the mA and/or kV according to patient size and/or use of iterative reconstruction technique. COMPARISON:  Prior intra procedural CT from 01/29/2022. FINDINGS: Brain: Sequelae of interval stenting at the right M1 segment. Hyperdensity seen within the right sylvian fissure.  Parenchymal hypodensity seen involving the parasagittal and anterior right frontal lobe. Findings could reflect contrast staining and/or subarachnoid hemorrhage, and are similar to prior. Few scattered superimposed calcific densities, likely atherosclerotic calcifications. Evidence for underlying evolving right MCA territory infarct, most notable at the right basal ganglia. No significant regional mass effect at this time. No midline shift. No other acute intracranial hemorrhage. No other new large vessel territory infarct. No mass lesion or extra-axial fluid collection. No hydrocephalus. Vascular: Vascular stent in place at the right M1 segment. Additional stent in place at the petrous right ICA, extending into the cavernous segment. Some residual contrast material seen within the intracranial vasculature. Vascular calcifications present about the skull base. No new worrisome hyperdense vessel. Skull: Scalp soft tissues and calvarium demonstrate no new finding. Sinuses/Orbits: Globes and orbital soft tissues within normal limits. Scattered mucosal thickening noted about the ethmoidal air cells and maxillary sinuses. Paranasal sinuses are otherwise clear. Mastoid air cells remain clear. Other: None. IMPRESSION: 1. Sequelae  of interval stenting of the right ICA and MCA. Scattered hyperdensity involving the right anterior and medial frontal region as well as the right sylvian fissure, similar to prior intra procedural CT. While this finding almost certainly reflects a degree of contrast staining, superimposed subarachnoid hemorrhage at the right sylvian fissure not excluded. Attention at follow-up recommended. 2. Underlying evolving right MCA territory infarct. No significant regional mass effect at this time. 3. No other new acute intracranial abnormality. These results were communicated to Dr. Rory Percy at 2:45 am on 01/30/2022 by text page via the [facility] messaging system. Electronically Signed   By: Jeannine Boga M.D.   On: 01/30/2022 02:46   ECHOCARDIOGRAM COMPLETE  Result Date: 01/29/2022    ECHOCARDIOGRAM REPORT   Patient Name:   Julia Deleon Date of Exam: 01/29/2022 Medical Rec #:  [id number]   Height:       64.0 in Accession #:    [id number]  Weight:       168.0 lb Date of Birth:  May 04, 1946    BSA:          1.817 m Patient Age:    63 years    BP:           146/83 mmHg Patient Gender: F           HR:           50 bpm. Exam Location:  Inpatient Procedure: 2D Echo, Cardiac Doppler and Color Doppler Indications:    Stroke  History:        Patient has no prior history of Echocardiogram examinations.                 COPD; Risk Factors:Dyslipidemia and Hypertension.  Sonographer:    Jefferey Pica Referring Phys: [place]  1. Left ventricular ejection fraction, by estimation, is 60 to 65%. The left ventricle has normal function. The left ventricle has no regional wall motion abnormalities. Left ventricular diastolic parameters were normal.  2. Right ventricular systolic function is normal. The right ventricular size is normal.  3. The mitral valve is normal in structure. No evidence of mitral valve regurgitation. No evidence of mitral stenosis.  4. The aortic valve is normal in structure. Aortic valve  regurgitation is mild. Aortic valve sclerosis/calcification is present, without any evidence of aortic stenosis.  5. The inferior vena cava is normal in size with greater than 50% respiratory variability, suggesting right atrial pressure of 3 mmHg. FINDINGS  Left Ventricle: Left  ventricular ejection fraction, by estimation, is 60 to 65%. The left ventricle has normal function. The left ventricle has no regional wall motion abnormalities. The left ventricular internal cavity size was normal in size. There is  no left ventricular hypertrophy. Left ventricular diastolic parameters were normal. Right Ventricle: The right ventricular size is normal. No increase in right ventricular wall thickness. Right ventricular systolic function is normal. Left Atrium: Left atrial size was normal in size. Right Atrium: Right atrial size was normal in size. Pericardium: There is no evidence of pericardial effusion. Mitral Valve: The mitral valve is normal in structure. There is mild thickening of the mitral valve leaflet(s). Mild mitral annular calcification. No evidence of mitral valve regurgitation. No evidence of mitral valve stenosis. Tricuspid Valve: The tricuspid valve is normal in structure. Tricuspid valve regurgitation is trivial. No evidence of tricuspid stenosis. Aortic Valve: The aortic valve is normal in structure. Aortic valve regurgitation is mild. Aortic regurgitation PHT measures 726 msec. Aortic valve sclerosis/calcification is present, without any evidence of aortic stenosis. Aortic valve peak gradient measures 7.7 mmHg. Pulmonic Valve: The pulmonic valve was normal in structure. Pulmonic valve regurgitation is trivial. No evidence of pulmonic stenosis. Aorta: The aortic root is normal in size and structure. Venous: The inferior vena cava is normal in size with greater than 50% respiratory variability, suggesting right atrial pressure of 3 mmHg. IAS/Shunts: No atrial level shunt detected by color flow Doppler.  LEFT  VENTRICLE PLAX 2D LVIDd:         4.40 cm   Diastology LVIDs:         2.20 cm   LV e' medial:    6.38 cm/s LV PW:         1.00 cm   LV E/e' medial:  9.9 LV IVS:        1.15 cm   LV e' lateral:   8.63 cm/s LVOT diam:     2.00 cm   LV E/e' lateral: 7.3 LV SV:         81 LV SV Index:   45 LVOT Area:     3.14 cm  RIGHT VENTRICLE             IVC RV Basal diam:  2.60 cm     IVC diam: 1.80 cm RV S prime:     11.70 cm/s TAPSE (M-mode): 2.3 cm LEFT ATRIUM             Index        RIGHT ATRIUM           Index LA diam:        3.90 cm 2.15 cm/m   RA Area:     10.70 cm LA Vol (A2C):   40.7 ml 22.40 ml/m  RA Volume:   20.40 ml  11.23 ml/m LA Vol (A4C):   30.2 ml 16.62 ml/m LA Biplane Vol: 35.2 ml 19.38 ml/m  AORTIC VALVE                 PULMONIC VALVE AV Area (Vmax): 2.71 cm     PV Vmax:       0.74 m/s AV Vmax:        139.00 cm/s  PV Peak grad:  2.2 mmHg AV Peak Grad:   7.7 mmHg LVOT Vmax:      120.00 cm/s LVOT Vmean:     65.100 cm/s LVOT VTI:       0.258 m AI PHT:  726 msec  AORTA Ao Root diam: 3.40 cm Ao Asc diam:  3.70 cm MITRAL VALVE MV Area (PHT): 3.77 cm     SHUNTS MV Decel Time: 201 msec     Systemic VTI:  0.26 m MV E velocity: 62.90 cm/s   Systemic Diam: 2.00 cm MV A velocity: 100.00 cm/s MV E/A ratio:  0.63 Aditya Sabharwal Electronically signed by Hebert Soho Signature Date/Time: 01/29/2022/4:34:48 PM    Final    CT HEAD WO CONTRAST (5MM)  Result Date: 01/29/2022 CLINICAL DATA:  Altered mental status EXAM: CT HEAD WITHOUT CONTRAST TECHNIQUE: Contiguous axial images were obtained from the base of the skull through the vertex without intravenous contrast. RADIATION DOSE REDUCTION: This exam was performed according to the departmental dose-optimization program which includes automated exposure control, adjustment of the mA and/or kV according to patient size and/or use of iterative reconstruction technique. COMPARISON:  CT head 01/29/2022.  Brain MRI 01/30/2019 FINDINGS: Brain: Redemonstrated are  scattered sites of intracranial calcifications, which appear unchanged compared to prior exam, and may be related to a prior infectious or inflammatory process, or sites of chronic hemorrhage. No evidence of acute hemorrhage. No CT evidence of an infarct. Subtle hypodensity seen in the right parietal cortex, in the region of previously noted infarct on prior brain MRI. Vascular: No hyperdense vessel or unexpected calcification. Skull: Normal. Negative for fracture or focal lesion. Sinuses/Orbits: Bilateral lens replacements. Other: None. IMPRESSION: No acute intracranial abnormality. Electronically Signed   By: Marin Roberts M.D.   On: 01/29/2022 16:10   MR ANGIO HEAD WO CONTRAST  Result Date: 01/29/2022 CLINICAL DATA:  Left-sided weakness.  Abnormal CTA. EXAM: MRA HEAD WITHOUT CONTRAST TECHNIQUE: Angiographic images of the Circle of Willis were acquired using MRA technique without intravenous contrast. COMPARISON:  CTA of the head and neck 01/29/2022 FINDINGS: Anterior circulation: High cervical segments are within normal limits. Atherosclerotic changes are present within the cavernous ICA bilaterally. Moderate stenosis is present on the right. Mild narrowing is present the left. The ICA termini are within normal limits bilaterally. A high-grade right M1 segment stenosis is present with asymmetric attenuation of distal MCA branch vessels. Mild narrowing is present proximal right A1 segment. ACA branch vessels are within normal limits. Left A1 and M1 segments are within normal limits. MCA bifurcation is normal. Left MCA branch vessels are unremarkable. Posterior circulation: The left vertebral artery is the dominant vessel. The right PICA scratched at the right vertebral artery is centrally terminates at the PICA a with only a very hypoplastic V4 segment. Basilar artery is normal. Both posterior cerebral arteries originate from the basilar tip. Mild right P2 segment stenosis is present. Scattered irregularity is  present without other significant stenosis. Anatomic variants: None Other: None. IMPRESSION: 1. Moderate stenosis of the right cavernous ICA. 2. Mild narrowing of the left cavernous ICA. 3. High-grade right M1 segment stenosis with asymmetric attenuation of distal MCA branch vessels. 4. Mild narrowing of the proximal right A1 segment. 5. Mild right P2 segment stenosis. 6. The left vertebral artery is the dominant vessel. Electronically Signed   By: San Morelle M.D.   On: 01/29/2022 13:46   MR BRAIN WO CONTRAST  Result Date: 01/29/2022 CLINICAL DATA:  Acute onset of left-sided weakness and right-sided gaze with slurred speech. Right M1 occlusion. EXAM: MRI HEAD WITHOUT CONTRAST TECHNIQUE: Multiplanar, multiecho pulse sequences of the brain and surrounding structures were obtained without intravenous contrast. COMPARISON:  CT head and CTA head and neck. FINDINGS: Brain: Mild restricted  diffusion is present in the posterior right parietal cortex. No acute infarct is present in the insula or basal ganglia. Subtle FLAIR signal abnormalities associated with the area of restricted diffusion. Mild periventricular T2 hyperintensities are present bilaterally. A remote lacunar infarct is present in the lateral left thalamus. Remote lacunar infarct present in the right corona radiata. The ventricles are of normal size. No significant extraaxial fluid collection is present. The brainstem and cerebellum are within normal limits. The internal auditory canals are within normal limits. Vascular: Normal flow voids are present. Skull and upper cervical spine: The craniocervical junction is normal. Upper cervical spine is within normal limits. Marrow signal is unremarkable. Sinuses/Orbits: The paranasal sinuses and mastoid air cells are clear. Bilateral lens replacements are noted. Globes and orbits are otherwise unremarkable. IMPRESSION: 1. Acute/subacute nonhemorrhagic infarct involving the posterior right parietal  cortex. 2. Remote lacunar infarct of the lateral left thalamus. 3. Remote lacunar infarct of the right corona radiata. 4. No acute infarct of the insula or right basal ganglia 5. Mild periventricular T2 hyperintensities bilaterally are mildly advanced for age. This likely reflects the sequela of chronic microvascular ischemia. The above was relayed via text pager to [facility] on 01/29/2022 at 13:41 . Electronically Signed   By: San Morelle M.D.   On: 01/29/2022 13:41   CT ANGIO HEAD NECK W WO CM (CODE STROKE)  Result Date: 01/29/2022 CLINICAL DATA:  Follow-up stroke EXAM: CT ANGIOGRAPHY HEAD AND NECK TECHNIQUE: Multidetector CT imaging of the head and neck was performed using the standard protocol during bolus administration of intravenous contrast. Multiplanar CT image reconstructions and MIPs were obtained to evaluate the vascular anatomy. Carotid stenosis measurements (when applicable) are obtained utilizing NASCET criteria, using the distal internal carotid diameter as the denominator. RADIATION DOSE REDUCTION: This exam was performed according to the departmental dose-optimization program which includes automated exposure control, adjustment of the mA and/or kV according to patient size and/or use of iterative reconstruction technique. CONTRAST:  49m OMNIPAQUE IOHEXOL 350 MG/ML SOLN COMPARISON:  Same day stroke code CT FINDINGS: CT HEAD FINDINGS Brain: Please see same-day stroke code CT brain for findings related to right MCA territory infarct. Vascular: See below Skull: Normal. Negative for fracture or focal lesion. Sinuses/Orbits: Bilateral lens replacements. No significant sinus opacification. No mastoid effusion. Other: None Review of the MIP images confirms the above findings CTA NECK FINDINGS Aortic arch: Standard branching. Imaged portion shows no evidence of aneurysm or dissection. No significant stenosis of the major arch vessel origins. Right carotid system: There is severe stenosis at  the origin of the right internal carotid artery (greater than 75%) Left carotid system: There is mild narrowing of the origin of the left internal carotid artery. Vertebral arteries: Left dominant system. The distal V4 segment of the right vertebral artery is noncontrast opacified and may be occluded. Skeleton: Negative. Other neck: Negative. Upper chest: Negative. Review of the MIP images confirms the above findings CTA HEAD FINDINGS Anterior circulation: There is occlusion of the proximal right M1 segment with non opacification of the right M2 and M3 MCA segments. There is decreased arborization in the right MCA territory compared to the left (series 10, image 18). There is moderate stenosis of the distal cavernous segment of the left ICA. Hazy Posterior circulation: There is severe stenosis at the distal basilar artery (series 11, image 20).Mild narrowing in the P1 and P2 segments of the right PCA. 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 Anatomic variants: None Review  of the MIP images confirms the above findings IMPRESSION: 1. Acute occlusion of the proximal right M1 with nonopacification of the right M2/M3 segments and decreased arborization in the right MCA territory, compared to left. 2.  Severe stenosis at the origin of the right ICA (>75%). 3.  High grade narrowing in the distal basilar artery. 4. The distal V4 segment of the right vertebral artery may terminate in a PICA or be occluded; recommend comparison with prior imaging,if available. Critical Value/emergent results were called by telephone at the time of interpretation on 01/29/2022 at 12:00 pm to provider [facility] , who verbally acknowledged these results. Electronically Signed   By: Marin Roberts M.D.   On: 01/29/2022 12:08   CT HEAD CODE STROKE WO CONTRAST  Result Date: 01/29/2022 CLINICAL DATA:  Code stroke.  Diffuse left-sided weakness. EXAM: CT HEAD WITHOUT CONTRAST TECHNIQUE: Contiguous axial images were  obtained from the base of the skull through the vertex without intravenous contrast. RADIATION DOSE REDUCTION: This exam was performed according to the departmental dose-optimization program which includes automated exposure control, adjustment of the mA and/or kV according to patient size and/or use of iterative reconstruction technique. COMPARISON:  None Available. FINDINGS: Brain: Loss of gray-white differentiation is present in the posterior right insular cortex and right lentiform nucleus. No acute hemorrhage is present. No other definite cortical loss is present. An age indeterminate white matter infarct is present in the right corona radiata likely chronic. Mild white matter changes are present throughout. The ventricles are of normal size. No significant extraaxial fluid collection is present. The brainstem and cerebellum are within normal limits. Vascular: Atherosclerotic calcifications are present within the cavernous internal carotid arteries bilaterally. Dense calcifications are present within the distal left vertebral artery. Skull: Calvarium is intact. No focal lytic or blastic lesions are present. No significant extracranial soft tissue lesion is present. Sinuses/Orbits: The paranasal sinuses and mastoid air cells are clear. Bilateral lens replacements are noted. Globes and orbits are otherwise unremarkable. ASPECTS [facility] Stroke Program Early CT Score) - Ganglionic level infarction (caudate, lentiform nuclei, internal capsule, insula, M1-M3 cortex): 5/7 - Supraganglionic infarction (M4-M6 cortex): 3/3 Total score (0-10 with 10 being normal): 8/10 IMPRESSION: 1. Loss of gray-white differentiation in the posterior right insular cortex and right lentiform nucleus compatible with acute/subacute right MCA infarct. 2. Aspects 8/10 3. No acute hemorrhage. 4. Age indeterminate white matter infarct in the right corona radiata likely chronic. 5. Atherosclerosis. These results were called by telephone at the  time of interpretation on 01/29/2022 at 11:23 am to Dr. Kerney Elbe, who verbally acknowledged these results. Electronically Signed   By: San Morelle M.D.   On: 01/29/2022 11:23     Assessment and Plan:   Bradycardia: Intermittent sinus bradycardia down to 30s and associated with hypotension.  Suspect bradycardia due to carotid stenting versus intracranial pathology.  Has improved since started dopamine.  Currently on dopamine 2.5 mcg/kg/min with improvement in heart rate to 50s to 80s.  Would continue dopamine to support her blood pressure and heart rate as she recovers.  Keep pacer pads on and atropine at bedside.  Echocardiogram shows normal biventricular function, no significant valvular disease.  Acute CVA: Status post TNK and thrombectomy on 9/22.  Left-sided hemiparesis   For questions or updates, please contact [place] Please consult [email or web address] for contact info under    Signed, Donato Heinz, MD  01/30/2022 3:25 PM

## 2022-01-30 NOTE — Progress Notes (Signed)
Per Dr. Rory Percy if pt has SBP 120-140,  ok w/ MAP <65, HR <60 as long as pt is asymptomatic.

## 2022-01-31 DIAGNOSIS — J988 Other specified respiratory disorders: Secondary | ICD-10-CM | POA: Diagnosis not present

## 2022-01-31 DIAGNOSIS — I639 Cerebral infarction, unspecified: Secondary | ICD-10-CM | POA: Diagnosis not present

## 2022-01-31 DIAGNOSIS — R001 Bradycardia, unspecified: Secondary | ICD-10-CM | POA: Diagnosis not present

## 2022-01-31 DIAGNOSIS — I959 Hypotension, unspecified: Secondary | ICD-10-CM | POA: Diagnosis not present

## 2022-01-31 LAB — BASIC METABOLIC PANEL
Anion gap: 5 (ref 5–15)
BUN: 18 mg/dL (ref 8–23)
CO2: 17 mmol/L — ABNORMAL LOW (ref 22–32)
Calcium: 7.8 mg/dL — ABNORMAL LOW (ref 8.9–10.3)
Chloride: 120 mmol/L — ABNORMAL HIGH (ref 98–111)
Creatinine, Ser: 1.13 mg/dL — ABNORMAL HIGH (ref 0.44–1.00)
GFR, Estimated: 51 mL/min — ABNORMAL LOW (ref >60)
Glucose, Bld: 105 mg/dL — ABNORMAL HIGH (ref 70–99)
Potassium: 3.8 mmol/L (ref 3.5–5.1)
Sodium: 142 mmol/L (ref 135–145)

## 2022-01-31 LAB — CBC
HCT: 30.7 % — ABNORMAL LOW (ref 36.0–46.0)
Hemoglobin: 10.3 g/dL — ABNORMAL LOW (ref 12.0–15.0)
MCH: 31.3 pg (ref 26.0–34.0)
MCHC: 33.6 g/dL (ref 30.0–36.0)
MCV: 93.3 fL (ref 80.0–100.0)
Platelets: 171 10*3/uL (ref 150–400)
RBC: 3.29 MIL/uL — ABNORMAL LOW (ref 3.87–5.11)
RDW: 13.7 % (ref 11.5–15.5)
WBC: 9.9 10*3/uL (ref 4.0–10.5)
nRBC: 0 % (ref 0.0–0.2)

## 2022-01-31 LAB — PHOSPHORUS: Phosphorus: 2.1 mg/dL — ABNORMAL LOW (ref 2.5–4.6)

## 2022-01-31 LAB — GLUCOSE, CAPILLARY: Glucose-Capillary: 79 mg/dL (ref 70–99)

## 2022-01-31 LAB — MAGNESIUM: Magnesium: 2.2 mg/dL (ref 1.7–2.4)

## 2022-01-31 MED ORDER — CALCIUM GLUCONATE-NACL 1-0.675 GM/50ML-% IV SOLN
1.0000 g | Freq: Once | INTRAVENOUS | Status: AC
  Administered 2022-01-31: 1000 mg via INTRAVENOUS
  Filled 2022-01-31: qty 50

## 2022-01-31 MED ORDER — TOPIRAMATE 25 MG PO TABS
25.0000 mg | ORAL_TABLET | Freq: Two times a day (BID) | ORAL | Status: DC
  Administered 2022-01-31 – 2022-02-04 (×9): 25 mg
  Filled 2022-01-31 (×9): qty 1

## 2022-01-31 MED ORDER — HEPARIN SODIUM (PORCINE) 5000 UNIT/ML IJ SOLN
5000.0000 [IU] | Freq: Three times a day (TID) | INTRAMUSCULAR | Status: DC
  Administered 2022-01-31 – 2022-02-16 (×49): 5000 [IU] via SUBCUTANEOUS
  Filled 2022-01-31 (×48): qty 1

## 2022-01-31 MED ORDER — POTASSIUM PHOSPHATES 15 MMOLE/5ML IV SOLN
15.0000 mmol | Freq: Once | INTRAVENOUS | Status: AC
  Administered 2022-01-31: 15 mmol via INTRAVENOUS
  Filled 2022-01-31: qty 5

## 2022-01-31 MED ORDER — HYDRALAZINE HCL 20 MG/ML IJ SOLN: 10.0000 mg | INTRAMUSCULAR | Status: DC | PRN

## 2022-01-31 NOTE — Progress Notes (Addendum)
NAME:  Julia Deleon, MRN:  [id number], DOB:  [birth year], LOS: 2 ADMISSION DATE:  01/29/2022, CONSULTATION DATE: 9/23 REFERRING MD:  Leonie Man, CHIEF COMPLAINT: Hypotension and bradycardia  History of Present Illness:  76 year old female patient who was admitted on 9/27 for acute onset left-sided weakness, left facial droop, dysarthria and neglect.  Symptom onset 1020 in the morning while working out.  Code stroke called on arrival to ER.  CT angiogram of head and neck showed acute occlusion of proximal right M1 and nonopacification of right M2/M3, severe stenosis at right ICA. received TNK in the emergency room.  She rapidly improved with NIH scale changing from 12 down to 5.  Follow-up MRI continues to show moderate stenosis of the right ICA narrowing of the left ICA high-grade M1 stenosis of the distal MCA and because of this she went to interventional radiology where she underwent mechanical thrombectomy with direct clot aspiration.  She was extubated in the IR suite and admitted to neuro ICU for further evaluation and postop critical care. Postop day 1 while in the intensive care noted to have asymptomatic bradycardia, initially blood pressure normal, then later in the morning hours on 9/23 having worsening hypotension.  Pulmonary asked to evaluate for ongoing bradycardia and hypotension.  Pertinent  Medical History  CKD, COPD, chronic dyspnea adverse reaction to anesthesia, Fuch's endothelial dystrophy, GERD, hyperlipidemia, hypertension, hypothyroidism, postoperative nausea and vomiting   Significant Hospital Events: Including procedures, antibiotic start and stop dates in addition to other pertinent events   9/22 admitted with acute right MCA territory stroke received TNK and underwent mechanical thrombectomy and intracranial stenting with also stenting of the cervical carotid, continued to have postoperative left-sided hemiparesis, neglect and left facial droop.  Echocardiogram EF 60 to 65% normal  LV function no diastolic dysfunction 6/96 having persistent bradycardia and intermittent hypotension critical care consulted, started initially on norepi, then transitioned to Dopamine. Asked cards to see. Rec obs/right upper extremity PICC line placed 9/24: A little more awake.  Off dopamine  Interim History / Subjective:  Reports fatigue but otherwise doing okay  Objective   Blood pressure 101/60, pulse (Abnormal) 42, temperature 98.6 F (37 C), temperature source Axillary, resp. rate 13, height '5\' 4"'$  (1.626 m), weight 76.2 kg, SpO2 100 %.        Intake/Output Summary (Last 24 hours) at 01/31/2022 0747 Last data filed at 01/31/2022 0600 Gross per 24 hour  Intake 2900.35 ml  Output 1625 ml  Net 1275.35 ml   Filed Weights   01/29/22 1100  Weight: 76.2 kg    Examination: General 76 year old female patient poststroke lying in bed HEENT normocephalic atraumatic no jugular venous distention appreciated still has left-sided facial droop Pulmonary clear to auscultation  Cardiac: Regular rate heart rate over the 60s to 70s off dopamine, have seen her dipped down to 40s Neuro: Awake, slow to open eyes.  Oriented x3.  Normal strength on right, left-sided weakness, seemingly a little bit stronger in left upper extremity today GU clear yellow  Resolved Hospital Problem list     Assessment & Plan:   Acute stroke, right MCA territory.  Now status post TNK and thrombectomy 9/22 with residual left sided weakness and neglect Plan Serial neurochecks  Continuing aspirin, holding Brilinta until follow-up MRI timing of MRI to be determined by neuro and interventional radiology team  Systolic blood pressure: 295-284   Intermittent bradycardia and hypotension  History of underlying coronary artery disease EF 60 to 65% with normal LV function  and no wall motion abnormality 9/22 Inclined to think this may be residual from instrumentation of carotid Plan Continue Cozaar  Pacemaker pads with  atropine at bedside  Dopamine as needed  Additional recommendations per cards   History of CKD stage III.  Serum creatinine stable Plan Serial chemistries Renal dose medications  Fluid and electrolyte imbalance: None anion gap metabolic acidosis secondary to hyperchloremia Plan Decrease normal saline once taking p.o. Daily Labs  History of hypothyroidism Plan Synthroid   Best Practice (right click and "Reselect all SmartList Selections" daily)   Diet/type: NPO w/ meds via tube DVT prophylaxis: SCD GI prophylaxis: N/A Lines: N/A Foley:  Yes, and it is still needed Code Status:  full code Last date of multidisciplinary goals of care discussion [pending]   Critical care time 32 minutes    Erick Colace ACNP-BC [place] Pager # [phone] OR # [phone] if no answer

## 2022-01-31 NOTE — Progress Notes (Signed)
OT Cancellation Note  Patient Details Name: Julia Deleon MRN: [id number] DOB: [birth year]   Cancelled Treatment:    Reason Eval/Treat Not Completed: Patient not medically ready (Due to BP and HR/ Will follow up later time)  [facility] 01/31/2022, 10:03 AM Maurie Boettcher, OT/L   Acute OT Clinical Specialist Acute Rehabilitation Services Pager [phone] Office [phone]

## 2022-01-31 NOTE — Progress Notes (Signed)
Rounding Note    Patient Name: Julia Deleon Date of Encounter: 01/31/2022  [facility] Cardiologist: None   Subjective   Somnolent but arousable.  Denies any chest pain or dyspnea  Inpatient Medications    Scheduled Meds:  aspirin  81 mg Per Tube Daily   Chlorhexidine Gluconate Cloth  6 each Topical Q0600   Chlorhexidine Gluconate Cloth  6 each Topical q morning   heparin injection (subcutaneous)  5,000 Units Subcutaneous Q8H   levothyroxine  75 mcg Per Tube Q0600   mouth rinse  15 mL Mouth Rinse 4 times per day   pantoprazole (PROTONIX) IV  40 mg Intravenous QHS   sodium chloride flush  10-40 mL Intracatheter Q12H   ticagrelor  90 mg Per Tube BID   Continuous Infusions:  sodium chloride Stopped (01/30/22 1037)   sodium chloride 75 mL/hr at 01/31/22 1100   clevidipine     DOPamine Stopped (01/31/22 0748)   norepinephrine (LEVOPHED) Adult infusion Stopped (01/30/22 1414)   potassium PHOSPHATE IVPB (in mmol) 43 mL/hr at 01/31/22 1100   sodium chloride     PRN Meds: acetaminophen **OR** acetaminophen (TYLENOL) oral liquid 160 mg/5 mL **OR** acetaminophen, acetaminophen-codeine, hydrALAZINE, HYDROmorphone (DILAUDID) injection, mouth rinse, senna-docusate, sodium chloride flush   Vital Signs    Vitals:   01/31/22 0900 01/31/22 0930 01/31/22 1000 01/31/22 1100  BP:      Pulse: (!) 43 (!) 56 (!) 52 (!) 54  Resp: '13 15 17 14  '$ Temp:      TempSrc:      SpO2: 99% 98% 98% 98%  Weight:      Height:        Intake/Output Summary (Last 24 hours) at 01/31/2022 1153 Last data filed at 01/31/2022 1100 Gross per 24 hour  Intake 2764.58 ml  Output 1225 ml  Net 1539.58 ml      01/29/2022   11:00 AM 07/06/2021    2:15 PM 04/28/2021    2:07 PM  Last 3 Weights  Weight (lbs) 167 lb 15.9 oz 164 lb 4 oz 165 lb  Weight (kg) 76.2 kg 74.503 kg 74.844 kg      Telemetry    Sinus bradycardia with rates 30s to 60- Personally Reviewed  ECG    No new- Personally  Reviewed  Physical Exam   GEN: Somnolent but arousable Neck: No JVD Cardiac: Bradycardic, regular, no murmurs Respiratory: Clear to auscultation bilaterally. GI: Soft, nontende MS: No edema; No deformity. Neuro: Left-sided hemiparesis  Labs    High Sensitivity Troponin:   Recent Labs  Lab 01/30/22 1000 01/30/22 1238  TROPONINIHS 31* 67*     Chemistry Recent Labs  Lab 01/29/22 2338 01/30/22 1000 01/30/22 1105 01/31/22 0417  NA 141 143  --  142  K 3.9 3.7  --  3.8  CL 116* 119*  --  120*  CO2 18* 17*  --  17*  GLUCOSE 135* 104*  --  105*  BUN 18 19  --  18  CREATININE 1.02* 1.22*  --  1.13*  CALCIUM 7.6* 7.5*  --  7.8*  MG  --  2.1 2.1 2.2  PROT 5.0*  --   --   --   ALBUMIN 2.9*  --   --   --   AST 15  --   --   --   ALT 16  --   --   --   ALKPHOS 39  --   --   --  BILITOT 0.7  --   --   --   GFRNONAA 58* 47*  --  51*  ANIONGAP 7 7  --  5    Lipids  Recent Labs  Lab 01/30/22 0639  CHOL 169  TRIG 96  HDL 25*  LDLCALC 125*  CHOLHDL 6.8    Hematology Recent Labs  Lab 01/29/22 1107 01/29/22 1115 01/29/22 1915 01/30/22 0933 01/31/22 0417  WBC 6.7  --   --  11.9* 9.9  RBC 3.89  --   --  3.21* 3.29*  HGB 12.7   < > 10.5* 10.2* 10.3*  HCT 36.8   < > 31.0* 31.0* 30.7*  MCV 94.6  --   --  96.6 93.3  MCH 32.6  --   --  31.8 31.3  MCHC 34.5  --   --  32.9 33.6  RDW 13.0  --   --  13.3 13.7  PLT 196  --   --  186 171   < > = values in this interval not displayed.   Thyroid  Recent Labs  Lab 01/30/22 1000  TSH 1.434    BNPNo results for input(s): "BNP", "PROBNP" in the last 168 hours.  DDimer No results for input(s): "DDIMER" in the last 168 hours.   Radiology    [place] EKG SITE RITE  Result Date: 01/30/2022 If [facility] image not attached, placement could not be confirmed due to current cardiac rhythm.  DG Abd Portable 1V  Result Date: 01/30/2022 CLINICAL DATA:  76 year old female status post nasogastric tube placement. EXAM: PORTABLE ABDOMEN  - 1 VIEW COMPARISON:  No priors. FINDINGS: Tip of nasogastric tube is in the proximal stomach with side port just proximal to the gastroesophageal junction. Paucity of bowel gas in the visualized abdomen (abdomen is incompletely imaged). IMPRESSION: 1. Nasogastric tube is in a high position and could be advanced 10-15 cm for more optimal placement. Electronically Signed   By: Vinnie Langton M.D.   On: 01/30/2022 05:35   CT HEAD WO CONTRAST (5MM)  Result Date: 01/30/2022 CLINICAL DATA:  Follow-up examination for stroke. EXAM: CT HEAD WITHOUT CONTRAST TECHNIQUE: Contiguous axial images were obtained from the base of the skull through the vertex without intravenous contrast. RADIATION DOSE REDUCTION: This exam was performed according to the departmental dose-optimization program which includes automated exposure control, adjustment of the mA and/or kV according to patient size and/or use of iterative reconstruction technique. COMPARISON:  Prior intra procedural CT from 01/29/2022. FINDINGS: Brain: Sequelae of interval stenting at the right M1 segment. Hyperdensity seen within the right sylvian fissure. Parenchymal hypodensity seen involving the parasagittal and anterior right frontal lobe. Findings could reflect contrast staining and/or subarachnoid hemorrhage, and are similar to prior. Few scattered superimposed calcific densities, likely atherosclerotic calcifications. Evidence for underlying evolving right MCA territory infarct, most notable at the right basal ganglia. No significant regional mass effect at this time. No midline shift. No other acute intracranial hemorrhage. No other new large vessel territory infarct. No mass lesion or extra-axial fluid collection. No hydrocephalus. Vascular: Vascular stent in place at the right M1 segment. Additional stent in place at the petrous right ICA, extending into the cavernous segment. Some residual contrast material seen within the intracranial vasculature.  Vascular calcifications present about the skull base. No new worrisome hyperdense vessel. Skull: Scalp soft tissues and calvarium demonstrate no new finding. Sinuses/Orbits: Globes and orbital soft tissues within normal limits. Scattered mucosal thickening noted about the ethmoidal air cells and maxillary sinuses. Paranasal  sinuses are otherwise clear. Mastoid air cells remain clear. Other: None. IMPRESSION: 1. Sequelae of interval stenting of the right ICA and MCA. Scattered hyperdensity involving the right anterior and medial frontal region as well as the right sylvian fissure, similar to prior intra procedural CT. While this finding almost certainly reflects a degree of contrast staining, superimposed subarachnoid hemorrhage at the right sylvian fissure not excluded. Attention at follow-up recommended. 2. Underlying evolving right MCA territory infarct. No significant regional mass effect at this time. 3. No other new acute intracranial abnormality. These results were communicated to Dr. Rory Percy at 2:45 am on 01/30/2022 by text page via the [facility] messaging system. Electronically Signed   By: Jeannine Boga M.D.   On: 01/30/2022 02:46   ECHOCARDIOGRAM COMPLETE  Result Date: 01/29/2022    ECHOCARDIOGRAM REPORT   Patient Name:   Julia Deleon Date of Exam: 01/29/2022 Medical Rec #:  [id number]   Height:       64.0 in Accession #:    [id number]  Weight:       168.0 lb Date of Birth:  1945-07-12    BSA:          1.817 m Patient Age:    22 years    BP:           146/83 mmHg Patient Gender: F           HR:           50 bpm. Exam Location:  Inpatient Procedure: 2D Echo, Cardiac Doppler and Color Doppler Indications:    Stroke  History:        Patient has no prior history of Echocardiogram examinations.                 COPD; Risk Factors:Dyslipidemia and Hypertension.  Sonographer:    Jefferey Pica Referring Phys: [place]  1. Left ventricular ejection fraction, by estimation, is 60 to 65%. The  left ventricle has normal function. The left ventricle has no regional wall motion abnormalities. Left ventricular diastolic parameters were normal.  2. Right ventricular systolic function is normal. The right ventricular size is normal.  3. The mitral valve is normal in structure. No evidence of mitral valve regurgitation. No evidence of mitral stenosis.  4. The aortic valve is normal in structure. Aortic valve regurgitation is mild. Aortic valve sclerosis/calcification is present, without any evidence of aortic stenosis.  5. The inferior vena cava is normal in size with greater than 50% respiratory variability, suggesting right atrial pressure of 3 mmHg. FINDINGS  Left Ventricle: Left ventricular ejection fraction, by estimation, is 60 to 65%. The left ventricle has normal function. The left ventricle has no regional wall motion abnormalities. The left ventricular internal cavity size was normal in size. There is  no left ventricular hypertrophy. Left ventricular diastolic parameters were normal. Right Ventricle: The right ventricular size is normal. No increase in right ventricular wall thickness. Right ventricular systolic function is normal. Left Atrium: Left atrial size was normal in size. Right Atrium: Right atrial size was normal in size. Pericardium: There is no evidence of pericardial effusion. Mitral Valve: The mitral valve is normal in structure. There is mild thickening of the mitral valve leaflet(s). Mild mitral annular calcification. No evidence of mitral valve regurgitation. No evidence of mitral valve stenosis. Tricuspid Valve: The tricuspid valve is normal in structure. Tricuspid valve regurgitation is trivial. No evidence of tricuspid stenosis. Aortic Valve: The aortic valve is normal in structure. Aortic  valve regurgitation is mild. Aortic regurgitation PHT measures 726 msec. Aortic valve sclerosis/calcification is present, without any evidence of aortic stenosis. Aortic valve peak gradient  measures 7.7 mmHg. Pulmonic Valve: The pulmonic valve was normal in structure. Pulmonic valve regurgitation is trivial. No evidence of pulmonic stenosis. Aorta: The aortic root is normal in size and structure. Venous: The inferior vena cava is normal in size with greater than 50% respiratory variability, suggesting right atrial pressure of 3 mmHg. IAS/Shunts: No atrial level shunt detected by color flow Doppler.  LEFT VENTRICLE PLAX 2D LVIDd:         4.40 cm   Diastology LVIDs:         2.20 cm   LV e' medial:    6.38 cm/s LV PW:         1.00 cm   LV E/e' medial:  9.9 LV IVS:        1.15 cm   LV e' lateral:   8.63 cm/s LVOT diam:     2.00 cm   LV E/e' lateral: 7.3 LV SV:         81 LV SV Index:   45 LVOT Area:     3.14 cm  RIGHT VENTRICLE             IVC RV Basal diam:  2.60 cm     IVC diam: 1.80 cm RV S prime:     11.70 cm/s TAPSE (M-mode): 2.3 cm LEFT ATRIUM             Index        RIGHT ATRIUM           Index LA diam:        3.90 cm 2.15 cm/m   RA Area:     10.70 cm LA Vol (A2C):   40.7 ml 22.40 ml/m  RA Volume:   20.40 ml  11.23 ml/m LA Vol (A4C):   30.2 ml 16.62 ml/m LA Biplane Vol: 35.2 ml 19.38 ml/m  AORTIC VALVE                 PULMONIC VALVE AV Area (Vmax): 2.71 cm     PV Vmax:       0.74 m/s AV Vmax:        139.00 cm/s  PV Peak grad:  2.2 mmHg AV Peak Grad:   7.7 mmHg LVOT Vmax:      120.00 cm/s LVOT Vmean:     65.100 cm/s LVOT VTI:       0.258 m AI PHT:         726 msec  AORTA Ao Root diam: 3.40 cm Ao Asc diam:  3.70 cm MITRAL VALVE MV Area (PHT): 3.77 cm     SHUNTS MV Decel Time: 201 msec     Systemic VTI:  0.26 m MV E velocity: 62.90 cm/s   Systemic Diam: 2.00 cm MV A velocity: 100.00 cm/s MV E/A ratio:  0.63 Aditya Sabharwal Electronically signed by Hebert Soho Signature Date/Time: 01/29/2022/4:34:48 PM    Final    CT HEAD WO CONTRAST (5MM)  Result Date: 01/29/2022 CLINICAL DATA:  Altered mental status EXAM: CT HEAD WITHOUT CONTRAST TECHNIQUE: Contiguous axial images were obtained from  the base of the skull through the vertex without intravenous contrast. RADIATION DOSE REDUCTION: This exam was performed according to the departmental dose-optimization program which includes automated exposure control, adjustment of the mA and/or kV according to patient size and/or use of iterative reconstruction technique. COMPARISON:  CT head 01/29/2022.  Brain MRI 01/30/2019 FINDINGS: Brain: Redemonstrated are scattered sites of intracranial calcifications, which appear unchanged compared to prior exam, and may be related to a prior infectious or inflammatory process, or sites of chronic hemorrhage. No evidence of acute hemorrhage. No CT evidence of an infarct. Subtle hypodensity seen in the right parietal cortex, in the region of previously noted infarct on prior brain MRI. Vascular: No hyperdense vessel or unexpected calcification. Skull: Normal. Negative for fracture or focal lesion. Sinuses/Orbits: Bilateral lens replacements. Other: None. IMPRESSION: No acute intracranial abnormality. Electronically Signed   By: Marin Roberts M.D.   On: 01/29/2022 16:10   MR ANGIO HEAD WO CONTRAST  Result Date: 01/29/2022 CLINICAL DATA:  Left-sided weakness.  Abnormal CTA. EXAM: MRA HEAD WITHOUT CONTRAST TECHNIQUE: Angiographic images of the Circle of Willis were acquired using MRA technique without intravenous contrast. COMPARISON:  CTA of the head and neck 01/29/2022 FINDINGS: Anterior circulation: High cervical segments are within normal limits. Atherosclerotic changes are present within the cavernous ICA bilaterally. Moderate stenosis is present on the right. Mild narrowing is present the left. The ICA termini are within normal limits bilaterally. A high-grade right M1 segment stenosis is present with asymmetric attenuation of distal MCA branch vessels. Mild narrowing is present proximal right A1 segment. ACA branch vessels are within normal limits. Left A1 and M1 segments are within normal limits. MCA bifurcation  is normal. Left MCA branch vessels are unremarkable. Posterior circulation: The left vertebral artery is the dominant vessel. The right PICA scratched at the right vertebral artery is centrally terminates at the PICA a with only a very hypoplastic V4 segment. Basilar artery is normal. Both posterior cerebral arteries originate from the basilar tip. Mild right P2 segment stenosis is present. Scattered irregularity is present without other significant stenosis. Anatomic variants: None Other: None. IMPRESSION: 1. Moderate stenosis of the right cavernous ICA. 2. Mild narrowing of the left cavernous ICA. 3. High-grade right M1 segment stenosis with asymmetric attenuation of distal MCA branch vessels. 4. Mild narrowing of the proximal right A1 segment. 5. Mild right P2 segment stenosis. 6. The left vertebral artery is the dominant vessel. Electronically Signed   By: San Morelle M.D.   On: 01/29/2022 13:46   MR BRAIN WO CONTRAST  Result Date: 01/29/2022 CLINICAL DATA:  Acute onset of left-sided weakness and right-sided gaze with slurred speech. Right M1 occlusion. EXAM: MRI HEAD WITHOUT CONTRAST TECHNIQUE: Multiplanar, multiecho pulse sequences of the brain and surrounding structures were obtained without intravenous contrast. COMPARISON:  CT head and CTA head and neck. FINDINGS: Brain: Mild restricted diffusion is present in the posterior right parietal cortex. No acute infarct is present in the insula or basal ganglia. Subtle FLAIR signal abnormalities associated with the area of restricted diffusion. Mild periventricular T2 hyperintensities are present bilaterally. A remote lacunar infarct is present in the lateral left thalamus. Remote lacunar infarct present in the right corona radiata. The ventricles are of normal size. No significant extraaxial fluid collection is present. The brainstem and cerebellum are within normal limits. The internal auditory canals are within normal limits. Vascular: Normal flow  voids are present. Skull and upper cervical spine: The craniocervical junction is normal. Upper cervical spine is within normal limits. Marrow signal is unremarkable. Sinuses/Orbits: The paranasal sinuses and mastoid air cells are clear. Bilateral lens replacements are noted. Globes and orbits are otherwise unremarkable. IMPRESSION: 1. Acute/subacute nonhemorrhagic infarct involving the posterior right parietal cortex. 2. Remote lacunar infarct of the lateral left  thalamus. 3. Remote lacunar infarct of the right corona radiata. 4. No acute infarct of the insula or right basal ganglia 5. Mild periventricular T2 hyperintensities bilaterally are mildly advanced for age. This likely reflects the sequela of chronic microvascular ischemia. The above was relayed via text pager to [place] on 01/29/2022 at 13:41 . Electronically Signed   By: San Morelle M.D.   On: 01/29/2022 13:41    Cardiac Studies     Patient Profile     76 y.o. female with a hx of COPD, CKD, hypertension, hypothyroidism who presented with acute CVA and is being seen for the evaluation of bradycardia   Assessment & Plan    Bradycardia: Intermittent sinus bradycardia down to 30s, was associated with hypotension on 9/23.  Started on dopamine with improvement.  She is now hypertensive, dopamine has been weaned off.  Bradycardia overall appears improved but will still have bradycardia to 30s intermittently, though does not appear hypotensive during episodes.  If having hypotension with bradycardia, can restart dopamine.  Suspect bradycardia is due to carotid intervention and should improve with time.  Would continue to keep pacer pads on and atropine at bedside.  Echocardiogram shows normal biventricular function, no significant valvular disease.   Acute CVA: Status post TNK and thrombectomy on 9/22.  Left-sided hemiparesis  For questions or updates, please contact [facility] Please consult [email or web address] for contact  info under        Signed, Donato Heinz, MD  01/31/2022, 11:53 AM

## 2022-01-31 NOTE — Progress Notes (Addendum)
STROKE TEAM PROGRESS NOTE   INTERVAL HISTORY Her husband and son are at the bedside.   Still bradycardic post procedure but off of dopamine. She remains neurologically unchanged and arouses to voice and is oriented with some moderate dysarthria.  Her husband and son were at the bedside and were updated.   Appreciate cardiology and critical care medicine help Vitals:   01/31/22 0615 01/31/22 0700 01/31/22 0705 01/31/22 0800  BP:      Pulse: (!) 42 (!) 42 (!) 41 (!) 37  Resp: '17 13 14 13  '$ Temp:      TempSrc:      SpO2: 98% 100% 99% 100%  Weight:      Height:       CBC:  Recent Labs  Lab 01/29/22 1107 01/29/22 1115 01/30/22 0933 01/31/22 0417  WBC 6.7  --  11.9* 9.9  NEUTROABS 3.9  --   --   --   HGB 12.7   < > 10.2* 10.3*  HCT 36.8   < > 31.0* 30.7*  MCV 94.6  --  96.6 93.3  PLT 196  --  186 171   < > = values in this interval not displayed.    Basic Metabolic Panel:  Recent Labs  Lab 01/30/22 1000 01/30/22 1105 01/31/22 0417  NA 143  --  142  K 3.7  --  3.8  CL 119*  --  120*  CO2 17*  --  17*  GLUCOSE 104*  --  105*  BUN 19  --  18  CREATININE 1.22*  --  1.13*  CALCIUM 7.5*  --  7.8*  MG 2.1 2.1 2.2  PHOS  --   --  2.1*    Lipid Panel:  Recent Labs  Lab 01/30/22 0639  CHOL 169  TRIG 96  HDL 25*  CHOLHDL 6.8  VLDL 19  LDLCALC 125*    HgbA1c:  Recent Labs  Lab 01/29/22 1107  HGBA1C 5.4    Urine Drug Screen:  Recent Labs  Lab 01/29/22 2215  LABOPIA NONE DETECTED  COCAINSCRNUR NONE DETECTED  LABBENZ NONE DETECTED  AMPHETMU NONE DETECTED  THCU NONE DETECTED  LABBARB NONE DETECTED     Alcohol Level  Recent Labs  Lab 01/29/22 1107  [place] <10     IMAGING past 24 hours [place] EKG SITE RITE  Result Date: 01/30/2022 If Site Rite image not attached, placement could not be confirmed due to current cardiac rhythm.   Physical Exam  Constitutional: Appears well-developed and well-nourished elderly Caucasian lady.   Cardiovascular: Normal  rate and regular rhythm.  Respiratory: Effort normal, non-labored breathing  Neuro: Mental Status: Patient is drowsy, but, oriented to person, place, month, year, and situation Right gaze preference, comes to midline. Follows commands speech is hesitant with some dysarthria but at times gibberish difficult to understand Cranial Nerves: II: Right gaze preference, unable to look to the left III,IV, VI: EOMI without ptosis or diploplia..  Blinks to threat on the right and not on the left. V: Facial sensation is symmetric to temperature VII: Right facial droop  VIII: Hearing is intact to voice X: voice is hypophonic  XI: Shoulder shrug is symmetric. XII: Tongue protrudes midline without atrophy or fasciculations.  Motor: Tone is normal. Bulk is normal.  Right full strength LLE 2/5 LUE 0/5 Sensory: No sensory deficit noted in any extremity  Cerebellar: FNF and HKS are intact bilaterally   ASSESSMENT/PLAN Ms. Fritzi C Hunton is a 76 y.o. female with history  of arthritis, CKD, COPD, Fuch's corneal endothelial dystrophy, HLD, HTN and hypothyroidism who presents to the ED via EMS as a Code Stroke with acute onset of left sided weakness, left facial droop and dysarthria with neglect. Initially symptoms improved in IR suite after TNK and she was taken back to her ED room. She then had a change in neurological exam and was reactivated as a Code IR.  Stroke: Right MCA infarct s/p TNK and mechanical thrombectomy with TICI2c revascularization  Etiology:  atherosclerosis and stenosis of intracranial vessels   Code Stroke CT head Loss of gray-white differentiation in the posterior right insular cortex and right lentiform nucleus compatible with acute/subacute right MCA infarct. Aspects 8/10 CTA head & neck Acute occlusion of the proximal right M1. Severe stenosis at the origin of the right ICA (>75%). Repeat CT Head Sequelae of interval stenting of the right ICA and MCA. Scattered hyper density involving  the right anterior and medial frontal region as well as the right sylvian fissure, similar to prior intra procedural CT. While this finding almost certainly reflects a degree of contrast staining, superimposed subarachnoid hemorrhage at the right sylvian fissure not excluded. Underlying evolving right MCA territory infarct. Post IR CT contrast staining in the basal ganglia and right medial frontal cortex as well as trace hyperdensity in the right sylvian fissure. MRI Pre procedure- Acute/subacute nonhemorrhagic infarct involving the posterior right parietal cortex  MRA  Pre procedure 1. Moderate stenosis of the right cavernous ICA. 2. Mild narrowing of the left cavernous ICA. 3. High-grade right M1 segment stenosis with asymmetric attenuation of distal MCA branch vessels. 4. Mild narrowing of the proximal right A1 segment. 5. Mild right P2 segment stenosis. 6. The left vertebral artery is the dominant vessel. 2D Echo EF 55-60% LDL 125 HgbA1c 5.4 VTE prophylaxis - SCDs    Diet   Diet NPO time specified   No antithrombotic prior to admission, now on aspirin 81 mg daily and Brilinta (ticagrelor) 90 mg bid.  Therapy recommendations:  Pending Disposition:  Pending  Mechanical Thrombectomy with cervical carotid angioplasty and stent placement Mechanical thrombectomy performed with direct contact aspiration with recanalization of the M1 segment with residual atherosclerotic stenosis. Mechanical thrombectomy performed with stent retriever with recanalization of M2  (TICI 2C).  [facility] post procedure- IR following for stabilization so this can be coiled once she is stabilized  Hx of hypertension now hypotensive with bradycardia requiring vasopressors Home meds:  Losartan Unstable BP goal less than 160 Dopamine off Norepinephrine off  Hyperlipidemia Home meds:  None, LDL 125, goal < 70 add when able to take PO- reports allergy to simvastatin  Dysphagia NG tube in place  SLP following    Other Stroke Risk Factors Advanced Age >/= 46   Other Active Problems CKD Stage 3 Cr and GFR stable  Intermittent bradycardia with hypotension Now on dopamine Cardiology consulted CCM consulted Hold antihypertensive medications PICC line placed for vasopressors  Headache Added tylenol with codeine PRN Topamax '25mg'$  BID COPD Not in acute exacerbation   Hospital day # 2  Patient seen and examined by NP/APP with MD. MD to update note as needed.   Janine Ores, DNP, FNP-BC [facility] Pager: [phone]  I have personally obtained history,examined this patient, reviewed notes, independently viewed imaging studies, participated in medical decision making and plan of care.ROS completed by me personally and pertinent positives fully documented  I have made any additions or clarifications directly to the above note. Agree with note above.  Patient is hemodynamically better today and is off pressor support.  Continue mobilization out of bed and therapy consults.  Appreciate cardiology and critical care help.  Long discussion at bedside with patient husband and son and answered questions.  Discussed with critical care team.This patient is critically ill and at significant risk of neurological worsening, death and care requires constant monitoring of vital signs, hemodynamics,respiratory and cardiac monitoring, extensive review of multiple databases, frequent neurological assessment, discussion with family, other specialists and medical decision making of high complexity.I have made any additions or clarifications directly to the above note.This critical care time does not reflect procedure time, or teaching time or supervisory time of PA/NP/Med Resident etc but could involve care discussion time.  I spent 30 minutes of neurocritical care time  in the care of  this patient.      Antony Contras, MD Medical Director [facility] Pager: [phone] 01/31/2022 4:04  PM    To contact Stroke Continuity provider, please refer to [email or web address] After hours, contact General Neurology

## 2022-01-31 NOTE — Progress Notes (Signed)
SLP Cancellation Note  Patient Details Name: Julia Deleon January MRN: [id number] DOB: 21-May-1945   Cancelled treatment:       Reason Eval/Treat Not Completed: Medical issues which prohibited therapy;Other (comment). Per RN, patient lethargic and not yet ready for evaluation . SLP will f/u next date.    Sonia Baller, MA, CCC-SLP Speech Therapy

## 2022-01-31 NOTE — Evaluation (Signed)
Physical Therapy Evaluation Patient Details Name: Julia Deleon MRN: [id number] DOB: 1945-06-05 Today's Date: 01/31/2022  History of Present Illness  Patient is a 76 y/o female who presents on 9/22 with left sided weakness and slurred speech. Found to have Rt M1 occlusion s/p TNK and mechanical thrombectomy 9/22. Developed bradycardia with hypotension in ICU.  PMH includes COPD, back surgery, CKD.  Clinical Impression  Patient presents with left hemiparesis, lethargy, bradycardia and impaired mobility s/p above. Pt reports being independent for ADLs/IADLs and ambulation PTA. Pt works out with a Clinical research associate twice daily and drives. Bed evaluation performed today due to decreased level of arousal and needing second person for safety OOB. Able to roll with Max A and cueing. Noted to have increased tone in LLE and decreased cervical AROM towards right. Pt following commands when she awakens but quickly falls back asleep without stimulus. HR in low 40s during session. Would benefit from AIR to maximize independence and mobility prior to return home. Has supportive spouse at home. Will follow acutely.       Recommendations for follow up therapy are one component of a multi-disciplinary discharge planning process, led by the attending physician.  Recommendations may be updated based on patient status, additional functional criteria and insurance authorization.  Follow Up Recommendations Acute inpatient rehab (3hours/day)      Assistance Recommended at Discharge Frequent or constant Supervision/Assistance  Patient can return home with the following  Two people to help with walking and/or transfers;A lot of help with bathing/dressing/bathroom;Assistance with cooking/housework;Direct supervision/assist for medications management;Assist for transportation;Help with stairs or ramp for entrance    Equipment Recommendations Other (comment) (TBA)  Recommendations for Other Services       Functional Status  Assessment Patient has had a recent decline in their functional status and demonstrates the ability to make significant improvements in function in a reasonable and predictable amount of time.     Precautions / Restrictions Precautions Precautions: Fall;Other (comment) Precaution Comments: watch HR, NGT Restrictions Weight Bearing Restrictions: No      Mobility  Bed Mobility Overal bed mobility: Needs Assistance Bed Mobility: Rolling Rolling: Max assist         General bed mobility comments: Max A to roll to left/right with max cues, pt sleepy.    Transfers                   General transfer comment: Deferred due to arousal and needing a second person for safety    Ambulation/Gait                  Stairs            Wheelchair Mobility    Modified Rankin (Stroke Patients Only) Modified Rankin (Stroke Patients Only) Pre-Morbid Rankin Score: Slight disability Modified Rankin: Severe disability     Balance                                             Pertinent Vitals/Pain Pain Assessment Pain Assessment: Faces Faces Pain Scale: No hurt    Home Living Family/patient expects to be discharged to:: Private residence Living Arrangements: Spouse/significant other Available Help at Discharge: Family;Available 24 hours/day Type of Home: House Home Access: Stairs to enter Entrance Stairs-Rails: Right Entrance Stairs-Number of Steps: 8   Home Layout: One level Home Equipment: Conservation officer, nature (2 wheels);Cane - single point;BSC/3in1;Shower seat  Prior Function Prior Level of Function : Independent/Modified Independent             Mobility Comments: independent, drives. Works out twice weekly with a Clinical research associate ADLs Comments: independent     Journalist, newspaper   Dominant Hand: Right    Extremity/Trunk Assessment   Upper Extremity Assessment Upper Extremity Assessment: Defer to OT evaluation;LUE deficits/detail LUE  Deficits / Details: Weak grip, and some initiation of shoulder movement noted but no other active movement appreciated    Lower Extremity Assessment Lower Extremity Assessment: LLE deficits/detail;Difficult to assess due to impaired cognition LLE Deficits / Details: Increased tone present, no active movement appreciated to command. LLE Sensation: WNL    Cervical / Trunk Assessment Cervical / Trunk Assessment: Other exceptions Cervical / Trunk Exceptions: favors left cervical rotation, difficulty turning towards right with discomfort  Communication   Communication: No difficulties  Cognition Arousal/Alertness: Lethargic Behavior During Therapy: WFL for tasks assessed/performed Overall Cognitive Status: Difficult to assess                                 General Comments: Pt lethargic and sleepy during session. Wakes up for moments but then falls back asleep. Oriented x4, following commands when awake.        General Comments General comments (skin integrity, edema, etc.): HR in low 40s during session. BP 112/50.    Exercises General Exercises - Lower Extremity Heel Slides: PROM, Left, 5 reps, Supine (tone present) Hip ABduction/ADduction: PROM, Left, 5 reps, Supine   Assessment/Plan    PT Assessment Patient needs continued PT services  PT Problem List Decreased range of motion;Decreased strength;Decreased mobility;Decreased safety awareness;Impaired tone;Decreased balance;Decreased cognition       PT Treatment Interventions Therapeutic activities;Gait training;Therapeutic exercise;Patient/family education;DME instruction;Cognitive remediation;Balance training;Wheelchair mobility training;Functional mobility training;Neuromuscular re-education    PT Goals (Current goals can be found in the Care Plan section)  Acute Rehab PT Goals Patient Stated Goal: none stated PT Goal Formulation: Patient unable to participate in goal setting Time For Goal Achievement:  02/14/22 Potential to Achieve Goals: Fair    Frequency Min 4X/week     Co-evaluation               AM-PAC PT "6 Clicks" Mobility  Outcome Measure Help needed turning from your back to your side while in a flat bed without using bedrails?: A Lot Help needed moving from lying on your back to sitting on the side of a flat bed without using bedrails?: Total Help needed moving to and from a bed to a chair (including a wheelchair)?: Total Help needed standing up from a chair using your arms (e.g., wheelchair or bedside chair)?: Total Help needed to walk in hospital room?: Total Help needed climbing 3-5 steps with a railing? : Total 6 Click Score: 7    End of Session   Activity Tolerance: Patient limited by lethargy Patient left: in bed;with call bell/phone within reach;with bed alarm set Nurse Communication: Mobility status PT Visit Diagnosis: Hemiplegia and hemiparesis;Difficulty in walking, not elsewhere classified (R26.2);Unsteadiness on feet (R26.81) Hemiplegia - Right/Left: Left Hemiplegia - dominant/non-dominant: Non-dominant Hemiplegia - caused by: Cerebral infarction    Time: 1421-1435 PT Time Calculation (min) (ACUTE ONLY): 14 min   Charges:   PT Evaluation $PT Eval Moderate Complexity: 1 Mod          Marisa Severin, PT, DPT Acute Rehabilitation Services Secure chat preferred Office [phone]  Julia Deleon 01/31/2022, 3:03 PM

## 2022-01-31 NOTE — Progress Notes (Signed)
Inpatient Rehab Admissions Coordinator Note:   Per PT patient was screened for CIR candidacy by Evan Mackie Danford Bad, CCC-SLP. At this time, pt has not yet attempted transfers. Pt may have potential to progress to becoming a potential CIR candidate. CIR admissions team will follow to monitor for progress and participation in therapies. A consult order will be placed if pt appears to be an appropriate candidate.   Gayland Curry, [place], [place] Admissions Coordinator [phone] 01/31/22 5:44 PM

## 2022-01-31 NOTE — Progress Notes (Signed)
Patient's new BP goal is sys less than 200. Pt's Dopamine has been off since 0745. Family is at the bedside and all questions have been answered. Kortney Potvin, Rande Brunt, RN

## 2022-01-31 NOTE — Progress Notes (Signed)
PT Cancellation Note  Patient Details Name: Julia Deleon Nurse MRN: [id number] DOB: [birth year]   Cancelled Treatment:    Reason Eval/Treat Not Completed: Medical issues which prohibited therapy Per RN holding PT as pt not medically ready due to labile BP and HR. Will follow.  Julia Deleon 01/31/2022, 8:32 AM Marisa Severin, PT, DPT Acute Rehabilitation Services Secure chat preferred Office [phone]

## 2022-01-31 NOTE — Progress Notes (Signed)
Chief Complaint: Patient was seen today for follow up CVA with intervention   Supervising Physician: Pedro Earls  Patient Status: [facility] - In-pt  Subjective: Rt MCA occlusion as well as near occlusive stenosis of (R)ICA at the bifurcation. S/p angiogram with thrombectomy of M1/M2 and subsequent stent angioplasty. Subsequent finding of CCF requiring 2 telescoping Pipeline flow diverting stents placed across fistula. Finally (R)ICA stent angioplasty. Pt admitted to Neuro ICU  Bradycardia some better than yesterday. Had to start dopamine yesterday but this has been stopped, BPs improved.  Complain of right shoulder pains. (R)UE PICC palced yesterday but family reports she c/o shoulder pain before that. Family at bedside.   Objective: Physical Exam: BP 101/60 (BP Location: Right Leg)   Pulse (!) 39   Temp 98.2 F (36.8 C) (Axillary)   Resp 12   Ht '5\' 4"'$  (1.626 m)   Wt 167 lb 15.9 oz (76.2 kg)   LMP  (LMP Unknown)   SpO2 99%   BMI 28.84 kg/m  Resting in bed. Somnolent but arousable (L)sided strength a little better (R)groin soft, NT   Current Facility-Administered Medications:    0.9 %  sodium chloride infusion, 250 mL, Intravenous, Continuous, Erick Colace, NP, Held at 01/30/22 1037   0.9 %  sodium chloride infusion, , Intravenous, Continuous, Erick Colace, NP, Last Rate: 75 mL/hr at 01/31/22 1200, Infusion Verify at 01/31/22 1200   acetaminophen (TYLENOL) tablet 650 mg, 650 mg, Oral, Q4H PRN **OR** acetaminophen (TYLENOL) 160 MG/5ML solution 650 mg, 650 mg, Per Tube, Q4H PRN, 650 mg at 01/31/22 0004 **OR** acetaminophen (TYLENOL) suppository 650 mg, 650 mg, Rectal, Q4H PRN, de Sindy Messing, Erven Colla, MD, 650 mg at 01/29/22 2121   acetaminophen-codeine (TYLENOL #3) 300-30 MG per tablet 2 tablet, 2 tablet, Per Tube, Q6H PRN, Henri Medal, RPH, 2 tablet at 01/31/22 1042   aspirin chewable tablet 81 mg, 81 mg, Per Tube, Daily, Henri Medal,  RPH, 81 mg at 01/31/22 0916   Chlorhexidine Gluconate Cloth 2 % PADS 6 each, 6 each, Topical, Q0600, Kerney Elbe, MD, 6 each at 01/29/22 2039   Chlorhexidine Gluconate Cloth 2 % PADS 6 each, 6 each, Topical, q morning, Amie Portland, MD, 6 each at 01/30/22 0930   clevidipine (CLEVIPREX) infusion 0.5 mg/mL, 0-21 mg/hr, Intravenous, Continuous, de Sindy Messing, Kimball, MD   DOPamine (INTROPIN) 800 mg in dextrose 5 % 250 mL (3.2 mg/mL) infusion, 0-20 mcg/kg/min, Intravenous, Titrated, Erick Colace, NP, Stopped at 01/31/22 0748   heparin injection 5,000 Units, 5,000 Units, Subcutaneous, Q8H, Shafer, Devon, NP   hydrALAZINE (APRESOLINE) injection 10 mg, 10 mg, Intravenous, Q4H PRN, Leonie Man, Pramod S, MD   levothyroxine (SYNTHROID) tablet 75 mcg, 75 mcg, Per Tube, Q0600, de Sindy Messing, Erven Colla, MD, 75 mcg at 01/31/22 0505   norepinephrine (LEVOPHED) '4mg'$  in 224m (0.016 mg/mL) premix infusion, 0-40 mcg/min, Intravenous, Titrated, BHenri Medal RPH, Stopped at 01/30/22 1414   Oral care mouth rinse, 15 mL, Mouth Rinse, 4 times per day, SJanine Ores NP, 15 mL at 01/31/22 0800   Oral care mouth rinse, 15 mL, Mouth Rinse, PRN, SJanine Ores NP   pantoprazole (PROTONIX) injection 40 mg, 40 mg, Intravenous, QHS, LKerney Elbe MD, 40 mg at 01/30/22 2218   potassium PHOSPHATE 15 mmol in dextrose 5 % 250 mL infusion, 15 mmol, Intravenous, Once, DLaqueta Jean MD, Last Rate: 43 mL/hr at 01/31/22 1200, Infusion Verify at 01/31/22 1200   senna-docusate (Senokot-S) tablet  1 tablet, 1 tablet, Oral, QHS PRN, Kerney Elbe, MD   sodium chloride 0.9 % bolus 1,000 mL, 1,000 mL, Intravenous, Once, Kerney Elbe, MD   sodium chloride flush (NS) 0.9 % injection 10-40 mL, 10-40 mL, Intracatheter, Q12H, Leonie Man, Pramod S, MD, 10 mL at 01/31/22 0916   sodium chloride flush (NS) 0.9 % injection 10-40 mL, 10-40 mL, Intracatheter, PRN, Garvin Fila, MD   ticagrelor (BRILINTA) tablet 90 mg, 90 mg, Per Tube,  BID, Ascencion Dike, PA-C, 90 mg at 01/31/22 8891  Facility-Administered Medications Ordered in Other Encounters:    sodium phosphate (FLEET) 7-19 GM/118ML enema 1 enema, 1 enema, Rectal, Once, Ardis Hughs, MD  Labs: CBC Recent Labs    01/30/22 0933 01/31/22 0417  WBC 11.9* 9.9  HGB 10.2* 10.3*  HCT 31.0* 30.7*  PLT 186 171   BMET Recent Labs    01/30/22 1000 01/31/22 0417  NA 143 142  K 3.7 3.8  CL 119* 120*  CO2 17* 17*  GLUCOSE 104* 105*  BUN 19 18  CREATININE 1.22* 1.13*  CALCIUM 7.5* 7.8*   LFT Recent Labs    01/29/22 2338  PROT 5.0*  ALBUMIN 2.9*  AST 15  ALT 16  ALKPHOS 39  BILITOT 0.7   PT/INR Recent Labs    01/29/22 1107  LABPROT 13.9  INR 1.1     Studies/Results: [place] EKG SITE RITE  Result Date: 01/30/2022 If Site Rite image not attached, placement could not be confirmed due to current cardiac rhythm.  DG Abd Portable 1V  Result Date: 01/30/2022 CLINICAL DATA:  76 year old female status post nasogastric tube placement. EXAM: PORTABLE ABDOMEN - 1 VIEW COMPARISON:  No priors. FINDINGS: Tip of nasogastric tube is in the proximal stomach with side port just proximal to the gastroesophageal junction. Paucity of bowel gas in the visualized abdomen (abdomen is incompletely imaged). IMPRESSION: 1. Nasogastric tube is in a high position and could be advanced 10-15 cm for more optimal placement. Electronically Signed   By: Vinnie Langton M.D.   On: 01/30/2022 05:35   CT HEAD WO CONTRAST (5MM)  Result Date: 01/30/2022 CLINICAL DATA:  Follow-up examination for stroke. EXAM: CT HEAD WITHOUT CONTRAST TECHNIQUE: Contiguous axial images were obtained from the base of the skull through the vertex without intravenous contrast. RADIATION DOSE REDUCTION: This exam was performed according to the departmental dose-optimization program which includes automated exposure control, adjustment of the mA and/or kV according to patient size and/or use of iterative  reconstruction technique. COMPARISON:  Prior intra procedural CT from 01/29/2022. FINDINGS: Brain: Sequelae of interval stenting at the right M1 segment. Hyperdensity seen within the right sylvian fissure. Parenchymal hypodensity seen involving the parasagittal and anterior right frontal lobe. Findings could reflect contrast staining and/or subarachnoid hemorrhage, and are similar to prior. Few scattered superimposed calcific densities, likely atherosclerotic calcifications. Evidence for underlying evolving right MCA territory infarct, most notable at the right basal ganglia. No significant regional mass effect at this time. No midline shift. No other acute intracranial hemorrhage. No other new large vessel territory infarct. No mass lesion or extra-axial fluid collection. No hydrocephalus. Vascular: Vascular stent in place at the right M1 segment. Additional stent in place at the petrous right ICA, extending into the cavernous segment. Some residual contrast material seen within the intracranial vasculature. Vascular calcifications present about the skull base. No new worrisome hyperdense vessel. Skull: Scalp soft tissues and calvarium demonstrate no new finding. Sinuses/Orbits: Globes and orbital soft tissues within normal limits. Scattered  mucosal thickening noted about the ethmoidal air cells and maxillary sinuses. Paranasal sinuses are otherwise clear. Mastoid air cells remain clear. Other: None. IMPRESSION: 1. Sequelae of interval stenting of the right ICA and MCA. Scattered hyperdensity involving the right anterior and medial frontal region as well as the right sylvian fissure, similar to prior intra procedural CT. While this finding almost certainly reflects a degree of contrast staining, superimposed subarachnoid hemorrhage at the right sylvian fissure not excluded. Attention at follow-up recommended. 2. Underlying evolving right MCA territory infarct. No significant regional mass effect at this time. 3. No  other new acute intracranial abnormality. These results were communicated to Dr. Rory Percy at 2:45 am on 01/30/2022 by text page via the [facility] messaging system. Electronically Signed   By: Jeannine Boga M.D.   On: 01/30/2022 02:46   ECHOCARDIOGRAM COMPLETE  Result Date: 01/29/2022    ECHOCARDIOGRAM REPORT   Patient Name:   LILLITH MCNEFF Date of Exam: 01/29/2022 Medical Rec #:  [id number]   Height:       64.0 in Accession #:    [id number]  Weight:       168.0 lb Date of Birth:  21-Jan-1946    BSA:          1.817 m Patient Age:    76 years    BP:           146/83 mmHg Patient Gender: F           HR:           50 bpm. Exam Location:  Inpatient Procedure: 2D Echo, Cardiac Doppler and Color Doppler Indications:    Stroke  History:        Patient has no prior history of Echocardiogram examinations.                 COPD; Risk Factors:Dyslipidemia and Hypertension.  Sonographer:    Jefferey Pica Referring Phys: [place]  1. Left ventricular ejection fraction, by estimation, is 60 to 65%. The left ventricle has normal function. The left ventricle has no regional wall motion abnormalities. Left ventricular diastolic parameters were normal.  2. Right ventricular systolic function is normal. The right ventricular size is normal.  3. The mitral valve is normal in structure. No evidence of mitral valve regurgitation. No evidence of mitral stenosis.  4. The aortic valve is normal in structure. Aortic valve regurgitation is mild. Aortic valve sclerosis/calcification is present, without any evidence of aortic stenosis.  5. The inferior vena cava is normal in size with greater than 50% respiratory variability, suggesting right atrial pressure of 3 mmHg. FINDINGS  Left Ventricle: Left ventricular ejection fraction, by estimation, is 60 to 65%. The left ventricle has normal function. The left ventricle has no regional wall motion abnormalities. The left ventricular internal cavity size was normal in size. There  is  no left ventricular hypertrophy. Left ventricular diastolic parameters were normal. Right Ventricle: The right ventricular size is normal. No increase in right ventricular wall thickness. Right ventricular systolic function is normal. Left Atrium: Left atrial size was normal in size. Right Atrium: Right atrial size was normal in size. Pericardium: There is no evidence of pericardial effusion. Mitral Valve: The mitral valve is normal in structure. There is mild thickening of the mitral valve leaflet(s). Mild mitral annular calcification. No evidence of mitral valve regurgitation. No evidence of mitral valve stenosis. Tricuspid Valve: The tricuspid valve is normal in structure. Tricuspid valve regurgitation is trivial. No evidence  of tricuspid stenosis. Aortic Valve: The aortic valve is normal in structure. Aortic valve regurgitation is mild. Aortic regurgitation PHT measures 726 msec. Aortic valve sclerosis/calcification is present, without any evidence of aortic stenosis. Aortic valve peak gradient measures 7.7 mmHg. Pulmonic Valve: The pulmonic valve was normal in structure. Pulmonic valve regurgitation is trivial. No evidence of pulmonic stenosis. Aorta: The aortic root is normal in size and structure. Venous: The inferior vena cava is normal in size with greater than 50% respiratory variability, suggesting right atrial pressure of 3 mmHg. IAS/Shunts: No atrial level shunt detected by color flow Doppler.  LEFT VENTRICLE PLAX 2D LVIDd:         4.40 cm   Diastology LVIDs:         2.20 cm   LV e' medial:    6.38 cm/s LV PW:         1.00 cm   LV E/e' medial:  9.9 LV IVS:        1.15 cm   LV e' lateral:   8.63 cm/s LVOT diam:     2.00 cm   LV E/e' lateral: 7.3 LV SV:         81 LV SV Index:   45 LVOT Area:     3.14 cm  RIGHT VENTRICLE             IVC RV Basal diam:  2.60 cm     IVC diam: 1.80 cm RV S prime:     11.70 cm/s TAPSE (M-mode): 2.3 cm LEFT ATRIUM             Index        RIGHT ATRIUM           Index LA  diam:        3.90 cm 2.15 cm/m   RA Area:     10.70 cm LA Vol (A2C):   40.7 ml 22.40 ml/m  RA Volume:   20.40 ml  11.23 ml/m LA Vol (A4C):   30.2 ml 16.62 ml/m LA Biplane Vol: 35.2 ml 19.38 ml/m  AORTIC VALVE                 PULMONIC VALVE AV Area (Vmax): 2.71 cm     PV Vmax:       0.74 m/s AV Vmax:        139.00 cm/s  PV Peak grad:  2.2 mmHg AV Peak Grad:   7.7 mmHg LVOT Vmax:      120.00 cm/s LVOT Vmean:     65.100 cm/s LVOT VTI:       0.258 m AI PHT:         726 msec  AORTA Ao Root diam: 3.40 cm Ao Asc diam:  3.70 cm MITRAL VALVE MV Area (PHT): 3.77 cm     SHUNTS MV Decel Time: 201 msec     Systemic VTI:  0.26 m MV E velocity: 62.90 cm/s   Systemic Diam: 2.00 cm MV A velocity: 100.00 cm/s MV E/A ratio:  0.63 Aditya Sabharwal Electronically signed by Hebert Soho Signature Date/Time: 01/29/2022/4:34:48 PM    Final    CT HEAD WO CONTRAST (5MM)  Result Date: 01/29/2022 CLINICAL DATA:  Altered mental status EXAM: CT HEAD WITHOUT CONTRAST TECHNIQUE: Contiguous axial images were obtained from the base of the skull through the vertex without intravenous contrast. RADIATION DOSE REDUCTION: This exam was performed according to the departmental dose-optimization program which includes automated exposure control, adjustment of the mA and/or  kV according to patient size and/or use of iterative reconstruction technique. COMPARISON:  CT head 01/29/2022.  Brain MRI 01/30/2019 FINDINGS: Brain: Redemonstrated are scattered sites of intracranial calcifications, which appear unchanged compared to prior exam, and may be related to a prior infectious or inflammatory process, or sites of chronic hemorrhage. No evidence of acute hemorrhage. No CT evidence of an infarct. Subtle hypodensity seen in the right parietal cortex, in the region of previously noted infarct on prior brain MRI. Vascular: No hyperdense vessel or unexpected calcification. Skull: Normal. Negative for fracture or focal lesion. Sinuses/Orbits: Bilateral  lens replacements. Other: None. IMPRESSION: No acute intracranial abnormality. Electronically Signed   By: Marin Roberts M.D.   On: 01/29/2022 16:10   MR ANGIO HEAD WO CONTRAST  Result Date: 01/29/2022 CLINICAL DATA:  Left-sided weakness.  Abnormal CTA. EXAM: MRA HEAD WITHOUT CONTRAST TECHNIQUE: Angiographic images of the Circle of Willis were acquired using MRA technique without intravenous contrast. COMPARISON:  CTA of the head and neck 01/29/2022 FINDINGS: Anterior circulation: High cervical segments are within normal limits. Atherosclerotic changes are present within the cavernous ICA bilaterally. Moderate stenosis is present on the right. Mild narrowing is present the left. The ICA termini are within normal limits bilaterally. A high-grade right M1 segment stenosis is present with asymmetric attenuation of distal MCA branch vessels. Mild narrowing is present proximal right A1 segment. ACA branch vessels are within normal limits. Left A1 and M1 segments are within normal limits. MCA bifurcation is normal. Left MCA branch vessels are unremarkable. Posterior circulation: The left vertebral artery is the dominant vessel. The right PICA scratched at the right vertebral artery is centrally terminates at the PICA a with only a very hypoplastic V4 segment. Basilar artery is normal. Both posterior cerebral arteries originate from the basilar tip. Mild right P2 segment stenosis is present. Scattered irregularity is present without other significant stenosis. Anatomic variants: None Other: None. IMPRESSION: 1. Moderate stenosis of the right cavernous ICA. 2. Mild narrowing of the left cavernous ICA. 3. High-grade right M1 segment stenosis with asymmetric attenuation of distal MCA branch vessels. 4. Mild narrowing of the proximal right A1 segment. 5. Mild right P2 segment stenosis. 6. The left vertebral artery is the dominant vessel. Electronically Signed   By: San Morelle M.D.   On: 01/29/2022 13:46   MR  BRAIN WO CONTRAST  Result Date: 01/29/2022 CLINICAL DATA:  Acute onset of left-sided weakness and right-sided gaze with slurred speech. Right M1 occlusion. EXAM: MRI HEAD WITHOUT CONTRAST TECHNIQUE: Multiplanar, multiecho pulse sequences of the brain and surrounding structures were obtained without intravenous contrast. COMPARISON:  CT head and CTA head and neck. FINDINGS: Brain: Mild restricted diffusion is present in the posterior right parietal cortex. No acute infarct is present in the insula or basal ganglia. Subtle FLAIR signal abnormalities associated with the area of restricted diffusion. Mild periventricular T2 hyperintensities are present bilaterally. A remote lacunar infarct is present in the lateral left thalamus. Remote lacunar infarct present in the right corona radiata. The ventricles are of normal size. No significant extraaxial fluid collection is present. The brainstem and cerebellum are within normal limits. The internal auditory canals are within normal limits. Vascular: Normal flow voids are present. Skull and upper cervical spine: The craniocervical junction is normal. Upper cervical spine is within normal limits. Marrow signal is unremarkable. Sinuses/Orbits: The paranasal sinuses and mastoid air cells are clear. Bilateral lens replacements are noted. Globes and orbits are otherwise unremarkable. IMPRESSION: 1. Acute/subacute nonhemorrhagic infarct involving  the posterior right parietal cortex. 2. Remote lacunar infarct of the lateral left thalamus. 3. Remote lacunar infarct of the right corona radiata. 4. No acute infarct of the insula or right basal ganglia 5. Mild periventricular T2 hyperintensities bilaterally are mildly advanced for age. This likely reflects the sequela of chronic microvascular ischemia. The above was relayed via text pager to [place] on 01/29/2022 at 13:41 . Electronically Signed   By: San Morelle M.D.   On: 01/29/2022 13:41    Assessment/Plan: Rt MCA  occlusion as well as near occlusive stenosis of (R)ICA at the bifurcation. S/p angiogram with thrombectomy of M1/M2 and subsequent stent angioplasty. Subsequent finding of CCF requiring 2 telescoping Pipeline flow diverting stents placed across fistula. Finally (R)ICA stent angioplasty.  Hypotension and bradycardia improving. Cont Brilinta 90 mg bid and ASA 81 mg daily Will discuss with Dr. Karenann Cai if/when repeat imaging to be done to reassess CCF, not likely today   LOS: 2 days   I spent a total of 20 minutes in face to face in clinical consultation, greater than 50% of which was counseling/coordinating care for CVA post intervention  Ascencion Dike PA-C 01/31/2022 12:24 PM

## 2022-02-01 ENCOUNTER — Ambulatory Visit: Payer: Medicare Other

## 2022-02-01 ENCOUNTER — Inpatient Hospital Stay (HOSPITAL_COMMUNITY): Payer: Medicare Other

## 2022-02-01 DIAGNOSIS — R001 Bradycardia, unspecified: Secondary | ICD-10-CM | POA: Diagnosis not present

## 2022-02-01 DIAGNOSIS — I639 Cerebral infarction, unspecified: Secondary | ICD-10-CM | POA: Diagnosis not present

## 2022-02-01 MED ORDER — PANTOPRAZOLE 2 MG/ML SUSPENSION
40.0000 mg | Freq: Every day | ORAL | Status: DC
  Administered 2022-02-01 – 2022-02-11 (×11): 40 mg
  Filled 2022-02-01 (×11): qty 20

## 2022-02-01 NOTE — Evaluation (Signed)
Occupational Therapy Evaluation Patient Details Name: Julia Deleon MRN: [id number] DOB: 03-04-1946 Today's Date: 02/01/2022   History of Present Illness Patient is a 76 y/o female who presents on 9/22 with left sided weakness and slurred speech. Found to have Rt M1 occlusion s/p TNK and mechanical thrombectomy 9/22. Developed bradycardia with hypotension in ICU.  PMH includes COPD, back surgery, CKD.   Clinical Impression   Julia Deleon was evaluated s/p the above CVA. She is indep at baseline including driving and working out multiple times a week. Upon evaluation pt demonstrated functional deficits due to L hemibody sensory motor deficits, ROM and strength, impaired cognition, decreased LOA, poor balance, impaired expressive communication and decreased activity tolerance. Overall she required max-total A +2 for bed mobility and sit<>stand transfers at the bed side. Due to deficits listed below she also requires mod A for UB ADLs and max A +2 fro LB ADLs with repetitive cues required. She will benefit from OT acutely. Recommend d/c to AIR for maximal functional recovery and to reduce caregiver burden.      Recommendations for follow up therapy are one component of a multi-disciplinary discharge planning process, led by the attending physician.  Recommendations may be updated based on patient status, additional functional criteria and insurance authorization.   Follow Up Recommendations  Acute inpatient rehab (3hours/day)    Assistance Recommended at Discharge Frequent or constant Supervision/Assistance  Patient can return home with the following A lot of help with walking and/or transfers;A lot of help with bathing/dressing/bathroom;Assistance with cooking/housework;Direct supervision/assist for medications management;Direct supervision/assist for financial management;Assist for transportation;Help with stairs or ramp for entrance    Functional Status Assessment  Patient has had a recent decline in  their functional status and demonstrates the ability to make significant improvements in function in a reasonable and predictable amount of time.  Equipment Recommendations  Other (comment) (pending progression)    Recommendations for Other Services Rehab consult     Precautions / Restrictions Precautions Precautions: Fall;Other (comment) Precaution Comments: watch HR, NGT Restrictions Weight Bearing Restrictions: No      Mobility Bed Mobility Overal bed mobility: Needs Assistance Bed Mobility: Supine to Sit, Sit to Supine Rolling: Max assist   Supine to sit: Max assist, +2 for safety/equipment, +2 for physical assistance Sit to supine: Max assist, +2 for physical assistance, +2 for safety/equipment   General bed mobility comments: assist for L hemibody, cues and LOA    Transfers Overall transfer level: Needs assistance Equipment used: 2 person hand held assist Transfers: Sit to/from Stand Sit to Stand: Total assist, Max assist, +2 physical assistance, +2 safety/equipment           General transfer comment: pt able to assist minimally with RUE and some weightbearing through RUE. Ultimately <25%      Balance Overall balance assessment: Needs assistance Sitting-balance support: Feet supported Sitting balance-Leahy Scale: Fair     Standing balance support: Bilateral upper extremity supported, During functional activity Standing balance-Leahy Scale: Poor                             ADL either performed or assessed with clinical judgement   ADL Overall ADL's : Needs assistance/impaired Eating/Feeding: Maximal assistance;Sitting   Grooming: Minimal assistance;Sitting Grooming Details (indicate cue type and reason): able to groom face adn was aware she was drooling Upper Body Bathing: Moderate assistance;Sitting   Lower Body Bathing: Maximal assistance;+2 for physical assistance;+2 for safety/equipment   Upper  Body Dressing : Moderate  assistance;Sitting   Lower Body Dressing: Maximal assistance;+2 for physical assistance;+2 for safety/equipment;Sit to/from stand   Toilet Transfer: Maximal assistance;+2 for physical assistance;+2 for safety/equipment   Toileting- Clothing Manipulation and Hygiene: Maximal assistance;+2 for physical assistance;+2 for safety/equipment;Sit to/from stand       Functional mobility during ADLs: Maximal assistance;+2 for safety/equipment;+2 for physical assistance General ADL Comments: L hemi, L inattention     Vision Baseline Vision/History: 0 No visual deficits Vision Assessment?: Vision impaired- to be further tested in functional context Additional Comments: L inattention. difficult to fully assess due to LOA     Perception     Praxis      Pertinent Vitals/Pain Pain Assessment Pain Assessment: Faces Faces Pain Scale: Hurts little more Pain Location: back Pain Descriptors / Indicators: Discomfort, Grimacing Pain Intervention(s): Limited activity within patient's tolerance, Monitored during session     Hand Dominance Right   Extremity/Trunk Assessment Upper Extremity Assessment Upper Extremity Assessment: RUE deficits/detail;LUE deficits/detail RUE Deficits / Details: overall WFL for tasks assessned. Pushes through RUE when sitting. Difficult to get full assessment due to LOA LUE Deficits / Details: no activation noted. incrased edema. Responded to noxious sim LUE Sensation: decreased light touch;decreased proprioception LUE Coordination: decreased fine motor;decreased gross motor   Lower Extremity Assessment Lower Extremity Assessment: Defer to PT evaluation   Cervical / Trunk Assessment Cervical / Trunk Assessment: Other exceptions Cervical / Trunk Exceptions: chronic back pain   Communication Communication Communication: Expressive difficulties   Cognition Arousal/Alertness: Lethargic Behavior During Therapy: WFL for tasks assessed/performed Overall Cognitive  Status: Difficult to assess                                 General Comments: Pt lethargic and sleepy during session. Wakes up for moments but then falls back asleep. Oriented x4, following commands when awake.     General Comments  VSS on RA    Exercises     Shoulder Instructions      Home Living Family/patient expects to be discharged to:: Private residence Living Arrangements: Spouse/significant other Available Help at Discharge: Family;Available 24 hours/day Type of Home: House Home Access: Stairs to enter [employer] of Steps: 8 Entrance Stairs-Rails: Right Home Layout: One level     Bathroom Shower/Tub: Teacher, early years/pre: Standard     Home Equipment: Conservation officer, nature (2 wheels);Cane - single point;BSC/3in1;Shower seat          Prior Functioning/Environment Prior Level of Function : Independent/Modified Independent             Mobility Comments: independent, drives. Works out twice weekly with a Clinical research associate ADLs Comments: independent        OT Problem List: Decreased strength;Decreased range of motion;Decreased activity tolerance;Impaired balance (sitting and/or standing);Decreased coordination;Decreased cognition;Decreased knowledge of use of DME or AE;Decreased safety awareness;Impaired sensation;Impaired tone;Pain;Impaired UE functional use      OT Treatment/Interventions: Self-care/ADL training;Therapeutic exercise;DME and/or AE instruction;Therapeutic activities;Patient/family education    OT Goals(Current goals can be found in the care plan section) Acute Rehab OT Goals Patient Stated Goal: per family, back ot indep OT Goal Formulation: With patient Time For Goal Achievement: 02/15/22 Potential to Achieve Goals: Good ADL Goals Pt Will Perform Upper Body Dressing: with min assist;sitting Pt Will Perform Lower Body Dressing: with mod assist;sit to/from stand Pt Will Transfer to Toilet: with mod assist;stand pivot  transfer;bedside commode Additional ADL Goal #1:  Pt will attend to stimuli at midline with minimal cues to participate in ADLs  OT Frequency: Min 2X/week    Co-evaluation PT/OT/SLP Co-Evaluation/Treatment: Yes Reason for Co-Treatment: Complexity of the patient's impairments (multi-system involvement);For patient/therapist safety;To address functional/ADL transfers   OT goals addressed during session: ADL's and self-care      AM-PAC OT "6 Clicks" Daily Activity     Outcome Measure Help from another person eating meals?: A Little Help from another person taking care of personal grooming?: A Little Help from another person toileting, which includes using toliet, bedpan, or urinal?: A Lot Help from another person bathing (including washing, rinsing, drying)?: A Lot Help from another person to put on and taking off regular upper body clothing?: A Lot Help from another person to put on and taking off regular lower body clothing?: A Lot 6 Click Score: 14   End of Session Equipment Utilized During Treatment: Gait belt Nurse Communication: Mobility status  Activity Tolerance: Patient tolerated treatment well Patient left: in bed;with call bell/phone within reach;with bed alarm set;with family/visitor present  OT Visit Diagnosis: Unsteadiness on feet (R26.81);Other abnormalities of gait and mobility (R26.89);Muscle weakness (generalized) (M62.81);Hemiplegia and hemiparesis;Pain Hemiplegia - Right/Left: Left Hemiplegia - dominant/non-dominant: Non-Dominant Hemiplegia - caused by: Cerebral infarction                Time: 6389-3734 OT Time Calculation (min): 36 min Charges:  OT General Charges $OT Visit: 1 Visit OT Evaluation $OT Eval Moderate Complexity: 1 Mod    [place] Capers D Causey 02/01/2022, 10:18 AM

## 2022-02-01 NOTE — Progress Notes (Signed)
Rounding Note    Patient Name: Julia Deleon Date of Encounter: 02/01/2022  [place] Cardiologist: None   Subjective   Somnolent but arousable and follows commands  Inpatient Medications    Scheduled Meds:  aspirin  81 mg Per Tube Daily   Chlorhexidine Gluconate Cloth  6 each Topical Q0600   heparin injection (subcutaneous)  5,000 Units Subcutaneous Q8H   levothyroxine  75 mcg Per Tube Q0600   mouth rinse  15 mL Mouth Rinse 4 times per day   pantoprazole  40 mg Per Tube Daily   sodium chloride flush  10-40 mL Intracatheter Q12H   ticagrelor  90 mg Per Tube BID   topiramate  25 mg Per Tube BID   Continuous Infusions:  sodium chloride Stopped (01/30/22 1037)   sodium chloride 75 mL/hr at 02/01/22 0907   clevidipine     DOPamine Stopped (01/31/22 0748)   norepinephrine (LEVOPHED) Adult infusion Stopped (01/30/22 1414)   sodium chloride     PRN Meds: acetaminophen **OR** acetaminophen (TYLENOL) oral liquid 160 mg/5 mL **OR** acetaminophen, acetaminophen-codeine, hydrALAZINE, mouth rinse, senna-docusate, sodium chloride flush   Vital Signs    Vitals:   02/01/22 0527 02/01/22 0600 02/01/22 0800 02/01/22 0900  BP: (!) 121/48 (!) 132/48 (!) 158/62 (!) 135/48  Pulse: (!) 48 (!) 45 (!) 56 (!) 52  Resp: 12 (!) '9 15 14  '$ Temp:   99.3 F (37.4 C)   TempSrc:   Axillary   SpO2: 94% 97% 96% 97%  Weight:      Height:        Intake/Output Summary (Last 24 hours) at 02/01/2022 1005 Last data filed at 02/01/2022 0600 Gross per 24 hour  Intake 1617.63 ml  Output 1425 ml  Net 192.63 ml       01/29/2022   11:00 AM 07/06/2021    2:15 PM 04/28/2021    2:07 PM  Last 3 Weights  Weight (lbs) 167 lb 15.9 oz 164 lb 4 oz 165 lb  Weight (kg) 76.2 kg 74.503 kg 74.844 kg      Telemetry    Sinus bradycardia with rates 40s to 70- Personally Reviewed  ECG    No new- Personally Reviewed  Physical Exam   GEN: Somnolent but arousable Neck: No JVD Cardiac:  Bradycardic, regular, no murmurs Respiratory: Clear to auscultation bilaterally. GI: Soft, nontender MS: No edema; No deformity. Neuro: Left-sided hemiparesis  Labs    High Sensitivity Troponin:   Recent Labs  Lab 01/30/22 1000 01/30/22 1238  TROPONINIHS 31* 67*      Chemistry Recent Labs  Lab 01/29/22 2338 01/30/22 1000 01/30/22 1105 01/31/22 0417  NA 141 143  --  142  K 3.9 3.7  --  3.8  CL 116* 119*  --  120*  CO2 18* 17*  --  17*  GLUCOSE 135* 104*  --  105*  BUN 18 19  --  18  CREATININE 1.02* 1.22*  --  1.13*  CALCIUM 7.6* 7.5*  --  7.8*  MG  --  2.1 2.1 2.2  PROT 5.0*  --   --   --   ALBUMIN 2.9*  --   --   --   AST 15  --   --   --   ALT 16  --   --   --   ALKPHOS 39  --   --   --   BILITOT 0.7  --   --   --  GFRNONAA 58* 47*  --  51*  ANIONGAP 7 7  --  5     Lipids  Recent Labs  Lab 01/30/22 0639  CHOL 169  TRIG 96  HDL 25*  LDLCALC 125*  CHOLHDL 6.8     Hematology Recent Labs  Lab 01/29/22 1107 01/29/22 1115 01/29/22 1915 01/30/22 0933 01/31/22 0417  WBC 6.7  --   --  11.9* 9.9  RBC 3.89  --   --  3.21* 3.29*  HGB 12.7   < > 10.5* 10.2* 10.3*  HCT 36.8   < > 31.0* 31.0* 30.7*  MCV 94.6  --   --  96.6 93.3  MCH 32.6  --   --  31.8 31.3  MCHC 34.5  --   --  32.9 33.6  RDW 13.0  --   --  13.3 13.7  PLT 196  --   --  186 171   < > = values in this interval not displayed.    Thyroid  Recent Labs  Lab 01/30/22 1000  TSH 1.434     BNPNo results for input(s): "BNP", "PROBNP" in the last 168 hours.  DDimer No results for input(s): "DDIMER" in the last 168 hours.   Radiology    No results found.  Cardiac Studies     Patient Profile     76 y.o. female with a hx of COPD, CKD, hypertension, hypothyroidism who presented with acute CVA and is being seen for the evaluation of bradycardia   Assessment & Plan    Bradycardia: Intermittent sinus bradycardia down to 30s, associated with hypotension on 9/23.  Started on dopamine  with improvement.  Now improved off dopamine.  Remains in sinus rhythm with rates 40-70s off dopamine. Suspect bradycardia is due to carotid intervention and should improve with time.  Echocardiogram shows normal biventricular function, no significant valvular disease.   Acute CVA: Status post TNK and thrombectomy on 9/22.  Left-sided hemiparesis   For questions or updates, please contact [facility] Please consult [email or web address] for contact info under        Signed, Donato Heinz, MD  02/01/2022, 10:05 AM

## 2022-02-01 NOTE — Progress Notes (Signed)
Physical Therapy Treatment Patient Details Name: Julia Deleon MRN: [id number] DOB: Jan 09, 1946 Today's Date: 02/01/2022   History of Present Illness Patient is a 76 y/o female who presents on 9/22 with left sided weakness and slurred speech. Found to have Rt M1 occlusion s/p TNK and mechanical thrombectomy 9/22. Developed bradycardia with hypotension in ICU.  PMH includes COPD, back surgery, CKD.    PT Comments    The pt was agreeable to session, family eager for her to progress OOB mobility. The pt continues to require max-totalA to complete bed mobility and transition to sitting EOB due to lethargy and strength deficits. She was able to assist some with RUE, but needs additional support at trunk, to move BLE, and to scoot hips to EOB. The pt was then able to attempt sit-stand transfer, but required totalA of 2 to block LE, assist at trunk, hips, and correct balance. The pt was unable to progress further gait and standing balance at this time due to fatigue, but did complete multiple sit-stand transfers in the session. Will continue to benefit from skilled PT acutely to progress OOB mobility and strength. Continue to recommend acute inpatient rehab when medically stable for d/c.    Recommendations for follow up therapy are one component of a multi-disciplinary discharge planning process, led by the attending physician.  Recommendations may be updated based on patient status, additional functional criteria and insurance authorization.  Follow Up Recommendations  Acute inpatient rehab (3hours/day)     Assistance Recommended at Discharge Frequent or constant Supervision/Assistance  Patient can return home with the following Two people to help with walking and/or transfers;A lot of help with bathing/dressing/bathroom;Assistance with cooking/housework;Direct supervision/assist for medications management;Assist for transportation;Help with stairs or ramp for entrance   Equipment Recommendations  Other  (comment)    Recommendations for Other Services       Precautions / Restrictions Precautions Precautions: Fall;Other (comment) Precaution Comments: watch HR, NGT Restrictions Weight Bearing Restrictions: No     Mobility  Bed Mobility Overal bed mobility: Needs Assistance Bed Mobility: Supine to Sit, Sit to Supine Rolling: Max assist   Supine to sit: Max assist, +2 for safety/equipment, +2 for physical assistance Sit to supine: Max assist, +2 for physical assistance, +2 for safety/equipment   General bed mobility comments: assist for L hemibody, cues and LOA    Transfers Overall transfer level: Needs assistance Equipment used: 2 person hand held assist Transfers: Sit to/from Stand Sit to Stand: Total assist, Max assist, +2 physical assistance, +2 safety/equipment           General transfer comment: pt able to assist minimally with RUE and some weightbearing through RUE. Ultimately <25%    Ambulation/Gait               General Gait Details: unable due to poor arousal   Modified Rankin (Stroke Patients Only) Modified Rankin (Stroke Patients Only) Pre-Morbid Rankin Score: No significant disability Modified Rankin: Severe disability     Balance Overall balance assessment: Needs assistance Sitting-balance support: Feet supported Sitting balance-Leahy Scale: Fair     Standing balance support: Bilateral upper extremity supported, During functional activity Standing balance-Leahy Scale: Poor                              Cognition Arousal/Alertness: Lethargic Behavior During Therapy: WFL for tasks assessed/performed Overall Cognitive Status: Difficult to assess  General Comments: Pt lethargic and sleepy during session. Wakes up for moments but then falls back asleep. Oriented x4, following commands when awake.        Exercises      General Comments General comments (skin integrity, edema,  etc.): VSS on RA      Pertinent Vitals/Pain Pain Assessment Pain Assessment: Faces Faces Pain Scale: Hurts little more Pain Location: back Pain Descriptors / Indicators: Discomfort, Grimacing Pain Intervention(s): Monitored during session, Repositioned    Home Living Family/patient expects to be discharged to:: Private residence Living Arrangements: Spouse/significant other Available Help at Discharge: Family;Available 24 hours/day Type of Home: House Home Access: Stairs to enter Entrance Stairs-Rails: Right Entrance Stairs-Number of Steps: 8   Home Layout: One level Home Equipment: Conservation officer, nature (2 wheels);Cane - single point;BSC/3in1;Shower seat          PT Goals (current goals can now be found in the care plan section) Acute Rehab PT Goals Patient Stated Goal: none stated PT Goal Formulation: Patient unable to participate in goal setting Time For Goal Achievement: 02/14/22 Potential to Achieve Goals: Fair Progress towards PT goals: Progressing toward goals    Frequency    Min 4X/week      PT Plan Current plan remains appropriate    Co-evaluation PT/OT/SLP Co-Evaluation/Treatment: Yes Reason for Co-Treatment: Complexity of the patient's impairments (multi-system involvement);Necessary to address cognition/behavior during functional activity;For patient/therapist safety;To address functional/ADL transfers PT goals addressed during session: Mobility/safety with mobility;Balance;Strengthening/ROM OT goals addressed during session: ADL's and self-care      AM-PAC PT "6 Clicks" Mobility   Outcome Measure  Help needed turning from your back to your side while in a flat bed without using bedrails?: Total Help needed moving from lying on your back to sitting on the side of a flat bed without using bedrails?: Total Help needed moving to and from a bed to a chair (including a wheelchair)?: Total Help needed standing up from a chair using your arms (e.g., wheelchair  or bedside chair)?: Total Help needed to walk in hospital room?: Total Help needed climbing 3-5 steps with a railing? : Total 6 Click Score: 6    End of Session Equipment Utilized During Treatment: Gait belt Activity Tolerance: Patient limited by lethargy Patient left: in bed;with call bell/phone within reach;with bed alarm set Nurse Communication: Mobility status PT Visit Diagnosis: Hemiplegia and hemiparesis;Difficulty in walking, not elsewhere classified (R26.2);Unsteadiness on feet (R26.81) Hemiplegia - Right/Left: Left Hemiplegia - dominant/non-dominant: Non-dominant Hemiplegia - caused by: Cerebral infarction     Time: 8333-8329 PT Time Calculation (min) (ACUTE ONLY): 34 min  Charges:  $Therapeutic Activity: 8-22 mins                     Blasingame Carbo, PT, DPT   Acute Rehabilitation Department   Sandra Cockayne 02/01/2022, 11:58 AM

## 2022-02-01 NOTE — Evaluation (Signed)
Clinical/Bedside Swallow Evaluation Patient Details  Name: Julia Deleon MRN: [id number] Date of Birth: [birth year]  Today's Date: 02/01/2022 Time: SLP Start Time (ACUTE ONLY): 0944 SLP Stop Time (ACUTE ONLY): 65 SLP Time Calculation (min) (ACUTE ONLY): 18 min  Past Medical History:  Past Medical History:  Diagnosis Date   Arthritis    Chronic kidney disease    COPD (chronic obstructive pulmonary disease) ([place])    pt denies    Dyspnea    Family history of adverse reaction to anesthesia    father was slow to wake up   Fuch's endothelial dystrophy    GERD (gastroesophageal reflux disease)    Hyperlipidemia    Hypertension    Hypothyroidism    Pneumonia    hx of    PONV (postoperative nausea and vomiting)    slow to wake up and PONV   Past Surgical History:  Past Surgical History:  Procedure Laterality Date   ABDOMINAL HYSTERECTOMY     APPENDECTOMY     BACK SURGERY  09/26/2018   L4-5 PLIF by Dr. Arnoldo Morale   CARDIAC CATHETERIZATION     CHOLECYSTECTOMY     COLONOSCOPY WITH PROPOFOL N/A 10/23/2020   Procedure: COLONOSCOPY WITH PROPOFOL;  Surgeon: Virgel Manifold, MD;  Location: [facility] ENDOSCOPY;  Service: Endoscopy;  Laterality: N/A;   ESOPHAGOGASTRODUODENOSCOPY N/A 10/23/2020   Procedure: ESOPHAGOGASTRODUODENOSCOPY (EGD);  Surgeon: Virgel Manifold, MD;  Location: [facility] ENDOSCOPY;  Service: Endoscopy;  Laterality: N/A;   EYE SURGERY     FOOT SURGERY Right    pin removed left   laser vein surgery     NASAL SINUS SURGERY     RADIOLOGY WITH ANESTHESIA N/A 01/29/2022   Procedure: IR WITH ANESTHESIA;  Surgeon: Radiologist, Medication, MD;  Location: [place];  Service: Radiology;  Laterality: N/A;   RIGHT/LEFT HEART CATH AND CORONARY ANGIOGRAPHY N/A 12/06/2017   Procedure: RIGHT/LEFT HEART CATH AND CORONARY ANGIOGRAPHY;  Surgeon: Yolonda Kida, MD;  Location: [place] CV LAB;  Service: Cardiovascular;  Laterality: N/A;   ROBOTIC ASSISTED LAPAROSCOPIC SACROCOLPOPEXY  Bilateral 07/30/2020   Procedure: XI ROBOTIC ASSISTED LAPAROSCOPIC SACROCOLPOPEXY AND SUPRACERVICAL HYSTERECTOMY WITH BILATERAL SALPINGO OOPHERECTOMY;  Surgeon: Ardis Hughs, MD;  Location: [facility] ORS;  Service: Urology;  Laterality: Bilateral;  REQUESTING 4 HRS   TONSILLECTOMY     HPI:  76 year old female patient who was admitted on 9/22 for acute onset left-sided weakness, left facial droop, dysarthria and neglect. CT angiogram of head and neck showed acute occlusion of proximal right M1 and nonopacification of right M2/M3, severe stenosis at right ICA. Pt now s/p TNK and thrombectomy. Bradycardia and hypotention noted post op.    Assessment / Plan / Recommendation  Clinical Impression  Pt presents with a moderate oral dysphagia and clinical indicators of pharyngeal dysphagia. Pt continues to be very lethargic, but roused briefly to participate in evaluation.  SLP provided oral care prior to administration of PO trials.  There were dried secretion in oral cavity.  Pt with oral cavity open and breathing through mouth during sleep on arrival.  Recommend continuing regular oral care.  Pt did not exhibit oral response to ice chip which was removed by digital sweep.  With water by spoon, pt needed tactile cuing/assistance to close mouth. No pharyngeal swallow reflex was palpated on 1st bolus and suction was used to clear oral cavity.  Slightly larger bolus of thin liquid was given by spoon, to improve bolus awarenes.  Pt coughed on 2 of 3 trials of  thin liquid by spoon.  Pt benefited from verbal and tactile cues to swallow. There was anterior loss, to which pt was sensate.  At time time, pt would benefit from instrumental evaluation prior to initiation of PO diet, but is unable to participate in further assessment at this time. Pt family and RN report that pt is requesting water.  Provided education to family regarding free water protocol.  SLP will follow for readiness for instrumental evaluation v PO  readiness.     Recommend pt remain NPO with alternate means of nutrition, hydration and medication.     Pt may have small amounts of unthickened water only after good oral care, in moderation, when fully awake/alert, with upright positioning and 1:1 assistance.     Cognitive-linguistic evaluation pending as pt is unable to participate at this time.  Some spontaneous speech was noted during swallow assessment.  Pt's speech was intelligible, grammatically, appropriate, and relevant.  SLP Visit Diagnosis: Dysphagia, oropharyngeal phase (R13.12)    Aspiration Risk  Moderate aspiration risk;Severe aspiration risk    Diet Recommendation NPO   Medication Administration: Via alternative means    Other  Recommendations Oral Care Recommendations: Oral care QID    Recommendations for follow up therapy are one component of a multi-disciplinary discharge planning process, led by the attending physician.  Recommendations may be updated based on patient status, additional functional criteria and insurance authorization.  Follow up Recommendations  (TBD, continue ST at next level of care)      Assistance Recommended at Discharge Frequent or constant Supervision/Assistance  Functional Status Assessment Patient has not had a recent decline in their functional status  Frequency and Duration min 2x/week  2 weeks       Prognosis Prognosis for Safe Diet Advancement: Good      Swallow Study   General Date of Onset: 01/29/22 HPI: 76 year old female patient who was admitted on 9/22 for acute onset left-sided weakness, left facial droop, dysarthria and neglect. CT angiogram of head and neck showed acute occlusion of proximal right M1 and nonopacification of right M2/M3, severe stenosis at right ICA. Pt now s/p TNK and thrombectomy. Bradycardia and hypotention noted post op. Type of Study: Bedside Swallow Evaluation Previous Swallow Assessment: none Diet Prior to this Study: NPO Temperature Spikes  Noted: No Respiratory Status: Nasal cannula History of Recent Intubation: Yes Length of Intubations (days):  (procedure only) Behavior/Cognition: Lethargic/Drowsy Oral Cavity Assessment: Dry;Dried secretions Oral Care Completed by SLP: Yes Oral Cavity - Dentition: Adequate natural dentition Self-Feeding Abilities: Total assist Patient Positioning: Upright in bed Baseline Vocal Quality: Normal Volitional Cough: Cognitively unable to elicit Volitional Swallow: Unable to elicit    Oral/Motor/Sensory Function Overall Oral Motor/Sensory Function:  (unable to assess) Facial Symmetry: Abnormal symmetry left   Ice Chips Ice chips: Impaired Oral Phase Impairments: Poor awareness of bolus;Reduced lingual movement/coordination;Reduced labial seal   Thin Liquid Thin Liquid: Impaired Presentation: Spoon Oral Phase Impairments: Poor awareness of bolus;Reduced labial seal Oral Phase Functional Implications: Right anterior spillage;Oral holding Pharyngeal  Phase Impairments: Cough - Immediate    Nectar Thick Nectar Thick Liquid: Not tested   Honey Thick Honey Thick Liquid: Not tested   Puree Puree: Not tested   Solid     Solid: Not tested      Julia Savage, MA, [place] Office: [phone] 02/01/2022,10:24 AM

## 2022-02-01 NOTE — Progress Notes (Signed)
NAME:  Yuliana C Bouwman, MRN:  [id number], DOB:  1946/01/18, LOS: 3 ADMISSION DATE:  01/29/2022, CONSULTATION DATE: 9/23 REFERRING MD:  Leonie Man, CHIEF COMPLAINT: Hypotension and bradycardia  History of Present Illness:  76 year old female patient who was admitted on 9/27 for acute onset left-sided weakness, left facial droop, dysarthria and neglect.  Symptom onset 1020 in the morning while working out.  Code stroke called on arrival to ER.  CT angiogram of head and neck showed acute occlusion of proximal right M1 and nonopacification of right M2/M3, severe stenosis at right ICA. received TNK in the emergency room.  She rapidly improved with NIH scale changing from 12 down to 5.  Follow-up MRI continues to show moderate stenosis of the right ICA narrowing of the left ICA high-grade M1 stenosis of the distal MCA and because of this she went to interventional radiology where she underwent mechanical thrombectomy with direct clot aspiration.  She was extubated in the IR suite and admitted to neuro ICU for further evaluation and postop critical care. Postop day 1 while in the intensive care noted to have asymptomatic bradycardia, initially blood pressure normal, then later in the morning hours on 9/23 having worsening hypotension.  Pulmonary asked to evaluate for ongoing bradycardia and hypotension.  Pertinent  Medical History  CKD, COPD, chronic dyspnea adverse reaction to anesthesia, Fuch's endothelial dystrophy, GERD, hyperlipidemia, hypertension, hypothyroidism, postoperative nausea and vomiting   Significant Hospital Events: Including procedures, antibiotic start and stop dates in addition to other pertinent events   9/22 admitted with acute right MCA territory stroke received TNK and underwent mechanical thrombectomy and intracranial stenting with also stenting of the cervical carotid, continued to have postoperative left-sided hemiparesis, neglect and left facial droop.  Echocardiogram EF 60 to 65% normal  LV function no diastolic dysfunction 6/30 having persistent bradycardia and intermittent hypotension critical care consulted, started initially on norepi, then transitioned to Dopamine. Asked cards to see. Rec obs/right upper extremity PICC line placed 9/24: A little more awake.  Off dopamine  Interim History / Subjective:  Still somnolent, but will follow commands and communicate appropriately.  Still no movement on left.   Objective   Blood pressure (!) 135/48, pulse (!) 52, temperature 99.5 F (37.5 C), temperature source Axillary, resp. rate 14, height '5\' 4"'$  (1.626 m), weight 76.2 kg, SpO2 97 %.        Intake/Output Summary (Last 24 hours) at 02/01/2022 1307 Last data filed at 02/01/2022 0600 Gross per 24 hour  Intake 1279.26 ml  Output 1025 ml  Net 254.26 ml    Filed Weights   01/29/22 1100  Weight: 76.2 kg    Examination: General 76 year old female patient poststroke lying in bed HEENT normocephalic atraumatic no jugular venous distention appreciated still has left-sided facial droop Pulmonary clear to auscultation  Cardiac: In sinus rhythm in the 70's off all vasoactive infusions. Neuro: Awake, slow to open eyes.  Oriented x3.  Normal strength on right, left-sided weakness, seemingly a little bit stronger in left upper extremity today GU clear yellow  Resolved Hospital Problem list     Assessment & Plan:   Acute stroke, right MCA territory.  Now status post TNK and thrombectomy 9/22 with residual left sided weakness and neglect  -Serial neurochecks  -Continuing aspirin, holding Brilinta until follow-up MRI timing of MRI to be determined by neuro and interventional radiology team  -Systolic blood pressure: 160-109  -Decrease frequency of neurochecks to allow for more restorative sleep.  Intermittent bradycardia and hypotension  History of underlying coronary artery disease EF 60 to 65% with normal LV function and no wall motion abnormality 9/22 - Now resolved.  -  Ready to transfer to critical care standpoint.  History of CKD stage III.  Serum creatinine stable Plan Serial chemistries Renal dose medications  Fluid and electrolyte imbalance: None anion gap metabolic acidosis secondary to hyperchloremia Plan Decrease normal saline once taking p.o. Daily Labs  History of hypothyroidism Plan Synthroid  PCCM will sign off.  Best Practice (right click and "Reselect all SmartList Selections" daily)   Diet/type: NPO w/ meds via tube DVT prophylaxis: SCD GI prophylaxis: N/A Lines: N/A Foley:  Yes, and it is still needed Code Status:  full code Last date of multidisciplinary goals of care discussion [pending]  Kipp Brood, MD [facility] ICU Physician [place]  Pager: [phone] Or Epic Secure Chat After hours: [phone].  02/01/2022, 1:15 PM

## 2022-02-01 NOTE — Progress Notes (Addendum)
STROKE TEAM PROGRESS NOTE   INTERVAL HISTORY Her husband and son are at the bedside.   She is doing hemodynamically better and is off all pressors now.. She remains neurologically unchanged and arouses to voice and is oriented with some moderate dysarthria.    Vitals:   02/01/22 0900 02/01/22 1200 02/01/22 1300 02/01/22 1400  BP: (!) 135/48 (!) 162/51 (!) 154/54   Pulse: (!) 52 (!) 54 66 75  Resp: '14 11 20 '$ (!) 30  Temp:  99.5 F (37.5 C)    TempSrc:  Axillary    SpO2: 97% 97% 96% 97%  Weight:      Height:       CBC:  Recent Labs  Lab 01/29/22 1107 01/29/22 1115 01/30/22 0933 01/31/22 0417  WBC 6.7  --  11.9* 9.9  NEUTROABS 3.9  --   --   --   HGB 12.7   < > 10.2* 10.3*  HCT 36.8   < > 31.0* 30.7*  MCV 94.6  --  96.6 93.3  PLT 196  --  186 171   < > = values in this interval not displayed.   Basic Metabolic Panel:  Recent Labs  Lab 01/30/22 1000 01/30/22 1105 01/31/22 0417  NA 143  --  142  K 3.7  --  3.8  CL 119*  --  120*  CO2 17*  --  17*  GLUCOSE 104*  --  105*  BUN 19  --  18  CREATININE 1.22*  --  1.13*  CALCIUM 7.5*  --  7.8*  MG 2.1 2.1 2.2  PHOS  --   --  2.1*   Lipid Panel:  Recent Labs  Lab 01/30/22 0639  CHOL 169  TRIG 96  HDL 25*  CHOLHDL 6.8  VLDL 19  LDLCALC 125*   HgbA1c:  Recent Labs  Lab 01/29/22 1107  HGBA1C 5.4   Urine Drug Screen:  Recent Labs  Lab 01/29/22 2215  LABOPIA NONE DETECTED  COCAINSCRNUR NONE DETECTED  LABBENZ NONE DETECTED  AMPHETMU NONE DETECTED  THCU NONE DETECTED  LABBARB NONE DETECTED    Alcohol Level  Recent Labs  Lab 01/29/22 1107  ETH <10    IMAGING past 24 hours No results found.  Physical Exam  Constitutional: Appears well-developed and well-nourished elderly Caucasian lady.   Cardiovascular: Normal rate and regular rhythm.  Respiratory: Effort normal, non-labored breathing  Neuro: Mental Status: Patient is drowsy, but, oriented to person, place, month, year, and situation Right  gaze preference, comes to midline. Follows commands speech is hesitant with some dysarthria but at times gibberish difficult to understand Cranial Nerves: II: Right gaze preference, unable to look to the left III,IV, VI: EOMI without ptosis or diploplia..  Blinks to threat on the right and not on the left. V: Facial sensation is symmetric to temperature VII: Right facial droop  VIII: Hearing is intact to voice X: voice is hypophonic  XI: Shoulder shrug is symmetric. XII: Tongue protrudes midline without atrophy or fasciculations.  Motor: Tone is normal. Bulk is normal.  Right full strength LLE 2/5 LUE 0/5 Sensory: No sensory deficit noted in any extremity  Cerebellar: FNF and HKS are intact bilaterally   ASSESSMENT/PLAN Ms. Geralda C Bradshaw is a 76 y.o. female with history of arthritis, CKD, COPD, Fuch's corneal endothelial dystrophy, HLD, HTN and hypothyroidism who presents to the ED via EMS as a Code Stroke with acute onset of left sided weakness, left facial droop and dysarthria with neglect. Initially  symptoms improved in IR suite after TNK and she was taken back to her ED room. She then had a change in neurological exam and was reactivated as a Code IR.  Stroke: Right MCA infarct s/p TNK and mechanical thrombectomy with TICI2c revascularization  Etiology:  atherosclerosis and stenosis of intracranial vessels   Code Stroke CT head Loss of gray-white differentiation in the posterior right insular cortex and right lentiform nucleus compatible with acute/subacute right MCA infarct. Aspects 8/10 CTA head & neck Acute occlusion of the proximal right M1. Severe stenosis at the origin of the right ICA (>75%). Repeat CT Head Sequelae of interval stenting of the right ICA and MCA. Scattered hyper density involving the right anterior and medial frontal region as well as the right sylvian fissure, similar to prior intra procedural CT. While this finding almost certainly reflects a degree of contrast  staining, superimposed subarachnoid hemorrhage at the right sylvian fissure not excluded. Underlying evolving right MCA territory infarct. Post IR CT contrast staining in the basal ganglia and right medial frontal cortex as well as trace hyperdensity in the right sylvian fissure. MRI Pre procedure- Acute/subacute nonhemorrhagic infarct involving the posterior right parietal cortex  MRA  Pre procedure 1. Moderate stenosis of the right cavernous ICA. 2. Mild narrowing of the left cavernous ICA. 3. High-grade right M1 segment stenosis with asymmetric attenuation of distal MCA branch vessels. 4. Mild narrowing of the proximal right A1 segment. 5. Mild right P2 segment stenosis. 6. The left vertebral artery is the dominant vessel. 2D Echo EF 55-60% LDL 125 HgbA1c 5.4 VTE prophylaxis - SCDs    Diet   Diet NPO time specified   No antithrombotic prior to admission, now on aspirin 81 mg daily and Brilinta (ticagrelor) 90 mg bid.  Therapy recommendations:  Pending Disposition:  Pending  Mechanical Thrombectomy with cervical carotid angioplasty and stent placement Mechanical thrombectomy performed with direct contact aspiration with recanalization of the M1 segment with residual atherosclerotic stenosis. Mechanical thrombectomy performed with stent retriever with recanalization of M2  (TICI 2C).  [facility] post procedure- IR following for stabilization so this can be coiled once she is stabilized  Hx of hypertension now hypotensive with bradycardia requiring vasopressors Home meds:  Losartan Unstable BP goal less than 160 Dopamine off Norepinephrine off  Hyperlipidemia Home meds:  None, LDL 125, goal < 70 add when able to take PO- reports allergy to simvastatin  Dysphagia NG tube in place  SLP following   Other Stroke Risk Factors Advanced Age >/= 59   Other Active Problems CKD Stage 3 Cr and GFR stable  Intermittent bradycardia with hypotension Now on dopamine Cardiology  consulted CCM consulted Hold antihypertensive medications PICC line placed for vasopressors  Headache Added tylenol with codeine PRN Topamax '25mg'$  BID COPD Not in acute exacerbation   Hospital day # 3   Patient is hemodynamically better today and is off pressor support.  Continue mobilization out of bed and therapy consults.  Appreciate cardiology and critical care help.  Long discussion at bedside with patient husband and son and answered questions.  Recommend mobilize out of bed.  Therapy consults.  Will consider transfer out of the ICU soon if bed needed.  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  Plan to repeat Ct head wo tomorrow am.Discussed with [facility] interventional neuroradiology.  Discussed with critical care team.This patient is critically ill and at significant risk of neurological worsening, death  and care requires constant monitoring of vital signs, hemodynamics,respiratory and cardiac monitoring, extensive review of multiple databases, frequent neurological assessment, discussion with family, other specialists and medical decision making of high complexity.I have made any additions or clarifications directly to the above note.This critical care time does not reflect procedure time, or teaching time or supervisory time of PA/NP/Med Resident etc but could involve care discussion time.  I spent 30 minutes of neurocritical care time  in the care of  this patient.      Antony Contras, MD Medical Director [place] Pager: [phone] 02/01/2022 4:48 PM    To contact Stroke Continuity provider, please refer to [email or web address] After hours, contact General Neurology

## 2022-02-01 NOTE — Progress Notes (Signed)
Chief Complaint: Patient was seen today for follow up CVA with intervention   Supervising Physician: Pedro Earls  Patient Status: [facility] - In-pt  Subjective: Rt MCA occlusion as well as near occlusive stenosis of (R)ICA at the bifurcation. S/p angiogram with thrombectomy of M1/M2 and subsequent stent angioplasty. Subsequent finding of CCF requiring 2 telescoping Pipeline flow diverting stents placed across fistula. Finally (R)ICA stent angioplasty. Pt admitted to Neuro ICU  Bradycardia and hypotension improved  Family at bedside.   Objective: Physical Exam: BP (!) 132/48   Pulse (!) 45   Temp 99.3 F (37.4 C) (Axillary)   Resp (!) 9   Ht '5\' 4"'$  (1.626 m)   Wt 167 lb 15.9 oz (76.2 kg)   LMP  (LMP Unknown)   SpO2 97%   BMI 28.84 kg/m  Resting in bed. Somnolent but arousable No obvious right sided proptosis, eyelid edema, conjunctival arterialization, pulsatile exophthalmos seen Still with left sided weakness. (R)groin soft, NT   Current Facility-Administered Medications:    0.9 %  sodium chloride infusion, 250 mL, Intravenous, Continuous, Erick Colace, NP, Held at 01/30/22 1037   0.9 %  sodium chloride infusion, , Intravenous, Continuous, Erick Colace, NP, Last Rate: 75 mL/hr at 02/01/22 0907, New Bag at 02/01/22 0907   acetaminophen (TYLENOL) tablet 650 mg, 650 mg, Oral, Q4H PRN **OR** acetaminophen (TYLENOL) 160 MG/5ML solution 650 mg, 650 mg, Per Tube, Q4H PRN, 650 mg at 02/01/22 0918 **OR** acetaminophen (TYLENOL) suppository 650 mg, 650 mg, Rectal, Q4H PRN, de Sindy Messing, Erven Colla, MD, 650 mg at 01/29/22 2121   acetaminophen-codeine (TYLENOL #3) 300-30 MG per tablet 2 tablet, 2 tablet, Per Tube, Q6H PRN, Henri Medal, RPH, 2 tablet at 01/31/22 1631   aspirin chewable tablet 81 mg, 81 mg, Per Tube, Daily, Henri Medal, RPH, 81 mg at 02/01/22 0906   Chlorhexidine Gluconate Cloth 2 % PADS 6 each, 6 each, Topical, Q0600, Kerney Elbe,  MD, 6 each at 02/01/22 0912   clevidipine (CLEVIPREX) infusion 0.5 mg/mL, 0-21 mg/hr, Intravenous, Continuous, de Sindy Messing, [place], MD   DOPamine (INTROPIN) 800 mg in dextrose 5 % 250 mL (3.2 mg/mL) infusion, 0-20 mcg/kg/min, Intravenous, Titrated, Erick Colace, NP, Stopped at 01/31/22 0748   heparin injection 5,000 Units, 5,000 Units, Subcutaneous, Q8H, Shafer, Devon, NP, 5,000 Units at 02/01/22 2355   hydrALAZINE (APRESOLINE) injection 10 mg, 10 mg, Intravenous, Q4H PRN, Garvin Fila, MD   levothyroxine (SYNTHROID) tablet 75 mcg, 75 mcg, Per Tube, Q0600, de Sindy Messing, Erven Colla, MD, 75 mcg at 02/01/22 0527   norepinephrine (LEVOPHED) '4mg'$  in 226m (0.016 mg/mL) premix infusion, 0-40 mcg/min, Intravenous, Titrated, BHenri Medal RPH, Stopped at 01/30/22 1414   Oral care mouth rinse, 15 mL, Mouth Rinse, 4 times per day, SJanine Ores NP, 15 mL at 02/01/22 07322  Oral care mouth rinse, 15 mL, Mouth Rinse, PRN, SJanine Ores NP   pantoprazole (PROTONIX) 2 mg/mL oral suspension 40 mg, 40 mg, Per Tube, Daily, Bell, Lorin C, RPH, 40 mg at 02/01/22 0912   senna-docusate (Senokot-S) tablet 1 tablet, 1 tablet, Oral, QHS PRN, LKerney Elbe MD   sodium chloride 0.9 % bolus 1,000 mL, 1,000 mL, Intravenous, Once, LKerney Elbe MD   sodium chloride flush (NS) 0.9 % injection 10-40 mL, 10-40 mL, Intracatheter, Q12H, SLeonie Man Pramod S, MD, 10 mL at 02/01/22 0914   sodium chloride flush (NS) 0.9 % injection 10-40 mL, 10-40 mL, Intracatheter, PRN, SLeonie Man Pramod S,  MD   ticagrelor (BRILINTA) tablet 90 mg, 90 mg, Per Tube, BID, Ascencion Dike, PA-C, 90 mg at 02/01/22 7353   topiramate (TOPAMAX) tablet 25 mg, 25 mg, Per Tube, BID, Charlean Merl, [place], NP, 25 mg at 02/01/22 2992  Facility-Administered Medications Ordered in Other Encounters:    sodium phosphate (FLEET) 7-19 GM/118ML enema 1 enema, 1 enema, Rectal, Once, Ardis Hughs, MD  Labs: CBC Recent Labs    01/30/22 0933  01/31/22 0417  WBC 11.9* 9.9  HGB 10.2* 10.3*  HCT 31.0* 30.7*  PLT 186 171   BMET Recent Labs    01/30/22 1000 01/31/22 0417  NA 143 142  K 3.7 3.8  CL 119* 120*  CO2 17* 17*  GLUCOSE 104* 105*  BUN 19 18  CREATININE 1.22* 1.13*  CALCIUM 7.5* 7.8*   LFT Recent Labs    01/29/22 2338  PROT 5.0*  ALBUMIN 2.9*  AST 15  ALT 16  ALKPHOS 39  BILITOT 0.7   PT/INR Recent Labs    01/29/22 1107  LABPROT 13.9  INR 1.1     Studies/Results: [place] EKG SITE RITE  Result Date: 01/30/2022 If Site Rite image not attached, placement could not be confirmed due to current cardiac rhythm.   Assessment/Plan: Rt MCA occlusion as well as near occlusive stenosis of (R)ICA at the bifurcation. S/p angiogram with thrombectomy of M1/M2 and subsequent stent angioplasty. Subsequent finding of CCF requiring 2 telescoping Pipeline flow diverting stents placed across fistula. Finally (R)ICA stent angioplasty.  Hypotension and bradycardia improving. Cont Brilinta 90 mg bid and ASA 81 mg daily No apparent signs or symptoms related to CCF but suspect this is still patent given ongoing dual antiplatelet therapy Treatment options discussed with family. Will discuss with neuro and cardiology regarding safety and timing of proceeding with MRI first, and then possible subsequent intervention planning.   LOS: 3 days   I spent a total of 20 minutes in face to face in clinical consultation, greater than 50% of which was counseling/coordinating care for CVA post intervention  Ascencion Dike PA-C 02/01/2022 9:29 AM

## 2022-02-01 NOTE — Progress Notes (Signed)
At 2130 this RN entered room to find that patient had pulled out her NG tube. This RN placed another NG tube and measured external length of tube to be at 41cm. STAT x-ray placed to confirm placement and report recommended to advance tube 12cm. Patient able tolerate advancement of NG tube by this RN by 8cm. External length of tube now measuring 33cm.   Care ongoing.  Wyn Quaker, RN

## 2022-02-02 ENCOUNTER — Inpatient Hospital Stay (HOSPITAL_COMMUNITY): Payer: Medicare Other

## 2022-02-02 ENCOUNTER — Ambulatory Visit: Payer: Medicare Other

## 2022-02-02 DIAGNOSIS — R001 Bradycardia, unspecified: Secondary | ICD-10-CM | POA: Diagnosis not present

## 2022-02-02 DIAGNOSIS — I639 Cerebral infarction, unspecified: Secondary | ICD-10-CM | POA: Diagnosis not present

## 2022-02-02 LAB — PHOSPHORUS: Phosphorus: 2.1 mg/dL — ABNORMAL LOW (ref 2.5–4.6)

## 2022-02-02 LAB — MAGNESIUM: Magnesium: 2.2 mg/dL (ref 1.7–2.4)

## 2022-02-02 LAB — GLUCOSE, CAPILLARY
Glucose-Capillary: 92 mg/dL (ref 70–99)
Glucose-Capillary: 163 mg/dL — ABNORMAL HIGH (ref 70–99)
Glucose-Capillary: 151 mg/dL — ABNORMAL HIGH (ref 70–99)

## 2022-02-02 MED ORDER — SENNOSIDES-DOCUSATE SODIUM 8.6-50 MG PO TABS
1.0000 | ORAL_TABLET | Freq: Two times a day (BID) | ORAL | Status: DC
  Administered 2022-02-02 – 2022-02-09 (×12): 1
  Filled 2022-02-02 (×15): qty 1

## 2022-02-02 MED ORDER — SENNOSIDES-DOCUSATE SODIUM 8.6-50 MG PO TABS: 1.0000 | ORAL_TABLET | Freq: Two times a day (BID) | ORAL | Status: DC

## 2022-02-02 MED ORDER — OSMOLITE 1.5 CAL PO LIQD
1000.0000 mL | ORAL | Status: DC
  Administered 2022-02-02 – 2022-02-05 (×4): 1000 mL

## 2022-02-02 NOTE — Progress Notes (Signed)
Physical Therapy Treatment Patient Details Name: Julia Deleon Leisure MRN: [id number] DOB: Sep 11, 1945 Today's Date: 02/02/2022   History of Present Illness Patient is a 76 y/o female who presents on 9/22 with left sided weakness and slurred speech. Found to have Rt M1 occlusion s/p TNK and mechanical thrombectomy 9/22. Developed bradycardia with hypotension in ICU.  PMH includes COPD, back surgery, CKD.    PT Comments    Patient slow to progress due to lethargy and arousal. Continues to require Max A of 2 for bed mobility and assist for static sitting balance. Pt with right gaze preference and left inattention. Perseverating on wanting to make a list for things to get out of her closet at home needing redirection. Without stimulus, pt falling back asleep quickly. Worked on attending to left environment and left hemibody as well as gaze. Able to get to midline x3 with max cues but not sustain. Worked on activating core musculature sitting EOB and cervical AROM. Continues to have left hemiplegia, left inattention, impaired balance and impaired arousal/attention. Will follow.   Recommendations for follow up therapy are one component of a multi-disciplinary discharge planning process, led by the attending physician.  Recommendations may be updated based on patient status, additional functional criteria and insurance authorization.  Follow Up Recommendations  Acute inpatient rehab (3hours/day)     Assistance Recommended at Discharge Frequent or constant Supervision/Assistance  Patient can return home with the following Two people to help with walking and/or transfers;A lot of help with bathing/dressing/bathroom;Assistance with cooking/housework;Direct supervision/assist for medications management;Assist for transportation;Help with stairs or ramp for entrance   Equipment Recommendations  Other (comment) (defer to next venue)    Recommendations for Other Services       Precautions / Restrictions  Precautions Precautions: Fall;Other (comment) Precaution Comments: watch HR, NGT Restrictions Weight Bearing Restrictions: No     Mobility  Bed Mobility Overal bed mobility: Needs Assistance Bed Mobility: Supine to Sit     Supine to sit: Max assist, +2 for safety/equipment, +2 for physical assistance Sit to supine: Max assist, +2 for physical assistance, +2 for safety/equipment   General bed mobility comments: assist for L hemibody and trunk. RUE pulling up on rail.    Transfers                   General transfer comment: Deferred due to arousal.    Ambulation/Gait                   Stairs             Wheelchair Mobility    Modified Rankin (Stroke Patients Only) Modified Rankin (Stroke Patients Only) Pre-Morbid Rankin Score: No significant disability Modified Rankin: Severe disability     Balance Overall balance assessment: Needs assistance Sitting-balance support: Feet supported, Single extremity supported Sitting balance-Leahy Scale: Poor Sitting balance - Comments: Requires Mod A for sitting balance with posterior lean. Worked on activating abdominals with anterior weight shifts and lateral weight shifts pushing through LUE with assist. Worked ona ttending to left environment and left side of body.                                    Cognition Arousal/Alertness: Lethargic Behavior During Therapy: Flat affect Overall Cognitive Status: Impaired/Different from baseline Area of Impairment: Attention, Memory, Following commands, Safety/judgement, Problem solving  Current Attention Level: Focused Memory: Decreased short-term memory Following Commands: Follows one step commands consistently, Follows one step commands with increased time Safety/Judgement: Decreased awareness of deficits, Decreased awareness of safety   Problem Solving: Slow processing, Requires verbal cues General Comments: Pt lethargic  and sleepy during session. Wakes up for moments but then falls back asleep. Oriented x4 but perseverating on wanting to make a list and get things out of her closet. Following commands when awake. Poor attention due to arousal. right gaze preference and left inattention.        Exercises Other Exercises Other Exercises: Cervical AROM x10 with hold at end range towards left, able to get to midline a few times but not sustain    General Comments General comments (skin integrity, edema, etc.): Son and spouse present during session. Lengthy discussion with family about how they can help patient- being onl eft side, doing PROM of LUE/LLE, reorienting her as needed etc      Pertinent Vitals/Pain Pain Assessment Pain Assessment: Faces Faces Pain Scale: No hurt    Home Living                          Prior Function            PT Goals (current goals can now be found in the care plan section) Progress towards PT goals: Not progressing toward goals - comment (lethargy)    Frequency    Min 4X/week      PT Plan Current plan remains appropriate    Co-evaluation              AM-PAC PT "6 Clicks" Mobility   Outcome Measure  Help needed turning from your back to your side while in a flat bed without using bedrails?: Total Help needed moving from lying on your back to sitting on the side of a flat bed without using bedrails?: Total Help needed moving to and from a bed to a chair (including a wheelchair)?: Total Help needed standing up from a chair using your arms (e.g., wheelchair or bedside chair)?: Total Help needed to walk in hospital room?: Total Help needed climbing 3-5 steps with a railing? : Total 6 Click Score: 6    End of Session Equipment Utilized During Treatment: Gait belt Activity Tolerance: Patient limited by lethargy Patient left: in bed;with call bell/phone within reach;with bed alarm set;with family/visitor present Nurse Communication: Mobility  status PT Visit Diagnosis: Hemiplegia and hemiparesis;Difficulty in walking, not elsewhere classified (R26.2);Unsteadiness on feet (R26.81) Hemiplegia - Right/Left: Left Hemiplegia - dominant/non-dominant: Non-dominant Hemiplegia - caused by: Cerebral infarction     Time: 0998-3382 PT Time Calculation (min) (ACUTE ONLY): 27 min  Charges:  $Therapeutic Activity: 8-22 mins $Neuromuscular Re-education: 8-22 mins                     Marisa Severin, PT, DPT Acute Rehabilitation Services Secure chat preferred Office [place] 02/02/2022, 10:05 AM

## 2022-02-02 NOTE — Progress Notes (Signed)
Referring Physician(s): Code stroke  Supervising Physician: Pedro Earls  Patient Status:  [facility] - In-pt  Chief Complaint: Follow up CVA/[facility]  Subjective:  Patient seen at bedside, sister and son present. Patient sleeping but arouses to verbal and tactile cues, she states several times "I'm dry" and "should I be drinking water?"   Allergies: Colesevelam, Levofloxacin, Amoxicillin, Benazepril, Clavulanic acid, Methylisothiazolinone, Thimerosal (thiomersal), Amoxicillin-pot clavulanate, Latex, and Simvastatin  Medications: Prior to Admission medications   Medication Sig Start Date End Date Taking? Authorizing Provider  acetaminophen (TYLENOL) 650 MG CR tablet Take 650-1,300 mg by mouth every 8 (eight) hours as needed for pain.    Yes [provider]  azelastine (ASTELIN) 0.1 % nasal spray Place 1 spray into both nostrils daily as needed for allergies. Use in each nostril as directed   Yes [provider]  chlorzoxazone (PARAFON) 500 MG tablet TAKE ONE TABLET FOUR TIMES DAILY AS NEEDED FOR MUSCLE SPASM Patient taking differently: Take 500 mg by mouth 4 (four) times daily as needed for muscle spasms. 10/09/20  Yes [name], Avanti, MD  Cholecalciferol (VITAMIN D-3 PO) Take 1 capsule by mouth daily.   Yes [provider]  EPINEPHrine 0.3 mg/0.3 mL IJ SOAJ injection Inject 0.3 mg into the muscle as needed for anaphylaxis. 01/19/19  Yes [provider]  estradiol (ESTRACE) 0.1 MG/GM vaginal cream INSERT 0.5GM VAGINALLY 2-3 TIMES WEEKLY AS NEEDED Patient taking differently: Place 0.5 g vaginally See admin instructions. Insert 0.5 g vaginally 2-3 times weekly as needed for vulvovaginal atrophy 6/54/65  Yes Copland, Alicia B, PA-C  Fexofenadine HCl (ALLEGRA PO) Take 1 tablet by mouth daily as needed (allergies).   Yes [provider]  levothyroxine (SYNTHROID) 75 MCG tablet TAKE 1 TABLET BY MOUTH EVERY MORNING BEFORE BREAKFAST Patient taking  differently: Take 75 mcg by mouth daily before breakfast. TAKE 1 TABLET BY MOUTH EVERY MORNING BEFORE BREAKFAST 03/21/21  Yes [name], Avanti, MD  losartan (COZAAR) 100 MG tablet TAKE 1 TABLET BY MOUTH ONCE DAILY Patient taking differently: Take 100 mg by mouth daily. 06/19/21  Yes [name], Avanti, MD  LUTEIN PO Take 1 tablet by mouth daily.   Yes [provider]  magnesium oxide (MAG-OX) 400 MG tablet Take 1 tablet by mouth 2 (two) times daily. 01/21/22  Yes [provider]  Prasterone, DHEA, (DHEA PO) Take 1 tablet by mouth daily.   Yes [provider]  terbinafine (LAMISIL) 250 MG tablet Take 250 mg by mouth daily. 12/31/21  Yes [provider]  Turmeric (QC TUMERIC COMPLEX PO) Take 1 tablet by mouth daily.   Yes [provider]  AMBULATORY NON FORMULARY MEDICATION Progesterone 4% cream Apply 1/2 ml 6 nights on, 1 night off NOTE DOSE CHANGE TO TRY TO WEAN OFF Patient not taking: Reported on 01/30/2022 0/35/46   Copland, Alicia B, PA-C  colestipol (COLESTID) 1 g tablet Take 2 tablets (2 g total) by mouth daily. Patient not taking: Reported on 01/30/2022 10/30/20 03/20/22  Virgel Manifold, MD     Vital Signs: BP (!) 176/69   Pulse 70   Temp 98.6 F (37 C) (Axillary)   Resp 19   Ht '5\' 4"'$  (1.626 m)   Wt 167 lb 15.9 oz (76.2 kg)   LMP  (LMP Unknown)   SpO2 94%   BMI 28.84 kg/m   Physical Exam Vitals and nursing note reviewed.  Constitutional:      Comments: Somnolent, arouses to tactile + verbal cues.  HENT:     Head: Normocephalic.  Cardiovascular:     Rate and Rhythm: Normal rate.  Pulmonary:     Effort: Pulmonary effort is normal.  Abdominal:     Comments: (+) NG  Skin:    General: Skin is warm and dry.   Speech is dysarthric, follows simple commands PERRL bilaterally without conjunctival injection or swelling Right gaze preference, unable to see examiner when standing on left Right facial droop   Imaging: CT HEAD WO CONTRAST  (5MM)  Result Date: 02/02/2022 CLINICAL DATA:  76 year old female code stroke presentation on 01/29/2022 with left side weakness, right MCA infarct, right M1 occlusion. Status post endovascular thrombectomy and intracranial stenting. Severe intracranial atherosclerotic disease. Subsequent encounter. EXAM: CT HEAD WITHOUT CONTRAST TECHNIQUE: Contiguous axial images were obtained from the base of the skull through the vertex without intravenous contrast. RADIATION DOSE REDUCTION: This exam was performed according to the departmental dose-optimization program which includes automated exposure control, adjustment of the mA and/or kV according to patient size and/or use of iterative reconstruction technique. COMPARISON:  Post NIR head CT 01/30/2022 and earlier. FINDINGS: Brain: Confluent hypodensity compatible with cytotoxic edema now in the right inferior frontal gyrus and right basal ganglia. Patchy cytotoxic edema in the right posterior operculum and parietal lobe corresponding to DWI abnormality on 01/29/2022. There is some additional cytotoxic edema in the superior right perirolandic region when compared to prior DWI (series 3, image 24). Right hemisphere parenchymal and subarachnoid contrast staining versus blood has largely resolved. Trace residual in the right sylvian fissure. Also, there is a trace amount of layering intraventricular hemorrhage in the right occipital horn now. No midline shift or significant intracranial mass effect. No ventriculomegaly. Basilar cisterns appear patent and normal. No left hemisphere or posterior fossa cytotoxic edema or loss of gray-white matter differentiation identified.6 Vascular: Calcified atherosclerosis at the skull base. Proximal right ICA siphon stent including the petrous segment. Right MCA M1 stent. No suspicious intracranial vascular hyperdensity. Skull: No acute osseous abnormality identified. Sinuses/Orbits: Stable paranasal sinus mucosal thickening and some  layering fluid. Tympanic cavities and mastoids remain clear. Other: New right side nasoenteric tube in place. Mild rightward gaze deviation. No acute scalp soft tissue finding. IMPRESSION: 1. Multifocal right hemisphere cytotoxic edema, including new involvement of the right inferior frontal gyrus, right basal ganglia, and the superior perirolandic cortex when compared to 01/29/2022 DWI. 2. But largely resolved intracranial contrast staining versus hemorrhage since 01/30/2022. Trace IVH and SAH now. 3. No intracranial mass effect or midline shift. No ventriculomegaly. 4. Right ICA siphon and MCA vascular stents. Electronically Signed   By: Genevie Ann M.D.   On: 02/02/2022 04:13   DG Abd 1 View  Result Date: 02/01/2022 CLINICAL DATA:  NG tube placement EXAM: ABDOMEN - 1 VIEW COMPARISON:  Abdominal x-ray 01/30/2022 FINDINGS: Tip of the nasogastric tube is in the proximal stomach just beyond the gastroesophageal junction similar to the prior study. Side hole is at the level of the distal esophagus, also unchanged. No dilated bowel loops are seen. Cholecystectomy clips are present. Lower lumbar fusion hardware is present. There are vascular calcifications in the left upper quadrant, unchanged. IMPRESSION: Nasogastric tube tip is in the proximal stomach just beyond the gastroesophageal junction, similar to prior. Recommend advancing tube proximally 12 cm. Electronically Signed   By: Ronney Asters M.D.   On: 02/01/2022 21:54   [place] EKG SITE RITE  Result Date: 01/30/2022 If Site Rite image not attached, placement could not be confirmed due  to current cardiac rhythm.  DG Abd Portable 1V  Result Date: 01/30/2022 CLINICAL DATA:  76 year old female status post nasogastric tube placement. EXAM: PORTABLE ABDOMEN - 1 VIEW COMPARISON:  No priors. FINDINGS: Tip of nasogastric tube is in the proximal stomach with side port just proximal to the gastroesophageal junction. Paucity of bowel gas in the visualized abdomen (abdomen  is incompletely imaged). IMPRESSION: 1. Nasogastric tube is in a high position and could be advanced 10-15 cm for more optimal placement. Electronically Signed   By: Vinnie Langton M.D.   On: 01/30/2022 05:35   CT HEAD WO CONTRAST (5MM)  Result Date: 01/30/2022 CLINICAL DATA:  Follow-up examination for stroke. EXAM: CT HEAD WITHOUT CONTRAST TECHNIQUE: Contiguous axial images were obtained from the base of the skull through the vertex without intravenous contrast. RADIATION DOSE REDUCTION: This exam was performed according to the departmental dose-optimization program which includes automated exposure control, adjustment of the mA and/or kV according to patient size and/or use of iterative reconstruction technique. COMPARISON:  Prior intra procedural CT from 01/29/2022. FINDINGS: Brain: Sequelae of interval stenting at the right M1 segment. Hyperdensity seen within the right sylvian fissure. Parenchymal hypodensity seen involving the parasagittal and anterior right frontal lobe. Findings could reflect contrast staining and/or subarachnoid hemorrhage, and are similar to prior. Few scattered superimposed calcific densities, likely atherosclerotic calcifications. Evidence for underlying evolving right MCA territory infarct, most notable at the right basal ganglia. No significant regional mass effect at this time. No midline shift. No other acute intracranial hemorrhage. No other new large vessel territory infarct. No mass lesion or extra-axial fluid collection. No hydrocephalus. Vascular: Vascular stent in place at the right M1 segment. Additional stent in place at the petrous right ICA, extending into the cavernous segment. Some residual contrast material seen within the intracranial vasculature. Vascular calcifications present about the skull base. No new worrisome hyperdense vessel. Skull: Scalp soft tissues and calvarium demonstrate no new finding. Sinuses/Orbits: Globes and orbital soft tissues within normal  limits. Scattered mucosal thickening noted about the ethmoidal air cells and maxillary sinuses. Paranasal sinuses are otherwise clear. Mastoid air cells remain clear. Other: None. IMPRESSION: 1. Sequelae of interval stenting of the right ICA and MCA. Scattered hyperdensity involving the right anterior and medial frontal region as well as the right sylvian fissure, similar to prior intra procedural CT. While this finding almost certainly reflects a degree of contrast staining, superimposed subarachnoid hemorrhage at the right sylvian fissure not excluded. Attention at follow-up recommended. 2. Underlying evolving right MCA territory infarct. No significant regional mass effect at this time. 3. No other new acute intracranial abnormality. These results were communicated to Dr. Rory Percy at 2:45 am on 01/30/2022 by text page via the [facility] messaging system. Electronically Signed   By: Jeannine Boga M.D.   On: 01/30/2022 02:46   ECHOCARDIOGRAM COMPLETE  Result Date: 01/29/2022    ECHOCARDIOGRAM REPORT   Patient Name:   Julia Deleon Date of Exam: 01/29/2022 Medical Rec #:  [id number]   Height:       64.0 in Accession #:    [id number]  Weight:       168.0 lb Date of Birth:  1946-02-18    BSA:          1.817 m Patient Age:    18 years    BP:           146/83 mmHg Patient Gender: F           HR:  50 bpm. Exam Location:  Inpatient Procedure: 2D Echo, Cardiac Doppler and Color Doppler Indications:    Stroke  History:        Patient has no prior history of Echocardiogram examinations.                 COPD; Risk Factors:Dyslipidemia and Hypertension.  Sonographer:    Jefferey Pica Referring Phys: [place]  1. Left ventricular ejection fraction, by estimation, is 60 to 65%. The left ventricle has normal function. The left ventricle has no regional wall motion abnormalities. Left ventricular diastolic parameters were normal.  2. Right ventricular systolic function is normal. The right ventricular  size is normal.  3. The mitral valve is normal in structure. No evidence of mitral valve regurgitation. No evidence of mitral stenosis.  4. The aortic valve is normal in structure. Aortic valve regurgitation is mild. Aortic valve sclerosis/calcification is present, without any evidence of aortic stenosis.  5. The inferior vena cava is normal in size with greater than 50% respiratory variability, suggesting right atrial pressure of 3 mmHg. FINDINGS  Left Ventricle: Left ventricular ejection fraction, by estimation, is 60 to 65%. The left ventricle has normal function. The left ventricle has no regional wall motion abnormalities. The left ventricular internal cavity size was normal in size. There is  no left ventricular hypertrophy. Left ventricular diastolic parameters were normal. Right Ventricle: The right ventricular size is normal. No increase in right ventricular wall thickness. Right ventricular systolic function is normal. Left Atrium: Left atrial size was normal in size. Right Atrium: Right atrial size was normal in size. Pericardium: There is no evidence of pericardial effusion. Mitral Valve: The mitral valve is normal in structure. There is mild thickening of the mitral valve leaflet(s). Mild mitral annular calcification. No evidence of mitral valve regurgitation. No evidence of mitral valve stenosis. Tricuspid Valve: The tricuspid valve is normal in structure. Tricuspid valve regurgitation is trivial. No evidence of tricuspid stenosis. Aortic Valve: The aortic valve is normal in structure. Aortic valve regurgitation is mild. Aortic regurgitation PHT measures 726 msec. Aortic valve sclerosis/calcification is present, without any evidence of aortic stenosis. Aortic valve peak gradient measures 7.7 mmHg. Pulmonic Valve: The pulmonic valve was normal in structure. Pulmonic valve regurgitation is trivial. No evidence of pulmonic stenosis. Aorta: The aortic root is normal in size and structure. Venous: The  inferior vena cava is normal in size with greater than 50% respiratory variability, suggesting right atrial pressure of 3 mmHg. IAS/Shunts: No atrial level shunt detected by color flow Doppler.  LEFT VENTRICLE PLAX 2D LVIDd:         4.40 cm   Diastology LVIDs:         2.20 cm   LV e' medial:    6.38 cm/s LV PW:         1.00 cm   LV E/e' medial:  9.9 LV IVS:        1.15 cm   LV e' lateral:   8.63 cm/s LVOT diam:     2.00 cm   LV E/e' lateral: 7.3 LV SV:         81 LV SV Index:   45 LVOT Area:     3.14 cm  RIGHT VENTRICLE             IVC RV Basal diam:  2.60 cm     IVC diam: 1.80 cm RV S prime:     11.70 cm/s TAPSE (M-mode): 2.3 cm LEFT  ATRIUM             Index        RIGHT ATRIUM           Index LA diam:        3.90 cm 2.15 cm/m   RA Area:     10.70 cm LA Vol (A2C):   40.7 ml 22.40 ml/m  RA Volume:   20.40 ml  11.23 ml/m LA Vol (A4C):   30.2 ml 16.62 ml/m LA Biplane Vol: 35.2 ml 19.38 ml/m  AORTIC VALVE                 PULMONIC VALVE AV Area (Vmax): 2.71 cm     PV Vmax:       0.74 m/s AV Vmax:        139.00 cm/s  PV Peak grad:  2.2 mmHg AV Peak Grad:   7.7 mmHg LVOT Vmax:      120.00 cm/s LVOT Vmean:     65.100 cm/s LVOT VTI:       0.258 m AI PHT:         726 msec  AORTA Ao Root diam: 3.40 cm Ao Asc diam:  3.70 cm MITRAL VALVE MV Area (PHT): 3.77 cm     SHUNTS MV Decel Time: 201 msec     Systemic VTI:  0.26 m MV E velocity: 62.90 cm/s   Systemic Diam: 2.00 cm MV A velocity: 100.00 cm/s MV E/A ratio:  0.63 Aditya Sabharwal Electronically signed by Hebert Soho Signature Date/Time: 01/29/2022/4:34:48 PM    Final    CT HEAD WO CONTRAST (5MM)  Result Date: 01/29/2022 CLINICAL DATA:  Altered mental status EXAM: CT HEAD WITHOUT CONTRAST TECHNIQUE: Contiguous axial images were obtained from the base of the skull through the vertex without intravenous contrast. RADIATION DOSE REDUCTION: This exam was performed according to the departmental dose-optimization program which includes automated exposure control,  adjustment of the mA and/or kV according to patient size and/or use of iterative reconstruction technique. COMPARISON:  CT head 01/29/2022.  Brain MRI 01/30/2019 FINDINGS: Brain: Redemonstrated are scattered sites of intracranial calcifications, which appear unchanged compared to prior exam, and may be related to a prior infectious or inflammatory process, or sites of chronic hemorrhage. No evidence of acute hemorrhage. No CT evidence of an infarct. Subtle hypodensity seen in the right parietal cortex, in the region of previously noted infarct on prior brain MRI. Vascular: No hyperdense vessel or unexpected calcification. Skull: Normal. Negative for fracture or focal lesion. Sinuses/Orbits: Bilateral lens replacements. Other: None. IMPRESSION: No acute intracranial abnormality. Electronically Signed   By: Marin Roberts M.D.   On: 01/29/2022 16:10    Labs:  CBC: Recent Labs    04/13/21 0856 01/29/22 1107 01/29/22 1107 01/29/22 1115 01/29/22 1915 01/30/22 0933 01/31/22 0417  WBC 7.8 6.7  --   --   --  11.9* 9.9  HGB 13.4 12.7   < > 12.6 10.5* 10.2* 10.3*  HCT 40.5 36.8   < > 37.0 31.0* 31.0* 30.7*  PLT 219 196  --   --   --  186 171   < > = values in this interval not displayed.    COAGS: Recent Labs    01/29/22 1107  INR 1.1  APTT 28    BMP: Recent Labs    04/13/21 0856 04/13/21 0856 01/29/22 1115 01/29/22 1915 01/29/22 2338 01/30/22 1000 01/31/22 0417  NA 141   < > 137 140 141 143 142  K 4.6  --  5.4* 3.8 3.9 3.7 3.8  CL 104  --  104  --  116* 119* 120*  CO2 21  --   --   --  18* 17* 17*  GLUCOSE 85  --  90  --  135* 104* 105*  BUN 22  --  38*  --  '18 19 18  '$ CALCIUM 9.3  --   --   --  7.6* 7.5* 7.8*  CREATININE 1.11*  --  1.40*  --  1.02* 1.22* 1.13*  GFRNONAA  --   --   --   --  58* 47* 51*   < > = values in this interval not displayed.    LIVER FUNCTION TESTS: Recent Labs    04/13/21 0856 01/29/22 2338  BILITOT 0.6 0.7  AST 16 15  ALT 16 16  ALKPHOS 60 39   PROT 6.8 5.0*  ALBUMIN 4.5 2.9*    Assessment and Plan:  76 y/o F admitted 01/29/22 as a code stroke found to have right MCA occlusion as well as near occlusive stenosis of (R)ICA at the bifurcation. She was given TNK and underwent emergent cerebral angiogram with thrombectomy of M1/M2 and subsequent stent angioplasty. She was subsequently found to have [facility] requiring 2 telescoping [place] flow diverting stents placed across fistula. Finally she underwent (R)ICA stent angioplasty.  She has had a complicated hospital course including hypotension requiring pressor support and persistent bradycardia requiring dopamine. She is now off pressors and bradycardia has improved, per cardiology note from today suspect bradycardia is due to carotid intervention and should improve with time.   Dr Tennis Must Sindy Messing discussed plan with patient's sister and son - do not feel that CCF is playing in patient's current symptoms and will plan to hold off on any further intervention until she improves from a CVA standpoint. We discussed that this would likely be months from now and we will see her as an outpatient to discuss this. They state understanding and agreement to this plan.  No further NIR needs at this time, further plans per neurology. Please call with questions or concerns.  Electronically Signed: Joaquim Nam, PA-C 02/02/2022, 2:00 PM   I spent a total of 15 Minutes at the the patient's bedside AND on the patient's hospital floor or unit, greater than 50% of which was counseling/coordinating care for code stroke/[facility] follow up.

## 2022-02-02 NOTE — Progress Notes (Signed)
Speech Language Pathology Treatment: Dysphagia  Patient Details Name: Julia Deleon MRN: [id number] DOB: 1946/04/21 Today's Date: 02/02/2022 Time: 8185-6314 SLP Time Calculation (min) (ACUTE ONLY): 10 min  Assessment / Plan / Recommendation Clinical Impression  Pt considerably lethargic throughout encounter. When awake, pt displayed difficulty focusing attention. She continues to present with left-neglect. Oral was care provided, dried secretions present throughout oral cavity. Trials of ice chips, thin liquids and puree consistency were administered. Wet vocal quality and multiple swallows noted with thin liquids and anterior spillage noted with ice chips. Pt displayed poor awareness of the bolus as stated she did not know if she swallowed while the bolus was still in the oral cavity. Pt's performance might have been effected by lethargic state. Pt to continue free water protocol. Due to s/sx aspiration noted MBS recommended when appropriate. Family educated on purpose of MBS and agreed with treatment plan.   HPI HPI: 76 year old female patient who was admitted on 9/22 for acute onset left-sided weakness, left facial droop, dysarthria and neglect. CT angiogram of head and neck showed acute occlusion of proximal right M1 and nonopacification of right M2/M3, severe stenosis at right ICA. Pt now s/p TNK and thrombectomy. Bradycardia and hypotention noted post op.      SLP Plan  MBS      Recommendations for follow up therapy are one component of a multi-disciplinary discharge planning process, led by the attending physician.  Recommendations may be updated based on patient status, additional functional criteria and insurance authorization.    Recommendations  Diet recommendations: Other(comment) (Water/ice chips with frequent oral care) Liquids provided via: Straw;Cup Medication Administration: Via alternative means Supervision: Full supervision/cueing for compensatory strategies Compensations:  Slow rate;Small sips/bites;Minimize environmental distractions Postural Changes and/or Swallow Maneuvers: Seated upright 90 degrees                Oral Care Recommendations: Oral care QID Follow Up Recommendations:  (TBD, continue ST at next level of care) Assistance recommended at discharge: Frequent or constant Supervision/Assistance SLP Visit Diagnosis: Cognitive communication deficit (H70.263) Plan: [place], Student SLP   02/02/2022, 11:45 AM

## 2022-02-02 NOTE — Progress Notes (Signed)
Initial Nutrition Assessment  DOCUMENTATION CODES:   Not applicable  INTERVENTION:  - Initiate Osmolite 1.5 @ 15 mL/hr and advance 10 mL/hr to goal rate of 55 mL/hr.  - Provides: 1980 kcals, 83 gm protein, 268 gm CHO, and 1005 mL free water - Hold water flush for now and keep IVF (per MD)   NUTRITION DIAGNOSIS:   Inadequate oral intake related to inability to eat as evidenced by NPO status.  GOAL:   Patient will meet greater than or equal to 90% of their needs  MONITOR:   TF tolerance  REASON FOR ASSESSMENT:   Consult, New TF Enteral/tube feeding initiation and management  ASSESSMENT:   76 y.o. female admits related to acute onset of left sided weakness, left facial droop, and dysarthia with neglect. PMH includes: HLD, HTN, hypothyroidism, arthritis, CKD, COPD. Pt is currently receiving medical management for acute stroke, right MCA territory.  Meds reviewed: senokot. IVF: NS @ 40 mL/hr. Labs reviewed.   Consult to initiate TF. Cortrak Tube placed 9/26. Pt was sleeping at time of assessment. Pt responded to first question but then fell back asleep and would not respond to any more questions. RD unable to gather any nutrition hx at this time and no family present at time of assessment. RD will initiate Osmolite 1.5 @ 15 mL/hr with 10 mL/hr Q8H advancement. Spoke with MD and plan is to keep IVF for now with no free water at this time. RD will continue to monitor TF tolerance.   NUTRITION - FOCUSED PHYSICAL EXAM:  Flowsheet Row Most Recent Value  Orbital Region No depletion  Upper Arm Region Unable to assess  Thoracic and Lumbar Region Unable to assess  Buccal Region No depletion  Temple Region Mild depletion  Clavicle Bone Region No depletion  Clavicle and Acromion Bone Region No depletion  Scapular Bone Region No depletion  Dorsal Hand No depletion  Patellar Region Unable to assess  Anterior Thigh Region Unable to assess  Posterior Calf Region Unable to assess  Edema  (RD Assessment) None  Hair Reviewed  Eyes Unable to assess  Mouth Unable to assess  Skin Reviewed  Nails Reviewed       Diet Order:   Diet Order             Diet NPO time specified  Diet effective now                   EDUCATION NEEDS:   Not appropriate for education at this time  Skin:  Skin Assessment: Reviewed RN Assessment  Last BM:  Unable to assess  Height:   Ht Readings from Last 1 Encounters:  01/29/22 '5\' 4"'$  (1.626 m)    Weight:   Wt Readings from Last 1 Encounters:  01/29/22 76.2 kg    Ideal Body Weight:  54.5 kg  BMI:  Body mass index is 28.84 kg/m.  Estimated Nutritional Needs:   Kcal:  8889-1694 kcals  Protein:  76-115 gm  Fluid:  1905-2280 mL  Julia Deleon, RD, LDN, CNSC

## 2022-02-02 NOTE — Progress Notes (Signed)
Inpatient Rehab Admissions Coordinator:   Per therapy recommendations, patient was screened for CIR candidacy by Azarah Dacy, MS, CCC-SLP. At this time, Pt. is not yet at a level to tolerate the intensity of CIR.  Pt. may have potential to progress to becoming a potential CIR candidate, so CIR admissions team will follow and monitor for progress and participation with therapies and place consult order if Pt. appears to be an appropriate candidate. Please contact me with any questions.   Virginie Josten, MS, CCC-SLP Rehab Admissions Coordinator  [phone] (celll) [phone] (office)   

## 2022-02-02 NOTE — Evaluation (Signed)
Speech Language Pathology Evaluation Patient Details Name: Julia Deleon MRN: [id number] DOB: [birth year] Today's Date: 02/02/2022 Time: 4098-1191 SLP Time Calculation (min) (ACUTE ONLY): 10 min  Problem List:  Patient Active Problem List   Diagnosis Date Noted   Hypotension 01/30/2022   Bradycardia 01/30/2022   Acute ischemic stroke ([place]) 01/29/2022   Essential hypertension 01/23/2021   Cystocele with prolapse 07/30/2020   Postsurgical arthrodesis status 07/25/2020   Facet arthropathy 04/08/2020   Family history of breast cancer 11/16/2019   Body mass index (BMI) 25.0-25.9, adult 07/24/2019   Chronic UTI 12/06/2018   Status post lumbar surgery 12/06/2018   Spondylolisthesis, lumbar region 09/26/2018   Aortic atherosclerosis ([place]) 08/31/2018   Food sensitivity headache 03/29/2018   CAD (coronary artery disease) 02/23/2018   Advanced care planning/counseling discussion 03/28/2017   Varicose veins of both lower extremities with complications 47/82/9562   Arthritis 12/09/2016   Vaginal atrophy 09/10/2015   Vasomotor symptoms due to menopause 09/10/2015   Onychomycosis due to dermatophyte 04/07/2015   Bursitis of right shoulder 02/26/2015   Environmental and seasonal allergies 02/26/2015   Other allergic rhinitis 02/26/2015   Osteoporosis 11/26/2014   COPD (chronic obstructive pulmonary disease) ([place]) 11/26/2014   Fuchs' corneal dystrophy 11/26/2014   Benign hypertensive renal disease 11/26/2014   Hyperlipidemia 11/26/2014   Chronic kidney disease, stage III (moderate) ([place]) 11/26/2014   Hypothyroidism 11/26/2014   Endothelial corneal dystrophy 11/26/2014   Past Medical History:  Past Medical History:  Diagnosis Date   Arthritis    Chronic kidney disease    COPD (chronic obstructive pulmonary disease) ([place])    pt denies    Dyspnea    Family history of adverse reaction to anesthesia    father was slow to wake up   Fuch's endothelial dystrophy    GERD (gastroesophageal  reflux disease)    Hyperlipidemia    Hypertension    Hypothyroidism    Pneumonia    hx of    PONV (postoperative nausea and vomiting)    slow to wake up and PONV   Past Surgical History:  Past Surgical History:  Procedure Laterality Date   ABDOMINAL HYSTERECTOMY     APPENDECTOMY     BACK SURGERY  09/26/2018   L4-5 PLIF by Dr. Arnoldo Morale   CARDIAC CATHETERIZATION     CHOLECYSTECTOMY     COLONOSCOPY WITH PROPOFOL N/A 10/23/2020   Procedure: COLONOSCOPY WITH PROPOFOL;  Surgeon: Virgel Manifold, MD;  Location: [facility] ENDOSCOPY;  Service: Endoscopy;  Laterality: N/A;   ESOPHAGOGASTRODUODENOSCOPY N/A 10/23/2020   Procedure: ESOPHAGOGASTRODUODENOSCOPY (EGD);  Surgeon: Virgel Manifold, MD;  Location: [facility] ENDOSCOPY;  Service: Endoscopy;  Laterality: N/A;   EYE SURGERY     FOOT SURGERY Right    pin removed left   laser vein surgery     NASAL SINUS SURGERY     RADIOLOGY WITH ANESTHESIA N/A 01/29/2022   Procedure: IR WITH ANESTHESIA;  Surgeon: Radiologist, Medication, MD;  Location: [facility];  Service: Radiology;  Laterality: N/A;   RIGHT/LEFT HEART CATH AND CORONARY ANGIOGRAPHY N/A 12/06/2017   Procedure: RIGHT/LEFT HEART CATH AND CORONARY ANGIOGRAPHY;  Surgeon: Yolonda Kida, MD;  Location: [place] CV LAB;  Service: Cardiovascular;  Laterality: N/A;   ROBOTIC ASSISTED LAPAROSCOPIC SACROCOLPOPEXY Bilateral 07/30/2020   Procedure: XI ROBOTIC ASSISTED LAPAROSCOPIC SACROCOLPOPEXY AND SUPRACERVICAL HYSTERECTOMY WITH BILATERAL SALPINGO OOPHERECTOMY;  Surgeon: Ardis Hughs, MD;  Location: [facility] ORS;  Service: Urology;  Laterality: Bilateral;  REQUESTING 4 HRS   TONSILLECTOMY  HPI:  76 year old female patient who was admitted on 9/22 for acute onset left-sided weakness, left facial droop, dysarthria and neglect. CT angiogram of head and neck showed acute occlusion of proximal right M1 and nonopacification of right M2/M3, severe stenosis at right ICA. Pt now s/p TNK and thrombectomy.  Bradycardia and hypotention noted post op.   Assessment / Plan / Recommendation Clinical Impression   Pt seen with son and spouse at bedside. A cognitive linguistic assessment was administered to further assess pt's abilities. Pt was considerably lethargic and difficult to rouse, which hindered her participation in the session. Receptive language abilities appeared mildly impaired with complex language. She adequately followed all simple directions. Her previously noted left neglect hindered accurate responses to some directions. Pt ability to answer abstract yes/no questions was impaired, answer she answered with 50% accuracy. Pt's lethargic state potentially hindered attention to questions which effects her subsequent response. Expressive language abilities, including confrontational naming and divergent naming appeared intact.  Further assessment of expressive abilities not completed due to pt's increasing lethargy. Family educated on the effects of a stroke on cognition. ST will follow for language and cognitive goals.    SLP Assessment  SLP Recommendation/Assessment: Patient needs continued [facility] SLP Visit Diagnosis: Cognitive communication deficit (R41.841)    Recommendations for follow up therapy are one component of a multi-disciplinary discharge planning process, led by the attending physician.  Recommendations may be updated based on patient status, additional functional criteria and insurance authorization.    Follow Up Recommendations  Acute inpatient rehab (3hours/day) (TBD, continue ST at next level of care)    Assistance Recommended at Discharge  Frequent or constant Supervision/Assistance  Functional Status Assessment Patient has had a recent decline in their functional status and demonstrates the ability to make significant improvements in function in a reasonable and predictable amount of time.  Frequency and Duration min 2x/week  2 weeks      SLP  Evaluation Cognition  Overall Cognitive Status: Impaired/Different from baseline Arousal/Alertness: Lethargic       Comprehension  Auditory Comprehension Overall Auditory Comprehension: Impaired Yes/No Questions: Impaired Basic Immediate Environment Questions: 75-100% accurate Complex Questions: Not tested Commands: Within Functional Limits Conversation: Simple Interfering Components: Attention Visual Recognition/Discrimination Discrimination: Not tested Reading Comprehension Reading Status: Not tested    Expression Expression Primary Mode of Expression: Verbal Verbal Expression Overall Verbal Expression: Appears within functional limits for tasks assessed Initiation: No impairment Level of Generative/Spontaneous Verbalization: Word Repetition: No impairment Naming: No impairment Pragmatics: Impairment Impairments: Abnormal affect;Eye contact Written Expression Written Expression: Not tested   Oral / Motor  Oral Motor/Sensory Function Overall Oral Motor/Sensory Function: Other (comment) (to be assessed) Motor Speech Overall Motor Speech: Appears within functional limits for tasks assessed Respiration: Within functional limits Phonation: Normal Resonance: Within functional limits Articulation: Within functional limitis Intelligibility: Intelligible Motor Planning: Not tested Motor Speech Errors: Not applicable            [facility] 02/02/2022, 12:08 PM

## 2022-02-02 NOTE — Progress Notes (Signed)
Rounding Note    Patient Name: Julia Deleon Date of Encounter: 02/02/2022  [facility] Cardiologist: None   Subjective   Somnolent but arousable.  Reports some shortness of breath  Inpatient Medications    Scheduled Meds:  aspirin  81 mg Per Tube Daily   Chlorhexidine Gluconate Cloth  6 each Topical Q0600   heparin injection (subcutaneous)  5,000 Units Subcutaneous Q8H   levothyroxine  75 mcg Per Tube Q0600   mouth rinse  15 mL Mouth Rinse 4 times per day   pantoprazole  40 mg Per Tube Daily   sodium chloride flush  10-40 mL Intracatheter Q12H   ticagrelor  90 mg Per Tube BID   topiramate  25 mg Per Tube BID   Continuous Infusions:  sodium chloride Stopped (01/30/22 1037)   sodium chloride 40 mL/hr at 02/02/22 0600   clevidipine     DOPamine Stopped (01/31/22 0748)   norepinephrine (LEVOPHED) Adult infusion Stopped (01/30/22 1414)   PRN Meds: acetaminophen **OR** acetaminophen (TYLENOL) oral liquid 160 mg/5 mL **OR** acetaminophen, acetaminophen-codeine, hydrALAZINE, mouth rinse, senna-docusate, sodium chloride flush   Vital Signs    Vitals:   02/02/22 0600 02/02/22 0700 02/02/22 0800 02/02/22 0900  BP: (!) 140/54 (!) 150/69 (!) 149/53 127/65  Pulse: 61 (!) 54 60 68  Resp: '18 17 15 20  '$ Temp:   99.6 F (37.6 C)   TempSrc:   Axillary   SpO2: 93% 97% 94% 95%  Weight:      Height:        Intake/Output Summary (Last 24 hours) at 02/02/2022 0942 Last data filed at 02/02/2022 0600 Gross per 24 hour  Intake 1086.26 ml  Output 1000 ml  Net 86.26 ml       01/29/2022   11:00 AM 07/06/2021    2:15 PM 04/28/2021    2:07 PM  Last 3 Weights  Weight (lbs) 167 lb 15.9 oz 164 lb 4 oz 165 lb  Weight (kg) 76.2 kg 74.503 kg 74.844 kg      Telemetry    Sinus bradycardia with rates 40s to 80s- Personally Reviewed  ECG    No new- Personally Reviewed  Physical Exam   GEN: Somnolent but arousable Neck: No JVD Cardiac: Bradycardic, regular, no  murmurs Respiratory: Clear to auscultation bilaterally. GI: Soft, nontender MS: No edema; No deformity. Neuro: Left-sided hemiparesis  Labs    High Sensitivity Troponin:   Recent Labs  Lab 01/30/22 1000 01/30/22 1238  TROPONINIHS 31* 67*      Chemistry Recent Labs  Lab 01/29/22 2338 01/30/22 1000 01/30/22 1105 01/31/22 0417  NA 141 143  --  142  K 3.9 3.7  --  3.8  CL 116* 119*  --  120*  CO2 18* 17*  --  17*  GLUCOSE 135* 104*  --  105*  BUN 18 19  --  18  CREATININE 1.02* 1.22*  --  1.13*  CALCIUM 7.6* 7.5*  --  7.8*  MG  --  2.1 2.1 2.2  PROT 5.0*  --   --   --   ALBUMIN 2.9*  --   --   --   AST 15  --   --   --   ALT 16  --   --   --   ALKPHOS 39  --   --   --   BILITOT 0.7  --   --   --   GFRNONAA 58* 47*  --  51*  ANIONGAP 7 7  --  5     Lipids  Recent Labs  Lab 01/30/22 0639  CHOL 169  TRIG 96  HDL 25*  LDLCALC 125*  CHOLHDL 6.8     Hematology Recent Labs  Lab 01/29/22 1107 01/29/22 1115 01/29/22 1915 01/30/22 0933 01/31/22 0417  WBC 6.7  --   --  11.9* 9.9  RBC 3.89  --   --  3.21* 3.29*  HGB 12.7   < > 10.5* 10.2* 10.3*  HCT 36.8   < > 31.0* 31.0* 30.7*  MCV 94.6  --   --  96.6 93.3  MCH 32.6  --   --  31.8 31.3  MCHC 34.5  --   --  32.9 33.6  RDW 13.0  --   --  13.3 13.7  PLT 196  --   --  186 171   < > = values in this interval not displayed.    Thyroid  Recent Labs  Lab 01/30/22 1000  TSH 1.434     BNPNo results for input(s): "BNP", "PROBNP" in the last 168 hours.  DDimer No results for input(s): "DDIMER" in the last 168 hours.   Radiology    CT HEAD WO CONTRAST (5MM)  Result Date: 02/02/2022 CLINICAL DATA:  76 year old female code stroke presentation on 01/29/2022 with left side weakness, right MCA infarct, right M1 occlusion. Status post endovascular thrombectomy and intracranial stenting. Severe intracranial atherosclerotic disease. Subsequent encounter. EXAM: CT HEAD WITHOUT CONTRAST TECHNIQUE: Contiguous axial  images were obtained from the base of the skull through the vertex without intravenous contrast. RADIATION DOSE REDUCTION: This exam was performed according to the departmental dose-optimization program which includes automated exposure control, adjustment of the mA and/or kV according to patient size and/or use of iterative reconstruction technique. COMPARISON:  Post NIR head CT 01/30/2022 and earlier. FINDINGS: Brain: Confluent hypodensity compatible with cytotoxic edema now in the right inferior frontal gyrus and right basal ganglia. Patchy cytotoxic edema in the right posterior operculum and parietal lobe corresponding to DWI abnormality on 01/29/2022. There is some additional cytotoxic edema in the superior right perirolandic region when compared to prior DWI (series 3, image 24). Right hemisphere parenchymal and subarachnoid contrast staining versus blood has largely resolved. Trace residual in the right sylvian fissure. Also, there is a trace amount of layering intraventricular hemorrhage in the right occipital horn now. No midline shift or significant intracranial mass effect. No ventriculomegaly. Basilar cisterns appear patent and normal. No left hemisphere or posterior fossa cytotoxic edema or loss of gray-white matter differentiation identified.6 Vascular: Calcified atherosclerosis at the skull base. Proximal right ICA siphon stent including the petrous segment. Right MCA M1 stent. No suspicious intracranial vascular hyperdensity. Skull: No acute osseous abnormality identified. Sinuses/Orbits: Stable paranasal sinus mucosal thickening and some layering fluid. Tympanic cavities and mastoids remain clear. Other: New right side nasoenteric tube in place. Mild rightward gaze deviation. No acute scalp soft tissue finding. IMPRESSION: 1. Multifocal right hemisphere cytotoxic edema, including new involvement of the right inferior frontal gyrus, right basal ganglia, and the superior perirolandic cortex when  compared to 01/29/2022 DWI. 2. But largely resolved intracranial contrast staining versus hemorrhage since 01/30/2022. Trace IVH and SAH now. 3. No intracranial mass effect or midline shift. No ventriculomegaly. 4. Right ICA siphon and MCA vascular stents. Electronically Signed   By: Genevie Ann M.D.   On: 02/02/2022 04:13   DG Abd 1 View  Result Date: 02/01/2022 CLINICAL DATA:  NG tube placement EXAM:  ABDOMEN - 1 VIEW COMPARISON:  Abdominal x-ray 01/30/2022 FINDINGS: Tip of the nasogastric tube is in the proximal stomach just beyond the gastroesophageal junction similar to the prior study. Side hole is at the level of the distal esophagus, also unchanged. No dilated bowel loops are seen. Cholecystectomy clips are present. Lower lumbar fusion hardware is present. There are vascular calcifications in the left upper quadrant, unchanged. IMPRESSION: Nasogastric tube tip is in the proximal stomach just beyond the gastroesophageal junction, similar to prior. Recommend advancing tube proximally 12 cm. Electronically Signed   By: Ronney Asters M.D.   On: 02/01/2022 21:54    Cardiac Studies     Patient Profile     76 y.o. female with a hx of COPD, CKD, hypertension, hypothyroidism who presented with acute CVA and is being seen for the evaluation of bradycardia   Assessment & Plan    Bradycardia: Intermittent sinus bradycardia down to 30s, associated with hypotension on 9/23.  Started on dopamine with improvement.  Now improved off dopamine.  Remains in sinus rhythm with rates 40-80s off dopamine. Suspect bradycardia is due to carotid intervention and should improve with time.  Echocardiogram shows normal biventricular function, no significant valvular disease.  Would continue to monitor on telemetry, but do not suspect pacemaker will be needed at this time   Acute CVA: Status post TNK and thrombectomy on 9/22.  Left-sided hemiparesis   For questions or updates, please contact [place] Please  consult [email or web address] for contact info under        Signed, Donato Heinz, MD  02/02/2022, 9:42 AM

## 2022-02-02 NOTE — Procedures (Signed)
Cortrak  Person Inserting Tube:  Maylon Peppers C, RD Tube Type:  Cortrak - 43 inches Tube Size:  10 Tube Location:  Left nare Secured by: Bridle Technique Used to Measure Tube Placement:  Marking at nare/corner of mouth Cortrak Secured At:  63 cm   Cortrak Tube Team Note:  Consult received to place a Cortrak feeding tube.   X-ray is required, abdominal x-ray has been ordered by the Cortrak team. Please confirm tube placement before using the Cortrak tube.   If the tube becomes dislodged please keep the tube and contact the Cortrak team at [email or web address] (password [email or web address]) for replacement.  If after hours and replacement cannot be delayed, place a NG tube and confirm placement with an abdominal x-ray.    Lockie Pares., RD, LDN, CNSC See AMiON for contact information

## 2022-02-02 NOTE — Progress Notes (Signed)
STROKE TEAM PROGRESS NOTE   INTERVAL HISTORY Her husband and son are at the bedside.   She continues to do hemodynamically well off all pressors   She remains neurologically unchanged and arouses to voice and is oriented with some moderate dysarthria.  CT scan of the head done this morning shows multifocal cytotoxic edema in the right hemisphere involving the inferior frontal gyrus, basal ganglia and superior perirolandic cortex.  Largely resolved intracranial contrast staining versus hemorrhage compared to previous CT from 01/30/2022.  Trace intraventricular hemorrhage and subarachnoid hemorrhage.   Vitals:   02/02/22 1300 02/02/22 1400 02/02/22 1500 02/02/22 1600  BP: (!) 176/69 (!) 145/68 (!) 149/49 (!) 145/51  Pulse: 70 69 (!) 58 66  Resp: '19 19 16 17  '$ Temp:    98.6 F (37 C)  TempSrc:    Axillary  SpO2: 94% 96% 96% 97%  Weight:      Height:       CBC:  Recent Labs  Lab 01/29/22 1107 01/29/22 1115 01/30/22 0933 01/31/22 0417  WBC 6.7  --  11.9* 9.9  NEUTROABS 3.9  --   --   --   HGB 12.7   < > 10.2* 10.3*  HCT 36.8   < > 31.0* 30.7*  MCV 94.6  --  96.6 93.3  PLT 196  --  186 171   < > = values in this interval not displayed.   Basic Metabolic Panel:  Recent Labs  Lab 01/30/22 1000 01/30/22 1105 01/31/22 0417  NA 143  --  142  K 3.7  --  3.8  CL 119*  --  120*  CO2 17*  --  17*  GLUCOSE 104*  --  105*  BUN 19  --  18  CREATININE 1.22*  --  1.13*  CALCIUM 7.5*  --  7.8*  MG 2.1 2.1 2.2  PHOS  --   --  2.1*   Lipid Panel:  Recent Labs  Lab 01/30/22 0639  CHOL 169  TRIG 96  HDL 25*  CHOLHDL 6.8  VLDL 19  LDLCALC 125*   HgbA1c:  Recent Labs  Lab 01/29/22 1107  HGBA1C 5.4   Urine Drug Screen:  Recent Labs  Lab 01/29/22 2215  LABOPIA NONE DETECTED  COCAINSCRNUR NONE DETECTED  LABBENZ NONE DETECTED  AMPHETMU NONE DETECTED  THCU NONE DETECTED  LABBARB NONE DETECTED    Alcohol Level  Recent Labs  Lab 01/29/22 1107  [place] <10    IMAGING past  24 hours DG Abd Portable 1V  Result Date: 02/02/2022 CLINICAL DATA:  Feeding tube placement EXAM: PORTABLE ABDOMEN - 1 VIEW COMPARISON:  CT 10/29/2020 FINDINGS: Feeding tube tip overlies the distal stomach. Nonobstructive bowel gas pattern. Vascular calcifications. There is a partially visualized right upper extremity PICC tip overlying the right atrium. Lower lung airspace opacities and probable trace effusions. L4-L5 lumbar fusion. IMPRESSION: Feeding tube tip overlies the distal stomach. Nonobstructive bowel gas pattern. Lower lung airspace opacities and probable small effusions. Recommend chest radiograph. Electronically Signed   By: Maurine Simmering M.D.   On: 02/02/2022 14:49   CT HEAD WO CONTRAST (5MM)  Result Date: 02/02/2022 CLINICAL DATA:  76 year old female code stroke presentation on 01/29/2022 with left side weakness, right MCA infarct, right M1 occlusion. Status post endovascular thrombectomy and intracranial stenting. Severe intracranial atherosclerotic disease. Subsequent encounter. EXAM: CT HEAD WITHOUT CONTRAST TECHNIQUE: Contiguous axial images were obtained from the base of the skull through the vertex without intravenous contrast. RADIATION DOSE REDUCTION:  This exam was performed according to the departmental dose-optimization program which includes automated exposure control, adjustment of the mA and/or kV according to patient size and/or use of iterative reconstruction technique. COMPARISON:  Post NIR head CT 01/30/2022 and earlier. FINDINGS: Brain: Confluent hypodensity compatible with cytotoxic edema now in the right inferior frontal gyrus and right basal ganglia. Patchy cytotoxic edema in the right posterior operculum and parietal lobe corresponding to DWI abnormality on 01/29/2022. There is some additional cytotoxic edema in the superior right perirolandic region when compared to prior DWI (series 3, image 24). Right hemisphere parenchymal and subarachnoid contrast staining versus blood  has largely resolved. Trace residual in the right sylvian fissure. Also, there is a trace amount of layering intraventricular hemorrhage in the right occipital horn now. No midline shift or significant intracranial mass effect. No ventriculomegaly. Basilar cisterns appear patent and normal. No left hemisphere or posterior fossa cytotoxic edema or loss of gray-white matter differentiation identified.6 Vascular: Calcified atherosclerosis at the skull base. Proximal right ICA siphon stent including the petrous segment. Right MCA M1 stent. No suspicious intracranial vascular hyperdensity. Skull: No acute osseous abnormality identified. Sinuses/Orbits: Stable paranasal sinus mucosal thickening and some layering fluid. Tympanic cavities and mastoids remain clear. Other: New right side nasoenteric tube in place. Mild rightward gaze deviation. No acute scalp soft tissue finding. IMPRESSION: 1. Multifocal right hemisphere cytotoxic edema, including new involvement of the right inferior frontal gyrus, right basal ganglia, and the superior perirolandic cortex when compared to 01/29/2022 DWI. 2. But largely resolved intracranial contrast staining versus hemorrhage since 01/30/2022. Trace IVH and SAH now. 3. No intracranial mass effect or midline shift. No ventriculomegaly. 4. Right ICA siphon and MCA vascular stents. Electronically Signed   By: Genevie Ann M.D.   On: 02/02/2022 04:13   DG Abd 1 View  Result Date: 02/01/2022 CLINICAL DATA:  NG tube placement EXAM: ABDOMEN - 1 VIEW COMPARISON:  Abdominal x-ray 01/30/2022 FINDINGS: Tip of the nasogastric tube is in the proximal stomach just beyond the gastroesophageal junction similar to the prior study. Side hole is at the level of the distal esophagus, also unchanged. No dilated bowel loops are seen. Cholecystectomy clips are present. Lower lumbar fusion hardware is present. There are vascular calcifications in the left upper quadrant, unchanged. IMPRESSION: Nasogastric tube tip  is in the proximal stomach just beyond the gastroesophageal junction, similar to prior. Recommend advancing tube proximally 12 cm. Electronically Signed   By: Ronney Asters M.D.   On: 02/01/2022 21:54    Physical Exam  Constitutional: Appears well-developed and well-nourished elderly Caucasian lady.   Cardiovascular: Normal rate and regular rhythm.  Respiratory: Effort normal, non-labored breathing  Neuro: Mental Status: Patient is drowsy, but, oriented to person, place, month, year, and situation Right gaze preference, comes to midline. Follows commands speech is hesitant with some dysarthria but at times gibberish difficult to understand Cranial Nerves: II: Right gaze preference, unable to look to the left III,IV, VI: EOMI without ptosis or diploplia..  Blinks to threat on the right and not on the left. V: Facial sensation is symmetric to temperature VII: Right facial droop  VIII: Hearing is intact to voice X: voice is hypophonic  XI: Shoulder shrug is symmetric. XII: Tongue protrudes midline without atrophy or fasciculations.  Motor: Tone is normal. Bulk is normal.  Right full strength LLE 2/5 LUE 0/5 Sensory: No sensory deficit noted in any extremity  Cerebellar: FNF and HKS are intact bilaterally   ASSESSMENT/PLAN Julia Deleon  is a 76 y.o. female with history of arthritis, CKD, COPD, Fuch's corneal endothelial dystrophy, HLD, HTN and hypothyroidism who presents to the ED via EMS as a Code Stroke with acute onset of left sided weakness, left facial droop and dysarthria with neglect. Initially symptoms improved in IR suite after TNK and she was taken back to her ED room. She then had a change in neurological exam and was reactivated as a Code IR.  Stroke: Right MCA infarct s/p TNK and mechanical thrombectomy with TICI2c revascularization  Etiology:  atherosclerosis and stenosis of intracranial vessels   Code Stroke CT head Loss of gray-white differentiation in the posterior  right insular cortex and right lentiform nucleus compatible with acute/subacute right MCA infarct. Aspects 8/10 CTA head & neck Acute occlusion of the proximal right M1. Severe stenosis at the origin of the right ICA (>75%). Repeat CT Head Sequelae of interval stenting of the right ICA and MCA. Scattered hyper density involving the right anterior and medial frontal region as well as the right sylvian fissure, similar to prior intra procedural CT. While this finding almost certainly reflects a degree of contrast staining, superimposed subarachnoid hemorrhage at the right sylvian fissure not excluded. Underlying evolving right MCA territory infarct. Post IR CT contrast staining in the basal ganglia and right medial frontal cortex as well as trace hyperdensity in the right sylvian fissure. MRI Pre procedure- Acute/subacute nonhemorrhagic infarct involving the posterior right parietal cortex  MRA  Pre procedure 1. Moderate stenosis of the right cavernous ICA. 2. Mild narrowing of the left cavernous ICA. 3. High-grade right M1 segment stenosis with asymmetric attenuation of distal MCA branch vessels. 4. Mild narrowing of the proximal right A1 segment. 5. Mild right P2 segment stenosis. 6. The left vertebral artery is the dominant vessel. 2D Echo EF 55-60% LDL 125 HgbA1c 5.4 VTE prophylaxis - SCDs    Diet   Diet NPO time specified   No antithrombotic prior to admission, now on aspirin 81 mg daily and Brilinta (ticagrelor) 90 mg bid.  Therapy recommendations:  Pending Disposition:  Pending  Mechanical Thrombectomy with cervical carotid angioplasty and stent placement Mechanical thrombectomy performed with direct contact aspiration with recanalization of the M1 segment with residual atherosclerotic stenosis. Mechanical thrombectomy performed with stent retriever with recanalization of M2  (TICI 2C).  [facility] post procedure- IR following for stabilization so this can be coiled once she is  stabilized  Hx of hypertension now hypotensive with bradycardia requiring vasopressors Home meds:  Losartan Unstable BP goal less than 160 Dopamine off Norepinephrine off  Hyperlipidemia Home meds:  None, LDL 125, goal < 70 add when able to take PO- reports allergy to simvastatin  Dysphagia NG tube in place  SLP following   Other Stroke Risk Factors Advanced Age >/= 26   Other Active Problems CKD Stage 3 Cr and GFR stable  Intermittent bradycardia with hypotension Now on dopamine Cardiology consulted CCM consulted Hold antihypertensive medications PICC line placed for vasopressors  Headache Added tylenol with codeine PRN Topamax '25mg'$  BID COPD Not in acute exacerbation   Hospital day # 4  Patient remains neurologically stable as well as hemodynamically.  Follow-up brain imaging shows mild cytotoxic edema with significant resolution of her contrast staining and no worrisome findings in the orbits to worry about carotid-cavernous fistula.  Hence we will hold off on further invasive neurovascular imaging at least till she improves much more neurologically.  Long discussion with the patient's husband and son at the bedside and they  are in agreement with plan.  Also discussed with Dr.  Norma Fredrickson interventional neuroradiologist who also is in agreement.  Continue mobilization out of bed and therapy consults.     Will consider transfer out of the ICU soon if bed needed. This patient is critically ill and at significant risk of neurological worsening, death and care requires constant monitoring of vital signs, hemodynamics,respiratory and cardiac monitoring, extensive review of multiple databases, frequent neurological assessment, discussion with family, other specialists and medical decision making of high complexity.I have made any additions or clarifications directly to the above note.This critical care time does not reflect procedure time, or teaching time or supervisory time of  PA/NP/Med Resident etc but could involve care discussion time.  I spent 32 minutes of neurocritical care time  in the care of  this patient.      Antony Contras, MD Medical Director [facility] Pager: [phone] 02/02/2022 5:07 PM    To contact Stroke Continuity provider, please refer to [email or web address] After hours, contact General Neurology

## 2022-02-03 ENCOUNTER — Ambulatory Visit: Payer: Medicare Other

## 2022-02-03 ENCOUNTER — Inpatient Hospital Stay (HOSPITAL_COMMUNITY): Payer: Medicare Other

## 2022-02-03 DIAGNOSIS — I639 Cerebral infarction, unspecified: Secondary | ICD-10-CM | POA: Diagnosis not present

## 2022-02-03 LAB — PHOSPHORUS
Phosphorus: 1.5 mg/dL — ABNORMAL LOW (ref 2.5–4.6)
Phosphorus: 3.1 mg/dL (ref 2.5–4.6)

## 2022-02-03 LAB — GLUCOSE, CAPILLARY
Glucose-Capillary: 166 mg/dL — ABNORMAL HIGH (ref 70–99)
Glucose-Capillary: 158 mg/dL — ABNORMAL HIGH (ref 70–99)
Glucose-Capillary: 157 mg/dL — ABNORMAL HIGH (ref 70–99)
Glucose-Capillary: 128 mg/dL — ABNORMAL HIGH (ref 70–99)
Glucose-Capillary: 157 mg/dL — ABNORMAL HIGH (ref 70–99)
Glucose-Capillary: 151 mg/dL — ABNORMAL HIGH (ref 70–99)

## 2022-02-03 LAB — MAGNESIUM
Magnesium: 2.2 mg/dL (ref 1.7–2.4)
Magnesium: 2.2 mg/dL (ref 1.7–2.4)

## 2022-02-03 MED ORDER — LOSARTAN POTASSIUM 50 MG PO TABS: 25.0000 mg | ORAL_TABLET | Freq: Every day | ORAL | Status: DC

## 2022-02-03 MED ORDER — LOSARTAN POTASSIUM 25 MG PO TABS
25.0000 mg | ORAL_TABLET | Freq: Every day | ORAL | Status: DC
  Administered 2022-02-04 – 2022-02-11 (×8): 25 mg
  Filled 2022-02-03 (×8): qty 1

## 2022-02-03 MED ORDER — SODIUM PHOSPHATES 45 MMOLE/15ML IV SOLN
45.0000 mmol | Freq: Once | INTRAVENOUS | Status: AC
  Administered 2022-02-03: 45 mmol via INTRAVENOUS
  Filled 2022-02-03: qty 15

## 2022-02-03 MED ORDER — ALBUTEROL SULFATE (2.5 MG/3ML) 0.083% IN NEBU: 2.5000 mg | INHALATION_SOLUTION | Freq: Four times a day (QID) | RESPIRATORY_TRACT | Status: DC | PRN

## 2022-02-03 MED ORDER — FUROSEMIDE 10 MG/ML IJ SOLN
20.0000 mg | Freq: Once | INTRAMUSCULAR | Status: AC
  Administered 2022-02-03: 20 mg via INTRAVENOUS
  Filled 2022-02-03: qty 2

## 2022-02-03 MED ORDER — ROSUVASTATIN CALCIUM 20 MG PO TABS
20.0000 mg | ORAL_TABLET | Freq: Every day | ORAL | Status: DC
  Administered 2022-02-03 – 2022-02-11 (×9): 20 mg
  Filled 2022-02-03 (×9): qty 1

## 2022-02-03 NOTE — Progress Notes (Signed)
STROKE TEAM PROGRESS NOTE   INTERVAL HISTORY No family member at the bedside.   She continues to do hemodynamically well off all pressors   She remains neurologically unchanged and arouses to voice and is oriented with some moderate dysarthria.  She is scheduled for modified barium swallow today.  Vital signs stable.  Neurological exam unchanged Vitals:   02/03/22 1100 02/03/22 1200 02/03/22 1300 02/03/22 1400  BP: (!) 151/48 (!) 130/51 (!) 145/63 (!) 146/54  Pulse: 61 (!) 56 (!) 53 (!) 57  Resp: '16 16 13 13  '$ Temp:  98.8 F (37.1 C)    TempSrc:  Axillary    SpO2: 94% 94% 98% 97%  Weight:      Height:       CBC:  Recent Labs  Lab 01/29/22 1107 01/29/22 1115 01/30/22 0933 01/31/22 0417  WBC 6.7  --  11.9* 9.9  NEUTROABS 3.9  --   --   --   HGB 12.7   < > 10.2* 10.3*  HCT 36.8   < > 31.0* 30.7*  MCV 94.6  --  96.6 93.3  PLT 196  --  186 171   < > = values in this interval not displayed.   Basic Metabolic Panel:  Recent Labs  Lab 01/30/22 1000 01/30/22 1105 01/31/22 0417 02/02/22 1814 02/03/22 0639  NA 143  --  142  --   --   K 3.7  --  3.8  --   --   CL 119*  --  120*  --   --   CO2 17*  --  17*  --   --   GLUCOSE 104*  --  105*  --   --   BUN 19  --  18  --   --   CREATININE 1.22*  --  1.13*  --   --   CALCIUM 7.5*  --  7.8*  --   --   MG 2.1   < > 2.2 2.2 2.2  PHOS  --    < > 2.1* 2.1* 1.5*   < > = values in this interval not displayed.   Lipid Panel:  Recent Labs  Lab 01/30/22 0639  CHOL 169  TRIG 96  HDL 25*  CHOLHDL 6.8  VLDL 19  LDLCALC 125*   HgbA1c:  Recent Labs  Lab 01/29/22 1107  HGBA1C 5.4   Urine Drug Screen:  Recent Labs  Lab 01/29/22 2215  LABOPIA NONE DETECTED  COCAINSCRNUR NONE DETECTED  LABBENZ NONE DETECTED  AMPHETMU NONE DETECTED  THCU NONE DETECTED  LABBARB NONE DETECTED    Alcohol Level  Recent Labs  Lab 01/29/22 1107  [place] <10    IMAGING past 24 hours No results found.  Physical Exam  Constitutional: Appears  well-developed and well-nourished elderly Caucasian lady.   Cardiovascular: Normal rate and regular rhythm.  Respiratory: Effort normal, non-labored breathing  Neuro: Mental Status: Patient is drowsy, but, oriented to person, place, month, year, and situation Right gaze preference, comes to midline. Follows commands speech is hesitant with some dysarthria but at times gibberish difficult to understand Cranial Nerves: II: Right gaze preference, unable to look to the left III,IV, VI: EOMI without ptosis or diploplia..  Blinks to threat on the right and not on the left. V: Facial sensation is symmetric to temperature VII: Right facial droop  VIII: Hearing is intact to voice X: voice is hypophonic  XI: Shoulder shrug is symmetric. XII: Tongue protrudes midline without atrophy or fasciculations.  Motor: Tone is normal. Bulk is normal.  Right full strength LLE 2/5 LUE 0/5 Sensory: No sensory deficit noted in any extremity  Cerebellar: FNF and HKS are intact bilaterally   ASSESSMENT/PLAN Julia Deleon is a 76 y.o. female with history of arthritis, CKD, COPD, Fuch's corneal endothelial dystrophy, HLD, HTN and hypothyroidism who presents to the ED via EMS as a Code Stroke with acute onset of left sided weakness, left facial droop and dysarthria with neglect. Initially symptoms improved in IR suite after TNK and she was taken back to her ED room. She then had a change in neurological exam and was reactivated as a Code IR.  Stroke: Right MCA infarct s/p TNK and mechanical thrombectomy with TICI2c revascularization  Etiology:  atherosclerosis and stenosis of intracranial vessels   Code Stroke CT head Loss of gray-white differentiation in the posterior right insular cortex and right lentiform nucleus compatible with acute/subacute right MCA infarct. Aspects 8/10 CTA head & neck Acute occlusion of the proximal right M1. Severe stenosis at the origin of the right ICA (>75%). Repeat CT Head  Sequelae of interval stenting of the right ICA and MCA. Scattered hyper density involving the right anterior and medial frontal region as well as the right sylvian fissure, similar to prior intra procedural CT. While this finding almost certainly reflects a degree of contrast staining, superimposed subarachnoid hemorrhage at the right sylvian fissure not excluded. Underlying evolving right MCA territory infarct. Post IR CT contrast staining in the basal ganglia and right medial frontal cortex as well as trace hyperdensity in the right sylvian fissure. MRI Pre procedure- Acute/subacute nonhemorrhagic infarct involving the posterior right parietal cortex  MRA  Pre procedure 1. Moderate stenosis of the right cavernous ICA. 2. Mild narrowing of the left cavernous ICA. 3. High-grade right M1 segment stenosis with asymmetric attenuation of distal MCA branch vessels. 4. Mild narrowing of the proximal right A1 segment. 5. Mild right P2 segment stenosis. 6. The left vertebral artery is the dominant vessel. 2D Echo EF 55-60% LDL 125 HgbA1c 5.4 VTE prophylaxis - SCDs    Diet   DIET DYS 3 Room service appropriate? Yes; Fluid consistency: Honey Thick   No antithrombotic prior to admission, now on aspirin 81 mg daily and Brilinta (ticagrelor) 90 mg bid.  Therapy recommendations:  Pending Disposition:  Pending  Mechanical Thrombectomy with cervical carotid angioplasty and stent placement Mechanical thrombectomy performed with direct contact aspiration with recanalization of the M1 segment with residual atherosclerotic stenosis. Mechanical thrombectomy performed with stent retriever with recanalization of M2  (TICI 2C).  [facility] post procedure- IR following for stabilization so this can be coiled once she is stabilized  Hx of hypertension now hypotensive with bradycardia requiring vasopressors Home meds:  Losartan Unstable BP goal less than 160 Dopamine off Norepinephrine off  Hyperlipidemia Home meds:   None, LDL 125, goal < 70 add when able to take PO- reports allergy to simvastatin  Dysphagia NG tube in place  SLP following   Other Stroke Risk Factors Advanced Age >/= 23   Other Active Problems CKD Stage 3 Cr and GFR stable  Intermittent bradycardia with hypotension Now on dopamine Cardiology consulted CCM consulted Hold antihypertensive medications PICC line placed for vasopressors  Headache Added tylenol with codeine PRN Topamax '25mg'$  BID COPD Not in acute exacerbation   Hospital day # 5  Patient remains neurologically stable as well as hemodynamically.  Check swallow exam today to see if she can swallow otherwise continue core track  tube feedings..  Continue mobilization out of bed and therapy consults.     Will consider transfer out of the ICU soon if bed needed. This patient is critically ill and at significant risk of neurological worsening, death and care requires constant monitoring of vital signs, hemodynamics,respiratory and cardiac monitoring, extensive review of multiple databases, frequent neurological assessment, discussion with family, other specialists and medical decision making of high complexity.I have made any additions or clarifications directly to the above note.This critical care time does not reflect procedure time, or teaching time or supervisory time of PA/NP/Med Resident etc but could involve care discussion time.  I spent 30 minutes of neurocritical care time  in the care of  this patient.      Antony Contras, MD Medical Director [facility] Pager: [phone] 02/03/2022 3:16 PM    To contact Stroke Continuity provider, please refer to [email or web address] After hours, contact General Neurology

## 2022-02-03 NOTE — Progress Notes (Signed)
Occupational Therapy Treatment Patient Details Name: Julia Deleon MRN: [id number] DOB: [birth year] Today's Date: 02/03/2022   History of present illness Patient is a 76 y/o female who presents on 9/22 with left sided weakness and slurred speech. Found to have Rt M1 occlusion s/p TNK and mechanical thrombectomy 9/22. Developed bradycardia with hypotension in ICU.  CT 9/22 Multifocal right hemisphere cytotoxic edema, including new  involvement of the right inferior frontal gyrus, right basal  ganglia, and the superior perirolandic cortex;  Trace IVH and SAH  PMH includes COPD, back surgery (L4-5 fusion), CKD.   OT comments  Pt seen as cotreat with PT to increase mobility. Pt lethargic throughout session, however appears improved since last session. Able to participate in tasks EOB and at bed level, however distracted by pain and complaints of dizziness. Once pt able to sustain attention to task, performance improves. Able to briefly sustain visual attention to functional task close to midline when brushing her teeth, combing her hair/tasks she wanted to complete. May trial occluding part of R field to facilitate attention toward L. As level of arousal and attention improves, feel she will benefit from extensive rehab at [facility] however will most likely need significant assistance after DC. Acute OT to continue to follow.    Recommendations for follow up therapy are one component of a multi-disciplinary discharge planning process, led by the attending physician.  Recommendations may be updated based on patient status, additional functional criteria and insurance authorization.    Follow Up Recommendations  Acute inpatient rehab (3hours/day)    Assistance Recommended at Discharge Frequent or constant Supervision/Assistance  Patient can return home with the following  Two people to help with walking and/or transfers;Two people to help with bathing/dressing/bathroom;Direct supervision/assist for medications  management;Direct supervision/assist for financial management;Assistance with feeding;Assistance with cooking/housework;Assist for transportation;Help with stairs or ramp for entrance   Equipment Recommendations  BSC/3in1;Wheelchair (measurements OT);Wheelchair cushion (measurements OT)    Recommendations for Other Services Rehab consult    Precautions / Restrictions Precautions Precautions: Fall;Other (comment) Precaution Comments: NGT, L hemiparesis Restrictions Weight Bearing Restrictions: No       Mobility Bed Mobility Overal bed mobility: Needs Assistance Bed Mobility: Rolling, Sidelying to Sit, Sit to Supine Rolling: Min assist Sidelying to sit: Mod assist, +2 for physical assistance   Sit to supine: Max assist, +2 for physical assistance   General bed mobility comments: OT facilitated rolling towards R x 5 with cues for initiation and technique. Assist for R sided management and trunk for supine <> sit.    Transfers Overall transfer level: Needs assistance Equipment used: 2 person hand held assist Transfers: Sit to/from Stand Sit to Stand: Max assist, +2 physical assistance           General transfer comment: MaxA + 2 to rise to partial standing from edge of bed with left knee block.     Balance Overall balance assessment: Needs assistance Sitting-balance support: Feet supported, Single extremity supported Sitting balance-Leahy Scale: Poor Sitting balance - Comments: Requires min-modA for sitting balance with left lateral lean. Tactile/verbal cueing for midline, asking pt to "touch" PT shoulder on right side to facilitate. Asked pt to wash bilateral legs with washcloth to promote anterior lean.   Standing balance support: Bilateral upper extremity supported Standing balance-Leahy Scale: Zero                             ADL either performed or assessed with clinical  judgement   ADL   Eating/Feeding: NPO;Total assistance (coretrack)   Grooming:  Moderate assistance Grooming Details (indicate cue type and reason): able to copmlete oral care with toothette however appears to perseverate; cues to brush L sid eof mouth; assistance to visually locate rinse Upper Body Bathing: Maximal assistance   Lower Body Bathing: Maximal assistance;Bed level                       Functional mobility during ADLs: Maximal assistance;+2 for physical assistance (unableto achieve full upright posture)      Extremity/Trunk Assessment Upper Extremity Assessment Upper Extremity Assessment: RUE deficits/detail;LUE deficits/detail RUE Deficits / Details: generalized weakness LUE Deficits / Details: imparied sensory motor/impaired tone; moving LUE however not functionally/controlled or on command; complaining of pain L shoulder   Lower Extremity Assessment Lower Extremity Assessment: Defer to PT evaluation LLE Deficits / Details: Increased tone present, no active movement appreciated to command.        Vision   Vision Assessment?: Vision impaired- to be further tested in functional context Additional Comments: no blink to threat on L; poor visual attention; R gaze preference   Perception Perception Perception: Impaired (L neglect)   Praxis Praxis Praxis: Impaired    Cognition Arousal/Alertness: Lethargic Behavior During Therapy: Flat affect Overall Cognitive Status: Impaired/Different from baseline Area of Impairment: Attention, Memory, Following commands, Safety/judgement, Problem solving                   Current Attention Level: Focused Memory: Decreased short-term memory Following Commands: Follows one step commands consistently, Follows one step commands with increased time Safety/Judgement: Decreased awareness of deficits, Decreased awareness of safety   Problem Solving: Slow processing, Requires verbal cues General Comments: Pt sleepy during session; able to stimulate with verbal cues and more alert sitting edge of  bed. Pt able to state full name and birthday; oriented to time of day, but not day of week. Oriented to situation; when asked what stroke affected, pt stating, "everything." severe left neglect.        Exercises Exercises: Other exercises Other Exercises Other Exercises: general PROM LUE through full range as tolerated Other Exercises: repetitive rolling to R with improved motor planning and initiation with repetition    Shoulder Instructions       General Comments multiple open areas on skin - reports she had just gone to her dermalologist    Pertinent Vitals/ Pain       Pain Assessment Pain Assessment: Faces Faces Pain Scale: Hurts little more Pain Location: neck, back Pain Descriptors / Indicators: Discomfort, Grimacing, Headache Pain Intervention(s): Limited activity within patient's tolerance  Home Living                                          Prior Functioning/Environment              Frequency  Min 2X/week        Progress Toward Goals  OT Goals(current goals can now be found in the care plan section)  Progress towards OT goals: Progressing toward goals  Acute Rehab OT Goals Patient Stated Goal: per pt to get better OT Goal Formulation: With patient Time For Goal Achievement: 02/15/22 Potential to Achieve Goals: Good ADL Goals Pt Will Perform Upper Body Dressing: with min assist;sitting Pt Will Perform Lower Body Dressing: with mod assist;sit to/from stand Pt Will  Transfer to Toilet: with mod assist;stand pivot transfer;bedside commode Additional ADL Goal #1: Pt will attend to stimuli at midline with minimal cues to participate in ADLs  Plan Discharge plan remains appropriate    Co-evaluation    PT/OT/SLP Co-Evaluation/Treatment: Yes Reason for Co-Treatment: Complexity of the patient's impairments (multi-system involvement);For patient/therapist safety;To address functional/ADL transfers PT goals addressed during session:  Mobility/safety with mobility OT goals addressed during session: ADL's and self-care;Strengthening/ROM      AM-PAC OT "6 Clicks" Daily Activity     Outcome Measure   Help from another person eating meals?: Total Help from another person taking care of personal grooming?: A Lot Help from another person toileting, which includes using toliet, bedpan, or urinal?: Total Help from another person bathing (including washing, rinsing, drying)?: A Lot Help from another person to put on and taking off regular upper body clothing?: A Lot Help from another person to put on and taking off regular lower body clothing?: Total 6 Click Score: 9    End of Session Equipment Utilized During Treatment: Gait belt  OT Visit Diagnosis: Unsteadiness on feet (R26.81);Other abnormalities of gait and mobility (R26.89);Muscle weakness (generalized) (M62.81);Low vision, both eyes (H54.2);Apraxia (R48.2);Other symptoms and signs involving cognitive function;Other symptoms and signs involving the nervous system (R29.898);Pain;Hemiplegia and hemiparesis Hemiplegia - Right/Left: Left Hemiplegia - dominant/non-dominant: Non-Dominant Hemiplegia - caused by: Cerebral infarction Pain - Right/Left: Left Pain - part of body: Shoulder (neck/back)   Activity Tolerance Patient tolerated treatment well   Patient Left in bed;with call bell/phone within reach;with bed alarm set;with family/visitor present   Nurse Communication Mobility status        Time: 8416-6063 OT Time Calculation (min): 41 min  Charges: OT General Charges $OT Visit: 1 Visit OT Treatments $Self Care/Home Management : 8-22 mins $Neuromuscular Re-education: 8-22 mins  Maurie Boettcher, OT/L   Acute OT Clinical Specialist Acute Rehabilitation Services Pager [phone] Office [phone]   [facility] 02/03/2022, 9:14 AM

## 2022-02-03 NOTE — Progress Notes (Signed)
Physical Therapy Treatment Patient Details Name: Julia Deleon MRN: [id number] DOB: 1946-02-01 Today's Date: 02/03/2022   History of Present Illness Patient is a 76 y/o female who presents on 9/22 with left sided weakness and slurred speech. Found to have Rt M1 occlusion s/p TNK and mechanical thrombectomy 9/22. Developed bradycardia with hypotension in ICU.  PMH includes COPD, back surgery, CKD.    PT Comments    Pt progressing towards her physical therapy goals; seems more appropriate today during conversation, although still lacks insight/awareness into deficits and requires stimulation to maintain arousal. Session focused on bed mobility, neuromuscular re-education and transfer training. Emphasis on midline positioning in sitting and promoting left sided awareness. Deferred transfer to chair as pt feeling urge to have BM. Positioned on bedpan, however, pt quickly falling back asleep once in supine position. Based on PLOF, motivation and family support, highly recommend AIR to address deficits and maximize functional mobility.     Recommendations for follow up therapy are one component of a multi-disciplinary discharge planning process, led by the attending physician.  Recommendations may be updated based on patient status, additional functional criteria and insurance authorization.  Follow Up Recommendations  Acute inpatient rehab (3hours/day)     Assistance Recommended at Discharge Frequent or constant Supervision/Assistance  Patient can return home with the following Two people to help with walking and/or transfers;A lot of help with bathing/dressing/bathroom;Assistance with cooking/housework;Direct supervision/assist for medications management;Assist for transportation;Help with stairs or ramp for entrance   Equipment Recommendations  Other (comment) (defer)    Recommendations for Other Services       Precautions / Restrictions Precautions Precautions: Fall;Other  (comment) Precaution Comments: NGT, L hemiparesis Restrictions Weight Bearing Restrictions: No     Mobility  Bed Mobility Overal bed mobility: Needs Assistance Bed Mobility: Rolling, Sidelying to Sit, Sit to Supine Rolling: Min assist Sidelying to sit: Mod assist, +2 for physical assistance   Sit to supine: Max assist, +2 for physical assistance   General bed mobility comments: OT facilitated rolling towards R x 5 with cues for initiation and technique. Assist for R sided management and trunk for supine <> sit.    Transfers Overall transfer level: Needs assistance Equipment used: 2 person hand held assist Transfers: Sit to/from Stand Sit to Stand: Max assist, +2 physical assistance           General transfer comment: MaxA + 2 to rise to partial standing from edge of bed with left knee block.    Ambulation/Gait               General Gait Details: unable   Stairs             Wheelchair Mobility    Modified Rankin (Stroke Patients Only) Modified Rankin (Stroke Patients Only) Pre-Morbid Rankin Score: No significant disability Modified Rankin: Severe disability     Balance Overall balance assessment: Needs assistance Sitting-balance support: Feet supported, Single extremity supported Sitting balance-Leahy Scale: Poor Sitting balance - Comments: Requires min-modA for sitting balance with left lateral lean. Tactile/verbal cueing for midline, asking pt to "touch" PT shoulder on right side to facilitate. Asked pt to wash bilateral legs with washcloth to promote anterior lean.   Standing balance support: Bilateral upper extremity supported Standing balance-Leahy Scale: Zero                              Cognition Arousal/Alertness: Lethargic Behavior During Therapy: Flat affect Overall Cognitive Status:  Impaired/Different from baseline Area of Impairment: Attention, Memory, Following commands, Safety/judgement, Problem solving                    Current Attention Level: Focused Memory: Decreased short-term memory Following Commands: Follows one step commands consistently, Follows one step commands with increased time Safety/Judgement: Decreased awareness of deficits, Decreased awareness of safety   Problem Solving: Slow processing, Requires verbal cues General Comments: Pt sleepy during session; able to stimulate with verbal cues and more alert sitting edge of bed. Pt able to state full name and birthday; oriented to time of day, but not day of week. Oriented to situation; when asked what stroke affected, pt stating, "everything." severe left neglect.        Exercises      General Comments        Pertinent Vitals/Pain Pain Assessment Pain Assessment: Faces Faces Pain Scale: Hurts little more Pain Location: neck, back Pain Descriptors / Indicators: Discomfort, Grimacing Pain Intervention(s): Monitored during session    Home Living                          Prior Function            PT Goals (current goals can now be found in the care plan section) Acute Rehab PT Goals Potential to Achieve Goals: Fair Progress towards PT goals: Progressing toward goals    Frequency    Min 4X/week      PT Plan Current plan remains appropriate    Co-evaluation PT/OT/SLP Co-Evaluation/Treatment: Yes Reason for Co-Treatment: Complexity of the patient's impairments (multi-system involvement);For patient/therapist safety;To address functional/ADL transfers PT goals addressed during session: Mobility/safety with mobility        AM-PAC PT "6 Clicks" Mobility   Outcome Measure  Help needed turning from your back to your side while in a flat bed without using bedrails?: A Little Help needed moving from lying on your back to sitting on the side of a flat bed without using bedrails?: Total Help needed moving to and from a bed to a chair (including a wheelchair)?: Total Help needed standing up from a chair  using your arms (e.g., wheelchair or bedside chair)?: Total Help needed to walk in hospital room?: Total Help needed climbing 3-5 steps with a railing? : Total 6 Click Score: 8    End of Session Equipment Utilized During Treatment: Gait belt Activity Tolerance: Patient tolerated treatment well Patient left: in bed;with call bell/phone within reach;with bed alarm set;with family/visitor present Nurse Communication: Mobility status PT Visit Diagnosis: Hemiplegia and hemiparesis;Difficulty in walking, not elsewhere classified (R26.2);Unsteadiness on feet (R26.81) Hemiplegia - Right/Left: Left Hemiplegia - dominant/non-dominant: Non-dominant Hemiplegia - caused by: Cerebral infarction     Time: 7517-0017 PT Time Calculation (min) (ACUTE ONLY): 33 min  Charges:  $Therapeutic Activity: 8-22 mins                     Wyona Almas, PT, DPT Acute Rehabilitation Services Office [phone]    Deno Etienne 02/03/2022, 8:53 AM

## 2022-02-03 NOTE — Progress Notes (Signed)
Telemetry reviewed.  Remains in NSR with rate 50s-70s.  Few brief episodes of SVT, would avoid AV nodal blockers.  Bradycardia continues to improve as she recovers from her CVA and carotid intervention.  Donato Heinz, MD

## 2022-02-03 NOTE — Progress Notes (Signed)
Notified Dr. Leonel Ramsay for pt's blood sugar being in the 160's after tube feeds were started yesterday.  Asked about starting pt on SSI.  No new orders at this time.

## 2022-02-03 NOTE — Progress Notes (Signed)
Inpatient Rehab Admissions Coordinator:   Per therapy recommendations, patient was screened for CIR candidacy by Clemens Catholic, MS, CCC-SLP. At this time, Pt. is not at a level to tolerate the intensity of CIR.  Pt. may have potential to progress to becoming a potential CIR candidate, so CIR admissions team will follow and monitor for progress and participation with therapies and place consult order if Pt. appears to be an appropriate candidate. Please contact me with any questions.   Clemens Catholic, [place], [place] Admissions Coordinator  [phone] ([place]) [phone] (office)

## 2022-02-04 ENCOUNTER — Ambulatory Visit: Payer: Medicare Other

## 2022-02-04 DIAGNOSIS — I639 Cerebral infarction, unspecified: Secondary | ICD-10-CM | POA: Diagnosis not present

## 2022-02-04 LAB — URINALYSIS, ROUTINE W REFLEX MICROSCOPIC
Bilirubin Urine: NEGATIVE
Glucose, UA: NEGATIVE mg/dL
Ketones, ur: NEGATIVE mg/dL
Nitrite: NEGATIVE
Protein, ur: 100 mg/dL — AB
Specific Gravity, Urine: 1.006 (ref 1.005–1.030)
WBC, UA: >50 WBC/hpf — ABNORMAL HIGH (ref 0–5)
pH: 7 (ref 5.0–8.0)

## 2022-02-04 LAB — GLUCOSE, CAPILLARY
Glucose-Capillary: 143 mg/dL — ABNORMAL HIGH (ref 70–99)
Glucose-Capillary: 147 mg/dL — ABNORMAL HIGH (ref 70–99)
Glucose-Capillary: 147 mg/dL — ABNORMAL HIGH (ref 70–99)
Glucose-Capillary: 148 mg/dL — ABNORMAL HIGH (ref 70–99)
Glucose-Capillary: 160 mg/dL — ABNORMAL HIGH (ref 70–99)
Glucose-Capillary: 165 mg/dL — ABNORMAL HIGH (ref 70–99)

## 2022-02-04 LAB — MAGNESIUM: Magnesium: 2.4 mg/dL (ref 1.7–2.4)

## 2022-02-04 LAB — PHOSPHORUS: Phosphorus: 2.8 mg/dL (ref 2.5–4.6)

## 2022-02-04 MED ORDER — FUROSEMIDE 10 MG/ML IJ SOLN
20.0000 mg | Freq: Once | INTRAMUSCULAR | Status: AC
  Administered 2022-02-04: 20 mg via INTRAVENOUS
  Filled 2022-02-04: qty 2

## 2022-02-04 MED ORDER — AMANTADINE HCL 50 MG/5ML PO SOLN
100.0000 mg | Freq: Two times a day (BID) | ORAL | Status: DC
  Administered 2022-02-06 – 2022-02-09 (×7): 100 mg
  Filled 2022-02-04 (×9): qty 10

## 2022-02-04 MED ORDER — TOPIRAMATE 25 MG PO TABS
50.0000 mg | ORAL_TABLET | Freq: Two times a day (BID) | ORAL | Status: DC
  Administered 2022-02-04 – 2022-02-10 (×12): 50 mg
  Filled 2022-02-04 (×12): qty 2

## 2022-02-04 MED ORDER — POLYETHYLENE GLYCOL 3350 17 G PO PACK
17.0000 g | PACK | Freq: Two times a day (BID) | ORAL | Status: DC
  Administered 2022-02-04 – 2022-02-09 (×7): 17 g
  Filled 2022-02-04 (×11): qty 1

## 2022-02-04 MED ORDER — TOPIRAMATE 25 MG PO TABS
25.0000 mg | ORAL_TABLET | Freq: Once | ORAL | Status: AC
  Administered 2022-02-04: 25 mg
  Filled 2022-02-04: qty 1

## 2022-02-04 MED ORDER — SODIUM CHLORIDE 0.9 % IV SOLN
1.0000 g | INTRAVENOUS | Status: DC
  Administered 2022-02-04 – 2022-02-09 (×6): 1 g via INTRAVENOUS
  Filled 2022-02-04 (×6): qty 10

## 2022-02-04 MED ORDER — TRAMADOL HCL 50 MG PO TABS
25.0000 mg | ORAL_TABLET | Freq: Once | ORAL | Status: AC
  Administered 2022-02-04: 25 mg
  Filled 2022-02-04: qty 1

## 2022-02-04 NOTE — Progress Notes (Addendum)
STROKE TEAM PROGRESS NOTE   INTERVAL HISTORY Husband at the bedside. Diet upgraded to mechanical soft, pills either crushed in applesauce or through cortrak. Increased topamax today, one time does of ultram for headache. CIR pending, she does have transfer orders out of ICU Neurologically stable, hemodynamically improved, not episodes of severe hypotension or bradycardia. Able to half dose losartan today. One dose of lasix given yesterday as she was 4L positive in I&Os. One more dose today, RN to bladder scan as well.  Consider starting amantadine tomorrow for wakefulness? She continues to fall asleep while eating.   Update- she is retaining- bladder scan greater than 988m- RN to in and out cath   Vitals:   02/04/22 1300 02/04/22 1400 02/04/22 1500 02/04/22 1600  BP: (!) 140/63 (!) 151/52 123/61   Pulse: 73 71 63   Resp: 20 (!) 21 16   Temp:    98.5 F (36.9 C)  TempSrc:    Axillary  SpO2: 92% 97% 92%   Weight:      Height:       CBC:  Recent Labs  Lab 01/29/22 1107 01/29/22 1115 01/30/22 0933 01/31/22 0417  WBC 6.7  --  11.9* 9.9  NEUTROABS 3.9  --   --   --   HGB 12.7   < > 10.2* 10.3*  HCT 36.8   < > 31.0* 30.7*  MCV 94.6  --  96.6 93.3  PLT 196  --  186 171   < > = values in this interval not displayed.   Basic Metabolic Panel:  Recent Labs  Lab 01/30/22 1000 01/30/22 1105 01/31/22 0417 02/02/22 1814 02/03/22 1925 02/04/22 0443  NA 143  --  142  --   --   --   K 3.7  --  3.8  --   --   --   CL 119*  --  120*  --   --   --   CO2 17*  --  17*  --   --   --   GLUCOSE 104*  --  105*  --   --   --   BUN 19  --  18  --   --   --   CREATININE 1.22*  --  1.13*  --   --   --   CALCIUM 7.5*  --  7.8*  --   --   --   MG 2.1   < > 2.2   < > 2.2 2.4  PHOS  --   --  2.1*   < > 3.1 2.8   < > = values in this interval not displayed.   Lipid Panel:  Recent Labs  Lab 01/30/22 0639  CHOL 169  TRIG 96  HDL 25*  CHOLHDL 6.8  VLDL 19  LDLCALC 125*   HgbA1c:  Recent  Labs  Lab 01/29/22 1107  HGBA1C 5.4   Urine Drug Screen:  Recent Labs  Lab 01/29/22 2215  LABOPIA NONE DETECTED  COCAINSCRNUR NONE DETECTED  LABBENZ NONE DETECTED  AMPHETMU NONE DETECTED  THCU NONE DETECTED  LABBARB NONE DETECTED    Alcohol Level  Recent Labs  Lab 01/29/22 1107  ETH <10    IMAGING past 24 hours No results found.  Physical Exam  Constitutional: Appears well-developed and well-nourished elderly Caucasian lady.   Cardiovascular: Normal rate and regular rhythm.  Respiratory: Effort normal, non-labored breathing  Neuro: Mental Status: Patient is drowsy, but, oriented to person, place, month, year, and situation  Right gaze preference, comes to midline. Follows commands speech is hesitant with some dysarthria but at times gibberish difficult to understand Cranial Nerves: II: Right gaze preference, unable to look to the left III,IV, VI: EOMI without ptosis or diploplia..  Blinks to threat on the right and not on the left. V: Facial sensation is symmetric to temperature VII: Right facial droop  VIII: Hearing is intact to voice X: voice is hypophonic  XI: Shoulder shrug is symmetric. XII: Tongue protrudes midline without atrophy or fasciculations.  Motor: Tone is normal. Bulk is normal.  Right full strength LLE 2/5 LUE 0/5 Sensory: No sensory deficit noted in any extremity  Cerebellar: FNF and HKS are intact bilaterally   ASSESSMENT/PLAN Julia Deleon is a 76 y.o. female with history of arthritis, CKD, COPD, Fuch's corneal endothelial dystrophy, HLD, HTN and hypothyroidism who presents to the ED via EMS as a Code Stroke with acute onset of left sided weakness, left facial droop and dysarthria with neglect. Initially symptoms improved in IR suite after TNK and she was taken back to her ED room. She then had a change in neurological exam and was reactivated as a Code IR.  Stroke: Right MCA infarct s/p TNK and mechanical thrombectomy with TICI2c  revascularization  Etiology:  atherosclerosis and stenosis of intracranial vessels   Code Stroke CT head Loss of gray-white differentiation in the posterior right insular cortex and right lentiform nucleus compatible with acute/subacute right MCA infarct. Aspects 8/10 CTA head & neck Acute occlusion of the proximal right M1. Severe stenosis at the origin of the right ICA (>75%). Repeat CT Head Sequelae of interval stenting of the right ICA and MCA. Scattered hyper density involving the right anterior and medial frontal region as well as the right sylvian fissure, similar to prior intra procedural CT. While this finding almost certainly reflects a degree of contrast staining, superimposed subarachnoid hemorrhage at the right sylvian fissure not excluded. Underlying evolving right MCA territory infarct. Post IR CT contrast staining in the basal ganglia and right medial frontal cortex as well as trace hyperdensity in the right sylvian fissure. MRI Pre procedure- Acute/subacute nonhemorrhagic infarct involving the posterior right parietal cortex  MRA  Pre procedure 1. Moderate stenosis of the right cavernous ICA. 2. Mild narrowing of the left cavernous ICA. 3. High-grade right M1 segment stenosis with asymmetric attenuation of distal MCA branch vessels. 4. Mild narrowing of the proximal right A1 segment. 5. Mild right P2 segment stenosis. 6. The left vertebral artery is the dominant vessel. 2D Echo EF 55-60% LDL 125 HgbA1c 5.4 VTE prophylaxis - SCDs    Diet   DIET DYS 3 Room service appropriate? Yes with Assist; Fluid consistency: Honey Thick   No antithrombotic prior to admission, now on aspirin 81 mg daily and Brilinta (ticagrelor) 90 mg bid.  Therapy recommendations:  Pending Disposition:  Pending  Mechanical Thrombectomy with cervical carotid angioplasty and stent placement Mechanical thrombectomy performed with direct contact aspiration with recanalization of the M1 segment with residual  atherosclerotic stenosis. Mechanical thrombectomy performed with stent retriever with recanalization of M2  (TICI 2C).  [facility] post procedure- IR following for stabilization so this can be coiled once she is stabilized  Hx of hypertension now hypotensive with bradycardia requiring vasopressors Home meds:  Losartan '50mg'$  Stable- resumed losartan at '25mg'$  BP goal less than 160  Hyperlipidemia Home meds:  None, LDL 125, goal < 70 add when able to take PO- reports allergy to simvastatin  Dysphagia NG tube  in place  SLP following   Other Stroke Risk Factors Advanced Age >/= 84   Other Active Problems CKD Stage 3 Cr and GFR stable  Intermittent bradycardia with hypotension Now on dopamine Cardiology consulted CCM consulted Hold antihypertensive medications PICC line placed for vasopressors  Headache Added tylenol with codeine PRN Topamax '25mg'$  BID -> increased to '50mg'$   One time dose of Ultram today COPD Not in acute exacerbation  Urinary retention Bladder scan with >976m One dose of lasix yesterday due to wheezing In and out cath, may start retention medications   Hospital day # 6  Patient seen and examined by NP/APP with MD. MD to update note as needed.   DJanine Ores DNP, FNP-BC [facility] Pager: [phone] I have personally obtained history,examined this patient, reviewed notes, independently viewed imaging studies, participated in medical decision making and plan of care.ROS completed by me personally and pertinent positives fully documented  I have made any additions or clarifications directly to the above note. Agree with note above.  Patient neurological exam remains mostly unchanged with she is able to to swallow now though she remains drowsy.  Continue mobilization out of bed.  Therapy consults.  Transfer to neurology floor bed when available.  Hopefully transfer to rehab in the next few days after insurance approval and bed availability.  Long  discussion with patient and husband at the bedside and answered questions.This patient is critically ill and at significant risk of neurological worsening, death and care requires constant monitoring of vital signs, hemodynamics,respiratory and cardiac monitoring, extensive review of multiple databases, frequent neurological assessment, discussion with family, other specialists and medical decision making of high complexity.I have made any additions or clarifications directly to the above note.This critical care time does not reflect procedure time, or teaching time or supervisory time of PA/NP/Med Resident etc but could involve care discussion time.  I spent 30 minutes of neurocritical care time  in the care of  this patient.      PAntony Contras MD Medical Director MCutterPager: [phone]/28/2023 5:45 PM    To contact Stroke Continuity provider, please refer to [email or web address] After hours, contact General Neurology

## 2022-02-04 NOTE — Progress Notes (Signed)
Physical Therapy Treatment Patient Details Name: Julia Deleon MRN: [id number] DOB: 01-28-1946 Today's Date: 02/04/2022   History of Present Illness Patient is a 76 y/o female who presents on 9/22 with left sided weakness and slurred speech. Found to have Rt M1 occlusion s/p TNK and mechanical thrombectomy 9/22. Developed bradycardia with hypotension in ICU.  PMH includes COPD, back surgery, CKD.    PT Comments    The pt was agreeable to session despite intermittent lethargy originally. The pt was able to answer all questions and follow commands despite occasionally maintaining eyes closed. RN performed I&O cath with >2L urine output during session, pt reports feeling "better" after. The pt was then able to complete transfer to sitting EOB but required maxA of 1 and remained dependent on assist to maintain static sitting balance (posterior and L lateral lean with no initiation to correct when assist lessened). Further mobility limited by lack of appropriate assistance and soft BP after significant urine output. Will continue to benefit from skilled PT to further progress OOB mobility and strength.   VITALS:  - supine in bed- BP: 130/57 (78);  - sitting EOB - BP: 119/57 (74); - sitting EOB - BP: 114/55 (73); - sitting EOB - BP: 123/49 (68);  - supine end of session - 116/55 (73)    Recommendations for follow up therapy are one component of a multi-disciplinary discharge planning process, led by the attending physician.  Recommendations may be updated based on patient status, additional functional criteria and insurance authorization.  Follow Up Recommendations  Acute inpatient rehab (3hours/day)     Assistance Recommended at Discharge Frequent or constant Supervision/Assistance  Patient can return home with the following Two people to help with walking and/or transfers;A lot of help with bathing/dressing/bathroom;Assistance with cooking/housework;Direct supervision/assist for medications  management;Assist for transportation;Help with stairs or ramp for entrance   Equipment Recommendations  Other (comment) (defer)    Recommendations for Other Services       Precautions / Restrictions Precautions Precautions: Fall;Other (comment) Precaution Comments: NGT, L hemiparesis Restrictions Weight Bearing Restrictions: No     Mobility  Bed Mobility Overal bed mobility: Needs Assistance Bed Mobility: Rolling, Sidelying to Sit, Sit to Supine Rolling: Mod assist Sidelying to sit: Max assist   Sit to supine: Max assist   General bed mobility comments: assist to complete with maxA to complete transition with only 1 person. pt able to initiate return to supine at end of session    Transfers Overall transfer level: Needs assistance Equipment used: 1 person hand held assist Transfers: Sit to/from Stand Sit to Stand: Max assist           General transfer comment: unable to achieve more than hip lift despite maxA, pt attempting to assist with RLE but unable to maintain    Ambulation/Gait               General Gait Details: unable    Modified Rankin (Stroke Patients Only) Modified Rankin (Stroke Patients Only) Pre-Morbid Rankin Score: No significant disability Modified Rankin: Severe disability     Balance Overall balance assessment: Needs assistance Sitting-balance support: Feet supported, Single extremity supported Sitting balance-Leahy Scale: Poor Sitting balance - Comments: Requires min-modA for sitting balance with left lateral lean. Tactile/verbal cueing for midline, asking pt to "touch" PT shoulder on right side to facilitate. pt also able to visually scan to L with max cues and min overpressure to assist cervical rotation   Standing balance support: Bilateral upper extremity supported Standing  balance-Leahy Scale: Zero                              Cognition Arousal/Alertness: Lethargic Behavior During Therapy: Flat affect Overall  Cognitive Status: Impaired/Different from baseline Area of Impairment: Attention, Memory, Following commands, Safety/judgement, Problem solving                   Current Attention Level: Focused Memory: Decreased short-term memory Following Commands: Follows one step commands consistently, Follows one step commands with increased time Safety/Judgement: Decreased awareness of deficits, Decreased awareness of safety   Problem Solving: Slow processing, Requires verbal cues General Comments: Pt sleepy during session; able to verbally respond and initially maintain eyes open to verbal stimulation. pt answering questions even when eyes closed. severe L neglect but can work to attend at midline when cued.        Exercises Other Exercises Other Exercises: cervical rotation to L x5 Other Exercises: supine bridging with BLE supported x5    General Comments General comments (skin integrity, edema, etc.): >2L urine output via I&O cath performed by RN during session. pt reports feeling better after      Pertinent Vitals/Pain Pain Assessment Pain Assessment: Faces Faces Pain Scale: Hurts little more Pain Location: neck, back Pain Descriptors / Indicators: Discomfort, Grimacing Pain Intervention(s): Monitored during session, Repositioned (pt reports better after I&O cath)     PT Goals (current goals can now be found in the care plan section) Acute Rehab PT Goals Patient Stated Goal: none stated PT Goal Formulation: Patient unable to participate in goal setting Time For Goal Achievement: 02/14/22 Potential to Achieve Goals: Fair Progress towards PT goals: Progressing toward goals    Frequency    Min 4X/week      PT Plan Current plan remains appropriate       AM-PAC PT "6 Clicks" Mobility   Outcome Measure  Help needed turning from your back to your side while in a flat bed without using bedrails?: A Little Help needed moving from lying on your back to sitting on the  side of a flat bed without using bedrails?: Total Help needed moving to and from a bed to a chair (including a wheelchair)?: Total Help needed standing up from a chair using your arms (e.g., wheelchair or bedside chair)?: Total Help needed to walk in hospital room?: Total Help needed climbing 3-5 steps with a railing? : Total 6 Click Score: 8    End of Session Equipment Utilized During Treatment: Gait belt Activity Tolerance: Patient tolerated treatment well Patient left: in bed;with call bell/phone within reach;with bed alarm set;with family/visitor present Nurse Communication: Mobility status PT Visit Diagnosis: Hemiplegia and hemiparesis;Difficulty in walking, not elsewhere classified (R26.2);Unsteadiness on feet (R26.81) Hemiplegia - Right/Left: Left Hemiplegia - dominant/non-dominant: Non-dominant Hemiplegia - caused by: Cerebral infarction     Time: 7591-6384 PT Time Calculation (min) (ACUTE ONLY): 36 min  Charges:  $Therapeutic Exercise: 8-22 mins $Therapeutic Activity: 8-22 mins                     Thane Carbo, PT, DPT   Acute Rehabilitation Department   Sandra Cockayne 02/04/2022, 3:52 PM

## 2022-02-04 NOTE — TOC Initial Note (Addendum)
Transition of Care (TOC) - Initial/Assessment Note    Patient Details  Name: Julia Deleon MRN: [id number] Date of Birth: 11/26/1945  Transition of Care (TOC) CM/SW Contact:    Nadia S Rayyan, LCSW Phone Number: 02/04/2022, 12:14 PM  Clinical Narrative:                 CSW received consult for possible SNF placement at time of discharge if CIR is unable to accept patient. CSW spoke with patient's spouse. Spouse reported that he is currently unable to care for patient at their home given patient's current physical needs and fall risk. He expressed understanding of PT recommendation and is agreeable to SNF placement at time of discharge. Patient reports preference for [place] [facility] and requested CSW speak with patient's sister. CSW discussed insurance authorization process and will provide Medicare SNF ratings list. CSW will send out referrals for review once cortrak is removed and will provide bed offers as available.   CSW met with patient's sister and daughter at bedside and explained SNF process. They are hopeful that patient can progress to CIR level but are aware SNF may be needed.   Skilled Nursing Rehab Facilities-   https://www.medicare.gov/care-compare/   Ratings out of 5 stars (the highest)   Name Address  Phone # Quality Care Staffing Health Inspection Overall  [facility] [address], [place] [phone] 5 5 2 4  [facility]  [address], [place] [phone] 4 2 5 5  [facility] [address], [place] [phone] 1 1 1 1  [facility] [address], [place] [phone] 2 2 4 4  [facility] [address], [place] [phone] 1 1 2 1  [facility] [address], [place] [phone] 3 2 4 4  [facility] [address], [place] [phone] 5 1 3 3  [facility] [address], [place] [phone] 5 2 3 4  [facility] ([facility]) [address], [place]  [phone] 4 1 2 1  [facility] [address], [place] [phone] 4 1 1 1  [facility] [address], [place] [phone] 3 1 2 1  [facility]) [address], [place] [phone] 4 1 1 1  [facility] [address], [place] [phone] 4 2 4 4          [facility] [address], [place] [phone]      [facility] [address], [place] [phone] 4 1 3 2  [facility] [address], [place] [phone] 3 1 5 4  [facility], [place] [address], [place] [phone] 1 1 1 1  [facility] [address], [place] [phone] 2 2 3 3          [facility] (no UHC) [address], [place] [phone] 5 5 5 5  [facility] (No Humana) [address], [place] [phone] 4 1 4 3  [facility] (No UHC) [address], [place] [phone] 5 5 5 5  [facility] [address], [place] [phone] 2 3 5 5  [facility] [address], [place] [phone] 1 1 2 1  [facility] [address], [place] [phone] 3 1 1 1  [facility] [address], [place] [phone] 5 2 4 5  [facility]  [address], [place] [phone] 2 2 1 1  [facility] [address], [place] [phone] 3   _0 [facility] [place], [place] _1 [facility] [address] [place] _2 [facility] [address], [place]  [phone] _3 [facility] [address], [place] [phone] _4 [facility] [address] [place] _5 [facility] (No Humana) [address], [place] _6 [facility] [address], [place] [phone] _7 [facility] [place], [place] _8 [facility])  [address], [place] _9 [facility] [place] [address] [place] _10 [facility] [address] [place] _11 [address] [place], [place] _12 [facility]) [place], [place] _13 Barriers to Discharge: Continued Medical Work up, SNF Pending bed offer   Patient Goals and CMS Choice Patient states their goals for this hospitalization and ongoing recovery are:: Rehab CMS Medicare.gov Compare Post Acute Care list provided to:: Patient Represenative (must comment) Choice offered to / list presented to : Spouse, Patient  Expected Discharge Plan and Services   In-house Referral: Clinical Social Work   Post Acute Care Choice: [place] arrangements for the past 2 months: Single Family Home                                      Prior Living Arrangements/Services Living arrangements for the past 2 months: Single Family Home Lives with:: Spouse Patient language and need for interpreter reviewed:: Yes Do you feel safe going back to the place where you live?: Yes      Need for Family Participation in Patient Care: Yes (Comment) Care giver support system in place?: Yes (comment)   Criminal Activity/Legal Involvement Pertinent to Current Situation/Hospitalization: No - Comment as needed  Activities of Daily Living      Permission Sought/Granted Permission sought to share information with : Facility Sport and exercise psychologist, Family Supports Permission granted to share information with : Yes, Verbal Permission Granted  Share Information with NAME: Elenore Rota  Permission granted to share info w AGENCY: SNFs  Permission granted to share info w Relationship: Spouse  Permission granted to share info w Contact Information: [phone]  Emotional Assessment Appearance:: Appears stated  age Attitude/Demeanor/Rapport: Engaged Affect (typically observed): Accepting, Appropriate Orientation: : Oriented to Self, Oriented to Place, Oriented to  Time, Oriented to Situation Alcohol / Substance Use: Not Applicable Psych Involvement: No (comment)  Admission diagnosis:  Stroke (cerebrum) ([place]) [I63.9] Acute ischemic stroke [facility]) [I63.9] Patient Active Problem List   Diagnosis Date Noted   Hypotension 01/30/2022   Bradycardia 01/30/2022   Acute ischemic stroke ([place]) 01/29/2022   Essential hypertension 01/23/2021   Cystocele with prolapse 07/30/2020   Postsurgical arthrodesis status 07/25/2020   Facet arthropathy 04/08/2020   Family history of breast cancer 11/16/2019   Body mass index (BMI) 25.0-25.9, adult 07/24/2019   Chronic UTI 12/06/2018   Status post lumbar surgery 12/06/2018  Spondylolisthesis, lumbar region 09/26/2018   Aortic atherosclerosis ([place]) 08/31/2018   Food sensitivity headache 03/29/2018   CAD (coronary artery disease) 02/23/2018   Advanced care planning/counseling discussion 03/28/2017   Varicose veins of both lower extremities with complications 62/56/3893   Arthritis 12/09/2016   Vaginal atrophy 09/10/2015   Vasomotor symptoms due to menopause 09/10/2015   Onychomycosis due to dermatophyte 04/07/2015   Bursitis of right shoulder 02/26/2015   Environmental and seasonal allergies 02/26/2015   Other allergic rhinitis 02/26/2015   Osteoporosis 11/26/2014   COPD (chronic obstructive pulmonary disease) ([place]) 11/26/2014   Fuchs' corneal dystrophy 11/26/2014   Benign hypertensive renal disease 11/26/2014   Hyperlipidemia 11/26/2014   Chronic kidney disease, stage III (moderate) ([place]) 11/26/2014   Hypothyroidism 11/26/2014   Endothelial corneal dystrophy 11/26/2014   PCP:  Idelle Crouch, MD Pharmacy:   [facility] [address], [place] [address] Phone: [phone] Fax: [place], [place] [address] [place] [address] Phone: [phone] Fax: [phone]  [place], [place] [address] Phone: [phone] Fax: [phone] [place], [place] [address] Phone: [phone] Fax: [phone]  [id number] [place] [address] [address]Phone: [phone]: [phone]    Social Determinants of Health (SDOH) Interventions    Readmission Risk Interventions     No data to display

## 2022-02-04 NOTE — Progress Notes (Signed)
Modified Barium Swallow Assessment   02/03/22  HPI:  Patient is a 76 y/o female who presents on 9/22 with left sided weakness and slurred speech. Found to have Rt M1 occlusion s/p TNK and mechanical thrombectomy 9/22. Developed bradycardia with hypotension in ICU.  CT 9/22 Multifocal right hemisphere cytotoxic edema, including new  involvement of the right inferior frontal gyrus, right basal  ganglia, and the superior perirolandic cortex;  Trace IVH and SAH  PMH includes COPD, back surgery (L4-5 fusion), CKD  Clinical impression:     Pt presents with a mild oropharyngeal dysphagia c/b delayed swallow intiation, reduced tongue base rectraction, incomplete laryngeal closure, and diminished sensation.  These deficits resulted in silent penetration of thin and nectar thick liquids during the swallow.  Majority of penetration was spontaneously ejected from the laryngeal vestibute, but a small amount remained on anterior surface of laryngeal vestibule, with intermittent progression to vocal folds without cough reflex observed.  Cued cough was inconsistently beneficial to clear penetration.  Following liquid with heavier puree bolus reduced remaining penetration.  Pt was unable to execute chin tuck strategy during bolus trials today.  Honey thick liquid prevented penetration, including by straw sips.  There was no penetration or aspiration of puree or regular solid consistencies and oral phase was prompt with regular solid.  Pt was asleep on arrival, but able to rouse for study; however, she became drowsy over the course of trials.  Pt may have difficulty maintaining wakefullness for full meals.  Pt would benefit from 1:1 supervision with PO intake to ensure alert state and attention to POs.  Recommend mechanical soft texture diet with honey thick liquid by cup or straw.  Pt may have small sips of unthickneded water only, in moderation, in between meals, after good oral care, by spoon or straw with family or staff  pinching straw and removing it to restrict bolus flow, when fully awake/alert, with upright positioning and supervision.  Shared results and recommendation with husband who was in room after completion of evaluation   Recommendation: Dys 3, honey thick liquids via cup or straw, may have sips unthickened water in between meals and after oral care vis spoon or straw with family or staff pinching straw or removing it to restrict bolus flow

## 2022-02-04 NOTE — Progress Notes (Signed)
Nutrition Brief Note  Pt discussed during ICU rounds and with RN and MD.  Pt had cortrak tube placed 9/26 on continuous TF.  Diet advanced 9/28 to Dysphagia 3 with Honey thickened liquids Per RN pt only took 3 bites of egg and this took 30 mintues and a lot of encouragement to keep pt awake.  Spoke with daughter and sister. They have been able to get pt to take a few sips of thickened liquids.   INTERVENTION:   - Continue continues TF via Cortrak tube      Osmolite 1.5 @ 55 ml/hr (Provides: 1980 kcal and 83 grams protein)   -Once pt more awake and able to eat more at meal time will plan to adjust to nocturnal TF  Joshlynn Alfonzo P., RD, LDN, CNSC See AMiON for contact information

## 2022-02-04 NOTE — Progress Notes (Signed)
Telemetry reviewed.  Remains in sinus bradycardia, rates primarily in 50s.  No concerning bradyarrhythmias. No further SVT seen.  Continue to avoid AV nodal blockers.  Suspect bradycardia will continue to improve as she recovers from CVA and carotid intervention.  Donato Heinz, MD

## 2022-02-04 NOTE — Progress Notes (Signed)
Speech Language Pathology Treatment: Dysphagia;Cognitive-Linquistic  Patient Details Name: Julia Deleon MRN: [id number] DOB: 07/30/1945 Today's Date: 02/04/2022 Time: 8182-9937 SLP Time Calculation (min) (ACUTE ONLY): 12 min  Assessment / Plan / Recommendation Clinical Impression  Pt seen for dysphagia and cognitive linguistic treatment. Pt initially lethargic and significantly confused but was easily roused prior to start of session. As the session progressed she became more aware of situation. Pt produced significantly more verbal output than previous session. Her speech is intelligible through all utterances lengths. Pt able to name familiar people and respond to simple questions. She accurately responded to memory questions indicating short term memory is at least partially intact. Pt observed with trials of honey thick liquids via straw and regular consistency. She intermittently displayed an immediate cough and throat clear with honey thick liquids, and min oral residue was noted with regular consistency. Pt required moderate cuing throughout to properly use straw and attend to the task. Pt educated on lingual sweep on left side during meals. Recommend pt continue honey thick/dys 3 diet with full supervision to monitor alertness and cue for strategies. STwill follow for cognitive linguistic and dysphagia goals.   HPI HPI: 76 year old female patient who was admitted on 9/22 for acute onset left-sided weakness, left facial droop, dysarthria and neglect. CT angiogram of head and neck showed acute occlusion of proximal right M1 and nonopacification of right M2/M3, severe stenosis at right ICA. Pt now s/p TNK and thrombectomy. Bradycardia and hypotention noted post op.      SLP Plan  Continue with current plan of care      Recommendations for follow up therapy are one component of a multi-disciplinary discharge planning process, led by the attending physician.  Recommendations may be updated based  on patient status, additional functional criteria and insurance authorization.    Recommendations  Diet recommendations: Honey-thick liquid;Dysphagia 3 (mechanical soft) Liquids provided via: Straw;Cup Medication Administration: Crushed with puree Supervision: Full supervision/cueing for compensatory strategies Compensations: Small sips/bites;Slow rate;Lingual sweep for clearance of pocketing;Minimize environmental distractions Postural Changes and/or Swallow Maneuvers: Seated upright 90 degrees                Oral Care Recommendations: Oral care BID Follow Up Recommendations: Acute inpatient rehab (3hours/day) Assistance recommended at discharge: Frequent or constant Supervision/Assistance SLP Visit Diagnosis: Dysphagia, oropharyngeal phase (R13.12);Cognitive communication deficit (R41.841) Plan: Continue with current plan of care           [facility] Student SLP   02/04/2022, 10:02 AM

## 2022-02-04 NOTE — NC FL2 (Addendum)
[place] LEVEL OF CARE SCREENING TOOL     IDENTIFICATION  Patient Name: Julia Deleon Birthdate: [birth year] Sex: female Admission Date (Current Location): 01/29/2022  [facility] and [place] Number:  Engineering geologist and Address:  [place] [address] [facility], [place] [address] [place],  [address]      Provider Number: [id number]  Attending Physician Name and Address:  Stroke, Md, MD  Relative Name and Phone Number:       Current Level of Care: Hospital Recommended Level of Care: [place] Prior Approval Number:    Date Approved/Denied:   PASRR Number: [id number]  Discharge Plan: SNF    Current Diagnoses: Patient Active Problem List   Diagnosis Date Noted   Hypotension 01/30/2022   Bradycardia 01/30/2022   Acute ischemic stroke ([place]) 01/29/2022   Essential hypertension 01/23/2021   Cystocele with prolapse 07/30/2020   Postsurgical arthrodesis status 07/25/2020   Facet arthropathy 04/08/2020   Family history of breast cancer 11/16/2019   Body mass index (BMI) 25.0-25.9, adult 07/24/2019   Chronic UTI 12/06/2018   Status post lumbar surgery 12/06/2018   Spondylolisthesis, lumbar region 09/26/2018   Aortic atherosclerosis ([place]) 08/31/2018   Food sensitivity headache 03/29/2018   CAD (coronary artery disease) 02/23/2018   Advanced care planning/counseling discussion 03/28/2017   Varicose veins of both lower extremities with complications 55/37/4827   Arthritis 12/09/2016   Vaginal atrophy 09/10/2015   Vasomotor symptoms due to menopause 09/10/2015   Onychomycosis due to dermatophyte 04/07/2015   Bursitis of right shoulder 02/26/2015   Environmental and seasonal allergies 02/26/2015   Other allergic rhinitis 02/26/2015   Osteoporosis 11/26/2014   COPD (chronic obstructive pulmonary disease) ([place]) 11/26/2014   Fuchs' corneal dystrophy 11/26/2014   Benign hypertensive renal disease 11/26/2014   Hyperlipidemia 11/26/2014    Chronic kidney disease, stage III (moderate) ([place]) 11/26/2014   Hypothyroidism 11/26/2014   Endothelial corneal dystrophy 11/26/2014    Orientation RESPIRATION BLADDER Height & Weight     Self, Time, Situation, Place  Normal Incontinent, External catheter Weight: 167 lb 15.9 oz (76.2 kg) Height:  '5\' 4"'$  (162.6 cm)  BEHAVIORAL SYMPTOMS/MOOD NEUROLOGICAL BOWEL NUTRITION STATUS      Continent Diet (See dc summary)  AMBULATORY STATUS COMMUNICATION OF NEEDS Skin   Extensive Assist Verbally Surgical wounds (Closed incision on thigh)                       Personal Care Assistance Level of Assistance  Bathing, Feeding, Dressing Bathing Assistance: Maximum assistance Feeding assistance: Limited assistance Dressing Assistance: Limited assistance     Functional Limitations Info             SPECIAL CARE FACTORS FREQUENCY  PT (By licensed PT), OT (By licensed OT), Speech therapy     PT Frequency: 5x/week OT Frequency: 5x/week     Speech Therapy Frequency: 2x/week      Contractures Contractures Info: Not present    Additional Factors Info  Code Status, Allergies Code Status Info: Full Allergies Info: Colesevelam, Levofloxacin, Amoxicillin, Benazepril, Clavulanic Acid, Methylisothiazolinone, Thimerosal (Thiomersal), Amoxicillin-pot Clavulanate, Latex, Simvastatin           Current Medications (02/04/2022):  This is the current hospital active medication list Current Facility-Administered Medications  Medication Dose Route Frequency Provider Last Rate Last Admin   0.9 %  sodium chloride infusion  250 mL Intravenous Continuous Erick Colace, NP   Held at 01/30/22 1037   acetaminophen (TYLENOL) tablet  650 mg  650 mg Oral Q4H PRN de Sindy Messing, Erven Colla, MD       Or   acetaminophen (TYLENOL) 160 MG/5ML solution 650 mg  650 mg Per Tube Q4H PRN de Sindy Messing, Erven Colla, MD   650 mg at 02/04/22 0736   Or   acetaminophen (TYLENOL) suppository 650 mg  650 mg Rectal  Q4H PRN de Sindy Messing, Erven Colla, MD   650 mg at 01/29/22 2121   acetaminophen-codeine (TYLENOL #3) 300-30 MG per tablet 2 tablet  2 tablet Per Tube Q6H PRN Henri Medal, RPH   2 tablet at 02/04/22 0304   albuterol (PROVENTIL) (2.5 MG/3ML) 0.083% nebulizer solution 2.5 mg  2.5 mg Nebulization Q6H PRN Janine Ores, NP       aspirin chewable tablet 81 mg  81 mg Per Tube Daily Henri Medal, RPH   81 mg at 02/04/22 1443   Chlorhexidine Gluconate Cloth 2 % PADS 6 each  6 each Topical Q0600 Kerney Elbe, MD   6 each at 02/03/22 1245   clevidipine (CLEVIPREX) infusion 0.5 mg/mL  0-21 mg/hr Intravenous Continuous de Sindy Messing, Erven Colla, MD       feeding supplement (OSMOLITE 1.5 CAL) liquid 1,000 mL  1,000 mL Per Tube Continuous Garvin Fila, MD 55 mL/hr at 02/04/22 1200 Infusion Verify at 02/04/22 1200   heparin injection 5,000 Units  5,000 Units Subcutaneous Q8H Shafer, Marcelino Scot, NP   5,000 Units at 02/04/22 1540   hydrALAZINE (APRESOLINE) injection 10 mg  10 mg Intravenous Q4H PRN Garvin Fila, MD       levothyroxine (SYNTHROID) tablet 75 mcg  75 mcg Per Tube Q0600 de Sindy Messing, Erven Colla, MD   75 mcg at 02/04/22 0867   losartan (COZAAR) tablet 25 mg  25 mg Per Tube Daily Janine Ores, NP   25 mg at 02/04/22 6195   Oral care mouth rinse  15 mL Mouth Rinse 4 times per day Janine Ores, NP   15 mL at 02/04/22 0932   Oral care mouth rinse  15 mL Mouth Rinse PRN Janine Ores, NP       pantoprazole (PROTONIX) 2 mg/mL oral suspension 40 mg  40 mg Per Tube Daily Bell, Lorin C, RPH   40 mg at 02/04/22 0917   polyethylene glycol (MIRALAX / GLYCOLAX) packet 17 g  17 g Per Tube BID Janine Ores, NP   17 g at 02/04/22 0917   rosuvastatin (CRESTOR) tablet 20 mg  20 mg Per Tube Daily Janine Ores, NP   20 mg at 02/04/22 0917   senna-docusate (Senokot-S) tablet 1 tablet  1 tablet Per Tube BID Garvin Fila, MD   1 tablet at 02/04/22 0917   sodium chloride flush (NS) 0.9 % injection 10-40  mL  10-40 mL Intracatheter Q12H Garvin Fila, MD   10 mL at 02/04/22 0919   sodium chloride flush (NS) 0.9 % injection 10-40 mL  10-40 mL Intracatheter PRN Garvin Fila, MD       ticagrelor [facility]) tablet 90 mg  90 mg Per Tube BID Ascencion Dike, PA-C   90 mg at 02/04/22 0917   topiramate (TOPAMAX) tablet 50 mg  50 mg Per Tube BID Janine Ores, NP         Discharge Medications: Please see discharge summary for a list of discharge medications.  Relevant Imaging Results:  Relevant Lab Results:   Additional Information SSN: [place] Rozel, LCSW   I have  personally obtained history,examined this patient, reviewed notes, independently viewed imaging studies, participated in medical decision making and plan of care.ROS completed by me personally and pertinent positives fully documented  I have made any additions or clarifications directly to the above note. Agree with note above.    Antony Contras, MD Medical Director [facility] Pager: [phone] 02/04/2022 6:25 PM

## 2022-02-05 DIAGNOSIS — I639 Cerebral infarction, unspecified: Secondary | ICD-10-CM | POA: Diagnosis not present

## 2022-02-05 DIAGNOSIS — R001 Bradycardia, unspecified: Secondary | ICD-10-CM | POA: Diagnosis not present

## 2022-02-05 LAB — GLUCOSE, CAPILLARY
Glucose-Capillary: 134 mg/dL — ABNORMAL HIGH (ref 70–99)
Glucose-Capillary: 136 mg/dL — ABNORMAL HIGH (ref 70–99)
Glucose-Capillary: 147 mg/dL — ABNORMAL HIGH (ref 70–99)
Glucose-Capillary: 160 mg/dL — ABNORMAL HIGH (ref 70–99)
Glucose-Capillary: 189 mg/dL — ABNORMAL HIGH (ref 70–99)

## 2022-02-05 NOTE — Plan of Care (Signed)
  Problem: Activity: Goal: Ability to return to baseline activity level will improve Outcome: Progressing   Problem: Coping: Goal: Will identify appropriate support needs Outcome: Progressing   Problem: Health Behavior/Discharge Planning: Goal: Ability to manage health-related needs will improve Outcome: Progressing

## 2022-02-05 NOTE — Progress Notes (Signed)
Report called to Collie Siad, RN on [facility]. Transferring to [facility]05.

## 2022-02-05 NOTE — Progress Notes (Signed)
Rounding Note    Patient Name: Julia Deleon Date of Encounter: 02/05/2022  [facility] Cardiologist: None   Subjective   Somnolent but arousable.    Inpatient Medications    Scheduled Meds:  amantadine  100 mg Per Tube BID   aspirin  81 mg Per Tube Daily   Chlorhexidine Gluconate Cloth  6 each Topical Q0600   heparin injection (subcutaneous)  5,000 Units Subcutaneous Q8H   levothyroxine  75 mcg Per Tube Q0600   losartan  25 mg Per Tube Daily   mouth rinse  15 mL Mouth Rinse 4 times per day   pantoprazole  40 mg Per Tube Daily   polyethylene glycol  17 g Per Tube BID   rosuvastatin  20 mg Per Tube Daily   senna-docusate  1 tablet Per Tube BID   sodium chloride flush  10-40 mL Intracatheter Q12H   ticagrelor  90 mg Per Tube BID   topiramate  50 mg Per Tube BID   Continuous Infusions:  sodium chloride Stopped (01/30/22 1037)   cefTRIAXone (ROCEPHIN)  IV 1 g (02/04/22 1821)   clevidipine     feeding supplement (OSMOLITE 1.5 CAL) 1,000 mL (02/05/22 0731)   PRN Meds: acetaminophen **OR** acetaminophen (TYLENOL) oral liquid 160 mg/5 mL **OR** acetaminophen, acetaminophen-codeine, albuterol, hydrALAZINE, mouth rinse, sodium chloride flush   Vital Signs    Vitals:   02/05/22 0230 02/05/22 0500 02/05/22 0745 02/05/22 1200  BP: (!) 153/55  (!) 148/82 (!) 146/55  Pulse: 68  71 73  Resp: '18  14 16  '$ Temp: 99.6 F (37.6 C)  98.1 F (36.7 C) 97.8 F (36.6 C)  TempSrc: Oral  Oral Oral  SpO2: 95%  95% 97%  Weight:  73.4 kg    Height:        Intake/Output Summary (Last 24 hours) at 02/05/2022 1311 Last data filed at 02/05/2022 0650 Gross per 24 hour  Intake 815 ml  Output 4500 ml  Net -3685 ml       02/05/2022    5:00 AM 02/04/2022    4:51 AM 01/29/2022   11:00 AM  Last 3 Weights  Weight (lbs) 161 lb 13.1 oz 167 lb 15.9 oz 167 lb 15.9 oz  Weight (kg) 73.4 kg 76.2 kg 76.2 kg      Telemetry    Not on telemetry- Personally Reviewed  ECG    No new-  Personally Reviewed  Physical Exam   GEN: Somnolent but arousable Neck: No JVD Cardiac: RRR, no murmurs Respiratory: Clear to auscultation bilaterally. GI: Soft, nontender MS: No edema; No deformity. Neuro: Left-sided hemiparesis  Labs    High Sensitivity Troponin:   Recent Labs  Lab 01/30/22 1000 01/30/22 1238  TROPONINIHS 31* 67*      Chemistry Recent Labs  Lab 01/29/22 2338 01/30/22 1000 01/30/22 1105 01/31/22 0417 02/02/22 1814 02/03/22 0639 02/03/22 1925 02/04/22 0443  NA 141 143  --  142  --   --   --   --   K 3.9 3.7  --  3.8  --   --   --   --   CL 116* 119*  --  120*  --   --   --   --   CO2 18* 17*  --  17*  --   --   --   --   GLUCOSE 135* 104*  --  105*  --   --   --   --   BUN  18 19  --  18  --   --   --   --   CREATININE 1.02* 1.22*  --  1.13*  --   --   --   --   CALCIUM 7.6* 7.5*  --  7.8*  --   --   --   --   MG  --  2.1   < > 2.2   < > 2.2 2.2 2.4  PROT 5.0*  --   --   --   --   --   --   --   ALBUMIN 2.9*  --   --   --   --   --   --   --   AST 15  --   --   --   --   --   --   --   ALT 16  --   --   --   --   --   --   --   ALKPHOS 39  --   --   --   --   --   --   --   BILITOT 0.7  --   --   --   --   --   --   --   GFRNONAA 58* 47*  --  51*  --   --   --   --   ANIONGAP 7 7  --  5  --   --   --   --    < > = values in this interval not displayed.     Lipids  Recent Labs  Lab 01/30/22 0639  CHOL 169  TRIG 96  HDL 25*  LDLCALC 125*  CHOLHDL 6.8     Hematology Recent Labs  Lab 01/29/22 1915 01/30/22 0933 01/31/22 0417  WBC  --  11.9* 9.9  RBC  --  3.21* 3.29*  HGB 10.5* 10.2* 10.3*  HCT 31.0* 31.0* 30.7*  MCV  --  96.6 93.3  MCH  --  31.8 31.3  MCHC  --  32.9 33.6  RDW  --  13.3 13.7  PLT  --  186 171    Thyroid  Recent Labs  Lab 01/30/22 1000  TSH 1.434     BNPNo results for input(s): "BNP", "PROBNP" in the last 168 hours.  DDimer No results for input(s): "DDIMER" in the last 168 hours.   Radiology    No  results found.  Cardiac Studies     Patient Profile     76 y.o. female with a hx of COPD, CKD, hypertension, hypothyroidism who presented with acute CVA and is being seen for the evaluation of bradycardia   Assessment & Plan    Bradycardia: Intermittent sinus bradycardia down to 30s, associated with hypotension on 9/23.  Initially required dopamine but was weaned off. Rates have been stable.  Suspect bradycardia is due to carotid intervention and has improved with time.  Echocardiogram shows normal biventricular function, no significant valvular disease. No further cardiac work-up recommended at this time.     Acute CVA: Status post TNK and thrombectomy on 9/22.  Left-sided hemiparesis  [facility] will sign off.   Medication Recommendations:  None Other recommendations (labs, testing, etc):  None Follow up as an outpatient:  Follows with Dr Clayborn Bigness at [facility]   For questions or updates, please contact [place] Please consult [email or web address] for contact info under        Signed, Harrell Gave  Fritz Pickerel, MD  02/05/2022, 1:11 PM

## 2022-02-05 NOTE — Progress Notes (Addendum)
STROKE TEAM PROGRESS NOTE   INTERVAL HISTORY Patient is seen in her room with two family members at the bedside.  She has been able to eat some lunch with  their assistance.  She has been transferred out of the ICU, and her vital signs are stable.  Her neurological exam remains stable with noted eye opening apraxia.  Vitals:   02/05/22 0230 02/05/22 0500 02/05/22 0745 02/05/22 1200  BP: (!) 153/55  (!) 148/82 (!) 146/55  Pulse: 68  71 73  Resp: '18  14 16  '$ Temp: 99.6 F (37.6 C)  98.1 F (36.7 C) 97.8 F (36.6 C)  TempSrc: Oral  Oral Oral  SpO2: 95%  95% 97%  Weight:  73.4 kg    Height:       CBC:  Recent Labs  Lab 01/30/22 0933 01/31/22 0417  WBC 11.9* 9.9  HGB 10.2* 10.3*  HCT 31.0* 30.7*  MCV 96.6 93.3  PLT 186 409    Basic Metabolic Panel:  Recent Labs  Lab 01/30/22 1000 01/30/22 1105 01/31/22 0417 02/02/22 1814 02/03/22 1925 02/04/22 0443  NA 143  --  142  --   --   --   K 3.7  --  3.8  --   --   --   CL 119*  --  120*  --   --   --   CO2 17*  --  17*  --   --   --   GLUCOSE 104*  --  105*  --   --   --   BUN 19  --  18  --   --   --   CREATININE 1.22*  --  1.13*  --   --   --   CALCIUM 7.5*  --  7.8*  --   --   --   MG 2.1   < > 2.2   < > 2.2 2.4  PHOS  --   --  2.1*   < > 3.1 2.8   < > = values in this interval not displayed.    Lipid Panel:  Recent Labs  Lab 01/30/22 0639  CHOL 169  TRIG 96  HDL 25*  CHOLHDL 6.8  VLDL 19  LDLCALC 125*    HgbA1c:  No results for input(s): "HGBA1C" in the last 168 hours.  Urine Drug Screen:  Recent Labs  Lab 01/29/22 2215  LABOPIA NONE DETECTED  COCAINSCRNUR NONE DETECTED  LABBENZ NONE DETECTED  AMPHETMU NONE DETECTED  THCU NONE DETECTED  LABBARB NONE DETECTED     Alcohol Level  No results for input(s): "ETH" in the last 168 hours.   IMAGING past 24 hours No results found.  Physical Exam  Constitutional: Appears well-developed and well-nourished elderly Caucasian lady.   Cardiovascular:  Normal rate and regular rhythm.  Respiratory: Effort normal, non-labored breathing  Neuro: Mental Status: Patient is drowsy, but opens eyes to voice and follows commands Right gaze preference, comes to midline. Follows commands speech is hesitant with some dysarthria but at times gibberish difficult to understand Cranial Nerves: II: Right gaze preference, unable to look to the left III,IV, VI: EOMI without ptosis or diploplia..  Blinks to threat on the right and not on the left. VII: Right facial droop  VIII: Hearing is intact to voice X: voice is hypophonic  XII: Tongue protrudes midline without atrophy or fasciculations.  Motor: Tone is normal. Bulk is normal.  Right full strength LLE 2/5 LUE 0/5 Sensory: No sensory deficit  noted in any extremity     ASSESSMENT/PLAN Ms. Iran C Packman is a 76 y.o. female with history of arthritis, CKD, COPD, Fuch's corneal endothelial dystrophy, HLD, HTN and hypothyroidism who presents to the ED via EMS as a Code Stroke with acute onset of left sided weakness, left facial droop and dysarthria with neglect. Initially symptoms improved in IR suite after TNK and she was taken back to her ED room. She then had a change in neurological exam and was reactivated as a Code IR.  Stroke: Right MCA infarct s/p TNK and mechanical thrombectomy with TICI2c revascularization  Etiology:  atherosclerosis and stenosis of intracranial vessels   Code Stroke CT head Loss of gray-white differentiation in the posterior right insular cortex and right lentiform nucleus compatible with acute/subacute right MCA infarct. Aspects 8/10 CTA head & neck Acute occlusion of the proximal right M1. Severe stenosis at the origin of the right ICA (>75%). Repeat CT Head Sequelae of interval stenting of the right ICA and MCA. Scattered hyper density involving the right anterior and medial frontal region as well as the right sylvian fissure, similar to prior intra procedural CT. While this  finding almost certainly reflects a degree of contrast staining, superimposed subarachnoid hemorrhage at the right sylvian fissure not excluded. Underlying evolving right MCA territory infarct. Post IR CT contrast staining in the basal ganglia and right medial frontal cortex as well as trace hyperdensity in the right sylvian fissure. MRI Pre procedure- Acute/subacute nonhemorrhagic infarct involving the posterior right parietal cortex  MRA  Pre procedure 1. Moderate stenosis of the right cavernous ICA. 2. Mild narrowing of the left cavernous ICA. 3. High-grade right M1 segment stenosis with asymmetric attenuation of distal MCA branch vessels. 4. Mild narrowing of the proximal right A1 segment. 5. Mild right P2 segment stenosis. 6. The left vertebral artery is the dominant vessel. 2D Echo EF 55-60% LDL 125 HgbA1c 5.4 VTE prophylaxis - SCDs    Diet   DIET DYS 3 Room service appropriate? Yes with Assist; Fluid consistency: Honey Thick   No antithrombotic prior to admission, now on aspirin 81 mg daily and Brilinta (ticagrelor) 90 mg bid.  Therapy recommendations:  CIR Disposition:  Pending  Mechanical Thrombectomy with cervical carotid angioplasty and stent placement Mechanical thrombectomy performed with direct contact aspiration with recanalization of the M1 segment with residual atherosclerotic stenosis. Mechanical thrombectomy performed with stent retriever with recanalization of M2  (TICI 2C).  [facility] post procedure- IR following for stabilization so this can be coiled once she is stabilized  Hx of hypertension now hypotensive with bradycardia requiring vasopressors Home meds:  Losartan '50mg'$  Stable- resumed losartan at '25mg'$  BP goal less than 160  Hyperlipidemia Home meds:  None, LDL 125, goal < 70 Rosuvastatin 20 mg daily  Dysphagia Cortrak tube in place  SLP following  Will cycle tube feedings to allow for better PO intake  Other Stroke Risk Factors Advanced Age >/= 58    Other Active Problems CKD Stage 3 Cr and GFR stable  Intermittent bradycardia with hypotension Was on dopamine, no longer needing it now Cardiology consulted CCM consulted Hold antihypertensive medications PICC line placed for vasopressors  Headache Added tylenol with codeine PRN Topamax '25mg'$  BID -> increased to '50mg'$   One time dose of Ultram today COPD Not in acute exacerbation  Urinary retention Bladder scan with >916m One dose of lasix 9/28 due to wheezing In and out cath, may start retention medications   Hospital day # 7  Patient seen  and examined by NP/APP with MD. MD to update note as needed.   [place] , MSN, AGACNP-BC [facility] See Amion for schedule and pager information 02/05/2022 2:18 PM  I have personally obtained history,examined this patient, reviewed notes, independently viewed imaging studies, participated in medical decision making and plan of care.ROS completed by me personally and pertinent positives fully documented  I have made any additions or clarifications directly to the above note. Agree with note above.  Recommend encourage oral intake and stop tube feeds during the day and continue to fluids only at night (agreed.  Continue ongoing therapies.  Continue Topamax 50 mg twice daily for headache.  Consider skilled nursing facility when bed available in the next few days Long discussion with patient and husband at the bedside and answered questions.  Greater than 50% time during this 35-minute visit was spent in counseling and coordination of care and discussion patient and care team  Antony Contras, MD Medical Director [facility] Pager: [phone] 02/05/2022 3:29 PM   To contact Stroke Continuity provider, please refer to [email or web address] After hours, contact General Neurology

## 2022-02-05 NOTE — Care Management Important Message (Signed)
Important Message  Patient Details  Name: Julia Deleon MRN: [id number] Date of Birth: [birth year]   Medicare Important Message Given:  Yes     Orbie Pyo 02/05/2022, 3:33 PM

## 2022-02-06 DIAGNOSIS — E7800 Pure hypercholesterolemia, unspecified: Secondary | ICD-10-CM

## 2022-02-06 DIAGNOSIS — I6521 Occlusion and stenosis of right carotid artery: Secondary | ICD-10-CM | POA: Diagnosis not present

## 2022-02-06 DIAGNOSIS — N183 Chronic kidney disease, stage 3 unspecified: Secondary | ICD-10-CM | POA: Diagnosis not present

## 2022-02-06 DIAGNOSIS — I639 Cerebral infarction, unspecified: Secondary | ICD-10-CM | POA: Diagnosis not present

## 2022-02-06 DIAGNOSIS — N390 Urinary tract infection, site not specified: Secondary | ICD-10-CM

## 2022-02-06 LAB — CBC
HCT: 29.7 % — ABNORMAL LOW (ref 36.0–46.0)
Hemoglobin: 10 g/dL — ABNORMAL LOW (ref 12.0–15.0)
MCH: 32.2 pg (ref 26.0–34.0)
MCHC: 33.7 g/dL (ref 30.0–36.0)
MCV: 95.5 fL (ref 80.0–100.0)
Platelets: 191 10*3/uL (ref 150–400)
RBC: 3.11 MIL/uL — ABNORMAL LOW (ref 3.87–5.11)
RDW: 13.7 % (ref 11.5–15.5)
WBC: 10.3 10*3/uL (ref 4.0–10.5)
nRBC: 0.2 % (ref 0.0–0.2)

## 2022-02-06 LAB — BASIC METABOLIC PANEL
Anion gap: 6 (ref 5–15)
BUN: 19 mg/dL (ref 8–23)
CO2: 21 mmol/L — ABNORMAL LOW (ref 22–32)
Calcium: 8.3 mg/dL — ABNORMAL LOW (ref 8.9–10.3)
Chloride: 112 mmol/L — ABNORMAL HIGH (ref 98–111)
Creatinine, Ser: 0.92 mg/dL (ref 0.44–1.00)
GFR, Estimated: >60 mL/min (ref >60)
Glucose, Bld: 162 mg/dL — ABNORMAL HIGH (ref 70–99)
Potassium: 4.1 mmol/L (ref 3.5–5.1)
Sodium: 139 mmol/L (ref 135–145)

## 2022-02-06 LAB — GLUCOSE, CAPILLARY
Glucose-Capillary: 149 mg/dL — ABNORMAL HIGH (ref 70–99)
Glucose-Capillary: 114 mg/dL — ABNORMAL HIGH (ref 70–99)
Glucose-Capillary: 134 mg/dL — ABNORMAL HIGH (ref 70–99)
Glucose-Capillary: 139 mg/dL — ABNORMAL HIGH (ref 70–99)

## 2022-02-06 LAB — URINE CULTURE: Culture: <10000 — AB

## 2022-02-06 MED ORDER — OSMOLITE 1.5 CAL PO LIQD
1000.0000 mL | ORAL | Status: DC
  Administered 2022-02-06 – 2022-02-08 (×3): 1000 mL
  Filled 2022-02-06: qty 1000

## 2022-02-06 MED ORDER — BUTALBITAL-APAP-CAFFEINE 50-325-40 MG PO TABS
1.0000 | ORAL_TABLET | Freq: Two times a day (BID) | ORAL | Status: DC | PRN
  Administered 2022-02-07 – 2022-02-16 (×11): 1 via ORAL
  Filled 2022-02-06 (×14): qty 1

## 2022-02-06 NOTE — Plan of Care (Signed)
  Problem: Activity: Goal: Ability to return to baseline activity level will improve Outcome: Progressing   Problem: Cardiovascular: Goal: Ability to achieve and maintain adequate cardiovascular perfusion will improve Outcome: Progressing   Problem: Education: Goal: Knowledge of disease or condition will improve Outcome: Progressing Goal: Knowledge of secondary prevention will improve (SELECT ALL) Outcome: Progressing   Problem: Education: Goal: Knowledge of General Education information will improve Description: Including pain rating scale, medication(s)/side effects and non-pharmacologic comfort measures Outcome: Progressing   Problem: Coping: Goal: Level of anxiety will decrease Outcome: Progressing

## 2022-02-06 NOTE — Progress Notes (Addendum)
STROKE TEAM PROGRESS NOTE   INTERVAL HISTORY Patient is seen in her room with her husband at the bedside.  She is keeping her eyes open more today and is able to talk more.  She has been hemodynamically stable and her neurological exam is stable.  Vitals:   02/06/22 0448 02/06/22 0449 02/06/22 0803 02/06/22 1157  BP: 131/60  130/61 (!) 170/60  Pulse: 62  63 71  Resp: '12  17 16  '$ Temp: 99.2 F (37.3 C)  98.4 F (36.9 C) 98.1 F (36.7 C)  TempSrc: Axillary  Oral Oral  SpO2: 97%  98% 98%  Weight:  75.6 kg    Height:       CBC:  Recent Labs  Lab 01/31/22 0417 02/06/22 0600  WBC 9.9 10.3  HGB 10.3* 10.0*  HCT 30.7* 29.7*  MCV 93.3 95.5  PLT 171 132    Basic Metabolic Panel:  Recent Labs  Lab 01/31/22 0417 02/02/22 1814 02/03/22 1925 02/04/22 0443 02/06/22 0600  NA 142  --   --   --  139  K 3.8  --   --   --  4.1  CL 120*  --   --   --  112*  CO2 17*  --   --   --  21*  GLUCOSE 105*  --   --   --  162*  BUN 18  --   --   --  19  CREATININE 1.13*  --   --   --  0.92  CALCIUM 7.8*  --   --   --  8.3*  MG 2.2   < > 2.2 2.4  --   PHOS 2.1*   < > 3.1 2.8  --    < > = values in this interval not displayed.    Lipid Panel:  No results for input(s): "CHOL", "TRIG", "HDL", "CHOLHDL", "VLDL", "LDLCALC" in the last 168 hours.  HgbA1c:  No results for input(s): "HGBA1C" in the last 168 hours.  Urine Drug Screen:  No results for input(s): "LABOPIA", "COCAINSCRNUR", "LABBENZ", "AMPHETMU", "THCU", "LABBARB" in the last 168 hours.   Alcohol Level  No results for input(s): "ETH" in the last 168 hours.   IMAGING past 24 hours No results found.  Physical Exam  Constitutional: Appears well-developed and well-nourished elderly Caucasian lady.   Cardiovascular: Normal rate and regular rhythm.  Respiratory: Effort normal, non-labored breathing  Neuro: Mental Status: Patient is alert with eyes open more today. Right gaze preference, crosses midline. Follows commands speech  is hesitant with some dysarthria but oriented to self and time, disoriented to place Cranial Nerves: II: Right gaze preference, able to cross midline III,IV, VI: EOMI without ptosis or diploplia..  Blinks to threat on the right and not on the left. VII: Right facial droop  VIII: Hearing is intact to voice X: voice is hypophonic  XII: Tongue protrudes midline without atrophy or fasciculations.  Motor: Tone is normal. Bulk is normal.  Right full strength LLE 2/5 LUE 0/5 Sensory: No sensory deficit noted in any extremity     ASSESSMENT/PLAN [name] is a 76 y.o. female with history of arthritis, CKD, COPD, Fuch's corneal endothelial dystrophy, HLD, HTN and hypothyroidism who presents to the ED via EMS as a Code Stroke with acute onset of left sided weakness, left facial droop and dysarthria with neglect. Initially symptoms improved in IR suite after TNK and she was taken back to her ED room. She then had a  change in neurological exam and was reactivated as a Code IR.  Stroke: Right MCA infarct s/p TNK and IR with TICI2c, likely due to large vessel disease from R ICA high grade stenosis   Code Stroke CT head Loss of gray-white differentiation in the posterior right insular cortex and right lentiform nucleus compatible with acute/subacute right MCA infarct. Aspects 8/10 CTA head & neck Acute occlusion of the proximal right M1. Severe stenosis at the origin of the right ICA (>75%). MRI Acute/subacute small infarct involving the posterior right parietal cortex  MRA moderate stenosis right cavernous ICA, high-grade right M1 segment stenosis IR showed right M1 and ACA occlusion status post EVT with TICI3 and R MCA and proximal ICA stents.  Complicated by CC fistula s/p flow diverters and stable CT head 9/26 multifocal right hemisphere infarcts including right frontal gyrus, right BG, superior perirolandic cortex.  Trace IVH and SAH.  Right ICA siphon and MCA vascular stents. 2D Echo EF  55-60% LDL 125 HgbA1c 5.4 VTE prophylaxis -heparin subcu No antithrombotic prior to admission, now on aspirin 81 mg daily and Brilinta (ticagrelor) 90 mg bid.  Therapy recommendations:  CIR Disposition:  Pending  [facility]  procedure related complication Status post overlapping flow diverters Stable so far  May consider coiling in the future  Carotid stenosis CTA head neck showed right ICA > 75% stenosis Likely the cause of current stroke Status post ICA stenting On aspirin and Brilinta  Hx of hypertension hypotensive with bradycardia  Home meds:  Losartan '50mg'$  Likely related to carotid stenting Stable now resumed losartan at '25mg'$  BP goal less than 160 Long-term BP goal normotensive  Hyperlipidemia Home meds:  None LDL 125, goal < 70 Rosuvastatin 20 mg daily Continue statin at discharge  Dysphagia Cortrak tube in place  SLP following  On dysphagia 3 and honey thick liquid Change to nocturnal tube feeding to improve p.o. intake during the day  Other Stroke Risk Factors Advanced Age >/= 72   Other Active Problems CKD Stage 3 Cr 1.13 and GFR stable  Headache Added tylenol with codeine PRN Topamax '25mg'$  BID -> increased to '50mg'$   One time dose of Ultram today COPD UTI and urinary retention Bladder scan with >916m In and out cath Tmax 100.4 Leukocytosis WBC 11.9-9.9-10.3 UA WBC more than 50, on Rocephin  Hospital day # 8  Patient seen and examined by NP/APP with MD. MD to update note as needed.   [place], MSN, AGACNP-BC [facility] See Amion for schedule and pager information 02/06/2022 12:07 PM  ATTENDING NOTE: I reviewed above note and agree with the assessment and plan. Pt was seen and examined.   76year old female with history of hypertension, hyperlipidemia, CKD, COPD admitted for left-sided weakness, left facial droop and right neglect.  CT head showed right insular hypoattenuation, aspect score 8.  Status post TNK.  CTA head  and neck showed right M1 occlusion, right ICA bifurcation more than 75% stenosis.  Distal basilar artery severe stenosis.  Symptoms improved after TNK.  MRI showed right small MCA infarct.  MRI showed right terminal ICA and M1 opened but with severe stenosis.  Subsequently, neurological worsening from NIHSS 0->15, IR showed right M1 and right ACA occlusion, status post EVT with TICI3 but needed right M1 stenting.  Complicated by CC fistula status post flow diverter.  Finally, patient received right ICA bifurcation stenting for high-grade stenosis.  Post IR, CT repeat right MCA patchy infarct.  On aspirin and Brilinta.  EF 60 to 65%, LDL 125, A1c 6.4, creatinine 1.13.  Tmax 100.4, UA WBC more than 50.  On exam, husband is at the bedside. Pt drowsy sleepy but easy to arouse, she has right gaze and not cross midline, orientated to age, place, and people. No aphasia but paucity of speech, following all simple commands. Able to name 2/3 and repeat simple sentences but lethargic on task. Tracking bilaterally on the right visual field, not blinking to visual threat on the left. Left facial droop. Tongue midline. LUE flaccid, LLE withdraw to pain 3-/5. RUE and RLE at least 3/5. Sensation, coordination not cooperative and gait not tested.  Etiology for patient stroke likely due to right ICA high-grade stenosis, large vessel source.  Status post ICA stenting and M1 stenting.  On aspirin and Brilinta, continue.  Also on Crestor 20.  Still has right hemiplegia, PT/OT recommend CIR versus SNF.  UA WBC more than 50, on Rocephin.  Continue Topamax for headache management.  Added Fioricet as needed.  On tube feeding, will start nocturnal tube feeding to facilitate p.o. intake.  For detailed assessment and plan, please refer to above/below as I have made changes wherever appropriate.   Rosalin Hawking, MD PhD Stroke Neurology 02/06/2022 1:40 PM     To contact Stroke Continuity provider, please refer to [email or web address] After  hours, contact General Neurology

## 2022-02-06 NOTE — PMR Pre-admission (Signed)
PMR Admission Coordinator Pre-Admission Assessment  Patient: Julia Deleon is an 76 y.o., female MRN: [id number] DOB: [birth year] Height: _0  (162.6 cm) Weight: 69.8 kg  Insurance Information HMO:     PPO:      PCP:      IPA:      80/20:      OTHER:  PRIMARY: Medicare A and B      Policy#: [id number]      Subscriber: patient CM Name:        Phone#:       Fax#:   Pre-Cert#:        Employer: Retired Benefits:  Phone #:       Name: Checked in passport one source [employer] Date: 10/08/2012     Deduct: $1600      Out of Pocket Max: none      Life Max: N/A CIR: 100%      SNF: 100 days Outpatient: 80%     Co-Pay: 20% Home Health: 100%      Co-Pay: none DME: 80%     Co-Pay: 20% Providers: patients choice  SECONDARY: Generic commercial      Policy#: [id number]     Phone#: [phone]  Financial Counselor:        Phone#:    The "Data Collection Information Summary" for patients in Inpatient Rehabilitation Facilities with attached "Privacy Act [place] Records" was provided and verbally reviewed with: Patient and Family  Emergency Contact Information Contact Information     Name Relation Home Work [place] R Spouse   [place] Daughter   [phone]       Current Medical History  Patient Admitting Diagnosis: R CVA  History of Present Illness: A 76 year old female with the above medical history who presented to ED as acute stroke code on 01/29/2022.  He had acute onset of left-sided weakness and left facial droop at 10:20 AM while she was working out.  In the ED, she was given TNK.  She was found to have right MCA occlusion as well as near occlusive stenosis of right internal carotid artery.  Underwent thrombectomy of M1/M2 and subsequent stent angioplasty, as well as right ICA stent angioplasty.  While is the ICU had issues with bradycardia and hypotension. PT/OT/SLP evaluations were completed with recommendations for acute inpatient rehab  admission.  Complete NIHSS TOTAL: 10  Patient's medical record from [place] has been reviewed by the rehabilitation admission coordinator and physician.  Past Medical History  Past Medical History:  Diagnosis Date   Arthritis    Chronic kidney disease    COPD (chronic obstructive pulmonary disease) ([place])    pt denies    Dyspnea    Family history of adverse reaction to anesthesia    father was slow to wake up   Fuch's endothelial dystrophy    GERD (gastroesophageal reflux disease)    Hyperlipidemia    Hypertension    Hypothyroidism    Pneumonia    hx of    PONV (postoperative nausea and vomiting)    slow to wake up and PONV    Has the patient had major surgery during 100 days prior to admission? Yes  Family History   family history includes Brain cancer in her sister; Breast cancer in her sister and another family member; CAD in her mother; Cancer in her mother and paternal grandmother; Emphysema in her paternal grandfather; Fibromyalgia in her sister; Heart disease in her maternal grandmother; Stroke in her  father; Varicose Veins in her son.  Current Medications  Current Facility-Administered Medications:    0.9 %  sodium chloride infusion, 250 mL, Intravenous, Continuous, Rosalin Hawking, MD, Last Rate: 50 mL/hr at 02/15/22 1500, Infusion Verify at 02/15/22 1500   acetaminophen (TYLENOL) tablet 650 mg, 650 mg, Oral, Q6H, 650 mg at 02/16/22 0536 **OR** acetaminophen (TYLENOL) 160 MG/5ML solution 650 mg, 650 mg, Per Tube, Q6H **OR** acetaminophen (TYLENOL) suppository 650 mg, 650 mg, Rectal, Q6H, Rosalin Hawking, MD   albuterol (PROVENTIL) (2.5 MG/3ML) 0.083% nebulizer solution 2.5 mg, 2.5 mg, Nebulization, Q6H PRN, Charlean Merl, Devon, NP   amantadine (SYMMETREL) capsule 200 mg, 200 mg, Oral, BID, Jardin, Carla G, RPH, 200 mg at 02/15/22 2130   aspirin chewable tablet 81 mg, 81 mg, Oral, Daily, Rosalin Hawking, MD, 81 mg at 02/15/22 0943   butalbital-acetaminophen-caffeine (FIORICET)  50-325-40 MG per tablet 1 tablet, 1 tablet, Oral, Q12H PRN, Rosalin Hawking, MD, 1 tablet at 02/16/22 0606   Chlorhexidine Gluconate Cloth 2 % PADS 6 each, 6 each, Topical, Q0600, Kerney Elbe, MD, 6 each at 02/16/22 0536   feeding supplement (NEPRO CARB STEADY) liquid 237 mL, 237 mL, Oral, BID BM, Rosalin Hawking, MD, 237 mL at 02/15/22 1359   food thickener (SIMPLYTHICK (NECTAR/LEVEL 2/MILDLY THICK)) 1 packet, 1 packet, Oral, PRN, Rosalin Hawking, MD   heparin injection 5,000 Units, 5,000 Units, Subcutaneous, Q8H, Shafer, Devon, NP, 5,000 Units at 02/16/22 0535   hydrALAZINE (APRESOLINE) injection 10 mg, 10 mg, Intravenous, Q4H PRN, Garvin Fila, MD   [START ON 02/17/2022] influenza vaccine adjuvanted (FLUAD) injection 0.5 mL, 0.5 mL, Intramuscular, Tomorrow-1000, Sethi, Pramod S, MD   levothyroxine (SYNTHROID) tablet 75 mcg, 75 mcg, Oral, Q0600, Rosalin Hawking, MD, 75 mcg at 02/16/22 0536   losartan (COZAAR) tablet 25 mg, 25 mg, Oral, Daily, Rosalin Hawking, MD, 25 mg at 02/15/22 4098   Oral care mouth rinse, 15 mL, Mouth Rinse, 4 times per day, Janine Ores, NP, 15 mL at 02/15/22 2131   Oral care mouth rinse, 15 mL, Mouth Rinse, PRN, Charlean Merl, Devon, NP   pantoprazole (PROTONIX) EC tablet 40 mg, 40 mg, Oral, Daily, Rosalin Hawking, MD, 40 mg at 02/15/22 0942   polyethylene glycol (MIRALAX / GLYCOLAX) packet 17 g, 17 g, Oral, BID, Rosalin Hawking, MD, 17 g at 02/15/22 0944   rosuvastatin (CRESTOR) tablet 20 mg, 20 mg, Oral, Daily, Rosalin Hawking, MD, 20 mg at 02/15/22 0944   senna-docusate (Senokot-S) tablet 1 tablet, 1 tablet, Oral, BID, Rosalin Hawking, MD, 1 tablet at 02/15/22 0943   sodium chloride flush (NS) 0.9 % injection 10-40 mL, 10-40 mL, Intracatheter, Q12H, Leonie Man, Pramod S, MD, 10 mL at 02/15/22 2131   sodium chloride flush (NS) 0.9 % injection 10-40 mL, 10-40 mL, Intracatheter, PRN, Garvin Fila, MD   tamsulosin [facility]) capsule 0.4 mg, 0.4 mg, Oral, Daily, Beulah Gandy A, NP, 0.4 mg at 02/15/22 1710    ticagrelor (BRILINTA) tablet 90 mg, 90 mg, Oral, BID, Rosalin Hawking, MD, 90 mg at 02/15/22 2130   topiramate (TOPAMAX) tablet 100 mg, 100 mg, Oral, BID, Rosalin Hawking, MD, 100 mg at 02/15/22 2130  Patients Current Diet:  Diet Order             DIET DYS 3 Room service appropriate? Yes with Assist; Fluid consistency: Nectar Thick  Diet effective now                   Precautions / Restrictions Precautions Precautions: Fall Precaution Comments: L  DIET DYS 3 Room service appropriate? Yes with Assist; Fluid consistency: Nectar Thick  Diet effective now                         Precautions / Restrictions Precautions Precautions: Fall Precaution Comments: L hemiparesis, L inattention Restrictions Weight Bearing Restrictions: No    Has the patient had 2 or more falls or a fall with injury in the past year? No   Prior Activity Level Community (5-7x/wk): Went out daily.  Was driving.   Prior Functional Level Self Care: Did the patient need help bathing, dressing, using the toilet or eating? Independent   Indoor Mobility: Did the patient need assistance with walking from room to room (with or without device)? Independent   Stairs: Did the patient need assistance with internal or external stairs (with or without device)? Independent   Functional Cognition: Did the patient need help planning regular tasks such as shopping or remembering to take medications? Independent   Patient Information Are you of Hispanic, Latino/a,or Spanish origin?: A. No, not of Hispanic, Latino/a, or Spanish origin What is your race?: A. White Do you need or want an interpreter to communicate with a doctor or health care staff?: 0. No   Patient's Response To:  Health Literacy and Transportation Is the patient able to respond to health literacy and transportation needs?: Yes Health Literacy - How often do you need to have someone help you when you read instructions, pamphlets, or other written material from your doctor or pharmacy?: Never In the past 12 months, has lack of transportation kept you from medical appointments or from getting medications?: No In the past  12 months, has lack of transportation kept you from meetings, work, or from getting things needed for daily living?: No   Home Assistive Devices / Equipment Home Assistive Devices/Equipment: None Home Equipment: Rolling Walker (2 wheels), Cane - single point, BSC/3in1, Shower seat   Prior Device Use: Indicate devices/aids used by the patient prior to current illness, exacerbation or injury? None of the above   Current Functional Level Cognition   Arousal/Alertness: Lethargic Overall Cognitive Status: Impaired/Different from baseline Difficult to assess due to: Level of arousal Current Attention Level: Focused Orientation Level: Oriented X4 Following Commands: Follows one step commands with increased time Safety/Judgement: Decreased awareness of safety, Decreased awareness of deficits General Comments: can draw pt's attension left with mod to maximal cuing, pt with depressed    Extremity Assessment (includes Sensation/Coordination)   Upper Extremity Assessment: RUE deficits/detail, LUE deficits/detail RUE Deficits / Details: generalized weakness LUE Deficits / Details: imparied sensory motor/impaired tone; moving LUE however not functionally/controlled or on command; complaining of pain L shoulder LUE Sensation: decreased light touch, decreased proprioception LUE Coordination: decreased fine motor, decreased gross motor  Lower Extremity Assessment: Defer to PT evaluation LLE Deficits / Details: Increased tone present, no active movement appreciated to command. LLE Sensation: WNL     ADLs   Overall ADL's : Needs assistance/impaired Eating/Feeding: Supervision/ safety, Sitting Eating/Feeding Details (indicate cue type and reason): completing meal upon entry Grooming: Wash/dry face, Brushing hair, Minimal assistance, Sitting Grooming Details (indicate cue type and reason): assistance to comb left side Upper Body Bathing: Maximal assistance Lower Body Bathing: Maximal assistance, Bed  level Upper Body Dressing : Moderate assistance, Sitting Upper Body Dressing Details (indicate cue type and reason): education on hemi technique Lower Body Dressing: Maximal assistance, Sitting/lateral leans Lower Body Dressing Details (indicate cue type and reason): able to   seated in recliner    Mobility  Overal bed mobility: Needs Assistance Bed Mobility: Rolling, Sidelying to Sit Rolling: Max assist Sidelying to sit: Max assist Supine to sit: Max assist Sit to supine: Max assist General bed mobility comments: rolling to the R requires max assist due to inability to use L UE and LE functionally to assist, maxAx2 for trunk elevation    Transfers  Overall transfer level: Needs assistance Equipment used: 2 person hand held assist (face to face transfer with use of bed pad) Transfers: Sit to/from Stand, Bed to chair/wheelchair/BSC Sit to Stand: Max assist, +2 physical assistance (x2 trials) Bed to/from chair/wheelchair/BSC transfer type:: Squat pivot Stand pivot transfers: Max assist, +2 physical assistance Step pivot transfers: Max assist, +2 physical assistance General transfer comment: max directional verbal cues, L knee blocked, pt with urinary incontinence upon first stand. pt cleaned up and socks changed prior to second stand and std pvt to chair, pt unable to advance R LE despite max verbal and tactile cues    Ambulation / Gait / Stairs / Wheelchair Mobility  Ambulation/Gait General Gait Details: unable    Posture / Balance Dynamic Sitting Balance Sitting balance - Comments: work at  [employer] for 6-7 min on balance, inhibiting R push, pt able to hold onto bed rail with R UE and maintain midline posture however despite max verbal/directional cues pt unable to maintain midline without R UE support or external support if not using R UE Balance Overall balance assessment: Needs assistance Sitting-balance support: Feet supported, Single extremity supported Sitting balance-Leahy Scale: Poor Sitting balance - Comments: work at [employer] for 6-7 min on balance, inhibiting R push, pt able to hold onto bed rail with R UE and maintain midline posture however despite max verbal/directional cues pt unable to maintain midline without R UE support or external support if not using R UE Postural control: Posterior lean, Left lateral lean Standing balance support: Bilateral upper extremity supported Standing balance-Leahy Scale: Zero Standing balance comment: dependent on external assist    Special needs/care consideration Skin Right thigh incision with dressing and Special service needs Currently has coretrak for tube feedings.   Previous Home Environment (from acute therapy documentation) Living Arrangements: Spouse/significant other Available Help at Discharge: Family, Available 24 hours/day Type of Home: House Home Layout: One level Home Access: Stairs to enter Entrance Stairs-Rails: Right Entrance Stairs-Number of Steps: 8 Bathroom Shower/Tub: Chiropodist: [place]: No  Discharge Living Setting Plans for Discharge Living Setting: Patient's home, House, Lives with (comment) (Lives with husband.) Type of Home at Discharge: House Discharge Home Layout: One level Discharge Home Access: Stairs to enter Entrance Stairs-Rails: None Entrance Stairs-Number of Steps: 1 step at garage entry Discharge Bathroom Shower/Tub: Tub/shower unit, Curtain Discharge Bathroom Toilet: Standard Discharge Bathroom Accessibility: Yes How Accessible: Accessible via  walker Does the patient have any problems obtaining your medications?: No  Social/Family/Support Systems Patient Roles: Spouse, Parent (Has a husband, daughter and a sister.) Contact Information: [place] - spouse Anticipated Caregiver: husband Anticipated Caregiver's Contact Information: Aylana Hirschfeld - husband - [phone] Ability/Limitations of Caregiver: Husband works as a Pharmacist, hospital on Tuesday and Thursday from 11 am to 6 pm.  He plans to talk with daughter and sister to find assistance. Caregiver Availability: Other (Comment) (Husband will try to establish 24/7 supervision) Discharge Plan Discussed with Primary Caregiver: Yes Is Caregiver In Agreement with Plan?: Yes Does Caregiver/Family have Issues with Lodging/Transportation while Pt is in Rehab?: No  Goals Patient/Family  Goal for Rehab: PT/OT/SLP supervision goals Expected length of stay: 12-14 days Pt/Family Agrees to Admission and willing to participate: Yes Program Orientation Provided & Reviewed with Pt/Caregiver Including Roles  & Responsibilities: Yes  Decrease burden of Care through IP rehab admission: Diet advancement, Decrease number of caregivers, Bowel and bladder program, and Patient/family education  Possible need for SNF placement upon discharge: Yes, may need SNF placement if she cannot make sufficient progress prior to discharge date  Patient Condition: I have reviewed medical records from [facility], spoken with CM, and patient and spouse. I met with patient at the bedside for inpatient rehabilitation assessment.  Patient will benefit from ongoing PT, OT, and SLP, can actively participate in 3 hours of therapy a day 5 days of the week, and can make measurable gains during the admission.  Patient will also benefit from the coordinated team approach during an Inpatient Acute Rehabilitation admission.  The patient will receive intensive therapy as well as Rehabilitation physician, nursing, social worker, and care  management interventions.  Due to bladder management, bowel management, safety, skin/wound care, disease management, medication administration, pain management, and patient education the patient requires 24 hour a day rehabilitation nursing.  The patient is currently Max+2  with mobility and basic ADLs.  Discharge setting and therapy post discharge at home with home health is anticipated.  Patient has agreed to participate in the Acute Inpatient Rehabilitation Program and will admit today.  Preadmission Screen Completed By:  Genella Mech, 02/16/2022 9:47 AM with updates by Clemens Catholic, MS, CCC-SLP ______________________________________________________________________   Discussed status with Dr. Curlene Dolphin on 02/16/22 at 34 and received approval for admission today.  Admission Coordinator:  Genella Mech, CCC-SLP, time 930/Date 02/16/22    Assessment/Plan: Diagnosis:Right MCA infarct  Does the need for close, 24 hr/day Medical supervision in concert with the patient's rehab needs make it unreasonable for this patient to be served in a less intensive setting? Yes Co-Morbidities requiring supervision/potential complications: HTN, HLD,CKD,, COPD  Due to bladder management, bowel management, safety, skin/wound care, disease management, medication administration, pain management, and patient education, does the patient require 24 hr/day rehab nursing? Yes Does the patient require coordinated care of a physician, rehab nurse, PT, OT, and SLP to address physical and functional deficits in the context of the above medical diagnosis(es)? Yes Addressing deficits in the following areas: balance, endurance, locomotion, strength, transferring, bowel/bladder control, bathing, dressing, feeding, grooming, toileting, cognition, speech, language, swallowing, and psychosocial support Can the patient actively participate in an intensive therapy program of at least 3 hrs of therapy 5 days a week? Yes The potential  for patient to make measurable gains while on inpatient rehab is excellent Anticipated functional outcomes upon discharge from inpatient rehab: supervision PT, supervision OT, supervision SLP Estimated rehab length of stay to reach the above functional goals is: 12-14 Anticipated discharge destination: Home 10. Overall Rehab/Functional Prognosis: excellent   MD Signature: Jennye Boroughs

## 2022-02-06 NOTE — Progress Notes (Signed)
IP rehab admissions - I met with patient and her husband, Julia Deleon, at the bedside.  I explained rehab options and I gave husband rehab booklet materials to review.  Husband would like CIR and does not want SNF placement.  But husband works T and Th from11 am to 6 pm as a Pharmacist, hospital until December 8th.  Husband is to talk with patient's sister and dtr to work out a plan for 24/7 supervision at home after a potential rehab stay.  Patient can answer all questions accurately this morning, but she could not tell me where she was.  Reoriented patient to the hospital environment.  Patient is requiring mod to max assist with mobility.  I will have my partners follow up on Monday for progress and plans.  Call me for questions.  [phone]

## 2022-02-07 DIAGNOSIS — I639 Cerebral infarction, unspecified: Secondary | ICD-10-CM | POA: Diagnosis not present

## 2022-02-07 DIAGNOSIS — N183 Chronic kidney disease, stage 3 unspecified: Secondary | ICD-10-CM | POA: Diagnosis not present

## 2022-02-07 LAB — BASIC METABOLIC PANEL
Anion gap: 6 (ref 5–15)
BUN: 23 mg/dL (ref 8–23)
CO2: 22 mmol/L (ref 22–32)
Calcium: 8.6 mg/dL — ABNORMAL LOW (ref 8.9–10.3)
Chloride: 108 mmol/L (ref 98–111)
Creatinine, Ser: 1.04 mg/dL — ABNORMAL HIGH (ref 0.44–1.00)
GFR, Estimated: 56 mL/min — ABNORMAL LOW (ref >60)
Glucose, Bld: 126 mg/dL — ABNORMAL HIGH (ref 70–99)
Potassium: 4.1 mmol/L (ref 3.5–5.1)
Sodium: 136 mmol/L (ref 135–145)

## 2022-02-07 LAB — CBC
HCT: 31.8 % — ABNORMAL LOW (ref 36.0–46.0)
Hemoglobin: 10.5 g/dL — ABNORMAL LOW (ref 12.0–15.0)
MCH: 31.4 pg (ref 26.0–34.0)
MCHC: 33 g/dL (ref 30.0–36.0)
MCV: 95.2 fL (ref 80.0–100.0)
Platelets: 229 10*3/uL (ref 150–400)
RBC: 3.34 MIL/uL — ABNORMAL LOW (ref 3.87–5.11)
RDW: 13.5 % (ref 11.5–15.5)
WBC: 13.4 10*3/uL — ABNORMAL HIGH (ref 4.0–10.5)
nRBC: 0.1 % (ref 0.0–0.2)

## 2022-02-07 LAB — GLUCOSE, CAPILLARY: Glucose-Capillary: 136 mg/dL — ABNORMAL HIGH (ref 70–99)

## 2022-02-07 NOTE — Progress Notes (Addendum)
STROKE TEAM PROGRESS NOTE   INTERVAL HISTORY Patient is seen in her room with her husband at the bedside.  She is able to keep her eyes open today and has been moving her left leg.  She continues to complain of a headache.  Vitals:   02/07/22 0000 02/07/22 0347 02/07/22 0457 02/07/22 0731  BP: (!) 144/68 (!) 150/65  (!) 147/50  Pulse: 94 79  73  Resp: '16 16  16  '$ Temp: 98.4 F (36.9 C) 98.6 F (37 C)  98.7 F (37.1 C)  TempSrc: Oral Oral    SpO2: 97% 96%  99%  Weight:   75.1 kg   Height:       CBC:  Recent Labs  Lab 02/06/22 0600 02/07/22 0600  WBC 10.3 13.4*  HGB 10.0* 10.5*  HCT 29.7* 31.8*  MCV 95.5 95.2  PLT 191 948    Basic Metabolic Panel:  Recent Labs  Lab 02/03/22 1925 02/04/22 0443 02/06/22 0600 02/07/22 0600  NA  --   --  139 136  K  --   --  4.1 4.1  CL  --   --  112* 108  CO2  --   --  21* 22  GLUCOSE  --   --  162* 126*  BUN  --   --  19 23  CREATININE  --   --  0.92 1.04*  CALCIUM  --   --  8.3* 8.6*  MG 2.2 2.4  --   --   PHOS 3.1 2.8  --   --     Lipid Panel:  No results for input(s): "CHOL", "TRIG", "HDL", "CHOLHDL", "VLDL", "LDLCALC" in the last 168 hours.  HgbA1c:  No results for input(s): "HGBA1C" in the last 168 hours.  Urine Drug Screen:  No results for input(s): "LABOPIA", "COCAINSCRNUR", "LABBENZ", "AMPHETMU", "THCU", "LABBARB" in the last 168 hours.   Alcohol Level  No results for input(s): "ETH" in the last 168 hours.   IMAGING past 24 hours No results found.  Physical Exam  Constitutional: Appears well-developed and well-nourished elderly Caucasian lady.   Cardiovascular: Normal rate and regular rhythm.  Respiratory: Effort normal, non-labored breathing  Neuro: Mental Status: Patient is alert with eyes open more today. Right gaze preference, crosses midline. Follows commands speech is hesitant with some dysarthria but oriented x3 Cranial Nerves: II: Right gaze preference, able to cross midline III,IV, VI: EOMI  without ptosis or diploplia..  Blinks to threat on the right and not on the left. VII: Right facial droop  VIII: Hearing is intact to voice X: voice is hypophonic  XII: Tongue protrudes midline without atrophy or fasciculations.  Motor: Tone is normal. Bulk is normal.  Right full strength LLE 3/5 LUE 0/5 Sensory: No sensory deficit noted in any extremity     ASSESSMENT/PLAN Ms. Natina C Luber is a 76 y.o. female with history of arthritis, CKD, COPD, Fuch's corneal endothelial dystrophy, HLD, HTN and hypothyroidism who presents to the ED via EMS as a Code Stroke with acute onset of left sided weakness, left facial droop and dysarthria with neglect. Initially symptoms improved in IR suite after TNK and she was taken back to her ED room. She then had a change in neurological exam and was reactivated as a Code IR.  Stroke: Right MCA infarct s/p TNK and IR with TICI2c, likely due to large vessel disease from R ICA high grade stenosis   Code Stroke CT head Loss of gray-white differentiation in the posterior  right insular cortex and right lentiform nucleus compatible with acute/subacute right MCA infarct. Aspects 8/10 CTA head & neck Acute occlusion of the proximal right M1. Severe stenosis at the origin of the right ICA (>75%). MRI Acute/subacute small infarct involving the posterior right parietal cortex  MRA moderate stenosis right cavernous ICA, high-grade right M1 segment stenosis IR showed right M1 and ACA occlusion status post EVT with TICI3 and R MCA and proximal ICA stents.  Complicated by CC fistula s/p flow diverters and stable CT head 9/26 multifocal right hemisphere infarcts including right frontal gyrus, right BG, superior perirolandic cortex.  Trace IVH and SAH.  Right ICA siphon and MCA vascular stents. 2D Echo EF 55-60% LDL 125 HgbA1c 5.4 VTE prophylaxis -heparin subcu No antithrombotic prior to admission, now on aspirin 81 mg daily and Brilinta (ticagrelor) 90 mg bid.  Therapy  recommendations:  CIR Disposition:  Pending  [facility]  procedure related complication Status post overlapping flow diverters Stable so far  May consider coiling in the future  Carotid stenosis CTA head neck showed right ICA > 75% stenosis Likely the cause of current stroke Status post ICA stenting On aspirin and Brilinta  Hx of hypertension hypotensive with bradycardia  Home meds:  Losartan '50mg'$  Likely related to carotid stenting Stable now resumed losartan at '25mg'$  BP goal less than 160 Long-term BP goal normotensive  Hyperlipidemia Home meds:  None LDL 125, goal < 70 Rosuvastatin 20 mg daily Continue statin at discharge  Dysphagia Cortrak tube in place  SLP following  On dysphagia 3 and honey thick liquid Change to nocturnal tube feeding to improve p.o. intake during the day  Other Stroke Risk Factors Advanced Age >/= 32   Other Active Problems CKD Stage 3 Cr 1.13 and GFR stable  Headache Added tylenol with codeine PRN Topamax '25mg'$  BID -> increased to '50mg'$   One time dose of Ultram today COPD UTI and urinary retention Bladder scan with >983m In and out cath Tmax 100.4->afebrile Leukocytosis WBC 11.9-9.9-10.3-> 13.4 UA WBC more than 50, on Rocephin  Hospital day # 9  Patient seen and examined by NP/APP with MD. MD to update note as needed.   [place], MSN, AGACNP-BC [facility] See Amion for schedule and pager information 02/07/2022 12:32 PM  ATTENDING NOTE: I reviewed above note and agree with the assessment and plan. Pt was seen and examined.   Husband and friends are at bedside.  Patient drowsy sleepy, however easily arousable and follows simple commands.  Still has left hemiplegia.  BP stable, leukocytosis continued.  On Rocephin.  On nocturnal tube feeding, ate some breakfast and lunch, encourage more p.o. intake in order to take off core track tube.  Continue aspirin Brilinta and statin.  Pending CIR.  For detailed  assessment and plan, please refer to above/below as I have made changes wherever appropriate.   JRosalin Hawking MD PhD Stroke Neurology 02/07/2022 11:41 PM    To contact Stroke Continuity provider, please refer to [email or web address] After hours, contact General Neurology

## 2022-02-08 ENCOUNTER — Inpatient Hospital Stay (HOSPITAL_COMMUNITY): Payer: Medicare Other

## 2022-02-08 DIAGNOSIS — R339 Retention of urine, unspecified: Secondary | ICD-10-CM

## 2022-02-08 DIAGNOSIS — I6529 Occlusion and stenosis of unspecified carotid artery: Secondary | ICD-10-CM

## 2022-02-08 DIAGNOSIS — I639 Cerebral infarction, unspecified: Secondary | ICD-10-CM | POA: Diagnosis not present

## 2022-02-08 DIAGNOSIS — R1310 Dysphagia, unspecified: Secondary | ICD-10-CM

## 2022-02-08 LAB — BASIC METABOLIC PANEL
Anion gap: 12 (ref 5–15)
BUN: 28 mg/dL — ABNORMAL HIGH (ref 8–23)
CO2: 18 mmol/L — ABNORMAL LOW (ref 22–32)
Calcium: 9.1 mg/dL (ref 8.9–10.3)
Chloride: 107 mmol/L (ref 98–111)
Creatinine, Ser: 1.14 mg/dL — ABNORMAL HIGH (ref 0.44–1.00)
GFR, Estimated: 51 mL/min — ABNORMAL LOW (ref >60)
Glucose, Bld: 160 mg/dL — ABNORMAL HIGH (ref 70–99)
Potassium: 4.5 mmol/L (ref 3.5–5.1)
Sodium: 137 mmol/L (ref 135–145)

## 2022-02-08 LAB — GLUCOSE, CAPILLARY
Glucose-Capillary: 113 mg/dL — ABNORMAL HIGH (ref 70–99)
Glucose-Capillary: 121 mg/dL — ABNORMAL HIGH (ref 70–99)
Glucose-Capillary: 127 mg/dL — ABNORMAL HIGH (ref 70–99)
Glucose-Capillary: 137 mg/dL — ABNORMAL HIGH (ref 70–99)
Glucose-Capillary: 147 mg/dL — ABNORMAL HIGH (ref 70–99)
Glucose-Capillary: 170 mg/dL — ABNORMAL HIGH (ref 70–99)
Glucose-Capillary: 182 mg/dL — ABNORMAL HIGH (ref 70–99)

## 2022-02-08 LAB — CBC
HCT: 32.5 % — ABNORMAL LOW (ref 36.0–46.0)
Hemoglobin: 10.9 g/dL — ABNORMAL LOW (ref 12.0–15.0)
MCH: 31.8 pg (ref 26.0–34.0)
MCHC: 33.5 g/dL (ref 30.0–36.0)
MCV: 94.8 fL (ref 80.0–100.0)
Platelets: 243 10*3/uL (ref 150–400)
RBC: 3.43 MIL/uL — ABNORMAL LOW (ref 3.87–5.11)
RDW: 13.8 % (ref 11.5–15.5)
WBC: 15.6 10*3/uL — ABNORMAL HIGH (ref 4.0–10.5)
nRBC: 0.1 % (ref 0.0–0.2)

## 2022-02-08 MED ORDER — SODIUM CHLORIDE 0.9 % IV SOLN
250.0000 mL | INTRAVENOUS | Status: DC
  Administered 2022-02-08: 250 mL via INTRAVENOUS

## 2022-02-08 NOTE — Progress Notes (Addendum)
STROKE TEAM PROGRESS NOTE   INTERVAL HISTORY Husband at the bedside. Patient is sitting in the chair. She c/o of pain all over and wants to go back to bed. She has been eating some food. She is alert and oriented, left facial droop.slight dysarthria or aphasia, can name objects and repeat.  Right gaze does not cross midline. Left arm and leg flaccid. Sensation in tact.   WBC up to 15.6 from 13.4. Afebrile. Will continue to monitor. Neurological exam stable. No new events overnight. VSS   Vitals:   02/07/22 2026 02/07/22 2352 02/08/22 0503 02/08/22 0744  BP: (!) 152/54 134/73 (!) 143/52 (!) 147/60  Pulse: 81 85 75 79  Resp: '20 18 18 18  '$ Temp: 98.3 F (36.8 C) 99.2 F (37.3 C) 97.9 F (36.6 C) 98.7 F (37.1 C)  TempSrc: Oral Oral Oral Oral  SpO2: 100% 100% 97% 100%  Weight:      Height:       CBC:  Recent Labs  Lab 02/07/22 0600 02/08/22 0233  WBC 13.4* 15.6*  HGB 10.5* 10.9*  HCT 31.8* 32.5*  MCV 95.2 94.8  PLT 229 825    Basic Metabolic Panel:  Recent Labs  Lab 02/03/22 1925 02/04/22 0443 02/06/22 0600 02/07/22 0600 02/08/22 0233  NA  --   --    < > 136 137  K  --   --    < > 4.1 4.5  CL  --   --    < > 108 107  CO2  --   --    < > 22 18*  GLUCOSE  --   --    < > 126* 160*  BUN  --   --    < > 23 28*  CREATININE  --   --    < > 1.04* 1.14*  CALCIUM  --   --    < > 8.6* 9.1  MG 2.2 2.4  --   --   --   PHOS 3.1 2.8  --   --   --    < > = values in this interval not displayed.    Lipid Panel:  No results for input(s): "CHOL", "TRIG", "HDL", "CHOLHDL", "VLDL", "LDLCALC" in the last 168 hours.  HgbA1c:  No results for input(s): "HGBA1C" in the last 168 hours.  Urine Drug Screen:  No results for input(s): "LABOPIA", "COCAINSCRNUR", "LABBENZ", "AMPHETMU", "THCU", "LABBARB" in the last 168 hours.   Alcohol Level  No results for input(s): "ETH" in the last 168 hours.   IMAGING past 24 hours No results found.  Physical Exam  Constitutional: Appears  well-developed and well-nourished elderly Caucasian lady.   Cardiovascular: Normal rate and regular rhythm.  Respiratory: Effort normal, non-labored breathing  Neuro: Mental Status: Patient is alert with eyes open. Right gaze preference,  does not cross midline. Follows commands speech with some dysarthria but oriented x3 Cranial Nerves: II: Right gaze preference, not able to cross midline III,IV, VI: EOMI without ptosis or diploplia..  Blinks to threat on the right and not on the left. VII: left facial droop  VIII: Hearing is intact to voice X: voice is hypophonic  XII: Tongue protrudes midline without atrophy or fasciculations.  Motor: Tone is normal. Bulk is normal.  Right full strength LUE 0/5 LLE 1/5 Sensory: No sensory deficit noted in any extremity     ASSESSMENT/PLAN Julia Deleon is a 76 y.o. female with history of arthritis, CKD, COPD, Fuch's corneal endothelial  dystrophy, HLD, HTN and hypothyroidism who presents to the ED via EMS as a Code Stroke with acute onset of left sided weakness, left facial droop and dysarthria with neglect. Initially symptoms improved in IR suite after TNK and she was taken back to her ED room. She then had a change in neurological exam and was reactivated as a Code IR.  Stroke: Right MCA infarct s/p TNK and IR with TICI2c, likely due to large vessel disease from R ICA high grade stenosis   Code Stroke CT head Loss of gray-white differentiation in the posterior right insular cortex and right lentiform nucleus compatible with acute/subacute right MCA infarct. Aspects 8/10 CTA head & neck Acute occlusion of the proximal right M1. Severe stenosis at the origin of the right ICA (>75%). MRI Acute/subacute small infarct involving the posterior right parietal cortex  MRA moderate stenosis right cavernous ICA, high-grade right M1 segment stenosis IR showed right M1 and ACA occlusion status post EVT with TICI3 and R MCA and proximal ICA stents.   Complicated by CC fistula s/p flow diverters and stable CT head 9/26 multifocal right hemisphere infarcts including right frontal gyrus, right BG, superior perirolandic cortex.  Trace IVH and SAH.  Right ICA siphon and MCA vascular stents. 2D Echo EF 55-60% LDL 125 HgbA1c 5.4 VTE prophylaxis -heparin subcu No antithrombotic prior to admission, now on aspirin 81 mg daily and Brilinta (ticagrelor) 90 mg bid.  Therapy recommendations:  CIR Disposition:  Pending  [facility]  procedure related complication Status post overlapping flow diverters Stable so far  May consider coiling in the future  Carotid stenosis CTA head neck showed right ICA > 75% stenosis Likely the cause of current stroke Status post ICA stenting On aspirin and Brilinta  Hx of hypertension hypotensive with bradycardia  Home meds:  Losartan '50mg'$  Likely related to carotid stenting Stable now resumed losartan at '25mg'$  BP goal less than 160 Long-term BP goal normotensive  Hyperlipidemia Home meds:  None LDL 125, goal < 70 Rosuvastatin 20 mg daily Continue statin at discharge  Dysphagia Cortrak tube in place  SLP following  On dysphagia 3 and honey thick liquid Change to nocturnal tube feeding to improve p.o. intake during the day  UTI  Leukocytosis Tmax 100.4->afebrile Leukocytosis WBC 11.9-9.9-10.3-> 13.4->15.6 UA WBC more than 50, on Rocephin CXR pending  Other Stroke Risk Factors Advanced Age >/= 80   Other Active Problems AKI  Cr 0.92-1.04-1.14 Put on IV fluid, continue tube feeding nocturnal Headache Added tylenol with codeine PRN Topamax '25mg'$  BID -> increased to '50mg'$   Fioricet PRN COPD  Hospital day # 10  Patient seen and examined by NP/APP with MD. MD to update note as needed.   August Albino  DNP, ACNPC-AG [facility] See Amion for schedule and pager information 02/08/2022 10:59 AM  ATTENDING NOTE: I reviewed above note and agree with the assessment and plan. Pt was seen  and examined.   Husband at bedside feeding patient for lunch.  Patient lethargic, however open eyes on voice, still has left hemiplegia.  Afebrile, persistent leukocytosis and creatinine trending up.  Encourage p.o. intake, will start gentle IV hydration, continue tube feeding at the night.  Will check chest x-ray.  Continue Rocephin.  Pending CIR.  For detailed assessment and plan, please refer to above/below as I have made changes wherever appropriate.   Rosalin Hawking, MD PhD Stroke Neurology 02/08/2022 6:33 PM

## 2022-02-08 NOTE — Progress Notes (Signed)
Inpatient Rehab Admissions Coordinator:  There is no bed available for this pt in CIR today. Will continue to follow.   Gayland Curry, [place], [place] Admissions Coordinator [phone]

## 2022-02-08 NOTE — Plan of Care (Signed)
  Problem: Activity: Goal: Ability to return to baseline activity level will improve Outcome: Progressing   Problem: Education: Goal: Knowledge of disease or condition will improve Outcome: Progressing   Problem: Nutrition: Goal: Adequate nutrition will be maintained Outcome: Progressing   Problem: Pain Managment: Goal: General experience of comfort will improve Outcome: Progressing   Problem: Skin Integrity: Goal: Risk for impaired skin integrity will decrease Outcome: Progressing

## 2022-02-08 NOTE — Progress Notes (Signed)
Physical Therapy Treatment Patient Details Name: Julia Deleon MRN: [id number] DOB: [birth year] Today's Date: 02/08/2022   History of Present Illness Patient is a 75 y/o female who presents on 9/22 with left sided weakness and slurred speech. Found to have Rt M1 occlusion s/p TNK and mechanical thrombectomy 9/22. Developed bradycardia with hypotension in ICU.  PMH includes COPD, back surgery, CKD.    PT Comments    Pt progressing towards all goals. Pt more verbal, orientedx4, and able to follow all commands with good initiation. Pt remains to have L hemiplegia with noted L inattention however is becoming more aware and is now looking to the L. Pt with impaired sensation t/o L UE and LE in addition to proprioception impairment limiting ability to sequence and use L LE in standing and for transfers. Pt with improved sitting EOB balance with max verbal and tactile cues to not push with R UE to the L. Acute PT to cont to follow. Pt remains appropriate for AIR upon d/c for maximal functional recovery and to decrease burden of care on family.    Recommendations for follow up therapy are one component of a multi-disciplinary discharge planning process, led by the attending physician.  Recommendations may be updated based on patient status, additional functional criteria and insurance authorization.  Follow Up Recommendations  Acute inpatient rehab (3hours/day)     Assistance Recommended at Discharge Frequent or constant Supervision/Assistance  Patient can return home with the following Two people to help with walking and/or transfers;A lot of help with bathing/dressing/bathroom;Assistance with cooking/housework;Direct supervision/assist for medications management;Assist for transportation;Help with stairs or ramp for entrance   Equipment Recommendations  Other (comment) (defer)    Recommendations for Other Services       Precautions / Restrictions Precautions Precautions: Fall;Other  (comment) Precaution Comments: NGT, L hemiparesis, frequent loose stools Restrictions Weight Bearing Restrictions: No     Mobility  Bed Mobility Overal bed mobility: Needs Assistance Bed Mobility: Sidelying to Sit   Sidelying to sit: Max assist       General bed mobility comments: pt in L sidelying for hygiene upon PT arrival, max directional verbal cues to complete sidelying to sit, maxA for trunk elevation, pt unable to use L UE functionally    Transfers Overall transfer level: Needs assistance Equipment used: 2 person hand held assist (face to face transfer with bed pad and gait belt) Transfers: Sit to/from Stand, Bed to chair/wheelchair/BSC Sit to Stand: Max assist, +2 physical assistance Stand pivot transfers: Max assist, +2 physical assistance         General transfer comment: able to achieve full upright standing this date, pt requiring L knee blocking to prevent buckling, maxA for weight shifting and to advance L LE to sequence  stepping to chair to the R    Ambulation/Gait               General Gait Details: unable   Stairs             Wheelchair Mobility    Modified Rankin (Stroke Patients Only) Modified Rankin (Stroke Patients Only) Pre-Morbid Rankin Score: No significant disability Modified Rankin: Severe disability     Balance Overall balance assessment: Needs assistance Sitting-balance support: Feet supported, Single extremity supported Sitting balance-Leahy Scale: Poor Sitting balance - Comments: pt initially pushing with R UE to the L and required maxA to maintain EOB balance, with max verbal cues pt able to maintain midline sitting balance with close min guard. Worked on scanning to  the L to find objects   Standing balance support: Bilateral upper extremity supported Standing balance-Leahy Scale: Zero Standing balance comment: dependent on external assist                            Cognition Arousal/Alertness:  Awake/alert Behavior During Therapy: Anxious Overall Cognitive Status: Impaired/Different from baseline Area of Impairment: Attention, Memory, Following commands, Safety/judgement, Problem solving                   Current Attention Level: Focused Memory: Decreased short-term memory Following Commands: Follows one step commands consistently, Follows one step commands with increased time Safety/Judgement: Decreased awareness of deficits, Decreased awareness of safety (L inattention)   Problem Solving: Slow processing, Requires verbal cues, Difficulty sequencing General Comments: pt awake and anxious regarding "I've been a huge bother this morning. It's not good Julia Deleon." RN tech present and providing hygiene s/p BM upon PT arrival, pt anxious over her condition. When asked where she was, pt stated Julia Deleon, and stated "I had a stroke last week" when asked why she was here        Exercises      General Comments General comments (skin integrity, edema, etc.): pt with constant liquid stool, pt anxious stating " I think I"m going to faint" BP 143/83, verbal cues to take deep breaths      Pertinent Vitals/Pain Pain Assessment Pain Assessment: Faces Faces Pain Scale: Hurts a little bit Pain Location: generalized groaning t/o session Pain Descriptors / Indicators: Discomfort, Grimacing    Home Living                          Prior Function            PT Goals (current goals can now be found in the care plan section) Acute Rehab PT Goals Patient Stated Goal: none stated PT Goal Formulation: Patient unable to participate in goal setting Time For Goal Achievement: 02/14/22 Potential to Achieve Goals: Fair Progress towards PT goals: Progressing toward goals    Frequency    Min 4X/week      PT Plan Current plan remains appropriate    Co-evaluation              AM-PAC PT "6 Clicks" Mobility   Outcome Measure  Help needed turning from your back to  your side while in a flat bed without using bedrails?: A Little Help needed moving from lying on your back to sitting on the side of a flat bed without using bedrails?: A Lot Help needed moving to and from a bed to a chair (including a wheelchair)?: A Lot Help needed standing up from a chair using your arms (e.g., wheelchair or bedside chair)?: A Lot Help needed to walk in hospital room?: Total Help needed climbing 3-5 steps with a railing? : Total 6 Click Score: 11    End of Session Equipment Utilized During Treatment: Gait belt Activity Tolerance: Patient tolerated treatment well Patient left: with call bell/phone within reach;with family/visitor present;in chair;with chair alarm set Nurse Communication: Mobility status PT Visit Diagnosis: Hemiplegia and hemiparesis;Difficulty in walking, not elsewhere classified (R26.2);Unsteadiness on feet (R26.81) Hemiplegia - Right/Left: Left Hemiplegia - dominant/non-dominant: Non-dominant Hemiplegia - caused by: Cerebral infarction     Time: 8182-9937 PT Time Calculation (min) (ACUTE ONLY): 30 min  Charges:  $Therapeutic Activity: 8-22 mins $Neuromuscular Re-education: 8-22 mins  Kittie Plater, PT, DPT Acute Rehabilitation Services Secure chat preferred Office #: [phone]    Berline Lopes 02/08/2022, 12:16 PM

## 2022-02-08 NOTE — Progress Notes (Signed)
Nutrition Follow-up   DOCUMENTATION CODES:   Not applicable  INTERVENTION:  Continue current diet as ordered, advance as able per SLP encourage PO intake  If adequate PO intake continues, would recommend removing cortrak tube. Continue nocturnal TF via Cortrak tube Osmolite 1.5 @ 80 ml/hr x 12 hours (8pm-8am)  This provides 971m of formula, 1440 kcal, and 60g of protein     NUTRITION DIAGNOSIS:  Inadequate oral intake related to inability to eat as evidenced by NPO status. - remains applicable  GOAL:   Patient will meet greater than or equal to 90% of their needs - diet in place, nocturnal TF  MONITOR:   TF tolerance  REASON FOR ASSESSMENT:   Consult, New TF Enteral/tube feeding initiation and management  ASSESSMENT:   76y.o. female admits related to acute onset of left sided weakness, left facial droop, and dysarthia with neglect. PMH includes: HLD, HTN, hypothyroidism, arthritis, CKD, COPD. Pt is currently receiving medical management for acute stroke, right MCA territory.  Pt sleeping at the time of assessment. No family at bedside. Lunch tray present on tray table well consumed (~80%). Breakfast also documented to be well consumed.   Cortrak tube remains in place but MD adjusted orders to nocturnal 9/30 which are meeting ~75% of estimated needs. If pt continue to have good intake of meals, would be in favor of removing cortrak tube.  Discussed with Neurology NP. Also recommended decreasing CBGs as TF are no longer continuous.  Average Meal Intake: 10/2: 90% intake x 1 recorded meals  Nutritionally Relevant Medications: Scheduled Meds:  amantadine  100 mg Per Tube BID   feeding supplement (OSMOLITE 1.5 CAL)  1,000 mL Per Tube Q24H   pantoprazole  40 mg Per Tube Daily   polyethylene glycol  17 g Per Tube BID   rosuvastatin  20 mg Per Tube Daily   senna-docusate  1 tablet Per Tube BID   Continuous Infusions:  cefTRIAXone (ROCEPHIN)  IV 1 g (02/07/22 1803)    Labs Reviewed: BUN 28, creatinine 1.14 CBG ranges from 121-182 mg/dL over the last 24 hours  NUTRITION - FOCUSED PHYSICAL EXAM: Flowsheet Row Most Recent Value  Orbital Region No depletion  Upper Arm Region Unable to assess  Thoracic and Lumbar Region Unable to assess  Buccal Region No depletion  Temple Region Mild depletion  Clavicle Bone Region No depletion  Clavicle and Acromion Bone Region No depletion  Scapular Bone Region No depletion  Dorsal Hand No depletion  Patellar Region Unable to assess  Anterior Thigh Region Unable to assess  Posterior Calf Region Unable to assess  Edema (RD Assessment) None  Hair Reviewed  Eyes Unable to assess  Mouth Unable to assess  Skin Reviewed  Nails Reviewed    Diet Order:   Diet Order             DIET DYS 3 Room service appropriate? Yes with Assist; Fluid consistency: Honey Thick  Diet effective now                   EDUCATION NEEDS:   Not appropriate for education at this time  Skin:  Skin Assessment: Reviewed RN Assessment  Last BM:  9/30 - type 6  Height:   Ht Readings from Last 1 Encounters:  01/29/22 '5\' 4"'$  (1.626 m)    Weight:   Wt Readings from Last 1 Encounters:  02/07/22 75.1 kg    Ideal Body Weight:  54.5 kg  BMI:  Body mass index  is 28.42 kg/m.  Estimated Nutritional Needs:  Kcal:  1600-1800 kcal/d Protein:  80-100 g/d Fluid:  >/=1.6L/d    Ranell Patrick, RD, LDN Clinical Dietitian RD pager # available in [facility]  After hours/weekend pager # available in [facility]

## 2022-02-09 ENCOUNTER — Inpatient Hospital Stay (HOSPITAL_COMMUNITY): Payer: Medicare Other

## 2022-02-09 DIAGNOSIS — R1312 Dysphagia, oropharyngeal phase: Secondary | ICD-10-CM

## 2022-02-09 DIAGNOSIS — D72829 Elevated white blood cell count, unspecified: Secondary | ICD-10-CM

## 2022-02-09 DIAGNOSIS — N3000 Acute cystitis without hematuria: Secondary | ICD-10-CM

## 2022-02-09 LAB — GLUCOSE, CAPILLARY
Glucose-Capillary: 166 mg/dL — ABNORMAL HIGH (ref 70–99)
Glucose-Capillary: 147 mg/dL — ABNORMAL HIGH (ref 70–99)
Glucose-Capillary: 133 mg/dL — ABNORMAL HIGH (ref 70–99)
Glucose-Capillary: 149 mg/dL — ABNORMAL HIGH (ref 70–99)
Glucose-Capillary: 97 mg/dL (ref 70–99)

## 2022-02-09 LAB — CBC
HCT: 34.2 % — ABNORMAL LOW (ref 36.0–46.0)
Hemoglobin: 11.2 g/dL — ABNORMAL LOW (ref 12.0–15.0)
MCH: 31.2 pg (ref 26.0–34.0)
MCHC: 32.7 g/dL (ref 30.0–36.0)
MCV: 95.3 fL (ref 80.0–100.0)
Platelets: 286 10*3/uL (ref 150–400)
RBC: 3.59 MIL/uL — ABNORMAL LOW (ref 3.87–5.11)
RDW: 13.8 % (ref 11.5–15.5)
WBC: 17.5 10*3/uL — ABNORMAL HIGH (ref 4.0–10.5)
nRBC: 0 % (ref 0.0–0.2)

## 2022-02-09 LAB — BASIC METABOLIC PANEL
Anion gap: 9 (ref 5–15)
BUN: 32 mg/dL — ABNORMAL HIGH (ref 8–23)
CO2: 21 mmol/L — ABNORMAL LOW (ref 22–32)
Calcium: 8.9 mg/dL (ref 8.9–10.3)
Chloride: 106 mmol/L (ref 98–111)
Creatinine, Ser: 1.03 mg/dL — ABNORMAL HIGH (ref 0.44–1.00)
GFR, Estimated: 57 mL/min — ABNORMAL LOW (ref >60)
Glucose, Bld: 108 mg/dL — ABNORMAL HIGH (ref 70–99)
Potassium: 4.5 mmol/L (ref 3.5–5.1)
Sodium: 136 mmol/L (ref 135–145)

## 2022-02-09 MED ORDER — OSMOLITE 1.5 CAL PO LIQD
500.0000 mL | ORAL | Status: DC
  Administered 2022-02-09: 500 mL
  Filled 2022-02-09: qty 711

## 2022-02-09 MED ORDER — AMANTADINE HCL 50 MG/5ML PO SOLN
200.0000 mg | Freq: Two times a day (BID) | ORAL | Status: DC
  Administered 2022-02-09 – 2022-02-11 (×4): 200 mg
  Filled 2022-02-09 (×5): qty 20

## 2022-02-09 NOTE — Progress Notes (Signed)
Inpatient Rehab Admissions Coordinator:    I do not have a CIR bed for this Pt. Today. I called and left a VM with pt.'s husband, as family needs to be aware that pt. Will likely d/c requiring significant physical assistance 24/7 or will need SNF placement following CIR. Await callback.   Clemens Catholic, [place], [place] Admissions Coordinator  [phone] ([place]) [phone] (office)

## 2022-02-09 NOTE — Progress Notes (Signed)
Physical Therapy Treatment Patient Details Name: Julia Deleon MRN: [id number] DOB: 1946-03-09 Today's Date: 02/09/2022   History of Present Illness Patient is a 76 y/o female who presents on 9/22 with left sided weakness and slurred speech. Found to have Rt M1 occlusion s/p TNK and mechanical thrombectomy 9/22. Developed bradycardia with hypotension in ICU.  PMH includes COPD, back surgery, CKD.    PT Comments    Pt worsening sitting EOB balance today compared to yesterday. Pt requiring constant maxA to maintain EOB balance. Pt able to state she is leaning L and would initiate correction with verbal cues but unable to maintain despite max verbal and directional cues. Pt c/o 8/10 headache in R temporal area. Pt overall more alert and responsive verbally however demonstrated decline in function today. Acute PT to cont to follow.    Recommendations for follow up therapy are one component of a multi-disciplinary discharge planning process, led by the attending physician.  Recommendations may be updated based on patient status, additional functional criteria and insurance authorization.  Follow Up Recommendations  Acute inpatient rehab (3hours/day)     Assistance Recommended at Discharge Frequent or constant Supervision/Assistance  Patient can return home with the following Two people to help with walking and/or transfers;A lot of help with bathing/dressing/bathroom;Assistance with cooking/housework;Direct supervision/assist for medications management;Assist for transportation;Help with stairs or ramp for entrance   Equipment Recommendations  Other (comment)    Recommendations for Other Services       Precautions / Restrictions Precautions Precautions: Fall;Other (comment) Precaution Comments: NGT, L hemiparesis, frequent loose stools Restrictions Weight Bearing Restrictions: No     Mobility  Bed Mobility Overal bed mobility: Needs Assistance Bed Mobility: Supine to Sit     Supine  to sit: Max assist     General bed mobility comments: pt initiated moving R LE to EOB with verbal cues, maxA for L LE Management and trunk elevation, pt did attempt to use R UE to assist with transfer    Transfers Overall transfer level: Needs assistance Equipment used: 2 person hand held assist (face to face transfer with use of bed pad) Transfers: Sit to/from Stand, Bed to chair/wheelchair/BSC Sit to Stand: Max assist, +2 physical assistance Stand pivot transfers: Max assist, +2 physical assistance         General transfer comment: L knee blocked, maxAX2 to power up and complete std pvt, pt unable to sequence stepping, pt continued with L lateral lean    Ambulation/Gait               General Gait Details: unable   Stairs             Wheelchair Mobility    Modified Rankin (Stroke Patients Only) Modified Rankin (Stroke Patients Only) Pre-Morbid Rankin Score: No significant disability Modified Rankin: Severe disability     Balance Overall balance assessment: Needs assistance Sitting-balance support: Feet supported, Single extremity supported Sitting balance-Leahy Scale: Zero Sitting balance - Comments: pt with significant L lateral lean today with inability to self correct and maintain despite max verbal and tactile cues, pt pushing with R UE to the L requiring constant verbal and tactile cues to hold L UE with R   Standing balance support: Bilateral upper extremity supported Standing balance-Leahy Scale: Zero Standing balance comment: dependent on external assist                            Cognition Arousal/Alertness: Awake/alert Behavior During Therapy:  Flat affect Overall Cognitive Status: Impaired/Different from baseline Area of Impairment: Attention, Memory, Following commands, Safety/judgement, Problem solving                   Current Attention Level: Focused Memory: Decreased short-term memory Following Commands: Follows one  step commands with increased time Safety/Judgement: Decreased awareness of safety, Decreased awareness of deficits (L inattention)   Problem Solving: Slow processing, Decreased initiation, Difficulty sequencing, Requires verbal cues, Requires tactile cues General Comments: pt awake, pt with significantly delayed processing, impaired righting reactions, pt with significant L lateral lean, pt able to state she is leaning that way when questioned, required verbal cues to initiate correction        Exercises      General Comments General comments (skin integrity, edema, etc.): VSS      Pertinent Vitals/Pain Pain Assessment Pain Assessment: Faces Pain Score: 8  Pain Location: headache, R temporal, RN notified Pain Descriptors / Indicators: Discomfort, Grimacing, Headache (pt with grimacing with L UE and LE ROM) Pain Intervention(s): Patient requesting pain meds-RN notified    Home Living                          Prior Function            PT Goals (current goals can now be found in the care plan section) Progress towards PT goals: Not progressing toward goals - comment (demo'd regression today)    Frequency    Min 4X/week      PT Plan Current plan remains appropriate    Co-evaluation              AM-PAC PT "6 Clicks" Mobility   Outcome Measure  Help needed turning from your back to your side while in a flat bed without using bedrails?: A Lot Help needed moving from lying on your back to sitting on the side of a flat bed without using bedrails?: A Lot Help needed moving to and from a bed to a chair (including a wheelchair)?: A Lot Help needed standing up from a chair using your arms (e.g., wheelchair or bedside chair)?: A Lot Help needed to walk in hospital room?: Total Help needed climbing 3-5 steps with a railing? : Total 6 Click Score: 10    End of Session Equipment Utilized During Treatment: Gait belt Activity Tolerance: Patient limited by  fatigue Patient left: in chair;with call bell/phone within reach;with chair alarm set Nurse Communication: Mobility status PT Visit Diagnosis: Hemiplegia and hemiparesis;Difficulty in walking, not elsewhere classified (R26.2);Unsteadiness on feet (R26.81) Hemiplegia - Right/Left: Left Hemiplegia - dominant/non-dominant: Non-dominant Hemiplegia - caused by: Cerebral infarction     Time: 0830-0900 PT Time Calculation (min) (ACUTE ONLY): 30 min  Charges:  $Therapeutic Activity: 8-22 mins $Neuromuscular Re-education: 8-22 mins                     Kittie Plater, PT, DPT Acute Rehabilitation Services Secure chat preferred Office #: [phone]    Berline Lopes 02/09/2022, 9:39 AM

## 2022-02-09 NOTE — TOC CAGE-AID Note (Signed)
Transition of Care [facility]) - CAGE-AID Screening   Patient Details  Name: Julia Deleon MRN: [id number] Date of Birth: 07/27/1945  [facility]) CM/SW Contact:    Coralee Pesa, [place] Phone Number: 02/09/2022, 9:53 AM   Clinical Narrative:  Per chart review, pt is disoriented and not appropriate for CAGE- AID assessment.  CAGE-AID Screening: Substance Abuse Screening unable to be completed due to: : Patient unable to participate             Substance Abuse Education Offered: No

## 2022-02-09 NOTE — Progress Notes (Signed)
Occupational Therapy Treatment Patient Details Name: Julia Deleon MRN: [id number] DOB: 1945/07/16 Today's Date: 02/09/2022   History of present illness Patient is a 76 y/o female who presents on 9/22 with left sided weakness and slurred speech. Found to have Rt M1 occlusion s/p TNK and mechanical thrombectomy 9/22. Developed bradycardia with hypotension in ICU.  PMH includes COPD, back surgery, CKD.   OT comments  Patient up in recliner upon entry with breakfast try. Patient positioned to feed self with RUE and food placed in midline to encourage visual scanning. Patient able to feed self with occasional assist and cues to scan to left.  Patient demonstrated coughing with eating eggs and switched to yogurt with decreased to no coughing.  PROM to LUE elbow and hand to address tone and patient repositioned in chair to address left lateral leaning. Acute OT to continue to follow.    Recommendations for follow up therapy are one component of a multi-disciplinary discharge planning process, led by the attending physician.  Recommendations may be updated based on patient status, additional functional criteria and insurance authorization.    Follow Up Recommendations  Acute inpatient rehab (3hours/day)    Assistance Recommended at Discharge Frequent or constant Supervision/Assistance  Patient can return home with the following  Two people to help with walking and/or transfers;Two people to help with bathing/dressing/bathroom;Direct supervision/assist for medications management;Direct supervision/assist for financial management;Assistance with feeding;Assistance with cooking/housework;Assist for transportation;Help with stairs or ramp for entrance   Equipment Recommendations  BSC/3in1;Wheelchair (measurements OT);Wheelchair cushion (measurements OT)    Recommendations for Other Services      Precautions / Restrictions Precautions Precautions: Fall;Other (comment) Precaution Comments: NGT, L  hemiparesis, frequent loose stools Restrictions Weight Bearing Restrictions: No       Mobility Bed Mobility Overal bed mobility: Needs Assistance             General bed mobility comments: up in recliner upon entry    Transfers Overall transfer level: Needs assistance                 General transfer comment: up in recliner     Balance Overall balance assessment: Needs assistance Sitting-balance support: Feet supported, Single extremity supported Sitting balance-Leahy Scale: Zero Sitting balance - Comments: seated in recliner with left lateral leaning and required repositioning Postural control: Left lateral lean                                 ADL either performed or assessed with clinical judgement   ADL Overall ADL's : Needs assistance/impaired Eating/Feeding: Minimal assistance;Sitting Eating/Feeding Details (indicate cue type and reason): food placed at midline with occasional cues to attend to left Grooming: Wash/dry face;Minimal assistance;Sitting Grooming Details (indicate cue type and reason): able to wash face with assistance to initiate                               General ADL Comments: patient seen for self feeding to address eating and attending to midlind and left    Extremity/Trunk Assessment Upper Extremity Assessment RUE Deficits / Details: generalized weakness LUE Deficits / Details: imparied sensory motor/impaired tone; moving LUE however not functionally/controlled or on command; complaining of pain L shoulder LUE Sensation: decreased light touch;decreased proprioception LUE Coordination: decreased fine motor;decreased gross motor            Vision  Perception     Praxis      Cognition Arousal/Alertness: Awake/alert Behavior During Therapy: Flat affect Overall Cognitive Status: Impaired/Different from baseline Area of Impairment: Attention, Memory, Following commands, Safety/judgement, Problem  solving                   Current Attention Level: Focused Memory: Decreased short-term memory Following Commands: Follows one step commands with increased time Safety/Judgement: Decreased awareness of safety, Decreased awareness of deficits   Problem Solving: Slow processing, Decreased initiation, Difficulty sequencing, Requires verbal cues, Requires tactile cues General Comments: became more lethargic by end of session        Exercises Exercises: General Upper Extremity General Exercises - Upper Extremity Elbow Flexion: PROM, Left, 10 reps, Seated Elbow Extension: PROM, Left, 10 reps, Seated Wrist Flexion: PROM, Left, 10 reps, Seated Wrist Extension: PROM, Left, 10 reps, Seated Digit Composite Flexion: PROM, Left, 10 reps, Seated Composite Extension: PROM, Left, 10 reps, Seated    Shoulder Instructions       General Comments VSS    Pertinent Vitals/ Pain       Pain Assessment Pain Assessment: Faces Faces Pain Scale: Hurts little more Pain Location: headache, LUE Pain Descriptors / Indicators: Headache, Discomfort, Grimacing Pain Intervention(s): Monitored during session, Repositioned  Home Living                                          Prior Functioning/Environment              Frequency  Min 2X/week        Progress Toward Goals  OT Goals(current goals can now be found in the care plan section)  Progress towards OT goals: Progressing toward goals  Acute Rehab OT Goals Patient Stated Goal: get better OT Goal Formulation: With patient Time For Goal Achievement: 02/15/22 Potential to Achieve Goals: Good ADL Goals Pt Will Perform Upper Body Dressing: with min assist;sitting Pt Will Perform Lower Body Dressing: with mod assist;sit to/from stand Pt Will Transfer to Toilet: with mod assist;stand pivot transfer;bedside commode Additional ADL Goal #1: Pt will attend to stimuli at midline with minimal cues to participate in ADLs   Plan Discharge plan remains appropriate    Co-evaluation                 AM-PAC OT "6 Clicks" Daily Activity     Outcome Measure   Help from another person eating meals?: A Little Help from another person taking care of personal grooming?: A Lot Help from another person toileting, which includes using toliet, bedpan, or urinal?: Total Help from another person bathing (including washing, rinsing, drying)?: A Lot Help from another person to put on and taking off regular upper body clothing?: A Lot Help from another person to put on and taking off regular lower body clothing?: Total 6 Click Score: 11    End of Session    OT Visit Diagnosis: Unsteadiness on feet (R26.81);Other abnormalities of gait and mobility (R26.89);Muscle weakness (generalized) (M62.81);Low vision, both eyes (H54.2);Apraxia (R48.2);Other symptoms and signs involving cognitive function;Other symptoms and signs involving the nervous system (R29.898);Pain;Hemiplegia and hemiparesis Hemiplegia - Right/Left: Left Hemiplegia - dominant/non-dominant: Non-Dominant Hemiplegia - caused by: Cerebral infarction Pain - Right/Left: Left Pain - part of body: Shoulder   Activity Tolerance Patient tolerated treatment well (became lethargic at end of session)   Patient Left in chair;with call bell/phone  within reach;with chair alarm set;with family/visitor present   Nurse Communication Other (comment) (percentage of meal consumed)        Time: 2353-6144 OT Time Calculation (min): 34 min  Charges: OT General Charges $OT Visit: 1 Visit OT Treatments $Self Care/Home Management : 23-37 mins  Lodema Hong, [place]  Office [phone]   Trixie Dredge 02/09/2022, 11:58 AM

## 2022-02-09 NOTE — Progress Notes (Signed)
STROKE TEAM PROGRESS NOTE   INTERVAL HISTORY [employer] are at the bedside. Patient is lying in bed, still has left hemiplegia. Pt ate well yesterday, 70% breakfast, 50% lunch and 60% dinner. Today, she seems lack of much appetite. Discussed with dietitian will cut half of her nocturnal TF to facilitate eating during the day. Pending CIR vs. SNF.  Vitals:   02/09/22 0418 02/09/22 0500 02/09/22 0740 02/09/22 1115  BP: (!) 162/71  (!) 137/59 138/65  Pulse: 94  81 79  Resp: '17  18 14  '$ Temp: 98.3 F (36.8 C)  98 F (36.7 C) 99.1 F (37.3 C)  TempSrc: Oral  Axillary Oral  SpO2: 96%  100% 99%  Weight:  75.6 kg    Height:       CBC:  Recent Labs  Lab 02/07/22 0600 02/08/22 0233  WBC 13.4* 15.6*  HGB 10.5* 10.9*  HCT 31.8* 32.5*  MCV 95.2 94.8  PLT 229 852   Basic Metabolic Panel:  Recent Labs  Lab 02/03/22 1925 02/04/22 0443 02/06/22 0600 02/07/22 0600 02/08/22 0233  NA  --   --    < > 136 137  K  --   --    < > 4.1 4.5  CL  --   --    < > 108 107  CO2  --   --    < > 22 18*  GLUCOSE  --   --    < > 126* 160*  BUN  --   --    < > 23 28*  CREATININE  --   --    < > 1.04* 1.14*  CALCIUM  --   --    < > 8.6* 9.1  MG 2.2 2.4  --   --   --   PHOS 3.1 2.8  --   --   --    < > = values in this interval not displayed.   Lipid Panel:  No results for input(s): "CHOL", "TRIG", "HDL", "CHOLHDL", "VLDL", "LDLCALC" in the last 168 hours.  HgbA1c:  No results for input(s): "HGBA1C" in the last 168 hours.  Urine Drug Screen:  No results for input(s): "LABOPIA", "COCAINSCRNUR", "LABBENZ", "AMPHETMU", "THCU", "LABBARB" in the last 168 hours.   Alcohol Level  No results for input(s): "ETH" in the last 168 hours.   IMAGING past 24 hours DG CHEST PORT 1 VIEW  Result Date: 02/08/2022 CLINICAL DATA:  Leukocytosis EXAM: PORTABLE CHEST 1 VIEW COMPARISON:  None Available. FINDINGS: Cardiac shadow is within normal limits. Feeding catheter extends into the stomach. Aortic  calcifications are seen. Right PICC is noted in the superior right atrium. Lungs are clear. Skin fold is noted over the right chest. IMPRESSION: Tubes and lines as described above.  No acute abnormality noted. Electronically Signed   By: Inez Catalina M.D.   On: 02/08/2022 21:19    Physical Exam  Constitutional: Appears well-developed and well-nourished elderly Caucasian lady.   Cardiovascular: Normal rate and regular rhythm.  Respiratory: Effort normal, non-labored breathing  Neuro: Pt mildly drowsy sleepy but easy to arouse, she still has right gaze and not cross midline, orientated to age, place, and people. No aphasia but paucity of speech, following all simple commands, no dysathria either. Able to name 2/3 and repeat simple sentences but slow in response. Tracking bilaterally on the right visual field, not blinking to visual threat on the left. Left facial droop. Tongue midline. LUE flaccid, LLE no spontaneous movement on request  but withdraw to pain 3-/5. RUE and RLE at least 4/5. Sensation, coordination not cooperative and gait not tested.    ASSESSMENT/PLAN Ms. Sherrey C Gavia is a 76 y.o. female with history of arthritis, CKD, COPD, Fuch's corneal endothelial dystrophy, HLD, HTN and hypothyroidism who presents to the ED via EMS as a Code Stroke with acute onset of left sided weakness, left facial droop and dysarthria with neglect. Initially symptoms improved in IR suite after TNK and she was taken back to her ED room. She then had a change in neurological exam and was reactivated as a Code IR.  Stroke: Right MCA infarct s/p TNK and IR with TICI2c, likely due to large vessel disease from R ICA high grade stenosis   Code Stroke CT head Loss of gray-white differentiation in the posterior right insular cortex and right lentiform nucleus compatible with acute/subacute right MCA infarct. Aspects 8/10 CTA head & neck Acute occlusion of the proximal right M1. Severe stenosis at the origin of the right  ICA (>75%). MRI Acute/subacute small infarct involving the posterior right parietal cortex  MRA moderate stenosis right cavernous ICA, high-grade right M1 segment stenosis IR showed right M1 and ACA occlusion status post EVT with TICI3 and R MCA and proximal ICA stents.  Complicated by CC fistula s/p flow diverters and stable CT head 9/26 multifocal right hemisphere infarcts including right frontal gyrus, right BG, superior perirolandic cortex.  Trace IVH and SAH.  Right ICA siphon and MCA vascular stents. 2D Echo EF 55-60% LDL 125 HgbA1c 5.4 VTE prophylaxis -heparin subcu No antithrombotic prior to admission, now on aspirin 81 mg daily and Brilinta (ticagrelor) 90 mg bid.  Therapy recommendations:  CIR vs SNF Disposition:  Pending  [facility]  procedure related complication Status post overlapping flow diverters Stable so far  May consider coiling in the future  Carotid stenosis CTA head neck showed right ICA > 75% stenosis Likely the cause of current stroke Status post ICA stenting On aspirin and Brilinta  Hx of hypertension hypotensive with bradycardia  Home meds:  Losartan '50mg'$  Likely related to carotid stenting Stable now resumed losartan at '25mg'$  BP goal less than 160 Long-term BP goal normotensive  Hyperlipidemia Home meds:  None LDL 125, goal < 70 Rosuvastatin 20 mg daily Continue statin at discharge  Dysphagia Cortrak tube in place  SLP following  On dysphagia 3 and honey thick liquid Decrease nocturnal tube feeding 80->40cc/h to further facilitate p.o. intake during the day  Leukocytosis UTI Tmax 100.4->afebrile Leukocytosis WBC 11.9-9.9-10.3-> 13.4->15.6->17.5 UA WBC more than 50, on Rocephin CXR neg LE venous doppler pending  Other Stroke Risk Factors Advanced Age >/= 62   Other Active Problems AKI  Cr 0.92-1.04-1.14 Put on IV fluid, continue tube feeding nocturnal Headache Added tylenol with codeine PRN Topamax '25mg'$  BID -> increased to '50mg'$    Fioricet PRN COPD  Hospital day # 11   Rosalin Hawking, MD PhD Stroke Neurology 02/09/2022 1:02 PM

## 2022-02-09 NOTE — Plan of Care (Signed)
  Problem: Activity: Goal: Ability to return to baseline activity level will improve Outcome: Progressing   Problem: Education: Goal: Knowledge of disease or condition will improve Outcome: Progressing   Problem: Nutrition: Goal: Adequate nutrition will be maintained Outcome: Progressing   Problem: Pain Managment: Goal: General experience of comfort will improve Outcome: Progressing   Problem: Safety: Goal: Ability to remain free from injury will improve Outcome: Progressing

## 2022-02-09 NOTE — Plan of Care (Signed)
  Problem: Education: Goal: Understanding of CV disease, CV risk reduction, and recovery process will improve Outcome: Progressing Goal: Individualized Educational Video(s) Outcome: Progressing   Problem: Activity: Goal: Ability to return to baseline activity level will improve Outcome: Progressing   Problem: Cardiovascular: Goal: Ability to achieve and maintain adequate cardiovascular perfusion will improve Outcome: Progressing Goal: Vascular access site(s) Level 0-1 will be maintained Outcome: Progressing   Problem: Health Behavior/Discharge Planning: Goal: Ability to safely manage health-related needs after discharge will improve Outcome: Progressing   Problem: Education: Goal: Knowledge of disease or condition will improve Outcome: Progressing Goal: Knowledge of secondary prevention will improve (SELECT ALL) Outcome: Progressing Goal: Knowledge of patient specific risk factors will improve (INDIVIDUALIZE FOR PATIENT) Outcome: Progressing Goal: Individualized Educational Video(s) Outcome: Progressing   Problem: Coping: Goal: Will verbalize positive feelings about self Outcome: Progressing Goal: Will identify appropriate support needs Outcome: Progressing   Problem: Health Behavior/Discharge Planning: Goal: Ability to manage health-related needs will improve Outcome: Progressing   Problem: Education: Goal: Knowledge of General Education information will improve Description: Including pain rating scale, medication(s)/side effects and non-pharmacologic comfort measures Outcome: Progressing   Problem: Health Behavior/Discharge Planning: Goal: Ability to manage health-related needs will improve Outcome: Progressing   Problem: Clinical Measurements: Goal: Ability to maintain clinical measurements within normal limits will improve Outcome: Progressing Goal: Will remain free from infection Outcome: Progressing Goal: Diagnostic test results will improve Outcome:  Progressing Goal: Respiratory complications will improve Outcome: Progressing Goal: Cardiovascular complication will be avoided Outcome: Progressing   Problem: Activity: Goal: Risk for activity intolerance will decrease Outcome: Progressing   Problem: Nutrition: Goal: Adequate nutrition will be maintained Outcome: Progressing   Problem: Coping: Goal: Level of anxiety will decrease Outcome: Progressing   Problem: Elimination: Goal: Will not experience complications related to bowel motility Outcome: Progressing Goal: Will not experience complications related to urinary retention Outcome: Progressing   Problem: Pain Managment: Goal: General experience of comfort will improve Outcome: Progressing   Problem: Safety: Goal: Ability to remain free from injury will improve Outcome: Progressing   Problem: Skin Integrity: Goal: Risk for impaired skin integrity will decrease Outcome: Progressing

## 2022-02-10 ENCOUNTER — Inpatient Hospital Stay (HOSPITAL_COMMUNITY): Payer: Medicare Other

## 2022-02-10 DIAGNOSIS — I639 Cerebral infarction, unspecified: Secondary | ICD-10-CM

## 2022-02-10 DIAGNOSIS — I6522 Occlusion and stenosis of left carotid artery: Secondary | ICD-10-CM

## 2022-02-10 LAB — CBC
HCT: 31.9 % — ABNORMAL LOW (ref 36.0–46.0)
Hemoglobin: 10.5 g/dL — ABNORMAL LOW (ref 12.0–15.0)
MCH: 31.3 pg (ref 26.0–34.0)
MCHC: 32.9 g/dL (ref 30.0–36.0)
MCV: 94.9 fL (ref 80.0–100.0)
Platelets: 253 10*3/uL (ref 150–400)
RBC: 3.36 MIL/uL — ABNORMAL LOW (ref 3.87–5.11)
RDW: 13.9 % (ref 11.5–15.5)
WBC: 17.9 10*3/uL — ABNORMAL HIGH (ref 4.0–10.5)
nRBC: 0 % (ref 0.0–0.2)

## 2022-02-10 LAB — BASIC METABOLIC PANEL
Anion gap: 11 (ref 5–15)
BUN: 29 mg/dL — ABNORMAL HIGH (ref 8–23)
CO2: 21 mmol/L — ABNORMAL LOW (ref 22–32)
Calcium: 8.8 mg/dL — ABNORMAL LOW (ref 8.9–10.3)
Chloride: 105 mmol/L (ref 98–111)
Creatinine, Ser: 1.13 mg/dL — ABNORMAL HIGH (ref 0.44–1.00)
GFR, Estimated: 51 mL/min — ABNORMAL LOW (ref >60)
Glucose, Bld: 129 mg/dL — ABNORMAL HIGH (ref 70–99)
Potassium: 4 mmol/L (ref 3.5–5.1)
Sodium: 137 mmol/L (ref 135–145)

## 2022-02-10 LAB — GLUCOSE, CAPILLARY
Glucose-Capillary: 117 mg/dL — ABNORMAL HIGH (ref 70–99)
Glucose-Capillary: 116 mg/dL — ABNORMAL HIGH (ref 70–99)
Glucose-Capillary: 100 mg/dL — ABNORMAL HIGH (ref 70–99)
Glucose-Capillary: 121 mg/dL — ABNORMAL HIGH (ref 70–99)

## 2022-02-10 MED ORDER — TOPIRAMATE 25 MG PO TABS
75.0000 mg | ORAL_TABLET | Freq: Two times a day (BID) | ORAL | Status: DC
  Administered 2022-02-10 – 2022-02-11 (×2): 75 mg
  Filled 2022-02-10 (×2): qty 3

## 2022-02-10 MED ORDER — SODIUM CHLORIDE 0.9 % IV SOLN
250.0000 mL | INTRAVENOUS | Status: DC
  Administered 2022-02-10 – 2022-02-12 (×4): 250 mL via INTRAVENOUS

## 2022-02-10 NOTE — Progress Notes (Signed)
Inpatient Rehab Admissions Coordinator:    I spoke with pt.'s husband regarding potential CIR admits. He states he would prefer CIR over SNF but is amenable to SNF placement following CIR (for short term rehab) if he does not progress to a level at which he can safely take her home. I do not have a CIR bed for this Pt. Today but will follow for potential admit pending bed availability.  Clemens Catholic, [place], [place] Admissions Coordinator  [phone] ([place]) [phone] (office)

## 2022-02-10 NOTE — Plan of Care (Signed)
  Problem: Coping: Goal: Will verbalize positive feelings about self Outcome: Progressing   Problem: Health Behavior/Discharge Planning: Goal: Ability to manage health-related needs will improve Outcome: Progressing   Problem: Clinical Measurements: Goal: Diagnostic test results will improve Outcome: Progressing   Problem: Activity: Goal: Risk for activity intolerance will decrease Outcome: Progressing   Problem: Nutrition: Goal: Adequate nutrition will be maintained Outcome: Progressing

## 2022-02-10 NOTE — Progress Notes (Signed)
Paged Dr Leonel Ramsay to notify that pt had a couple runs of beats of SVT and that she was asleep each time.  She is asymptomatic and even became annoyed that I woke her up.  Her vital signs are stable and she is resting comfortably.  Dr said he will order some am labs if they are not already ordered since she is asymptomatic.  No signs of distress.

## 2022-02-10 NOTE — Progress Notes (Signed)
Physical Therapy Treatment Patient Details Name: Julia Deleon MRN: [id number] DOB: [birth year] Today's Date: 02/10/2022   History of Present Illness Patient is a 76 y/o female who presents on 9/22 with left sided weakness and slurred speech. Found to have Rt M1 occlusion s/p TNK and mechanical thrombectomy 9/22. Developed bradycardia with hypotension in ICU.  PMH includes COPD, back surgery, CKD.    PT Comments    Pt continues with c/o R frontal headache at 10/10 in supine and sitting EOB of throbbing nature. Pt continues with dense left hemiparesis and L inattention requiring maxA for all transfers and OOB mobility. Continue to focus on static sitting balance as pt with strong L lateral lean requiring max directional verbal and tactile cues to correct despite reporting she is aware she is leaning to the L. Pt with discouraging spirits today and asking spouse to remove flowers from her room because she "can't stand it." Acute PT to cont to follow.    Recommendations for follow up therapy are one component of a multi-disciplinary discharge planning process, led by the attending physician.  Recommendations may be updated based on patient status, additional functional criteria and insurance authorization.  Follow Up Recommendations  Acute inpatient rehab (3hours/day)     Assistance Recommended at Discharge Frequent or constant Supervision/Assistance  Patient can return home with the following Two people to help with walking and/or transfers;A lot of help with bathing/dressing/bathroom;Assistance with cooking/housework;Direct supervision/assist for medications management;Assist for transportation;Help with stairs or ramp for entrance   Equipment Recommendations  Other (comment)    Recommendations for Other Services       Precautions / Restrictions Precautions Precautions: Fall;Other (comment) Precaution Comments: coretrak, L hemiparesis, L inattetion Restrictions Weight Bearing Restrictions:  No     Mobility  Bed Mobility Overal bed mobility: Needs Assistance Bed Mobility: Rolling, Supine to Sit Rolling: Min assist, Max assist   Supine to sit: Max assist     General bed mobility comments: pt rolled to the L with minA for hygiene but maxA to roll to the R despite max verbal cues, pt unable to use L UE and LE functionally to assist with transfer    Transfers Overall transfer level: Needs assistance Equipment used: 2 person hand held assist (face to face transfer with use of bed pad) Transfers: Sit to/from Stand, Bed to chair/wheelchair/BSC Sit to Stand: Max assist, +2 physical assistance   Step pivot transfers: Max assist, +2 physical assistance       General transfer comment: max verbal and tactile cues to sequencing stepping. L knee blocking, maxA via bed pad to facilitate weight shifting to step to chair, L knee buckling when knee not blocked, unable to use L UE to assist    Ambulation/Gait               General Gait Details: unable   Stairs             Wheelchair Mobility    Modified Rankin (Stroke Patients Only) Modified Rankin (Stroke Patients Only) Pre-Morbid Rankin Score: No significant disability Modified Rankin: Severe disability     Balance Overall balance assessment: Needs assistance Sitting-balance support: Feet supported, Single extremity supported Sitting balance-Leahy Scale: Zero Sitting balance - Comments: focused on static EOB balance, despite max verbal and tactile cues pt unable to maintain midline posture, pt continues to push with R UE to the L. pt is more able to maintain midline when asked to look to the L to find things but continues to  require modA to maintain EOB balance Postural control: Left lateral lean Standing balance support: Bilateral upper extremity supported Standing balance-Leahy Scale: Zero Standing balance comment: dependent on external assist                            Cognition  Arousal/Alertness: Awake/alert Behavior During Therapy: Flat affect Overall Cognitive Status: Impaired/Different from baseline Area of Impairment: Attention, Memory, Following commands, Safety/judgement, Problem solving                   Current Attention Level: Focused Memory: Decreased short-term memory Following Commands: Follows one step commands with increased time Safety/Judgement: Decreased awareness of safety, Decreased awareness of deficits   Problem Solving: Slow processing, Decreased initiation, Difficulty sequencing, Requires verbal cues, Requires tactile cues General Comments: pt slightly irritated and discouraged, pt continues with sever L neglect and 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        Exercises Other Exercises Other Exercises: supine bridging in bed with support at L LE x 10 reps    General Comments General comments (skin integrity, edema, etc.): VSS, pt +BM, dependent for hygiene, pt with redness from frequent stools, barrier cream applied      Pertinent Vitals/Pain Pain Assessment Pain Assessment: 0-10 Pain Score: 10-Worst pain ever Pain Location: headache across the front of head Pain Descriptors / Indicators: Headache Pain Intervention(s): Repositioned    Home Living                          Prior Function            PT Goals (current goals can now be found in the care plan section) Acute Rehab PT Goals Patient Stated Goal: none stated PT Goal Formulation: Patient unable to participate in goal setting Time For Goal Achievement: 02/14/22 Potential to Achieve Goals: Fair Progress towards PT goals: Progressing toward goals    Frequency    Min 4X/week      PT Plan Current plan remains appropriate    Co-evaluation              AM-PAC PT "6 Clicks" Mobility   Outcome Measure  Help needed turning from your back to your side while in a flat bed without using bedrails?: A Lot Help needed moving from lying on your back to  sitting on the side of a flat bed without using bedrails?: A Lot Help needed moving to and from a bed to a chair (including a wheelchair)?: A Lot Help needed standing up from a chair using your arms (e.g., wheelchair or bedside chair)?: A Lot Help needed to walk in hospital room?: Total Help needed climbing 3-5 steps with a railing? : Total 6 Click Score: 10    End of Session Equipment Utilized During Treatment: Gait belt Activity Tolerance: Patient tolerated treatment well Patient left: in chair;with call bell/phone within reach;with chair alarm set;with family/visitor present Nurse Communication: Mobility status PT Visit Diagnosis: Hemiplegia and hemiparesis;Difficulty in walking, not elsewhere classified (R26.2);Unsteadiness on feet (R26.81) Hemiplegia - Right/Left: Left Hemiplegia - dominant/non-dominant: Non-dominant Hemiplegia - caused by: Cerebral infarction     Time: 9562-1308 PT Time Calculation (min) (ACUTE ONLY): 39 min  Charges:  $Therapeutic Activity: 23-37 mins $Neuromuscular Re-education: 8-22 mins                     Kittie Plater, PT, DPT Acute Rehabilitation Services Secure chat preferred Office #:  [place] 02/10/2022, 11:59 AM

## 2022-02-10 NOTE — Progress Notes (Signed)
Bilateral lower extremity venous duplex completed. Refer to "CV Proc" under chart review to view preliminary results.  02/10/2022 4:49 PM Kelby Aline., MHA, RVT, RDCS, RDMS

## 2022-02-10 NOTE — Progress Notes (Addendum)
STROKE TEAM PROGRESS NOTE   INTERVAL HISTORY No family at bedside.  Ongoing complaints of a splitting headache and lack of appetite.  Nocturnal feedings decreased to 40 overnight.  Creatinine trending upward 0.92 -> 1.04 -> 1.14 -> 1.03 -> 1.13 IVF increased to 75cc/h due to kidney function trend PT recommends CIR placement, awaiting bed. Likely will need SNF following CIR.  Vascular US of LE pending  UTI+ on Rocephin 24 hour TMAX 99.1  Vitals:   02/09/22 2342 02/10/22 0400 02/10/22 0509 02/10/22 0752  BP: (!) 140/60 (!) 148/58  (!) 155/60  Pulse: 80 73  81  Resp:    16  Temp: 98 F (36.7 C) 98.2 F (36.8 C)  97.8 F (36.6 C)  TempSrc: Oral Oral  Oral  SpO2:  98%  96%  Weight:   69 kg   Height:       CBC:  Recent Labs  Lab 02/09/22 1259 02/10/22 0520  WBC 17.5* 17.9*  HGB 11.2* 10.5*  HCT 34.2* 31.9*  MCV 95.3 94.9  PLT 286 244    Basic Metabolic Panel:  Recent Labs  Lab 02/03/22 1925 02/04/22 0443 02/06/22 0600 02/09/22 1259 02/10/22 0520  NA  --   --    < > 136 137  K  --   --    < > 4.5 4.0  CL  --   --    < > 106 105  CO2  --   --    < > 21* 21*  GLUCOSE  --   --    < > 108* 129*  BUN  --   --    < > 32* 29*  CREATININE  --   --    < > 1.03* 1.13*  CALCIUM  --   --    < > 8.9 8.8*  MG 2.2 2.4  --   --   --   PHOS 3.1 2.8  --   --   --    < > = values in this interval not displayed.    Lipid Panel:  No results for input(s): "CHOL", "TRIG", "HDL", "CHOLHDL", "VLDL", "LDLCALC" in the last 168 hours.  HgbA1c:  No results for input(s): "HGBA1C" in the last 168 hours.  Urine Drug Screen:  No results for input(s): "LABOPIA", "COCAINSCRNUR", "LABBENZ", "AMPHETMU", "THCU", "LABBARB" in the last 168 hours.   Alcohol Level  No results for input(s): "ETH" in the last 168 hours.   IMAGING past 24 hours No results found.  Physical Exam  Constitutional: Appears well-developed and well-nourished elderly Caucasian lady.   Cardiovascular: Normal rate and  regular rhythm.  Respiratory: Effort normal, non-labored breathing  Neuro: Pt drowsy sleepy but easy to arouse to voice, she still has right gaze and not cross midline, orientated to age, place, and situation. No aphasia but paucity of speech, following all simple commands, no dysathria either. Able to name 2/3 and repeat simple sentences but slow in response. Tracking bilaterally on the right visual field, not blinking to visual threat on the left. Left facial droop. Tongue midline. LUE flaccid, LLE no spontaneous movement on request but withdraw to pain 3-/5. RUE 3/5 and RLE at least 4/5. Sensation, coordination not cooperative and gait not tested.  ASSESSMENT/PLAN [name] is a 76 y.o. female with history of arthritis, CKD, COPD, Fuch's corneal endothelial dystrophy, HLD, HTN and hypothyroidism who presents to the ED via EMS as a Code Stroke with acute onset of left sided weakness,  left facial droop and dysarthria with neglect. Initially symptoms improved in IR suite after TNK and she was taken back to her ED room. She then had a change in neurological exam and was reactivated as a Code IR.  Stroke: Right MCA infarct s/p TNK and IR with TICI2c, likely due to large vessel disease from R ICA high grade stenosis   Code Stroke CT head Loss of gray-white differentiation in the posterior right insular cortex and right lentiform nucleus compatible with acute/subacute right MCA infarct. Aspects 8/10 CTA head & neck Acute occlusion of the proximal right M1. Severe stenosis at the origin of the right ICA (>75%). MRI Acute/subacute small infarct involving the posterior right parietal cortex  MRA moderate stenosis right cavernous ICA, high-grade right M1 segment stenosis IR showed right M1 and ACA occlusion status post EVT with TICI3 and R MCA and proximal ICA stents.  Complicated by CC fistula s/p flow diverters and stable CT head 9/26 multifocal right hemisphere infarcts including right frontal gyrus,  right BG, superior perirolandic cortex.  Trace IVH and SAH.  Right ICA siphon and MCA vascular stents. CT repeat 10/4 pending 2D Echo EF 55-60% LDL 125 HgbA1c 5.4 VTE prophylaxis -heparin subcu No antithrombotic prior to admission, now on aspirin 81 mg daily and Brilinta (ticagrelor) 90 mg bid.  Therapy recommendations:  CIR   Disposition:  Pending  [facility]  Procedure related complication Status post overlapping flow diverters Stable so far  May consider coiling in the future  Carotid stenosis CTA head neck showed right ICA > 75% stenosis Likely the cause of current stroke Status post ICA stenting On aspirin and Brilinta  Hx of hypertension hypotensive with bradycardia  Home meds:  Losartan '50mg'$  Likely related to carotid stenting Stable now Resumed losartan at '25mg'$  BP goal less than 160 Long-term BP goal normotensive  Hyperlipidemia Home meds:  None LDL 125, goal < 70 Rosuvastatin 20 mg daily Continue statin at discharge  Headache Continue to complaining right sided HA CT repeat pending Fioricet and tylenol with codeine PRN Topamax '25mg'$ ->50->75 BID   Dysphagia Cortrak tube in place  SLP following  On dysphagia 3 and honey thick liquid D/c TF today as pt eating better Continue IVF for now given AKI  Leukocytosis UTI 24 hour Tmax 99.1->afebrile Leukocytosis WBC 11.9-9.9-10.3-> 13.4->15.6->17.5->17.9 UA WBC more than 50, on Rocephin CXR neg LE venous doppler pending  Other Stroke Risk Factors Advanced Age >/= 70   Other Active Problems AKI  Cr 0.92-1.04-1.14-1.03-1.13 Put on IV fluid, off tube feeding nocturnal Increased IVF to 75cc/h COPD  Hospital day # 12  -- Anibal Henderson, AGACNP-BC [facility] [phone]  ATTENDING NOTE: I reviewed above note and agree with the assessment and plan. Pt was seen and examined.   Husband is at the bedside. Pt sitting in chair, eating lunch. Per husband, pt is eating well today. Will d/c TF at  night, if continue doing well with po, will d/c cortrak. Still complaining of right sided HA, will check CT head. Cre fluctuating, on IVF @ 75cc. Continue monitoring. WBC slight elevated from yesterday, LE venous doppler pending. PT/OT recommend CIR. Pending placement.   For detailed assessment and plan, please refer to above/below as I have made changes wherever appropriate.   I had long discussion with husband at bedside, updated pt current condition, treatment plan and potential prognosis, and answered all the questions. He expressed understanding and appreciation.    Rosalin Hawking, MD PhD Stroke Neurology 02/10/2022 12:57 PM

## 2022-02-11 DIAGNOSIS — I63511 Cerebral infarction due to unspecified occlusion or stenosis of right middle cerebral artery: Principal | ICD-10-CM

## 2022-02-11 LAB — BASIC METABOLIC PANEL
Anion gap: 7 (ref 5–15)
BUN: 27 mg/dL — ABNORMAL HIGH (ref 8–23)
CO2: 20 mmol/L — ABNORMAL LOW (ref 22–32)
Calcium: 7.8 mg/dL — ABNORMAL LOW (ref 8.9–10.3)
Chloride: 106 mmol/L (ref 98–111)
Creatinine, Ser: 1.02 mg/dL — ABNORMAL HIGH (ref 0.44–1.00)
GFR, Estimated: 58 mL/min — ABNORMAL LOW (ref >60)
Glucose, Bld: 107 mg/dL — ABNORMAL HIGH (ref 70–99)
Potassium: 3.3 mmol/L — ABNORMAL LOW (ref 3.5–5.1)
Sodium: 133 mmol/L — ABNORMAL LOW (ref 135–145)

## 2022-02-11 LAB — GLUCOSE, CAPILLARY
Glucose-Capillary: 107 mg/dL — ABNORMAL HIGH (ref 70–99)
Glucose-Capillary: 138 mg/dL — ABNORMAL HIGH (ref 70–99)
Glucose-Capillary: 94 mg/dL (ref 70–99)
Glucose-Capillary: 108 mg/dL — ABNORMAL HIGH (ref 70–99)

## 2022-02-11 LAB — CBC
HCT: 28.6 % — ABNORMAL LOW (ref 36.0–46.0)
Hemoglobin: 9.7 g/dL — ABNORMAL LOW (ref 12.0–15.0)
MCH: 32 pg (ref 26.0–34.0)
MCHC: 33.9 g/dL (ref 30.0–36.0)
MCV: 94.4 fL (ref 80.0–100.0)
Platelets: 248 10*3/uL (ref 150–400)
RBC: 3.03 MIL/uL — ABNORMAL LOW (ref 3.87–5.11)
RDW: 13.9 % (ref 11.5–15.5)
WBC: 15.9 10*3/uL — ABNORMAL HIGH (ref 4.0–10.5)
nRBC: 0 % (ref 0.0–0.2)

## 2022-02-11 MED ORDER — TOPIRAMATE 100 MG PO TABS
100.0000 mg | ORAL_TABLET | Freq: Two times a day (BID) | ORAL | Status: DC
  Administered 2022-02-11 – 2022-02-16 (×10): 100 mg via ORAL
  Filled 2022-02-11 (×10): qty 1

## 2022-02-11 MED ORDER — ACETAMINOPHEN 325 MG PO TABS
650.0000 mg | ORAL_TABLET | Freq: Four times a day (QID) | ORAL | Status: DC
  Administered 2022-02-11 – 2022-02-16 (×21): 650 mg via ORAL
  Filled 2022-02-11 (×21): qty 2

## 2022-02-11 MED ORDER — POLYETHYLENE GLYCOL 3350 17 G PO PACK
17.0000 g | PACK | Freq: Two times a day (BID) | ORAL | Status: DC
  Administered 2022-02-11 – 2022-02-15 (×5): 17 g via ORAL
  Filled 2022-02-11 (×7): qty 1

## 2022-02-11 MED ORDER — ACETAMINOPHEN 650 MG RE SUPP: 650.0000 mg | Freq: Four times a day (QID) | RECTAL | Status: DC

## 2022-02-11 MED ORDER — AMANTADINE HCL 50 MG/5ML PO SOLN
200.0000 mg | Freq: Two times a day (BID) | ORAL | Status: DC
  Administered 2022-02-11 – 2022-02-15 (×8): 200 mg via ORAL
  Filled 2022-02-11 (×9): qty 20

## 2022-02-11 MED ORDER — LOSARTAN POTASSIUM 25 MG PO TABS
25.0000 mg | ORAL_TABLET | Freq: Every day | ORAL | Status: DC
  Administered 2022-02-12 – 2022-02-16 (×5): 25 mg via ORAL
  Filled 2022-02-11 (×5): qty 1

## 2022-02-11 MED ORDER — TOPIRAMATE 100 MG PO TABS: 100.0000 mg | ORAL_TABLET | Freq: Two times a day (BID) | ORAL | Status: DC

## 2022-02-11 MED ORDER — SENNOSIDES-DOCUSATE SODIUM 8.6-50 MG PO TABS
1.0000 | ORAL_TABLET | Freq: Two times a day (BID) | ORAL | Status: DC
  Administered 2022-02-11 – 2022-02-15 (×5): 1 via ORAL
  Filled 2022-02-11 (×7): qty 1

## 2022-02-11 MED ORDER — TICAGRELOR 90 MG PO TABS
90.0000 mg | ORAL_TABLET | Freq: Two times a day (BID) | ORAL | Status: DC
  Administered 2022-02-11 – 2022-02-16 (×10): 90 mg via ORAL
  Filled 2022-02-11 (×10): qty 1

## 2022-02-11 MED ORDER — PANTOPRAZOLE SODIUM 40 MG PO TBEC
40.0000 mg | DELAYED_RELEASE_TABLET | Freq: Every day | ORAL | Status: DC
  Administered 2022-02-12 – 2022-02-15 (×4): 40 mg via ORAL
  Filled 2022-02-11 (×5): qty 1

## 2022-02-11 MED ORDER — LEVOTHYROXINE SODIUM 75 MCG PO TABS
75.0000 ug | ORAL_TABLET | Freq: Every day | ORAL | Status: DC
  Administered 2022-02-12 – 2022-02-16 (×5): 75 ug via ORAL
  Filled 2022-02-11 (×5): qty 1

## 2022-02-11 MED ORDER — ACETAMINOPHEN 160 MG/5ML PO SOLN: 650.0000 mg | Freq: Four times a day (QID) | ORAL | Status: DC

## 2022-02-11 MED ORDER — ASPIRIN 81 MG PO CHEW
81.0000 mg | CHEWABLE_TABLET | Freq: Every day | ORAL | Status: DC
  Administered 2022-02-12 – 2022-02-16 (×5): 81 mg via ORAL
  Filled 2022-02-11 (×5): qty 1

## 2022-02-11 MED ORDER — ROSUVASTATIN CALCIUM 20 MG PO TABS
20.0000 mg | ORAL_TABLET | Freq: Every day | ORAL | Status: DC
  Administered 2022-02-12 – 2022-02-16 (×5): 20 mg via ORAL
  Filled 2022-02-11 (×5): qty 1

## 2022-02-11 NOTE — Progress Notes (Signed)
Inpatient Rehabilitation Admissions Coordinator   I met at bedside with patient ,daughter, Anderson Malta, and sister, Anderson Malta. I discussed her current progress with therapy . I await further progress with therapy as well as clarification of 24/7 physical care that can be provided by family or hired help before determining CIR admit then home, vs SNF. I also discussed barrier of cortrak to SNF placement. I will follow up tomorrow to assist with determining appropriate rehab venue.  Danne Baxter, RN, MSN Rehab Admissions Coordinator [phone] 02/11/2022 12:23 PM

## 2022-02-11 NOTE — Plan of Care (Signed)
  Problem: Activity: Goal: Ability to return to baseline activity level will improve Outcome: Progressing   Problem: Cardiovascular: Goal: Ability to achieve and maintain adequate cardiovascular perfusion will improve Outcome: Progressing   Problem: Coping: Goal: Will verbalize positive feelings about self Outcome: Progressing

## 2022-02-11 NOTE — Progress Notes (Signed)
STROKE TEAM PROGRESS NOTE   INTERVAL HISTORY Patient daughter and sister are at bedside.  Patient lying in bed, mildly lethargic, however, able to complain of constant headache without dysarthria.  Still has right gaze preference, left hemiplegia.  Pending CIR versus SNF.  Eating well, will DC core track.  Vitals:   02/10/22 2308 02/11/22 0333 02/11/22 0758 02/11/22 1133  BP: (!) 130/56 (!) 114/47 (!) 136/53 (!) 129/54  Pulse: 76 61 67 72  Resp: '18 18 18 14  '$ Temp: 98.8 F (37.1 C) 98.2 F (36.8 C) 98.4 F (36.9 C) 98.1 F (36.7 C)  TempSrc: Oral Axillary Axillary Oral  SpO2: 96% 100% 100% 99%  Weight:      Height:       CBC:  Recent Labs  Lab 02/10/22 0520 02/11/22 0640  WBC 17.9* 15.9*  HGB 10.5* 9.7*  HCT 31.9* 28.6*  MCV 94.9 94.4  PLT 253 387   Basic Metabolic Panel:  Recent Labs  Lab 02/10/22 0520 02/11/22 0640  NA 137 133*  K 4.0 3.3*  CL 105 106  CO2 21* 20*  GLUCOSE 129* 107*  BUN 29* 27*  CREATININE 1.13* 1.02*  CALCIUM 8.8* 7.8*   Lipid Panel:  No results for input(s): "CHOL", "TRIG", "HDL", "CHOLHDL", "VLDL", "LDLCALC" in the last 168 hours.  HgbA1c:  No results for input(s): "HGBA1C" in the last 168 hours.  Urine Drug Screen:  No results for input(s): "LABOPIA", "COCAINSCRNUR", "LABBENZ", "AMPHETMU", "THCU", "LABBARB" in the last 168 hours.   Alcohol Level  No results for input(s): "ETH" in the last 168 hours.   IMAGING past 24 hours VAS [place] LOWER EXTREMITY VENOUS (DVT)  Result Date: 02/10/2022  Lower Venous DVT Study Patient Name:  EMELIE NEWSOM  Date of Exam:   02/10/2022 Medical Rec #: [id number]    Accession #:    [id number] Date of Birth: [birth year]     Patient Gender: F Patient Age:   76 years Exam Location:  [facility] Procedure:      VAS [place] LOWER EXTREMITY VENOUS (DVT) Referring Phys: Cornelius Moras Nayah Lukens --------------------------------------------------------------------------------  Indications: Stroke.  Limitations: Poor patient  cooperation. Comparison Study: No prior study Performing Technologist: Maudry Mayhew MHA, RDMS, RVT, RDCS  Examination Guidelines: A complete evaluation includes B-mode imaging, spectral Doppler, color Doppler, and power Doppler as needed of all accessible portions of each vessel. Bilateral testing is considered an integral part of a complete examination. Limited examinations for reoccurring indications may be performed as noted. The reflux portion of the exam is performed with the patient in reverse Trendelenburg.  +---------+---------------+---------+-----------+----------+--------------+ RIGHT    CompressibilityPhasicitySpontaneityPropertiesThrombus Aging +---------+---------------+---------+-----------+----------+--------------+ CFV      Full           Yes      Yes                                 +---------+---------------+---------+-----------+----------+--------------+ SFJ      Full                                                        +---------+---------------+---------+-----------+----------+--------------+ FV Prox  Full                                                        +---------+---------------+---------+-----------+----------+--------------+  FV Mid   Full                                                        +---------+---------------+---------+-----------+----------+--------------+ FV DistalFull                                                        +---------+---------------+---------+-----------+----------+--------------+ PFV      Full                                                        +---------+---------------+---------+-----------+----------+--------------+ POP      Full           Yes      Yes                                 +---------+---------------+---------+-----------+----------+--------------+ PTV      Full                                                         +---------+---------------+---------+-----------+----------+--------------+ PERO     Full                                                        +---------+---------------+---------+-----------+----------+--------------+   +---------+---------------+---------+-----------+----------+--------------+ LEFT     CompressibilityPhasicitySpontaneityPropertiesThrombus Aging +---------+---------------+---------+-----------+----------+--------------+ CFV      Full           Yes      Yes                                 +---------+---------------+---------+-----------+----------+--------------+ SFJ      Full                                                        +---------+---------------+---------+-----------+----------+--------------+ FV Prox  Full                                                        +---------+---------------+---------+-----------+----------+--------------+ FV Mid   Full                                                        +---------+---------------+---------+-----------+----------+--------------+  FV DistalFull                                                        +---------+---------------+---------+-----------+----------+--------------+ PFV      Full                                                        +---------+---------------+---------+-----------+----------+--------------+ POP      Full           Yes      Yes                                 +---------+---------------+---------+-----------+----------+--------------+ PTV      Full                                                        +---------+---------------+---------+-----------+----------+--------------+ PERO     Full                                                        +---------+---------------+---------+-----------+----------+--------------+     Summary: RIGHT: - There is no evidence of deep vein thrombosis in the lower extremity.  - No cystic structure found in  the popliteal fossa.  LEFT: - There is no evidence of deep vein thrombosis in the lower extremity.  - No cystic structure found in the popliteal fossa.  *See table(s) above for measurements and observations. Electronically signed by Harold Barban MD on 02/10/2022 at 10:37:17 PM.    Final     Physical Exam  Constitutional: Appears well-developed and well-nourished elderly Caucasian lady.   Cardiovascular: Normal rate and regular rhythm.  Respiratory: Effort normal, non-labored breathing  Neuro: Pt drowsy sleepy but easy to arouse, she still has right gaze and barely cross midline, orientated to age, place, and situation. No aphasia but paucity of speech, following all simple commands, no dysathria either. Able to name 2/3 and repeat simple sentences but slow in response. Tracking bilaterally on the right visual field, not blinking to visual threat on the left. Left facial droop. Tongue midline. LUE flaccid, LLE no spontaneous movement on request but withdraw to pain 3-/5. RUE 3/5 and RLE at least 4/5. Sensation, coordination not cooperative and gait not tested.  ASSESSMENT/PLAN Ms. Leila C Longanecker is a 76 y.o. female with history of arthritis, CKD, COPD, Fuch's corneal endothelial dystrophy, HLD, HTN and hypothyroidism who presents to the ED via EMS as a Code Stroke with acute onset of left sided weakness, left facial droop and dysarthria with neglect. Initially symptoms improved in IR suite after TNK and she was taken back to her ED room. She then had a change in neurological exam and was reactivated as a Code IR.  Stroke: Right MCA infarct s/p TNK and IR with TICI2c,  likely due to large vessel disease from R ICA high grade stenosis   Code Stroke CT head Loss of gray-white differentiation in the posterior right insular cortex and right lentiform nucleus compatible with acute/subacute right MCA infarct. Aspects 8/10 CTA head & neck Acute occlusion of the proximal right M1. Severe stenosis at the origin of  the right ICA (>75%). MRI Acute/subacute small infarct involving the posterior right parietal cortex  MRA moderate stenosis right cavernous ICA, high-grade right M1 segment stenosis IR showed right M1 and ACA occlusion status post EVT with TICI3 and R MCA and proximal ICA stents.  Complicated by CC fistula s/p flow diverters and stable CT head 9/26 multifocal right hemisphere infarcts including right frontal gyrus, right BG, superior perirolandic cortex.  Trace IVH and SAH.  Right ICA siphon and MCA vascular stents. CT repeat 10/4 stable infarct, no evidence of hemorrhage or new infarct. 2D Echo EF 55-60% LDL 125 HgbA1c 5.4 VTE prophylaxis -heparin subcu No antithrombotic prior to admission, now on aspirin 81 mg daily and Brilinta (ticagrelor) 90 mg bid.  Therapy recommendations:  CIR vs. SNF Disposition:  Pending  [facility]  Procedure related complication Status post overlapping flow diverters Stable so far  May consider coiling in the future  Carotid stenosis CTA head neck showed right ICA > 75% stenosis Likely the cause of current stroke Status post ICA stenting On aspirin and Brilinta  Hx of hypertension hypotensive with bradycardia  Home meds:  Losartan '50mg'$  Likely related to carotid stenting Stable now Resumed losartan at '25mg'$  BP goal less than 160 Long-term BP goal normotensive  Hyperlipidemia Home meds:  None LDL 125, goal < 70 Rosuvastatin 20 mg daily Continue statin at discharge  Headache Continue to complaining right sided HA CT repeat 10/4 stable Fioricet PRN and tylenol changed to scheduled dose Topamax '25mg'$ ->50->75->100 BID   Dysphagia DC Cortrak tube 10/5 SLP on board  On dysphagia 3 and honey thick liquid Continue IVF for now given AKI  Leukocytosis UTI 24 hour Tmax 99.1->afebrile Leukocytosis WBC 11.9-9.9-10.3-> 13.4->15.6->17.5->17.9-> 15.9 UA WBC more than 50, completed Rocephin course CXR neg LE venous doppler no DVT  Other Stroke Risk  Factors Advanced Age >/= 36   Other Active Problems AKI  Cr 0.92-1.04-1.14-1.03-1.13-1.02 Put on IV fluid, off tube feeding Continue IVF to 75cc/h COPD  Hospital day # 13  I had long discussion with daughter and sister at bedside, updated pt current condition, treatment plan and potential prognosis, and answered all the questions.  They expressed understanding and appreciation.    Rosalin Hawking, MD PhD Stroke Neurology 02/11/2022 3:15 PM

## 2022-02-11 NOTE — Plan of Care (Signed)
  Problem: Activity: Goal: Ability to return to baseline activity level will improve Outcome: Progressing   Problem: Coping: Goal: Will verbalize positive feelings about self Outcome: Progressing

## 2022-02-12 LAB — GLUCOSE, CAPILLARY
Glucose-Capillary: 112 mg/dL — ABNORMAL HIGH (ref 70–99)
Glucose-Capillary: 123 mg/dL — ABNORMAL HIGH (ref 70–99)
Glucose-Capillary: 124 mg/dL — ABNORMAL HIGH (ref 70–99)

## 2022-02-12 MED ORDER — FOOD THICKENER (SIMPLYTHICK): 1.0000 | ORAL | Status: DC | PRN

## 2022-02-12 MED ORDER — SODIUM CHLORIDE 0.9 % IV SOLN
250.0000 mL | INTRAVENOUS | Status: DC
  Administered 2022-02-13: 250 mL via INTRAVENOUS

## 2022-02-12 MED ORDER — NEPRO/CARBSTEADY PO LIQD
237.0000 mL | Freq: Two times a day (BID) | ORAL | Status: DC
  Administered 2022-02-12 – 2022-02-16 (×8): 237 mL via ORAL

## 2022-02-12 NOTE — Progress Notes (Signed)
Physical Therapy Treatment Patient Details Name: Julia Deleon MRN: [id number] DOB: 1945/12/07 Today's Date: 02/12/2022   History of Present Illness Patient is a 76 y/o female who presents on 9/22 with left sided weakness and slurred speech. Found to have Rt M1 occlusion s/p TNK and mechanical thrombectomy 9/22. Developed bradycardia with hypotension in ICU.  PMH includes COPD, back surgery, CKD.    PT Comments    Pt continues with dense L hemiparesis with flexor tone synergy at times. Pt continues with L neglect requiring constant verbal cues to tend to L side. Pt continues to push to the L with R UE despite verbal and tactile cues. Pt continues to require maxAx2 for OOB transfers. Pt with strong L lateral bias in sitting and standing. Continue to recommend AIR Upon d/c for maximal functional recovery and to decrease burden of care on family. Acute PT to cont to follow.    Recommendations for follow up therapy are one component of a multi-disciplinary discharge planning process, led by the attending physician.  Recommendations may be updated based on patient status, additional functional criteria and insurance authorization.  Follow Up Recommendations  Acute inpatient rehab (3hours/day)     Assistance Recommended at Discharge Frequent or constant Supervision/Assistance  Patient can return home with the following Two people to help with walking and/or transfers;A lot of help with bathing/dressing/bathroom;Assistance with cooking/housework;Direct supervision/assist for medications management;Assist for transportation;Help with stairs or ramp for entrance   Equipment Recommendations  Other (comment)    Recommendations for Other Services       Precautions / Restrictions Precautions Precautions: Fall;Other (comment) Precaution Comments: L hemiparesis, L inattention Restrictions Weight Bearing Restrictions: No     Mobility  Bed Mobility Overal bed mobility: Needs Assistance Bed  Mobility: Rolling, Sidelying to Sit Rolling: Min assist, Max assist Sidelying to sit: Max assist       General bed mobility comments: Pt able to roll to the L with minA however reuqires maxA to roll to the R due to inability to use L UE/LE, maxA for trunk elevation to sit EOB.    Transfers Overall transfer level: Needs assistance Equipment used: 2 person hand held assist (face to face transfer with use of bed pad) Transfers: Sit to/from Stand, Bed to chair/wheelchair/BSC Sit to Stand: Max assist, +2 physical assistance Stand pivot transfers: Max assist, +2 physical assistance         General transfer comment: max verbal and tactile cues to sequencing stepping. L knee blocking, maxA via bed pad to facilitate weight shifting to step to chair, L knee buckling when knee not blocked, unable to use L UE to assist, worked on sit to stand x 2 with L knee blocking and tactile cues for weight shift to the L to promote weightbearing through the left    Ambulation/Gait               General Gait Details: unable   Chief Strategy Officer    Modified Rankin (Stroke Patients Only) Modified Rankin (Stroke Patients Only) Pre-Morbid Rankin Score: No significant disability Modified Rankin: Severe disability     Balance Overall balance assessment: Needs assistance Sitting-balance support: Feet supported, Single extremity supported Sitting balance-Leahy Scale: Zero Sitting balance - Comments: focused on static EOB balance, despite max verbal and tactile cues pt unable to maintain midline posture, pt continues to push with R UE to the L. pt is more able to maintain midline  when asked to look to the L to find things but continues to require modA to maintain EOB balance Postural control: Left lateral lean Standing balance support: Bilateral upper extremity supported Standing balance-Leahy Scale: Zero Standing balance comment: dependent on external assist                             Cognition Arousal/Alertness: Awake/alert Behavior During Therapy: Flat affect Overall Cognitive Status: Impaired/Different from baseline Area of Impairment: Attention, Memory, Following commands, Safety/judgement, Problem solving                   Current Attention Level: Focused Memory: Decreased short-term memory Following Commands: Follows one step commands with increased time Safety/Judgement: Decreased awareness of safety, Decreased awareness of deficits (L neglect)   Problem Solving: Slow processing, Decreased initiation, Difficulty sequencing, Requires verbal cues, Requires tactile cues General Comments: continues with dense L inattention, pt frustrated with "nothing moves right" and the inability to move without assist, impaired proprioception        Exercises      General Comments General comments (skin integrity, edema, etc.): VSS, pt +BM, dependent for hygiene      Pertinent Vitals/Pain Pain Assessment Pain Assessment: Faces Faces Pain Scale: Hurts little more Pain Location: R shoulder initially then L shld when EOB, suspect due to subluxation    Home Living                          Prior Function            PT Goals (current goals can now be found in the care plan section) Acute Rehab PT Goals Patient Stated Goal: none stated Progress towards PT goals: Not progressing toward goals - comment    Frequency    Min 4X/week      PT Plan Current plan remains appropriate    Co-evaluation              AM-PAC PT "6 Clicks" Mobility   Outcome Measure  Help needed turning from your back to your side while in a flat bed without using bedrails?: A Lot Help needed moving from lying on your back to sitting on the side of a flat bed without using bedrails?: A Lot Help needed moving to and from a bed to a chair (including a wheelchair)?: A Lot Help needed standing up from a chair using your arms (e.g., wheelchair  or bedside chair)?: A Lot Help needed to walk in hospital room?: Total Help needed climbing 3-5 steps with a railing? : Total 6 Click Score: 10    End of Session Equipment Utilized During Treatment: Gait belt Activity Tolerance: Patient tolerated treatment well Patient left: in chair;with call bell/phone within reach;with chair alarm set;with family/visitor present Nurse Communication: Mobility status PT Visit Diagnosis: Hemiplegia and hemiparesis;Difficulty in walking, not elsewhere classified (R26.2);Unsteadiness on feet (R26.81) Hemiplegia - Right/Left: Left Hemiplegia - dominant/non-dominant: Non-dominant Hemiplegia - caused by: Cerebral infarction     Time: 8115-7262 PT Time Calculation (min) (ACUTE ONLY): 37 min  Charges:  $Therapeutic Activity: 8-22 mins $Neuromuscular Re-education: 8-22 mins                     Kittie Plater, PT, DPT Acute Rehabilitation Services Secure chat preferred Office #: [phone]    Berline Lopes 02/12/2022, 11:29 AM

## 2022-02-12 NOTE — Progress Notes (Signed)
Occupational Therapy Treatment Patient Details Name: Julia Deleon MRN: [id number] DOB: 01-07-1946 Today's Date: 02/12/2022   History of present illness Patient is a 76 y/o female who presents on 9/22 with left sided weakness and slurred speech. Found to have Rt M1 occlusion s/p TNK and mechanical thrombectomy 9/22. Developed bradycardia with hypotension in ICU.  PMH includes COPD, back surgery, CKD.   OT comments  Patient up in recliner completing lunch upon arrival. Patient instructed on hemi technique for donning pullover top with mod assist with frequent cues to attend to left and assistance to pullover head. Patient was mod assist to doff. Patient able to pull sock up at ankle level for right and not able to assist with left. PROM to LUE elbow and hand to facilitate movement with not active movement noted. Patient able to locate 3/3 items on left with heavy verbal cues. Patient continues to be a good candidate for AIR for continued OT treatment to address self care and functional transfers. Acute OT to continue to follow.    Recommendations for follow up therapy are one component of a multi-disciplinary discharge planning process, led by the attending physician.  Recommendations may be updated based on patient status, additional functional criteria and insurance authorization.    Follow Up Recommendations  Acute inpatient rehab (3hours/day)    Assistance Recommended at Discharge Frequent or constant Supervision/Assistance  Patient can return home with the following  Two people to help with walking and/or transfers;Two people to help with bathing/dressing/bathroom;Direct supervision/assist for medications management;Direct supervision/assist for financial management;Assistance with feeding;Assistance with cooking/housework;Assist for transportation;Help with stairs or ramp for entrance   Equipment Recommendations  BSC/3in1;Wheelchair (measurements OT);Wheelchair cushion (measurements OT)     Recommendations for Other Services      Precautions / Restrictions Precautions Precautions: Fall;Other (comment) Precaution Comments: L hemiparesis, L inattention Restrictions Weight Bearing Restrictions: No       Mobility Bed Mobility Overal bed mobility: Needs Assistance             General bed mobility comments: OOB in recliner    Transfers Overall transfer level: Needs assistance                 General transfer comment: patient seen from recliner     Balance Overall balance assessment: Needs assistance Sitting-balance support: Feet supported, Single extremity supported Sitting balance-Leahy Scale: Zero Sitting balance - Comments: when performing forward flexion exercises patient demonstrated right lateral and posterior leaning Postural control: Posterior lean, Left lateral lean                                 ADL either performed or assessed with clinical judgement   ADL Overall ADL's : Needs assistance/impaired Eating/Feeding: Supervision/ safety;Sitting Eating/Feeding Details (indicate cue type and reason): completing meal upon entry Grooming: Wash/dry face;Brushing hair;Minimal assistance;Sitting Grooming Details (indicate cue type and reason): assistance to comb left side         Upper Body Dressing : Moderate assistance;Sitting Upper Body Dressing Details (indicate cue type and reason): education on hemi technique Lower Body Dressing: Maximal assistance;Sitting/lateral leans Lower Body Dressing Details (indicate cue type and reason): able to pull up sock from ankle level on Right and unablet to perform on left               General ADL Comments: education on hemi technique while seated in recliner    Extremity/Trunk Assessment Upper Extremity Assessment RUE  Deficits / Details: generalized weakness LUE Deficits / Details: imparied sensory motor/impaired tone; moving LUE however not functionally/controlled or on command;  complaining of pain L shoulder LUE Sensation: decreased light touch;decreased proprioception LUE Coordination: decreased fine motor;decreased gross motor            Vision       Perception     Praxis      Cognition Arousal/Alertness: Awake/alert Behavior During Therapy: Flat affect Overall Cognitive Status: Impaired/Different from baseline Area of Impairment: Attention, Memory, Following commands, Safety/judgement, Problem solving                   Current Attention Level: Focused Memory: Decreased short-term memory Following Commands: Follows one step commands with increased time Safety/Judgement: Decreased awareness of safety, Decreased awareness of deficits   Problem Solving: Slow processing, Decreased initiation, Difficulty sequencing, Requires verbal cues, Requires tactile cues General Comments: able to attend to left with heavy cueing to locate items        Exercises Exercises: General Upper Extremity General Exercises - Upper Extremity Elbow Flexion: PROM, Left, 10 reps, Seated Elbow Extension: PROM, Left, 10 reps, Seated Wrist Flexion: PROM, Left, 10 reps, Seated Wrist Extension: PROM, Left, 10 reps, Seated Digit Composite Flexion: PROM, Left, 10 reps, Seated Composite Extension: PROM, Left, 10 reps, Seated    Shoulder Instructions       General Comments VSS, pt +BM, dependent for hygiene    Pertinent Vitals/ Pain       Pain Assessment Pain Assessment: Faces Faces Pain Scale: Hurts a little bit Pain Location: LUE with movement Pain Descriptors / Indicators: Guarding Pain Intervention(s): Limited activity within patient's tolerance, Monitored during session, Repositioned  Home Living                                          Prior Functioning/Environment              Frequency  Min 2X/week        Progress Toward Goals  OT Goals(current goals can now be found in the care plan section)  Progress towards OT goals:  Progressing toward goals  Acute Rehab OT Goals Patient Stated Goal: get better OT Goal Formulation: With patient/family Time For Goal Achievement: 02/15/22 Potential to Achieve Goals: Good ADL Goals Pt Will Perform Upper Body Dressing: with min assist;sitting Pt Will Perform Lower Body Dressing: with mod assist;sit to/from stand Pt Will Transfer to Toilet: with mod assist;stand pivot transfer;bedside commode Additional ADL Goal #1: Pt will attend to stimuli at midline with minimal cues to participate in ADLs  Plan Discharge plan remains appropriate    Co-evaluation                 AM-PAC OT "6 Clicks" Daily Activity     Outcome Measure   Help from another person eating meals?: A Little Help from another person taking care of personal grooming?: A Lot Help from another person toileting, which includes using toliet, bedpan, or urinal?: Total Help from another person bathing (including washing, rinsing, drying)?: A Lot Help from another person to put on and taking off regular upper body clothing?: A Lot Help from another person to put on and taking off regular lower body clothing?: Total 6 Click Score: 11    End of Session    OT Visit Diagnosis: Unsteadiness on feet (R26.81);Other abnormalities of gait and mobility (  R26.89);Muscle weakness (generalized) (M62.81);Low vision, both eyes (H54.2);Apraxia (R48.2);Other symptoms and signs involving cognitive function;Other symptoms and signs involving the nervous system (R29.898);Pain;Hemiplegia and hemiparesis Hemiplegia - Right/Left: Left Hemiplegia - dominant/non-dominant: Non-Dominant Hemiplegia - caused by: Cerebral infarction Pain - Right/Left: Left Pain - part of body: Shoulder   Activity Tolerance Patient tolerated treatment well   Patient Left in chair;with call bell/phone within reach;with chair alarm set;with family/visitor present   Nurse Communication Mobility status        Time: 3976-7341 OT Time Calculation  (min): 28 min  Charges: OT General Charges $OT Visit: 1 Visit OT Treatments $Self Care/Home Management : 8-22 mins $Neuromuscular Re-education: 8-22 mins  Lodema Hong, [place]  Office [phone]   Trixie Dredge 02/12/2022, 2:16 PM

## 2022-02-12 NOTE — Progress Notes (Signed)
Nutrition Follow-up  DOCUMENTATION CODES:  Not applicable  INTERVENTION:  Continue current diet as ordered per SLP Encourage PO intake Magic cup TID with meals, each supplement provides 290 kcal and 9 grams of protein Nepro Shake po BID, each supplement provides 425 kcal and 19 grams protein and is nectar thick consistency   NUTRITION DIAGNOSIS:  Inadequate oral intake related to dysphagia as evidenced by  (routinely consuming <75% of meal). - remains applicable, adjusted parameters 10/6  GOAL:  Patient will meet greater than or equal to 90% of their needs - progressing, diet advanced, supplements in place  MONITOR:  PO intake, Supplement acceptance, Diet advancement  REASON FOR ASSESSMENT:  Consult, New TF Enteral/tube feeding initiation and management  ASSESSMENT:   76 y.o. female admits related to acute onset of left sided weakness, left facial droop, and dysarthia with neglect. PMH includes: HLD, HTN, hypothyroidism, arthritis, CKD, COPD. Pt is currently receiving medical management for acute stroke, right MCA territory.  9/26 - cortrak placed 9/28 - MBS, SLP recommendations: DYS3/honey 9/30 - TF adjusted to nocturnal 10/5 - Cortrak removed 10/6 - SLP evaluation, DYS3/nectar  Pt working with SLP at first attempted visit and with PT at second attempt. Pt had her cortrak removed yesterday after showing consistent intake of meals. Pt reportedly eats well when husband is present. Liquids upgraded to nectar now. Will add nutrition supplements to augment intake. Noted that pt will be discharged to rehab prior to returning home.    Average Meal Intake: 10/2-10/5: 62% intake x 11 recorded meals  Nutritionally Relevant Medications: Scheduled Meds:  pantoprazole  40 mg Oral Daily   polyethylene glycol  17 g Oral BID   rosuvastatin  20 mg Oral Daily   senna-docusate  1 tablet Oral BID   Continuous Infusions:  sodium chloride 250 mL (02/12/22 0707)   Labs  Reviewed  NUTRITION - FOCUSED PHYSICAL EXAM: Flowsheet Row Most Recent Value  Orbital Region No depletion  Upper Arm Region Unable to assess  Thoracic and Lumbar Region Unable to assess  Buccal Region No depletion  Temple Region Mild depletion  Clavicle Bone Region No depletion  Clavicle and Acromion Bone Region No depletion  Scapular Bone Region No depletion  Dorsal Hand No depletion  Patellar Region Unable to assess  Anterior Thigh Region Unable to assess  Posterior Calf Region Unable to assess  Edema (RD Assessment) None  Hair Reviewed  Eyes Unable to assess  Mouth Unable to assess  Skin Reviewed  Nails Reviewed    Diet Order:   Diet Order             DIET DYS 3 Room service appropriate? Yes with Assist; Fluid consistency: Nectar Thick  Diet effective now                   EDUCATION NEEDS:  Not appropriate for education at this time  Skin:  Skin Assessment: Reviewed RN Assessment  Last BM:  10/5  Height:  Ht Readings from Last 1 Encounters:  01/29/22 '5\' 4"'$  (1.626 m)   Weight:  Wt Readings from Last 1 Encounters:  02/10/22 69 kg    Ideal Body Weight:  54.5 kg  BMI:  Body mass index is 26.11 kg/m.  Estimated Nutritional Needs:  Kcal:  1600-1800 kcal/d Protein:  80-100 g/d Fluid:  >/=1.6L/d    Ranell Patrick, RD, LDN Clinical Dietitian RD pager # available in [facility]  After hours/weekend pager # available in [facility]

## 2022-02-12 NOTE — Progress Notes (Signed)
Speech Language Pathology Treatment: Dysphagia  Patient Details Name: Julia Deleon MRN: [id number] DOB: Jul 27, 1945 Today's Date: 02/12/2022 Time: 0810-0840 SLP Time Calculation (min) (ACUTE ONLY): 30 min  Assessment / Plan / Recommendation Clinical Impression  Pt seen to address dysphagia and cognitive linguistic difficulties.  She continues with significant neglect and benefitef from total assist to use compensation via following hand to the left of the tray to locate food items on left.  With direction questions, re: impaired sensation with recommendation to put food in right- she required total assist/cues to state.  However she demonstrated excellent recall of clinical reasoning with teach back regarding thin liquid aspiration and increased pulmonary risk.  Flat affect noted with report of headache - for which RN provided pain medicine.   Advanced trials of liquids trialed including thin water and nectar thick juice. Immediate cough post-swallow of thin water -but no indication of aspiration with nectar and swallow was timely.  Pt's mentation is much improved compared to prior treatment session - thus recommend advance liquids to nectar and continue dys3 and water protocol.   Posted swallow precaution signs and informed RN of change.  Pt is making excellent gains toward her speech/swallow goals and SLP provided her encouragement.    HPI HPI: 76 year old female patient who was admitted on 9/22 for acute onset left-sided weakness, left facial droop, dysarthria and neglect. CT angiogram of head and neck showed acute occlusion of proximal right M1 and nonopacification of right M2/M3, severe stenosis at right ICA. Pt now s/p TNK and thrombectomy. Bradycardia and hypotention noted post op.Pt underwent a BSE and MBS - diet was started as dys3/honey thick.  Follow up indicated for dysphagia management and cognitive linguistic treatment.      SLP Plan  Continue with current plan of care       Recommendations for follow up therapy are one component of a multi-disciplinary discharge planning process, led by the attending physician.  Recommendations may be updated based on patient status, additional functional criteria and insurance authorization.    Recommendations  Diet recommendations: Dysphagia 3 (mechanical soft);Nectar-thick liquid;Other(comment) (water protocol) Medication Administration: Whole meds with puree Supervision: Intermittent supervision to cue for compensatory strategies Compensations: Small sips/bites;Slow rate;Lingual sweep for clearance of pocketing;Minimize environmental distractions Postural Changes and/or Swallow Maneuvers: Seated upright 90 degrees                Oral Care Recommendations: Oral care BID Follow Up Recommendations: Acute inpatient rehab (3hours/day) Assistance recommended at discharge: Frequent or constant Supervision/Assistance SLP Visit Diagnosis: Dysphagia, oropharyngeal phase (R13.12);Cognitive communication deficit (R41.841) Plan: Continue with current plan of care          Julia Lime, MS [place] Office [phone] Pager [phone]  Julia Deleon  02/12/2022, 8:49 AM

## 2022-02-12 NOTE — Progress Notes (Addendum)
STROKE TEAM PROGRESS NOTE   INTERVAL HISTORY Patients husband is at the bedside. Patient is sitting up in the chair eating lunch. Neurological exam is unchanged, still with left hemiplegia- left arm worse than left leg, slightly able to lift off the chair. Still with right gaze preference but can cross midline  VSS.   Vitals:   02/11/22 2013 02/11/22 2359 02/12/22 0415 02/12/22 0723  BP: (!) 132/51 (!) 149/48 130/61 131/60  Pulse: 63 67 70 64  Resp: '16 18 20 17  '$ Temp: 98.5 F (36.9 C) 98.6 F (37 C) 97.7 F (36.5 C) 98.3 F (36.8 C)  TempSrc: Oral Oral Oral Oral  SpO2: 97% 98% 99% 98%  Weight:      Height:       CBC:  Recent Labs  Lab 02/10/22 0520 02/11/22 0640  WBC 17.9* 15.9*  HGB 10.5* 9.7*  HCT 31.9* 28.6*  MCV 94.9 94.4  PLT 253 992    Basic Metabolic Panel:  Recent Labs  Lab 02/10/22 0520 02/11/22 0640  NA 137 133*  K 4.0 3.3*  CL 105 106  CO2 21* 20*  GLUCOSE 129* 107*  BUN 29* 27*  CREATININE 1.13* 1.02*  CALCIUM 8.8* 7.8*    Lipid Panel:  No results for input(s): "CHOL", "TRIG", "HDL", "CHOLHDL", "VLDL", "LDLCALC" in the last 168 hours.  HgbA1c:  No results for input(s): "HGBA1C" in the last 168 hours.  Urine Drug Screen:  No results for input(s): "LABOPIA", "COCAINSCRNUR", "LABBENZ", "AMPHETMU", "THCU", "LABBARB" in the last 168 hours.   Alcohol Level  No results for input(s): "ETH" in the last 168 hours.   IMAGING past 24 hours No results found.  Physical Exam  Constitutional: Appears well-developed and well-nourished elderly Caucasian lady.   Cardiovascular: Normal rate and regular rhythm.  Respiratory: Effort normal, non-labored breathing  Neuro: Pt drowsy sleepy but easy to arouse, she still has right gaze and barely cross midline, orientated to age, place, and situation. No aphasia but paucity of speech, following all simple commands, no dysathria either. Able to name 2/3 and repeat simple sentences but slow in response. Tracking  bilaterally on the right visual field, not blinking to visual threat on the left. Left facial droop. Tongue midline. LUE flaccid, LLE no spontaneous movement on request but withdraw to pain 3-/5. RUE 3/5 and RLE at least 4/5. Sensation, coordination not cooperative and gait not tested.  ASSESSMENT/PLAN [name] is a 76 y.o. female with history of arthritis, CKD, COPD, Fuch's corneal endothelial dystrophy, HLD, HTN and hypothyroidism who presents to the ED via EMS as a Code Stroke with acute onset of left sided weakness, left facial droop and dysarthria with neglect. Initially symptoms improved in IR suite after TNK and she was taken back to her ED room. She then had a change in neurological exam and was reactivated as a Code IR.  Stroke: Right MCA infarct s/p TNK and IR with TICI2c, likely due to large vessel disease from R ICA high grade stenosis   Code Stroke CT head Loss of gray-white differentiation in the posterior right insular cortex and right lentiform nucleus compatible with acute/subacute right MCA infarct. Aspects 8/10 CTA head & neck Acute occlusion of the proximal right M1. Severe stenosis at the origin of the right ICA (>75%). MRI Acute/subacute small infarct involving the posterior right parietal cortex  MRA moderate stenosis right cavernous ICA, high-grade right M1 segment stenosis IR showed right M1 and ACA occlusion status post EVT with TICI3 and  R MCA and proximal ICA stents.  Complicated by CC fistula s/p flow diverters and stable CT head 9/26 multifocal right hemisphere infarcts including right frontal gyrus, right BG, superior perirolandic cortex.  Trace IVH and SAH.  Right ICA siphon and MCA vascular stents. CT repeat 10/4 stable infarct, no evidence of hemorrhage or new infarct. 2D Echo EF 55-60% LDL 125 HgbA1c 5.4 VTE prophylaxis -heparin subcu No antithrombotic prior to admission, now on aspirin 81 mg daily and Brilinta (ticagrelor) 90 mg bid.  Therapy  recommendations:  CIR vs. SNF Disposition:  Pending  [facility]  Procedure related complication Status post overlapping flow diverters Stable so far  May consider coiling in the future  Carotid stenosis CTA head neck showed right ICA > 75% stenosis Likely the cause of current stroke Status post ICA stenting On aspirin and Brilinta  Hx of hypertension hypotensive with bradycardia  Home meds:  Losartan '50mg'$  Likely related to carotid stenting Stable now Resumed losartan at '25mg'$  BP goal less than 160 Long-term BP goal normotensive  Hyperlipidemia Home meds:  None LDL 125, goal < 70 Rosuvastatin 20 mg daily Continue statin at discharge  Headache Continue to complaining right sided HA CT repeat 10/4 stable Fioricet PRN and tylenol changed to scheduled dose Topamax '25mg'$ ->50->75->100 BID   Dysphagia DC Cortrak tube 10/5 SLP on board  On dysphagia 3 and honey thick liquid Continue IVF for now given AKI  Leukocytosis UTI 24 hour Tmax 99.1->afebrile Leukocytosis WBC 11.9-9.9-10.3-> 13.4->15.6->17.5->17.9-> 15.9 UA WBC more than 50, completed Rocephin course CXR neg LE venous doppler no DVT  Other Stroke Risk Factors Advanced Age >/= 98   Other Active Problems AKI  Cr 0.92-1.04-1.14-1.03-1.13-1.02 Put on IV fluid, off tube feeding Continue IVF at 75->50cc/h COPD  Hospital day # [place] DNP, ACNPC-AG    ATTENDING NOTE: I reviewed above note and agree with the assessment and plan. Pt was seen and examined.   RN at bedside.  No family at the bedside.  Patient reclining in bed, still has right gaze preference, left hemiplegia, however neuro stable, eating well.  Off core track yesterday.  Kidney function and leukocytosis only improved.  Headache also improved after Tylenol scheduled dose.  Pending CIR versus SNF.  For detailed assessment and plan, please refer to above/below as I have made changes wherever appropriate.   Julia Hawking, MD PhD Stroke  Neurology 02/12/2022 7:15 PM

## 2022-02-13 LAB — CBC
HCT: 29.9 % — ABNORMAL LOW (ref 36.0–46.0)
Hemoglobin: 9.8 g/dL — ABNORMAL LOW (ref 12.0–15.0)
MCH: 31.3 pg (ref 26.0–34.0)
MCHC: 32.8 g/dL (ref 30.0–36.0)
MCV: 95.5 fL (ref 80.0–100.0)
Platelets: 268 10*3/uL (ref 150–400)
RBC: 3.13 MIL/uL — ABNORMAL LOW (ref 3.87–5.11)
RDW: 14.2 % (ref 11.5–15.5)
WBC: 13.9 10*3/uL — ABNORMAL HIGH (ref 4.0–10.5)
nRBC: 0 % (ref 0.0–0.2)

## 2022-02-13 LAB — BASIC METABOLIC PANEL
Anion gap: 8 (ref 5–15)
BUN: 23 mg/dL (ref 8–23)
CO2: 17 mmol/L — ABNORMAL LOW (ref 22–32)
Calcium: 8.6 mg/dL — ABNORMAL LOW (ref 8.9–10.3)
Chloride: 112 mmol/L — ABNORMAL HIGH (ref 98–111)
Creatinine, Ser: 1.06 mg/dL — ABNORMAL HIGH (ref 0.44–1.00)
GFR, Estimated: 55 mL/min — ABNORMAL LOW (ref >60)
Glucose, Bld: 111 mg/dL — ABNORMAL HIGH (ref 70–99)
Potassium: 3.9 mmol/L (ref 3.5–5.1)
Sodium: 137 mmol/L (ref 135–145)

## 2022-02-13 LAB — GLUCOSE, CAPILLARY
Glucose-Capillary: 119 mg/dL — ABNORMAL HIGH (ref 70–99)
Glucose-Capillary: 134 mg/dL — ABNORMAL HIGH (ref 70–99)
Glucose-Capillary: 100 mg/dL — ABNORMAL HIGH (ref 70–99)
Glucose-Capillary: 127 mg/dL — ABNORMAL HIGH (ref 70–99)

## 2022-02-13 MED ORDER — METOCLOPRAMIDE HCL 5 MG/ML IJ SOLN
10.0000 mg | Freq: Once | INTRAMUSCULAR | Status: AC
  Administered 2022-02-13: 10 mg via INTRAVENOUS
  Filled 2022-02-13: qty 2

## 2022-02-13 NOTE — Plan of Care (Signed)
  Problem: Education: Goal: Knowledge of disease or condition will improve Outcome: Progressing Goal: Knowledge of secondary prevention will improve (SELECT ALL) Outcome: Progressing Goal: Knowledge of patient specific risk factors will improve (INDIVIDUALIZE FOR PATIENT) Outcome: Progressing   Problem: Nutrition: Goal: Adequate nutrition will be maintained Outcome: Progressing   Problem: Pain Managment: Goal: General experience of comfort will improve Outcome: Progressing   Problem: Education: Goal: Knowledge of disease or condition will improve Outcome: Progressing Goal: Knowledge of secondary prevention will improve (SELECT ALL) Outcome: Progressing Goal: Knowledge of patient specific risk factors will improve (INDIVIDUALIZE FOR PATIENT) Outcome: Progressing   Problem: Nutrition: Goal: Adequate nutrition will be maintained Outcome: Progressing   Problem: Pain Managment: Goal: General experience of comfort will improve Outcome: Progressing

## 2022-02-13 NOTE — Progress Notes (Addendum)
STROKE TEAM PROGRESS NOTE   INTERVAL HISTORY Patient is asleep in the bed in NAD. No family at the bedside. Encourage patient to be up during the day, open blinds, lights on and TV on to help prevent delirium. VSS. No new neurological events overnight. Neurological exam unchanged. Still waiting on decision for CIR or SNF placement  Vitals:   02/13/22 0357 02/13/22 0500 02/13/22 0732 02/13/22 0754  BP: (!) 132/51  (!) 205/75 (!) 137/52  Pulse: 65  84 72  Resp: 17   18  Temp: 98.1 F (36.7 C)   97.9 F (36.6 C)  TempSrc: Oral     SpO2: 98%  100% 97%  Weight:  71.7 kg    Height:       CBC:  Recent Labs  Lab 02/11/22 0640 02/13/22 0400  WBC 15.9* 13.9*  HGB 9.7* 9.8*  HCT 28.6* 29.9*  MCV 94.4 95.5  PLT 248 174    Basic Metabolic Panel:  Recent Labs  Lab 02/11/22 0640 02/13/22 0400  NA 133* 137  K 3.3* 3.9  CL 106 112*  CO2 20* 17*  GLUCOSE 107* 111*  BUN 27* 23  CREATININE 1.02* 1.06*  CALCIUM 7.8* 8.6*     Physical Exam  Constitutional: Appears well-developed and well-nourished elderly Caucasian lady.   Cardiovascular: Normal rate and regular rhythm.  Respiratory: Effort normal, non-labored breathing  Neuro: Pt drowsy sleepy but easy to arouse, she still has right gaze and barely cross midline, orientated to age, place, and situation. No aphasia but paucity of speech, following all simple commands, no dysathria either. Able to name 2/3 and repeat simple sentences but slow in response. Tracking bilaterally on the right visual field, not blinking to visual threat on the left. Left facial droop. Tongue midline. LUE flaccid, LLE no spontaneous movement on request but withdraw to pain 3-/5. RUE 3/5 and RLE at least 4/5. Sensation, coordination not cooperative and gait not tested.  ASSESSMENT/PLAN [name] is a 76 y.o. female with history of arthritis, CKD, COPD, Fuch's corneal endothelial dystrophy, HLD, HTN and hypothyroidism who presents to the ED via EMS as a  Code Stroke with acute onset of left sided weakness, left facial droop and dysarthria with neglect. Initially symptoms improved in IR suite after TNK and she was taken back to her ED room. She then had a change in neurological exam and was reactivated as a Code IR.  Stroke: Right MCA infarct s/p TNK and IR with TICI2c, likely due to large vessel disease from R ICA high grade stenosis   Code Stroke CT head Loss of gray-white differentiation in the posterior right insular cortex and right lentiform nucleus compatible with acute/subacute right MCA infarct. Aspects 8/10 CTA head & neck Acute occlusion of the proximal right M1. Severe stenosis at the origin of the right ICA (>75%). MRI Acute/subacute small infarct involving the posterior right parietal cortex  MRA moderate stenosis right cavernous ICA, high-grade right M1 segment stenosis IR showed right M1 and ACA occlusion status post EVT with TICI3 and R MCA and proximal ICA stents.  Complicated by CC fistula s/p flow diverters and stable CT head 9/26 multifocal right hemisphere infarcts including right frontal gyrus, right BG, superior perirolandic cortex.  Trace IVH and SAH.  Right ICA siphon and MCA vascular stents. CT repeat 10/4 stable infarct, no evidence of hemorrhage or new infarct. 2D Echo EF 55-60% LDL 125 HgbA1c 5.4 VTE prophylaxis -heparin subcu No antithrombotic prior to admission, now on aspirin  81 mg daily and Brilinta (ticagrelor) 90 mg bid.  Therapy recommendations:  CIR vs. SNF Disposition:  Pending  [facility]  Procedure related complication Status post overlapping flow diverters Stable so far  May consider coiling in the future  Carotid stenosis CTA head neck showed right ICA > 75% stenosis Likely the cause of current stroke Status post ICA stenting On aspirin and Brilinta  Hx of hypertension hypotensive with bradycardia  Home meds:  Losartan '50mg'$  Likely related to carotid stenting Stable now Resumed losartan at '25mg'$  BP  goal less than 160 Long-term BP goal normotensive  Hyperlipidemia Home meds:  None LDL 125, goal < 70 Rosuvastatin 20 mg daily Continue statin at discharge  Headache Continue to complaining right sided HA CT repeat 10/4 stable Fioricet PRN and tylenol changed to scheduled dose Topamax '25mg'$ ->50->75->100 BID   Dysphagia DC Cortrak tube 10/5 SLP on board  On dysphagia 3 and honey thick liquid Continue IVF for now given AKI  Leukocytosis UTI 24 hour Tmax 99.5->afebrile Leukocytosis WBC 11.9-9.9-10.3-> 13.4->15.6->17.5->17.9-> 15.9->13.9 UA WBC more than 50, completed Rocephin course CXR neg LE venous doppler no DVT  Other Stroke Risk Factors Advanced Age >/= 15   Other Active Problems AKI  Cr 0.92-1.04-1.14-1.03-1.13-1.02-1.06 Put on IV fluid, off tube feeding Continue IVF at 75->50cc/hr  COPD  Hospital day # [place] DNP, ACNPC-AG   ATTENDING NOTE: I reviewed above note and agree with the assessment and plan. Pt was seen and examined.   No family at the bedside. Pt was sleeping but arousable, needed stimulation to keep awake. No acute event overnight, she ate breakfast OK. Still has left hemiplegia, left neglect and right gaze. Pending CIR vs. SNF. Continue ASA and brilinta. Leukocytosis continues to improve and Cre fluctuate. Continue IVF and encourage po intake.   For detailed assessment and plan, please refer to above/below as I have made changes wherever appropriate.   Rosalin Hawking, MD PhD Stroke Neurology 02/13/2022 4:03 PM

## 2022-02-14 LAB — GLUCOSE, CAPILLARY
Glucose-Capillary: 104 mg/dL — ABNORMAL HIGH (ref 70–99)
Glucose-Capillary: 109 mg/dL — ABNORMAL HIGH (ref 70–99)
Glucose-Capillary: 118 mg/dL — ABNORMAL HIGH (ref 70–99)
Glucose-Capillary: 107 mg/dL — ABNORMAL HIGH (ref 70–99)
Glucose-Capillary: 119 mg/dL — ABNORMAL HIGH (ref 70–99)

## 2022-02-14 MED ORDER — TAMSULOSIN HCL 0.4 MG PO CAPS
0.4000 mg | ORAL_CAPSULE | Freq: Every day | ORAL | Status: DC
  Administered 2022-02-14 – 2022-02-15 (×2): 0.4 mg via ORAL
  Filled 2022-02-14 (×2): qty 1

## 2022-02-14 NOTE — Progress Notes (Signed)
Physical Therapy Treatment Patient Details Name: Julia Deleon MRN: [id number] DOB: [birth year] Today's Date: 02/14/2022   History of Present Illness Patient is a 76 y/o female who presents on 9/22 with left sided weakness and slurred speech. Found to have Rt M1 occlusion s/p TNK and mechanical thrombectomy 9/22. Developed bradycardia with hypotension in ICU.  PMH includes COPD, back surgery, CKD.    PT Comments    Pt slowly progressing toward goals, limited by fairly dense L hemiplegia and L inattention.  Emphasis on warm up, rolling R>L, transitioning to EOB, sitting balance, upright sitting, inhibition of pushing, standing and transfer to the chair.   Recommendations for follow up therapy are one component of a multi-disciplinary discharge planning process, led by the attending physician.  Recommendations may be updated based on patient status, additional functional criteria and insurance authorization.  Follow Up Recommendations  Acute inpatient rehab (3hours/day)     Assistance Recommended at Discharge Frequent or constant Supervision/Assistance  Patient can return home with the following Two people to help with walking and/or transfers;A lot of help with bathing/dressing/bathroom;Assistance with cooking/housework;Direct supervision/assist for medications management;Assist for transportation;Help with stairs or ramp for entrance   Equipment Recommendations  Other (comment)    Recommendations for Other Services       Precautions / Restrictions Precautions Precautions: Fall Precaution Comments: L hemiparesis, L inattention     Mobility  Bed Mobility Overal bed mobility: Needs Assistance Bed Mobility: Rolling, Sidelying to Sit Rolling: Min assist, Max assist Sidelying to sit: Max assist       General bed mobility comments: emphasized sequencing from L to right rolling without rail and graded assist    Transfers Overall transfer level: Needs assistance   Transfers: Sit  to/from Stand, Bed to chair/wheelchair/BSC Sit to Stand: Max assist, +2 physical assistance Stand pivot transfers: Max assist, +2 physical assistance         General transfer comment: cues for hand placement, forward translation.  assist forward and with boost and support for the transfer, pt able to reach for armrest to help control pivot and descent.    Ambulation/Gait                   Stairs             Wheelchair Mobility    Modified Rankin (Stroke Patients Only) Modified Rankin (Stroke Patients Only) Pre-Morbid Rankin Score: No significant disability Modified Rankin: Severe disability     Balance Overall balance assessment: Needs assistance Sitting-balance support: Feet supported, Single extremity supported Sitting balance-Leahy Scale: Poor Sitting balance - Comments: work at [employer] for 6-7 min on balance, inhibiting R push, upright neutral tilt, "At attention."     Standing balance-Leahy Scale: Zero                              Cognition Arousal/Alertness: Awake/alert Behavior During Therapy: Flat affect Overall Cognitive Status: Impaired/Different from baseline (NT formally)                                 General Comments: can draw pt's attension left with mod to maximal cuing,        Exercises General Exercises - Lower Extremity Hip ABduction/ADduction: AAROM, PROM, Strengthening, Right, Left, 10 reps, Supine Other Exercises Other Exercises: bil hip/knee flex/ext ROM with graded Assist L and graded resistance R LE's x10  General Comments General comments (skin integrity, edema, etc.): VSS overall with a feet runs of tachy      Pertinent Vitals/Pain Pain Assessment Pain Assessment: Faces Faces Pain Scale: Hurts little more Pain Location: LUE with movement Pain Descriptors / Indicators: Guarding Pain Intervention(s): Monitored during session    Home Living                          Prior Function             PT Goals (current goals can now be found in the care plan section) Acute Rehab PT Goals Patient Stated Goal: none stated PT Goal Formulation: Patient unable to participate in goal setting Time For Goal Achievement: 02/28/22 Potential to Achieve Goals: Fair Progress towards PT goals: Progressing toward goals    Frequency    Min 4X/week      PT Plan Current plan remains appropriate    Co-evaluation              AM-PAC PT "6 Clicks" Mobility   Outcome Measure  Help needed turning from your back to your side while in a flat bed without using bedrails?: A Lot Help needed moving from lying on your back to sitting on the side of a flat bed without using bedrails?: A Lot Help needed moving to and from a bed to a chair (including a wheelchair)?: Total Help needed standing up from a chair using your arms (e.g., wheelchair or bedside chair)?: A Lot Help needed to walk in hospital room?: Total Help needed climbing 3-5 steps with a railing? : Total 6 Click Score: 9    End of Session   Activity Tolerance: Patient tolerated treatment well Patient left: in chair;with call bell/phone within reach;with chair alarm set;with family/visitor present Nurse Communication: Mobility status PT Visit Diagnosis: Hemiplegia and hemiparesis;Difficulty in walking, not elsewhere classified (R26.2);Unsteadiness on feet (R26.81) Hemiplegia - Right/Left: Left Hemiplegia - dominant/non-dominant: Non-dominant Hemiplegia - caused by: Cerebral infarction     Time: 6381-7711 PT Time Calculation (min) (ACUTE ONLY): 28 min  Charges:  $Therapeutic Activity: 8-22 mins $Neuromuscular Re-education: 8-22 mins                     02/14/2022  Ginger Carne., PT Acute Rehabilitation Services [phone]  (office)   Tessie Fass Gwynn Chalker 02/14/2022, 6:19 PM

## 2022-02-14 NOTE — Progress Notes (Addendum)
STROKE TEAM PROGRESS NOTE   INTERVAL HISTORY  Husband at the bedside. Per husband she is eating well. Patient is awake and alert  and oriented. She c/o throat pain, right ear pain and thinks she has UTI. Will d/c foley and evaluate urinary symptoms. Neurological exam is unchanged. VSS. Dr Leonie Man updated husband. Social work consult placed to assist with SNF placement  Vitals:   02/13/22 2041 02/13/22 2342 02/14/22 0348 02/14/22 0738  BP: (!) 141/50 (!) 122/56 (!) 141/57 (!) 128/58  Pulse: 71 (!) 59 66 62  Resp: '19 17 19 14  '$ Temp: (!) 97.5 F (36.4 C) 98.2 F (36.8 C) 99.1 F (37.3 C) 97.7 F (36.5 C)  TempSrc: Oral Oral Oral Oral  SpO2: 100% 99% 99% 100%  Weight:      Height:       CBC:  Recent Labs  Lab 02/11/22 0640 02/13/22 0400  WBC 15.9* 13.9*  HGB 9.7* 9.8*  HCT 28.6* 29.9*  MCV 94.4 95.5  PLT 248 347    Basic Metabolic Panel:  Recent Labs  Lab 02/11/22 0640 02/13/22 0400  NA 133* 137  K 3.3* 3.9  CL 106 112*  CO2 20* 17*  GLUCOSE 107* 111*  BUN 27* 23  CREATININE 1.02* 1.06*  CALCIUM 7.8* 8.6*     Physical Exam  Constitutional: Appears well-developed and well-nourished elderly Caucasian lady.   Cardiovascular: Normal rate and regular rhythm.  Respiratory: Effort normal, non-labored breathing  Neuro: Pt drowsy sleepy but easy to arouse, she still has right gaze and barely cross midline, orientated to age, place, and situation. No aphasia but paucity of speech, following all simple commands, no dysathria either. Tracking bilaterally on the right visual field, not blinking to visual threat on the left. Left facial droop. Tongue midline. LUE flaccid, LLE no spontaneous movement on request but withdraw to pain 3-/5. RUE 3/5 and RLE at least 4/5. Sensation, coordination not cooperative and gait not tested.  ASSESSMENT/PLAN Julia Deleon is a 76 y.o. female with history of arthritis, CKD, COPD, Fuch's corneal endothelial dystrophy, HLD, HTN and  hypothyroidism who presents to the ED via EMS as a Code Stroke with acute onset of left sided weakness, left facial droop and dysarthria with neglect. Initially symptoms improved in IR suite after TNK and she was taken back to her ED room. She then had a change in neurological exam and was reactivated as a Code IR.  Stroke: Right MCA infarct s/p TNK and IR with TICI2c, likely due to large vessel disease from R ICA high grade stenosis   Code Stroke CT head Loss of gray-white differentiation in the posterior right insular cortex and right lentiform nucleus compatible with acute/subacute right MCA infarct. Aspects 8/10 CTA head & neck Acute occlusion of the proximal right M1. Severe stenosis at the origin of the right ICA (>75%). MRI Acute/subacute small infarct involving the posterior right parietal cortex  MRA moderate stenosis right cavernous ICA, high-grade right M1 segment stenosis IR showed right M1 and ACA occlusion status post EVT with TICI3 and R MCA and proximal ICA stents.  Complicated by CC fistula s/p flow diverters and stable CT head 9/26 multifocal right hemisphere infarcts including right frontal gyrus, right BG, superior perirolandic cortex.  Trace IVH and SAH.  Right ICA siphon and MCA vascular stents. CT repeat 10/4 stable infarct, no evidence of hemorrhage or new infarct. 2D Echo EF 55-60% LDL 125 HgbA1c 5.4 VTE prophylaxis -heparin subcu No antithrombotic prior to admission, now  on aspirin 81 mg daily and Brilinta (ticagrelor) 90 mg bid.  Therapy recommendations:  CIR vs. SNF Disposition:  Pending  [facility]  Procedure related complication Status post overlapping flow diverters Stable so far  May consider coiling in the future  Carotid stenosis CTA head neck showed right ICA > 75% stenosis Likely the cause of current stroke Status post ICA stenting On aspirin and Brilinta  Hx of hypertension hypotensive with bradycardia  Home meds:  Losartan '50mg'$  Likely related to carotid  stenting Stable now Resumed losartan at '25mg'$  BP goal less than 160 Long-term BP goal normotensive  Hyperlipidemia Home meds:  None LDL 125, goal < 70 Rosuvastatin 20 mg daily Continue statin at discharge  Headache Continue to complaining right sided HA CT repeat 10/4 stable Fioricet PRN and tylenol changed to scheduled dose Topamax '25mg'$ ->50->75->100 BID   Dysphagia DC Cortrak tube 10/5 SLP on board  On dysphagia 3 and honey thick liquid Continue IVF for now given AKI  Leukocytosis UTI 24 hour Tmax 99.1->afebrile Leukocytosis WBC 11.9-9.9-10.3-> 13.4->15.6->17.5->17.9-> 15.9->13.9 UA WBC more than 50, completed Rocephin course CXR neg LE venous doppler no DVT  Other Stroke Risk Factors Advanced Age >/= 16   Other Active Problems AKI  Cr 0.92-1.04-1.14-1.03-1.13-1.02-1.06 Put on IV fluid, off tube feeding Continue IVF at 75->50cc/hr  COPD  Hospital day # [place] DNP, ACNPC-AG   I have personally obtained history,examined this patient, reviewed notes, independently viewed imaging studies, participated in medical decision making and plan of care.ROS completed by me personally and pertinent positives fully documented  I have made any additions or clarifications directly to the above note. Agree with note above.  Patient continues to have dense left hemiplegia and rehab coordinator feels patient may not recover enough and 3 to 4 weeks of inpatient rehab stay to go home and recommend longer stay in rehab and skilled nursing facility.  But is agreeable and will consult social worker for SNF rehab.  Greater than 50% time during this 35-minute visit was spent on counseling and coordination of care about her stroke and rehab needs and discussion with patient, husband and care team and answering questions Antony Contras, MD Medical Director [place] Pager: [phone] 02/14/2022 2:59 PM

## 2022-02-14 NOTE — Plan of Care (Signed)
  Problem: Education: Goal: Knowledge of disease or condition will improve Outcome: Progressing Goal: Knowledge of secondary prevention will improve (SELECT ALL) Outcome: Progressing Goal: Knowledge of patient specific risk factors will improve (INDIVIDUALIZE FOR PATIENT) Outcome: Progressing   Problem: Nutrition: Goal: Adequate nutrition will be maintained Outcome: Progressing

## 2022-02-15 LAB — GLUCOSE, CAPILLARY
Glucose-Capillary: 110 mg/dL — ABNORMAL HIGH (ref 70–99)
Glucose-Capillary: 114 mg/dL — ABNORMAL HIGH (ref 70–99)

## 2022-02-15 MED ORDER — INFLUENZA VAC A&B SA ADJ QUAD 0.5 ML IM PRSY: 0.5000 mL | PREFILLED_SYRINGE | INTRAMUSCULAR | Status: DC

## 2022-02-15 MED ORDER — AMANTADINE HCL 100 MG PO CAPS
200.0000 mg | ORAL_CAPSULE | Freq: Two times a day (BID) | ORAL | Status: DC
  Administered 2022-02-15 – 2022-02-16 (×2): 200 mg via ORAL
  Filled 2022-02-15 (×3): qty 2

## 2022-02-15 NOTE — Progress Notes (Signed)
Physical Therapy Treatment Patient Details Name: Julia Deleon MRN: [id number] DOB: 13-Dec-1945 Today's Date: 02/15/2022   History of Present Illness Patient is a 76 y/o female who presents on 9/22 with left sided weakness and slurred speech. Found to have Rt M1 occlusion s/p TNK and mechanical thrombectomy 9/22. Developed bradycardia with hypotension in ICU.  PMH includes COPD, back surgery, CKD.    PT Comments    Pt continues with dense L hemiparesis, impaired balance, and L inattention. Pt with depressed spirits with report of "nothing works right. My anatomy is all screwed up." Pt also c/o back and R shld pain. Focused on sitting EOB balance as pt with strong L lateral lean. Pt continues to required maxAx2 for OOB mobility. Continue to recommend AIR upon d/c. Acute PT to cont to follow.    Recommendations for follow up therapy are one component of a multi-disciplinary discharge planning process, led by the attending physician.  Recommendations may be updated based on patient status, additional functional criteria and insurance authorization.  Follow Up Recommendations  Acute inpatient rehab (3hours/day)     Assistance Recommended at Discharge Frequent or constant Supervision/Assistance  Patient can return home with the following Two people to help with walking and/or transfers;A lot of help with bathing/dressing/bathroom;Assistance with cooking/housework;Direct supervision/assist for medications management;Assist for transportation;Help with stairs or ramp for entrance   Equipment Recommendations  Other (comment)    Recommendations for Other Services       Precautions / Restrictions Precautions Precautions: Fall Precaution Comments: L hemiparesis, L inattention Restrictions Weight Bearing Restrictions: No     Mobility  Bed Mobility Overal bed mobility: Needs Assistance Bed Mobility: Rolling, Sidelying to Sit Rolling: Max assist Sidelying to sit: Max assist       General  bed mobility comments: rolling to the R requires max assist due to inability to use L UE and LE functionally to assist, maxAx2 for trunk elevation    Transfers Overall transfer level: Needs assistance Equipment used: 2 person hand held assist (face to face transfer with use of bed pad) Transfers: Sit to/from Stand, Bed to chair/wheelchair/BSC Sit to Stand: Max assist, +2 physical assistance (x2 trials) Stand pivot transfers: Max assist, +2 physical assistance         General transfer comment: max directional verbal cues, L knee blocked, pt with urinary incontinence upon first stand. pt cleaned up and socks changed prior to second stand and std pvt to chair, pt unable to advance R LE despite max verbal and tactile cues    Ambulation/Gait               General Gait Details: unable   Stairs             Wheelchair Mobility    Modified Rankin (Stroke Patients Only) Modified Rankin (Stroke Patients Only) Pre-Morbid Rankin Score: No significant disability Modified Rankin: Severe disability     Balance Overall balance assessment: Needs assistance Sitting-balance support: Feet supported, Single extremity supported Sitting balance-Leahy Scale: Poor Sitting balance - Comments: work at [employer] for 6-7 min on balance, inhibiting R push, pt able to hold onto bed rail with R UE and maintain midline posture however despite max verbal/directional cues pt unable to maintain midline without R UE support or external support if not using R UE Postural control: Posterior lean, Left lateral lean Standing balance support: Bilateral upper extremity supported Standing balance-Leahy Scale: Zero Standing balance comment: dependent on external assist  Cognition Arousal/Alertness: Awake/alert Behavior During Therapy: Flat affect Overall Cognitive Status: Impaired/Different from baseline Area of Impairment: Memory, Following commands, Safety/judgement,  Problem solving                   Current Attention Level: Focused Memory: Decreased short-term memory Following Commands: Follows one step commands with increased time Safety/Judgement: Decreased awareness of safety, Decreased awareness of deficits   Problem Solving: Slow processing, Decreased initiation, Difficulty sequencing, Requires verbal cues, Requires tactile cues General Comments: can draw pt's attension left with mod to maximal cuing, pt with depressed        Exercises      General Comments General comments (skin integrity, edema, etc.): VSS      Pertinent Vitals/Pain Pain Assessment Pain Assessment: Faces Faces Pain Scale: Hurts little more Pain Location: back, pt with heat packs on Pain Descriptors / Indicators: Dull Pain Intervention(s): Monitored during session    Home Living                          Prior Function            PT Goals (current goals can now be found in the care plan section) Acute Rehab PT Goals Patient Stated Goal: none stated PT Goal Formulation: Patient unable to participate in goal setting Time For Goal Achievement: 02/28/22 Potential to Achieve Goals: Fair Progress towards PT goals: Progressing toward goals    Frequency    Min 4X/week      PT Plan Current plan remains appropriate    Co-evaluation              AM-PAC PT "6 Clicks" Mobility   Outcome Measure  Help needed turning from your back to your side while in a flat bed without using bedrails?: A Lot Help needed moving from lying on your back to sitting on the side of a flat bed without using bedrails?: A Lot Help needed moving to and from a bed to a chair (including a wheelchair)?: Total Help needed standing up from a chair using your arms (e.g., wheelchair or bedside chair)?: A Lot Help needed to walk in hospital room?: Total Help needed climbing 3-5 steps with a railing? : Total 6 Click Score: 9    End of Session Equipment Utilized  During Treatment: Gait belt Activity Tolerance: Patient tolerated treatment well Patient left: in chair;with call bell/phone within reach;with chair alarm set;with family/visitor present Nurse Communication: Mobility status PT Visit Diagnosis: Hemiplegia and hemiparesis;Difficulty in walking, not elsewhere classified (R26.2);Unsteadiness on feet (R26.81) Hemiplegia - Right/Left: Left Hemiplegia - dominant/non-dominant: Non-dominant Hemiplegia - caused by: Cerebral infarction     Time: 1157-1229 PT Time Calculation (min) (ACUTE ONLY): 32 min  Charges:  $Therapeutic Activity: 8-22 mins $Neuromuscular Re-education: 8-22 mins                     Kittie Plater, PT, DPT Acute Rehabilitation Services Secure chat preferred Office #: [phone]    [place]line Lopes 02/15/2022, 1:36 PM

## 2022-02-15 NOTE — Progress Notes (Signed)
Inpatient Rehab Admissions Coordinator:    I do not have a CIR bed for this Pt. Today, but rehab MD did feel she was an appropriate CIR candidate. Will follow for potential admit tomorrow. Discussed with husband and he's in agreement.   Clemens Catholic, [place], [place] Admissions Coordinator  [phone] ([place]) [phone] (office)

## 2022-02-15 NOTE — Progress Notes (Signed)
STROKE TEAM PROGRESS NOTE   INTERVAL HISTORY Patient is lying comfortably in bed She has no complaints today. She can history of urinary retention and had to have intermittent catheterization several times last night  Vitals:   02/14/22 1638 02/14/22 1940 02/14/22 2321 02/15/22 0323  BP: 133/61 (!) 138/55 (!) 151/82 129/62  Pulse: 62 70 71 66  Resp: '18 17 17 17  '$ Temp: 98 F (36.7 C) 98.4 F (36.9 C) 98.6 F (37 C) 98 F (36.7 C)  TempSrc: Oral Oral Oral Oral  SpO2: 98% 99% 100% 99%  Weight:      Height:       CBC:  Recent Labs  Lab 02/11/22 0640 02/13/22 0400  WBC 15.9* 13.9*  HGB 9.7* 9.8*  HCT 28.6* 29.9*  MCV 94.4 95.5  PLT 248 671   Basic Metabolic Panel:  Recent Labs  Lab 02/11/22 0640 02/13/22 0400  NA 133* 137  K 3.3* 3.9  CL 106 112*  CO2 20* 17*  GLUCOSE 107* 111*  BUN 27* 23  CREATININE 1.02* 1.06*  CALCIUM 7.8* 8.6*    Physical Exam  Constitutional: Appears well-developed and well-nourished elderly Caucasian lady.   Cardiovascular: Normal rate and regular rhythm.  Respiratory: Effort normal, non-labored breathing  Neuro: Pt drowsy sleepy but easy to arouse, she still has right gaze and barely cross midline, orientated to age, place, and situation. No aphasia but paucity of speech, following all simple commands, no dysathria either. Tracking bilaterally on the right visual field, not blinking to visual threat on the left. Left facial droop. Tongue midline. LUE flaccid, LLE no spontaneous movement on request but withdraw to pain 3-/5. RUE 3/5 and RLE at least 4/5. Sensation, coordination not cooperative and gait not tested.  ASSESSMENT/PLAN Ms. Thyra C Brow is a 76 y.o. female with history of arthritis, CKD, COPD, Fuch's corneal endothelial dystrophy, HLD, HTN and hypothyroidism who presents to the ED via EMS as a Code Stroke with acute onset of left sided weakness, left facial droop and dysarthria with neglect. Initially symptoms improved in IR suite  after TNK and she was taken back to her ED room. She then had a change in neurological exam and was reactivated as a Code IR.  Stroke: Right MCA infarct s/p TNK and IR with TICI2c, likely due to large vessel disease from R ICA high grade stenosis   Code Stroke CT head Loss of gray-white differentiation in the posterior right insular cortex and right lentiform nucleus compatible with acute/subacute right MCA infarct. Aspects 8/10 CTA head & neck Acute occlusion of the proximal right M1. Severe stenosis at the origin of the right ICA (>75%). MRI Acute/subacute small infarct involving the posterior right parietal cortex  MRA moderate stenosis right cavernous ICA, high-grade right M1 segment stenosis IR showed right M1 and ACA occlusion status post EVT with TICI3 and R MCA and proximal ICA stents.  Complicated by CC fistula s/p flow diverters and stable CT head 9/26 multifocal right hemisphere infarcts including right frontal gyrus, right BG, superior perirolandic cortex.  Trace IVH and SAH.  Right ICA siphon and MCA vascular stents. CT repeat 10/4 stable infarct, no evidence of hemorrhage or new infarct. 2D Echo EF 55-60% LDL 125 HgbA1c 5.4 VTE prophylaxis -heparin subcu No antithrombotic prior to admission, now on aspirin 81 mg daily and Brilinta (ticagrelor) 90 mg bid.  Therapy recommendations:  CIR vs. SNF Disposition:  Pending  [facility]  Procedure related complication Status post overlapping flow diverters Stable so far  May consider coiling in the future  Carotid stenosis CTA head neck showed right ICA > 75% stenosis Likely the cause of current stroke Status post ICA stenting On aspirin and Brilinta  Hx of hypertension hypotensive with bradycardia  Home meds:  Losartan '50mg'$  Likely related to carotid stenting Stable now Resumed losartan at '25mg'$  BP goal less than 160 Long-term BP goal normotensive  Hyperlipidemia Home meds:  None LDL 125, goal < 70 Rosuvastatin 20 mg  daily Continue statin at discharge  Headache Continue to complaining right sided H CT repeat 10/4 stable Fioricet PRN and tylenol changed to scheduled dose Topamax '25mg'$ ->50->75->100 BID   Dysphagia DC Cortrak tube 10/5 SLP on board  On dysphagia 3 and honey thick liquid Continue IVF for now given AKI  Nepro Carb Steady - 237 ml twice daily between meals. Inadequate oral intake related to dysphagia as evidenced by routinely consuming <75% of meal - supplementation recommended - per Ander Slade, Registered Dietitian  Leukocytosis UTI 24 hour Tmax 99.1->afebrile Leukocytosis WBC 11.9-9.9-10.3-> 13.4->15.6->17.5->17.9-> 15.9->13.9 UA WBC more than 50, completed Rocephin course CXR neg LE venous doppler no DVT  Other Stroke Risk Factors Advanced Age >/= 50   Other Active Problems AKI  Cr 0.92-1.04-1.14-1.03-1.13-1.02-1.06 Put on IV fluid, off tube feeding Continue IVF at 75->50cc/hr  COPD Check CBC and Bmet in AM 02/16/2022. Pt may need Covid screen for possible SNF Rehab - await SW consult and plan. Pt still has PICC line placed 01/30/22 - Discontinue?  Hospital day # 17    Patient continues to have urinary retention hence will replace Foley catheter.  Patient has a bed in inpatient rehab today and is medically stable to be transferred with.  Long discussion with patient and husband at the bedside and answered questions.    Antony Contras, MD

## 2022-02-16 ENCOUNTER — Encounter (HOSPITAL_COMMUNITY): Payer: Self-pay | Admitting: Physical Medicine & Rehabilitation

## 2022-02-16 ENCOUNTER — Inpatient Hospital Stay (HOSPITAL_COMMUNITY)
Admission: RE | Admit: 2022-02-16 | Discharge: 2022-03-04 | DRG: 057 | Disposition: A | Discharge status: Skilled Nursing Facility | Payer: Medicare Other | Source: Intra-hospital | Attending: Physical Medicine & Rehabilitation | Admitting: Physical Medicine & Rehabilitation

## 2022-02-16 DIAGNOSIS — Z7902 Long term (current) use of antithrombotics/antiplatelets: Secondary | ICD-10-CM

## 2022-02-16 DIAGNOSIS — M4316 Spondylolisthesis, lumbar region: Secondary | ICD-10-CM | POA: Diagnosis present

## 2022-02-16 DIAGNOSIS — I2510 Atherosclerotic heart disease of native coronary artery without angina pectoris: Secondary | ICD-10-CM | POA: Diagnosis present

## 2022-02-16 DIAGNOSIS — Z808 Family history of malignant neoplasm of other organs or systems: Secondary | ICD-10-CM

## 2022-02-16 DIAGNOSIS — I63511 Cerebral infarction due to unspecified occlusion or stenosis of right middle cerebral artery: Secondary | ICD-10-CM | POA: Diagnosis not present

## 2022-02-16 DIAGNOSIS — I69391 Dysphagia following cerebral infarction: Secondary | ICD-10-CM | POA: Diagnosis not present

## 2022-02-16 DIAGNOSIS — J449 Chronic obstructive pulmonary disease, unspecified: Secondary | ICD-10-CM | POA: Diagnosis present

## 2022-02-16 DIAGNOSIS — I10 Essential (primary) hypertension: Secondary | ICD-10-CM | POA: Diagnosis not present

## 2022-02-16 DIAGNOSIS — E039 Hypothyroidism, unspecified: Secondary | ICD-10-CM | POA: Diagnosis present

## 2022-02-16 DIAGNOSIS — R519 Headache, unspecified: Secondary | ICD-10-CM | POA: Diagnosis not present

## 2022-02-16 DIAGNOSIS — Z803 Family history of malignant neoplasm of breast: Secondary | ICD-10-CM

## 2022-02-16 DIAGNOSIS — Z20822 Contact with and (suspected) exposure to covid-19: Secondary | ICD-10-CM | POA: Diagnosis not present

## 2022-02-16 DIAGNOSIS — I69392 Facial weakness following cerebral infarction: Secondary | ICD-10-CM | POA: Diagnosis not present

## 2022-02-16 DIAGNOSIS — E785 Hyperlipidemia, unspecified: Secondary | ICD-10-CM | POA: Diagnosis present

## 2022-02-16 DIAGNOSIS — B952 Enterococcus as the cause of diseases classified elsewhere: Secondary | ICD-10-CM | POA: Diagnosis not present

## 2022-02-16 DIAGNOSIS — G8929 Other chronic pain: Secondary | ICD-10-CM | POA: Diagnosis present

## 2022-02-16 DIAGNOSIS — N189 Chronic kidney disease, unspecified: Secondary | ICD-10-CM | POA: Diagnosis present

## 2022-02-16 DIAGNOSIS — J029 Acute pharyngitis, unspecified: Secondary | ICD-10-CM | POA: Diagnosis not present

## 2022-02-16 DIAGNOSIS — R338 Other retention of urine: Secondary | ICD-10-CM | POA: Diagnosis present

## 2022-02-16 DIAGNOSIS — Z825 Family history of asthma and other chronic lower respiratory diseases: Secondary | ICD-10-CM

## 2022-02-16 DIAGNOSIS — K219 Gastro-esophageal reflux disease without esophagitis: Secondary | ICD-10-CM | POA: Diagnosis present

## 2022-02-16 DIAGNOSIS — R339 Retention of urine, unspecified: Secondary | ICD-10-CM | POA: Diagnosis present

## 2022-02-16 DIAGNOSIS — Z8249 Family history of ischemic heart disease and other diseases of the circulatory system: Secondary | ICD-10-CM

## 2022-02-16 DIAGNOSIS — Z881 Allergy status to other antibiotic agents status: Secondary | ICD-10-CM

## 2022-02-16 DIAGNOSIS — R1312 Dysphagia, oropharyngeal phase: Secondary | ICD-10-CM | POA: Diagnosis present

## 2022-02-16 DIAGNOSIS — Z91040 Latex allergy status: Secondary | ICD-10-CM

## 2022-02-16 DIAGNOSIS — Z9049 Acquired absence of other specified parts of digestive tract: Secondary | ICD-10-CM | POA: Diagnosis not present

## 2022-02-16 DIAGNOSIS — M25512 Pain in left shoulder: Secondary | ICD-10-CM | POA: Diagnosis not present

## 2022-02-16 DIAGNOSIS — R1310 Dysphagia, unspecified: Secondary | ICD-10-CM | POA: Diagnosis present

## 2022-02-16 DIAGNOSIS — N390 Urinary tract infection, site not specified: Secondary | ICD-10-CM | POA: Diagnosis not present

## 2022-02-16 DIAGNOSIS — N319 Neuromuscular dysfunction of bladder, unspecified: Secondary | ICD-10-CM | POA: Diagnosis present

## 2022-02-16 DIAGNOSIS — I129 Hypertensive chronic kidney disease with stage 1 through stage 4 chronic kidney disease, or unspecified chronic kidney disease: Secondary | ICD-10-CM | POA: Diagnosis present

## 2022-02-16 DIAGNOSIS — N179 Acute kidney failure, unspecified: Secondary | ICD-10-CM | POA: Diagnosis present

## 2022-02-16 DIAGNOSIS — Z823 Family history of stroke: Secondary | ICD-10-CM | POA: Diagnosis not present

## 2022-02-16 DIAGNOSIS — M545 Low back pain, unspecified: Secondary | ICD-10-CM | POA: Diagnosis present

## 2022-02-16 DIAGNOSIS — Z888 Allergy status to other drugs, medicaments and biological substances status: Secondary | ICD-10-CM

## 2022-02-16 DIAGNOSIS — Z880 Allergy status to penicillin: Secondary | ICD-10-CM

## 2022-02-16 DIAGNOSIS — Z955 Presence of coronary angioplasty implant and graft: Secondary | ICD-10-CM

## 2022-02-16 DIAGNOSIS — Z87440 Personal history of urinary (tract) infections: Secondary | ICD-10-CM

## 2022-02-16 DIAGNOSIS — R159 Full incontinence of feces: Secondary | ICD-10-CM | POA: Diagnosis not present

## 2022-02-16 DIAGNOSIS — N952 Postmenopausal atrophic vaginitis: Secondary | ICD-10-CM | POA: Diagnosis present

## 2022-02-16 DIAGNOSIS — I69354 Hemiplegia and hemiparesis following cerebral infarction affecting left non-dominant side: Secondary | ICD-10-CM | POA: Diagnosis present

## 2022-02-16 DIAGNOSIS — J441 Chronic obstructive pulmonary disease with (acute) exacerbation: Secondary | ICD-10-CM

## 2022-02-16 DIAGNOSIS — Z79899 Other long term (current) drug therapy: Secondary | ICD-10-CM

## 2022-02-16 LAB — BASIC METABOLIC PANEL
Anion gap: 10 (ref 5–15)
BUN: 25 mg/dL — ABNORMAL HIGH (ref 8–23)
CO2: 17 mmol/L — ABNORMAL LOW (ref 22–32)
Calcium: 9 mg/dL (ref 8.9–10.3)
Chloride: 108 mmol/L (ref 98–111)
Creatinine, Ser: 1 mg/dL (ref 0.44–1.00)
GFR, Estimated: 59 mL/min — ABNORMAL LOW (ref >60)
Glucose, Bld: 114 mg/dL — ABNORMAL HIGH (ref 70–99)
Potassium: 3.7 mmol/L (ref 3.5–5.1)
Sodium: 135 mmol/L (ref 135–145)

## 2022-02-16 LAB — CBC
HCT: 30.3 % — ABNORMAL LOW (ref 36.0–46.0)
Hemoglobin: 10.3 g/dL — ABNORMAL LOW (ref 12.0–15.0)
MCH: 32.2 pg (ref 26.0–34.0)
MCHC: 34 g/dL (ref 30.0–36.0)
MCV: 94.7 fL (ref 80.0–100.0)
Platelets: 294 10*3/uL (ref 150–400)
RBC: 3.2 MIL/uL — ABNORMAL LOW (ref 3.87–5.11)
RDW: 14.6 % (ref 11.5–15.5)
WBC: 12.1 10*3/uL — ABNORMAL HIGH (ref 4.0–10.5)
nRBC: 0 % (ref 0.0–0.2)

## 2022-02-16 LAB — GLUCOSE, CAPILLARY
Glucose-Capillary: 170 mg/dL — ABNORMAL HIGH (ref 70–99)
Glucose-Capillary: 118 mg/dL — ABNORMAL HIGH (ref 70–99)
Glucose-Capillary: 118 mg/dL — ABNORMAL HIGH (ref 70–99)

## 2022-02-16 MED ORDER — ACETAMINOPHEN 325 MG PO TABS
325.0000 mg | ORAL_TABLET | ORAL | Status: DC | PRN
  Administered 2022-02-16 – 2022-02-20 (×8): 650 mg via ORAL
  Filled 2022-02-16 (×9): qty 2

## 2022-02-16 MED ORDER — PROCHLORPERAZINE 25 MG RE SUPP: 12.5000 mg | Freq: Four times a day (QID) | RECTAL | Status: DC | PRN

## 2022-02-16 MED ORDER — ALUM & MAG HYDROXIDE-SIMETH 200-200-20 MG/5ML PO SUSP: 30.0000 mL | ORAL | Status: DC | PRN

## 2022-02-16 MED ORDER — BISACODYL 10 MG RE SUPP: 10.0000 mg | Freq: Every day | RECTAL | Status: DC | PRN

## 2022-02-16 MED ORDER — LEVOTHYROXINE SODIUM 75 MCG PO TABS: 75.0000 ug | ORAL_TABLET | Freq: Every day | ORAL | 0 refills | Status: AC

## 2022-02-16 MED ORDER — TAMSULOSIN HCL 0.4 MG PO CAPS
0.4000 mg | ORAL_CAPSULE | Freq: Every day | ORAL | Status: DC
  Administered 2022-02-17 – 2022-03-04 (×16): 0.4 mg via ORAL
  Filled 2022-02-16 (×16): qty 1

## 2022-02-16 MED ORDER — FOOD THICKENER (SIMPLYTHICK): 1.0000 | ORAL | Status: DC | PRN

## 2022-02-16 MED ORDER — TOPIRAMATE 25 MG PO TABS
100.0000 mg | ORAL_TABLET | Freq: Two times a day (BID) | ORAL | Status: DC
  Administered 2022-02-16 – 2022-03-02 (×29): 100 mg via ORAL
  Filled 2022-02-16 (×29): qty 4

## 2022-02-16 MED ORDER — ASPIRIN 81 MG PO CHEW
81.0000 mg | CHEWABLE_TABLET | Freq: Every day | ORAL | Status: DC
  Administered 2022-02-17 – 2022-03-04 (×16): 81 mg via ORAL
  Filled 2022-02-16 (×16): qty 1

## 2022-02-16 MED ORDER — ASPIRIN 81 MG PO CHEW: 81.0000 mg | CHEWABLE_TABLET | Freq: Every day | ORAL | 0 refills | Status: AC

## 2022-02-16 MED ORDER — GUAIFENESIN-DM 100-10 MG/5ML PO SYRP: 5.0000 mL | ORAL_SOLUTION | Freq: Four times a day (QID) | ORAL | Status: DC | PRN

## 2022-02-16 MED ORDER — ROSUVASTATIN CALCIUM 20 MG PO TABS
20.0000 mg | ORAL_TABLET | Freq: Every day | ORAL | Status: DC
  Administered 2022-02-17 – 2022-03-04 (×16): 20 mg via ORAL
  Filled 2022-02-16 (×16): qty 1

## 2022-02-16 MED ORDER — ENOXAPARIN SODIUM 40 MG/0.4ML IJ SOSY
40.0000 mg | PREFILLED_SYRINGE | INTRAMUSCULAR | Status: DC
  Administered 2022-02-16 – 2022-03-04 (×17): 40 mg via SUBCUTANEOUS
  Filled 2022-02-16 (×17): qty 0.4

## 2022-02-16 MED ORDER — BUTALBITAL-APAP-CAFFEINE 50-325-40 MG PO TABS
1.0000 | ORAL_TABLET | Freq: Two times a day (BID) | ORAL | Status: DC | PRN
  Administered 2022-02-16 – 2022-03-04 (×7): 1 via ORAL
  Filled 2022-02-16 (×7): qty 1

## 2022-02-16 MED ORDER — ROSUVASTATIN CALCIUM 20 MG PO TABS: 20.0000 mg | ORAL_TABLET | Freq: Every day | ORAL | 0 refills | Status: DC

## 2022-02-16 MED ORDER — PROCHLORPERAZINE MALEATE 5 MG PO TABS: 5.0000 mg | ORAL_TABLET | Freq: Four times a day (QID) | ORAL | Status: DC | PRN

## 2022-02-16 MED ORDER — ORAL CARE MOUTH RINSE
15.0000 mL | OROMUCOSAL | Status: DC
  Administered 2022-02-16 – 2022-03-04 (×58): 15 mL via OROMUCOSAL

## 2022-02-16 MED ORDER — PANTOPRAZOLE 2 MG/ML SUSPENSION
40.0000 mg | Freq: Every day | ORAL | Status: DC
  Administered 2022-02-17 – 2022-02-20 (×3): 40 mg via ORAL
  Filled 2022-02-16 (×3): qty 20

## 2022-02-16 MED ORDER — TICAGRELOR 90 MG PO TABS: 90.0000 mg | ORAL_TABLET | Freq: Two times a day (BID) | ORAL | 5 refills | Status: DC

## 2022-02-16 MED ORDER — PANTOPRAZOLE SODIUM 40 MG PO PACK: 40.0000 mg | PACK | Freq: Every day | ORAL | 0 refills | Status: DC

## 2022-02-16 MED ORDER — LEVOTHYROXINE SODIUM 75 MCG PO TABS
75.0000 ug | ORAL_TABLET | Freq: Every day | ORAL | Status: DC
  Administered 2022-02-17 – 2022-03-04 (×16): 75 ug via ORAL
  Filled 2022-02-16 (×16): qty 1

## 2022-02-16 MED ORDER — NEPRO/CARBSTEADY PO LIQD
237.0000 mL | Freq: Two times a day (BID) | ORAL | Status: DC
  Administered 2022-02-18 – 2022-03-04 (×10): 237 mL via ORAL

## 2022-02-16 MED ORDER — DIPHENHYDRAMINE HCL 12.5 MG/5ML PO ELIX: 12.5000 mg | ORAL_SOLUTION | Freq: Four times a day (QID) | ORAL | Status: DC | PRN

## 2022-02-16 MED ORDER — ESTRADIOL 0.1 MG/GM VA CREA: 0.5000 g | TOPICAL_CREAM | VAGINAL | Status: DC | PRN

## 2022-02-16 MED ORDER — TAMSULOSIN HCL 0.4 MG PO CAPS: 0.4000 mg | ORAL_CAPSULE | Freq: Every day | ORAL | 0 refills | Status: AC

## 2022-02-16 MED ORDER — LOSARTAN POTASSIUM 25 MG PO TABS: 25.0000 mg | ORAL_TABLET | Freq: Every day | ORAL | 0 refills | Status: AC

## 2022-02-16 MED ORDER — HEPARIN SODIUM (PORCINE) 5000 UNIT/ML IJ SOLN: 5000.0000 [IU] | Freq: Three times a day (TID) | INTRAMUSCULAR | 0 refills | Status: DC

## 2022-02-16 MED ORDER — ALBUTEROL SULFATE (2.5 MG/3ML) 0.083% IN NEBU: 2.5000 mg | INHALATION_SOLUTION | Freq: Four times a day (QID) | RESPIRATORY_TRACT | 12 refills | Status: DC | PRN

## 2022-02-16 MED ORDER — PANTOPRAZOLE 2 MG/ML SUSPENSION
40.0000 mg | Freq: Every day | ORAL | Status: DC
  Administered 2022-02-16: 40 mg via ORAL

## 2022-02-16 MED ORDER — TRAZODONE HCL 50 MG PO TABS
25.0000 mg | ORAL_TABLET | Freq: Every evening | ORAL | Status: DC | PRN
  Administered 2022-02-16 – 2022-02-24 (×8): 25 mg via ORAL
  Filled 2022-02-16 (×8): qty 1

## 2022-02-16 MED ORDER — POLYETHYLENE GLYCOL 3350 17 G PO PACK
17.0000 g | PACK | Freq: Every day | ORAL | Status: DC | PRN
  Administered 2022-02-17 – 2022-02-21 (×2): 17 g via ORAL
  Filled 2022-02-16 (×2): qty 1

## 2022-02-16 MED ORDER — ALBUTEROL SULFATE (2.5 MG/3ML) 0.083% IN NEBU: 2.5000 mg | INHALATION_SOLUTION | Freq: Four times a day (QID) | RESPIRATORY_TRACT | Status: DC | PRN

## 2022-02-16 MED ORDER — TROLAMINE SALICYLATE 10 % EX CREA
TOPICAL_CREAM | Freq: Three times a day (TID) | CUTANEOUS | Status: DC
  Filled 2022-02-16 (×2): qty 85

## 2022-02-16 MED ORDER — PROCHLORPERAZINE EDISYLATE 10 MG/2ML IJ SOLN: 5.0000 mg | Freq: Four times a day (QID) | INTRAMUSCULAR | Status: DC | PRN

## 2022-02-16 MED ORDER — SENNOSIDES-DOCUSATE SODIUM 8.6-50 MG PO TABS
2.0000 | ORAL_TABLET | Freq: Two times a day (BID) | ORAL | Status: DC
  Administered 2022-02-16 – 2022-03-04 (×28): 2 via ORAL
  Filled 2022-02-16 (×32): qty 2

## 2022-02-16 MED ORDER — TICAGRELOR 90 MG PO TABS
90.0000 mg | ORAL_TABLET | Freq: Two times a day (BID) | ORAL | Status: DC
  Administered 2022-02-16 – 2022-03-04 (×33): 90 mg via ORAL
  Filled 2022-02-16 (×33): qty 1

## 2022-02-16 MED ORDER — CHLORHEXIDINE GLUCONATE CLOTH 2 % EX PADS
6.0000 | MEDICATED_PAD | Freq: Every day | CUTANEOUS | Status: DC
  Administered 2022-02-17: 6 via TOPICAL

## 2022-02-16 MED ORDER — AMANTADINE HCL 100 MG PO CAPS: 200.0000 mg | ORAL_CAPSULE | Freq: Two times a day (BID) | ORAL | 0 refills | Status: DC

## 2022-02-16 MED ORDER — LOSARTAN POTASSIUM 50 MG PO TABS
25.0000 mg | ORAL_TABLET | Freq: Every day | ORAL | Status: DC
  Administered 2022-02-17 – 2022-03-04 (×16): 25 mg via ORAL
  Filled 2022-02-16 (×16): qty 1

## 2022-02-16 MED ORDER — AMANTADINE HCL 100 MG PO CAPS
200.0000 mg | ORAL_CAPSULE | Freq: Two times a day (BID) | ORAL | Status: DC
  Administered 2022-02-16 – 2022-02-23 (×14): 200 mg via ORAL
  Filled 2022-02-16 (×14): qty 2

## 2022-02-16 MED ORDER — FLEET ENEMA 7-19 GM/118ML RE ENEM: 1.0000 | ENEMA | Freq: Once | RECTAL | Status: DC | PRN

## 2022-02-16 MED ORDER — SENNOSIDES-DOCUSATE SODIUM 8.6-50 MG PO TABS: 1.0000 | ORAL_TABLET | Freq: Two times a day (BID) | ORAL | 0 refills | Status: DC

## 2022-02-16 NOTE — H&P (Signed)
Physical Medicine and Rehabilitation Admission H&P    Chief Complaint  Patient presents with   Functional deficits due to stroke.     HPI:  Julia Deleon is a 76 year old female with history of CKD, HTN, Fuch's corneal endothelial dystrophy, mild Schatzki's ring (s/p dilatation 6/22),  chronic LBP w/ neurogenic B/B bladder; who was admitted on 01/29/22 with left sided weakness, left facial droop, dysarthria and left neglect followed by collapse to the floor. She was found to have acute occlusion of proximal M1 with non opacification of right M2/M3 segments  with decreased arborization in right MCA territory, severe stenosis > 75% at origin of R-ICA, high grade narrowing in distal BA and distal V4 segment of R-VA. MRI/MRA revealed moderate stenosis right cavernous ICA, high grade stenosis with asymmetric attenuation of distal MCA branches. She received TNKase and underwent mechanical thrombectomy with TICI2 revascularization with stent,  contrast staining, flow diverters for [facility] and carotid stent in right carotid bifurcation by Dr. Romie Jumper. Post procedure hypotension with bradycardia treated with dopamine. Dr Gardiner Rhyme felt that bradycardia felt to be due to carotid intervention and should improve with time a 2 D echo showed  EF 60-65% (biventricular function) and no significant valvular disease.   Follow up CT head 09/26 showed multifocal right hemisphere cytotoxic edema including new involvement of right inferior frontal gyrus, right basal gangli and right superior perirolandic cortex and trace IVH/SAH. She tolerated extubation but continued to have somnolence with lack of movement on the left. Cortak placed for nutritional support and she was started on D3 nectars she started showing improvement in mentation with improvement in orientation but developed significant headaches therefore head CT repeated 10/04 showing evolving infarcts right cerebral hemisphere with no hemorrhage. Topamax  added and titrated to 100 mg bid with use of Fioricet bid. Amantadine added to help with activation.  Cortack was d/c but IVF added to manage AKI. Leucocytosis felt to be due to UTI/pyuria and treated with rocephin X 7 days. She reports that she has a history of L4-6 fusion several years ago with and continues to have CLBP. She says she initially required IC for bladder management after her surgery however this has resolved and she was continent of bladder. She denies use of any regular laxatives or bowel medications at home. Therapy has been working with patient who continues to be limited by dense L hemiparesis with left inattention, depressed mood, back and right shoulder pain, needs verbal and tactile cues to follow one step commands with delay and generalized weakness. CIR recommended due to functional decline.    Review of Systems  Constitutional:  Negative for chills and fever.  HENT:  Negative for hearing loss.   Eyes:  Negative for blurred vision and double vision.  Respiratory:  Negative for cough and shortness of breath.   Cardiovascular:  Negative for chest pain and palpitations.  Gastrointestinal:  Negative for abdominal pain and heartburn.  Genitourinary:  Negative for dysuria.  Musculoskeletal:  Positive for back pain, myalgias and neck pain.  Neurological:  Positive for speech change, focal weakness, weakness and headaches.  Psychiatric/Behavioral:  Positive for depression. The patient does not have insomnia.      Past Medical History:  Diagnosis Date   Arthritis    Chronic kidney disease    COPD (chronic obstructive pulmonary disease) ([place])    pt denies    Dyspnea    Family history of adverse reaction to anesthesia    father  was slow to wake up   Fuch's endothelial dystrophy    GERD (gastroesophageal reflux disease)    Hyperlipidemia    Hypertension    Hypothyroidism    Pneumonia    hx of    PONV (postoperative nausea and vomiting)    slow to wake up and PONV     Past Surgical History:  Procedure Laterality Date   ABDOMINAL HYSTERECTOMY     APPENDECTOMY     BACK SURGERY  09/26/2018   L4-5 PLIF by Dr. Arnoldo Morale   CARDIAC CATHETERIZATION     CHOLECYSTECTOMY     COLONOSCOPY WITH PROPOFOL N/A 10/23/2020   Procedure: COLONOSCOPY WITH PROPOFOL;  Surgeon: Virgel Manifold, MD;  Location: [facility] ENDOSCOPY;  Service: Endoscopy;  Laterality: N/A;   ESOPHAGOGASTRODUODENOSCOPY N/A 10/23/2020   Procedure: ESOPHAGOGASTRODUODENOSCOPY (EGD);  Surgeon: Virgel Manifold, MD;  Location: [facility] ENDOSCOPY;  Service: Endoscopy;  Laterality: N/A;   EYE SURGERY     FOOT SURGERY Right    pin removed left   IR CT HEAD LTD  01/29/2022   IR CT HEAD LTD  01/29/2022   IR CT HEAD LTD  01/29/2022   IR INTRA CRAN STENT  01/29/2022   IR INTRAVSC STENT CERV CAROTID W/O EMB-PROT MOD SED INC ANGIO  01/29/2022   IR PERCUTANEOUS ART THROMBECTOMY/INFUSION INTRACRANIAL INC DIAG ANGIO  01/29/2022   IR US GUIDE VASC ACCESS RIGHT  01/29/2022   laser vein surgery     NASAL SINUS SURGERY     RADIOLOGY WITH ANESTHESIA N/A 01/29/2022   Procedure: IR WITH ANESTHESIA;  Surgeon: Radiologist, Medication, MD;  Location: [facility];  Service: Radiology;  Laterality: N/A;   RIGHT/LEFT HEART CATH AND CORONARY ANGIOGRAPHY N/A 12/06/2017   Procedure: RIGHT/LEFT HEART CATH AND CORONARY ANGIOGRAPHY;  Surgeon: Yolonda Kida, MD;  Location: [facility];  Service: Cardiovascular;  Laterality: N/A;   ROBOTIC ASSISTED LAPAROSCOPIC SACROCOLPOPEXY Bilateral 07/30/2020   Procedure: XI ROBOTIC ASSISTED LAPAROSCOPIC SACROCOLPOPEXY AND SUPRACERVICAL HYSTERECTOMY WITH BILATERAL SALPINGO OOPHERECTOMY;  Surgeon: Ardis Hughs, MD;  Location: [facility];  Service: Urology;  Laterality: Bilateral;  REQUESTING 4 HRS   TONSILLECTOMY      Family History  Problem Relation Age of Onset   Cancer Mother        lung   CAD Mother    Stroke Father    Breast cancer Sister        61s   Fibromyalgia Sister     Brain cancer Sister    Heart disease Maternal Grandmother    Cancer Paternal Grandmother        throat   Emphysema Paternal Grandfather    Varicose Veins Son    Breast cancer Other     Social History:  reports that she has never smoked. She has never used smokeless tobacco. She reports that she does not drink alcohol and does not use drugs.   Allergies  Allergen Reactions   Colesevelam Nausea And Vomiting    WELCHOL    Levofloxacin Hives   Amoxicillin    Benazepril Other (See Comments)   Clavulanic Acid Other (See Comments)   Methylisothiazolinone Other (See Comments)    Positive on allergy test   Thimerosal (Thiomersal) Other (See Comments)    Positive on allergy test   Amoxicillin-Pot Clavulanate Nausea Only    Did it involve swelling of the face/tongue/throat, SOB, or low BP? No Did it involve sudden or severe rash/hives, skin peeling, or any reaction on the inside of your mouth  or nose? No Did you need to seek medical attention at a hospital or doctor's office? Unknown When did it last happen?      unknown If all above answers are "NO", may proceed with cephalosporin use.    Latex Rash    Family history of latex allergy   Simvastatin Itching   Medications Prior to Admission  Medication Sig Dispense Refill   chlorzoxazone (PARAFON) 500 MG tablet Take 500 mg by mouth 4 (four) times daily as needed for muscle spasms.     colestipol (COLESTID) 1 g tablet Take 1 g by mouth 2 (two) times daily.     losartan (COZAAR) 100 MG tablet Take 100 mg by mouth daily.     acetaminophen (TYLENOL) 650 MG CR tablet Take 650-1,300 mg by mouth every 8 (eight) hours as needed for pain.      albuterol (PROVENTIL) (2.5 MG/3ML) 0.083% nebulizer solution Take 3 mLs (2.5 mg total) by nebulization every 6 (six) hours as needed for wheezing or shortness of breath. 75 mL 12   amantadine (SYMMETREL) 100 MG capsule Take 2 capsules (200 mg total) by mouth 2 (two) times daily. 60 capsule 0   [START ON  02/17/2022] aspirin 81 MG chewable tablet Chew 1 tablet (81 mg total) by mouth daily. 90 tablet 0   azelastine (ASTELIN) 0.1 % nasal spray Place 1 spray into both nostrils daily as needed for allergies. Use in each nostril as directed     Cholecalciferol (VITAMIN D-3 PO) Take 1 capsule by mouth daily.     EPINEPHrine 0.3 mg/0.3 mL IJ SOAJ injection Inject 0.3 mg into the muscle as needed for anaphylaxis.     Fexofenadine HCl (ALLEGRA PO) Take 1 tablet by mouth daily as needed (allergies).     heparin 5000 UNIT/ML injection Inject 1 mL (5,000 Units total) into the skin every 8 (eight) hours. 90 mL 0   [START ON 02/17/2022] levothyroxine (SYNTHROID) 75 MCG tablet Take 1 tablet (75 mcg total) by mouth daily at 6 (six) AM. 30 tablet 0   [START ON 02/17/2022] losartan (COZAAR) 25 MG tablet Take 1 tablet (25 mg total) by mouth daily. 30 tablet 0   LUTEIN PO Take 1 tablet by mouth daily.     magnesium oxide (MAG-OX) 400 MG tablet Take 1 tablet by mouth 2 (two) times daily.     pantoprazole sodium (PROTONIX) 40 mg Take 40 mg by mouth daily. 30 each 0   Prasterone, DHEA, (DHEA PO) Take 1 tablet by mouth daily.     [START ON 02/17/2022] rosuvastatin (CRESTOR) 20 MG tablet Take 1 tablet (20 mg total) by mouth daily. 30 tablet 0   senna-docusate (SENOKOT-S) 8.6-50 MG tablet Take 1 tablet by mouth 2 (two) times daily. 60 tablet 0   tamsulosin (FLOMAX) 0.4 MG CAPS capsule Take 1 capsule (0.4 mg total) by mouth daily. 30 capsule 0   terbinafine (LAMISIL) 250 MG tablet Take 250 mg by mouth daily.     ticagrelor (BRILINTA) 90 MG TABS tablet Take 1 tablet (90 mg total) by mouth 2 (two) times daily. 60 tablet 5   Turmeric (QC TUMERIC COMPLEX PO) Take 1 tablet by mouth daily.       Home: Home Living Family/patient expects to be discharged to:: Private residence Living Arrangements: Spouse/significant other Available Help at Discharge: Family, Available 24 hours/day Type of Home: House Home Access: Stairs to  enter [employer] of Steps: 8 Entrance Stairs-Rails: Right Home Layout: One level Bathroom Shower/Tub: Tub/shower  unit Bathroom Toilet: Standard Home Equipment: Conservation officer, nature (2 wheels), [employer] - single point, BSC/3in1, Industrial/product designer History: Prior Function Prior Level of Function : Independent/Modified Independent Mobility Comments: independent, drives. active with OPPT due to back pain, works out twice weekly with a trainer ADLs Comments: independent   Functional Status:  Mobility: Bed Mobility Overal bed mobility: Needs Assistance Bed Mobility: Rolling, Sidelying to Sit Rolling: Max assist Sidelying to sit: Max assist Supine to sit: Max assist Sit to supine: Max assist General bed mobility comments: rolling to the R requires max assist due to inability to use L UE and LE functionally to assist, maxAx2 for trunk elevation Transfers Overall transfer level: Needs assistance Equipment used: 2 person hand held assist (face to face transfer with use of bed pad) Transfers: Sit to/from Stand, Bed to chair/wheelchair/BSC Sit to Stand: Max assist, +2 physical assistance (x2 trials) Bed to/from chair/wheelchair/BSC transfer type:: Squat pivot Stand pivot transfers: Max assist, +2 physical assistance Step pivot transfers: Max assist, +2 physical assistance General transfer comment: max directional verbal cues, L knee blocked, pt with urinary incontinence upon first stand. pt cleaned up and socks changed prior to second stand and std pvt to chair, pt unable to advance R LE despite max verbal and tactile cues Ambulation/Gait General Gait Details: unable   ADL: ADL Overall ADL's : Needs assistance/impaired Eating/Feeding: Supervision/ safety, Sitting Eating/Feeding Details (indicate cue type and reason): completing meal upon entry Grooming: Wash/dry face, Brushing hair, Minimal assistance, Sitting Grooming Details (indicate cue type and reason): assistance to comb  left side Upper Body Bathing: Maximal assistance Lower Body Bathing: Maximal assistance, Bed level Upper Body Dressing : Moderate assistance, Sitting Upper Body Dressing Details (indicate cue type and reason): education on hemi technique Lower Body Dressing: Maximal assistance, Sitting/lateral leans Lower Body Dressing Details (indicate cue type and reason): able to pull up sock from ankle level on Right and unablet to perform on left Toilet Transfer: Maximal assistance, +2 for physical assistance, +2 for safety/equipment Toileting- Clothing Manipulation and Hygiene: Maximal assistance, +2 for physical assistance, +2 for safety/equipment, Sit to/from stand Functional mobility during ADLs: Maximal assistance, +2 for physical assistance (unableto achieve full upright posture) General ADL Comments: education on hemi technique while seated in recliner   Cognition: Cognition Overall Cognitive Status: Impaired/Different from baseline Arousal/Alertness: Lethargic Orientation Level: Oriented X4 Cognition Arousal/Alertness: Awake/alert Behavior During Therapy: Flat affect Overall Cognitive Status: Impaired/Different from baseline Area of Impairment: Memory, Following commands, Safety/judgement, Problem solving Current Attention Level: Focused Memory: Decreased short-term memory Following Commands: Follows one step commands with increased time Safety/Judgement: Decreased awareness of safety, Decreased awareness of deficits Problem Solving: Slow processing, Decreased initiation, Difficulty sequencing, Requires verbal cues, Requires tactile cues General Comments: can draw pt's attension left with mod to maximal cuing, pt with depressed Difficult to assess due to: Level of arousal       Blood pressure 132/78, pulse 65, temperature 97.8 F (36.6 C), temperature source Oral, resp. rate 15, SpO2 100 %.  General: No apparent distress HEENT: Head is normocephalic, atraumatic, PERRLA, EOMI, sclera  anicteric, oral mucosa pink and moist Neck: Supple without JVD or lymphadenopathy Heart: Reg rate and rhythm. No murmurs rubs or gallops Chest: CTA bilaterally without wheezes, rales, or rhonchi; no distress Abdomen: Soft, non-tender, non-distended, bowel sounds positive. Extremities: No clubbing, cyanosis, or edema. Pulses are 2+ Psych: Pt's affect is flat. Pt is cooperative Skin: Clean and intact without signs of breakdown Ecchymosis noted on b/l UE Neuro:  Alert and oriented to person, place, year, month, not date. She is a little drowsy. No dysphagia or dysarthria noted. Tongue midline. Left facial weakness. Right gaze preference. Left inattention.Able to cross midline to left.  Sensation intact to b/l face.  Able to follow simple motor commands. Strength 5/5 in RUE and RLE Strength 0/5 in LLE and LUE Sensation to LT intact in all 4 extremities FTN intact on RUE Musculoskeletal:  Left flaccid hemiparesis but reflexic movements of LLE noted.   No joint swelling noted L shoulder subluxation present  PICC line R arm  Results for orders placed or performed during the hospital encounter of 01/29/22 (from the past 48 hour(s))  Glucose, capillary     Status: Abnormal   Collection Time: 02/14/22  4:35 PM  Result Value Ref Range   Glucose-Capillary 107 (H) 70 - 99 mg/dL    Comment: Glucose reference range applies only to samples taken after fasting for at least 8 hours.  Glucose, capillary     Status: Abnormal   Collection Time: 02/14/22  9:37 PM  Result Value Ref Range   Glucose-Capillary 119 (H) 70 - 99 mg/dL    Comment: Glucose reference range applies only to samples taken after fasting for at least 8 hours.   Comment 1 Notify RN    Comment 2 Document in Chart   Glucose, capillary     Status: Abnormal   Collection Time: 02/15/22  6:01 AM  Result Value Ref Range   Glucose-Capillary 110 (H) 70 - 99 mg/dL    Comment: Glucose reference range applies only to samples taken after fasting  for at least 8 hours.   Comment 1 Notify RN    Comment 2 Document in Chart   Glucose, capillary     Status: Abnormal   Collection Time: 02/15/22 10:35 PM  Result Value Ref Range   Glucose-Capillary 114 (H) 70 - 99 mg/dL    Comment: Glucose reference range applies only to samples taken after fasting for at least 8 hours.  CBC     Status: Abnormal   Collection Time: 02/16/22  4:00 AM  Result Value Ref Range   WBC 12.1 (H) 4.0 - 10.5 K/uL   RBC 3.20 (L) 3.87 - 5.11 MIL/uL   Hemoglobin 10.3 (L) 12.0 - 15.0 g/dL   HCT 30.3 (L) 36.0 - 46.0 %   MCV 94.7 80.0 - 100.0 fL   MCH 32.2 26.0 - 34.0 pg   MCHC 34.0 30.0 - 36.0 g/dL   RDW 14.6 11.5 - 15.5 %   Platelets 294 150 - 400 K/uL   nRBC 0.0 0.0 - 0.2 %    Comment: Performed at [facility], [place] [address], [place], [place] [address]  Basic metabolic panel     Status: Abnormal   Collection Time: 02/16/22  4:00 AM  Result Value Ref Range   Sodium 135 135 - 145 mmol/L   Potassium 3.7 3.5 - 5.1 mmol/L   Chloride 108 98 - 111 mmol/L   CO2 17 (L) 22 - 32 mmol/L   Glucose, Bld 114 (H) 70 - 99 mg/dL    Comment: Glucose reference range applies only to samples taken after fasting for at least 8 hours.   BUN 25 (H) 8 - 23 mg/dL   Creatinine, Ser 1.00 0.44 - 1.00 mg/dL   Calcium 9.0 8.9 - 10.3 mg/dL   GFR, Estimated 59 (L) >60 mL/min    Comment: (NOTE) Calculated using the CKD-EPI Creatinine Equation (2021)  Anion gap 10 5 - 15    Comment: Performed at [facility] [address], [place], [place] [address]  Glucose, capillary     Status: Abnormal   Collection Time: 02/16/22  6:53 AM  Result Value Ref Range   Glucose-Capillary 170 (H) 70 - 99 mg/dL    Comment: Glucose reference range applies only to samples taken after fasting for at least 8 hours.  Glucose, capillary     Status: Abnormal   Collection Time: 02/16/22 11:24 AM  Result Value Ref Range   Glucose-Capillary 118 (H) 70 - 99 mg/dL    Comment: Glucose reference  range applies only to samples taken after fasting for at least 8 hours.   No results found.    Blood pressure 132/78, pulse 65, temperature 97.8 F (36.6 C), temperature source Oral, resp. rate 15, SpO2 100 %.  Medical Problem List and Plan: 1. Functional deficits secondary to   Right MCA infarct s/p TNK and IR with TICI2c with left hemiplegia  -patient may  shower  -ELOS/Goals: 12-14 days  -Admit to CIR  -PRAFO ordered  -She is on amantadine '200mg'$  BID for arousal 2.  Antithrombotics: -DVT/anticoagulation:  Pharmaceutical: Lovenox  -antiplatelet therapy: ASA/Brilinta.  3. Pain Management:  Tylenol.  --Will add aquatherapy for local measures.  4. Mood/Behavior/Sleep: LCSW to follow for evaluation and support.   -antipsychotic agents: N/A 5. Neuropsych/cognition: This patient may not be fully capable of making decisions on her own behalf. 6. Skin/Wound Care: Routine pressure relief measures 7. Fluids/Electrolytes/Nutrition: Monitor I/O. Check CMET in am.  8. CAS s/p stent: On ASA/Brilinta 9. HTN: Monitor BP TID--continue Losartan '25mg'$  10. Leucocytosis/UTI: Treated with rocephin. 11. AKI: Improved with IVF on board-->d/c and encourage fluid intake. Last Cr 1.0 Bun 25 on 10/10, recheck tomorrow 12. Dysphagia: Continue D3, honey thick liquids. Will d/c IVF and monitor hydration status.  --encourage water protocol 13. Chronic LBP w/neurogenic B/B: Will monitor voiding with PVR checks.   --local measures for back pain (was getting outpatient PT) 14. Headache  -Continue Topamax and Fioricet prn 15. HLD  -Continue crestor 16. COPD  -Albuterol PRN 17. Hypothyroidism  -Continue synthroid   Bary Leriche, PA-C 02/16/2022   I have personally performed a face to face diagnostic evaluation of this patient and formulated the key components of the plan.  Additionally, I have personally reviewed laboratory data, imaging studies, as well as relevant notes and concur with the physician  assistant's documentation above.  The patient's status has not changed from the original H&P.  Any changes in documentation from the acute care chart have been noted above.  Jennye Boroughs, MD, [facility]

## 2022-02-16 NOTE — Progress Notes (Signed)
PMR Admission Coordinator Pre-Admission Assessment   Patient: Julia Deleon is an 76 y.o., female MRN: [id number] DOB: 1945-12-08 Height: _0  (162.6 cm) Weight: 69.8 kg   Insurance Information HMO:     PPO:      PCP:      IPA:      80/20:      OTHER:  PRIMARY: Medicare A and B      Policy#: [id number]      Subscriber: patient CM Name:        Phone#:       Fax#:   Pre-Cert#:        Employer: Retired Benefits:  Phone #:       Name: Checked in passport one source [employer] Date: 10/08/2012     Deduct: $1600      Out of Pocket Max: none      Life Max: N/A CIR: 100%      SNF: 100 days Outpatient: 80%     Co-Pay: 20% Home Health: 100%      Co-Pay: none DME: 80%     Co-Pay: 20% Providers: patients choice  SECONDARY: Generic commercial      Policy#: [id number]     Phone#: [phone]   Financial Counselor:        Phone#:     The "Data Collection Information Summary" for patients in Inpatient Rehabilitation Facilities with attached "Privacy Act [place] Records" was provided and verbally reviewed with: Patient and Family   Emergency Contact Information Contact Information       Name Relation Home Work [place] R Spouse     [place] Daughter     [phone]           Current Medical History  Patient Admitting Diagnosis: R CVA   History of Present Illness: A 76 year old female with the above medical history who presented to ED as acute stroke code on 01/29/2022.  He had acute onset of left-sided weakness and left facial droop at 10:20 AM while she was working out.  In the ED, she was given TNK.  She was found to have right MCA occlusion as well as near occlusive stenosis of right internal carotid artery.  Underwent thrombectomy of M1/M2 and subsequent stent angioplasty, as well as right ICA stent angioplasty.  While is the ICU had issues with bradycardia and hypotension. PT/OT/SLP evaluations were completed with recommendations for acute  inpatient rehab admission.   Complete NIHSS TOTAL: 10   Patient's medical record from [place] has been reviewed by the rehabilitation admission coordinator and physician.   Past Medical History      Past Medical History:  Diagnosis Date   Arthritis     Chronic kidney disease     COPD (chronic obstructive pulmonary disease) ([place])      pt denies    Dyspnea     Family history of adverse reaction to anesthesia      father was slow to wake up   Fuch's endothelial dystrophy     GERD (gastroesophageal reflux disease)     Hyperlipidemia     Hypertension     Hypothyroidism     Pneumonia      hx of    PONV (postoperative nausea and vomiting)      slow to wake up and PONV      Has the patient had major surgery during 100 days prior to admission? Yes   Family History   family history  includes Brain cancer in her sister; Breast cancer in her sister and another family member; CAD in her mother; Cancer in her mother and paternal grandmother; Emphysema in her paternal grandfather; Fibromyalgia in her sister; Heart disease in her maternal grandmother; Stroke in her father; Varicose Veins in her son.   Current Medications   Current Facility-Administered Medications:    0.9 %  sodium chloride infusion, 250 mL, Intravenous, Continuous, Rosalin Hawking, MD, Last Rate: 50 mL/hr at 02/15/22 1500, Infusion Verify at 02/15/22 1500   acetaminophen (TYLENOL) tablet 650 mg, 650 mg, Oral, Q6H, 650 mg at 02/16/22 0536 **OR** acetaminophen (TYLENOL) 160 MG/5ML solution 650 mg, 650 mg, Per Tube, Q6H **OR** acetaminophen (TYLENOL) suppository 650 mg, 650 mg, Rectal, Q6H, Rosalin Hawking, MD   albuterol (PROVENTIL) (2.5 MG/3ML) 0.083% nebulizer solution 2.5 mg, 2.5 mg, Nebulization, Q6H PRN, Charlean Merl, Devon, NP   amantadine (SYMMETREL) capsule 200 mg, 200 mg, Oral, BID, Jardin, Carla G, RPH, 200 mg at 02/15/22 2130   aspirin chewable tablet 81 mg, 81 mg, Oral, Daily, Rosalin Hawking, MD, 81 mg at 02/15/22 0943    butalbital-acetaminophen-caffeine (FIORICET) 50-325-40 MG per tablet 1 tablet, 1 tablet, Oral, Q12H PRN, Rosalin Hawking, MD, 1 tablet at 02/16/22 0606   Chlorhexidine Gluconate Cloth 2 % PADS 6 each, 6 each, Topical, Q0600, Kerney Elbe, MD, 6 each at 02/16/22 0536   feeding supplement (NEPRO CARB STEADY) liquid 237 mL, 237 mL, Oral, BID BM, Rosalin Hawking, MD, 237 mL at 02/15/22 1359   food thickener (SIMPLYTHICK (NECTAR/LEVEL 2/MILDLY THICK)) 1 packet, 1 packet, Oral, PRN, Rosalin Hawking, MD   heparin injection 5,000 Units, 5,000 Units, Subcutaneous, Q8H, Shafer, Devon, NP, 5,000 Units at 02/16/22 0535   hydrALAZINE (APRESOLINE) injection 10 mg, 10 mg, Intravenous, Q4H PRN, Garvin Fila, MD   [START ON 02/17/2022] influenza vaccine adjuvanted (FLUAD) injection 0.5 mL, 0.5 mL, Intramuscular, Tomorrow-1000, Sethi, Pramod S, MD   levothyroxine (SYNTHROID) tablet 75 mcg, 75 mcg, Oral, Q0600, Rosalin Hawking, MD, 75 mcg at 02/16/22 0536   losartan (COZAAR) tablet 25 mg, 25 mg, Oral, Daily, Rosalin Hawking, MD, 25 mg at 02/15/22 9735   Oral care mouth rinse, 15 mL, Mouth Rinse, 4 times per day, Janine Ores, NP, 15 mL at 02/15/22 2131   Oral care mouth rinse, 15 mL, Mouth Rinse, PRN, Charlean Merl, Devon, NP   pantoprazole (PROTONIX) EC tablet 40 mg, 40 mg, Oral, Daily, Rosalin Hawking, MD, 40 mg at 02/15/22 0942   polyethylene glycol (MIRALAX / GLYCOLAX) packet 17 g, 17 g, Oral, BID, Rosalin Hawking, MD, 17 g at 02/15/22 0944   rosuvastatin (CRESTOR) tablet 20 mg, 20 mg, Oral, Daily, Rosalin Hawking, MD, 20 mg at 02/15/22 0944   senna-docusate (Senokot-S) tablet 1 tablet, 1 tablet, Oral, BID, Rosalin Hawking, MD, 1 tablet at 02/15/22 0943   sodium chloride flush (NS) 0.9 % injection 10-40 mL, 10-40 mL, Intracatheter, Q12H, Leonie Man, Pramod S, MD, 10 mL at 02/15/22 2131   sodium chloride flush (NS) 0.9 % injection 10-40 mL, 10-40 mL, Intracatheter, PRN, Garvin Fila, MD   tamsulosin [facility]) capsule 0.4 mg, 0.4 mg, Oral, Daily, Beulah Gandy A, NP, 0.4 mg at 02/15/22 1710   ticagrelor (BRILINTA) tablet 90 mg, 90 mg, Oral, BID, Rosalin Hawking, MD, 90 mg at 02/15/22 2130   topiramate (TOPAMAX) tablet 100 mg, 100 mg, Oral, BID, Rosalin Hawking, MD, 100 mg at 02/15/22 2130   Patients Current Diet:  Diet Order  DIET DYS 3 Room service appropriate? Yes with Assist; Fluid consistency: Nectar Thick  Diet effective now                         Precautions / Restrictions Precautions Precautions: Fall Precaution Comments: L hemiparesis, L inattention Restrictions Weight Bearing Restrictions: No    Has the patient had 2 or more falls or a fall with injury in the past year? No   Prior Activity Level Community (5-7x/wk): Went out daily.  Was driving.   Prior Functional Level Self Care: Did the patient need help bathing, dressing, using the toilet or eating? Independent   Indoor Mobility: Did the patient need assistance with walking from room to room (with or without device)? Independent   Stairs: Did the patient need assistance with internal or external stairs (with or without device)? Independent   Functional Cognition: Did the patient need help planning regular tasks such as shopping or remembering to take medications? Independent   Patient Information Are you of Hispanic, Latino/a,or Spanish origin?: A. No, not of Hispanic, Latino/a, or Spanish origin What is your race?: A. White Do you need or want an interpreter to communicate with a doctor or health care staff?: 0. No   Patient's Response To:  Health Literacy and Transportation Is the patient able to respond to health literacy and transportation needs?: Yes Health Literacy - How often do you need to have someone help you when you read instructions, pamphlets, or other written material from your doctor or pharmacy?: Never In the past 12 months, has lack of transportation kept you from medical appointments or from getting medications?: No In the past  12 months, has lack of transportation kept you from meetings, work, or from getting things needed for daily living?: No   Development worker, international aid / [place] Devices/Equipment: None Home Equipment: Conservation officer, nature (2 wheels), [employer] - single point, BSC/3in1, Civil engineer, contracting   Prior Device Use: Indicate devices/aids used by the patient prior to current illness, exacerbation or injury? None of the above   Current Functional Level Cognition   Arousal/Alertness: Lethargic Overall Cognitive Status: Impaired/Different from baseline Difficult to assess due to: Level of arousal Current Attention Level: Focused Orientation Level: Oriented X4 Following Commands: Follows one step commands with increased time Safety/Judgement: Decreased awareness of safety, Decreased awareness of deficits General Comments: can draw pt's attension left with mod to maximal cuing, pt with depressed    Extremity Assessment (includes Sensation/Coordination)   Upper Extremity Assessment: RUE deficits/detail, LUE deficits/detail RUE Deficits / Details: generalized weakness LUE Deficits / Details: imparied sensory motor/impaired tone; moving LUE however not functionally/controlled or on command; complaining of pain L shoulder LUE Sensation: decreased light touch, decreased proprioception LUE Coordination: decreased fine motor, decreased gross motor  Lower Extremity Assessment: Defer to PT evaluation LLE Deficits / Details: Increased tone present, no active movement appreciated to command. LLE Sensation: WNL     ADLs   Overall ADL's : Needs assistance/impaired Eating/Feeding: Supervision/ safety, Sitting Eating/Feeding Details (indicate cue type and reason): completing meal upon entry Grooming: Wash/dry face, Brushing hair, Minimal assistance, Sitting Grooming Details (indicate cue type and reason): assistance to comb left side Upper Body Bathing: Maximal assistance Lower Body Bathing: Maximal assistance, Bed  level Upper Body Dressing : Moderate assistance, Sitting Upper Body Dressing Details (indicate cue type and reason): education on hemi technique Lower Body Dressing: Maximal assistance, Sitting/lateral leans Lower Body Dressing Details (indicate cue type and reason): able to  pull up sock from ankle level on Right and unablet to perform on left Toilet Transfer: Maximal assistance, +2 for physical assistance, +2 for safety/equipment Toileting- Clothing Manipulation and Hygiene: Maximal assistance, +2 for physical assistance, +2 for safety/equipment, Sit to/from stand Functional mobility during ADLs: Maximal assistance, +2 for physical assistance (unableto achieve full upright posture) General ADL Comments: education on hemi technique while seated in recliner     Mobility   Overal bed mobility: Needs Assistance Bed Mobility: Rolling, Sidelying to Sit Rolling: Max assist Sidelying to sit: Max assist Supine to sit: Max assist Sit to supine: Max assist General bed mobility comments: rolling to the R requires max assist due to inability to use L UE and LE functionally to assist, maxAx2 for trunk elevation     Transfers   Overall transfer level: Needs assistance Equipment used: 2 person hand held assist (face to face transfer with use of bed pad) Transfers: Sit to/from Stand, Bed to chair/wheelchair/BSC Sit to Stand: Max assist, +2 physical assistance (x2 trials) Bed to/from chair/wheelchair/BSC transfer type:: Squat pivot Stand pivot transfers: Max assist, +2 physical assistance Step pivot transfers: Max assist, +2 physical assistance General transfer comment: max directional verbal cues, L knee blocked, pt with urinary incontinence upon first stand. pt cleaned up and socks changed prior to second stand and std pvt to chair, pt unable to advance R LE despite max verbal and tactile cues     Ambulation / Gait / Stairs / Wheelchair Mobility   Ambulation/Gait General Gait Details: unable      Posture / Balance Dynamic Sitting Balance Sitting balance - Comments: work at [employer] for 6-7 min on balance, inhibiting R push, pt able to hold onto bed rail with R UE and maintain midline posture however despite max verbal/directional cues pt unable to maintain midline without R UE support or external support if not using R UE Balance Overall balance assessment: Needs assistance Sitting-balance support: Feet supported, Single extremity supported Sitting balance-Leahy Scale: Poor Sitting balance - Comments: work at [employer] for 6-7 min on balance, inhibiting R push, pt able to hold onto bed rail with R UE and maintain midline posture however despite max verbal/directional cues pt unable to maintain midline without R UE support or external support if not using R UE Postural control: Posterior lean, Left lateral lean Standing balance support: Bilateral upper extremity supported Standing balance-Leahy Scale: Zero Standing balance comment: dependent on external assist     Special needs/care consideration Skin Right thigh incision with dressing and Special service needs Currently has coretrak for tube feedings.    Previous Home Environment (from acute therapy documentation) Living Arrangements: Spouse/significant other Available Help at Discharge: Family, Available 24 hours/day Type of Home: House Home Layout: One level Home Access: Stairs to enter Entrance Stairs-Rails: Right Entrance Stairs-Number of Steps: 8 Bathroom Shower/Tub: Chiropodist: [place]: No   Discharge Living Setting Plans for Discharge Living Setting: Patient's home, House, Lives with (comment) (Lives with husband.) Type of Home at Discharge: House Discharge Home Layout: One level Discharge Home Access: Stairs to enter Entrance Stairs-Rails: None Entrance Stairs-Number of Steps: 1 step at garage entry Discharge Bathroom Shower/Tub: Tub/shower unit, Curtain Discharge Bathroom Toilet:  Standard Discharge Bathroom Accessibility: Yes How Accessible: Accessible via walker Does the patient have any problems obtaining your medications?: No   Social/Family/Support Systems Patient Roles: Spouse, Parent (Has a husband, daughter and a sister.) Contact Information: Lutie Pickler - spouse Anticipated Caregiver: husband Anticipated Caregiver's Contact Information:  Kyarra Vancamp - husband - [phone] Ability/Limitations of Caregiver: Husband works as a Pharmacist, hospital on Tuesday and Thursday from 11 am to 6 pm.  He plans to talk with daughter and sister to find assistance. Caregiver Availability: Other (Comment) (Husband will try to establish 24/7 supervision) Discharge Plan Discussed with Primary Caregiver: Yes Is Caregiver In Agreement with Plan?: Yes Does Caregiver/Family have Issues with Lodging/Transportation while Pt is in Rehab?: No   Goals Patient/Family Goal for Rehab: PT/OT/SLP supervision goals Expected length of stay: 12-14 days Pt/Family Agrees to Admission and willing to participate: Yes Program Orientation Provided & Reviewed with Pt/Caregiver Including Roles  & Responsibilities: Yes   Decrease burden of Care through IP rehab admission: Diet advancement, Decrease number of caregivers, Bowel and bladder program, and Patient/family education   Possible need for SNF placement upon discharge: Yes, may need SNF placement if she cannot make sufficient progress prior to discharge date   Patient Condition: I have reviewed medical records from [facility], spoken with CM, and patient and spouse. I met with patient at the bedside for inpatient rehabilitation assessment.  Patient will benefit from ongoing PT, OT, and SLP, can actively participate in 3 hours of therapy a day 5 days of the week, and can make measurable gains during the admission.  Patient will also benefit from the coordinated team approach during an Inpatient Acute Rehabilitation admission.  The patient will receive  intensive therapy as well as Rehabilitation physician, nursing, social worker, and care management interventions.  Due to bladder management, bowel management, safety, skin/wound care, disease management, medication administration, pain management, and patient education the patient requires 24 hour a day rehabilitation nursing.  The patient is currently Max+2  with mobility and basic ADLs.  Discharge setting and therapy post discharge at home with home health is anticipated.  Patient has agreed to participate in the Acute Inpatient Rehabilitation Program and will admit today.   Preadmission Screen Completed By:  Genella Mech, 02/16/2022 9:47 AM with updates by Clemens Catholic, MS, CCC-SLP ______________________________________________________________________   Discussed status with Dr. Curlene Dolphin on 02/16/22 at 78 and received approval for admission today.   Admission Coordinator:  Genella Mech, CCC-SLP, time 930/Date 02/16/22     Assessment/Plan: Diagnosis:Right MCA infarct  Does the need for close, 24 hr/day Medical supervision in concert with the patient's rehab needs make it unreasonable for this patient to be served in a less intensive setting? Yes Co-Morbidities requiring supervision/potential complications: HTN, HLD,CKD,, COPD  Due to bladder management, bowel management, safety, skin/wound care, disease management, medication administration, pain management, and patient education, does the patient require 24 hr/day rehab nursing? Yes Does the patient require coordinated care of a physician, rehab nurse, PT, OT, and SLP to address physical and functional deficits in the context of the above medical diagnosis(es)? Yes Addressing deficits in the following areas: balance, endurance, locomotion, strength, transferring, bowel/bladder control, bathing, dressing, feeding, grooming, toileting, cognition, speech, language, swallowing, and psychosocial support Can the patient actively participate in  an intensive therapy program of at least 3 hrs of therapy 5 days a week? Yes The potential for patient to make measurable gains while on inpatient rehab is excellent Anticipated functional outcomes upon discharge from inpatient rehab: supervision PT, supervision OT, supervision SLP Estimated rehab length of stay to reach the above functional goals is: 12-14 Anticipated discharge destination: Home 10. Overall Rehab/Functional Prognosis: excellent     MD Signature: Jennye Boroughs

## 2022-02-16 NOTE — Progress Notes (Signed)
Inpatient Rehabilitation Admission Medication Review by a Pharmacist  A complete drug regimen review was completed for this patient to identify any potential clinically significant medication issues.  High Risk Drug Classes Is patient taking? Indication by Medication  Antipsychotic Yes Prochlorperazine prn nausea  Anticoagulant Yes Enoxaparin for DVT prophylaxis  Antibiotic No   Opioid No   Antiplatelet Yes Aspirin and ticagrelor for stroke prevention    Hypoglycemics/insulin No   Vasoactive Medication Yes Losartan for HTN   Chemotherapy No   Other Yes Amantadine for stroke recovery Estradiol cream for vulvovaginal atrophy  Levothyroxine for hypothyroidism Pantoprazole for GI prophylaxis post-stroke Rosuvastatin for HLD Tamsulosin for urinary retention Topiramate for headache prevention Aspercream for muscle aches Fioricet for headache Senna-doc, bisacodyn, miralax, fleet enema for constipation Diphenhydramine PRN itching Trazodone PRN sleep  Guaifenesin-dextromethorphan prn cough Albuterol PRN wheezing/shortness of breath Acetaminophen PRN pain/fever     Type of Medication Issue Identified Description of Issue Recommendation(s)  Drug Interaction(s) (clinically significant)     Duplicate Therapy     Allergy     No Medication Administration End Date  Continue ticagrelor for 6 months (end ~July 28, 2022) Add to discharge summary   Incorrect Dose     Additional Drug Therapy Needed     Significant med changes from prior encounter (inform family/care partners about these prior to discharge). Decreased losartan dose NEW aspirin, Rosuvastatin, senna-doc, tamsulosin, ticagrelor, topiramate Reassess and educate at discharge   Other  Pantoprazole ordered for GI prophylaxis post-stroke but is not a home med Reassess if pantoprazole is still needed d     Clinically significant medication issues were identified that warrant physician communication and completion of  prescribed/recommended actions by midnight of the next day:  No  Name of provider notified for urgent issues identified:   Provider Method of Notification:     Pharmacist comments:   Time spent performing this drug regimen review (minutes):  [place], PharmD, BCPS, [facility] Clinical Pharmacist  Please check AMION for all [place] phone numbers After 10:00 PM, call [place]

## 2022-02-16 NOTE — Progress Notes (Signed)
Orthopedic Tech Progress Note Patient Details:  Julia Deleon 01-May-1946 [id number]  Called in order to [facility] for a REHAB COMBO   Patient ID: [name], female   DOB: 01/17/1946, 76 y.o.   MRN: [id number]  [name] 02/16/2022, 4:15 PM

## 2022-02-16 NOTE — Discharge Summary (Addendum)
Stroke Discharge Summary  Patient ID: Shyteria C Perlman    l   MRN: [id number]      DOB: December 22, 1945  Date of Admission: 01/29/2022 Date of Discharge: 02/16/2022  Attending Physician:  Stroke, Md, MD, Stroke MD Consultant(s):   cardiology Patient's PCP:  Idelle Crouch, MD  Discharge Diagnoses:  Right MCA Infarct Principal Problem:   Acute ischemic stroke [facility]) :  Right MCA infarct s/p TNK and IR with TICI2c, likely due to large vessel disease from R ICA highgrade stenosis    s/p angioplasty and stenting  Active Problems: Left hemiplegia Urinary retention requiring indwelling Foley   COPD (chronic obstructive pulmonary disease) ([place])   Chronic kidney disease, stage III (moderate) ([facility])   Hypothyroidism   CAD (coronary artery disease)   Aortic atherosclerosis ([place])   UTI (urinary tract infection)   Headache   Hypotension   Bradycardia   Dysphagia   Urinary retention Symptomatic right internal carotid stenosis Hyperlipidemia Right internal carotid angioplasty and stenting   Medications to be continued on Rehab Allergies as of 02/16/2022       Reactions   Colesevelam Nausea And Vomiting   WELCHOL   Levofloxacin Hives   Amoxicillin    Benazepril Other (See Comments)   Clavulanic Acid Other (See Comments)   Methylisothiazolinone Other (See Comments)   Positive on allergy test   Thimerosal (thiomersal) Other (See Comments)   Positive on allergy test   Amoxicillin-pot Clavulanate Nausea Only   Did it involve swelling of the face/tongue/throat, SOB, or low BP? No Did it involve sudden or severe rash/hives, skin peeling, or any reaction on the inside of your mouth or nose? No Did you need to seek medical attention at a hospital or doctor's office? Unknown When did it last happen?      unknown If all above answers are "NO", may proceed with cephalosporin use.   Latex Rash   Family history of latex allergy   Simvastatin Itching        Medication List     STOP taking  these medications    AMBULATORY NON FORMULARY MEDICATION   chlorzoxazone 500 MG tablet Commonly known as: PARAFON   colestipol 1 g tablet Commonly known as: COLESTID   estradiol 0.1 MG/GM vaginal cream Commonly known as: ESTRACE   losartan 100 MG tablet Commonly known as: COZAAR Replaced by: losartan 25 MG tablet       TAKE these medications    acetaminophen 650 MG CR tablet Commonly known as: TYLENOL Take 650-1,300 mg by mouth every 8 (eight) hours as needed for pain.   albuterol (2.5 MG/3ML) 0.083% nebulizer solution Commonly known as: PROVENTIL Take 3 mLs (2.5 mg total) by nebulization every 6 (six) hours as needed for wheezing or shortness of breath.   ALLEGRA PO Take 1 tablet by mouth daily as needed (allergies).   amantadine 100 MG capsule Commonly known as: SYMMETREL Take 2 capsules (200 mg total) by mouth 2 (two) times daily.   aspirin 81 MG chewable tablet Chew 1 tablet (81 mg total) by mouth daily. Start taking on: February 17, 2022   azelastine 0.1 % nasal spray Commonly known as: ASTELIN Place 1 spray into both nostrils daily as needed for allergies. Use in each nostril as directed   DHEA PO Take 1 tablet by mouth daily.   EPINEPHrine 0.3 mg/0.3 mL Soaj injection Commonly known as: EPI-PEN Inject 0.3 mg into the muscle as needed for anaphylaxis.   heparin 5000 UNIT/ML injection Inject  1 mL (5,000 Units total) into the skin every 8 (eight) hours.   levothyroxine 75 MCG tablet Commonly known as: SYNTHROID Take 1 tablet (75 mcg total) by mouth daily at 6 (six) AM. Start taking on: February 17, 2022 What changed: See the new instructions.   losartan 25 MG tablet Commonly known as: COZAAR Take 1 tablet (25 mg total) by mouth daily. Start taking on: February 17, 2022 Replaces: losartan 100 MG tablet   LUTEIN PO Take 1 tablet by mouth daily.   magnesium oxide 400 MG tablet Commonly known as: MAG-OX Take 1 tablet by mouth 2 (two) times daily.    pantoprazole sodium 40 mg Commonly known as: PROTONIX Take 40 mg by mouth daily.   QC TUMERIC COMPLEX PO Take 1 tablet by mouth daily.   rosuvastatin 20 MG tablet Commonly known as: CRESTOR Take 1 tablet (20 mg total) by mouth daily. Start taking on: February 17, 2022   senna-docusate 8.6-50 MG tablet Commonly known as: Senokot-S Take 1 tablet by mouth 2 (two) times daily.   tamsulosin 0.4 MG Caps capsule Commonly known as: FLOMAX Take 1 capsule (0.4 mg total) by mouth daily.   terbinafine 250 MG tablet Commonly known as: LAMISIL Take 250 mg by mouth daily.   ticagrelor 90 MG Tabs tablet Commonly known as: BRILINTA Take 1 tablet (90 mg total) by mouth 2 (two) times daily.   VITAMIN D-3 PO Take 1 capsule by mouth daily.        LABORATORY STUDIES CBC    Component Value Date/Time   WBC 12.1 (H) 02/16/2022 0400   RBC 3.20 (L) 02/16/2022 0400   HGB 10.3 (L) 02/16/2022 0400   HGB 13.4 04/13/2021 0856   HCT 30.3 (L) 02/16/2022 0400   HCT 40.5 04/13/2021 0856   PLT 294 02/16/2022 0400   PLT 219 04/13/2021 0856   MCV 94.7 02/16/2022 0400   MCV 94 04/13/2021 0856   MCH 32.2 02/16/2022 0400   MCHC 34.0 02/16/2022 0400   RDW 14.6 02/16/2022 0400   RDW 12.7 04/13/2021 0856   LYMPHSABS 2.0 01/29/2022 1107   LYMPHSABS 2.2 04/13/2021 0856   MONOABS 0.6 01/29/2022 1107   EOSABS 0.1 01/29/2022 1107   EOSABS 0.2 04/13/2021 0856   BASOSABS 0.0 01/29/2022 1107   BASOSABS 0.1 04/13/2021 0856   CMP    Component Value Date/Time   NA 135 02/16/2022 0400   NA 141 04/13/2021 0856   K 3.7 02/16/2022 0400   CL 108 02/16/2022 0400   CO2 17 (L) 02/16/2022 0400   GLUCOSE 114 (H) 02/16/2022 0400   BUN 25 (H) 02/16/2022 0400   BUN 22 04/13/2021 0856   CREATININE 1.00 02/16/2022 0400   CALCIUM 9.0 02/16/2022 0400   PROT 5.0 (L) 01/29/2022 2338   PROT 6.8 04/13/2021 0856   ALBUMIN 2.9 (L) 01/29/2022 2338   ALBUMIN 4.5 04/13/2021 0856   AST 15 01/29/2022 2338   AST 25  10/17/2017 0937   ALT 16 01/29/2022 2338   ALT 26 10/17/2017 0937   ALKPHOS 39 01/29/2022 2338   BILITOT 0.7 01/29/2022 2338   BILITOT 0.6 04/13/2021 0856   GFRNONAA 59 (L) 02/16/2022 0400   GFRAA 56 (L) 04/11/2020 0924   COAGS Lab Results  Component Value Date   INR 1.1 01/29/2022   Lipid Panel    Component Value Date/Time   CHOL 169 01/30/2022 0639   CHOL 211 (H) 04/13/2021 0856   CHOL 213 (H) 10/17/2017 0937   TRIG 96 01/30/2022  0639   TRIG 110 10/17/2017 0937   HDL 25 (L) 01/30/2022 0639   HDL 41 04/13/2021 0856   CHOLHDL 6.8 01/30/2022 0639   VLDL 19 01/30/2022 0639   VLDL 22 10/17/2017 0937   LDLCALC 125 (H) 01/30/2022 0639   LDLCALC 149 (H) 04/13/2021 0856   HgbA1C  Lab Results  Component Value Date   HGBA1C 5.4 01/29/2022   Urinalysis    Component Value Date/Time   COLORURINE YELLOW 02/04/2022 1528   APPEARANCEUR CLOUDY (A) 02/04/2022 1528   APPEARANCEUR Hazy (A) 07/21/2020 1025   LABSPEC 1.006 02/04/2022 1528   PHURINE 7.0 02/04/2022 1528   GLUCOSEU NEGATIVE 02/04/2022 1528   HGBUR SMALL (A) 02/04/2022 1528   BILIRUBINUR NEGATIVE 02/04/2022 1528   BILIRUBINUR Negative 07/21/2020 1025   KETONESUR NEGATIVE 02/04/2022 1528   PROTEINUR 100 (A) 02/04/2022 1528   NITRITE NEGATIVE 02/04/2022 1528   LEUKOCYTESUR LARGE (A) 02/04/2022 1528   Urine Drug Screen     Component Value Date/Time   LABOPIA NONE DETECTED 01/29/2022 2215   COCAINSCRNUR NONE DETECTED 01/29/2022 2215   LABBENZ NONE DETECTED 01/29/2022 2215   AMPHETMU NONE DETECTED 01/29/2022 2215   THCU NONE DETECTED 01/29/2022 2215   LABBARB NONE DETECTED 01/29/2022 2215    Alcohol Level    Component Value Date/Time   ETH <10 01/29/2022 1107     SIGNIFICANT DIAGNOSTIC STUDIES VAS [place] LOWER EXTREMITY VENOUS (DVT)  Result Date: 02/10/2022  Lower Venous DVT Study Patient Name:  XITLALLY MOONEYHAM  Date of Exam:   02/10/2022 Medical Rec #: [id number]    Accession #:    [id number] Date of Birth: July 24, 1945      Patient Gender: F Patient Age:   29 years Exam [facility]  [facility] Procedure:      VAS [place] LOWER EXTREMITY VENOUS (DVT) Referring Phys: Cornelius Moras XU --------------------------------------------------------------------------------  Indications: Stroke.  Limitations: Poor patient cooperation. Comparison Study: No prior study Performing Technologist: Maudry Mayhew MHA, RDMS, RVT, RDCS  Examination Guidelines: A complete evaluation includes B-mode imaging, spectral Doppler, color Doppler, and power Doppler as needed of all accessible portions of each vessel. Bilateral testing is considered an integral part of a complete examination. Limited examinations for reoccurring indications may be performed as noted. The reflux portion of the exam is performed with the patient in reverse Trendelenburg.  +---------+---------------+---------+-----------+----------+--------------+ RIGHT    CompressibilityPhasicitySpontaneityPropertiesThrombus Aging +---------+---------------+---------+-----------+----------+--------------+ CFV      Full           Yes      Yes                                 +---------+---------------+---------+-----------+----------+--------------+ SFJ      Full                                                        +---------+---------------+---------+-----------+----------+--------------+ FV Prox  Full                                                        +---------+---------------+---------+-----------+----------+--------------+ FV Mid   Full                                                        +---------+---------------+---------+-----------+----------+--------------+  FV DistalFull                                                        +---------+---------------+---------+-----------+----------+--------------+ PFV      Full                                                         +---------+---------------+---------+-----------+----------+--------------+ POP      Full           Yes      Yes                                 +---------+---------------+---------+-----------+----------+--------------+ PTV      Full                                                        +---------+---------------+---------+-----------+----------+--------------+ PERO     Full                                                        +---------+---------------+---------+-----------+----------+--------------+   +---------+---------------+---------+-----------+----------+--------------+ LEFT     CompressibilityPhasicitySpontaneityPropertiesThrombus Aging +---------+---------------+---------+-----------+----------+--------------+ CFV      Full           Yes      Yes                                 +---------+---------------+---------+-----------+----------+--------------+ SFJ      Full                                                        +---------+---------------+---------+-----------+----------+--------------+ FV Prox  Full                                                        +---------+---------------+---------+-----------+----------+--------------+ FV Mid   Full                                                        +---------+---------------+---------+-----------+----------+--------------+ FV DistalFull                                                        +---------+---------------+---------+-----------+----------+--------------+  PFV      Full                                                        +---------+---------------+---------+-----------+----------+--------------+ POP      Full           Yes      Yes                                 +---------+---------------+---------+-----------+----------+--------------+ PTV      Full                                                         +---------+---------------+---------+-----------+----------+--------------+ PERO     Full                                                        +---------+---------------+---------+-----------+----------+--------------+     Summary: RIGHT: - There is no evidence of deep vein thrombosis in the lower extremity.  - No cystic structure found in the popliteal fossa.  LEFT: - There is no evidence of deep vein thrombosis in the lower extremity.  - No cystic structure found in the popliteal fossa.  *See table(s) above for measurements and observations. Electronically signed by Harold Barban MD on 02/10/2022 at 10:37:17 PM.    Final    CT HEAD WO CONTRAST (5MM)  Result Date: 02/10/2022 CLINICAL DATA:  Stroke follow-up EXAM: CT HEAD WITHOUT CONTRAST TECHNIQUE: Contiguous axial images were obtained from the base of the skull through the vertex without intravenous contrast. RADIATION DOSE REDUCTION: This exam was performed according to the departmental dose-optimization program which includes automated exposure control, adjustment of the mA and/or kV according to patient size and/or use of iterative reconstruction technique. COMPARISON:  02/02/2022 FINDINGS: Brain: Evolving infarcts in the right cerebral hemisphere, including in the right inferior frontal gyrus, right basal ganglia and the superior perirolandic cortex. No new hypodensities are visualized to suggest new CT evidence of an infarct. No evidence of hemorrhage. Unchanged size and shape of the ventricular system. No extra-axial fluid collection. Vascular: Redemonstrated are right ICA siphon and right MCA stents. Skull: Normal. Negative for fracture or focal lesion. Sinuses/Orbits: Bilateral lens replacements. No significant sinus disease. Other: Partially visualized nasoenteric tube in place. IMPRESSION: Evolving infarcts in the right cerebral hemisphere. No evidence of hemorrhage or CT evidence of new infarct. Electronically Signed   By: Marin Roberts M.D.    On: 02/10/2022 14:34   DG CHEST PORT 1 VIEW  Result Date: 02/08/2022 CLINICAL DATA:  Leukocytosis EXAM: PORTABLE CHEST 1 VIEW COMPARISON:  None Available. FINDINGS: Cardiac shadow is within normal limits. Feeding catheter extends into the stomach. Aortic calcifications are seen. Right PICC is noted in the superior right atrium. Lungs are clear. Skin fold is noted over the right chest. IMPRESSION: Tubes and lines as described above.  No acute abnormality noted. Electronically Signed   By: Inez Catalina  M.D.   On: 02/08/2022 21:19   IR PERCUTANEOUS ART THROMBECTOMY/INFUSION INTRACRANIAL INC DIAG ANGIO  Result Date: 02/04/2022 INDICATION: Dajha C Shimel is a 76 year old female with a past medical history significant for arthritis, CKD, COPD, Fuch's corneal endothelial dystrophy, HLD, HTN and hypothyroidism. She presented to the ED via EMS as a Code Stroke with acute onset of left sided weakness, left facial droop, dysarthria and neglect; NIHSS 12. Head CT showed early signs of right MCA territory infarct (ASPECTS 8) with calcific focus within the right MCA. Patient received IV TNK. CT angiogram of the head and neck showed a severe stenosis of the proximal right M1/MCA with occlusion of the distal M1 segment. Patient was taken to the radiology holding area for mechanical thrombectomy. However, fast improvement of symptoms was noted and procedure was held. Patient was then taken to MRI which showed severe, high-grade stenosis of the proximal right M1/MCA with patent right MCA vascular tree. A right posterior parietal infarct was also noted. At this point, mechanical thrombectomy was cancelled. However, at 15:30 on the same day (01/29/2022), the RN noticed recurrent symptoms with left-sided weakness and neglect, right gaze preference and left hemianopia, NIHSS 15. Stat head CT showed no evidence of TNK related hemorrhage. Patient was then taken back to the Angio suite for mechanical thrombectomy. EXAM:  ULTRASOUND-GUIDED VASCULAR ACCESS DIAGNOSTIC CEREBRAL ANGIOGRAM MECHANICAL THROMBECTOMY INTRACRANIAL STENTING CERVICAL ANGIOPLASTY AND STENTING ENDOVASCULAR EMBOLIZATION OF CCSF FLAT PANEL HEAD CT (X2) COMPARISON:  CT/CT angiogram of the head and neck January 29, 2022 MEDICATIONS: Cangrelor IV drip and bolus. ANESTHESIA/SEDATION: The procedure was performed under general anesthesia. CONTRAST:  180 mL Omnipaque 300 milligram/mL. FLUOROSCOPY: Radiation Exposure Index (as provided by the fluoroscopic device): PSD 4,888 mGy Kerma COMPLICATIONS: SIR LEVEL C - Requires therapy, minor hospitalization (<48 hrs). TECHNIQUE: Informed written consent was obtained from the patient's husband after a thorough discussion of the procedural risks, benefits and alternatives. All questions were addressed. Maximal Sterile Barrier Technique was utilized including caps, mask, sterile gowns, sterile gloves, sterile drape, hand hygiene and skin antiseptic. A timeout was performed prior to the initiation of the procedure. The right groin was prepped and draped in the usual sterile fashion. Using a micropuncture kit and the modified Seldinger technique, access was gained to the right common femoral artery and an 8 French sheath was placed. Real-time ultrasound guidance was utilized for vascular access including the acquisition of a permanent ultrasound image documenting patency of the accessed vessel. Under fluoroscopy, a Zoom 88 support guide catheter was navigated over a 6 [place] Berenstein 2 catheter and a 0.035" Terumo Glidewire into the aortic arch. The catheter was placed into the right common carotid artery. Frontal and lateral angiograms of the head and neck obtained. FINDINGS: 1. Atherosclerotic changes are noted in the right common femoral artery with preserved caliber, adequate for vascular access. 2. Severe atherosclerotic changes of the right carotid bifurcation with severe, near occlusive stenosis of the proximal right  internal carotid artery. An ulcerated plaque with contrast penetration is noted. 3. Atherosclerotic changes of the intracranial right ICA without hemodynamically significant stenosis. A plaque ulceration versus atherosclerotic aneurysm measuring approximately 2 mm is noted at the petrocavernous junction. 4. Occlusion of the proximal right M1/MCA. PROCEDURE: Using biplane roadmap guidance, the Berenstein 2 was advanced over the wire into the mid cervical segment of the right ICA. The zoom 88 guide catheter was then advanced over the Berenstein into the petrous segment of the right ICA. The [place] was removed and the  zoom 88 was aspirated with fresh appearing clot retrieved. Right internal carotid artery angiograms with frontal and lateral views of the head showed recanalization of the M1 segment with near occlusive filling defect at the site of previous occlusion in the M1 segment. Diffuse intracranial atherosclerotic disease is seen. Occlusion of distal M3/MCA branches are noted in the anterior and posterior divisions. Nonocclusive filling defect in the dominant distal callosal marginal artery. Using biplane roadmap guidance, a zoom 71 aspiration catheter was navigated over Colossus 35 microguidewire into the cavernous segment of the right ICA. The aspiration catheter was then advanced to the level of occlusion and connected to an aspiration pump. Continuous aspiration was performed for 2 minutes. The guide catheter was connected to a VacLok syringe. The aspiration catheter was subsequently removed under constant aspiration. The guide catheter was aspirated for debris. Right internal carotid artery angiograms showed improvement of the length of the filling defect within the M1 segment with underlying atherosclerotic plaque and severe stenosis. Occlusion of a superior division M3 segment is seen. The posterior division M3 segment appear recanalized with persistent occlusion in an M4 segment in the posterior  parietal region. Using biplane roadmap guidance, a zoom 71 aspiration catheter was navigated over a phenom 21 microcatheter and an Aristotle 14 microguidewire into the cavernous segment of the right ICA. The microcatheter was then navigated over the wire into the M3 anterior division branch. Then, a 3 x 40 mm solitaire stent retriever was deployed spanning the M2 and M3 segment. The device was allowed to intercalated with the clot for 4 minutes. The microcatheter was removed. The aspiration catheter was advanced to the distal M1 segment and connected to an aspiration pump. The thrombectomy device and aspiration catheter were removed under constant aspiration. The guide catheter was aspirated for debris. Right internal carotid artery angiograms showed recanalization of the right M3/MCA anterior division branch with vasospasm and slow flow. Right anterior oblique and lateral angiograms of entire head showed additional occlusion of the distal callosum marginal artery and an A5/ACA branch. Flat panel CT of the head was obtained and post processed in a separate workstation with concurrent attending physician supervision. Selected images were sent to PACS. Contrast staining is noted in the paramedian right frontal cortex and right basal ganglia, suggestive of ongoing ischemia. Trace hyperdensity noted in the right sylvian fissure. Hyperdense foci noted in the right M1 and M2 segments. Frontal and lateral angiograms of the head showed filling defect in the M1/MCA and M2/MCA corresponding to the hyperdense foci noted on flat panel CT. The zoom 88 catheter was retracted into the right common carotid artery. Frontal and lateral angiograms of the neck were obtained revealing persistent high-grade stenosis at the right carotid bifurcation. A 4 x 30 mm Viatrac balloon was navigated over a synchro select 14 microwire into the right carotid bifurcation. Angioplasty was performed under fluoroscopy. Frontal and lateral angiograms of  the neck showed no significant improvement of the degree of stenosis. Then, a 6 x 30 Viatrac balloon was navigated to the level of the carotid bifurcation. Angioplasty was performed under fluoroscopy. Cervical angiograms with frontal and lateral views showed improvement of the stenosis with moderate residual stenosis. Follow-up frontal and lateral angiograms of the head showed worsening right M1/MCA stenosis and reocclusion of the superior and division branches. Using biplane roadmap guidance, a zoom 71 aspiration catheter was navigated over a Trak 21 microcatheter and an synchro select 14 microguidewire into the cavernous segment of the right ICA. The microcatheter was then  navigated over the wire into the M2/MCA posterior division branch. Then, a 3 mm solitaire stent retriever was deployed spanning the M2 and M3 segment. The device was allowed to intercalated with the clot for 4 minutes. The microcatheter was removed. The aspiration catheter was advanced to the level of occlusion and connected to an aspiration pump. The thrombectomy device and aspiration catheter were removed under constant aspiration. The guide catheter was aspirated for debris. Right internal carotid artery angiogram showed recanalization of the posterior division branch. Worsening stenosis of the M1 segment seen. Persistent occlusion of the distal callosum marginal artery and anterior division. Flat panel CT of the head was obtained and post processed in a separate workstation with concurrent attending physician supervision. Selected images were sent to PACS. Stable contrast staining is noted in the paramedian right frontal cortex and right basal ganglia and trace hyperdensity noted in the right sylvian fissure. Right common carotid artery angiograms with frontal and lateral views of the neck showed persistent moderate stenosis at the proximal right ICA. The zoom 88 catheter was again advanced into the distal cervical segment of the right  internal carotid artery. Frontal and frog lateral right ICA angiograms were obtained. Persistent occlusion of the right MCA anterior division branch and distal callosum marginal artery is seen. Using biplane roadmap guidance, a zoom 71 aspiration catheter was navigated over a phenom 21 microcatheter and an Aristotle 14 microguidewire into the cavernous segment of the right ICA. The microcatheter was then navigated over the wire into the right M3/MCA anterior division branch. Then, a 3 mm solitaire stent retriever was deployed spanning the M2 and M3 segments. The device was allowed to intercalated with the clot for 4 minutes. The microcatheter was removed. The aspiration catheter was advanced to the level of occlusion and connected to an aspiration pump. The thrombectomy device and aspiration catheter were removed under constant aspiration. The guide catheter was aspirated for debris. Right internal carotid artery angiograms with frontal and lateral views of the head showed recanalization of the anterior division. The nonocclusive filling defect in the posterior division M2 segment appears significantly decreased in size with minimal residual filling defect. Distal flow is noted now in the callosum marginal branch with persistent nonocclusive filling defect. Persistent M1 stenosis is seen. Delayed angiograms showed persistent patency in the anterior division with slow flow in an M3 branch. Persistent nonocclusive filling defect in the callosum marginal artery. At this point, patient was loaded on Cangrelor. Then, a 2 x 25 x 12 mm resolute onyx stent was navigated and deployed in the right M1/MCA segment, at the level of the stenosis. Magnified frontal and lateral angiograms of the head showed appropriate stent position in the M1 segment with complete resolution of the stenosis. 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 Then, a  phenom Plus distal access catheter was navigated over a phenom 27 microcatheter and a synchro select 14 microguidewire into the right M1/MCA segment. Then, a 5 x 35 mm pipeline flex flow diverter was deployed spanning the petrous and cavernous segments. Frontal and lateral angiograms of the head showed adequate position of the flow diverter with persistent carotid cavernous fistula. Then, a 5 x 25 pipeline flex flow diverter was deployed in a telescoping fashion within the previously the void flow diverter at the petrocavernous segment. Magnified frontal and lateral angiograms showed adequate position of the flow diverter construct. Frontal and lateral angiograms showed persistent carotid cavernous fistula. Frontal  and lateral angiograms of the brain showed complete patency of the right MCA vascular tree. Persistent filling defect in the distal callosum marginal artery with brisk distal flow. Next, the zoom 88 guide catheter was retracted into the right common carotid artery. Frontal and lateral angiograms of the neck were obtained. Then, an 8-6 x 40 mm Xact carotid stent was deployed across the area of stenosis in the carotid bifurcation/proximal right ICA. Frontal and lateral angiograms of the neck showed adequate stent positioning with complete resolution of the stenosis. Contrast stagnation is seen within the plaque ulceration. Follow-up right internal carotid artery angiograms with frontal lateral views of the head showed no evidence of thromboembolic complication. Delay cervical angiograms showed no evidence of clot formation within the recently deployed carotid stent. Flat panel CT of the head was obtained and post processed in a separate workstation with concurrent attending physician supervision. Selected images were sent to PACS. Stable findings as compared to prior intra procedural flat panel CT. The catheter was subsequently withdrawn. Right common femoral artery angiogram was obtained in right anterior  oblique view. The puncture is at the level of the common femoral artery. The artery has normal caliber, adequate for closure device. The sheath was exchanged over the wire for a Perclose prostyle which was utilized for access closure. Immediate hemostasis was achieved. IMPRESSION: 1. Successful mechanical thrombectomy and stenting for treatment of a right M1/MCA occlusion with underline atherosclerotic stenosis. 2. Successful angioplasty and stenting of the cervical right ICA for treatment of severe, near occlusive stenosis. 3. 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 Persistent flow through the fistula noted. 4. Postprocedural flat panel CT showing contrast staining is noted in the paramedian right frontal cortex and right basal ganglia, suggestive of ongoing ischemia. PLAN: 1. Patient extubated and sent to ICU in stable condition. 2. Carotid cavernous sinus fistula to be readdressed once clinically appropriate. Electronically Signed   By: Pedro Earls M.D.   On: 02/04/2022 15:36   IR US Guide Vasc Access Right  Result Date: 02/04/2022 INDICATION: Geneveive C Dunnaway is a 76 year old female with a past medical history significant for arthritis, CKD, COPD, Fuch's corneal endothelial dystrophy, HLD, HTN and hypothyroidism. She presented to the ED via EMS as a Code Stroke with acute onset of left sided weakness, left facial droop, dysarthria and neglect; NIHSS 12. Head CT showed early signs of right MCA territory infarct (ASPECTS 8) with calcific focus within the right MCA. Patient received IV TNK. CT angiogram of the head and neck showed a severe stenosis of the proximal right M1/MCA with occlusion of the distal M1 segment. Patient was taken to the radiology holding area for mechanical thrombectomy. However, fast improvement of symptoms was noted and  procedure was held. Patient was then taken to MRI which showed severe, high-grade stenosis of the proximal right M1/MCA with patent right MCA vascular tree. A right posterior parietal infarct was also noted. At this point, mechanical thrombectomy was cancelled. However, at 15:30 on the same day (01/29/2022), the RN noticed recurrent symptoms with left-sided weakness and neglect, right gaze preference and left hemianopia, NIHSS 15. Stat head CT showed no evidence of TNK related hemorrhage. Patient was then taken back to the Angio suite for mechanical thrombectomy. EXAM: ULTRASOUND-GUIDED VASCULAR ACCESS DIAGNOSTIC CEREBRAL ANGIOGRAM MECHANICAL THROMBECTOMY INTRACRANIAL STENTING CERVICAL ANGIOPLASTY AND STENTING ENDOVASCULAR EMBOLIZATION OF CCSF FLAT  PANEL HEAD CT (X2) COMPARISON:  CT/CT angiogram of the head and neck January 29, 2022 MEDICATIONS: Cangrelor IV drip and bolus. ANESTHESIA/SEDATION: The procedure was performed under general anesthesia. CONTRAST:  180 mL Omnipaque 300 milligram/mL. FLUOROSCOPY: Radiation Exposure Index (as provided by the fluoroscopic device): PSD 2,952 mGy Kerma COMPLICATIONS: SIR LEVEL C - Requires therapy, minor hospitalization (<48 hrs). TECHNIQUE: Informed written consent was obtained from the patient's husband after a thorough discussion of the procedural risks, benefits and alternatives. All questions were addressed. Maximal Sterile Barrier Technique was utilized including caps, mask, sterile gowns, sterile gloves, sterile drape, hand hygiene and skin antiseptic. A timeout was performed prior to the initiation of the procedure. The right groin was prepped and draped in the usual sterile fashion. Using a micropuncture kit and the modified Seldinger technique, access was gained to the right common femoral artery and an 8 French sheath was placed. Real-time ultrasound guidance was utilized for vascular access including the acquisition of a permanent ultrasound image documenting  patency of the accessed vessel. Under fluoroscopy, a Zoom 88 support guide catheter was navigated over a 6 [place] Berenstein 2 catheter and a 0.035" Terumo Glidewire into the aortic arch. The catheter was placed into the right common carotid artery. Frontal and lateral angiograms of the head and neck obtained. FINDINGS: 1. Atherosclerotic changes are noted in the right common femoral artery with preserved caliber, adequate for vascular access. 2. Severe atherosclerotic changes of the right carotid bifurcation with severe, near occlusive stenosis of the proximal right internal carotid artery. An ulcerated plaque with contrast penetration is noted. 3. Atherosclerotic changes of the intracranial right ICA without hemodynamically significant stenosis. A plaque ulceration versus atherosclerotic aneurysm measuring approximately 2 mm is noted at the petrocavernous junction. 4. Occlusion of the proximal right M1/MCA. PROCEDURE: Using biplane roadmap guidance, the Berenstein 2 was advanced over the wire into the mid cervical segment of the right ICA. The zoom 88 guide catheter was then advanced over the Berenstein into the petrous segment of the right ICA. The [place] was removed and the zoom 88 was aspirated with fresh appearing clot retrieved. Right internal carotid artery angiograms with frontal and lateral views of the head showed recanalization of the M1 segment with near occlusive filling defect at the site of previous occlusion in the M1 segment. Diffuse intracranial atherosclerotic disease is seen. Occlusion of distal M3/MCA branches are noted in the anterior and posterior divisions. Nonocclusive filling defect in the dominant distal callosal marginal artery. Using biplane roadmap guidance, a zoom 71 aspiration catheter was navigated over Colossus 35 microguidewire into the cavernous segment of the right ICA. The aspiration catheter was then advanced to the level of occlusion and connected to an aspiration pump.  Continuous aspiration was performed for 2 minutes. The guide catheter was connected to a VacLok syringe. The aspiration catheter was subsequently removed under constant aspiration. The guide catheter was aspirated for debris. Right internal carotid artery angiograms showed improvement of the length of the filling defect within the M1 segment with underlying atherosclerotic plaque and severe stenosis. Occlusion of a superior division M3 segment is seen. The posterior division M3 segment appear recanalized with persistent occlusion in an M4 segment in the posterior parietal region. Using biplane roadmap guidance, a zoom 71 aspiration catheter was navigated over a phenom 21 microcatheter and an Aristotle 14 microguidewire into the cavernous segment of the right ICA. The microcatheter was then navigated over the wire into the M3 anterior division branch. Then, a 3 x 40  mm solitaire stent retriever was deployed spanning the M2 and M3 segment. The device was allowed to intercalated with the clot for 4 minutes. The microcatheter was removed. The aspiration catheter was advanced to the distal M1 segment and connected to an aspiration pump. The thrombectomy device and aspiration catheter were removed under constant aspiration. The guide catheter was aspirated for debris. Right internal carotid artery angiograms showed recanalization of the right M3/MCA anterior division branch with vasospasm and slow flow. Right anterior oblique and lateral angiograms of entire head showed additional occlusion of the distal callosum marginal artery and an A5/ACA branch. Flat panel CT of the head was obtained and post processed in a separate workstation with concurrent attending physician supervision. Selected images were sent to PACS. Contrast staining is noted in the paramedian right frontal cortex and right basal ganglia, suggestive of ongoing ischemia. Trace hyperdensity noted in the right sylvian fissure. Hyperdense foci noted in the  right M1 and M2 segments. Frontal and lateral angiograms of the head showed filling defect in the M1/MCA and M2/MCA corresponding to the hyperdense foci noted on flat panel CT. The zoom 88 catheter was retracted into the right common carotid artery. Frontal and lateral angiograms of the neck were obtained revealing persistent high-grade stenosis at the right carotid bifurcation. A 4 x 30 mm Viatrac balloon was navigated over a synchro select 14 microwire into the right carotid bifurcation. Angioplasty was performed under fluoroscopy. Frontal and lateral angiograms of the neck showed no significant improvement of the degree of stenosis. Then, a 6 x 30 Viatrac balloon was navigated to the level of the carotid bifurcation. Angioplasty was performed under fluoroscopy. Cervical angiograms with frontal and lateral views showed improvement of the stenosis with moderate residual stenosis. Follow-up frontal and lateral angiograms of the head showed worsening right M1/MCA stenosis and reocclusion of the superior and division branches. Using biplane roadmap guidance, a zoom 71 aspiration catheter was navigated over a Trak 21 microcatheter and an synchro select 14 microguidewire into the cavernous segment of the right ICA. The microcatheter was then navigated over the wire into the M2/MCA posterior division branch. Then, a 3 mm solitaire stent retriever was deployed spanning the M2 and M3 segment. The device was allowed to intercalated with the clot for 4 minutes. The microcatheter was removed. The aspiration catheter was advanced to the level of occlusion and connected to an aspiration pump. The thrombectomy device and aspiration catheter were removed under constant aspiration. The guide catheter was aspirated for debris. Right internal carotid artery angiogram showed recanalization of the posterior division branch. Worsening stenosis of the M1 segment seen. Persistent occlusion of the distal callosum marginal artery and  anterior division. Flat panel CT of the head was obtained and post processed in a separate workstation with concurrent attending physician supervision. Selected images were sent to PACS. Stable contrast staining is noted in the paramedian right frontal cortex and right basal ganglia and trace hyperdensity noted in the right sylvian fissure. Right common carotid artery angiograms with frontal and lateral views of the neck showed persistent moderate stenosis at the proximal right ICA. The zoom 88 catheter was again advanced into the distal cervical segment of the right internal carotid artery. Frontal and frog lateral right ICA angiograms were obtained. Persistent occlusion of the right MCA anterior division branch and distal callosum marginal artery is seen. Using biplane roadmap guidance, a zoom 71 aspiration catheter was navigated over a phenom 21 microcatheter and an Aristotle 14 microguidewire into the cavernous segment  of the right ICA. The microcatheter was then navigated over the wire into the right M3/MCA anterior division branch. Then, a 3 mm solitaire stent retriever was deployed spanning the M2 and M3 segments. The device was allowed to intercalated with the clot for 4 minutes. The microcatheter was removed. The aspiration catheter was advanced to the level of occlusion and connected to an aspiration pump. The thrombectomy device and aspiration catheter were removed under constant aspiration. The guide catheter was aspirated for debris. Right internal carotid artery angiograms with frontal and lateral views of the head showed recanalization of the anterior division. The nonocclusive filling defect in the posterior division M2 segment appears significantly decreased in size with minimal residual filling defect. Distal flow is noted now in the callosum marginal branch with persistent nonocclusive filling defect. Persistent M1 stenosis is seen. Delayed angiograms showed persistent patency in the anterior  division with slow flow in an M3 branch. Persistent nonocclusive filling defect in the callosum marginal artery. At this point, patient was loaded on Cangrelor. Then, a 2 x 25 x 12 mm resolute onyx stent was navigated and deployed in the right M1/MCA segment, at the level of the stenosis. Magnified frontal and lateral angiograms of the head showed appropriate stent position in the M1 segment with complete resolution of the stenosis. 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 Then, a phenom Plus distal access catheter was navigated over a phenom 27 microcatheter and a synchro select 14 microguidewire into the right M1/MCA segment. Then, a 5 x 35 mm pipeline flex flow diverter was deployed spanning the petrous and cavernous segments. Frontal and lateral angiograms of the head showed adequate position of the flow diverter with persistent carotid cavernous fistula. Then, a 5 x 25 pipeline flex flow diverter was deployed in a telescoping fashion within the previously the void flow diverter at the petrocavernous segment. Magnified frontal and lateral angiograms showed adequate position of the flow diverter construct. Frontal and lateral angiograms showed persistent carotid cavernous fistula. Frontal and lateral angiograms of the brain showed complete patency of the right MCA vascular tree. Persistent filling defect in the distal callosum marginal artery with brisk distal flow. Next, the zoom 88 guide catheter was retracted into the right common carotid artery. Frontal and lateral angiograms of the neck were obtained. Then, an 8-6 x 40 mm Xact carotid stent was deployed across the area of stenosis in the carotid bifurcation/proximal right ICA. Frontal and lateral angiograms of the neck showed adequate stent positioning with complete resolution of the stenosis. Contrast stagnation is seen within the plaque ulceration. Follow-up right  internal carotid artery angiograms with frontal lateral views of the head showed no evidence of thromboembolic complication. Delay cervical angiograms showed no evidence of clot formation within the recently deployed carotid stent. Flat panel CT of the head was obtained and post processed in a separate workstation with concurrent attending physician supervision. Selected images were sent to PACS. Stable findings as compared to prior intra procedural flat panel CT. The catheter was subsequently withdrawn. Right common femoral artery angiogram was obtained in right anterior oblique view. The puncture is at the level of the common femoral artery. The artery has normal caliber, adequate for closure device. The sheath was exchanged over the wire for a Perclose prostyle which was utilized for access closure. Immediate hemostasis was achieved. IMPRESSION: 1. Successful mechanical thrombectomy and stenting for treatment of a right M1/MCA occlusion with underline atherosclerotic stenosis.  2. Successful angioplasty and stenting of the cervical right ICA for treatment of severe, near occlusive stenosis. 3. 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 Persistent flow through the fistula noted. 4. Postprocedural flat panel CT showing contrast staining is noted in the paramedian right frontal cortex and right basal ganglia, suggestive of ongoing ischemia. PLAN: 1. Patient extubated and sent to ICU in stable condition. 2. Carotid cavernous sinus fistula to be readdressed once clinically appropriate. Electronically Signed   By: Pedro Earls M.D.   On: 02/04/2022 15:36   IR CT Head Ltd  Result Date: 02/04/2022 INDICATION: Dreana C Emery is a 76 year old female with a past medical history significant for arthritis, CKD, COPD, Fuch's corneal endothelial dystrophy, HLD,  HTN and hypothyroidism. She presented to the ED via EMS as a Code Stroke with acute onset of left sided weakness, left facial droop, dysarthria and neglect; NIHSS 12. Head CT showed early signs of right MCA territory infarct (ASPECTS 8) with calcific focus within the right MCA. Patient received IV TNK. CT angiogram of the head and neck showed a severe stenosis of the proximal right M1/MCA with occlusion of the distal M1 segment. Patient was taken to the radiology holding area for mechanical thrombectomy. However, fast improvement of symptoms was noted and procedure was held. Patient was then taken to MRI which showed severe, high-grade stenosis of the proximal right M1/MCA with patent right MCA vascular tree. A right posterior parietal infarct was also noted. At this point, mechanical thrombectomy was cancelled. However, at 15:30 on the same day (01/29/2022), the RN noticed recurrent symptoms with left-sided weakness and neglect, right gaze preference and left hemianopia, NIHSS 15. Stat head CT showed no evidence of TNK related hemorrhage. Patient was then taken back to the Angio suite for mechanical thrombectomy. EXAM: ULTRASOUND-GUIDED VASCULAR ACCESS DIAGNOSTIC CEREBRAL ANGIOGRAM MECHANICAL THROMBECTOMY INTRACRANIAL STENTING CERVICAL ANGIOPLASTY AND STENTING ENDOVASCULAR EMBOLIZATION OF CCSF FLAT PANEL HEAD CT (X2) COMPARISON:  CT/CT angiogram of the head and neck January 29, 2022 MEDICATIONS: Cangrelor IV drip and bolus. ANESTHESIA/SEDATION: The procedure was performed under general anesthesia. CONTRAST:  180 mL Omnipaque 300 milligram/mL. FLUOROSCOPY: Radiation Exposure Index (as provided by the fluoroscopic device): PSD 8,119 mGy Kerma COMPLICATIONS: SIR LEVEL C - Requires therapy, minor hospitalization (<48 hrs). TECHNIQUE: Informed written consent was obtained from the patient's husband after a thorough discussion of the procedural risks, benefits and alternatives. All questions were addressed. Maximal  Sterile Barrier Technique was utilized including caps, mask, sterile gowns, sterile gloves, sterile drape, hand hygiene and skin antiseptic. A timeout was performed prior to the initiation of the procedure. The right groin was prepped and draped in the usual sterile fashion. Using a micropuncture kit and the modified Seldinger technique, access was gained to the right common femoral artery and an 8 French sheath was placed. Real-time ultrasound guidance was utilized for vascular access including the acquisition of a permanent ultrasound image documenting patency of the accessed vessel. Under fluoroscopy, a Zoom 88 support guide catheter was navigated over a 6 [place] Berenstein 2 catheter and a 0.035" Terumo Glidewire into the aortic arch. The catheter was placed into the right common carotid artery. Frontal and lateral angiograms of the head and neck obtained. FINDINGS: 1. Atherosclerotic changes are noted in the right common femoral artery with preserved caliber, adequate for vascular access. 2. Severe atherosclerotic changes of  the right carotid bifurcation with severe, near occlusive stenosis of the proximal right internal carotid artery. An ulcerated plaque with contrast penetration is noted. 3. Atherosclerotic changes of the intracranial right ICA without hemodynamically significant stenosis. A plaque ulceration versus atherosclerotic aneurysm measuring approximately 2 mm is noted at the petrocavernous junction. 4. Occlusion of the proximal right M1/MCA. PROCEDURE: Using biplane roadmap guidance, the Berenstein 2 was advanced over the wire into the mid cervical segment of the right ICA. The zoom 88 guide catheter was then advanced over the Berenstein into the petrous segment of the right ICA. The [place] was removed and the zoom 88 was aspirated with fresh appearing clot retrieved. Right internal carotid artery angiograms with frontal and lateral views of the head showed recanalization of the M1 segment with  near occlusive filling defect at the site of previous occlusion in the M1 segment. Diffuse intracranial atherosclerotic disease is seen. Occlusion of distal M3/MCA branches are noted in the anterior and posterior divisions. Nonocclusive filling defect in the dominant distal callosal marginal artery. Using biplane roadmap guidance, a zoom 71 aspiration catheter was navigated over Colossus 35 microguidewire into the cavernous segment of the right ICA. The aspiration catheter was then advanced to the level of occlusion and connected to an aspiration pump. Continuous aspiration was performed for 2 minutes. The guide catheter was connected to a VacLok syringe. The aspiration catheter was subsequently removed under constant aspiration. The guide catheter was aspirated for debris. Right internal carotid artery angiograms showed improvement of the length of the filling defect within the M1 segment with underlying atherosclerotic plaque and severe stenosis. Occlusion of a superior division M3 segment is seen. The posterior division M3 segment appear recanalized with persistent occlusion in an M4 segment in the posterior parietal region. Using biplane roadmap guidance, a zoom 71 aspiration catheter was navigated over a phenom 21 microcatheter and an Aristotle 14 microguidewire into the cavernous segment of the right ICA. The microcatheter was then navigated over the wire into the M3 anterior division branch. Then, a 3 x 40 mm solitaire stent retriever was deployed spanning the M2 and M3 segment. The device was allowed to intercalated with the clot for 4 minutes. The microcatheter was removed. The aspiration catheter was advanced to the distal M1 segment and connected to an aspiration pump. The thrombectomy device and aspiration catheter were removed under constant aspiration. The guide catheter was aspirated for debris. Right internal carotid artery angiograms showed recanalization of the right M3/MCA anterior division branch  with vasospasm and slow flow. Right anterior oblique and lateral angiograms of entire head showed additional occlusion of the distal callosum marginal artery and an A5/ACA branch. Flat panel CT of the head was obtained and post processed in a separate workstation with concurrent attending physician supervision. Selected images were sent to PACS. Contrast staining is noted in the paramedian right frontal cortex and right basal ganglia, suggestive of ongoing ischemia. Trace hyperdensity noted in the right sylvian fissure. Hyperdense foci noted in the right M1 and M2 segments. Frontal and lateral angiograms of the head showed filling defect in the M1/MCA and M2/MCA corresponding to the hyperdense foci noted on flat panel CT. The zoom 88 catheter was retracted into the right common carotid artery. Frontal and lateral angiograms of the neck were obtained revealing persistent high-grade stenosis at the right carotid bifurcation. A 4 x 30 mm Viatrac balloon was navigated over a synchro select 14 microwire into the right carotid bifurcation. Angioplasty was performed under fluoroscopy. Frontal and  lateral angiograms of the neck showed no significant improvement of the degree of stenosis. Then, a 6 x 30 Viatrac balloon was navigated to the level of the carotid bifurcation. Angioplasty was performed under fluoroscopy. Cervical angiograms with frontal and lateral views showed improvement of the stenosis with moderate residual stenosis. Follow-up frontal and lateral angiograms of the head showed worsening right M1/MCA stenosis and reocclusion of the superior and division branches. Using biplane roadmap guidance, a zoom 71 aspiration catheter was navigated over a Trak 21 microcatheter and an synchro select 14 microguidewire into the cavernous segment of the right ICA. The microcatheter was then navigated over the wire into the M2/MCA posterior division branch. Then, a 3 mm solitaire stent retriever was deployed spanning the M2  and M3 segment. The device was allowed to intercalated with the clot for 4 minutes. The microcatheter was removed. The aspiration catheter was advanced to the level of occlusion and connected to an aspiration pump. The thrombectomy device and aspiration catheter were removed under constant aspiration. The guide catheter was aspirated for debris. Right internal carotid artery angiogram showed recanalization of the posterior division branch. Worsening stenosis of the M1 segment seen. Persistent occlusion of the distal callosum marginal artery and anterior division. Flat panel CT of the head was obtained and post processed in a separate workstation with concurrent attending physician supervision. Selected images were sent to PACS. Stable contrast staining is noted in the paramedian right frontal cortex and right basal ganglia and trace hyperdensity noted in the right sylvian fissure. Right common carotid artery angiograms with frontal and lateral views of the neck showed persistent moderate stenosis at the proximal right ICA. The zoom 88 catheter was again advanced into the distal cervical segment of the right internal carotid artery. Frontal and frog lateral right ICA angiograms were obtained. Persistent occlusion of the right MCA anterior division branch and distal callosum marginal artery is seen. Using biplane roadmap guidance, a zoom 71 aspiration catheter was navigated over a phenom 21 microcatheter and an Aristotle 14 microguidewire into the cavernous segment of the right ICA. The microcatheter was then navigated over the wire into the right M3/MCA anterior division branch. Then, a 3 mm solitaire stent retriever was deployed spanning the M2 and M3 segments. The device was allowed to intercalated with the clot for 4 minutes. The microcatheter was removed. The aspiration catheter was advanced to the level of occlusion and connected to an aspiration pump. The thrombectomy device and aspiration catheter were removed  under constant aspiration. The guide catheter was aspirated for debris. Right internal carotid artery angiograms with frontal and lateral views of the head showed recanalization of the anterior division. The nonocclusive filling defect in the posterior division M2 segment appears significantly decreased in size with minimal residual filling defect. Distal flow is noted now in the callosum marginal branch with persistent nonocclusive filling defect. Persistent M1 stenosis is seen. Delayed angiograms showed persistent patency in the anterior division with slow flow in an M3 branch. Persistent nonocclusive filling defect in the callosum marginal artery. At this point, patient was loaded on Cangrelor. Then, a 2 x 25 x 12 mm resolute onyx stent was navigated and deployed in the right M1/MCA segment, at the level of the stenosis. Magnified frontal and lateral angiograms of the head showed appropriate stent position in the M1 segment with complete resolution of the stenosis. 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 Then, a phenom Plus distal  access catheter was navigated over a phenom 27 microcatheter and a synchro select 14 microguidewire into the right M1/MCA segment. Then, a 5 x 35 mm pipeline flex flow diverter was deployed spanning the petrous and cavernous segments. Frontal and lateral angiograms of the head showed adequate position of the flow diverter with persistent carotid cavernous fistula. Then, a 5 x 25 pipeline flex flow diverter was deployed in a telescoping fashion within the previously the void flow diverter at the petrocavernous segment. Magnified frontal and lateral angiograms showed adequate position of the flow diverter construct. Frontal and lateral angiograms showed persistent carotid cavernous fistula. Frontal and lateral angiograms of the brain showed complete patency of the right MCA vascular tree. Persistent  filling defect in the distal callosum marginal artery with brisk distal flow. Next, the zoom 88 guide catheter was retracted into the right common carotid artery. Frontal and lateral angiograms of the neck were obtained. Then, an 8-6 x 40 mm Xact carotid stent was deployed across the area of stenosis in the carotid bifurcation/proximal right ICA. Frontal and lateral angiograms of the neck showed adequate stent positioning with complete resolution of the stenosis. Contrast stagnation is seen within the plaque ulceration. Follow-up right internal carotid artery angiograms with frontal lateral views of the head showed no evidence of thromboembolic complication. Delay cervical angiograms showed no evidence of clot formation within the recently deployed carotid stent. Flat panel CT of the head was obtained and post processed in a separate workstation with concurrent attending physician supervision. Selected images were sent to PACS. Stable findings as compared to prior intra procedural flat panel CT. The catheter was subsequently withdrawn. Right common femoral artery angiogram was obtained in right anterior oblique view. The puncture is at the level of the common femoral artery. The artery has normal caliber, adequate for closure device. The sheath was exchanged over the wire for a Perclose prostyle which was utilized for access closure. Immediate hemostasis was achieved. IMPRESSION: 1. Successful mechanical thrombectomy and stenting for treatment of a right M1/MCA occlusion with underline atherosclerotic stenosis. 2. Successful angioplasty and stenting of the cervical right ICA for treatment of severe, near occlusive stenosis. 3. 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 Persistent flow through the fistula noted. 4. Postprocedural flat panel CT showing contrast  staining is noted in the paramedian right frontal cortex and right basal ganglia, suggestive of ongoing ischemia. PLAN: 1. Patient extubated and sent to ICU in stable condition. 2. Carotid cavernous sinus fistula to be readdressed once clinically appropriate. Electronically Signed   By: Pedro Earls M.D.   On: 02/04/2022 15:36   IR CT Head Ltd  Result Date: 02/04/2022 INDICATION: Carnella C Ryback is a 76 year old female with a past medical history significant for arthritis, CKD, COPD, Fuch's corneal endothelial dystrophy, HLD, HTN and hypothyroidism. She presented to the ED via EMS as a Code Stroke with acute onset of left sided weakness, left facial droop, dysarthria and neglect; NIHSS 12. Head CT showed early signs of right MCA territory infarct (ASPECTS 8) with calcific focus within the right MCA. Patient received IV TNK. CT angiogram of the head and neck showed a severe stenosis of the proximal right M1/MCA with occlusion of the distal M1 segment. Patient was taken to the radiology holding area for mechanical thrombectomy. However, fast improvement of symptoms was noted and procedure was held. Patient  was then taken to MRI which showed severe, high-grade stenosis of the proximal right M1/MCA with patent right MCA vascular tree. A right posterior parietal infarct was also noted. At this point, mechanical thrombectomy was cancelled. However, at 15:30 on the same day (01/29/2022), the RN noticed recurrent symptoms with left-sided weakness and neglect, right gaze preference and left hemianopia, NIHSS 15. Stat head CT showed no evidence of TNK related hemorrhage. Patient was then taken back to the Angio suite for mechanical thrombectomy. EXAM: ULTRASOUND-GUIDED VASCULAR ACCESS DIAGNOSTIC CEREBRAL ANGIOGRAM MECHANICAL THROMBECTOMY INTRACRANIAL STENTING CERVICAL ANGIOPLASTY AND STENTING ENDOVASCULAR EMBOLIZATION OF CCSF FLAT PANEL HEAD CT (X2) COMPARISON:  CT/CT angiogram of the head and neck January 29, 2022 MEDICATIONS: Cangrelor IV drip and bolus. ANESTHESIA/SEDATION: The procedure was performed under general anesthesia. CONTRAST:  180 mL Omnipaque 300 milligram/mL. FLUOROSCOPY: Radiation Exposure Index (as provided by the fluoroscopic device): PSD 7,619 mGy Kerma COMPLICATIONS: SIR LEVEL C - Requires therapy, minor hospitalization (<48 hrs). TECHNIQUE: Informed written consent was obtained from the patient's husband after a thorough discussion of the procedural risks, benefits and alternatives. All questions were addressed. Maximal Sterile Barrier Technique was utilized including caps, mask, sterile gowns, sterile gloves, sterile drape, hand hygiene and skin antiseptic. A timeout was performed prior to the initiation of the procedure. The right groin was prepped and draped in the usual sterile fashion. Using a micropuncture kit and the modified Seldinger technique, access was gained to the right common femoral artery and an 8 French sheath was placed. Real-time ultrasound guidance was utilized for vascular access including the acquisition of a permanent ultrasound image documenting patency of the accessed vessel. Under fluoroscopy, a Zoom 88 support guide catheter was navigated over a 6 [place] Berenstein 2 catheter and a 0.035" Terumo Glidewire into the aortic arch. The catheter was placed into the right common carotid artery. Frontal and lateral angiograms of the head and neck obtained. FINDINGS: 1. Atherosclerotic changes are noted in the right common femoral artery with preserved caliber, adequate for vascular access. 2. Severe atherosclerotic changes of the right carotid bifurcation with severe, near occlusive stenosis of the proximal right internal carotid artery. An ulcerated plaque with contrast penetration is noted. 3. Atherosclerotic changes of the intracranial right ICA without hemodynamically significant stenosis. A plaque ulceration versus atherosclerotic aneurysm measuring approximately 2 mm is  noted at the petrocavernous junction. 4. Occlusion of the proximal right M1/MCA. PROCEDURE: Using biplane roadmap guidance, the Berenstein 2 was advanced over the wire into the mid cervical segment of the right ICA. The zoom 88 guide catheter was then advanced over the Berenstein into the petrous segment of the right ICA. The [place] was removed and the zoom 88 was aspirated with fresh appearing clot retrieved. Right internal carotid artery angiograms with frontal and lateral views of the head showed recanalization of the M1 segment with near occlusive filling defect at the site of previous occlusion in the M1 segment. Diffuse intracranial atherosclerotic disease is seen. Occlusion of distal M3/MCA branches are noted in the anterior and posterior divisions. Nonocclusive filling defect in the dominant distal callosal marginal artery. Using biplane roadmap guidance, a zoom 71 aspiration catheter was navigated over Colossus 35 microguidewire into the cavernous segment of the right ICA. The aspiration catheter was then advanced to the level of occlusion and connected to an aspiration pump. Continuous aspiration was performed for 2 minutes. The guide catheter was connected to a VacLok syringe. The aspiration catheter was subsequently removed under constant aspiration. The guide catheter was  aspirated for debris. Right internal carotid artery angiograms showed improvement of the length of the filling defect within the M1 segment with underlying atherosclerotic plaque and severe stenosis. Occlusion of a superior division M3 segment is seen. The posterior division M3 segment appear recanalized with persistent occlusion in an M4 segment in the posterior parietal region. Using biplane roadmap guidance, a zoom 71 aspiration catheter was navigated over a phenom 21 microcatheter and an Aristotle 14 microguidewire into the cavernous segment of the right ICA. The microcatheter was then navigated over the wire into the M3  anterior division branch. Then, a 3 x 40 mm solitaire stent retriever was deployed spanning the M2 and M3 segment. The device was allowed to intercalated with the clot for 4 minutes. The microcatheter was removed. The aspiration catheter was advanced to the distal M1 segment and connected to an aspiration pump. The thrombectomy device and aspiration catheter were removed under constant aspiration. The guide catheter was aspirated for debris. Right internal carotid artery angiograms showed recanalization of the right M3/MCA anterior division branch with vasospasm and slow flow. Right anterior oblique and lateral angiograms of entire head showed additional occlusion of the distal callosum marginal artery and an A5/ACA branch. Flat panel CT of the head was obtained and post processed in a separate workstation with concurrent attending physician supervision. Selected images were sent to PACS. Contrast staining is noted in the paramedian right frontal cortex and right basal ganglia, suggestive of ongoing ischemia. Trace hyperdensity noted in the right sylvian fissure. Hyperdense foci noted in the right M1 and M2 segments. Frontal and lateral angiograms of the head showed filling defect in the M1/MCA and M2/MCA corresponding to the hyperdense foci noted on flat panel CT. The zoom 88 catheter was retracted into the right common carotid artery. Frontal and lateral angiograms of the neck were obtained revealing persistent high-grade stenosis at the right carotid bifurcation. A 4 x 30 mm Viatrac balloon was navigated over a synchro select 14 microwire into the right carotid bifurcation. Angioplasty was performed under fluoroscopy. Frontal and lateral angiograms of the neck showed no significant improvement of the degree of stenosis. Then, a 6 x 30 Viatrac balloon was navigated to the level of the carotid bifurcation. Angioplasty was performed under fluoroscopy. Cervical angiograms with frontal and lateral views showed  improvement of the stenosis with moderate residual stenosis. Follow-up frontal and lateral angiograms of the head showed worsening right M1/MCA stenosis and reocclusion of the superior and division branches. Using biplane roadmap guidance, a zoom 71 aspiration catheter was navigated over a Trak 21 microcatheter and an synchro select 14 microguidewire into the cavernous segment of the right ICA. The microcatheter was then navigated over the wire into the M2/MCA posterior division branch. Then, a 3 mm solitaire stent retriever was deployed spanning the M2 and M3 segment. The device was allowed to intercalated with the clot for 4 minutes. The microcatheter was removed. The aspiration catheter was advanced to the level of occlusion and connected to an aspiration pump. The thrombectomy device and aspiration catheter were removed under constant aspiration. The guide catheter was aspirated for debris. Right internal carotid artery angiogram showed recanalization of the posterior division branch. Worsening stenosis of the M1 segment seen. Persistent occlusion of the distal callosum marginal artery and anterior division. Flat panel CT of the head was obtained and post processed in a separate workstation with concurrent attending physician supervision. Selected images were sent to PACS. Stable contrast staining is noted in the paramedian right frontal  cortex and right basal ganglia and trace hyperdensity noted in the right sylvian fissure. Right common carotid artery angiograms with frontal and lateral views of the neck showed persistent moderate stenosis at the proximal right ICA. The zoom 88 catheter was again advanced into the distal cervical segment of the right internal carotid artery. Frontal and frog lateral right ICA angiograms were obtained. Persistent occlusion of the right MCA anterior division branch and distal callosum marginal artery is seen. Using biplane roadmap guidance, a zoom 71 aspiration catheter was  navigated over a phenom 21 microcatheter and an Aristotle 14 microguidewire into the cavernous segment of the right ICA. The microcatheter was then navigated over the wire into the right M3/MCA anterior division branch. Then, a 3 mm solitaire stent retriever was deployed spanning the M2 and M3 segments. The device was allowed to intercalated with the clot for 4 minutes. The microcatheter was removed. The aspiration catheter was advanced to the level of occlusion and connected to an aspiration pump. The thrombectomy device and aspiration catheter were removed under constant aspiration. The guide catheter was aspirated for debris. Right internal carotid artery angiograms with frontal and lateral views of the head showed recanalization of the anterior division. The nonocclusive filling defect in the posterior division M2 segment appears significantly decreased in size with minimal residual filling defect. Distal flow is noted now in the callosum marginal branch with persistent nonocclusive filling defect. Persistent M1 stenosis is seen. Delayed angiograms showed persistent patency in the anterior division with slow flow in an M3 branch. Persistent nonocclusive filling defect in the callosum marginal artery. At this point, patient was loaded on Cangrelor. Then, a 2 x 25 x 12 mm resolute onyx stent was navigated and deployed in the right M1/MCA segment, at the level of the stenosis. Magnified frontal and lateral angiograms of the head showed appropriate stent position in the M1 segment with complete resolution of the stenosis. 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 Then, a phenom Plus distal access catheter was navigated over a phenom 27 microcatheter and a synchro select 14 microguidewire into the right M1/MCA segment. Then, a 5 x 35 mm pipeline flex flow diverter was deployed spanning the petrous and cavernous segments. Frontal  and lateral angiograms of the head showed adequate position of the flow diverter with persistent carotid cavernous fistula. Then, a 5 x 25 pipeline flex flow diverter was deployed in a telescoping fashion within the previously the void flow diverter at the petrocavernous segment. Magnified frontal and lateral angiograms showed adequate position of the flow diverter construct. Frontal and lateral angiograms showed persistent carotid cavernous fistula. Frontal and lateral angiograms of the brain showed complete patency of the right MCA vascular tree. Persistent filling defect in the distal callosum marginal artery with brisk distal flow. Next, the zoom 88 guide catheter was retracted into the right common carotid artery. Frontal and lateral angiograms of the neck were obtained. Then, an 8-6 x 40 mm Xact carotid stent was deployed across the area of stenosis in the carotid bifurcation/proximal right ICA. Frontal and lateral angiograms of the neck showed adequate stent positioning with complete resolution of the stenosis. Contrast stagnation is seen within the plaque ulceration. Follow-up right internal carotid artery angiograms with frontal lateral views of the head showed no evidence of thromboembolic complication. Delay cervical angiograms showed no evidence of clot formation within the recently deployed carotid stent. Flat panel CT of the head was obtained and  post processed in a separate workstation with concurrent attending physician supervision. Selected images were sent to PACS. Stable findings as compared to prior intra procedural flat panel CT. The catheter was subsequently withdrawn. Right common femoral artery angiogram was obtained in right anterior oblique view. The puncture is at the level of the common femoral artery. The artery has normal caliber, adequate for closure device. The sheath was exchanged over the wire for a Perclose prostyle which was utilized for access closure. Immediate hemostasis was  achieved. IMPRESSION: 1. Successful mechanical thrombectomy and stenting for treatment of a right M1/MCA occlusion with underline atherosclerotic stenosis. 2. Successful angioplasty and stenting of the cervical right ICA for treatment of severe, near occlusive stenosis. 3. 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 Persistent flow through the fistula noted. 4. Postprocedural flat panel CT showing contrast staining is noted in the paramedian right frontal cortex and right basal ganglia, suggestive of ongoing ischemia. PLAN: 1. Patient extubated and sent to ICU in stable condition. 2. Carotid cavernous sinus fistula to be readdressed once clinically appropriate. Electronically Signed   By: Pedro Earls M.D.   On: 02/04/2022 15:36   IR CT Head Ltd  Result Date: 02/04/2022 INDICATION: Tsuruko C Haring is a 76 year old female with a past medical history significant for arthritis, CKD, COPD, Fuch's corneal endothelial dystrophy, HLD, HTN and hypothyroidism. She presented to the ED via EMS as a Code Stroke with acute onset of left sided weakness, left facial droop, dysarthria and neglect; NIHSS 12. Head CT showed early signs of right MCA territory infarct (ASPECTS 8) with calcific focus within the right MCA. Patient received IV TNK. CT angiogram of the head and neck showed a severe stenosis of the proximal right M1/MCA with occlusion of the distal M1 segment. Patient was taken to the radiology holding area for mechanical thrombectomy. However, fast improvement of symptoms was noted and procedure was held. Patient was then taken to MRI which showed severe, high-grade stenosis of the proximal right M1/MCA with patent right MCA vascular tree. A right posterior parietal infarct was also noted. At this point, mechanical thrombectomy was cancelled. However, at  15:30 on the same day (01/29/2022), the RN noticed recurrent symptoms with left-sided weakness and neglect, right gaze preference and left hemianopia, NIHSS 15. Stat head CT showed no evidence of TNK related hemorrhage. Patient was then taken back to the Angio suite for mechanical thrombectomy. EXAM: ULTRASOUND-GUIDED VASCULAR ACCESS DIAGNOSTIC CEREBRAL ANGIOGRAM MECHANICAL THROMBECTOMY INTRACRANIAL STENTING CERVICAL ANGIOPLASTY AND STENTING ENDOVASCULAR EMBOLIZATION OF CCSF FLAT PANEL HEAD CT (X2) COMPARISON:  CT/CT angiogram of the head and neck January 29, 2022 MEDICATIONS: Cangrelor IV drip and bolus. ANESTHESIA/SEDATION: The procedure was performed under general anesthesia. CONTRAST:  180 mL Omnipaque 300 milligram/mL. FLUOROSCOPY: Radiation Exposure Index (as provided by the fluoroscopic device): PSD 0,569 mGy Kerma COMPLICATIONS: SIR LEVEL C - Requires therapy, minor hospitalization (<48 hrs). TECHNIQUE: Informed written consent was obtained from the patient's husband after a thorough discussion of the procedural risks, benefits and alternatives. All questions were addressed. Maximal Sterile Barrier Technique was utilized including caps, mask, sterile gowns, sterile gloves, sterile drape, hand hygiene and skin antiseptic. A timeout was performed prior to the initiation of the procedure. The right groin was prepped and draped in the usual sterile fashion. Using a micropuncture kit and the modified Seldinger technique, access was gained to  the right common femoral artery and an 8 French sheath was placed. Real-time ultrasound guidance was utilized for vascular access including the acquisition of a permanent ultrasound image documenting patency of the accessed vessel. Under fluoroscopy, a Zoom 88 support guide catheter was navigated over a 6 [place] Berenstein 2 catheter and a 0.035" Terumo Glidewire into the aortic arch. The catheter was placed into the right common carotid artery. Frontal and lateral  angiograms of the head and neck obtained. FINDINGS: 1. Atherosclerotic changes are noted in the right common femoral artery with preserved caliber, adequate for vascular access. 2. Severe atherosclerotic changes of the right carotid bifurcation with severe, near occlusive stenosis of the proximal right internal carotid artery. An ulcerated plaque with contrast penetration is noted. 3. Atherosclerotic changes of the intracranial right ICA without hemodynamically significant stenosis. A plaque ulceration versus atherosclerotic aneurysm measuring approximately 2 mm is noted at the petrocavernous junction. 4. Occlusion of the proximal right M1/MCA. PROCEDURE: Using biplane roadmap guidance, the Berenstein 2 was advanced over the wire into the mid cervical segment of the right ICA. The zoom 88 guide catheter was then advanced over the Berenstein into the petrous segment of the right ICA. The [place] was removed and the zoom 88 was aspirated with fresh appearing clot retrieved. Right internal carotid artery angiograms with frontal and lateral views of the head showed recanalization of the M1 segment with near occlusive filling defect at the site of previous occlusion in the M1 segment. Diffuse intracranial atherosclerotic disease is seen. Occlusion of distal M3/MCA branches are noted in the anterior and posterior divisions. Nonocclusive filling defect in the dominant distal callosal marginal artery. Using biplane roadmap guidance, a zoom 71 aspiration catheter was navigated over Colossus 35 microguidewire into the cavernous segment of the right ICA. The aspiration catheter was then advanced to the level of occlusion and connected to an aspiration pump. Continuous aspiration was performed for 2 minutes. The guide catheter was connected to a VacLok syringe. The aspiration catheter was subsequently removed under constant aspiration. The guide catheter was aspirated for debris. Right internal carotid artery angiograms  showed improvement of the length of the filling defect within the M1 segment with underlying atherosclerotic plaque and severe stenosis. Occlusion of a superior division M3 segment is seen. The posterior division M3 segment appear recanalized with persistent occlusion in an M4 segment in the posterior parietal region. Using biplane roadmap guidance, a zoom 71 aspiration catheter was navigated over a phenom 21 microcatheter and an Aristotle 14 microguidewire into the cavernous segment of the right ICA. The microcatheter was then navigated over the wire into the M3 anterior division branch. Then, a 3 x 40 mm solitaire stent retriever was deployed spanning the M2 and M3 segment. The device was allowed to intercalated with the clot for 4 minutes. The microcatheter was removed. The aspiration catheter was advanced to the distal M1 segment and connected to an aspiration pump. The thrombectomy device and aspiration catheter were removed under constant aspiration. The guide catheter was aspirated for debris. Right internal carotid artery angiograms showed recanalization of the right M3/MCA anterior division branch with vasospasm and slow flow. Right anterior oblique and lateral angiograms of entire head showed additional occlusion of the distal callosum marginal artery and an A5/ACA branch. Flat panel CT of the head was obtained and post processed in a separate workstation with concurrent attending physician supervision. Selected images were sent to PACS. Contrast staining is noted in the paramedian right frontal cortex and right basal ganglia,  suggestive of ongoing ischemia. Trace hyperdensity noted in the right sylvian fissure. Hyperdense foci noted in the right M1 and M2 segments. Frontal and lateral angiograms of the head showed filling defect in the M1/MCA and M2/MCA corresponding to the hyperdense foci noted on flat panel CT. The zoom 88 catheter was retracted into the right common carotid artery. Frontal and lateral  angiograms of the neck were obtained revealing persistent high-grade stenosis at the right carotid bifurcation. A 4 x 30 mm Viatrac balloon was navigated over a synchro select 14 microwire into the right carotid bifurcation. Angioplasty was performed under fluoroscopy. Frontal and lateral angiograms of the neck showed no significant improvement of the degree of stenosis. Then, a 6 x 30 Viatrac balloon was navigated to the level of the carotid bifurcation. Angioplasty was performed under fluoroscopy. Cervical angiograms with frontal and lateral views showed improvement of the stenosis with moderate residual stenosis. Follow-up frontal and lateral angiograms of the head showed worsening right M1/MCA stenosis and reocclusion of the superior and division branches. Using biplane roadmap guidance, a zoom 71 aspiration catheter was navigated over a Trak 21 microcatheter and an synchro select 14 microguidewire into the cavernous segment of the right ICA. The microcatheter was then navigated over the wire into the M2/MCA posterior division branch. Then, a 3 mm solitaire stent retriever was deployed spanning the M2 and M3 segment. The device was allowed to intercalated with the clot for 4 minutes. The microcatheter was removed. The aspiration catheter was advanced to the level of occlusion and connected to an aspiration pump. The thrombectomy device and aspiration catheter were removed under constant aspiration. The guide catheter was aspirated for debris. Right internal carotid artery angiogram showed recanalization of the posterior division branch. Worsening stenosis of the M1 segment seen. Persistent occlusion of the distal callosum marginal artery and anterior division. Flat panel CT of the head was obtained and post processed in a separate workstation with concurrent attending physician supervision. Selected images were sent to PACS. Stable contrast staining is noted in the paramedian right frontal cortex and right basal  ganglia and trace hyperdensity noted in the right sylvian fissure. Right common carotid artery angiograms with frontal and lateral views of the neck showed persistent moderate stenosis at the proximal right ICA. The zoom 88 catheter was again advanced into the distal cervical segment of the right internal carotid artery. Frontal and frog lateral right ICA angiograms were obtained. Persistent occlusion of the right MCA anterior division branch and distal callosum marginal artery is seen. Using biplane roadmap guidance, a zoom 71 aspiration catheter was navigated over a phenom 21 microcatheter and an Aristotle 14 microguidewire into the cavernous segment of the right ICA. The microcatheter was then navigated over the wire into the right M3/MCA anterior division branch. Then, a 3 mm solitaire stent retriever was deployed spanning the M2 and M3 segments. The device was allowed to intercalated with the clot for 4 minutes. The microcatheter was removed. The aspiration catheter was advanced to the level of occlusion and connected to an aspiration pump. The thrombectomy device and aspiration catheter were removed under constant aspiration. The guide catheter was aspirated for debris. Right internal carotid artery angiograms with frontal and lateral views of the head showed recanalization of the anterior division. The nonocclusive filling defect in the posterior division M2 segment appears significantly decreased in size with minimal residual filling defect. Distal flow is noted now in the callosum marginal branch with persistent nonocclusive filling defect. Persistent M1 stenosis is seen.  Delayed angiograms showed persistent patency in the anterior division with slow flow in an M3 branch. Persistent nonocclusive filling defect in the callosum marginal artery. At this point, patient was loaded on Cangrelor. Then, a 2 x 25 x 12 mm resolute onyx stent was navigated and deployed in the right M1/MCA segment, at the level of the  stenosis. Magnified frontal and lateral angiograms of the head showed appropriate stent position in the M1 segment with complete resolution of the stenosis. 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 Then, a phenom Plus distal access catheter was navigated over a phenom 27 microcatheter and a synchro select 14 microguidewire into the right M1/MCA segment. Then, a 5 x 35 mm pipeline flex flow diverter was deployed spanning the petrous and cavernous segments. Frontal and lateral angiograms of the head showed adequate position of the flow diverter with persistent carotid cavernous fistula. Then, a 5 x 25 pipeline flex flow diverter was deployed in a telescoping fashion within the previously the void flow diverter at the petrocavernous segment. Magnified frontal and lateral angiograms showed adequate position of the flow diverter construct. Frontal and lateral angiograms showed persistent carotid cavernous fistula. Frontal and lateral angiograms of the brain showed complete patency of the right MCA vascular tree. Persistent filling defect in the distal callosum marginal artery with brisk distal flow. Next, the zoom 88 guide catheter was retracted into the right common carotid artery. Frontal and lateral angiograms of the neck were obtained. Then, an 8-6 x 40 mm Xact carotid stent was deployed across the area of stenosis in the carotid bifurcation/proximal right ICA. Frontal and lateral angiograms of the neck showed adequate stent positioning with complete resolution of the stenosis. Contrast stagnation is seen within the plaque ulceration. Follow-up right internal carotid artery angiograms with frontal lateral views of the head showed no evidence of thromboembolic complication. Delay cervical angiograms showed no evidence of clot formation within the recently deployed carotid stent. Flat panel CT of the head was obtained and post  processed in a separate workstation with concurrent attending physician supervision. Selected images were sent to PACS. Stable findings as compared to prior intra procedural flat panel CT. The catheter was subsequently withdrawn. Right common femoral artery angiogram was obtained in right anterior oblique view. The puncture is at the level of the common femoral artery. The artery has normal caliber, adequate for closure device. The sheath was exchanged over the wire for a Perclose prostyle which was utilized for access closure. Immediate hemostasis was achieved. IMPRESSION: 1. Successful mechanical thrombectomy and stenting for treatment of a right M1/MCA occlusion with underline atherosclerotic stenosis. 2. Successful angioplasty and stenting of the cervical right ICA for treatment of severe, near occlusive stenosis. 3. 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 Persistent flow through the fistula noted. 4. Postprocedural flat panel CT showing contrast staining is noted in the paramedian right frontal cortex and right basal ganglia, suggestive of ongoing ischemia. PLAN: 1. Patient extubated and sent to ICU in stable condition. 2. Carotid cavernous sinus fistula to be readdressed once clinically appropriate. Electronically Signed   By: Pedro Earls M.D.   On: 02/04/2022 15:36   IR INTRAVSC STENT CERV CAROTID W/O EMB-PROT MOD SED  Result Date: 02/04/2022 INDICATION: Erykah C Goens is a 76 year old female with a past medical  history significant for arthritis, CKD, COPD, Fuch's corneal endothelial dystrophy, HLD, HTN and hypothyroidism. She presented to the ED via EMS as a Code Stroke with acute onset of left sided weakness, left facial droop, dysarthria and neglect; NIHSS 12. Head CT showed early signs of right MCA territory infarct (ASPECTS 8) with  calcific focus within the right MCA. Patient received IV TNK. CT angiogram of the head and neck showed a severe stenosis of the proximal right M1/MCA with occlusion of the distal M1 segment. Patient was taken to the radiology holding area for mechanical thrombectomy. However, fast improvement of symptoms was noted and procedure was held. Patient was then taken to MRI which showed severe, high-grade stenosis of the proximal right M1/MCA with patent right MCA vascular tree. A right posterior parietal infarct was also noted. At this point, mechanical thrombectomy was cancelled. However, at 15:30 on the same day (01/29/2022), the RN noticed recurrent symptoms with left-sided weakness and neglect, right gaze preference and left hemianopia, NIHSS 15. Stat head CT showed no evidence of TNK related hemorrhage. Patient was then taken back to the Angio suite for mechanical thrombectomy. EXAM: ULTRASOUND-GUIDED VASCULAR ACCESS DIAGNOSTIC CEREBRAL ANGIOGRAM MECHANICAL THROMBECTOMY INTRACRANIAL STENTING CERVICAL ANGIOPLASTY AND STENTING ENDOVASCULAR EMBOLIZATION OF CCSF FLAT PANEL HEAD CT (X2) COMPARISON:  CT/CT angiogram of the head and neck January 29, 2022 MEDICATIONS: Cangrelor IV drip and bolus. ANESTHESIA/SEDATION: The procedure was performed under general anesthesia. CONTRAST:  180 mL Omnipaque 300 milligram/mL. FLUOROSCOPY: Radiation Exposure Index (as provided by the fluoroscopic device): PSD 9,794 mGy Kerma COMPLICATIONS: SIR LEVEL C - Requires therapy, minor hospitalization (<48 hrs). TECHNIQUE: Informed written consent was obtained from the patient's husband after a thorough discussion of the procedural risks, benefits and alternatives. All questions were addressed. Maximal Sterile Barrier Technique was utilized including caps, mask, sterile gowns, sterile gloves, sterile drape, hand hygiene and skin antiseptic. A timeout was performed prior to the initiation of the procedure. The right groin was prepped and draped  in the usual sterile fashion. Using a micropuncture kit and the modified Seldinger technique, access was gained to the right common femoral artery and an 8 French sheath was placed. Real-time ultrasound guidance was utilized for vascular access including the acquisition of a permanent ultrasound image documenting patency of the accessed vessel. Under fluoroscopy, a Zoom 88 support guide catheter was navigated over a 6 [place] Berenstein 2 catheter and a 0.035" Terumo Glidewire into the aortic arch. The catheter was placed into the right common carotid artery. Frontal and lateral angiograms of the head and neck obtained. FINDINGS: 1. Atherosclerotic changes are noted in the right common femoral artery with preserved caliber, adequate for vascular access. 2. Severe atherosclerotic changes of the right carotid bifurcation with severe, near occlusive stenosis of the proximal right internal carotid artery. An ulcerated plaque with contrast penetration is noted. 3. Atherosclerotic changes of the intracranial right ICA without hemodynamically significant stenosis. A plaque ulceration versus atherosclerotic aneurysm measuring approximately 2 mm is noted at the petrocavernous junction. 4. Occlusion of the proximal right M1/MCA. PROCEDURE: Using biplane roadmap guidance, the Berenstein 2 was advanced over the wire into the mid cervical segment of the right ICA. The zoom 88 guide catheter was then advanced over the Berenstein into the petrous segment of the right ICA. The [place] was removed and the zoom 88 was aspirated with fresh appearing clot retrieved. Right internal carotid artery angiograms with frontal and lateral views of the head showed recanalization of the M1 segment with near occlusive filling  defect at the site of previous occlusion in the M1 segment. Diffuse intracranial atherosclerotic disease is seen. Occlusion of distal M3/MCA branches are noted in the anterior and posterior divisions. Nonocclusive filling  defect in the dominant distal callosal marginal artery. Using biplane roadmap guidance, a zoom 71 aspiration catheter was navigated over Colossus 35 microguidewire into the cavernous segment of the right ICA. The aspiration catheter was then advanced to the level of occlusion and connected to an aspiration pump. Continuous aspiration was performed for 2 minutes. The guide catheter was connected to a VacLok syringe. The aspiration catheter was subsequently removed under constant aspiration. The guide catheter was aspirated for debris. Right internal carotid artery angiograms showed improvement of the length of the filling defect within the M1 segment with underlying atherosclerotic plaque and severe stenosis. Occlusion of a superior division M3 segment is seen. The posterior division M3 segment appear recanalized with persistent occlusion in an M4 segment in the posterior parietal region. Using biplane roadmap guidance, a zoom 71 aspiration catheter was navigated over a phenom 21 microcatheter and an Aristotle 14 microguidewire into the cavernous segment of the right ICA. The microcatheter was then navigated over the wire into the M3 anterior division branch. Then, a 3 x 40 mm solitaire stent retriever was deployed spanning the M2 and M3 segment. The device was allowed to intercalated with the clot for 4 minutes. The microcatheter was removed. The aspiration catheter was advanced to the distal M1 segment and connected to an aspiration pump. The thrombectomy device and aspiration catheter were removed under constant aspiration. The guide catheter was aspirated for debris. Right internal carotid artery angiograms showed recanalization of the right M3/MCA anterior division branch with vasospasm and slow flow. Right anterior oblique and lateral angiograms of entire head showed additional occlusion of the distal callosum marginal artery and an A5/ACA branch. Flat panel CT of the head was obtained and post processed in a  separate workstation with concurrent attending physician supervision. Selected images were sent to PACS. Contrast staining is noted in the paramedian right frontal cortex and right basal ganglia, suggestive of ongoing ischemia. Trace hyperdensity noted in the right sylvian fissure. Hyperdense foci noted in the right M1 and M2 segments. Frontal and lateral angiograms of the head showed filling defect in the M1/MCA and M2/MCA corresponding to the hyperdense foci noted on flat panel CT. The zoom 88 catheter was retracted into the right common carotid artery. Frontal and lateral angiograms of the neck were obtained revealing persistent high-grade stenosis at the right carotid bifurcation. A 4 x 30 mm Viatrac balloon was navigated over a synchro select 14 microwire into the right carotid bifurcation. Angioplasty was performed under fluoroscopy. Frontal and lateral angiograms of the neck showed no significant improvement of the degree of stenosis. Then, a 6 x 30 Viatrac balloon was navigated to the level of the carotid bifurcation. Angioplasty was performed under fluoroscopy. Cervical angiograms with frontal and lateral views showed improvement of the stenosis with moderate residual stenosis. Follow-up frontal and lateral angiograms of the head showed worsening right M1/MCA stenosis and reocclusion of the superior and division branches. Using biplane roadmap guidance, a zoom 71 aspiration catheter was navigated over a Trak 21 microcatheter and an synchro select 14 microguidewire into the cavernous segment of the right ICA. The microcatheter was then navigated over the wire into the M2/MCA posterior division branch. Then, a 3 mm solitaire stent retriever was deployed spanning the M2 and M3 segment. The device was allowed to intercalated with  the clot for 4 minutes. The microcatheter was removed. The aspiration catheter was advanced to the level of occlusion and connected to an aspiration pump. The thrombectomy device and  aspiration catheter were removed under constant aspiration. The guide catheter was aspirated for debris. Right internal carotid artery angiogram showed recanalization of the posterior division branch. Worsening stenosis of the M1 segment seen. Persistent occlusion of the distal callosum marginal artery and anterior division. Flat panel CT of the head was obtained and post processed in a separate workstation with concurrent attending physician supervision. Selected images were sent to PACS. Stable contrast staining is noted in the paramedian right frontal cortex and right basal ganglia and trace hyperdensity noted in the right sylvian fissure. Right common carotid artery angiograms with frontal and lateral views of the neck showed persistent moderate stenosis at the proximal right ICA. The zoom 88 catheter was again advanced into the distal cervical segment of the right internal carotid artery. Frontal and frog lateral right ICA angiograms were obtained. Persistent occlusion of the right MCA anterior division branch and distal callosum marginal artery is seen. Using biplane roadmap guidance, a zoom 71 aspiration catheter was navigated over a phenom 21 microcatheter and an Aristotle 14 microguidewire into the cavernous segment of the right ICA. The microcatheter was then navigated over the wire into the right M3/MCA anterior division branch. Then, a 3 mm solitaire stent retriever was deployed spanning the M2 and M3 segments. The device was allowed to intercalated with the clot for 4 minutes. The microcatheter was removed. The aspiration catheter was advanced to the level of occlusion and connected to an aspiration pump. The thrombectomy device and aspiration catheter were removed under constant aspiration. The guide catheter was aspirated for debris. Right internal carotid artery angiograms with frontal and lateral views of the head showed recanalization of the anterior division. The nonocclusive filling defect in the  posterior division M2 segment appears significantly decreased in size with minimal residual filling defect. Distal flow is noted now in the callosum marginal branch with persistent nonocclusive filling defect. Persistent M1 stenosis is seen. Delayed angiograms showed persistent patency in the anterior division with slow flow in an M3 branch. Persistent nonocclusive filling defect in the callosum marginal artery. At this point, patient was loaded on Cangrelor. Then, a 2 x 25 x 12 mm resolute onyx stent was navigated and deployed in the right M1/MCA segment, at the level of the stenosis. Magnified frontal and lateral angiograms of the head showed appropriate stent position in the M1 segment with complete resolution of the stenosis. 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 Then, a phenom Plus distal access catheter was navigated over a phenom 27 microcatheter and a synchro select 14 microguidewire into the right M1/MCA segment. Then, a 5 x 35 mm pipeline flex flow diverter was deployed spanning the petrous and cavernous segments. Frontal and lateral angiograms of the head showed adequate position of the flow diverter with persistent carotid cavernous fistula. Then, a 5 x 25 pipeline flex flow diverter was deployed in a telescoping fashion within the previously the void flow diverter at the petrocavernous segment. Magnified frontal and lateral angiograms showed adequate position of the flow diverter construct. Frontal and lateral angiograms showed persistent carotid cavernous fistula. Frontal and lateral angiograms of the brain showed complete patency of the right MCA vascular tree. Persistent filling defect in the distal callosum marginal artery with brisk distal flow. Next, the zoom 88  guide catheter was retracted into the right common carotid artery. Frontal and lateral angiograms of the neck were obtained. Then, an 8-6 x 40 mm  Xact carotid stent was deployed across the area of stenosis in the carotid bifurcation/proximal right ICA. Frontal and lateral angiograms of the neck showed adequate stent positioning with complete resolution of the stenosis. Contrast stagnation is seen within the plaque ulceration. Follow-up right internal carotid artery angiograms with frontal lateral views of the head showed no evidence of thromboembolic complication. Delay cervical angiograms showed no evidence of clot formation within the recently deployed carotid stent. Flat panel CT of the head was obtained and post processed in a separate workstation with concurrent attending physician supervision. Selected images were sent to PACS. Stable findings as compared to prior intra procedural flat panel CT. The catheter was subsequently withdrawn. Right common femoral artery angiogram was obtained in right anterior oblique view. The puncture is at the level of the common femoral artery. The artery has normal caliber, adequate for closure device. The sheath was exchanged over the wire for a Perclose prostyle which was utilized for access closure. Immediate hemostasis was achieved. IMPRESSION: 1. Successful mechanical thrombectomy and stenting for treatment of a right M1/MCA occlusion with underline atherosclerotic stenosis. 2. Successful angioplasty and stenting of the cervical right ICA for treatment of severe, near occlusive stenosis. 3. 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 Persistent flow through the fistula noted. 4. Postprocedural flat panel CT showing contrast staining is noted in the paramedian right frontal cortex and right basal ganglia, suggestive of ongoing ischemia. PLAN: 1. Patient extubated and sent to ICU in stable condition. 2. Carotid cavernous sinus fistula to be readdressed once clinically  appropriate. Electronically Signed   By: Pedro Earls M.D.   On: 02/04/2022 15:36   IR Intra Cran Stent  Result Date: 02/04/2022 INDICATION: Jeweldean C Nestor is a 76 year old female with a past medical history significant for arthritis, CKD, COPD, Fuch's corneal endothelial dystrophy, HLD, HTN and hypothyroidism. She presented to the ED via EMS as a Code Stroke with acute onset of left sided weakness, left facial droop, dysarthria and neglect; NIHSS 12. Head CT showed early signs of right MCA territory infarct (ASPECTS 8) with calcific focus within the right MCA. Patient received IV TNK. CT angiogram of the head and neck showed a severe stenosis of the proximal right M1/MCA with occlusion of the distal M1 segment. Patient was taken to the radiology holding area for mechanical thrombectomy. However, fast improvement of symptoms was noted and procedure was held. Patient was then taken to MRI which showed severe, high-grade stenosis of the proximal right M1/MCA with patent right MCA vascular tree. A right posterior parietal infarct was also noted. At this point, mechanical thrombectomy was cancelled. However, at 15:30 on the same day (01/29/2022), the RN noticed recurrent symptoms with left-sided weakness and neglect, right gaze preference and left hemianopia, NIHSS 15. Stat head CT showed no evidence of TNK related hemorrhage. Patient was then taken back to the Angio suite for mechanical thrombectomy. EXAM: ULTRASOUND-GUIDED VASCULAR ACCESS DIAGNOSTIC CEREBRAL ANGIOGRAM MECHANICAL THROMBECTOMY INTRACRANIAL STENTING CERVICAL ANGIOPLASTY AND STENTING ENDOVASCULAR EMBOLIZATION OF CCSF FLAT PANEL HEAD CT (X2) COMPARISON:  CT/CT angiogram of the head and neck January 29, 2022 MEDICATIONS: Cangrelor IV drip and bolus. ANESTHESIA/SEDATION: The procedure was performed under general anesthesia. CONTRAST:  180 mL  Omnipaque 300 milligram/mL. FLUOROSCOPY: Radiation Exposure Index (as provided by the fluoroscopic  device): PSD 0,867 mGy Kerma COMPLICATIONS: SIR LEVEL C - Requires therapy, minor hospitalization (<48 hrs). TECHNIQUE: Informed written consent was obtained from the patient's husband after a thorough discussion of the procedural risks, benefits and alternatives. All questions were addressed. Maximal Sterile Barrier Technique was utilized including caps, mask, sterile gowns, sterile gloves, sterile drape, hand hygiene and skin antiseptic. A timeout was performed prior to the initiation of the procedure. The right groin was prepped and draped in the usual sterile fashion. Using a micropuncture kit and the modified Seldinger technique, access was gained to the right common femoral artery and an 8 French sheath was placed. Real-time ultrasound guidance was utilized for vascular access including the acquisition of a permanent ultrasound image documenting patency of the accessed vessel. Under fluoroscopy, a Zoom 88 support guide catheter was navigated over a 6 [place] Berenstein 2 catheter and a 0.035" Terumo Glidewire into the aortic arch. The catheter was placed into the right common carotid artery. Frontal and lateral angiograms of the head and neck obtained. FINDINGS: 1. Atherosclerotic changes are noted in the right common femoral artery with preserved caliber, adequate for vascular access. 2. Severe atherosclerotic changes of the right carotid bifurcation with severe, near occlusive stenosis of the proximal right internal carotid artery. An ulcerated plaque with contrast penetration is noted. 3. Atherosclerotic changes of the intracranial right ICA without hemodynamically significant stenosis. A plaque ulceration versus atherosclerotic aneurysm measuring approximately 2 mm is noted at the petrocavernous junction. 4. Occlusion of the proximal right M1/MCA. PROCEDURE: Using biplane roadmap guidance, the Berenstein 2 was advanced over the wire into the mid cervical segment of the right ICA. The zoom 88 guide catheter  was then advanced over the Berenstein into the petrous segment of the right ICA. The [place] was removed and the zoom 88 was aspirated with fresh appearing clot retrieved. Right internal carotid artery angiograms with frontal and lateral views of the head showed recanalization of the M1 segment with near occlusive filling defect at the site of previous occlusion in the M1 segment. Diffuse intracranial atherosclerotic disease is seen. Occlusion of distal M3/MCA branches are noted in the anterior and posterior divisions. Nonocclusive filling defect in the dominant distal callosal marginal artery. Using biplane roadmap guidance, a zoom 71 aspiration catheter was navigated over Colossus 35 microguidewire into the cavernous segment of the right ICA. The aspiration catheter was then advanced to the level of occlusion and connected to an aspiration pump. Continuous aspiration was performed for 2 minutes. The guide catheter was connected to a VacLok syringe. The aspiration catheter was subsequently removed under constant aspiration. The guide catheter was aspirated for debris. Right internal carotid artery angiograms showed improvement of the length of the filling defect within the M1 segment with underlying atherosclerotic plaque and severe stenosis. Occlusion of a superior division M3 segment is seen. The posterior division M3 segment appear recanalized with persistent occlusion in an M4 segment in the posterior parietal region. Using biplane roadmap guidance, a zoom 71 aspiration catheter was navigated over a phenom 21 microcatheter and an Aristotle 14 microguidewire into the cavernous segment of the right ICA. The microcatheter was then navigated over the wire into the M3 anterior division branch. Then, a 3 x 40 mm solitaire stent retriever was deployed spanning the M2 and M3 segment. The device was allowed to intercalated with the clot for 4 minutes. The microcatheter was removed. The aspiration catheter was  advanced  to the distal M1 segment and connected to an aspiration pump. The thrombectomy device and aspiration catheter were removed under constant aspiration. The guide catheter was aspirated for debris. Right internal carotid artery angiograms showed recanalization of the right M3/MCA anterior division branch with vasospasm and slow flow. Right anterior oblique and lateral angiograms of entire head showed additional occlusion of the distal callosum marginal artery and an A5/ACA branch. Flat panel CT of the head was obtained and post processed in a separate workstation with concurrent attending physician supervision. Selected images were sent to PACS. Contrast staining is noted in the paramedian right frontal cortex and right basal ganglia, suggestive of ongoing ischemia. Trace hyperdensity noted in the right sylvian fissure. Hyperdense foci noted in the right M1 and M2 segments. Frontal and lateral angiograms of the head showed filling defect in the M1/MCA and M2/MCA corresponding to the hyperdense foci noted on flat panel CT. The zoom 88 catheter was retracted into the right common carotid artery. Frontal and lateral angiograms of the neck were obtained revealing persistent high-grade stenosis at the right carotid bifurcation. A 4 x 30 mm Viatrac balloon was navigated over a synchro select 14 microwire into the right carotid bifurcation. Angioplasty was performed under fluoroscopy. Frontal and lateral angiograms of the neck showed no significant improvement of the degree of stenosis. Then, a 6 x 30 Viatrac balloon was navigated to the level of the carotid bifurcation. Angioplasty was performed under fluoroscopy. Cervical angiograms with frontal and lateral views showed improvement of the stenosis with moderate residual stenosis. Follow-up frontal and lateral angiograms of the head showed worsening right M1/MCA stenosis and reocclusion of the superior and division branches. Using biplane roadmap guidance, a zoom 71  aspiration catheter was navigated over a Trak 21 microcatheter and an synchro select 14 microguidewire into the cavernous segment of the right ICA. The microcatheter was then navigated over the wire into the M2/MCA posterior division branch. Then, a 3 mm solitaire stent retriever was deployed spanning the M2 and M3 segment. The device was allowed to intercalated with the clot for 4 minutes. The microcatheter was removed. The aspiration catheter was advanced to the level of occlusion and connected to an aspiration pump. The thrombectomy device and aspiration catheter were removed under constant aspiration. The guide catheter was aspirated for debris. Right internal carotid artery angiogram showed recanalization of the posterior division branch. Worsening stenosis of the M1 segment seen. Persistent occlusion of the distal callosum marginal artery and anterior division. Flat panel CT of the head was obtained and post processed in a separate workstation with concurrent attending physician supervision. Selected images were sent to PACS. Stable contrast staining is noted in the paramedian right frontal cortex and right basal ganglia and trace hyperdensity noted in the right sylvian fissure. Right common carotid artery angiograms with frontal and lateral views of the neck showed persistent moderate stenosis at the proximal right ICA. The zoom 88 catheter was again advanced into the distal cervical segment of the right internal carotid artery. Frontal and frog lateral right ICA angiograms were obtained. Persistent occlusion of the right MCA anterior division branch and distal callosum marginal artery is seen. Using biplane roadmap guidance, a zoom 71 aspiration catheter was navigated over a phenom 21 microcatheter and an Aristotle 14 microguidewire into the cavernous segment of the right ICA. The microcatheter was then navigated over the wire into the right M3/MCA anterior division branch. Then, a 3 mm solitaire stent  retriever was deployed spanning the M2 and M3  segments. The device was allowed to intercalated with the clot for 4 minutes. The microcatheter was removed. The aspiration catheter was advanced to the level of occlusion and connected to an aspiration pump. The thrombectomy device and aspiration catheter were removed under constant aspiration. The guide catheter was aspirated for debris. Right internal carotid artery angiograms with frontal and lateral views of the head showed recanalization of the anterior division. The nonocclusive filling defect in the posterior division M2 segment appears significantly decreased in size with minimal residual filling defect. Distal flow is noted now in the callosum marginal branch with persistent nonocclusive filling defect. Persistent M1 stenosis is seen. Delayed angiograms showed persistent patency in the anterior division with slow flow in an M3 branch. Persistent nonocclusive filling defect in the callosum marginal artery. At this point, patient was loaded on Cangrelor. Then, a 2 x 25 x 12 mm resolute onyx stent was navigated and deployed in the right M1/MCA segment, at the level of the stenosis. Magnified frontal and lateral angiograms of the head showed appropriate stent position in the M1 segment with complete resolution of the stenosis. 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 Then, a phenom Plus distal access catheter was navigated over a phenom 27 microcatheter and a synchro select 14 microguidewire into the right M1/MCA segment. Then, a 5 x 35 mm pipeline flex flow diverter was deployed spanning the petrous and cavernous segments. Frontal and lateral angiograms of the head showed adequate position of the flow diverter with persistent carotid cavernous fistula. Then, a 5 x 25 pipeline flex flow diverter was deployed in a telescoping fashion within the previously the void flow diverter  at the petrocavernous segment. Magnified frontal and lateral angiograms showed adequate position of the flow diverter construct. Frontal and lateral angiograms showed persistent carotid cavernous fistula. Frontal and lateral angiograms of the brain showed complete patency of the right MCA vascular tree. Persistent filling defect in the distal callosum marginal artery with brisk distal flow. Next, the zoom 88 guide catheter was retracted into the right common carotid artery. Frontal and lateral angiograms of the neck were obtained. Then, an 8-6 x 40 mm Xact carotid stent was deployed across the area of stenosis in the carotid bifurcation/proximal right ICA. Frontal and lateral angiograms of the neck showed adequate stent positioning with complete resolution of the stenosis. Contrast stagnation is seen within the plaque ulceration. Follow-up right internal carotid artery angiograms with frontal lateral views of the head showed no evidence of thromboembolic complication. Delay cervical angiograms showed no evidence of clot formation within the recently deployed carotid stent. Flat panel CT of the head was obtained and post processed in a separate workstation with concurrent attending physician supervision. Selected images were sent to PACS. Stable findings as compared to prior intra procedural flat panel CT. The catheter was subsequently withdrawn. Right common femoral artery angiogram was obtained in right anterior oblique view. The puncture is at the level of the common femoral artery. The artery has normal caliber, adequate for closure device. The sheath was exchanged over the wire for a Perclose prostyle which was utilized for access closure. Immediate hemostasis was achieved. IMPRESSION: 1. Successful mechanical thrombectomy and stenting for treatment of a right M1/MCA occlusion with underline atherosclerotic stenosis. 2. Successful angioplasty and stenting of the cervical right ICA for treatment of severe, near  occlusive stenosis. 3. 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 Persistent flow through the fistula noted. 4. Postprocedural flat panel CT showing contrast staining is noted in the paramedian right frontal cortex and right basal ganglia, suggestive of ongoing ischemia. PLAN: 1. Patient extubated and sent to ICU in stable condition. 2. Carotid cavernous sinus fistula to be readdressed once clinically appropriate. Electronically Signed   By: Pedro Earls M.D.   On: 02/04/2022 15:36   DG Abd Portable 1V  Result Date: 02/02/2022 CLINICAL DATA:  Feeding tube placement EXAM: PORTABLE ABDOMEN - 1 VIEW COMPARISON:  CT 10/29/2020 FINDINGS: Feeding tube tip overlies the distal stomach. Nonobstructive bowel gas pattern. Vascular calcifications. There is a partially visualized right upper extremity PICC tip overlying the right atrium. Lower lung airspace opacities and probable trace effusions. L4-L5 lumbar fusion. IMPRESSION: Feeding tube tip overlies the distal stomach. Nonobstructive bowel gas pattern. Lower lung airspace opacities and probable small effusions. Recommend chest radiograph. Electronically Signed   By: Maurine Simmering M.D.   On: 02/02/2022 14:49   CT HEAD WO CONTRAST (5MM)  Result Date: 02/02/2022 CLINICAL DATA:  76 year old female code stroke presentation on 01/29/2022 with left side weakness, right MCA infarct, right M1 occlusion. Status post endovascular thrombectomy and intracranial stenting. Severe intracranial atherosclerotic disease. Subsequent encounter. EXAM: CT HEAD WITHOUT CONTRAST TECHNIQUE: Contiguous axial images were obtained from the base of the skull through the vertex without intravenous contrast. RADIATION DOSE REDUCTION: This exam was performed according to the departmental dose-optimization program which includes  automated exposure control, adjustment of the mA and/or kV according to patient size and/or use of iterative reconstruction technique. COMPARISON:  Post NIR head CT 01/30/2022 and earlier. FINDINGS: Brain: Confluent hypodensity compatible with cytotoxic edema now in the right inferior frontal gyrus and right basal ganglia. Patchy cytotoxic edema in the right posterior operculum and parietal lobe corresponding to DWI abnormality on 01/29/2022. There is some additional cytotoxic edema in the superior right perirolandic region when compared to prior DWI (series 3, image 24). Right hemisphere parenchymal and subarachnoid contrast staining versus blood has largely resolved. Trace residual in the right sylvian fissure. Also, there is a trace amount of layering intraventricular hemorrhage in the right occipital horn now. No midline shift or significant intracranial mass effect. No ventriculomegaly. Basilar cisterns appear patent and normal. No left hemisphere or posterior fossa cytotoxic edema or loss of gray-white matter differentiation identified.6 Vascular: Calcified atherosclerosis at the skull base. Proximal right ICA siphon stent including the petrous segment. Right MCA M1 stent. No suspicious intracranial vascular hyperdensity. Skull: No acute osseous abnormality identified. Sinuses/Orbits: Stable paranasal sinus mucosal thickening and some layering fluid. Tympanic cavities and mastoids remain clear. Other: New right side nasoenteric tube in place. Mild rightward gaze deviation. No acute scalp soft tissue finding. IMPRESSION: 1. Multifocal right hemisphere cytotoxic edema, including new involvement of the right inferior frontal gyrus, right basal ganglia, and the superior perirolandic cortex when compared to 01/29/2022 DWI. 2. But largely resolved intracranial contrast staining versus hemorrhage since 01/30/2022. Trace IVH and SAH now. 3. No intracranial mass effect or midline shift. No ventriculomegaly. 4. Right  ICA siphon and MCA vascular stents. Electronically Signed   By: Genevie Ann M.D.   On: 02/02/2022 04:13   DG Abd 1 View  Result Date: 02/01/2022 CLINICAL DATA:  NG tube placement EXAM: ABDOMEN - 1 VIEW COMPARISON:  Abdominal x-ray 01/30/2022 FINDINGS: Tip of the nasogastric tube is in the proximal stomach just  beyond the gastroesophageal junction similar to the prior study. Side hole is at the level of the distal esophagus, also unchanged. No dilated bowel loops are seen. Cholecystectomy clips are present. Lower lumbar fusion hardware is present. There are vascular calcifications in the left upper quadrant, unchanged. IMPRESSION: Nasogastric tube tip is in the proximal stomach just beyond the gastroesophageal junction, similar to prior. Recommend advancing tube proximally 12 cm. Electronically Signed   By: Darliss Cheney M.D.   On: 02/01/2022 21:54   [place] EKG SITE RITE  Result Date: 01/30/2022 If Site Rite image not attached, placement could not be confirmed due to current cardiac rhythm.  DG Abd Portable 1V  Result Date: 01/30/2022 CLINICAL DATA:  76 year old female status post nasogastric tube placement. EXAM: PORTABLE ABDOMEN - 1 VIEW COMPARISON:  No priors. FINDINGS: Tip of nasogastric tube is in the proximal stomach with side port just proximal to the gastroesophageal junction. Paucity of bowel gas in the visualized abdomen (abdomen is incompletely imaged). IMPRESSION: 1. Nasogastric tube is in a high position and could be advanced 10-15 cm for more optimal placement. Electronically Signed   By: Trudie Reed M.D.   On: 01/30/2022 05:35   CT HEAD WO CONTRAST ( )  Result Date: 01/30/2022 CLINICAL DATA:  Follow-up examination for stroke. EXAM: CT HEAD WITHOUT CONTRAST TECHNIQUE: Contiguous axial images were obtained from the base of the skull through the vertex without intravenous contrast. RADIATION DOSE REDUCTION: This exam was performed according to the departmental dose-optimization program  which includes automated exposure control, adjustment of the mA and/or kV according to patient size and/or use of iterative reconstruction technique. COMPARISON:  Prior intra procedural CT from 01/29/2022. FINDINGS: Brain: Sequelae of interval stenting at the right M1 segment. Hyperdensity seen within the right sylvian fissure. Parenchymal hypodensity seen involving the parasagittal and anterior right frontal lobe. Findings could reflect contrast staining and/or subarachnoid hemorrhage, and are similar to prior. Few scattered superimposed calcific densities, likely atherosclerotic calcifications. Evidence for underlying evolving right MCA territory infarct, most notable at the right basal ganglia. No significant regional mass effect at this time. No midline shift. No other acute intracranial hemorrhage. No other new large vessel territory infarct. No mass lesion or extra-axial fluid collection. No hydrocephalus. Vascular: Vascular stent in place at the right M1 segment. Additional stent in place at the petrous right ICA, extending into the cavernous segment. Some residual contrast material seen within the intracranial vasculature. Vascular calcifications present about the skull base. No new worrisome hyperdense vessel. Skull: Scalp soft tissues and calvarium demonstrate no new finding. Sinuses/Orbits: Globes and orbital soft tissues within normal limits. Scattered mucosal thickening noted about the ethmoidal air cells and maxillary sinuses. Paranasal sinuses are otherwise clear. Mastoid air cells remain clear. Other: None. IMPRESSION: 1. Sequelae of interval stenting of the right ICA and MCA. Scattered hyperdensity involving the right anterior and medial frontal region as well as the right sylvian fissure, similar to prior intra procedural CT. While this finding almost certainly reflects a degree of contrast staining, superimposed subarachnoid hemorrhage at the right sylvian fissure not excluded. Attention at  follow-up recommended. 2. Underlying evolving right MCA territory infarct. No significant regional mass effect at this time. 3. No other new acute intracranial abnormality. These results were communicated to Dr. Wilford Corner at 2:45 am on 01/30/2022 by text page via the [facility] messaging system. Electronically Signed   By: Rise Mu M.D.   On: 01/30/2022 02:46   ECHOCARDIOGRAM COMPLETE  Result Date: 01/29/2022  ECHOCARDIOGRAM REPORT   Patient Name:   WELLS GERDEMAN Date of Exam: 01/29/2022 Medical Rec #:  [id number]   Height:       64.0 in Accession #:    [id number]  Weight:       168.0 lb Date of Birth:  1946/02/20    BSA:          1.817 m Patient Age:    13 years    BP:           146/83 mmHg Patient Gender: F           HR:           50 bpm. Exam Location:  Inpatient Procedure: 2D Echo, Cardiac Doppler and Color Doppler Indications:    Stroke  History:        Patient has no prior history of Echocardiogram examinations.                 COPD; Risk Factors:Dyslipidemia and Hypertension.  Sonographer:    Jefferey Pica Referring Phys: [place]  1. Left ventricular ejection fraction, by estimation, is 60 to 65%. The left ventricle has normal function. The left ventricle has no regional wall motion abnormalities. Left ventricular diastolic parameters were normal.  2. Right ventricular systolic function is normal. The right ventricular size is normal.  3. The mitral valve is normal in structure. No evidence of mitral valve regurgitation. No evidence of mitral stenosis.  4. The aortic valve is normal in structure. Aortic valve regurgitation is mild. Aortic valve sclerosis/calcification is present, without any evidence of aortic stenosis.  5. The inferior vena cava is normal in size with greater than 50% respiratory variability, suggesting right atrial pressure of 3 mmHg. FINDINGS  Left Ventricle: Left ventricular ejection fraction, by estimation, is 60 to 65%. The left ventricle has normal function.  The left ventricle has no regional wall motion abnormalities. The left ventricular internal cavity size was normal in size. There is  no left ventricular hypertrophy. Left ventricular diastolic parameters were normal. Right Ventricle: The right ventricular size is normal. No increase in right ventricular wall thickness. Right ventricular systolic function is normal. Left Atrium: Left atrial size was normal in size. Right Atrium: Right atrial size was normal in size. Pericardium: There is no evidence of pericardial effusion. Mitral Valve: The mitral valve is normal in structure. There is mild thickening of the mitral valve leaflet(s). Mild mitral annular calcification. No evidence of mitral valve regurgitation. No evidence of mitral valve stenosis. Tricuspid Valve: The tricuspid valve is normal in structure. Tricuspid valve regurgitation is trivial. No evidence of tricuspid stenosis. Aortic Valve: The aortic valve is normal in structure. Aortic valve regurgitation is mild. Aortic regurgitation PHT measures 726 msec. Aortic valve sclerosis/calcification is present, without any evidence of aortic stenosis. Aortic valve peak gradient measures 7.7 mmHg. Pulmonic Valve: The pulmonic valve was normal in structure. Pulmonic valve regurgitation is trivial. No evidence of pulmonic stenosis. Aorta: The aortic root is normal in size and structure. Venous: The inferior vena cava is normal in size with greater than 50% respiratory variability, suggesting right atrial pressure of 3 mmHg. IAS/Shunts: No atrial level shunt detected by color flow Doppler.  LEFT VENTRICLE PLAX 2D LVIDd:         4.40 cm   Diastology LVIDs:         2.20 cm   LV e' medial:    6.38 cm/s LV PW:  1.00 cm   LV E/e' medial:  9.9 LV IVS:        1.15 cm   LV e' lateral:   8.63 cm/s LVOT diam:     2.00 cm   LV E/e' lateral: 7.3 LV SV:         81 LV SV Index:   45 LVOT Area:     3.14 cm  RIGHT VENTRICLE             IVC RV Basal diam:  2.60 cm     IVC  diam: 1.80 cm RV S prime:     11.70 cm/s TAPSE (M-mode): 2.3 cm LEFT ATRIUM             Index        RIGHT ATRIUM           Index LA diam:        3.90 cm 2.15 cm/m   RA Area:     10.70 cm LA Vol (A2C):   40.7 ml 22.40 ml/m  RA Volume:   20.40 ml  11.23 ml/m LA Vol (A4C):   30.2 ml 16.62 ml/m LA Biplane Vol: 35.2 ml 19.38 ml/m  AORTIC VALVE                 PULMONIC VALVE AV Area (Vmax): 2.71 cm     PV Vmax:       0.74 m/s AV Vmax:        139.00 cm/s  PV Peak grad:  2.2 mmHg AV Peak Grad:   7.7 mmHg LVOT Vmax:      120.00 cm/s LVOT Vmean:     65.100 cm/s LVOT VTI:       0.258 m AI PHT:         726 msec  AORTA Ao Root diam: 3.40 cm Ao Asc diam:  3.70 cm MITRAL VALVE MV Area (PHT): 3.77 cm     SHUNTS MV Decel Time: 201 msec     Systemic VTI:  0.26 m MV E velocity: 62.90 cm/s   Systemic Diam: 2.00 cm MV A velocity: 100.00 cm/s MV E/A ratio:  0.63 Aditya Sabharwal Electronically signed by Hebert Soho Signature Date/Time: 01/29/2022/4:34:48 PM    Final    CT HEAD WO CONTRAST (5MM)  Result Date: 01/29/2022 CLINICAL DATA:  Altered mental status EXAM: CT HEAD WITHOUT CONTRAST TECHNIQUE: Contiguous axial images were obtained from the base of the skull through the vertex without intravenous contrast. RADIATION DOSE REDUCTION: This exam was performed according to the departmental dose-optimization program which includes automated exposure control, adjustment of the mA and/or kV according to patient size and/or use of iterative reconstruction technique. COMPARISON:  CT head 01/29/2022.  Brain MRI 01/30/2019 FINDINGS: Brain: Redemonstrated are scattered sites of intracranial calcifications, which appear unchanged compared to prior exam, and may be related to a prior infectious or inflammatory process, or sites of chronic hemorrhage. No evidence of acute hemorrhage. No CT evidence of an infarct. Subtle hypodensity seen in the right parietal cortex, in the region of previously noted infarct on prior brain MRI.  Vascular: No hyperdense vessel or unexpected calcification. Skull: Normal. Negative for fracture or focal lesion. Sinuses/Orbits: Bilateral lens replacements. Other: None. IMPRESSION: No acute intracranial abnormality. Electronically Signed   By: Marin Roberts M.D.   On: 01/29/2022 16:10   MR ANGIO HEAD WO CONTRAST  Result Date: 01/29/2022 CLINICAL DATA:  Left-sided weakness.  Abnormal CTA. EXAM: MRA HEAD WITHOUT CONTRAST TECHNIQUE: Angiographic images of the  Circle of Willis were acquired using MRA technique without intravenous contrast. COMPARISON:  CTA of the head and neck 01/29/2022 FINDINGS: Anterior circulation: High cervical segments are within normal limits. Atherosclerotic changes are present within the cavernous ICA bilaterally. Moderate stenosis is present on the right. Mild narrowing is present the left. The ICA termini are within normal limits bilaterally. A high-grade right M1 segment stenosis is present with asymmetric attenuation of distal MCA branch vessels. Mild narrowing is present proximal right A1 segment. ACA branch vessels are within normal limits. Left A1 and M1 segments are within normal limits. MCA bifurcation is normal. Left MCA branch vessels are unremarkable. Posterior circulation: The left vertebral artery is the dominant vessel. The right PICA scratched at the right vertebral artery is centrally terminates at the PICA a with only a very hypoplastic V4 segment. Basilar artery is normal. Both posterior cerebral arteries originate from the basilar tip. Mild right P2 segment stenosis is present. Scattered irregularity is present without other significant stenosis. Anatomic variants: None Other: None. IMPRESSION: 1. Moderate stenosis of the right cavernous ICA. 2. Mild narrowing of the left cavernous ICA. 3. High-grade right M1 segment stenosis with asymmetric attenuation of distal MCA branch vessels. 4. Mild narrowing of the proximal right A1 segment. 5. Mild right P2 segment stenosis.  6. The left vertebral artery is the dominant vessel. Electronically Signed   By: San Morelle M.D.   On: 01/29/2022 13:46   MR BRAIN WO CONTRAST  Result Date: 01/29/2022 CLINICAL DATA:  Acute onset of left-sided weakness and right-sided gaze with slurred speech. Right M1 occlusion. EXAM: MRI HEAD WITHOUT CONTRAST TECHNIQUE: Multiplanar, multiecho pulse sequences of the brain and surrounding structures were obtained without intravenous contrast. COMPARISON:  CT head and CTA head and neck. FINDINGS: Brain: Mild restricted diffusion is present in the posterior right parietal cortex. No acute infarct is present in the insula or basal ganglia. Subtle FLAIR signal abnormalities associated with the area of restricted diffusion. Mild periventricular T2 hyperintensities are present bilaterally. A remote lacunar infarct is present in the lateral left thalamus. Remote lacunar infarct present in the right corona radiata. The ventricles are of normal size. No significant extraaxial fluid collection is present. The brainstem and cerebellum are within normal limits. The internal auditory canals are within normal limits. Vascular: Normal flow voids are present. Skull and upper cervical spine: The craniocervical junction is normal. Upper cervical spine is within normal limits. Marrow signal is unremarkable. Sinuses/Orbits: The paranasal sinuses and mastoid air cells are clear. Bilateral lens replacements are noted. Globes and orbits are otherwise unremarkable. IMPRESSION: 1. Acute/subacute nonhemorrhagic infarct involving the posterior right parietal cortex. 2. Remote lacunar infarct of the lateral left thalamus. 3. Remote lacunar infarct of the right corona radiata. 4. No acute infarct of the insula or right basal ganglia 5. Mild periventricular T2 hyperintensities bilaterally are mildly advanced for age. This likely reflects the sequela of chronic microvascular ischemia. The above was relayed via text pager to [place] on 01/29/2022 at 13:41 . Electronically Signed   By: San Morelle M.D.   On: 01/29/2022 13:41   CT ANGIO HEAD NECK W WO CM (CODE STROKE)  Result Date: 01/29/2022 CLINICAL DATA:  Follow-up stroke EXAM: CT ANGIOGRAPHY HEAD AND NECK TECHNIQUE: Multidetector CT imaging of the head and neck was performed using the standard protocol during bolus administration of intravenous contrast. Multiplanar CT image reconstructions and MIPs were obtained to evaluate the vascular anatomy. Carotid stenosis measurements (when applicable) are obtained utilizing  NASCET criteria, using the distal internal carotid diameter as the denominator. RADIATION DOSE REDUCTION: This exam was performed according to the departmental dose-optimization program which includes automated exposure control, adjustment of the mA and/or kV according to patient size and/or use of iterative reconstruction technique. CONTRAST:  54mL OMNIPAQUE IOHEXOL 350 MG/ML SOLN COMPARISON:  Same day stroke code CT FINDINGS: CT HEAD FINDINGS Brain: Please see same-day stroke code CT brain for findings related to right MCA territory infarct. Vascular: See below Skull: Normal. Negative for fracture or focal lesion. Sinuses/Orbits: Bilateral lens replacements. No significant sinus opacification. No mastoid effusion. Other: None Review of the MIP images confirms the above findings CTA NECK FINDINGS Aortic arch: Standard branching. Imaged portion shows no evidence of aneurysm or dissection. No significant stenosis of the major arch vessel origins. Right carotid system: There is severe stenosis at the origin of the right internal carotid artery (greater than 75%) Left carotid system: There is mild narrowing of the origin of the left internal carotid artery. Vertebral arteries: Left dominant system. The distal V4 segment of the right vertebral artery is noncontrast opacified and may be occluded. Skeleton: Negative. Other neck: Negative. Upper chest: Negative.  Review of the MIP images confirms the above findings CTA HEAD FINDINGS Anterior circulation: There is occlusion of the proximal right M1 segment with non opacification of the right M2 and M3 MCA segments. There is decreased arborization in the right MCA territory compared to the left (series 10, image 18). There is moderate stenosis of the distal cavernous segment of the left ICA. Hazy Posterior circulation: There is severe stenosis at the distal basilar artery (series 11, image 20).Mild narrowing in the P1 and P2 segments of the right PCA. 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 Anatomic variants: None Review of the MIP images confirms the above findings IMPRESSION: 1. Acute occlusion of the proximal right M1 with nonopacification of the right M2/M3 segments and decreased arborization in the right MCA territory, compared to left. 2.  Severe stenosis at the origin of the right ICA (>75%). 3.  High grade narrowing in the distal basilar artery. 4. The distal V4 segment of the right vertebral artery may terminate in a PICA or be occluded; recommend comparison with prior imaging,if available. Critical Value/emergent results were called by telephone at the time of interpretation on 01/29/2022 at 12:00 pm to provider ERIC [facility] , who verbally acknowledged these results. Electronically Signed   By: Marin Roberts M.D.   On: 01/29/2022 12:08   CT HEAD CODE STROKE WO CONTRAST  Result Date: 01/29/2022 CLINICAL DATA:  Code stroke.  Diffuse left-sided weakness. EXAM: CT HEAD WITHOUT CONTRAST TECHNIQUE: Contiguous axial images were obtained from the base of the skull through the vertex without intravenous contrast. RADIATION DOSE REDUCTION: This exam was performed according to the departmental dose-optimization program which includes automated exposure control, adjustment of the mA and/or kV according to patient size and/or use of iterative reconstruction technique. COMPARISON:  None  Available. FINDINGS: Brain: Loss of gray-white differentiation is present in the posterior right insular cortex and right lentiform nucleus. No acute hemorrhage is present. No other definite cortical loss is present. An age indeterminate white matter infarct is present in the right corona radiata likely chronic. Mild white matter changes are present throughout. The ventricles are of normal size. No significant extraaxial fluid collection is present. The brainstem and cerebellum are within normal limits. Vascular: Atherosclerotic calcifications are present within the cavernous internal carotid arteries bilaterally. Dense calcifications are  present within the distal left vertebral artery. Skull: Calvarium is intact. No focal lytic or blastic lesions are present. No significant extracranial soft tissue lesion is present. Sinuses/Orbits: The paranasal sinuses and mastoid air cells are clear. Bilateral lens replacements are noted. Globes and orbits are otherwise unremarkable. ASPECTS [facility] Stroke Program Early CT Score) - Ganglionic level infarction (caudate, lentiform nuclei, internal capsule, insula, M1-M3 cortex): 5/7 - Supraganglionic infarction (M4-M6 cortex): 3/3 Total score (0-10 with 10 being normal): 8/10 IMPRESSION: 1. Loss of gray-white differentiation in the posterior right insular cortex and right lentiform nucleus compatible with acute/subacute right MCA infarct. 2. Aspects 8/10 3. No acute hemorrhage. 4. Age indeterminate white matter infarct in the right corona radiata likely chronic. 5. Atherosclerosis. These results were called by telephone at the time of interpretation on 01/29/2022 at 11:23 am to Dr. Kerney Elbe, who verbally acknowledged these results. Electronically Signed   By: San Morelle M.D.   On: 01/29/2022 11:23       HISTORY OF PRESENT ILLNESS  Deniese C Mark is an 76 y.o. female with a PMHx of arthritis, CKD, COPD, Fuch's corneal endothelial dystrophy, HLD, HTN and hypothyroidism  who presents to the ED via EMS as a Code Stroke with acute onset of left sided weakness, left facial droop and dysarthria with neglect. Symptom onset was at 47 while she was working out. She was standing up performing an exercise when she felt her left leg give way. She collapsed to the floor and EMS was called. On their arrival, the above symptoms were noted. She was emergently transported to the [facility] ED. On arrival she continued to exhibit the above clinical findings. She has no prior history of stroke.  She was treated with IV thrombolysis with TNK followed by successful mechanical thrombectomy with recanalization of the right middle cerebral artery but requiring rescue proximal right ICA stenting.  Patient was kept in the ICU and gradually improved however she continued to have dense left hemiplegia.  She was able to tolerate a diet but had repeated urinary retention requiring indwelling Foley catheter.  He was transferred to inpatient rehab in a stable condition HOSPITAL COURSE Patient received TNK and initially her symptoms improved and was transferred back to the ED bay.Patient then had worsening neurological exam and Code Stroke was reactivated as CODE IR. Stroke: Right MCA infarct s/p TNK and IR with TICI2c, likely due to large vessel disease from R ICA high grade stenosis   Code Stroke CT head Loss of gray-white differentiation in the posterior right insular cortex and right lentiform nucleus compatible with acute/subacute right MCA infarct. Aspects 8/10 CTA head & neck Acute occlusion of the proximal right M1. Severe stenosis at the origin of the right ICA (>75%). MRI Acute/subacute small infarct involving the posterior right parietal cortex  MRA moderate stenosis right cavernous ICA, high-grade right M1 segment stenosis IR showed right M1 and ACA occlusion status post EVT with TICI3 and R MCA and proximal ICA stents.  Complicated by CC fistula s/p flow diverters and stable CT head 9/26  multifocal right hemisphere infarcts including right frontal gyrus, right BG, superior perirolandic cortex.  Trace IVH and SAH.  Right ICA siphon and MCA vascular stents. CT repeat 10/4 stable infarct, no evidence of hemorrhage or new infarct. 2D Echo EF 55-60% LDL 125 HgbA1c 5.4 VTE prophylaxis -heparin subcu No antithrombotic prior to admission, now on aspirin 81 mg daily and Brilinta (ticagrelor) 90 mg bid.  Therapy recommendations:  CIR  Disposition: Discharge  DISCHARGE EXAM Blood pressure (!) 148/56, pulse 68, temperature 99.4 F (37.4 C), temperature source Oral, resp. rate 20, height $RemoveBe'5\' 4"'WBHDCfLhd$  (1.626 m), weight 69.8 kg, SpO2 99 %. Patient is awake in bed was in process of eating, Regards the examiner in the room.Follows commands and able to recite month/date and year correct,.speech seems clear w/o aphasia and no dysarthria. Positive left facial droop, She was able to identify 3 objects.Positive right gaze,EOM I ,no nystagmus. No blinking to visual threat on left . TNL. Motor Strength: LUE/LLE hemiplegic , had some flinching of LLE with noxious stimulation . RUE 4-/5 RLE 4/5.  Sensation is intact in all extremities Gait not tested  Discharge Diet      Diet   DIET DYS 3 Room service appropriate? Yes with Assist; Fluid consistency: Nectar Thick   liquids  DISCHARGE PLAN Disposition:  Transfer to [facility] for ongoing PT, OT and ST aspirin 81 mg daily and Brilinta (ticagrelor) 90 mg bid for secondary stroke prevention for 6 months, and then aspirin alone Recommend ongoing stroke risk factor control by Primary Care Physician at time of discharge from inpatient rehabilitation. Follow-up PCP Idelle Crouch, MD in 2 weeks following discharge from rehab. Follow-up in [facility] with Dr Leonie Man in 8 weeks following discharge from rehab, office to schedule an appointment.   45 minutes were spent preparing discharge.   I have personally  obtained history,examined this patient, reviewed notes, independently viewed imaging studies, participated in medical decision making and plan of care.ROS completed by me personally and pertinent positives fully documented  I have made any additions or clarifications directly to the above note. Agree with note above.    Antony Contras, MD Medical Director [facility] St[facility] Pager: [phone] 02/16/2022 1:51 PM

## 2022-02-16 NOTE — Progress Notes (Signed)
Orthopedic Tech Progress Note Patient Details:  Julia Deleon 02-21-1946 [id number]  Called in order to [id number] to order a REHAB COMBO, which is a WHO, WRIST COCK UP and PRAFO.  Patient ID: Julia Deleon, female   DOB: [birth year], 76 y.o.   MRN: [id number]  Julia Deleon 02/16/2022, 5:39 PM

## 2022-02-16 NOTE — Progress Notes (Signed)
Inpatient Rehab Admissions Coordinator:    I have a CIR bed for this pt. Today, RN may call report to [phone].  Clemens Catholic, [place], [place] Admissions Coordinator  [phone] ([place]) [phone] (office)

## 2022-02-16 NOTE — Progress Notes (Signed)
Patient arrived to room with no distress noted. Patient has no belongings visible, she states her cellphone is with her husband who left for lunch and will return. Went over patient belongings policy. Sister is at bedside. Skin check done with charge nurse, noted bruising to abdomen and left arm primarily. Redness to inner buttock area, redness to heels that is blanchable.

## 2022-02-16 NOTE — TOC Transition Note (Signed)
Transition of Care [facility]) - CM/SW Discharge Note   Patient Details  Name: Julia Deleon MRN: [id number] Date of Birth: 02-Jun-1945  [facility] CM/SW Contact:  Pollie Friar, RN Phone Number: 02/16/2022, 10:31 AM   Clinical Narrative:    Pt is discharging to CIR today. CM signing off   Final next level of care: [place] Barriers to Discharge: No Barriers Identified   Patient Goals and CMS Choice Patient states their goals for this hospitalization and ongoing recovery are:: Rehab CMS Medicare.gov Compare Post Acute Care list provided to:: Patient Choice offered to / list presented to : Patient  Discharge Placement                       Discharge Plan and Services In-house Referral: Clinical Social Work   Post Acute Care Choice: [place], IP Rehab                               Social Determinants of Health (SDOH) Interventions     Readmission Risk Interventions     No data to display

## 2022-02-16 NOTE — Progress Notes (Signed)
Report called to Mickel Baas RN at [facility]

## 2022-02-17 DIAGNOSIS — I63511 Cerebral infarction due to unspecified occlusion or stenosis of right middle cerebral artery: Secondary | ICD-10-CM | POA: Diagnosis not present

## 2022-02-17 LAB — CBC WITH DIFFERENTIAL/PLATELET
Abs Immature Granulocytes: 0.47 10*3/uL — ABNORMAL HIGH (ref 0.00–0.07)
Basophils Absolute: 0.1 10*3/uL (ref 0.0–0.1)
Basophils Relative: 1 %
Eosinophils Absolute: 0.3 10*3/uL (ref 0.0–0.5)
Eosinophils Relative: 3 %
HCT: 33.3 % — ABNORMAL LOW (ref 36.0–46.0)
Hemoglobin: 10.8 g/dL — ABNORMAL LOW (ref 12.0–15.0)
Immature Granulocytes: 5 %
Lymphocytes Relative: 22 %
Lymphs Abs: 2.1 10*3/uL (ref 0.7–4.0)
MCH: 31.4 pg (ref 26.0–34.0)
MCHC: 32.4 g/dL (ref 30.0–36.0)
MCV: 96.8 fL (ref 80.0–100.0)
Monocytes Absolute: 0.7 10*3/uL (ref 0.1–1.0)
Monocytes Relative: 7 %
Neutro Abs: 6.1 10*3/uL (ref 1.7–7.7)
Neutrophils Relative %: 62 %
Platelets: 297 10*3/uL (ref 150–400)
RBC: 3.44 MIL/uL — ABNORMAL LOW (ref 3.87–5.11)
RDW: 15.3 % (ref 11.5–15.5)
WBC: 9.8 10*3/uL (ref 4.0–10.5)
nRBC: 0 % (ref 0.0–0.2)

## 2022-02-17 LAB — GLUCOSE, CAPILLARY
Glucose-Capillary: 111 mg/dL — ABNORMAL HIGH (ref 70–99)
Glucose-Capillary: 102 mg/dL — ABNORMAL HIGH (ref 70–99)
Glucose-Capillary: 98 mg/dL (ref 70–99)

## 2022-02-17 LAB — COMPREHENSIVE METABOLIC PANEL
ALT: 26 U/L (ref 0–44)
AST: 20 U/L (ref 15–41)
Albumin: 3.2 g/dL — ABNORMAL LOW (ref 3.5–5.0)
Alkaline Phosphatase: 64 U/L (ref 38–126)
Anion gap: 9 (ref 5–15)
BUN: 24 mg/dL — ABNORMAL HIGH (ref 8–23)
CO2: 19 mmol/L — ABNORMAL LOW (ref 22–32)
Calcium: 9 mg/dL (ref 8.9–10.3)
Chloride: 110 mmol/L (ref 98–111)
Creatinine, Ser: 1.09 mg/dL — ABNORMAL HIGH (ref 0.44–1.00)
GFR, Estimated: 53 mL/min — ABNORMAL LOW (ref >60)
Glucose, Bld: 109 mg/dL — ABNORMAL HIGH (ref 70–99)
Potassium: 3.7 mmol/L (ref 3.5–5.1)
Sodium: 138 mmol/L (ref 135–145)
Total Bilirubin: 0.7 mg/dL (ref 0.3–1.2)
Total Protein: 6.7 g/dL (ref 6.5–8.1)

## 2022-02-17 MED ORDER — DICLOFENAC SODIUM 1 % EX GEL
2.0000 g | Freq: Three times a day (TID) | CUTANEOUS | Status: DC
  Administered 2022-02-17 – 2022-03-04 (×47): 2 g via TOPICAL
  Filled 2022-02-17 (×2): qty 100

## 2022-02-17 MED ORDER — TROLAMINE SALICYLATE 10 % EX CREA: TOPICAL_CREAM | Freq: Three times a day (TID) | CUTANEOUS | Status: DC

## 2022-02-17 MED ORDER — ESTRADIOL 0.1 MG/GM VA CREA: 0.5000 g | TOPICAL_CREAM | VAGINAL | Status: AC

## 2022-02-17 MED ORDER — SODIUM CHLORIDE 0.9% FLUSH
10.0000 mL | Freq: Two times a day (BID) | INTRAVENOUS | Status: DC
  Administered 2022-02-17 – 2022-02-23 (×3): 10 mL
  Administered 2022-02-25: 30 mL

## 2022-02-17 MED ORDER — SODIUM CHLORIDE 0.9% FLUSH
10.0000 mL | Freq: Two times a day (BID) | INTRAVENOUS | Status: DC
  Administered 2022-02-19 – 2022-02-25 (×3): 10 mL

## 2022-02-17 MED ORDER — SODIUM CHLORIDE 0.9% FLUSH: 10.0000 mL | INTRAVENOUS | Status: DC | PRN

## 2022-02-17 MED ORDER — CHLORHEXIDINE GLUCONATE CLOTH 2 % EX PADS
6.0000 | MEDICATED_PAD | Freq: Two times a day (BID) | CUTANEOUS | Status: DC
  Administered 2022-02-17 – 2022-02-19 (×5): 6 via TOPICAL

## 2022-02-17 MED ORDER — PHENOL 1.4 % MT LIQD
1.0000 | OROMUCOSAL | Status: DC | PRN
  Filled 2022-02-17: qty 177

## 2022-02-17 MED ORDER — SODIUM CHLORIDE 0.9% FLUSH
10.0000 mL | INTRAVENOUS | Status: DC | PRN
  Administered 2022-02-17: 30 mL

## 2022-02-17 MED ORDER — ACETAMINOPHEN 325 MG PO TABS: 325.0000 mg | ORAL_TABLET | ORAL | Status: DC | PRN

## 2022-02-17 NOTE — Patient Care Conference (Signed)
Inpatient RehabilitationTeam Conference and Plan of Care Update Date: 02/17/2022   Time: 10:23 AM    Patient Name: Julia Deleon      Medical Record Number: [id number]  Date of Birth: 02/02/1946 Sex: Female         Room/Bed: 4W13C/4W13C-01 Payor Info: Payor: MEDICARE / Plan: MEDICARE PART A AND B / Product Type: *No Product type* /    Admit Date/Time:  02/16/2022  2:30 PM  Primary Diagnosis:  Acute ischemic right MCA stroke [facility])  Hospital Problems: Principal Problem:   Acute ischemic right MCA stroke [facility])    Expected Discharge Date: Expected Discharge Date:  (ELOS 3 wks)  Team Members Present: Physician leading conference: Dr. Alysia Penna Social Worker Present: Erlene Quan, BSW Nurse Present: Dorien Chihuahua, RN PT Present: Barrie Folk, PT OT Present: Meriel Pica, OT SLP Present: Sherren Kerns, SLP PPS Coordinator present : Gunnar Fusi, SLP     Current Status/Progress Goal Weekly Team Focus  Bowel/Bladder   Pt is incontinent of bowel and has a foley in place. last bm 10/10  Regain continence of b/b  Assist with q2 toileting, keep foley cath clean   Swallow/Nutrition/ Hydration             ADL's             Mobility   Eval Pending  Eval Pending  Eval pending   Communication             Safety/Cognition/ Behavioral Observations  eval pending         Pain   pt c/o headache 8 out of 10 on the pain scale, pt also complains of R shoulder pain. prn tylenol and fioricet  pain level <3  Assess pain qshift and prn   Skin   Skin intact, Pt has some generalized bruises on abdomen  Keep skin intact  Assess skin qshift and prn     Discharge Planning:    Home with spouse who works daily; late on T/H. 1 level 8 ste/1 ste via garage. Children out of town.  Team Discussion: Patient post large right MCA CVA with stenosis, post stenting with left inattention left side flaccidity, motoro planning issues and neck pain/torticollis.  Patient on target to meet  rehab goals: Currently with a foley; incontinent using peripads PTA, post bladder tack. Chronic back pain now with new neck pain.   *See Care Plan and progress notes for long and short-term goals.   Revisions to Treatment Plan:  N/a  Teaching Needs: Safety, medications, dietary modifications, transfers, toileting, etc.   Current Barriers to Discharge: Home enviroment access/layout, Neurogenic bowel and bladder, and Lack of/limited family support  Possible Resolutions to Barriers: Family education SNF recommended vs ramp for entry, hired caregivers to assist spouse, etc     Medical Summary Current Status: New admit, CLBP, severe left neglect, severe left hemiparesis  Barriers to Discharge: Medical stability   Possible Resolutions to [employer] Focus: will need BP to run on higher side due to intracranial stenosis   Continued Need for Acute Rehabilitation Level of Care: The patient requires daily medical management by a physician with specialized training in physical medicine and rehabilitation for the following reasons: Direction of a multidisciplinary physical rehabilitation program to maximize functional independence : Yes Medical management of patient stability for increased activity during participation in an intensive rehabilitation regime.: Yes Analysis of laboratory values and/or radiology reports with any subsequent need for medication adjustment and/or medical intervention. : Yes   I  attest that I was present, lead the team conference, and concur with the assessment and plan of the team.   Dorien Chihuahua B 02/17/2022, 2:43 PM

## 2022-02-17 NOTE — Progress Notes (Signed)
PROGRESS NOTE   Subjective/Complaints:  Pt up in chair no c/o, discussed intracranial stenosis with pt and husband, also discussed rehab process  ROS- neg CP, SOB, N/V/D  Objective:   No results found. Recent Labs    02/16/22 0400 02/17/22 0800  WBC 12.1* 9.8  HGB 10.3* 10.8*  HCT 30.3* 33.3*  PLT 294 297   Recent Labs    02/16/22 0400 02/17/22 0800  NA 135 138  K 3.7 3.7  CL 108 110  CO2 17* 19*  GLUCOSE 114* 109*  BUN 25* 24*  CREATININE 1.00 1.09*  CALCIUM 9.0 9.0    Intake/Output Summary (Last 24 hours) at 02/17/2022 0936 Last data filed at 02/17/2022 0801 Gross per 24 hour  Intake 30 ml  Output 2600 ml  Net -2570 ml        Physical Exam: Vital Signs Blood pressure (!) 153/60, pulse 68, temperature 97.7 F (36.5 C), resp. rate 16, weight 72.3 kg, SpO2 96 %.   General: No acute distress Mood and affect are appropriate Heart: Regular rate and rhythm no rubs murmurs or extra sounds Lungs: Clear to auscultation, breathing unlabored, no rales or wheezes Abdomen: Positive bowel sounds, soft nontender to palpation, nondistended Extremities: No clubbing, cyanosis, or edema Skin: No evidence of breakdown, no evidence of rash Neurologic: Cranial nerves II through XII intact, motor strength is 5/5 in right and 0/5 deltoid, bicep, tricep, grip, hip flexor, knee extensors, ankle dorsiflexor and plantar flexor Sensory exam normal sensation to light touch and proprioception in bilateral upper and lower extremities Cerebellar exam normal finger to nose to finger as well as heel to shin in bilateral upper and lower extremities Musculoskeletal: Full range of motion in all 4 extremities. No joint swelling   Assessment/Plan: 1. Functional deficits which require 3+ hours per day of interdisciplinary therapy in a comprehensive inpatient rehab setting. Physiatrist is providing close team supervision and 24 hour  management of active medical problems listed below. Physiatrist and rehab team continue to assess barriers to discharge/monitor patient progress toward functional and medical goals  Care Tool:  Bathing    Body parts bathed by patient: Left arm, Chest, Abdomen, Face   Body parts bathed by helper: Front perineal area, Right lower leg, Buttocks, Right upper leg, Left upper leg, Left lower leg     Bathing assist Assist Level: Total Assistance - Patient < 25%     Upper Body Dressing/Undressing Upper body dressing   What is the patient wearing?: Pull over shirt    Upper body assist Assist Level: Total Assistance - Patient < 25%    Lower Body Dressing/Undressing Lower body dressing      What is the patient wearing?: Pants, Underwear/pull up     Lower body assist Assist for lower body dressing: Total Assistance - Patient < 25%     Toileting Toileting    Toileting assist Assist for toileting: Dependent - Patient 0%     Transfers Chair/bed transfer  Transfers assist     Chair/bed transfer assist level: 2 Helpers     Locomotion Ambulation   Ambulation assist      Assist level: 2 helpers   Max distance: Tilt  in space wc   Walk 10 feet activity   Assist           Walk 50 feet activity   Assist           Walk 150 feet activity   Assist           Walk 10 feet on uneven surface  activity   Assist           Wheelchair     Assist Is the patient using a wheelchair?: Yes Type of Wheelchair:  (Tilt in space)    Wheelchair assist level: Dependent - Patient 0%      Wheelchair 50 feet with 2 turns activity    Assist        Assist Level: Dependent - Patient 0%   Wheelchair 150 feet activity     Assist      Assist Level: Dependent - Patient 0%   Blood pressure (!) 153/60, pulse 68, temperature 97.7 F (36.5 C), resp. rate 16, weight 72.3 kg, SpO2 96 %.  Medical Problem List and Plan: 1. Functional deficits  secondary to   Right MCA infarct s/p TNK and IR with TICI2c with left hemiplegia             -patient may  shower             -ELOS/Goals: 12-14 days             -Admit to CIR             -PRAFO ordered             -She is on amantadine '200mg'$  BID for arousal 2.  Antithrombotics: -DVT/anticoagulation:  Pharmaceutical: Lovenox             -antiplatelet therapy: ASA/Brilinta.  3. Pain Management:  Tylenol.  --Will add aquatherapy for local measures.  4. Mood/Behavior/Sleep: LCSW to follow for evaluation and support.              -antipsychotic agents: N/A 5. Neuropsych/cognition: This patient may not be fully capable of making decisions on her own behalf. 6. Skin/Wound Care: Routine pressure relief measures 7. Fluids/Electrolytes/Nutrition: Monitor I/O. Check CMET in am.  8. CAS s/p stent: On ASA/Brilinta 9. HTN: Monitor BP TID--continue Losartan '25mg'$  Vitals:   02/16/22 2045 02/17/22 0346  BP: (!) 144/54 (!) 153/60  Pulse: 78 68  Resp: 19 16  Temp: (!) 97.4 F (36.3 C) 97.7 F (36.5 C)  SpO2: 98% 96%    10. Leucocytosis/UTI: Treated with rocephin. 11. AKI: Improved with IVF on board-->d/c and encourage fluid intake.     Latest Ref Rng & Units 02/17/2022    8:00 AM 02/16/2022    4:00 AM 02/13/2022    4:00 AM  BMP  Glucose 70 - 99 mg/dL 109  114  111   BUN 8 - 23 mg/dL '24  25  23   '$ Creatinine 0.44 - 1.00 mg/dL 1.09  1.00  1.06   Sodium 135 - 145 mmol/L 138  135  137   Potassium 3.5 - 5.1 mmol/L 3.7  3.7  3.9   Chloride 98 - 111 mmol/L 110  108  112   CO2 22 - 32 mmol/L '19  17  17   '$ Calcium 8.9 - 10.3 mg/dL 9.0  9.0  8.6     12. Dysphagia: Continue D3, honey thick liquids. Will d/c IVF and monitor hydration status.             --  encourage water protocol 13. Chronic LBP w/neurogenic B/B: Will monitor voiding with PVR checks.              --local measures for back pain (was getting outpatient PT) 14. Headache             -Continue Topamax and Fioricet prn 15. HLD              -Continue crestor 16. COPD             -Albuterol PRN 17. Hypothyroidism             -Continue synthroid    LOS: 1 days A FACE TO FACE EVALUATION WAS PERFORMED  Charlett Blake 02/17/2022, 9:36 AM

## 2022-02-17 NOTE — Progress Notes (Signed)
Patient ID: Julia Deleon, female   DOB: 1945/06/20, 76 y.o.   MRN: [id number] Met with the patient to review current situation, rehab process, team conference and plan of care. Patient very flat with short but appropriate responses, some memory deficits, reported  "bad year", a lot has happened and she cannot remember". Noted spouse works daily and late on T/H, children out of town and cannot assist.  Sister is a retired PT but she would prefer not to ask her to help post discharge; " not sure what we are going to do". Reviewed secondary risk management including HTN, HLD. Discussed medications including Brilinta/ASA  , Topamax (reports HA continues) and dietary modification recommendations. Patient reported she used peripads PTA post bladder tac; foley in at present.  Reports neck pain, used heating pad in past and tylenol. Continue to follow along to address educational needs to facilitate preparation for discharge. Julia Deleon

## 2022-02-17 NOTE — Progress Notes (Signed)
Pt has been very restless throughout the night and morning.She has been awake for 10 out of the 12 hours. She has been complaining of head, neck, shoulder and sore throat pain. Prn  pain meds and sleep meds were given but not effective. Contacted on call Zella Ball for chloraseptic stray but it was d/c due to patients diet. No further concerns at this time. Call bell in reach.

## 2022-02-17 NOTE — Evaluation (Addendum)
Occupational Therapy Assessment and Plan  Patient Details  Name: Julia Deleon MRN: [id number] Date of Birth: 05-29-1945  OT Diagnosis: acute pain, apraxia, cognitive deficits, flaccid hemiplegia and hemiparesis, hemiplegia affecting non-dominant side, pain in joint, and pain in thoracic spine Rehab Potential: Rehab Potential (ACUTE ONLY): Good ELOS:   21-24 days  Today's Date: 02/17/2022 OT Individual Time: 0800-0900 OT Individual Time Calculation (min): 60 min     Hospital Problem: Principal Problem:   Acute ischemic right MCA stroke ([place])   Past Medical History:  Past Medical History:  Diagnosis Date   Arthritis    Chronic kidney disease    COPD (chronic obstructive pulmonary disease) ([place])    pt denies    Dyspnea    Family history of adverse reaction to anesthesia    father was slow to wake up   Fuch's endothelial dystrophy    GERD (gastroesophageal reflux disease)    Hyperlipidemia    Hypertension    Hypothyroidism    Pneumonia    hx of    PONV (postoperative nausea and vomiting)    slow to wake up and PONV   Past Surgical History:  Past Surgical History:  Procedure Laterality Date   ABDOMINAL HYSTERECTOMY     APPENDECTOMY     BACK SURGERY  09/26/2018   L4-5 PLIF by Dr. Arnoldo Morale   CARDIAC CATHETERIZATION     CHOLECYSTECTOMY     COLONOSCOPY WITH PROPOFOL N/A 10/23/2020   Procedure: COLONOSCOPY WITH PROPOFOL;  Surgeon: Virgel Manifold, MD;  Location: [facility] ENDOSCOPY;  Service: Endoscopy;  Laterality: N/A;   ESOPHAGOGASTRODUODENOSCOPY N/A 10/23/2020   Procedure: ESOPHAGOGASTRODUODENOSCOPY (EGD);  Surgeon: Virgel Manifold, MD;  Location[facility] ENDOSCOPY;  Service: Endoscopy;  Laterality: N/A;   EYE SURGERY     FOOT SURGERY Right    pin removed left   IR CT HEAD LTD  01/29/2022   IR CT HEAD LTD  01/29/2022   IR CT HEAD LTD  01/29/2022   IR INTRA CRAN STENT  01/29/2022   IR INTRAVSC STENT CERV CAROTID W/O EMB-PROT MOD SED INC ANGIO  01/29/2022   IR PERCUTANEOUS  ART THROMBECTOMY/INFUSION INTRACRANIAL INC DIAG ANGIO  01/29/2022   IR US GUIDE VASC ACCESS RIGHT  01/29/2022   laser vein surgery     NASAL SINUS SURGERY     RADIOLOGY WITH ANESTHESIA N/A 01/29/2022   Procedure: IR WITH ANESTHESIA;  Surgeon: Radiologist, Medication, MD;  Location: [place];  Service: Radiology;  Laterality: N/A;   RIGHT/LEFT HEART CATH AND CORONARY ANGIOGRAPHY N/A 12/06/2017   Procedure: RIGHT/LEFT HEART CATH AND CORONARY ANGIOGRAPHY;  Surgeon: Yolonda Kida, MD;  Location: [place] CV LAB;  Service: Cardiovascular;  Laterality: N/A;   ROBOTIC ASSISTED LAPAROSCOPIC SACROCOLPOPEXY Bilateral 07/30/2020   Procedure: XI ROBOTIC ASSISTED LAPAROSCOPIC SACROCOLPOPEXY AND SUPRACERVICAL HYSTERECTOMY WITH BILATERAL SALPINGO OOPHERECTOMY;  Surgeon: Ardis Hughs, MD;  Location: [facility] ORS;  Service: Urology;  Laterality: Bilateral;  REQUESTING 4 HRS   TONSILLECTOMY      Assessment & Plan Clinical Impression: Patient is a 76 y/o female who presents on 9/22 with left sided weakness and slurred speech. Found to have Rt M1 occlusion s/p TNK and mechanical thrombectomy 9/22. Developed bradycardia with hypotension in ICU.  PMH includes COPD, back surgery, CKD.Patient transferred to CIR on 02/16/2022 .    Patient currently requires total to max assist with basic self-care skills, IADLS secondary to muscle weakness, decreased cardiorespiratoy endurance, impaired timing and sequencing, motor apraxia, decreased coordination, and decreased motor  planning, decreased midline orientation, decreased attention to left, left side neglect, decreased motor planning, and ideational apraxia, decreased initiation, decreased attention, decreased awareness, decreased problem solving, decreased safety awareness, decreased memory, and delayed processing, and decreased sitting balance, decreased standing balance, decreased postural control, hemiplegia, and decreased balance strategies.  Prior to hospitalization,  patient could complete ADLS and IALDs with independent .  Patient will benefit from skilled intervention to decrease level of assist with basic self-care skills and increase independence with basic self-care skills prior to discharge to SNF.  Anticipate patient will require minimal physical assistance and follow up home health.  OT - End of Session Endurance Deficit: Yes OT Assessment Rehab Potential (ACUTE ONLY): Good OT Patient demonstrates impairments in the following area(s): Vision;Motor;Safety;Balance;Cognition;Endurance;Perception OT Basic ADL's Functional Problem(s): Grooming;Toileting;Dressing;Bathing OT Transfers Functional Problem(s): Toilet;Tub/Shower OT Additional Impairment(s): Fuctional Use of Upper Extremity OT Plan OT Intensity: Minimum of 1-2 x/day, 45 to 90 minutes OT Frequency: 5 out of 7 days OT Duration: 21-24 days  OT Treatment/Interventions: Balance/vestibular training;Disease mangement/prevention;Self Care/advanced ADL retraining;Neuromuscular re-education;Therapeutic Exercise;Wheelchair propulsion/positioning;Cognitive remediation/compensation;DME/adaptive equipment instruction;Skin care/wound managment;UE/LE Strength taining/ROM;Pain management;Community reintegration;Functional electrical stimulation;Patient/family education;Splinting/orthotics;UE/LE Coordination activities;Functional mobility training;Discharge planning;Therapeutic Activities;Visual/perceptual remediation/compensation;Psychosocial support OT Self Feeding Anticipated Outcome(s): Supervision OT Basic Self-Care Anticipated Outcome(s): Minimal Assistance OT Toileting Anticipated Outcome(s): Minimal asistance OT Bathroom Transfers Anticipated Outcome(s): Minimal Assistance OT Recommendation Patient destination: [facility] (SNF) Follow Up Recommendations: Home health OT Equipment Recommended: 3 in 1 bedside comode;Tub/shower bench;Wheelchair (measurements)   OT Evaluation  Home  Living/Prior Functioning Home Living Living Arrangements: Spouse/significant other Available Help at Discharge: Family, Available 24 hours/day Type of Home: House Home Access: Stairs to enter [employer] of Steps: 1 STE Entrance Stairs-Rails: Right Home Layout: One level Bathroom Shower/Tub: Chiropodist: Standard  Lives With: Spouse IADL History Homemaking Responsibilities: Yes Meal Prep Responsibility: Primary Laundry Responsibility: Primary Cleaning Responsibility: Primary Bill Paying/Finance Responsibility: Primary Shopping Responsibility: Primary Child Care Responsibility: No Current License: Yes Mode of Transportation: Car 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 serving the community and doing things for others Prior Function Level of Independence: Independent with basic ADLs, Independent with gait  Able to Take Stairs?: Yes Driving: Yes Vocation: Retired Leisure: Hobbies-yes (Comment) Vision Baseline Vision/History: 0 No visual deficits (Visual impairement at baseline) Impaired ability to see in adequate light  Patient Visual Report: No change from baseline Vision Assessment?: Yes Ocular Range of Motion: Restricted on the left Alignment/Gaze Preference: Head turned;Gaze right Tracking/Visual Pursuits: Left eye does not track laterally;Left eye does not track medially Convergence: Within functional limits Visual Fields: Left visual field deficit Perception  Perception: Impaired Inattention/Neglect: Does not attend to left side of body;Does not attend to left visual field Praxis Praxis: Impaired Praxis Impairment Details: Ideation;Initiation;Ideomotor;Motor planning Cognition Cognition Overall Cognitive Status: Impaired/Different from baseline Arousal/Alertness: Lethargic Orientation Level: Person;Situation;Place Person: Oriented Place: Oriented Situation:  Oriented Memory: Impaired Memory Impairment: Storage deficit;Decreased recall of new information;Retrieval deficit;Decreased short term memory Decreased Short Term Memory: Verbal basic;Functional basic Attention: Focused;Sustained Focused Attention: Impaired Focused Attention Impairment: Verbal basic;Verbal complex Sustained Attention: Impaired Sustained Attention Impairment: Verbal basic;Functional basic Awareness: Impaired Awareness Impairment: Intellectual impairment;Emergent impairment Problem Solving: Impaired Problem Solving Impairment: Functional basic;Verbal basic Executive Function: Sequencing;Self Correcting;Reasoning;Organizing;Self Monitoring Reasoning: Impaired Reasoning Impairment: Verbal basic;Functional basic Sequencing: Impaired Sequencing Impairment: Functional basic;Verbal basic Organizing: Impaired Organizing Impairment: Verbal basic;Functional basic Self Monitoring: Impaired Self Monitoring Impairment: Verbal basic;Functional basic Self Correcting: Impaired Self Correcting Impairment: Verbal basic;Functional  basic Behaviors: Restless;Perseveration Safety/Judgment: Impaired Brief Interview for Mental Status (BIMS) Repetition of Three Words (First Attempt): 3 Temporal Orientation: Year: Correct Temporal Orientation: Month: Accurate within 5 days Temporal Orientation: Day: Incorrect Recall: "Sock": No, could not recall Recall: "Blue": Yes, no cue required Recall: "Bed": No, could not recall BIMS Summary Score: 10 Sensation Sensation Light Touch: Appears Intact Hot/Cold: Appears Intact Coordination Gross Motor Movements are Fluid and Coordinated: No Fine Motor Movements are Fluid and Coordinated: No Motor  Motor Motor: Hemiplegia Motor - Skilled Clinical Observations: Spasms in LLE, hemiplegic LLE & LUE  Trunk/Postural Assessment  Cervical Assessment Cervical Assessment: Exceptions to [facility] Cervical AROM Overall Cervical AROM: Deficits;Due to  pain Thoracic Assessment Thoracic Assessment: Exceptions to [facility] Thoracic Strength Overall Thoracic Strength: Due to pain Overall Thoracic Strength Comments: Left sided weakness Lumbar Assessment Lumbar Assessment: Exceptions to [facility] Postural Control Postural Control: Deficits on evaluation Trunk Control: Poor trunk control with lean towards right side  Balance Balance Balance Assessed: Yes Static Sitting Balance Static Sitting - Balance Support: Right upper extremity supported;No upper extremity supported;Feet supported Static Sitting - Level of Assistance: 2: Max assist Dynamic Sitting Balance Dynamic Sitting - Balance Support: Right upper extremity supported;No upper extremity supported;Feet supported Dynamic Sitting - Level of Assistance: 2: Max assist Dynamic Sitting - Balance Activities: Lateral lean/weight shifting;Trunk control activities Static Standing Balance Static Standing - Balance Support: During functional activity Static Standing - Level of Assistance: 1: +2 Total assist;1: +1 Total assist Extremity/Trunk Assessment RUE Assessment RUE Assessment: Within Functional Limits LUE Assessment LUE Assessment: Exceptions to [facility] LUE Body System: Neuro Brunstrum levels for arm and hand: Arm;Hand Brunstrum level for arm: Stage I Presynergy Brunstrum level for hand: Stage I Flaccidity LUE AROM (degrees) Overall AROM Left Upper Extremity: Deficits LUE Strength Left Hand Gross Grasp: Impaired  Care Tool Care Tool Self Care Eating   Eating Assist Level: Minimal Assistance - Patient > 75%    Oral Care    Oral Care Assist Level: Moderate Assistance - Patient 50 - 74%    Bathing   Body parts bathed by patient: Left arm;Chest;Abdomen;Face Body parts bathed by helper: Front perineal area;Right lower leg;Buttocks;Right upper leg;Left upper leg;Left lower leg   Assist Level: Total Assistance - Patient < 25%    Upper Body Dressing(including orthotics)   What is the patient  wearing?: Pull over shirt   Assist Level: Total Assistance - Patient < 25%    Lower Body Dressing (excluding footwear)   What is the patient wearing?: Pants;Underwear/pull up Assist for lower body dressing: Total Assistance - Patient < 25%    Putting on/Taking off footwear   What is the patient wearing?: [place] for footwear: Dependent - Patient 0%       Care Tool Toileting Toileting activity   Assist for toileting: Dependent - Patient 0%     Care Tool Bed Mobility Roll left and right activity   Roll left and right assist level: 2 Helpers    Sit to lying activity   Sit to lying assist level: 2 Helpers    Lying to sitting on side of bed activity   Lying to sitting on side of bed assist level: the ability to move from lying on the back to sitting on the side of the bed with no back support.: 2 Helpers     Care Tool Transfers Sit to stand transfer   Sit to stand assist level: 2 Helpers    Chair/bed transfer   Chair/bed transfer assist  level: 2 Armed forces training and education officer transfer activity did not occur: Safety/medical concerns (Not safet to attempt at this time due to Pt functional level; toileting completed at bed level) Assist Level: 2 Helpers     Care Tool Cognition  Expression of Ideas and Wants Expression of Ideas and Wants: 3. Some difficulty - exhibits some difficulty with expressing needs and ideas (e.g, some words or finishing thoughts) or speech is not clear  Understanding Verbal and Non-Verbal Content Understanding Verbal and Non-Verbal Content: 3. Usually understands - understands most conversations, but misses some part/intent of message. Requires cues at times to understand   Memory/Recall Ability Memory/Recall Ability : Current season;That he or she is in a hospital/hospital unit;Staff names and faces   Refer to Care Plan for Long Term Goals  SHORT TERM GOAL WEEK 1 OT Short Term Goal 1 (Week 1): Pt maintain sitting balance during functional  activity for 5 minutes with min A OT Short Term Goal 2 (Week 1): Pt will complete stand pivot bed to wc transfer with mod A OT Short Term Goal 3 (Week 1): Pt will integrate functional use of LUE during grooming tasks 25% of time with mod A OT Short Term Goal 4 (Week 1): Pt will attend to objects on her left side during ADLs with minimal assistance  Recommendations for other services: None    Skilled Therapeutic Intervention  1:1 Occupational therapy evaluation and treatment initiated with education on OT purpose, role, and goals. Pt received laying in bed s/p bowel movement with incontinent bowel. Pt rolled left/right with max A to assist with clean up process. Pt able to roll better to left side better than the right. Pt required max A to donn pants while laying in bed, but was able to partially bridge her hips with max A to assist with pulling pants up over her waist. Pt max A to come to sitting on EOB. Pt significantly pushing to right side while sitting EOB requiring max A to maintain balance. Pt is unaware of midline and required maximal verbal and tactile cues as well as manual facilitation to orient to midline. Pt educated on hemidressing technique and donned her shirt while sitting in her wheelchair with Max A. When given verbal cues to "put on your shirt", Pt was unable to motor plan sequence for next steps in the dressing process. When given more specific verbal cue, increased time, and manual initiation from therapist to put the shirt over her head, the Pt was able to complete putting the shirt over her head. Pt was able to brush her hair while seated in her wheelchair with minimal assistance. Throughout session, pt did not attend to therapist when standing on her left or objects when placed on her left side. Pt unable to visually track to left side at this time. Pt was left sitting in her wheelchair with another therapist present to support her during feeding. All needs were met and call bell  was in reach.   ADL ADL Eating: Minimal assistance;Moderate cueing Where Assessed-Eating: Wheelchair Grooming: Moderate assistance Where Assessed-Grooming: Wheelchair;Sitting at sink Upper Body Bathing: Maximal assistance;Maximal cueing Where Assessed-Upper Body Bathing: Sitting at sink;Wheelchair Lower Body Bathing: Maximal assistance Where Assessed-Lower Body Bathing: Wheelchair;Sitting at sink Upper Body Dressing: Maximal assistance Where Assessed-Upper Body Dressing: Sitting at sink;Wheelchair Lower Body Dressing: Maximal assistance Where Assessed-Lower Body Dressing: Bed level Toileting: Dependent Where Assessed-Toileting: Bed level Toilet Transfer: Not assessed Toilet Transfer Method: Not assessed Mobility  Bed Mobility  Bed Mobility: Rolling Right;Rolling Left;Left Sidelying to Sit;Right Sidelying to Sit;Supine to Sit;Sitting - Scoot to [employer] of Bed Rolling Right: Total Assistance - Patient < 25% Rolling Left: Total Assistance - Patient < 25% Right Sidelying to Sit: Total Assistance - Patient < 25% Left Sidelying to Sit: Total Assistance - Patient < 25% Supine to Sit: Total Assistance - Patient < 25% Sitting - Scoot to Edge of Bed: Total Assistance - Patient < 25%   Discharge Criteria: Patient will be discharged from OT if patient refuses treatment 3 consecutive times without medical reason, if treatment goals not met, if there is a change in medical status, if patient makes no progress towards goals or if patient is discharged from hospital.  The above assessment, treatment plan, treatment alternatives and goals were discussed and mutually agreed upon: by patient  Janey Genta, OTR/L 02/17/2022, 12:34 PM

## 2022-02-17 NOTE — Evaluation (Signed)
Physical Therapy Assessment and Plan  Patient Details  Name: Julia Deleon Frommer MRN: [id number] Date of Birth: 11/30/1945  PT Diagnosis: Abnormal posture, Abnormality of gait, Coordination disorder, Hemiplegia non-dominant, Impaired cognition, Impaired sensation, Muscle spasms, and Muscle weakness Rehab Potential: Good ELOS: 3-4 weeks   Today's Date: 02/17/2022 PT Individual Time: 1535-1645    70 min   Hospital Problem: Principal Problem:   Acute ischemic right MCA stroke [facility])   Past Medical History:  Past Medical History:  Diagnosis Date   Arthritis    Chronic kidney disease    COPD (chronic obstructive pulmonary disease) ([place])    pt denies    Dyspnea    Family history of adverse reaction to anesthesia    father was slow to wake up   Fuch's endothelial dystrophy    GERD (gastroesophageal reflux disease)    Hyperlipidemia    Hypertension    Hypothyroidism    Pneumonia    hx of    PONV (postoperative nausea and vomiting)    slow to wake up and PONV   Past Surgical History:  Past Surgical History:  Procedure Laterality Date   ABDOMINAL HYSTERECTOMY     APPENDECTOMY     BACK SURGERY  09/26/2018   L4-5 PLIF by Dr. Arnoldo Morale   CARDIAC CATHETERIZATION     CHOLECYSTECTOMY     COLONOSCOPY WITH PROPOFOL N/A 10/23/2020   Procedure: COLONOSCOPY WITH PROPOFOL;  Surgeon: Virgel Manifold, MD;  Location: [facility] ENDOSCOPY;  Service: Endoscopy;  Laterality: N/A;   ESOPHAGOGASTRODUODENOSCOPY N/A 10/23/2020   Procedure: ESOPHAGOGASTRODUODENOSCOPY (EGD);  Surgeon: Virgel Manifold, MD;  Location: [facility] ENDOSCOPY;  Service: Endoscopy;  Laterality: N/A;   EYE SURGERY     FOOT SURGERY Right    pin removed left   IR CT HEAD LTD  01/29/2022   IR CT HEAD LTD  01/29/2022   IR CT HEAD LTD  01/29/2022   IR INTRA CRAN STENT  01/29/2022   IR INTRAVSC STENT CERV CAROTID W/O EMB-PROT MOD SED INC ANGIO  01/29/2022   IR PERCUTANEOUS ART THROMBECTOMY/INFUSION INTRACRANIAL INC DIAG ANGIO  01/29/2022   IR  US GUIDE VASC ACCESS RIGHT  01/29/2022   laser vein surgery     NASAL SINUS SURGERY     RADIOLOGY WITH ANESTHESIA N/A 01/29/2022   Procedure: IR WITH ANESTHESIA;  Surgeon: Radiologist, Medication, MD;  Location: [place];  Service: Radiology;  Laterality: N/A;   RIGHT/LEFT HEART CATH AND CORONARY ANGIOGRAPHY N/A 12/06/2017   Procedure: RIGHT/LEFT HEART CATH AND CORONARY ANGIOGRAPHY;  Surgeon: Yolonda Kida, MD;  Location: [place] CV LAB;  Service: Cardiovascular;  Laterality: N/A;   ROBOTIC ASSISTED LAPAROSCOPIC SACROCOLPOPEXY Bilateral 07/30/2020   Procedure: XI ROBOTIC ASSISTED LAPAROSCOPIC SACROCOLPOPEXY AND SUPRACERVICAL HYSTERECTOMY WITH BILATERAL SALPINGO OOPHERECTOMY;  Surgeon: Ardis Hughs, MD;  Location: [facility] ORS;  Service: Urology;  Laterality: Bilateral;  REQUESTING 4 HRS   TONSILLECTOMY      Assessment & Plan Clinical Impression: Patient is a 76 year old female with history of CKD, HTN, Fuch's corneal endothelial dystrophy, mild Schatzki's ring (s/p dilatation 6/22),  chronic LBP w/ neurogenic B/B bladder; who was admitted on 01/29/22 with left sided weakness, left facial droop, dysarthria and left neglect followed by collapse to the floor. She was found to have acute occlusion of proximal M1 with non opacification of right M2/M3 segments  with decreased arborization in right MCA territory, severe stenosis > 75% at origin of R-ICA, high grade narrowing in distal BA and distal V4  segment of R-VA. MRI/MRA revealed moderate stenosis right cavernous ICA, high grade stenosis with asymmetric attenuation of distal MCA branches. She received TNKase and underwent mechanical thrombectomy with TICI2 revascularization with stent,  contrast staining, flow diverters for C[facility] and carotid stent in right carotid bifurcation by Dr. Romie Jumper. Post procedure hypotension with bradycardia treated with dopamine. Dr Gardiner Rhyme felt that bradycardia felt to be due to carotid intervention and should  improve with time a 2 D echo showed  EF 60-65% (biventricular function) and no significant valvular disease.    Follow up CT head 09/26 showed multifocal right hemisphere cytotoxic edema including new involvement of right inferior frontal gyrus, right basal gangli and right superior perirolandic cortex and trace IVH/SAH. She tolerated extubation but continued to have somnolence with lack of movement on the left. Cortak placed for nutritional support and she was started on D3 nectars she started showing improvement in mentation with improvement in orientation but developed significant headaches therefore head CT repeated 10/04 showing evolving infarcts right cerebral hemisphere with no hemorrhage. Topamax added and titrated to 100 mg bid with use of Fioricet bid. Amantadine added to help with activation.  Cortack was d/Deleon but IVF added to manage AKI. Leucocytosis felt to be due to UTI/pyuria and treated with rocephin X 7 days. She reports that she has a history of L4-6 fusion several years ago with and continues to have CLBP. She says she initially required IC for bladder management after her surgery however this has resolved and she was continent of bladder. She denies use of any regular laxatives or bowel medications at home. Therapy has been working with patient who continues to be limited by dense L hemiparesis with left inattention, depressed mood, back and right shoulder pain, needs verbal and tactile cues to follow one step commands with delay and generalized weakness.  Patient transferred to CIR on 02/16/2022 .   Patient currently requires total with mobility secondary to muscle weakness, muscle joint tightness, and muscle paralysis, decreased cardiorespiratoy endurance, impaired timing and sequencing, abnormal tone, unbalanced muscle activation, decreased coordination, and decreased motor planning, decreased visual perceptual skills and field cut, decreased midline orientation, decreased attention to  left, and decreased motor planning, decreased attention, decreased awareness, decreased problem solving, decreased safety awareness, decreased memory, and delayed processing, and decreased sitting balance, decreased standing balance, decreased postural control, hemiplegia, and decreased balance strategies.  Prior to hospitalization, patient was independent  with mobility and lived with Spouse in a House home.  Home access is 1 STEStairs to enter.  Patient will benefit from skilled PT intervention to maximize safe functional mobility, minimize fall risk, and decrease caregiver burden for planned discharge  SNF .  Anticipate patient will  benefit from SNF   at discharge.  PT - End of Session Activity Tolerance: Tolerates < 10 min activity, no significant change in vital signs PT Assessment Rehab Potential (ACUTE/IP ONLY): Good PT Barriers to Discharge: [place] home environment;Decreased caregiver support;Home environment access/layout;Incontinence;Neurogenic Bowel & Bladder;Weight;Medication compliance;Behavior PT Patient demonstrates impairments in the following area(s): Balance;Behavior;Edema;Endurance;Motor;Pain;Perception;Safety PT Transfers Functional Problem(s): Bed Mobility;Bed to Chair;Car;Furniture PT Locomotion Functional Problem(s): Ambulation;Wheelchair Mobility PT Plan PT Intensity: Minimum of 1-2 x/day ,45 to 90 minutes PT Frequency: 5 out of 7 days PT Duration Estimated Length of Stay: 3-4 weeks PT Treatment/Interventions: Ambulation/gait training;Discharge planning;Functional mobility training;Psychosocial support;Therapeutic Activities;Visual/perceptual remediation/compensation;Balance/vestibular training;Disease management/prevention;Neuromuscular re-education;Skin care/wound management;Therapeutic Exercise;Wheelchair propulsion/positioning;Cognitive remediation/compensation;DME/adaptive equipment instruction;Pain management;UE/LE Strength  taining/ROM;Splinting/orthotics;Community reintegration;Functional electrical stimulation;Patient/family education;Stair training;UE/LE Coordination activities PT Transfers Anticipated Outcome(s): Mod  assist with LRAD PT Locomotion Anticipated Outcome(s): WC level at supervision assist. max assist of 1 for ambulation with PT only PT Recommendation Recommendations for Other Services: Therapeutic Recreation consult Therapeutic Recreation Interventions: Stress management Follow Up Recommendations: Skilled nursing facility Patient destination: [place] (SNF) Equipment Recommended: To be determined   PT Evaluation Precautions/Restrictions Precautions Precautions: Fall Precaution Comments: L hemiparesis, L inattention Restrictions Weight Bearing Restrictions: No General   Vital SignsTherapy Vitals Temp: 97.6 F (36.4 Deleon) Temp Source: Oral Pulse Rate: (!) 59 Resp: 15 BP: (!) 129/54 Patient Position (if appropriate): Lying Oxygen Therapy SpO2: 99 % O2 Device: Room Air Pain Pain Assessment Pain Scale: 0-10 Pain Score: 9  Pain Type: Acute pain Pain Location: Head Pain Orientation: Right Pain Descriptors / Indicators: Heaviness Pain Onset: On-going Patients Stated Pain Goal: 2 Pain Intervention(s): Medication (See eMAR);Repositioned Pain Interference Pain Interference Pain Effect on Sleep: 4. Almost constantly Pain Interference with Therapy Activities: 4. Almost constantly Pain Interference with Day-to-Day Activities: 4. Almost constantly Home Living/Prior Functioning Home Living Available Help at Discharge: Family;Available 24 hours/day Type of Home: House Home Access: Stairs to enter [employer] of Steps: 1 STE Entrance Stairs-Rails: Right Home Layout: One level Bathroom Shower/Tub: Chiropodist: Standard  Lives With: Spouse Prior Function Level of Independence: Independent with basic ADLs;Independent with gait  Able to  Take Stairs?: Yes Driving: Yes Vocation: Retired Leisure: Hobbies-yes (Comment) Vision/Perception     Cognition Overall Cognitive Status: Impaired/Different from baseline Arousal/Alertness: Lethargic Attention: Focused;Sustained Focused Attention: Impaired Focused Attention Impairment: Verbal basic;Verbal complex Sustained Attention: Impaired Sustained Attention Impairment: Verbal basic Memory: Impaired Memory Impairment: Storage deficit;Decreased recall of new information;Retrieval deficit Awareness: Impaired Awareness Impairment: Intellectual impairment Problem Solving: Impaired Problem Solving Impairment: Functional basic;Functional complex Executive Function: Sequencing;Self Correcting Sequencing: Impaired Sequencing Impairment: Functional basic;Verbal basic Self Correcting: Impaired Self Correcting Impairment: Verbal basic;Functional basic Behaviors: Restless Safety/Judgment: Impaired Sensation Sensation Light Touch: Impaired Detail Light Touch Impaired Details: Impaired LLE;Impaired LUE Proprioception: Impaired Detail Proprioception Impaired Details: Impaired LLE Additional Comments: extinction and inconsistent appriciation to light touch on the LLE/LUE with formal testing. pt unable to appriciate changes in sensation Coordination Gross Motor Movements are Fluid and Coordinated: No Fine Motor Movements are Fluid and Coordinated: No Coordination and Movement Description: dense hemiplegia on the LUE/LLE Finger Nose Finger Test: unable to perform on the L Heel Shin Test: unable to perform on the L Motor  Motor Motor: Hemiplegia Motor - Skilled Clinical Observations: Spasms in LLE, dense hemiplegia LLE & LUE   Trunk/Postural Assessment  Cervical Assessment Cervical Assessment: Exceptions to [facility] Cervical AROM Overall Cervical AROM: Deficits;Due to pain Thoracic Assessment Thoracic Assessment: Exceptions to [facility] Thoracic Strength Overall Thoracic Strength: Due to  pain Overall Thoracic Strength Comments: Left sided weakness Lumbar Assessment Lumbar Assessment: Exceptions to [facility] Postural Control Postural Control: Deficits on evaluation Trunk Control: Poor trunk control with lean towards right side  Balance Balance Balance Assessed: Yes Static Sitting Balance Static Sitting - Balance Support: Right upper extremity supported;No upper extremity supported;Feet supported Static Sitting - Level of Assistance: 2: Max assist Dynamic Sitting Balance Dynamic Sitting - Balance Support: Right upper extremity supported;No upper extremity supported;Feet supported Dynamic Sitting - Level of Assistance: 2: Max assist Dynamic Sitting - Balance Activities: Lateral lean/weight shifting;Trunk control activities Sitting balance - Comments: pushers syndrome to the L. able to correct for short bouts with cues from PT Static Standing Balance Static Standing - Balance Support: During functional activity Static Standing - Level of  Assistance: 1: +1 Total assist;1: +2 Total assist Extremity Assessment      RLE Assessment RLE Assessment: Within Functional Limits General Strength Comments: grossly 4+5 to 5/5 LLE Assessment LLE Assessment: Exceptions to [facility] General Strength Comments: 1/5 hip/knee flexion/extension in synergy.  Care Tool Care Tool Bed Mobility Roll left and right activity   Roll left and right assist level: Total Assistance - Patient < 25%    Sit to lying activity   Sit to lying assist level: Total Assistance - Patient < 25%    Lying to sitting on side of bed activity   Lying to sitting on side of bed assist level: the ability to move from lying on the back to sitting on the side of the bed with no back support.: Total Assistance - Patient < 25%     Care Tool Transfers Sit to stand transfer   Sit to stand assist level: Total Assistance - Patient < 25%    Chair/bed transfer   Chair/bed transfer assist level: Total Assistance - Patient < 25%      Psychologist, counselling transfer activity did not occur: Safety/medical concerns        Care Tool Locomotion Ambulation Ambulation activity did not occur: Safety/medical concerns        Walk 10 feet activity Walk 10 feet activity did not occur: Safety/medical concerns       Walk 50 feet with 2 turns activity Walk 50 feet with 2 turns activity did not occur: Safety/medical concerns      Walk 150 feet activity Walk 150 feet activity did not occur: Safety/medical concerns      Walk 10 feet on uneven surfaces activity Walk 10 feet on uneven surfaces activity did not occur: Safety/medical concerns      Stairs Stair activity did not occur: Safety/medical concerns        Walk up/down 1 step activity Walk up/down 1 step or curb (drop down) activity did not occur: Safety/medical concerns      Walk up/down 4 steps activity Walk up/down 4 steps activity did not occur: Safety/medical concerns      Walk up/down 12 steps activity Walk up/down 12 steps activity did not occur: Safety/medical concerns      Pick up small objects from floor Pick up small object from the floor (from standing position) activity did not occur: Safety/medical concerns      Wheelchair Is the patient using a wheelchair?: Yes     Wheelchair assist level: Dependent - Patient 0% Max wheelchair distance: 161f in TIS  Wheel 50 feet with 2 turns activity   Assist Level: Dependent - Patient 0%  Wheel 150 feet activity   Assist Level: Dependent - Patient 0%    Refer to Care Plan for Long Term Goals  SHORT TERM GOAL WEEK 1 PT Short Term Goal 1 (Week 1): Pt will sit EOB with mod Assist up to 3 minteus PT Short Term Goal 2 (Week 1): Pt will initiate gait training PT Short Term Goal 3 (Week 1): [facility] PT Short Term Goal 4 (Week 1): Pt will perform sit<>stand with max assist of 1` PT Short Term Goal 5 (Week 1): Pt will perform FIST  assessment  Recommendations for other services: Therapeutic Recreation  Stress management  Skilled Therapeutic Intervention Mobility Bed Mobility Bed Mobility: Rolling Right;Rolling Left;Left Sidelying to Sit;Right Sidelying to Sit;Supine to Sit;Sitting - Scoot to  Edge of Bed Rolling Right: Total Assistance - Patient < 25% Rolling Left: Total Assistance - Patient < 25% Right Sidelying to Sit: Total Assistance - Patient < 25% Left Sidelying to Sit: Total Assistance - Patient < 25% Supine to Sit: Total Assistance - Patient < 25% Sitting - Scoot to Edge of Bed: Total Assistance - Patient < 25% Transfers Transfers: Programmer, applications Transfers: Total Assistance - Patient < 25% Stand Pivot Transfer Details: Manual facilitation for placement;Manual facilitation for weight bearing;Manual facilitation for weight shifting Locomotion  Gait Ambulation: No Gait Gait: No Stairs / Additional Locomotion Stairs: No Wheelchair Mobility Wheelchair Mobility: Yes Wheelchair Assistance: Dependent - Patient 0% Distance: >150 in TIS WC  Pt received supine in bed and agreeable to PT with encouragement. Rolling in bed with max-total A with resistance to roll to the R side due to pushers syndrome. Supine>sit transfer with total  assist and cues for sequencing through log roll. Sitting balance EOB with mod-max assist from PT and RUE supported on bed with max cues for midline. Squat pivot transfer to [facility] with total A and LLE blocked. PT instructed patient in PT Evaluation and initiated treatment intervention; see above for results. PT educated patient in [place], rehab potential, rehab goals, and discharge recommendations along with recommendation for follow-up rehabilitation services.  PT performed WC adjustments to leg rests and head support to improve sitting position, increase sitting tolerance and reduce risk of skin break down. Sit<>stand AT rail with max-total A x 3. Pt reports mild ss of  orthostatic hypotension in standing.  PT unable to perform orthostatic BP assessment. Pt able to tolerate standing up to 10 sec each bout. Noted activation in the LLE with transition to standing initially. Patient returned to room and left sitting in [facility] with call bell in reach and all needs met.   \    Discharge Criteria: Patient will be discharged from PT if patient refuses treatment 3 consecutive times without medical reason, if treatment goals not met, if there is a change in medical status, if patient makes no progress towards goals or if patient is discharged from hospital.  The above assessment, treatment plan, treatment alternatives and goals were discussed and mutually agreed upon: by patient  Lorie Phenix 02/17/2022, 4:51 PM

## 2022-02-17 NOTE — Plan of Care (Signed)
  Problem: RH Swallowing Goal: LTG Patient will consume least restrictive diet using compensatory strategies with assistance (SLP) Description: LTG:  Patient will consume least restrictive diet using compensatory strategies with assistance (SLP) Flowsheets (Taken 02/17/2022 1701) LTG: Pt Patient will consume least restrictive diet using compensatory strategies with assistance of (SLP): Modified Independent Goal: LTG Pt will demonstrate functional change in swallow as evidenced by bedside/clinical objective assessment (SLP) Description: LTG: Patient will demonstrate functional change in swallow as evidenced by bedside/clinical objective assessment (SLP) Flowsheets (Taken 02/17/2022 1701) LTG: Patient will demonstrate functional change in swallow as evidenced by bedside/clinical objective assessment: Oropharyngeal swallow   Problem: RH Problem Solving Goal: LTG Patient will demonstrate problem solving for (SLP) Description: LTG:  Patient will demonstrate problem solving for basic/complex daily situations with cues  (SLP) Flowsheets (Taken 02/17/2022 1701) LTG: Patient will demonstrate problem solving for (SLP): Basic daily situations LTG Patient will demonstrate problem solving for: Minimal Assistance - Patient > 75%   Problem: RH Memory Goal: LTG Patient will demonstrate ability for day to day (SLP) Description: LTG:   Patient will demonstrate ability for day to day recall/carryover during cognitive/linguistic activities with assist  (SLP) Flowsheets (Taken 02/17/2022 1701) LTG: Patient will demonstrate ability for day to day recall: New information LTG: Patient will demonstrate ability for day to day recall/carryover during cognitive/linguistic activities with assist (SLP): Minimal Assistance - Patient > 75% Goal: LTG Patient will use memory compensatory aids to (SLP) Description: LTG:  Patient will use memory compensatory aids to recall biographical/new, daily complex information with cues  (SLP) Flowsheets (Taken 02/17/2022 1701) LTG: Patient will use memory compensatory aids to (SLP): Minimal Assistance - Patient > 75%   Problem: RH Attention Goal: LTG Patient will demonstrate this level of attention during functional activites (SLP) Description: LTG:  Patient will will demonstrate this level of attention during functional activites (SLP) Flowsheets (Taken 02/17/2022 1701) Patient will demonstrate during cognitive/linguistic activities the attention type of: Sustained Patient will demonstrate this level of attention during cognitive/linguistic activities in: Controlled LTG: Patient will demonstrate this level of attention during cognitive/linguistic activities with assistance of (SLP): Minimal Assistance - Patient > 75% Number of minutes patient will demonstrate attention during cognitive/linguistic activities: 15   Problem: RH Awareness Goal: LTG: Patient will demonstrate awareness during functional activites type of (SLP) Description: LTG: Patient will demonstrate awareness during functional activites type of (SLP) Flowsheets (Taken 02/17/2022 1701) Patient will demonstrate during cognitive/linguistic activities awareness type of: Emergent LTG: Patient will demonstrate awareness during cognitive/linguistic activities with assistance of (SLP): Minimal Assistance - Patient > 75%

## 2022-02-17 NOTE — Progress Notes (Signed)
Inpatient Rehabilitation  Patient information reviewed and entered into eRehab system by Blayke Cordrey M. Ardythe Klute, M.A., CCC/SLP, PPS Coordinator.  Information including medical coding, functional ability and quality indicators will be reviewed and updated through discharge.    

## 2022-02-17 NOTE — Progress Notes (Signed)
Nurse called; to d/c chloraseptic stay, she's on dysphasia diet honey thick. She verbalize understanding.

## 2022-02-17 NOTE — Plan of Care (Signed)
  Problem: Sit to Stand Goal: LTG:  Patient will perform sit to stand in prep for activites of daily living with assistance level (OT) Description: LTG:  Patient will perform sit to stand in prep for activites of daily living with minimal assistance DME PRN.   Flowsheets (Taken 02/17/2022 1229) LTG: PT will perform sit to stand in prep for activites of daily living with assistance level: Minimal Assistance - Patient > 75%   Problem: RH Grooming Goal: LTG Patient will perform grooming w/assist,cues/equip (OT) Description: LTG: Patient will perform grooming with min assist while sitting/standing at the sink. Flowsheets (Taken 02/17/2022 1229) LTG: Pt will perform grooming with assistance level of: Minimal Assistance - Patient > 75%   Problem: RH Bathing Goal: LTG Patient will bathe all body parts with assist levels (OT) Description: LTG: Patient will bathe all body parts with moderate assistance and minimal verbal cueing DME and AD PRN.  Flowsheets (Taken 02/17/2022 1229) LTG: Pt will perform bathing with assistance level/cueing: Moderate Assistance - Patient 50 - 74% LTG: Position pt will perform bathing:  At sink  Shower   Problem: RH Dressing Goal: LTG Patient will perform upper body dressing (OT) Description: LTG Patient will perform upper body dressing with min assist. Flowsheets (Taken 02/17/2022 1229) LTG: Pt will perform upper body dressing with assistance level of: Minimal Assistance - Patient > 75% Goal: LTG Patient will perform lower body dressing w/assist (OT) Description: LTG: Patient will perform lower body dressing with moderate assist AD PRN.  Flowsheets (Taken 02/17/2022 1229) LTG: Pt will perform lower body dressing with assistance level of: Moderate Assistance - Patient 50 - 74%   Problem: RH Toileting Goal: LTG Patient will perform toileting task (3/3 steps) with assistance level (OT) Description: LTG: Patient will perform toileting task (3/3 steps) with minimal  assistance. Flowsheets (Taken 02/17/2022 1229) LTG: Pt will perform toileting task (3/3 steps) with assistance level: Minimal Assistance - Patient > 75%   Problem: RH Functional Use of Upper Extremity Goal: LTG Patient will use RT/LT upper extremity as a (OT) Description: LTG: Patient will use LUE as a gross assist without verbal cues during functional activity.  Flowsheets (Taken 02/17/2022 1229) LTG: Use of upper extremity in functional activities: LUE as gross assist level LTG: Pt will use upper extremity in functional activity with assistance level of: Minimal Assistance - Patient > 75%   Problem: RH Toilet Transfers Goal: LTG Patient will perform toilet transfers w/assist (OT) Description: LTG: Patient will perform toilet transfers with min assist DME PRN.  Flowsheets (Taken 02/17/2022 1229) LTG: Pt will perform toilet transfers with assistance level of: Minimal Assistance - Patient > 75%   Problem: RH Tub/Shower Transfers Goal: LTG Patient will perform tub/shower transfers w/assist (OT) Description: LTG: Patient will perform tub/shower transfers with min assist with DME PRN  Flowsheets (Taken 02/17/2022 1229) LTG: Pt will perform tub/shower stall transfers with assistance level of: Minimal Assistance - Patient > 75% LTG: Pt will perform tub/shower transfers from: Tub/shower combination   Problem: RH Memory Goal: LTG Patient will demonstrate ability for day to day recall/carry over during activities of daily living with assistance level (OT) Description: LTG:  Patient will demonstrate ability for day to day recall/carry over during activities of daily living with minimal assistance.  Flowsheets (Taken 02/17/2022 1229) LTG:  Patient will demonstrate ability for day to day recall/carry over during activities of daily living with assistance level (OT): Minimal Assistance - Patient > 75%

## 2022-02-17 NOTE — Evaluation (Signed)
Speech Language Pathology Assessment and Plan  Patient Details  Name: Julia Deleon MRN: [id number] Date of Birth: 1945/05/23  SLP Diagnosis: Cognitive Impairments;Dysphagia  Rehab Potential: Good ELOS: 2-3 weeks    Today's Date: 02/17/2022 SLP Individual Time: 1000-1100 SLP Individual Time Calculation (min): 60 min   Hospital Problem: Principal Problem:   Acute ischemic right MCA stroke ([place])  Past Medical History:  Past Medical History:  Diagnosis Date   Arthritis    Chronic kidney disease    COPD (chronic obstructive pulmonary disease) ([place])    pt denies    Dyspnea    Family history of adverse reaction to anesthesia    father was slow to wake up   Fuch's endothelial dystrophy    GERD (gastroesophageal reflux disease)    Hyperlipidemia    Hypertension    Hypothyroidism    Pneumonia    hx of    PONV (postoperative nausea and vomiting)    slow to wake up and PONV   Past Surgical History:  Past Surgical History:  Procedure Laterality Date   ABDOMINAL HYSTERECTOMY     APPENDECTOMY     BACK SURGERY  09/26/2018   L4-5 PLIF by Dr. Arnoldo Morale   CARDIAC CATHETERIZATION     CHOLECYSTECTOMY     COLONOSCOPY WITH PROPOFOL N/A 10/23/2020   Procedure: COLONOSCOPY WITH PROPOFOL;  Surgeon: Virgel Manifold, MD;  Location: [facility] ENDOSCOPY;  Service: Endoscopy;  Laterality: N/A;   ESOPHAGOGASTRODUODENOSCOPY N/A 10/23/2020   Procedure: ESOPHAGOGASTRODUODENOSCOPY (EGD);  Surgeon: Virgel Manifold, MD;  Location: [facility] ENDOSCOPY;  Service: Endoscopy;  Laterality: N/A;   EYE SURGERY     FOOT SURGERY Right    pin removed left   IR CT HEAD LTD  01/29/2022   IR CT HEAD LTD  01/29/2022   IR CT HEAD LTD  01/29/2022   IR INTRA CRAN STENT  01/29/2022   IR INTRAVSC STENT CERV CAROTID W/O EMB-PROT MOD SED INC ANGIO  01/29/2022   IR PERCUTANEOUS ART THROMBECTOMY/INFUSION INTRACRANIAL INC DIAG ANGIO  01/29/2022   IR US GUIDE VASC ACCESS RIGHT  01/29/2022   laser vein surgery     NASAL SINUS  SURGERY     RADIOLOGY WITH ANESTHESIA N/A 01/29/2022   Procedure: IR WITH ANESTHESIA;  Surgeon: Radiologist, Medication, MD;  Location: [facility];  Service: Radiology;  Laterality: N/A;   RIGHT/LEFT HEART CATH AND CORONARY ANGIOGRAPHY N/A 12/06/2017   Procedure: RIGHT/LEFT HEART CATH AND CORONARY ANGIOGRAPHY;  Surgeon: Yolonda Kida, MD;  Location: [facility] CV LAB;  Service: Cardiovascular;  Laterality: N/A;   ROBOTIC ASSISTED LAPAROSCOPIC SACROCOLPOPEXY Bilateral 07/30/2020   Procedure: XI ROBOTIC ASSISTED LAPAROSCOPIC SACROCOLPOPEXY AND SUPRACERVICAL HYSTERECTOMY WITH BILATERAL SALPINGO OOPHERECTOMY;  Surgeon: Ardis Hughs, MD;  Location: [facility] ORS;  Service: Urology;  Laterality: Bilateral;  REQUESTING 4 HRS   TONSILLECTOMY      Assessment / Plan / Recommendation Clinical Impression HPI:  Patient is a 76 y/o female who presents on 9/22 with left sided weakness and slurred speech. Found to have Rt M1 occlusion s/p TNK and mechanical thrombectomy 9/22. Developed bradycardia with hypotension in ICU.  CT 9/22 Multifocal right hemisphere cytotoxic edema, including new  involvement of the right inferior frontal gyrus, right basal  ganglia, and the superior perirolandic cortex;  Trace IVH and SAH  PMH includes COPD, back surgery (L4-5 fusion), CKD. Admitted to CIR on 02/16/2022.  SLP consulted to complete clinical swallow evaluation (CSE) and cognitive-linguistic evaluations in the setting of acute R MCA CVA.  Per OME, pt presents with decreased L orofacial ROM and strength. Nevertheless, no clinical indications concerning for airway compromise with current diet textures (Dysphagia 3 and nectar-thick liquids). Oral phase appeared functional for oral manipulation, mastication, and A-P transit of soft solid textures and swallow initiation appeared brisk during consumption of nectar-thick liquid boluses via straw. Following oral care and waiting at least 15 minutes after eating her breakfast, SLP  administered controlled trials of ice chips and thin liquid via tsp. During these trials, pt exhibited intermittent + reflexive cough and throat clear which occurred during and immediately post-swallow. Vocal quality clear following cough and throat clear. Will proceed with MBSS when clinically appropriate. Continue to recommend Dysphagia 3 textures with nectar-thick liquids; medications whole or crushed in puree.   Per [facility] SLUMS cognitive screener, pt presents with at least moderate cognitive-linguistic impairments as evident by pt achieving a score of 13/30 (n=27 for individuals with at least a HS diploma). More specifically, all five domains of cognition appear impaired: attention, memory, visuospatial skills, language, and executive functioning skills. Receptive and expressive language appears grossly intact for basic information. Divergent naming and multi-step command following appeared limited due to decreased working memory, processing speed, and mental flexibility. L neglect and pragmatic impairments also noted. Speech appeared fluent and intelligible. It should be noted that pt's performance appeared limited by headache this date; RN notified and provided medication halfway through today's session. Prior to admission, pt was fully independent with all iADL's tasks, was active, driving, and going to the gym 3x/week.  Given the discrepancy between pt's CLOF and PLOF, skilled ST intervention appears indicated at this time. Pt and pt's spouse education on results of today's evaluation and verbalized understanding and agreement to proposed ST POC. Please see below for objective findings.  [place] Examination Orientation  3/3  Numeric Problem Solving  0/3  Generative Naming (Verbal Fluency) 1/3  Memory  0/5  Attention 1/2  Thought Organization 0/3  Clock Drawing 0/4  Visuospatial Skills               2/2  Short Story Recall  6/8  Total  13/30     Scoring  High School Education  Less than  High School Education   Normal  27-30 25-30  Mild Neurocognitive Disorder 21-26 20-24  Dementia  1-20 1-19        Skilled Therapeutic Interventions          CSE and SLUMS administered. Please see above foe details.  SLP Assessment  Patient will need skilled Speech Lanaguage Pathology Services during CIR admission    Recommendations  SLP Diet Recommendations: Dysphagia 3 (Mech soft);Nectar Liquid Administration via: Cup;Straw Medication Administration: Whole meds with puree Supervision: Full supervision/cueing for compensatory strategies;Patient able to self feed Compensations: Small sips/bites;Slow rate;Lingual sweep for clearance of pocketing;Minimize environmental distractions Postural Changes and/or Swallow Maneuvers: Seated upright 90 degrees Oral Care Recommendations: Oral care BID Patient destination: Home Follow up Recommendations: Outpatient SLP;Home Health SLP;24 hour supervision/assistance Equipment Recommended: None recommended by SLP    SLP Frequency 3 to 5 out of 7 days   SLP Duration  SLP Intensity  SLP Treatment/Interventions 2-3 weeks  Minumum of 1-2 x/day, 30 to 90 minutes  Cognitive remediation/compensation;Dysphagia/aspiration precaution training;Functional tasks;Environmental controls;Patient/family education;Medication managment;Internal/external aids    Pain Pain Assessment Pain Scale: 0-10 Pain Score: 9  Pain Type: Acute pain Pain Location: Head Pain Descriptors / Indicators: Headache Pain Onset: On-going Pain Intervention(s): Medication (See eMAR)  Prior Functioning Cognitive/Linguistic Baseline:  Within functional limits Type of Home: House  Lives With: Spouse Available Help at Discharge: Family;Available 24 hours/day Education: 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, systems Overall Cognitive Status: Impaired/Different from baseline Arousal/Alertness: Lethargic Orientation Level: Oriented  X4 Year: 2023 Month: October Day of Week: Correct Attention: Focused;Sustained Focused Attention: Impaired Focused Attention Impairment: Verbal basic;Verbal complex Sustained Attention: Impaired Sustained Attention Impairment: Verbal basic;Functional basic Memory: Impaired Memory Impairment: Storage deficit;Decreased recall of new information;Retrieval deficit;Decreased short term memory Decreased Short Term Memory: Verbal basic;Functional basic Awareness: Impaired Awareness Impairment: Intellectual impairment;Emergent impairment Problem Solving: Impaired Problem Solving Impairment: Functional basic;Verbal basic Executive Function: Sequencing;Self Correcting;Reasoning;Organizing;Self Monitoring Reasoning: Impaired Reasoning Impairment: Verbal basic;Functional basic Sequencing: Impaired Sequencing Impairment: Functional basic;Verbal basic Organizing: Impaired Organizing Impairment: Verbal basic;Functional basic Self Monitoring: Impaired Self Monitoring Impairment: Verbal basic;Functional basic Self Correcting: Impaired Self Correcting Impairment: Verbal basic;Functional basic Behaviors: Restless;Perseveration Safety/Judgment: Impaired  Comprehension Auditory Comprehension Overall Auditory Comprehension: Impaired Yes/No Questions: Impaired Basic Immediate Environment Questions: 75-100% accurate (100%) Complex Questions: 75-100% accurate (100%) Commands: Impaired One Step Basic Commands: 75-100% accurate (100%) Two Step Basic Commands: 75-100% accurate (100%) Complex Commands: 50-74% accurate (~50% accuracy due to decreased processing and working memory.) Conversation: Simple Interfering Components: Attention;Working Financial planner: Not tested Reading Comprehension Reading Status: Not tested Expression Expression Primary Mode of Expression: Verbal Verbal Expression Overall Verbal Expression: Appears within  functional limits for tasks assessed Initiation: No impairment Level of Generative/Spontaneous Verbalization: Word Repetition: No impairment Naming: No impairment Pragmatics: Impairment Impairments: Abnormal affect;Eye contact;Monotone Interfering Components: Attention Written Expression Dominant Hand: Right Written Expression: Not tested Oral Motor Oral Motor/Sensory Function Overall Oral Motor/Sensory Function: Mild impairment Facial ROM: Reduced left Facial Symmetry: Abnormal symmetry left Facial Strength: Reduced left Lingual ROM: Within Functional Limits Lingual Symmetry: Within Functional Limits Lingual Strength: Reduced Mandible: Impaired Motor Speech Overall Motor Speech: Appears within functional limits for tasks assessed Respiration: Within functional limits Phonation: Normal Resonance: Within functional limits Articulation: Within functional limitis Intelligibility: Intelligible Motor Planning: Witnin functional limits Motor Speech Errors: Not applicable  Care Tool Care Tool Cognition Ability to hear (with hearing aid or hearing appliances if normally used Ability to hear (with hearing aid or hearing appliances if normally used): 0. Adequate - no difficulty in normal conservation, social interaction, listening to TV   Expression of Ideas and Wants Expression of Ideas and Wants: 3. Some difficulty - exhibits some difficulty with expressing needs and ideas (e.g, some words or finishing thoughts) or speech is not clear   Understanding Verbal and Non-Verbal Content Understanding Verbal and Non-Verbal Content: 3. Usually understands - understands most conversations, but misses some part/intent of message. Requires cues at times to understand  Memory/Recall Ability Memory/Recall Ability : Current season;That he or she is in a hospital/hospital unit;Staff names and faces   Bedside Swallowing Assessment General Date of Onset: 01/29/22 Previous Swallow Assessment: MBSS on  02/03/2022 Diet Prior to this Study: Dysphagia 3 (soft);Nectar-thick liquids Temperature Spikes Noted: No Respiratory Status: Room air History of Recent Intubation: Yes Length of Intubations (days):  (procedure only) Behavior/Cognition: Cooperative;Requires cueing;Distractible Oral Cavity - Dentition: Adequate natural dentition Self-Feeding Abilities: Needs assist;Able to feed self Patient Positioning: Upright in chair/Tumbleform Baseline Vocal Quality: Normal Volitional Cough: Strong Volitional Swallow: Able to elicit  Ice Chips Ice chips: Impaired Presentation: Spoon Pharyngeal Phase Impairments: Throat Clearing - Immediate Thin Liquid Thin Liquid: Impaired Presentation: Spoon Pharyngeal  Phase Impairments: Cough - Immediate;Throat Clearing - Immediate Nectar Thick Nectar  Thick Liquid: Within functional limits Presentation: Straw Honey Thick Honey Thick Liquid: Not tested Puree Puree: Within functional limits Presentation: Self Fed;Spoon Solid Solid: Within functional limits Presentation: Self Fed BSE Assessment Suspected Esophageal Findings Suspected Esophageal Findings:  (N/A) Risk for Aspiration Impact on safety and function: Mild aspiration risk Other Related Risk Factors: Deconditioning;Cognitive impairment  Short Term Goals: Week 1: SLP Short Term Goal 1 (Week 1): Pt will participate in controlled trials of ice chips and thin water with minimal s/sx concerning for airway compromise to prepare for repeat instrumental swallow study. SLP Short Term Goal 2 (Week 1): Pt will sustain attention to therapeutic tasks for 10 minutes with Min A for redirection. SLP Short Term Goal 3 (Week 1): Pt will verbalize 3 physical and/or cognitive deficits s/p CVA given Max A. SLP Short Term Goal 4 (Week 1): Pt will recall 3 pieces of functional information by utilizing external aids with Max A. SLP Short Term Goal 5 (Week 1): Pt will attend to L visual field during table top tasks  given Max A for use of compensatory visual attention strategies. SLP Short Term Goal 6 (Week 1): Pt will participate in basic problem-solving tasks given Max A.  Refer to Care Plan for Long Term Goals  Recommendations for other services: None   Discharge Criteria: Patient will be discharged from SLP if patient refuses treatment 3 consecutive times without medical reason, if treatment goals not met, if there is a change in medical status, if patient makes no progress towards goals or if patient is discharged from hospital.  The above assessment, treatment plan, treatment alternatives and goals were discussed and mutually agreed upon: by patient and by family  Romelle Starcher A Aariz Maish 02/17/2022, 12:06 PM

## 2022-02-17 NOTE — Progress Notes (Signed)
[facility] Individual Statement of Services  Patient Name:  Julia Deleon  Date:  02/17/2022  Welcome to the [facility].  Our goal is to provide you with an individualized program based on your diagnosis and situation, designed to meet your specific needs.  With this comprehensive rehabilitation program, you will be expected to participate in at least 3 hours of rehabilitation therapies Monday-Friday, with modified therapy programming on the weekends.  Your rehabilitation program will include the following services:  Physical Therapy (PT), Occupational Therapy (OT), Speech Therapy (ST), 24 hour per day rehabilitation nursing, Therapeutic Recreaction (TR), Neuropsychology, Care Coordinator, Rehabilitation Medicine, Nutrition Services, Pharmacy Services, and Other  Weekly team conferences will be held on Wednesdays to discuss your progress.  Your Inpatient Rehabilitation Care Coordinator will talk with you frequently to get your input and to update you on team discussions.  Team conferences with you and your family in attendance may also be held.  Expected length of stay: 12-14 Days  Overall anticipated outcome:  Supervision  Depending on your progress and recovery, your program may change. Your Inpatient Rehabilitation Care Coordinator will coordinate services and will keep you informed of any changes. Your Inpatient Rehabilitation Care Coordinator's name and contact numbers are listed  below.  The following services may also be recommended but are not provided by the [facility]:   [place] will be made to provide these services after discharge if needed.  Arrangements include referral to agencies that provide these services.  Your insurance has been verified to be:   Medicare A & B Your primary doctor is:  Fulton Reek, MD  Pertinent information will  be shared with your doctor and your insurance company.  Inpatient Rehabilitation Care Coordinator:  Erlene Quan, [place] or [phone]  Information discussed with and copy given to patient by: [name], 02/17/2022, 9:37 AM

## 2022-02-18 LAB — URINALYSIS, ROUTINE W REFLEX MICROSCOPIC
Bilirubin Urine: NEGATIVE
Glucose, UA: NEGATIVE mg/dL
Hgb urine dipstick: NEGATIVE
Ketones, ur: NEGATIVE mg/dL
Nitrite: NEGATIVE
Protein, ur: NEGATIVE mg/dL
Specific Gravity, Urine: 1.016 (ref 1.005–1.030)
WBC, UA: >50 WBC/hpf — ABNORMAL HIGH (ref 0–5)
pH: 5 (ref 5.0–8.0)

## 2022-02-18 LAB — GLUCOSE, CAPILLARY
Glucose-Capillary: 93 mg/dL (ref 70–99)
Glucose-Capillary: 105 mg/dL — ABNORMAL HIGH (ref 70–99)
Glucose-Capillary: 104 mg/dL — ABNORMAL HIGH (ref 70–99)

## 2022-02-18 MED ORDER — FOSFOMYCIN TROMETHAMINE 3 G PO PACK
3.0000 g | PACK | Freq: Once | ORAL | Status: AC
  Administered 2022-02-18: 3 g via ORAL
  Filled 2022-02-18 (×2): qty 3

## 2022-02-18 MED ORDER — PHENAZOPYRIDINE HCL 100 MG PO TABS
100.0000 mg | ORAL_TABLET | Freq: Two times a day (BID) | ORAL | Status: AC
  Administered 2022-02-18 – 2022-02-21 (×6): 100 mg via ORAL
  Filled 2022-02-18 (×6): qty 1

## 2022-02-18 NOTE — Progress Notes (Signed)
Occupational Therapy Session Note  Patient Details  Name: Julia Deleon MRN: [id number] Date of Birth: 01/13/1946  Today's Date: 02/18/2022 OT Individual Time: 0900-1000 OT Individual Time Calculation (min): 60 min    Short Term Goals: Week 1:  OT Short Term Goal 1 (Week 1): Pt maintain sitting balance during functional activity for 5 minutes with min A OT Short Term Goal 2 (Week 1): Pt will complete stand pivot bed to wc transfer with mod A OT Short Term Goal 3 (Week 1): Pt will integrate functional use of LUE during grooming tasks 25% of time with mod A OT Short Term Goal 4 (Week 1): Pt will attend to objects on her left side during ADLs with minimal assistance  Skilled Therapeutic Interventions/Progress Updates:     Pt received laying in bed with MD present in room. Pt soiled incontinent bowel and bladder movement upon OT arrival. Pt assisted OT in completing peri care rolling L/R in bed using guard rail to assist. Pt total A to roll onto R side and mod-max A to roll onto L side. Pt bridged hips with max A. Pt educated on log rolling technique and completed transfer to EOB with max A x2. Upon sitting, Pt significantly pushing towards left side requiring max A to maintain balance. Pt cued on weight shifting towards R side and strategies for aligning posture then requiring min to mod A to sit EOB for ~3 minutes. Pt donned pants sitting EOB with max A. Pt completed squat pivot transfer to wc with max Ax2. Pt able to complete sit to stand from wc max A. Pt doffed shirt using modified dressing technique with mod A.  Pt completed UB bathing using right hand to wash her chest, face, L arm, and abdomen, and HOH A to initiate functional use of her LUE to wash the right side of her body. Pt donned deodorant using modified technique with minimal assistance. Pt donned shirt using modified dressing technique while sitting in wc with max A. Pt left sitting in her wc with arm and leg in anti-deformity positions  with pillow placed between knees and LUE supported. Call bell in reach, seat belt alarm on, and all needs met.   Therapy Documentation Precautions:  Precautions Precautions: Fall Precaution Comments: L hemiparesis, L inattention Restrictions Weight Bearing Restrictions: No General:   Vital Signs: Therapy Vitals Temp: 97.7 F (36.5 C) Pulse Rate: 69 Resp: 15 BP: (Abnormal) 158/85 Patient Position (if appropriate): Sitting Oxygen Therapy SpO2: 100 % O2 Device: Room Air Pain: Pain Assessment Pain Scale: 0-10 Pain Score: 10-Worst pain ever Pain Type: Acute pain Pain Location: Head Pain Orientation: Right Pain Descriptors / Indicators: Headache Pain Onset: On-going Patients Stated Pain Goal: 5 Pain Intervention(s): Medication (See eMAR) ADL: ADL Eating: Minimal assistance, Moderate cueing Where Assessed-Eating: Wheelchair Grooming: Moderate assistance Where Assessed-Grooming: Wheelchair, Sitting at sink Upper Body Bathing: Maximal assistance, Maximal cueing Where Assessed-Upper Body Bathing: Sitting at sink, Wheelchair Lower Body Bathing: Maximal assistance Where Assessed-Lower Body Bathing: Wheelchair, Sitting at sink Upper Body Dressing: Maximal assistance Where Assessed-Upper Body Dressing: Sitting at sink, Wheelchair Lower Body Dressing: Maximal assistance Where Assessed-Lower Body Dressing: Edge of bed Toileting: Dependent Where Assessed-Toileting: Bed level Toilet Transfer: Not assessed Toilet Transfer Method: Not assessed   Therapy/Group: Individual Therapy  Lori A Potter, OTR/L 02/18/2022, 3:48 PM 

## 2022-02-18 NOTE — Progress Notes (Signed)
Physical Therapy Session Note  Patient Details  Name: Tonimarie C Goslin MRN: [id number] Date of Birth: 05-01-1946  Today's Date: 02/18/2022 PT Individual Time: 1305-1400 PT Individual Time Calculation (min): 55 min   Short Term Goals: Week 1:  PT Short Term Goal 1 (Week 1): Pt will sit EOB with mod Assist up to 3 minteus PT Short Term Goal 2 (Week 1): Pt will initiate gait training PT Short Term Goal 3 (Week 1): Pt will trasnfer to [facility] with max assist of 1 PT Short Term Goal 4 (Week 1): Pt will perform sit<>stand with max assist of 1` PT Short Term Goal 5 (Week 1): Pt will perform FIST assessment   Skilled Therapeutic Interventions/Progress Updates:   Pt received sitting in WC and agreeable to PT. Seated NMR for LLE hip flexion/extension. HS stretch to reduce spasticity 2 x 2 min LLE. Sit<>stand transfer training at rail in hall with max assist and LUE supported in sling from gait belt x 8 throughout session. standing tolerance at rail in hall 10-15 sec with max encouragement to improve posture participation, hip extension and midline awareness with visual feedback from mirror. Kinetron reciprocal movement 2 x 45sec with max assist from PT for full ROM on the LLE. Patient returned to room and left sitting in [facility] with call bell in reach and all needs met.          Therapy Documentation Precautions:  Precautions Precautions: Fall Precaution Comments: L hemiparesis, L inattention Restrictions Weight Bearing Restrictions: No  Pain: Pain Assessment Pain Scale: 0-10 Pain Score: 10-Worst pain ever Pain Type: Acute pain Pain Location: Head Pain Orientation: Right Pain Descriptors / Indicators: Headache Pain Onset: On-going Patients Stated Pain Goal: 5 Pain Intervention(s): Medication (See eMAR)    Therapy/Group: Individual Therapy  Lorie Phenix 02/18/2022, 2:04 PM

## 2022-02-18 NOTE — Progress Notes (Signed)
PROGRESS NOTE   Subjective/Complaints:  Required I/O cath after foley removal , pt states she feels like she has a UTI.  C/o frequency   ROS- neg CP, SOB, N/V/D  Objective:   No results found. Recent Labs    02/16/22 0400 02/17/22 0800  WBC 12.1* 9.8  HGB 10.3* 10.8*  HCT 30.3* 33.3*  PLT 294 297    Recent Labs    02/16/22 0400 02/17/22 0800  NA 135 138  K 3.7 3.7  CL 108 110  CO2 17* 19*  GLUCOSE 114* 109*  BUN 25* 24*  CREATININE 1.00 1.09*  CALCIUM 9.0 9.0     Intake/Output Summary (Last 24 hours) at 02/18/2022 0905 Last data filed at 02/18/2022 0835 Gross per 24 hour  Intake 236 ml  Output 994 ml  Net -758 ml         Physical Exam: Vital Signs Blood pressure (!) 130/56, pulse 67, temperature (!) 97.5 F (36.4 C), resp. rate 16, weight 72 kg, SpO2 97 %.   General: No acute distress Mood and affect are appropriate Heart: Regular rate and rhythm no rubs murmurs or extra sounds Lungs: Clear to auscultation, breathing unlabored, no rales or wheezes Abdomen: Positive bowel sounds, soft nontender to palpation, nondistended Extremities: No clubbing, cyanosis, or edema Skin: No evidence of breakdown, no evidence of rash Neurologic: Cranial nerves II through XII intact, motor strength is 5/5 in right and 0/5 deltoid, trace bicep, tricep, grip, 0/5 hip flexor, knee extensors, ankle dorsiflexor and plantar flexor Trace L hip/knee ext synergy  Sensory exam normal sensation to light touch and proprioception in bilateral upper and lower extremities Cerebellar exam normal finger to nose to finger as well as heel to shin in bilateral upper and lower extremities Musculoskeletal: pain with Left shoulder ROM   Assessment/Plan: 1. Functional deficits which require 3+ hours per day of interdisciplinary therapy in a comprehensive inpatient rehab setting. Physiatrist is providing close team supervision and 24  hour management of active medical problems listed below. Physiatrist and rehab team continue to assess barriers to discharge/monitor patient progress toward functional and medical goals  Care Tool:  Bathing    Body parts bathed by patient: Left arm, Chest, Abdomen, Face   Body parts bathed by helper: Front perineal area, Right lower leg, Buttocks, Right upper leg, Left upper leg, Left lower leg     Bathing assist Assist Level: Total Assistance - Patient < 25%     Upper Body Dressing/Undressing Upper body dressing   What is the patient wearing?: Pull over shirt    Upper body assist Assist Level: Total Assistance - Patient < 25%    Lower Body Dressing/Undressing Lower body dressing      What is the patient wearing?: Pants, Underwear/pull up     Lower body assist Assist for lower body dressing: Total Assistance - Patient < 25%     Toileting Toileting    Toileting assist Assist for toileting: 2 Helpers     Transfers Chair/bed transfer  Transfers assist  Chair/bed transfer activity did not occur: Safety/medical concerns  Chair/bed transfer assist level: Total Assistance - Patient < 25%     Locomotion Ambulation  Ambulation assist   Ambulation activity did not occur: Safety/medical concerns  Assist level: 2 helpers   Max distance: Tilt in space wc   Walk 10 feet activity   Assist  Walk 10 feet activity did not occur: Safety/medical concerns        Walk 50 feet activity   Assist Walk 50 feet with 2 turns activity did not occur: Safety/medical concerns         Walk 150 feet activity   Assist Walk 150 feet activity did not occur: Safety/medical concerns         Walk 10 feet on uneven surface  activity   Assist Walk 10 feet on uneven surfaces activity did not occur: Safety/medical concerns         Wheelchair     Assist Is the patient using a wheelchair?: Yes Type of Wheelchair:  (Tilt in space)    Wheelchair assist level:  Dependent - Patient 0% Max wheelchair distance: 169f in TIS    Wheelchair 50 feet with 2 turns activity    Assist        Assist Level: Dependent - Patient 0%   Wheelchair 150 feet activity     Assist      Assist Level: Dependent - Patient 0%   Blood pressure (!) 130/56, pulse 67, temperature (!) 97.5 F (36.4 C), resp. rate 16, weight 72 kg, SpO2 97 %.  Medical Problem List and Plan: 1. Functional deficits secondary to   Right MCA infarct s/p TNK and IR with TICI2c, R ICA stent with left hemiplegia             -patient may  shower             -ELOS/Goals: 12-14 days             -Admit to CIR             -PRAFO ordered             -She is on amantadine '200mg'$  BID for arousal 2.  Antithrombotics: -DVT/anticoagulation:  Pharmaceutical: Lovenox             -antiplatelet therapy: ASA/Brilinta.  3. Pain Management:  Tylenol.  --Will add aquatherapy for local measures.  4. Mood/Behavior/Sleep: LCSW to follow for evaluation and support.              -antipsychotic agents: N/A 5. Neuropsych/cognition: This patient may not be fully capable of making decisions on her own behalf. 6. Skin/Wound Care: Routine pressure relief measures 7. Fluids/Electrolytes/Nutrition: Monitor I/O. Poor intake Check BMET in am.  8. CAS s/p stent: On ASA/Brilinta 9. HTN: Monitor BP TID--continue Losartan '25mg'$  Vitals:   02/17/22 2039 02/18/22 0332  BP: (!) 144/68 (!) 130/56  Pulse: 64 67  Resp: 16 16  Temp: 97.9 F (36.6 C) (!) 97.5 F (36.4 C)  SpO2: 98% 97%    10. Leucocytosis/UTI: Treated with rocephin. 11. AKI: Improved with IVF on board-->d/c and encourage fluid intake.     Latest Ref Rng & Units 02/17/2022    8:00 AM 02/16/2022    4:00 AM 02/13/2022    4:00 AM  BMP  Glucose 70 - 99 mg/dL 109  114  111   BUN 8 - 23 mg/dL '24  25  23   '$ Creatinine 0.44 - 1.00 mg/dL 1.09  1.00  1.06   Sodium 135 - 145 mmol/L 138  135  137   Potassium 3.5 - 5.1 mmol/L 3.7  3.7  3.9  Chloride 98 -  111 mmol/L 110  108  112   CO2 22 - 32 mmol/L '19  17  17   '$ Calcium 8.9 - 10.3 mg/dL 9.0  9.0  8.6   If BUN climbs above 30 will restart nocturanl IVF  12. Dysphagia: Continue D3, honey thick liquids. Will d/c IVF and monitor hydration status.             --encourage water protocol 13. Urinary retention will cont flomax, I/O caths, add UA C and S as well  14. Headache             -Continue Topamax and Fioricet prn 15. HLD             -Continue crestor 16. COPD             -Albuterol PRN 17. Hypothyroidism             -Continue synthroid    LOS: 2 days A FACE TO FACE EVALUATION WAS PERFORMED  Charlett Blake 02/18/2022, 9:05 AM

## 2022-02-18 NOTE — Group Note (Signed)
Patient Details Name: Julia Deleon MRN: [id number] DOB: 19-Oct-1945 Today's Date: 02/18/2022  Time Calculation:  1200-1230  Group Description: Diner[employer] Club: Patient participated in [employer] with both OT and SLP with focus on self-feeding, dysphagia goals, cognitive goals, and appropriate social interaction within a group environment.    Individual level documentation:  Patient participated in diner's club with focus on dysphagia goals. Patient consumed their lunch meal of dysphasia 3 and nectar thick liquids. Patient consumed meal with one overt s/s of aspiration, suspect due to mixed consistencies and required min assist for use of swallowing compensatory strategies. Recommend patient continue current diet.   Patient participated in diner's club with focus on cognitive goals. Patient required min assist for selective attention in a mildly distracting environment. Patient also required max assist for visual scanning to left field of environment. Patient also demonstrated appropriate social interaction within a group environment with overall supervision assist.   Pain: Pain Assessment Pain Scale: 0-10 Pain Score: 10-Worst pain ever Pain Type: Acute pain Pain Location: Head Pain Orientation: Right Pain Descriptors / Indicators: Headache Pain Onset: On-going Patients Stated Pain Goal: 5 Pain Intervention(s): Medication (See eMAR)  Precautions:     Suprena Travaglini, Donalds 02/18/2022, 3:01 PM

## 2022-02-18 NOTE — Plan of Care (Signed)
  Problem: RH Balance Goal: LTG Patient will maintain dynamic sitting balance (PT) Description: LTG:  Patient will maintain dynamic sitting balance with assistance during mobility activities (PT) Flowsheets (Taken 02/18/2022 [phone]) LTG: Pt will maintain dynamic sitting balance during mobility activities with:: Contact Guard/Touching assist Goal: LTG Patient will maintain dynamic standing balance (PT) Description: LTG:  Patient will maintain dynamic standing balance with assistance during mobility activities (PT) Flowsheets (Taken 02/18/2022 0613) LTG: Pt will maintain dynamic standing balance during mobility activities with:: Moderate Assistance - Patient 50 - 74%   Problem: Sit to Stand Goal: LTG:  Patient will perform sit to stand with assistance level (PT) Description: LTG:  Patient will perform sit to stand with assistance level (PT) Flowsheets (Taken 02/18/2022 1607) LTG: PT will perform sit to stand in preparation for functional mobility with assistance level: Moderate Assistance - Patient 50 - 74%   Problem: RH Bed Mobility Goal: LTG Patient will perform bed mobility with assist (PT) Description: LTG: Patient will perform bed mobility with assistance, with/without cues (PT). Flowsheets (Taken 02/18/2022 [phone]) LTG: Pt will perform bed mobility with assistance level of: Minimal Assistance - Patient > 75%   Problem: RH Bed to Chair Transfers Goal: LTG Patient will perform bed/chair transfers w/assist (PT) Description: LTG: Patient will perform bed to chair transfers with assistance (PT). Flowsheets (Taken 02/18/2022 [phone]) LTG: Pt will perform Bed to Chair Transfers with assistance level: Moderate Assistance - Patient 50 - 74%   Problem: RH Car Transfers Goal: LTG Patient will perform car transfers with assist (PT) Description: LTG: Patient will perform car transfers with assistance (PT). Flowsheets (Taken 02/18/2022 [phone]) LTG: Pt will perform car transfers with assist:: Moderate  Assistance - Patient 50 - 74%   Problem: RH Furniture Transfers Goal: LTG Patient will perform furniture transfers w/assist (OT/PT) Description: LTG: Patient will perform furniture transfers  with assistance (OT/PT). Flowsheets (Taken 02/18/2022 [phone]) LTG: Pt will perform furniture transfers with assist:: Moderate Assistance - Patient 50 - 74%   Problem: RH Ambulation Goal: LTG Patient will ambulate in controlled environment (PT) Description: LTG: Patient will ambulate in a controlled environment, # of feet with assistance (PT). Flowsheets (Taken 02/18/2022 [phone]) LTG: Pt will ambulate in controlled environ  assist needed:: Maximal Assistance - Patient 25 - 49% LTG: Ambulation distance in controlled environment: 63f with LRAD and PT only   Problem: RH Wheelchair Mobility Goal: LTG Patient will propel w/c in controlled environment (PT) Description: LTG: Patient will propel wheelchair in controlled environment, # of feet with assist (PT) Flowsheets (Taken 02/18/2022 0613) LTG: Pt will propel w/c in controlled environ  assist needed:: Supervision/Verbal cueing LTG: Propel w/c distance in controlled environment: [id number]: LTG Patient will propel w/c in home environment (PT) Description: LTG: Patient will propel wheelchair in home environment, # of feet with assistance (PT). Flowsheets (Taken 02/18/2022 [phone]: wheelchair distance in controlled environment: 50

## 2022-02-18 NOTE — IPOC Note (Addendum)
Overall Plan of Care [facility]) Patient Details Name: Julia Deleon MRN: [id number] DOB: Feb 11, 1946  Admitting Diagnosis: Acute ischemic right MCA stroke [facility])  Hospital Problems: Principal Problem:   Acute ischemic right MCA stroke [facility])     Functional Problem List: Nursing Bladder, Bowel, Medication Management, Safety, Endurance, Nutrition  PT Balance, Behavior, Edema, Endurance, Motor, Pain, Perception, Safety  OT Vision, Motor, Safety, Balance, Cognition, Endurance, Perception  SLP Cognition, Nutrition  TR         Basic ADL's: OT Grooming, Toileting, Dressing, Bathing     Advanced  ADL's: OT       Transfers: PT Bed Mobility, Bed to Chair, Car, Manufacturing systems engineer, Metallurgist: PT Ambulation, Emergency planning/management officer     Additional Impairments: OT Fuctional Use of Upper Extremity  SLP Swallowing, Social Cognition   Problem Solving, Social Interaction, Memory, Attention, Awareness  TR      Anticipated Outcomes Item Anticipated Outcome  Self Feeding Supervision  Swallowing  Mod I   Basic self-care  Minimal Assistance  Toileting  Minimal asistance   Bathroom Transfers Minimal Assistance  Bowel/Bladder  manage bowel and bladder w mod I assist  Transfers  Mod assist with LRAD  Locomotion  WC level at supervision assist. max assist of 1 for ambulation with PT only  Communication  N/A  Cognition  Min A  Pain  n/a  Safety/Judgment  manage w cues   Therapy Plan: PT Intensity: Minimum of 1-2 x/day ,45 to 90 minutes PT Frequency: 5 out of 7 days PT Duration Estimated Length of Stay: 3-4 weeks OT Intensity: Minimum of 1-2 x/day, 45 to 90 minutes OT Frequency: 5 out of 7 days OT Duration Estimated Length of Stay: 21-24 days SLP Intensity: Minumum of 1-2 x/day, 30 to 90 minutes SLP Frequency: 3 to 5 out of 7 days SLP Duration/Estimated Length of Stay: 2-3 weeks   Team Interventions: Nursing Interventions Disease Management/Prevention, Bladder  Management, Medication Management, Discharge Planning, Bowel Management, Patient/Family Education  PT interventions Ambulation/gait training, Discharge planning, Functional mobility training, Psychosocial support, Therapeutic Activities, Visual/perceptual remediation/compensation, Balance/vestibular training, Disease management/prevention, Neuromuscular re-education, Skin care/wound management, Therapeutic Exercise, Wheelchair propulsion/positioning, Cognitive remediation/compensation, DME/adaptive equipment instruction, Pain management, UE/LE Strength taining/ROM, Splinting/orthotics, Community reintegration, Technical sales engineer stimulation, Barrister's clerk education, IT trainer, UE/LE Coordination activities  OT Interventions Training and development officer, Disease mangement/prevention, Self Care/advanced ADL retraining, Neuromuscular re-education, Therapeutic Exercise, Wheelchair propulsion/positioning, Cognitive remediation/compensation, DME/adaptive equipment instruction, Skin care/wound managment, UE/LE Strength taining/ROM, Pain management, Community reintegration, Functional electrical stimulation, Patient/family education, Splinting/orthotics, UE/LE Coordination activities, Functional mobility training, Discharge planning, Therapeutic Activities, Visual/perceptual remediation/compensation, Psychosocial support  SLP Interventions Cognitive remediation/compensation, Dysphagia/aspiration precaution training, Functional tasks, Environmental controls, Patient/family education, Medication managment, Internal/external aids  TR Interventions    SW/CM Interventions Discharge Planning, Psychosocial Support, Patient/Family Education, Disease Management/Prevention   Barriers to Discharge MD  Medical stability, Neurogenic bowel and bladder, Lack of/limited family support, and Nutritional means  Nursing Decreased caregiver support, Home environment access/layout 1 level 8 ste right rail/1 ste via garage w  spouse (works T/H 11a-6p)  PT Inaccessible home environment, Decreased caregiver support, Home environment access/layout, Incontinence, Neurogenic Bowel & Bladder, Weight, Medication compliance, Behavior    OT      SLP      SW Lack of/limited family support, Decreased caregiver support, Inaccessible home environment SNF if unable to progress as anticipated.   Team Discharge Planning: Destination: PT-Skilled Nursing Facility (SNF) ,[place] (SNF) , SLP-Home Projected Follow-up: PT-Skilled  nursing facility, OT-  Home health OT, SLP-Outpatient SLP, Home Health SLP, 24 hour supervision/assistance Projected Equipment Needs: PT-To be determined, OT- 3 in 1 bedside comode, Tub/shower bench, Wheelchair (measurements), SLP-None recommended by SLP Equipment Details: PT- , OT-  Patient/family involved in discharge planning: PT- Patient,  OT-Patient, SLP-Family member/caregiver, Patient  MD ELOS: 21-23d Medical Rehab Prognosis:  Fair Assessment: The patient has been admitted for CIR therapies with the diagnosis of RIght MCA infarct. The team will be addressing functional mobility, strength, stamina, balance, safety, adaptive techniques and equipment, self-care, bowel and bladder mgt, patient and caregiver education, fluid and nutrition intake via po route. Goals have been set at ModA/MinA. Anticipated discharge destination is SNF.        See Team Conference Notes for weekly updates to the plan of care

## 2022-02-18 NOTE — Progress Notes (Signed)
Pt continues to be restless throughout the night. Prn meds given. Pt foley was removed around 6:30pm on 02/17/21 per nurse M[place]ides. Patient did not void within the 6 hour time frame. In and out was preformed at [facility] resulting in 500. Pt bladder scanned this am at 0535 resulting in 176. Pt has been expressing concerns about reoccurring uti's after having foley removed previously. No signs of uti at this time. Pt is still complaining throughout the night of shoulder, neck, and headache pain. Prn meds were given. No further concerns at this time, call bell in reach.

## 2022-02-18 NOTE — Progress Notes (Signed)
Inpatient Rehabilitation Care Coordinator Assessment and Plan Patient Details  Name: Julia Deleon MRN: [id number] Date of Birth: 04-11-1946  Today's Date: 02/18/2022  Hospital Problems: Principal Problem:   Acute ischemic right MCA stroke [facility])  Past Medical History:  Past Medical History:  Diagnosis Date   Arthritis    Chronic kidney disease    COPD (chronic obstructive pulmonary disease) ([place])    pt denies    Dyspnea    Family history of adverse reaction to anesthesia    father was slow to wake up   Fuch's endothelial dystrophy    GERD (gastroesophageal reflux disease)    Hyperlipidemia    Hypertension    Hypothyroidism    Pneumonia    hx of    PONV (postoperative nausea and vomiting)    slow to wake up and PONV   Past Surgical History:  Past Surgical History:  Procedure Laterality Date   ABDOMINAL HYSTERECTOMY     APPENDECTOMY     BACK SURGERY  09/26/2018   L4-5 PLIF by Dr. Arnoldo Morale   CARDIAC CATHETERIZATION     CHOLECYSTECTOMY     COLONOSCOPY WITH PROPOFOL N/A 10/23/2020   Procedure: COLONOSCOPY WITH PROPOFOL;  Surgeon: Virgel Manifold, MD;  Location: [facility] ENDOSCOPY;  Service: Endoscopy;  Laterality: N/A;   ESOPHAGOGASTRODUODENOSCOPY N/A 10/23/2020   Procedure: ESOPHAGOGASTRODUODENOSCOPY (EGD);  Surgeon: Virgel Manifold, MD;  Location: [facility] ENDOSCOPY;  Service: Endoscopy;  Laterality: N/A;   EYE SURGERY     FOOT SURGERY Right    pin removed left   IR CT HEAD LTD  01/29/2022   IR CT HEAD LTD  01/29/2022   IR CT HEAD LTD  01/29/2022   IR INTRA CRAN STENT  01/29/2022   IR INTRAVSC STENT CERV CAROTID W/O EMB-PROT MOD SED INC ANGIO  01/29/2022   IR PERCUTANEOUS ART THROMBECTOMY/INFUSION INTRACRANIAL INC DIAG ANGIO  01/29/2022   IR US GUIDE VASC ACCESS RIGHT  01/29/2022   laser vein surgery     NASAL SINUS SURGERY     RADIOLOGY WITH ANESTHESIA N/A 01/29/2022   Procedure: IR WITH ANESTHESIA;  Surgeon: Radiologist, Medication, MD;  Location: [place];  Service:  Radiology;  Laterality: N/A;   RIGHT/LEFT HEART CATH AND CORONARY ANGIOGRAPHY N/A 12/06/2017   Procedure: RIGHT/LEFT HEART CATH AND CORONARY ANGIOGRAPHY;  Surgeon: Yolonda Kida, MD;  Location: [facility];  Service: Cardiovascular;  Laterality: N/A;   ROBOTIC ASSISTED LAPAROSCOPIC SACROCOLPOPEXY Bilateral 07/30/2020   Procedure: XI ROBOTIC ASSISTED LAPAROSCOPIC SACROCOLPOPEXY AND SUPRACERVICAL HYSTERECTOMY WITH BILATERAL SALPINGO OOPHERECTOMY;  Surgeon: Ardis Hughs, MD;  Location: [facility] OR[facility];  Service: Urology;  Laterality: Bilateral;  REQUESTING 4 HRS   TONSILLECTOMY     Social History:  reports that she has never smoked. She has never used smokeless tobacco. She reports that she does not drink alcohol and does not use drugs.  Family / Support Systems Spouse/Significant Other: Julia Deleon Children: Julia Deleon Ability/Limitations of Caregiver: Spouse teaches Tues and Thurs 11AM-6PM. Family will establish 24/7. Caregiver Availability: Other (Comment) (Family will attempt to establish 24.7) Family Dynamics: support from spouse, daughter and SIL  Social History Preferred language: English Religion: Curator - How often do you need to have someone help you when you read instructions, pamphlets, or other written material from your doctor or pharmacy?: Never   Abuse/Neglect Abuse/Neglect Assessment Can Be Completed: Yes Physical Abuse: Denies Verbal Abuse: Denies Sexual Abuse: Denies Exploitation of patient/patient's resources: Denies Self-Neglect: Denies  Patient response to: Social Isolation -  How often do you feel lonely or isolated from those around you?: Rarely  Emotional Status Recent Psychosocial Issues: coping  Patient / Family Perceptions, Expectations & Goals Pt/Family understanding of illness & functional limitations: yes Premorbid pt/family roles/activities: Independent, went out daily and driving Anticipated changes in  roles/activities/participation: spouse anticipating superivison goals and will establish 24/7 Pt/family expectations/goals: supervision  [place] Agencies: None Premorbid Home Care/DME Agencies: Other (Comment) ([employer] [place], [facility], [employer], Civil engineer, contracting) Transportation available at discharge: family able to transport Is the patient able to respond to transportation needs?: Yes In the past 12 months, has lack of transportation kept you from medical appointments or from getting medications?: No In the past 12 months, has lack of transportation kept you from meetings, work, or from getting things needed for daily living?: No  Discharge Planning Living Arrangements: Spouse/significant other Support Systems: Spouse/significant other, Other relatives (Daughter and sister) Type of Residence: Private residence (8 steps to enter [employer] garage) Insurance Resources: Chartered certified accountant Resources: Radio broadcast assistant Screen Referred: No Living Expenses: Own Money Management: Patient, Spouse Does the patient have any problems obtaining your medications?: No Home Management: Independent Patient/Family Preliminary Plans: Spouse, Daughter, SIL Care Coordinator Barriers to Discharge: Lack of/limited family support, Decreased caregiver support, Inaccessible home environment Care Coordinator Barriers to Discharge Comments: SNF if unable to progress as anticipated. Care Coordinator Anticipated Follow Up Needs: HH/OP, SNF Expected length of stay: 12-14 Days  Clinical Impression SW made 3x attempts to see patient, patient not in room. Sw will follow up with patient this afternoon.   [name] 02/18/2022, 12:10 PM

## 2022-02-18 NOTE — Group Note (Unsigned)
Patient Details Name: Julia Deleon MRN: [id number] DOB: 1945-06-10 Today's Date: 02/18/2022  Time Calculation:        Group Description: [employer] Club: Patient participated in [employer] with both OT and SLP with focus on self-feeding, dysphagia goals, cognitive goals, and appropriate social interaction within a group environment.    Individual level documentation: Patient participated in diner's club and required {assistance:28279} assist for functional use of {RIGHT/LEFT:20294} UE during self-feeding.   Patient participated in diner's club with focus on dysphagia goals. Patient consumed their lunch meal of {diet with liquids:28280}. Patient consumed meal {consistent/inconsistent:28281} overt s/s of aspiration and required {assistance:28279} assist for use of swallowing compensatory strategies. Recommend patient continue current diet.   Patient participated in diner's club with focus on cognitive goals. Patient required (drop down selection {assistance:28279} assist for selective attention in a mildly distracting environment. Patient also required {assistance:28279} assist for visual scanning to {RIGHT/LEFT:20294} field of environment. Patient also demonstrated appropriate social interaction within a group environment with overall {assistance:28279} assist.   Pain: Pain Assessment Pain Scale: 0-10 Pain Score: 10-Worst pain ever Pain Type: Acute pain Pain Location: Head Pain Orientation: Right Pain Descriptors / Indicators: Headache Pain Onset: On-going Patients Stated Pain Goal: 5 Pain Intervention(s): Medication (See eMAR)  Precautions:     Willeen [facility] 02/18/2022, 1:16 PM

## 2022-02-18 NOTE — Group Note (Signed)
Patient Details Name: Julia Deleon MRN: [id number] DOB: 18-Sep-1945 Today's Date: 02/18/2022  Time Calculation: OT Group Time Calculation OT Group Start Time: 2010 OT Group Stop Time: 0712 OT Group Time Calculation (min): 15 min      Group Description: [employer]: Patient participated in [employer] with both OT and SLP with focus on self-feeding, dysphagia goals, cognitive goals, and appropriate social interaction within a group environment.   Individual level documentation: Patient participated in diner's club and required total assist for functional use of left UE during self-feeding.   Patient participated in diner's club with focus on dysphagia goals. Patient consumed their lunch meal of dysphasia 3 and thin liquids. Patient consumed meal without overt s/s of aspiration and required supervision assist for use of swallowing compensatory strategies. Recommend patient continue current diet.   Patient participated in diner's club with focus on cognitive goals. Patient required (drop down selection min assist for selective attention in a mildly distracting environment. Patient also required mod assist for visual scanning to left field of environment. Patient also demonstrated appropriate social interaction within a group environment with overall min assist.   Pain: Pain Assessment Pain Scale: 0-10 Pain Score: 10-Worst pain ever Pain Type: Acute pain Pain Location: Head Pain Orientation: Right Pain Descriptors / Indicators: Headache Pain Onset: On-going Patients Stated Pain Goal: 5 Pain Intervention(s): Medication (See eMAR)  Precautions:     Willeen Cass [facility] 02/18/2022, 1:26 PM

## 2022-02-18 NOTE — Progress Notes (Signed)
Occupational Therapy Session Note  Patient Details  Name: Julia Deleon MRN: [id number] Date of Birth: [birth year]  Today's Date: 02/18/2022 OT Individual Time: 1445-1530 OT Individual Time Calculation (min): 45 min    Short Term Goals: Week 1:  OT Short Term Goal 1 (Week 1): Pt maintain sitting balance during functional activity for 5 minutes with min A OT Short Term Goal 2 (Week 1): Pt will complete stand pivot bed to wc transfer with mod A OT Short Term Goal 3 (Week 1): Pt will integrate functional use of LUE during grooming tasks 25% of time with mod A OT Short Term Goal 4 (Week 1): Pt will attend to objects on her left side during ADLs with minimal assistance  Skilled Therapeutic Interventions/Progress Updates:    Pt received in TIS wc.  Moved chair in upright position to work on postural alignment, reaching to R and visual scanning to left. Pt c/o R shoulder pain when reaching her arm at sh height.  "I am sore all over my upper body. I think it is when the EMTs were helping me"  Tried to have pt work on sit to stands with R hand on elevated bed rail for support but pt not feeling confident and felt I needed a helper.  2nd OT arrived to assist. Pt worked on rising to stand with max A and held stand for a few minutes with max A and cues to shift weight to her R.  Pt stated she was very tired.  Used slide board with +2 A to her R to transfer to bed with max A and then total to move to supine.  From supine had pt work on AROM of neck and practicing to turn head all the way to her L.  Tried to have her do self ROM of arms but shoulders were too painful. Placed disposable heat packs under her shoulders.  With bent knees had pt start to work on trunk rotation but she fell asleep right away.  Removed wrist cock up and applied resting hand splint.  Donned PRAFO boot on her foot.  Pt sleeping soundly. Bed alarm set and all needs met.   Therapy Documentation Precautions:  Precautions Precautions:  Fall Precaution Comments: L hemiparesis, L inattention Restrictions Weight Bearing Restrictions: No  Pain:  no c/o pain, except under L arm on rib cage she is sore when she is being supported there during transfers.  Tried to use hand more on her upper back.    ADL: ADL Eating: Minimal assistance, Moderate cueing Where Assessed-Eating: Wheelchair Grooming: Moderate assistance Where Assessed-Grooming: Wheelchair, Sitting at sink Upper Body Bathing: Maximal assistance, Maximal cueing Where Assessed-Upper Body Bathing: Sitting at sink, Wheelchair Lower Body Bathing: Maximal assistance Where Assessed-Lower Body Bathing: Wheelchair, Sitting at sink Upper Body Dressing: Maximal assistance Where Assessed-Upper Body Dressing: Sitting at sink, Wheelchair Lower Body Dressing: Maximal assistance Where Assessed-Lower Body Dressing: Bed level Toileting: Dependent Where Assessed-Toileting: Bed level Toilet Transfer: Not assessed Toilet Transfer Method: Not assessed   Therapy/Group: Individual Therapy  [facility] 02/18/2022, 12:21 PM

## 2022-02-19 LAB — GLUCOSE, CAPILLARY
Glucose-Capillary: 106 mg/dL — ABNORMAL HIGH (ref 70–99)
Glucose-Capillary: 111 mg/dL — ABNORMAL HIGH (ref 70–99)
Glucose-Capillary: 91 mg/dL (ref 70–99)
Glucose-Capillary: 138 mg/dL — ABNORMAL HIGH (ref 70–99)

## 2022-02-19 MED ORDER — SENNOSIDES-DOCUSATE SODIUM 8.6-50 MG PO TABS
1.0000 | ORAL_TABLET | Freq: Every evening | ORAL | Status: DC | PRN
  Filled 2022-02-19: qty 1

## 2022-02-19 NOTE — Progress Notes (Signed)
Speech Language Pathology Daily Session Note  Patient Details  Name: Genowefa C Else MRN: [id number] Date of Birth: 29-Nov-1945  Today's Date: 02/19/2022 SLP Individual Time: 0800-0900 SLP Individual Time Calculation (min): 60 min  Short Term Goals: Week 1: SLP Short Term Goal 1 (Week 1): Pt will participate in controlled trials of ice chips and thin water with minimal s/sx concerning for airway compromise to prepare for repeat instrumental swallow study. SLP Short Term Goal 2 (Week 1): Pt will sustain attention to therapeutic tasks for 10 minutes with Min A for redirection. SLP Short Term Goal 3 (Week 1): Pt will verbalize 3 physical and/or cognitive deficits s/p CVA given Max A. SLP Short Term Goal 4 (Week 1): Pt will recall 3 pieces of functional information by utilizing external aids with Max A. SLP Short Term Goal 5 (Week 1): Pt will attend to L visual field during table top tasks given Max A for use of compensatory visual attention strategies. SLP Short Term Goal 6 (Week 1): Pt will participate in basic problem-solving tasks given Max A.  Skilled Therapeutic Interventions: Skilled ST treatment focused on cognitive-linguistic and swallowing goals. Pt greeted sleeping in bed and roused to min verbal stimuli. Pt performed oral care with using suction toothbrush and sup A verbal cues to terminate task as evidenced by spending beyond a suspected reasonable amount of time brushing. Pt washed face with washcloth following set-up A and performed within reasonable duration.   Following oral care, SLP facilitated controlled trials of ice chips without overt s/sx of aspiration and clear vocal quality post swallows. Pt consumed 1/4 tsp of thin water with immediate cough response x2/4 trials. Trials eventually discontinued due to morning med pass. SLP/Nurse facilitated trial of single, whole pill in puree. During initial attempt with applesauce, pt masticated the pill despite max A cues. SLP provided  additional applesauce and nectar thick liquid rinses to for oral clearance. Following extensive education and prompting, attempted a whole pill one additional time with chocolate pudding per pt's request which resulted in incomplete oral clearance of pill. Pt was prompted to orally expel pill which was given back to nurse to crush. Continue crushed medications in applesauce. It was noted protonix is currently in liquid form which is not suitable for consumption due to thin liquid consistency. Nurse stated she would determine if medication could be administered in pill form.   SLP initiated "solving daily math problems" subtest with max A multimodal cues. Pt appeared internally and externally distracted requiring max A verbal redirection cues for attention, working memory, and left visual scanning at the sentence level. Also suspect cognitive fatigue. At one point pt stated "I can't do this right now" but unable to elaborate as to why. Spouse eventually arrived resulting in need for even more verbal redirection due to distractibility. Educated spouse on current diet recommendations and pills crushed in puree at this time. Spouse verbalized understanding through teach back. Patient was left in bed with alarm activated and immediate needs within reach at end of session. Continue per current plan of care.      Pain  Pt reports generalized weakness. Nurse present during session to administer medications.   Therapy/Group: Individual Therapy  Lenetta Piche T Ein Rijo 02/19/2022, 8:10 AM

## 2022-02-19 NOTE — Progress Notes (Signed)
Physical Therapy Session Note  Patient Details  Name: Julia Deleon MRN: [id number] Date of Birth: 06/30/1945  Today's Date: 02/19/2022 PT Individual Time: 1130-1200 and [id number] PT Individual Time Calculation (min): 30 min and 61 min   Short Term Goals: Week 1:  PT Short Term Goal 1 (Week 1): Pt will sit EOB with mod Assist up to 3 minteus PT Short Term Goal 2 (Week 1): Pt will initiate gait training PT Short Term Goal 3 (Week 1): Pt will trasnfer to [facility] with max assist of 1 PT Short Term Goal 4 (Week 1): Pt will perform sit<>stand with max assist of 1` PT Short Term Goal 5 (Week 1): Pt will perform FIST assessment  Skilled Therapeutic Interventions/Progress Updates:  Session 1: Patient seated upright in TIS w/c on entrance to room. Slight tilt in w/c. Patient alert and agreeable to PT session.   Patient with no pain complaint at start of session.  Therapeutic Activity/ NMR: Transfers: Pt performed sit<>stand transfers throughout session using footboard to pt's R side in order to provide UE support as no +2 available. Guided in forward scoot with CGA/ MinA, more posterior position of feet underneath w/c, and forward trunk lean for improved weight shift forward and engagement of LLE. Support provided to L shoulder. Provided verbal cues for technique throughout.  Neuromuscular Re-ed: NMR facilitated during session with focus on standing balance. Pt guided in sit<>stand transfers as well as maintaining upright posture throughout stance. Pt does push with RUE toward L side and requires constant cueing to lean to R. Pt able to maintain stance for 27mn initially prior to related pain in back and neck. Second bout just over 1 min prior to fatigue. NMR performed for improvements in motor control and coordination, balance, sequencing, judgement, and self confidence/ efficacy in performing all aspects of mobility at highest level of independence.   Patient seated upright in TIS w/c with slight tilt  back at end of session with brakes locked, belt alarm set, and all needs within reach. Setup for lunch tray arrival. Pt relating that she is not hungry. Pt encouraged to eat and drink in order to promote continued healing.    Session 2: Patient supine in bed on entrance to room. Sister and BIL present at start of tx and leaving during session. Another sister and BIL arrived later in session. Patient alert and agreeable to PT session. +2 provided from OT for second half of session.   Patient with no pain complaint at start of session while lying supine. But with time in seated position, pt c/o Bil shoulder pain and neck pain. No numerical rating provided.  Therapeutic Activity: Bed Mobility: Pt performed roll to L with MaxA and max cues for positioning and use of RLE and RUE for correct positioning and use. Then roll to R side with good use of RUE and ModA to complete to sidelying. Donned pants in supine with MaxA and vc/tc for hooklying positioning.   Completes supine --> sit from R sidelying and able to bring R elbow underneath her side. Brings RLE off bed with CGA and LLE off bed with max cues and MaxA with pt demonstrating motor initiation. Requires ModA topush up to seated position but then MaxA to attain midline and seated balance. Pt is able to overcome posterior bias that she demonstrated initially.  Continues to require vc to correct lean/ push to L.  Transfers: Pt performed one sit<>stand transfer from EOB to Bil HHA. Requires ModA +2 to perform  and maintain stance. Provided verbal/tactile cues for upright posture, glute set, bringing head up and shoulders back as well as hips forward to improve balance. Block to L knee required initially, but pt able to produce quad set during stance only requiring guard throughout stance.   Neuromuscular Re-ed: NMR facilitated during session with focus on sitting and standing balance, proprioception, midline orientation, decreasing push with RUE. Pt guided  in seated balance activity initially just requiring mod/ MaxA and continuous cues for correction of LOB to L side. Used vc with error augmentation with KP and KR to allow pt to improve proprioception of body positioning. Progressed to reaching to R side for target placed outside of BOS in order to promote active lean to R side with return of R hand to pt's L knee and back out to taget, not allowing for push or lean to L side. Standing balance activity described above to assist with proprioception and midline orientation.   While supine, pt guided in AAROM for LLE focus on performance of hip adduction and abduction in hooklying position, perofrmed heel slide to buttock in short bursts, LE extension, straight leg abd/ add, and bridging with BLE. NMR performed for improvements in motor control and coordination, balance, sequencing, judgement, and self confidence/ efficacy in performing all aspects of mobility at highest level of independence.   Patient supine at end of session with brakes locked, bed alarm set, and all needs within reach. Sister and BIL in room. Hot packs applied to pt's shoulders/ neck. Msg sent to PA and RN re: k-pad order and use for back/ neck/ shoulder pain management..    Therapy Documentation Precautions:  Precautions Precautions: Fall Precaution Comments: L hemiparesis, L inattention Restrictions Weight Bearing Restrictions: No General:   Vital Signs: Therapy Vitals Temp: 97.6 F (36.4 C) Pulse Rate: (!) 107 Resp: 17 BP: (!) 140/70 Patient Position (if appropriate): Sitting Oxygen Therapy SpO2: (!) 82 % O2 Device: Room Air Pain:  Pt c/o Bil shoulder lain throughout mobility in session. C/o increased neck pain at end of session. Addressed with rest, repositioning, and hot packs to areas for pain mgmt.   Therapy/Group: Individual Therapy  Alger Simons PT, DPT, CSRS 02/19/2022, 5:49 PM

## 2022-02-19 NOTE — Progress Notes (Signed)
Occupational Therapy Session Note  Patient Details  Name: Julia Deleon MRN: [id number] Date of Birth: Apr 29, 1946  Today's Date: 02/19/2022 OT Individual Time: 1914-7829 OT Individual Time Calculation (min): 60 min    Short Term Goals: Week 1:  OT Short Term Goal 1 (Week 1): Pt maintain sitting balance during functional activity for 5 minutes with min A OT Short Term Goal 2 (Week 1): Pt will complete stand pivot bed to wc transfer with mod A OT Short Term Goal 3 (Week 1): Pt will integrate functional use of LUE during grooming tasks 25% of time with mod A OT Short Term Goal 4 (Week 1): Pt will attend to objects on her left side during ADLs with minimal assistance  Skilled Therapeutic Interventions/Progress Updates:     Pt received resting in bed with husband present in room. Husband is inquisitive and desires to help out where he can. Pt reported pain in her neck and bilateral shoulders. Pt instructed on lateral neck rotation exercises to looses up neck muscles and decrease pain. K-tape applied to Pt's neck to prevent torticollis and reinforce lifting the head into midline. Arm on body gentle ROM stretches with humeral head proximation completed with Pt in laying supine to assess shoulder subluxation, decreased tone, and prepare for ADLs. Pt log rolled onto her right side and transitioned to sitting with max A and max verbal cueing to push through RUE. Pt required max to mod A to maintain sitting balance. Pt moderately aware of left lateral lean and able to self correct for a few seconds when instructed to use external objects to align body with. Pt completed U/LB dressing while seated EOB with posterior support from second OT. Pt pushing towards left side in sitting and expressing fear of falling over. Pushing decreases when Pt actively using RUE during functional tasks or holding hand palm up in lap. Pt donned shirt using modified strategy with max A and increased time to follow verbal cues. Pt able  to don pants with max A by bringing legs into figure four position one at a time. Pt also able to don socks with forward chaining after sock is placed on her toes by lifting legs into figure four position. Pt completed sit to stand and stand pivot transfer to TIS WC with mod Ax2. Pt completed grooming tasks washing her face, brushing her hair, and brushing her teeth with suction tooth brush while seated in her wc. Toiletry items presented on let to increase awareness with moderate verbal cues provided to visually scan to left side. Pt provided with padded wc arm rest and receptive to use. Pt left sitting in her wc with seat belt alarm on, call bell in reach, and all needs met.   Therapy Documentation Precautions:  Precautions Precautions: Fall Precaution Comments: L hemiparesis, L inattention Restrictions Weight Bearing Restrictions: No    ADL: ADL Eating: Minimal assistance, Moderate cueing Where Assessed-Eating: Wheelchair Grooming: Moderate assistance Where Assessed-Grooming: Wheelchair, Sitting at sink Upper Body Bathing: Maximal assistance, Maximal cueing Where Assessed-Upper Body Bathing: Sitting at sink, Wheelchair Lower Body Bathing: Maximal assistance Where Assessed-Lower Body Bathing: Wheelchair, Sitting at sink Upper Body Dressing: Maximal cueing, Maximal assistance Where Assessed-Upper Body Dressing: Edge of bed Lower Body Dressing: Maximal assistance Where Assessed-Lower Body Dressing: Edge of bed Toileting: Dependent Where Assessed-Toileting: Bed level Toilet Transfer: Not assessed Toilet Transfer Method: Not assessed    Therapy/Group: Individual Therapy  Janey Genta, OTR/L 02/19/2022, 12:03 PM

## 2022-02-19 NOTE — Progress Notes (Signed)
PROGRESS NOTE   Subjective/Complaints:  Discussed prior hx of neurogenic bladder related to lumbar spinal stenosis UA + culture pending, multiple antibiotic allergies   ROS- neg CP, SOB, N/V/D  Objective:   No results found. Recent Labs    02/17/22 0800  WBC 9.8  HGB 10.8*  HCT 33.3*  PLT 297    Recent Labs    02/17/22 0800  NA 138  K 3.7  CL 110  CO2 19*  GLUCOSE 109*  BUN 24*  CREATININE 1.09*  CALCIUM 9.0     Intake/Output Summary (Last 24 hours) at 02/19/2022 0826 Last data filed at 02/19/2022 0804 Gross per 24 hour  Intake 118 ml  Output 1766 ml  Net -1648 ml         Physical Exam: Vital Signs Blood pressure (!) 146/70, pulse 74, temperature 97.6 F (36.4 C), temperature source Oral, resp. rate 16, weight 71 kg, SpO2 98 %.   General: No acute distress Mood and affect are appropriate Heart: Regular rate and rhythm no rubs murmurs or extra sounds Lungs: Clear to auscultation, breathing unlabored, no rales or wheezes Abdomen: Positive bowel sounds, soft nontender to palpation, nondistended Extremities: No clubbing, cyanosis, or edema Skin: No evidence of breakdown, no evidence of rash Neurologic: Cranial nerves II through XII intact, motor strength is 5/5 in right and 0/5 deltoid, 2- horz add, trace bicep, tricep, grip, 0/5 hip flexor, knee extensors, ankle dorsiflexor and plantar flexor Trace L hip/knee ext synergy  Sensory exam normal sensation to light touch and proprioception in bilateral upper and lower extremities Cerebellar exam normal finger to nose to finger as well as heel to shin in bilateral upper and lower extremities Musculoskeletal: pain with Left shoulder ROM   Assessment/Plan: 1. Functional deficits which require 3+ hours per day of interdisciplinary therapy in a comprehensive inpatient rehab setting. Physiatrist is providing close team supervision and 24 hour management of  active medical problems listed below. Physiatrist and rehab team continue to assess barriers to discharge/monitor patient progress toward functional and medical goals  Care Tool:  Bathing    Body parts bathed by patient: Left arm, Chest, Abdomen, Face   Body parts bathed by helper: Front perineal area, Right lower leg, Buttocks, Right upper leg, Left upper leg, Left lower leg     Bathing assist Assist Level: Total Assistance - Patient < 25%     Upper Body Dressing/Undressing Upper body dressing   What is the patient wearing?: Pull over shirt    Upper body assist Assist Level: Total Assistance - Patient < 25%    Lower Body Dressing/Undressing Lower body dressing      What is the patient wearing?: Pants, Underwear/pull up     Lower body assist Assist for lower body dressing: Total Assistance - Patient < 25%     Toileting Toileting    Toileting assist Assist for toileting: 2 Helpers     Transfers Chair/bed transfer  Transfers assist  Chair/bed transfer activity did not occur: Safety/medical concerns  Chair/bed transfer assist level: Total Assistance - Patient < 25%     Locomotion Ambulation   Ambulation assist   Ambulation activity did not occur: Safety/medical concerns  Assist level: 2 helpers   Max distance: Tilt in space wc   Walk 10 feet activity   Assist  Walk 10 feet activity did not occur: Safety/medical concerns        Walk 50 feet activity   Assist Walk 50 feet with 2 turns activity did not occur: Safety/medical concerns         Walk 150 feet activity   Assist Walk 150 feet activity did not occur: Safety/medical concerns         Walk 10 feet on uneven surface  activity   Assist Walk 10 feet on uneven surfaces activity did not occur: Safety/medical concerns         Wheelchair     Assist Is the patient using a wheelchair?: Yes Type of Wheelchair:  (Tilt in space)    Wheelchair assist level: Dependent - Patient  0% Max wheelchair distance: 14f in TIS    Wheelchair 50 feet with 2 turns activity    Assist        Assist Level: Dependent - Patient 0%   Wheelchair 150 feet activity     Assist      Assist Level: Dependent - Patient 0%   Blood pressure (!) 146/70, pulse 74, temperature 97.6 F (36.4 C), temperature source Oral, resp. rate 16, weight 71 kg, SpO2 98 %.  Medical Problem List and Plan: 1. Functional deficits secondary to   Right MCA infarct s/p TNK and IR with TICI2c, R ICA stent with left hemiplegia             -patient may  shower             -ELOS/Goals: 12-14 days             -Admit to CIR             -PRAFO ordered             -She is on amantadine '200mg'$  BID for arousal 2.  Antithrombotics: -DVT/anticoagulation:  Pharmaceutical: Lovenox             -antiplatelet therapy: ASA/Brilinta.  3. Pain Management:  Tylenol.  --Will add aquatherapy for local measures.  4. Mood/Behavior/Sleep: LCSW to follow for evaluation and support.              -antipsychotic agents: N/A 5. Neuropsych/cognition: This patient may not be fully capable of making decisions on her own behalf. 6. Skin/Wound Care: Routine pressure relief measures 7. Fluids/Electrolytes/Nutrition: Monitor I/O. Poor intake Check BMET in am.  8. CAS s/p stent: On ASA/Brilinta 9. HTN: Monitor BP TID--continue Losartan '25mg'$  Vitals:   02/18/22 2054 02/19/22 0414  BP: (!) 143/62 (!) 146/70  Pulse: 71 74  Resp: 16 16  Temp: 97.9 F (36.6 C) 97.6 F (36.4 C)  SpO2: 98% 98%    10. Leucocytosis/UTI: Treated with rocephin. 11. AKI: Improved with IVF on board-->d/c and encourage fluid intake.     Latest Ref Rng & Units 02/17/2022    8:00 AM 02/16/2022    4:00 AM 02/13/2022    4:00 AM  BMP  Glucose 70 - 99 mg/dL 109  114  111   BUN 8 - 23 mg/dL '24  25  23   '$ Creatinine 0.44 - 1.00 mg/dL 1.09  1.00  1.06   Sodium 135 - 145 mmol/L 138  135  137   Potassium 3.5 - 5.1 mmol/L 3.7  3.7  3.9   Chloride 98 -  111 mmol/L 110  108  112   CO2 22 - 32 mmol/L '19  17  17   '$ Calcium 8.9 - 10.3 mg/dL 9.0  9.0  8.6   If BUN climbs above 30 will restart nocturanl IVF  12. Dysphagia: Continue D3, honey thick liquids. Will d/c IVF and monitor hydration status.             --encourage water protocol 13. Urinary retention will cont flomax, I/O caths, add UA C and S as well  14. Headache             -Continue Topamax and Fioricet prn 15. HLD             -Continue crestor 16. COPD             -Albuterol PRN 17. Hypothyroidism             -Continue synthroid    LOS: 3 days A FACE TO FACE EVALUATION WAS PERFORMED  Charlett Blake 02/19/2022, 8:26 AM

## 2022-02-20 LAB — GLUCOSE, CAPILLARY
Glucose-Capillary: 110 mg/dL — ABNORMAL HIGH (ref 70–99)
Glucose-Capillary: 148 mg/dL — ABNORMAL HIGH (ref 70–99)
Glucose-Capillary: 118 mg/dL — ABNORMAL HIGH (ref 70–99)
Glucose-Capillary: 116 mg/dL — ABNORMAL HIGH (ref 70–99)

## 2022-02-20 LAB — URINE CULTURE: Culture: >=100000 — AB

## 2022-02-20 MED ORDER — ACETAMINOPHEN 325 MG PO TABS
650.0000 mg | ORAL_TABLET | Freq: Three times a day (TID) | ORAL | Status: DC
  Administered 2022-02-20 – 2022-02-22 (×6): 650 mg via ORAL
  Filled 2022-02-20 (×7): qty 2

## 2022-02-20 NOTE — Progress Notes (Signed)
PROGRESS NOTE   Subjective/Complaints: UC reviewed and shows >100,000 enterococcus, sensitivities pending She complains of headache, discussed trying scheduled tylenol and she is agreeable  ROS- neg CP, SOB, N/V/D, +headache  Objective:   No results found. No results for input(s): "WBC", "HGB", "HCT", "PLT" in the last 72 hours. No results for input(s): "NA", "K", "CL", "CO2", "GLUCOSE", "BUN", "CREATININE", "CALCIUM" in the last 72 hours.  Intake/Output Summary (Last 24 hours) at 02/20/2022 1514 Last data filed at 02/20/2022 0201 Gross per 24 hour  Intake 118 ml  Output 500 ml  Net -382 ml        Physical Exam: Vital Signs Blood pressure (!) 143/74, pulse 69, temperature 98.4 F (36.9 C), temperature source Oral, resp. rate 17, weight 66.3 kg, SpO2 98 %.   Gen: no distress, normal appearing HEENT: oral mucosa pink and moist, NCAT Cardio: Reg rate Chest: normal effort, normal rate of breathing Abd: soft, non-distended Extremities: No clubbing, cyanosis, or edema Skin: No evidence of breakdown, no evidence of rash Neurologic: Cranial nerves II through XII intact, motor strength is 5/5 in right and 0/5 deltoid, 2- horz add, trace bicep, tricep, grip, 0/5 hip flexor, knee extensors, ankle dorsiflexor and plantar flexor Trace L hip/knee ext synergy  Sensory exam normal sensation to light touch and proprioception in bilateral upper and lower extremities Cerebellar exam normal finger to nose to finger as well as heel to shin in bilateral upper and lower extremities Musculoskeletal: pain with Left shoulder ROM   Assessment/Plan: 1. Functional deficits which require 3+ hours per day of interdisciplinary therapy in a comprehensive inpatient rehab setting. Physiatrist is providing close team supervision and 24 hour management of active medical problems listed below. Physiatrist and rehab team continue to assess barriers to  discharge/monitor patient progress toward functional and medical goals  Care Tool:  Bathing    Body parts bathed by patient: Left arm, Chest, Abdomen, Face   Body parts bathed by helper: Front perineal area, Right lower leg, Buttocks, Right upper leg, Left upper leg, Left lower leg     Bathing assist Assist Level: Total Assistance - Patient < 25%     Upper Body Dressing/Undressing Upper body dressing   What is the patient wearing?: Pull over shirt    Upper body assist Assist Level: Total Assistance - Patient < 25%    Lower Body Dressing/Undressing Lower body dressing      What is the patient wearing?: Pants, Underwear/pull up     Lower body assist Assist for lower body dressing: Total Assistance - Patient < 25%     Toileting Toileting    Toileting assist Assist for toileting: 2 Helpers     Transfers Chair/bed transfer  Transfers assist  Chair/bed transfer activity did not occur: Safety/medical concerns  Chair/bed transfer assist level: Total Assistance - Patient < 25%     Locomotion Ambulation   Ambulation assist   Ambulation activity did not occur: Safety/medical concerns      Max distance: Tilt in space wc   Walk 10 feet activity   Assist  Walk 10 feet activity did not occur: Safety/medical concerns        Walk 50 feet  activity   Assist Walk 50 feet with 2 turns activity did not occur: Safety/medical concerns         Walk 150 feet activity   Assist Walk 150 feet activity did not occur: Safety/medical concerns         Walk 10 feet on uneven surface  activity   Assist Walk 10 feet on uneven surfaces activity did not occur: Safety/medical concerns         Wheelchair     Assist Is the patient using a wheelchair?: Yes Type of Wheelchair: Manual    Wheelchair assist level: Dependent - Patient 0% Max wheelchair distance: 154f in TIS    Wheelchair 50 feet with 2 turns activity    Assist        Assist  Level: Dependent - Patient 0%   Wheelchair 150 feet activity     Assist      Assist Level: Dependent - Patient 0%   Blood pressure (!) 143/74, pulse 69, temperature 98.4 F (36.9 C), temperature source Oral, resp. rate 17, weight 66.3 kg, SpO2 98 %.  Medical Problem List and Plan: 1. Functional deficits secondary to   Right MCA infarct s/p TNK and IR with TICI2c, R ICA stent with left hemiplegia             -patient may  shower             -ELOS/Goals: 12-14 days             -Continue CIR             -PRAFO ordered             -She is on amantadine '200mg'$  BID for arousal 2.  Antithrombotics: -DVT/anticoagulation:  Pharmaceutical: Lovenox             -antiplatelet therapy: ASA/Brilinta.  3. Headache: scheduled tylenol '650mg'$  TID --Will add aquatherapy for local measures.  4. Mood/Behavior/Sleep: LCSW to follow for evaluation and support.              -antipsychotic agents: N/A 5. Neuropsych/cognition: This patient may not be fully capable of making decisions on her own behalf. 6. Skin/Wound Care: Routine pressure relief measures 7. Fluids/Electrolytes/Nutrition: Monitor I/O. Poor intake Check BMET in am.  8. CAS s/p stent: On ASA/Brilinta 9. HTN: Monitor BP TID--BP fluctuates, continue Losartan '25mg'$  Vitals:   02/19/22 1945 02/20/22 0306  BP: (!) 120/34 (!) 143/74  Pulse: 67 69  Resp: 17 17  Temp: (!) 97.4 F (36.3 C) 98.4 F (36.9 C)  SpO2: 97% 98%    10. Leucocytosis/UTI: Treated with rocephin. 11. AKI: Improved with IVF on board-->d/c and encourage fluid intake.     Latest Ref Rng & Units 02/17/2022    8:00 AM 02/16/2022    4:00 AM 02/13/2022    4:00 AM  BMP  Glucose 70 - 99 mg/dL 109  114  111   BUN 8 - 23 mg/dL '24  25  23   '$ Creatinine 0.44 - 1.00 mg/dL 1.09  1.00  1.06   Sodium 135 - 145 mmol/L 138  135  137   Potassium 3.5 - 5.1 mmol/L 3.7  3.7  3.9   Chloride 98 - 111 mmol/L 110  108  112   CO2 22 - 32 mmol/L '19  17  17   '$ Calcium 8.9 - 10.3 mg/dL 9.0  9.0   8.6   If BUN climbs above 30 will restart nocturanl IVF  12. Dysphagia: Continue D3,  honey thick liquids. Will d/c IVF and monitor hydration status.             --encourage water protocol 13. Urinary retention will cont flomax, I/O caths, add UA C and S as well  14. Headache             -Continue Topamax and Fioricet prn 15. HLD             -Continue crestor 16. COPD             -Albuterol PRN 17. Hypothyroidism             -Continue synthroid 18. Low back pain: encouraged use of heating pad.     LOS: 4 days A FACE TO FACE EVALUATION WAS PERFORMED  Julia Deleon 02/20/2022, 3:14 PM

## 2022-02-20 NOTE — Progress Notes (Addendum)
Triple lumen PICC routine line care: no current IV orders. Low threshold for dehydration noted. PIV will be adequate if IV hydration is ordered. Please consider PICC removal. Message to RN.

## 2022-02-20 NOTE — Progress Notes (Signed)
Speech Language Pathology Daily Session Note  Patient Details  Name: Julia Deleon MRN: [id number] Date of Birth: November 14, 1945  Today's Date: 02/20/2022 SLP Individual Time: 0998-3382 SLP Individual Time Calculation (min): 45 min  Short Term Goals: Week 1: SLP Short Term Goal 1 (Week 1): Pt will participate in controlled trials of ice chips and thin water with minimal s/sx concerning for airway compromise to prepare for repeat instrumental swallow study. SLP Short Term Goal 2 (Week 1): Pt will sustain attention to therapeutic tasks for 10 minutes with Min A for redirection. SLP Short Term Goal 3 (Week 1): Pt will verbalize 3 physical and/or cognitive deficits s/p CVA given Max A. SLP Short Term Goal 4 (Week 1): Pt will recall 3 pieces of functional information by utilizing external aids with Max A. SLP Short Term Goal 5 (Week 1): Pt will attend to L visual field during table top tasks given Max A for use of compensatory visual attention strategies. SLP Short Term Goal 6 (Week 1): Pt will participate in basic problem-solving tasks given Max A.  Skilled Therapeutic Interventions: Skilled ST treatment focused on swallowing and cognitive goals. Pt was in bed and consuming morning meal upon arrival with nurse at bedside providing supervision. Nurse exited. SLP provided ongoing repositioning in bed due to L lean. Pt reported significant headache and was observed frequently sighing and rubbing head. RN made aware. Pt required extended processing time (30+ seconds) to respond to basic questions during general conversational exchange, required verbal repetition x1-2, and at times did not respond at all. This was consistent with observations yesterday, however may be increased today 2/2 headache and fatigue. Pt with no awareness of visual deficits and required mod-to-max A verbal cues to attend left of midline.  Pt consumed dysphagia 3 textures with mildly prolonged yet effective mastication, oral clearance with  minimal residuals, no evidence of pocketing on L, and without overt s/sx of aspiration. Pt consumed nectar thick liquids by cup with clear vocal quality post swallows and without overt s/sx of aspiration. Recommend continuation of current diet with intermittent supervision. Nurse arrived to administer morning meds. Patient was left in bed with alarm activated and immediate needs within reach at end of session. Continue per current plan of care.      Pain Pain Assessment Pain Scale: 0-10 Pain Score: 7  Pain Type: Acute pain Pain Location: Head Pain Descriptors / Indicators: Headache Pain Onset: On-going Pain Intervention(s): RN made aware Multiple Pain Sites: No  Therapy/Group: Individual Therapy  Julia Deleon 02/20/2022, 8:12 AM

## 2022-02-20 NOTE — Progress Notes (Signed)
Occupational Therapy Session Note  Patient Details  Name: Julia Deleon MRN: [id number] Date of Birth: 08/16/1945  Today's Date: 02/20/2022 OT Individual Time: 1300-1400 OT Individual Time Calculation (min): 60 min    Short Term Goals: Week 1:  OT Short Term Goal 1 (Week 1): Pt maintain sitting balance during functional activity for 5 minutes with min A OT Short Term Goal 2 (Week 1): Pt will complete stand pivot bed to wc transfer with mod A OT Short Term Goal 3 (Week 1): Pt will integrate functional use of LUE during grooming tasks 25% of time with mod A OT Short Term Goal 4 (Week 1): Pt will attend to objects on her left side during ADLs with minimal assistance  Skilled Therapeutic Interventions/Progress Updates:    Pt received in wc finishing lunch. She needed cues to find the rest of her  dessert that was still on the left side of the bowl. Cues to turn head to left to find dessert.  Pt able to self feed.  Pt's spouse and son present. Once pt was finished she requested to change shirts and don a bra.  Son stepped out for awhile. Total A with front closure bra and max A with pullover shirt. Prior to donning shirt did massage to upper back muscles that were painful.  Asked pt to work on standing, had her chair rotated so R side of chair against bed to use R hand on bed rail.  Max to total for sit to partial stand. Even with total A,unable to get pt to stand upright. Pt kept saying "I cant I cant I am too tired."  Pt requesting to lay down saying she was exhausted. RN provided +2 A for slide board to bed to pt's R. Today pt required more A (total of 2) and having more difficulty following directions with where to place her hand.  Pt moved to supine (PICC line team arrived to remove PICC).  During this time, did gentle PROM to LUE.  She does have increased tightness in shoulder. Pt c/o thumb pain. Tried hand drop exercise to stimulate triceps but no motor response.  She does have increased tone in  triceps.  Pt began falling asleep quickly.  Discussed with family the likelihood she will need physical A and 24/7 A and to start thinking about who could A with providing that.  Pt resting in bed with arm positioned on pillows.  Bed alarm set.   Therapy Documentation Precautions:  Precautions Precautions: Fall Precaution Comments: L hemiparesis, L inattention Restrictions Weight Bearing Restrictions: No  Pain: Pain Assessment Pain Scale: 0-10 Pain Score: 7  Pain Type: Acute pain Pain Location: Head Pain Orientation: Right Pain Descriptors / Indicators: Headache Pain Frequency: Constant Pain Onset: On-going Pain Intervention(s): Medication (See eMAR) ADL: ADL Eating: Minimal assistance, Moderate cueing Where Assessed-Eating: Wheelchair Grooming: Moderate assistance Where Assessed-Grooming: Wheelchair, Sitting at sink Upper Body Bathing: Maximal assistance, Maximal cueing Where Assessed-Upper Body Bathing: Sitting at sink, Wheelchair Lower Body Bathing: Maximal assistance Where Assessed-Lower Body Bathing: Wheelchair, Sitting at sink Upper Body Dressing: Maximal cueing, Maximal assistance Where Assessed-Upper Body Dressing: Edge of bed Lower Body Dressing: Maximal assistance Where Assessed-Lower Body Dressing: Edge of bed Toileting: Dependent Where Assessed-Toileting: Bed level Toilet Transfer: Not assessed Toilet Transfer Method: Not assessed   Therapy/Group: Individual Therapy  [place] 02/20/2022, 12:23 PM

## 2022-02-20 NOTE — Progress Notes (Signed)
Physical Therapy Session Note  Patient Details  Name: Julia Deleon MRN: [id number] Date of Birth: November 20, 1945  Today's Date: 02/20/2022 PT Individual Time: 0950-1045 PT Individual Time Calculation (min): 55 min   Short Term Goals: Week 1:  PT Short Term Goal 1 (Week 1): Pt will sit EOB with mod Assist up to 3 minteus PT Short Term Goal 2 (Week 1): Pt will initiate gait training PT Short Term Goal 3 (Week 1): Pt will trasnfer to [facility] with max assist of 1 PT Short Term Goal 4 (Week 1): Pt will perform sit<>stand with max assist of 1` PT Short Term Goal 5 (Week 1): Pt will perform FIST assessment   Skilled Therapeutic Interventions/Progress Updates:   Pt received supine in bed and agreeable to PT. Rolling R and L with max assist from PT to don pants with cues for BLE position and attention to task. Supine>sit transfer with max assist and cues for use of RUE to push into sitting. Sitting balance EOB with min assist x 3 minutes with improved awareness of midline compared to prior sessions with this PT.  Squat pivot transfer to [facility] with total, as pt demonstrating difficulty attending to task due to perseveration on shoulder pain. Repositioned hte LUE with support through elbow with mild reduction in discomfort.  Transported to rehab hall way. Sit<>stand x 5 with visual feed back from mirror with max assist to improved trunkal extension and WB through the LLE once in standing. Pt able to tolerate standing up to 20sec on this day. Pt performed pregait stepping x 2 BLE with cues for posture, weight shift, trunkal and hip extension as well as attention to mirror for visual feedback. LUE supported in gait belt to reduce subluxation on the L shoulder Sit<>stand in standing frame x 2 bouts. Max-total A for management of the LUE. Pt able to tolerate ~ 45 sec each bout in standing with mild hypotension s/s on secon bout in standing, requiring return WC.  Patient returned to room and left sitting in [facility] with call  bell in reach and all needs met.        Therapy Documentation Precautions:  Precautions Precautions: Fall Precaution Comments: L hemiparesis, L inattention Restrictions Weight Bearing Restrictions: No  Pain:  Faces: hurts a little. Shoulder: pt repositioned.     Therapy/Group: Individual Therapy  Lorie Phenix 02/20/2022, 1:04 PM

## 2022-02-20 NOTE — Progress Notes (Signed)
Physical Therapy Session Note  Patient Details  Name: Julia Deleon MRN: [id number] Date of Birth: [birth year]  Today's Date: 02/20/2022 PT Individual Time: 1517-1610 PT Individual Time Calculation (min): 53 min   Short Term Goals: Week 1:  PT Short Term Goal 1 (Week 1): Pt will sit EOB with mod Assist up to 3 minteus PT Short Term Goal 2 (Week 1): Pt will initiate gait training PT Short Term Goal 3 (Week 1): Pt will trasnfer to [facility] with max assist of 1 PT Short Term Goal 4 (Week 1): Pt will perform sit<>stand with max assist of 1` PT Short Term Goal 5 (Week 1): Pt will perform FIST assessment Week 2:     Skilled Therapeutic Interventions/Progress Updates:  Patient supine in bed on entrance to room. Patient alert and agreeable to PT session.   Patient with no pain complaint at start of session.  Therapeutic Activity: Bed Mobility: Pt performed supine --> sit with Mod A. Ble to initiate LLE toward EOB with vc/ tc. Additional vc/ tc required for sequencing UB to complete supine--> sit on EOB.At end of session, pt is able to lower self to elbow, thn requires ModA to lower tosholder as well as to bring Bil feet to bed surface. Using bed features, pt is able to assist with move toward [facility] with ModA.   Neuromuscular Re-ed: NMR facilitated during session with focus on sitting balance and midline orientation. Pt guided in finding midline with vc/ tc and ability to find with CGA. Pt does continue to require max cues in questioning re: posture to correct. LOB to L side and up to [place] to correct but pt is able to reach midline. Pt also guided in LLE extension, heel slides, hip abd/ add in straight leg and hooklying positions, and bridging requiring AAROM to complete all with max cues and ability to initiate.. NMR performed for improvements in motor control and coordination, balance, sequencing, judgement, and self confidence/ efficacy in performing all aspects of mobility at highest level of independence.    Patient supine in bed at end of session with brakes locked, bed alarm set, and all needs within reach.   Therapy Documentation Precautions:  Precautions Precautions: Fall Precaution Comments: L hemiparesis, L inattention Restrictions Weight Bearing Restrictions: No General:   Vital Signs: Therapy Vitals Temp: (!) 97.4 F (36.3 C) Temp Source: Oral Pulse Rate: 71 Resp: 18 BP: (!) 144/96 Patient Position (if appropriate): Lying Oxygen Therapy SpO2: 98 % O2 Device: Room Air Pain:   Mobility:   Locomotion :    Trunk/Postural Assessment :    Balance:   Exercises:   Other Treatments:      Therapy/Group: Individual Therapy  Alger Simons PT, DPT, CSRS 02/20/2022, 6:43 PM

## 2022-02-21 LAB — GLUCOSE, CAPILLARY
Glucose-Capillary: 111 mg/dL — ABNORMAL HIGH (ref 70–99)
Glucose-Capillary: 140 mg/dL — ABNORMAL HIGH (ref 70–99)
Glucose-Capillary: 163 mg/dL — ABNORMAL HIGH (ref 70–99)
Glucose-Capillary: 128 mg/dL — ABNORMAL HIGH (ref 70–99)

## 2022-02-21 MED ORDER — FOSFOMYCIN TROMETHAMINE 3 G PO PACK
3.0000 g | PACK | Freq: Once | ORAL | Status: AC
  Administered 2022-02-21: 3 g via ORAL
  Filled 2022-02-21: qty 3

## 2022-02-21 MED ORDER — AMLODIPINE BESYLATE 5 MG PO TABS
5.0000 mg | ORAL_TABLET | Freq: Every day | ORAL | Status: DC
  Administered 2022-02-21 – 2022-03-04 (×12): 5 mg via ORAL
  Filled 2022-02-21 (×12): qty 1

## 2022-02-21 NOTE — Progress Notes (Addendum)
PROGRESS NOTE   Subjective/Complaints: UC reviewed and shows >100,000 enterococcus, sensitivities pending She finds the heating pad somewhat helpful No issues overnight  ROS- neg CP, SOB, N/V/D, +headache, +shoulder pain  Objective:   No results found. No results for input(s): "WBC", "HGB", "HCT", "PLT" in the last 72 hours. No results for input(s): "NA", "K", "CL", "CO2", "GLUCOSE", "BUN", "CREATININE", "CALCIUM" in the last 72 hours.  Intake/Output Summary (Last 24 hours) at 02/21/2022 0952 Last data filed at 02/21/2022 0620 Gross per 24 hour  Intake 180 ml  Output 1841 ml  Net -1661 ml        Physical Exam: Vital Signs Blood pressure (!) 165/66, pulse 66, temperature 98 F (36.7 C), resp. rate 15, weight 65.7 kg, SpO2 98 %. Gen: no distress, normal appearing HEENT: oral mucosa pink and moist, NCAT Cardio: Reg rate Chest: normal effort, normal rate of breathing Abd: soft, non-distended Ext: no edema Psych: pleasant, normal affect Skin: intact Neurologic: Cranial nerves II through XII intact, motor strength is 5/5 in right and 0/5 deltoid, 2- horz add, trace bicep, tricep, grip, 0/5 hip flexor, knee extensors, ankle dorsiflexor and plantar flexor Trace L hip/knee ext synergy  Sensory exam normal sensation to light touch and proprioception in bilateral upper and lower extremities Cerebellar exam normal finger to nose to finger as well as heel to shin in bilateral upper and lower extremities Musculoskeletal: pain with Left shoulder ROM   Assessment/Plan: 1. Functional deficits which require 3+ hours per day of interdisciplinary therapy in a comprehensive inpatient rehab setting. Physiatrist is providing close team supervision and 24 hour management of active medical problems listed below. Physiatrist and rehab team continue to assess barriers to discharge/monitor patient progress toward functional and medical  goals  Care Tool:  Bathing    Body parts bathed by patient: Left arm, Chest, Abdomen, Face   Body parts bathed by helper: Front perineal area, Right lower leg, Buttocks, Right upper leg, Left upper leg, Left lower leg     Bathing assist Assist Level: Total Assistance - Patient < 25%     Upper Body Dressing/Undressing Upper body dressing   What is the patient wearing?: Pull over shirt    Upper body assist Assist Level: Total Assistance - Patient < 25%    Lower Body Dressing/Undressing Lower body dressing      What is the patient wearing?: Pants, Underwear/pull up     Lower body assist Assist for lower body dressing: Total Assistance - Patient < 25%     Toileting Toileting    Toileting assist Assist for toileting: 2 Helpers     Transfers Chair/bed transfer  Transfers assist  Chair/bed transfer activity did not occur: Safety/medical concerns  Chair/bed transfer assist level: Total Assistance - Patient < 25%     Locomotion Ambulation   Ambulation assist   Ambulation activity did not occur: Safety/medical concerns      Max distance: Tilt in space wc   Walk 10 feet activity   Assist  Walk 10 feet activity did not occur: Safety/medical concerns        Walk 50 feet activity   Assist Walk 50 feet with 2 turns activity  did not occur: Safety/medical concerns         Walk 150 feet activity   Assist Walk 150 feet activity did not occur: Safety/medical concerns         Walk 10 feet on uneven surface  activity   Assist Walk 10 feet on uneven surfaces activity did not occur: Safety/medical concerns         Wheelchair     Assist Is the patient using a wheelchair?: Yes Type of Wheelchair: Manual    Wheelchair assist level: Dependent - Patient 0% Max wheelchair distance: 141f in TIS    Wheelchair 50 feet with 2 turns activity    Assist        Assist Level: Dependent - Patient 0%   Wheelchair 150 feet activity      Assist      Assist Level: Dependent - Patient 0%   Blood pressure (!) 165/66, pulse 66, temperature 98 F (36.7 C), resp. rate 15, weight 65.7 kg, SpO2 98 %.  Medical Problem List and Plan: 1. Functional deficits secondary to   Right MCA infarct s/p TNK and IR with TICI2c, R ICA stent with left hemiplegia             -patient may  shower             -ELOS/Goals: 12-14 days             -Continue CIR             -PRAFO ordered             -Continue amantadine '200mg'$  BID for arousal 2.  Antithrombotics: -DVT/anticoagulation:  Pharmaceutical: Lovenox             -antiplatelet therapy: ASA/Brilinta.  3. Headache: scheduled tylenol '650mg'$  TID. Recommended ice for headache --Will add aquatherapy for local measures.  4. Mood/Behavior/Sleep: LCSW to follow for evaluation and support.              -antipsychotic agents: N/A 5. Neuropsych/cognition: This patient may not be fully capable of making decisions on her own behalf. 6. Skin/Wound Care: Routine pressure relief measures 7. Fluids/Electrolytes/Nutrition: Monitor I/O. Poor intake Check BMET in am.  8. CAS s/p stent: On ASA/Brilinta 9. HTN: Monitor BP TID--BP fluctuates, continue Losartan '25mg'$ . 10/15: BP up to 165/66: add amlodipine '5mg'$  Vitals:   02/20/22 2005 02/21/22 0412  BP: (!) 143/89 (!) 165/66  Pulse: 72 66  Resp: 18 15  Temp: 98 F (36.7 C) 98 F (36.7 C)  SpO2: 98% 98%    10. Leucocytosis/UTI: Treated with rocephin. 11. AKI: Improved with IVF on board-->d/c and encourage fluid intake.     Latest Ref Rng & Units 02/17/2022    8:00 AM 02/16/2022    4:00 AM 02/13/2022    4:00 AM  BMP  Glucose 70 - 99 mg/dL 109  114  111   BUN 8 - 23 mg/dL '24  25  23   '$ Creatinine 0.44 - 1.00 mg/dL 1.09  1.00  1.06   Sodium 135 - 145 mmol/L 138  135  137   Potassium 3.5 - 5.1 mmol/L 3.7  3.7  3.9   Chloride 98 - 111 mmol/L 110  108  112   CO2 22 - 32 mmol/L '19  17  17   '$ Calcium 8.9 - 10.3 mg/dL 9.0  9.0  8.6   If BUN climbs  above 30 will restart nocturanl IVF  12. Dysphagia: Continue D3, honey thick liquids. Will d/c  IVF and monitor hydration status.             --encourage water protocol 13. Urinary retention will cont flomax, I/O caths, UC + enterococcus, f/u sensitivities, discussed with pharmacy, 1 dose fosfomycin recommended 14. Headache             -Continue Topamax and Fioricet prn 15. HLD             -Continue crestor 16. COPD             -Albuterol PRN 17. Hypothyroidism             -Continue synthroid 18. Low back pain: encouraged use of heating pad.     LOS: 5 days A FACE TO FACE EVALUATION WAS PERFORMED  Julia Deleon 02/21/2022, 9:52 AM

## 2022-02-22 LAB — BASIC METABOLIC PANEL
Anion gap: 11 (ref 5–15)
BUN: 20 mg/dL (ref 8–23)
CO2: 19 mmol/L — ABNORMAL LOW (ref 22–32)
Calcium: 9.7 mg/dL (ref 8.9–10.3)
Chloride: 107 mmol/L (ref 98–111)
Creatinine, Ser: 1.05 mg/dL — ABNORMAL HIGH (ref 0.44–1.00)
GFR, Estimated: 56 mL/min — ABNORMAL LOW (ref >60)
Glucose, Bld: 107 mg/dL — ABNORMAL HIGH (ref 70–99)
Potassium: 3.5 mmol/L (ref 3.5–5.1)
Sodium: 137 mmol/L (ref 135–145)

## 2022-02-22 LAB — CBC
HCT: 34.9 % — ABNORMAL LOW (ref 36.0–46.0)
Hemoglobin: 11.8 g/dL — ABNORMAL LOW (ref 12.0–15.0)
MCH: 32.2 pg (ref 26.0–34.0)
MCHC: 33.8 g/dL (ref 30.0–36.0)
MCV: 95.1 fL (ref 80.0–100.0)
Platelets: 225 10*3/uL (ref 150–400)
RBC: 3.67 MIL/uL — ABNORMAL LOW (ref 3.87–5.11)
RDW: 14.9 % (ref 11.5–15.5)
WBC: 7 10*3/uL (ref 4.0–10.5)
nRBC: 0 % (ref 0.0–0.2)

## 2022-02-22 LAB — GLUCOSE, CAPILLARY
Glucose-Capillary: 125 mg/dL — ABNORMAL HIGH (ref 70–99)
Glucose-Capillary: 97 mg/dL (ref 70–99)
Glucose-Capillary: 96 mg/dL (ref 70–99)
Glucose-Capillary: 112 mg/dL — ABNORMAL HIGH (ref 70–99)

## 2022-02-22 LAB — SARS CORONAVIRUS 2 BY RT PCR: SARS Coronavirus 2 by RT PCR: NEGATIVE

## 2022-02-22 MED ORDER — ACETAMINOPHEN 325 MG PO TABS
650.0000 mg | ORAL_TABLET | Freq: Three times a day (TID) | ORAL | Status: DC
  Administered 2022-02-22 – 2022-03-04 (×43): 650 mg via ORAL
  Filled 2022-02-22 (×43): qty 2

## 2022-02-22 MED ORDER — AZELASTINE HCL 0.1 % NA SOLN
1.0000 | Freq: Two times a day (BID) | NASAL | Status: DC
  Administered 2022-02-22 – 2022-03-04 (×15): 1 via NASAL
  Filled 2022-02-22: qty 30

## 2022-02-22 MED ORDER — LORATADINE 10 MG PO TABS
10.0000 mg | ORAL_TABLET | Freq: Every day | ORAL | Status: DC
  Administered 2022-02-22 – 2022-03-04 (×11): 10 mg via ORAL
  Filled 2022-02-22 (×11): qty 1

## 2022-02-22 NOTE — Progress Notes (Signed)
Infection Prevention  Received call from: Rebecca RN on 4W Regarding: Pt w/sore throat, myalgias, congestion, neg Covid today. IP Recommendation: Advised that due to symptoms patient should remain on Airborne/Contact precautions and retest for Covid in 48hrs. Following.

## 2022-02-22 NOTE — Progress Notes (Signed)
Physical Therapy Session Note  Patient Details  Name: Julia Deleon MRN: [id number] Date of Birth: [birth year]  Today's Date: 02/22/2022 PT Individual Time: 7342-8768 PT Individual Time Calculation (min): 45 min  and Today's Date: 02/22/2022 PT Missed Time: 30 Minutes Missed Time Reason: Patient fatigue  Short Term Goals: Week 1:  PT Short Term Goal 1 (Week 1): Pt will sit EOB with mod Assist up to 3 minteus PT Short Term Goal 2 (Week 1): Pt will initiate gait training PT Short Term Goal 3 (Week 1): Pt will trasnfer to [facility] with max assist of 1 PT Short Term Goal 4 (Week 1): Pt will perform sit<>stand with max assist of 1` PT Short Term Goal 5 (Week 1): Pt will perform FIST assessment Week 2:     Skilled Therapeutic Interventions/Progress Updates:  Donned contact and airborne PPE in accordance with safety banner on door. Suspected COVID. First test (-). Waiting on next test.   Patient seated upright in bed and completing lunch on entrance to room. Patient alert and agreeable to PT session. Provided supervision to complete lunch.  Patient with no pain complaint at start of session.  Therapeutic Activity: Bed Mobility: Pt performed supine --> sit with extra time to . VC/ tc required for ***. Transfers: Pt performed sit<>stand and stand pivot transfers throughout session with ***. Provided verbal cues for***.  Gait Training:  Pt ambulated *** ft using *** with ***. Demonstrated ***. Provided vc/ tc for ***.  Wheelchair Mobility:  Pt propelled wheelchair *** feet with ***. Provided vc for ***.  Neuromuscular Re-ed: NMR facilitated during session with focus on motor control, sitting balance, midline orientation. Pt guided in ***. NMR performed for improvements in motor control and coordination, balance, sequencing, judgement, and self confidence/ efficacy in performing all aspects of mobility at highest level of independence.   Patient supine in bed at end of session with brakes locked,  bed alarm set, and all needs within reach.   Therapy Documentation Precautions:  Precautions Precautions: Fall Precaution Comments: L hemiparesis, L inattention Restrictions Weight Bearing Restrictions: No General:   Vital Signs:   Pain:   Therapy/Group: Individual Therapy  Alger Simons PT, DPT, CSRS 02/22/2022, 1:17 PM

## 2022-02-22 NOTE — Progress Notes (Signed)
Occupational Therapy Session Note  Patient Details  Name: [name] MRN: [id number] Date of Birth: 07/03/1945  Today's Date: 02/22/2022 OT Individual Time: 1140-1210 OT Individual Time Calculation (min): 30 min    Short Term Goals: Week 1:  OT Short Term Goal 1 (Week 1): Pt maintain sitting balance during functional activity for 5 minutes with min A OT Short Term Goal 2 (Week 1): Pt will complete stand pivot bed to wc transfer with mod A OT Short Term Goal 3 (Week 1): Pt will integrate functional use of LUE during grooming tasks 25% of time with mod A OT Short Term Goal 4 (Week 1): Pt will attend to objects on her left side during ADLs with minimal assistance  Skilled Therapeutic Interventions/Progress Updates:    Pt received in bed and agreeable to therapy. +2 A from second OT.  Pt did not have pants on, so from bed pt worked on moving LLE with A to enable her to bridge and roll.  Rolled to R side with mod A and then sat up with max.  Initially leaning heavily to the L. Placed stacked pillows on her R and cued her to push elbow down into pillows. This did enable her to shift to her R side and achieve midline. Due to impaired attention, pt needed constant cues to push into pillows and keep head in midline.  Cues and A for upright posture.  Stood from EOB 2x with heavy max A. Cues to push up through LLE and to look up as pt tends to stand in hunched position. Pt returned to sit and continued to need max A to hold static sitting unless she followed cues to wt shift to her R side.  Pt requested to lay back down.  Max -total for achieving supine and getting adjusted in bed. Pt in bed with pillows supporting her arm.  Bed alarm set and all needs met.   Therapy Documentation Precautions:  Precautions Precautions: Fall Precaution Comments: L hemiparesis, L inattention Restrictions Weight Bearing Restrictions: No   Pain:  occasional c/o B sh pain    ADL: ADL Eating: Minimal assistance,  Moderate cueing Where Assessed-Eating: Wheelchair Grooming: Moderate assistance Where Assessed-Grooming: Wheelchair, Sitting at sink Upper Body Bathing: Maximal assistance, Maximal cueing Where Assessed-Upper Body Bathing: Sitting at sink, Wheelchair Lower Body Bathing: Maximal assistance Where Assessed-Lower Body Bathing: Wheelchair, Sitting at sink Upper Body Dressing: Maximal cueing, Maximal assistance Where Assessed-Upper Body Dressing: Edge of bed Lower Body Dressing: Maximal assistance Where Assessed-Lower Body Dressing: Edge of bed Toileting: Dependent Where Assessed-Toileting: Bed level Toilet Transfer: Not assessed Toilet Transfer Method: Not assessed      Therapy/Group: Individual Therapy  SAGUIER,JULIA 02/22/2022, 1:13 PM 

## 2022-02-22 NOTE — Progress Notes (Signed)
Occupational Therapy Session Note  Patient Details  Name: Julia Deleon MRN: [id number] Date of Birth: Mar 15, 1946  Today's Date: 02/22/2022 OT Individual Time: 0905-1000 OT Individual Time Calculation (min): 55 min    Short Term Goals: Week 1:  OT Short Term Goal 1 (Week 1): Pt maintain sitting balance during functional activity for 5 minutes with min A OT Short Term Goal 2 (Week 1): Pt will complete stand pivot bed to wc transfer with mod A OT Short Term Goal 3 (Week 1): Pt will integrate functional use of LUE during grooming tasks 25% of time with mod A OT Short Term Goal 4 (Week 1): Pt will attend to objects on her left side during ADLs with minimal assistance  Skilled Therapeutic Interventions/Progress Updates:    Patient received supine in bed.  Patient reporting headache pain 8/10.  Patient reporting overall fatigue.  Patient agreeable to getting washed up stating - "I have never done this before."  Patient needed max assist to roll toward right and transition to sitting.  Worked in unsupported sitting to establish static balance.  Patient pushes self to left and initially resists weight shift to right.  Worked on seated rest break on right forearm to promote right lateral trunk flexion/ left lat trunk extension.  Patient fearful of weight shift forward and resists initially.  Worked on multiple sit to stand transitions to change brief and perform pericare with second helper.  Worked on squat pivot transfer into wheelchair to bathe bad dress at sink.  Set up wheelchair and task to promote rightward weight shifts.  Patient apraxic and with limited sustained attention.  Patient engaged cognitively in task for up to 30 seconds.  Patient with limited frustration tolerance.  Patient assisted back to bed for direct hand of to nurse staff for care at end of session.    Therapy Documentation Precautions:  Precautions Precautions: Fall Precaution Comments: L hemiparesis, L  inattention Restrictions Weight Bearing Restrictions: No  Pain: Pain Assessment Pain Scale: 0-10 Pain Score: 8 Pain Type: Acute pain Pain Location: Head Pain Orientation: Right Pain Descriptors / Indicators: Headache Pain Frequency: Intermittent Pain Onset: On-going Pain Intervention(s): Medication (See eMAR)  Therapy/Group: Individual Therapy  Mariah Milling 02/22/2022, 12:24 PM

## 2022-02-22 NOTE — Progress Notes (Addendum)
PROGRESS NOTE   Subjective/Complaints: UC reviewed and shows >100,000 enterococcus fecalis + sore throat no cough or SOB, aching "all over"  ROS- neg CP, SOB, N/V/D, +headache, +shoulder pain  Objective:   No results found. Recent Labs    02/22/22 0550  WBC 7.0  HGB 11.8*  HCT 34.9*  PLT 225   Recent Labs    02/22/22 0550  NA 137  K 3.5  CL 107  CO2 19*  GLUCOSE 107*  BUN 20  CREATININE 1.05*  CALCIUM 9.7    Intake/Output Summary (Last 24 hours) at 02/22/2022 2841 Last data filed at 02/22/2022 0214 Gross per 24 hour  Intake 100 ml  Output 1100 ml  Net -1000 ml         Physical Exam: Vital Signs Blood pressure 136/65, pulse 66, temperature 97.6 F (36.4 C), resp. rate 16, weight 67.3 kg, SpO2 98 %.  General: No acute distress Mood and affect are appropriate Heart: Regular rate and rhythm no rubs murmurs or extra sounds Lungs: Clear to auscultation, breathing unlabored, no rales or wheezes Abdomen: Positive bowel sounds, soft nontender to palpation, nondistended Extremities: No clubbing, cyanosis, or edema Skin: No evidence of breakdown, no evidence of rash   Neurologic: Cranial nerves II through XII intact, motor strength is 5/5 in right and 0/5 deltoid, 2- horz add, trace bicep, tricep, grip, 0/5 hip flexor, knee extensors, ankle dorsiflexor and plantar flexor Trace L hip/knee ext synergy   Musculoskeletal: back pain with sitting forward    Assessment/Plan: 1. Functional deficits which require 3+ hours per day of interdisciplinary therapy in a comprehensive inpatient rehab setting. Physiatrist is providing close team supervision and 24 hour management of active medical problems listed below. Physiatrist and rehab team continue to assess barriers to discharge/monitor patient progress toward functional and medical goals  Care Tool:  Bathing    Body parts bathed by patient: Left arm, Chest,  Abdomen, Face   Body parts bathed by helper: Front perineal area, Right lower leg, Buttocks, Right upper leg, Left upper leg, Left lower leg     Bathing assist Assist Level: Total Assistance - Patient < 25%     Upper Body Dressing/Undressing Upper body dressing   What is the patient wearing?: Pull over shirt    Upper body assist Assist Level: Total Assistance - Patient < 25%    Lower Body Dressing/Undressing Lower body dressing      What is the patient wearing?: Pants, Underwear/pull up     Lower body assist Assist for lower body dressing: Total Assistance - Patient < 25%     Toileting Toileting    Toileting assist Assist for toileting: 2 Helpers     Transfers Chair/bed transfer  Transfers assist  Chair/bed transfer activity did not occur: Safety/medical concerns  Chair/bed transfer assist level: Total Assistance - Patient < 25%     Locomotion Ambulation   Ambulation assist   Ambulation activity did not occur: Safety/medical concerns      Max distance: Tilt in space wc   Walk 10 feet activity   Assist  Walk 10 feet activity did not occur: Safety/medical concerns        Walk  50 feet activity   Assist Walk 50 feet with 2 turns activity did not occur: Safety/medical concerns         Walk 150 feet activity   Assist Walk 150 feet activity did not occur: Safety/medical concerns         Walk 10 feet on uneven surface  activity   Assist Walk 10 feet on uneven surfaces activity did not occur: Safety/medical concerns         Wheelchair     Assist Is the patient using a wheelchair?: Yes Type of Wheelchair: Manual    Wheelchair assist level: Dependent - Patient 0% Max wheelchair distance: 172f in TIS    Wheelchair 50 feet with 2 turns activity    Assist        Assist Level: Dependent - Patient 0%   Wheelchair 150 feet activity     Assist      Assist Level: Dependent - Patient 0%   Blood pressure 136/65,  pulse 66, temperature 97.6 F (36.4 C), resp. rate 16, weight 67.3 kg, SpO2 98 %.  Medical Problem List and Plan: 1. Functional deficits secondary to   Right MCA infarct s/p TNK and IR with TICI2c, R ICA stent with left hemiplegia             -patient may  shower             -ELOS/Goals: 12-14 days             -Continue CIR             -PRAFO ordered             -Continue amantadine '200mg'$  BID for arousal 2.  Antithrombotics: -DVT/anticoagulation:  Pharmaceutical: Lovenox             -antiplatelet therapy: ASA/Brilinta.  3. Headache: scheduled tylenol '650mg'$  TID. Recommended ice for headache --Will add aquatherapy for local measures.  4. Mood/Behavior/Sleep: LCSW to follow for evaluation and support.              -antipsychotic agents: N/A 5. Neuropsych/cognition: This patient may not be fully capable of making decisions on her own behalf. 6. Skin/Wound Care: Routine pressure relief measures 7. Fluids/Electrolytes/Nutrition: Monitor I/O. Poor intake Check BMET in am.  8. CAS s/p stent: On ASA/Brilinta 9. HTN: Monitor BP TID--BP fluctuates, continue Losartan '25mg'$ . 10/16: BP up to 189/52 cont amlodipine '5mg'$  Vitals:   02/22/22 0344 02/22/22 0614  BP: (!) 189/52 136/65  Pulse: 68 66  Resp: 16   Temp: 97.6 F (36.4 C)   SpO2: 98%   BP still labile monitor prior to adjusting BP meds  10. Leucocytosis/UTI: Treated with rocephin. 11. AKI: Improved with IVF on board-->d/c and encourage fluid intake.     Latest Ref Rng & Units 02/22/2022    5:50 AM 02/17/2022    8:00 AM 02/16/2022    4:00 AM  BMP  Glucose 70 - 99 mg/dL 107  109  114   BUN 8 - 23 mg/dL '20  24  25   '$ Creatinine 0.44 - 1.00 mg/dL 1.05  1.09  1.00   Sodium 135 - 145 mmol/L 137  138  135   Potassium 3.5 - 5.1 mmol/L 3.5  3.7  3.7   Chloride 98 - 111 mmol/L 107  110  108   CO2 22 - 32 mmol/L '19  19  17   '$ Calcium 8.9 - 10.3 mg/dL 9.7  9.0  9.0   If BUN climbs above  30 will restart nocturanl IVF  12. Dysphagia: Continue  D3, honey thick liquids. Will d/c IVF and monitor hydration status.             --encourage water protocol 13. Urinary retention will cont flomax, I/O caths, UC + enterococcus, f/u sensitivities, discussed with pharmacy, 1 dose fosfomycin recommended received on 10/15 14. Headache             -Continue Topamax and Fioricet prn 15. HLD             -Continue crestor 16. COPD             -Albuterol PRN 17. Hypothyroidism             -Continue synthroid 18. Low back pain: encouraged use of heating pad.   19. Sore throat and myalgias- no fever or respiratory sx, check Covid 19 PCR  LOS: 6 days A FACE TO FACE EVALUATION WAS PERFORMED  Charlett Blake 02/22/2022, 8:12 AM

## 2022-02-22 NOTE — Progress Notes (Addendum)
Speech Language Pathology Daily Session Note  Patient Details  Name: Julia Deleon MRN: [id number] Date of Birth: 1946-03-08  Today's Date: 02/22/2022 SLP Individual Time: 0803-0900 SLP Individual Time Calculation (min): 57 min  Short Term Goals: Week 1: SLP Short Term Goal 1 (Week 1): Pt will participate in controlled trials of ice chips and thin water with minimal s/sx concerning for airway compromise to prepare for repeat instrumental swallow study. SLP Short Term Goal 2 (Week 1): Pt will sustain attention to therapeutic tasks for 10 minutes with Min A for redirection. SLP Short Term Goal 3 (Week 1): Pt will verbalize 3 physical and/or cognitive deficits s/p CVA given Max A. SLP Short Term Goal 4 (Week 1): Pt will recall 3 pieces of functional information by utilizing external aids with Max A. SLP Short Term Goal 5 (Week 1): Pt will attend to L visual field during table top tasks given Max A for use of compensatory visual attention strategies. SLP Short Term Goal 6 (Week 1): Pt will participate in basic problem-solving tasks given Max A.  Skilled Therapeutic Interventions: Skilled ST treatment focused on cognitive and swallowing goals.Pt greeted upright in bed and consuming morning meal on arrival. Pt reported sleeping poorly last night due to discomfort. Pain medication administered by nurse prior to SLP session. Pt consumed dysphagia 3 textures and nectar thick liquids without overt s/sx of aspiration. Pt was independent with consistently wiping mouth between bites, with minimal oral residuals, and without evidence of pocketing. Continue current diet. Pt sustained attention to meal for 15 minute duration with min A verbal redirection cues. Pt alternated her attention between meal and functional discussion with SLP regarding previous therapy events and current deficits with min-to-mod A verbal redirection cues to attend to conversation. Pt required max A verbal, tactile, and contextual cues to  bring awareness to physical deficits. With significant verbal support, pt identified physical weakness, however incomplete awareness to severity of impairments, as pt stated "I think my left side is a little weaker than my right." Pt exhibited no awareness of left visual inattention and made little-to-no eye contact with SLP during session with SLP sitting on pt's left unless max A cues were given. SLP facilitated left visual scanning task (letter cancellation task) in which pt achieved 14% accuracy with most errors occurring in the left visual field, although several errors were made midline and right visual field as well. Pt required extensive cueing to correct, and for persistence to task with attention fading to ~1 minute intervals likely secondary to fatigue.  Pt completed oral care with suction toothbrush following set-up A. Pt consumed single ice chips without overt s/sx of aspiration. Pt consumed tsp and small cup sips of thin water by cup with immediate throat clear during 50% of occasions. Continue current diet and trials of thin liquids at bedside with SLP. Of note, pt reported sore throat and head congestion to MD during morning rounds. Due to this report, it appears pt will be tested for Covid. Would need to defer any consideration of possible MBS until contact precautions are lifted, and pending covid results. Will continue to monitor readiness for repeat instrumental assessment. Patient was left in bed with alarm activated and immediate needs within reach at end of session. Continue per current plan of care.      Pain  Generalized discomfort. Meds administered by nurse prior to session. SLP facilitated repositioning in bed throughout session with minimal improvement.  Therapy/Group: Individual Therapy  Julia Deleon T Julia Deleon 02/22/2022, 8:09  AM

## 2022-02-23 LAB — GLUCOSE, CAPILLARY
Glucose-Capillary: 102 mg/dL — ABNORMAL HIGH (ref 70–99)
Glucose-Capillary: 105 mg/dL — ABNORMAL HIGH (ref 70–99)
Glucose-Capillary: 108 mg/dL — ABNORMAL HIGH (ref 70–99)

## 2022-02-23 NOTE — Progress Notes (Signed)
Physical Therapy Session Note  Patient Details  Name: Julia Deleon MRN: [id number] Date of Birth: 18-Apr-1946  Today's Date: 02/23/2022 PT Individual Time: 8657-8469 PT Individual Time Calculation (min): 55 min   Short Term Goals: Week 1:  PT Short Term Goal 1 (Week 1): Pt will sit EOB with mod Assist up to 3 minteus PT Short Term Goal 2 (Week 1): Pt will initiate gait training PT Short Term Goal 3 (Week 1): Pt will trasnfer to [facility] with max assist of 1 PT Short Term Goal 4 (Week 1): Pt will perform sit<>stand with max assist of 1` PT Short Term Goal 5 (Week 1): Pt will perform FIST assessment  Skilled Therapeutic Interventions/Progress Updates:   Therapist donned appropriate PPE for airborne precautions - pt still awaiting second COVID test results.  Pt received supine in bed with nurse present performing cath. Pt agreeable to therapy session therefore therapist assumed care of pt after completion of nursing care.   Pt noted to be incontinent of BM. Rolling R/L in bed using R UE support on bedrail with light mod assist for L hemibody management and rotating pelvis during dependent assist peri-care and brief management. While in supine, donned pants with max/total assist and pt able to lift L LE up off the bed with min assist to thread on pants. Donned tennis shoes total assist - pt again able to lift L LE against gravity to assist with donning.  Supine>sitting R EOB, HOB slightly elevated and using bedrail, via logroll technique to increase independence with heavy mod assist of 1 for L hemibody management and trunk upright - step-by-step cuing provided for sequencing. Initially upon sitting upright, pt requires min/mod assist for balance with slight pushing back and to L, but improves to CGA/light min assist - pt does have consistent posterior lean sitting EOB but no true LOB.   Pt demos consistent R gaze preference with L inattention throughout session. Donned L LE wrist cock-up splint to be  worn during the day.  L squat pivot transfer EOB>TIS w/c with max assist of 1 for lifting/pivoting hips - cuing for increased anterior trunk lean and head/hips relationship - therapist blocking L knee to promote WBing and cuing for increased B LE activation to lift hips to complete transfer.   Sitting in TIS w/c at sink pt washed her face with set-up assist while therapist removed the Kinesiotape at her L upper trap because it was pealing off.  Pt positioned with bedrail on her R side and performed sit>stand x2 from TIS w/c to R UE support on bedrail with heavy mod assist of 1 for lifting hips while blocking L knee to promote WBing - pt able to achieve knee extension with partial hip extension but is unable to perform complete hip and trunk extension to achieve an upright posture (pt reports chronic LBP). Pt tolerates standing <15seconds on each bout. Does report feeling lightheaded after 1st stand therefore vitals assessed: BP 129/71 (MAP 88), HR 76bpm.  At end of session, pt left seated tilted back in TIS w/c with needs in reach, L UE supported on padded 1/2 lap tray, and seat belt alarm on.  Therapy Documentation Precautions:  Precautions Precautions: Fall Precaution Comments: L hemiparesis, L inattention Restrictions Weight Bearing Restrictions: No   Pain:  Reports chronic LBP, bilateral shoulder pain, and generalized pain - provided distraction, emotional support, and repositioning for pain management during session with pt able to participate without pain limiting.   Therapy/Group: Individual Therapy  Atwood Adcock  Francis Dowse , PT, DPT, NCS, CSRS 02/23/2022, 8:05 AM

## 2022-02-23 NOTE — Progress Notes (Signed)
Speech Language Pathology Daily Session Note  Patient Details  Name: Julia Deleon MRN: [id number] Date of Birth: [birth year]  Today's Date: 02/23/2022 SLP Individual Time: 0730-0750 SLP Individual Time Calculation (min): 20 min and Today's Date: 02/23/2022 SLP Missed Time: 40 Minutes Missed Time Reason: Pain  Short Term Goals: Week 1: SLP Short Term Goal 1 (Week 1): Pt will participate in controlled trials of ice chips and thin water with minimal s/sx concerning for airway compromise to prepare for repeat instrumental swallow study. SLP Short Term Goal 2 (Week 1): Pt will sustain attention to therapeutic tasks for 10 minutes with Min A for redirection. SLP Short Term Goal 3 (Week 1): Pt will verbalize 3 physical and/or cognitive deficits s/p CVA given Max A. SLP Short Term Goal 4 (Week 1): Pt will recall 3 pieces of functional information by utilizing external aids with Max A. SLP Short Term Goal 5 (Week 1): Pt will attend to L visual field during table top tasks given Max A for use of compensatory visual attention strategies. SLP Short Term Goal 6 (Week 1): Pt will participate in basic problem-solving tasks given Max A.  Skilled Therapeutic Interventions: Skilled ST treatment focused on cognitive goals. Pt greeted in bed on arrival and complained of a "splitting" headache, ongoing throat pain, and R shoulder pain. Pt required consistent verbal redirection cues due to distractibility to pain and reduced attention span. SLP provided education and reinforcement on use of call bell. Pt required min A to navigate secondary to visual deficits. Pt was able to locate red "nurse" button however does not appear pt has been utilizing to inform staff of her needs. Will continue efforts for improved carry over. Pt eventually requested to conclude session 40 minutes early due to increasing headache and shoulder discomfort. Nurse was notified. Patient was left in bed with alarm activated and immediate needs within  reach at end of session. Continue per current plan of care.      Pain Pain Assessment Pain Scale: 0-10 Pain Score: 8  Pain Type: Acute pain Pain Location: Head Pain Descriptors / Indicators: Headache Pain Frequency: Intermittent  Therapy/Group: Individual Therapy  Patty Sermons 02/23/2022, 12:51 PM

## 2022-02-23 NOTE — Progress Notes (Incomplete)
Physical Therapy Session Note  Patient Details  Name: Julia Deleon MRN: [id number] Date of Birth: 03/18/1946  Today's Date: 02/23/2022 PT Individual Time: 1115-1200 PT Individual Time Calculation (min): 45 min   Short Term Goals: Week 1:  PT Short Term Goal 1 (Week 1): Pt will sit EOB with mod Assist up to 3 minteus PT Short Term Goal 2 (Week 1): Pt will initiate gait training PT Short Term Goal 3 (Week 1): Pt will trasnfer to [facility] with max assist of 1 PT Short Term Goal 4 (Week 1): Pt will perform sit<>stand with max assist of 1` PT Short Term Goal 5 (Week 1): Pt will perform FIST assessment  Skilled Therapeutic Interventions/Progress Updates:  Patient supine in bed and asleep on entrance to room. Dtr present. During discussion with dtr re: pt's progress, pt aroused and participating in coversation. Patient then alert and agreeable to PT session. +2 available for assist in sitting and standing balance.   Patient with soreness complaint at start of session. Relates k-pad application for increased soreness in back rather than shoulders/ neck.   Therapeutic Activity: Bed Mobility: Pt performed supine <> sit with ***. VC/ tc required for ***. Transfers: Pt performed sit<>stand and stand pivot transfers throughout session with ***. Provided verbal cues for***.  Neuromuscular Re-ed: NMR facilitated during session with focus on sitting balance and standing balance/ tolerance. Pt guided in sitting balance with focus on pull rather than push and maintaining midline with continuous cues as needed for increasing lean to R side. Pt also guided in sit<>stand at EOB to 2 person HHA. Pt requires ModA +2 to rise to stand and MinA +2 for descent to sit. Increased motor control noted in descent to sit. While standing, pt requires guard to L knee and facilitation at L anterior shoulder as well as L posterior hip for improving upright stance with increased muscle activation. More activation noted during LE AAROM  . NMR performed for improvements in motor control and coordination, balance, sequencing, judgement, and self confidence/ efficacy in performing all aspects of mobility at highest level of independence.   Patient supine at end of session with brakes locked, bed alarm set, and all needs within reach. Dtr in room. Bed positioned in more upright position for upcoming lunch arrival.    Therapy Documentation Precautions:  Precautions Precautions: Fall Precaution Comments: L hemiparesis, L inattention Restrictions Weight Bearing Restrictions: No General:   Vital Signs: Therapy Vitals Temp: 97.8 F (36.6 C) Pulse Rate: 66 Resp: 17 BP: 125/72 Patient Position (if appropriate): Lying Oxygen Therapy SpO2: 99 % O2 Device: Room Air Pain:  Soreness increasing in mid back and neck during session. Addressed with application of k-pad to areas.    Therapy/Group: Individual Therapy  Alger Simons PT, DPT, CSRS 02/23/2022, 4:51 PM

## 2022-02-23 NOTE — Progress Notes (Signed)
Occupational Therapy Session Note  Patient Details  Name: Julia Deleon MRN: [id number] Date of Birth: 05-30-1945  Today's Date: 02/23/2022 OT Individual Time: 6270-3500 OT Individual Time Calculation (min): 44 min    Short Term Goals: Week 1:  OT Short Term Goal 1 (Week 1): Pt maintain sitting balance during functional activity for 5 minutes with min A OT Short Term Goal 2 (Week 1): Pt will complete stand pivot bed to wc transfer with mod A OT Short Term Goal 3 (Week 1): Pt will integrate functional use of LUE during grooming tasks 25% of time with mod A OT Short Term Goal 4 (Week 1): Pt will attend to objects on her left side during ADLs with minimal assistance  Skilled Therapeutic Interventions/Progress Updates:  Pt greeted supine in bed with daughter present, pt reports fatigue but agreeable to OT intervention. Pt completed supine>sit with MOD A. Worked on static sitting balance with pt needing up to MAX A for sitting balance. Pt does well with pillows placed on R side with verbal cues to place R elbow on pillows, however pt only able to maintain this for a couple minutes before leaning back to L. Pt also pushes to L with RUE when not cued. Attempted several compensatory techniques for sitting balance including placing R hand on knee with plam supinated with little improvement.  Worked on lateral leans to L side with pt able to push through LUE back to midline with MODA, pt completed x5 reps for NMR. Wrist brace was donned for joint protection during task. Also worked on dynamic reaching with RUE to challenge dynamic sitting balance with an emphasis on returning back to midline. Pt with decreased ability to return to midline even with visual cues provided.  Pt returned back to supine with MOD A, with pt needing assist to elevate BLEs but able to cross RLE under LLE for compensatory method. Pt fatigued therefore remainder of session worked on PROM to LUE at wrist and elbow, noted active movement  in L biceps with elbow extension, no tone noted.  Pt left supine in bed with bed alarm activated, LUE resting on pillow and all needs within reach.   Therapy Documentation Precautions:  Precautions Precautions: Fall Precaution Comments: L hemiparesis, L inattention Restrictions Weight Bearing Restrictions: No  Pain: unrated pain reported in L shoulder and L knee, voltaren gel and rest breaks provided as needed.     Therapy/Group: Individual Therapy  [name] 02/23/2022, 3:46 PM

## 2022-02-23 NOTE — Progress Notes (Addendum)
PROGRESS NOTE   Subjective/Complaints:  Covid PCR neg on 10/16 Multiple aches and pains, HA, Neck pain , CLBP, sore throat ear pain  ROS- neg CP, SOB, N/V/D, +headache, +shoulder pain  Objective:   No results found. Recent Labs    02/22/22 0550  WBC 7.0  HGB 11.8*  HCT 34.9*  PLT 225    Recent Labs    02/22/22 0550  NA 137  K 3.5  CL 107  CO2 19*  GLUCOSE 107*  BUN 20  CREATININE 1.05*  CALCIUM 9.7     Intake/Output Summary (Last 24 hours) at 02/23/2022 0754 Last data filed at 02/23/2022 0738 Gross per 24 hour  Intake 338 ml  Output 1050 ml  Net -712 ml         Physical Exam: Vital Signs Blood pressure 139/70, pulse 69, temperature 97.7 F (36.5 C), temperature source Oral, resp. rate 14, weight 67.3 kg, SpO2 99 %. HEENT- AT, [place], no skin lesions on ear , no sinus tenderness General: No acute distress Mood and affect are appropriate Heart: Regular rate and rhythm no rubs murmurs or extra sounds Lungs: Clear to auscultation, breathing unlabored, no rales or wheezes Abdomen: Positive bowel sounds, soft nontender to palpation, nondistended Extremities: No clubbing, cyanosis, or edema Skin: No evidence of breakdown, no evidence of rash   Neurologic: Cranial nerves II through XII intact, motor strength is 5/5 in right and 0/5 deltoid, 2- horz add, 2- bicep, tricep, grip, 2-/5 hip flexor, knee extensors, 0/5 ankle dorsiflexor and plantar flexor   Musculoskeletal: mild pain with head rotation     Assessment/Plan: 1. Functional deficits which require 3+ hours per day of interdisciplinary therapy in a comprehensive inpatient rehab setting. Physiatrist is providing close team supervision and 24 hour management of active medical problems listed below. Physiatrist and rehab team continue to assess barriers to discharge/monitor patient progress toward functional and medical goals  Care  Tool:  Bathing    Body parts bathed by patient: Left arm, Chest, Abdomen, Face, Left upper leg, Right upper leg   Body parts bathed by helper: Right arm, Front perineal area, Buttocks     Bathing assist Assist Level: Maximal Assistance - Patient 24 - 49%     Upper Body Dressing/Undressing Upper body dressing   What is the patient wearing?: Pull over shirt    Upper body assist Assist Level: Total Assistance - Patient < 25%    Lower Body Dressing/Undressing Lower body dressing      What is the patient wearing?: Pants, Incontinence brief     Lower body assist Assist for lower body dressing: 2 Helpers     Toileting Toileting    Toileting assist Assist for toileting: 2 Helpers     Transfers Chair/bed transfer  Transfers assist  Chair/bed transfer activity did not occur: Safety/medical concerns  Chair/bed transfer assist level: Maximal Assistance - Patient 25 - 49%     Locomotion Ambulation   Ambulation assist   Ambulation activity did not occur: Safety/medical concerns      Max distance: Tilt in space wc   Walk 10 feet activity   Assist  Walk 10 feet activity did not occur: Safety/medical  concerns        Walk 50 feet activity   Assist Walk 50 feet with 2 turns activity did not occur: Safety/medical concerns         Walk 150 feet activity   Assist Walk 150 feet activity did not occur: Safety/medical concerns         Walk 10 feet on uneven surface  activity   Assist Walk 10 feet on uneven surfaces activity did not occur: Safety/medical concerns         Wheelchair     Assist Is the patient using a wheelchair?: Yes Type of Wheelchair: Manual    Wheelchair assist level: Dependent - Patient 0% Max wheelchair distance: 175f in TIS    Wheelchair 50 feet with 2 turns activity    Assist        Assist Level: Dependent - Patient 0%   Wheelchair 150 feet activity     Assist      Assist Level: Dependent -  Patient 0%   Blood pressure 139/70, pulse 69, temperature 97.7 F (36.5 C), temperature source Oral, resp. rate 14, weight 67.3 kg, SpO2 99 %.  Medical Problem List and Plan: 1. Functional deficits secondary to   Right MCA infarct s/p TNK and IR with TICI2c, R ICA stent with left hemiplegia             -patient may  shower             -ELOS/Goals: 12-14 days             -Continue CIR             -PRAFO ordered            dc amantadine, no issues with arousal , may be causing side  effects  2.  Antithrombotics: -DVT/anticoagulation:  Pharmaceutical: Lovenox             -antiplatelet therapy: ASA/Brilinta.  3. Headache: scheduled tylenol '650mg'$  QID. Also on topirimate '100mg'$  BID- pt states she's had this since CVA  --Will add aquatherapy for local measures.  4. Mood/Behavior/Sleep: LCSW to follow for evaluation and support.              -antipsychotic agents: N/A 5. Neuropsych/cognition: This patient may not be fully capable of making decisions on her own behalf. 6. Skin/Wound Care: Routine pressure relief measures 7. Fluids/Electrolytes/Nutrition: Monitor I/O. Poor intake Check BMET in am.  8. CAS s/p stent: On ASA/Brilinta 9. HTN: Monitor BP TID--BP fluctuates, continue Losartan '25mg'$ . 10/16: BP up to 189/52 cont amlodipine '5mg'$  Vitals:   02/22/22 1950 02/23/22 0603  BP: (!) 144/67 139/70  Pulse: 67 69  Resp: 18 14  Temp: 97.6 F (36.4 C) 97.7 F (36.5 C)  SpO2: 97% 99%  BP improved 10/17  10. Leucocytosis/UTI: Treated with rocephin. 11. AKI: Improved with IVF on board-->d/c and encourage fluid intake.     Latest Ref Rng & Units 02/22/2022    5:50 AM 02/17/2022    8:00 AM 02/16/2022    4:00 AM  BMP  Glucose 70 - 99 mg/dL 107  109  114   BUN 8 - 23 mg/dL '20  24  25   '$ Creatinine 0.44 - 1.00 mg/dL 1.05  1.09  1.00   Sodium 135 - 145 mmol/L 137  138  135   Potassium 3.5 - 5.1 mmol/L 3.5  3.7  3.7   Chloride 98 - 111 mmol/L 107  110  108   CO2 22 -  32 mmol/L '19  19  17    '$ Calcium 8.9 - 10.3 mg/dL 9.7  9.0  9.0   If BUN climbs above 30 will restart nocturnal IVF  12. Dysphagia: Continue D3, honey thick liquids. Will d/c IVF and monitor hydration status.             --encourage water protocol 13. Urinary retention will cont flomax, I/O caths, UC + enterococcus, f/u sensitivities, discussed with pharmacy, 1 dose fosfomycin recommended received on 10/15 14. Headache             -Continue Topamax and Fioricet prn 15. HLD             -Continue crestor 16. COPD             -Albuterol PRN 17. Hypothyroidism             -Continue synthroid 18. Low back pain: encouraged use of heating pad.   19. Sore throat and myalgias- no fever or respiratory sx, check Covid 19 PCR  LOS: 7 days A FACE TO FACE EVALUATION WAS PERFORMED  Charlett Blake 02/23/2022, 7:54 AM

## 2022-02-24 LAB — GLUCOSE, CAPILLARY
Glucose-Capillary: 101 mg/dL — ABNORMAL HIGH (ref 70–99)
Glucose-Capillary: 111 mg/dL — ABNORMAL HIGH (ref 70–99)
Glucose-Capillary: 112 mg/dL — ABNORMAL HIGH (ref 70–99)
Glucose-Capillary: 110 mg/dL — ABNORMAL HIGH (ref 70–99)
Glucose-Capillary: 108 mg/dL — ABNORMAL HIGH (ref 70–99)

## 2022-02-24 LAB — SARS CORONAVIRUS 2 BY RT PCR: SARS Coronavirus 2 by RT PCR: NEGATIVE

## 2022-02-24 NOTE — Progress Notes (Signed)
Physical Therapy Session Note  Patient Details  Name: Julia Deleon MRN: [id number] Date of Birth: 07/11/1945  Today's Date: 02/24/2022 PT Individual Time: 0936-1000 PT Individual Time Calculation (min): 24 min   Short Term Goals: Week 1:  PT Short Term Goal 1 (Week 1): Pt will sit EOB with mod Assist up to 3 minteus PT Short Term Goal 2 (Week 1): Pt will initiate gait training PT Short Term Goal 3 (Week 1): Pt will trasnfer to [facility] with max assist of 1 PT Short Term Goal 4 (Week 1): Pt will perform sit<>stand with max assist of 1` PT Short Term Goal 5 (Week 1): Pt will perform FIST assessment   Skilled Therapeutic Interventions/Progress Updates:   Pt received sitting in WC and agreeable to PT. Sit<>stand from [facility] with RUE supported on bed rail. Sit<>stand with mod assist and max assist to block the LLE, support LUE to prevent subluxation. Pt able to stand 3 x 10-15sec. Pt then perform pre-gait stepping R and L x 3 each with rest break between bouts due to fatigue. Max cues for improved posture, awareness of midline, and assist to advance the LLE. Patient left sitting in [facility] with call bell in reach and all needs met.            Therapy Documentation Precautions:  Precautions Precautions: Fall Precaution Comments: L hemiparesis, L inattention Restrictions Weight Bearing Restrictions: No     Pain: Pain Assessment Pain Scale: 0-10 Pain Score: 7  Pain Type: Acute pain Pain Location: Back Pain Orientation: Right Pain Descriptors / Indicators: Aching Patients Stated Pain Goal: 0 Pain Intervention(s): Repositioned   Therapy/Group: Individual Therapy  Lorie Phenix 02/24/2022, 10:32 AM

## 2022-02-24 NOTE — Progress Notes (Signed)
Occupational Therapy Weekly Progress Note  Patient Details  Name: Julia Deleon MRN: [id number] Date of Birth: 10/11/1945  Beginning of progress report period: February 17, 2022 End of progress report period: February 24, 2022  Today's Date: 02/24/2022 OT Individual Time: 0830-0915 OT Individual Time Calculation (min): 45 min    Patient has met 1 of 4 short term goals.  Pt's progress has been very limited due to short attention span, focus on numerous areas of arthritic pain, severely impaired postural control with limited awareness and dense L hemi plegia.  Patient continues to demonstrate the following deficits: muscle weakness, decreased cardiorespiratoy endurance, abnormal tone, motor apraxia, and decreased coordination, decreased visual perceptual skills, decreased visual motor skills, and hemianopsia, decreased midline orientation and decreased attention to left, decreased attention, decreased awareness, decreased problem solving, decreased memory, and delayed processing, and decreased sitting balance, decreased standing balance, decreased postural control, and hemiplegia and therefore will continue to benefit from skilled OT intervention to enhance overall performance with BADL and Reduce care partner burden.  Patient not progressing toward long term goals.  See goal revision..  Plan of care revisions: ..  Problem: RH Dressing Goal: LTG Patient will perform upper body dressing (OT) Description: LTG Patient will perform upper body dressing with min assist. Flowsheets (Taken 02/24/2022 1245) LTG: Pt will perform upper body dressing with assistance level of: (LTG downgraded as pt will need increased A due to severe L hemiplegia with lateral lean and limited attention span.) Moderate Assistance - Patient 50 - 74% Note: LTG downgraded as pt will need increased A due to severe L hemiplegia with lateral lean and limited attention span. Goal: LTG Patient will perform lower body dressing w/assist  (OT) Description: LTG: Patient will perform lower body dressing with moderate assist AD PRN.  Flowsheets (Taken 02/24/2022 1245) LTG: Pt will perform lower body dressing with assistance level of: (LTG downgraded as pt will need increased A due to severe L hemiplegia with lateral lean and limited attention span.) Maximal Assistance - Patient 25 - 49% Note: LTG downgraded as pt will need increased A due to severe L hemiplegia with lateral lean and limited attention span.   Problem: RH Toileting Goal: LTG Patient will perform toileting task (3/3 steps) with assistance level (OT) Description: LTG: Patient will perform toileting task (3/3 steps) with minimal assistance. Flowsheets (Taken 02/24/2022 1245) LTG: Pt will perform toileting task (3/3 steps) with assistance level: (LTG downgraded as pt will need increased A due to severe L hemiplegia with lateral lean and limited attention span.) Total Assistance - Patient < 25% Note: LTG downgraded as pt will need increased A due to severe L hemiplegia with lateral lean and limited attention span.   Problem: RH Functional Use of Upper Extremity Goal: LTG Patient will use RT/LT upper extremity as a (OT) Description: LTG: Patient will use LUE as a gross assist without verbal cues during functional activity.  Flowsheets (Taken 02/24/2022 1245) LTG: Use of upper extremity in functional activities: RUE as a stabilizer LTG: Pt will use upper extremity in functional activity with assistance level of: (LTG downgraded as pt will need increased A due to severe L hemiplegia with lateral lean and limited attention span.) Moderate Assistance - Patient 50 - 74% Note: LTG downgraded as pt will need increased A due to severe L hemiplegia with lateral lean and limited attention span.   Problem: RH Toilet Transfers Goal: LTG Patient will perform toilet transfers w/assist (OT) Description: LTG: Patient will perform toilet transfers with min   assist DME PRN.  Flowsheets  (Taken 02/24/2022 1245) LTG: Pt will perform toilet transfers with assistance level of: (LTG downgraded as pt will need increased A due to severe L hemiplegia with lateral lean and limited attention span.) Maximal Assistance - Patient 25 - 49% Note: LTG downgraded as pt will need increased A due to severe L hemiplegia with lateral lean and limited attention span.   Problem: RH Tub/Shower Transfers Goal: LTG Patient will perform tub/shower transfers w/assist (OT) Description: LTG: Patient will perform tub/shower transfers with min assist with DME PRN  Flowsheets (Taken 02/24/2022 1245) LTG: Pt will perform tub/shower stall transfers with assistance level of: (LTG discontinued as shower transfers are not safe due to her severely impaired postural control.) -- Note: LTG discontinued as shower transfers are not safe due to her severely impaired postural control.   Problem: RH Memory Goal: LTG Patient will demonstrate ability for day to day recall/carry over during activities of daily living with assistance level (OT) Description: LTG:  Patient will demonstrate ability for day to day recall/carry over during activities of daily living with minimal assistance.  Flowsheets (Taken 02/24/2022 1245) LTG:  Patient will demonstrate ability for day to day recall/carry over during activities of daily living with assistance level (OT): Moderate Assistance - Patient 50 - 74%    OT Short Term Goals Week 1:  OT Short Term Goal 1 (Week 1): Pt maintain sitting balance during functional activity for 5 minutes with min A OT Short Term Goal 1 - Progress (Week 1): Not progressing OT Short Term Goal 2 (Week 1): Pt will complete stand pivot bed to wc transfer with mod A OT Short Term Goal 2 - Progress (Week 1): Not progressing OT Short Term Goal 3 (Week 1): Pt will integrate functional use of LUE during grooming tasks 25% of time with mod A OT Short Term Goal 3 - Progress (Week 1): Not progressing OT Short Term Goal  4 (Week 1): Pt will attend to objects on her left side during ADLs with minimal assistance OT Short Term Goal 4 - Progress (Week 1): Met Week 2:  OT Short Term Goal 1 (Week 2): Pt will be able to rise to stand with mod A and then lift up into full extension to enable caregiver to assist with clothing management. OT Short Term Goal 2 (Week 2): pt will be able to maintain sit balance on BSC for 5 minutes without leaning to her L. OT Short Term Goal 3 (Week 2): Pt will be able to bathe with mod A.  Skilled Therapeutic Interventions/Progress Updates:    Pt received in bed having just finished breakfast.  +2 A from rehab tech this session. Pt stated "I am so worried I am getting impacted. I havent had a BM"  With max A of 1 pt sat to EOB, used slide board with max A of 2 to her L side pt moved to drop arm BSC.  Max of 1 sit to stand with pt using R hand on bed rail for support. She had already had some BM in her brief.  Once sitting on BSC she had a large BM. (Documented in flow sheet).  Pt had a VERY difficult time maintaining balance on BSC, leaning to L and pt can correct by leaning to R and placing her elbow/forearm on BSC arm rest but pt would only hold that position for a few seconds at a time.  Had to do several sit to stands for adequate cleansing, donning   of new brief and pants.  Also had pt bathe from [facility]. Because her balance was so impaired, therapist bathed pt as pt had to concentrate on holding herself upright.   Slide board to wc to L with max of 2.  Pt has significant L shoulder instability. Asked her PT to kinesiotape her shoulder later today.  Pt resting in TIS w/c with w/c belt on and all needs met.    Therapy Documentation Precautions:  Precautions Precautions: Fall Precaution Comments: L hemiparesis, L inattention Restrictions Weight Bearing Restrictions: No       Pain: Pain Assessment Pain Scale: 0-10 Pain Score: 7  Pain Type: Acute pain Pain Location: Back Pain  Orientation: Right Pain Descriptors / Indicators: Aching Patients Stated Pain Goal: 0 Pain Intervention(s): Repositioned ADL: ADL Eating: Minimal assistance, Moderate cueing Where Assessed-Eating: Wheelchair Grooming: Moderate assistance Where Assessed-Grooming: Wheelchair, Sitting at sink Upper Body Bathing: Maximal assistance, Maximal cueing Where Assessed-Upper Body Bathing: Sitting at sink, Wheelchair Lower Body Bathing: Maximal assistance Where Assessed-Lower Body Bathing: Wheelchair, Sitting at sink Upper Body Dressing: Maximal cueing, Maximal assistance Where Assessed-Upper Body Dressing: Edge of bed Lower Body Dressing: Maximal assistance Where Assessed-Lower Body Dressing: Edge of bed Toileting: Dependent Where Assessed-Toileting: Bed level Toilet Transfer: Not assessed Toilet Transfer Method: Not assessed    Therapy/Group: Individual Therapy  [place] 02/24/2022, 1:07 PM

## 2022-02-24 NOTE — Progress Notes (Addendum)
PROGRESS NOTE   Subjective/Complaints:  Covid PCR neg on 10/16 Repeat swab today   OT states pt is max A  OT noted  shoulder sublux as well as a bump on  ant delt on L side   ROS- neg CP, SOB, N/V/D, +headache, +shoulder pain  Objective:   No results found. Recent Labs    02/22/22 0550  WBC 7.0  HGB 11.8*  HCT 34.9*  PLT 225    Recent Labs    02/22/22 0550  NA 137  K 3.5  CL 107  CO2 19*  GLUCOSE 107*  BUN 20  CREATININE 1.05*  CALCIUM 9.7     Intake/Output Summary (Last 24 hours) at 02/24/2022 0937 Last data filed at 02/24/2022 0845 Gross per 24 hour  Intake 308 ml  Output 1200 ml  Net -892 ml         Physical Exam: Vital Signs Blood pressure 121/74, pulse 71, temperature 97.6 F (36.4 C), temperature source Oral, resp. rate 17, weight 66.5 kg, SpO2 100 %. HEENT- AT, [place], no skin lesions on ear , no sinus tenderness General: No acute distress Mood and affect are appropriate Heart: Regular rate and rhythm no rubs murmurs or extra sounds Lungs: Clear to auscultation, breathing unlabored, no rales or wheezes Abdomen: Positive bowel sounds, soft nontender to palpation, nondistended Extremities: No clubbing, cyanosis, or edema Skin: No evidence of breakdown, no evidence of rash, ~1cm subcut cyst, not erythematous, non tender    Neurologic: Cranial nerves II through XII intact, motor strength is 5/5 in right and 0/5 deltoid, 2- horz add, 2- bicep, tricep, grip, 2-/5 hip flexor, knee extensors, 0/5 ankle dorsiflexor and plantar flexor   Musculoskeletal: mild pain with head rotation     Assessment/Plan: 1. Functional deficits which require 3+ hours per day of interdisciplinary therapy in a comprehensive inpatient rehab setting. Physiatrist is providing close team supervision and 24 hour management of active medical problems listed below. Physiatrist and rehab team continue to assess barriers to  discharge/monitor patient progress toward functional and medical goals  Care Tool:  Bathing    Body parts bathed by patient: Left arm, Chest, Abdomen, Face, Left upper leg, Right upper leg   Body parts bathed by helper: Right arm, Front perineal area, Buttocks     Bathing assist Assist Level: Maximal Assistance - Patient 24 - 49%     Upper Body Dressing/Undressing Upper body dressing   What is the patient wearing?: Pull over shirt    Upper body assist Assist Level: Total Assistance - Patient < 25%    Lower Body Dressing/Undressing Lower body dressing      What is the patient wearing?: Pants, Incontinence brief     Lower body assist Assist for lower body dressing: 2 Helpers     Toileting Toileting    Toileting assist Assist for toileting: 2 Helpers     Transfers Chair/bed transfer  Transfers assist  Chair/bed transfer activity did not occur: Safety/medical concerns  Chair/bed transfer assist level: Maximal Assistance - Patient 25 - 49%     Locomotion Ambulation   Ambulation assist   Ambulation activity did not occur: Safety/medical concerns  Max distance: Tilt in space wc   Walk 10 feet activity   Assist  Walk 10 feet activity did not occur: Safety/medical concerns        Walk 50 feet activity   Assist Walk 50 feet with 2 turns activity did not occur: Safety/medical concerns         Walk 150 feet activity   Assist Walk 150 feet activity did not occur: Safety/medical concerns         Walk 10 feet on uneven surface  activity   Assist Walk 10 feet on uneven surfaces activity did not occur: Safety/medical concerns         Wheelchair     Assist Is the patient using a wheelchair?: Yes Type of Wheelchair: Manual    Wheelchair assist level: Dependent - Patient 0% Max wheelchair distance: 144f in TIS    Wheelchair 50 feet with 2 turns activity    Assist        Assist Level: Dependent - Patient 0%    Wheelchair 150 feet activity     Assist      Assist Level: Dependent - Patient 0%   Blood pressure 121/74, pulse 71, temperature 97.6 F (36.4 C), temperature source Oral, resp. rate 17, weight 66.5 kg, SpO2 100 %.  Medical Problem List and Plan: 1. Functional deficits secondary to   Right MCA infarct s/p TNK and IR with TICI2c, R ICA stent with left hemiplegia             -patient may  shower             -ELOS/Goals: 12-14 days             -Continue CIR, Team conference today please see physician documentation under team conference tab, met with team  to discuss problems,progress, and goals. Formulized individual treatment plan based on medical history, underlying problem and comorbidities.              -PRAFO ordered            dc amantadine, no issues with arousal , may be causing side  effects  2.  Antithrombotics: -DVT/anticoagulation:  Pharmaceutical: Lovenox             -antiplatelet therapy: ASA/Brilinta.  3. Headache: scheduled tylenol 6558mQID. Also on topirimate 10083mID- pt states she's had this since CVA  --Will add aquatherapy for local measures.  4. Mood/Behavior/Sleep: LCSW to follow for evaluation and support.              -antipsychotic agents: N/A 5. Neuropsych/cognition: This patient may not be fully capable of making decisions on her own behalf. 6. Skin/Wound Care: Routine pressure relief measures, small ant delt sub cut cyst, does not appear inflammed likely chronic 7. Fluids/Electrolytes/Nutrition: Monitor I/O. Poor intake Check BMET in am.  8. CAS s/p stent: On ASA/Brilinta 9. HTN: Monitor BP TID--BP fluctuates, continue Losartan 3m62m0/16: BP up to 189/52 cont amlodipine 5mg 17mals:   02/23/22 2001 02/24/22 0458  BP: (!) 161/68 121/74  Pulse: 69 71  Resp: 16 17  Temp: 98.1 F (36.7 C) 97.6 F (36.4 C)  SpO2: 96% 100%  BP improved 10/18, still with intermittent lability  10. Leucocytosis/UTI: Treated with rocephin. 11. AKI: Improved with  IVF on board-->d/c and encourage fluid intake.     Latest Ref Rng & Units 02/22/2022    5:50 AM 02/17/2022    8:00 AM 02/16/2022    4:00 AM  BMP  Glucose  70 - 99 mg/dL 107  109  114   BUN 8 - 23 mg/dL '20  24  25   ' Creatinine 0.44 - 1.00 mg/dL 1.05  1.09  1.00   Sodium 135 - 145 mmol/L 137  138  135   Potassium 3.5 - 5.1 mmol/L 3.5  3.7  3.7   Chloride 98 - 111 mmol/L 107  110  108   CO2 22 - 32 mmol/L '19  19  17   ' Calcium 8.9 - 10.3 mg/dL 9.7  9.0  9.0   If BUN climbs above 30 will restart nocturnal IVF  12. Dysphagia: Continue D3, honey thick liquids. Will d/c IVF and monitor hydration status.             --encourage water protocol 13. Urinary retention will cont flomax, I/O caths, UC + enterococcus, f/u sensitivities, discussed with pharmacy, 1 dose fosfomycin recommended received on 10/15 14. Headache             -Continue Topamax and Fioricet prn 15. HLD             -Continue crestor 16. COPD             -Albuterol PRN 17. Hypothyroidism             -Continue synthroid 18. Low back pain: encouraged use of heating pad.   19. Sore throat and myalgias- no fever or respiratory sx, check Covid 19 PCR, first 1 is negative await second swab results, continue quarantine unless results are negative x2  LOS: 8 days A FACE TO FACE EVALUATION WAS PERFORMED  Charlett Blake 02/24/2022, 9:37 AM

## 2022-02-24 NOTE — Patient Care Conference (Signed)
Inpatient RehabilitationTeam Conference and Plan of Care Update Date: 02/24/2022   Time: 10:11 AM    Patient Name: Julia Deleon      Medical Record Number: [id number]  Date of Birth: 07/02/1945 Sex: Female         Room/Bed: 4W13C/4W13C-01 Payor Info: Payor: MEDICARE / Plan: MEDICARE PART A AND B / Product Type: *No Product type* /    Admit Date/Time:  02/16/2022  2:30 PM  Primary Diagnosis:  Acute ischemic right MCA stroke [facility])  Hospital Problems: Principal Problem:   Acute ischemic right MCA stroke [facility])    Expected Discharge Date: Expected Discharge Date: 03/04/22 (SNF pending)  Team Members Present: Physician leading conference: Dr. Alysia Penna Social Worker Present: Erlene Quan, [place] Nurse Present: Dorien Chihuahua, RN PT Present: Barrie Folk, PT OT Present: Meriel Pica, OT SLP Present: Sherren Kerns, SLP PPS Coordinator present : Gunnar Fusi, SLP     Current Status/Progress Goal Weekly Team Focus  Bowel/Bladder   Incont of Bowel, IN & out cath Q6h  Regain cont of b/b  Toileting schedule   Swallow/Nutrition/ Hydration   dys 3 diet with nectar thick liquids; sup A  mod I  tolerance of current diet, ongoing thin liquid trials following oral care prior to consideration of instrumental swallow assessment   ADL's   max A UB self care, total of 2 with LB self care,  max to stand, max of 2 with slide board transfers, severe subluxation, no active LUE movement, severe L lean, very impaired attention span.  min A overall  LUE NMR, postural control, L visual scanning, attention, ADL training   Mobility   Pusher with L inattention, currently c/o sore throat and increased lethargy ; bed mobility = ModA, transfers = MaxA +2, no ambulation yet.  overall ModA - with time has potential to reach MinA  L NMR, standing balance and posture, sitting balance, bed mobility, transfers, ambulation, family education if going home   Communication             Safety/Cognition/  Behavioral Observations  max A  min A  attention, memory, problem solving, visual scanning, intellectual awareness   Pain   C/o Headache, neck pain and left knee pain  pain level <3  Assess Q shift & PRN   Skin   Skin intact, generalized bruising on abdomen  keep skin intact  assess Q Shift and PRN     Discharge Planning:  Patient discharging to SNF.   Team Discussion: Patient with cyst in left shoulder with subluxation and pain with ROM. BP labile but improved per MD.  Requiring I+O caths post foley removal on flomax. UTI treated.  Progress limited by postural control issues, poor midline orientation, flex tone, perseveration and poor awareness of deficits.   Patient on target to meet rehab goals: no, downgraded goals for discharge. Needs +2 assist for transfers, max assist for cognition but supervision for swallowing.   *See Care Plan and progress notes for long and short-term goals.   Revisions to Treatment Plan:  Downgraded goals Po trials  Teaching Needs: Safety, medications, dietary modifications, transfers, toileting, I+O caths, etc  Current Barriers to Discharge: Decreased caregiver support, Home enviroment access/layout, and Neurogenic bowel and bladder  Possible Resolutions to Barriers: SNF recommended     Medical Summary Current Status: COVID-19 infection rule out, negative x1 await second PCR today, continuing quarantine, blood pressure improving although still labile, treated for UTI last week  Barriers to Discharge: Medical stability  Possible Resolutions to [employer] Focus: Continue quarantine pending COVID-19 PCR results, continue therapy efforts, monitor blood pressure lability   Continued Need for Acute Rehabilitation Level of Care: The patient requires daily medical management by a physician with specialized training in physical medicine and rehabilitation for the following reasons: Direction of a multidisciplinary physical rehabilitation program to  maximize functional independence : Yes Medical management of patient stability for increased activity during participation in an intensive rehabilitation regime.: Yes Analysis of laboratory values and/or radiology reports with any subsequent need for medication adjustment and/or medical intervention. : Yes   I attest that I was present, lead the team conference, and concur with the assessment and plan of the team.   Dorien Chihuahua B 02/24/2022, 1:27 PM

## 2022-02-24 NOTE — Progress Notes (Signed)
Physical Therapy Session Note  Patient Details  Name: Julia Deleon MRN: [id number] Date of Birth: 03/25/1946  Today's Date: 02/24/2022 PT Individual Time: 1315-1402 PT Individual Time Calculation (min): 47 min   Short Term Goals: Week 1:  PT Short Term Goal 1 (Week 1): Pt will sit EOB with mod Assist up to 3 minteus PT Short Term Goal 2 (Week 1): Pt will initiate gait training PT Short Term Goal 3 (Week 1): Pt will trasnfer to [facility] with max assist of 1 PT Short Term Goal 4 (Week 1): Pt will perform sit<>stand with max assist of 1` PT Short Term Goal 5 (Week 1): Pt will perform FIST assessment  Skilled Therapeutic Interventions/Progress Updates:   Pt received supine in bed and agreeable to PT. Supine>sit transfer with *** assist and ***cues   Sitting balance EOB.   Squat pivot transfer to WC. Sit<>stand at rail in hall with mod assist x 3 and LLre blocked.    Gait training at rail in hall with DF wrap on the LLE and max assist for LLE management; 54f, 557fand 1213f +2 for WC follow.    Pt returned to room and performed ** transfer to bed with **. Sit>supine completed with ** and left supine in bed with call bell in reach and all needs met.       Therapy Documentation Precautions:  Precautions Precautions: Fall Precaution Comments: L hemiparesis, L inattention Restrictions Weight Bearing Restrictions: No General: PT Amount of Missed Time (min): 13 Minutes PT Missed Treatment Reason: Nursing care Vital Signs:   Pain: Pain Assessment Pain Scale: 0-10 Pain Score: 7  Pain Type: Acute pain Pain Location: Back Pain Orientation: Right Pain Descriptors / Indicators: Aching Patients Stated Pain Goal: 0 Pain Intervention(s): Repositioned Mobility:   Locomotion :    Trunk/Postural Assessment :    Balance:   Exercises:   Other Treatments:      Therapy/Group: Individual Therapy  AusLorie Phenix/18/2023, 2:07 PM

## 2022-02-24 NOTE — Plan of Care (Signed)
Problem: RH Dressing Goal: LTG Patient will perform upper body dressing (OT) Description: LTG Patient will perform upper body dressing with min assist. Flowsheets (Taken 02/24/2022 1245) LTG: Pt will perform upper body dressing with assistance level of: (LTG downgraded as pt will need increased A due to severe L hemiplegia with lateral lean and limited attention span.) Moderate Assistance - Patient 50 - 74% Note: LTG downgraded as pt will need increased A due to severe L hemiplegia with lateral lean and limited attention span. Goal: LTG Patient will perform lower body dressing w/assist (OT) Description: LTG: Patient will perform lower body dressing with moderate assist AD PRN.  Flowsheets (Taken 02/24/2022 1245) LTG: Pt will perform lower body dressing with assistance level of: (LTG downgraded as pt will need increased A due to severe L hemiplegia with lateral lean and limited attention span.) Maximal Assistance - Patient 25 - 49% Note: LTG downgraded as pt will need increased A due to severe L hemiplegia with lateral lean and limited attention span.   Problem: RH Toileting Goal: LTG Patient will perform toileting task (3/3 steps) with assistance level (OT) Description: LTG: Patient will perform toileting task (3/3 steps) with minimal assistance. Flowsheets (Taken 02/24/2022 1245) LTG: Pt will perform toileting task (3/3 steps) with assistance level: (LTG downgraded as pt will need increased A due to severe L hemiplegia with lateral lean and limited attention span.) Total Assistance - Patient < 25% Note: LTG downgraded as pt will need increased A due to severe L hemiplegia with lateral lean and limited attention span.   Problem: RH Functional Use of Upper Extremity Goal: LTG Patient will use RT/LT upper extremity as a (OT) Description: LTG: Patient will use LUE as a gross assist without verbal cues during functional activity.  Flowsheets (Taken 02/24/2022 1245) LTG: Use of upper extremity in  functional activities: RUE as a stabilizer LTG: Pt will use upper extremity in functional activity with assistance level of: (LTG downgraded as pt will need increased A due to severe L hemiplegia with lateral lean and limited attention span.) Moderate Assistance - Patient 50 - 74% Note: LTG downgraded as pt will need increased A due to severe L hemiplegia with lateral lean and limited attention span.   Problem: RH Toilet Transfers Goal: LTG Patient will perform toilet transfers w/assist (OT) Description: LTG: Patient will perform toilet transfers with min assist DME PRN.  Flowsheets (Taken 02/24/2022 1245) LTG: Pt will perform toilet transfers with assistance level of: (LTG downgraded as pt will need increased A due to severe L hemiplegia with lateral lean and limited attention span.) Maximal Assistance - Patient 25 - 49% Note: LTG downgraded as pt will need increased A due to severe L hemiplegia with lateral lean and limited attention span.   Problem: RH Tub/Shower Transfers Goal: LTG Patient will perform tub/shower transfers w/assist (OT) Description: LTG: Patient will perform tub/shower transfers with min assist with DME PRN  Flowsheets (Taken 02/24/2022 1245) LTG: Pt will perform tub/shower stall transfers with assistance level of: (LTG discontinued as shower transfers are not safe due to her severely impaired postural control.) -- Note: LTG discontinued as shower transfers are not safe due to her severely impaired postural control.   Problem: RH Memory Goal: LTG Patient will demonstrate ability for day to day recall/carry over during activities of daily living with assistance level (OT) Description: LTG:  Patient will demonstrate ability for day to day recall/carry over during activities of daily living with minimal assistance.  Flowsheets (Taken 02/24/2022 1245) LTG:  Patient will demonstrate ability for day to day recall/carry over during activities of daily living with assistance level  (OT): Moderate Assistance - Patient 50 - 74%

## 2022-02-24 NOTE — Progress Notes (Signed)
Patient ID: Julia Deleon, female   DOB: 09-22-1945, 76 y.o.   MRN: [id number]  Team Conference Report to Patient/Family  Team Conference discussion was reviewed with the patient and caregiver, including goals, any changes in plan of care and target discharge date.  Patient and caregiver express understanding and are in agreement.  The patient has a target discharge date of 03/04/22 (SNF pending).   SW met with patient and provided team conference updates. Patient anticipates SNF. SW will provided and discuss the Medicare rating list with patient tomorrow to help determine what facility she prefers.  [name] 02/24/2022, 1:25 PM

## 2022-02-24 NOTE — Progress Notes (Signed)
Speech Language Pathology Weekly Progress and Session Note  Patient Details  Name: Julia Deleon MRN: [id number] Date of Birth: Jun 08, 1945  Beginning of progress report period: February 17, 2022 End of progress report period: March 06, 2022  Today's Date: 02/24/2022 SLP Individual Time: 1107-1204 SLP Individual Time Calculation (min): 57 min  Short Term Goals: Week 1: SLP Short Term Goal 1 (Week 1): Pt will participate in controlled trials of ice chips and thin water with minimal s/sx concerning for airway compromise to prepare for repeat instrumental swallow study. SLP Short Term Goal 1 - Progress (Week 1): Met SLP Short Term Goal 2 (Week 1): Pt will sustain attention to therapeutic tasks for 10 minutes with Min A for redirection. SLP Short Term Goal 2 - Progress (Week 1): Not met SLP Short Term Goal 3 (Week 1): Pt will verbalize 3 physical and/or cognitive deficits s/p CVA given Max A. SLP Short Term Goal 3 - Progress (Week 1): Not met SLP Short Term Goal 4 (Week 1): Pt will recall 3 pieces of functional information by utilizing external aids with Max A. SLP Short Term Goal 4 - Progress (Week 1): Met SLP Short Term Goal 5 (Week 1): Pt will attend to L visual field during table top tasks given Max A for use of compensatory visual attention strategies. SLP Short Term Goal 5 - Progress (Week 1): Met SLP Short Term Goal 6 (Week 1): Pt will participate in basic problem-solving tasks given Max A. SLP Short Term Goal 6 - Progress (Week 1): Met    New Short Term Goals: Week 2: SLP Short Term Goal 1 (Week 2): STG=LTG due to ELOS  Weekly Progress Updates: Pt has demonstrated functional gains, as evident by meeting 4 out of 6 short-term goals this reporting period. Pt continues to be limited by fatigue, ongoing discomfort, L inattention, and and severity of deficits. Pt is currently requiring max A verbal/visual cues for completion of basic level cognitive tasks in regards to sustained attention,  problem solving, recall, and awareness of deficits. Pt is currently consuming and dysphagia 3 diet and nectar thick liquids with and is mod I for implementation of swallowing safety strategies. SLP initiated the water protocol as of 10/18. Plan for instrumental swallow evaluation next week. Pt and family education ongoing re: diet recommendations, supportive cognitive-communication interventions, and ST POC. Recommend additional therapy services at discharge in SNF setting.    Intensity: Minumum of 1-2 x/day, 30 to 90 minutes Frequency: 3 to 5 out of 7 days Duration/Length of Stay: 10/26 - SNF pending Treatment/Interventions: Cognitive remediation/compensation;Dysphagia/aspiration precaution training;Functional tasks;Environmental controls;Patient/family education;Medication managment;Internal/external aids   Daily Session Skilled Therapeutic Interventions: Skilled ST treatment focused on cognitive and swallowing goals. Pt was greeted upright in wheelchair. Isolation precautions were lifted due to negative Covid test x2. SLP facilitated cognitive goals with ALFA subtests including "counting money" task with 0/10 accuracy progressing to 5/10 with max A verbal redirection cues for attention, problem solving, working memory, and required extensive processing time. Pt completed "using a calendar" task with 7/10 accuracy. Pt sustained attention to these structured tasks for 3-5 minute intervals and decreasing to ~30 seconds to 1 minute due to increasing fatigue. Pt required max A verbal cues to attend left of midline which was more apparent during counting money task as compared to calendar task.   Pt completed oral care with set-up A. Pt consumed thin liquid sips by cup with overt s/sx of aspiration during 4 of 9 occasions as evidenced by cough  response x2 and throat clear x2. Pt was mod I for implementation of small, single sips.Continue with nectar thick liquids at this time. Plan to initiate water protocol  to increase hydration. SLP educated on water protocol guidelines and placed signs in room; notified nurse. Anticipate readiness for repeat MBS next wee with ongoing trials with SLP.  Patient was left in wheelchair with alarm activated and immediate needs within reach at end of session. Continue per current plan of care.       General    Pain Pain Assessment Pain Scale: 0-10 Pain Score: 7  Pain Type: Acute pain Pain Location: Back Pain Orientation: Right Pain Descriptors / Indicators: Aching Patients Stated Pain Goal: 0 Pain Intervention(s): Repositioned  Therapy/Group: Individual Therapy  Patty Sermons 02/24/2022, 12:57 PM

## 2022-02-25 LAB — GLUCOSE, CAPILLARY
Glucose-Capillary: 116 mg/dL — ABNORMAL HIGH (ref 70–99)
Glucose-Capillary: 111 mg/dL — ABNORMAL HIGH (ref 70–99)
Glucose-Capillary: 92 mg/dL (ref 70–99)
Glucose-Capillary: 127 mg/dL — ABNORMAL HIGH (ref 70–99)

## 2022-02-25 NOTE — Progress Notes (Signed)
PROGRESS NOTE   Subjective/Complaints:  Covid PCR neg on 10/16 and 10/18 may d/c precautions     ROS- neg CP, SOB, N/V/D, +headache, +shoulder pain  Objective:   No results found. No results for input(s): "WBC", "HGB", "HCT", "PLT" in the last 72 hours.  No results for input(s): "NA", "K", "CL", "CO2", "GLUCOSE", "BUN", "CREATININE", "CALCIUM" in the last 72 hours.   Intake/Output Summary (Last 24 hours) at 02/25/2022 0819 Last data filed at 02/25/2022 0314 Gross per 24 hour  Intake 480 ml  Output 1125 ml  Net -645 ml         Physical Exam: Vital Signs Blood pressure 123/70, pulse 89, temperature 98 F (36.7 C), resp. rate 17, weight 66.5 kg, SpO2 98 %. HEENT- AT, [place], no skin lesions on ear , no sinus tenderness General: No acute distress Mood and affect are appropriate Heart: Regular rate and rhythm no rubs murmurs or extra sounds Lungs: Clear to auscultation, breathing unlabored, no rales or wheezes Abdomen: Positive bowel sounds, soft nontender to palpation, nondistended Extremities: No clubbing, cyanosis, or edema Skin: No evidence of breakdown, no evidence of rash, ~1cm subcut cyst, not erythematous, non tender    Neurologic: Cranial nerves II through XII intact, motor strength is 5/5 in right and 0/5 deltoid, 2- horz add, 2- bicep, tricep, grip, 2-/5 hip flexor, knee extensors, 0/5 ankle dorsiflexor and plantar flexor   Musculoskeletal: mild pain with head rotation     Assessment/Plan: 1. Functional deficits which require 3+ hours per day of interdisciplinary therapy in a comprehensive inpatient rehab setting. Physiatrist is providing close team supervision and 24 hour management of active medical problems listed below. Physiatrist and rehab team continue to assess barriers to discharge/monitor patient progress toward functional and medical goals  Care Tool:  Bathing    Body parts bathed by  patient: Left arm, Chest, Abdomen, Face, Left upper leg, Right upper leg   Body parts bathed by helper: Right arm, Front perineal area, Buttocks     Bathing assist Assist Level: Maximal Assistance - Patient 24 - 49%     Upper Body Dressing/Undressing Upper body dressing   What is the patient wearing?: Pull over shirt    Upper body assist Assist Level: Total Assistance - Patient < 25%    Lower Body Dressing/Undressing Lower body dressing      What is the patient wearing?: Pants, Incontinence brief     Lower body assist Assist for lower body dressing: 2 Helpers     Toileting Toileting    Toileting assist Assist for toileting: 2 Helpers     Transfers Chair/bed transfer  Transfers assist  Chair/bed transfer activity did not occur: Safety/medical concerns  Chair/bed transfer assist level: Maximal Assistance - Patient 25 - 49%     Locomotion Ambulation   Ambulation assist   Ambulation activity did not occur: Safety/medical concerns      Max distance: Tilt in space wc   Walk 10 feet activity   Assist  Walk 10 feet activity did not occur: Safety/medical concerns        Walk 50 feet activity   Assist Walk 50 feet with 2 turns activity did not  occur: Safety/medical concerns         Walk 150 feet activity   Assist Walk 150 feet activity did not occur: Safety/medical concerns         Walk 10 feet on uneven surface  activity   Assist Walk 10 feet on uneven surfaces activity did not occur: Safety/medical concerns         Wheelchair     Assist Is the patient using a wheelchair?: Yes Type of Wheelchair: Manual    Wheelchair assist level: Dependent - Patient 0% Max wheelchair distance: 121f in TIS    Wheelchair 50 feet with 2 turns activity    Assist        Assist Level: Dependent - Patient 0%   Wheelchair 150 feet activity     Assist      Assist Level: Dependent - Patient 0%   Blood pressure 123/70, pulse 89,  temperature 98 F (36.7 C), resp. rate 17, weight 66.5 kg, SpO2 98 %.  Medical Problem List and Plan: 1. Functional deficits secondary to   Right MCA infarct s/p TNK and IR with TICI2c, R ICA stent with left hemiplegia             -patient may  shower             -ELOS/Goals: 12-14 days             -plan is for SNF post d/c              -PRAFO ordered            dc amantadine, no issues with arousal , may be causing side  effects  2.  Antithrombotics: -DVT/anticoagulation:  Pharmaceutical: Lovenox             -antiplatelet therapy: ASA/Brilinta.  3. Headache: scheduled tylenol '650mg'$  QID. Also on topirimate '100mg'$  BID- pt states she's had this since CVA  --Will add aquatherapy for local measures.  Also has post CVA left shoulder pain ,cont PT, OT, may benefit from FES to Left shoulder  4. Mood/Behavior/Sleep: LCSW to follow for evaluation and support.              -antipsychotic agents: N/A 5. Neuropsych/cognition: This patient may not be fully capable of making decisions on her own behalf. 6. Skin/Wound Care: Routine pressure relief measures, small ant delt sub cut cyst, does not appear inflammed likely chronic 7. Fluids/Electrolytes/Nutrition: Monitor I/O. Poor intake should improve if fluids can be upgraded to thins  8. CAS s/p stent: On ASA/Brilinta 9. HTN: Monitor BP TID--BP fluctuates, continue Losartan '25mg'$ . 10/16: BP up to 189/52 cont amlodipine '5mg'$  Vitals:   02/24/22 1942 02/25/22 0315  BP: 130/89 123/70  Pulse: 72 89  Resp: 16 17  Temp: 98 F (36.7 C) 98 F (36.7 C)  SpO2: 100% 98%  BP improved 10/19  10. Leucocytosis/UTI: Treated with rocephin. 11. AKI: Improved with IVF on board-->d/c and encourage fluid intake.     Latest Ref Rng & Units 02/22/2022    5:50 AM 02/17/2022    8:00 AM 02/16/2022    4:00 AM  BMP  Glucose 70 - 99 mg/dL 107  109  114   BUN 8 - 23 mg/dL '20  24  25   '$ Creatinine 0.44 - 1.00 mg/dL 1.05  1.09  1.00   Sodium 135 - 145 mmol/L 137  138  135    Potassium 3.5 - 5.1 mmol/L 3.5  3.7  3.7   Chloride 98 -  111 mmol/L 107  110  108   CO2 22 - 32 mmol/L '19  19  17   '$ Calcium 8.9 - 10.3 mg/dL 9.7  9.0  9.0   If BUN climbs above 30 will restart nocturnal IVF  12. Dysphagia: Continue D3, honey thick liquids. Will d/c IVF and monitor hydration status.             --encourage water protocol, as d/w  SLP repeat MBS next week  13. Urinary retention will cont flomax, I/O caths, UC + enterococcus, f/u sensitivities, discussed with pharmacy, 1 dose fosfomycin recommended received on 10/15, will trial low dose urecholine 14. Headache             -Continue Topamax and  d/c Fioricet  only 1 dose taken in 3 d 15. HLD             -Continue crestor 16. COPD             -Albuterol PRN 17. Hypothyroidism             -Continue synthroid 18. Low back pain: encouraged use of heating pad.   19. Sore throat and myalgias- Covid neg x 2 , body aches may be related to chronic pain and immobility  20.  Incont of bowel and bladder poor awareness and mobility issues contributing  LOS: 9 days A FACE TO FACE EVALUATION WAS PERFORMED  Charlett Blake 02/25/2022, 8:19 AM

## 2022-02-25 NOTE — NC FL2 (Signed)
[place] LEVEL OF CARE SCREENING TOOL     IDENTIFICATION  Patient Name: Julia Deleon Birthdate: 02/28/1946 Sex: female Admission Date (Current Location): 02/16/2022  [facility] and [place] Number:  Engineering geologist and Address:  [facility] [place], [place] [address] [place], [place] [address]      Provider Number:    Attending Physician Name and Address:  Charlett Blake, MD  Relative Name and Phone Number:  Self, spouse or sister    Current Level of Care: Hospital Recommended Level of Care: [place] Prior Approval Number:    Date Approved/Denied:   PASRR Number: [id number]  Discharge Plan: SNF    Current Diagnoses: Patient Active Problem List   Diagnosis Date Noted   Acute ischemic right MCA stroke ([place]) 02/16/2022   Dysphagia 02/08/2022   Urinary retention 02/08/2022   Carotid stenosis 02/08/2022   Hypotension 01/30/2022   Bradycardia 01/30/2022   Acute ischemic stroke ([place]) 01/29/2022   Essential hypertension 01/23/2021   Cystocele with prolapse 07/30/2020   Postsurgical arthrodesis status 07/25/2020   Facet arthropathy 04/08/2020   Family history of breast cancer 11/16/2019   Body mass index (BMI) 25.0-25.9, adult 07/24/2019   UTI (urinary tract infection) 12/06/2018   Status post lumbar surgery 12/06/2018   Spondylolisthesis, lumbar region 09/26/2018   Aortic atherosclerosis ([place]) 08/31/2018   Headache 03/29/2018   CAD (coronary artery disease) 02/23/2018   Advanced care planning/counseling discussion 03/28/2017   Varicose veins of both lower extremities with complications 02/72/5366   Arthritis 12/09/2016   Vaginal atrophy 09/10/2015   Vasomotor symptoms due to menopause 09/10/2015   Onychomycosis due to dermatophyte 04/07/2015   Bursitis of right shoulder 02/26/2015   Environmental and seasonal allergies 02/26/2015   Other allergic rhinitis 02/26/2015   Osteoporosis 11/26/2014   COPD (chronic  obstructive pulmonary disease) ([place]) 11/26/2014   Fuchs' corneal dystrophy 11/26/2014   Benign hypertensive renal disease 11/26/2014   Hyperlipidemia 11/26/2014   Chronic kidney disease, stage III (moderate) ([place]) 11/26/2014   Hypothyroidism 11/26/2014   Endothelial corneal dystrophy 11/26/2014    Orientation RESPIRATION BLADDER Height & Weight     Self, Situation, Place, Time  Normal Incontinent (in & out cath) Weight: 146 lb 9.7 oz (66.5 kg) Height:     BEHAVIORAL SYMPTOMS/MOOD NEUROLOGICAL BOWEL NUTRITION STATUS      Continent Diet  AMBULATORY STATUS COMMUNICATION OF NEEDS Skin   Extensive Assist Verbally Normal                       Personal Care Assistance Level of Assistance  Bathing, Feeding, Dressing Bathing Assistance: Limited assistance Feeding assistance: Limited assistance Dressing Assistance: Limited assistance     Functional Limitations Info  [place]  PT (By licensed PT), OT (By licensed OT), Speech therapy     PT Frequency: 5x a week OT Frequency: 5x a week     Speech Therapy Frequency: 5x      Contractures Contractures Info: Not present    Additional Factors Info  Code Status Code Status Info: Full             Current Medications (02/25/2022):  This is the current hospital active medication list Current Facility-Administered Medications  Medication Dose Route Frequency Provider Last Rate Last Admin   acetaminophen (TYLENOL) tablet 650 mg  650 mg Oral TID WC & HS Kirsteins, Luanna Salk, MD  650 mg at 02/25/22 1202   albuterol (PROVENTIL) (2.5 MG/3ML) 0.083% nebulizer solution 2.5 mg  2.5 mg Nebulization Q6H PRN Love, Pamela S, PA-C       alum & mag hydroxide-simeth (MAALOX/MYLANTA) 200-200-20 MG/5ML suspension 30 mL  30 mL Oral Q4H PRN Love, Pamela S, PA-C       amLODipine (NORVASC) tablet 5 mg  5 mg Oral Daily Raulkar, Clide Deutscher, MD   5 mg at 02/25/22 0805   aspirin chewable tablet 81 mg  81 mg Oral  Daily Bary Leriche, PA-C   81 mg at 02/25/22 0805   azelastine (ASTELIN) 0.1 % nasal spray 1 spray  1 spray Each Nare BID Charlett Blake, MD   1 spray at 02/25/22 4098   bisacodyl (DULCOLAX) suppository 10 mg  10 mg Rectal Daily PRN Love, Pamela S, PA-C       butalbital-acetaminophen-caffeine (FIORICET) 50-325-40 MG per tablet 1 tablet  1 tablet Oral Q12H PRN Bary Leriche, PA-C   1 tablet at 02/24/22 0818   diclofenac Sodium (VOLTAREN) 1 % topical gel 2 g  2 g Topical TID Reome, Earle J, RPH   2 g at 02/25/22 1202   diphenhydrAMINE (BENADRYL) 12.5 MG/5ML elixir 12.5-25 mg  12.5-25 mg Oral Q6H PRN Love, Pamela S, PA-C       enoxaparin (LOVENOX) injection 40 mg  40 mg Subcutaneous Q24H Love, Pamela S, PA-C   40 mg at 02/24/22 2038   estradiol (ESTRACE) vaginal cream 0.5 g  0.5 g Vaginal Q48H PRN Love, Pamela S, PA-C       feeding supplement (NEPRO CARB STEADY) liquid 237 mL  237 mL Oral BID PC Love, Pamela S, PA-C   237 mL at 02/20/22 1312   food thickener (SIMPLYTHICK (NECTAR/LEVEL 2/MILDLY THICK)) 1 packet  1 packet Oral PRN Love, Pamela S, PA-C       guaiFENesin-dextromethorphan (ROBITUSSIN DM) 100-10 MG/5ML syrup 5-10 mL  5-10 mL Oral Q6H PRN Love, Pamela S, PA-C       levothyroxine (SYNTHROID) tablet 75 mcg  75 mcg Oral Q0600 Bary Leriche, PA-C   75 mcg at 02/25/22 0616   loratadine (CLARITIN) tablet 10 mg  10 mg Oral Daily Charlett Blake, MD   10 mg at 02/25/22 0806   losartan (COZAAR) tablet 25 mg  25 mg Oral Daily Bary Leriche, PA-C   25 mg at 02/25/22 1191   Oral care mouth rinse  15 mL Mouth Rinse 4 times per day Bary Leriche, PA-C   15 mL at 02/25/22 1202   polyethylene glycol (MIRALAX / GLYCOLAX) packet 17 g  17 g Oral Daily PRN Bary Leriche, PA-C   17 g at 02/21/22 1332   prochlorperazine (COMPAZINE) tablet 5-10 mg  5-10 mg Oral Q6H PRN Love, Pamela S, PA-C       Or   prochlorperazine (COMPAZINE) injection 5-10 mg  5-10 mg Intramuscular Q6H PRN Love, Pamela S, PA-C        Or   prochlorperazine (COMPAZINE) suppository 12.5 mg  12.5 mg Rectal Q6H PRN Love, Pamela S, PA-C       rosuvastatin (CRESTOR) tablet 20 mg  20 mg Oral QPC supper Love, Pamela S, PA-C   20 mg at 02/24/22 1854   senna-docusate (Senokot-S) tablet 1 tablet  1 tablet Oral QHS PRN Raulkar, Clide Deutscher, MD       senna-docusate (Senokot-S) tablet 2 tablet  2 tablet Oral BID Bary Leriche, PA-C  2 tablet at 02/25/22 0806   sodium chloride flush (NS) 0.9 % injection 10-40 mL  10-40 mL Intracatheter Q12H Kirsteins, Luanna Salk, MD   10 mL at 02/23/22 0071   sodium chloride flush (NS) 0.9 % injection 10-40 mL  10-40 mL Intracatheter PRN Charlett Blake, MD   30 mL at 02/17/22 0801   sodium chloride flush (NS) 0.9 % injection 10-40 mL  10-40 mL Intracatheter Q12H Bary Leriche, PA-C   10 mL at 02/23/22 2197   sodium chloride flush (NS) 0.9 % injection 10-40 mL  10-40 mL Intracatheter PRN Love, Pamela S, PA-C       sodium phosphate (FLEET) 7-19 GM/118ML enema 1 enema  1 enema Rectal Once PRN Love, Pamela S, PA-C       tamsulosin (FLOMAX) capsule 0.4 mg  0.4 mg Oral QPC supper Love, Pamela S, PA-C   0.4 mg at 02/24/22 1853   ticagrelor (BRILINTA) tablet 90 mg  90 mg Oral BID Bary Leriche, PA-C   90 mg at 02/25/22 0805   topiramate (TOPAMAX) tablet 100 mg  100 mg Oral BID Bary Leriche, PA-C   100 mg at 02/25/22 0805   traZODone (DESYREL) tablet 25 mg  25 mg Oral QHS PRN Bary Leriche, PA-C   25 mg at 02/24/22 2141     Discharge Medications: Please see discharge summary for a list of discharge medications.  Relevant Imaging Results:  Relevant Lab Results:   Additional Information [id number]  [name]

## 2022-02-25 NOTE — Progress Notes (Signed)
Patient ID: Julia Deleon, female   DOB: 12/28/1945, 76 y.o.   MRN: [id number]  SW met with patient to discuss SNF options. Patient prefers [facility]. Patient will discuss potentially other options with spouse based off [email or web address] resource provided. No additional questions or concerns.

## 2022-02-25 NOTE — Progress Notes (Signed)
Occupational Therapy Session Note  Patient Details  Name: Julia Deleon MRN: [id number] Date of Birth: [birth year]  Today's Date: 02/25/2022 OT Individual Time: 1300-1400 OT Individual Time Calculation (min): 60 min    Short Term Goals: Week 2:  OT Short Term Goal 1 (Week 2): Pt will be able to rise to stand with mod A and then lift up into full extension to enable caregiver to assist with clothing management. OT Short Term Goal 2 (Week 2): pt will be able to maintain sit balance on BSC for 5 minutes without leaning to her L. OT Short Term Goal 3 (Week 2): Pt will be able to bathe with mod A.  Skilled Therapeutic Interventions/Progress Updates:     Pt received in wc in room finishing her lunch upon OT arrival. Pt  noted to be leaning to left side with LUE hanging off armrest. Pt reported she was aware she was leaning and was in the process of call ing for help to be repositioned in her wc. Pt was repositioned and transferred to the therapy gym. Pt receptive of practicing transfers and participating in skilled OT session. Pt completed squat pivot transfer with mod A x1 and +2 min A for safety and to guide Pt's hips onto mat table. Upon sitting on mat table, vertical mirror was placed in front of Pt to provided visual feedback to orient to midline and correct posture. Pt's RUE placed on window seat to Pt's R side to prevent pushing and support proper body alignment. Decreased pushing to L side with RUE support noted. Pt able to maintain midline for 5-10 seconds at a time with min A to facilitate weight shift and moderate verbal cues. Pt neglecting left side despite therapist present on her L side and verbal cues to turn head towards front/left visual field. Pt completed sit<>stands x10 with vertical reaching using RUE to pull squigz off window. Pt required Mod Ax1 and +2 min A for UE support during vertical reaching and sit<>stands. Pt transitioned from sitting on the edge of the mat table to laying  supine with mod A using reverse log roll technique. Pt practiced rolling R<>back x2 with mod A and moderate verbal cueing. In supine, gravity eliminated AROM exercises attempted, but no return of muscle activation noted. Upon Pt. Returning to her back, Pt completed LB exercises knee flexion/extension x5 and hip abduction/adduction with min-mod A to facilitate LLE movement. Pt supine to sit Mod Ax1 rolling toward L side. Pt transferred squat pivot back to her wc with Mod Ax1 and +2 min A and was transported back to her room. Pt was left in bed in her room with nursing present in preparation for catheterization.   Therapy Documentation Precautions:  Precautions Precautions: Fall Precaution Comments: L hemiparesis, L inattention Restrictions Weight Bearing Restrictions: No    Vital Signs: Therapy Vitals Temp: (Abnormal) 97.5 F (36.4 C) Temp Source: Oral Pulse Rate: 70 Resp: 16 BP: (Abnormal) 128/57 Patient Position (if appropriate): Lying Oxygen Therapy SpO2: 100 % O2 Device: Room Air   ADL: ADL Eating: Minimal assistance, Moderate cueing Where Assessed-Eating: Wheelchair Grooming: Moderate assistance Where Assessed-Grooming: Wheelchair, Sitting at sink Upper Body Bathing: Maximal assistance, Maximal cueing Where Assessed-Upper Body Bathing: Sitting at sink, Wheelchair Lower Body Bathing: Maximal assistance Where Assessed-Lower Body Bathing: Wheelchair, Sitting at sink Upper Body Dressing: Maximal cueing, Maximal assistance Where Assessed-Upper Body Dressing: Edge of bed Lower Body Dressing: Maximal assistance Where Assessed-Lower Body Dressing: Edge of bed Toileting: Dependent Where Assessed-Toileting: Bed level  Toilet Transfer: Not assessed Toilet Transfer Method: Not assessed   Therapy/Group: Individual Therapy  Janey Genta 02/25/2022, 4:46 PM

## 2022-02-25 NOTE — Progress Notes (Signed)
Speech Language Pathology Daily Session Note  Patient Details  Name: Julia Deleon MRN: [id number] Date of Birth: 11-27-1945  Today's Date: 02/25/2022 SLP Individual Time: 0803-0900 SLP Individual Time Calculation (min): 57 min  Short Term Goals: Week 2: SLP Short Term Goal 1 (Week 2): STG=LTG due to ELOS  Skilled Therapeutic Interventions: Skilled ST treatment focused on cognitive and swallowing goals. Pt was greeted upright in bed on arrival and appeared in good spirits. Pt consumed breakfast containing dysphagia 3 textures and nectar thick liquids with cough response x1 suspect attributed to mixed consistency. Pt was at supervision level for implementation of safe swallowing precautions and required occasional repositioning due to L lean. SLP reviewed water protocol guidelines. Pt with minimal recall of this discussion from yesterday. Discussed plans for repeat MBS next week with ongoing trials with SLP. Pt verbalized agreement with plan. Reviewed MBS results using images/video from study to reinforce understanding.    SLP facilitated a novel card game targeting memory, attention, visual scanning, and error awareness. Pt completed task with overall mod A verbal/visual cues for use of external aid to recall task instructions, awareness and correction of errors, and verbal redirection cues to reduce rate in which task was completed in order to improve ability to process information. Pt was known to rapidly move through the cards with minimal acknowledgement of errors. There were times SLP requested pt to physically place cards down and out of sight/reach due to quick nature. This improved mildly as task progressed.  Patient was left in bed with alarm activated and immediate needs within reach at end of session. Continue per current plan of care.       Pain Pain Assessment Pain Scale: 0-10 Pain Score: 0-No pain  Therapy/Group: Individual Therapy  Corrinna Karapetyan T Ruble Pumphrey 02/25/2022, 9:02 AM

## 2022-02-25 NOTE — Progress Notes (Signed)
Physical Therapy Session Note  Patient Details  Name: Julia Deleon MRN: [id number] Date of Birth: 1946/02/09  Today's Date: 02/25/2022 PT Individual Time:1050-1200   70 min   Short Term Goals: Week 1:  PT Short Term Goal 1 (Week 1): Pt will sit EOB with mod Assist up to 3 minteus PT Short Term Goal 2 (Week 1): Pt will initiate gait training PT Short Term Goal 3 (Week 1): Pt will trasnfer to [facility] with max assist of 1 PT Short Term Goal 4 (Week 1): Pt will perform sit<>stand with max assist of 1` PT Short Term Goal 5 (Week 1): Pt will perform FIST assessment  Skilled Therapeutic Interventions/Progress Updates:   Pt received supine in bed and agreeable to PT. Rolling R and L with max assist and max cues for attention to task. Pt extremely internally distracted this AM with severe difficulty following instructions throughout session. Supine>sit transfer with total assist and max cues for log toll rechinque.  Sitting balance EOB x 30 minutes with max cues for midline, decreased pushers response, neutral cervical rotation and lateral flexion, with cues for RUE hand position to prevent pushers response. Pt performed sitting with UE supported in lap with max cues for hand in supinated position; pt able to sustain for up to 2 seconds before returning to pronated RUE. Performed sitting balance RUE at least 1 ft from body and max cues to prevent grabbing rail or sliding hand close to hip. Able to perform up to 5 seconds before additional assist required. Total A to don shoes from EOB. Squat pivot transfer to the L with total A and block LLE and cues for UE placement with poor adherence. Pt transporte dto entance of [place]. Seated NMR to perform hip extension from flexion x 10 BLE. AAROM LAQ x 10 BLE, hip abduction adduction x 12 BLE with AAROM on the LLE. Scooting in Wc to prevent sacral sitting x 2 bouts with max assist to perform anterior weight shift. Patient returned to room and left sitting in [facility] with call bell  in reach and all needs met.         Therapy Documentation Precautions:  Precautions Precautions: Fall Precaution Comments: L hemiparesis, L inattention Restrictions Weight Bearing Restrictions: No  Vital Signs: Therapy Vitals Temp: 98.6 F (37 C) Temp Source: Oral Pulse Rate: 67 Resp: 15 BP: 99/60 Patient Position (if appropriate): Lying Oxygen Therapy SpO2: 98 % O2 Device: Room Air Pain: Pain Assessment Pain Scale: 0-10 Pain Score: 6  Pain Type: Acute pain Pain Location: Back PAINAD (Pain Assessment in Advanced Dementia) Breathing: normal    Therapy/Group: Individual Therapy  Lorie Phenix 02/25/2022, 1:42 PM

## 2022-02-26 LAB — GLUCOSE, CAPILLARY
Glucose-Capillary: 105 mg/dL — ABNORMAL HIGH (ref 70–99)
Glucose-Capillary: 123 mg/dL — ABNORMAL HIGH (ref 70–99)
Glucose-Capillary: 133 mg/dL — ABNORMAL HIGH (ref 70–99)

## 2022-02-26 MED ORDER — PHENOL 1.4 % MT LIQD
1.0000 | OROMUCOSAL | Status: DC | PRN
  Administered 2022-02-27 – 2022-03-03 (×4): 1 via OROMUCOSAL
  Filled 2022-02-26: qty 177

## 2022-02-26 MED ORDER — BETHANECHOL CHLORIDE 10 MG PO TABS
10.0000 mg | ORAL_TABLET | Freq: Three times a day (TID) | ORAL | Status: DC
  Administered 2022-02-26 – 2022-03-02 (×15): 10 mg via ORAL
  Filled 2022-02-26 (×15): qty 1

## 2022-02-26 NOTE — Evaluation (Signed)
Recreational Therapy Assessment and Plan  Patient Details  Name: Julia Deleon MRN: [id number] Date of Birth: 1945-05-28 Today's Date: 02/26/2022  Rehab Potential:  Good ELOS:   d/c 10/26/ SNF  Assessment  Hospital Problem: Principal Problem:   Acute ischemic right MCA stroke [facility])     Past Medical History:      Past Medical History:  Diagnosis Date   Arthritis     Chronic kidney disease     COPD (chronic obstructive pulmonary disease) ([place])      pt denies    Dyspnea     Family history of adverse reaction to anesthesia      father was slow to wake up   Fuch's endothelial dystrophy     GERD (gastroesophageal reflux disease)     Hyperlipidemia     Hypertension     Hypothyroidism     Pneumonia      hx of    PONV (postoperative nausea and vomiting)      slow to wake up and PONV    Past Surgical History:       Past Surgical History:  Procedure Laterality Date   ABDOMINAL HYSTERECTOMY       APPENDECTOMY       BACK SURGERY   09/26/2018    L4-5 PLIF by Dr. Arnoldo Morale   CARDIAC CATHETERIZATION       CHOLECYSTECTOMY       COLONOSCOPY WITH PROPOFOL N/A 10/23/2020    Procedure: COLONOSCOPY WITH PROPOFOL;  Surgeon: Virgel Manifold, MD;  Location: [facility] ENDOSCOPY;  Service: Endoscopy;  Laterality: N/A;   ESOPHAGOGASTRODUODENOSCOPY N/A 10/23/2020    Procedure: ESOPHAGOGASTRODUODENOSCOPY (EGD);  Surgeon: Virgel Manifold, MD;  Location[facility] ENDOSCOPY;  Service: Endoscopy;  Laterality: N/A;   EYE SURGERY       FOOT SURGERY Right      pin removed left   IR CT HEAD LTD   01/29/2022   IR CT HEAD LTD   01/29/2022   IR CT HEAD LTD   01/29/2022   IR INTRA CRAN STENT   01/29/2022   IR INTRAVSC STENT CERV CAROTID W/O EMB-PROT MOD SED INC ANGIO   01/29/2022   IR PERCUTANEOUS ART THROMBECTOMY/INFUSION INTRACRANIAL INC DIAG ANGIO   01/29/2022   IR US GUIDE VASC ACCESS RIGHT   01/29/2022   laser vein surgery       NASAL SINUS SURGERY       RADIOLOGY WITH ANESTHESIA N/A 01/29/2022     Procedure: IR WITH ANESTHESIA;  Surgeon: Radiologist, Medication, MD;  Location: [place];  Service: Radiology;  Laterality: N/A;   RIGHT/LEFT HEART CATH AND CORONARY ANGIOGRAPHY N/A 12/06/2017    Procedure: RIGHT/LEFT HEART CATH AND CORONARY ANGIOGRAPHY;  Surgeon: Yolonda Kida, MD;  Location: [place] CV LAB;  Service: Cardiovascular;  Laterality: N/A;   ROBOTIC ASSISTED LAPAROSCOPIC SACROCOLPOPEXY Bilateral 07/30/2020    Procedure: XI ROBOTIC ASSISTED LAPAROSCOPIC SACROCOLPOPEXY AND SUPRACERVICAL HYSTERECTOMY WITH BILATERAL SALPINGO OOPHERECTOMY;  Surgeon: Ardis Hughs, MD;  Location: [facility] ORS;  Service: Urology;  Laterality: Bilateral;  REQUESTING 4 HRS   TONSILLECTOMY          Assessment & Plan Clinical Impression: Patient is a 76 year old female with history of CKD, HTN, Fuch's corneal endothelial dystrophy, mild Schatzki's ring (s/p dilatation 6/22),  chronic LBP w/ neurogenic B/B bladder; who was admitted on 01/29/22 with left sided weakness, left facial droop, dysarthria and left neglect followed by collapse to the floor. She was found to have acute  occlusion of proximal M1 with non opacification of right M2/M3 segments  with decreased arborization in right MCA territory, severe stenosis > 75% at origin of R-ICA, high grade narrowing in distal BA and distal V4 segment of R-VA. MRI/MRA revealed moderate stenosis right cavernous ICA, high grade stenosis with asymmetric attenuation of distal MCA branches. She received TNKase and underwent mechanical thrombectomy with TICI2 revascularization with stent,  contrast staining, flow diverters for [facility] and carotid stent in right carotid bifurcation by Dr. Romie Jumper. Post procedure hypotension with bradycardia treated with dopamine. Dr Gardiner Rhyme felt that bradycardia felt to be due to carotid intervention and should improve with time a 2 D echo showed  EF 60-65% (biventricular function) and no significant valvular disease.    Follow up CT  head 09/26 showed multifocal right hemisphere cytotoxic edema including new involvement of right inferior frontal gyrus, right basal gangli and right superior perirolandic cortex and trace IVH/SAH. She tolerated extubation but continued to have somnolence with lack of movement on the left. Cortak placed for nutritional support and she was started on D3 nectars she started showing improvement in mentation with improvement in orientation but developed significant headaches therefore head CT repeated 10/04 showing evolving infarcts right cerebral hemisphere with no hemorrhage. Topamax added and titrated to 100 mg bid with use of Fioricet bid. Amantadine added to help with activation.  Cortack was d/c but IVF added to manage AKI. Leucocytosis felt to be due to UTI/pyuria and treated with rocephin X 7 days. She reports that she has a history of L4-6 fusion several years ago with and continues to have CLBP. She says she initially required IC for bladder management after her surgery however this has resolved and she was continent of bladder. She denies use of any regular laxatives or bowel medications at home. Therapy has been working with patient who continues to be limited by dense L hemiparesis with left inattention, depressed mood, back and right shoulder pain, needs verbal and tactile cues to follow one step commands with delay and generalized weakness.  Patient transferred to CIR on 02/16/2022.   Pt presents with decreased activity tolerance, decreased functional mobility, decreased balance, decreased coordination, decreased vision, decreased midline orientation, left inattention, decreased attention, decreased awareness, decreased problem solving, decreased safety awareness, decreased memory, and delayed processing Limiting pt's independence with leisure/community pursuits.  Met with pt today to discuss TR services including leisure education, activity analysis/modifications and stress management.  Also  discussed the importance of social, emotional, spiritual health in addition to physical health and their effects on overall health and wellness.  Pt stated understanding.    PLAN:  nor further TR as pt is to discharge 10/26/SNF?  Recommendations for other services: None   Discharge Criteria: Patient will be discharged from TR if patient refuses treatment 3 consecutive times without medical reason.  If treatment goals not met, if there is a change in medical status, if patient makes no progress towards goals or if patient is discharged from hospital.  The above assessment, treatment plan, treatment alternatives and goals were discussed and mutually agreed upon: by patient  [place] 02/26/2022, 8:47 AM

## 2022-02-26 NOTE — Plan of Care (Signed)
  Problem: Education: Goal: Understanding of CV disease, CV risk reduction, and recovery process will improve Outcome: Progressing Goal: Individualized Educational Video(s) Outcome: Progressing   Problem: Activity: Goal: Ability to return to baseline activity level will improve Outcome: Progressing   Problem: Cardiovascular: Goal: Ability to achieve and maintain adequate cardiovascular perfusion will improve Outcome: Progressing Goal: Vascular access site(s) Level 0-1 will be maintained Outcome: Progressing   Problem: Health Behavior/Discharge Planning: Goal: Ability to safely manage health-related needs after discharge will improve Outcome: Progressing   Problem: Consults Goal: RH STROKE PATIENT EDUCATION Description: See Patient Education module for education specifics  Outcome: Progressing   Problem: RH BOWEL ELIMINATION Goal: RH STG MANAGE BOWEL WITH ASSISTANCE Description: STG Manage Bowel with Assistance. Outcome: Progressing Goal: RH STG MANAGE BOWEL W/MEDICATION W/ASSISTANCE Description: STG Manage Bowel with Medication with Assistance. Outcome: Progressing   Problem: RH BLADDER ELIMINATION Goal: RH STG MANAGE BLADDER WITH ASSISTANCE Description: STG Manage Bladder With Assistance Outcome: Progressing Goal: RH STG MANAGE BLADDER WITH MEDICATION WITH ASSISTANCE Description: STG Manage Bladder With Medication With Assistance. Outcome: Progressing   Problem: RH SAFETY Goal: RH STG ADHERE TO SAFETY PRECAUTIONS W/ASSISTANCE/DEVICE Description: STG Adhere to Safety Precautions With Assistance/Device. Outcome: Progressing   Problem: RH KNOWLEDGE DEFICIT Goal: RH STG INCREASE KNOWLEDGE OF HYPERTENSION Outcome: Progressing Goal: RH STG INCREASE KNOWLEDGE OF DYSPHAGIA/FLUID INTAKE Outcome: Progressing Goal: RH STG INCREASE KNOWLEGDE OF HYPERLIPIDEMIA Outcome: Progressing Goal: RH STG INCREASE KNOWLEDGE OF STROKE PROPHYLAXIS Outcome: Progressing

## 2022-02-26 NOTE — Progress Notes (Signed)
PROGRESS NOTE   Subjective/Complaints:  Not emptying bladder , on I/O caths, , FLomax  Appetite improving  Still has sore throat but no congestion on breathing issues   ROS- neg CP, SOB, N/V/D, +headache, +shoulder pain  Objective:   No results found. No results for input(s): "WBC", "HGB", "HCT", "PLT" in the last 72 hours.  No results for input(s): "NA", "K", "CL", "CO2", "GLUCOSE", "BUN", "CREATININE", "CALCIUM" in the last 72 hours.   Intake/Output Summary (Last 24 hours) at 02/26/2022 0809 Last data filed at 02/25/2022 2103 Gross per 24 hour  Intake 360 ml  Output 1000 ml  Net -640 ml         Physical Exam: Vital Signs Blood pressure (!) 150/69, pulse 74, temperature 98.6 F (37 C), temperature source Oral, resp. rate 17, weight 68.9 kg, SpO2 99 %. Throat- no lesions, no erythema or adenopathy  General: No acute distress Mood and affect are appropriate Heart: Regular rate and rhythm no rubs murmurs or extra sounds Lungs: Clear to auscultation, breathing unlabored, no rales or wheezes Abdomen: Positive bowel sounds, soft nontender to palpation, nondistended Extremities: No clubbing, cyanosis, or edema Skin: No evidence of breakdown, no evidence of rash, left ant delt ~1cm subcut cyst, not erythematous, non tender    Neurologic: Cranial nerves II through XII intact, motor strength is 5/5 in right and 0/5 deltoid, 2- horz add, 2- bicep, tricep, grip, 2-/5 hip flexor, knee extensors, 0/5 ankle dorsiflexor and plantar flexor   Musculoskeletal: mild pain with head rotation     Assessment/Plan: 1. Functional deficits which require 3+ hours per day of interdisciplinary therapy in a comprehensive inpatient rehab setting. Physiatrist is providing close team supervision and 24 hour management of active medical problems listed below. Physiatrist and rehab team continue to assess barriers to discharge/monitor patient  progress toward functional and medical goals  Care Tool:  Bathing    Body parts bathed by patient: Left arm, Chest, Abdomen, Face, Left upper leg, Right upper leg   Body parts bathed by helper: Right arm, Front perineal area, Buttocks     Bathing assist Assist Level: Maximal Assistance - Patient 24 - 49%     Upper Body Dressing/Undressing Upper body dressing   What is the patient wearing?: Pull over shirt    Upper body assist Assist Level: Total Assistance - Patient < 25%    Lower Body Dressing/Undressing Lower body dressing      What is the patient wearing?: Pants, Incontinence brief     Lower body assist Assist for lower body dressing: 2 Helpers     Toileting Toileting    Toileting assist Assist for toileting: 2 Helpers     Transfers Chair/bed transfer  Transfers assist  Chair/bed transfer activity did not occur: Safety/medical concerns  Chair/bed transfer assist level: Maximal Assistance - Patient 25 - 49%     Locomotion Ambulation   Ambulation assist   Ambulation activity did not occur: Safety/medical concerns      Max distance: Tilt in space wc   Walk 10 feet activity   Assist  Walk 10 feet activity did not occur: Safety/medical concerns        Walk 50  feet activity   Assist Walk 50 feet with 2 turns activity did not occur: Safety/medical concerns         Walk 150 feet activity   Assist Walk 150 feet activity did not occur: Safety/medical concerns         Walk 10 feet on uneven surface  activity   Assist Walk 10 feet on uneven surfaces activity did not occur: Safety/medical concerns         Wheelchair     Assist Is the patient using a wheelchair?: Yes Type of Wheelchair: Manual    Wheelchair assist level: Dependent - Patient 0% Max wheelchair distance: 182f in TIS    Wheelchair 50 feet with 2 turns activity    Assist        Assist Level: Dependent - Patient 0%   Wheelchair 150 feet activity      Assist      Assist Level: Dependent - Patient 0%   Blood pressure (!) 150/69, pulse 74, temperature 98.6 F (37 C), temperature source Oral, resp. rate 17, weight 68.9 kg, SpO2 99 %.  Medical Problem List and Plan: 1. Functional deficits secondary to   Right MCA infarct s/p TNK and IR with TICI2c, R ICA stent with left hemiplegia             -patient may  shower             -ELOS/Goals: 12-14 days             -plan is for SNF post d/c              -PRAFO ordered            dc amantadine, no issues with arousal , may be causing side  effects  2.  Antithrombotics: -DVT/anticoagulation:  Pharmaceutical: Lovenox             -antiplatelet therapy: ASA/Brilinta.  3. Headache: scheduled tylenol '650mg'$  QID. Also on topirimate '100mg'$  BID- pt states she's had this since CVA  --Will add aquatherapy for local measures.  Also has post CVA left shoulder pain ,cont PT, OT, may benefit from FES to Left shoulder  4. Mood/Behavior/Sleep: LCSW to follow for evaluation and support.              -antipsychotic agents: N/A 5. Neuropsych/cognition: This patient may not be fully capable of making decisions on her own behalf. 6. Skin/Wound Care: Routine pressure relief measures, small ant delt sub cut cyst, does not appear inflammed likely chronic 7. Fluids/Electrolytes/Nutrition: Monitor I/O. Poor intake should improve if fluids can be upgraded to thins  8. CAS s/p stent: On ASA/Brilinta 9. HTN: Monitor BP TID--BP fluctuates, continue Losartan '25mg'$ . 10/16: BP up to 189/52 cont amlodipine '5mg'$  Vitals:   02/26/22 0758 02/26/22 0759  BP: (!) 150/69 (!) 150/69  Pulse: 74   Resp:    Temp:    SpO2:    BP improved 10/19  10. Leucocytosis/UTI: Treated with rocephin. 11. AKI: Improved with IVF on board-->d/c and encourage fluid intake.     Latest Ref Rng & Units 02/22/2022    5:50 AM 02/17/2022    8:00 AM 02/16/2022    4:00 AM  BMP  Glucose 70 - 99 mg/dL 107  109  114   BUN 8 - 23 mg/dL '20  24   25   '$ Creatinine 0.44 - 1.00 mg/dL 1.05  1.09  1.00   Sodium 135 - 145 mmol/L 137  138  135  Potassium 3.5 - 5.1 mmol/L 3.5  3.7  3.7   Chloride 98 - 111 mmol/L 107  110  108   CO2 22 - 32 mmol/L '19  19  17   '$ Calcium 8.9 - 10.3 mg/dL 9.7  9.0  9.0   If BUN climbs above 30 will restart nocturnal IVF  12. Dysphagia: Continue D3, honey thick liquids. Will d/c IVF and monitor hydration status.             --encourage water protocol, as d/w  SLP repeat MBS next week  13. Urinary retention will cont flomax, I/O caths, UC + enterococcus, f/u sensitivities, discussed with pharmacy, 1 dose fosfomycin recommended received on 10/15, will trial low dose urecholine 14. Headache             -Continue Topamax and  d/c Fioricet  only 1 dose taken in 3 d 15. HLD             -Continue crestor 16. COPD             -Albuterol PRN 17. Hypothyroidism             -Continue synthroid 18. Low back pain: encouraged use of heating pad.   19. Sore throat and myalgias- Covid neg x 2 , body aches may be related to chronic pain and immobility , add chloracept prn for sore throat 20.  Incont of bowel and bladder poor awareness and mobility issues contributing  LOS: 10 days A FACE TO FACE EVALUATION WAS PERFORMED  Julia Deleon 02/26/2022, 8:09 AM

## 2022-02-26 NOTE — Progress Notes (Signed)
Patient alert, awake and aware. Patient bladder scanned for 290 mL of urine at 6am. Patient requested to wait another 1-2 hours to be intermittently catheterized again. Patient had been catheterized every 6-8 hours. Patient without noted distention, distress or complaints of pain. Will continue to monitor closely.

## 2022-02-26 NOTE — Progress Notes (Signed)
Occupational Therapy Session Note  Patient Details  Name: Julia Deleon MRN: [id number] Date of Birth: [birth year]  Today's Date: 02/26/2022 OT Individual Time: 8527-7824 OT Individual Time Calculation (min): 60 min    Short Term Goals: Week 2:  OT Short Term Goal 1 (Week 2): Pt will be able to rise to stand with mod A and then lift up into full extension to enable caregiver to assist with clothing management. OT Short Term Goal 2 (Week 2): pt will be able to maintain sit balance on BSC for 5 minutes without leaning to her L. OT Short Term Goal 3 (Week 2): Pt will be able to bathe with mod A.  Skilled Therapeutic Interventions/Progress Updates:    Pt received in bed and stated she needed to get cleaned up before she started with bathing and dressing. Pt stated she had a bowel movement.  Upon removal of brief, brief was clean but she did have a small amount of sticky bowel stuck on her. Assisted pt with cleaning.  She demonstrate good initiation with rolling in bed with only min A and improved L head turn and visual scanning to L.  Pt then put in chair position in bed.  From this angle, she was able to wash her R leg and part of L leg. Pt stated she used compressing socks at home and her husband forgot to bring them. She did want to have some form of compression socks today so provided TED hose.  Started pants over feet, applied TEDS and shoes and then pt positioned flat in bed, she then worked on bridging her hips and using R hand to help pull pants over hips with max A.   Pt then rolled to R and pushed to sit with only mod A.  Static sitting EOB was greatly improved today. She was able to hold balance for 1-2 min at a time with close S.  Pt able to keep head upright.  This enabled her to participate in squat pivot transfer much more effectively with mod-max A of 1.  Used Bobath method to facilitate forward lean and weight shift. Pt responded well to this.   Once in wc she continued to maintain  midline with occasional cues to shift to her R.  In wc upright engaged in UB bathing and dressing with mod A.   Overall improved attention span to task and pt not c/o of pain that typically distracts her.   Pt resting in TIS w/c slightly reclined with belt alarm on.    Therapy Documentation Precautions:  Precautions Precautions: Fall Precaution Comments: L hemiparesis, L inattention Restrictions Weight Bearing Restrictions: No    Vital Signs:  Pain:  No c/o pain  ADL: ADL Eating: Minimal assistance, Moderate cueing Where Assessed-Eating: Wheelchair Grooming: Minimal assistance Where Assessed-Grooming: Wheelchair, Sitting at sink Upper Body Bathing: Moderate assistance, Minimal cueing Where Assessed-Upper Body Bathing: Sitting at sink, Wheelchair Lower Body Bathing: Maximal assistance Where Assessed-Lower Body Bathing: Bed level Upper Body Dressing: Moderate assistance, Minimal assistance Where Assessed-Upper Body Dressing: Wheelchair Lower Body Dressing: Maximal assistance Where Assessed-Lower Body Dressing: Bed level Toileting: Dependent Where Assessed-Toileting: Bedside Commode Toilet Transfer: Maximal assistance Toilet Transfer Method: Squat pivot Toilet Transfer Equipment: Drop arm bedside commode     Therapy/Group: Individual Therapy  [place] 02/26/2022, 12:04 PM

## 2022-02-26 NOTE — Progress Notes (Signed)
Physical Therapy Session Note  Patient Details  Name: Julia Deleon MRN: [id number] Date of Birth: [birth year]  Today's Date: 02/26/2022 PT Individual Time: 0805-0850 PT Individual Time Calculation (min): 45 min   Short Term Goals: Week 1:  PT Short Term Goal 1 (Week 1): Pt will sit EOB with mod Assist up to 3 minteus PT Short Term Goal 2 (Week 1): Pt will initiate gait training PT Short Term Goal 3 (Week 1): Pt will trasnfer to [facility] with max assist of 1 PT Short Term Goal 4 (Week 1): Pt will perform sit<>stand with max assist of 1` PT Short Term Goal 5 (Week 1): Pt will perform FIST assessment Week 2:     Skilled Therapeutic Interventions/Progress Updates:  Patient supine in bed. Sitting up and eating breakfast on entrance to room. RN providing morning medications and therapist taking over supervision of meal intake from RN as pt requests to eat breakfast since she is hungry. Patient alert and agreeable to PT session.   Patient with no pain complaint at start of session. But requests voltaren gel from RN on Bil shoulders. RN is able to apply to R shoulder, however L shoulder currently with K-tape applied.   Following breakfast (no swallowing issues and pt taking small bites), pt agreeable to sit up and change t-shirt with time remaining. With turn to R side and cues for bringing L shoulder over and bending L knee to push with foot to bring hips over to R side, noted that pt has been incontinent of bowels and initiated pericare and brief change until NT available to complete. Pt requires Mod/ MaxA to complete positioning of BLE despite vc/ tc and attempt to initiate. Roll to L with CGA/ supervision. Pericare with MaxA, brief donned with MaxA. Pt left with NT in order to complete bladder scan and in/ out cath.   Patient supine in bed at end of session with brakes locked, and all needs within reach.  Therapy Documentation Precautions:  Precautions Precautions: Fall Precaution Comments: L  hemiparesis, L inattention Restrictions Weight Bearing Restrictions: No General:   Vital Signs: Therapy Vitals Pulse Rate: 74 BP: (!) 150/69 Pain:  No apin complaint this session, but did request voltaren gel from nursing at start of session.   Therapy/Group: Individual Therapy  Alger Simons PT, DPT, CSRS 02/26/2022, 8:18 AM

## 2022-02-26 NOTE — Progress Notes (Signed)
Speech Language Pathology Daily Session Note  Patient Details  Name: Julia Deleon MRN: [id number] Date of Birth: December 22, 1945  Today's Date: 02/26/2022 SLP Individual Time: 1200-1245 SLP Individual Time Calculation (min): 45 min  Short Term Goals: Week 2: SLP Short Term Goal 1 (Week 2): STG=LTG due to ELOS  Skilled Therapeutic Interventions: Skilled ST services focused on swallow skills. Pt report completing oral care 5 minute piror to SLP entering. Pt consumed trials of thin liquids via cup sips, demonstrating consistent immediate throat clear with clear vocal quality following and occasional delayed cough. Pt consumed dys 3 textures and nectar thick liquids via cup. Pt demonstrated appropriate bolus cohesion, oral containment, and no overt s/s aspiration. Pt demonstrated ability to clear mild left buccal residue with lingual sweeps, occasional supervision A verbal cues. Pt required supervision A verbal cues to scan left to locate items. Pt was left with chair alarm set. Recommend to continue ST services.      Pain Pain Assessment Pain Score: 0-No pain  Therapy/Group: Individual Therapy  Dollye Glasser  [facility] 02/26/2022, 12:41 PM

## 2022-02-26 NOTE — Progress Notes (Signed)
Occupational Therapy Session Note  Patient Details  Name: Julia Deleon MRN: [id number] Date of Birth: [birth year]  Today's Date: 02/26/2022 OT Individual Time:  -       Short Term Goals: Week 2:  OT Short Term Goal 1 (Week 2): Pt will be able to rise to stand with mod A and then lift up into full extension to enable caregiver to assist with clothing management. OT Short Term Goal 2 (Week 2): pt will be able to maintain sit balance on BSC for 5 minutes without leaning to her L. OT Short Term Goal 3 (Week 2): Pt will be able to bathe with mod A.  Skilled Therapeutic Interventions/Progress Updates:     Pt greeted and received sitting in her wc finishing her lunch. Pt receptive to participate in skilled OT session and open to completing U/LB bathing in shower. Pt reported she needed to use the bathroom at the beginning of session and transferred from her wc to raised toilet seat placed over a standard toilet with mod A and +2 min A for safety to guide her hips. Pt completed a continent bowel movement with max A to complete peri care while standing with Mod A. Pt transferred to tilt in space shower chair with mod Ax1 and +2 min A. During shower, Pt attempting to push toward left side with Max verbal cues provided throughout session to lean toward right, weight shift toward right, and lift head to improve trunk balance and decrease pushing. Pt UB bathing mod A and LB max A. During bathing, pt noted to perseverate on washing certain parts of her body requiring moderate verbal cues to stop task. Pt completed UB dressing with min A using modified hemi-dressing technique and LB dressing with max A. Pt transferred from [facility] to her wc with Mod A. Pt completed grooming at sink brushing her hair with min A at wc level. Pt requested to get back in bed and transferred to her bed with mod A stand pivot. Pt sit>supine mod A with max verbal cues on log rolling technique. Pt was left resting in her bed with bed alarm on,  call bell in reach, and all needs met. Rn notified of Pt status .  Therapy Documentation Precautions:  Precautions Precautions: Fall Precaution Comments: L hemiparesis, L inattention Restrictions Weight Bearing Restrictions: No General:   Vital Signs: Therapy Vitals Temp: (Abnormal) 97.5 F (36.4 C) Temp Source: Oral Pulse Rate: 74 Resp: 16 BP: 101/75 Patient Position (if appropriate): Lying Oxygen Therapy SpO2: 99 % O2 Device: Room Air Pain: Pain Assessment Pain Score: 0-No pain ADL: ADL Eating: Minimal assistance, Moderate cueing Where Assessed-Eating: Wheelchair Grooming: Minimal assistance Where Assessed-Grooming: Wheelchair, Sitting at sink Upper Body Bathing: Moderate assistance Where Assessed-Upper Body Bathing: Shower, Other (Comment) (Tilit in space shower chair) Lower Body Bathing: Maximal assistance Where Assessed-Lower Body Bathing: Shower (Tilt in space shower chair) Upper Body Dressing: Minimal assistance Where Assessed-Upper Body Dressing: Wheelchair Lower Body Dressing: Maximal assistance Where Assessed-Lower Body Dressing: Wheelchair Toileting: Maximal assistance Where Assessed-Toileting: Other (Comment) (Elevated toilet seat) Toilet Transfer: Maximal assistance Toilet Transfer Method: Engineer, water: Raised toilet seat Social research officer, government: Maximal assistance Social research officer, government Method: Radiographer, therapeutic: Other (comment) (Tilt in space shower chair)    Therapy/Group: Individual Therapy  Janey Genta 02/26/2022, 4:07 PM

## 2022-02-27 LAB — GLUCOSE, CAPILLARY
Glucose-Capillary: 104 mg/dL — ABNORMAL HIGH (ref 70–99)
Glucose-Capillary: 119 mg/dL — ABNORMAL HIGH (ref 70–99)
Glucose-Capillary: 124 mg/dL — ABNORMAL HIGH (ref 70–99)
Glucose-Capillary: 94 mg/dL (ref 70–99)

## 2022-02-27 NOTE — Progress Notes (Signed)
Physical Therapy Weekly Progress Note  Patient Details  Name: Julia Deleon MRN: [id number] Date of Birth: 1945/09/15  Beginning of progress report period: February 17, 2022 End of progress report period: February 27, 2022  Today's Date: 02/27/2022 PT Individual Time: 1302-1359 PT Individual Time Calculation (min): 57 min   Patient has met 4 of 5 short term goals.  Pt's Pt is making slow progress towards LTG of mod assist overall for tranfers, max assist gait and supervision assist WC propulsion. husband has been present for in room thery but has not performed hands on education. Pt limited mainly by midline disorientation poor attention and dense L sided hemiplgia  Patient continues to demonstrate the following deficits muscle weakness, muscle joint tightness, and muscle paralysis, decreased cardiorespiratoy endurance, impaired timing and sequencing, abnormal tone, unbalanced muscle activation, motor apraxia, decreased coordination, and decreased motor planning, decreased visual perceptual skills and field cut, decreased midline orientation, decreased attention to left, and left side neglect, decreased initiation, decreased attention, decreased awareness, decreased problem solving, decreased safety awareness, decreased memory, and delayed processing, and decreased sitting balance, decreased standing balance, decreased postural control, hemiplegia, and decreased balance strategies and therefore will continue to benefit from skilled PT intervention to increase functional independence with mobility.   Patient progressing toward long term goals..  Continue plan of care.  PT Short Term Goals Week 1:  PT Short Term Goal 1 (Week 1): Pt will sit EOB with mod Assist up to 3 minteus PT Short Term Goal 1 - Progress (Week 1): Met PT Short Term Goal 2 (Week 1): Pt will initiate gait training PT Short Term Goal 2 - Progress (Week 1): Met PT Short Term Goal 3 (Week 1): Pt will trasnfer to [facility] with max assist of  1 PT Short Term Goal 3 - Progress (Week 1): Met PT Short Term Goal 4 (Week 1): Pt will perform sit<>stand with max assist of 1` PT Short Term Goal 4 - Progress (Week 1): Met PT Short Term Goal 5 (Week 1): Pt will perform FIST assessment PT Short Term Goal 5 - Progress (Week 1): Not met Week 2:  PT Short Term Goal 1 (Week 2): Pt will sit min assist up to 5 min with 1 UE supported on bed PT Short Term Goal 2 (Week 2): Pt will perform sit<>stand trasnfer with mod assist PT Short Term Goal 3 (Week 2): Pt will ambulate 39f with max assist +2 for safety at rail in hall PT Short Term Goal 4 (Week 2): [facility]  Skilled Therapeutic Interventions/Progress Updates:  Ambulation/gait training;Discharge planning;Functional mobility training;Psychosocial support;Therapeutic Activities;Visual/perceptual remediation/compensation;Balance/vestibular training;Disease management/prevention;Neuromuscular re-education;Skin care/wound management;Therapeutic Exercise;Wheelchair propulsion/positioning;Cognitive remediation/compensation;DME/adaptive equipment instruction;Pain management;UE/LE Strength taining/ROM;Splinting/orthotics;Community reintegration;Functional electrical stimulation;Patient/family education;Stair training;UE/LE Coordination activities    PT treatment.  Pt received supine in bed and agreeable to PT. Supine>sit transfer with max assist and cues for bil LE positioning for in lob rol as well as improved trunk rotation to the R to prevent posterior LOB with transition to sitting.   Sitting balance EOB to don shoes with min-mod assist from PT for safety . And prevent LOB to the L. Squat [facility] 1 with LLE blocked and cues for weight shift over the RLE to allow increased lift off bed.   [facility] PT donned givmorh sling to improve support and control of the LUE  [facility] and visual  feedback from mirror to improve midline x 6 max assist to prevent lateral lean to the L once in standing up to 45 sec. Gait training at rail in hall x 88f with max-total A and +2 follow from PT with DF wrap and max cues for use  Visual feedback from mirror to improve midline, increase knee extension on the LLE and weight shift to the R to prevent pushing to the LE  Pt returned to room and performed squat pivot transfer to bed with total A to the Rwith LLE blocked. Sit>supine completed with max assist from PT for LLE and LUE support, and left supine in bed with call bell in reach and all needs met.   Noted BM. Pt refused assistance from PT to perform hygiene. RN aware.    Therapy Documentation Precautions:  Precautions Precautions: Fall Precaution Comments: L hemiparesis, L inattention Restrictions Weight Bearing Restrictions: No  Vital Signs: Therapy Vitals Temp: 97.9 F (36.6 C) Temp Source: Oral Pulse Rate: 74 Resp: 16 BP: (!) 148/63 Patient Position (if appropriate): Lying Oxygen Therapy SpO2: 100 % O2 Device: Room Air Pain: denies  Therapy/Group: Individual Therapy  ALorie Phenix10/21/2023, 5:40 PM

## 2022-02-27 NOTE — Plan of Care (Signed)
  Problem: Education: Goal: Understanding of CV disease, CV risk reduction, and recovery process will improve Outcome: Progressing Goal: Individualized Educational Video(s) Outcome: Progressing   Problem: Activity: Goal: Ability to return to baseline activity level will improve Outcome: Progressing   Problem: Cardiovascular: Goal: Ability to achieve and maintain adequate cardiovascular perfusion will improve Outcome: Progressing Goal: Vascular access site(s) Level 0-1 will be maintained Outcome: Progressing   Problem: Health Behavior/Discharge Planning: Goal: Ability to safely manage health-related needs after discharge will improve Outcome: Progressing   Problem: Consults Goal: RH STROKE PATIENT EDUCATION Description: See Patient Education module for education specifics  Outcome: Progressing   Problem: RH BOWEL ELIMINATION Goal: RH STG MANAGE BOWEL WITH ASSISTANCE Description: STG Manage Bowel with Assistance. Outcome: Progressing Goal: RH STG MANAGE BOWEL W/MEDICATION W/ASSISTANCE Description: STG Manage Bowel with Medication with Assistance. Outcome: Progressing   Problem: RH BLADDER ELIMINATION Goal: RH STG MANAGE BLADDER WITH ASSISTANCE Description: STG Manage Bladder With Assistance Outcome: Progressing Goal: RH STG MANAGE BLADDER WITH MEDICATION WITH ASSISTANCE Description: STG Manage Bladder With Medication With Assistance. Outcome: Progressing   Problem: RH SAFETY Goal: RH STG ADHERE TO SAFETY PRECAUTIONS W/ASSISTANCE/DEVICE Description: STG Adhere to Safety Precautions With Assistance/Device. Outcome: Progressing   Problem: RH KNOWLEDGE DEFICIT Goal: RH STG INCREASE KNOWLEDGE OF HYPERTENSION Outcome: Progressing Goal: RH STG INCREASE KNOWLEDGE OF DYSPHAGIA/FLUID INTAKE Outcome: Progressing Goal: RH STG INCREASE KNOWLEGDE OF HYPERLIPIDEMIA Outcome: Progressing Goal: RH STG INCREASE KNOWLEDGE OF STROKE PROPHYLAXIS Outcome: Progressing

## 2022-02-28 LAB — GLUCOSE, CAPILLARY
Glucose-Capillary: 119 mg/dL — ABNORMAL HIGH (ref 70–99)
Glucose-Capillary: 111 mg/dL — ABNORMAL HIGH (ref 70–99)
Glucose-Capillary: 93 mg/dL (ref 70–99)
Glucose-Capillary: 123 mg/dL — ABNORMAL HIGH (ref 70–99)

## 2022-02-28 NOTE — Progress Notes (Signed)
Speech Language Pathology Daily Session Note  Patient Details  Name: Julia Deleon MRN: [id number] Date of Birth: 12/16/1945  Today's Date: 02/28/2022 SLP Individual Time: 1300-1400 SLP Individual Time Calculation (min): 60 min  Short Term Goals: Week 2: SLP Short Term Goal 1 (Week 2): STG=LTG due to ELOS  Skilled Therapeutic Interventions:  Pt was seen for skilled ST targeting goals for dysphagia and cognition.  Upon arrival, pt was seated in wheelchair, eating lunch, with husband at bedside.  Pt reported that she was finished with lunch; therefore, SLP encouraged pt to brush her teeth at the sink so that she could trials sips of water per the water protocol during this afternoon's treatment.  Pt needed mod-max cues to cease brushing and rinsing her teeth due to perseveration.  Pt requested to get out of the room for treatment session and was transported to SLP office.  Pt consumed sips of water with a delayed throat clear noted every 3-5 sips.  Recommend that pt remain on her currently prescribed diet with sips of water in between meals until repeat MBS is completed.  SLP also facilitated the session with a novel card game to address visual scanning and attention to task.  Pt needed overall mod-max assist multimodal cues to increase awareness of errors, locate cards to the left of midline, and for redirection in order to complete game.  Pt was transported back to room and left with husband at bedside.  Continue per current plan of care.    Pain Pain Assessment Pain Scale: 0-10 Pain Score: 0-No pain   Therapy/Group: Individual Therapy  Kei Langhorst, Selinda Orion 02/28/2022, 4:18 PM

## 2022-02-28 NOTE — Plan of Care (Signed)
  Problem: Education: Goal: Understanding of CV disease, CV risk reduction, and recovery process will improve Outcome: Progressing Goal: Individualized Educational Video(s) Outcome: Progressing   Problem: Activity: Goal: Ability to return to baseline activity level will improve Outcome: Progressing   Problem: Cardiovascular: Goal: Ability to achieve and maintain adequate cardiovascular perfusion will improve Outcome: Progressing Goal: Vascular access site(s) Level 0-1 will be maintained Outcome: Progressing   Problem: Health Behavior/Discharge Planning: Goal: Ability to safely manage health-related needs after discharge will improve Outcome: Progressing   Problem: Consults Goal: RH STROKE PATIENT EDUCATION Description: See Patient Education module for education specifics  Outcome: Progressing   Problem: RH BOWEL ELIMINATION Goal: RH STG MANAGE BOWEL WITH ASSISTANCE Description: STG Manage Bowel with Assistance. Outcome: Progressing Goal: RH STG MANAGE BOWEL W/MEDICATION W/ASSISTANCE Description: STG Manage Bowel with Medication with Assistance. Outcome: Progressing   Problem: RH BLADDER ELIMINATION Goal: RH STG MANAGE BLADDER WITH ASSISTANCE Description: STG Manage Bladder With Assistance Outcome: Progressing Goal: RH STG MANAGE BLADDER WITH MEDICATION WITH ASSISTANCE Description: STG Manage Bladder With Medication With Assistance. Outcome: Progressing   Problem: RH SAFETY Goal: RH STG ADHERE TO SAFETY PRECAUTIONS W/ASSISTANCE/DEVICE Description: STG Adhere to Safety Precautions With Assistance/Device. Outcome: Progressing   Problem: RH KNOWLEDGE DEFICIT Goal: RH STG INCREASE KNOWLEDGE OF HYPERTENSION Outcome: Progressing Goal: RH STG INCREASE KNOWLEDGE OF DYSPHAGIA/FLUID INTAKE Outcome: Progressing Goal: RH STG INCREASE KNOWLEGDE OF HYPERLIPIDEMIA Outcome: Progressing Goal: RH STG INCREASE KNOWLEDGE OF STROKE PROPHYLAXIS Outcome: Progressing

## 2022-02-28 NOTE — Progress Notes (Signed)
PROGRESS NOTE   Subjective/Complaints:  Pt reports tired- "resting her eyes"- appears asleep yesterday and this AM when entered-   Denies any issues   ROS- denies pain; otherwise limited by cognition Objective:   No results found. No results for input(s): "WBC", "HGB", "HCT", "PLT" in the last 72 hours.  No results for input(s): "NA", "K", "CL", "CO2", "GLUCOSE", "BUN", "CREATININE", "CALCIUM" in the last 72 hours.   Intake/Output Summary (Last 24 hours) at 02/28/2022 1318 Last data filed at 02/28/2022 0725 Gross per 24 hour  Intake 236 ml  Output 600 ml  Net -364 ml        Physical Exam: Vital Signs Blood pressure (!) 155/56, pulse 75, temperature 97.6 F (36.4 C), temperature source Oral, resp. rate 17, weight 70.2 kg, SpO2 97 %.    General: awake, alert, but eyes closed, NAD HENT: oropharynx dry- getting TF's via PEG CV: regular rate; no JVD Pulmonary: CTA B/L; no W/R/R- good air movement GI: soft, NT, ND, (+)BS Psychiatric: very flat; dearth of speech Neurological: eyes closed most of visit- did respond with social niceties only Extremities: No clubbing, cyanosis, or edema Skin: No evidence of breakdown, no evidence of rash, left ant delt ~1cm subcut cyst, not erythematous, non tender    Neurologic: Cranial nerves II through XII intact, motor strength is 5/5 in right and 0/5 deltoid, 2- horz add, 2- bicep, tricep, grip, 2-/5 hip flexor, knee extensors, 0/5 ankle dorsiflexor and plantar flexor   Musculoskeletal: mild pain with head rotation     Assessment/Plan: 1. Functional deficits which require 3+ hours per day of interdisciplinary therapy in a comprehensive inpatient rehab setting. Physiatrist is providing close team supervision and 24 hour management of active medical problems listed below. Physiatrist and rehab team continue to assess barriers to discharge/monitor patient progress toward  functional and medical goals  Care Tool:  Bathing    Body parts bathed by patient: Face, Abdomen, Front perineal area, Chest, Left arm, Left upper leg, Right upper leg   Body parts bathed by helper: Right arm, Left arm, Left lower leg, Buttocks, Right lower leg     Bathing assist Assist Level: Maximal Assistance - Patient 24 - 49%     Upper Body Dressing/Undressing Upper body dressing   What is the patient wearing?: Pull over shirt    Upper body assist Assist Level: Moderate Assistance - Patient 50 - 74%    Lower Body Dressing/Undressing Lower body dressing      What is the patient wearing?: Pants     Lower body assist Assist for lower body dressing: Maximal Assistance - Patient 25 - 49%     Toileting Toileting    Toileting assist Assist for toileting: Dependent - Patient 0%     Transfers Chair/bed transfer  Transfers assist  Chair/bed transfer activity did not occur: Safety/medical concerns  Chair/bed transfer assist level: Maximal Assistance - Patient 25 - 49%     Locomotion Ambulation   Ambulation assist   Ambulation activity did not occur: Safety/medical concerns      Max distance: Tilt in space wc   Walk 10 feet activity   Assist  Walk 10 feet activity did  not occur: Safety/medical concerns        Walk 50 feet activity   Assist Walk 50 feet with 2 turns activity did not occur: Safety/medical concerns         Walk 150 feet activity   Assist Walk 150 feet activity did not occur: Safety/medical concerns         Walk 10 feet on uneven surface  activity   Assist Walk 10 feet on uneven surfaces activity did not occur: Safety/medical concerns         Wheelchair     Assist Is the patient using a wheelchair?: Yes Type of Wheelchair: Manual    Wheelchair assist level: Dependent - Patient 0% Max wheelchair distance: 131f in TIS    Wheelchair 50 feet with 2 turns activity    Assist        Assist Level:  Dependent - Patient 0%   Wheelchair 150 feet activity     Assist      Assist Level: Dependent - Patient 0%   Blood pressure (!) 155/56, pulse 75, temperature 97.6 F (36.4 C), temperature source Oral, resp. rate 17, weight 70.2 kg, SpO2 97 %.  Medical Problem List and Plan: 1. Functional deficits secondary to   Right MCA infarct s/p TNK and IR with TICI2c, R ICA stent with left hemiplegia             -patient may  shower             -ELOS/Goals: 12-14 days             -plan is for SNF post d/c              -PRAFO ordered            dc amantadine, no issues with arousal , may be causing side  effects   Con't CIR- PT, and OT and SLP 2.  Antithrombotics: -DVT/anticoagulation:  Pharmaceutical: Lovenox             -antiplatelet therapy: ASA/Brilinta.  3. Headache: scheduled tylenol '650mg'$  QID. Also on topirimate '100mg'$  BID- pt states she's had this since CVA  --Will add aquatherapy for local measures.  Also has post CVA left shoulder pain ,cont PT, OT, may benefit from FES to Left shoulder  4. Mood/Behavior/Sleep: LCSW to follow for evaluation and support.              -antipsychotic agents: N/A 5. Neuropsych/cognition: This patient may not be fully capable of making decisions on her own behalf. 6. Skin/Wound Care: Routine pressure relief measures, small ant delt sub cut cyst, does not appear inflammed likely chronic 7. Fluids/Electrolytes/Nutrition: Monitor I/O. Poor intake should improve if fluids can be upgraded to thins   10/22- dysphagia- still on TF"s- due to D3 nectart hick and poor intake 8. CAS s/p stent: On ASA/Brilinta 9. HTN: Monitor BP TID--BP fluctuates, continue Losartan '25mg'$ . 10/16: BP up to 189/52 cont amlodipine '5mg'$  Vitals:   02/28/22 0850 02/28/22 0851  BP: (!) 155/66 (!) 155/56  Pulse: 75   Resp:    Temp:    SpO2:    BP improved 10/19  10/22- BP running 1315V 1761Ysystolic- had been doing [id number] has just been for 24 hours- will con't to monitor for  trend 10. Leucocytosis/UTI: Treated with rocephin. 11. AKI: Improved with IVF on board-->d/c and encourage fluid intake.     Latest Ref Rng & Units 02/22/2022    5:50 AM 02/17/2022    8:00  AM 02/16/2022    4:00 AM  BMP  Glucose 70 - 99 mg/dL 107  109  114   BUN 8 - 23 mg/dL '20  24  25   '$ Creatinine 0.44 - 1.00 mg/dL 1.05  1.09  1.00   Sodium 135 - 145 mmol/L 137  138  135   Potassium 3.5 - 5.1 mmol/L 3.5  3.7  3.7   Chloride 98 - 111 mmol/L 107  110  108   CO2 22 - 32 mmol/L '19  19  17   '$ Calcium 8.9 - 10.3 mg/dL 9.7  9.0  9.0   If BUN climbs above 30 will restart nocturnal IVF  10/22- labs in AM 12. Dysphagia: Continue D3, honey thick liquids. Will d/c IVF and monitor hydration status.             --encourage water protocol, as d/w  SLP repeat MBS next week   10/22- on D3 nectar thick liquids 13. Urinary retention will cont flomax, I/O caths, UC + enterococcus, f/u sensitivities, discussed with pharmacy, 1 dose fosfomycin recommended received on 10/15, will trial low dose urecholine 14. Headache             -Continue Topamax and  d/c Fioricet  only 1 dose taken in 3 d 15. HLD             -Continue crestor 16. COPD             -Albuterol PRN 17. Hypothyroidism             -Continue synthroid 18. Low back pain: encouraged use of heating pad.   19. Sore throat and myalgias- Covid neg x 2 , body aches may be related to chronic pain and immobility , add chloracept prn for sore throat 20.  Incont of bowel and bladder poor awareness and mobility issues contributing    LOS: 12 days A FACE TO FACE EVALUATION WAS PERFORMED  Mordche Hedglin 02/28/2022, 1:18 PM

## 2022-02-28 NOTE — Progress Notes (Signed)
Physical Therapy Session Note  Patient Details  Name: Julia Deleon MRN: [id number] Date of Birth: [birth year]  Today's Date: 02/28/2022 PT Individual Time: 1015-1100 PT Individual Time Calculation (min): 45 min   Short Term Goals: Week 2:  PT Short Term Goal 1 (Week 2): Pt will sit min assist up to 5 min with 1 UE supported on bed PT Short Term Goal 2 (Week 2): Pt will perform sit<>stand trasnfer with mod assist PT Short Term Goal 3 (Week 2): Pt will ambulate 65f with max assist +2 for safety at rail in hall PT Short Term Goal 4 (Week 2): [facility]  Skilled Therapeutic Interventions/Progress Updates:    pt received in bed and agreeable to therapy. Pt reports 6/10 back pain, premedicated. Rest and positioning provided as needed. Donned ted hose and shoes tot A, donned pants with max A pt able to roll with mod A to assist. Supine>sit with  cueing and bed features, mod A. Pt performed scooting with mod and Cueing for technique before performing heavy mod A squat pivot transfer to TIS to L with +2 standing by for safety. Pt transported to therapy gym for time management and energy conservation. Donned givmohr sling for LUE support prior to standing trials. Pt performed Sit to stand with max A for LLE support and RUE support on hand rail x 3. Added visual feed back after 1st attempt. Pt unable to achieve upright posture without assist on this date. Pt returned to room and remained in TIS with half lap tray in place and was left with all needs in reach and alarm active.   Therapy Documentation Precautions:  Precautions Precautions: Fall Precaution Comments: L hemiparesis, L inattention Restrictions Weight Bearing Restrictions: No General:       Therapy/Group: Individual Therapy  OMickel Fuchs10/22/2023, 12:16 PM

## 2022-03-01 LAB — GLUCOSE, CAPILLARY
Glucose-Capillary: 98 mg/dL (ref 70–99)
Glucose-Capillary: 113 mg/dL — ABNORMAL HIGH (ref 70–99)
Glucose-Capillary: 110 mg/dL — ABNORMAL HIGH (ref 70–99)
Glucose-Capillary: 181 mg/dL — ABNORMAL HIGH (ref 70–99)

## 2022-03-01 LAB — BASIC METABOLIC PANEL
Anion gap: 8 (ref 5–15)
BUN: 24 mg/dL — ABNORMAL HIGH (ref 8–23)
CO2: 18 mmol/L — ABNORMAL LOW (ref 22–32)
Calcium: 9.1 mg/dL (ref 8.9–10.3)
Chloride: 114 mmol/L — ABNORMAL HIGH (ref 98–111)
Creatinine, Ser: 0.97 mg/dL (ref 0.44–1.00)
GFR, Estimated: >60 mL/min (ref >60)
Glucose, Bld: 106 mg/dL — ABNORMAL HIGH (ref 70–99)
Potassium: 3.5 mmol/L (ref 3.5–5.1)
Sodium: 140 mmol/L (ref 135–145)

## 2022-03-01 LAB — CBC
HCT: 35.3 % — ABNORMAL LOW (ref 36.0–46.0)
Hemoglobin: 12 g/dL (ref 12.0–15.0)
MCH: 32.3 pg (ref 26.0–34.0)
MCHC: 34 g/dL (ref 30.0–36.0)
MCV: 95.1 fL (ref 80.0–100.0)
Platelets: 161 10*3/uL (ref 150–400)
RBC: 3.71 MIL/uL — ABNORMAL LOW (ref 3.87–5.11)
RDW: 14.8 % (ref 11.5–15.5)
WBC: 7 10*3/uL (ref 4.0–10.5)
nRBC: 0 % (ref 0.0–0.2)

## 2022-03-01 NOTE — Progress Notes (Signed)
Physical Therapy Session Note  Patient Details  Name: Julia Deleon MRN: [id number] Date of Birth: Jul 14, 1945  Today's Date: 03/01/2022 PT Individual Time: 0920-1003 PT Individual Time Calculation (min): 43 min   Short Term Goals: Week 2:  PT Short Term Goal 1 (Week 2): Pt will sit min assist up to 5 min with 1 UE supported on bed PT Short Term Goal 2 (Week 2): Pt will perform sit<>stand trasnfer with mod assist PT Short Term Goal 3 (Week 2): Pt will ambulate 58f with max assist +2 for safety at rail in hall PT Short Term Goal 4 (Week 2): [facility] Week 3:     Skilled Therapeutic Interventions/Progress Updates:  Patient supine in bed on entrance to room. Patient alert and agreeable to PT session.   Patient with no pain complaint at start of session. With poor positioning during bed mobility, pt relates pain and is alleviated with repositioning.   Therapeutic Activity: Bed Mobility: Pt performed supine --> sit with Mod/ MaxA d/t inability to push with RUE from sidelying. VC/ tc required for sequencing and technique throughout. While seated EOB, pt guided in dressing for day and requires MaxA for LB dressing, then MShellmanwith heavy cues to complete donning shirt. Bra requires TotA. Minimal LOB to L side with lateral weight shifts during dressing activity. Continues to require at least vc to correct.  Transfers: Pt performed sit<>stand and stand pivot transfers during session with Mod/ MaxA to guard L knee and provide movement of LE. Slide board transfer to L side from bed --> w/c with pt able to initiate each effort and MinA to complete scoot. MaxA for LLE repositioning and for board placement. Provided verbal cues for technique throughout.  Neuromuscular Re-ed: NMR facilitated during session with focus on sitting balance. Pt guided in attainment and reattainment of midline positioning while seated EOB. LOB noted to L with lift of RLE. Improved pushing with R hand  placement on R knee this day. Continuous vc required for pt to self -correct LOB despite recognizing that she is "falling over". Pt encouraged to fix anytime she notices.  NMR performed for improvements in motor control and coordination, balance, sequencing, judgement, and self confidence/ efficacy in performing all aspects of mobility at highest level of independence.   Patient seated upright in TIS w/c at end of session with brakes locked, belt alarm set, and all needs within reach. Husband arriving and in room.    Therapy Documentation Precautions:  Precautions Precautions: Fall Precaution Comments: L hemiparesis, L inattention Restrictions Weight Bearing Restrictions: No Pain:  Pain related in R wrist during bed mobility d/t slipping from lap with poor positioning. Alleviated with repositioning.   Therapy/Group: Individual Therapy  JAlger SimonsPT, DPT, CSRS 03/01/2022, 12:57 PM

## 2022-03-01 NOTE — Progress Notes (Signed)
Speech Language Pathology Daily Session Note  Patient Details  Name: Julia Deleon MRN: [id number] Date of Birth: [birth year]  Today's Date: 03/01/2022 SLP Individual Time: 0800-0900 SLP Individual Time Calculation (min): 60 min  Short Term Goals: Week 2: SLP Short Term Goal 1 (Week 2): STG=LTG due to ELOS  Skilled Therapeutic Interventions: Skilled ST treatment focused on swallowing and cognitive goals. Pt was accompanied by nurse on arrival who stated pt reportedly asked for her breakfast tray and requested to get dressed for the day around 0300. Pt perseverated on "running behind" this morning despite morning proceeding as planned/scheduled. SLP reinforced therapy schedule, typical time for breakfast and morning meds. Pt was observed consuming nectar thick liquids without overt s/s of aspiration and clear vocal quality post swallows.   Pt performed oral care with set-up A and moderate verbal cueing to conclude task due to perseveration. SLP facilitated trials of thin water. Pt exhibited immediate and delayed throat clear during 75% of occasions. No coughing events observed and vocal quality remained clear following throat clearing. SLP recommends pt continue with current diet. Continue water protocol between meals following oral care. MBS is scheduled for tomorrow (03/02/22) to further evaluate oropharyngeal swallow function. Pt verbalized agreement with plan.   SLP facilitated visual scanning/cancellation task by crossing out specific letters in paragraph form. During trial 1 pt achieved 47% accuracy independent of cues, progressing to 90% accuracy given max A verbal cues for scanning techniques; trial 2 pt achieved 21% accuracy independent of cues, progressing to 78% accuracy with max A verbal cues; trial 3 pt achieved 42% accuracy independent of cues, progressing to 96% accuracy given mod A verbal cues. Pt required verbal redirection cues which were necessary for slowing down, not moving on to the  next task, and intentionally reviewing work for completion/thoroughness/errors. Pt demonstrated impulsivity with task completion with reduced ability to alternate attention between SLP and task at hand.   Patient was left in bed with alarm activated and immediate needs within reach at end of session. Continue per current plan of care.       Pain  None/denied  Therapy/Group: Individual Therapy  Patty Sermons 03/01/2022, 8:12 AM

## 2022-03-01 NOTE — Progress Notes (Signed)
Occupational Therapy Session Note  Patient Details  Name: Julia Deleon MRN: [id number] Date of Birth: March 18, 1946  Today's Date: 03/01/2022 OT Individual Time: 8185-6314 OT Individual Time Calculation (min): 60 min (1110-1200 unattended estim = 50 min)   Short Term Goals: Week 2:  OT Short Term Goal 1 (Week 2): Pt will be able to rise to stand with mod A and then lift up into full extension to enable caregiver to assist with clothing management. OT Short Term Goal 2 (Week 2): pt will be able to maintain sit balance on BSC for 5 minutes without leaning to her L. OT Short Term Goal 3 (Week 2): Pt will be able to bathe with mod A.     Skilled Therapeutic Interventions/Progress Updates:    Pt received in wc ready for therapy. Pt's spouse present at start of session. Updated him on her progress with her L visual scanning, postural control and attention.  Pt then taken to gym to address LUE.  To date, no active movement has been observed but today when LUE place on towel on table and pt instructed to slide arm across table she was able to do so. She had active horizontal adduction (gravity eliminated) with slight elbow flexion but no abduction of shoulder.  Pt stated "I have been able to do this for awhile now" but therapy has not observed.  Worked on a/arom of shoulder.  She continues to have shoulder pain so limited the movements to ROM with arm supported by table.   Applied estim with Empi Estim unit using small muscle atrophy setting to L forearm to stimulate finger and wrist extensors.  Pt tolerated a stim level of 24 to achieve 40-50% of motion for 15 minutes. Cued pt to focus on her L hand and imagine she was moving her hand but needed frequent cues due to distraction.  Therapist did hand over hand guiding to facilitate grasp.  Once that had completed, removed Kinesiotape from shoulder.  Slight redness to skin.  Applied saebo stim one unit to L deltoids to facilitate shoulder approximation.  Pt  tolerated well.  Left on pt's shoulder for 50 min for cyclic unattended estim.  No discomfort or redness to skin upon removal 50 min later. Saebo Stim One 330 pulse width 35 Hz pulse rate On 8 sec/ off 8 sec Ramp up/ down 2 sec Symmetrical Biphasic wave form  Max intensity 112m at 500 Ohm load   Pt in wc with belt alarm on and all needs met.    Therapy Documentation Precautions:  Precautions Precautions: Fall Precaution Comments: L hemiparesis, L inattention Restrictions Weight Bearing Restrictions: No     Pain: c/o low back pain - applied kpad at end of session to mid back     ADL: ADL Eating: Minimal assistance, Moderate cueing Where Assessed-Eating: Wheelchair Grooming: Minimal assistance Where Assessed-Grooming: Wheelchair, Sitting at sink Upper Body Bathing: Moderate assistance Where Assessed-Upper Body Bathing: Shower, Other (Comment) (Tilit in space shower chair) Lower Body Bathing: Maximal assistance Where Assessed-Lower Body Bathing: Shower (Tilt in space shower chair) Upper Body Dressing: Minimal assistance Where Assessed-Upper Body Dressing: Wheelchair Lower Body Dressing: Maximal assistance Where Assessed-Lower Body Dressing: Wheelchair Toileting: Maximal assistance Where Assessed-Toileting: Other (Comment) (Elevated toilet seat) Toilet Transfer: Maximal assistance Toilet Transfer Method: SEngineer, water Raised toilet seat WSocial research officer, government Maximal assistance WSocial research officer, governmentMethod: SRadiographer, therapeutic Other (comment) (Tilt in space shower chair)     Therapy/Group: Individual Therapy  [place] 03/01/2022, 8:24 AM

## 2022-03-01 NOTE — Progress Notes (Signed)
Physical Therapy Session Note  Patient Details  Name: Julia Deleon MRN: [id number] Date of Birth: May 26, 1945  Today's Date: 03/01/2022 PT Individual Time: 1450-1601 PT Individual Time Calculation (min): 71 min   Short Term Goals: Week 1:  PT Short Term Goal 1 (Week 1): Pt will sit EOB with mod Assist up to 3 minteus PT Short Term Goal 1 - Progress (Week 1): Met PT Short Term Goal 2 (Week 1): Pt will initiate gait training PT Short Term Goal 2 - Progress (Week 1): Met PT Short Term Goal 3 (Week 1): Pt will trasnfer to [facility] with max assist of 1 PT Short Term Goal 3 - Progress (Week 1): Met PT Short Term Goal 4 (Week 1): Pt will perform sit<>stand with max assist of 1` PT Short Term Goal 4 - Progress (Week 1): Met PT Short Term Goal 5 (Week 1): Pt will perform FIST assessment PT Short Term Goal 5 - Progress (Week 1): Not met Week 2:  PT Short Term Goal 1 (Week 2): Pt will sit min assist up to 5 min with 1 UE supported on bed PT Short Term Goal 2 (Week 2): Pt will perform sit<>stand trasnfer with mod assist PT Short Term Goal 3 (Week 2): Pt will ambulate 38ft with max assist +2 for safety at rail in hall PT Short Term Goal 4 (Week 2): Pt will transfer to [facility] with mod assist Week 3:     Skilled Therapeutic Interventions/Progress Updates:  Patient supine in bed on entrance to room. Patient alert and agreeable to PT session.   Patient with mild pain complaint at L shoulder and L knee at start of session.  Therapeutic Activity: Bed Mobility: Pt performed supine --> sit with extensive cueing for technique and sequencing. Completes with MinA to seated position on EOB. Min/ Mod A to progress L hip when scooting forward. At end of session pt performs return to supine with vc for UB and ModA for L foot to reach bed surface. Other wise pt able to follow instructions and complete with MinA.  Transfers: Pt performed squat pivot transfer to L side from bed to w/c with Mod A for positioning LLE and for  appropriate clearance to w/c seat. Sit<>stand transfers performed in // bars with focus on NMR, motor control, muscle activation, and completed with Mod/ MaxA for first 2 transfers and then [facility] for final rise to stand. Squat pivot transfer back to bed from w/c to pt's R side with ModA for appropriate lift to reach bed surface and then attempts on own with short distances reached until Min/ ModA provided for appropriate lift.   Neuromuscular Re-ed: NMR facilitated during session with focus on standing balance, postural control, motor control, muscle activation. Pt guided in upright posture attainment in // bars. With vc pt makes an effort and then returns to flexed posture requiring consistent vc/ tc. Guided in forward scoot, lean and then rise to stand with block to L knee, facilitation at glutes and anterior shoulder for improved upright positioning. Pt is able to maintain >24min on first two bouts and then <30 sec on final bout d/t knee pain.   Pt guided in seated marches, LAQs and toe raises AROM with RLE and AAROM for LLE. With alternating marches, pt is able to demo self activation and lift of LE form ground ~80% of attempts. Minimal activation noted with at quads with LAQs and with toe raises. AAROM initiated with slightest self-initiation. Noted subconscious L foot PF with attempt  to assist in raising L knee.   NMR performed for improvements in motor control and coordination, balance, sequencing, judgement, and self confidence/ efficacy in performing all aspects of mobility at highest level of independence.   Patient supine in bed at end of session with brakes locked, bed alarm set, pillow prop for LUE and all needs within reach.   Therapy Documentation Precautions:  Precautions Precautions: Fall Precaution Comments: L hemiparesis, L inattention Restrictions Weight Bearing Restrictions: No General:   Pain: Pain Assessment Pain Scale: 0-10 Pain Score: 0-No pain  Therapy/Group:  Individual Therapy  Alger Simons PT, DPT, CSRS 03/01/2022, 5:17 PM

## 2022-03-01 NOTE — Progress Notes (Signed)
PROGRESS NOTE   Subjective/Complaints:  No new issues, working with SLP , repeat MBS planned for am, hope to d/c the nectar liquids  ROS- denies pain; otherwise limited by cognition Objective:   No results found. Recent Labs    03/01/22 0512  WBC 7.0  HGB 12.0  HCT 35.3*  PLT 161    Recent Labs    03/01/22 0512  NA 140  K 3.5  CL 114*  CO2 18*  GLUCOSE 106*  BUN 24*  CREATININE 0.97  CALCIUM 9.1     Intake/Output Summary (Last 24 hours) at 03/01/2022 0827 Last data filed at 03/01/2022 0813 Gross per 24 hour  Intake 118 ml  Output 600 ml  Net -482 ml         Physical Exam: Vital Signs Blood pressure (!) 138/57, pulse 75, temperature 97.8 F (36.6 C), temperature source Oral, resp. rate 18, weight 66.3 kg, SpO2 98 %.   General: No acute distress Mood and affect are appropriate Heart: Regular rate and rhythm no rubs murmurs or extra sounds Lungs: Clear to auscultation, breathing unlabored, no rales or wheezes Abdomen: Positive bowel sounds, soft nontender to palpation, nondistended Extremities: No clubbing, cyanosis, or edema Skin: No evidence of breakdown, no evidence of rash   Neurologic: Cranial nerves II through XII intact, motor strength is 5/5 in right and 0/5 deltoid, 2- horz add, 2- bicep, tricep, grip, 2-/5 hip flexor, knee extensors, 0/5 ankle dorsiflexor and plantar flexor   Musculoskeletal: mild pain with head rotation     Assessment/Plan: 1. Functional deficits which require 3+ hours per day of interdisciplinary therapy in a comprehensive inpatient rehab setting. Physiatrist is providing close team supervision and 24 hour management of active medical problems listed below. Physiatrist and rehab team continue to assess barriers to discharge/monitor patient progress toward functional and medical goals  Care Tool:  Bathing    Body parts bathed by patient: Face, Abdomen, Front  perineal area, Chest, Left arm, Left upper leg, Right upper leg   Body parts bathed by helper: Right arm, Left arm, Left lower leg, Buttocks, Right lower leg     Bathing assist Assist Level: Maximal Assistance - Patient 24 - 49%     Upper Body Dressing/Undressing Upper body dressing   What is the patient wearing?: Pull over shirt    Upper body assist Assist Level: Moderate Assistance - Patient 50 - 74%    Lower Body Dressing/Undressing Lower body dressing      What is the patient wearing?: Pants     Lower body assist Assist for lower body dressing: Maximal Assistance - Patient 25 - 49%     Toileting Toileting    Toileting assist Assist for toileting: Dependent - Patient 0%     Transfers Chair/bed transfer  Transfers assist  Chair/bed transfer activity did not occur: Safety/medical concerns  Chair/bed transfer assist level: Maximal Assistance - Patient 25 - 49%     Locomotion Ambulation   Ambulation assist   Ambulation activity did not occur: Safety/medical concerns      Max distance: Tilt in space wc   Walk 10 feet activity   Assist  Walk 10 feet activity did  not occur: Safety/medical concerns        Walk 50 feet activity   Assist Walk 50 feet with 2 turns activity did not occur: Safety/medical concerns         Walk 150 feet activity   Assist Walk 150 feet activity did not occur: Safety/medical concerns         Walk 10 feet on uneven surface  activity   Assist Walk 10 feet on uneven surfaces activity did not occur: Safety/medical concerns         Wheelchair     Assist Is the patient using a wheelchair?: Yes Type of Wheelchair: Manual    Wheelchair assist level: Dependent - Patient 0% Max wheelchair distance: 140f in TIS    Wheelchair 50 feet with 2 turns activity    Assist        Assist Level: Dependent - Patient 0%   Wheelchair 150 feet activity     Assist      Assist Level: Dependent - Patient  0%   Blood pressure (!) 138/57, pulse 75, temperature 97.8 F (36.6 C), temperature source Oral, resp. rate 18, weight 66.3 kg, SpO2 98 %.  Medical Problem List and Plan: 1. Functional deficits secondary to   Right MCA infarct s/p TNK and IR with TICI2c, R ICA stent with left hemiplegia             -patient may  shower             -ELOS/Goals: 12-14 days             -plan is for SNF post d/c              -PRAFO ordered            dc amantadine, no issues with arousal , may be causing side  effects   Con't CIR- PT, and OT and SLP 2.  Antithrombotics: -DVT/anticoagulation:  Pharmaceutical: Lovenox             -antiplatelet therapy: ASA/Brilinta.  3. Headache: scheduled tylenol '650mg'$  QID. Also on topirimate '100mg'$  BID- pt states she's had this since CVA  --Will add aquatherapy for local measures.  Also has post CVA left shoulder pain ,cont PT, OT, may benefit from FES to Left shoulder  4. Mood/Behavior/Sleep: LCSW to follow for evaluation and support.              -antipsychotic agents: N/A 5. Neuropsych/cognition: This patient may not be fully capable of making decisions on her own behalf. 6. Skin/Wound Care: Routine pressure relief measures, small ant delt sub cut cyst, does not appear inflammed likely chronic 7. Fluids/Electrolytes/Nutrition: Monitor I/O. Poor intake should improve if fluids can be upgraded to thins   10/22- dysphagia- still on TF"s- due to D3 nectart hick and poor intake 8. CAS s/p stent: On ASA/Brilinta 9. HTN: Monitor BP TID--BP fluctuates, continue Losartan '25mg'$ . 10/16: BP up to 189/52 cont amlodipine '5mg'$  Vitals:   02/28/22 1910 03/01/22 0341  BP: (!) 151/53 (!) 138/57  Pulse: 80 75  Resp: 18 18  Temp: 97.9 F (36.6 C) 97.8 F (36.6 C)  SpO2: 91% 98%  Fair control 10/23   10. Leucocytosis/UTI: Treated with rocephin. 11. AKI: Improved with IVF on board-->d/c and encourage fluid intake.     Latest Ref Rng & Units 03/01/2022    5:12 AM 02/22/2022    5:50  AM 02/17/2022    8:00 AM  BMP  Glucose 70 - 99 mg/dL 106  107  109   BUN 8 - 23 mg/dL '24  20  24   '$ Creatinine 0.44 - 1.00 mg/dL 0.97  1.05  1.09   Sodium 135 - 145 mmol/L 140  137  138   Potassium 3.5 - 5.1 mmol/L 3.5  3.5  3.7   Chloride 98 - 111 mmol/L 114  107  110   CO2 22 - 32 mmol/L '18  19  19   '$ Calcium 8.9 - 10.3 mg/dL 9.1  9.7  9.0   If BUN climbs above 30 will restart nocturnal IVF  10/22- labs in AM 12. Dysphagia: Continue D3, honey thick liquids. Will d/c IVF and monitor hydration status.             --encourage water protocol,   10/23- on D3 nectar thick liquids- MBS in am  13. Urinary retention will cont flomax, I/O caths, UC + enterococcus, f/u sensitivities, discussed with pharmacy, 1 dose fosfomycin recommended received on 10/15, will trial low dose urecholine 14. Headache             -Continue Topamax and  d/c Fioricet  only 1 dose taken in 3 d 15. HLD             -Continue crestor 16. COPD             -Albuterol PRN 17. Hypothyroidism             -Continue synthroid 18. Low back pain: encouraged use of heating pad.     20.  Incont of bowel and bladder poor awareness and mobility issues contributing    LOS: 13 days A FACE TO FACE EVALUATION WAS PERFORMED  Charlett Blake 03/01/2022, 8:27 AM

## 2022-03-02 ENCOUNTER — Inpatient Hospital Stay (HOSPITAL_COMMUNITY): Payer: Medicare Other

## 2022-03-02 LAB — GLUCOSE, CAPILLARY
Glucose-Capillary: 114 mg/dL — ABNORMAL HIGH (ref 70–99)
Glucose-Capillary: 129 mg/dL — ABNORMAL HIGH (ref 70–99)
Glucose-Capillary: 100 mg/dL — ABNORMAL HIGH (ref 70–99)
Glucose-Capillary: 125 mg/dL — ABNORMAL HIGH (ref 70–99)

## 2022-03-02 NOTE — Progress Notes (Signed)
Patient ID: Julia Deleon, female   DOB: Jul 31, 1945, 76 y.o.   MRN: [id number]  Sw provided patient with bed offers in [place] area. Patient will review and follow up with SW in AM. No additional questions or concerns.

## 2022-03-02 NOTE — Plan of Care (Signed)
Goals downgraded due to slower than anticipated progress   Problem: RH Problem Solving Goal: LTG Patient will demonstrate problem solving for (SLP) Description: LTG:  Patient will demonstrate problem solving for basic/complex daily situations with cues  (SLP) Flowsheets (Taken 03/02/2022 0907) LTG Patient will demonstrate problem solving for: Moderate Assistance - Patient 50 - 74%   Problem: RH Memory Goal: LTG Patient will demonstrate ability for day to day (SLP) Description: LTG:   Patient will demonstrate ability for day to day recall/carryover during cognitive/linguistic activities with assist  (SLP) Flowsheets (Taken 03/02/2022 0907) LTG: Patient will demonstrate ability for day to day recall/carryover during cognitive/linguistic activities with assist (SLP): Moderate Assistance - Patient 50 - 74%   Problem: RH Awareness Goal: LTG: Patient will demonstrate awareness during functional activites type of (SLP) Description: LTG: Patient will demonstrate awareness during functional activites type of (SLP) Flowsheets (Taken 03/02/2022 0907) LTG: Patient will demonstrate awareness during cognitive/linguistic activities with assistance of (SLP): Moderate Assistance - Patient 50 - 74%

## 2022-03-02 NOTE — Progress Notes (Signed)
Physical Therapy Session Note  Patient Details  Name: Julia Deleon MRN: [id number] Date of Birth: [birth year]  Today's Date: 03/02/2022 PT Individual Time: 2550-0164 PT Individual Time Calculation (min): 41 min  and Today's Date: 03/02/2022 PT Missed Time: 34 Minutes Missed Time Reason: Patient fatigue;Pain;Patient unwilling to participate  Short Term Goals: Week 1:  PT Short Term Goal 1 (Week 1): Pt will sit EOB with mod Assist up to 3 minteus PT Short Term Goal 1 - Progress (Week 1): Met PT Short Term Goal 2 (Week 1): Pt will initiate gait training PT Short Term Goal 2 - Progress (Week 1): Met PT Short Term Goal 3 (Week 1): Pt will trasnfer to [facility] with max assist of 1 PT Short Term Goal 3 - Progress (Week 1): Met PT Short Term Goal 4 (Week 1): Pt will perform sit<>stand with max assist of 1` PT Short Term Goal 4 - Progress (Week 1): Met PT Short Term Goal 5 (Week 1): Pt will perform FIST assessment PT Short Term Goal 5 - Progress (Week 1): Not met Week 2:  PT Short Term Goal 1 (Week 2): Pt will sit min assist up to 5 min with 1 UE supported on bed PT Short Term Goal 2 (Week 2): Pt will perform sit<>stand trasnfer with mod assist PT Short Term Goal 3 (Week 2): Pt will ambulate 41f with max assist +2 for safety at rail in hall PT Short Term Goal 4 (Week 2): [facility]:     Skilled Therapeutic Interventions/Progress Updates:      Therapy Documentation Precautions:  Precautions Precautions: Fall Precaution Comments: L hemiparesis, L inattention Restrictions Weight Bearing Restrictions: No General: PT Amount of Missed Time (min): 34 Minutes PT Missed Treatment Reason: Patient fatigue;Pain;Patient unwilling to participate Vital Signs: Therapy Vitals Temp: (!) 97.5 F (36.4 C) Temp Source: Oral Pulse Rate: 83 Resp: 16 BP: (!) 150/63 Patient Position (if appropriate): Sitting Oxygen Therapy SpO2: 99 % O2 Device: Room Air Pain:      Therapy/Group: Individual Therapy  JAlger SimonsPT, DPT, CSRS 03/02/2022, 4:43 PM

## 2022-03-02 NOTE — Progress Notes (Signed)
Occupational Therapy Session Note  Patient Details  Name: Julia Deleon MRN: [id number] Date of Birth: 12/16/1945  Today's Date: 03/02/2022 OT Individual Time: 7353-2992 OT Individual Time Calculation (min): 49 min    Short Term Goals: Week 2:  OT Short Term Goal 1 (Week 2): Pt will be able to rise to stand with mod A and then lift up into full extension to enable caregiver to assist with clothing management. OT Short Term Goal 2 (Week 2): pt will be able to maintain sit balance on BSC for 5 minutes without leaning to her L. OT Short Term Goal 3 (Week 2): Pt will be able to bathe with mod A.  Skilled Therapeutic Interventions/Progress Updates:  Pt greeted supine in bed,pt agreeable to OT intervention. Session focus on BADL reeducation, functional mobility, dynamic sitting balance and decreasing overall caregiver burden.      Pt reports incontinent BM, pt preferred to roll for clean up d/t increased pain. Pt completed rollingR<>L with overall MINA. Pt needed total A for pericare d/t + BM. Once pt cleaned up, pt transitioned supine>sit with MIN A for LLE mgmt, pt able to complete static sitting EOB with CGA- MINA to don bra with total A. Pt then reports pain in back needing to return to supine with MODA. Pt then reports additional BM, pt required MIN A to roll again for pericare, offered to put pt on BSC or bed pan with pt declining all options stating" I dont think I can handle that today." NT enter to complete bladder scan during session, donned shirt from supine with MAX A for time mgmt. Since pt having active BMs pt left supine in bed with clean brief donned and all needs within reach.                            Therapy Documentation Precautions:  Precautions Precautions: Fall Precaution Comments: L hemiparesis, L inattention Restrictions Weight Bearing Restrictions: No  Pain: Unrated pain reproted in back, with pt needing to return to supine for pain mgmt   Therapy/Group: Individual  Therapy  [name] [facility] 03/02/2022, 12:08 PM

## 2022-03-02 NOTE — Progress Notes (Signed)
PROGRESS NOTE   Subjective/Complaints:  repeat MBS planned for this  am, hope to d/c the nectar liquids  ROS- denies pain; otherwise limited by cognition Objective:   No results found. Recent Labs    03/01/22 0512  WBC 7.0  HGB 12.0  HCT 35.3*  PLT 161     Recent Labs    03/01/22 0512  NA 140  K 3.5  CL 114*  CO2 18*  GLUCOSE 106*  BUN 24*  CREATININE 0.97  CALCIUM 9.1      Intake/Output Summary (Last 24 hours) at 03/02/2022 0734 Last data filed at 03/02/2022 0200 Gross per 24 hour  Intake 354 ml  Output 1450 ml  Net -1096 ml         Physical Exam: Vital Signs Blood pressure (!) 113/59, pulse 65, temperature 98.4 F (36.9 C), resp. rate 14, weight 66.4 kg, SpO2 100 %.   General: No acute distress Mood and affect are appropriate Heart: Regular rate and rhythm no rubs murmurs or extra sounds Lungs: Clear to auscultation, breathing unlabored, no rales or wheezes Abdomen: Positive bowel sounds, soft nontender to palpation, nondistended Extremities: No clubbing, cyanosis, or edema Skin: No evidence of breakdown, no evidence of rash   Neurologic: Cranial nerves II through XII intact, motor strength is 5/5 in right and 0/5 deltoid, 2- horz add, 2- bicep, tricep, grip, 2-/5 hip flexor, knee extensors, 0/5 ankle dorsiflexor and plantar flexor   Musculoskeletal: mild pain with head rotation     Assessment/Plan: 1. Functional deficits which require 3+ hours per day of interdisciplinary therapy in a comprehensive inpatient rehab setting. Physiatrist is providing close team supervision and 24 hour management of active medical problems listed below. Physiatrist and rehab team continue to assess barriers to discharge/monitor patient progress toward functional and medical goals  Care Tool:  Bathing    Body parts bathed by patient: Face, Abdomen, Front perineal area, Chest, Left arm, Left upper leg,  Right upper leg   Body parts bathed by helper: Right arm, Left arm, Left lower leg, Buttocks, Right lower leg     Bathing assist Assist Level: Maximal Assistance - Patient 24 - 49%     Upper Body Dressing/Undressing Upper body dressing   What is the patient wearing?: Pull over shirt    Upper body assist Assist Level: Moderate Assistance - Patient 50 - 74%    Lower Body Dressing/Undressing Lower body dressing      What is the patient wearing?: Pants     Lower body assist Assist for lower body dressing: Maximal Assistance - Patient 25 - 49%     Toileting Toileting    Toileting assist Assist for toileting: Dependent - Patient 0%     Transfers Chair/bed transfer  Transfers assist  Chair/bed transfer activity did not occur: Safety/medical concerns  Chair/bed transfer assist level: Maximal Assistance - Patient 25 - 49%     Locomotion Ambulation   Ambulation assist   Ambulation activity did not occur: Safety/medical concerns      Max distance: Tilt in space wc   Walk 10 feet activity   Assist  Walk 10 feet activity did not occur: Safety/medical concerns  Walk 50 feet activity   Assist Walk 50 feet with 2 turns activity did not occur: Safety/medical concerns         Walk 150 feet activity   Assist Walk 150 feet activity did not occur: Safety/medical concerns         Walk 10 feet on uneven surface  activity   Assist Walk 10 feet on uneven surfaces activity did not occur: Safety/medical concerns         Wheelchair     Assist Is the patient using a wheelchair?: Yes Type of Wheelchair: Manual    Wheelchair assist level: Dependent - Patient 0% Max wheelchair distance: 139f in TIS    Wheelchair 50 feet with 2 turns activity    Assist        Assist Level: Dependent - Patient 0%   Wheelchair 150 feet activity     Assist      Assist Level: Dependent - Patient 0%   Blood pressure (!) 113/59, pulse 65,  temperature 98.4 F (36.9 C), resp. rate 14, weight 66.4 kg, SpO2 100 %.  Medical Problem List and Plan: 1. Functional deficits secondary to   Right MCA infarct s/p TNK and IR with TICI2c, R ICA stent with left hemiplegia             -patient may  shower             -ELOS/Goals: 12-14 days             -plan is for SNF post d/c, team conf in am               -PRAFO ordered            dc amantadine, no issues with arousal , may be causing side  effects   Con't CIR- PT, and OT and SLP 2.  Antithrombotics: -DVT/anticoagulation:  Pharmaceutical: Lovenox             -antiplatelet therapy: ASA/Brilinta.  3. Headache: scheduled tylenol '650mg'$  QID. Also on topirimate '100mg'$  BID- improving  Also has post CVA left shoulder pain ,cont PT, OT, may benefit from FES to Left shoulder - pt states she tried this yesterday  4. Mood/Behavior/Sleep: LCSW to follow for evaluation and support.              -antipsychotic agents: N/A 5. Neuropsych/cognition: This patient may not be fully capable of making decisions on her own behalf. 6. Skin/Wound Care: Routine pressure relief measures, small ant delt sub cut cyst, does not appear inflammed likely chronic 7. Fluids/Electrolytes/Nutrition: Monitor I/O. Poor intake should improve if fluids can be upgraded to thins   10/24 MBS this am  8. CAS s/p stent: On ASA/Brilinta 9. HTN: Monitor BP TID--BP fluctuates, continue Losartan '25mg'$ . 10/16: BP up to 189/52 cont amlodipine '5mg'$  Vitals:   03/01/22 1930 03/02/22 0433  BP: (!) 132/53 (!) 113/59  Pulse: 75 65  Resp: 17 14  Temp: (!) 97.4 F (36.3 C) 98.4 F (36.9 C)  SpO2: 99% 100%  Good control 10/24   10. Leucocytosis/UTI: Treated with rocephin. 11. AKI: Improved with IVF on board-->d/c and encourage fluid intake.     Latest Ref Rng & Units 03/01/2022    5:12 AM 02/22/2022    5:50 AM 02/17/2022    8:00 AM  BMP  Glucose 70 - 99 mg/dL 106  107  109   BUN 8 - 23 mg/dL '24  20  24   '$ Creatinine 0.44 - 1.00  mg/dL 0.97  1.05  1.09   Sodium 135 - 145 mmol/L 140  137  138   Potassium 3.5 - 5.1 mmol/L 3.5  3.5  3.7   Chloride 98 - 111 mmol/L 114  107  110   CO2 22 - 32 mmol/L '18  19  19   '$ Calcium 8.9 - 10.3 mg/dL 9.1  9.7  9.0   If BUN climbs above 30 will restart nocturnal IVF  10/22- labs in AM 12. Dysphagia: Continue D3, honey thick liquids. Will d/c IVF and monitor hydration status.             --encourage water protocol,   10/23- on D3 nectar thick liquids- MBS in am  13. Urinary retention will cont flomax, I/O caths, UC + enterococcus, f/u sensitivities, discussed with pharmacy, 1 dose fosfomycin recommended received on 10/15, will trial low dose urecholine 14. Headache             -Continue Topamax and  d/c Fioricet  only 1 dose taken in 3 d 15. HLD             -Continue crestor 16. COPD             -Albuterol PRN 17. Hypothyroidism             -Continue synthroid 18. Low back pain: encouraged use of heating pad.     20.  Incont of bowel and bladder poor awareness and mobility issues contributing    LOS: 14 days A FACE TO FACE EVALUATION WAS PERFORMED  Charlett Blake 03/02/2022, 7:34 AM

## 2022-03-02 NOTE — Progress Notes (Signed)
Patient ID: Julia Deleon, female   DOB: [birth year], 76 y.o.   MRN: [id number]  SW met with patient to discuss bed offers. Patient will like to outsource to Julia Deleon.

## 2022-03-02 NOTE — Progress Notes (Signed)
Modified Barium Swallow Progress Note  Patient Details  Name: Julia Deleon MRN: [id number] Date of Birth: September 03, 1945  Today's Date: 03/02/2022  Modified Barium Swallow completed.  Full report located under Chart Review in the Imaging Section.  Brief recommendations include the following:  Clinical Impression  Pt presents with a mild oropharyngeal dysphagia with findings comparable to MBS completed on 02/03/22. Pt continues to exhibit delayed swallow initiation, incomplete laryngeal closure, reduced tongue base retraction, and inconsistent sensation to penetration/aspiration events (improved from initial study where penetration of nectar thick and thin liquids were primarily silent in nature). Pt appeared to sense penetration events with nectar thick liquids today. Majority of penetration was spontaneously ejected from laryngeal vestibule with secondary swallow or throat clear. There were no aspiration events observed under fluoro with nectar thick liquids administered by cup and straw. Pt exhibited consistent penetration with almost every presentation of thin liquids which was inconsistently sensed. Pt had silent aspiration with thin liquids by cup x1 which appeared unable to be fully ejected from laryngeal vestibule despite cough/throat clear attempts. There was no penetration or aspiration of puree, regular solid, or mixed consistencies. Oral phase was effective for consumption of regular textures with good pharyngeal clearance. Considering pt remains at risk for silent aspiration of thin liquids, had consistent penetration of thin liquids in which there was inconsistent sensation, evidence of fatigue, and pt has been known to be inconsistent with implementing safe swallowing compensatory strategies due to cognitive deficits impacting recall and awareness, SLP recommends a regular texture diet with nectar thick liquids. Allow straws. Meds may be given whole (one at a time) in applesauce/pudding, or  crushed. Pt is appropriate for water protocol (water only) between meals following thorough oral care efforts. Aspiration precautions to include upright positioning, small bites/sips, slow rate of consumption, and discontinue with fatigue.   Swallow Evaluation Recommendations       SLP Diet Recommendations: Regular solids;Nectar thick liquid   Liquid Administration via: Cup;Straw   Medication Administration: Whole meds with puree (or crushed as needed)   Supervision: Full supervision/cueing for compensatory strategies;Patient able to self feed   Compensations: Small sips/bites;Slow rate;Minimize environmental distractions   Postural Changes: Seated upright at 90 degrees   Oral Care Recommendations: Oral care BID   Other Recommendations: Prohibited food (jello, ice cream, thin soups);Have oral suction available;Order thickener from pharmacy;Remove water pitcher    [name] 03/02/2022,4:55 PM

## 2022-03-02 NOTE — Progress Notes (Signed)
Physical Therapy Session Note  Patient Details  Name: Julia Deleon MRN: [id number] Date of Birth: [birth year]  Today's Date: 03/02/2022 PT Individual Time: 1111-1208 PT Individual Time Calculation (min): 57 min   Short Term Goals: Week 1:  PT Short Term Goal 1 (Week 1): Pt will sit EOB with mod Assist up to 3 minteus PT Short Term Goal 1 - Progress (Week 1): Met PT Short Term Goal 2 (Week 1): Pt will initiate gait training PT Short Term Goal 2 - Progress (Week 1): Met PT Short Term Goal 3 (Week 1): Pt will trasnfer to [facility] with max assist of 1 PT Short Term Goal 3 - Progress (Week 1): Met PT Short Term Goal 4 (Week 1): Pt will perform sit<>stand with max assist of 1` PT Short Term Goal 4 - Progress (Week 1): Met PT Short Term Goal 5 (Week 1): Pt will perform FIST assessment PT Short Term Goal 5 - Progress (Week 1): Not met  Skilled Therapeutic Interventions/Progress Updates:  Patient supine/ sidelying to L side  on entrance to room. Patient alert and agreeable to PT session.   Patient with no pain complaint at start of session. With protected roll to L side, pt c/o L shoulder pain. Addressed with repositioning.   Therapeutic Activity: Bed Mobility: Pt performed supine --> sit with log roll technique to L side. Pt is able to push with RLE to bring hips stacked and demos ability to bring LLE toward EOB first with abd and then with increasing flexion d/t positioning of hips all with CGA. MinA to complete. Then MaxA with +2 for safety to push UB into upright seated position. VC/ tc required for technique throughout. Transfers: Pt performed sit<>stand and stand pivot transfers throughout session with ***. Provided verbal cues for***.  Gait Training:  Pt ambulated *** ft using *** with ***. Demonstrated ***. Provided vc/ tc for ***.  Wheelchair Mobility:  Pt propelled wheelchair *** feet with ***. Provided vc for ***.  Neuromuscular Re-ed: NMR facilitated during session with focus on***. Pt  guided in ***. NMR performed for improvements in motor control and coordination, balance, sequencing, judgement, and self confidence/ efficacy in performing all aspects of mobility at highest level of independence.   Therapeutic Exercise: Pt performed the following exercises with vc/ tc for proper technique. ***  Patient *** at end of session with brakes locked, *** alarm set, and all needs within reach.   Therapy Documentation Precautions:  Precautions Precautions: Fall Precaution Comments: L hemiparesis, L inattention Restrictions Weight Bearing Restrictions: No General:   Vital Signs: Therapy Vitals Temp: (!) 97.5 F (36.4 C) Temp Source: Oral Pulse Rate: 83 Resp: 16 BP: (!) 150/63 Patient Position (if appropriate): Sitting Oxygen Therapy SpO2: 99 % O2 Device: Room Air Pain:   Mobility:   Locomotion :    Trunk/Postural Assessment :    Balance:   Exercises:   Other Treatments:      Therapy/Group: {Therapy/Group:[id number]}  Alger Simons 03/02/2022, 2:16 PM

## 2022-03-03 LAB — URINALYSIS, ROUTINE W REFLEX MICROSCOPIC
Bilirubin Urine: NEGATIVE
Glucose, UA: NEGATIVE mg/dL
Hgb urine dipstick: NEGATIVE
Ketones, ur: NEGATIVE mg/dL
Nitrite: POSITIVE — AB
Protein, ur: 30 mg/dL — AB
Specific Gravity, Urine: 1.018 (ref 1.005–1.030)
WBC, UA: >50 WBC/hpf — ABNORMAL HIGH (ref 0–5)
pH: 5 (ref 5.0–8.0)

## 2022-03-03 LAB — GLUCOSE, CAPILLARY
Glucose-Capillary: 124 mg/dL — ABNORMAL HIGH (ref 70–99)
Glucose-Capillary: 111 mg/dL — ABNORMAL HIGH (ref 70–99)
Glucose-Capillary: 147 mg/dL — ABNORMAL HIGH (ref 70–99)
Glucose-Capillary: 118 mg/dL — ABNORMAL HIGH (ref 70–99)

## 2022-03-03 MED ORDER — TOPIRAMATE 25 MG PO TABS
50.0000 mg | ORAL_TABLET | Freq: Two times a day (BID) | ORAL | Status: DC
  Administered 2022-03-03 – 2022-03-04 (×4): 50 mg via ORAL
  Filled 2022-03-03 (×4): qty 2

## 2022-03-03 MED ORDER — BETHANECHOL CHLORIDE 25 MG PO TABS
25.0000 mg | ORAL_TABLET | Freq: Three times a day (TID) | ORAL | Status: DC
  Administered 2022-03-03 – 2022-03-04 (×6): 25 mg via ORAL
  Filled 2022-03-03 (×5): qty 1

## 2022-03-03 NOTE — Progress Notes (Signed)
Patient ID: Julia Deleon, female   DOB: July 11, 1945, 76 y.o.   MRN: [id number]  Sw discussed met with patient to follow up on selection of SNF. Patient reports spouse took list home yesterday and would like SW to follow up with spouse.   Sw reached out to patient spouse and left detailed VM, no additional questions or concerns from patient.

## 2022-03-03 NOTE — Progress Notes (Signed)
Occupational Therapy Session Note  Patient Details  Name: Julia Deleon MRN: [id number] Date of Birth: 1945-09-17  Today's Date: 03/03/2022 OT Individual Time: 1100-1155 OT Individual Time Calculation (min): 55 min    Short Term Goals: Week 2:  OT Short Term Goal 1 (Week 2): Pt will be able to rise to stand with mod A and then lift up into full extension to enable caregiver to assist with clothing management. OT Short Term Goal 2 (Week 2): pt will be able to maintain sit balance on BSC for 5 minutes without leaning to her L. OT Short Term Goal 3 (Week 2): Pt will be able to bathe with mod A.  Skilled Therapeutic Interventions/Progress Updates:    Pt received in [place] chair with spouse present. Tried to demonstrate to spouse the active movement I had seen in her arm on Monday.  Pt unable to duplicate and very distracted by pain.   Pt taken to gym and applied givemohr sling to support arm.  Had pt stand at // bars facing mirror for visual feedback.  Pt rose to stand by pulling on bar with mod A and then held stand with max A and max cues to keep head upright. She only tolerated standing for 1 min then c/o dizziness. Pt rested and tried again.  C/o dizziness again.  BP 127/ 64 in sit and after standing.  Pt kept saying she was tired and not feeling well. Pt returned to room to work on squat pivot to bed to R with max A.  Worked on sitting at EOB with min A and hand over hand guiding of LUE on board.  (Also had saebo stim one estim on L deltoid for 45 min during session) pt continually c/o arm pain and unable to find postural position to support shoulder as pt leaning forward.   Pt moved to supine.  LUE gentle PROM with no c/o pain. Pt in bed with all needs met and alarm on.   Therapy Documentation Precautions:  Precautions Precautions: Fall Precaution Comments: L hemiparesis, L inattention Restrictions Weight Bearing Restrictions: No  Vital Signs: Therapy Vitals Temp: 98.5 F (36.9 C) Temp  Source: Oral Pulse Rate: 79 Resp: 15 BP: 138/69 Patient Position (if appropriate): Lying Oxygen Therapy SpO2: 97 % O2 Device: Room Air Pain: Pain Assessment Pain Scale: 0-10 Pain Score: 6  Pain Location: Back Pain Orientation: Lower Pain Descriptors / Indicators: Aching Pain Onset: On-going Patients Stated Pain Goal: 0 Pain Intervention(s): Repositioned ADL: ADL Eating: Minimal assistance, Moderate cueing Where Assessed-Eating: Wheelchair Grooming: Minimal assistance Where Assessed-Grooming: Wheelchair, Sitting at sink Upper Body Bathing: Moderate assistance Where Assessed-Upper Body Bathing: Shower, Other (Comment) (Tilit in space shower chair) Lower Body Bathing: Maximal assistance Where Assessed-Lower Body Bathing: Shower (Tilt in space shower chair) Upper Body Dressing: Minimal assistance Where Assessed-Upper Body Dressing: Wheelchair Lower Body Dressing: Maximal assistance Where Assessed-Lower Body Dressing: Wheelchair Toileting: Maximal assistance Where Assessed-Toileting: Other (Comment) (Elevated toilet seat) Toilet Transfer: Maximal assistance Toilet Transfer Method: Engineer, water: Raised toilet seat Social research officer, government: Maximal assistance Social research officer, government Method: Radiographer, therapeutic: Other (comment) (Tilt in space shower chair)  Therapy/Group: Individual Therapy  [place] 03/03/2022, 8:51 AM

## 2022-03-03 NOTE — Progress Notes (Signed)
Physical Therapy Session Note  Patient Details  Name: Julia Deleon MRN: [id number] Date of Birth: [birth year]  Today's Date: 03/03/2022 PT Individual Time: 159-458   41 min  Short Term Goals: Week 1:  PT Short Term Goal 1 (Week 1): Pt will sit EOB with mod Assist up to 3 minteus PT Short Term Goal 1 - Progress (Week 1): Met PT Short Term Goal 2 (Week 1): Pt will initiate gait training PT Short Term Goal 2 - Progress (Week 1): Met PT Short Term Goal 3 (Week 1): Pt will trasnfer to [facility] with max assist of 1 PT Short Term Goal 3 - Progress (Week 1): Met PT Short Term Goal 4 (Week 1): Pt will perform sit<>stand with max assist of 1` PT Short Term Goal 4 - Progress (Week 1): Met PT Short Term Goal 5 (Week 1): Pt will perform FIST assessment PT Short Term Goal 5 - Progress (Week 1): Not met Week 2:  PT Short Term Goal 1 (Week 2): Pt will sit min assist up to 5 min with 1 UE supported on bed PT Short Term Goal 2 (Week 2): Pt will perform sit<>stand trasnfer with mod assist PT Short Term Goal 3 (Week 2): Pt will ambulate 69f with max assist +2 for safety at rail in hall PT Short Term Goal 4 (Week 2): [facility]:    Week 4:     Skilled Therapeutic Interventions/Progress Updates:   Pt received supine in bed and agreeable to PT with significant effort and encouragement from PT. Rolling R and L to doff/don brief with max assist from PT with cues for attention to task as well as awareness of LLE. Donning pants through bridge with totla A for clothing management and mod assist to stabilize the LLE. Supine>sit transfer with mod assist and cues for log roll technique and awareness of LLE. [facility] and max cues for sequencing.   [facility] transfer to and from mat table with max assist from PT and max cues for improved head hips relation ship as well as improved midline orientation sitting  balance EOB x 5 min with min assist fading to mod assist with fatigue. Patient returned to room and [facility] and all needs met.         Therapy Documentation Precautions:  Precautions Precautions: Fall Precaution Comments: L hemiparesis, L inattention Restrictions Weight Bearing Restrictions: No  Vital Signs: Therapy Vitals Temp: 98.5 F (36.9 C) Temp Source: Oral Pulse Rate: 79 Resp: 15 BP: 138/69 Patient Position (if appropriate): Lying Oxygen Therapy SpO2: 97 % O2 Device: Room Air Pain:  6/10 lower back. Pt repositioned.     Therapy/Group: Individual Therapy  ALorie Phenix10/25/2023, 8:03 AM

## 2022-03-03 NOTE — Patient Care Conference (Signed)
Inpatient RehabilitationTeam Conference and Plan of Care Update Date: 03/03/2022   Time: 10:11 AM    Patient Name: Julia Deleon      Medical Record Number: [id number]  Date of Birth: 21-Apr-1946 Sex: Female         Room/Bed: 4W13C/4W13C-01 Payor Info: Payor: MEDICARE / Plan: MEDICARE PART A AND B / Product Type: *No Product type* /    Admit Date/Time:  02/16/2022  2:30 PM  Primary Diagnosis:  Acute ischemic right MCA stroke [facility])  Hospital Problems: Principal Problem:   Acute ischemic right MCA stroke [facility])    Expected Discharge Date: Expected Discharge Date:  (SNF pending)  Team Members Present: Physician leading conference: Dr. Alysia Penna Social Worker Present: Erlene Quan, BSW Nurse Present: Dorien Chihuahua, RN PT Present: Barrie Folk, PT OT Present: Meriel Pica, OT SLP Present: Sherren Kerns, SLP PPS Coordinator present : Gunnar Fusi, SLP     Current Status/Progress Goal Weekly Team Focus  Bowel/Bladder   Incontinent of Bowel. In and Out Cath Q6  Remain B/B function.  Assist with scheduled toileting needs.   Swallow/Nutrition/ Hydration             ADL's   MOD A for ub bathing and MAX A for LB bathing from TIS shower chair, MIN A for UB dressing using hemi techniques, MAX A for LB dressing. MODA for squat pivot transfers. on 10/23 started moving her LUE!!! Can move in horizontal add with slight elb flex gravity eliminated. pt with improved sitting balance with CGA -  MINA. pt continues to present with chronic back pain, LUE hemuaparesis and decreased strength/endurance.  downgraded to MIN- total A  LUE NMR, postural control, L visual scanning, attention, ADL training   Mobility   Continued lethargy and pain in L shoulder; bed mobility = ModA, slide board transfers with Min/ ModA, squat pivot transfers with Mod/ MaxA, very short distance ambulation with minimal L knee buckle but MaxA to advance, continued MaxA to reach upright posture  overall ModA - with  time has potential to reach MinA  L NMR, standing balance and posture, sitting balance, bed mobility, transfers, ambulation, family education if going home   Communication             Safety/Cognition/ Behavioral Observations  mod-to-max A  mod A  attention, memory, problem solving, visual scanning, intellectual awareness   Pain   C/O headache and left knee pain. Intervention per [facility]  <3/10.  Assess QS and prn   Skin   Bruising on bdomen otherwise Grossly Intact  Prevent new skin breakdown  Assess Q shift and prn     Discharge Planning:  Disharging to SNF   Team Discussion: Patient is medically stable per MD; labs look good. Continue to note silent aspiration  on D3 nectar diet. Postural control improved and able to maintain sitting balance better.  Continue to note mod - max cognitive impairments with basic cognitive tasks, poor insight, perseveration and internal/external distraction.  Patient on target to meet rehab goals: Currently needs mod assist for bed mobility, max asssist for squat pivot, min assist for sitting balance.   *See Care Plan and progress notes for long and short-term goals.   Revisions to Treatment Plan:  Place foley at discharge Downgraded goals H2O protocol Whole pill trials MBS 03/02/22   Teaching Needs: Safety, medications, dietary modifications, transfers, toileting, etc.  Current Barriers to Discharge: Home enviroment access/layout and Lack of/limited family support  Possible Resolutions to Barriers: SNF placement  recommended     Medical Summary Current Status: Some improvement with lower extremity strength, left shoulder pain with range of motion consistent with impingement, remains incontinent of bowel and bladder, dysphagia without improvements on modified barium swallow  Barriers to Discharge: Medical stability   Possible Resolutions to Barriers/Weekly Focus: Plan is for skilled nursing placement once facility has been  identified   Continued Need for Acute Rehabilitation Level of Care: The patient requires daily medical management by a physician with specialized training in physical medicine and rehabilitation for the following reasons: Direction of a multidisciplinary physical rehabilitation program to maximize functional independence : Yes Medical management of patient stability for increased activity during participation in an intensive rehabilitation regime.: Yes Analysis of laboratory values and/or radiology reports with any subsequent need for medication adjustment and/or medical intervention. : Yes   I attest that I was present, lead the team conference, and concur with the assessment and plan of the team.   Dorien Chihuahua B 03/03/2022, 4:15 PM

## 2022-03-03 NOTE — Progress Notes (Addendum)
Patient ID: Julia Deleon, female   DOB: [birth year], 76 y.o.   MRN: [id number]  Patient family requesting [facility] in [place]. SW will follow up. Patient spouse inquiring about extension vs. SNF.  SW re-explained plan of care for patient for CIR, SNF and then home to benefit from as much therapy as possible.   Spouse following up with patient sister about other potential SNF options. Sister prefers [place], spouse prefer [place], patient prefers either.

## 2022-03-03 NOTE — Progress Notes (Signed)
Patient ID: Julia Deleon, female   DOB: 11/12/1945, 76 y.o.   MRN: [id number]  Team Conference Report to Patient/Family  Team Conference discussion was reviewed with the patient and caregiver, including goals, any changes in plan of care and target discharge date.  Patient and caregiver express understanding and are in agreement.  The patient has a target discharge date of  (SNF pending).  Sw spoke with patient and spouse and provided team conference updates. Patient spouse working with sister to select bed offer today, no additional questions or concerns. Spouse potentially will tour Insurance underwriter and [employer] today.  [name] 03/03/2022, 2:52 PM

## 2022-03-03 NOTE — Progress Notes (Signed)
PROGRESS NOTE   Subjective/Complaints:  No upgrade after silent aspiration seen on repeat MBS, pt c/o bladder pain , still requiring I/O cath  Left shoulder pain with movement but not at rest   ROS- denies pain; otherwise limited by cognition Objective:   DG Swallowing Func-Speech Pathology  Result Date: 03/02/2022 Table formatting from the original result was not included. Objective Swallowing Evaluation: Type of Study: MBS-Modified Barium Swallow Study  Patient Details Name: Julia Deleon MRN: [id number] Date of Birth: May 04, 1946 Today's Date: 03/02/2022 SLP Time Calculation (min) (ACUTE ONLY): 30 min Past Medical History: Past Medical History: Diagnosis Date  Arthritis   Chronic kidney disease   COPD (chronic obstructive pulmonary disease) ([place])   pt denies   Dyspnea   Family history of adverse reaction to anesthesia   father was slow to wake up  Fuch's endothelial dystrophy   GERD (gastroesophageal reflux disease)   Hyperlipidemia   Hypertension   Hypothyroidism   Pneumonia   hx of   PONV (postoperative nausea and vomiting)   slow to wake up and PONV Past Surgical History: Past Surgical History: Procedure Laterality Date  ABDOMINAL HYSTERECTOMY    APPENDECTOMY    BACK SURGERY  09/26/2018  L4-5 PLIF by Dr. Arnoldo Morale  CARDIAC CATHETERIZATION    CHOLECYSTECTOMY    COLONOSCOPY WITH PROPOFOL N/A 10/23/2020  Procedure: COLONOSCOPY WITH PROPOFOL;  Surgeon: Virgel Manifold, MD;  Location: [facility] ENDOSCOPY;  Service: Endoscopy;  Laterality: N/A;  ESOPHAGOGASTRODUODENOSCOPY N/A 10/23/2020  Procedure: ESOPHAGOGASTRODUODENOSCOPY (EGD);  Surgeon: Virgel Manifold, MD;  Location: [facility] ENDOSCOPY;  Service: Endoscopy;  Laterality: N/A;  EYE SURGERY    FOOT SURGERY Right   pin removed left  IR CT HEAD LTD  01/29/2022  IR CT HEAD LTD  01/29/2022  IR CT HEAD LTD  01/29/2022  IR INTRA CRAN STENT  01/29/2022  IR INTRAVSC STENT CERV CAROTID W/O EMB-PROT MOD SED INC  ANGIO  01/29/2022  IR PERCUTANEOUS ART THROMBECTOMY/INFUSION INTRACRANIAL INC DIAG ANGIO  01/29/2022  IR US GUIDE VASC ACCESS RIGHT  01/29/2022  laser vein surgery    NASAL SINUS SURGERY    RADIOLOGY WITH ANESTHESIA N/A 01/29/2022  Procedure: IR WITH ANESTHESIA;  Surgeon: Radiologist, Medication, MD;  Location: [facility];  Service: Radiology;  Laterality: N/A;  RIGHT/LEFT HEART CATH AND CORONARY ANGIOGRAPHY N/A 12/06/2017  Procedure: RIGHT/LEFT HEART CATH AND CORONARY ANGIOGRAPHY;  Surgeon: Yolonda Kida, MD;  Location: [facility];  Service: Cardiovascular;  Laterality: N/A;  ROBOTIC ASSISTED LAPAROSCOPIC SACROCOLPOPEXY Bilateral 07/30/2020  Procedure: XI ROBOTIC ASSISTED LAPAROSCOPIC SACROCOLPOPEXY AND SUPRACERVICAL HYSTERECTOMY WITH BILATERAL SALPINGO OOPHERECTOMY;  Surgeon: Ardis Hughs, MD;  Location: [facility];  Service: Urology;  Laterality: Bilateral;  REQUESTING 4 HRS  TONSILLECTOMY   HPI: 76 year old female patient who was admitted on 9/22 for acute onset left-sided weakness, left facial droop, dysarthria and neglect. CT angiogram of head and neck showed acute occlusion of proximal right M1 and nonopacification of right M2/M3, severe stenosis at right ICA. Pt now s/p TNK and thrombectomy. Bradycardia and hypotention noted post op.Pt underwent a BSE and MBS - diet was started as dys3/honey thick.  Follow up indicated for dysphagia management and cognitive  linguistic treatment.  Subjective: fully alert, significant left inattention  Recommendations for follow up therapy are one component of a multi-disciplinary discharge planning process, led by the attending physician.  Recommendations may be updated based on patient status, additional functional criteria and insurance authorization. Assessment / Plan / Recommendation   03/02/2022   4:52 PM Clinical Impressions Clinical Impression Pt presents with a mild oropharyngeal dysphagia with findings comparable to MBS completed on 02/03/22. Pt continues to  exhibit delayed swallow initiation, incomplete laryngeal closure, reduced tongue base retraction, and inconsistent sensation to penetration/aspiration events (improved from initial study where penetration of nectar thick and thin liquids were primarily silent in nature). Pt appeared to sense penetration events with nectar thick liquids today. Majority of penetration was spontaneously ejected from laryngeal vestibule with secondary swallow or throat clear. There were no aspiration events observed under fluoro with nectar thick liquids administered by cup and straw. Pt exhibited consistent penetration with almost every presentation of thin liquids which was inconsistently sensed. Pt had silent aspiration with thin liquids by cup x1 which appeared unable to be fully ejected from laryngeal vestibule despite cough/throat clear attempts. There was no penetration or aspiration of puree, regular solid, or mixed consistencies. Oral phase was effective for consumption of regular textures with good pharyngeal clearance. Considering pt remains at risk for silent aspiration of thin liquids, had consistent penetration of thin liquids in which there was inconsistent sensation, evidence of fatigue, and pt has been known to be inconsistent with implementing safe swallowing compensatory strategies due to cognitive deficits impacting recall and awareness, SLP recommends a regular texture diet with nectar thick liquids. Allow straws. Meds may be given whole (one at a time) in applesauce/pudding, or crushed. Pt is appropriate for water protocol (water only) between meals following thorough oral care efforts. Aspiration precautions to include upright positioning, small bites/sips, slow rate of consumption, and discontinue with fatigue. SLP Visit Diagnosis Dysphagia, oropharyngeal phase (R13.12) Impact on safety and function Mild aspiration risk     03/02/2022   4:52 PM Treatment Recommendations Treatment Recommendations Therapy as  outlined in treatment plan below     03/02/2022   4:54 PM Prognosis Prognosis for Safe Diet Advancement Good Barriers to Reach Goals Cognitive deficits   03/02/2022   4:52 PM Diet Recommendations SLP Diet Recommendations Regular solids;Nectar thick liquid Liquid Administration via Cup;Straw Medication Administration Whole meds with puree Compensations Small sips/bites;Slow rate;Minimize environmental distractions Postural Changes Seated upright at 90 degrees     03/02/2022   4:52 PM Other Recommendations Oral Care Recommendations Oral care BID Other Recommendations Prohibited food (jello, ice cream, thin soups);Have oral suction available;Order thickener from pharmacy;Remove water pitcher Follow Up Recommendations Acute inpatient rehab (3hours/day) Assistance recommended at discharge Frequent or constant Supervision/Assistance Functional Status Assessment Patient has had a recent decline in their functional status and demonstrates the ability to make significant improvements in function in a reasonable and predictable amount of time.   02/03/2022  12:20 PM Frequency and Duration  Speech Therapy Frequency (ACUTE ONLY) min 2x/week Treatment Duration 2 weeks     03/02/2022   4:46 PM Oral Phase Oral - Thin Teaspoon Premature spillage;Lingual/palatal residue;Delayed oral transit Oral - Thin Cup Lingual/palatal residue;Premature spillage;Delayed oral transit Oral - Thin Straw Lingual/palatal residue;Decreased bolus cohesion;Premature spillage    03/02/2022   4:47 PM Pharyngeal Phase Pharyngeal Phase Impaired Pharyngeal- Nectar Teaspoon NT Pharyngeal- Nectar Cup Delayed swallow initiation-pyriform sinuses;Penetration/Aspiration during swallow Pharyngeal Material enters airway, remains ABOVE vocal cords then ejected out Pharyngeal- Thin  Teaspoon Delayed swallow initiation-pyriform sinuses;Reduced airway/laryngeal closure;Penetration/Aspiration during swallow Pharyngeal Material enters airway, remains ABOVE vocal cords and  not ejected out Pharyngeal- Thin Cup Delayed swallow initiation-pyriform sinuses;Penetration/Aspiration during swallow;Penetration/Aspiration before swallow;Reduced airway/laryngeal closure Pharyngeal Material enters airway, CONTACTS cords and not ejected out;Material enters airway, passes BELOW cords without attempt by patient to eject out (silent aspiration);Material enters airway, remains ABOVE vocal cords and not ejected out Pharyngeal- Thin Straw Delayed swallow initiation-pyriform sinuses;Reduced airway/laryngeal closure;Penetration/Aspiration during swallow Pharyngeal Material enters airway, CONTACTS cords and not ejected out Pharyngeal- Puree [facility] Pharyngeal Material does not enter airway Pharyngeal- Regular [facility] Pharyngeal Material does not enter airway    03/02/2022   4:52 PM Cervical Esophageal Phase  Cervical Esophageal Phase [facility] Charna Elizabeth M.S., CCC-SLP Brianne T Garretson 03/02/2022, 4:56 PM                     Recent Labs    03/01/22 0512  WBC 7.0  HGB 12.0  HCT 35.3*  PLT 161     Recent Labs    03/01/22 0512  NA 140  K 3.5  CL 114*  CO2 18*  GLUCOSE 106*  BUN 24*  CREATININE 0.97  CALCIUM 9.1      Intake/Output Summary (Last 24 hours) at 03/03/2022 0732 Last data filed at 03/02/2022 2241 Gross per 24 hour  Intake 712 ml  Output 900 ml  Net -188 ml         Physical Exam: Vital Signs Blood pressure 138/69, pulse 79, temperature 98.5 F (36.9 C), temperature source Oral, resp. rate 15, weight 66.3 kg, SpO2 97 %.   General: No acute distress Mood and affect are appropriate Heart: Regular rate and rhythm no rubs murmurs or extra sounds Lungs: Clear to auscultation, breathing unlabored, no rales or wheezes Abdomen: Positive bowel sounds, soft nontender to palpation, nondistended Extremities: No clubbing, cyanosis, or edema Skin: No evidence of breakdown, no evidence of rash   Neurologic: Cranial nerves II through XII intact, motor strength is 5/5 in  right and 0/5 deltoid, 2- horz add, 2- bicep, tricep, grip, 2-/5 hip flexor, knee extensors, 0/5 ankle dorsiflexor and plantar flexor   Musculoskeletal: mild pain with head rotation     Assessment/Plan: 1. Functional deficits which require 3+ hours per day of interdisciplinary therapy in a comprehensive inpatient rehab setting. Physiatrist is providing close team supervision and 24 hour management of active medical problems listed below. Physiatrist and rehab team continue to assess barriers to discharge/monitor patient progress toward functional and medical goals  Care Tool:  Bathing    Body parts bathed by patient: Face, Abdomen, Front perineal area, Chest, Left arm, Left upper leg, Right upper leg   Body parts bathed by helper: Right arm, Left arm, Left lower leg, Buttocks, Right lower leg     Bathing assist Assist Level: Maximal Assistance - Patient 24 - 49%     Upper Body Dressing/Undressing Upper body dressing   What is the patient wearing?: Pull over shirt    Upper body assist Assist Level: Moderate Assistance - Patient 50 - 74%    Lower Body Dressing/Undressing Lower body dressing      What is the patient wearing?: Pants     Lower body assist Assist for lower body dressing: Maximal Assistance - Patient 25 - 49%     Toileting Toileting    Toileting assist Assist for toileting: Dependent - Patient 0%     Transfers Chair/bed transfer  Transfers assist  Chair/bed transfer activity  did not occur: Safety/medical concerns  Chair/bed transfer assist level: Maximal Assistance - Patient 25 - 49%     Locomotion Ambulation   Ambulation assist   Ambulation activity did not occur: Safety/medical concerns      Max distance: Tilt in space wc   Walk 10 feet activity   Assist  Walk 10 feet activity did not occur: Safety/medical concerns        Walk 50 feet activity   Assist Walk 50 feet with 2 turns activity did not occur: Safety/medical  concerns         Walk 150 feet activity   Assist Walk 150 feet activity did not occur: Safety/medical concerns         Walk 10 feet on uneven surface  activity   Assist Walk 10 feet on uneven surfaces activity did not occur: Safety/medical concerns         Wheelchair     Assist Is the patient using a wheelchair?: Yes Type of Wheelchair: Manual    Wheelchair assist level: Dependent - Patient 0% Max wheelchair distance: 175f in TIS    Wheelchair 50 feet with 2 turns activity    Assist        Assist Level: Dependent - Patient 0%   Wheelchair 150 feet activity     Assist      Assist Level: Dependent - Patient 0%   Blood pressure 138/69, pulse 79, temperature 98.5 F (36.9 C), temperature source Oral, resp. rate 15, weight 66.3 kg, SpO2 97 %.  Medical Problem List and Plan: 1. Functional deficits secondary to   Right MCA infarct s/p TNK and IR with TICI2c, R ICA stent with left hemiplegia             -patient may  shower             -ELOS/Goals: 12-14 days             -plan is for SNF post d/c, Team conference today please see physician documentation under team conference tab, met with team  to discuss problems,progress, and goals. Formulized individual treatment plan based on medical history, underlying problem and comorbidities.               -PRAFO ordered            dc amantadine, no issues with arousal , may be causing side  effects   Con't CIR- PT, and OT and SLP 2.  Antithrombotics: -DVT/anticoagulation:  Pharmaceutical: Lovenox             -antiplatelet therapy: ASA/Brilinta.  3. Headache: scheduled tylenol 6563mQID. Also on topirimate 10040mID- improving  Also has post CVA left shoulder pain ,cont PT, OT, may benefit from FES to Left shoulder - pt states she tried this yesterday  4. Mood/Behavior/Sleep: LCSW to follow for evaluation and support.              -antipsychotic agents: N/A 5. Neuropsych/cognition: This patient may not  be fully capable of making decisions on her own behalf. 6. Skin/Wound Care: Routine pressure relief measures, small ant delt sub cut cyst, does not appear inflammed likely chronic 7. Fluids/Electrolytes/Nutrition: Monitor I/O. Poor intake should improve if fluids can be upgraded to thins   10/24 MBS this am  8. CAS s/p stent: On ASA/Brilinta 9. HTN: Monitor BP TID--BP fluctuates, continue Losartan 27m31m0/16: BP up to 189/52 cont amlodipine 5mg 43mals:   03/03/22 0501 03/03/22 0502  BP: 138/69 138/69  Pulse:  79 79  Resp: 15 15  Temp: 98.5 F (36.9 C) 98.5 F (36.9 C)  SpO2: 97% 97%  Good control 10/25   10. Leucocytosis/UTI: Treated with rocephin. 11. AKI: Improved with IVF on board-->d/c and encourage fluid intake.     Latest Ref Rng & Units 03/01/2022    5:12 AM 02/22/2022    5:50 AM 02/17/2022    8:00 AM  BMP  Glucose 70 - 99 mg/dL 106  107  109   BUN 8 - 23 mg/dL '24  20  24   ' Creatinine 0.44 - 1.00 mg/dL 0.97  1.05  1.09   Sodium 135 - 145 mmol/L 140  137  138   Potassium 3.5 - 5.1 mmol/L 3.5  3.5  3.7   Chloride 98 - 111 mmol/L 114  107  110   CO2 22 - 32 mmol/L '18  19  19   ' Calcium 8.9 - 10.3 mg/dL 9.1  9.7  9.0   If BUN climbs above 30 will restart nocturnal IVF  10/22- labs in AM 12. Dysphagia: Continue D3, honey thick liquids. Will d/c IVF and monitor hydration status.             --encourage water protocol,   10/23- on D3 nectar thick liquids- MBS no changes 13. Urinary retention will cont flomax, I/O caths, UC + enterococcus, f/u sensitivities, discussed with pharmacy, 1 dose fosfomycin recommended received on 10/15, will trial low dose urecholine increae to 75m, may need foley for SNF with Uro f/u, recheck UA pt c/o suprapubic pain 14. Headache             -Continue Topamax but reduce to 51m and  off Fioricet   15. HLD             -Continue crestor 16. COPD             -Albuterol PRN 17. Hypothyroidism             -Continue synthroid 18. Low back pain:  encouraged use of heating pad.     20.  Incont of bowel and bladder poor awareness and mobility issues contributing    LOS: 15 days A FACE TO FACE EVALUATION WAS PERFORMED  AnCharlett Blake0/25/2023, 7:32 AM

## 2022-03-03 NOTE — Progress Notes (Signed)
Occupational Therapy Session Note  Patient Details  Name: Julia Deleon MRN: [id number] Date of Birth: Aug 30, 1945  Today's Date: 03/03/2022 OT Individual Time: 4982-6415 OT Individual Time Calculation (min): 42 min    Short Term Goals: Week 2:  OT Short Term Goal 1 (Week 2): Pt will be able to rise to stand with mod A and then lift up into full extension to enable caregiver to assist with clothing management. OT Short Term Goal 2 (Week 2): pt will be able to maintain sit balance on BSC for 5 minutes without leaning to her L. OT Short Term Goal 3 (Week 2): Pt will be able to bathe with mod A.  Skilled Therapeutic Interventions/Progress Updates:  Pt received asleep in supine, pt easily able to arouse and agreeable to OT intervention. Pt completed supine>sit with MOD A needing step by step cues for sequencing and assist to maneuver LLE and elevate trunk into sitting. Donned shoes from EOB with total A with pt able sit EOB with RUE supported on bed rail with CGA. Pt completed SB transfer to L side with total A for board placement with MOD A +2. Pt needed total A to adjust hips in chair.  Total A transport to gym, utilized saebo mobile arm support  to work on gravity eliminated ROM, donned wrist cock up splint to support wrist integrity. Pt reports pain in L shoulder when attempting to move through scapular protraction/retraction therefore decreased AROM for pain mgmt, pt was able to demo elbow flexion/extension  2-/5 with resistance at 3.5. pt needed MIN A to achieve full ROM for elbow flexion/extension as pt reports " I just dont think my arm will move that way anyway more."  Pt seemed to like using device as pt reports "let just do 2 more."  Total A transport back to room where pt completed SB transfer back to bed to R side with MODA +2. Pt needed MOD A for sit>supine even with pt cued to hook RLE underneath LLE.  Pt left in chair position in bed, set- up for lunch with nursing students entering to  assess vitals.   Therapy Documentation Precautions:  Precautions Precautions: Fall Precaution Comments: L hemiparesis, L inattention Restrictions Weight Bearing Restrictions: No   Therapy/Group: Individual Therapy  [name] 03/03/2022, 2:11 PM

## 2022-03-03 NOTE — Progress Notes (Signed)
Speech Language Pathology Daily Session Note  Patient Details  Name: Julia Deleon MRN: [id number] Date of Birth: May 12, 1945  Today's Date: 03/03/2022 SLP Individual Time: 0900-1000 SLP Individual Time Calculation (min): 60 min  Short Term Goals: Week 2: SLP Short Term Goal 1 (Week 2): STG=LTG due to ELOS  Skilled Therapeutic Interventions: Skilled ST treatment focused on cognitive and swallowing goals. Pt greeted upright in wheelchair on arrival. SLP facilitated session by providing education on MBS results, diet recommendations, swallowing compensatory strategies/precautions. SLP provided a handout reinforcing recommendations, ordering information for nectar thick liquids, and swallowing precautions. Pt demonstrated incomplete understanding of this education due to cognitive factors and perseverating on other topics.   SLP facilitated attention, working memory, use of external memory aids, and visual scanning task by locating list of items in hospital gift shop. Pt located items with mod A verbal cues for left visual scanning, mod A for use of external memory aid to recall 5 items on list, and mod progressing to max A for attention to task 2/2 internal and external distractibility (activity took place within a mildly distracting environment).   Patient was returned to room and left in bed with alarm activated and immediate needs within reach at end of session. Continue per current plan of care.      Pain Pain Assessment Pain Scale: 0-10 Pain Score: 6  Pain Location: Back Pain Orientation: Lower Pain Descriptors / Indicators: Aching Pain Onset: On-going Patients Stated Pain Goal: 0 Pain Intervention(s): Repositioned  Therapy/Group: Individual Therapy  Patty Sermons 03/03/2022, 10:29 AM

## 2022-03-04 LAB — GLUCOSE, CAPILLARY
Glucose-Capillary: 118 mg/dL — ABNORMAL HIGH (ref 70–99)
Glucose-Capillary: 119 mg/dL — ABNORMAL HIGH (ref 70–99)
Glucose-Capillary: 94 mg/dL (ref 70–99)

## 2022-03-04 MED ORDER — FOOD THICKENER (SIMPLYTHICK): 1.0000 | ORAL | Status: AC | PRN

## 2022-03-04 MED ORDER — NITROFURANTOIN MONOHYD MACRO 100 MG PO CAPS: 100.0000 mg | ORAL_CAPSULE | Freq: Two times a day (BID) | ORAL | Status: AC

## 2022-03-04 MED ORDER — NITROFURANTOIN MONOHYD MACRO 100 MG PO CAPS
100.0000 mg | ORAL_CAPSULE | Freq: Two times a day (BID) | ORAL | Status: DC
  Administered 2022-03-04: 100 mg via ORAL
  Filled 2022-03-04 (×2): qty 1

## 2022-03-04 MED ORDER — BUTALBITAL-APAP-CAFFEINE 50-325-40 MG PO TABS: 1.0000 | ORAL_TABLET | Freq: Two times a day (BID) | ORAL | 0 refills | Status: AC | PRN

## 2022-03-04 MED ORDER — TRAZODONE HCL 50 MG PO TABS: 25.0000 mg | ORAL_TABLET | Freq: Every evening | ORAL | Status: AC | PRN

## 2022-03-04 MED ORDER — ACETAMINOPHEN 325 MG PO TABS: 650.0000 mg | ORAL_TABLET | Freq: Three times a day (TID) | ORAL | Status: AC

## 2022-03-04 MED ORDER — DICLOFENAC SODIUM 1 % EX GEL: 2.0000 g | Freq: Three times a day (TID) | CUTANEOUS | Status: AC

## 2022-03-04 MED ORDER — PHENOL 1.4 % MT LIQD: 1.0000 | OROMUCOSAL | 0 refills | Status: AC | PRN

## 2022-03-04 MED ORDER — TOPIRAMATE 50 MG PO TABS: 50.0000 mg | ORAL_TABLET | Freq: Two times a day (BID) | ORAL | Status: AC

## 2022-03-04 MED ORDER — BETHANECHOL CHLORIDE 25 MG PO TABS: 25.0000 mg | ORAL_TABLET | Freq: Three times a day (TID) | ORAL | Status: AC

## 2022-03-04 MED ORDER — AMLODIPINE BESYLATE 5 MG PO TABS: 5.0000 mg | ORAL_TABLET | Freq: Every day | ORAL | Status: AC

## 2022-03-04 MED ORDER — LORATADINE 10 MG PO TABS: 10.0000 mg | ORAL_TABLET | Freq: Every day | ORAL | Status: AC

## 2022-03-04 MED ORDER — ROSUVASTATIN CALCIUM 20 MG PO TABS: 20.0000 mg | ORAL_TABLET | Freq: Every day | ORAL | 0 refills | Status: AC

## 2022-03-04 MED ORDER — SENNOSIDES-DOCUSATE SODIUM 8.6-50 MG PO TABS: 2.0000 | ORAL_TABLET | Freq: Two times a day (BID) | ORAL | 0 refills | Status: AC

## 2022-03-04 NOTE — Progress Notes (Signed)
Patient had a quite night. C/O headache/ muscular pain. Medication administered per MAR. In and Out cath this morning ( refer to I/O's for details). Positioned comfortably for breakfast. Safety maintained at all times.

## 2022-03-04 NOTE — Plan of Care (Signed)
Requiring I+O caths Q 6 hours; urinary retention with inability to void on own with medications; foley placed for discharge

## 2022-03-04 NOTE — Plan of Care (Signed)
  Problem: Sit to Stand Goal: LTG:  Patient will perform sit to stand in prep for activites of daily living with assistance level (OT) Description: LTG:  Patient will perform sit to stand in prep for activites of daily living with minimal assistance DME PRN.   Outcome: Not Met (add Reason)   Problem: RH Bathing Goal: LTG Patient will bathe all body parts with assist levels (OT) Description: LTG: Patient will bathe all body parts with moderate assistance and minimal verbal cueing DME and AD PRN.  Outcome: Not Met (add Reason)   Problem: RH Functional Use of Upper Extremity Goal: LTG Patient will use RT/LT upper extremity as a (OT) Description: LTG: Patient will use LUE as a gross assist without verbal cues during functional activity.  Outcome: Not Met (add Reason)   Problem: RH Grooming Goal: LTG Patient will perform grooming w/assist,cues/equip (OT) Description: LTG: Patient will perform grooming with min assist while sitting/standing at the sink. Outcome: Completed/Met   Problem: RH Dressing Goal: LTG Patient will perform upper body dressing (OT) Description: LTG Patient will perform upper body dressing with min assist. Outcome: Completed/Met Goal: LTG Patient will perform lower body dressing w/assist (OT) Description: LTG: Patient will perform lower body dressing with moderate assist AD PRN.  Outcome: Completed/Met   Problem: RH Toileting Goal: LTG Patient will perform toileting task (3/3 steps) with assistance level (OT) Description: LTG: Patient will perform toileting task (3/3 steps) with minimal assistance. Outcome: Completed/Met   Problem: RH Toilet Transfers Goal: LTG Patient will perform toilet transfers w/assist (OT) Description: LTG: Patient will perform toilet transfers with min assist DME PRN.  Outcome: Completed/Met   Problem: RH Memory Goal: LTG Patient will demonstrate ability for day to day recall/carry over during activities of daily living with assistance  level (OT) Description: LTG:  Patient will demonstrate ability for day to day recall/carry over during activities of daily living with minimal assistance.  Outcome: Completed/Met

## 2022-03-04 NOTE — Progress Notes (Signed)
Occupational Therapy Session Note  Patient Details  Name: Julia Deleon MRN: [id number] Date of Birth: 1946/04/03  Today's Date: 03/04/2022 OT Individual Time: 1046-1200 OT Individual Time Calculation (min): 74 min    Short Term Goals: Week 2:  OT Short Term Goal 1 (Week 2): Pt will be able to rise to stand with mod A and then lift up into full extension to enable caregiver to assist with clothing management. OT Short Term Goal 2 (Week 2): pt will be able to maintain sit balance on BSC for 5 minutes without leaning to her L. OT Short Term Goal 3 (Week 2): Pt will be able to bathe with mod A.  Skilled Therapeutic Interventions/Progress Updates:  Pt greeted supien in bed, pt reproting fatigue, and pain in back and "everywhere," rest breaks provided as needed with RN entering to provide pain meds. Donned pants from bed level with pt needing MAX A, pt able to roll R<>L with MIN A to pull pants to waist line.  Pt transitioned from supine>sit with MODA needing assist to maneuver LLE but able to assist with elevating trunk into sitting. Once EOB pt reports HA and needs to lay back down, transitioned pt to supine with total A with BP assessed:   BP supine 150/57 ( 81) HR 78, RN aware.  Pt agreeable to transition back to sitting with same level of assist. Once EOB, donned OH shirt and bra with MOD A. Pt able to static sit EOB with CGA- MINA. Pt completed SB transfer to L side with MOD A.  Total A transport to gym where pt worked on sit>stands at [employer] with LUE and LLE supported, pt stood with overall MODA however pt reports that shes " going to pass out." Pt returned to sitting in w/c with w/c tilted.   Remainder of session focus on AAROM of LUE, worked with arm scooter to facilitate improved AAROM in LUE, pt does have mild activation in horizontal shoulder ABD/ADD however pt continues to be limited by pain when working on LUE, therefore provided rest breaks, repositioning and external support as pain  mgmt strategies. Of note pt was able to pronate wrist with wrist starting from neutral position.   Pt transported back to room in w/c with total A with pt completing SB transfer from w/c>EOB with MOD A, MOD A for sit>supine. Pt left supine in bed with all needs within reach and bed alarm activated.    Therapy Documentation Precautions:  Precautions Precautions: Fall Precaution Comments: L hemiparesis, L inattention Restrictions Weight Bearing Restrictions: No   Therapy/Group: Individual Therapy  [name] [facility] 03/04/2022, 12:30 PM

## 2022-03-04 NOTE — Progress Notes (Signed)
PROGRESS NOTE   Subjective/Complaints:  HA unchanged , has chronic low back and L post stroke shoulder pain   ROS- denies pain; otherwise limited by cognition Objective:   DG Swallowing Func-Speech Pathology  Result Date: 03/02/2022 Table formatting from the original result was not included. Objective Swallowing Evaluation: Type of Study: MBS-Modified Barium Swallow Study  Patient Details Name: Julia Deleon MRN: [id number] Date of Birth: May 19, 1945 Today's Date: 03/02/2022 SLP Time Calculation (min) (ACUTE ONLY): 30 min Past Medical History: Past Medical History: Diagnosis Date  Arthritis   Chronic kidney disease   COPD (chronic obstructive pulmonary disease) ([place])   pt denies   Dyspnea   Family history of adverse reaction to anesthesia   father was slow to wake up  Fuch's endothelial dystrophy   GERD (gastroesophageal reflux disease)   Hyperlipidemia   Hypertension   Hypothyroidism   Pneumonia   hx of   PONV (postoperative nausea and vomiting)   slow to wake up and PONV Past Surgical History: Past Surgical History: Procedure Laterality Date  ABDOMINAL HYSTERECTOMY    APPENDECTOMY    BACK SURGERY  09/26/2018  L4-5 PLIF by Dr. Arnoldo Morale  CARDIAC CATHETERIZATION    CHOLECYSTECTOMY    COLONOSCOPY WITH PROPOFOL N/A 10/23/2020  Procedure: COLONOSCOPY WITH PROPOFOL;  Surgeon: Virgel Manifold, MD;  Location: [facility] ENDOSCOPY;  Service: Endoscopy;  Laterality: N/A;  ESOPHAGOGASTRODUODENOSCOPY N/A 10/23/2020  Procedure: ESOPHAGOGASTRODUODENOSCOPY (EGD);  Surgeon: Virgel Manifold, MD;  Location: [facility] ENDOSCOPY;  Service: Endoscopy;  Laterality: N/A;  EYE SURGERY    FOOT SURGERY Right   pin removed left  IR CT HEAD LTD  01/29/2022  IR CT HEAD LTD  01/29/2022  IR CT HEAD LTD  01/29/2022  IR INTRA CRAN STENT  01/29/2022  IR INTRAVSC STENT CERV CAROTID W/O EMB-PROT MOD SED INC ANGIO  01/29/2022  IR PERCUTANEOUS ART THROMBECTOMY/INFUSION INTRACRANIAL INC DIAG ANGIO   01/29/2022  IR US GUIDE VASC ACCESS RIGHT  01/29/2022  laser vein surgery    NASAL SINUS SURGERY    RADIOLOGY WITH ANESTHESIA N/A 01/29/2022  Procedure: IR WITH ANESTHESIA;  Surgeon: Radiologist, Medication, MD;  Location: [place];  Service: Radiology;  Laterality: N/A;  RIGHT/LEFT HEART CATH AND CORONARY ANGIOGRAPHY N/A 12/06/2017  Procedure: RIGHT/LEFT HEART CATH AND CORONARY ANGIOGRAPHY;  Surgeon: Yolonda Kida, MD;  Location: [place] CV LAB;  Service: Cardiovascular;  Laterality: N/A;  ROBOTIC ASSISTED LAPAROSCOPIC SACROCOLPOPEXY Bilateral 07/30/2020  Procedure: XI ROBOTIC ASSISTED LAPAROSCOPIC SACROCOLPOPEXY AND SUPRACERVICAL HYSTERECTOMY WITH BILATERAL SALPINGO OOPHERECTOMY;  Surgeon: Ardis Hughs, MD;  Location: [facility]S;  Service: Urology;  Laterality: Bilateral;  REQUESTING 4 HRS  TONSILLECTOMY   HPI: 76 year old female patient who was admitted on 9/22 for acute onset left-sided weakness, left facial droop, dysarthria and neglect. CT angiogram of head and neck showed acute occlusion of proximal right M1 and nonopacification of right M2/M3, severe stenosis at right ICA. Pt now s/p TNK and thrombectomy. Bradycardia and hypotention noted post op.Pt underwent a BSE and MBS - diet was started as dys3/honey thick.  Follow up indicated for dysphagia management and cognitive linguistic treatment.  Subjective: fully alert, significant left inattention  Recommendations for follow up therapy  are one component of a multi-disciplinary discharge planning process, led by the attending physician.  Recommendations may be updated based on patient status, additional functional criteria and insurance authorization. Assessment / Plan / Recommendation   03/02/2022   4:52 PM Clinical Impressions Clinical Impression Pt presents with a mild oropharyngeal dysphagia with findings comparable to MBS completed on 02/03/22. Pt continues to exhibit delayed swallow initiation, incomplete laryngeal closure, reduced tongue base  retraction, and inconsistent sensation to penetration/aspiration events (improved from initial study where penetration of nectar thick and thin liquids were primarily silent in nature). Pt appeared to sense penetration events with nectar thick liquids today. Majority of penetration was spontaneously ejected from laryngeal vestibule with secondary swallow or throat clear. There were no aspiration events observed under fluoro with nectar thick liquids administered by cup and straw. Pt exhibited consistent penetration with almost every presentation of thin liquids which was inconsistently sensed. Pt had silent aspiration with thin liquids by cup x1 which appeared unable to be fully ejected from laryngeal vestibule despite cough/throat clear attempts. There was no penetration or aspiration of puree, regular solid, or mixed consistencies. Oral phase was effective for consumption of regular textures with good pharyngeal clearance. Considering pt remains at risk for silent aspiration of thin liquids, had consistent penetration of thin liquids in which there was inconsistent sensation, evidence of fatigue, and pt has been known to be inconsistent with implementing safe swallowing compensatory strategies due to cognitive deficits impacting recall and awareness, SLP recommends a regular texture diet with nectar thick liquids. Allow straws. Meds may be given whole (one at a time) in applesauce/pudding, or crushed. Pt is appropriate for water protocol (water only) between meals following thorough oral care efforts. Aspiration precautions to include upright positioning, small bites/sips, slow rate of consumption, and discontinue with fatigue. SLP Visit Diagnosis Dysphagia, oropharyngeal phase (R13.12) Impact on safety and function Mild aspiration risk     03/02/2022   4:52 PM Treatment Recommendations Treatment Recommendations Therapy as outlined in treatment plan below     03/02/2022   4:54 PM Prognosis Prognosis for Safe Diet  Advancement Good Barriers to Reach Goals Cognitive deficits   03/02/2022   4:52 PM Diet Recommendations SLP Diet Recommendations Regular solids;Nectar thick liquid Liquid Administration via Cup;Straw Medication Administration Whole meds with puree Compensations Small sips/bites;Slow rate;Minimize environmental distractions Postural Changes Seated upright at 90 degrees     03/02/2022   4:52 PM Other Recommendations Oral Care Recommendations Oral care BID Other Recommendations Prohibited food (jello, ice cream, thin soups);Have oral suction available;Order thickener from pharmacy;Remove water pitcher Follow Up Recommendations Acute inpatient rehab (3hours/day) Assistance recommended at discharge Frequent or constant Supervision/Assistance Functional Status Assessment Patient has had a recent decline in their functional status and demonstrates the ability to make significant improvements in function in a reasonable and predictable amount of time.   02/03/2022  12:20 PM Frequency and Duration  Speech Therapy Frequency (ACUTE ONLY) min 2x/week Treatment Duration 2 weeks     03/02/2022   4:46 PM Oral Phase Oral - Thin Teaspoon Premature spillage;Lingual/palatal residue;Delayed oral transit Oral - Thin Cup Lingual/palatal residue;Premature spillage;Delayed oral transit Oral - Thin Straw Lingual/palatal residue;Decreased bolus cohesion;Premature spillage    03/02/2022   4:47 PM Pharyngeal Phase Pharyngeal Phase Impaired Pharyngeal- Nectar Teaspoon NT Pharyngeal- Nectar Cup Delayed swallow initiation-pyriform sinuses;Penetration/Aspiration during swallow Pharyngeal Material enters airway, remains ABOVE vocal cords then ejected out Pharyngeal- Thin Teaspoon Delayed swallow initiation-pyriform sinuses;Reduced airway/laryngeal closure;Penetration/Aspiration during swallow Pharyngeal Material enters airway, remains ABOVE  vocal cords and not ejected out Pharyngeal- Thin Cup Delayed swallow initiation-pyriform  sinuses;Penetration/Aspiration during swallow;Penetration/Aspiration before swallow;Reduced airway/laryngeal closure Pharyngeal Material enters airway, CONTACTS cords and not ejected out;Material enters airway, passes BELOW cords without attempt by patient to eject out (silent aspiration);Material enters airway, remains ABOVE vocal cords and not ejected out Pharyngeal- Thin Straw Delayed swallow initiation-pyriform sinuses;Reduced airway/laryngeal closure;Penetration/Aspiration during swallow Pharyngeal Material enters airway, CONTACTS cords and not ejected out Pharyngeal- Puree [facility] Pharyngeal Material does not enter airway Pharyngeal- Regular [facility] Pharyngeal Material does not enter airway    03/02/2022   4:52 PM Cervical Esophageal Phase  Cervical Esophageal Phase [facility] Charna Elizabeth M.S., CCC-SLP Brianne T Garretson 03/02/2022, 4:56 PM                     No results for input(s): "WBC", "HGB", "HCT", "PLT" in the last 72 hours.   No results for input(s): "NA", "K", "CL", "CO2", "GLUCOSE", "BUN", "CREATININE", "CALCIUM" in the last 72 hours.    Intake/Output Summary (Last 24 hours) at 03/04/2022 0833 Last data filed at 03/04/2022 0730 Gross per 24 hour  Intake 354 ml  Output 1250 ml  Net -896 ml         Physical Exam: Vital Signs Blood pressure 113/67, pulse 78, temperature 98.2 F (36.8 C), temperature source Oral, resp. rate 16, weight 69.2 kg, SpO2 97 %.   General: No acute distress Mood and affect are appropriate Heart: Regular rate and rhythm no rubs murmurs or extra sounds Lungs: Clear to auscultation, breathing unlabored, no rales or wheezes Abdomen: Positive bowel sounds, soft nontender to palpation, nondistended Extremities: No clubbing, cyanosis, or edema Skin: No evidence of breakdown, no evidence of rash   Neurologic: Cranial nerves II through XII intact, motor strength is 5/5 in right and 0/5 deltoid, 2- horz add, 2- bicep, tricep, grip, 2-/5 hip flexor, knee  extensors, 0/5 ankle dorsiflexor and plantar flexor   Musculoskeletal: mild pain with head rotation     Assessment/Plan: 1. Functional deficits which require 3+ hours per day of interdisciplinary therapy in a comprehensive inpatient rehab setting. Physiatrist is providing close team supervision and 24 hour management of active medical problems listed below. Physiatrist and rehab team continue to assess barriers to discharge/monitor patient progress toward functional and medical goals  Care Tool:  Bathing    Body parts bathed by patient: Face, Abdomen, Front perineal area, Chest, Left arm, Left upper leg, Right upper leg   Body parts bathed by helper: Right arm, Left arm, Left lower leg, Buttocks, Right lower leg     Bathing assist Assist Level: Maximal Assistance - Patient 24 - 49%     Upper Body Dressing/Undressing Upper body dressing   What is the patient wearing?: Pull over shirt    Upper body assist Assist Level: Moderate Assistance - Patient 50 - 74%    Lower Body Dressing/Undressing Lower body dressing      What is the patient wearing?: Pants     Lower body assist Assist for lower body dressing: Maximal Assistance - Patient 25 - 49%     Toileting Toileting    Toileting assist Assist for toileting: Dependent - Patient 0%     Transfers Chair/bed transfer  Transfers assist  Chair/bed transfer activity did not occur: Safety/medical concerns  Chair/bed transfer assist level: Maximal Assistance - Patient 25 - 49%     Locomotion Ambulation   Ambulation assist   Ambulation activity did not occur: Safety/medical concerns  Max distance: Tilt in space wc   Walk 10 feet activity   Assist  Walk 10 feet activity did not occur: Safety/medical concerns        Walk 50 feet activity   Assist Walk 50 feet with 2 turns activity did not occur: Safety/medical concerns         Walk 150 feet activity   Assist Walk 150 feet activity did not  occur: Safety/medical concerns         Walk 10 feet on uneven surface  activity   Assist Walk 10 feet on uneven surfaces activity did not occur: Safety/medical concerns         Wheelchair     Assist Is the patient using a wheelchair?: Yes Type of Wheelchair: Manual    Wheelchair assist level: Dependent - Patient 0% Max wheelchair distance: 166f in TIS    Wheelchair 50 feet with 2 turns activity    Assist        Assist Level: Dependent - Patient 0%   Wheelchair 150 feet activity     Assist      Assist Level: Dependent - Patient 0%   Blood pressure 113/67, pulse 78, temperature 98.2 F (36.8 C), temperature source Oral, resp. rate 16, weight 69.2 kg, SpO2 97 %.  Medical Problem List and Plan: 1. Functional deficits secondary to   Right MCA infarct s/p TNK and IR with TICI2c, R ICA stent with left hemiplegia             -patient may  shower             -ELOS/Goals: 12-14 days             -plan is for SNF post d/c, Team conference today please see physician documentation under team conference tab, met with team  to discuss problems,progress, and goals. Formulized individual treatment plan based on medical history, underlying problem and comorbidities.               -PRAFO ordered            dc amantadine, no issues with arousal , may be causing side  effects   Con't CIR- PT, and OT and SLP 2.  Antithrombotics: -DVT/anticoagulation:  Pharmaceutical: Lovenox             -antiplatelet therapy: ASA/Brilinta.  3. Headache: scheduled tylenol 6532mQID. Also on topirimate 10019mID- improving  Also has post CVA left shoulder pain ,cont PT, OT, may benefit from FES to Left shoulder - pt states she tried this yesterday  4. Mood/Behavior/Sleep: LCSW to follow for evaluation and support.              -antipsychotic agents: N/A 5. Neuropsych/cognition: This patient may not be fully capable of making decisions on her own behalf. 6. Skin/Wound Care: Routine  pressure relief measures, small ant delt sub cut cyst, does not appear inflammed likely chronic 7. Fluids/Electrolytes/Nutrition: Monitor I/O. Poor intake should improve if fluids can be upgraded to thins   10/24 MBS this am  8. CAS s/p stent: On ASA/Brilinta 9. HTN: Monitor BP TID--BP fluctuates, continue Losartan 31m56m0/16: BP up to 189/52 cont amlodipine 5mg 2mals:   03/03/22 1951 03/04/22 0425  BP: 128/88 113/67  Pulse: 77 78  Resp: 14 16  Temp: 97.9 F (36.6 C) 98.2 F (36.8 C)  SpO2: 99% 97%  Good control 10/26   10. Leucocytosis/UTI: Treated with rocephin. 11. AKI: Improved with IVF on board-->d/c and encourage fluid  intake.     Latest Ref Rng & Units 03/01/2022    5:12 AM 02/22/2022    5:50 AM 02/17/2022    8:00 AM  BMP  Glucose 70 - 99 mg/dL 106  107  109   BUN 8 - 23 mg/dL '24  20  24   ' Creatinine 0.44 - 1.00 mg/dL 0.97  1.05  1.09   Sodium 135 - 145 mmol/L 140  137  138   Potassium 3.5 - 5.1 mmol/L 3.5  3.5  3.7   Chloride 98 - 111 mmol/L 114  107  110   CO2 22 - 32 mmol/L '18  19  19   ' Calcium 8.9 - 10.3 mg/dL 9.1  9.7  9.0   If BUN climbs above 30 will restart nocturnal IVF  10/22- labs in AM 12. Dysphagia: Continue D3, honey thick liquids. Will d/c IVF and monitor hydration status.             --encourage water protocol,   10/23- on D3 nectar thick liquids- MBS no changes 13. Urinary retention will cont flomax, I/O caths, UC + enterococcus, f/u sensitivities, discussed with pharmacy, 1 dose fosfomycin recommended received on 10/15, will trial low dose urecholine increae to 76m, may need foley for SNF with Uro f/u, recheck UA pt c/o suprapubic pain 14. Headache- unchanged with reduced dose of topamax , will reduce to 262m             -Continue Topamax but reduce to 5040mand  off Fioricet   15. HLD             -Continue crestor 16. COPD             -Albuterol PRN 17. Hypothyroidism             -Continue synthroid 18. Low back pain: encouraged use of  heating pad.     20.  Incont of bowel and bladder poor awareness and mobility issues contributing , UA + , will get culture    LOS: 16 days A FACE TO FACE EVALUATION WAS PERFORMED  AndCharlett Blake/26/2023, 8:33 AM

## 2022-03-04 NOTE — Progress Notes (Signed)
Physical Therapy Discharge Summary  Patient Details  Name: Julia Deleon MRN: [id number] Date of Birth: [birth year]  Date of Discharge from PT service:March 04, 2022  Today's Date: 03/04/2022 PT Individual Time: 1530-1600   30 min    Patient has met 4 of 10 long term goals due to improved activity tolerance, improved balance, improved postural control, increased strength, ability to compensate for deficits, functional use of  left lower extremity, improved attention, and improved awareness.  Patient to discharge at a wheelchair level [place] -max assist for transfer to to and From [facility] due to pushers syndrome.   Patient's care partner unavailable to provide the necessary physical and cognitive assistance at discharge.  Reasons goals not met: pt limited by pushers syndrome and change in d/c to SNF   Recommendation:  Patient will benefit from ongoing skilled PT services in skilled nursing facility setting to continue to advance safe functional mobility, address ongoing impairments in balance, awareness, coordination, attention, transfers, bed mobility, strength, and minimize fall risk.  Equipment: No equipment provided  Reasons for discharge: discharge from hospital  Patient/family agrees with progress made and goals achieved: Yes  PT treatment:  Pt received supine in bed and agreeable to PT. Supine>sit transfer with min-mod assist for control of LLE and trunk only. PT instructed pt in Grad day assessment to measure progress toward goals. See below for details. CARETool mobility assessment  also completed; see CAREtool tab in navigator for details. Sitting balance EOm with min assist throughout session. PT performed MMT and lateral scooting OEB to the R and L with min assist to the L and mod-max assist to R with cues for weight shift to prevent pushers syndrome to the L. Sit<>stand with mod assist from PT and then max assist to maintain hip/knee extension on the LLE. Performed stepping task x  2 BLE with max assist to block the LLE and facilitate improved erect posture and trunkal rotation to the R. Sit>supine completed with Min assist for LLE control, and left supine in bed with call bell in reach and all needs met.       PT Discharge Precautions/Restrictions   Fall. Pusher syndrome Pain   Denies at rest General:  Missed time: 45 min. Pt with planned d/c to SNF and fatigue limiting participation   Pain Interference Pain Interference Pain Effect on Sleep: 4. Almost constantly Pain Interference with Therapy Activities: 3. Frequently Pain Interference with Day-to-Day Activities: 4. Almost constantly Vision/Perception  Vision - History Ability to See in Adequate Light: 1 Impaired Vision - Assessment Eye Alignment: Within Functional Limits Ocular Range of Motion: Restricted on the left Alignment/Gaze Preference: Head turned;Gaze right Tracking/Visual Pursuits: Decreased smoothness of horizontal tracking Saccades: Additional eye shifts occurred during testing Convergence: Within functional limits Perception Perception: Impaired Inattention/Neglect: Does not attend to left side of body;Does not attend to left visual field Praxis Praxis: Impaired Praxis Impairment Details: Initiation;Motor planning  Cognition Overall Cognitive Status: Impaired/Different from baseline Arousal/Alertness: Awake/alert Year: 2023 Month: October Attention: Focused;Sustained Focused Attention: Impaired Focused Attention Impairment: Verbal basic;Verbal complex Sustained Attention: Impaired Sustained Attention Impairment: Verbal basic Memory: Impaired Memory Impairment: Storage deficit;Decreased recall of new information;Retrieval deficit Decreased Short Term Memory: Verbal basic;Functional basic Awareness: Impaired Awareness Impairment: Intellectual impairment;Emergent impairment Problem Solving: Impaired Problem Solving Impairment: Functional basic;Functional complex Executive Function:  Sequencing;Self Correcting Reasoning: Impaired Reasoning Impairment: Verbal basic;Functional basic Sequencing: Impaired Sequencing Impairment: Functional basic;Verbal basic Organizing: Impaired Organizing Impairment: Verbal basic;Functional basic Self Monitoring: Impaired Self Monitoring Impairment: Verbal  basic;Functional basic Self Correcting: Impaired Self Correcting Impairment: Verbal basic;Functional basic Behaviors: Restless;Perseveration Safety/Judgment: Impaired Sensation Sensation Light Touch: Impaired Detail Light Touch Impaired Details: Impaired LLE;Impaired LUE Hot/Cold: Appears Intact Proprioception: Impaired Detail Proprioception Impaired Details: Impaired LLE;Impaired LUE Stereognosis: Not tested Coordination Gross Motor Movements are Fluid and Coordinated: No Fine Motor Movements are Fluid and Coordinated: No Coordination and Movement Description: dense hemiplegia on the LUE/LLE. improved on the LLE since EVAL Heel Shin Test: unable to perform on the L Motor  Motor Motor: Hemiplegia Motor - Discharge Observations: dense hemiplegia LLE & LUE. imrpoved strength in the LLE  Mobility Bed Mobility Bed Mobility: Rolling Right;Rolling Left;Left Sidelying to Sit;Right Sidelying to Sit;Supine to Sit;Sitting - Scoot to Edge of Bed;Sit to Supine Rolling Right: Minimal Assistance - Patient > 75% Rolling Left: Minimal Assistance - Patient > 75% Supine to Sit: Minimal Assistance - Patient > 75%;Moderate Assistance - Patient 50-74% Sit to Supine: Minimal Assistance - Patient > 75%;Moderate Assistance - Patient 50-74% (control of the LLE only) Transfers Transfers: Set designer Transfers;Lateral/Scoot Transfers Stand Pivot Transfers: Moderate Assistance - Patient 50 - 74% Squat Pivot Transfers: Moderate Assistance - Patient 50-74% Lateral/Scoot Transfers: Moderate Assistance - Patient 50-74% Locomotion  Gait Ambulation: Yes Gait Assistance: 2 Helpers;Maximal Assistance -  Patient 25-49% Gait Distance (Feet): 12 Feet Assistive device: Other (Comment) (rail in hall) Gait Gait: Yes Gait Pattern: Impaired Gait Pattern: Lateral trunk lean to left;Left flexed knee in stance;Abducted- right;Poor foot clearance - left;Decreased step length - left Stairs / Additional Locomotion Stairs: No Wheelchair Mobility Wheelchair Mobility: Yes Wheelchair Assistance: Total Assistance - Patient <25% Wheelchair Propulsion: Right upper extremity Distance: 169f in TIS WC  Trunk/Postural Assessment  Cervical Assessment Cervical Assessment: Exceptions to [facility]-Intensive ServicesThoracic Assessment Thoracic Assessment: Exceptions to WPlaza Surgery CenterPostural Control Postural Control: Deficits on evaluation Trunk Control: pushers syndrome to the L side Protective Responses: delayed on the L  Balance Static Sitting Balance Static Sitting - Balance Support: Right upper extremity supported Static Sitting - Level of Assistance: 3: Mod assist;4: Min assist Dynamic Sitting Balance Dynamic Sitting - Level of Assistance: 3: Mod assist Dynamic Sitting - Balance Activities: Forward lean/weight shifting Static Standing Balance Static Standing - Level of Assistance: 2: Max assist Dynamic Standing Balance Dynamic Standing - Level of Assistance: 1: +1 Total assist Extremity Assessment      RLE Assessment RLE Assessment: Within Functional Limits General Strength Comments: grossly 4+5 to 5/5 LLE Assessment LLE Assessment: Exceptions to [facility] HospitalGeneral Strength Comments: at least 3-/5 with activation in automatic movements. 2-/5 hip/knee flexion/extension as wella s hip abduction/adduction with MMT.   ALorie Phenix10/27/2023, 10:03 AM

## 2022-03-04 NOTE — Progress Notes (Signed)
Speech Language Pathology Daily Session Note  Patient Details  Name: Julia Deleon MRN: [id number] Date of Birth: 07-30-1945  Today's Date: 03/04/2022 SLP Individual Time: 2863-8177 SLP Individual Time Calculation (min): 45 min  Short Term Goals: Week 2: SLP Short Term Goal 1 (Week 2): STG=LTG due to ELOS  Skilled Therapeutic Interventions: Skilled ST treatment focused on cognitive goals. Pt was greeted semi reclined in bed on arrival. Per nurse report, nurse tried administering a whole pill in applesauce yesterday afternoon per SLP's request and reported pt orally held the pill and was unable to swallow successfully. Pill was eventually orally expelled and crushed. Pt tolerated crushed medications well. Recommend to continue crushed medications at this time. SLP facilitated a working memory and short-term memory task by providing novel information and simultaneously answering corresponding questions. Pt sustained attention to task for 10 minute duration with min A verbal cues progressing to mod A verbal cues toward end of task due to increasing internal distractibility, perseveration, and likely cognitive fatigue. Pt recalled up to 73% of information through immediate recall independent of cues, progressing to 80% given min-to-mod A verbal cues (semantic, field of choices). Following 6 minute delay, pt was only able to recall 06% (1/15) of information at independent level progressing to 46% given mod-to-max A verbal cues. Patient was left in bed with alarm activated and immediate needs within reach at end of session. Continue per current plan of care.      Pain Pain Assessment Pain Scale: 0-10 Pain Score: 8   Therapy/Group: Individual Therapy  Patty Sermons 03/04/2022, 8:59 AM

## 2022-03-04 NOTE — Progress Notes (Addendum)
Patient ID: Julia Deleon, female   DOB: 07/04/1945, 76 y.o.   MRN: [id number]  SW spoke with [place], Ad at [facility]. AD will review patient referral. Sw will follow up.   Spouse touring [facility] today. Spouse will follow up with SW after tour on decision.

## 2022-03-04 NOTE — Discharge Instructions (Signed)
Inpatient Rehab Discharge Instructions  Julia Deleon Discharge date and time:  03/04/22  Activities/Precautions/ Functional Status: Activity: activity as tolerated Diet: cardiac diet nectar thick liquids Wound Care: none needed   Functional status:  ___ No restrictions     ___ Walk up steps independently _X__ 24/7 supervision/assistance   ___ Walk up steps with assistance ___ Intermittent supervision/assistance  ___ Bathe/dress independently ___ Walk with walker     ___ Bathe/dress with assistance ___ Walk Independently    ___ Shower independently ___ Walk with assistance    ___ Shower with assistance ___ No alcohol     ___ Return to work/school ________  Special Instructions:  STROKE/TIA DISCHARGE INSTRUCTIONS SMOKING Cigarette smoking nearly doubles your risk of having a stroke & is the single most alterable risk factor  If you smoke or have smoked in the last 12 months, you are advised to quit smoking for your health. Most of the excess cardiovascular risk related to smoking disappears within a year of stopping. Ask you doctor about anti-smoking medications [place] Quit Line: 1-800-QUIT NOW Free Smoking Cessation Classes [phone]  CHOLESTEROL Know your levels; limit fat & cholesterol in your diet  Lipid Panel     Component Value Date/Time   CHOL 169 01/30/2022 0639   CHOL 211 (H) 04/13/2021 0856   CHOL 213 (H) 10/17/2017 0937   TRIG 96 01/30/2022 0639   TRIG 110 10/17/2017 0937   HDL 25 (L) 01/30/2022 0639   HDL 41 04/13/2021 0856   CHOLHDL 6.8 01/30/2022 0639   VLDL 19 01/30/2022 0639   VLDL 22 10/17/2017 0937   LDLCALC 125 (H) 01/30/2022 0639   LDLCALC 149 (H) 04/13/2021 0856     Many patients benefit from treatment even if their cholesterol is at goal. Goal: Total Cholesterol (CHOL) less than 160 Goal:  Triglycerides (TRIG) less than 150 Goal:  HDL greater than 40 Goal:  LDL (LDLCALC) less than 100   BLOOD PRESSURE American Stroke Association blood pressure  target is less that 120/80 mm/Hg  Your discharge blood pressure is:  BP: 113/67 Monitor your blood pressure Limit your salt and alcohol intake Many individuals will require more than one medication for high blood pressure  DIABETES (A1c is a blood sugar average for last 3 months) Goal HGBA1c is under 7% (HBGA1c is blood sugar average for last 3 months)  Diabetes: No known diagnosis of diabetes    Lab Results  Component Value Date   HGBA1C 5.4 01/29/2022    Your HGBA1c can be lowered with medications, healthy diet, and exercise. Check your blood sugar as directed by your physician Call your physician if you experience unexplained or low blood sugars.  PHYSICAL ACTIVITY/REHABILITATION Goal is 30 minutes at least 4 days per week  Activity: No driving, Therapies:  Return to work: N/A Activity decreases your risk of heart attack and stroke and makes your heart stronger.  It helps control your weight and blood pressure; helps you relax and can improve your mood. Participate in a regular exercise program. Talk with your doctor about the best form of exercise for you (dancing, walking, swimming, cycling).  DIET/WEIGHT Goal is to maintain a healthy weight  Your discharge diet is:  Diet Order             Diet regular Room service appropriate? Yes; Fluid consistency: Nectar Thick  Diet effective now                   liquids Your height  is:  5'4" Your current weight is: Weight: 69.2 kg Your Body Mass Index (BMI) is:  BMI (Calculated): 26.55 Following the type of diet specifically designed for you will help prevent another stroke. You are at goal weight   Your goal Body Mass Index (BMI) is 19-24. Healthy food habits can help reduce 3 risk factors for stroke:  High cholesterol, hypertension, and excess weight.  RESOURCES Stroke/Support Group:  Call [phone]   STROKE EDUCATION PROVIDED/REVIEWED AND GIVEN TO PATIENT Stroke warning signs and symptoms How to activate emergency medical  system (call 911). Medications prescribed at discharge. Need for follow-up after discharge. Personal risk factors for stroke. Pneumonia vaccine given:  Flu vaccine given:  My questions have been answered, the writing is legible, and I understand these instructions.  I will adhere to these goals & educational materials that have been provided to me after my discharge from the hospital.      My questions have been answered and I understand these instructions. I will adhere to these goals and the provided educational materials after my discharge from the hospital.  Patient/Caregiver Signature _______________________________ Date __________  Clinician Signature _______________________________________ Date __________  Please bring this form and your medication list with you to all your follow-up doctor's appointments.

## 2022-03-04 NOTE — Progress Notes (Addendum)
Patient ID: Julia Deleon, female   DOB: 14-Jul-1945, 76 y.o.   MRN: [id number]  Patient spouse would like SW to reach out to [facility] AD about bed availability.   VM left for [place], AD. In reference to turing and bed availability.

## 2022-03-04 NOTE — Progress Notes (Signed)
Inpatient Rehabilitation Care Coordinator Discharge Note   Patient Details  Name: Julia Deleon MRN: [id number] Date of Birth: [birth year]   Discharge location: SNF ([facility])  Length of Stay: 16 Days  Discharge activity level: MOD/MAX  Home/community participation: Spouse, Sister and friends  Patient response BP:JPETKK Literacy - How often do you need to have someone help you when you read instructions, pamphlets, or other written material from your doctor or pharmacy?: Never  Patient response OE:CXFQHK Isolation - How often do you feel lonely or isolated from those around you?: Rarely  Services provided included: MD, RD, PT, OT, SLP, RN, CM, TR, Pharmacy, Neuropsych, SW  Financial Services:  Charity fundraiser Utilized: Medicare    Choices offered to/list presented to: patient, spouse and sister  Follow-up services arranged:              Patient response to transportation need: Is the patient able to respond to transportation needs?: Yes In the past 12 months, has lack of transportation kept you from medical appointments or from getting medications?: No In the past 12 months, has lack of transportation kept you from meetings, work, or from getting things needed for daily living?: No    Comments (or additional information):  Patient/Family verbalized understanding of follow-up arrangements:  Yes  Individual responsible for coordination of the follow-up plan: Elenore Rota [phone]  Confirmed correct DME delivered: [name] 03/04/2022    [name]

## 2022-03-04 NOTE — Progress Notes (Signed)
Pt discharged to SNF via Ambulance.     Gladstone Lighter, LPN

## 2022-03-04 NOTE — Progress Notes (Addendum)
Patient ID: Julia Deleon, female   DOB: 16-Apr-1946, 76 y.o.   MRN: [id number]  Sw received notification from patient spouse, Elenore Rota and informed SW their SNF selection is [facility].  SW reached out to facility AD to informed facility of accepted bed offer. Patient will discharge today.  Patient spouse will be present after 5 PM to pick up patient belongings. Packet left at nursing station.  No additional questions or concerns.

## 2022-03-04 NOTE — Discharge Summary (Signed)
Physician Discharge Summary  Patient ID: Jniya C Winkles MRN: [id number] DOB/AGE: 09-11-1945 76 y.o.  Admit date: 02/16/2022 Discharge date: 03/04/2022  Discharge Diagnoses:  Principal Problem:   Acute ischemic right MCA stroke [facility]) Active Problems:   Vaginal atrophy   Spondylolisthesis, lumbar region   Recurrent UTI   Headache   Dysphagia   Urinary retention   Discharged Condition: stable  Significant Diagnostic Studies: DG Swallowing Func-Speech Pathology  Result Date: 03/02/2022 Table formatting from the original result was not included. Objective Swallowing Evaluation: Type of Study: MBS-Modified Barium Swallow Study  Patient Details Name: Feather C Berti MRN: [id number] Date of Birth: 1945-08-30 Today's Date: 03/02/2022 SLP Time Calculation (min) (ACUTE ONLY): 30 min Past Medical History: Past Medical History: Diagnosis Date  Arthritis   Chronic kidney disease   COPD (chronic obstructive pulmonary disease) ([place])   pt denies   Dyspnea   Family history of adverse reaction to anesthesia   father was slow to wake up  Fuch's endothelial dystrophy   GERD (gastroesophageal reflux disease)   Hyperlipidemia   Hypertension   Hypothyroidism   Pneumonia   hx of   PONV (postoperative nausea and vomiting)   slow to wake up and PONV Past Surgical History: Past Surgical History: Procedure Laterality Date  ABDOMINAL HYSTERECTOMY    APPENDECTOMY    BACK SURGERY  09/26/2018  L4-5 PLIF by Dr. Arnoldo Morale  CARDIAC CATHETERIZATION    CHOLECYSTECTOMY    COLONOSCOPY WITH PROPOFOL N/A 10/23/2020  Procedure: COLONOSCOPY WITH PROPOFOL;  Surgeon: Virgel Manifold, MD;  Location: [facility] ENDOSCOPY;  Service: Endoscopy;  Laterality: N/A;  ESOPHAGOGASTRODUODENOSCOPY N/A 10/23/2020  Procedure: ESOPHAGOGASTRODUODENOSCOPY (EGD);  Surgeon: Virgel Manifold, MD;  Location: [facility] ENDOSCOPY;  Service: Endoscopy;  Laterality: N/A;  EYE SURGERY    FOOT SURGERY Right   pin removed left  IR CT HEAD LTD  01/29/2022  IR CT HEAD LTD  01/29/2022   IR CT HEAD LTD  01/29/2022  IR INTRA CRAN STENT  01/29/2022  IR INTRAVSC STENT CERV CAROTID W/O EMB-PROT MOD SED INC ANGIO  01/29/2022  IR PERCUTANEOUS ART THROMBECTOMY/INFUSION INTRACRANIAL INC DIAG ANGIO  01/29/2022  IR US GUIDE VASC ACCESS RIGHT  01/29/2022  laser vein surgery    NASAL SINUS SURGERY    RADIOLOGY WITH ANESTHESIA N/A 01/29/2022  Procedure: IR WITH ANESTHESIA;  Surgeon: Radiologist, Medication, MD;  Location: [place];  Service: Radiology;  Laterality: N/A;  RIGHT/LEFT HEART CATH AND CORONARY ANGIOGRAPHY N/A 12/06/2017  Procedure: RIGHT/LEFT HEART CATH AND CORONARY ANGIOGRAPHY;  Surgeon: Yolonda Kida, MD;  Location: [place] CV LAB;  Service: Cardiovascular;  Laterality: N/A;  ROBOTIC ASSISTED LAPAROSCOPIC SACROCOLPOPEXY Bilateral 07/30/2020  Procedure: XI ROBOTIC ASSISTED LAPAROSCOPIC SACROCOLPOPEXY AND SUPRACERVICAL HYSTERECTOMY WITH BILATERAL SALPINGO OOPHERECTOMY;  Surgeon: Ardis Hughs, MD;  Location: [facility] ORS;  Service: Urology;  Laterality: Bilateral;  REQUESTING 4 HRS  TONSILLECTOMY   HPI: 76 year old female patient who was admitted on 9/22 for acute onset left-sided weakness, left facial droop, dysarthria and neglect. CT angiogram of head and neck showed acute occlusion of proximal right M1 and nonopacification of right M2/M3, severe stenosis at right ICA. Pt now s/p TNK and thrombectomy. Bradycardia and hypotention noted post op.Pt underwent a BSE and MBS - diet was started as dys3/honey thick.  Follow up indicated for dysphagia management and cognitive linguistic treatment.  Subjective: fully alert, significant left inattention  Recommendations for follow up therapy are one component of a multi-disciplinary discharge planning process, led by the attending physician.  Recommendations  may be updated based on patient status, additional functional criteria and insurance authorization. Assessment / Plan / Recommendation   03/02/2022   4:52 PM Clinical Impressions Clinical Impression Pt  presents with a mild oropharyngeal dysphagia with findings comparable to MBS completed on 02/03/22. Pt continues to exhibit delayed swallow initiation, incomplete laryngeal closure, reduced tongue base retraction, and inconsistent sensation to penetration/aspiration events (improved from initial study where penetration of nectar thick and thin liquids were primarily silent in nature). Pt appeared to sense penetration events with nectar thick liquids today. Majority of penetration was spontaneously ejected from laryngeal vestibule with secondary swallow or throat clear. There were no aspiration events observed under fluoro with nectar thick liquids administered by cup and straw. Pt exhibited consistent penetration with almost every presentation of thin liquids which was inconsistently sensed. Pt had silent aspiration with thin liquids by cup x1 which appeared unable to be fully ejected from laryngeal vestibule despite cough/throat clear attempts. There was no penetration or aspiration of puree, regular solid, or mixed consistencies. Oral phase was effective for consumption of regular textures with good pharyngeal clearance. Considering pt remains at risk for silent aspiration of thin liquids, had consistent penetration of thin liquids in which there was inconsistent sensation, evidence of fatigue, and pt has been known to be inconsistent with implementing safe swallowing compensatory strategies due to cognitive deficits impacting recall and awareness, SLP recommends a regular texture diet with nectar thick liquids. Allow straws. Meds may be given whole (one at a time) in applesauce/pudding, or crushed. Pt is appropriate for water protocol (water only) between meals following thorough oral care efforts. Aspiration precautions to include upright positioning, small bites/sips, slow rate of consumption, and discontinue with fatigue. SLP Visit Diagnosis Dysphagia, oropharyngeal phase (R13.12) Impact on safety and function  Mild aspiration risk     03/02/2022   4:52 PM Treatment Recommendations Treatment Recommendations Therapy as outlined in treatment plan below     03/02/2022   4:54 PM Prognosis Prognosis for Safe Diet Advancement Good Barriers to Reach Goals Cognitive deficits   03/02/2022   4:52 PM Diet Recommendations SLP Diet Recommendations Regular solids;Nectar thick liquid Liquid Administration via Cup;Straw Medication Administration Whole meds with puree Compensations Small sips/bites;Slow rate;Minimize environmental distractions Postural Changes Seated upright at 90 degrees     03/02/2022   4:52 PM Other Recommendations Oral Care Recommendations Oral care BID Other Recommendations Prohibited food (jello, ice cream, thin soups);Have oral suction available;Order thickener from pharmacy;Remove water pitcher Follow Up Recommendations Acute inpatient rehab (3hours/day) Assistance recommended at discharge Frequent or constant Supervision/Assistance Functional Status Assessment Patient has had a recent decline in their functional status and demonstrates the ability to make significant improvements in function in a reasonable and predictable amount of time.   02/03/2022  12:20 PM Frequency and Duration  Speech Therapy Frequency (ACUTE ONLY) min 2x/week Treatment Duration 2 weeks     03/02/2022   4:46 PM Oral Phase Oral - Thin Teaspoon Premature spillage;Lingual/palatal residue;Delayed oral transit Oral - Thin Cup Lingual/palatal residue;Premature spillage;Delayed oral transit Oral - Thin Straw Lingual/palatal residue;Decreased bolus cohesion;Premature spillage    03/02/2022   4:47 PM Pharyngeal Phase Pharyngeal Phase Impaired Pharyngeal- Nectar Teaspoon NT Pharyngeal- Nectar Cup Delayed swallow initiation-pyriform sinuses;Penetration/Aspiration during swallow Pharyngeal Material enters airway, remains ABOVE vocal cords then ejected out Pharyngeal- Thin Teaspoon Delayed swallow initiation-pyriform sinuses;Reduced airway/laryngeal  closure;Penetration/Aspiration during swallow Pharyngeal Material enters airway, remains ABOVE vocal cords and not ejected out Pharyngeal- Thin Cup Delayed swallow initiation-pyriform sinuses;Penetration/Aspiration during swallow;Penetration/Aspiration  before swallow;Reduced airway/laryngeal closure Pharyngeal Material enters airway, CONTACTS cords and not ejected out;Material enters airway, passes BELOW cords without attempt by patient to eject out (silent aspiration);Material enters airway, remains ABOVE vocal cords and not ejected out Pharyngeal- Thin Straw Delayed swallow initiation-pyriform sinuses;Reduced airway/laryngeal closure;Penetration/Aspiration during swallow Pharyngeal Material enters airway, CONTACTS cords and not ejected out Pharyngeal- Puree [facility] Pharyngeal Material does not enter airway Pharyngeal- Regular [facility] Pharyngeal Material does not enter airway    03/02/2022   4:52 PM Cervical Esophageal Phase  Cervical Esophageal Phase [facility] Charna Elizabeth M.S., CCC-SLP Brianne T Garretson 03/02/2022, 4:56 PM                     VAS [place] LOWER EXTREMITY VENOUS (DVT)  Result Date: 02/10/2022  Lower Venous DVT Study Patient Name:  NATURI ALARID  Date of Exam:   02/10/2022 Medical Rec #: [id number]    Accession #:    [id number] Date of Birth: Oct 06, 1945     Patient Gender: F Patient Age:   58 years Exam Location:  [facility] Procedure:      VAS [place] LOWER EXTREMITY VENOUS (DVT) Referring Phys: Cornelius Moras XU --------------------------------------------------------------------------------  Indications: Stroke.  Limitations: Poor patient cooperation. Comparison Study: No prior study Performing Technologist: Maudry Mayhew MHA, RDMS, RVT, RDCS  Examination Guidelines: A complete evaluation includes B-mode imaging, spectral Doppler, color Doppler, and power Doppler as needed of all accessible portions of each vessel. Bilateral testing is considered an integral part of a complete examination. Limited  examinations for reoccurring indications may be performed as noted. The reflux portion of the exam is performed with the patient in reverse Trendelenburg.  +---------+---------------+---------+-----------+----------+--------------+ RIGHT    CompressibilityPhasicitySpontaneityPropertiesThrombus Aging +---------+---------------+---------+-----------+----------+--------------+ CFV      Full           Yes      Yes                                 +---------+---------------+---------+-----------+----------+--------------+ SFJ      Full                                                        +---------+---------------+---------+-----------+----------+--------------+ FV Prox  Full                                                        +---------+---------------+---------+-----------+----------+--------------+ FV Mid   Full                                                        +---------+---------------+---------+-----------+----------+--------------+ FV DistalFull                                                        +---------+---------------+---------+-----------+----------+--------------+ PFV  Full                                                        +---------+---------------+---------+-----------+----------+--------------+ POP      Full           Yes      Yes                                 +---------+---------------+---------+-----------+----------+--------------+ PTV      Full                                                        +---------+---------------+---------+-----------+----------+--------------+ PERO     Full                                                        +---------+---------------+---------+-----------+----------+--------------+   +---------+---------------+---------+-----------+----------+--------------+ LEFT     CompressibilityPhasicitySpontaneityPropertiesThrombus Aging  +---------+---------------+---------+-----------+----------+--------------+ CFV      Full           Yes      Yes                                 +---------+---------------+---------+-----------+----------+--------------+ SFJ      Full                                                        +---------+---------------+---------+-----------+----------+--------------+ FV Prox  Full                                                        +---------+---------------+---------+-----------+----------+--------------+ FV Mid   Full                                                        +---------+---------------+---------+-----------+----------+--------------+ FV DistalFull                                                        +---------+---------------+---------+-----------+----------+--------------+ PFV      Full                                                        +---------+---------------+---------+-----------+----------+--------------+  POP      Full           Yes      Yes                                 +---------+---------------+---------+-----------+----------+--------------+ PTV      Full                                                        +---------+---------------+---------+-----------+----------+--------------+ PERO     Full                                                        +---------+---------------+---------+-----------+----------+--------------+     Summary: RIGHT: - There is no evidence of deep vein thrombosis in the lower extremity.  - No cystic structure found in the popliteal fossa.  LEFT: - There is no evidence of deep vein thrombosis in the lower extremity.  - No cystic structure found in the popliteal fossa.  *See table(s) above for measurements and observations. Electronically signed by Harold Barban MD on 02/10/2022 at 10:37:17 PM.    Final    CT HEAD WO CONTRAST (5MM)  Result Date: 02/10/2022 CLINICAL DATA:  Stroke follow-up  EXAM: CT HEAD WITHOUT CONTRAST TECHNIQUE: Contiguous axial images were obtained from the base of the skull through the vertex without intravenous contrast. RADIATION DOSE REDUCTION: This exam was performed according to the departmental dose-optimization program which includes automated exposure control, adjustment of the mA and/or kV according to patient size and/or use of iterative reconstruction technique. COMPARISON:  02/02/2022 FINDINGS: Brain: Evolving infarcts in the right cerebral hemisphere, including in the right inferior frontal gyrus, right basal ganglia and the superior perirolandic cortex. No new hypodensities are visualized to suggest new CT evidence of an infarct. No evidence of hemorrhage. Unchanged size and shape of the ventricular system. No extra-axial fluid collection. Vascular: Redemonstrated are right ICA siphon and right MCA stents. Skull: Normal. Negative for fracture or focal lesion. Sinuses/Orbits: Bilateral lens replacements. No significant sinus disease. Other: Partially visualized nasoenteric tube in place. IMPRESSION: Evolving infarcts in the right cerebral hemisphere. No evidence of hemorrhage or CT evidence of new infarct. Electronically Signed   By: Marin Roberts M.D.   On: 02/10/2022 14:34    Labs:  Basic Metabolic Panel:    Latest Ref Rng & Units 03/01/2022    5:12 AM 02/22/2022    5:50 AM 02/17/2022    8:00 AM  BMP  Glucose 70 - 99 mg/dL 106  107  109   BUN 8 - 23 mg/dL '24  20  24   '$ Creatinine 0.44 - 1.00 mg/dL 0.97  1.05  1.09   Sodium 135 - 145 mmol/L 140  137  138   Potassium 3.5 - 5.1 mmol/L 3.5  3.5  3.7   Chloride 98 - 111 mmol/L 114  107  110   CO2 22 - 32 mmol/L '18  19  19   '$ Calcium 8.9 - 10.3 mg/dL 9.1  9.7  9.0      CBC:    Latest Ref Rng & Units  03/01/2022    5:12 AM 02/22/2022    5:50 AM 02/17/2022    8:00 AM  CBC  WBC 4.0 - 10.5 K/uL 7.0  7.0  9.8   Hemoglobin 12.0 - 15.0 g/dL 12.0  11.8  10.8   Hematocrit 36.0 - 46.0 % 35.3  34.9  33.3    Platelets 150 - 400 K/uL 161  225  297      CBG: Recent Labs  Lab 03/03/22 1133 03/03/22 1633 03/03/22 2145 03/04/22 0605 03/04/22 1202  GLUCAP 111* 147* 118* 118* 119*    Brief HPI:   Natashia C Totherow is a 76 y.o. female with history of CKD, HTN, Fuch's corneal endothelial dystrophy, mild Schatzki's ring, chronic LBP with neurogenic B/B who was admitted on 09/22/2 with left sided weakness, left facial droop, dysarthria and left neglect. She was found to have acute occlusion of proximal M1 non-opacification of right M2/M3 segments, severe stenosis > 75% at origing of R-ICA, high grade narrowing of distal BA stenosis. She received TNKase and underwent mechanical thrombectomy with TIC1 revascularization with stent, contrast staining, flow diverters for [facility] by Dr. Tennis Must. [name]. Post procedure hypotension and bradycardia felt to be due to carotid intervention.    Follow up CT head showed multifocal right hemispheric cytotoxic edema with involvement of right inferior frontal gyrus, right basal ganglia and right superior perirolandic cortex with trace IVH/SAH. She tolerated extubation and was started on D3, nectars. Amantadine was added for activation due to somnolence and Topamax titrated to 100 mg bid to help manage HA. She was treated with 7 day course of IV rocephin for pyuria. She has hx of chronic LBP and neurogencic B/B and required I/O caths briefly but was reported to be voiding continently. Therapy was working with patient who continued to be limited by left hemiplegia, left inattention, depressed mood, back and right shoulder pain, delay in processing requiring verbal and tactile cues for one step commands as well as generalized weakness. CIR was recommended due to functional decline.    Hospital Course: Donata C Kolek was admitted to rehab 02/16/2022 for inpatient therapies to consist of PT, ST and OT at least three hours five days a week. Past admission physiatrist, therapy team and  rehab RN have worked together to provide customized collaborative inpatient rehab. She continues on DAPT for her stent and follow up CBC showed H/H/Plt to be stable.  Her blood pressures were monitored on TID basis and has been controlled on current regimen. She has history of neurogenic bladder and was found to have urinary retention requiring I/O. Caths. She reported dysuria and  was found to have > 100,000 enterococcus in urine on 10/15 and was treated with dose of fosfomycin due to history of allergies to multiple antibiotics. She was afebrile and WBC WNL. She was started on Flomax as well as Urecholine which was increased to 25 mg but she continued to have difficulty voiding. On 10/26, she reported suprapubic pain and UA done showing evidence of UTI. She was started on macrodantin, urine  culture sent and is pending. Foley was placed prior to discharge and recommend foley care BID.   Dysphagia treatment has been ongoing and MBS done on 10/24 revealing inconsistent penetration of thins with silent aspiration therefore was advanced to regular but kept on nectar liquids. IVF were discontinued at admission and she has been encouraged to increase fluid intake. Recent repeat labs showed recurrent pre-renal azotemia and recommend offering fluids between meals to help maintain adequate hydration.  She reported sore throat with myalgias and was negative for Covid 19 per PCR 10/16 and 10/18. Tylenol was scheduled qid manage chronic back pain.   As lethargy has resolved with increase in mentation, amantadine was weaned off. Her headaches are improving and Topamax was tapered to 50 mg bid on 10/25. She continues to require Fioricet on prn basis.     Rehab course: During patient's stay in rehab weekly team conferences were held to monitor patient's progress, set goals and discuss barriers to discharge. At admission, patient required total to max assist with basic self care tasks and total assist with mobility. She  exhibited moderate cognitive deficits with SLUMS score 13/20 as well as dysphagia requiring D3, nectar liquids.  She  has had improvement in activity tolerance, balance, postural control as well as ability to compensate for deficits. She has had improvement in functional use LUE  and LLE as well as improvement in awareness. She requires max assist with bathing, mod assists with UB dressing and Max assist with LB dressing. She requires max assist with SB transfers and able to maintain balance at EOB for 5 mins with min to mod assist. She is able to attend to tasks for 10 minutes with min to  mod cues with internal fatigue and distraction w/perseveration.    Disposition: Skilled Nursing Facility   Diet: Heart Healthy.  Nectar thick liquids  Special Instructions: Offer nectar thick fluids between meals. Recheck BMET in 5-7 days. Medications crushed in puree Water protocol between meals after thorough oral care. May have water only.  Recommend 7 day course of Macrodin for UTI w/pending UCS.   Discharge Instructions     Ambulatory referral to Neurology   Complete by: As directed    An appointment is requested in approximately: 4-6 weeks   Ambulatory referral to Physical Medicine Rehab   Complete by: As directed       Allergies as of 03/04/2022       Reactions   Colesevelam Nausea And Vomiting   WELCHOL   Levofloxacin Hives   Amoxicillin    Benazepril Other (See Comments)   Clavulanic Acid Other (See Comments)   Methylisothiazolinone Other (See Comments)   Positive on allergy test   Thimerosal (thiomersal) Other (See Comments)   Positive on allergy test   Amoxicillin-pot Clavulanate Nausea Only   Did it involve swelling of the face/tongue/throat, SOB, or low BP? No Did it involve sudden or severe rash/hives, skin peeling, or any reaction on the inside of your mouth or nose? No Did you need to seek medical attention at a hospital or doctor's office? Unknown When did it last happen?       unknown If all above answers are "NO", may proceed with cephalosporin use.   Latex Rash   Family history of latex allergy   Simvastatin Itching        Medication List     STOP taking these medications    acetaminophen 650 MG CR tablet Commonly known as: TYLENOL Replaced by: acetaminophen 325 MG tablet   albuterol (2.5 MG/3ML) 0.083% nebulizer solution Commonly known as: PROVENTIL   amantadine 100 MG capsule Commonly known as: SYMMETREL   chlorzoxazone 500 MG tablet Commonly known as: PARAFON   DHEA PO   heparin 5000 UNIT/ML injection   magnesium oxide 400 MG tablet Commonly known as: MAG-OX   pantoprazole sodium 40 mg Commonly known as: PROTONIX   QC TUMERIC COMPLEX PO   terbinafine 250 MG tablet Commonly known as:  LAMISIL   VITAMIN D-3 PO       TAKE these medications    acetaminophen 325 MG tablet Commonly known as: TYLENOL Take 2 tablets (650 mg total) by mouth 4 (four) times daily -  with meals and at bedtime. Replaces: acetaminophen 650 MG CR tablet   ALLEGRA PO Take 1 tablet by mouth daily as needed (allergies).   amLODipine 5 MG tablet Commonly known as: NORVASC Take 1 tablet (5 mg total) by mouth daily. Start taking on: March 05, 2022   aspirin 81 MG chewable tablet Chew 1 tablet (81 mg total) by mouth daily.   azelastine 0.1 % nasal spray Commonly known as: ASTELIN Place 1 spray into both nostrils daily as needed for allergies. Use in each nostril as directed   bethanechol 25 MG tablet Commonly known as: URECHOLINE Take 1 tablet (25 mg total) by mouth 3 (three) times daily.   butalbital-acetaminophen-caffeine 50-325-40 MG tablet--Rx # 10 pills Commonly known as: FIORICET Take 1 tablet by mouth every 12 (twelve) hours as needed for headache.   diclofenac Sodium 1 % Gel Commonly known as: VOLTAREN Apply 2 g topically 3 (three) times daily.   EPINEPHrine 0.3 mg/0.3 mL Soaj injection Commonly known as: EPI-PEN Inject 0.3 mg  into the muscle as needed for anaphylaxis.   estradiol 0.1 MG/GM vaginal cream Commonly known as: ESTRACE Place 0.5 g vaginally See admin instructions. Insert 0.5 g vaginally 2-3 times weekly as needed for vulvovaginal atrophy   food thickener Gel Commonly known as: SIMPLYTHICK (NECTAR/LEVEL 2/MILDLY THICK) Take 1 packet by mouth as needed.   levothyroxine 75 MCG tablet Commonly known as: SYNTHROID Take 1 tablet (75 mcg total) by mouth daily at 6 (six) AM.   loratadine 10 MG tablet Commonly known as: CLARITIN Take 1 tablet (10 mg total) by mouth daily. Start taking on: March 05, 2022   losartan 25 MG tablet Commonly known as: COZAAR Take 1 tablet (25 mg total) by mouth daily. What changed: Another medication with the same name was removed. Continue taking this medication, and follow the directions you see here.   LUTEIN PO Take 1 tablet by mouth daily.   nitrofurantoin (macrocrystal-monohydrate) 100 MG capsule Commonly known as: MACROBID Take 1 capsule (100 mg total) by mouth every 12 (twelve) hours for 13 doses. For 7 days   phenol 1.4 % Liqd Commonly known as: CHLORASEPTIC Use as directed 1 spray in the mouth or throat as needed for throat irritation / pain.   rosuvastatin 20 MG tablet Commonly known as: CRESTOR Take 1 tablet (20 mg total) by mouth daily after supper. What changed: when to take this   senna-docusate 8.6-50 MG tablet Commonly known as: Senokot-S Take 2 tablets by mouth 2 (two) times daily. What changed: how much to take   tamsulosin 0.4 MG Caps capsule Commonly known as: FLOMAX Take 1 capsule (0.4 mg total) by mouth daily.   ticagrelor 90 MG Tabs tablet Commonly known as: BRILINTA Take 1 tablet (90 mg total) by mouth 2 (two) times daily.   topiramate 50 MG tablet Commonly known as: TOPAMAX Take 1 tablet (50 mg total) by mouth 2 (two) times daily.   traZODone 50 MG tablet Commonly known as: DESYREL Take 0.5 tablets (25 mg total) by mouth  at bedtime as needed for sleep.        Contact information for follow-up providers     Idelle Crouch, MD Follow up.   Specialty: Internal Medicine Why: Call in 1-2 days  for post hospital follow up Contact information: [place] [address] [phone]         Pedro Earls, MD Follow up.   Specialties: Radiology, Interventional Radiology Why: Call in 1-2 days for post hospital follow up after stent placement Contact information: [place] [address] [phone]         Charlett Blake, MD Follow up.   Specialty: Physical Medicine and Rehabilitation Why: office will call you with follow up appointment Contact information: [place] [address] [phone]         [facility] Follow up.   Why: office will call you with follow up appointment Contact information: [address]     [address] [place] [address] [phone]             Contact information for after-discharge care     Destination     [facility] Preferred [facility] .   Service: Skilled Nursing Contact information: [address] [place] [phone]                     Signed: Bary Leriche 03/04/2022, 2:30 PM

## 2022-03-04 NOTE — Progress Notes (Signed)
Patient ID: Julia Deleon, female   DOB: 1946/02/25, 76 y.o.   MRN: [id number]  SW waiting on discharge summary to provide SNF before transfer

## 2022-03-04 NOTE — Progress Notes (Signed)
Occupational Therapy Discharge Summary  Patient Details  Name: Julia Deleon MRN: [id number] Date of Birth: [birth year]  Date of Discharge from OT service:February 02, 2022   Patient has met 6 of 9 long term goals due to improved activity tolerance, improved balance, postural control, ability to compensate for deficits, improved attention, and improved awareness.  Patient to discharge at [facility] Max Assist level.  Patient's care partner unavailable to provide the necessary physical and cognitive assistance at discharge.  Family and pt have decided SNF is safest discharge plan for now.  Verbal education with spouse, no hands on training as she will be going to SNF.   Reasons goals not met: pt continues to need mod A with sit to stand vs min A due to severe hemiplegia in LLE and L lean, she is not able to use LUE as a stabilizer due to pain throughout arm and limited tolerance to movement. She also continues to need max A with bathing due to limited balance in sitting.   Recommendation:  Patient will benefit from ongoing skilled OT services in skilled nursing facility setting to continue to advance functional skills in the area of BADL and Reduce care partner burden.  Equipment: No equipment provided  Reasons for discharge: discharge from hospital for SNF placement  Patient/family agrees with progress made and goals achieved: Yes  OT Discharge Precautions/Restrictions  Restrictions Weight Bearing Restrictions: No Fall, left lean in sitting   ADL ADL Eating: Minimal assistance, Moderate cueing Where Assessed-Eating: Wheelchair Grooming: Minimal assistance Where Assessed-Grooming: Wheelchair, Sitting at sink Upper Body Bathing: Moderate assistance Where Assessed-Upper Body Bathing: Shower, Other (Comment) (Tilit in space shower chair) Lower Body Bathing: Maximal assistance Where Assessed-Lower Body Bathing: Shower (Tilt in space shower chair) Upper Body Dressing: Minimal  assistance Where Assessed-Upper Body Dressing: Wheelchair Lower Body Dressing: Maximal assistance Where Assessed-Lower Body Dressing: Wheelchair Toileting: Maximal assistance Where Assessed-Toileting: Other (Comment) (Elevated toilet seat) Toilet Transfer: Maximal assistance Toilet Transfer Method: Engineer, water: Raised toilet seat Social research officer, government: Maximal assistance Social research officer, government Method: Radiographer, therapeutic: Other (comment) (Tilt in space shower chair) Vision Baseline Vision/History: 0 No visual deficits (Visual impairement at baseline) Patient Visual Report: No change from baseline Vision Assessment?: Yes Eye Alignment: Within Functional Limits Ocular Range of Motion: Restricted on the left Alignment/Gaze Preference: Head turned;Gaze right Tracking/Visual Pursuits: Left eye does not track laterally Saccades: Additional eye shifts occurred during testing Convergence: Within functional limits Visual Fields: Left visual field deficit Perception  Perception: Impaired Inattention/Neglect: Does not attend to left side of body;Does not attend to left visual field Praxis Praxis: Impaired Praxis Impairment Details: Initiation;Motor planning Cognition Cognition Overall Cognitive Status: Impaired/Different from baseline Arousal/Alertness: Awake/alert Orientation Level: Person;Situation;Place Memory: Impaired Memory Impairment: Storage deficit;Decreased recall of new information;Retrieval deficit Decreased Short Term Memory: Verbal basic;Functional basic Attention: Focused;Sustained Focused Attention: Impaired Focused Attention Impairment: Verbal basic;Verbal complex Sustained Attention: Impaired Sustained Attention Impairment: Verbal basic Awareness: Impaired Awareness Impairment: Intellectual impairment Problem Solving: Impaired Problem Solving Impairment: Functional basic;Functional complex Executive Function:  Sequencing;Self Correcting Reasoning: Impaired Reasoning Impairment: Verbal basic;Functional basic Sequencing: Impaired Sequencing Impairment: Functional basic;Verbal basic Organizing: Impaired Organizing Impairment: Verbal basic;Functional basic Self Monitoring: Impaired Self Monitoring Impairment: Verbal basic;Functional basic Self Correcting: Impaired Self Correcting Impairment: Verbal basic;Functional basic Safety/Judgment: Impaired Brief Interview for Mental Status (BIMS) Repetition of Three Words (First Attempt): 3 Temporal Orientation: Year: Correct Temporal Orientation: Month: Accurate within 5 days Temporal Orientation: Day: No answer Recall: "Sock": Yes,  no cue required Recall: "Blue": Yes, no cue required Recall: "Bed": No, could not recall BIMS Summary Score: 12 Sensation Sensation Light Touch: Impaired Detail Light Touch Impaired Details: Impaired LLE;Impaired LUE Hot/Cold: Appears Intact Proprioception: Impaired Detail Proprioception Impaired Details: Impaired LLE;Impaired LUE Stereognosis: Not tested Coordination Gross Motor Movements are Fluid and Coordinated: No Fine Motor Movements are Fluid and Coordinated: No Motor  Motor Motor: Hemiplegia Motor - Skilled Clinical Observations: Spasms in LLE, dense hemiplegia LLE & LUE Motor - Discharge Observations: Spasms in LLE, dense hemiplegia LLE & LUE Mobility    Max squat pivot transfers and sit to stands Trunk/Postural Assessment  Cervical AROM Overall Cervical AROM: Deficits;Due to pain Thoracic Strength Overall Thoracic Strength: Due to pain Overall Thoracic Strength Comments: Left sided weakness Postural Control Trunk Control: Poor trunk control with lean towards left side  Balance Static Sitting Balance Static Sitting - Level of Assistance: 3: Mod assist Dynamic Sitting Balance Dynamic Sitting - Level of Assistance: 1: +1 Total assist Static Standing Balance Static Standing - Level of Assistance:  1: +1 Total assist Dynamic Standing Balance Dynamic Standing - Level of Assistance: 1: +2 Total assist Extremity/Trunk Assessment RUE Assessment RUE Assessment: Within Functional Limits LUE Assessment Active Range of Motion (AROM) Comments: 0 - hypotonic with shoulder subluxation Brunstrum level for arm: Stage I Presynergy Brunstrum level for hand: Stage I Flaccidity LUE PROM (degrees) Overall PROM Left Upper Extremity: Within functional limits for tasks assessed LUE Strength LUE Overall Strength Comments: 0/5   Layson Bertsch 03/04/2022, 11:12 PM

## 2022-03-05 NOTE — Plan of Care (Signed)
  Problem: RH Memory Goal: LTG Patient will demonstrate ability for day to day (SLP) Description: LTG:   Patient will demonstrate ability for day to day recall/carryover during cognitive/linguistic activities with assist  (SLP) Outcome: Not Met (add Reason) Goal: LTG Patient will use memory compensatory aids to (SLP) Description: LTG:  Patient will use memory compensatory aids to recall biographical/new, daily complex information with cues (SLP) Outcome: Not Met (add Reason)   Problem: RH Attention Goal: LTG Patient will demonstrate this level of attention during functional activites (SLP) Description: LTG:  Patient will will demonstrate this level of attention during functional activites (SLP) Outcome: Not Met (add Reason)   Problem: RH Swallowing Goal: LTG Patient will consume least restrictive diet using compensatory strategies with assistance (SLP) Description: LTG:  Patient will consume least restrictive diet using compensatory strategies with assistance (SLP) Outcome: Completed/Met Goal: LTG Pt will demonstrate functional change in swallow as evidenced by bedside/clinical objective assessment (SLP) Description: LTG: Patient will demonstrate functional change in swallow as evidenced by bedside/clinical objective assessment (SLP) Outcome: Completed/Met   Problem: RH Problem Solving Goal: LTG Patient will demonstrate problem solving for (SLP) Description: LTG:  Patient will demonstrate problem solving for basic/complex daily situations with cues  (SLP) Outcome: Completed/Met   Problem: RH Awareness Goal: LTG: Patient will demonstrate awareness during functional activites type of (SLP) Description: LTG: Patient will demonstrate awareness during functional activites type of (SLP) Outcome: Completed/Met

## 2022-03-05 NOTE — Progress Notes (Signed)
Late note: Discussed patient's UA results with Dr. Letta Pate, her family and LCSW who will inform SNF to monitor for final culture results over next 48 hours (as they can review our chart in epic). Will also contact them today or Monday as results become available.

## 2022-03-05 NOTE — Progress Notes (Signed)
Speech Language Pathology Discharge Summary  Patient Details  Name: Julia Deleon MRN: [id number] Date of Birth: 07/15/1945  Date of Discharge from SLP service:March 04, 2022  Patient has met 4 of 7 long term goals.  Patient to discharge at overall Mod level.  Reasons goals not met: Severity of deficits with slow progress. Pt often limited by ongoing discomfort and fatigue   Clinical Impression/Discharge Summary: Patient has made slow yet functional gains and has met 4 of 7 long-term goals this admission. Pt is currently at a mod-to-max A level for cognitive-linguistic skills in the areas of sustained attention, short-term recall with use of compensatory aids, basic problem solving, and emergent awareness. Pt is currently consuming a regular texture diet with nectar thick liquids, meds crushed in puree. Per MBS obtained on 03/02/22, pt continues to exhibit silent aspiration to thin liquids. Patient and family education is complete and patient to discharge at overall mod A level. Patient's care partner is unable to currently provide the necessary physical and cognitive assistance at discharge and pt will be discharging to SNF. Patient would benefit from continued SLP services in SNF setting to maximize cognitive-linguistic and swallow function and functional independence.    Care Partner:  Caregiver Able to Provide Assistance: No;Other (comment) (Pt will be discharging to SNF)    Recommendation:  Home Health SLP  Rationale for SLP Follow Up: Maximize cognitive function and independence;Maximize swallowing safety;Reduce caregiver burden   Equipment: None   Reasons for discharge: Discharged from hospital   Patient/Family Agrees with Progress Made and Goals Achieved: Yes    Brianne T Garretson 03/05/2022, 7:39 AM  

## 2022-03-05 NOTE — Plan of Care (Signed)
  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 Outcome: Not Met (add Reason) Note: Pt limited due to continued pushers syndrome with change in d/c to SNF   Problem: RH Wheelchair Mobility Goal: LTG Patient will propel w/c in controlled environment (PT) Description: LTG: Patient will propel wheelchair in controlled environment, # of feet with assist (PT) Outcome: Not Met (add Reason) Note: Pt unable to coordinate movement due to attention deficits  Goal: LTG Patient will propel w/c in home environment (PT) Description: LTG: Patient will propel wheelchair in home environment, # of feet with assistance (PT). Outcome: Not Met (add Reason) Note: Pt unable to coordinate movement due to attention deficits    Problem: Sit to Stand Goal: LTG:  Patient will perform sit to stand with assistance level (PT) Description: LTG:  Patient will perform sit to stand with assistance level (PT) Outcome: Completed/Met   Problem: RH Bed  Mobility Goal: LTG Patient will perform bed mobility with assist (PT) Description: LTG: Patient will perform bed mobility with assistance, with/without cues (PT). Outcome: Completed/Met   Problem: RH Bed to Chair Transfers Goal: LTG Patient will perform bed/chair transfers w/assist (PT) Description: LTG: Patient will perform bed to chair transfers with assistance (PT). Outcome: Completed/Met   Problem: RH Furniture Transfers Goal: LTG Patient will perform furniture transfers w/assist (OT/PT) Description: LTG: Patient will perform furniture transfers  with assistance (OT/PT). Outcome: Completed/Met

## 2022-03-07 LAB — URINE CULTURE: Culture: >=100000 — AB

## 2022-03-12 ENCOUNTER — Telehealth: Payer: Self-pay | Admitting: *Deleted

## 2022-03-12 NOTE — Telephone Encounter (Signed)
Mr Hasler called and reports Julia Deleon is still pretty much bedridden and wants to know if she really needs to come to the 03/16/22 (TC) appt in our office. It would require being brought by ambulance. Her appt is with Zella Ball but she is Dr Letta Pate pt.

## 2022-03-16 ENCOUNTER — Encounter: Payer: Medicare Other | Admitting: Registered Nurse

## 2022-03-23 IMAGING — US US EXTREM LOW VENOUS*L*
1 series · 14 of 24 positions shown · non-contrast
Comparison: None.

CLINICAL DATA: Left leg pain

EXAM:
LEFT LOWER EXTREMITY VENOUS DOPPLER ULTRASOUND
TECHNIQUE: Gray-scale sonography with compression, as well as color and duplex
ultrasound, were performed to evaluate the deep venous system(s)
from the level of the common femoral vein through the popliteal and
proximal calf veins.

[Series 1: us complete joint space structures low left · 14 of 37 slices shown]
[im 1/37]
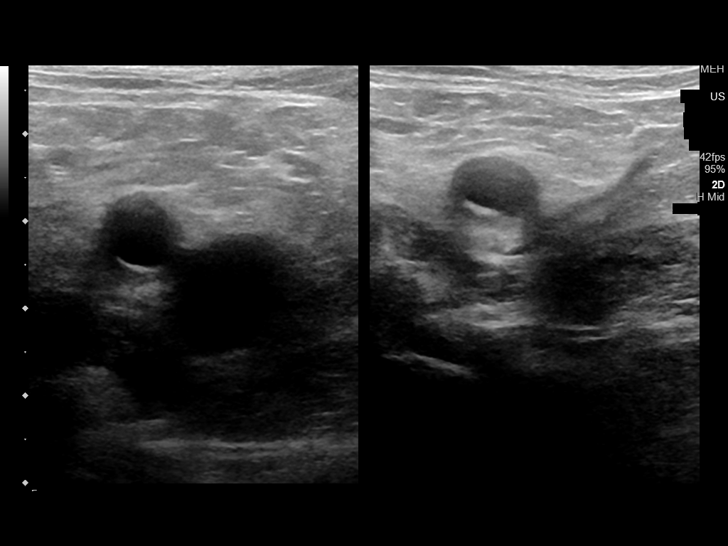
[im 4/37]
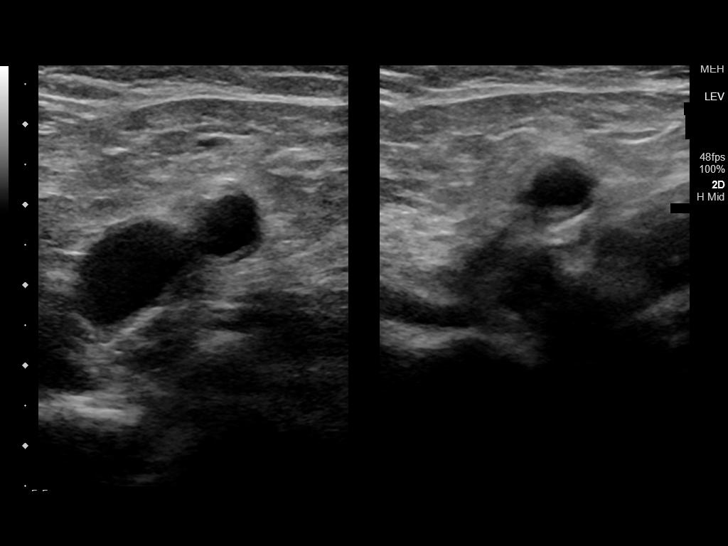
[im 7/37]
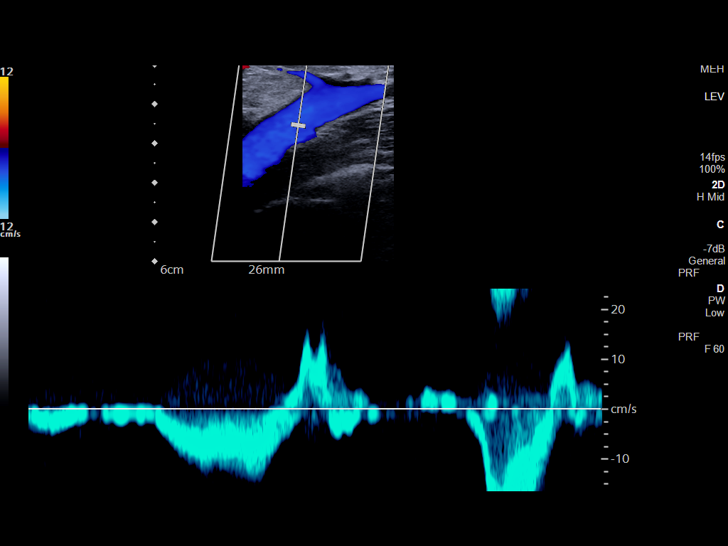
[im 10/37]
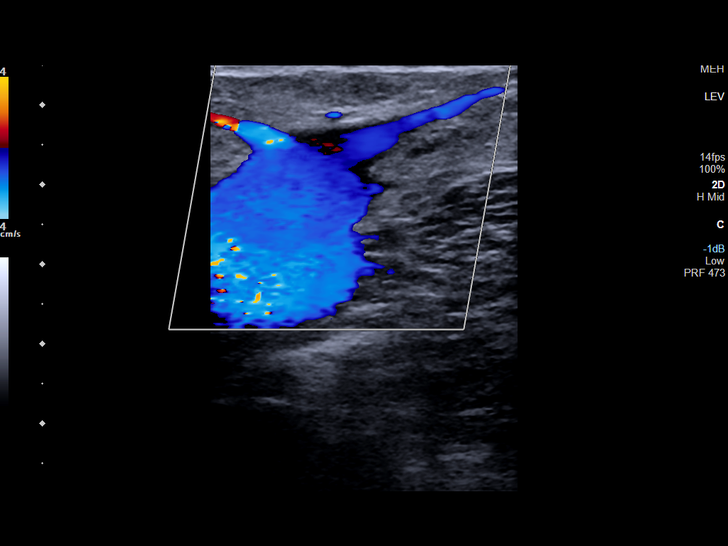
[im 11/37]
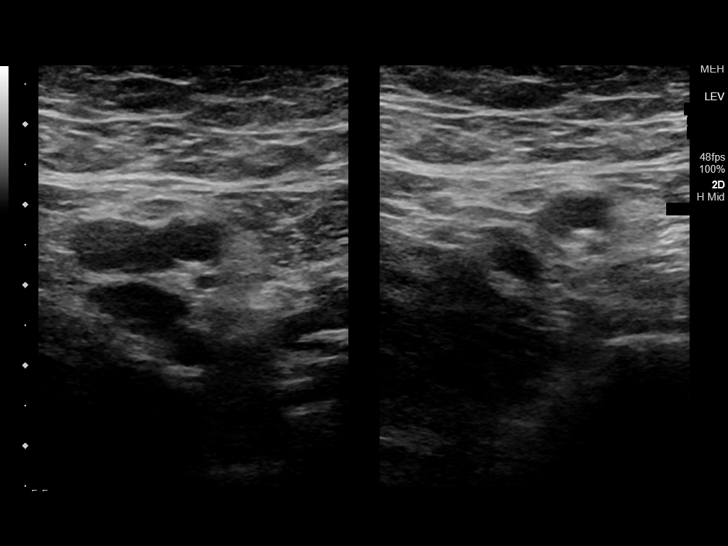
[im 15/37]
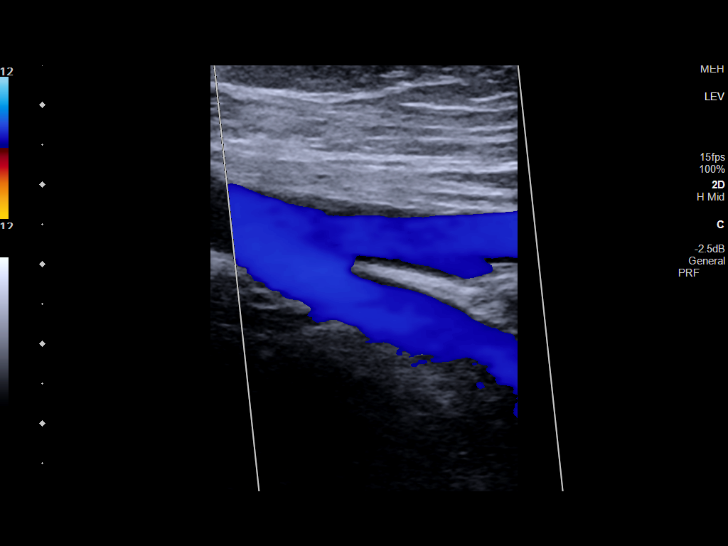
[im 18/37]
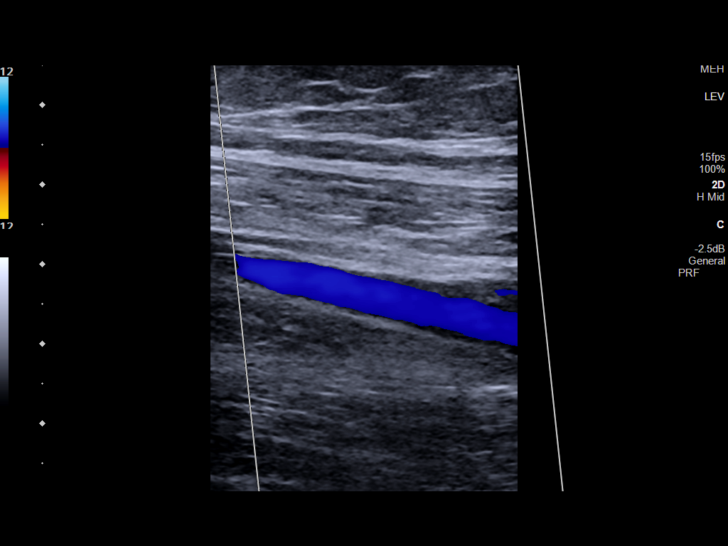
[im 19/37]
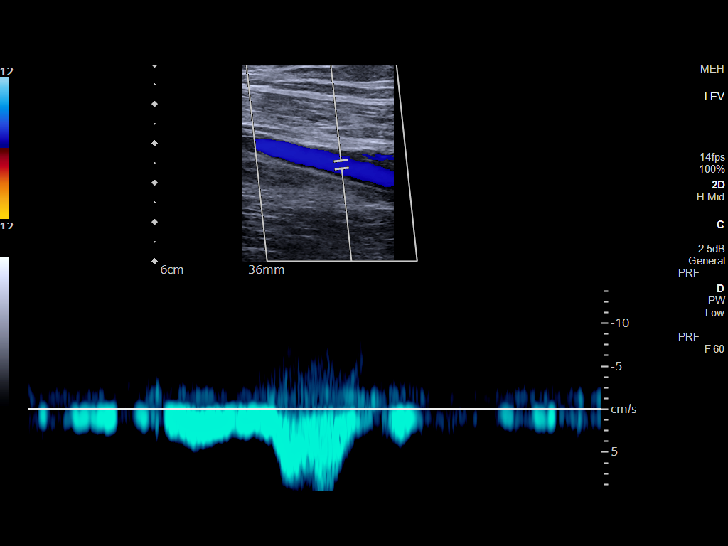
[im 22/37]
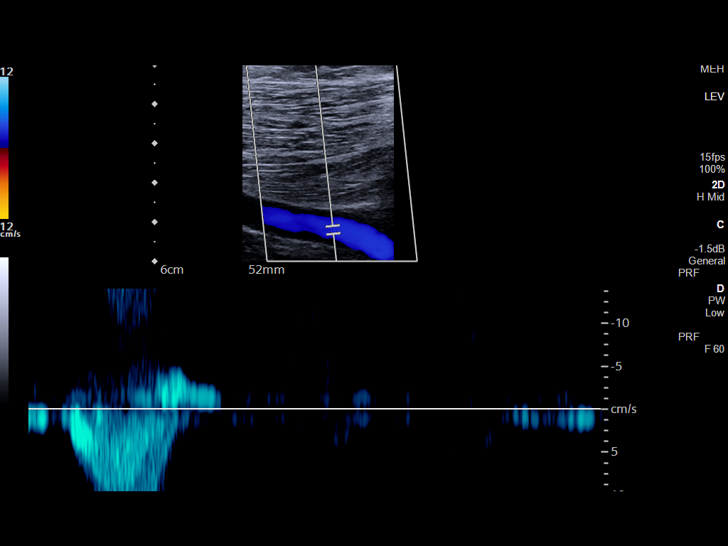
[im 26/37]
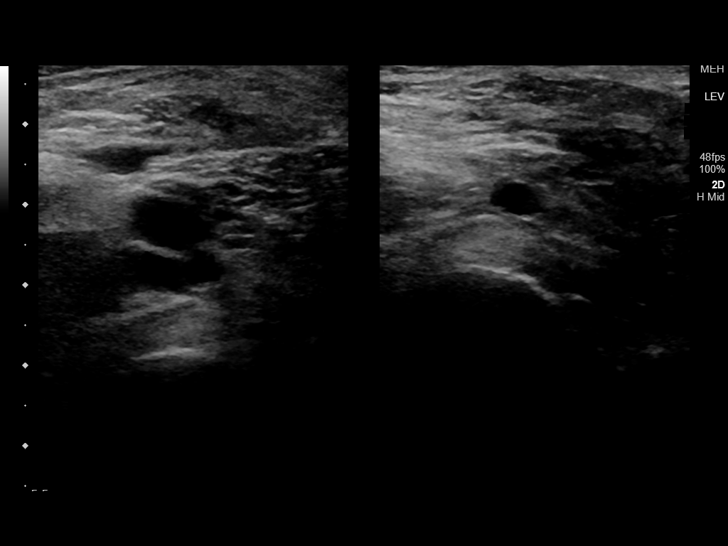
[im 29/37]
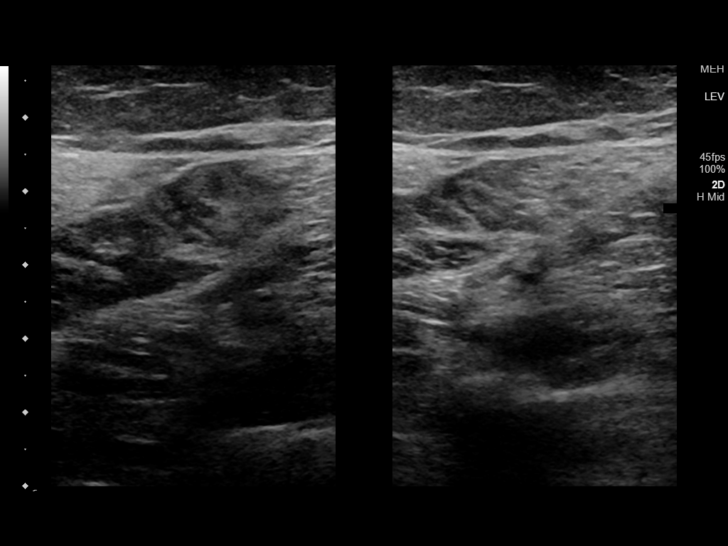
[im 30/37]
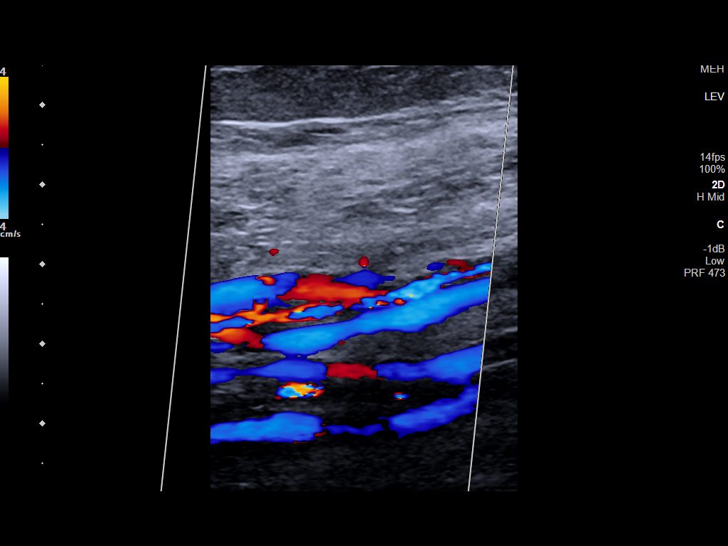
[im 33/37]
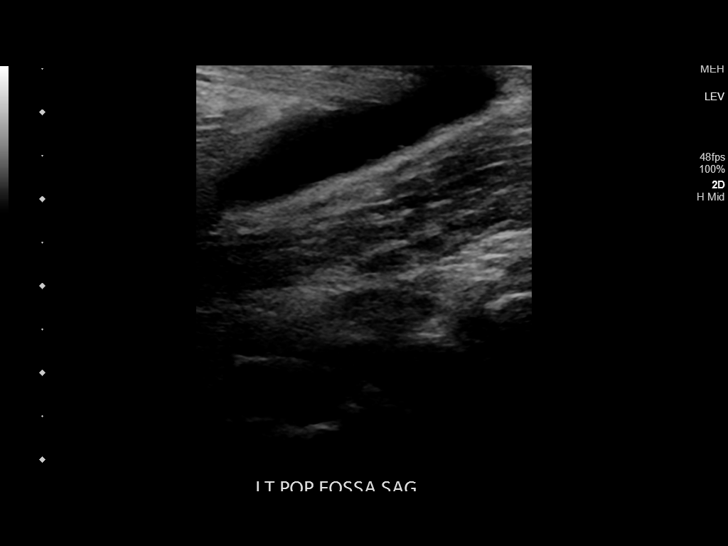
[im 37/37]
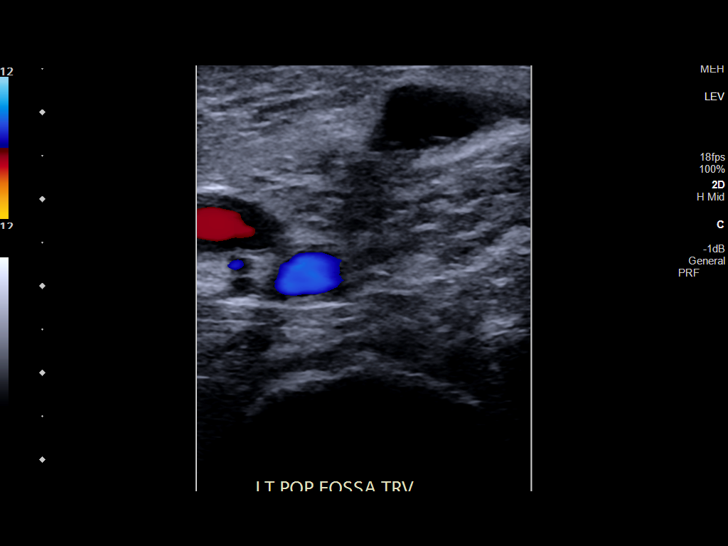

[14 of 24 positions shown; findings below may reference images not displayed]

FINDINGS: VENOUS

Normal compressibility of the common femoral, superficial femoral,
and popliteal veins, as well as the visualized calf veins.
Visualized portions of profunda femoral vein and great saphenous
vein unremarkable. No filling defects to suggest DVT on grayscale or
color Doppler imaging. Doppler waveforms show normal direction of
venous flow, normal respiratory plasticity and response to
augmentation.

Limited views of the contralateral common femoral vein are
unremarkable.

OTHER

3.4 x 3.1 x 0.8 cm cyst in the popliteal fossa.

Limitations: none
IMPRESSION: No evidence of left lower extremity DVT.

Left Baker's cyst.

## 2022-03-23 NOTE — Progress Notes (Signed)
Pt husband came up to unit questioning information on upcoming scheduled visits. Pt husband misplaced discharge instructions. Pt information and pt husband identified as correct person. Discharge instructions re-printed and handed off.  Sheela Stack, LPN

## 2022-03-24 ENCOUNTER — Encounter: Payer: Medicare Other | Attending: Registered Nurse | Admitting: Registered Nurse

## 2022-03-24 ENCOUNTER — Encounter: Payer: Self-pay | Admitting: Registered Nurse

## 2022-03-24 VITALS — Ht 64.0 in

## 2022-03-24 DIAGNOSIS — I10 Essential (primary) hypertension: Secondary | ICD-10-CM | POA: Diagnosis not present

## 2022-03-24 DIAGNOSIS — R339 Retention of urine, unspecified: Secondary | ICD-10-CM

## 2022-03-24 DIAGNOSIS — I639 Cerebral infarction, unspecified: Secondary | ICD-10-CM

## 2022-03-24 NOTE — Progress Notes (Signed)
Subjective:    Patient ID: Julia Deleon, female    DOB: [birth year], 76 y.o.   MRN: [id number]  HPI: Julia Deleon is a 76 y.o. female who is scheduled for My- Chart Video Visit, HFU appointment. Of her Acute Ischemic right MCA Stroke, Essential Hypertension and Urinary Retention. She was brought to  [facility] on 01/29/2022  via EMS for acute onset of left sided weakness, left facial droop, dysarthria and left neglect.  Dr. Cheral Marker H&P Chief Complaint: Acute onset of left sided weakness, left facial droop and dysarthria with left sided neglect   HPI: Julia Deleon is an 76 y.o. female with a PMHx of arthritis, CKD, COPD, Fuch's corneal endothelial dystrophy, HLD, HTN and hypothyroidism who presents to the ED via EMS as a Code Stroke with acute onset of left sided weakness, left facial droop and dysarthria with neglect. Symptom onset was at 28 while she was working out. She was standing up performing an exercise when she felt her left leg give way. She collapsed to the floor and EMS was called. On their arrival, the above symptoms were noted. She was emergently transported to the [facility] ED. On arrival she continued to exhibit the above clinical findings. She has no prior history of stroke.   CT Head: WO Contrast: IMPRESSION: 1. Loss of gray-white differentiation in the posterior right insular cortex and right lentiform nucleus compatible with acute/subacute right MCA infarct. 2. Aspects 8/10 3. No acute hemorrhage. 4. Age indeterminate white matter infarct in the right corona radiata likely chronic. 5. Atherosclerosis.  CTA:  IMPRESSION: 1. Acute occlusion of the proximal right M1 with nonopacification of the right M2/M3 segments and decreased arborization in the right MCA territory, compared to left.   2.  Severe stenosis at the origin of the right ICA (>75%).   3.  High grade narrowing in the distal basilar artery.   4. The distal V4 segment of the right vertebral artery may terminate in  a PICA or be occluded; recommend comparison with prior imaging,if available.  MR Brain:  IMPRESSION: 1. Acute/subacute nonhemorrhagic infarct involving the posterior right parietal cortex. 2. Remote lacunar infarct of the lateral left thalamus. 3. Remote lacunar infarct of the right corona radiata. 4. No acute infarct of the insula or right basal ganglia 5. Mild periventricular T2 hyperintensities bilaterally are mildly advanced for age. This likely reflects the sequela of chronic microvascular ischemia.  She underwent mechanical thrombectomy by Dr Margarita Sermons.   Julia Deleon was admitted to inpatient rehabilitation on 02/16/2022 and discharged to SNF on 03/04/2022. She agrees with My- Chart Video Visit .We have  discussed the limitations of evaluation and management by telemedicine and the availability of in person appointments. The patient expressed understanding and agreed to proceed. She states she has pain in her lower back. She rates her pain 8. She is receiving physical, occupational and speech Therapy at [facility], her husband reports.    Also husband reports she has a poor appetite. She was encouraged to eat her meals, she verbalizes understanding.   Pain Inventory Average Pain 8 Pain Right Now 8 My pain is constant, sharp, burning, and aching  LOCATION OF PAIN  BACK, HAND, SHOULDER, HEAD, ARM, LEG  BOWEL Number of stools per week: 7 Oral laxative use Yes  Type of laxative (Assisted living gives it to me)   BLADDER Pads, Foley    Mobility use a wheelchair needs help with transfers Do you have any goals in  this area?  yes  Function retired I need assistance with the following:  dressing, bathing, toileting, meal prep, household duties, shopping, and lives in an assisted living  Do you have any goals in this area?  yes  Neuro/Psych bladder control problems weakness numbness trouble walking confusion depression anxiety  Prior Studies Any  changes since last visit?  no  Physicians involved in your care Any changes since last visit?  no   Family History  Problem Relation Age of Onset   Cancer Mother        lung   CAD Mother    Stroke Father    Breast cancer Sister        23s   Fibromyalgia Sister    Brain cancer Sister    Heart disease Maternal Grandmother    Cancer Paternal Grandmother        throat   Emphysema Paternal Grandfather    Varicose Veins Son    Breast cancer Other    Social History   Socioeconomic History   Marital status: Married    Spouse name: Not on file   Number of children: Not on file   Years of education: 4   Highest education level: Bachelor's degree (e.g., BA, AB, BS)  Occupational History   Occupation: retired  Tobacco Use   Smoking status: Never   Smokeless tobacco: Never  Vaping Use   Vaping Use: Never used  Substance and Sexual Activity   Alcohol use: No    Alcohol/week: 0.0 standard drinks of alcohol   Drug use: No   Sexual activity: Not Currently    Birth control/protection: Post-menopausal  Other Topics Concern   Not on file  Social History Narrative   Not on file   Social Determinants of Health   Financial Resource Strain: Low Risk  (04/08/2021)   Overall Financial Resource Strain (CARDIA)    Difficulty of Paying Living Expenses: Not hard at all  Food Insecurity: No Food Insecurity (02/15/2022)   Hunger Vital Sign    Worried About Running Out of Food in the Last Year: Never true    Ran Out of Food in the Last Year: Never true  Transportation Needs: No Transportation Needs (02/15/2022)   PRAPARE - Hydrologist (Medical): No    Lack of Transportation (Non-Medical): No  Physical Activity: Insufficiently Active (04/08/2021)   Exercise Vital Sign    Days of Exercise per Week: 4 days    Minutes of Exercise per Session: 30 min  Stress: No Stress Concern Present (04/08/2021)   [place]    Feeling of Stress : Not at all  Social Connections: [place] (04/08/2021)   Social Connection and Isolation Panel [NHANES]    Frequency of Communication with Friends and Family: More than three times a week    Frequency of Social Gatherings with Friends and Family: More than three times a week    Attends Religious Services: More than 4 times per year    Active Member of Clubs or Organizations: Yes    Attends Music therapist: More than 4 times per year    Marital Status: Married   Past Surgical History:  Procedure Laterality Date   ABDOMINAL HYSTERECTOMY     APPENDECTOMY     BACK SURGERY  09/26/2018   L4-5 PLIF by Dr. Arnoldo Morale   CARDIAC CATHETERIZATION     CHOLECYSTECTOMY     COLONOSCOPY WITH PROPOFOL N/A 10/23/2020  Procedure: COLONOSCOPY WITH PROPOFOL;  Surgeon: Virgel Manifold, MD;  Location: [facility] ENDOSCOPY;  Service: Endoscopy;  Laterality: N/A;   ESOPHAGOGASTRODUODENOSCOPY N/A 10/23/2020   Procedure: ESOPHAGOGASTRODUODENOSCOPY (EGD);  Surgeon: Virgel Manifold, MD;  Location: [facility] ENDOSCOPY;  Service: Endoscopy;  Laterality: N/A;   EYE SURGERY     FOOT SURGERY Right    pin removed left   IR CT HEAD LTD  01/29/2022   IR CT HEAD LTD  01/29/2022   IR CT HEAD LTD  01/29/2022   IR INTRA CRAN STENT  01/29/2022   IR INTRAVSC STENT CERV CAROTID W/O EMB-PROT MOD SED INC ANGIO  01/29/2022   IR PERCUTANEOUS ART THROMBECTOMY/INFUSION INTRACRANIAL INC DIAG ANGIO  01/29/2022   IR US GUIDE VASC ACCESS RIGHT  01/29/2022   laser vein surgery     NASAL SINUS SURGERY     RADIOLOGY WITH ANESTHESIA N/A 01/29/2022   Procedure: IR WITH ANESTHESIA;  Surgeon: Radiologist, Medication, MD;  Location: [facility];  Service: Radiology;  Laterality: N/A;   RIGHT/LEFT HEART CATH AND CORONARY ANGIOGRAPHY N/A 12/06/2017   Procedure: RIGHT/LEFT HEART CATH AND CORONARY ANGIOGRAPHY;  Surgeon: Yolonda Kida, MD;  Location: [place] [facility];  Service: Cardiovascular;   Laterality: N/A;   ROBOTIC ASSISTED LAPAROSCOPIC SACROCOLPOPEXY Bilateral 07/30/2020   Procedure: XI ROBOTIC ASSISTED LAPAROSCOPIC SACROCOLPOPEXY AND SUPRACERVICAL HYSTERECTOMY WITH BILATERAL SALPINGO OOPHERECTOMY;  Surgeon: Ardis Hughs, MD;  Location: [facility];  Service: Urology;  Laterality: Bilateral;  REQUESTING 4 HRS   TONSILLECTOMY     Past Medical History:  Diagnosis Date   Arthritis    Chronic kidney disease    COPD (chronic obstructive pulmonary disease) ([place])    pt denies    Dyspnea    Family history of adverse reaction to anesthesia    father was slow to wake up   Fuch's endothelial dystrophy    GERD (gastroesophageal reflux disease)    Hyperlipidemia    Hypertension    Hypothyroidism    Pneumonia    hx of    PONV (postoperative nausea and vomiting)    slow to wake up and PONV   Ht '5\' 4"'$  (1.626 m)   LMP  (LMP Unknown)   BMI 26.19 kg/m   Opioid Risk Score:   Fall Risk Score:  `1  Depression screen [facility] 2/9     04/27/2021   10:26 AM 04/08/2021   10:13 AM 09/24/2020    2:26 PM 04/07/2020    9:50 AM 04/03/2019    8:16 AM 03/30/2018    9:12 AM 06/06/2017   11:06 AM  Depression screen PHQ 2/9  Decreased Interest 0 0 0 0 0 0 0  Down, Depressed, Hopeless 0 0 0 0 0 0 1  PHQ - 2 Score 0 0 0 0 0 0 1  Altered sleeping 0    1    Tired, decreased energy 2    2    Change in appetite 0    0    Feeling bad or failure about yourself  0    0    Trouble concentrating 0    0    Moving slowly or fidgety/restless 0    0    Suicidal thoughts 0    0    PHQ-9 Score 2    3    Difficult doing work/chores Not difficult at all    Not difficult at all      Review of Systems  Constitutional:  Positive for appetite change.  HENT:  Positive for drooling.   Gastrointestinal:  Positive for nausea.  Genitourinary:        Foley placed  Musculoskeletal:  Positive for back pain and gait problem.        On the left side: Pain the arm, pain hand, Leg pain  Neurological:  Positive  for weakness and numbness.  Psychiatric/Behavioral:  Positive for confusion.        Depression, anxiety  All other systems reviewed and are negative.      Objective:   Physical Exam Vitals and nursing note reviewed.  Musculoskeletal:     Comments: No Physical Exam: My- Chart Video Visit         Assessment & Plan:  Acute Ischemic right MCA Stroke: Continue current medication regimen, Therapy at [facility]. She as a scheduled appointment with Neurology, Continue to Monitor.  Essential Hypertension: Continue Current Medication Regimen. Continue to Monitor. Urinary Retention. Continue Current medication regimen. Continue to Monitor.   F/U with Dr Letta Pate in 4- 6 weeks  F/U Appointment  My- Chart Visit Location of Patient: [facility] Location of Provider: In the Office

## 2022-03-31 ENCOUNTER — Ambulatory Visit: Payer: Medicare Other | Admitting: Urology

## 2022-04-05 ENCOUNTER — Ambulatory Visit: Payer: Medicare Other | Admitting: Urology

## 2022-04-05 ENCOUNTER — Ambulatory Visit: Payer: Medicare Other | Admitting: Physician Assistant

## 2022-05-04 ENCOUNTER — Ambulatory Visit (INDEPENDENT_AMBULATORY_CARE_PROVIDER_SITE_OTHER): Payer: Medicare Other | Admitting: Neurology

## 2022-05-04 ENCOUNTER — Ambulatory Visit: Payer: Medicare Other | Admitting: Physical Medicine & Rehabilitation

## 2022-05-04 ENCOUNTER — Telehealth: Payer: Self-pay | Admitting: Neurology

## 2022-05-04 VITALS — BP 133/67

## 2022-05-04 DIAGNOSIS — I69354 Hemiplegia and hemiparesis following cerebral infarction affecting left non-dominant side: Secondary | ICD-10-CM | POA: Diagnosis not present

## 2022-05-04 DIAGNOSIS — R519 Headache, unspecified: Secondary | ICD-10-CM

## 2022-05-04 DIAGNOSIS — I639 Cerebral infarction, unspecified: Secondary | ICD-10-CM | POA: Diagnosis not present

## 2022-05-04 DIAGNOSIS — I63511 Cerebral infarction due to unspecified occlusion or stenosis of right middle cerebral artery: Secondary | ICD-10-CM

## 2022-05-04 DIAGNOSIS — S43005A Unspecified dislocation of left shoulder joint, initial encounter: Secondary | ICD-10-CM

## 2022-05-04 NOTE — Patient Instructions (Signed)
I had a long d/w patient and her husband about her recent stroke,spatic hemiplegia, chronic daily headaches, left shoulder pain and dislocation, risk for recurrent stroke/TIAs, personally independently reviewed imaging studies and stroke evaluation results and answered questions.Continue aspirin 81 mg daily and Brilinta 90 mg twice daily for 6 months and then aspirin alone for secondary stroke prevention and maintain strict control of hypertension with blood pressure goal below 130/90, diabetes with hemoglobin A1c goal below 6.5% and lipids with LDL cholesterol goal below 70 mg/dL. I also advised the patient to eat a healthy diet with plenty of whole grains, cereals, fruits and vegetables, exercise regularly and maintain ideal body weight .increase continue ongoing physical occupational therapies and ambulate as tolerated.  Topamax dose to 100 mg twice daily for chronic daily headaches.  Refer to orthopedics for left shoulder pain and dislocation.  Followup in the future with my nurse practitioner in 3 months or call earlier if necessary.  Stroke Prevention Some medical conditions and behaviors can lead to a higher chance of having a stroke. You can help prevent a stroke by eating healthy, exercising, not smoking, and managing any medical conditions you have. Stroke is a leading cause of functional impairment. Primary prevention is particularly important because a majority of strokes are first-time events. Stroke changes the lives of not only those who experience a stroke but also their family and other caregivers. How can this condition affect me? A stroke is a medical emergency and should be treated right away. A stroke can lead to brain damage and can sometimes be life-threatening. If a person gets medical treatment right away, there is a better chance of surviving and recovering from a stroke. What can increase my risk? The following medical conditions may increase your risk of a stroke: Cardiovascular  disease. High blood pressure (hypertension). Diabetes. High cholesterol. Sickle cell disease. Blood clotting disorders (hypercoagulable state). Obesity. Sleep disorders (obstructive sleep apnea). Other risk factors include: Being older than age 3. Having a history of blood clots, stroke, or mini-stroke (transient ischemic attack, TIA). Genetic factors, such as race, ethnicity, or a family history of stroke. Smoking cigarettes or using other tobacco products. Taking birth control pills, especially if you also use tobacco. Heavy use of alcohol or drugs, especially cocaine and methamphetamine. Physical inactivity. What actions can I take to prevent this? Manage your health conditions High cholesterol levels. Eating a healthy diet is important for preventing high cholesterol. If cholesterol cannot be managed through diet alone, you may need to take medicines. Take any prescribed medicines to control your cholesterol as told by your health care provider. Hypertension. To reduce your risk of stroke, try to keep your blood pressure below 130/80. Eating a healthy diet and exercising regularly are important for controlling blood pressure. If these steps are not enough to manage your blood pressure, you may need to take medicines. Take any prescribed medicines to control hypertension as told by your health care provider. Ask your health care provider if you should monitor your blood pressure at home. Have your blood pressure checked every year, even if your blood pressure is normal. Blood pressure increases with age and some medical conditions. Diabetes. Eating a healthy diet and exercising regularly are important parts of managing your blood sugar (glucose). If your blood sugar cannot be managed through diet and exercise, you may need to take medicines. Take any prescribed medicines to control your diabetes as told by your health care provider. Get evaluated for obstructive sleep apnea. Talk to  your health care provider about getting a sleep evaluation if you snore a lot or have excessive sleepiness. Make sure that any other medical conditions you have, such as atrial fibrillation or atherosclerosis, are managed. Nutrition Follow instructions from your health care provider about what to eat or drink to help manage your health condition. These instructions may include: Reducing your daily calorie intake. Limiting how much salt (sodium) you use to 1,500 milligrams (mg) each day. Using only healthy fats for cooking, such as olive oil, canola oil, or sunflower oil. Eating healthy foods. You can do this by: Choosing foods that are high in fiber, such as whole grains, and fresh fruits and vegetables. Eating at least 5 servings of fruits and vegetables a day. Try to fill one-half of your plate with fruits and vegetables at each meal. Choosing lean protein foods, such as lean cuts of meat, poultry without skin, fish, tofu, beans, and nuts. Eating low-fat dairy products. Avoiding foods that are high in sodium. This can help lower blood pressure. Avoiding foods that have saturated fat, trans fat, and cholesterol. This can help prevent high cholesterol. Avoiding processed and prepared foods. Counting your daily carbohydrate intake.  Lifestyle If you drink alcohol: Limit how much you have to: 0-1 drink a day for women who are not pregnant. 0-2 drinks a day for men. Know how much alcohol is in your drink. In the [place] one drink equals one 12 oz bottle of beer (378m), one 5 oz glass of wine (1449m, or one 1 oz glass of hard liquor (4492m Do not use any products that contain nicotine or tobacco. These products include cigarettes, chewing tobacco, and vaping devices, such as e-cigarettes. If you need help quitting, ask your health care provider. Avoid secondhand smoke. Do not use drugs. Activity  Try to stay at a healthy weight. Get at least 30 minutes of exercise on most days, such  as: Fast walking. Biking. Swimming. Medicines Take over-the-counter and prescription medicines only as told by your health care provider. Aspirin or blood thinners (antiplatelets or anticoagulants) may be recommended to reduce your risk of forming blood clots that can lead to stroke. Avoid taking birth control pills. Talk to your health care provider about the risks of taking birth control pills if: You are over 35 37ars old. You smoke. You get very bad headaches. You have had a blood clot. Where to find more information American Stroke Association: www.strokeassociation.org Get help right away if: You or a loved one has any symptoms of a stroke. "BE FAST" is an easy way to remember the main warning signs of a stroke: B - Balance. Signs are dizziness, sudden trouble walking, or loss of balance. E - Eyes. Signs are trouble seeing or a sudden change in vision. F - Face. Signs are sudden weakness or numbness of the face, or the face or eyelid drooping on one side. A - Arms. Signs are weakness or numbness in an arm. This happens suddenly and usually on one side of the body. S - Speech. Signs are sudden trouble speaking, slurred speech, or trouble understanding what people say. T - Time. Time to call emergency services. Write down what time symptoms started. You or a loved one has other signs of a stroke, such as: A sudden, severe headache with no known cause. Nausea or vomiting. Seizure. These symptoms may represent a serious problem that is an emergency. Do not wait to see if the symptoms will go away. Get medical help right away. Call  your local emergency services (911 in the [place] Do not drive yourself to the hospital. Summary You can help to prevent a stroke by eating healthy, exercising, not smoking, limiting alcohol intake, and managing any medical conditions you may have. Do not use any products that contain nicotine or tobacco. These include cigarettes, chewing tobacco, and vaping  devices, such as e-cigarettes. If you need help quitting, ask your health care provider. Remember "BE FAST" for warning signs of a stroke. Get help right away if you or a loved one has any of these signs. This information is not intended to replace advice given to you by your health care provider. Make sure you discuss any questions you have with your health care provider. Document Revised: 11/26/2019 Document Reviewed: 11/26/2019 Elsevier Patient Education  [place].

## 2022-05-04 NOTE — Progress Notes (Signed)
[facility] [address] [place]. [place] [address] [phone]       OFFICE FOLLOW-UP NOTE  Ms. Dayan C Granderson Date of Birth:  03/06/1946 Medical Record Number:  [id number]   HPI: Ms. Perkin is a 76 year old Caucasian lady seen today for office follow-up visit following hospital admission for stroke.  September 2023.  She is accompanied by her husband.  History is obtained from them and review of electronic medical records and I personally reviewed pertinent available imaging in PACS..Neya C Barlowe is an 76 y.o. female with a PMHx of arthritis, CKD, COPD, Fuch's corneal endothelial dystrophy, HLD, HTN and hypothyroidism who presented on 01/29/2022 to the ED via EMS as a Code Stroke with acute onset of left sided weakness, left facial droop and dysarthria with neglect. Symptom onset was at 38 while she was working out. She was standing up performing an exercise when she felt her left leg give way. She collapsed to the floor and EMS was called. On their arrival, the above symptoms were noted. She was emergently transported to the [facility] ED. On arrival she continued to exhibit the above clinical findings. She has no prior history of stroke.  She was treated with IV thrombolysis with TNK followed by successful mechanical thrombectomy with  TICI 2c ecanalization of the right middle cerebral artery but requiring rescue proximal right ICA stenting.  Patient was kept in the ICU and gradually improved however she continued to have dense left hemiplegia.  She was able to tolerate a diet but had repeated urinary retention requiring indwelling Foley catheter.  She was transferred to inpatient rehab but did not make significant improvement and is currently nursing home.  She is still getting daily physical occupational therapy but can barely stand with a 90% upright posture.  She is unable to take any steps and walk.  She has not made satisfactory progress as per physical therapist.  She still has  significant also has chronic headaches for which she was started on Topamax 50 mg twice daily which she is tolerating well but states he gets daily headaches he is tolerating aspirin and Brilinta.  Heart blood pressure is well-controlled today it is 133/67.  ROS:   14 system review of systems is positive for weakness, difficulty walking, shoulder pain and dislocation all other systems  PMH:  Past Medical History:  Diagnosis Date   Arthritis    Chronic kidney disease    COPD (chronic obstructive pulmonary disease) ([place])    pt denies    Dyspnea    Family history of adverse reaction to anesthesia    father was slow to wake up   Fuch's endothelial dystrophy    GERD (gastroesophageal reflux disease)    Hyperlipidemia    Hypertension    Hypothyroidism    Pneumonia    hx of    PONV (postoperative nausea and vomiting)    slow to wake up and PONV    Social History:  Social History   Socioeconomic History   Marital status: Married    Spouse name: Not on file   Number of children: Not on file   Years of education: 4   Highest education level: Bachelor's degree (e.g., BA, AB, BS)  Occupational History   Occupation: retired  Tobacco Use   Smoking status: Never   Smokeless tobacco: Never  Vaping Use   Vaping Use: Never used  Substance and Sexual Activity   Alcohol use: No    Alcohol/week: 0.0 standard drinks of alcohol  Drug use: No   Sexual activity: Not Currently    Birth control/protection: Post-menopausal  Other Topics Concern   Not on file  Social History Narrative   Not on file   Social Determinants of Health   Financial Resource Strain: Low Risk  (04/08/2021)   Overall Financial Resource Strain (CARDIA)    Difficulty of Paying Living Expenses: Not hard at all  Food Insecurity: No Food Insecurity (02/15/2022)   Hunger Vital Sign    Worried About Running Out of Food in the Last Year: Never true    Ran Out of Food in the Last Year: Never true  Transportation Needs:  No Transportation Needs (02/15/2022)   PRAPARE - Hydrologist (Medical): No    Lack of Transportation (Non-Medical): No  Physical Activity: Insufficiently Active (04/08/2021)   Exercise Vital Sign    Days of Exercise per Week: 4 days    Minutes of Exercise per Session: 30 min  Stress: No Stress Concern Present (04/08/2021)   [place]    Feeling of Stress : Not at all  Social Connections: [place] (04/08/2021)   Social Connection and Isolation Panel [NHANES]    Frequency of Communication with Friends and Family: More than three times a week    Frequency of Social Gatherings with Friends and Family: More than three times a week    Attends Religious Services: More than 4 times per year    Active Member of [employer] or Organizations: Yes    Attends Music therapist: More than 4 times per year    Marital Status: Married  Human resources officer Violence: Not At Risk (02/15/2022)   Humiliation, Afraid, Rape, and Kick questionnaire    Fear of Current or Ex-Partner: No    Emotionally Abused: No    Physically Abused: No    Sexually Abused: No    Medications:   Current Outpatient Medications on File Prior to Visit  Medication Sig Dispense Refill   acetaminophen (TYLENOL) 325 MG tablet Take 2 tablets (650 mg total) by mouth 4 (four) times daily -  with meals and at bedtime.     amLODipine (NORVASC) 5 MG tablet Take 1 tablet (5 mg total) by mouth daily.     aspirin 81 MG chewable tablet Chew 1 tablet (81 mg total) by mouth daily. 90 tablet 0   azelastine (ASTELIN) 0.1 % nasal spray Place 1 spray into both nostrils daily as needed for allergies. Use in each nostril as directed     bethanechol (URECHOLINE) 25 MG tablet Take 1 tablet (25 mg total) by mouth 3 (three) times daily.     butalbital-acetaminophen-caffeine (FIORICET) 50-325-40 MG tablet Take 1 tablet by mouth every 12 (twelve)  hours as needed for headache. 10 tablet 0   diclofenac Sodium (VOLTAREN) 1 % GEL Apply 2 g topically 3 (three) times daily.     EPINEPHrine 0.3 mg/0.3 mL IJ SOAJ injection Inject 0.3 mg into the muscle as needed for anaphylaxis.     estradiol (ESTRACE) 0.1 MG/GM vaginal cream Place 0.5 g vaginally See admin instructions. Insert 0.5 g vaginally 2-3 times weekly as needed for vulvovaginal atrophy     Fexofenadine HCl (ALLEGRA PO) Take 1 tablet by mouth daily as needed (allergies).     food thickener (SIMPLYTHICK, NECTAR/LEVEL 2/MILDLY THICK,) GEL Take 1 packet by mouth as needed.     loratadine (CLARITIN) 10 MG tablet Take 1 tablet (10 mg total) by  mouth daily.     LUTEIN PO Take 1 tablet by mouth daily.     phenol (CHLORASEPTIC) 1.4 % LIQD Use as directed 1 spray in the mouth or throat as needed for throat irritation / pain.  0   ticagrelor (BRILINTA) 90 MG TABS tablet Take 1 tablet (90 mg total) by mouth 2 (two) times daily. 60 tablet 5   topiramate (TOPAMAX) 50 MG tablet Take 1 tablet (50 mg total) by mouth 2 (two) times daily.     traZODone (DESYREL) 50 MG tablet Take 0.5 tablets (25 mg total) by mouth at bedtime as needed for sleep.     levothyroxine (SYNTHROID) 75 MCG tablet Take 1 tablet (75 mcg total) by mouth daily at 6 (six) AM. 30 tablet 0   losartan (COZAAR) 25 MG tablet Take 1 tablet (25 mg total) by mouth daily. 30 tablet 0   rosuvastatin (CRESTOR) 20 MG tablet Take 1 tablet (20 mg total) by mouth daily after supper. 30 tablet 0   No current facility-administered medications on file prior to visit.    Allergies:   Allergies  Allergen Reactions   Colesevelam Nausea And Vomiting    WELCHOL    Levofloxacin Hives   Amoxicillin    Benazepril Other (See Comments)   Clavulanic Acid Other (See Comments)   Methylisothiazolinone Other (See Comments)    Positive on allergy test   Thimerosal (Thiomersal) Other (See Comments)    Positive on allergy test   Amoxicillin-Pot Clavulanate  Nausea Only    Did it involve swelling of the face/tongue/throat, SOB, or low BP? No Did it involve sudden or severe rash/hives, skin peeling, or any reaction on the inside of your mouth or nose? No Did you need to seek medical attention at a hospital or doctor's office? Unknown When did it last happen?      unknown If all above answers are "NO", may proceed with cephalosporin use.    Latex Rash    Family history of latex allergy   Simvastatin Itching    Physical Exam General: Frail elderly Caucasian lady, seated, in no evident distress Head: head normocephalic and atraumatic.  Neck: supple with no carotid or supraclavicular bruits Cardiovascular: regular rate and rhythm, no murmurs Musculoskeletal: no deformity left foot drop ankle brace Skin:  no rash/petichiae Vascular:  Normal pulses all extremities There were no vitals filed for this visit. Neurologic Exam Mental Status: Awake and fully alert. Oriented to place and time. Recent and remote memory intact. Attention span, concentration and fund of knowledge appropriate. Mood and affect appropriate.  Cranial Nerves: Fundoscopic exam reveals sharp disc margins. Pupils equal, briskly reactive to light. Extraocular movements show slight left gaze palsy.. Visual fields show dense left homonymous hemianopsia to confrontation. Hearing intact. Facial sensation intact.  Moderate left lower facial weakness., tongue, palate moves normally and symmetrically.  Motor: Spastic left hemiplegia 1/5 left hand and 2/5 in the left hip.  Left foot drop.  Spasticity on the left side.  Normal strength on the right Sensory.: intact to touch ,pinprick .position and vibratory sensation.  Coordination: Unable to test on the left and normal on the right.   Gait and Station: Patient is unable to stand and walk Reflexes: 2+ and asymmetric and brisker on the left. Toes downgoing.   NIHSS  11 Modified Rankin  4   ASSESSMENT: 76 year old Caucasian lady with right  middle cerebral artery infarct in September 2023 due to right M1 occlusion treated with IV TNK followed by mechanical  thrombectomy with rescue proximal right ICA angioplasty and stenting .     PLAN:I had a long d/w patient and her husband about her recent stroke,spatic hemiplegia, chronic daily headaches, left shoulder pain and dislocation, risk for recurrent stroke/TIAs, personally independently reviewed imaging studies and stroke evaluation results and answered questions.Continue aspirin 81 mg daily and Brilinta 90 mg twice daily for 6 months and then aspirin alone for secondary stroke prevention and maintain strict control of hypertension with blood pressure goal below 130/90, diabetes with hemoglobin A1c goal below 6.5% and lipids with LDL cholesterol goal below 70 mg/dL. I also advised the patient to eat a healthy diet with plenty of whole grains, cereals, fruits and vegetables, exercise regularly and maintain ideal body weight .increase continue ongoing physical occupational therapies and ambulate as tolerated.  Topamax dose to 100 mg twice daily for chronic daily headaches.  Refer to orthopedics for left shoulder pain and dislocation.  Followup in the future with my nurse practitioner in 3 months or call earlier if necessary. Greater than 50% of time during this 41 minute visit was spent on counseling,explanation of diagnosis of MCA stroke, carotid stenosis and stenting and spastic hemiplegia, planning of further management, discussion with patient and family and coordination of care Antony Contras, MD Note: This document was prepared with digital dictation and possible smart phrase technology. Any transcriptional errors that result from this process are unintentional

## 2022-05-04 NOTE — Telephone Encounter (Signed)
Referral for orthopedics fax to [facility]. Phone: [phone]: [phone]

## 2022-05-06 ENCOUNTER — Encounter: Payer: Self-pay | Admitting: Physical Medicine & Rehabilitation

## 2022-05-06 ENCOUNTER — Encounter: Payer: Medicare Other | Attending: Registered Nurse | Admitting: Physical Medicine & Rehabilitation

## 2022-05-06 VITALS — BP 142/82 | HR 77 | Ht 64.0 in

## 2022-05-06 DIAGNOSIS — I639 Cerebral infarction, unspecified: Secondary | ICD-10-CM

## 2022-05-06 DIAGNOSIS — G8110 Spastic hemiplegia affecting unspecified side: Secondary | ICD-10-CM

## 2022-05-06 NOTE — Progress Notes (Signed)
Subjective:    Patient ID: Julia Deleon, female    DOB: 11-02-1945, 76 y.o.   MRN: [id number] 76 y.o. female with history of CKD, HTN, Fuch's corneal endothelial dystrophy, mild Schatzki's ring, chronic LBP with neurogenic B/B who was admitted on 09/22/2 with left sided weakness, left facial droop, dysarthria and left neglect. Julia Deleon was found to have acute occlusion of proximal M1 non-opacification of right M2/M3 segments, severe stenosis > 75% at origing of R-ICA, high grade narrowing of distal BA stenosis. Julia Deleon received TNKase and underwent mechanical thrombectomy with TIC1 revascularization with stent, contrast staining, flow diverters for [facility] by Dr. Tennis Must. [name]. Post procedure hypotension and bradycardia felt to be due to carotid intervention.     Follow up CT head showed multifocal right hemispheric cytotoxic edema with involvement of right inferior frontal gyrus, right basal ganglia and right superior perirolandic cortex with trace IVH/SAH. Julia Deleon tolerated extubation and was started on D3, nectars. Amantadine was added for activation due to somnolence and Topamax titrated to 100 mg bid to help manage HA. Julia Deleon was treated with 7 day course of IV rocephin for pyuria. Julia Deleon has hx of chronic LBP and neurogencic B/B and required I/O caths briefly but was reported to be voiding continently. Therapy was working with patient who continued to be limited by left hemiplegia, left inattention, depressed mood, back and right shoulder pain, delay in processing requiring verbal and tactile cues for one step commands as well as generalized weakness. CIR was recommended due to functional decline.   Admit date: 02/16/2022 Discharge date: 03/04/2022 HPI Patient is in a skilled nursing facility.  Julia Deleon needs assistance for dressing bathing hygiene grooming toileting.  Julia Deleon is doing some standing with therapy but not any significant ambulation. Julia Deleon is accompanied by her husband today.  Julia Deleon is in a transport chair.  Julia Deleon  has a Foley catheter.  Complains of coccyx pain with sitting Pain Inventory Average Pain 7 Pain Right Now 6 My pain is aching  LOCATION OF PAIN  head,shoulder,wrist, hip, thigh, knee, leg  BOWEL Number of stools per week: 7   BLADDER Normal In and out cath, frequency 40 days    Mobility walk with assistance ability to climb steps?  no do you drive?  no needs help with transfers Do you have any goals in this area?  yes  Function retired  Neuro/Psych bowel control problems weakness trouble walking  Prior Studies Any changes since last visit?  no  Physicians involved in your care Any changes since last visit?  no   Family History  Problem Relation Age of Onset   Cancer Mother        lung   CAD Mother    Stroke Father    Breast cancer Sister        30s   Fibromyalgia Sister    Brain cancer Sister    Heart disease Maternal Grandmother    Cancer Paternal Grandmother        throat   Emphysema Paternal Grandfather    Varicose Veins Son    Breast cancer Other    Social History   Socioeconomic History   Marital status: Married    Spouse name: Not on file   Number of children: Not on file   Years of education: 4   Highest education level: Bachelor's degree (e.g., BA, AB, BS)  Occupational History   Occupation: retired  Tobacco Use   Smoking status: Never   Smokeless tobacco: Never  Vaping Use  Vaping Use: Never used  Substance and Sexual Activity   Alcohol use: No    Alcohol/week: 0.0 standard drinks of alcohol   Drug use: No   Sexual activity: Not Currently    Birth control/protection: Post-menopausal  Other Topics Concern   Not on file  Social History Narrative   Not on file   Social Determinants of Health   Financial Resource Strain: Low Risk  (04/08/2021)   Overall Financial Resource Strain (CARDIA)    Difficulty of Paying Living Expenses: Not hard at all  Food Insecurity: No Food Insecurity (02/15/2022)   Hunger Vital Sign    Worried  About Running Out of Food in the Last Year: Never true    Ran Out of Food in the Last Year: Never true  Transportation Needs: No Transportation Needs (02/15/2022)   PRAPARE - Hydrologist (Medical): No    Lack of Transportation (Non-Medical): No  Physical Activity: Insufficiently Active (04/08/2021)   Exercise Vital Sign    Days of Exercise per Week: 4 days    Minutes of Exercise per Session: 30 min  Stress: No Stress Concern Present (04/08/2021)   [place]    Feeling of Stress : Not at all  Social Connections: [place] (04/08/2021)   Social Connection and Isolation Panel [NHANES]    Frequency of Communication with Friends and Family: More than three times a week    Frequency of Social Gatherings with Friends and Family: More than three times a week    Attends Religious Services: More than 4 times per year    Active Member of Clubs or Organizations: Yes    Attends Music therapist: More than 4 times per year    Marital Status: Married   Past Surgical History:  Procedure Laterality Date   ABDOMINAL HYSTERECTOMY     APPENDECTOMY     BACK SURGERY  09/26/2018   L4-5 PLIF by Dr. Arnoldo Morale   CARDIAC CATHETERIZATION     CHOLECYSTECTOMY     COLONOSCOPY WITH PROPOFOL N/A 10/23/2020   Procedure: COLONOSCOPY WITH PROPOFOL;  Surgeon: Virgel Manifold, MD;  Location: [facility] ENDOSCOPY;  Service: Endoscopy;  Laterality: N/A;   ESOPHAGOGASTRODUODENOSCOPY N/A 10/23/2020   Procedure: ESOPHAGOGASTRODUODENOSCOPY (EGD);  Surgeon: Virgel Manifold, MD;  Location: [facility] ENDOSCOPY;  Service: Endoscopy;  Laterality: N/A;   EYE SURGERY     FOOT SURGERY Right    pin removed left   IR CT HEAD LTD  01/29/2022   IR CT HEAD LTD  01/29/2022   IR CT HEAD LTD  01/29/2022   IR INTRA CRAN STENT  01/29/2022   IR INTRAVSC STENT CERV CAROTID W/O EMB-PROT MOD SED INC ANGIO  01/29/2022   IR  PERCUTANEOUS ART THROMBECTOMY/INFUSION INTRACRANIAL INC DIAG ANGIO  01/29/2022   IR US GUIDE VASC ACCESS RIGHT  01/29/2022   laser vein surgery     NASAL SINUS SURGERY     RADIOLOGY WITH ANESTHESIA N/A 01/29/2022   Procedure: IR WITH ANESTHESIA;  Surgeon: Radiologist, Medication, MD;  Location: [place];  Service: Radiology;  Laterality: N/A;   RIGHT/LEFT HEART CATH AND CORONARY ANGIOGRAPHY N/A 12/06/2017   Procedure: RIGHT/LEFT HEART CATH AND CORONARY ANGIOGRAPHY;  Surgeon: Yolonda Kida, MD;  Location: [place] [facility];  Service: Cardiovascular;  Laterality: N/A;   ROBOTIC ASSISTED LAPAROSCOPIC SACROCOLPOPEXY Bilateral 07/30/2020   Procedure: XI ROBOTIC ASSISTED LAPAROSCOPIC SACROCOLPOPEXY AND SUPRACERVICAL HYSTERECTOMY WITH BILATERAL SALPINGO OOPHERECTOMY;  Surgeon: Louis Meckel,  Viona Gilmore, MD;  Location: [facility];  Service: Urology;  Laterality: Bilateral;  REQUESTING 4 HRS   TONSILLECTOMY     Past Medical History:  Diagnosis Date   Arthritis    Chronic kidney disease    COPD (chronic obstructive pulmonary disease) ([place])    pt denies    Dyspnea    Family history of adverse reaction to anesthesia    father was slow to wake up   Fuch's endothelial dystrophy    GERD (gastroesophageal reflux disease)    Hyperlipidemia    Hypertension    Hypothyroidism    Pneumonia    hx of    PONV (postoperative nausea and vomiting)    slow to wake up and PONV   BP (!) 142/82   Pulse 77   Ht '5\' 4"'$  (1.626 m)   LMP  (LMP Unknown)   SpO2 92%   BMI 26.19 kg/m   Opioid Risk Score:   Fall Risk Score:  `1  Depression screen PHQ 2/9     05/06/2022    1:14 PM 04/27/2021   10:26 AM 04/08/2021   10:13 AM 09/24/2020    2:26 PM 04/07/2020    9:50 AM 04/03/2019    8:16 AM 03/30/2018    9:12 AM  Depression screen PHQ 2/9  Decreased Interest 0 0 0 0 0 0 0  Down, Depressed, Hopeless 0 0 0 0 0 0 0  PHQ - 2 Score 0 0 0 0 0 0 0  Altered sleeping 0 0    1   Tired, decreased energy 0 2    2   Change in  appetite 0 0    0   Feeling bad or failure about yourself  0 0    0   Trouble concentrating 0 0    0   Moving slowly or fidgety/restless 0 0    0   Suicidal thoughts 0 0    0   PHQ-9 Score 0 2    3   Difficult doing work/chores Not difficult at all Not difficult at all    Not difficult at all      Review of Systems  Musculoskeletal:        Head,thigh,knee,leg,wrist, hip pain   All other systems reviewed and are negative.      Objective:   Physical Exam Vitals and nursing note reviewed.  Constitutional:      Appearance: Julia Deleon is normal weight.  HENT:     Head: Normocephalic and atraumatic.  Musculoskeletal:     Comments: No tenderness palpation over the ischial tuberosity or over the coccyx. No pain with lower extremity range of motion There is pain with left upper extremity finger and wrist as well as elbow extension.  There is no evidence of synovitis no erythema in the limb joint deformities other than shoulder subluxation  Neurological:     Mental Status: Julia Deleon is alert.           Assessment & Plan:   1.  Left spastic hemiplegia has had minimal neurologic recovery Julia Deleon has left upper extremity shoulder subluxation as well as pain related to decreased range of motion in the wrist finger and elbow flexors.  Would recommend continued PT OT at skilled nursing facility. Has been asked whether the patient has a good prognosis for ambulation.  Given her minimal improvement in terms of left-sided weakness as well as fear of falling and to bear weight on left side I do not think independent  ambulation is a reasonable goal.  We did discuss the need to continue working hard in therapy to improve her overall mobility and reduce pneumonia risk as well as risk of skin breakdown. We discussed pressure relief techniques and have written an order for her therapist at the skilled nursing facility work on this.  Not sure how compliant the patient will be Julia Deleon is fairly resistant to movement. Also  recommend Roho cushion have written an order Patient will follow-up with neurology The patient will follow-up with physician at skilled nursing facility.  PMNR follow-up as needed

## 2022-05-06 NOTE — Progress Notes (Addendum)
Subjective:    Patient ID: Julia Deleon, female    DOB: 03-Oct-1945, 76 y.o.   MRN: [id number] 76 y.o. female with history of CKD, HTN, Fuch's corneal endothelial dystrophy, mild Schatzki's ring, chronic LBP with neurogenic B/B who was admitted on 09/22/2 with left sided weakness, left facial droop, dysarthria and left neglect. She was found to have acute occlusion of proximal M1 non-opacification of right M2/M3 segments, severe stenosis > 75% at origing of R-ICA, high grade narrowing of distal BA stenosis. She received TNKase and underwent mechanical thrombectomy with TIC1 revascularization with stent, contrast staining, flow diverters for [facility] by Dr. Tennis Must. Julia Deleon. Post procedure hypotension and bradycardia felt to be due to carotid intervention.     Follow up CT head showed multifocal right hemispheric cytotoxic edema with involvement of right inferior frontal gyrus, right basal ganglia and right superior perirolandic cortex with trace IVH/SAH. She tolerated extubation and was started on D3, nectars. Amantadine was added for activation due to somnolence and Topamax titrated to 100 mg bid to help manage HA. She was treated with 7 day course of IV rocephin for pyuria. She has hx of chronic LBP and neurogencic B/B and required I/O caths briefly but was reported to be voiding continently. Therapy was working with patient who continued to be limited by left hemiplegia, left inattention, depressed mood, back and right shoulder pain, delay in processing requiring verbal and tactile cues for one step commands as well as generalized weakness. CIR was recommended due to functional decline.   Admit date: 02/16/2022 Discharge date: 03/04/2022 HPI Patient is in a skilled nursing facility.  She needs assistance for dressing bathing hygiene grooming toileting.  She is doing some standing with therapy but not any significant ambulation. She is accompanied by her husband today.  She is in a transport chair.  She  has a Foley catheter.  Complains of coccyx pain with sitting Pain Inventory Average Pain 7 Pain Right Now 6 My pain is aching  LOCATION OF PAIN  head,shoulder,wrist, hip, thigh, knee, leg  BOWEL Number of stools per week: 7   BLADDER Normal In and out cath, frequency 40 days    Mobility walk with assistance ability to climb steps?  no do you drive?  no needs help with transfers Do you have any goals in this area?  yes  Function retired  Neuro/Psych bowel control problems weakness trouble walking  Prior Studies Any changes since last visit?  no  Physicians involved in your care Any changes since last visit?  no   Family History  Problem Relation Age of Onset   Cancer Mother        lung   CAD Mother    Stroke Father    Breast cancer Sister        89s   Fibromyalgia Sister    Brain cancer Sister    Heart disease Maternal Grandmother    Cancer Paternal Grandmother        throat   Emphysema Paternal Grandfather    Varicose Veins Son    Breast cancer Other    Social History   Socioeconomic History   Marital status: Married    Spouse name: Not on file   Number of children: Not on file   Years of education: 4   Highest education level: Bachelor's degree (e.g., BA, AB, BS)  Occupational History   Occupation: retired  Tobacco Use   Smoking status: Never   Smokeless tobacco: Never  Vaping Use  Vaping Use: Never used  Substance and Sexual Activity   Alcohol use: No    Alcohol/week: 0.0 standard drinks of alcohol   Drug use: No   Sexual activity: Not Currently    Birth control/protection: Post-menopausal  Other Topics Concern   Not on file  Social History Narrative   Not on file   Social Determinants of Health   Financial Resource Strain: Low Risk  (04/08/2021)   Overall Financial Resource Strain (CARDIA)    Difficulty of Paying Living Expenses: Not hard at all  Food Insecurity: No Food Insecurity (02/15/2022)   Hunger Vital Sign    Worried  About Running Out of Food in the Last Year: Never true    Ran Out of Food in the Last Year: Never true  Transportation Needs: No Transportation Needs (02/15/2022)   PRAPARE - Hydrologist (Medical): No    Lack of Transportation (Non-Medical): No  Physical Activity: Insufficiently Active (04/08/2021)   Exercise Vital Sign    Days of Exercise per Week: 4 days    Minutes of Exercise per Session: 30 min  Stress: No Stress Concern Present (04/08/2021)   [place]    Feeling of Stress : Not at all  Social Connections: [place] (04/08/2021)   Social Connection and Isolation Panel [NHANES]    Frequency of Communication with Friends and Family: More than three times a week    Frequency of Social Gatherings with Friends and Family: More than three times a week    Attends Religious Services: More than 4 times per year    Active Member of Clubs or Organizations: Yes    Attends Music therapist: More than 4 times per year    Marital Status: Married   Past Surgical History:  Procedure Laterality Date   ABDOMINAL HYSTERECTOMY     APPENDECTOMY     BACK SURGERY  09/26/2018   L4-5 PLIF by Dr. Arnoldo Morale   CARDIAC CATHETERIZATION     CHOLECYSTECTOMY     COLONOSCOPY WITH PROPOFOL N/A 10/23/2020   Procedure: COLONOSCOPY WITH PROPOFOL;  Surgeon: Virgel Manifold, MD;  Location: [facility] ENDOS[facility];  Service: Endoscopy;  Laterality: N/A;   ESOPHAGOGASTRODUODENOSCOPY N/A 10/23/2020   Procedure: ESOPHAGOGASTRODUODENOSCOPY (EGD);  Surgeon: Virgel Manifold, MD;  Location: [facility] ENDOSCOPY;  Service: Endoscopy;  Laterality: N/A;   EYE SURGERY     FOOT SURGERY Right    pin removed left   IR CT HEAD LTD  01/29/2022   IR CT HEAD LTD  01/29/2022   IR CT HEAD LTD  01/29/2022   IR INTRA CRAN STENT  01/29/2022   IR INTRAVSC STENT CERV CAROTID W/O EMB-PROT MOD SED INC ANGIO  01/29/2022   IR  PERCUTANEOUS ART THROMBECTOMY/INFUSION INTRACRANIAL INC DIAG ANGIO  01/29/2022   IR US GUIDE VASC ACCESS RIGHT  01/29/2022   laser vein surgery     NASAL SINUS SURGERY     RADIOLOGY WITH ANESTHESIA N/A 01/29/2022   Procedure: IR WITH ANESTHESIA;  Surgeon: Radiologist, Medication, MD;  Location: [place];  Service: Radiology;  Laterality: N/A;   RIGHT/LEFT HEART CATH AND CORONARY ANGIOGRAPHY N/A 12/06/2017   Procedure: RIGHT/LEFT HEART CATH AND CORONARY ANGIOGRAPHY;  Surgeon: Yolonda Kida, MD;  Location: [place] [facility];  Service: Cardiovascular;  Laterality: N/A;   ROBOTIC ASSISTED LAPAROSCOPIC SACROCOLPOPEXY Bilateral 07/30/2020   Procedure: XI ROBOTIC ASSISTED LAPAROSCOPIC SACROCOLPOPEXY AND SUPRACERVICAL HYSTERECTOMY WITH BILATERAL SALPINGO OOPHERECTOMY;  Surgeon: Louis Meckel,  Viona Gilmore, MD;  Location: [facility];  Service: Urology;  Laterality: Bilateral;  REQUESTING 4 HRS   TONSILLECTOMY     Past Medical History:  Diagnosis Date   Arthritis    Chronic kidney disease    COPD (chronic obstructive pulmonary disease) ([place])    pt denies    Dyspnea    Family history of adverse reaction to anesthesia    father was slow to wake up   Fuch's endothelial dystrophy    GERD (gastroesophageal reflux disease)    Hyperlipidemia    Hypertension    Hypothyroidism    Pneumonia    hx of    PONV (postoperative nausea and vomiting)    slow to wake up and PONV   Ht '5\' 4"'$  (1.626 m)   LMP  (LMP Unknown)   BMI 26.19 kg/m   Opioid Risk Score:   Fall Risk Score:  `1  Depression screen [facility] 2/9     04/27/2021   10:26 AM 04/08/2021   10:13 AM 09/24/2020    2:26 PM 04/07/2020    9:50 AM 04/03/2019    8:16 AM 03/30/2018    9:12 AM 06/06/2017   11:06 AM  Depression screen PHQ 2/9  Decreased Interest 0 0 0 0 0 0 0  Down, Depressed, Hopeless 0 0 0 0 0 0 1  PHQ - 2 Score 0 0 0 0 0 0 1  Altered sleeping 0    1    Tired, decreased energy 2    2    Change in appetite 0    0    Feeling bad or failure  about yourself  0    0    Trouble concentrating 0    0    Moving slowly or fidgety/restless 0    0    Suicidal thoughts 0    0    PHQ-9 Score 2    3    Difficult doing work/chores Not difficult at all    Not difficult at all       Review of Systems  Musculoskeletal:        Head,thigh,knee,leg,wrist, hip pain   All other systems reviewed and are negative.     Objective:   Physical Exam Vitals and nursing note reviewed.  Constitutional:      Appearance: She is normal weight.  HENT:     Head: Normocephalic and atraumatic.  Musculoskeletal:     Comments: No tenderness palpation over the ischial tuberosity or over the coccyx. No pain with lower extremity range of motion There is pain with left upper extremity finger and wrist as well as elbow extension.  There is no evidence of synovitis no erythema in the limb joint deformities other than shoulder subluxation  Neurological:     Mental Status: She is alert.           Assessment & Plan:   1.  Left spastic hemiplegia has had minimal neurologic recovery she has left upper extremity shoulder subluxation as well as pain related to decreased range of motion in the wrist finger and elbow flexors.  Would recommend continued PT OT at skilled nursing facility. Has been asked whether the patient has a good prognosis for ambulation.  Given her minimal improvement in terms of left-sided weakness as well as fear of falling and to bear weight on left side I do not think independent ambulation is a reasonable goal.  We did discuss the need to continue working hard in therapy to  improve her overall mobility and reduce pneumonia risk as well as risk of skin breakdown. We discussed pressure relief techniques and have written an order for her therapist at the skilled nursing facility work on this.  Not sure how compliant the patient will be she is fairly resistant to movement. Also recommend Roho cushion have written an order Patient will follow-up  with neurology The patient will follow-up with physician at skilled nursing facility.  PMNR follow-up as needed

## 2022-05-25 ENCOUNTER — Other Ambulatory Visit: Payer: Self-pay

## 2022-05-25 NOTE — Patient Outreach (Signed)
No telephone outreach to patient, Dr. Leonie Man obtained mRS successfully 05/05/23. MRS=4  [place] Care Management Assistant [phone]

## 2022-07-13 ENCOUNTER — Telehealth: Payer: Self-pay | Admitting: Neurology

## 2022-07-13 NOTE — Telephone Encounter (Signed)
This appears to be the copay for the medication when covered by insurance. What alternatives are available for the patient?

## 2022-07-13 NOTE — Telephone Encounter (Signed)
Pt husband is calling stating he wants to talk to nurse about medication ticagrelor (BRILINTA) 90 MG TABS tablet. Stated he is paying $200.00 a month that he can't afford.

## 2022-07-13 NOTE — Telephone Encounter (Signed)
error 

## 2022-07-14 ENCOUNTER — Other Ambulatory Visit: Payer: Self-pay | Admitting: Neurology

## 2022-07-14 MED ORDER — CLOPIDOGREL BISULFATE 75 MG PO TABS: 75.0000 mg | ORAL_TABLET | Freq: Every day | ORAL | 11 refills | Status: AC

## 2022-07-19 ENCOUNTER — Other Ambulatory Visit: Payer: Self-pay

## 2022-07-19 NOTE — Telephone Encounter (Signed)
I spoke with Julia Deleon and faxed orders to [phone].

## 2022-07-19 NOTE — Telephone Encounter (Signed)
I called the patient's husband, Timmothy Sours and left a voicemail (as per DPR). I informed him of the medication change. In addition, I have sent a mychart message informing of the change.

## 2022-07-19 NOTE — Telephone Encounter (Signed)
Pt's husband, Shelana Cockcroft, instructed by the nurse, Apolonio Schneiders at [facility] medication change order need to be send to [facility] in [place]. Can contact [facility] at [phone].

## 2022-09-21 ENCOUNTER — Ambulatory Visit: Payer: Medicare Other | Admitting: Neurology

## 2023-02-14 ENCOUNTER — Encounter: Payer: Medicare Other | Admitting: Dermatology

## 2023-12-14 NOTE — Telephone Encounter (Signed)
 Julia Deleon

## 2024-06-26 ENCOUNTER — Institutional Professional Consult (permissible substitution): Admitting: Neurology

## 2024-07-10 ENCOUNTER — Institutional Professional Consult (permissible substitution): Admitting: Neurology
